# Patient Record
Sex: Male | Born: 1960 | Race: White | Hispanic: No | Smoking: Never smoker
Health system: Southern US, Community
[De-identification: ages and names within clinical notes are randomized; demographics above are authoritative.]

## PROBLEM LIST (undated history)

## (undated) DIAGNOSIS — G629 Polyneuropathy, unspecified: Secondary | ICD-10-CM

## (undated) DIAGNOSIS — E119 Type 2 diabetes mellitus without complications: Secondary | ICD-10-CM

## (undated) DIAGNOSIS — Z794 Long term (current) use of insulin: Secondary | ICD-10-CM

## (undated) DIAGNOSIS — N189 Chronic kidney disease, unspecified: Secondary | ICD-10-CM

## (undated) DIAGNOSIS — I10 Essential (primary) hypertension: Secondary | ICD-10-CM

## (undated) DIAGNOSIS — I639 Cerebral infarction, unspecified: Secondary | ICD-10-CM

## (undated) DIAGNOSIS — K219 Gastro-esophageal reflux disease without esophagitis: Secondary | ICD-10-CM

## (undated) HISTORY — PX: TONSILLECTOMY: SUR1361

## (undated) HISTORY — PX: APPENDECTOMY: SHX54

## (undated) HISTORY — PX: OTHER SURGICAL HISTORY: SHX169

## (undated) HISTORY — PX: BACK SURGERY: SHX140

---

## 2020-11-04 ENCOUNTER — Encounter (HOSPITAL_COMMUNITY): Payer: Self-pay

## 2020-11-04 ENCOUNTER — Emergency Department (HOSPITAL_COMMUNITY)
Admission: EM | Admit: 2020-11-04 | Discharge: 2020-11-05 | Disposition: A | Discharge status: Home / Self Care | Payer: Self-pay | Attending: Emergency Medicine | Admitting: Emergency Medicine

## 2020-11-04 ENCOUNTER — Other Ambulatory Visit: Payer: Self-pay

## 2020-11-04 DIAGNOSIS — L02214 Cutaneous abscess of groin: Secondary | ICD-10-CM

## 2020-11-04 DIAGNOSIS — E119 Type 2 diabetes mellitus without complications: Secondary | ICD-10-CM | POA: Insufficient documentation

## 2020-11-04 HISTORY — DX: Type 2 diabetes mellitus without complications: E11.9

## 2020-11-04 LAB — CBC
HCT: 44.3 % (ref 39.0–52.0)
Hemoglobin: 14.9 g/dL (ref 13.0–17.0)
MCH: 28.9 pg (ref 26.0–34.0)
MCHC: 33.6 g/dL (ref 30.0–36.0)
MCV: 85.9 fL (ref 80.0–100.0)
Platelets: 303 10*3/uL (ref 150–400)
RBC: 5.16 MIL/uL (ref 4.22–5.81)
RDW: 12.7 % (ref 11.5–15.5)
WBC: 8.6 10*3/uL (ref 4.0–10.5)
nRBC: 0 % (ref 0.0–0.2)

## 2020-11-04 LAB — COMPREHENSIVE METABOLIC PANEL
ALT: 12 U/L (ref 0–44)
AST: 15 U/L (ref 15–41)
Albumin: 3.6 g/dL (ref 3.5–5.0)
Alkaline Phosphatase: 88 U/L (ref 38–126)
Anion gap: 12 (ref 5–15)
BUN: 16 mg/dL (ref 6–20)
CO2: 23 mmol/L (ref 22–32)
Calcium: 9.3 mg/dL (ref 8.9–10.3)
Chloride: 101 mmol/L (ref 98–111)
Creatinine, Ser: 1.15 mg/dL (ref 0.61–1.24)
GFR, Estimated: >60 mL/min (ref >60)
Glucose, Bld: 247 mg/dL — ABNORMAL HIGH (ref 70–99)
Potassium: 4 mmol/L (ref 3.5–5.1)
Sodium: 136 mmol/L (ref 135–145)
Total Bilirubin: 1.5 mg/dL — ABNORMAL HIGH (ref 0.3–1.2)
Total Protein: 7.7 g/dL (ref 6.5–8.1)

## 2020-11-04 LAB — LIPASE, BLOOD: Lipase: 22 U/L (ref 11–51)

## 2020-11-04 LAB — CBG MONITORING, ED
Glucose-Capillary: 233 mg/dL — ABNORMAL HIGH (ref 70–99)
Glucose-Capillary: 229 mg/dL — ABNORMAL HIGH (ref 70–99)

## 2020-11-04 MED ORDER — OXYCODONE-ACETAMINOPHEN 5-325 MG PO TABS
1.0000 | ORAL_TABLET | Freq: Once | ORAL | Status: AC
  Administered 2020-11-04: 1 via ORAL
  Filled 2020-11-04: qty 1

## 2020-11-04 NOTE — ED Triage Notes (Signed)
Pt reports right sided groin pain for the past 2 days, states he thinks he has a hernia. Denies n/v/d.

## 2020-11-05 ENCOUNTER — Other Ambulatory Visit: Payer: Self-pay

## 2020-11-05 ENCOUNTER — Emergency Department (HOSPITAL_COMMUNITY): Payer: Self-pay

## 2020-11-05 LAB — URINALYSIS, ROUTINE W REFLEX MICROSCOPIC
Bacteria, UA: NONE SEEN
Bilirubin Urine: NEGATIVE
Glucose, UA: >=500 mg/dL — AB
Hgb urine dipstick: NEGATIVE
Ketones, ur: 20 mg/dL — AB
Leukocytes,Ua: NEGATIVE
Nitrite: NEGATIVE
Protein, ur: 100 mg/dL — AB
Specific Gravity, Urine: 1.023 (ref 1.005–1.030)
pH: 5 (ref 5.0–8.0)

## 2020-11-05 IMAGING — CT CT PELVIS W/ CM
2 of 3 series · 16 of 46 positions shown, 18 images · IV contrast (Omni 300)
Comparison: None.

CLINICAL DATA: Right groin pain and swelling.

EXAM:
CT PELVIS WITH CONTRAST
TECHNIQUE: Multidetector CT imaging of the pelvis was performed using the
standard protocol following the bolus administration of intravenous
contrast.
CONTRAST:  100mL OMNIPAQUE IOHEXOL 300 MG/ML  SOLN

[Series 3: pelvis with 5.0 · axial · 0.79mm/px · z∈[+845,+1115]mm · 13 of 62 slices shown, 15 images]
[im 4/62  soft-tissue]
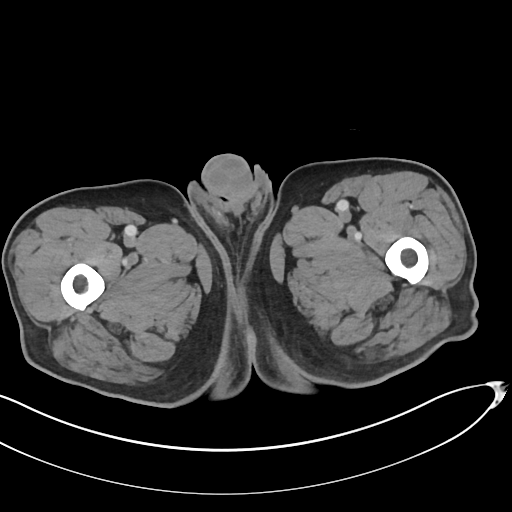
[im 4/62  bone]
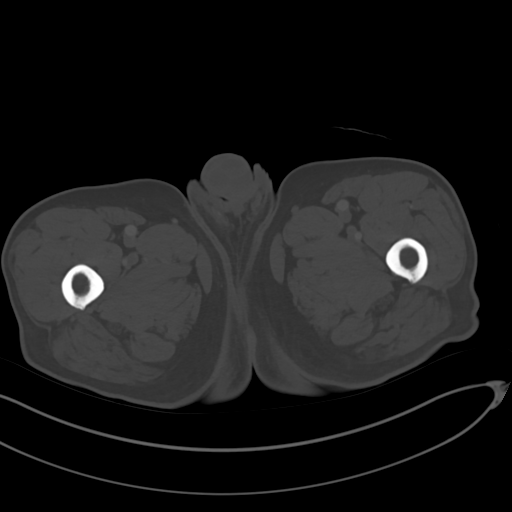
[im 8/62  soft-tissue]
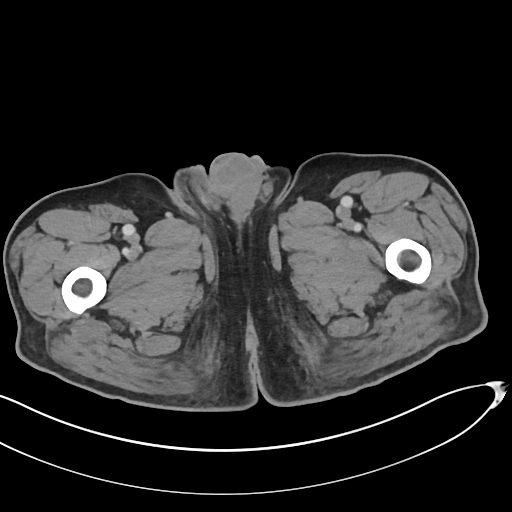
[im 12/62  soft-tissue]
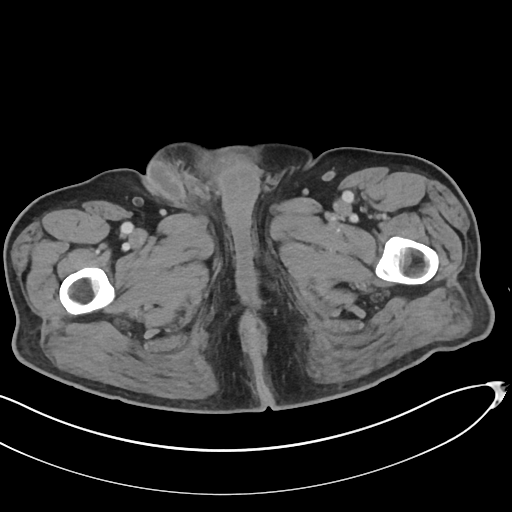
[im 18/62  soft-tissue]
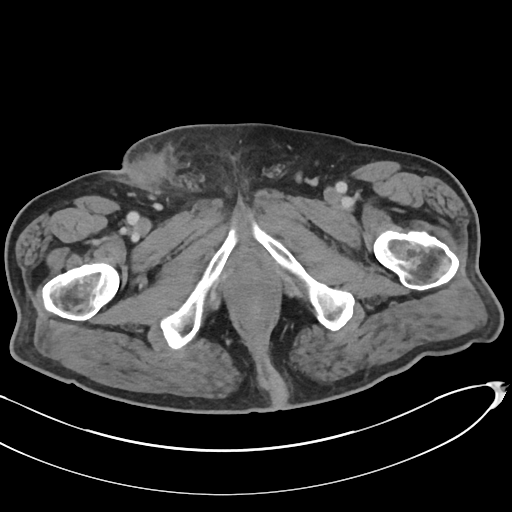
[im 22/62  soft-tissue]
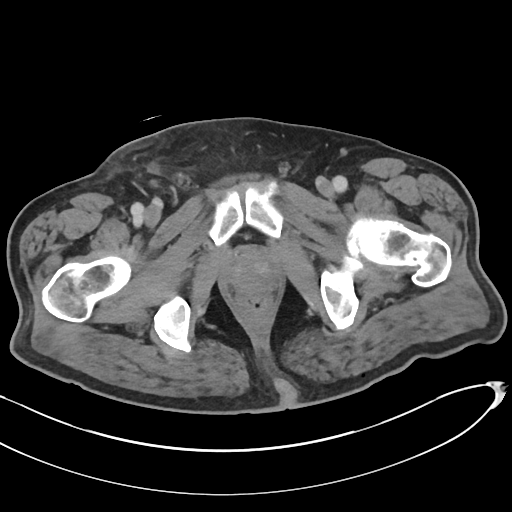
[im 26/62  soft-tissue]
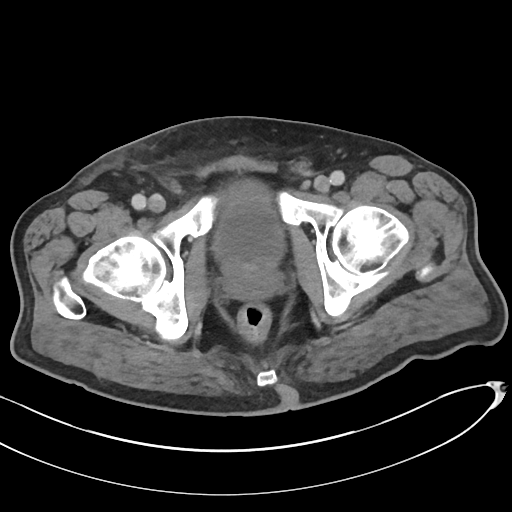
[im 32/62  soft-tissue]
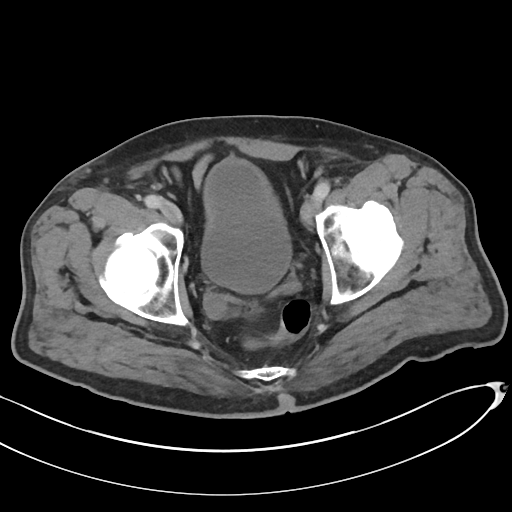
[im 36/62  soft-tissue]
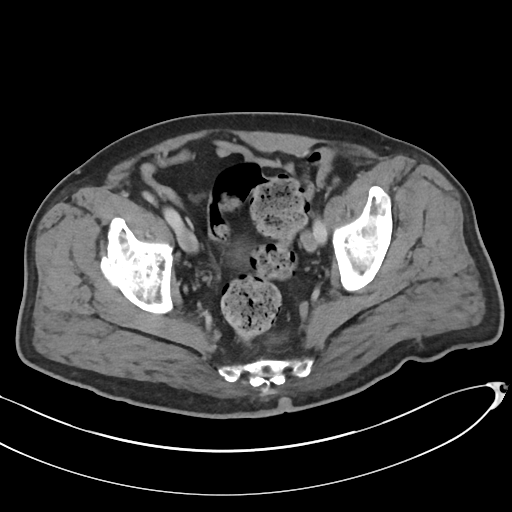
[im 40/62  soft-tissue]
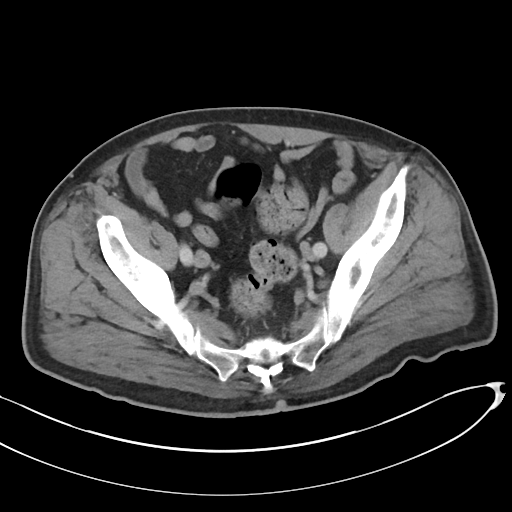
[im 40/62  bone]
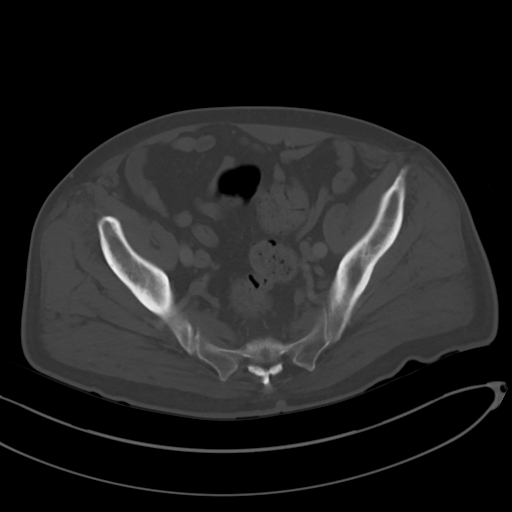
[im 44/62  soft-tissue]
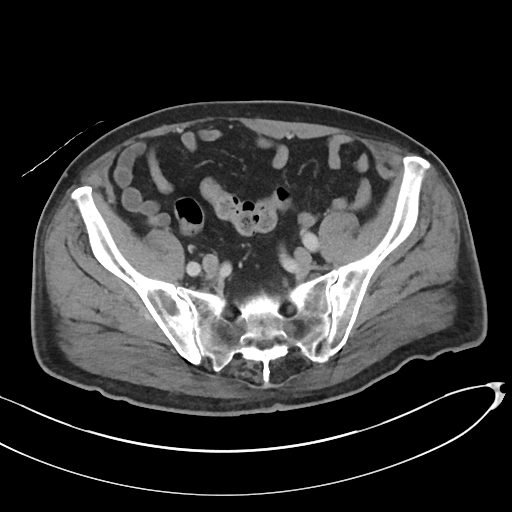
[im 50/62  soft-tissue]
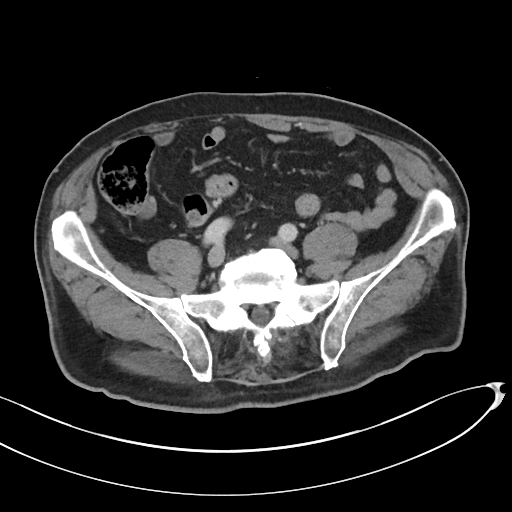
[im 54/62  soft-tissue]
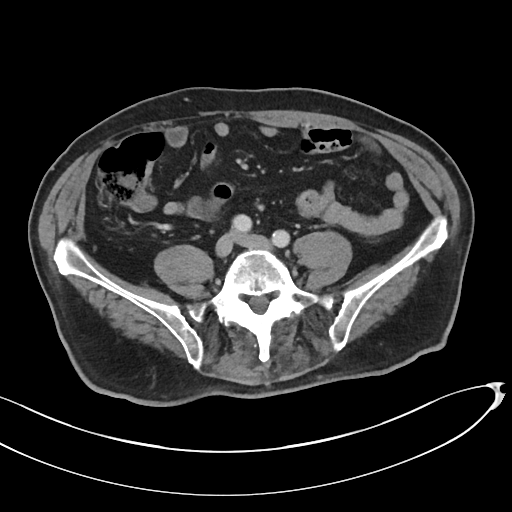
[im 58/62  soft-tissue]
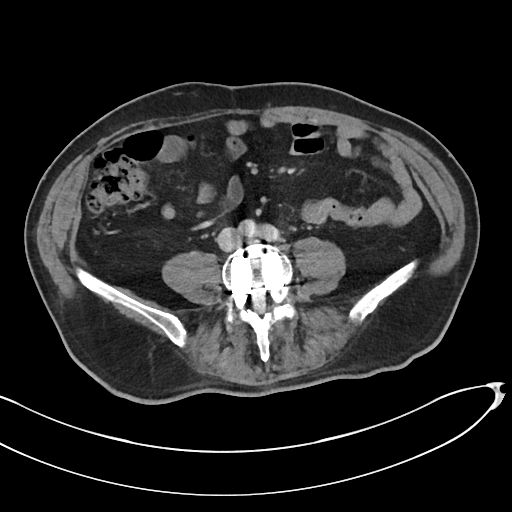

[Series 5: pelvis with 2.0 cor · coronal · 0.84mm/px · 3 of 151 slices shown]
[im 67/151  soft-tissue]
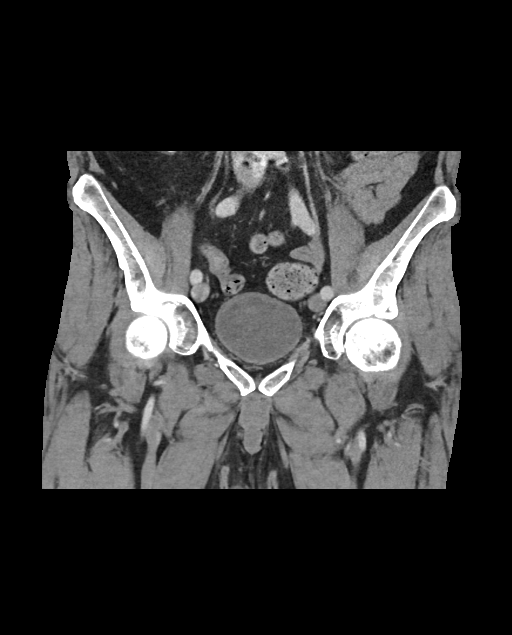
[im 84/151  soft-tissue]
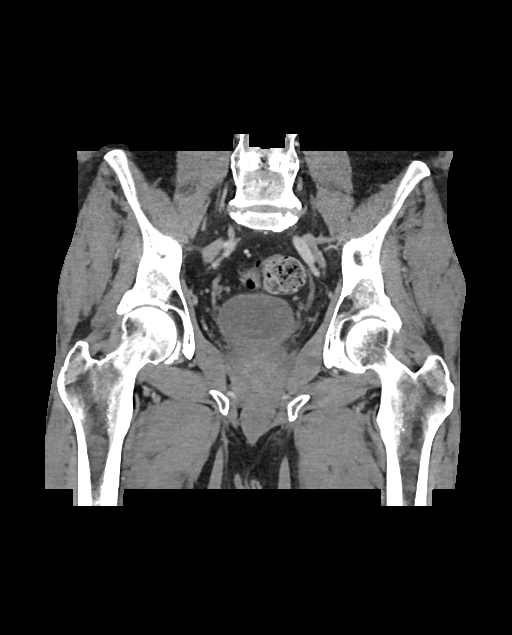
[im 101/151  soft-tissue]
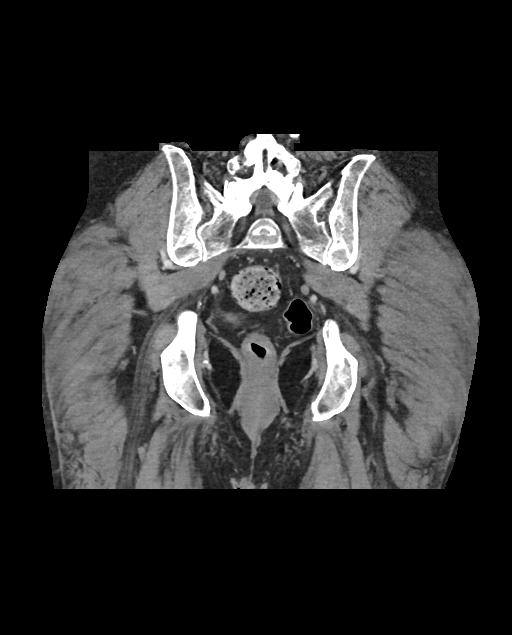

[16 of 46 positions shown; findings below may reference images not displayed]

FINDINGS: Urinary Tract:  No abnormality visualized.

Bowel:  Unremarkable visualized pelvic bowel loops.

Vascular/Lymphatic: No pathologically enlarged lymph nodes. No
significant vascular abnormality seen.

Reproductive:  No mass or other significant abnormality

Other: 3.9 x 3 x 5.4 cm complex peripherally enhancing fluid
collection in the right inguinal subcutaneous fat with severe
surrounding inflammatory changes most consistent with an abscess. No
inguinal hernia. No other fluid collection. No ascites.

Musculoskeletal: No acute osseous abnormality. No aggressive osseous
lesion. Interbody fusion at L4-5 with fusion of the posterior
elements. Degenerative disease with disc height loss at L5-S1 with
bilateral facet arthropathy and foraminal narrowing. Mild
osteoarthritis of bilateral hips.
IMPRESSION: 3.9 x 3 x 5.4 cm complex peripherally enhancing fluid collection in
the right inguinal subcutaneous fat with severe surrounding
inflammatory changes most consistent with an abscess and cellulitis.

## 2020-11-05 MED ORDER — HYDROCODONE-ACETAMINOPHEN 5-325 MG PO TABS
1.0000 | ORAL_TABLET | Freq: Four times a day (QID) | ORAL | 0 refills | Status: DC | PRN
Start: 2020-11-05 — End: 2021-02-03

## 2020-11-05 MED ORDER — LIDOCAINE-EPINEPHRINE 1 %-1:100000 IJ SOLN
10.0000 mL | Freq: Once | INTRAMUSCULAR | Status: AC
  Administered 2020-11-05: 10 mL
  Filled 2020-11-05: qty 1

## 2020-11-05 MED ORDER — DOXYCYCLINE HYCLATE 100 MG PO CAPS: 100.0000 mg | ORAL_CAPSULE | Freq: Two times a day (BID) | ORAL | 0 refills | Status: DC

## 2020-11-05 MED ORDER — IOHEXOL 300 MG/ML  SOLN
100.0000 mL | Freq: Once | INTRAMUSCULAR | Status: AC | PRN
  Administered 2020-11-05: 100 mL via INTRAVENOUS

## 2020-11-05 MED ORDER — HYDROMORPHONE HCL 1 MG/ML IJ SOLN
1.0000 mg | Freq: Once | INTRAMUSCULAR | Status: AC
  Administered 2020-11-05: 1 mg via INTRAMUSCULAR
  Filled 2020-11-05: qty 1

## 2020-11-05 MED ORDER — ONDANSETRON HCL 4 MG/2ML IJ SOLN
4.0000 mg | Freq: Once | INTRAMUSCULAR | Status: AC
  Administered 2020-11-05: 4 mg via INTRAVENOUS
  Filled 2020-11-05: qty 2

## 2020-11-05 MED ORDER — HYDROCODONE-ACETAMINOPHEN 5-325 MG PO TABS
1.0000 | ORAL_TABLET | Freq: Once | ORAL | Status: AC
  Administered 2020-11-05: 1 via ORAL
  Filled 2020-11-05: qty 1

## 2020-11-05 MED ORDER — HYDROMORPHONE HCL 1 MG/ML IJ SOLN
1.0000 mg | Freq: Once | INTRAMUSCULAR | Status: AC
  Administered 2020-11-05: 1 mg via INTRAVENOUS
  Filled 2020-11-05: qty 1

## 2020-11-05 MED ORDER — IBUPROFEN 600 MG PO TABS: 600.0000 mg | ORAL_TABLET | Freq: Four times a day (QID) | ORAL | 0 refills | Status: DC | PRN

## 2020-11-05 NOTE — Discharge Instructions (Addendum)
You were seen in the emergency department for a skin abscess- please see the attached handout for further information regarding this diagnoses. This area was incised and drained to help release the bacteria. We would like you to apply warm compresses and warm flushes to this area 4-5 times per day to help facilitate further draining as needed.   Medications: Doxycycline (antibiotic) - take twice daily for 7 days, complete entire course even if symptoms are resolved  To treat pain you may use ibuprofen 600 mg every 6 hours and Norco for breakthrough pain, this is a narcotic pain medication please use with caution.  We have prescribed you new medication(s) today. Discuss the medications prescribed today with your pharmacist as they can have adverse effects and interactions with your other medicines including over the counter and prescribed medications. Seek medical evaluation if you start to experience new or abnormal symptoms after taking one of these medicines, seek care immediately if you start to experience difficulty breathing, feeling of your throat closing, facial swelling, or rash as these could be indications of a more serious allergic reaction  We would like you to have this area rechecked within 48 hours- please return to the ER , go to an urgent care, or see your primary care provider for this. Return to the ER sooner for new or worsening symptoms including, but not limited to increased pain, spreading redness, fevers, inability to keep fluids down, or any other concerns that you may have.

## 2020-11-05 NOTE — ED Provider Notes (Signed)
[facility] EMERGENCY DEPARTMENT Provider Note   CSN: [id number] Arrival date & time: 11/04/20  1356     History Chief Complaint  Patient presents with  . Groin Pain    Brett White is a 60 y.o. male.  Brett White is a 60 y.o. male with hx of diabetes and stroke, who presents with 3 days of worsening right groin pain and swelling.  Patient reports he noticed a small in the right groin that has become progressively more swollen and painful.  Reports he had 2 episodes of vomiting 3 days ago but none since then.  Last bowel movement was 24 hours ago, and was normal, he has been able to pass some gas.  Denies any dysuria or urinary frequency.  No pain or swelling in the penis or testicles.  No fevers.  No chest pain or shortness of breath.  Patient does report one started had a "cyst" that had to be opened up and drained with packing in similar area and this feels somewhat similar to that.  He has not noted any drainage from this area.  Previous appendectomy, no other abdominal surgeries.  No medications prior to arrival.  No other aggravating or alleviating factors.         Past Medical History:  Diagnosis Date  . Diabetes mellitus without complication (HCC)     There are no problems to display for this patient.   Past Surgical History:  Procedure Laterality Date  . APPENDECTOMY    . BACK SURGERY    . TONSILLECTOMY         No family history on file.     Home Medications Prior to Admission medications   Not on File    Allergies    Patient has no allergy information on record.  Review of Systems   Review of Systems  Constitutional: Negative for chills and fever.  HENT: Negative.   Respiratory: Negative for cough and shortness of breath.   Cardiovascular: Negative for chest pain.  Gastrointestinal: Positive for nausea and vomiting. Negative for abdominal pain, blood in stool, constipation and diarrhea.  Genitourinary: Negative for dysuria,  flank pain, frequency, hematuria, penile discharge, penile pain, penile swelling, scrotal swelling and testicular pain.       R groin pain  Musculoskeletal: Negative for arthralgias and myalgias.  Skin: Negative for color change and rash.  Neurological: Negative for dizziness, syncope and light-headedness.  All other systems reviewed and are negative.   Physical Exam Updated Vital Signs BP (!) 152/97 (BP Location: Right Arm)   Pulse 86   Temp 98.2 F (36.8 C) (Oral)   Resp 16   SpO2 98%   Physical Exam Vitals and nursing note reviewed. Exam conducted with a chaperone present.  Constitutional:      General: He is not in acute distress.    Appearance: Normal appearance. He is well-developed and well-nourished. He is not ill-appearing or diaphoretic.     Comments: Patient is alert, appears uncomfortable but is in no acute distress.  HENT:     Head: Normocephalic and atraumatic.     Mouth/Throat:     Mouth: Oropharynx is clear and moist. Mucous membranes are moist.     Pharynx: Oropharynx is clear.  Eyes:     General:        Right eye: No discharge.        Left eye: No discharge.     Extraocular Movements: EOM normal.  Cardiovascular:     Rate  and Rhythm: Normal rate and regular rhythm.     Pulses: Normal pulses and intact distal pulses.     Heart sounds: Normal heart sounds. No murmur heard. No friction rub. No gallop.   Pulmonary:     Effort: Pulmonary effort is normal. No respiratory distress.     Breath sounds: Normal breath sounds. No wheezing or rales.     Comments: Respirations equal and unlabored, patient able to speak in full sentences, lungs clear to auscultation bilaterally  Abdominal:     General: Bowel sounds are normal. There is no distension.     Palpations: Abdomen is soft. There is no mass.     Tenderness: There is no abdominal tenderness. There is no guarding.     Comments: Abdomen soft, nondistended, nontender to palpation in all quadrants without guarding  or peritoneal signs  Genitourinary:    Penis: Normal.      Testes:        Right: Mass, tenderness or swelling not present.        Left: Mass, tenderness or swelling not present.       Comments: Penis and testes normal with no swelling, tenderness or masses.  Right groin pain and swelling as noted above.  No palpable lymphadenopathy. Musculoskeletal:        General: No deformity or edema.     Cervical back: Neck supple.  Skin:    General: Skin is warm and dry.     Capillary Refill: Capillary refill takes less than 2 seconds.  Neurological:     Mental Status: He is alert.     Coordination: Coordination normal.     Comments: Speech is clear, able to follow commands Moves extremities without ataxia, coordination intact  Psychiatric:        Mood and Affect: Mood normal.        Behavior: Behavior normal.     ED Results / Procedures / Treatments   Labs (all labs ordered are listed, but only abnormal results are displayed) Labs Reviewed  COMPREHENSIVE METABOLIC PANEL - Abnormal; Notable for the following components:      Result Value   Glucose, Bld 247 (*)    Total Bilirubin 1.5 (*)    All other components within normal limits  CBG MONITORING, ED - Abnormal; Notable for the following components:   Glucose-Capillary 233 (*)    All other components within normal limits  CBG MONITORING, ED - Abnormal; Notable for the following components:   Glucose-Capillary 229 (*)    All other components within normal limits  LIPASE, BLOOD  CBC  URINALYSIS, ROUTINE W REFLEX MICROSCOPIC    EKG None  Radiology CT PELVIS W CONTRAST  Result Date: 11/05/2020 CLINICAL DATA:  Right groin pain and swelling. EXAM: CT PELVIS WITH CONTRAST TECHNIQUE: Multidetector CT imaging of the pelvis was performed using the standard protocol following the bolus administration of intravenous contrast. CONTRAST:  OMNIPAQUE IOHEXOL 300 MG/ML  SOLN COMPARISON:  None. FINDINGS: Urinary Tract:  No abnormality  visualized. Bowel:  Unremarkable visualized pelvic bowel loops. Vascular/Lymphatic: No pathologically enlarged lymph nodes. No significant vascular abnormality seen. Reproductive:  No mass or other significant abnormality Other: 3.9 x 3 x 5.4 cm complex peripherally enhancing fluid collection in the right inguinal subcutaneous fat with severe surrounding inflammatory changes most consistent with an abscess. No inguinal hernia. No other fluid collection. No ascites. Musculoskeletal: No acute osseous abnormality. No aggressive osseous lesion. Interbody fusion at L4-5 with fusion of the posterior elements. Degenerative disease  with disc height loss at L5-S1 with bilateral facet arthropathy and foraminal narrowing. Mild osteoarthritis of bilateral hips. IMPRESSION: 3.9 x 3 x 5.4 cm complex peripherally enhancing fluid collection in the right inguinal subcutaneous fat with severe surrounding inflammatory changes most consistent with an abscess and cellulitis. Electronically Signed   By: Elige Ko   On: 11/05/2020 13:00    Procedures .Marland KitchenIncision and Drainage  Date/Time: 11/05/2020 1:48 PM Performed by: Dartha Lodge, PA-C Authorized by: Dartha Lodge, PA-C   Consent:    Consent obtained:  Verbal   Consent given by:  Patient   Risks, benefits, and alternatives were discussed: yes     Risks discussed:  Bleeding, incomplete drainage, pain, infection and damage to other organs   Alternatives discussed:  No treatment Universal protocol:    Procedure explained and questions answered to patient or proxy's satisfaction: yes     Patient identity confirmed:  Verbally with patient Location:    Type:  Abscess   Size:  3.9 x 3 x 5.4 cm   Location:  Anogenital   Anogenital location: R groin. Pre-procedure details:    Skin preparation:  Povidone-iodine Sedation:    Sedation type:  None Anesthesia:    Anesthesia method:  Local infiltration   Local anesthetic:  Lidocaine 2% WITH epi Procedure type:     Complexity:  Complex Procedure details:    Ultrasound guidance: no     Needle aspiration: no     Incision types:  Single straight   Incision depth:  Subcutaneous   Wound management:  Probed and deloculated and irrigated with saline   Drainage:  Bloody and purulent   Drainage amount:  Copious   Wound treatment:  Wound left open   Packing materials:  1/4 in iodoform gauze Post-procedure details:    Procedure completion:  Tolerated well, no immediate complications     Medications Ordered in ED Medications  oxyCODONE-acetaminophen (PERCOCET/ROXICET) 5-325 MG per tablet 1 tablet (1 tablet Oral Given 11/04/20 1420)  HYDROmorphone (DILAUDID) injection 1 mg (1 mg Intravenous Given 11/05/20 1017)  ondansetron (ZOFRAN) injection 4 mg (4 mg Intravenous Given 11/05/20 1017)  HYDROmorphone (DILAUDID) injection 1 mg (1 mg Intramuscular Given 11/05/20 1130)    ED Course  I have reviewed the triage vital signs and the nursing notes.  Pertinent labs & imaging results that were available during my care of the patient were reviewed by me and considered in my medical decision making (see chart for details).    MDM Rules/Calculators/A&P                         60 year old male presents with right groin pain and swelling, present and worsening for 3 days.  No reported fevers at home, low-grade temp on arrival, hypertensive but vitals otherwise stable.  He has swelling with small area of redness present in the right groin, this area is very tender to palpation, concern for potential hernia, abscess or soft tissue mass.  No involvement of the penis or scrotum.  No associated abdominal tenderness.  Bedside ultrasound without clear evidence of localized fluid collection amenable to I&D, will get CT scan of the pelvis for further evaluation.  Pain and nausea medication given for symptomatic management.  Lab work obtained from triage which I have personally reviewed and interpreted: CBC: No leukocytosis, normal  hemoglobin CMP: Glucose 247 but no other electrolyte derangements, normal renal and liver function with the exception of mildly elevatedT bili, no  prior available for comparison Lipase: WNL UA pending  CT shows a 3.9 x 3 x 5.4 cm complex peripherally enhancing fluid collection in the right inguinal subcutaneous fat with severe surrounding inflammatory changes most consistent with an abscess and cellulitis.   Feel that abscess is amenable to incision and drainage here in the ED.  The area was cleaned and anesthetized and a straight incision was made, copious amounts of bloody purulent drainage expressed, the area was probed and loculated and rinsed with saline. 1/4 inch iodoform gauze placed for packing given size of abscess.  Given findings of cellulitis on CT as well as patient's diabetes history, will also treat with antibiotics, given first dose of doxycycline here in the ED and prescribed 1 week course.  We will have patient return in 2 days for wound check and packing removal.  Encourage warm soaks and discussed appropriate wound care and return precautions.  Patient expresses understanding and agreement with plan.  Discharged home in good condition.   Final Clinical Impression(s) / ED Diagnoses Final diagnoses:  Abscess of groin, right    Rx / DC Orders ED Discharge Orders         Ordered    doxycycline (VIBRAMYCIN) 100 MG capsule  2 times daily        11/05/20 1347    ibuprofen (ADVIL) 600 MG tablet  Every 6 hours PRN        11/05/20 1347    HYDROcodone-acetaminophen (NORCO) 5-325 MG tablet  Every 6 hours PRN        11/05/20 1347           Dartha Lodge, PA-C 11/05/20 1352    Rolan Bucco, MD 11/05/20 1436

## 2020-11-05 NOTE — ED Notes (Signed)
Ask pt for urine sample, pt stated he did not need to urinate at this time.  

## 2020-11-05 NOTE — ED Notes (Signed)
Patient transported to CT 

## 2020-11-08 ENCOUNTER — Emergency Department (HOSPITAL_COMMUNITY)
Admission: EM | Admit: 2020-11-08 | Discharge: 2020-11-08 | Disposition: A | Discharge status: Home / Self Care | Payer: Self-pay | Attending: Emergency Medicine | Admitting: Emergency Medicine

## 2020-11-08 ENCOUNTER — Other Ambulatory Visit: Payer: Self-pay

## 2020-11-08 ENCOUNTER — Encounter (HOSPITAL_COMMUNITY): Payer: Self-pay | Admitting: Emergency Medicine

## 2020-11-08 DIAGNOSIS — Z4801 Encounter for change or removal of surgical wound dressing: Secondary | ICD-10-CM | POA: Insufficient documentation

## 2020-11-08 DIAGNOSIS — Z5189 Encounter for other specified aftercare: Secondary | ICD-10-CM

## 2020-11-08 DIAGNOSIS — Z7984 Long term (current) use of oral hypoglycemic drugs: Secondary | ICD-10-CM | POA: Insufficient documentation

## 2020-11-08 DIAGNOSIS — L02214 Cutaneous abscess of groin: Secondary | ICD-10-CM | POA: Insufficient documentation

## 2020-11-08 DIAGNOSIS — E119 Type 2 diabetes mellitus without complications: Secondary | ICD-10-CM | POA: Insufficient documentation

## 2020-11-08 LAB — CBC WITH DIFFERENTIAL/PLATELET
Abs Immature Granulocytes: 0.02 10*3/uL (ref 0.00–0.07)
Basophils Absolute: 0 10*3/uL (ref 0.0–0.1)
Basophils Relative: 0 %
Eosinophils Absolute: 0.1 10*3/uL (ref 0.0–0.5)
Eosinophils Relative: 1 %
HCT: 43.8 % (ref 39.0–52.0)
Hemoglobin: 14.4 g/dL (ref 13.0–17.0)
Immature Granulocytes: 0 %
Lymphocytes Relative: 24 %
Lymphs Abs: 1.4 10*3/uL (ref 0.7–4.0)
MCH: 28.7 pg (ref 26.0–34.0)
MCHC: 32.9 g/dL (ref 30.0–36.0)
MCV: 87.4 fL (ref 80.0–100.0)
Monocytes Absolute: 0.3 10*3/uL (ref 0.1–1.0)
Monocytes Relative: 5 %
Neutro Abs: 4.2 10*3/uL (ref 1.7–7.7)
Neutrophils Relative %: 70 %
Platelets: 281 10*3/uL (ref 150–400)
RBC: 5.01 MIL/uL (ref 4.22–5.81)
RDW: 12.3 % (ref 11.5–15.5)
WBC: 6 10*3/uL (ref 4.0–10.5)
nRBC: 0 % (ref 0.0–0.2)

## 2020-11-08 LAB — BASIC METABOLIC PANEL
Anion gap: 13 (ref 5–15)
BUN: 8 mg/dL (ref 6–20)
CO2: 22 mmol/L (ref 22–32)
Calcium: 8.7 mg/dL — ABNORMAL LOW (ref 8.9–10.3)
Chloride: 100 mmol/L (ref 98–111)
Creatinine, Ser: 0.99 mg/dL (ref 0.61–1.24)
GFR, Estimated: >60 mL/min (ref >60)
Glucose, Bld: 396 mg/dL — ABNORMAL HIGH (ref 70–99)
Potassium: 3.8 mmol/L (ref 3.5–5.1)
Sodium: 135 mmol/L (ref 135–145)

## 2020-11-08 MED ORDER — HYDROMORPHONE HCL 1 MG/ML IJ SOLN
1.0000 mg | Freq: Once | INTRAMUSCULAR | Status: AC
  Administered 2020-11-08: 1 mg via INTRAVENOUS
  Filled 2020-11-08: qty 1

## 2020-11-08 MED ORDER — ONDANSETRON HCL 4 MG/2ML IJ SOLN
4.0000 mg | Freq: Once | INTRAMUSCULAR | Status: AC
  Administered 2020-11-08: 4 mg via INTRAVENOUS
  Filled 2020-11-08: qty 2

## 2020-11-08 MED ORDER — OXYCODONE-ACETAMINOPHEN 5-325 MG PO TABS: 1.0000 | ORAL_TABLET | Freq: Four times a day (QID) | ORAL | 0 refills | Status: DC | PRN

## 2020-11-08 NOTE — ED Notes (Signed)
Pt removed him self from monitor states provider told him he will be dc soon.

## 2020-11-08 NOTE — ED Triage Notes (Signed)
Pt was seen here on 2/4 for a abscess on right side of his groin that he states was lanced and packed. He was started on an antibiotic. The packing as fallen out and he is having increased pain and swelling to the area.

## 2020-11-08 NOTE — ED Provider Notes (Signed)
[facility] EMERGENCY DEPARTMENT Provider Note   CSN: [id number] Arrival date & time: 11/08/20  1433     History Chief Complaint  Patient presents with  . Wound Check    Brett White is a 60 y.o. male past medical history of type 2 diabetes, presenting to the emergency department with worsening pain and swelling to abscess in right groin. Per review of medical record, he was evaluated on 11/05/2020 where he had abscess drained and packed. He was started on doxycycline. He had CT abdomen pelvis done at the time which showed 4 x 3 x 5.5 cm abscess with severe cellulitis in the right groin. He was evaluated by his PCP today for recheck and sent to the ED for further evaluation. He states the pain has significantly worsened, long with the swelling. It is still draining. The packing fell out last night. He is compliant with the antibiotics, has not missed any doses. The Norco prescribed is not providing adequate pain relief. Denies fevers or chills. Reports history of abscess on his right shoulder that required admission for 1 month duration and surgical drainage. He feels similar.  Denies history of IV drug use or HIV.  The history is provided by the patient and medical records.       Past Medical History:  Diagnosis Date  . Diabetes mellitus without complication (HCC)     There are no problems to display for this patient.   Past Surgical History:  Procedure Laterality Date  . APPENDECTOMY    . BACK SURGERY    . TONSILLECTOMY         No family history on file.     Home Medications Prior to Admission medications   Medication Sig Start Date End Date Taking? Authorizing Provider  oxyCODONE-acetaminophen (PERCOCET/ROXICET) 5-325 MG tablet Take 1-2 tablets by mouth every 6 (six) hours as needed for severe pain. 11/08/20  Yes Roxan Hockey, [place] N, PA-C  diphenhydramine-acetaminophen (TYLENOL PM) 25-500 MG TABS tablet Take 2 tablets by mouth at bedtime as needed.     [provider]  doxycycline (VIBRAMYCIN) 100 MG capsule Take 1 capsule (100 mg total) by mouth 2 (two) times daily. One po bid x 7 days 11/05/20   Dartha Lodge, PA-C  HYDROcodone-acetaminophen [facility]) 5-325 MG tablet Take 1-2 tablets by mouth every 6 (six) hours as needed. 11/05/20   Dartha Lodge, PA-C  ibuprofen (ADVIL) 600 MG tablet Take 1 tablet (600 mg total) by mouth every 6 (six) hours as needed. 11/05/20   Dartha Lodge, PA-C  metFORMIN (GLUCOPHAGE) 500 MG tablet Take 1,000 mg by mouth 2 (two) times daily with a meal.    [provider]    Allergies    Patient has no known allergies.  Review of Systems   Review of Systems  Constitutional: Negative for chills and fever.  Genitourinary: Positive for scrotal swelling. Negative for testicular pain.  Skin: Positive for color change and wound.    Physical Exam Updated Vital Signs BP (!) 176/104   Pulse 88   Temp 98 F (36.7 C) (Oral)   Resp 16   Ht 6\' 3"  (1.905 m)   Wt 97.5 kg   SpO2 96%   BMI 26.87 kg/m   Physical Exam Vitals and nursing note reviewed.  Constitutional:      Appearance: He is well-developed and well-nourished.  HENT:     Head: Normocephalic and atraumatic.  Eyes:     Conjunctiva/sclera: Conjunctivae normal.  Cardiovascular:  Rate and Rhythm: Normal rate and regular rhythm.  Pulmonary:     Effort: Pulmonary effort is normal.     Breath sounds: Normal breath sounds.  Abdominal:     General: Bowel sounds are normal.     Palpations: Abdomen is soft.     Tenderness: There is no abdominal tenderness.  Genitourinary:    Comments: Exam performed with male RN chaperone present.  Right groin with draining abscess present.  The incision still appears open.  The abscess is about 3 cm in diameter.  Mild surrounding swelling, no surrounding signs of induration.  Swelling does not extend into the thigh or the scrotum. Skin:    General: Skin is warm.  Neurological:     Mental Status: He is  alert.  Psychiatric:        Mood and Affect: Mood and affect normal.        Behavior: Behavior normal.      ED Results / Procedures / Treatments   Labs (all labs ordered are listed, but only abnormal results are displayed) Labs Reviewed  BASIC METABOLIC PANEL - Abnormal; Notable for the following components:      Result Value   Glucose, Bld 396 (*)    Calcium 8.7 (*)    All other components within normal limits  CBC WITH DIFFERENTIAL/PLATELET    EKG None  Radiology No results found.  Procedures Procedures   Medications Ordered in ED Medications  HYDROmorphone (DILAUDID) injection 1 mg (1 mg Intravenous Given 11/08/20 1649)  ondansetron (ZOFRAN) injection 4 mg (4 mg Intravenous Given 11/08/20 1649)    ED Course  I have reviewed the triage vital signs and the nursing notes.  Pertinent labs & imaging results that were available during my care of the patient were reviewed by me and considered in my medical decision making (see chart for details).    MDM Rules/Calculators/A&P                          Patient is here for wound recheck of right groin abscess that was drained in the ED on 09/04/2021.  Packing was placed at the time, he states it fell out yesterday.  He has been compliant with doxycycline.  No systemic symptoms.  He is afebrile here without leukocytosis.  Initial provider, Gwinda Passe, was able to reevaluate the abscess today in the ED and reports significant improvement since initial evaluation a few days ago.  Patient may require some additional pain medication for symptom management.  Recommend continue warm water soaks and compresses, continue antibiotic, recheck by PCP.  Return for systemic symptoms.  Patient discussed with and evaluated by attending physician Dr. Bernette Mayers, who agrees with work-up and care plan for discharge.  Discussed results, findings, treatment and follow up. Patient advised of return precautions. Patient verbalized understanding and agreed  with plan.  [place] Controlled Substance reporting System queried  Final Clinical Impression(s) / ED Diagnoses Final diagnoses:  Wound check, abscess    Rx / DC Orders ED Discharge Orders         Ordered    oxyCODONE-acetaminophen (PERCOCET/ROXICET) 5-325 MG tablet  Every 6 hours PRN        11/08/20 1838           Vera Furniss, [place] N, PA-C 11/08/20 Randel Pigg, MD 11/08/20 [phone]

## 2020-11-08 NOTE — Discharge Instructions (Addendum)
Continue taking the antibiotic, as directed, until gone. You can take the percocet every 6 hours as needed for severe pain. Continue taking the ibuprofen every 6 hours. Do warm water soaks a couple of times per day to encourage continued drainage of your abscess. Follow up with primary care in 2 days. Return if fevers or worsening swelling/redness.

## 2020-11-25 ENCOUNTER — Encounter (HOSPITAL_COMMUNITY): Payer: Self-pay | Admitting: *Deleted

## 2020-11-25 ENCOUNTER — Other Ambulatory Visit: Payer: Self-pay

## 2020-11-25 ENCOUNTER — Emergency Department (HOSPITAL_COMMUNITY): Payer: Medicare HMO

## 2020-11-25 ENCOUNTER — Emergency Department (HOSPITAL_COMMUNITY)
Admission: EM | Admit: 2020-11-25 | Discharge: 2020-11-26 | Disposition: A | Discharge status: Home / Self Care | Payer: Medicare HMO | Attending: Emergency Medicine | Admitting: Emergency Medicine

## 2020-11-25 DIAGNOSIS — E1165 Type 2 diabetes mellitus with hyperglycemia: Secondary | ICD-10-CM | POA: Insufficient documentation

## 2020-11-25 DIAGNOSIS — R519 Headache, unspecified: Secondary | ICD-10-CM | POA: Diagnosis not present

## 2020-11-25 DIAGNOSIS — R55 Syncope and collapse: Secondary | ICD-10-CM | POA: Diagnosis present

## 2020-11-25 DIAGNOSIS — Y92002 Bathroom of unspecified non-institutional (private) residence single-family (private) house as the place of occurrence of the external cause: Secondary | ICD-10-CM | POA: Insufficient documentation

## 2020-11-25 DIAGNOSIS — M25531 Pain in right wrist: Secondary | ICD-10-CM | POA: Diagnosis not present

## 2020-11-25 DIAGNOSIS — R030 Elevated blood-pressure reading, without diagnosis of hypertension: Secondary | ICD-10-CM | POA: Diagnosis not present

## 2020-11-25 DIAGNOSIS — Z7984 Long term (current) use of oral hypoglycemic drugs: Secondary | ICD-10-CM | POA: Insufficient documentation

## 2020-11-25 DIAGNOSIS — E860 Dehydration: Secondary | ICD-10-CM | POA: Diagnosis not present

## 2020-11-25 DIAGNOSIS — W19XXXA Unspecified fall, initial encounter: Secondary | ICD-10-CM | POA: Insufficient documentation

## 2020-11-25 DIAGNOSIS — R040 Epistaxis: Secondary | ICD-10-CM | POA: Insufficient documentation

## 2020-11-25 LAB — URINALYSIS, ROUTINE W REFLEX MICROSCOPIC
Bacteria, UA: NONE SEEN
Bilirubin Urine: NEGATIVE
Glucose, UA: >=500 mg/dL — AB
Hgb urine dipstick: NEGATIVE
Ketones, ur: NEGATIVE mg/dL
Leukocytes,Ua: NEGATIVE
Nitrite: NEGATIVE
Protein, ur: NEGATIVE mg/dL
Specific Gravity, Urine: 1.029 (ref 1.005–1.030)
pH: 5 (ref 5.0–8.0)

## 2020-11-25 LAB — COMPREHENSIVE METABOLIC PANEL WITH GFR
ALT: 10 U/L (ref 0–44)
AST: 14 U/L — ABNORMAL LOW (ref 15–41)
Albumin: 3.7 g/dL (ref 3.5–5.0)
Alkaline Phosphatase: 95 U/L (ref 38–126)
Anion gap: 11 (ref 5–15)
BUN: 18 mg/dL (ref 6–20)
CO2: 24 mmol/L (ref 22–32)
Calcium: 9.2 mg/dL (ref 8.9–10.3)
Chloride: 97 mmol/L — ABNORMAL LOW (ref 98–111)
Creatinine, Ser: 1.3 mg/dL — ABNORMAL HIGH (ref 0.61–1.24)
GFR, Estimated: >60 mL/min
Glucose, Bld: 573 mg/dL (ref 70–99)
Potassium: 4.7 mmol/L (ref 3.5–5.1)
Sodium: 132 mmol/L — ABNORMAL LOW (ref 135–145)
Total Bilirubin: 0.5 mg/dL (ref 0.3–1.2)
Total Protein: 7.3 g/dL (ref 6.5–8.1)

## 2020-11-25 LAB — CBC
HCT: 46.5 % (ref 39.0–52.0)
Hemoglobin: 15 g/dL (ref 13.0–17.0)
MCH: 28.1 pg (ref 26.0–34.0)
MCHC: 32.3 g/dL (ref 30.0–36.0)
MCV: 87.1 fL (ref 80.0–100.0)
Platelets: 276 K/uL (ref 150–400)
RBC: 5.34 MIL/uL (ref 4.22–5.81)
RDW: 12.1 % (ref 11.5–15.5)
WBC: 6.6 K/uL (ref 4.0–10.5)
nRBC: 0 % (ref 0.0–0.2)

## 2020-11-25 LAB — CBG MONITORING, ED
Glucose-Capillary: 546 mg/dL (ref 70–99)
Glucose-Capillary: 349 mg/dL — ABNORMAL HIGH (ref 70–99)

## 2020-11-25 IMAGING — DX DG WRIST COMPLETE 3+V*R*
4 series · 4 of 4 positions shown · non-contrast
Comparison: None.

CLINICAL DATA: Fall wrist pain

EXAM:
RIGHT WRIST - COMPLETE 3+ VIEW

[wrist pa]
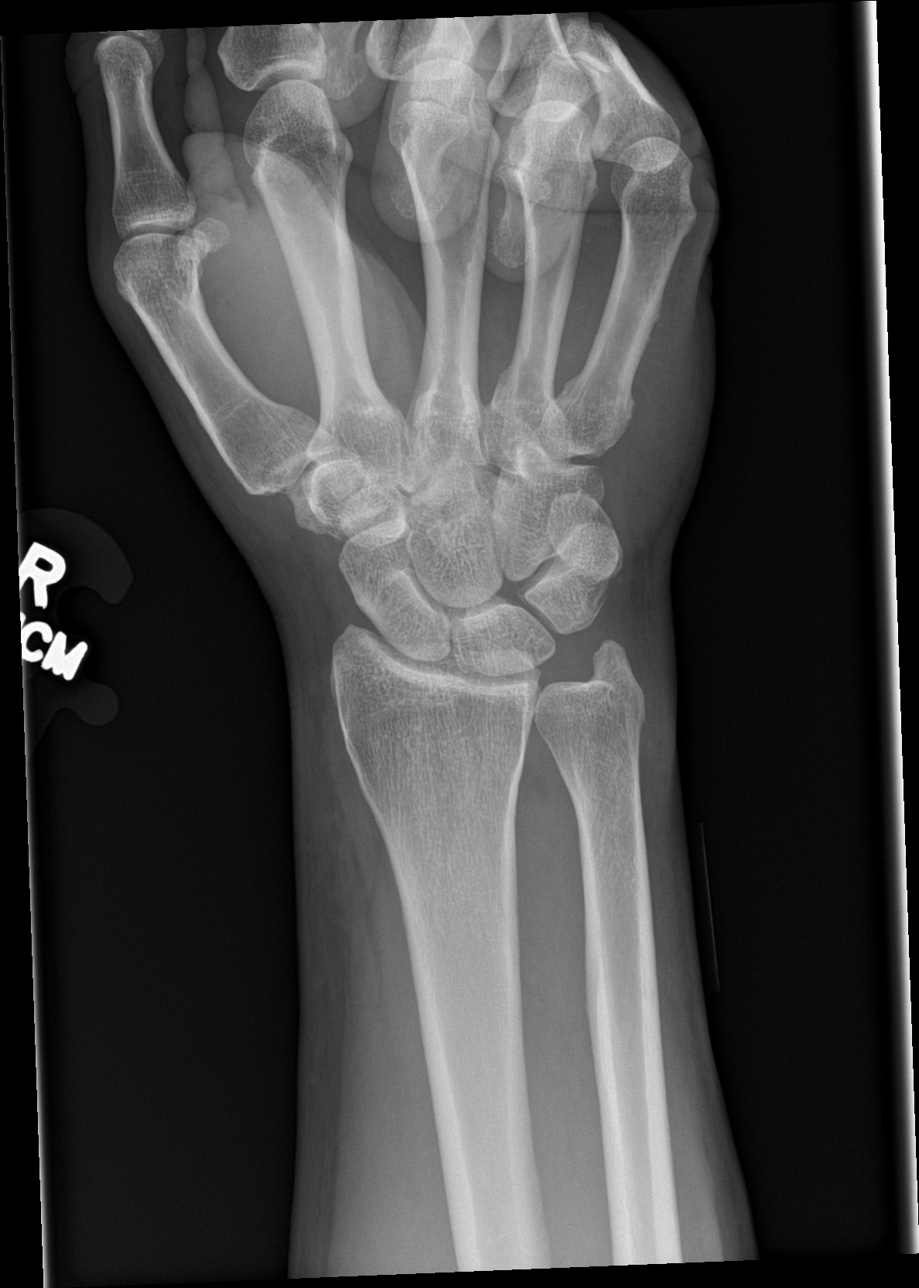

[wrist obl]
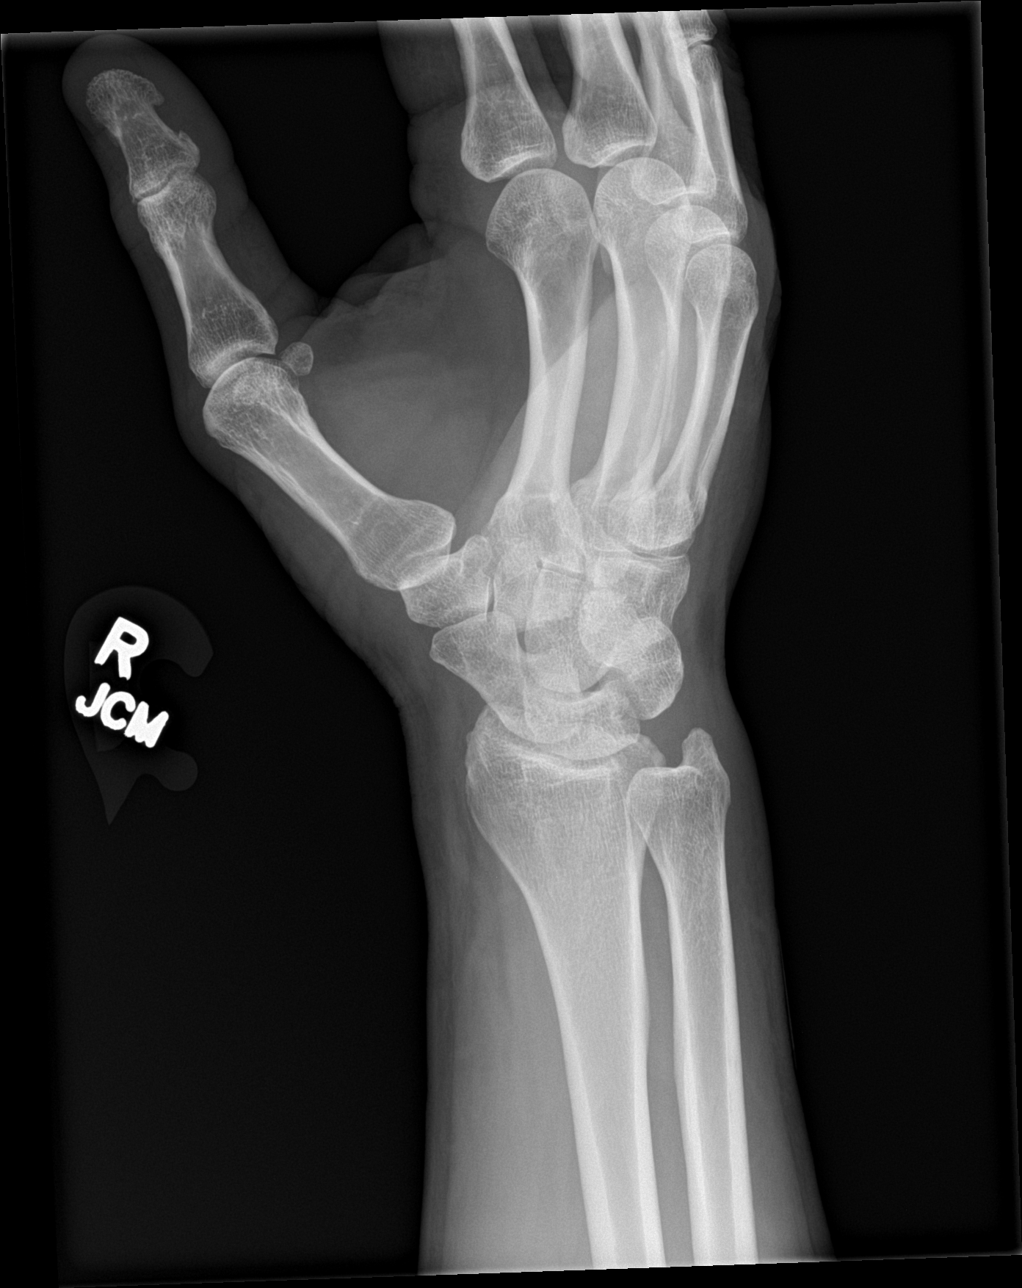

[wrist lat]
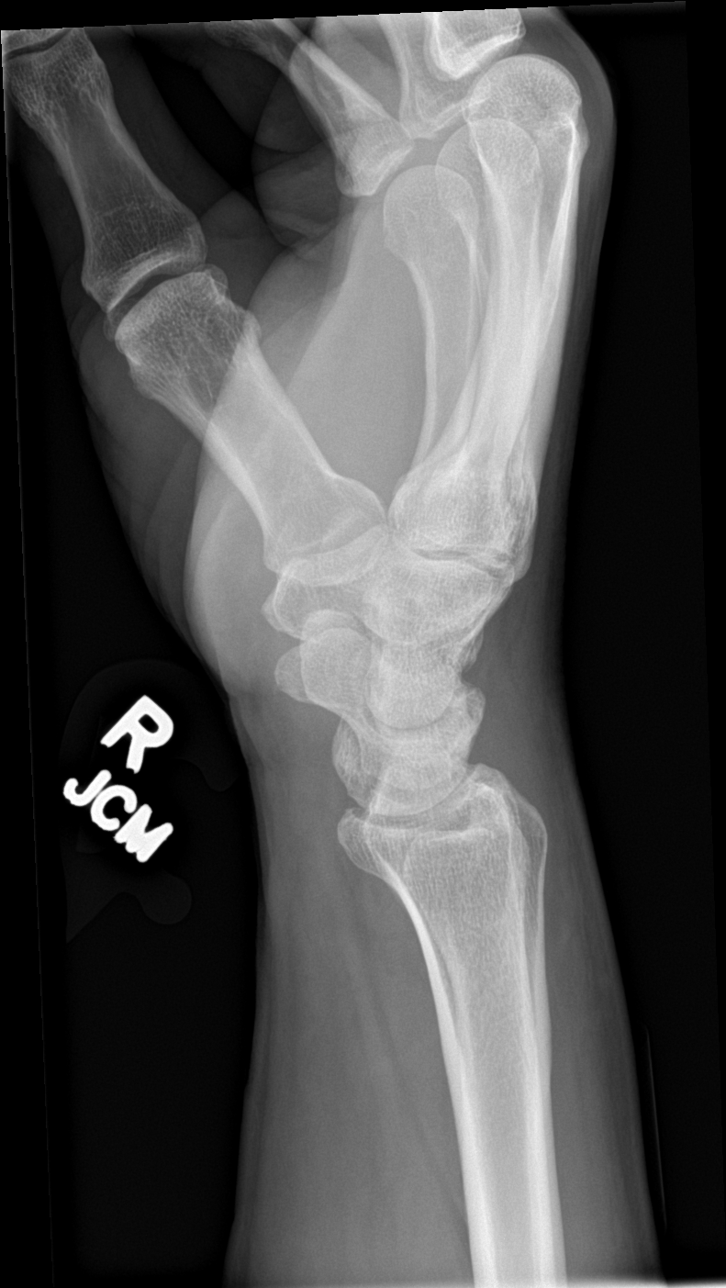

[wrist navicular]
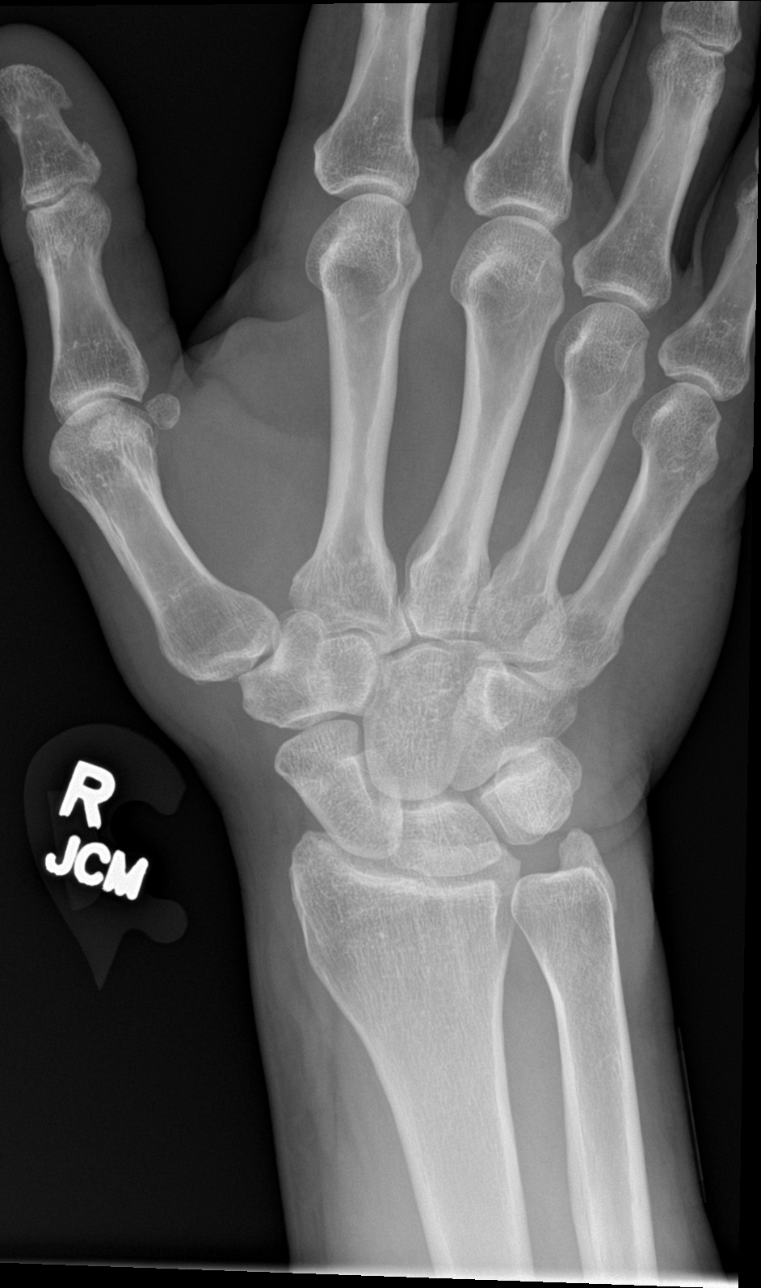

[4 of 4 positions shown; findings below may reference images not displayed]

FINDINGS: There is no evidence of fracture or dislocation. There is no
evidence of arthropathy or other focal bone abnormality. Dorsal soft
tissue swelling.
IMPRESSION: Negative.

## 2020-11-25 IMAGING — CT CT HEAD W/O CM
3 of 4 series · 14 of 47 positions shown, 16 images · non-contrast
Comparison: None.

CLINICAL DATA: house for epistaxis, normal mental status. states
fall with LOC this am after leaving bathroom

EXAM:
CT HEAD WITHOUT CONTRAST
TECHNIQUE: Contiguous axial images were obtained from the base of the skull
through the vertex without intravenous contrast.

[Series 4: head 2.0 h70h · axial · 0.44mm/px · z∈[+91,+235]mm · 8 of 90 slices shown, 10 images]
[im 9/90  brain]
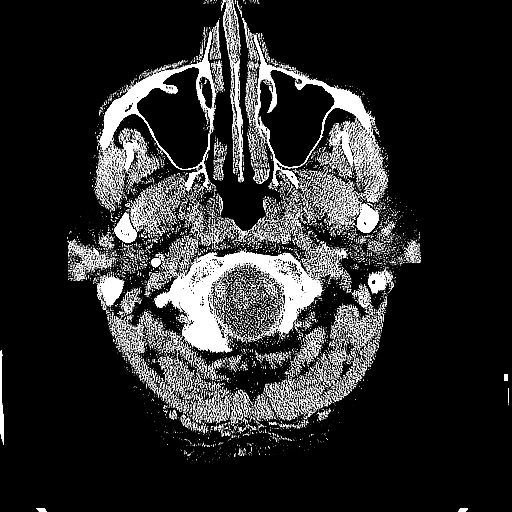
[im 9/90  bone]
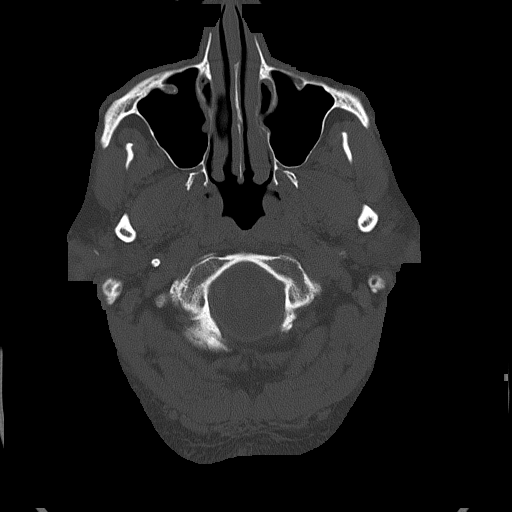
[im 17/90  brain]
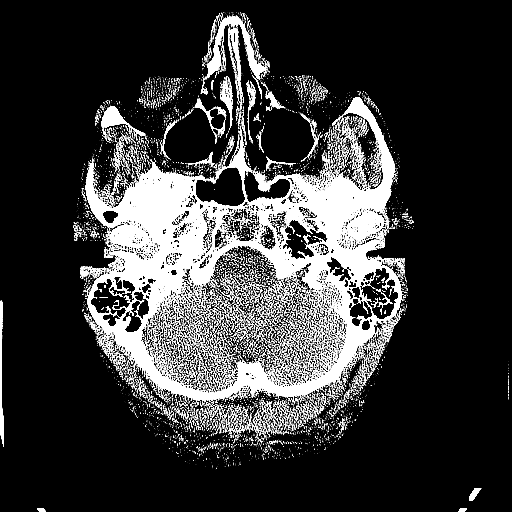
[im 30/90  brain]
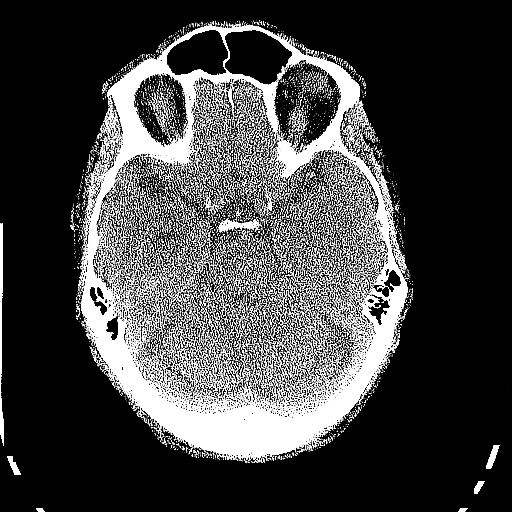
[im 39/90  brain]
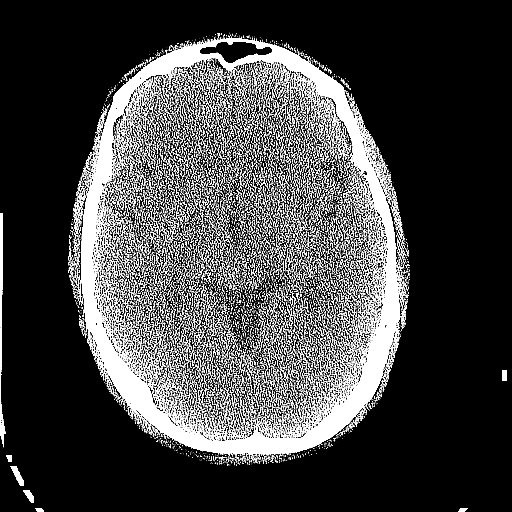
[im 51/90  brain]
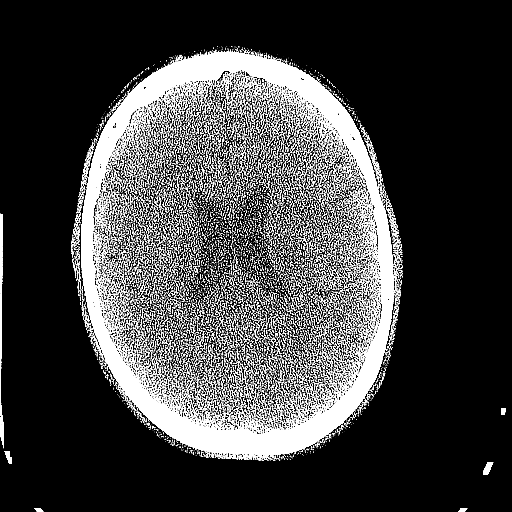
[im 51/90  bone]
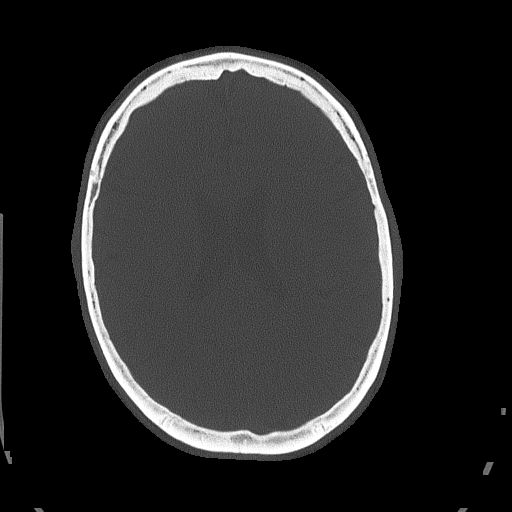
[im 60/90  brain]
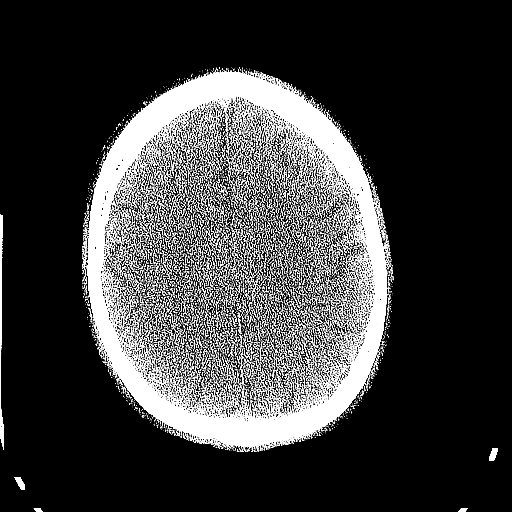
[im 73/90  brain]
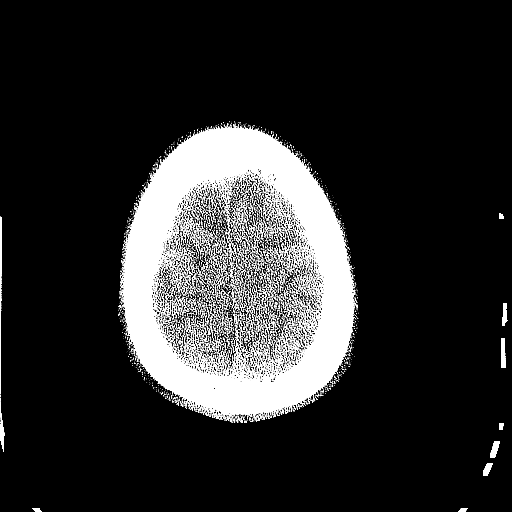
[im 81/90  brain]
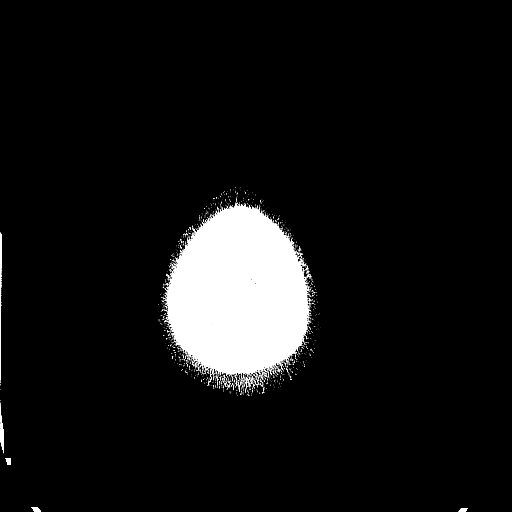

[Series 5: head 3.0 mpr cor · coronal · 0.35mm/px · 3 of 84 slices shown]
[im 28/84  brain]
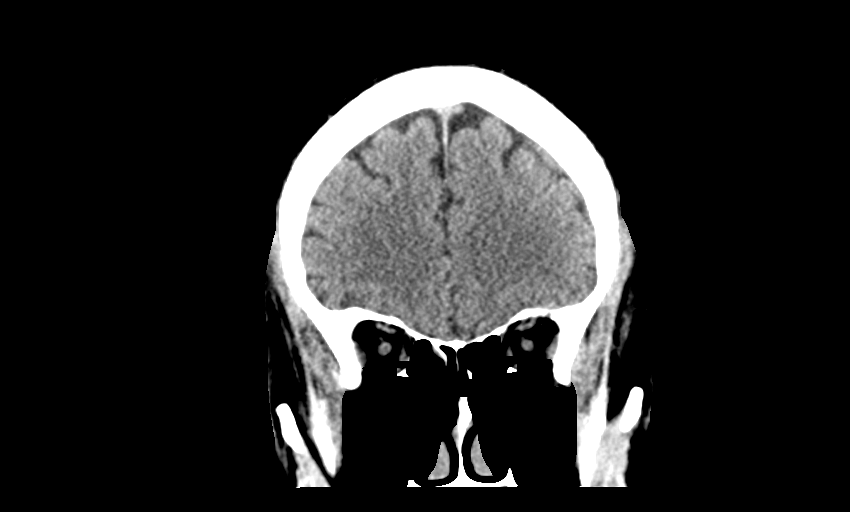
[im 37/84  brain]
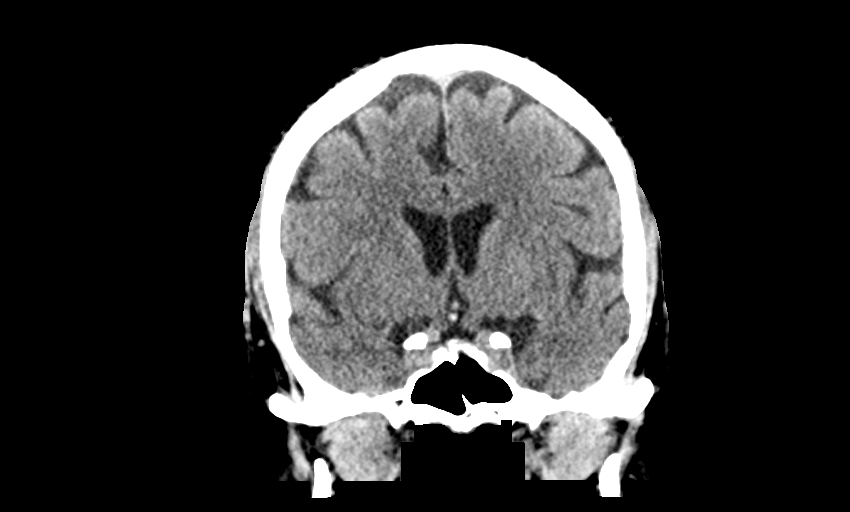
[im 47/84  brain]
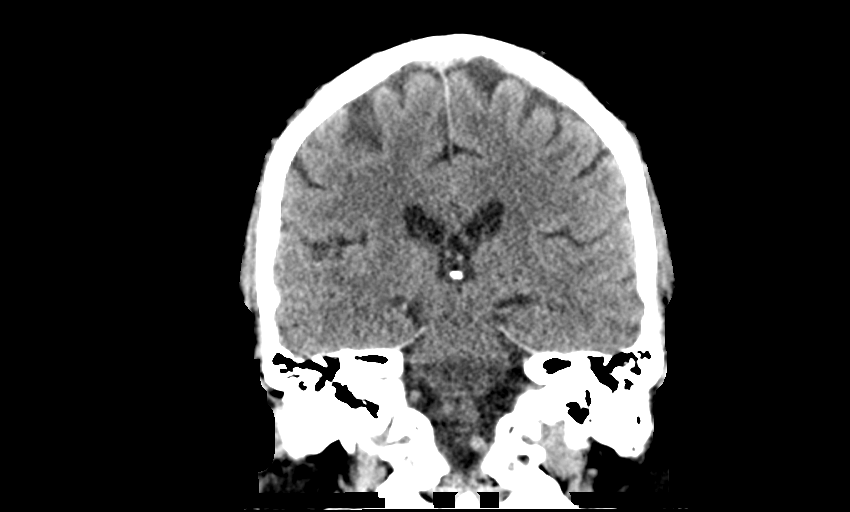

[Series 6: head 3.0 mpr sag · sagittal · 0.35mm/px · 3 of 67 slices shown]
[im 23/67  brain]
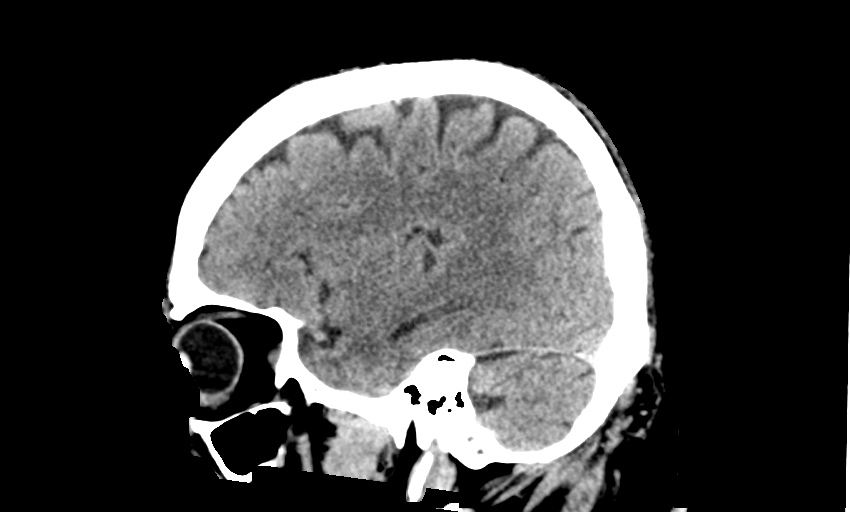
[im 34/67  brain]
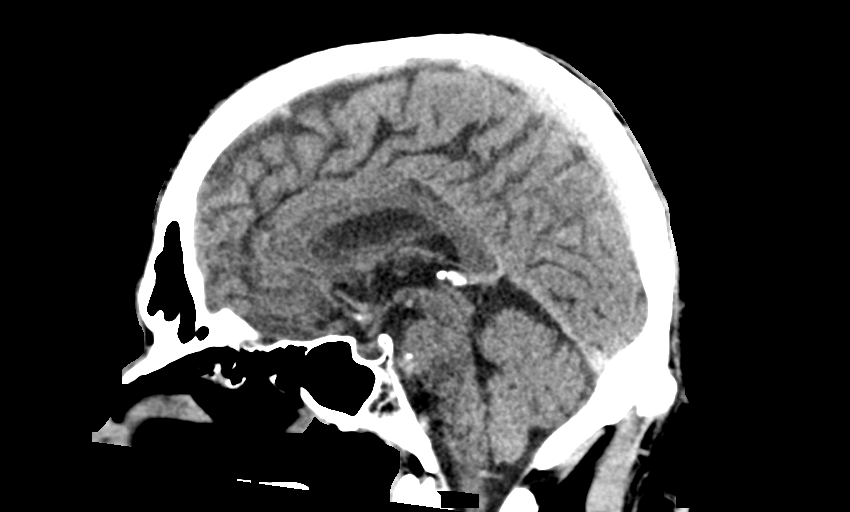
[im 45/67  brain]
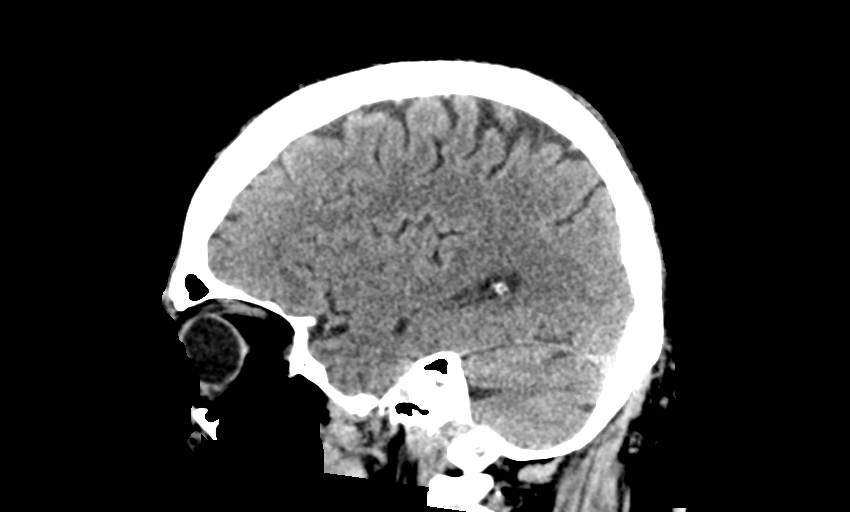

[14 of 47 positions shown; findings below may reference images not displayed]

FINDINGS: Brain:

No evidence of large-territorial acute infarction. No parenchymal
hemorrhage. No mass lesion. No extra-axial collection.

No mass effect or midline shift. No hydrocephalus. Basilar cisterns
are patent.

Vascular: No hyperdense vessel.

Skull: No acute fracture or focal lesion.

Sinuses/Orbits: Paranasal sinuses and mastoid air cells are clear.
The orbits are unremarkable.

Other: Scalp subcutaneus soft tissue densities as well as dermal
thickening scattered throughout. No scalp hematoma formation.
IMPRESSION: 1. No acute intracranial abnormality.
2. Scalp subcutaneus soft tissue densities as well as dermal
thickening scattered throughout. Correlate with physical exam.

## 2020-11-25 MED ORDER — SODIUM CHLORIDE 0.9 % IV BOLUS
1000.0000 mL | Freq: Once | INTRAVENOUS | Status: AC
  Administered 2020-11-26: 1000 mL via INTRAVENOUS

## 2020-11-25 NOTE — ED Notes (Signed)
Pt complains of feeling dizzy and like he's going to pass out. NT retook pts VS and triage RN notified.

## 2020-11-25 NOTE — ED Triage Notes (Signed)
[facility] was called to pt's house for epistaxis.  When [facility] arrived, bleeding was controlled.  Pt states fall with LOC this am after leaving bathroom and now c/o L wrist pain.  Pt also c/o temporal headache.  Recently tx for groin wound.  PT states he has been taking his antibiotics.  No neuro deficits noted in triage.   bp 246/110 bhr 110 sats 97% RA rr 16 cbg 486 Temp 97.3

## 2020-11-25 NOTE — ED Notes (Signed)
Patient fell in waiting room, was found on the floor in front of bathroom.  Patient denies hitting his head.  Patient continues with dizziness.  He stated he saw "white spots" and went down.  VSS done.

## 2020-11-25 NOTE — ED Provider Notes (Signed)
[facility] EMERGENCY DEPARTMENT Provider Note   CSN: [id number] Arrival date & time: 11/25/20  1837     History Chief Complaint  Patient presents with  . Hyperglycemia    Brett White is a 60 y.o. male.  Patient presents to the emergency department with chief complaint of syncope.  Per nursing note, EMS was called out to the patient's house for epistaxis with associated syncope.  Patient is uncertain whether or not he hit his head.  He does complain of right wrist pain as well as headache.  He does not recall what happened.  He does state that he felt lightheaded prior to passing out.  He states that this is happened before when his blood pressure has been running high.  He does not take anything for his blood pressure.  He denies any chest pain or shortness of breath.  Denies any numbness, weakness, or tingling.  Denies any slurred speech or vision changes.  He states that his blood sugar has been high as well.  He takes Metformin for this.  The history is provided by the patient. No language interpreter was used.       Past Medical History:  Diagnosis Date  . Diabetes mellitus without complication (HCC)     There are no problems to display for this patient.   Past Surgical History:  Procedure Laterality Date  . APPENDECTOMY    . BACK SURGERY    . TONSILLECTOMY         No family history on file.  Social History   Tobacco Use  . Smoking status: Never Smoker  . Smokeless tobacco: Never Used  Substance Use Topics  . Alcohol use: Not Currently  . Drug use: Not Currently    Home Medications Prior to Admission medications   Medication Sig Start Date End Date Taking? Authorizing Provider  diphenhydramine-acetaminophen (TYLENOL PM) 25-500 MG TABS tablet Take 2 tablets by mouth at bedtime as needed.    [provider]  doxycycline (VIBRAMYCIN) 100 MG capsule Take 1 capsule (100 mg total) by mouth 2 (two) times daily. One po bid x 7 days  11/05/20   Dartha Lodge, PA-C  HYDROcodone-acetaminophen [facility]) 5-325 MG tablet Take 1-2 tablets by mouth every 6 (six) hours as needed. 11/05/20   Dartha Lodge, PA-C  ibuprofen (ADVIL) 600 MG tablet Take 1 tablet (600 mg total) by mouth every 6 (six) hours as needed. 11/05/20   Dartha Lodge, PA-C  metFORMIN (GLUCOPHAGE) 500 MG tablet Take 1,000 mg by mouth 2 (two) times daily with a meal.    [provider]  oxyCODONE-acetaminophen (PERCOCET/ROXICET) 5-325 MG tablet Take 1-2 tablets by mouth every 6 (six) hours as needed for severe pain. 11/08/20   Robinson, [place] N, PA-C    Allergies    Patient has no known allergies.  Review of Systems   Review of Systems  All other systems reviewed and are negative.   Physical Exam Updated Vital Signs BP (!) 177/118 (BP Location: Right Arm)   Pulse 87   Temp 98.2 F (36.8 C) (Oral)   Resp 19   Ht 6\' 3"  (1.905 m)   Wt 97.5 kg   SpO2 100%   BMI 26.87 kg/m   Physical Exam Vitals and nursing note reviewed.  Constitutional:      Appearance: He is well-developed and well-nourished.  HENT:     Head: Normocephalic and atraumatic.     Comments: No evidence of head trauma Eyes:  Conjunctiva/sclera: Conjunctivae normal.  Cardiovascular:     Rate and Rhythm: Normal rate and regular rhythm.     Heart sounds: No murmur heard.   Pulmonary:     Effort: Pulmonary effort is normal. No respiratory distress.     Breath sounds: Normal breath sounds.  Abdominal:     Palpations: Abdomen is soft.     Tenderness: There is no abdominal tenderness.  Musculoskeletal:        General: No edema. Normal range of motion.     Cervical back: Neck supple.     Comments: Moves all extremities, normal strength in upper and lower extremities  Skin:    General: Skin is warm and dry.  Neurological:     Mental Status: He is alert and oriented to person, place, and time.     Comments: CN III-XII intact Speech is clear Movements are goal oriented No  pronator drift Normal finger-nose  Psychiatric:        Mood and Affect: Mood and affect and mood normal.        Behavior: Behavior normal.     ED Results / Procedures / Treatments   Labs (all labs ordered are listed, but only abnormal results are displayed) Labs Reviewed  URINALYSIS, ROUTINE W REFLEX MICROSCOPIC - Abnormal; Notable for the following components:      Result Value   Color, Urine STRAW (*)    Glucose, UA >=500 (*)    All other components within normal limits  COMPREHENSIVE METABOLIC PANEL - Abnormal; Notable for the following components:   Sodium 132 (*)    Chloride 97 (*)    Glucose, Bld 573 (*)    Creatinine, Ser 1.30 (*)    AST 14 (*)    All other components within normal limits  CBG MONITORING, ED - Abnormal; Notable for the following components:   Glucose-Capillary 546 (*)    All other components within normal limits  CBG MONITORING, ED - Abnormal; Notable for the following components:   Glucose-Capillary 349 (*)    All other components within normal limits  CBG MONITORING, ED - Abnormal; Notable for the following components:   Glucose-Capillary 397 (*)    All other components within normal limits  CBC  CBG MONITORING, ED    EKG EKG Interpretation  Date/Time:  Friday November 25 2020 18:40:47 EST Ventricular Rate:  103 PR Interval:  152 QRS Duration: 88 QT Interval:  342 QTC Calculation: 448 R Axis:   -12 Text Interpretation: Sinus tachycardia Otherwise normal ECG Sinus tachycardia, no STEMI, no previous Confirmed by Coralee Pesa [phone]) on 11/25/2020 8:43:01 PM   Radiology DG Wrist Complete Right  Result Date: 11/25/2020 CLINICAL DATA:  Fall wrist pain EXAM: RIGHT WRIST - COMPLETE 3+ VIEW COMPARISON:  None. FINDINGS: There is no evidence of fracture or dislocation. There is no evidence of arthropathy or other focal bone abnormality. Dorsal soft tissue swelling. IMPRESSION: Negative. Electronically Signed   By: Jonna Clark M.D.   On:  11/25/2020 19:58   CT Head Wo Contrast  Result Date: 11/26/2020 CLINICAL DATA:  house for epistaxis, normal mental status. states fall with LOC this am after leaving bathroom EXAM: CT HEAD WITHOUT CONTRAST TECHNIQUE: Contiguous axial images were obtained from the base of the skull through the vertex without intravenous contrast. COMPARISON:  None. FINDINGS: Brain: No evidence of large-territorial acute infarction. No parenchymal hemorrhage. No mass lesion. No extra-axial collection. No mass effect or midline shift. No hydrocephalus. Basilar cisterns are patent. Vascular: No  hyperdense vessel. Skull: No acute fracture or focal lesion. Sinuses/Orbits: Paranasal sinuses and mastoid air cells are clear. The orbits are unremarkable. Other: Scalp subcutaneus soft tissue densities as well as dermal thickening scattered throughout. No scalp hematoma formation. IMPRESSION: 1. No acute intracranial abnormality. 2. Scalp subcutaneus soft tissue densities as well as dermal thickening scattered throughout. Correlate with physical exam. Electronically Signed   By: Tish Frederickson M.D.   On: 11/26/2020 00:01   MR BRAIN WO CONTRAST  Result Date: 11/26/2020 CLINICAL DATA:  60 year old male with dizziness, epistaxis, fall with loss of consciousness. EXAM: MRI HEAD WITHOUT CONTRAST TECHNIQUE: Multiplanar, multiecho pulse sequences of the brain and surrounding structures were obtained without intravenous contrast. COMPARISON:  Head CT 11/25/2020. FINDINGS: Brain: No restricted diffusion or evidence of acute infarction. Chronic appearing symmetric cystic encephalomalacia in the bilateral globus pallidus (series 6, image 14). Other deep gray matter nuclei remain within normal limits. No midline shift, mass effect, evidence of mass lesion, ventriculomegaly, extra-axial collection or acute intracranial hemorrhage. Cervicomedullary junction and pituitary are within normal limits. Small area of encephalomalacia suspected in the left  lateral cerebellum on series 6, image 6. But aside from that in the above gray and white matter signal is largely normal for age throughout the brain. No chronic cerebral blood products identified. Vascular: Major intracranial vascular flow voids are preserved with generalized intracranial artery tortuosity. Skull and upper cervical spine: Negative. Sinuses/Orbits: Negative orbits. Trace paranasal sinus mucosal thickening. Other: Mastoids are clear. Grossly normal visible internal auditory structures. Normal stylomastoid foramina. Small right postauricular scalp sebaceous cyst (series 6, image 7). IMPRESSION: 1. No acute intracranial abnormality. 2. 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 Possible small area of nonspecific encephalomalacia in the left lateral cerebellum. Electronically Signed   By: Odessa Fleming M.D.   On: 11/26/2020 05:13    Procedures Procedures   Medications Ordered in ED Medications  sodium chloride 0.9 % bolus 1,000 mL (has no administration in time range)    ED Course  I have reviewed the triage vital signs and the nursing notes.  Pertinent labs & imaging results that were available during my care of the patient were reviewed by me and considered in my medical decision making (see chart for details).    MDM Rules/Calculators/A&P                          This patient complains of headache and syncope, this involves an extensive number of treatment options, and is a complaint that carries with it a high risk of complications and morbidity.    Differential Dx Trauma, syncope, arrhythmia, hypoglycemia, hypertensive crisis  Pertinent Labs I ordered, reviewed, and interpreted labs, which included CBC which was normal, CMP notable for mild dehydration, sodium 132, creatinine 1.3, chloride 97, and glucose 573.  Glucose is trended down in the ED.  Imaging Interpretation I ordered imaging studies which included CT  head and MRI brain, both of which were negative for acute findings.  There was some chronic encephalomalacia seen on MRI.  Medications I ordered medication pain medicine and labetalol for headache and blood pressure.  Reassessments After the interventions stated above, I reevaluated the patient and found at 5:57 AM patient is feeling improved.  I discussed all of his results and patient feels comfortable with discharge plan.  Consultants None  Plan Discharge with outpatient follow-up.  This was a shared visit with Dr. Blinda Leatherwood, who agrees  with workup and discharge plan.    Final Clinical Impression(s) / ED Diagnoses Final diagnoses:  Syncope, unspecified syncope type  Nonintractable headache, unspecified chronicity pattern, unspecified headache type  Elevated blood pressure reading    Rx / DC Orders ED Discharge Orders         Ordered    hydrochlorothiazide (HYDRODIURIL) 12.5 MG tablet  Daily        11/26/20 0553           Roxy HorsemanBrowning, Robert, PA-C 11/26/20 0557    Gilda CreasePollina, Christopher J, MD 11/26/20 [phone]

## 2020-11-25 NOTE — ED Notes (Signed)
Patient transported to CT 

## 2020-11-25 NOTE — ED Notes (Signed)
No answer for room x3 

## 2020-11-26 ENCOUNTER — Emergency Department (HOSPITAL_COMMUNITY): Payer: Medicare HMO

## 2020-11-26 LAB — CBG MONITORING, ED: Glucose-Capillary: 397 mg/dL — ABNORMAL HIGH (ref 70–99)

## 2020-11-26 IMAGING — MR MR HEAD W/O CM
7 of 11 series · 25 of 48 positions shown · non-contrast
Comparison: Head CT [DATE].

CLINICAL DATA: 59-year-old male with dizziness, epistaxis, fall
with loss of consciousness.

EXAM:
MRI HEAD WITHOUT CONTRAST
TECHNIQUE: Multiplanar, multiecho pulse sequences of the brain and surrounding
structures were obtained without intravenous contrast.

[Series 3: DWI · axial · 3.0mm · 0.94mm/px · z∈[-86,+72]mm · 8 of 112 slices shown (1 of 2)]
[im 1/112]
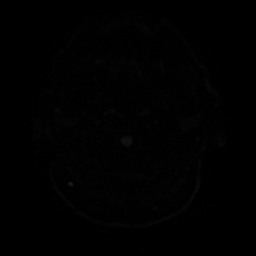
[im 16/112]
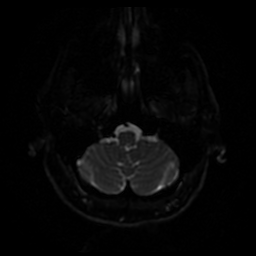
[im 32/112]
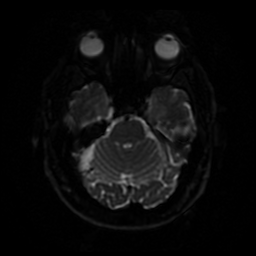
[im 48/112]
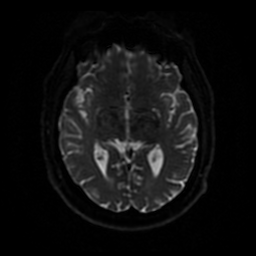
[im 64/112]
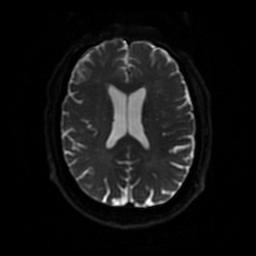
[im 80/112]
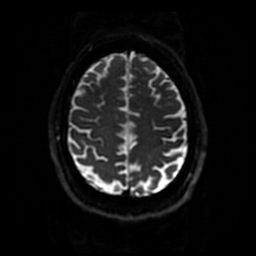
[im 96/112]
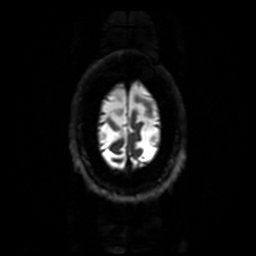
[im 112/112]
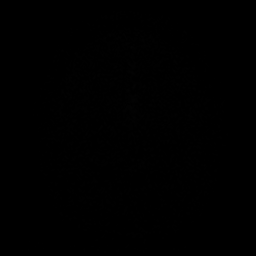

[Series 4: DWI · coronal · 4.0mm · 0.94mm/px · 5 of 76 slices shown (2 of 2)]
[im 1/76]
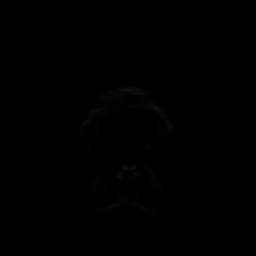
[im 19/76]
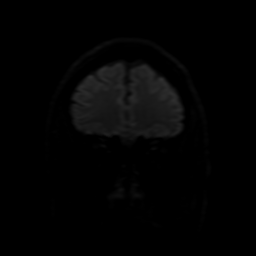
[im 38/76]
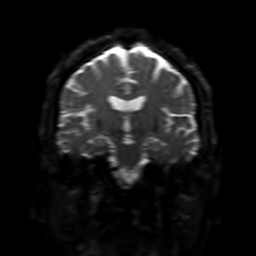
[im 57/76]
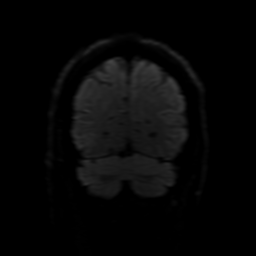
[im 76/76]
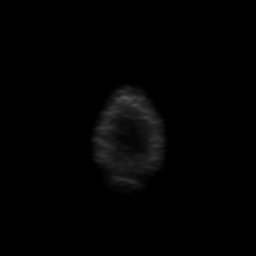

[Series 5: FLAIR · sagittal · 5.0mm · 0.23mm/px · 2 of 27 slices shown (1 of 2)]
[im 1/27]
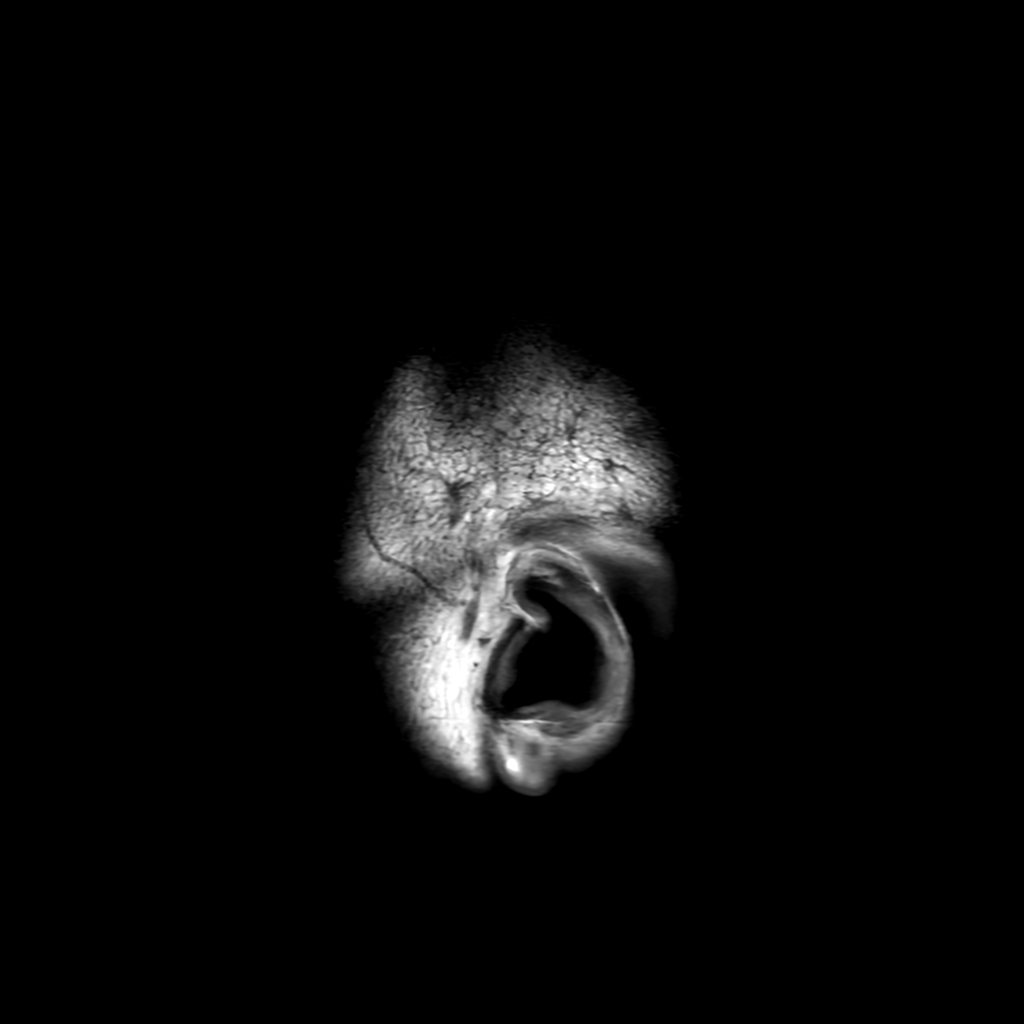
[im 27/27]
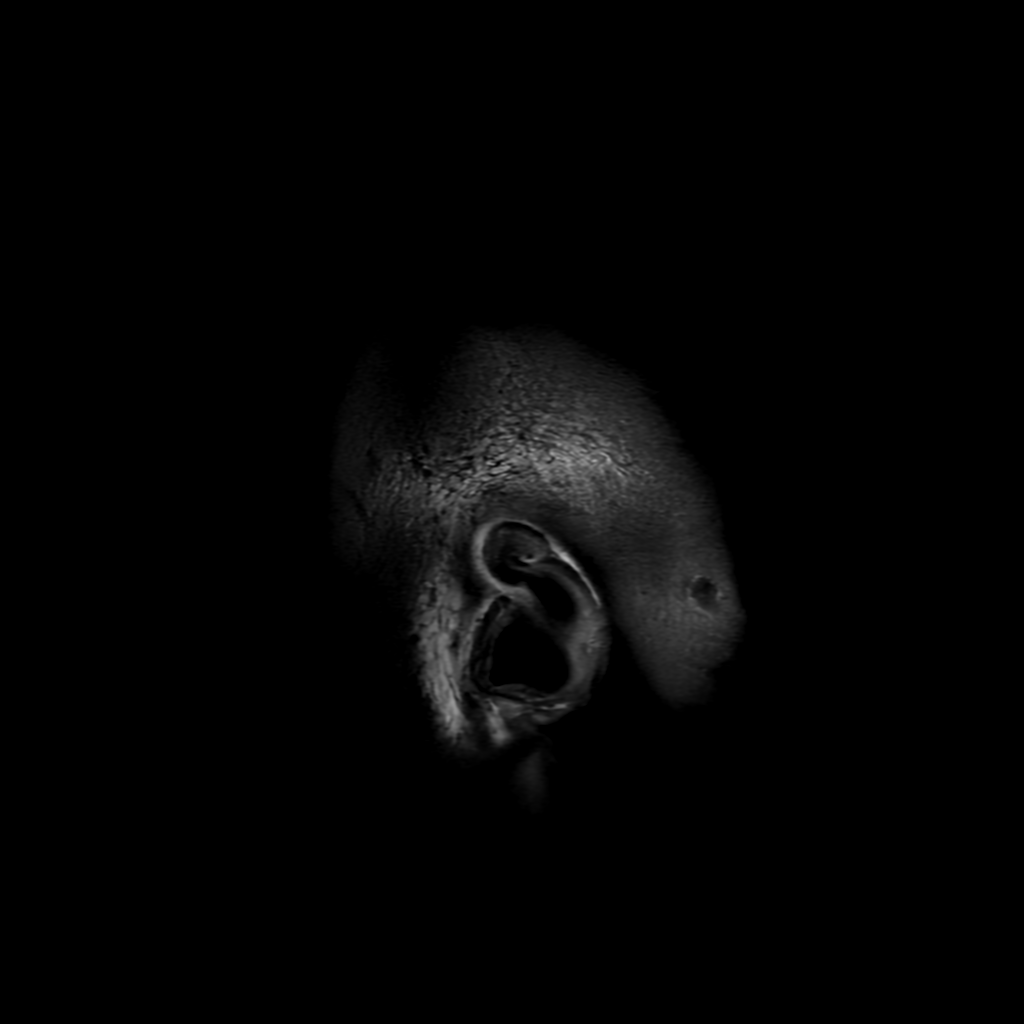

[Series 6: T2 · axial · 5.0mm · 0.23mm/px · 1 of 28 slices shown]
[im 1/28]
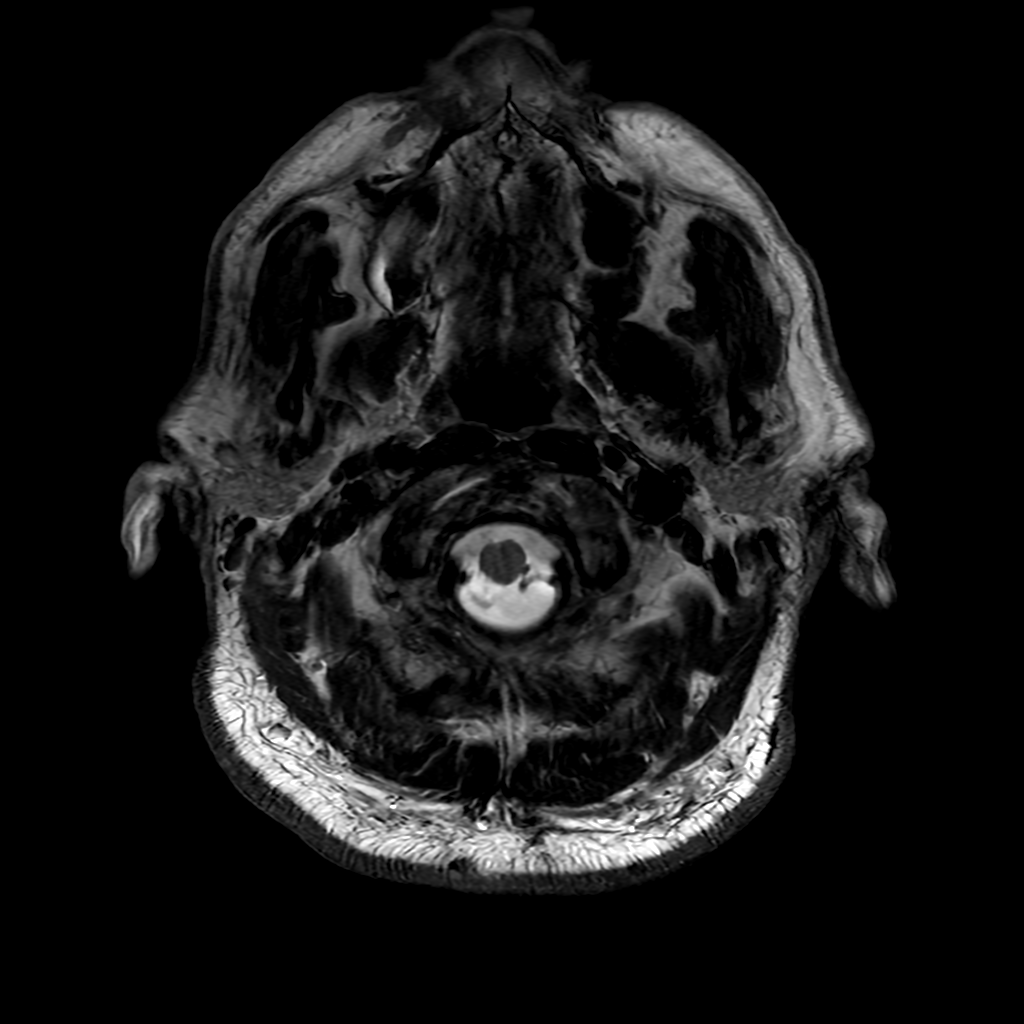

[Series 7: FLAIR · axial · 3.0mm · 0.45mm/px · z∈[-81,+76]mm · 2 of 28 slices shown (2 of 2)]
[im 1/28]
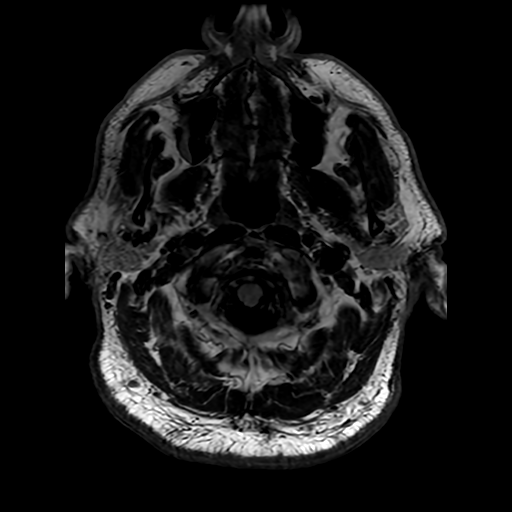
[im 28/28]
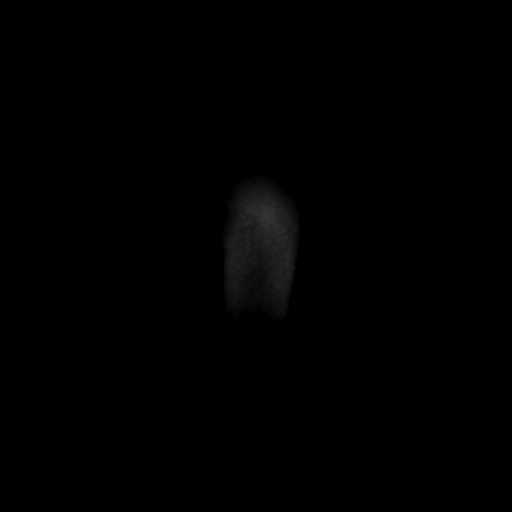

[Series 350: ADC · axial · 3.0mm · 0.94mm/px · z∈[-86,+72]mm · 4 of 56 slices shown (1 of 2)]
[im 1/56]
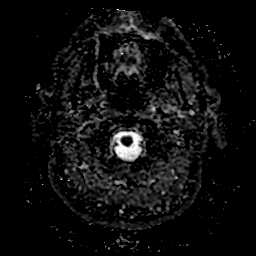
[im 19/56]
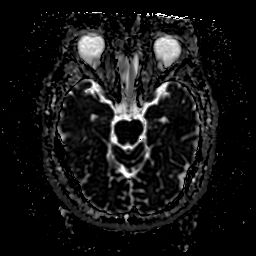
[im 37/56]
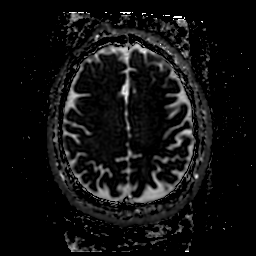
[im 56/56]
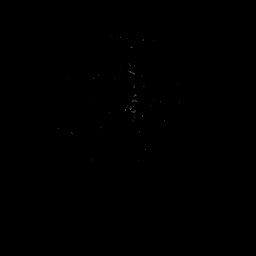

[Series 450: ADC · coronal · 4.0mm · 0.94mm/px · 3 of 37 slices shown (2 of 2)]
[im 1/37]
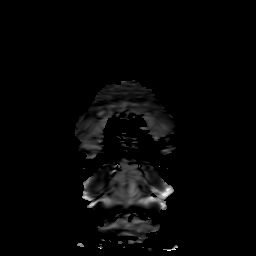
[im 19/37]
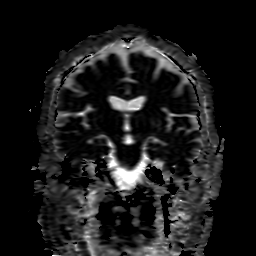
[im 37/37]
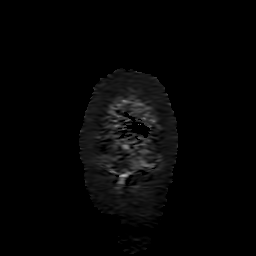

[25 of 48 positions shown; findings below may reference images not displayed]

FINDINGS: Brain: No restricted diffusion or evidence of acute infarction.

Chronic appearing symmetric cystic encephalomalacia in the bilateral
globus pallidus (series 6, image 14). Other deep gray matter nuclei
remain within normal limits.

No midline shift, mass effect, evidence of mass lesion,
ventriculomegaly, extra-axial collection or acute intracranial
hemorrhage. Cervicomedullary junction and pituitary are within
normal limits.

Small area of encephalomalacia suspected in the left lateral
cerebellum on series 6, image 6. But aside from that in the above
gray and white matter signal is largely normal for age throughout
the brain. No chronic cerebral blood products identified.

Vascular: Major intracranial vascular flow voids are preserved with
generalized intracranial artery tortuosity.

Skull and upper cervical spine: Negative.

Sinuses/Orbits: Negative orbits. Trace paranasal sinus mucosal
thickening.

Other: Mastoids are clear. Grossly normal visible internal auditory
structures. Normal stylomastoid foramina. Small right postauricular
scalp sebaceous cyst (series 6, image 7).
IMPRESSION: 1. No acute intracranial abnormality.
2. Symmetric cystic encephalomalacia in both globus pallidus, such
as from prior toxic ingestion/exposure. Possible small area of
nonspecific encephalomalacia in the left lateral cerebellum.

## 2020-11-26 MED ORDER — LABETALOL HCL 5 MG/ML IV SOLN
20.0000 mg | Freq: Once | INTRAVENOUS | Status: AC
  Administered 2020-11-26: 20 mg via INTRAVENOUS
  Filled 2020-11-26: qty 4

## 2020-11-26 MED ORDER — HYDROCODONE-ACETAMINOPHEN 5-325 MG PO TABS
2.0000 | ORAL_TABLET | Freq: Once | ORAL | Status: AC
  Administered 2020-11-26: 2 via ORAL
  Filled 2020-11-26: qty 2

## 2020-11-26 MED ORDER — HYDROCHLOROTHIAZIDE 12.5 MG PO TABS: 12.5000 mg | ORAL_TABLET | Freq: Every day | ORAL | 1 refills | Status: DC

## 2020-11-26 NOTE — Discharge Instructions (Addendum)
You need to follow-up with a primary care doctor.  Please contact the [facility] or see your own doctor.    You blood work and imaging studies did not reveal a cause of your symptoms tonight.  You will require additional outpatient workup.  Please return to the ER for new or worsening symptoms.

## 2020-11-26 NOTE — ED Notes (Signed)
Patient transported to MRI 

## 2020-11-26 NOTE — Progress Notes (Signed)
Orthopedic Tech Progress Note Patient Details:  Brett White 12-08-1960 [id number]  Ortho Devices Type of Ortho Device: Velcro wrist splint Ortho Device/Splint Location: Right Wrist Ortho Device/Splint Interventions: Application   Post Interventions Patient Tolerated: Well Instructions Provided: Adjustment of device   [name] 11/26/2020, 1:23 AM

## 2021-02-02 ENCOUNTER — Other Ambulatory Visit: Payer: Self-pay

## 2021-02-02 ENCOUNTER — Encounter (HOSPITAL_COMMUNITY): Payer: Self-pay

## 2021-02-02 ENCOUNTER — Inpatient Hospital Stay (HOSPITAL_COMMUNITY)
Admission: EM | Admit: 2021-02-02 | Discharge: 2021-02-19 | DRG: 617 | Disposition: A | Discharge status: Skilled Nursing Facility | Payer: Medicare HMO | Attending: Internal Medicine | Admitting: Internal Medicine

## 2021-02-02 ENCOUNTER — Emergency Department (HOSPITAL_COMMUNITY): Payer: Medicare HMO

## 2021-02-02 DIAGNOSIS — M86172 Other acute osteomyelitis, left ankle and foot: Secondary | ICD-10-CM | POA: Diagnosis present

## 2021-02-02 DIAGNOSIS — M869 Osteomyelitis, unspecified: Secondary | ICD-10-CM | POA: Diagnosis present

## 2021-02-02 DIAGNOSIS — M8600 Acute hematogenous osteomyelitis, unspecified site: Secondary | ICD-10-CM

## 2021-02-02 DIAGNOSIS — I152 Hypertension secondary to endocrine disorders: Secondary | ICD-10-CM | POA: Diagnosis present

## 2021-02-02 DIAGNOSIS — Z79899 Other long term (current) drug therapy: Secondary | ICD-10-CM

## 2021-02-02 DIAGNOSIS — E872 Acidosis, unspecified: Secondary | ICD-10-CM | POA: Diagnosis present

## 2021-02-02 DIAGNOSIS — Z8673 Personal history of transient ischemic attack (TIA), and cerebral infarction without residual deficits: Secondary | ICD-10-CM

## 2021-02-02 DIAGNOSIS — E1169 Type 2 diabetes mellitus with other specified complication: Secondary | ICD-10-CM | POA: Diagnosis not present

## 2021-02-02 DIAGNOSIS — E1165 Type 2 diabetes mellitus with hyperglycemia: Secondary | ICD-10-CM | POA: Diagnosis present

## 2021-02-02 DIAGNOSIS — I10 Essential (primary) hypertension: Secondary | ICD-10-CM | POA: Diagnosis present

## 2021-02-02 DIAGNOSIS — E11621 Type 2 diabetes mellitus with foot ulcer: Secondary | ICD-10-CM | POA: Diagnosis present

## 2021-02-02 DIAGNOSIS — L03032 Cellulitis of left toe: Secondary | ICD-10-CM

## 2021-02-02 DIAGNOSIS — Z9119 Patient's noncompliance with other medical treatment and regimen: Secondary | ICD-10-CM

## 2021-02-02 DIAGNOSIS — L97529 Non-pressure chronic ulcer of other part of left foot with unspecified severity: Secondary | ICD-10-CM | POA: Diagnosis present

## 2021-02-02 DIAGNOSIS — Z20822 Contact with and (suspected) exposure to covid-19: Secondary | ICD-10-CM | POA: Diagnosis present

## 2021-02-02 DIAGNOSIS — Z7984 Long term (current) use of oral hypoglycemic drugs: Secondary | ICD-10-CM

## 2021-02-02 DIAGNOSIS — E119 Type 2 diabetes mellitus without complications: Secondary | ICD-10-CM | POA: Diagnosis present

## 2021-02-02 DIAGNOSIS — L03116 Cellulitis of left lower limb: Secondary | ICD-10-CM | POA: Diagnosis present

## 2021-02-02 DIAGNOSIS — Z9114 Patient's other noncompliance with medication regimen: Secondary | ICD-10-CM

## 2021-02-02 DIAGNOSIS — E1142 Type 2 diabetes mellitus with diabetic polyneuropathy: Secondary | ICD-10-CM | POA: Diagnosis present

## 2021-02-02 DIAGNOSIS — E871 Hypo-osmolality and hyponatremia: Secondary | ICD-10-CM | POA: Diagnosis present

## 2021-02-02 DIAGNOSIS — Z794 Long term (current) use of insulin: Secondary | ICD-10-CM | POA: Diagnosis present

## 2021-02-02 DIAGNOSIS — I951 Orthostatic hypotension: Secondary | ICD-10-CM | POA: Diagnosis not present

## 2021-02-02 DIAGNOSIS — E869 Volume depletion, unspecified: Secondary | ICD-10-CM | POA: Diagnosis present

## 2021-02-02 DIAGNOSIS — E875 Hyperkalemia: Secondary | ICD-10-CM | POA: Diagnosis not present

## 2021-02-02 LAB — COMPREHENSIVE METABOLIC PANEL
ALT: 13 U/L (ref 0–44)
AST: 16 U/L (ref 15–41)
Albumin: 3.7 g/dL (ref 3.5–5.0)
Alkaline Phosphatase: 82 U/L (ref 38–126)
Anion gap: 10 (ref 5–15)
BUN: 20 mg/dL (ref 6–20)
CO2: 25 mmol/L (ref 22–32)
Calcium: 8.9 mg/dL (ref 8.9–10.3)
Chloride: 100 mmol/L (ref 98–111)
Creatinine, Ser: 1.38 mg/dL — ABNORMAL HIGH (ref 0.61–1.24)
GFR, Estimated: 60 mL/min — ABNORMAL LOW (ref >60)
Glucose, Bld: 311 mg/dL — ABNORMAL HIGH (ref 70–99)
Potassium: 3.7 mmol/L (ref 3.5–5.1)
Sodium: 135 mmol/L (ref 135–145)
Total Bilirubin: 0.6 mg/dL (ref 0.3–1.2)
Total Protein: 7.7 g/dL (ref 6.5–8.1)

## 2021-02-02 LAB — PROTIME-INR
INR: 1 (ref 0.8–1.2)
Prothrombin Time: 13.6 seconds (ref 11.4–15.2)

## 2021-02-02 LAB — CBC WITH DIFFERENTIAL/PLATELET
Abs Immature Granulocytes: 0.03 10*3/uL (ref 0.00–0.07)
Basophils Absolute: 0 10*3/uL (ref 0.0–0.1)
Basophils Relative: 1 %
Eosinophils Absolute: 0.1 10*3/uL (ref 0.0–0.5)
Eosinophils Relative: 1 %
HCT: 41.3 % (ref 39.0–52.0)
Hemoglobin: 13.4 g/dL (ref 13.0–17.0)
Immature Granulocytes: 0 %
Lymphocytes Relative: 17 %
Lymphs Abs: 1.4 10*3/uL (ref 0.7–4.0)
MCH: 28.8 pg (ref 26.0–34.0)
MCHC: 32.4 g/dL (ref 30.0–36.0)
MCV: 88.8 fL (ref 80.0–100.0)
Monocytes Absolute: 0.5 10*3/uL (ref 0.1–1.0)
Monocytes Relative: 6 %
Neutro Abs: 6.2 10*3/uL (ref 1.7–7.7)
Neutrophils Relative %: 75 %
Platelets: 335 10*3/uL (ref 150–400)
RBC: 4.65 MIL/uL (ref 4.22–5.81)
RDW: 12.2 % (ref 11.5–15.5)
WBC: 8.2 10*3/uL (ref 4.0–10.5)
nRBC: 0 % (ref 0.0–0.2)

## 2021-02-02 LAB — URINALYSIS, ROUTINE W REFLEX MICROSCOPIC
Bacteria, UA: NONE SEEN
Bilirubin Urine: NEGATIVE
Glucose, UA: >=500 mg/dL — AB
Ketones, ur: NEGATIVE mg/dL
Leukocytes,Ua: NEGATIVE
Nitrite: NEGATIVE
Protein, ur: 30 mg/dL — AB
Specific Gravity, Urine: 1.024 (ref 1.005–1.030)
pH: 5 (ref 5.0–8.0)

## 2021-02-02 LAB — LACTIC ACID, PLASMA: Lactic Acid, Venous: 2.5 mmol/L (ref 0.5–1.9)

## 2021-02-02 IMAGING — CR DG FOOT 2V*L*
2 series · 2 of 2 positions shown · non-contrast
Comparison: None.

CLINICAL DATA: Infected wound great toe

EXAM:
LEFT FOOT - 2 VIEW

[foot ap]
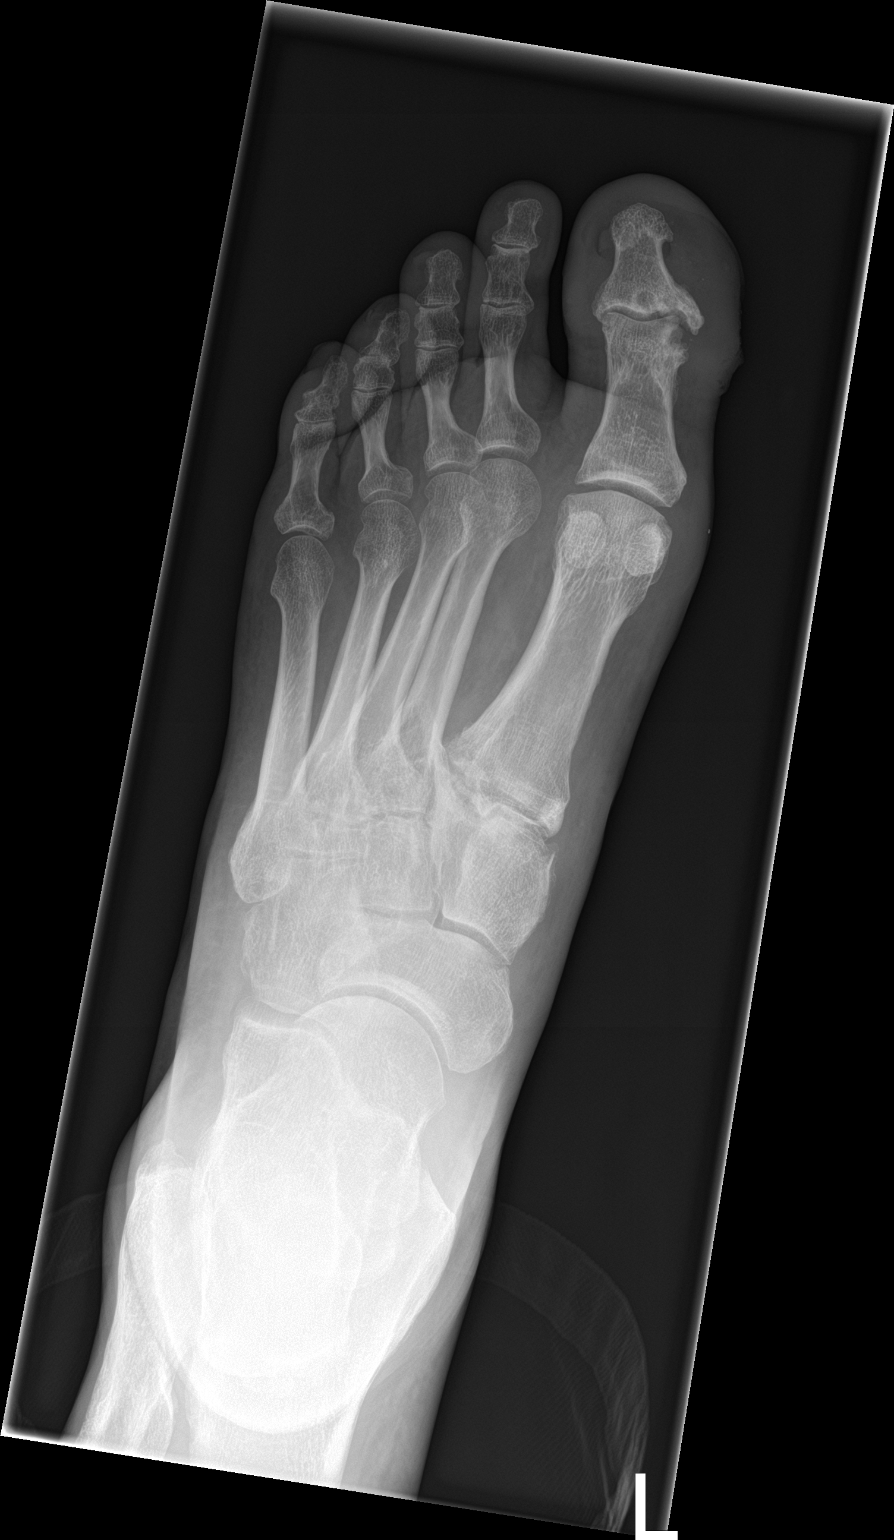

[foot lat]
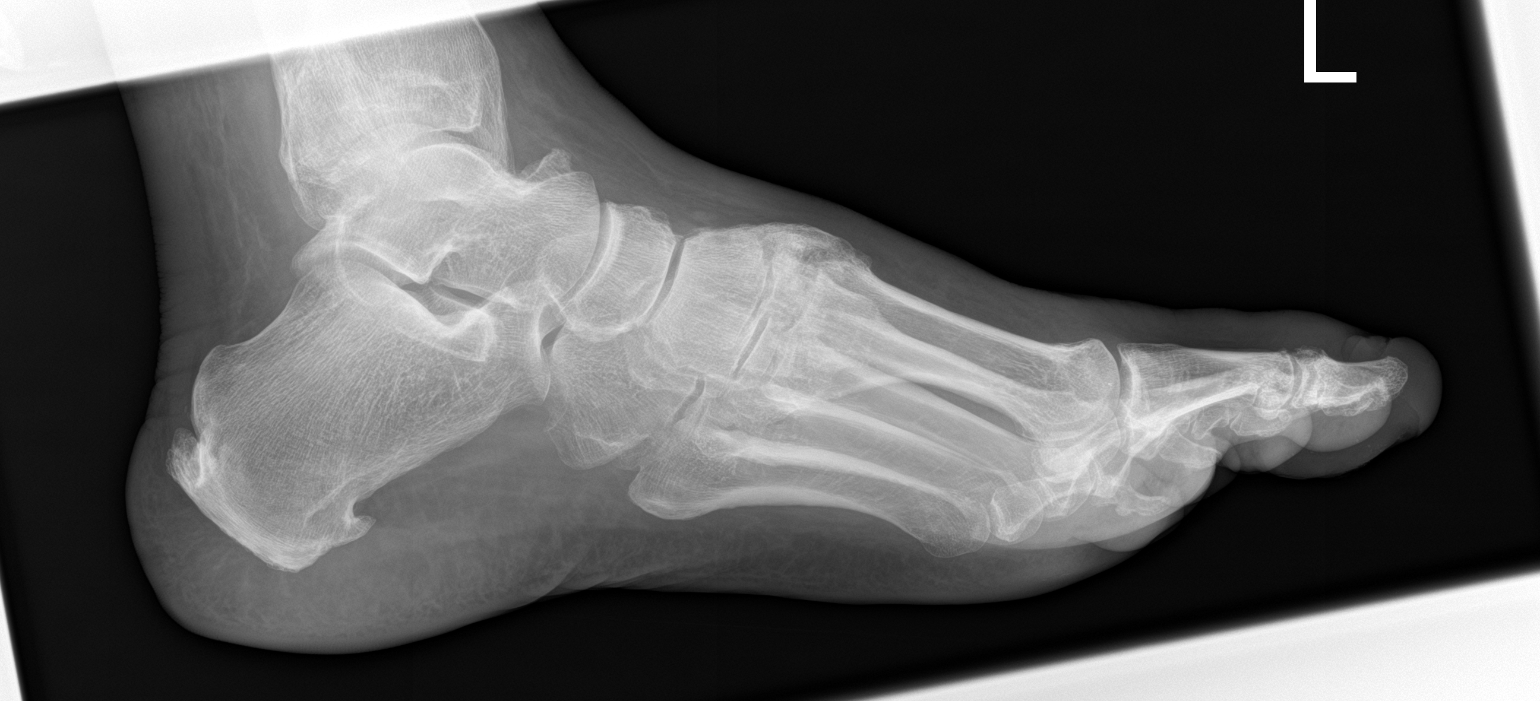

[2 of 2 positions shown; findings below may reference images not displayed]

FINDINGS: Extensive soft tissue swelling and ulceration of the first digit is
seen medial to the interphalangeal joint with subjacent areas of
age-indeterminate ulceration on a background of more diffuse
spurring. There is a questionable lucency through a osteophyte along
the medial base of the first distal phalanx as well which could
reflect an acute or cut subacute fracture. Few punctate
indeterminate radiodensities are present in the soft tissues,
possibly related to erosive changes. Additional degenerative changes
including subcortical cystic features about the first
interphalangeal joint as well. More diffuse degenerative changes
throughout the foot elsewhere are fairly mild-to-moderate. No other
acute or conspicuous osseous abnormalities. Bidirectional calcaneal
spur. Vascular calcium in the soft tissues.
IMPRESSION: Soft tissue swelling and ulceration along the medial first digit at
the level of the interphalangeal joint.

There is erosive change about the level of the first interphalangeal
joint conspicuous for subjacent osteomyelitis.

Questionable lucency through a large spur at the base of the first
distal phalanx, could reflect a small fracture line as well.

More diffuse mild-to-moderate degenerative changes throughout the
foot.

Bidirectional calcaneal spurs.

## 2021-02-02 IMAGING — CR DG CHEST 2V
2 series · 2 of 2 positions shown · non-contrast
Comparison: None.

CLINICAL DATA: Suspected sepsis

EXAM:
CHEST - 2 VIEW

[chest lat]
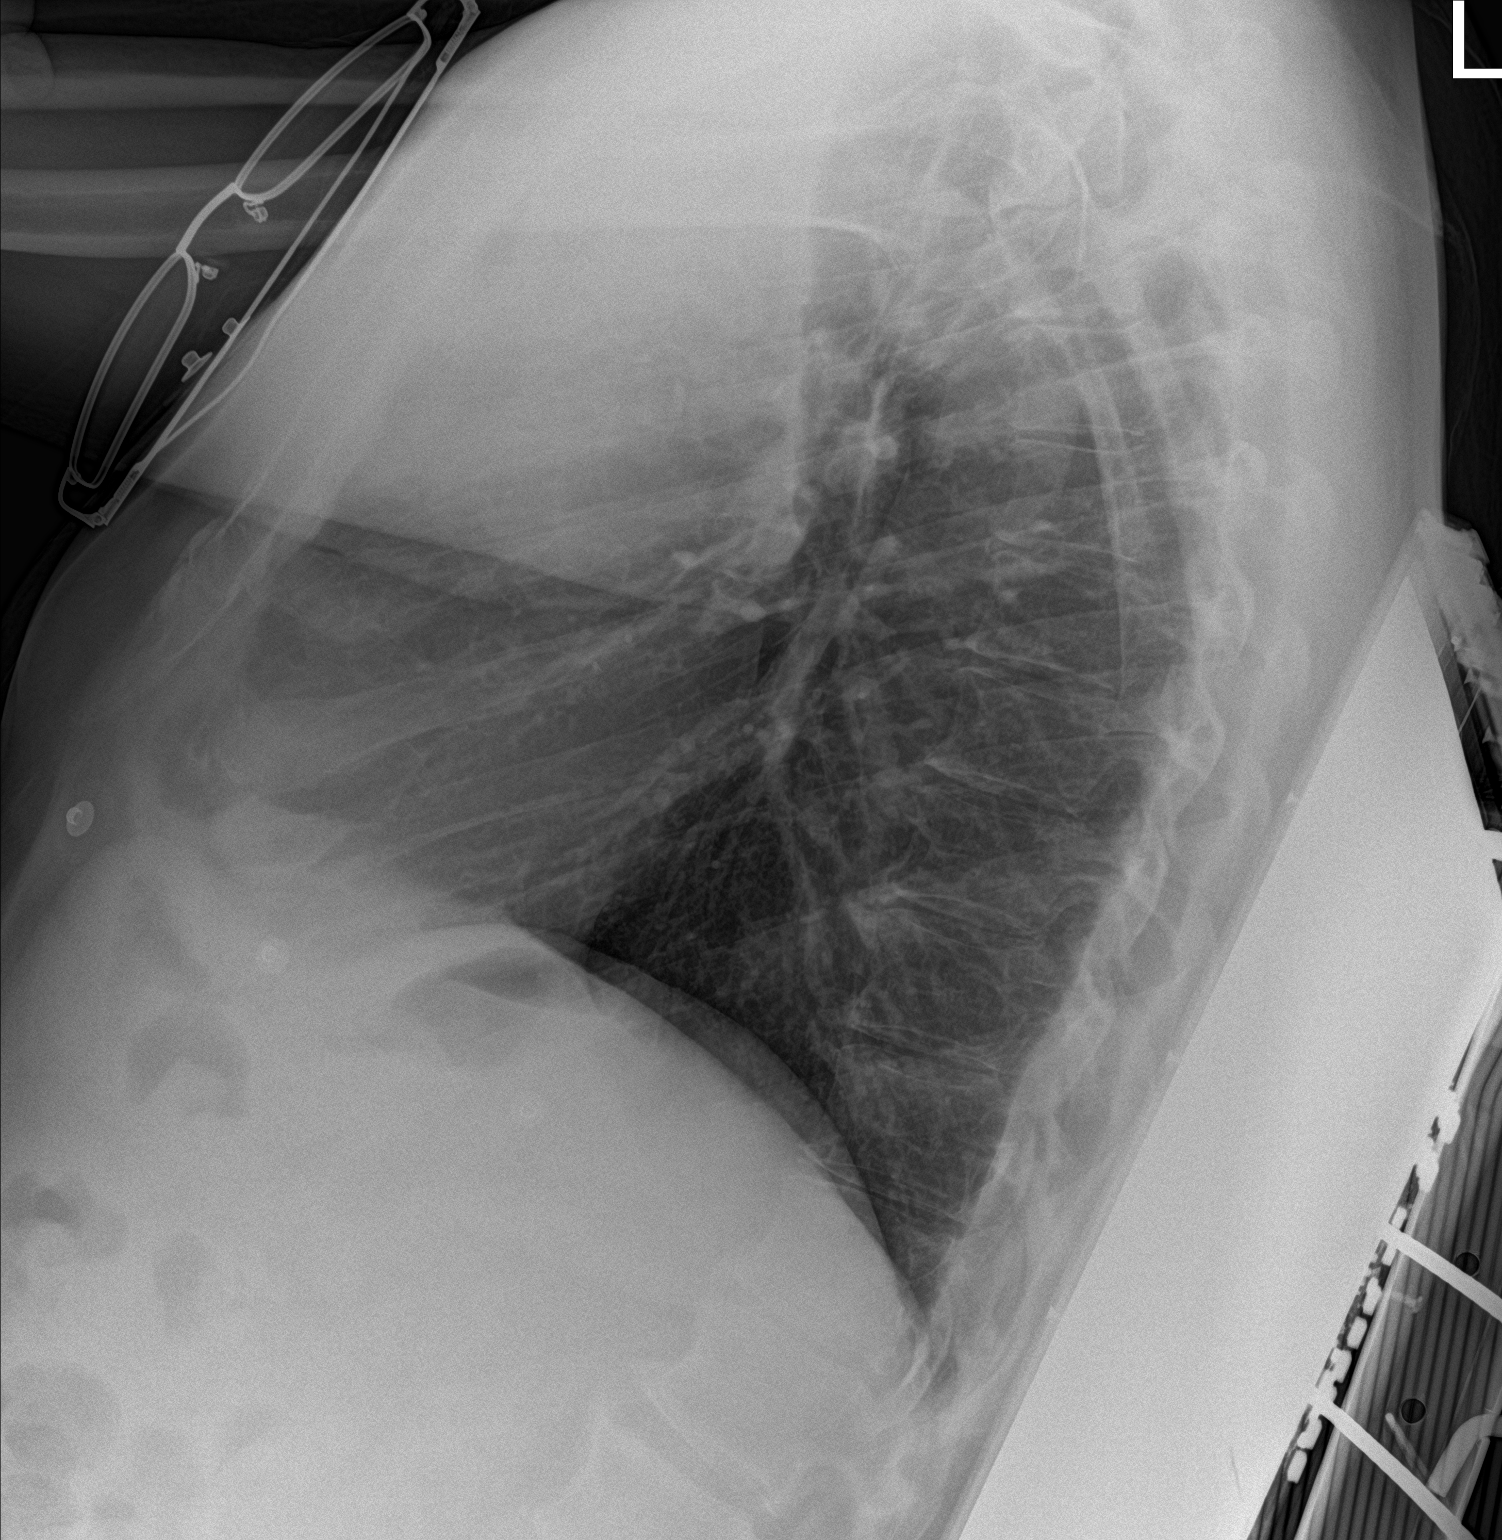

[chest ap]
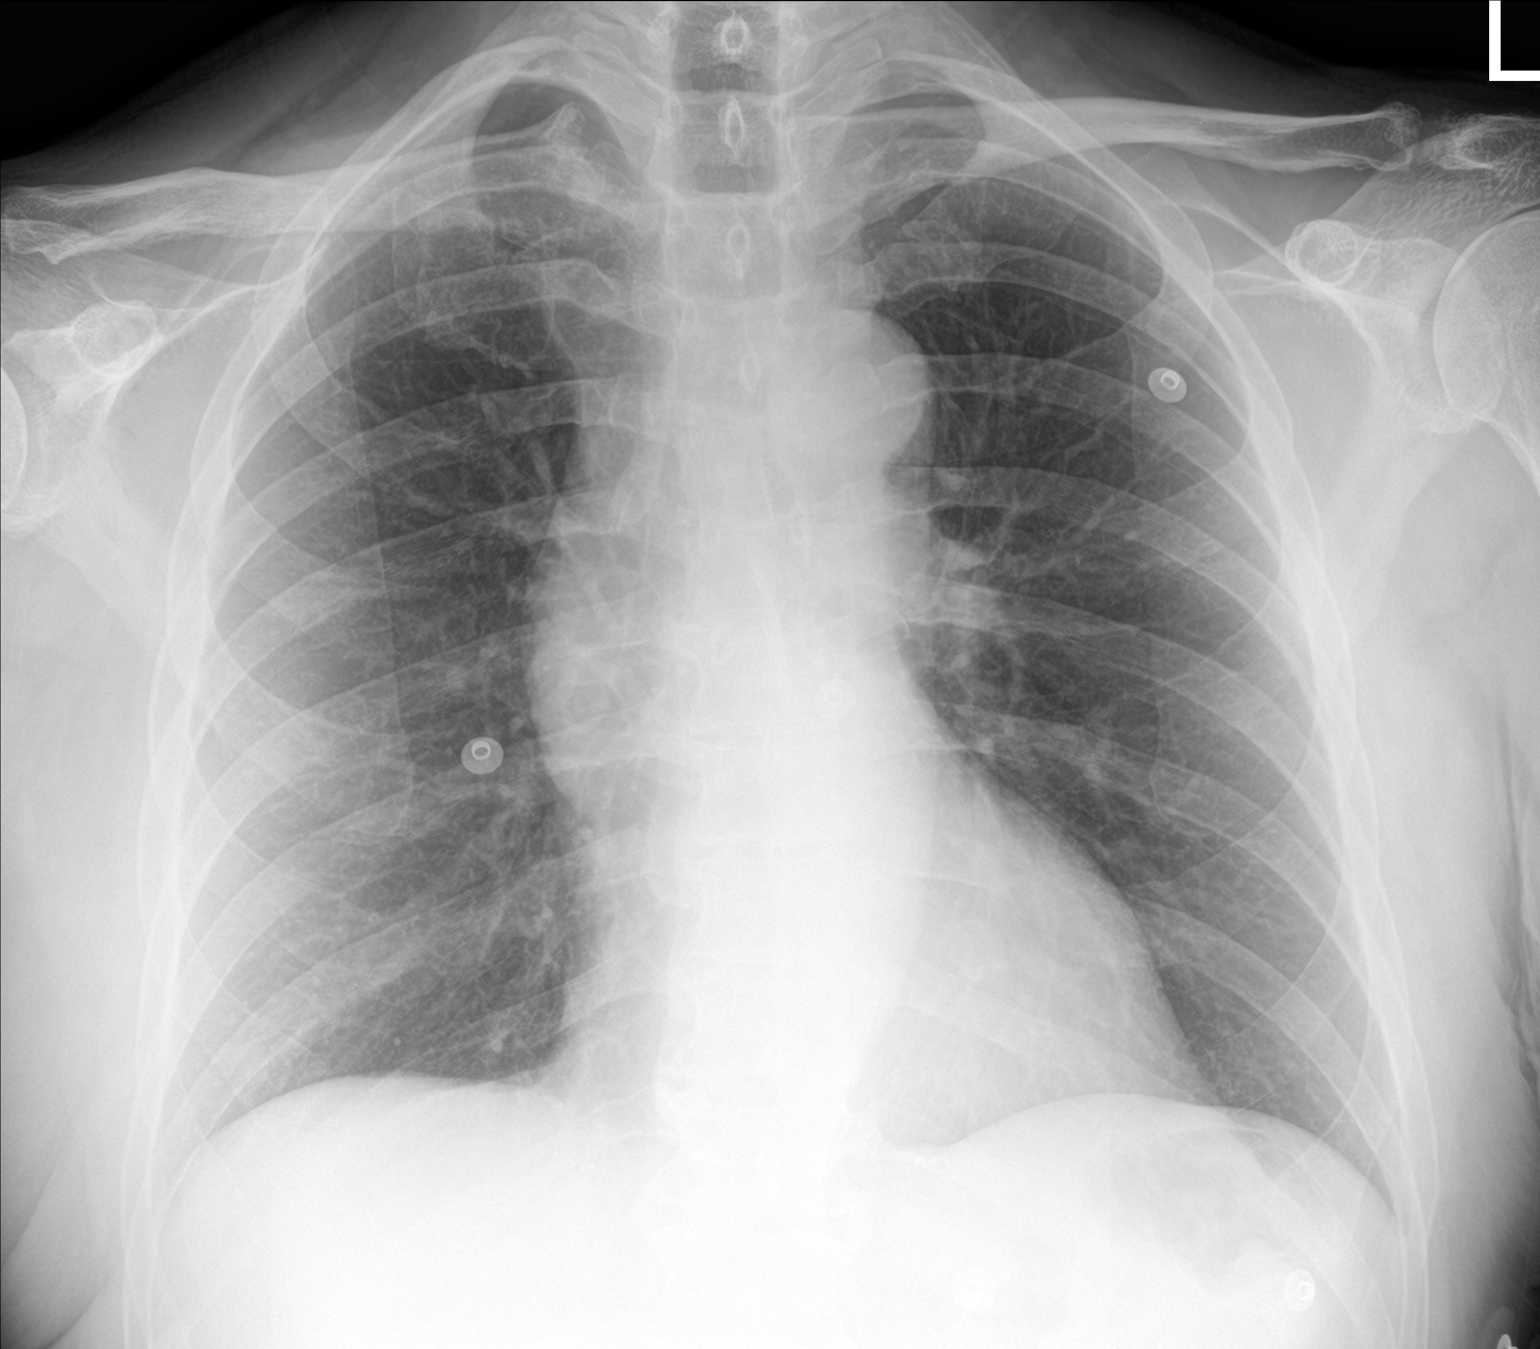

[2 of 2 positions shown; findings below may reference images not displayed]

FINDINGS: No consolidation, features of edema, pneumothorax, or effusion.
Pulmonary vascularity is normally distributed. The cardiomediastinal
contours are unremarkable. No acute osseous or soft tissue
abnormality. Degenerative changes are present in the imaged spine
and shoulders.
IMPRESSION: No acute cardiopulmonary abnormality.

## 2021-02-02 MED ORDER — PIPERACILLIN-TAZOBACTAM 3.375 G IVPB 30 MIN
3.3750 g | Freq: Once | INTRAVENOUS | Status: AC
  Administered 2021-02-03: 3.375 g via INTRAVENOUS
  Filled 2021-02-02: qty 50

## 2021-02-02 MED ORDER — ACETAMINOPHEN 325 MG PO TABS
650.0000 mg | ORAL_TABLET | Freq: Once | ORAL | Status: AC
  Administered 2021-02-02: 650 mg via ORAL
  Filled 2021-02-02: qty 2

## 2021-02-02 MED ORDER — VANCOMYCIN HCL 1750 MG/350ML IV SOLN
1750.0000 mg | Freq: Once | INTRAVENOUS | Status: AC
  Administered 2021-02-03: 1750 mg via INTRAVENOUS
  Filled 2021-02-02: qty 350

## 2021-02-02 MED ORDER — FENTANYL CITRATE (PF) 100 MCG/2ML IJ SOLN
50.0000 ug | Freq: Once | INTRAMUSCULAR | Status: AC
  Administered 2021-02-03: 50 ug via INTRAVENOUS
  Filled 2021-02-02: qty 2

## 2021-02-02 MED ORDER — VANCOMYCIN HCL 1000 MG/200ML IV SOLN
1000.0000 mg | Freq: Two times a day (BID) | INTRAVENOUS | Status: AC
  Administered 2021-02-03 – 2021-02-04 (×2): 1000 mg via INTRAVENOUS
  Filled 2021-02-02 (×4): qty 200

## 2021-02-02 NOTE — ED Provider Notes (Signed)
MSE was initiated and I personally evaluated the patient and placed orders (if any) at  10:25 PM on Feb 02, 2021.  Patient here with left great toe infection.  Has diabetes and peripheral neuropathy.  Stepped on a week ago.  Has developed significant infection to the great toe.  There is cellulitis extending to the midfoot.  Labs and antibiotics ordered.  Vitals:   02/02/21 2158  BP: (!) 190/103  Pulse: 94  Resp: 17  Temp: 98.3 F (36.8 C)  SpO2: 100%     Discussed with patient that their care has been initiated.   They are counseled that they will need to remain in the ED until the completion of their workup, including full H&P and results of any tests.  Risks of leaving the emergency department prior to completion of treatment were discussed. Patient was advised to inform ED staff if they are leaving before their treatment is complete. The patient acknowledged these risks and time was allowed for questions.    The patient appears stable so that the remainder of the MSE may be completed by another provider.    Roxy Horseman, PA-C 02/02/21 2226    Tegeler, Canary Brim, MD 02/02/21 [phone]

## 2021-02-02 NOTE — ED Triage Notes (Signed)
Brought in by [facility] from home, pt stepped on a tack about a week ago. Pt has diabetic neuropathy. Pt noticed throbbing and pain to his left foot with pus like discharge on left big toe and headache.  BGL 358mg /dl with EMS. BP 230/140

## 2021-02-02 NOTE — Progress Notes (Signed)
Pharmacy Antibiotic Note  Brett White is a 60 y.o. male admitted on 02/02/2021 with cellulitis, L-toe infection with cellulitis extending to midfoot, LA 2.5.  Pharmacy has been consulted for vancomycin dosing.  Zosyn per MD  Plan: Vancomycin 1750 mg IV x 1, then 1000 mg IV every 12 hours (eAUC 452, Goal AUC 400-550, SCr 1.38) Monitor renal function, clinical progression/LOT and ability to transition to PO Vancomycin levels as indicated  Height: 6\' 3"  (190.5 cm) Weight: 97.5 kg (214 lb 15.2 oz) IBW/kg (Calculated) : 84.5  Temp (24hrs), Avg:98.3 F (36.8 C), Min:98.3 F (36.8 C), Max:98.3 F (36.8 C)  Recent Labs  Lab 02/02/21 2220  WBC 8.2  CREATININE 1.38*  LATICACIDVEN 2.5*    Estimated Creatinine Clearance: 70.6 mL/min (A) (by C-G formula based on SCr of 1.38 mg/dL (H)).    No Known Allergies  04/04/21, PharmD Clinical Pharmacist ED Pharmacist Phone # [phone] 02/02/2021 11:26 PM

## 2021-02-02 NOTE — ED Notes (Signed)
Patient transported to X-ray 

## 2021-02-03 ENCOUNTER — Encounter (HOSPITAL_COMMUNITY): Payer: Self-pay | Admitting: Internal Medicine

## 2021-02-03 ENCOUNTER — Observation Stay (HOSPITAL_COMMUNITY): Payer: Medicare HMO

## 2021-02-03 ENCOUNTER — Other Ambulatory Visit: Payer: Self-pay | Admitting: Physician Assistant

## 2021-02-03 DIAGNOSIS — E875 Hyperkalemia: Secondary | ICD-10-CM | POA: Diagnosis not present

## 2021-02-03 DIAGNOSIS — E869 Volume depletion, unspecified: Secondary | ICD-10-CM | POA: Diagnosis present

## 2021-02-03 DIAGNOSIS — I152 Hypertension secondary to endocrine disorders: Secondary | ICD-10-CM | POA: Diagnosis present

## 2021-02-03 DIAGNOSIS — Z8673 Personal history of transient ischemic attack (TIA), and cerebral infarction without residual deficits: Secondary | ICD-10-CM | POA: Diagnosis not present

## 2021-02-03 DIAGNOSIS — I10 Essential (primary) hypertension: Secondary | ICD-10-CM | POA: Diagnosis present

## 2021-02-03 DIAGNOSIS — E1165 Type 2 diabetes mellitus with hyperglycemia: Secondary | ICD-10-CM | POA: Diagnosis present

## 2021-02-03 DIAGNOSIS — E872 Acidosis, unspecified: Secondary | ICD-10-CM | POA: Diagnosis present

## 2021-02-03 DIAGNOSIS — M869 Osteomyelitis, unspecified: Secondary | ICD-10-CM | POA: Diagnosis present

## 2021-02-03 DIAGNOSIS — Z20822 Contact with and (suspected) exposure to covid-19: Secondary | ICD-10-CM | POA: Diagnosis not present

## 2021-02-03 DIAGNOSIS — E871 Hypo-osmolality and hyponatremia: Secondary | ICD-10-CM | POA: Diagnosis not present

## 2021-02-03 DIAGNOSIS — Z79899 Other long term (current) drug therapy: Secondary | ICD-10-CM | POA: Diagnosis not present

## 2021-02-03 DIAGNOSIS — M86172 Other acute osteomyelitis, left ankle and foot: Secondary | ICD-10-CM | POA: Diagnosis not present

## 2021-02-03 DIAGNOSIS — E1169 Type 2 diabetes mellitus with other specified complication: Secondary | ICD-10-CM | POA: Diagnosis not present

## 2021-02-03 DIAGNOSIS — E1142 Type 2 diabetes mellitus with diabetic polyneuropathy: Secondary | ICD-10-CM | POA: Diagnosis not present

## 2021-02-03 DIAGNOSIS — Z9114 Patient's other noncompliance with medication regimen: Secondary | ICD-10-CM | POA: Diagnosis not present

## 2021-02-03 DIAGNOSIS — L97529 Non-pressure chronic ulcer of other part of left foot with unspecified severity: Secondary | ICD-10-CM | POA: Diagnosis present

## 2021-02-03 DIAGNOSIS — Z794 Long term (current) use of insulin: Secondary | ICD-10-CM | POA: Diagnosis present

## 2021-02-03 DIAGNOSIS — I951 Orthostatic hypotension: Secondary | ICD-10-CM | POA: Diagnosis not present

## 2021-02-03 DIAGNOSIS — L03032 Cellulitis of left toe: Secondary | ICD-10-CM | POA: Diagnosis present

## 2021-02-03 DIAGNOSIS — E119 Type 2 diabetes mellitus without complications: Secondary | ICD-10-CM | POA: Diagnosis present

## 2021-02-03 DIAGNOSIS — Z9119 Patient's noncompliance with other medical treatment and regimen: Secondary | ICD-10-CM | POA: Diagnosis not present

## 2021-02-03 DIAGNOSIS — Z7984 Long term (current) use of oral hypoglycemic drugs: Secondary | ICD-10-CM | POA: Diagnosis not present

## 2021-02-03 DIAGNOSIS — L03116 Cellulitis of left lower limb: Secondary | ICD-10-CM | POA: Diagnosis not present

## 2021-02-03 DIAGNOSIS — E11621 Type 2 diabetes mellitus with foot ulcer: Secondary | ICD-10-CM | POA: Diagnosis present

## 2021-02-03 HISTORY — DX: Osteomyelitis, unspecified: M86.9

## 2021-02-03 LAB — CBC WITH DIFFERENTIAL/PLATELET
Abs Immature Granulocytes: 0.04 10*3/uL (ref 0.00–0.07)
Basophils Absolute: 0 10*3/uL (ref 0.0–0.1)
Basophils Relative: 1 %
Eosinophils Absolute: 0.1 10*3/uL (ref 0.0–0.5)
Eosinophils Relative: 1 %
HCT: 39.9 % (ref 39.0–52.0)
Hemoglobin: 12.9 g/dL — ABNORMAL LOW (ref 13.0–17.0)
Immature Granulocytes: 1 %
Lymphocytes Relative: 26 %
Lymphs Abs: 2 10*3/uL (ref 0.7–4.0)
MCH: 28.8 pg (ref 26.0–34.0)
MCHC: 32.3 g/dL (ref 30.0–36.0)
MCV: 89.1 fL (ref 80.0–100.0)
Monocytes Absolute: 0.5 10*3/uL (ref 0.1–1.0)
Monocytes Relative: 7 %
Neutro Abs: 4.9 10*3/uL (ref 1.7–7.7)
Neutrophils Relative %: 64 %
Platelets: 312 10*3/uL (ref 150–400)
RBC: 4.48 MIL/uL (ref 4.22–5.81)
RDW: 12.2 % (ref 11.5–15.5)
WBC: 7.7 10*3/uL (ref 4.0–10.5)
nRBC: 0 % (ref 0.0–0.2)

## 2021-02-03 LAB — GLUCOSE, CAPILLARY
Glucose-Capillary: 253 mg/dL — ABNORMAL HIGH (ref 70–99)
Glucose-Capillary: 118 mg/dL — ABNORMAL HIGH (ref 70–99)
Glucose-Capillary: 238 mg/dL — ABNORMAL HIGH (ref 70–99)
Glucose-Capillary: 216 mg/dL — ABNORMAL HIGH (ref 70–99)

## 2021-02-03 LAB — COMPREHENSIVE METABOLIC PANEL
ALT: 9 U/L (ref 0–44)
AST: 12 U/L — ABNORMAL LOW (ref 15–41)
Albumin: 3.5 g/dL (ref 3.5–5.0)
Alkaline Phosphatase: 75 U/L (ref 38–126)
Anion gap: 7 (ref 5–15)
BUN: 21 mg/dL — ABNORMAL HIGH (ref 6–20)
CO2: 24 mmol/L (ref 22–32)
Calcium: 8.8 mg/dL — ABNORMAL LOW (ref 8.9–10.3)
Chloride: 103 mmol/L (ref 98–111)
Creatinine, Ser: 1.32 mg/dL — ABNORMAL HIGH (ref 0.61–1.24)
GFR, Estimated: >60 mL/min (ref >60)
Glucose, Bld: 259 mg/dL — ABNORMAL HIGH (ref 70–99)
Potassium: 3.8 mmol/L (ref 3.5–5.1)
Sodium: 134 mmol/L — ABNORMAL LOW (ref 135–145)
Total Bilirubin: 0.7 mg/dL (ref 0.3–1.2)
Total Protein: 7.2 g/dL (ref 6.5–8.1)

## 2021-02-03 LAB — HEMOGLOBIN A1C
Hgb A1c MFr Bld: 11.5 % — ABNORMAL HIGH (ref 4.8–5.6)
Mean Plasma Glucose: 283.35 mg/dL

## 2021-02-03 LAB — HIV ANTIBODY (ROUTINE TESTING W REFLEX): HIV Screen 4th Generation wRfx: NONREACTIVE

## 2021-02-03 LAB — RESP PANEL BY RT-PCR (FLU A&B, COVID) ARPGX2
Influenza A by PCR: NEGATIVE
Influenza B by PCR: NEGATIVE
SARS Coronavirus 2 by RT PCR: NEGATIVE

## 2021-02-03 LAB — LACTIC ACID, PLASMA: Lactic Acid, Venous: 1 mmol/L (ref 0.5–1.9)

## 2021-02-03 LAB — MAGNESIUM: Magnesium: 2.1 mg/dL (ref 1.7–2.4)

## 2021-02-03 IMAGING — MR MR FOOT*L* W/O CM
6 series · 40 of 40 positions shown · non-contrast
Comparison: Radiographs [DATE]

CLINICAL DATA: Osteomyelitis.  Hypertension and diabetes.

EXAM:
MRI OF THE LEFT FOOT WITHOUT CONTRAST
TECHNIQUE: Multiplanar, multisequence MR imaging of the left forefoot was
performed. No intravenous contrast was administered.

[Series 9: T1 · coronal · left · 3.0mm · 0.47mm/px · 9 of 60 slices shown (1 of 2)]
[im 1/60]
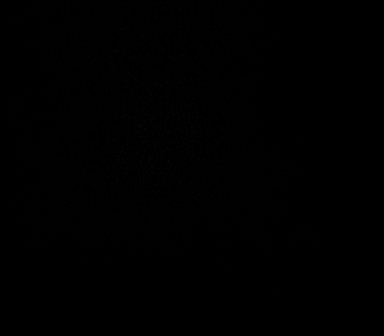
[im 8/60]
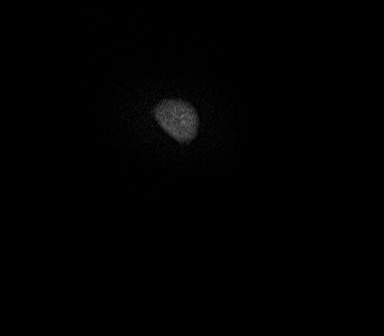
[im 15/60]
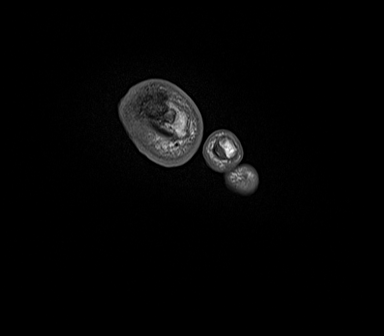
[im 23/60]
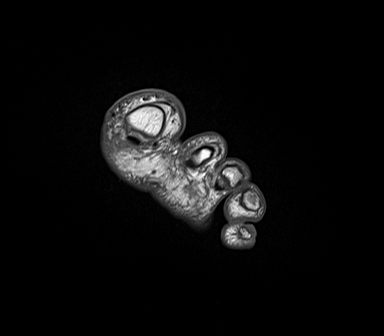
[im 30/60]
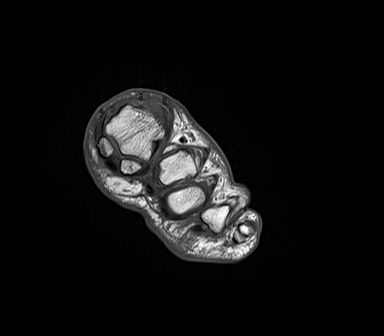
[im 37/60]
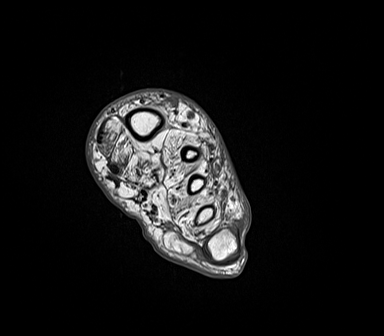
[im 45/60]
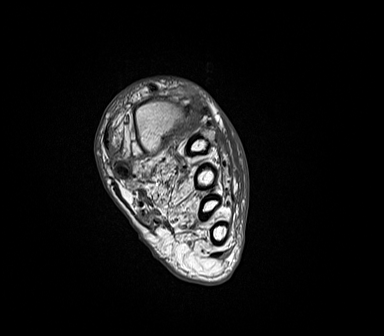
[im 52/60]
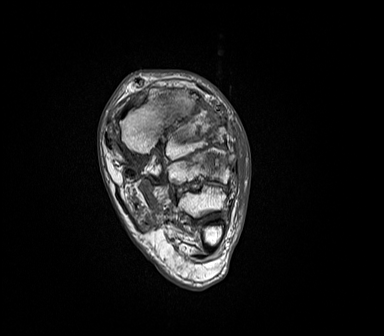
[im 60/60]
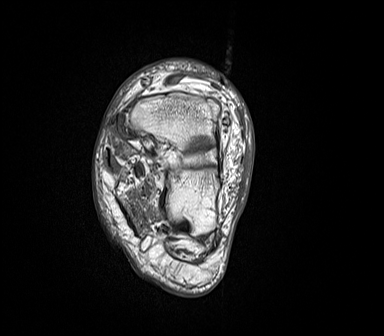

[Series 10: T2 fat-sat · coronal · left · 3.0mm · 0.49mm/px · 10 of 57 slices shown (1 of 3)]
[im 1/57]
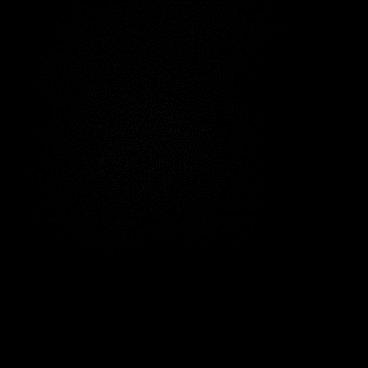
[im 7/57]
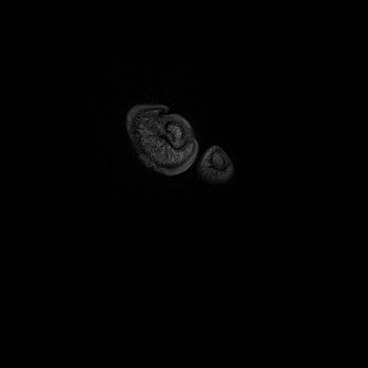
[im 13/57]
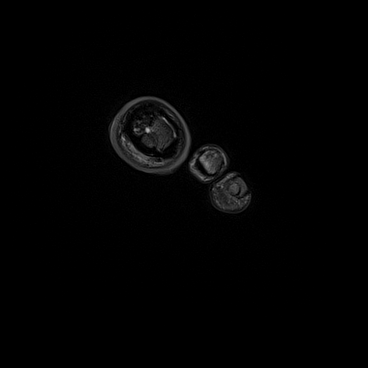
[im 19/57]
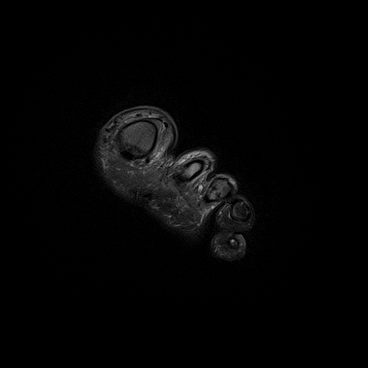
[im 25/57]
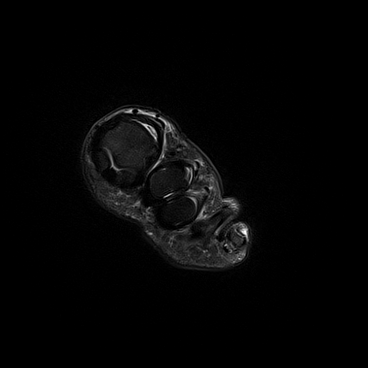
[im 32/57]
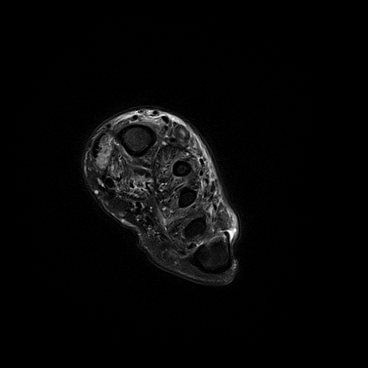
[im 38/57]
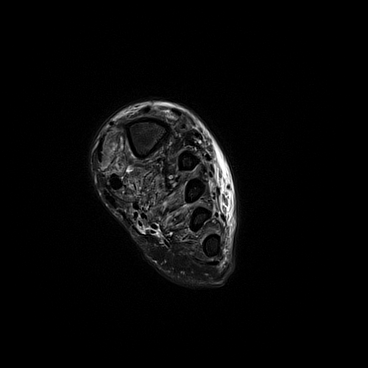
[im 44/57]
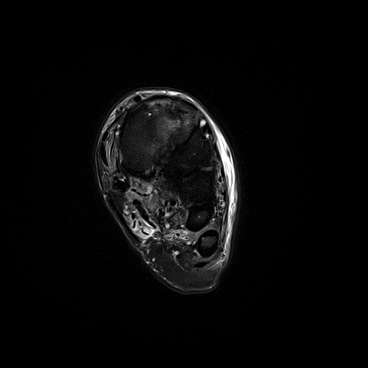
[im 50/57]
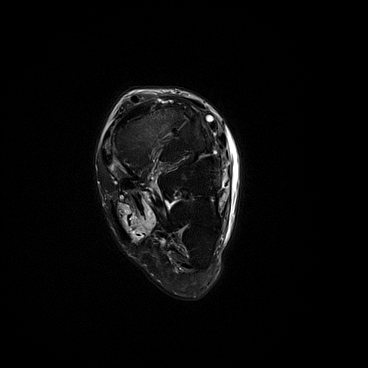
[im 57/57]
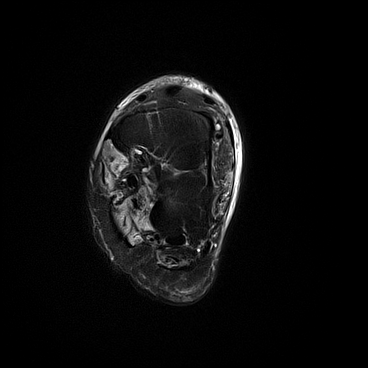

[Series 12: STIR · sagittal · left · 3.0mm · 0.78mm/px · 6 of 33 slices shown]
[im 1/33]
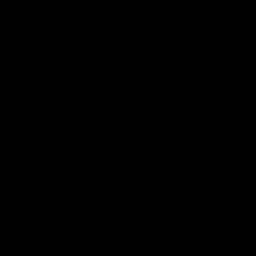
[im 7/33]
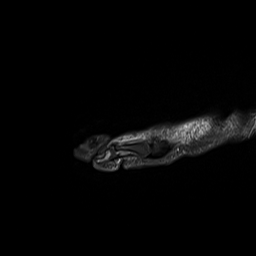
[im 13/33]
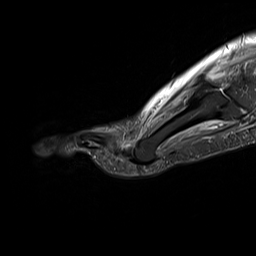
[im 20/33]
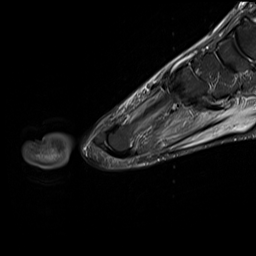
[im 26/33]
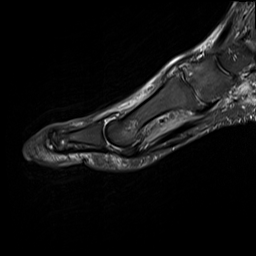
[im 33/33]
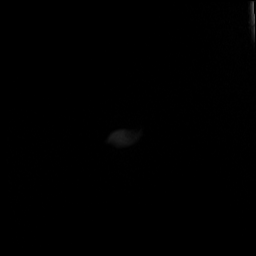

[Series 13: T2 fat-sat · oblique · left · 3.0mm · 0.60mm/px · 5 of 28 slices shown (2 of 3)]
[im 1/28]
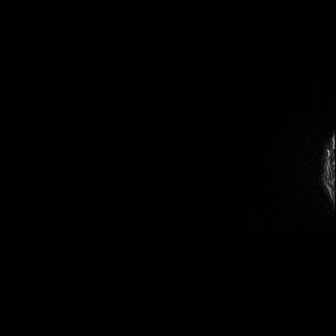
[im 7/28]
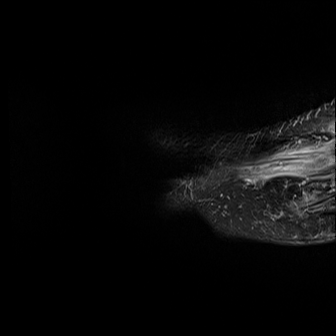
[im 14/28]
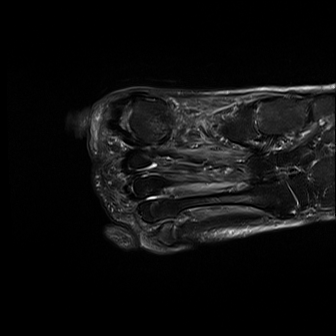
[im 21/28]
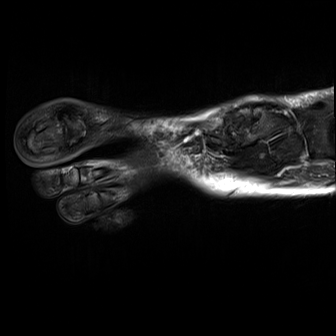
[im 28/28]
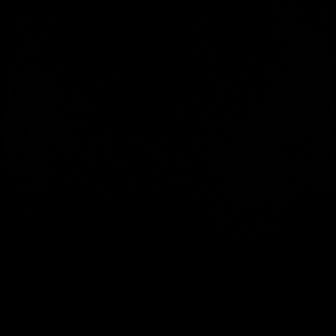

[Series 1014: T1 · oblique · left · 3.0mm · 0.57mm/px · 5 of 28 slices shown (2 of 2)]
[im 1/28]
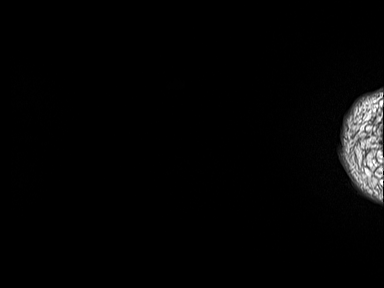
[im 7/28]
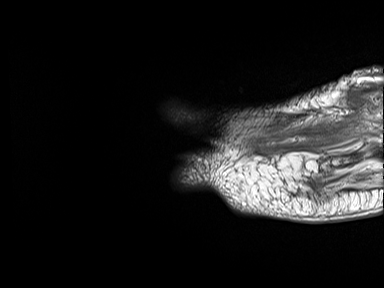
[im 14/28]
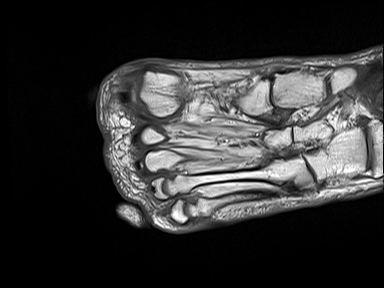
[im 21/28]
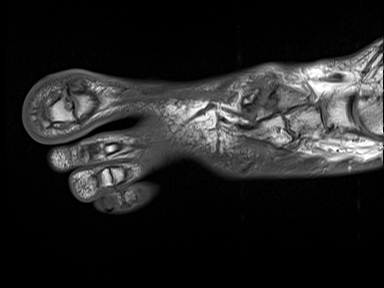
[im 28/28]
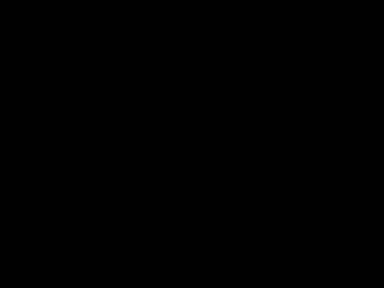

[Series 1030: T2 fat-sat · oblique · left · 3.0mm · 0.60mm/px · 5 of 28 slices shown (3 of 3)]
[im 1/28]
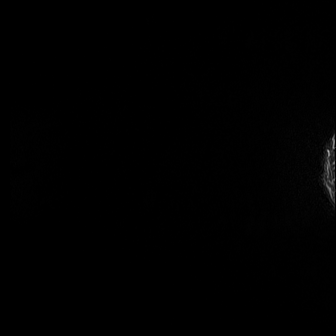
[im 7/28]
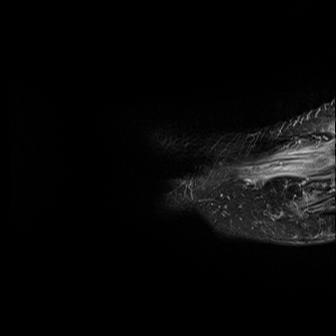
[im 14/28]
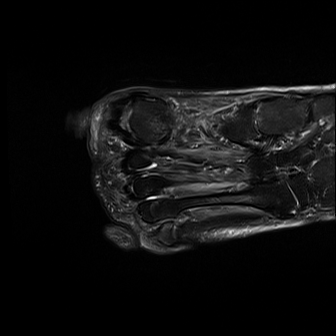
[im 21/28]
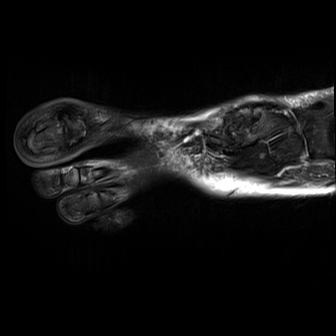
[im 28/28]
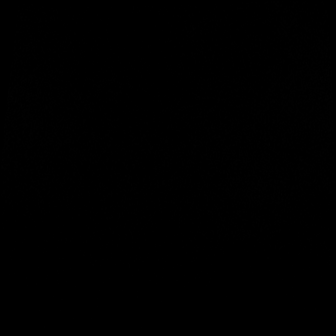

[40 of 40 positions shown; findings below may reference images not displayed]

FINDINGS: Bones/Joint/Cartilage

Erosions of the medial head of the proximal phalanx of the great toe
and of the medial base of the distal phalanx of the great toe are
present along with marrow edema in the distal phalanx and distally
in the proximal phalanx. Although gout arthropathy could cause a
similar imaging appearance, the appearance is concerning for
osteomyelitis given the clinical scenario.

This also on appearance of suspected marrow edema in the distal
phalanges of the third and fifth toes such that osteomyelitis cannot
be excluded in these locations either.

Notable arthropathy at the Lisfranc joint especially along the first
digit dorsally, with subcortical marrow edema, degenerative
subcortical cysts, and notable spurring. No malalignment at the
Lisfranc joint.

Ligaments

The Lisfranc ligament appears intact.

Muscles and Tendons

Diffuse edema within regional musculature, probably neurogenic given
the diffuse appearance.

Soft tissues

Dorsal subcutaneous edema in the foot tracking into the toes,
nonspecific but cellulitis is not excluded. Edema in the toes is
most confluent in the great toe.
IMPRESSION: 1. Substantial erosive arthropathy at the interphalangeal joint of
the great toe medially, with associated marrow edema in the distal
phalanx and distally in the proximal phalanx. Given the swelling in
the toe and clinical context, the appearance is suspicious for
osteomyelitis, with gout arthropathy being a less likely
differential diagnostic consideration.
2. Marrow edema in the distal phalanx small toe could also be from
osteomyelitis. Marrow edema in the distal phalanx of the third toe
is not as convincing but is still abnormal.
3. Dorsal subcutaneous edema in the foot may be from cellulitis.
4. Diffuse muscular edema probably neurogenic.
5. Arthropathy at the Lisfranc joint especially medially. No
Lisfranc joint malalignment, the Lisfranc ligament appears intact.

## 2021-02-03 MED ORDER — LISINOPRIL 20 MG PO TABS
20.0000 mg | ORAL_TABLET | Freq: Every day | ORAL | Status: DC
  Administered 2021-02-03 – 2021-02-13 (×10): 20 mg via ORAL
  Filled 2021-02-03 (×11): qty 1

## 2021-02-03 MED ORDER — GABAPENTIN 300 MG PO CAPS: 300.0000 mg | ORAL_CAPSULE | Freq: Every day | ORAL | Status: DC

## 2021-02-03 MED ORDER — INSULIN STARTER KIT- PEN NEEDLES (ENGLISH)
1.0000 | Freq: Once | Status: DC
  Filled 2021-02-03: qty 1

## 2021-02-03 MED ORDER — INSULIN ASPART 100 UNIT/ML IJ SOLN
0.0000 [IU] | Freq: Three times a day (TID) | INTRAMUSCULAR | Status: DC
  Administered 2021-02-03: 5 [IU] via SUBCUTANEOUS
  Administered 2021-02-03: 8 [IU] via SUBCUTANEOUS
  Administered 2021-02-03: 5 [IU] via SUBCUTANEOUS
  Administered 2021-02-04: 11 [IU] via SUBCUTANEOUS
  Administered 2021-02-04 – 2021-02-05 (×4): 5 [IU] via SUBCUTANEOUS
  Administered 2021-02-05: 2 [IU] via SUBCUTANEOUS
  Administered 2021-02-05 – 2021-02-06 (×2): 3 [IU] via SUBCUTANEOUS
  Administered 2021-02-06: 5 [IU] via SUBCUTANEOUS
  Administered 2021-02-06: 3 [IU] via SUBCUTANEOUS
  Administered 2021-02-06: 2 [IU] via SUBCUTANEOUS
  Administered 2021-02-07 (×3): 3 [IU] via SUBCUTANEOUS
  Administered 2021-02-07: 5 [IU] via SUBCUTANEOUS
  Administered 2021-02-08 (×2): 3 [IU] via SUBCUTANEOUS
  Administered 2021-02-08 – 2021-02-09 (×3): 5 [IU] via SUBCUTANEOUS
  Administered 2021-02-09: 2 [IU] via SUBCUTANEOUS
  Administered 2021-02-09: 3 [IU] via SUBCUTANEOUS
  Administered 2021-02-09: 5 [IU] via SUBCUTANEOUS
  Administered 2021-02-10: 3 [IU] via SUBCUTANEOUS
  Administered 2021-02-10 (×2): 2 [IU] via SUBCUTANEOUS
  Administered 2021-02-11: 8 [IU] via SUBCUTANEOUS
  Administered 2021-02-11: 5 [IU] via SUBCUTANEOUS
  Administered 2021-02-11: 3 [IU] via SUBCUTANEOUS
  Administered 2021-02-11: 8 [IU] via SUBCUTANEOUS
  Administered 2021-02-12: 5 [IU] via SUBCUTANEOUS
  Administered 2021-02-12 (×2): 3 [IU] via SUBCUTANEOUS
  Administered 2021-02-12 – 2021-02-13 (×2): 8 [IU] via SUBCUTANEOUS
  Administered 2021-02-13: 5 [IU] via SUBCUTANEOUS
  Administered 2021-02-13: 3 [IU] via SUBCUTANEOUS
  Administered 2021-02-14: 5 [IU] via SUBCUTANEOUS
  Administered 2021-02-14 (×2): 8 [IU] via SUBCUTANEOUS
  Administered 2021-02-14: 3 [IU] via SUBCUTANEOUS
  Administered 2021-02-15: 8 [IU] via SUBCUTANEOUS
  Administered 2021-02-15: 3 [IU] via SUBCUTANEOUS
  Administered 2021-02-15: 2 [IU] via SUBCUTANEOUS
  Administered 2021-02-15 – 2021-02-16 (×2): 5 [IU] via SUBCUTANEOUS
  Administered 2021-02-16 (×2): 2 [IU] via SUBCUTANEOUS
  Administered 2021-02-16: 5 [IU] via SUBCUTANEOUS
  Administered 2021-02-17: 3 [IU] via SUBCUTANEOUS
  Administered 2021-02-17: 2 [IU] via SUBCUTANEOUS
  Administered 2021-02-17 – 2021-02-18 (×3): 3 [IU] via SUBCUTANEOUS
  Administered 2021-02-18: 5 [IU] via SUBCUTANEOUS
  Administered 2021-02-18: 2 [IU] via SUBCUTANEOUS
  Administered 2021-02-19: 3 [IU] via SUBCUTANEOUS

## 2021-02-03 MED ORDER — HYDRALAZINE HCL 20 MG/ML IJ SOLN: 10.0000 mg | Freq: Four times a day (QID) | INTRAMUSCULAR | Status: DC | PRN

## 2021-02-03 MED ORDER — ONDANSETRON HCL 4 MG/2ML IJ SOLN: 4.0000 mg | Freq: Four times a day (QID) | INTRAMUSCULAR | Status: DC | PRN

## 2021-02-03 MED ORDER — LIVING WELL WITH DIABETES BOOK
Freq: Once | Status: AC
  Filled 2021-02-03: qty 1

## 2021-02-03 MED ORDER — HYDROMORPHONE HCL 1 MG/ML IJ SOLN
0.5000 mg | INTRAMUSCULAR | Status: DC | PRN
  Administered 2021-02-03 – 2021-02-04 (×4): 0.5 mg via INTRAVENOUS
  Filled 2021-02-03 (×4): qty 0.5

## 2021-02-03 MED ORDER — SENNOSIDES-DOCUSATE SODIUM 8.6-50 MG PO TABS
1.0000 | ORAL_TABLET | Freq: Two times a day (BID) | ORAL | Status: DC
  Administered 2021-02-03 – 2021-02-19 (×28): 1 via ORAL
  Filled 2021-02-03 (×30): qty 1

## 2021-02-03 MED ORDER — CEFAZOLIN SODIUM-DEXTROSE 2-4 GM/100ML-% IV SOLN
2.0000 g | INTRAVENOUS | Status: DC
  Filled 2021-02-03: qty 100

## 2021-02-03 MED ORDER — ONDANSETRON HCL 4 MG PO TABS: 4.0000 mg | ORAL_TABLET | Freq: Four times a day (QID) | ORAL | Status: DC | PRN

## 2021-02-03 MED ORDER — GABAPENTIN 300 MG PO CAPS
300.0000 mg | ORAL_CAPSULE | Freq: Every day | ORAL | Status: DC
  Administered 2021-02-03 – 2021-02-11 (×10): 300 mg via ORAL
  Filled 2021-02-03 (×10): qty 1

## 2021-02-03 MED ORDER — KETOROLAC TROMETHAMINE 30 MG/ML IJ SOLN
30.0000 mg | Freq: Once | INTRAMUSCULAR | Status: AC
  Administered 2021-02-03: 30 mg via INTRAVENOUS
  Filled 2021-02-03: qty 1

## 2021-02-03 MED ORDER — SODIUM CHLORIDE 0.9 % IV SOLN
2.0000 g | INTRAVENOUS | Status: AC
  Administered 2021-02-03 – 2021-02-05 (×2): 2 g via INTRAVENOUS
  Filled 2021-02-03 (×2): qty 20

## 2021-02-03 MED ORDER — ACETAMINOPHEN 325 MG PO TABS: 650.0000 mg | ORAL_TABLET | Freq: Four times a day (QID) | ORAL | Status: DC | PRN

## 2021-02-03 MED ORDER — BISACODYL 10 MG RE SUPP: 10.0000 mg | Freq: Every day | RECTAL | Status: DC | PRN

## 2021-02-03 MED ORDER — DIPHENHYDRAMINE HCL 25 MG PO CAPS
25.0000 mg | ORAL_CAPSULE | Freq: Every evening | ORAL | Status: DC | PRN
  Administered 2021-02-03 – 2021-02-18 (×13): 25 mg via ORAL
  Filled 2021-02-03 (×14): qty 1

## 2021-02-03 MED ORDER — OXYCODONE-ACETAMINOPHEN 5-325 MG PO TABS: 1.0000 | ORAL_TABLET | ORAL | Status: DC | PRN

## 2021-02-03 MED ORDER — METRONIDAZOLE 500 MG/100ML IV SOLN
500.0000 mg | Freq: Three times a day (TID) | INTRAVENOUS | Status: AC
  Administered 2021-02-03 – 2021-02-04 (×4): 500 mg via INTRAVENOUS
  Filled 2021-02-03 (×4): qty 100

## 2021-02-03 MED ORDER — ACETAMINOPHEN 650 MG RE SUPP: 650.0000 mg | Freq: Four times a day (QID) | RECTAL | Status: DC | PRN

## 2021-02-03 MED ORDER — MORPHINE SULFATE (PF) 2 MG/ML IV SOLN
2.0000 mg | INTRAVENOUS | Status: DC | PRN
Start: 2021-02-03 — End: 2021-02-03
  Administered 2021-02-03: 2 mg via INTRAVENOUS
  Filled 2021-02-03: qty 1

## 2021-02-03 MED ORDER — SODIUM CHLORIDE 0.9 % IV SOLN: INTRAVENOUS | Status: AC

## 2021-02-03 MED ORDER — MORPHINE SULFATE (PF) 4 MG/ML IV SOLN
4.0000 mg | Freq: Once | INTRAVENOUS | Status: AC
  Administered 2021-02-03: 4 mg via INTRAVENOUS
  Filled 2021-02-03: qty 1

## 2021-02-03 MED ORDER — OXYCODONE-ACETAMINOPHEN 5-325 MG PO TABS
1.0000 | ORAL_TABLET | ORAL | Status: DC | PRN
  Administered 2021-02-03 – 2021-02-19 (×20): 2 via ORAL
  Filled 2021-02-03 (×20): qty 2

## 2021-02-03 MED ORDER — HYDROCHLOROTHIAZIDE 12.5 MG PO CAPS
12.5000 mg | ORAL_CAPSULE | Freq: Every day | ORAL | Status: DC
  Administered 2021-02-03 – 2021-02-13 (×10): 12.5 mg via ORAL
  Filled 2021-02-03 (×11): qty 1

## 2021-02-03 MED ORDER — MORPHINE SULFATE (PF) 2 MG/ML IV SOLN
2.0000 mg | INTRAVENOUS | Status: DC | PRN
  Administered 2021-02-03 (×2): 2 mg via INTRAVENOUS
  Filled 2021-02-03 (×2): qty 1

## 2021-02-03 MED ORDER — POLYETHYLENE GLYCOL 3350 17 G PO PACK: 17.0000 g | PACK | Freq: Every day | ORAL | Status: DC | PRN

## 2021-02-03 MED ORDER — ENOXAPARIN SODIUM 40 MG/0.4ML IJ SOSY: 40.0000 mg | PREFILLED_SYRINGE | Freq: Every day | INTRAMUSCULAR | Status: DC

## 2021-02-03 NOTE — Progress Notes (Signed)
Patient ID: Brett White, male   DOB: [birth year], 60 y.o.   MRN: [id number]  PROGRESS NOTE    Brett White  IRW:[id number] DOB: 12/21/1960 DOA: 02/02/2021 PCP: Patient, [name]   Brief Narrative:  60 year old male with history of hypertension, diabetes mellitus type 2, previous unspecified CVA in 2015, diabetic neuropathy presented with worsening left foot pain.  On presentation, left foot x-ray showed findings concerning for erosions of the first interphalangeal joint concerning for osteomyelitis.  He was started on broad-spectrum antibiotics.  Assessment & Plan:   Acute osteomyelitis of great toe of left foot with surrounding cellulitis -X-ray of the foot reveals bony destruction of the first interphalangeal joint concerning for osteomyelitis. -Currently on broad-spectrum antibiotics.  Will add Flagyl as well.  Follow blood cultures. -I have consulted orthopedic/Michael Tinnie Gens.  Will switch him to n.p.o. for now. -Pain management  Diabetes mellitus type 2 uncontrolled with hyperglycemia -Continue CBGs with SSI  Patient with hypertension -Not taking any antihypertensives for months.  Monitor blood pressure.  Continue hydrochlorothiazide and lisinopril  Diabetic neuropathy -Continue gabapentin  Mild hyponatremia -Monitor.  Continue IV fluids but decrease rate to 75 cc an hour   DVT prophylaxis: DC Lovenox in case patient needs surgical intervention Code Status: Full Family Communication: None at bedside Disposition Plan: Status is: Observation  The patient will require care spanning > 2 midnights and should be moved to inpatient because: Of severity of illness  Dispo: The patient is from: Home              Anticipated d/c is to: Home              Patient currently is not medically stable to d/c.   Difficult to place patient No   Consultants: Orthopedics  Procedures: None  Antimicrobials: Rocephin and vancomycin from 02/02/2021  onwards   Subjective: Patient seen and examined at bedside.  Complains of severe intermittent left foot pain, not getting much relief with pain medications.  No overnight fever or vomiting reported.  Objective: Vitals:   02/02/21 2200 02/03/21 0048 02/03/21 0345 02/03/21 0652  BP:  122/85 (!) 157/85 (!) 178/102  Pulse:  81 82 79  Resp:  18 20 16   Temp:    97.7 F (36.5 C)  TempSrc:      SpO2:  98% 99% 100%  Weight: 97.5 kg     Height: 6\' 3"  (1.905 m)      No intake or output data in the 24 hours ending 02/03/21 0753 Filed Weights   02/02/21 2200  Weight: 97.5 kg    Examination:  General exam: Appears calm and comfortable  Respiratory system: Bilateral decreased breath sounds at bases Cardiovascular system: S1 & S2 heard, Rate controlled Gastrointestinal system: Abdomen is nondistended, soft and nontender. Normal bowel sounds heard. Extremities: No cyanosis, clubbing; trace lower extremity edema. Central nervous system: Alert and oriented. No focal neurological deficits. Moving extremities Skin: Left great toe dressing present.  No other ecchymosis Psychiatry: Flat affect    Data Reviewed: I have personally reviewed following labs and imaging studies  CBC: Recent Labs  Lab 02/02/21 2220 02/03/21 0217  WBC 8.2 7.7  NEUTROABS 6.2 4.9  HGB 13.4 12.9*  HCT 41.3 39.9  MCV 88.8 89.1  PLT 335 312   Basic Metabolic Panel: Recent Labs  Lab 02/02/21 2220 02/03/21 0217  NA 135 134*  K 3.7 3.8  CL 100 103  CO2 25 24  GLUCOSE 311* 259*  BUN 20 21*  CREATININE 1.38* 1.32*  CALCIUM 8.9 8.8*  MG  --  2.1   GFR: Estimated Creatinine Clearance: 73.8 mL/min (A) (by C-G formula based on SCr of 1.32 mg/dL (H)). Liver Function Tests: Recent Labs  Lab 02/02/21 2220 02/03/21 0217  AST 16 12*  ALT 13 9  ALKPHOS 82 75  BILITOT 0.6 0.7  PROT 7.7 7.2  ALBUMIN 3.7 3.5   No results for input(s): LIPASE, AMYLASE in the last 168 hours. No results for input(s): AMMONIA  in the last 168 hours. Coagulation Profile: Recent Labs  Lab 02/02/21 2220  INR 1.0   Cardiac Enzymes: No results for input(s): CKTOTAL, CKMB, CKMBINDEX, TROPONINI in the last 168 hours. BNP (last 3 results) No results for input(s): PROBNP in the last 8760 hours. HbA1C: Recent Labs    02/03/21 0217  HGBA1C 11.5*   CBG: Recent Labs  Lab 02/03/21 0635  GLUCAP 253*   Lipid Profile: No results for input(s): CHOL, HDL, LDLCALC, TRIG, CHOLHDL, LDLDIRECT in the last 72 hours. Thyroid Function Tests: No results for input(s): TSH, T4TOTAL, FREET4, T3FREE, THYROIDAB in the last 72 hours. Anemia Panel: No results for input(s): VITAMINB12, FOLATE, FERRITIN, TIBC, IRON, RETICCTPCT in the last 72 hours. Sepsis Labs: Recent Labs  Lab 02/02/21 2220 02/03/21 0201  LATICACIDVEN 2.5* 1.0    No results found for this or any previous visit (from the past 240 hour(s)).       Radiology Studies: DG Chest 2 View  Result Date: 02/02/2021 CLINICAL DATA:  Suspected sepsis EXAM: CHEST - 2 VIEW COMPARISON:  None. FINDINGS: No consolidation, features of edema, pneumothorax, or effusion. Pulmonary vascularity is normally distributed. The cardiomediastinal contours are unremarkable. No acute osseous or soft tissue abnormality. Degenerative changes are present in the imaged spine and shoulders. IMPRESSION: No acute cardiopulmonary abnormality. Electronically Signed   By: Kreg Shropshire M.D.   On: 02/02/2021 22:38   DG Foot 2 Views Left  Result Date: 02/02/2021 CLINICAL DATA:  Infected wound great toe EXAM: LEFT FOOT - 2 VIEW COMPARISON:  None. FINDINGS: Extensive soft tissue swelling and ulceration of the first digit is seen medial to the interphalangeal joint with subjacent areas of age-indeterminate ulceration on a background of more diffuse spurring. There is a questionable lucency through a osteophyte along the medial base of the first distal phalanx as well which could reflect an acute or cut  subacute fracture. Few punctate indeterminate radiodensities are present in the soft tissues, possibly related to erosive changes. Additional degenerative changes including subcortical cystic features about the first interphalangeal joint as well. More diffuse degenerative changes throughout the foot elsewhere are fairly mild-to-moderate. No other acute or conspicuous osseous abnormalities. Bidirectional calcaneal spur. Vascular calcium in the soft tissues. IMPRESSION: Soft tissue swelling and ulceration along the medial first digit at the level of the interphalangeal joint. There is erosive change about the level of the first interphalangeal joint conspicuous for subjacent osteomyelitis. Questionable lucency through a large spur at the base of the first distal phalanx, could reflect a small fracture line as well. More diffuse mild-to-moderate degenerative changes throughout the foot. Bidirectional calcaneal spurs. Electronically Signed   By: Kreg Shropshire M.D.   On: 02/02/2021 22:41        Scheduled Meds: . enoxaparin (LOVENOX) injection  40 mg Subcutaneous Daily  . gabapentin  300 mg Oral QHS  . hydrochlorothiazide  12.5 mg Oral Daily  . insulin aspart  0-15 Units Subcutaneous TID AC & HS  . lisinopril  20 mg  Oral Daily   Continuous Infusions: . sodium chloride 125 mL/hr at 02/03/21 0628  . cefTRIAXone (ROCEPHIN)  IV    . metronidazole    . vancomycin    . vancomycin 1,750 mg (02/03/21 9449)          Glade Lloyd, MD [facility] 02/03/2021, 7:53 AM

## 2021-02-03 NOTE — Plan of Care (Signed)

## 2021-02-03 NOTE — ED Notes (Signed)
Attempted report x1. 

## 2021-02-03 NOTE — Consult Note (Signed)
Reason for Consult:Left great toe osteo Referring Physician: Geannie RisenK Alekh Time called: [phone] Time at bedside: 0942   Brett White is an 60 y.o. male.  HPI: Brett White was in his usual state of health until about 10d ago. At that time he developed what he thought was a blister on his left great toe. About 4d ago he took his boot off and the toe was painful and having a foul discharge. When it did not improve on its own he came to the ED for evaluation and was admitted. MRI revealed likely osteo and orthopedic surgery was consulted. He works as a Patent attorneyresidential plumber.  Past Medical History:  Diagnosis Date  . Diabetes mellitus without complication [facility](HCC)     Past Surgical History:  Procedure Laterality Date  . APPENDECTOMY    . BACK SURGERY    . TONSILLECTOMY      Family History  Problem Relation Age of Onset  . Heart attack Neg Hx     Social History:  reports that he has never smoked. He has never used smokeless tobacco. He reports previous alcohol use. He reports previous drug use.  Allergies: No Known Allergies  Medications: I have reviewed the patient's current medications.  Results for orders placed or performed during the hospital encounter of 02/02/21 (from the past 48 hour(s))  Culture, blood (Routine x 2)     Status: None (Preliminary result)   Collection Time: 02/02/21 10:17 PM   Specimen: BLOOD RIGHT HAND  Result Value Ref Range   Specimen Description BLOOD RIGHT HAND    Special Requests AEROBIC BOTTLE ONLY Blood Culture adequate volume    Culture      NO GROWTH < 12 HOURS Performed at [facility], [address] [place], [place] [address]    Report Status PENDING   Comprehensive metabolic panel     Status: Abnormal   Collection Time: 02/02/21 10:20 PM  Result Value Ref Range   Sodium 135 135 - 145 mmol/L   Potassium 3.7 3.5 - 5.1 mmol/L   Chloride 100 98 - 111 mmol/L   CO2 25 22 - 32 mmol/L   Glucose, Bld 311 (H) 70 - 99 mg/dL    Comment: Glucose reference  range applies only to samples taken after fasting for at least 8 hours.   BUN 20 6 - 20 mg/dL   Creatinine, Ser 4.781.38 (H) 0.61 - 1.24 mg/dL   Calcium 8.9 8.9 - 29.510.3 mg/dL   Total Protein 7.7 6.5 - 8.1 g/dL   Albumin 3.7 3.5 - 5.0 g/dL   AST 16 15 - 41 U/L   ALT 13 0 - 44 U/L   Alkaline Phosphatase 82 38 - 126 U/L   Total Bilirubin 0.6 0.3 - 1.2 mg/dL   GFR, Estimated 60 (L) >60 mL/min    Comment: (NOTE) Calculated using the CKD-EPI Creatinine Equation (2021)    Anion gap 10 5 - 15    Comment: Performed at [facility], [address] [place], [place] [address]  Lactic acid, plasma     Status: Abnormal   Collection Time: 02/02/21 10:20 PM  Result Value Ref Range   Lactic Acid, Venous 2.5 (HH) 0.5 - 1.9 mmol/L    Comment: CRITICAL RESULT CALLED TO, READ BACK BY AND VERIFIED WITH: Desmond Dike. RUFFY, RN. 2314 02/02/21 A. MCDOWELL Performed at [facility], [address] [place], [place] [address]   CBC with Differential     Status: None   Collection Time:  02/02/21 10:20 PM  Result Value Ref Range   WBC 8.2 4.0 - 10.5 K/uL   RBC 4.65 4.22 - 5.81 MIL/uL   Hemoglobin 13.4 13.0 - 17.0 g/dL   HCT 24.4 01.0 - 27.2 %   MCV 88.8 80.0 - 100.0 fL   MCH 28.8 26.0 - 34.0 pg   MCHC 32.4 30.0 - 36.0 g/dL   RDW 53.6 64.4 - 03.4 %   Platelets 335 150 - 400 K/uL   nRBC 0.0 0.0 - 0.2 %   Neutrophils Relative % 75 %   Neutro Abs 6.2 1.7 - 7.7 K/uL   Lymphocytes Relative 17 %   Lymphs Abs 1.4 0.7 - 4.0 K/uL   Monocytes Relative 6 %   Monocytes Absolute 0.5 0.1 - 1.0 K/uL   Eosinophils Relative 1 %   Eosinophils Absolute 0.1 0.0 - 0.5 K/uL   Basophils Relative 1 %   Basophils Absolute 0.0 0.0 - 0.1 K/uL   Immature Granulocytes 0 %   Abs Immature Granulocytes 0.03 0.00 - 0.07 K/uL    Comment: Performed at [facility], [address] [place], [place] [address]  Protime-INR     Status: None   Collection Time: 02/02/21 10:20 PM  Result Value Ref Range   Prothrombin Time 13.6 11.4 -  15.2 seconds   INR 1.0 0.8 - 1.2    Comment: (NOTE) INR goal varies based on device and disease states. Performed at [facility], [address], [place], [place] [address]   Culture, blood (Routine x 2)     Status: None (Preliminary result)   Collection Time: 02/02/21 10:20 PM   Specimen: BLOOD LEFT HAND  Result Value Ref Range   Specimen Description BLOOD LEFT HAND    Special Requests      BOTTLES DRAWN AEROBIC AND ANAEROBIC Blood Culture adequate volume   Culture      NO GROWTH < 12 HOURS Performed at [facility], [address] [place], [place] [address]    Report Status PENDING   Urinalysis, Routine w reflex microscopic     Status: Abnormal   Collection Time: 02/02/21 10:22 PM  Result Value Ref Range   Color, Urine YELLOW YELLOW   APPearance HAZY (A) CLEAR   Specific Gravity, Urine 1.024 1.005 - 1.030   pH 5.0 5.0 - 8.0   Glucose, UA >=500 (A) NEGATIVE mg/dL   Hgb urine dipstick MODERATE (A) NEGATIVE   Bilirubin Urine NEGATIVE NEGATIVE   Ketones, ur NEGATIVE NEGATIVE mg/dL   Protein, ur 30 (A) NEGATIVE mg/dL   Nitrite NEGATIVE NEGATIVE   Leukocytes,Ua NEGATIVE NEGATIVE   RBC / HPF 11-20 0 - 5 RBC/hpf   WBC, UA 0-5 0 - 5 WBC/hpf   Bacteria, UA NONE SEEN NONE SEEN   Squamous Epithelial / LPF 0-5 0 - 5   Mucus PRESENT    Hyaline Casts, UA PRESENT     Comment: Performed at [facility], [address], [place], [place] [address]  Lactic acid, plasma     Status: None   Collection Time: 02/03/21  2:01 AM  Result Value Ref Range   Lactic Acid, Venous 1.0 0.5 - 1.9 mmol/L    Comment: Performed at [facility], [address] [place], [place] [address]  HIV Antibody (routine testing w rflx)     Status: None   Collection Time: 02/03/21  2:17 AM  Result Value Ref Range   HIV Screen 4th Generation  wRfx Non Reactive Non Reactive    Comment: Performed at [facility], [address] [place], [place] [address]  Hemoglobin A1c     Status:  Abnormal   Collection Time: 02/03/21  2:17 AM  Result Value Ref Range   Hgb A1c MFr Bld 11.5 (H) 4.8 - 5.6 %    Comment: (NOTE) Pre diabetes:          5.7%-6.4%  Diabetes:              >6.4%  Glycemic control for   <7.0% adults with diabetes    Mean Plasma Glucose 283.35 mg/dL    Comment: Performed at [facility], [address] [place], [place] [address]  Comprehensive metabolic panel     Status: Abnormal   Collection Time: 02/03/21  2:17 AM  Result Value Ref Range   Sodium 134 (L) 135 - 145 mmol/L   Potassium 3.8 3.5 - 5.1 mmol/L   Chloride 103 98 - 111 mmol/L   CO2 24 22 - 32 mmol/L   Glucose, Bld 259 (H) 70 - 99 mg/dL    Comment: Glucose reference range applies only to samples taken after fasting for at least 8 hours.   BUN 21 (H) 6 - 20 mg/dL   Creatinine, Ser 5.17 (H) 0.61 - 1.24 mg/dL   Calcium 8.8 (L) 8.9 - 10.3 mg/dL   Total Protein 7.2 6.5 - 8.1 g/dL   Albumin 3.5 3.5 - 5.0 g/dL   AST 12 (L) 15 - 41 U/L   ALT 9 0 - 44 U/L   Alkaline Phosphatase 75 38 - 126 U/L   Total Bilirubin 0.7 0.3 - 1.2 mg/dL   GFR, Estimated >00 >17 mL/min    Comment: (NOTE) Calculated using the CKD-EPI Creatinine Equation (2021)    Anion gap 7 5 - 15    Comment: Performed at [facility], [address], [place], [place] [address]  Magnesium     Status: None   Collection Time: 02/03/21  2:17 AM  Result Value Ref Range   Magnesium 2.1 1.7 - 2.4 mg/dL    Comment: Performed at [facility], [address] [place], [place] [address]  CBC WITH DIFFERENTIAL     Status: Abnormal   Collection Time: 02/03/21  2:17 AM  Result Value Ref Range   WBC 7.7 4.0 - 10.5 K/uL   RBC 4.48 4.22 - 5.81 MIL/uL   Hemoglobin 12.9 (L) 13.0 - 17.0 g/dL   HCT 63.8 46.6 - 59.9 %   MCV 89.1 80.0 - 100.0 fL   MCH 28.8 26.0 - 34.0 pg   MCHC 32.3 30.0 - 36.0 g/dL   RDW 35.7 01.7 - 79.3 %   Platelets 312 150 - 400 K/uL   nRBC 0.0 0.0 - 0.2 %   Neutrophils Relative % 64 %   Neutro Abs 4.9  1.7 - 7.7 K/uL   Lymphocytes Relative 26 %   Lymphs Abs 2.0 0.7 - 4.0 K/uL   Monocytes Relative 7 %   Monocytes Absolute 0.5 0.1 - 1.0 K/uL   Eosinophils Relative 1 %   Eosinophils Absolute 0.1 0.0 - 0.5 K/uL   Basophils Relative 1 %   Basophils Absolute 0.0 0.0 - 0.1 K/uL   Immature Granulocytes 1 %   Abs Immature Granulocytes 0.04 0.00 - 0.07 K/uL    Comment: Performed at [facility], [address], [place], [place] [address]  Glucose, capillary  Status: Abnormal   Collection Time: 02/03/21  6:35 AM  Result Value Ref Range   Glucose-Capillary 253 (H) 70 - 99 mg/dL    Comment: Glucose reference range applies only to samples taken after fasting for at least 8 hours.   Comment 1 Notify RN     DG Chest 2 View  Result Date: 02/02/2021 CLINICAL DATA:  Suspected sepsis EXAM: CHEST - 2 VIEW COMPARISON:  None. FINDINGS: No consolidation, features of edema, pneumothorax, or effusion. Pulmonary vascularity is normally distributed. The cardiomediastinal contours are unremarkable. No acute osseous or soft tissue abnormality. Degenerative changes are present in the imaged spine and shoulders. IMPRESSION: No acute cardiopulmonary abnormality. Electronically Signed   By: Kreg Shropshire M.D.   On: 02/02/2021 22:38   MR FOOT LEFT WO CONTRAST  Result Date: 02/03/2021 CLINICAL DATA:  Osteomyelitis.  Hypertension and diabetes. EXAM: MRI OF THE LEFT FOOT WITHOUT CONTRAST TECHNIQUE: Multiplanar, multisequence MR imaging of the left forefoot was performed. No intravenous contrast was administered. COMPARISON:  Radiographs 02/02/2021 FINDINGS: Bones/Joint/Cartilage Erosions of the medial head of the proximal phalanx of the great toe and of the medial base of the distal phalanx of the great toe are present along with marrow edema in the distal phalanx and distally in the proximal phalanx. Although gout arthropathy could cause a similar imaging appearance, the appearance is concerning for osteomyelitis given  the clinical scenario. This also on appearance of suspected marrow edema in the distal phalanges of the third and fifth toes such that osteomyelitis cannot be excluded in these locations either. Notable arthropathy at the Lisfranc joint especially along the first digit dorsally, with subcortical marrow edema, degenerative subcortical cysts, and notable spurring. No malalignment at the Lisfranc joint. Ligaments The Lisfranc ligament appears intact. Muscles and Tendons Diffuse edema within regional musculature, probably neurogenic given the diffuse appearance. Soft tissues Dorsal subcutaneous edema in the foot tracking into the toes, nonspecific but cellulitis is not excluded. Edema in the toes is most confluent in the great toe. IMPRESSION: 1. Substantial erosive arthropathy at the interphalangeal joint of the great toe medially, with associated marrow edema in the distal phalanx and distally in the proximal phalanx. Given the swelling in the toe and clinical context, the appearance is suspicious for osteomyelitis, with gout arthropathy being a less likely differential diagnostic consideration. 2. Marrow edema in the distal phalanx small toe could also be from osteomyelitis. Marrow edema in the distal phalanx of the third toe is not as convincing but is still abnormal. 3. Dorsal subcutaneous edema in the foot may be from cellulitis. 4. Diffuse muscular edema probably neurogenic. 5. Arthropathy at the Lisfranc joint especially medially. No Lisfranc joint malalignment, the Lisfranc ligament appears intact. Electronically Signed   By: Gaylyn Rong M.D.   On: 02/03/2021 08:19   DG Foot 2 Views Left  Result Date: 02/02/2021 CLINICAL DATA:  Infected wound great toe EXAM: LEFT FOOT - 2 VIEW COMPARISON:  None. FINDINGS: Extensive soft tissue swelling and ulceration of the first digit is seen medial to the interphalangeal joint with subjacent areas of age-indeterminate ulceration on a background of more diffuse  spurring. There is a questionable lucency through a osteophyte along the medial base of the first distal phalanx as well which could reflect an acute or cut subacute fracture. Few punctate indeterminate radiodensities are present in the soft tissues, possibly related to erosive changes. Additional degenerative changes including subcortical cystic features about the first interphalangeal joint as well. More diffuse degenerative changes throughout the  foot elsewhere are fairly mild-to-moderate. No other acute or conspicuous osseous abnormalities. Bidirectional calcaneal spur. Vascular calcium in the soft tissues. IMPRESSION: Soft tissue swelling and ulceration along the medial first digit at the level of the interphalangeal joint. There is erosive change about the level of the first interphalangeal joint conspicuous for subjacent osteomyelitis. Questionable lucency through a large spur at the base of the first distal phalanx, could reflect a small fracture line as well. More diffuse mild-to-moderate degenerative changes throughout the foot. Bidirectional calcaneal spurs. Electronically Signed   By: Kreg Shropshire M.D.   On: 02/02/2021 22:41    Review of Systems  HENT: Negative for ear discharge, ear pain, hearing loss and tinnitus.   Eyes: Negative for photophobia and pain.  Respiratory: Negative for cough and shortness of breath.   Cardiovascular: Negative for chest pain.  Gastrointestinal: Negative for abdominal pain, nausea and vomiting.  Genitourinary: Negative for dysuria, flank pain, frequency and urgency.  Musculoskeletal: Positive for arthralgias (Left great toe). Negative for back pain, myalgias and neck pain.  Neurological: Negative for dizziness and headaches.  Hematological: Does not bruise/bleed easily.  Psychiatric/Behavioral: The patient is not nervous/anxious.    Blood pressure (!) 178/102, pulse 79, temperature 97.7 F (36.5 C), resp. rate 16, height 6\' 3"  (1.905 m), weight 97.5 kg, SpO2  100 %. Physical Exam Constitutional:      General: He is not in acute distress.    Appearance: He is well-developed. He is not diaphoretic.  HENT:     Head: Normocephalic and atraumatic.  Eyes:     General: No scleral icterus.       Right eye: No discharge.        Left eye: No discharge.     Conjunctiva/sclera: Conjunctivae normal.  Cardiovascular:     Rate and Rhythm: Normal rate and regular rhythm.  Pulmonary:     Effort: Pulmonary effort is normal. No respiratory distress.  Musculoskeletal:     Cervical back: Normal range of motion.  Feet:     Comments: Left foot: Ulceration plantar hallux with malodorous discharge, fusiform edema and erythema entire digit. SPN/DPN/TN intact, EHL 5/5, 2+ DP, 1+ PT Skin:    General: Skin is warm and dry.  Neurological:     Mental Status: He is alert.  Psychiatric:        Behavior: Behavior normal.     Assessment/Plan: Left great toe osteo -- Dr. to evaluate, likely needs amputation. Please keep NPO. Multiple medical problems including DM, HTN, and hx/o CVA -- per primary service    Lajoyce Corners, PA-C Orthopedic Surgery [phone] 02/03/2021, 10:03 AM

## 2021-02-03 NOTE — ED Notes (Signed)
Patient transported to MRI 

## 2021-02-03 NOTE — ED Provider Notes (Signed)
[facility] EMERGENCY DEPARTMENT Provider Note   CSN: [id number] Arrival date & time: 02/02/21  2157     History Chief Complaint  Patient presents with  . Foot Pain  . Wound Infection    Brett White is a 60 y.o. male.  Patient with past medical history notable for diabetes and peripheral neuropathy presents to the emergency department with a chief complaint of toe infection.  He states that he stepped on a tack about 1 week ago.  States that he developed a blister and infection to his left great toe.  Reports associated redness extending up the foot.  Denies any fevers.  States that the pain worsened today, so he came to the ER.  The history is provided by the patient. No language interpreter was used.       Past Medical History:  Diagnosis Date  . Diabetes mellitus without complication (HCC)     There are no problems to display for this patient.   Past Surgical History:  Procedure Laterality Date  . APPENDECTOMY    . BACK SURGERY    . TONSILLECTOMY         History reviewed. No pertinent family history.  Social History   Tobacco Use  . Smoking status: Never Smoker  . Smokeless tobacco: Never Used  Substance Use Topics  . Alcohol use: Not Currently  . Drug use: Not Currently    Home Medications Prior to Admission medications   Medication Sig Start Date End Date Taking? Authorizing Provider  diphenhydramine-acetaminophen (TYLENOL PM) 25-500 MG TABS tablet Take 2 tablets by mouth at bedtime as needed.    [provider]  doxycycline (VIBRAMYCIN) 100 MG capsule Take 1 capsule (100 mg total) by mouth 2 (two) times daily. One po bid x 7 days 11/05/20   Dartha Lodge, PA-C  hydrochlorothiazide (HYDRODIURIL) 12.5 MG tablet Take 1 tablet (12.5 mg total) by mouth daily. 11/26/20   Roxy Horseman, PA-C  HYDROcodone-acetaminophen (NORCO) 5-325 MG tablet Take 1-2 tablets by mouth every 6 (six) hours as needed. 11/05/20   Dartha Lodge, PA-C   ibuprofen (ADVIL) 600 MG tablet Take 1 tablet (600 mg total) by mouth every 6 (six) hours as needed. 11/05/20   Dartha Lodge, PA-C  metFORMIN (GLUCOPHAGE) 500 MG tablet Take 1,000 mg by mouth 2 (two) times daily with a meal.    [provider]  oxyCODONE-acetaminophen (PERCOCET/ROXICET) 5-325 MG tablet Take 1-2 tablets by mouth every 6 (six) hours as needed for severe pain. 11/08/20   Robinson, [place] N, PA-C    Allergies    Patient has no known allergies.  Review of Systems   Review of Systems  All other systems reviewed and are negative.   Physical Exam Updated Vital Signs BP 122/85 (BP Location: Right Arm)   Pulse 81   Temp 98.3 F (36.8 C) (Oral)   Resp 18   Ht 6\' 3"  (1.905 m)   Wt 97.5 kg   SpO2 98%   BMI 26.87 kg/m   Physical Exam Vitals and nursing note reviewed.  Constitutional:      Appearance: He is well-developed.  HENT:     Head: Normocephalic and atraumatic.  Eyes:     Conjunctiva/sclera: Conjunctivae normal.  Cardiovascular:     Rate and Rhythm: Normal rate and regular rhythm.     Heart sounds: No murmur heard.   Pulmonary:     Effort: Pulmonary effort is normal. No respiratory distress.     Breath  sounds: Normal breath sounds.  Abdominal:     Palpations: Abdomen is soft.     Tenderness: There is no abdominal tenderness.  Musculoskeletal:     Cervical back: Neck supple.  Skin:    General: Skin is warm and dry.     Comments: Wound to left great toe as pictured Cellulitis to the midfoot as pictured  Neurological:     Mental Status: He is alert and oriented to person, place, and time.  Psychiatric:        Mood and Affect: Mood normal.        Behavior: Behavior normal.         ED Results / Procedures / Treatments   Labs (all labs ordered are listed, but only abnormal results are displayed) Labs Reviewed  COMPREHENSIVE METABOLIC PANEL - Abnormal; Notable for the following components:      Result Value   Glucose, Bld 311 (*)     Creatinine, Ser 1.38 (*)    GFR, Estimated 60 (*)    All other components within normal limits  LACTIC ACID, PLASMA - Abnormal; Notable for the following components:   Lactic Acid, Venous 2.5 (*)    All other components within normal limits  URINALYSIS, ROUTINE W REFLEX MICROSCOPIC - Abnormal; Notable for the following components:   APPearance HAZY (*)    Glucose, UA >=500 (*)    Hgb urine dipstick MODERATE (*)    Protein, ur 30 (*)    All other components within normal limits  CULTURE, BLOOD (ROUTINE X 2)  CULTURE, BLOOD (ROUTINE X 2)  RESP PANEL BY RT-PCR (FLU A&B, COVID) ARPGX2  CBC WITH DIFFERENTIAL/PLATELET  PROTIME-INR  LACTIC ACID, PLASMA    EKG None  Radiology DG Chest 2 View  Result Date: 02/02/2021 CLINICAL DATA:  Suspected sepsis EXAM: CHEST - 2 VIEW COMPARISON:  None. FINDINGS: No consolidation, features of edema, pneumothorax, or effusion. Pulmonary vascularity is normally distributed. The cardiomediastinal contours are unremarkable. No acute osseous or soft tissue abnormality. Degenerative changes are present in the imaged spine and shoulders. IMPRESSION: No acute cardiopulmonary abnormality. Electronically Signed   By: Kreg Shropshire M.D.   On: 02/02/2021 22:38   DG Foot 2 Views Left  Result Date: 02/02/2021 CLINICAL DATA:  Infected wound great toe EXAM: LEFT FOOT - 2 VIEW COMPARISON:  None. FINDINGS: Extensive soft tissue swelling and ulceration of the first digit is seen medial to the interphalangeal joint with subjacent areas of age-indeterminate ulceration on a background of more diffuse spurring. There is a questionable lucency through a osteophyte along the medial base of the first distal phalanx as well which could reflect an acute or cut subacute fracture. Few punctate indeterminate radiodensities are present in the soft tissues, possibly related to erosive changes. Additional degenerative changes including subcortical cystic features about the first interphalangeal  joint as well. More diffuse degenerative changes throughout the foot elsewhere are fairly mild-to-moderate. No other acute or conspicuous osseous abnormalities. Bidirectional calcaneal spur. Vascular calcium in the soft tissues. IMPRESSION: Soft tissue swelling and ulceration along the medial first digit at the level of the interphalangeal joint. There is erosive change about the level of the first interphalangeal joint conspicuous for subjacent osteomyelitis. Questionable lucency through a large spur at the base of the first distal phalanx, could reflect a small fracture line as well. More diffuse mild-to-moderate degenerative changes throughout the foot. Bidirectional calcaneal spurs. Electronically Signed   By: Kreg Shropshire M.D.   On: 02/02/2021 22:41  Procedures Procedures   Medications Ordered in ED Medications  piperacillin-tazobactam (ZOSYN) IVPB 3.375 g (has no administration in time range)  vancomycin (VANCOREADY) IVPB 1750 mg/350 mL (has no administration in time range)  fentaNYL (SUBLIMAZE) injection 50 mcg (has no administration in time range)  vancomycin (VANCOREADY) IVPB 1000 mg/200 mL (has no administration in time range)  acetaminophen (TYLENOL) tablet 650 mg (650 mg Oral Given 02/02/21 2205)    ED Course  I have reviewed the triage vital signs and the nursing notes.  Pertinent labs & imaging results that were available during my care of the patient were reviewed by me and considered in my medical decision making (see chart for details).    MDM Rules/Calculators/A&P                          This patient complains of left great toe pain/wound, this involves an extensive number of treatment options, and is a complaint that carries with it a high risk of complications and morbidity.    Differential Dx Cellulitis, osteo, abscess  Pertinent Labs I ordered, reviewed, and interpreted labs, which included lactate mildly elevated at 2.5.  Glucose 311, Cr 1.38, will give  fluids. No leukocytosis   Imaging Interpretation I ordered imaging studies which included left foot.  I independently visualized and interpreted the left foot, which showed erosive changes to the great toe.   Medications I ordered medication IV abx for osteo of the great toe.  Reassessments After the interventions stated above, I reevaluated the patient and found still in pain.  He just got into a room and will shortly receive his pain meds that I ordered in triage.  Consultants Dr. Leafy Half, who is appreciated for admitting.  Plan Admit    Final Clinical Impression(s) / ED Diagnoses Final diagnoses:  Osteomyelitis of left foot, unspecified type (HCC)  Cellulitis of toe of left foot    Rx / DC Orders ED Discharge Orders    None       Roxy Horseman, PA-C 02/03/21 0141    Mesner, Barbara Cower, MD 02/03/21 [phone]

## 2021-02-03 NOTE — Progress Notes (Addendum)
Inpatient Diabetes Program Recommendations  AACE/ADA: New Consensus Statement on Inpatient Glycemic Control (2015)  Target Ranges:  Prepandial:   less than 140 mg/dL      Peak postprandial:   less than 180 mg/dL (1-2 hours)      Critically ill patients:  140 - 180 mg/dL   Lab Results  Component Value Date   GLUCAP 253 (H) 02/03/2021   HGBA1C 11.5 (H) 02/03/2021    Review of Glycemic Control  Diabetes history: DM 2 Outpatient Diabetes medications: Metformin 1000 mg bid Current orders for Inpatient glycemic control:  Novolog 0-15 units tid & hs  A1c 11.5% on 5/6  Inpatient Diabetes Program Recommendations:    - start Lantus 10 units  Will see pt today.  Addendum 10:30 am:  Spoke with pt at bedside regarding A1c and glucose control at home. Pt reports he has not had his meds in 2-3 weeks due to lack of transportation. Pt reports having a PCP visit 3 weeks ago, had a high A1c, was ordered long acting insulin ($100 copay) and a metformin refill but was unable to pick it up and afford that insulin.  Pt reports being shown how to do the insulin pen. Discussed cheaper insulin options from [employer] discussed a plan with pt to coordinate transportation up to 1 week prior to when his prescriptions run out. Explained how glucose control is important for wound healing.  Thanks,  Christena Deem RN, MSN, BC-ADM Inpatient Diabetes Coordinator Team Pager [phone] (8a-5p)

## 2021-02-03 NOTE — ED Notes (Signed)
Pa  at bedside. 

## 2021-02-03 NOTE — H&P (View-Only) (Signed)
ORTHOPAEDIC CONSULTATION  REQUESTING PHYSICIAN: Glade Lloyd, MD  Chief Complaint: Ulceration and cellulitis swelling and pain left great toe.  HPI: Brett White is a 60 y.o. male who presents with chronic ulcer involving the entire plantar aspect of the left great toe patient presents with acute redness swelling pain and drainage.  Past Medical History:  Diagnosis Date  . Diabetes mellitus without complication [facility])    Past Surgical History:  Procedure Laterality Date  . APPENDECTOMY    . BACK SURGERY    . TONSILLECTOMY     Social History   Socioeconomic History  . Marital status: Single    Spouse name: Not on file  . Number of children: Not on file  . Years of education: Not on file  . Highest education level: Not on file  Occupational History  . Not on file  Tobacco Use  . Smoking status: Never Smoker  . Smokeless tobacco: Never Used  Substance and Sexual Activity  . Alcohol use: Not Currently  . Drug use: Not Currently  . Sexual activity: Not on file  Other Topics Concern  . Not on file  Social History Narrative  . Not on file   Social Determinants of Health   Financial Resource Strain: Not on file  Food Insecurity: Not on file  Transportation Needs: Not on file  Physical Activity: Not on file  Stress: Not on file  Social Connections: Not on file   Family History  Problem Relation Age of Onset  . Heart attack Neg Hx    - negative except otherwise stated in the family history section No Known Allergies Prior to Admission medications   Medication Sig Start Date End Date Taking? Authorizing Provider  diphenhydramine-acetaminophen (TYLENOL PM) 25-500 MG TABS tablet Take 2 tablets by mouth at bedtime as needed.   Yes [provider]  hydrochlorothiazide (HYDRODIURIL) 12.5 MG tablet Take 1 tablet (12.5 mg total) by mouth daily. 11/26/20  Yes Roxy Horseman, PA-C  ibuprofen (ADVIL) 600 MG tablet Take 1 tablet (600 mg total) by mouth every 6  (six) hours as needed. Patient not taking: Reported on 02/03/2021 11/05/20   Legrand Rams   DG Chest 2 View  Result Date: 02/02/2021 CLINICAL DATA:  Suspected sepsis EXAM: CHEST - 2 VIEW COMPARISON:  None. FINDINGS: No consolidation, features of edema, pneumothorax, or effusion. Pulmonary vascularity is normally distributed. The cardiomediastinal contours are unremarkable. No acute osseous or soft tissue abnormality. Degenerative changes are present in the imaged spine and shoulders. IMPRESSION: No acute cardiopulmonary abnormality. Electronically Signed   By: Kreg Shropshire M.D.   On: 02/02/2021 22:38   MR FOOT LEFT WO CONTRAST  Result Date: 02/03/2021 CLINICAL DATA:  Osteomyelitis.  Hypertension and diabetes. EXAM: MRI OF THE LEFT FOOT WITHOUT CONTRAST TECHNIQUE: Multiplanar, multisequence MR imaging of the left forefoot was performed. No intravenous contrast was administered. COMPARISON:  Radiographs 02/02/2021 FINDINGS: Bones/Joint/Cartilage Erosions of the medial head of the proximal phalanx of the great toe and of the medial base of the distal phalanx of the great toe are present along with marrow edema in the distal phalanx and distally in the proximal phalanx. Although gout arthropathy could cause a similar imaging appearance, the appearance is concerning for osteomyelitis given the clinical scenario. This also on appearance of suspected marrow edema in the distal phalanges of the third and fifth toes such that osteomyelitis cannot be excluded in these locations either. Notable arthropathy at the Lisfranc joint especially along the first digit  dorsally, with subcortical marrow edema, degenerative subcortical cysts, and notable spurring. No malalignment at the Lisfranc joint. Ligaments The Lisfranc ligament appears intact. Muscles and Tendons Diffuse edema within regional musculature, probably neurogenic given the diffuse appearance. Soft tissues Dorsal subcutaneous edema in the foot tracking into  the toes, nonspecific but cellulitis is not excluded. Edema in the toes is most confluent in the great toe. IMPRESSION: 1. Substantial erosive arthropathy at the interphalangeal joint of the great toe medially, with associated marrow edema in the distal phalanx and distally in the proximal phalanx. Given the swelling in the toe and clinical context, the appearance is suspicious for osteomyelitis, with gout arthropathy being a less likely differential diagnostic consideration. 2. Marrow edema in the distal phalanx small toe could also be from osteomyelitis. Marrow edema in the distal phalanx of the third toe is not as convincing but is still abnormal. 3. Dorsal subcutaneous edema in the foot may be from cellulitis. 4. Diffuse muscular edema probably neurogenic. 5. Arthropathy at the Lisfranc joint especially medially. No Lisfranc joint malalignment, the Lisfranc ligament appears intact. Electronically Signed   By: Walter  Liebkemann M.D.   On: 02/03/2021 08:19   DG Foot 2 Views Left  Result Date: 02/02/2021 CLINICAL DATA:  Infected wound great toe EXAM: LEFT FOOT - 2 VIEW COMPARISON:  None. FINDINGS: Extensive soft tissue swelling and ulceration of the first digit is seen medial to the interphalangeal joint with subjacent areas of age-indeterminate ulceration on a background of more diffuse spurring. There is a questionable lucency through a osteophyte along the medial base of the first distal phalanx as well which could reflect an acute or cut subacute fracture. Few punctate indeterminate radiodensities are present in the soft tissues, possibly related to erosive changes. Additional degenerative changes including subcortical cystic features about the first interphalangeal joint as well. More diffuse degenerative changes throughout the foot elsewhere are fairly mild-to-moderate. No other acute or conspicuous osseous abnormalities. Bidirectional calcaneal spur. Vascular calcium in the soft tissues. IMPRESSION: Soft  tissue swelling and ulceration along the medial first digit at the level of the interphalangeal joint. There is erosive change about the level of the first interphalangeal joint conspicuous for subjacent osteomyelitis. Questionable lucency through a large spur at the base of the first distal phalanx, could reflect a small fracture line as well. More diffuse mild-to-moderate degenerative changes throughout the foot. Bidirectional calcaneal spurs. Electronically Signed   By: Price  DeHay M.D.   On: 02/02/2021 22:41   - pertinent xrays, CT, MRI studies were reviewed and independently interpreted  Positive ROS: All other systems have been reviewed and were otherwise negative with the exception of those mentioned in the HPI and as above.  Physical Exam: General: Alert, no acute distress Psychiatric: Patient is competent for consent with normal mood and affect Lymphatic: No axillary or cervical lymphadenopathy Cardiovascular: No pedal edema Respiratory: No cyanosis, no use of accessory musculature GI: No organomegaly, abdomen is soft and non-tender    Images:  @ENCIMAGES@  Labs:  Lab Results  Component Value Date   HGBA1C 11.5 (H) 02/03/2021   REPTSTATUS PENDING 02/02/2021   CULT  02/02/2021    NO GROWTH < 12 HOURS Performed at [facility], [address] [place], [place] [address]     Lab Results  Component Value Date   ALBUMIN 3.5 02/03/2021   ALBUMIN 3.7 02/02/2021   ALBUMIN 3.7 11/25/2020     CBC EXTENDED Latest Ref Rng & Units 02/03/2021 02/02/2021 11/25/2020  WBC   4.0 - 10.5 K/uL 7.7 8.2 6.6  RBC 4.22 - 5.81 MIL/uL 4.48 4.65 5.34  HGB 13.0 - 17.0 g/dL 12.9(L) 13.4 15.0  HCT 39.0 - 52.0 % 39.9 41.3 46.5  PLT 150 - 400 K/uL 312 335 276  NEUTROABS 1.7 - 7.7 K/uL 4.9 6.2 -  LYMPHSABS 0.7 - 4.0 K/uL 2.0 1.4 -    Neurologic: Patient does not have protective sensation bilateral lower extremities.   MUSCULOSKELETAL:   Skin: Patient has a strong dorsalis pedis and  posterior tibial pulse.  He has sausage digit swelling of the left great toe with cellulitis.  There is a necrotic ulcer on the plantar aspect left great toe that encompasses the entire plantar aspect of this toe.  This extends down to bone.  Review of the radiographs shows some cystic changes in the bone.  Review of the MRI scan shows increased edema in the tuft of the great toe consistent with osteomyelitis.  Hemoglobin A1c 11.5.  Hemoglobin 12.9 with an albumin of 3.5.  Patient denies a history of gout.  Patient states he is not a smoker.  Assessment: Assessment: Osteomyelitis ulceration cellulitis and purulent drainage left great toe.  Plan: Will plan for amputation left great toe through the MTP joint.  Risk and benefits were discussed including risk of the wound not healing need for nonweightbearing for 2 weeks.  Patient states he understands wished to proceed at this time  Thank you for the consult and the opportunity to see Mr. Mandella  Aldean Baker, MD [facility] [phone] 10:46 AM

## 2021-02-03 NOTE — H&P (Signed)
History and Physical    Brett White WPY:[id number] DOB: Oct 07, 1960 DOA: 02/02/2021  PCP: Patient, [name]  Patient coming from: Home   Chief Complaint:  Chief Complaint  Patient presents with  . Foot Pain  . Wound Infection     HPI:    60 year old male with past medical history of hypertension, diabetes mellitus type 2, previous CVA in 2015, diabetic neuropathy and medical noncompliance who presents to [facility] emergency room with complaints of left foot pain.  Patient explains that approximately 1-1/2 weeks ago he excellently stepped on a thumbtack, causing a small puncture injury to the plantar surface of his left great toe.  Patient states that he did not feel any discomfort until approximately 1 week later he noticed that he was developing a very large blister over the area where the injury was.  Within 24 hours of noticing this blister it had burst expressing a large amount of purulent drainage.  Since that time, patient has experienced severe pain in the left foot and great toe of the left foot.  Patient describes this pain as "fire" in quality and severe in intensity.  Pain is worse with weightbearing or movement of the affected extremity.  Patient denies associated fevers but is complaining of associated generalized malaise weakness and poor appetite.  As patient's pain persisted the following days brought increasing redness and swelling to the left foot.  Due to patient's progressively worsening symptoms the patient did eventually present to [facility] emergency department for evaluation.  Upon evaluation in the emergency department patient was found to have concerning ulceration on the plantar surface of the left great toe with surrounding cellulitis.  X-ray was performed and revealed findings concerning for erosions of the first interphalangeal joint concerning for osteomyelitis.  Emergency department provider initiated the patient on  intravenous vancomycin and contacted the hospitalist group for assessment for admission to the hospital.  Review of Systems:   Review of Systems  Constitutional: Positive for malaise/fatigue.  Musculoskeletal:       Left foot pain  Neurological: Positive for weakness.  All other systems reviewed and are negative.   Past Medical History:  Diagnosis Date  . Diabetes mellitus without complication [facility])     Past Surgical History:  Procedure Laterality Date  . APPENDECTOMY    . BACK SURGERY    . TONSILLECTOMY       reports that he has never smoked. He has never used smokeless tobacco. He reports previous alcohol use. He reports previous drug use.  No Known Allergies  Family History  Problem Relation Age of Onset  . Heart attack Neg Hx      Prior to Admission medications   Medication Sig Start Date End Date Taking? Authorizing Provider  diphenhydramine-acetaminophen (TYLENOL PM) 25-500 MG TABS tablet Take 2 tablets by mouth at bedtime as needed.    [provider]  doxycycline (VIBRAMYCIN) 100 MG capsule Take 1 capsule (100 mg total) by mouth 2 (two) times daily. One po bid x 7 days 11/05/20   Dartha [facility], PA-C  hydrochlorothiazide (HYDRODIURIL) 12.5 MG tablet Take 1 tablet (12.5 mg total) by mouth daily. 11/26/20   Roxy Horseman, PA-C  HYDROcodone-acetaminophen (NORCO) 5-325 MG tablet Take 1-2 tablets by mouth every 6 (six) hours as needed. 11/05/20   Dartha [facility], PA-C  ibuprofen (ADVIL) 600 MG tablet Take 1 tablet (600 mg total) by mouth every 6 (six) hours as needed. 11/05/20   Dartha [facility],  PA-C  metFORMIN (GLUCOPHAGE) 500 MG tablet Take 1,000 mg by mouth 2 (two) times daily with a meal.    [provider]  oxyCODONE-acetaminophen (PERCOCET/ROXICET) 5-325 MG tablet Take 1-2 tablets by mouth every 6 (six) hours as needed for severe pain. 11/08/20   Robinson, [place] N, PA-C    Physical Exam: Vitals:   02/02/21 2158 02/02/21 2200 02/03/21 0048  BP: (!)  190/103  122/85  Pulse: 94  81  Resp: 17  18  Temp: 98.3 F (36.8 C)    TempSrc: Oral    SpO2: 100%  98%  Weight:  97.5 kg   Height:  6\' 3"  (1.905 m)     Constitutional: Awake alert and oriented x3, no associated distress.   Skin: Significant ulceration noted on the plantar surface of the first digit of the left foot measuring approximately 3 cm in diameter.  Notable surrounding redness and induration that tracks up the dorsal surface of the affected foot.  Poor skin turgor otherwise. Eyes: Pupils are equally reactive to light.  No evidence of scleral icterus or conjunctival pallor.  ENMT: Dry mucous membranes noted.  Posterior pharynx clear of any exudate or lesions.   Neck: normal, supple, no masses, no thyromegaly.  No evidence of jugular venous distension.   Respiratory: clear to auscultation bilaterally, no wheezing, no crackles. Normal respiratory effort. No accessory muscle use.  Cardiovascular: Regular rate and rhythm, no murmurs / rubs / gallops.  Notable nonpitting edema of the left foot.  2+ pedal pulses. No carotid bruits.  Chest:   Nontender without crepitus or deformity.   Back:   Nontender without crepitus or deformity. Abdomen: Abdomen is soft and nontender.  No evidence of intra-abdominal masses.  Positive bowel sounds noted in all quadrants.   Musculoskeletal: Significant edema and tenderness of the left foot, further description noted in the skin examination.  No joint deformity upper and lower extremities. Good ROM, no contractures. Normal muscle tone.  Neurologic: CN 2-12 grossly intact. Sensation intact.  Patient moving all 4 extremities spontaneously.  Patient is following all commands.  Patient is responsive to verbal stimuli.   Psychiatric: Patient exhibits depressed mood with appropriate affect.  Patient seems to possess insight as to their current situation.         Labs on Admission: I have personally reviewed following labs and imaging studies -    CBC: Recent Labs  Lab 02/02/21 2220  WBC 8.2  NEUTROABS 6.2  HGB 13.4  HCT 41.3  MCV 88.8  PLT 335   Basic Metabolic Panel: Recent Labs  Lab 02/02/21 2220  NA 135  K 3.7  CL 100  CO2 25  GLUCOSE 311*  BUN 20  CREATININE 1.38*  CALCIUM 8.9   GFR: Estimated Creatinine Clearance: 70.6 mL/min (A) (by C-G formula based on SCr of 1.38 mg/dL (H)). Liver Function Tests: Recent Labs  Lab 02/02/21 2220  AST 16  ALT 13  ALKPHOS 82  BILITOT 0.6  PROT 7.7  ALBUMIN 3.7   No results for input(s): LIPASE, AMYLASE in the last 168 hours. No results for input(s): AMMONIA in the last 168 hours. Coagulation Profile: Recent Labs  Lab 02/02/21 2220  INR 1.0   Cardiac Enzymes: No results for input(s): CKTOTAL, CKMB, CKMBINDEX, TROPONINI in the last 168 hours. BNP (last 3 results) No results for input(s): PROBNP in the last 8760 hours. HbA1C: No results for input(s): HGBA1C in the last 72 hours. CBG: No results for input(s): GLUCAP in the last  168 hours. Lipid Profile: No results for input(s): CHOL, HDL, LDLCALC, TRIG, CHOLHDL, LDLDIRECT in the last 72 hours. Thyroid Function Tests: No results for input(s): TSH, T4TOTAL, FREET4, T3FREE, THYROIDAB in the last 72 hours. Anemia Panel: No results for input(s): VITAMINB12, FOLATE, FERRITIN, TIBC, IRON, RETICCTPCT in the last 72 hours. Urine analysis:    Component Value Date/Time   COLORURINE YELLOW 02/02/2021 2222   APPEARANCEUR HAZY (A) 02/02/2021 2222   LABSPEC 1.024 02/02/2021 2222   PHURINE 5.0 02/02/2021 2222   GLUCOSEU >=500 (A) 02/02/2021 2222   HGBUR MODERATE (A) 02/02/2021 2222   BILIRUBINUR NEGATIVE 02/02/2021 2222   KETONESUR NEGATIVE 02/02/2021 2222   PROTEINUR 30 (A) 02/02/2021 2222   NITRITE NEGATIVE 02/02/2021 2222   LEUKOCYTESUR NEGATIVE 02/02/2021 2222    Radiological Exams on Admission - Personally Reviewed: DG Chest 2 View  Result Date: 02/02/2021 CLINICAL DATA:  Suspected sepsis EXAM: CHEST - 2  VIEW COMPARISON:  None. FINDINGS: No consolidation, features of edema, pneumothorax, or effusion. Pulmonary vascularity is normally distributed. The cardiomediastinal contours are unremarkable. No acute osseous or soft tissue abnormality. Degenerative changes are present in the imaged spine and shoulders. IMPRESSION: No acute cardiopulmonary abnormality. Electronically Signed   By: Kreg ShropshirePrice  DeHay M.D.   On: 02/02/2021 22:38   DG Foot 2 Views Left  Result Date: 02/02/2021 CLINICAL DATA:  Infected wound great toe EXAM: LEFT FOOT - 2 VIEW COMPARISON:  None. FINDINGS: Extensive soft tissue swelling and ulceration of the first digit is seen medial to the interphalangeal joint with subjacent areas of age-indeterminate ulceration on a background of more diffuse spurring. There is a questionable lucency through a osteophyte along the medial base of the first distal phalanx as well which could reflect an acute or cut subacute fracture. Few punctate indeterminate radiodensities are present in the soft tissues, possibly related to erosive changes. Additional degenerative changes including subcortical cystic features about the first interphalangeal joint as well. More diffuse degenerative changes throughout the foot elsewhere are fairly mild-to-moderate. No other acute or conspicuous osseous abnormalities. Bidirectional calcaneal spur. Vascular calcium in the soft tissues. IMPRESSION: Soft tissue swelling and ulceration along the medial first digit at the level of the interphalangeal joint. There is erosive change about the level of the first interphalangeal joint conspicuous for subjacent osteomyelitis. Questionable lucency through a large spur at the base of the first distal phalanx, could reflect a small fracture line as well. More diffuse mild-to-moderate degenerative changes throughout the foot. Bidirectional calcaneal spurs. Electronically Signed   By: Kreg ShropshirePrice  DeHay M.D.   On: 02/02/2021 22:41   Telemetry: Personally  reviewed.  Rhythm is normal sinus rhythm heart rate of 90 bpm.  Assessment/Plan Principal Problem:   Osteomyelitis of great toe of left foot (HCC) with surrounding cellulitis of the entirety of the left foot.   Significant cellulitis of the left foot that has originated from wound that developed on the plantar surface of the great toe of the left foot.  Patient's poorly controlled diabetes unfortunately made patient susceptible for this severe infection.  X-ray of the foot reveals bony destruction of the first interphalangeal joint concerning for osteomyelitis.  Patient has been initiated on intravenous ceftriaxone and vancomycin for treatment of suspected osteomyelitis and surrounding cellulitis.  Hydrating patient with intravenous isotonic fluids  Blood cultures have been obtained  MRI foot has been ordered to better evaluate extent of osteomyelitis and also to confirm whether there is a true fracture of the first distal phalanx.  Orthopedic consultation will  be obtained during dayshift to evaluate for potential surgical intervention including debridement or potential amputation.  Opiate-based analgesics for associated substantial pain as well as scheduled Neurontin due to untreated diabetic neuropathy.  Active Problems:   Lactic acidosis   Patient presenting with lactic acidosis secondary to underlying infection and volume depletion  Hydrating patient with intravenous isotonic fluids and treating patient with intravenous antibiotic therapy  Performing serial lactic acid levels to ensure downtrending and resolution    Uncontrolled type 2 diabetes mellitus with hyperglycemia, without long-term current use of insulin (HCC)   Accu-Cheks before every meal and nightly with signs scale insulin  Holding home regimen of metformin  Hemoglobin A1c pending  If hyperglycemia persists will likely need to place patient on basal bolus insulin therapy.    Essential  hypertension   Notable history of hypertension however patient admits to not taking any antihypertensives for months  Patient presenting with a stop blood pressures in excess of 190 on arrival to the emergency department  Markedly elevated pressures likely secondary to a combination of noncompliance with antihypertensives as well as ongoing pain.  Will initiate patient on a regimen of lisinopril 20 mg daily in the morning assuming renal function remains stable or improved.  This can be titrated throughout the hospitalization to achieve excellent control.  As needed intravenous antibiotics for markedly elevated blood pressures.    Diabetic peripheral neuropathy associated with type 2 diabetes mellitus (HCC)   Initiating Neurontin 300 g nightly.   Code Status:  Full code Family Communication: deferred   Status is: Observation  The patient remains OBS appropriate and will d/c before 2 midnights.  Dispo: The patient is from: Home              Anticipated d/c is to: Home              Patient currently is not medically stable to d/c.   Difficult to place patient No        Marinda Elk MD [facility] Pager [phone]  If 7PM-7AM, please contact night-coverage [email or web address] Use universal [place] password for that web site. If you do not have the password, please call the hospital operator.  02/03/2021, 2:00 AM

## 2021-02-03 NOTE — Consult Note (Signed)
ORTHOPAEDIC CONSULTATION  REQUESTING PHYSICIAN: Glade Lloyd, MD  Chief Complaint: Ulceration and cellulitis swelling and pain left great toe.  HPI: Brett White is a 60 y.o. male who presents with chronic ulcer involving the entire plantar aspect of the left great toe patient presents with acute redness swelling pain and drainage.  Past Medical History:  Diagnosis Date  . Diabetes mellitus without complication [facility])    Past Surgical History:  Procedure Laterality Date  . APPENDECTOMY    . BACK SURGERY    . TONSILLECTOMY     Social History   Socioeconomic History  . Marital status: Single    Spouse name: Not on file  . Number of children: Not on file  . Years of education: Not on file  . Highest education level: Not on file  Occupational History  . Not on file  Tobacco Use  . Smoking status: Never Smoker  . Smokeless tobacco: Never Used  Substance and Sexual Activity  . Alcohol use: Not Currently  . Drug use: Not Currently  . Sexual activity: Not on file  Other Topics Concern  . Not on file  Social History Narrative  . Not on file   Social Determinants of Health   Financial Resource Strain: Not on file  Food Insecurity: Not on file  Transportation Needs: Not on file  Physical Activity: Not on file  Stress: Not on file  Social Connections: Not on file   Family History  Problem Relation Age of Onset  . Heart attack Neg Hx    - negative except otherwise stated in the family history section No Known Allergies Prior to Admission medications   Medication Sig Start Date End Date Taking? Authorizing Provider  diphenhydramine-acetaminophen (TYLENOL PM) 25-500 MG TABS tablet Take 2 tablets by mouth at bedtime as needed.   Yes [provider]  hydrochlorothiazide (HYDRODIURIL) 12.5 MG tablet Take 1 tablet (12.5 mg total) by mouth daily. 11/26/20  Yes Roxy Horseman, PA-C  ibuprofen (ADVIL) 600 MG tablet Take 1 tablet (600 mg total) by mouth every 6  (six) hours as needed. Patient not taking: Reported on 02/03/2021 11/05/20   Legrand Rams   DG Chest 2 View  Result Date: 02/02/2021 CLINICAL DATA:  Suspected sepsis EXAM: CHEST - 2 VIEW COMPARISON:  None. FINDINGS: No consolidation, features of edema, pneumothorax, or effusion. Pulmonary vascularity is normally distributed. The cardiomediastinal contours are unremarkable. No acute osseous or soft tissue abnormality. Degenerative changes are present in the imaged spine and shoulders. IMPRESSION: No acute cardiopulmonary abnormality. Electronically Signed   By: Kreg Shropshire M.D.   On: 02/02/2021 22:38   MR FOOT LEFT WO CONTRAST  Result Date: 02/03/2021 CLINICAL DATA:  Osteomyelitis.  Hypertension and diabetes. EXAM: MRI OF THE LEFT FOOT WITHOUT CONTRAST TECHNIQUE: Multiplanar, multisequence MR imaging of the left forefoot was performed. No intravenous contrast was administered. COMPARISON:  Radiographs 02/02/2021 FINDINGS: Bones/Joint/Cartilage Erosions of the medial head of the proximal phalanx of the great toe and of the medial base of the distal phalanx of the great toe are present along with marrow edema in the distal phalanx and distally in the proximal phalanx. Although gout arthropathy could cause a similar imaging appearance, the appearance is concerning for osteomyelitis given the clinical scenario. This also on appearance of suspected marrow edema in the distal phalanges of the third and fifth toes such that osteomyelitis cannot be excluded in these locations either. Notable arthropathy at the Lisfranc joint especially along the first digit  dorsally, with subcortical marrow edema, degenerative subcortical cysts, and notable spurring. No malalignment at the Lisfranc joint. Ligaments The Lisfranc ligament appears intact. Muscles and Tendons Diffuse edema within regional musculature, probably neurogenic given the diffuse appearance. Soft tissues Dorsal subcutaneous edema in the foot tracking into  the toes, nonspecific but cellulitis is not excluded. Edema in the toes is most confluent in the great toe. IMPRESSION: 1. Substantial erosive arthropathy at the interphalangeal joint of the great toe medially, with associated marrow edema in the distal phalanx and distally in the proximal phalanx. Given the swelling in the toe and clinical context, the appearance is suspicious for osteomyelitis, with gout arthropathy being a less likely differential diagnostic consideration. 2. Marrow edema in the distal phalanx small toe could also be from osteomyelitis. Marrow edema in the distal phalanx of the third toe is not as convincing but is still abnormal. 3. Dorsal subcutaneous edema in the foot may be from cellulitis. 4. Diffuse muscular edema probably neurogenic. 5. Arthropathy at the Lisfranc joint especially medially. No Lisfranc joint malalignment, the Lisfranc ligament appears intact. Electronically Signed   By: Gaylyn Rong M.D.   On: 02/03/2021 08:19   DG Foot 2 Views Left  Result Date: 02/02/2021 CLINICAL DATA:  Infected wound great toe EXAM: LEFT FOOT - 2 VIEW COMPARISON:  None. FINDINGS: Extensive soft tissue swelling and ulceration of the first digit is seen medial to the interphalangeal joint with subjacent areas of age-indeterminate ulceration on a background of more diffuse spurring. There is a questionable lucency through a osteophyte along the medial base of the first distal phalanx as well which could reflect an acute or cut subacute fracture. Few punctate indeterminate radiodensities are present in the soft tissues, possibly related to erosive changes. Additional degenerative changes including subcortical cystic features about the first interphalangeal joint as well. More diffuse degenerative changes throughout the foot elsewhere are fairly mild-to-moderate. No other acute or conspicuous osseous abnormalities. Bidirectional calcaneal spur. Vascular calcium in the soft tissues. IMPRESSION: Soft  tissue swelling and ulceration along the medial first digit at the level of the interphalangeal joint. There is erosive change about the level of the first interphalangeal joint conspicuous for subjacent osteomyelitis. Questionable lucency through a large spur at the base of the first distal phalanx, could reflect a small fracture line as well. More diffuse mild-to-moderate degenerative changes throughout the foot. Bidirectional calcaneal spurs. Electronically Signed   By: Kreg Shropshire M.D.   On: 02/02/2021 22:41   - pertinent xrays, CT, MRI studies were reviewed and independently interpreted  Positive ROS: All other systems have been reviewed and were otherwise negative with the exception of those mentioned in the HPI and as above.  Physical Exam: General: Alert, no acute distress Psychiatric: Patient is competent for consent with normal mood and affect Lymphatic: No axillary or cervical lymphadenopathy Cardiovascular: No pedal edema Respiratory: No cyanosis, no use of accessory musculature GI: No organomegaly, abdomen is soft and non-tender    Images:  @ENCIMAGES @  Labs:  Lab Results  Component Value Date   HGBA1C 11.5 (H) 02/03/2021   REPTSTATUS PENDING 02/02/2021   CULT  02/02/2021    NO GROWTH < 12 HOURS Performed at [facility], [address] [place], [place]     Lab Results  Component Value Date   ALBUMIN 3.5 02/03/2021   ALBUMIN 3.7 02/02/2021   ALBUMIN 3.7 11/25/2020     CBC EXTENDED Latest Ref Rng & Units 02/03/2021 02/02/2021 11/25/2020  WBC  4.0 - 10.5 K/uL 7.7 8.2 6.6  RBC 4.22 - 5.81 MIL/uL 4.48 4.65 5.34  HGB 13.0 - 17.0 g/dL 12.9(L) 13.4 15.0  HCT 39.0 - 52.0 % 39.9 41.3 46.5  PLT 150 - 400 K/uL 312 335 276  NEUTROABS 1.7 - 7.7 K/uL 4.9 6.2 -  LYMPHSABS 0.7 - 4.0 K/uL 2.0 1.4 -    Neurologic: Patient does not have protective sensation bilateral lower extremities.   MUSCULOSKELETAL:   Skin: Patient has a strong dorsalis pedis and  posterior tibial pulse.  He has sausage digit swelling of the left great toe with cellulitis.  There is a necrotic ulcer on the plantar aspect left great toe that encompasses the entire plantar aspect of this toe.  This extends down to bone.  Review of the radiographs shows some cystic changes in the bone.  Review of the MRI scan shows increased edema in the tuft of the great toe consistent with osteomyelitis.  Hemoglobin A1c 11.5.  Hemoglobin 12.9 with an albumin of 3.5.  Patient denies a history of gout.  Patient states he is not a smoker.  Assessment: Assessment: Osteomyelitis ulceration cellulitis and purulent drainage left great toe.  Plan: Will plan for amputation left great toe through the MTP joint.  Risk and benefits were discussed including risk of the wound not healing need for nonweightbearing for 2 weeks.  Patient states he understands wished to proceed at this time  Thank you for the consult and the opportunity to see Brett White  Aldean Baker, MD [facility] [phone] 10:46 AM

## 2021-02-04 ENCOUNTER — Encounter (HOSPITAL_COMMUNITY)
Admission: EM | Disposition: A | Discharge status: Skilled Nursing Facility | Payer: Self-pay | Source: Home / Self Care | Attending: Internal Medicine

## 2021-02-04 ENCOUNTER — Encounter (HOSPITAL_COMMUNITY): Payer: Self-pay | Admitting: Internal Medicine

## 2021-02-04 ENCOUNTER — Inpatient Hospital Stay (HOSPITAL_COMMUNITY): Payer: Medicare HMO | Admitting: Certified Registered Nurse Anesthetist

## 2021-02-04 DIAGNOSIS — E1165 Type 2 diabetes mellitus with hyperglycemia: Secondary | ICD-10-CM | POA: Diagnosis not present

## 2021-02-04 DIAGNOSIS — L03032 Cellulitis of left toe: Secondary | ICD-10-CM | POA: Diagnosis not present

## 2021-02-04 DIAGNOSIS — I10 Essential (primary) hypertension: Secondary | ICD-10-CM | POA: Diagnosis not present

## 2021-02-04 HISTORY — PX: AMPUTATION: SHX166

## 2021-02-04 LAB — BASIC METABOLIC PANEL
Anion gap: 7 (ref 5–15)
BUN: 26 mg/dL — ABNORMAL HIGH (ref 6–20)
CO2: 27 mmol/L (ref 22–32)
Calcium: 8.6 mg/dL — ABNORMAL LOW (ref 8.9–10.3)
Chloride: 102 mmol/L (ref 98–111)
Creatinine, Ser: 1.13 mg/dL (ref 0.61–1.24)
GFR, Estimated: >60 mL/min (ref >60)
Glucose, Bld: 141 mg/dL — ABNORMAL HIGH (ref 70–99)
Potassium: 3.9 mmol/L (ref 3.5–5.1)
Sodium: 136 mmol/L (ref 135–145)

## 2021-02-04 LAB — CBC WITH DIFFERENTIAL/PLATELET
Abs Immature Granulocytes: 0.01 10*3/uL (ref 0.00–0.07)
Basophils Absolute: 0 10*3/uL (ref 0.0–0.1)
Basophils Relative: 1 %
Eosinophils Absolute: 0.2 10*3/uL (ref 0.0–0.5)
Eosinophils Relative: 4 %
HCT: 35.3 % — ABNORMAL LOW (ref 39.0–52.0)
Hemoglobin: 11.7 g/dL — ABNORMAL LOW (ref 13.0–17.0)
Immature Granulocytes: 0 %
Lymphocytes Relative: 29 %
Lymphs Abs: 1.5 10*3/uL (ref 0.7–4.0)
MCH: 29 pg (ref 26.0–34.0)
MCHC: 33.1 g/dL (ref 30.0–36.0)
MCV: 87.6 fL (ref 80.0–100.0)
Monocytes Absolute: 0.5 10*3/uL (ref 0.1–1.0)
Monocytes Relative: 9 %
Neutro Abs: 2.9 10*3/uL (ref 1.7–7.7)
Neutrophils Relative %: 57 %
Platelets: 283 10*3/uL (ref 150–400)
RBC: 4.03 MIL/uL — ABNORMAL LOW (ref 4.22–5.81)
RDW: 12 % (ref 11.5–15.5)
WBC: 5.1 10*3/uL (ref 4.0–10.5)
nRBC: 0 % (ref 0.0–0.2)

## 2021-02-04 LAB — GLUCOSE, CAPILLARY
Glucose-Capillary: 193 mg/dL — ABNORMAL HIGH (ref 70–99)
Glucose-Capillary: 213 mg/dL — ABNORMAL HIGH (ref 70–99)
Glucose-Capillary: 208 mg/dL — ABNORMAL HIGH (ref 70–99)
Glucose-Capillary: 255 mg/dL — ABNORMAL HIGH (ref 70–99)
Glucose-Capillary: 314 mg/dL — ABNORMAL HIGH (ref 70–99)

## 2021-02-04 LAB — C-REACTIVE PROTEIN: CRP: 0.9 mg/dL (ref <1.0)

## 2021-02-04 LAB — MAGNESIUM: Magnesium: 2.1 mg/dL (ref 1.7–2.4)

## 2021-02-04 SURGERY — AMPUTATION DIGIT
Anesthesia: General | Laterality: Left

## 2021-02-04 MED ORDER — DEXAMETHASONE SODIUM PHOSPHATE 10 MG/ML IJ SOLN
INTRAMUSCULAR | Status: DC | PRN
  Administered 2021-02-04: 5 mg via INTRAVENOUS

## 2021-02-04 MED ORDER — ONDANSETRON HCL 4 MG/2ML IJ SOLN
INTRAMUSCULAR | Status: DC | PRN
  Administered 2021-02-04: 4 mg via INTRAVENOUS

## 2021-02-04 MED ORDER — HYDRALAZINE HCL 20 MG/ML IJ SOLN
5.0000 mg | INTRAMUSCULAR | Status: DC | PRN
Start: 2021-02-04 — End: 2021-02-19
  Administered 2021-02-06: 5 mg via INTRAVENOUS
  Filled 2021-02-04: qty 1

## 2021-02-04 MED ORDER — PHENYLEPHRINE 40 MCG/ML (10ML) SYRINGE FOR IV PUSH (FOR BLOOD PRESSURE SUPPORT)
PREFILLED_SYRINGE | INTRAVENOUS | Status: DC | PRN
  Administered 2021-02-04 (×2): 80 ug via INTRAVENOUS
  Administered 2021-02-04: 120 ug via INTRAVENOUS

## 2021-02-04 MED ORDER — FENTANYL CITRATE (PF) 250 MCG/5ML IJ SOLN
INTRAMUSCULAR | Status: DC | PRN
  Administered 2021-02-04 (×2): 50 ug via INTRAVENOUS

## 2021-02-04 MED ORDER — CHLORHEXIDINE GLUCONATE 0.12 % MT SOLN: 15.0000 mL | Freq: Once | OROMUCOSAL | Status: AC

## 2021-02-04 MED ORDER — MEPERIDINE HCL 25 MG/ML IJ SOLN: 6.2500 mg | INTRAMUSCULAR | Status: DC | PRN

## 2021-02-04 MED ORDER — PHENOL 1.4 % MT LIQD: 1.0000 | OROMUCOSAL | Status: DC | PRN

## 2021-02-04 MED ORDER — METOPROLOL TARTRATE 5 MG/5ML IV SOLN: 2.0000 mg | INTRAVENOUS | Status: DC | PRN

## 2021-02-04 MED ORDER — DOCUSATE SODIUM 100 MG PO CAPS
100.0000 mg | ORAL_CAPSULE | Freq: Every day | ORAL | Status: DC
  Administered 2021-02-04 – 2021-02-19 (×14): 100 mg via ORAL
  Filled 2021-02-04 (×15): qty 1

## 2021-02-04 MED ORDER — INSULIN GLARGINE 100 UNIT/ML ~~LOC~~ SOLN
10.0000 [IU] | Freq: Every day | SUBCUTANEOUS | Status: DC
  Administered 2021-02-04: 10 [IU] via SUBCUTANEOUS
  Filled 2021-02-04 (×2): qty 0.1

## 2021-02-04 MED ORDER — JUVEN PO PACK
1.0000 | PACK | Freq: Two times a day (BID) | ORAL | Status: DC
  Administered 2021-02-04 – 2021-02-19 (×25): 1 via ORAL
  Filled 2021-02-04 (×21): qty 1

## 2021-02-04 MED ORDER — GUAIFENESIN-DM 100-10 MG/5ML PO SYRP: 15.0000 mL | ORAL_SOLUTION | ORAL | Status: DC | PRN

## 2021-02-04 MED ORDER — MIDAZOLAM HCL 2 MG/2ML IJ SOLN
INTRAMUSCULAR | Status: AC
  Filled 2021-02-04: qty 2

## 2021-02-04 MED ORDER — OXYCODONE HCL 5 MG PO TABS
10.0000 mg | ORAL_TABLET | ORAL | Status: DC | PRN
  Administered 2021-02-04 – 2021-02-05 (×4): 15 mg via ORAL
  Administered 2021-02-05: 10 mg via ORAL
  Administered 2021-02-06 – 2021-02-19 (×45): 15 mg via ORAL
  Filled 2021-02-04 (×12): qty 3
  Filled 2021-02-04: qty 2
  Filled 2021-02-04 (×37): qty 3

## 2021-02-04 MED ORDER — LACTATED RINGERS IV SOLN: INTRAVENOUS | Status: DC

## 2021-02-04 MED ORDER — AMISULPRIDE (ANTIEMETIC) 5 MG/2ML IV SOLN: 10.0000 mg | Freq: Once | INTRAVENOUS | Status: DC | PRN

## 2021-02-04 MED ORDER — RISAQUAD PO CAPS
1.0000 | ORAL_CAPSULE | Freq: Every day | ORAL | Status: DC
  Administered 2021-02-05 – 2021-02-19 (×14): 1 via ORAL
  Filled 2021-02-04 (×16): qty 1

## 2021-02-04 MED ORDER — LIDOCAINE 2% (20 MG/ML) 5 ML SYRINGE
INTRAMUSCULAR | Status: DC | PRN
  Administered 2021-02-04: 40 mg via INTRAVENOUS

## 2021-02-04 MED ORDER — PROPOFOL 10 MG/ML IV BOLUS
INTRAVENOUS | Status: AC
  Filled 2021-02-04: qty 20

## 2021-02-04 MED ORDER — CEFAZOLIN SODIUM-DEXTROSE 2-3 GM-%(50ML) IV SOLR
INTRAVENOUS | Status: DC | PRN
  Administered 2021-02-04: 2 g via INTRAVENOUS

## 2021-02-04 MED ORDER — INSULIN ASPART 100 UNIT/ML IJ SOLN
INTRAMUSCULAR | Status: AC
  Filled 2021-02-04: qty 1

## 2021-02-04 MED ORDER — ACETAMINOPHEN 325 MG PO TABS: 325.0000 mg | ORAL_TABLET | Freq: Once | ORAL | Status: DC | PRN

## 2021-02-04 MED ORDER — POLYETHYLENE GLYCOL 3350 17 G PO PACK: 17.0000 g | PACK | Freq: Every day | ORAL | Status: DC | PRN

## 2021-02-04 MED ORDER — MAGNESIUM CITRATE PO SOLN: 1.0000 | Freq: Once | ORAL | Status: DC | PRN

## 2021-02-04 MED ORDER — BISACODYL 5 MG PO TBEC: 5.0000 mg | DELAYED_RELEASE_TABLET | Freq: Every day | ORAL | Status: DC | PRN

## 2021-02-04 MED ORDER — ACETAMINOPHEN 160 MG/5ML PO SOLN: 325.0000 mg | Freq: Once | ORAL | Status: DC | PRN

## 2021-02-04 MED ORDER — MAGNESIUM SULFATE 2 GM/50ML IV SOLN
2.0000 g | Freq: Every day | INTRAVENOUS | Status: DC | PRN
Start: 2021-02-04 — End: 2021-02-19
  Filled 2021-02-04: qty 50

## 2021-02-04 MED ORDER — ORAL CARE MOUTH RINSE: 15.0000 mL | Freq: Once | OROMUCOSAL | Status: AC

## 2021-02-04 MED ORDER — ALUM & MAG HYDROXIDE-SIMETH 200-200-20 MG/5ML PO SUSP: 15.0000 mL | ORAL | Status: DC | PRN

## 2021-02-04 MED ORDER — ACETAMINOPHEN 325 MG PO TABS
325.0000 mg | ORAL_TABLET | Freq: Four times a day (QID) | ORAL | Status: DC | PRN
  Administered 2021-02-12 – 2021-02-15 (×4): 650 mg via ORAL
  Filled 2021-02-04 (×5): qty 2

## 2021-02-04 MED ORDER — CEFAZOLIN SODIUM-DEXTROSE 2-4 GM/100ML-% IV SOLN
2.0000 g | Freq: Three times a day (TID) | INTRAVENOUS | Status: AC
  Administered 2021-02-04 (×2): 2 g via INTRAVENOUS
  Filled 2021-02-04 (×2): qty 100

## 2021-02-04 MED ORDER — SODIUM CHLORIDE 0.9 % IV SOLN: INTRAVENOUS | Status: DC

## 2021-02-04 MED ORDER — ACETAMINOPHEN 10 MG/ML IV SOLN: 1000.0000 mg | Freq: Once | INTRAVENOUS | Status: DC | PRN

## 2021-02-04 MED ORDER — ASCORBIC ACID 500 MG PO TABS
1000.0000 mg | ORAL_TABLET | Freq: Every day | ORAL | Status: DC
  Administered 2021-02-04 – 2021-02-19 (×15): 1000 mg via ORAL
  Filled 2021-02-04 (×15): qty 2

## 2021-02-04 MED ORDER — HYDROMORPHONE HCL 1 MG/ML IJ SOLN
0.5000 mg | INTRAMUSCULAR | Status: DC | PRN
  Administered 2021-02-04 (×3): 1 mg via INTRAVENOUS
  Filled 2021-02-04 (×3): qty 1

## 2021-02-04 MED ORDER — LABETALOL HCL 5 MG/ML IV SOLN: 10.0000 mg | INTRAVENOUS | Status: DC | PRN

## 2021-02-04 MED ORDER — POTASSIUM CHLORIDE CRYS ER 20 MEQ PO TBCR: 20.0000 meq | EXTENDED_RELEASE_TABLET | Freq: Every day | ORAL | Status: DC | PRN

## 2021-02-04 MED ORDER — MIDAZOLAM HCL 2 MG/2ML IJ SOLN
INTRAMUSCULAR | Status: DC | PRN
  Administered 2021-02-04: 2 mg via INTRAVENOUS

## 2021-02-04 MED ORDER — ONDANSETRON HCL 4 MG/2ML IJ SOLN
4.0000 mg | Freq: Four times a day (QID) | INTRAMUSCULAR | Status: DC | PRN
  Administered 2021-02-04 – 2021-02-08 (×4): 4 mg via INTRAVENOUS
  Filled 2021-02-04 (×4): qty 2

## 2021-02-04 MED ORDER — VITAMIN D (ERGOCALCIFEROL) 1.25 MG (50000 UNIT) PO CAPS
50000.0000 [IU] | ORAL_CAPSULE | Freq: Every day | ORAL | Status: AC
  Administered 2021-02-04: 50000 [IU] via ORAL
  Filled 2021-02-04: qty 1

## 2021-02-04 MED ORDER — FENTANYL CITRATE (PF) 250 MCG/5ML IJ SOLN
INTRAMUSCULAR | Status: AC
  Filled 2021-02-04: qty 5

## 2021-02-04 MED ORDER — PROMETHAZINE HCL 25 MG/ML IJ SOLN: 6.2500 mg | INTRAMUSCULAR | Status: DC | PRN

## 2021-02-04 MED ORDER — 0.9 % SODIUM CHLORIDE (POUR BTL) OPTIME
TOPICAL | Status: DC | PRN
  Administered 2021-02-04: 1000 mL

## 2021-02-04 MED ORDER — HYDROMORPHONE HCL 1 MG/ML IJ SOLN: 0.2500 mg | INTRAMUSCULAR | Status: DC | PRN

## 2021-02-04 MED ORDER — PROPOFOL 10 MG/ML IV BOLUS
INTRAVENOUS | Status: DC | PRN
  Administered 2021-02-04: 200 mg via INTRAVENOUS

## 2021-02-04 MED ORDER — ENOXAPARIN SODIUM 40 MG/0.4ML IJ SOSY
40.0000 mg | PREFILLED_SYRINGE | INTRAMUSCULAR | Status: DC
  Administered 2021-02-05 – 2021-02-09 (×4): 40 mg via SUBCUTANEOUS
  Filled 2021-02-04 (×5): qty 0.4

## 2021-02-04 MED ORDER — ZINC SULFATE 220 (50 ZN) MG PO CAPS
220.0000 mg | ORAL_CAPSULE | Freq: Every day | ORAL | Status: AC
  Administered 2021-02-04 – 2021-02-17 (×13): 220 mg via ORAL
  Filled 2021-02-04 (×13): qty 1

## 2021-02-04 MED ORDER — CHLORHEXIDINE GLUCONATE 0.12 % MT SOLN
OROMUCOSAL | Status: AC
  Administered 2021-02-04: 15 mL via OROMUCOSAL
  Filled 2021-02-04: qty 15

## 2021-02-04 MED ORDER — PANTOPRAZOLE SODIUM 40 MG PO TBEC
40.0000 mg | DELAYED_RELEASE_TABLET | Freq: Every day | ORAL | Status: DC
  Administered 2021-02-04 – 2021-02-19 (×15): 40 mg via ORAL
  Filled 2021-02-04 (×14): qty 1

## 2021-02-04 MED ORDER — OXYCODONE HCL 5 MG PO TABS: 5.0000 mg | ORAL_TABLET | ORAL | Status: DC | PRN

## 2021-02-04 SURGICAL SUPPLY — 29 items
BLADE SURG 21 STRL SS (BLADE) ×2 IMPLANT
BNDG CMPR 9X4 STRL LF SNTH (GAUZE/BANDAGES/DRESSINGS)
BNDG COHESIVE 4X5 TAN STRL (GAUZE/BANDAGES/DRESSINGS) ×2 IMPLANT
BNDG ESMARK 4X9 LF (GAUZE/BANDAGES/DRESSINGS) IMPLANT
BNDG GAUZE ELAST 4 BULKY (GAUZE/BANDAGES/DRESSINGS) ×2 IMPLANT
COVER SURGICAL LIGHT HANDLE (MISCELLANEOUS) ×4 IMPLANT
COVER WAND RF STERILE (DRAPES) ×2 IMPLANT
DRAPE U-SHAPE 47X51 STRL (DRAPES) ×2 IMPLANT
DRSG ADAPTIC 3X8 NADH LF (GAUZE/BANDAGES/DRESSINGS) IMPLANT
DRSG PAD ABDOMINAL 8X10 ST (GAUZE/BANDAGES/DRESSINGS) ×2 IMPLANT
DURAPREP 26ML APPLICATOR (WOUND CARE) ×2 IMPLANT
ELECT REM PT RETURN 9FT ADLT (ELECTROSURGICAL) ×2
ELECTRODE REM PT RTRN 9FT ADLT (ELECTROSURGICAL) ×1 IMPLANT
GAUZE SPONGE 4X4 12PLY STRL (GAUZE/BANDAGES/DRESSINGS) ×1 IMPLANT
GLOVE BIOGEL PI IND STRL 9 (GLOVE) ×1 IMPLANT
GLOVE BIOGEL PI INDICATOR 9 (GLOVE) ×1
GLOVE SURG ORTHO 9.0 STRL STRW (GLOVE) ×2 IMPLANT
GOWN STRL REUS W/ TWL XL LVL3 (GOWN DISPOSABLE) ×2 IMPLANT
GOWN STRL REUS W/TWL XL LVL3 (GOWN DISPOSABLE) ×4
KIT BASIN OR (CUSTOM PROCEDURE TRAY) ×2 IMPLANT
KIT TURNOVER KIT B (KITS) ×2 IMPLANT
MANIFOLD NEPTUNE II (INSTRUMENTS) ×2 IMPLANT
NEEDLE 22X1 1/2 (OR ONLY) (NEEDLE) IMPLANT
NS IRRIG 1000ML POUR BTL (IV SOLUTION) ×2 IMPLANT
PACK ORTHO EXTREMITY (CUSTOM PROCEDURE TRAY) ×2 IMPLANT
PAD ARMBOARD 7.5X6 YLW CONV (MISCELLANEOUS) ×4 IMPLANT
SUT ETHILON 2 0 PSLX (SUTURE) ×2 IMPLANT
SYR CONTROL 10ML LL (SYRINGE) IMPLANT
TOWEL GREEN STERILE (TOWEL DISPOSABLE) ×2 IMPLANT

## 2021-02-04 NOTE — Progress Notes (Signed)
Patient ID: Brett White, male   DOB: November 13, 1960, 60 y.o.   MRN: [id number]  PROGRESS NOTE    Brett White  YWV:[id number] DOB: 12-15-1960 DOA: 02/02/2021 PCP: Patient, [name]   Brief Narrative:  60 year old male with history of hypertension, diabetes mellitus type 2, previous unspecified CVA in 2015, diabetic neuropathy presented with worsening left foot pain.  On presentation, left foot x-ray showed findings concerning for erosions of the first interphalangeal joint concerning for osteomyelitis.  He was started on broad-spectrum antibiotics.  Assessment & Plan:   Acute osteomyelitis of great toe of left foot with surrounding cellulitis -X-ray of the foot reveals bony destruction of the first interphalangeal joint concerning for osteomyelitis. -Currently on broad-spectrum antibiotics.  Blood cultures negative so far -Orthopedics following: Status post surgical intervention today.  Wound care as per orthopedics recommendations -Pain management  Diabetes mellitus type 2 uncontrolled with hyperglycemia -Continue CBGs with SSI  Hypertension -Not taking any antihypertensives for months.  Monitor blood pressure.  Continue hydrochlorothiazide and lisinopril  Diabetic neuropathy -Continue gabapentin  Mild hyponatremia -Resolved.  Treated with IV fluids   DVT prophylaxis: SCDs Code Status: Full Family Communication: None at bedside Disposition Plan: Status is: Inpatient because of severity of illness requiring IV antibiotics and surgical intervention  Dispo: The patient is from: Home              Anticipated d/c is to: Home in 1 to 2 days if cleared by orthopedics and tolerates PT              Patient currently is not medically stable to d/c.   Difficult to place patient No   Consultants: Orthopedics  Procedures: Left great toe amputation on 02/04/2021  Antimicrobials:  Anti-infectives (From admission, onward)   Start     Dose/Rate Route Frequency Ordered Stop    02/04/21 0800  ceFAZolin (ANCEF) IVPB 2g/100 mL premix        2 g 200 mL/hr over 30 Minutes Intravenous To ShortStay Surgical 02/03/21 1206 02/05/21 0800   02/03/21 2000  [MAR Hold]  vancomycin (VANCOREADY) IVPB 1000 mg/200 mL        (MAR Hold since Sat 02/04/2021 at 0636.Hold Reason: Transfer to a Procedural area.)   1,000 mg 200 mL/hr over 60 Minutes Intravenous Every 12 hours 02/02/21 2327     02/03/21 0800  [MAR Hold]  cefTRIAXone (ROCEPHIN) 2 g in sodium chloride 0.9 % 100 mL IVPB        (MAR Hold since Sat 02/04/2021 at 0636.Hold Reason: Transfer to a Procedural area.)   2 g 200 mL/hr over 30 Minutes Intravenous Every 24 hours 02/03/21 0159     02/03/21 0800  [MAR Hold]  metroNIDAZOLE (FLAGYL) IVPB 500 mg        (MAR Hold since Sat 02/04/2021 at 0636.Hold Reason: Transfer to a Procedural area.)   500 mg 100 mL/hr over 60 Minutes Intravenous Every 8 hours 02/03/21 0752     02/02/21 2230  piperacillin-tazobactam (ZOSYN) IVPB 3.375 g        3.375 g 100 mL/hr over 30 Minutes Intravenous  Once 02/02/21 2224 02/03/21 0205   02/02/21 2230  vancomycin (VANCOREADY) IVPB 1750 mg/350 mL        1,750 mg 175 mL/hr over 120 Minutes Intravenous  Once 02/02/21 2224 02/03/21 0835      Subjective: Patient seen and examined at bedside.  No overnight fever, vomiting, worsening shortness of breath reported.  Still complains of intermittent left foot pain.  Objective: Vitals:   02/03/21 1100 02/03/21 1345 02/03/21 2036 02/04/21 0415  BP: (!) 172/103 126/85 112/78 106/67  Pulse: 87 91 73 67  Resp: 18 18 16 16   Temp: 98 F (36.7 C) 98 F (36.7 C) 98.1 F (36.7 C)   TempSrc:   Oral   SpO2: 100% 97% 94% 99%  Weight:      Height:        Intake/Output Summary (Last 24 hours) at 02/04/2021 0757 Last data filed at 02/04/2021 0400 Gross per 24 hour  Intake 908.91 ml  Output --  Net 908.91 ml   Filed Weights   02/02/21 2200  Weight: 97.5 kg    Examination:  General exam: No acute distress.  Currently  on room air. Respiratory system: Decreased breath sounds at bases bilaterally with some scattered rales Cardiovascular system: Rate controlled, S1-S2 heard Gastrointestinal system: Abdomen is slightly distended, soft and nontender.  Bowel sounds are heard  extremities: Mild lower extremity edema present; left foot dressing present  Data Reviewed: I have personally reviewed following labs and imaging studies  CBC: Recent Labs  Lab 02/02/21 2220 02/03/21 0217 02/04/21 0114  WBC 8.2 7.7 5.1  NEUTROABS 6.2 4.9 2.9  HGB 13.4 12.9* 11.7*  HCT 41.3 39.9 35.3*  MCV 88.8 89.1 87.6  PLT 335 312 081   Basic Metabolic Panel: Recent Labs  Lab 02/02/21 2220 02/03/21 0217 02/04/21 0114  NA 135 134* 136  K 3.7 3.8 3.9  CL 100 103 102  CO2 25 24 27   GLUCOSE 311* 259* 141*  BUN 20 21* 26*  CREATININE 1.38* 1.32* 1.13  CALCIUM 8.9 8.8* 8.6*  MG  --  2.1 2.1   GFR: Estimated Creatinine Clearance: 86.2 mL/min (by C-G formula based on SCr of 1.13 mg/dL). Liver Function Tests: Recent Labs  Lab 02/02/21 2220 02/03/21 0217  AST 16 12*  ALT 13 9  ALKPHOS 82 75  BILITOT 0.6 0.7  PROT 7.7 7.2  ALBUMIN 3.7 3.5   No results for input(s): LIPASE, AMYLASE in the last 168 hours. No results for input(s): AMMONIA in the last 168 hours. Coagulation Profile: Recent Labs  Lab 02/02/21 2220  INR 1.0   Cardiac Enzymes: No results for input(s): CKTOTAL, CKMB, CKMBINDEX, TROPONINI in the last 168 hours. BNP (last 3 results) No results for input(s): PROBNP in the last 8760 hours. HbA1C: Recent Labs    02/03/21 0217  HGBA1C 11.5*   CBG: Recent Labs  Lab 02/03/21 0635 02/03/21 1153 02/03/21 1642 02/03/21 2038 02/04/21 0551  GLUCAP 253* 118* 238* 216* 193*   Lipid Profile: No results for input(s): CHOL, HDL, LDLCALC, TRIG, CHOLHDL, LDLDIRECT in the last 72 hours. Thyroid Function Tests: No results for input(s): TSH, T4TOTAL, FREET4, T3FREE, THYROIDAB in the last 72 hours. Anemia  Panel: No results for input(s): VITAMINB12, FOLATE, FERRITIN, TIBC, IRON, RETICCTPCT in the last 72 hours. Sepsis Labs: Recent Labs  Lab 02/02/21 2220 02/03/21 0201  LATICACIDVEN 2.5* 1.0    Recent Results (from the past 240 hour(s))  Culture, blood (Routine x 2)     Status: None (Preliminary result)   Collection Time: 02/02/21 10:17 PM   Specimen: BLOOD RIGHT HAND  Result Value Ref Range Status   Specimen Description BLOOD RIGHT HAND  Final   Special Requests AEROBIC BOTTLE ONLY Blood Culture adequate volume  Final   Culture   Final    NO GROWTH < 12 HOURS Performed at [facility], [place] [address], [place], [place]  [address]    Report Status PENDING  Incomplete  Culture, blood (Routine x 2)     Status: None (Preliminary result)   Collection Time: 02/02/21 10:20 PM   Specimen: BLOOD LEFT HAND  Result Value Ref Range Status   Specimen Description BLOOD LEFT HAND  Final   Special Requests   Final    BOTTLES DRAWN AEROBIC AND ANAEROBIC Blood Culture adequate volume   Culture   Final    NO GROWTH < 12 HOURS Performed at [facility], [place] [address], [place], [place] [address]    Report Status PENDING  Incomplete  Resp Panel by RT-PCR (Flu A&B, Covid) Nasopharyngeal Swab     Status: None   Collection Time: 02/03/21 12:26 PM   Specimen: Nasopharyngeal Swab; Nasopharyngeal(NP) swabs in vial transport medium  Result Value Ref Range Status   SARS Coronavirus 2 by RT PCR NEGATIVE NEGATIVE Final    Comment: (NOTE) SARS-CoV-2 target nucleic acids are NOT DETECTED.  The SARS-CoV-2 RNA is generally detectable in upper respiratory specimens during the acute phase of infection. The lowest concentration of SARS-CoV-2 viral copies this assay can detect is 138 copies/mL. A negative result does not preclude SARS-Cov-2 infection and should not be used as the sole basis for treatment or other patient management decisions. A negative result may occur with  improper specimen  collection/handling, submission of specimen other than nasopharyngeal swab, presence of viral mutation(s) within the areas targeted by this assay, and inadequate number of viral copies(<138 copies/mL). A negative result must be combined with clinical observations, patient history, and epidemiological information. The expected result is Negative.  Fact Sheet for Patients:  EntrepreneurPulse.com.au  Fact Sheet for Healthcare Providers:  IncredibleEmployment.be  This test is no t yet approved or cleared by the [place] FDA and  has been authorized for detection and/or diagnosis of SARS-CoV-2 by FDA under an Emergency Use Authorization (EUA). This EUA will remain  in effect (meaning this test can be used) for the duration of the COVID-19 declaration under Section 564(b)(1) of the Act, 21 U.S.C.section 360bbb-3(b)(1), unless the authorization is terminated  or revoked sooner.       Influenza A by PCR NEGATIVE NEGATIVE Final   Influenza B by PCR NEGATIVE NEGATIVE Final    Comment: (NOTE) The Xpert Xpress SARS-CoV-2/FLU/RSV plus assay is intended as an aid in the diagnosis of influenza from Nasopharyngeal swab specimens and should not be used as a sole basis for treatment. Nasal washings and aspirates are unacceptable for Xpert Xpress SARS-CoV-2/FLU/RSV testing.  Fact Sheet for Patients: EntrepreneurPulse.com.au  Fact Sheet for Healthcare Providers: IncredibleEmployment.be  This test is not yet approved or cleared by the [place] FDA and has been authorized for detection and/or diagnosis of SARS-CoV-2 by FDA under an Emergency Use Authorization (EUA). This EUA will remain in effect (meaning this test can be used) for the duration of the COVID-19 declaration under Section 564(b)(1) of the Act, 21 U.S.C. section 360bbb-3(b)(1), unless the authorization is terminated or revoked.  Performed at [facility], [place] [address], [place], [place] [address]          Radiology Studies: DG Chest 2 View  Result Date: 02/02/2021 CLINICAL DATA:  Suspected sepsis EXAM: CHEST - 2 VIEW COMPARISON:  None. FINDINGS: No consolidation, features of edema, pneumothorax, or effusion. Pulmonary vascularity is normally distributed. The cardiomediastinal contours are unremarkable. No acute osseous or soft tissue abnormality. Degenerative changes are present in the imaged spine and shoulders. IMPRESSION: No  acute cardiopulmonary abnormality. Electronically Signed   By: Lovena Le M.D.   On: 02/02/2021 22:38   MR FOOT LEFT WO CONTRAST  Result Date: 02/03/2021 CLINICAL DATA:  Osteomyelitis.  Hypertension and diabetes. EXAM: MRI OF THE LEFT FOOT WITHOUT CONTRAST TECHNIQUE: Multiplanar, multisequence MR imaging of the left forefoot was performed. No intravenous contrast was administered. COMPARISON:  Radiographs 02/02/2021 FINDINGS: Bones/Joint/Cartilage Erosions of the medial head of the proximal phalanx of the great toe and of the medial base of the distal phalanx of the great toe are present along with marrow edema in the distal phalanx and distally in the proximal phalanx. Although gout arthropathy could cause a similar imaging appearance, the appearance is concerning for osteomyelitis given the clinical scenario. This also on appearance of suspected marrow edema in the distal phalanges of the third and fifth toes such that osteomyelitis cannot be excluded in these locations either. Notable arthropathy at the Lisfranc joint especially along the first digit dorsally, with subcortical marrow edema, degenerative subcortical cysts, and notable spurring. No malalignment at the Lisfranc joint. Ligaments The Lisfranc ligament appears intact. Muscles and Tendons Diffuse edema within regional musculature, probably neurogenic given the diffuse appearance. Soft tissues Dorsal subcutaneous edema in the foot tracking into the  toes, nonspecific but cellulitis is not excluded. Edema in the toes is most confluent in the great toe. IMPRESSION: 1. Substantial erosive arthropathy at the interphalangeal joint of the great toe medially, with associated marrow edema in the distal phalanx and distally in the proximal phalanx. Given the swelling in the toe and clinical context, the appearance is suspicious for osteomyelitis, with gout arthropathy being a less likely differential diagnostic consideration. 2. Marrow edema in the distal phalanx small toe could also be from osteomyelitis. Marrow edema in the distal phalanx of the third toe is not as convincing but is still abnormal. 3. Dorsal subcutaneous edema in the foot may be from cellulitis. 4. Diffuse muscular edema probably neurogenic. 5. Arthropathy at the Lisfranc joint especially medially. No Lisfranc joint malalignment, the Lisfranc ligament appears intact. Electronically Signed   By: Van Clines M.D.   On: 02/03/2021 08:19   DG Foot 2 Views Left  Result Date: 02/02/2021 CLINICAL DATA:  Infected wound great toe EXAM: LEFT FOOT - 2 VIEW COMPARISON:  None. FINDINGS: Extensive soft tissue swelling and ulceration of the first digit is seen medial to the interphalangeal joint with subjacent areas of age-indeterminate ulceration on a background of more diffuse spurring. There is a questionable lucency through a osteophyte along the medial base of the first distal phalanx as well which could reflect an acute or cut subacute fracture. Few punctate indeterminate radiodensities are present in the soft tissues, possibly related to erosive changes. Additional degenerative changes including subcortical cystic features about the first interphalangeal joint as well. More diffuse degenerative changes throughout the foot elsewhere are fairly mild-to-moderate. No other acute or conspicuous osseous abnormalities. Bidirectional calcaneal spur. Vascular calcium in the soft tissues. IMPRESSION: Soft  tissue swelling and ulceration along the medial first digit at the level of the interphalangeal joint. There is erosive change about the level of the first interphalangeal joint conspicuous for subjacent osteomyelitis. Questionable lucency through a large spur at the base of the first distal phalanx, could reflect a small fracture line as well. More diffuse mild-to-moderate degenerative changes throughout the foot. Bidirectional calcaneal spurs. Electronically Signed   By: Lovena Le M.D.   On: 02/02/2021 22:41        Scheduled Meds: . [  MAR Hold] gabapentin  300 mg Oral QHS  . [MAR Hold] hydrochlorothiazide  12.5 mg Oral Daily  . [MAR Hold] insulin aspart  0-15 Units Subcutaneous TID AC & HS  . [MAR Hold] insulin starter kit- pen needles  1 kit Other Once  . [MAR Hold] lisinopril  20 mg Oral Daily  . [MAR Hold] senna-docusate  1 tablet Oral BID   Continuous Infusions: .  ceFAZolin (ANCEF) IV    . [MAR Hold] cefTRIAXone (ROCEPHIN)  IV 2 g (02/03/21 0914)  . lactated ringers 10 mL/hr at 02/04/21 0745  . [MAR Hold] metronidazole 500 mg (02/03/21 2348)  . [MAR Hold] vancomycin 1,000 mg (02/03/21 1953)          Aline August, MD [facility] 02/04/2021, 7:57 AM

## 2021-02-04 NOTE — Transfer of Care (Signed)
Immediate Anesthesia Transfer of Care Note  Patient: Brett White  Procedure(s) Performed: LEFT GREAT TOE AMPUTATION (Left )  Patient Location: PACU  Anesthesia Type:General  Level of Consciousness: awake, alert  and oriented  Airway & Oxygen Therapy: Patient Spontanous Breathing and Patient connected to nasal cannula oxygen  Post-op Assessment: Report given to RN and Post -op Vital signs reviewed and stable  Post vital signs: Reviewed and stable  Last Vitals:  Vitals Value Taken Time  BP 121/83 02/04/21 0830  Temp 36.2 C 02/04/21 0830  Pulse 80 02/04/21 0831  Resp 10 02/04/21 0831  SpO2 100 % 02/04/21 0831  Vitals shown include unvalidated device data.  Last Pain:  Vitals:   02/04/21 0616  TempSrc:   PainSc: 4       Patients Stated Pain Goal: 5 (02/04/21 0546)  Complications: No complications documented.

## 2021-02-04 NOTE — Anesthesia Procedure Notes (Signed)
Procedure Name: LMA Insertion Date/Time: 02/04/2021 7:57 AM Performed by: Lelon Perla, CRNA Pre-anesthesia Checklist: Patient identified, Emergency Drugs available, Suction available and Patient being monitored Patient Re-evaluated:Patient Re-evaluated prior to induction Oxygen Delivery Method: Circle System Utilized Preoxygenation: Pre-oxygenation with 100% oxygen Induction Type: IV induction Ventilation: Mask ventilation without difficulty LMA: LMA inserted LMA Size: 5.0 Number of attempts: 1 Airway Equipment and Method: Bite block Placement Confirmation: positive ETCO2 Tube secured with: Tape Dental Injury: Teeth and Oropharynx as per pre-operative assessment

## 2021-02-04 NOTE — Anesthesia Postprocedure Evaluation (Signed)
Anesthesia Post Note  Patient: Brett White  Procedure(s) Performed: LEFT GREAT TOE AMPUTATION (Left )     Patient location during evaluation: PACU Anesthesia Type: General Level of consciousness: awake and alert Pain management: pain level controlled Vital Signs Assessment: post-procedure vital signs reviewed and stable Respiratory status: spontaneous breathing, nonlabored ventilation, respiratory function stable and patient connected to nasal cannula oxygen Cardiovascular status: blood pressure returned to baseline and stable Postop Assessment: no apparent nausea or vomiting Anesthetic complications: no   No complications documented.  Last Vitals:  Vitals:   02/04/21 0953 02/04/21 1224  BP: (!) 144/93 138/85  Pulse: 78 80  Resp: 18 18  Temp: 36.7 C 36.7 C  SpO2: 97% 96%    Last Pain:  Vitals:   02/04/21 1238  TempSrc:   PainSc: 10-Worst pain ever                 Shelton Silvas

## 2021-02-04 NOTE — Plan of Care (Signed)

## 2021-02-04 NOTE — Evaluation (Signed)
Physical Therapy Evaluation Patient Details Name: Brett White MRN: [id number] DOB: Oct 08, 1960 Today's Date: 02/04/2021   History of Present Illness  The pt is a 60 yo male presenting 5/5 due to non healing wound on L toe, is now s/p amputation of L great toe due to osteomyelitis on 5/7. PMH includes:  DM II with nuropathy and prior back surgery.    Clinical Impression  Pt in bed upon arrival of PT, agreeable to evaluation at this time. Prior to admission the pt was completely independent with mobility and ADLs, living alone in an apt with level entry. The pt now presents with limitations in functional mobility, endurance, dynamic stability, and safety awareness that impact his ability to safely mobilize while maintaining NWB LLE and will continue to benefit from skilled PT to address these deficits. The pt was up in his room upon arrival of PT, not using AD, and reporting he had been "hopping" to maintain WB precautions. However, upon mobilizing with PT, the pt required frequent reminders for wb status with sit-stand transfers and gait. He was able to move with good stability with RW, but will continue to benefit from skilled PT to progress activity tolerance as well as independence with application of precautions to all mobility.      Follow Up Recommendations No PT follow up    Equipment Recommendations  Rolling walker with 5" wheels;3in1 (PT)    Recommendations for Other Services       Precautions / Restrictions Precautions Precautions: Fall Restrictions Weight Bearing Restrictions: Yes LLE Weight Bearing: Non weight bearing      Mobility  Bed Mobility Overal bed mobility: Independent                  Transfers Overall transfer level: Needs assistance Equipment used: Rolling walker (2 wheeled) Transfers: Sit to/from Stand Sit to Stand: Supervision         General transfer comment: pt performing on his own prior to arrival of PT, poor maintaining of NWB status of  LLE, even with supervision and multiple cues.  Ambulation/Gait Ambulation/Gait assistance: Supervision Gait Distance (Feet): 35 Feet Assistive device: Rolling walker (2 wheeled)       General Gait Details: hop-to pattern with use of RW, cues for NWB. pt stating he had "hopped" from bed to couch in the room prior to PT arrival and "forgot" his RW. discussed need for safety and use of AD tomaintain NWB      Balance Overall balance assessment: Mild deficits observed, not formally tested                                           Pertinent Vitals/Pain Pain Assessment: Faces Faces Pain Scale: Hurts a little bit Pain Location: pt reporting 10/10 at incision site Pain Descriptors / Indicators: Burning Pain Intervention(s): Limited activity within patient's tolerance;Monitored during session;Repositioned    Home Living Family/patient expects to be discharged to:: Private residence Living Arrangements: Alone Available Help at Discharge: Friend(s);Available PRN/intermittently Type of Home: Apartment Home Access: Level entry     Home Layout: One level Home Equipment: Shower seat      Prior Function Level of Independence: Independent         Comments: independent but limited by pain. 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   Dominant Hand: Right  Extremity/Trunk Assessment   Upper Extremity Assessment Upper Extremity Assessment: Overall WFL for tasks assessed    Lower Extremity Assessment Lower Extremity Assessment: Overall WFL for tasks assessed    Cervical / Trunk Assessment Cervical / Trunk Assessment: Normal  Communication   Communication: No difficulties  Cognition Arousal/Alertness: Awake/alert Behavior During Therapy: Impulsive;WFL for tasks assessed/performed Overall Cognitive Status: Impaired/Different from baseline Area of Impairment: Safety/judgement;Problem solving                          Safety/Judgement: Decreased awareness of safety;Decreased awareness of deficits   Problem Solving: Requires verbal cues General Comments: verbal cues for safety, pt with slightly impulsive behaviors through session, required increased cues to maintain WB precautions      General Comments      Exercises     Assessment/Plan    PT Assessment Patient needs continued PT services  PT Problem List Decreased balance;Decreased mobility;Decreased safety awareness;Decreased knowledge of precautions       PT Treatment Interventions DME instruction;Gait training;Functional mobility training;Therapeutic activities;Therapeutic exercise;Balance training;Cognitive remediation;Patient/family education    PT Goals (Current goals can be found in the Care Plan section)  Acute Rehab PT Goals Patient Stated Goal: return home PT Goal Formulation: With patient Time For Goal Achievement: 02/18/21 Potential to Achieve Goals: Good    Frequency Min 3X/week    AM-PAC PT "6 Clicks" Mobility  Outcome Measure Help needed turning from your back to your side while in a flat bed without using bedrails?: None Help needed moving from lying on your back to sitting on the side of a flat bed without using bedrails?: None Help needed moving to and from a bed to a chair (including a wheelchair)?: A Little Help needed standing up from a chair using your arms (e.g., wheelchair or bedside chair)?: A Little Help needed to walk in hospital room?: A Little Help needed climbing 3-5 steps with a railing? : A Little 6 Click Score: 20    End of Session Equipment Utilized During Treatment: Gait belt Activity Tolerance: Patient tolerated treatment well Patient left: in chair Nurse Communication: Mobility status PT Visit Diagnosis: Other abnormalities of gait and mobility (R26.89)    Time: 9024-0973 PT Time Calculation (min) (ACUTE ONLY): 15 min   Charges:   PT Evaluation $PT Eval Low Complexity: 1 Low           Rolm Baptise, PT, DPT   Acute Rehabilitation Department Pager #: [phone]  Gaetana Michaelis 02/04/2021, 6:10 PM

## 2021-02-04 NOTE — Interval H&P Note (Signed)
History and Physical Interval Note:  02/04/2021 7:44 AM  Brett White  has presented today for surgery, with the diagnosis of Osteomyelitis Left Great Toe.  The various methods of treatment have been discussed with the patient and family. After consideration of risks, benefits and other options for treatment, the patient has consented to  Procedure(s): LEFT GREAT TOE AMPUTATION (Left) as a surgical intervention.  The patient's history has been reviewed, patient examined, no change in status, stable for surgery.  I have reviewed the patient's chart and labs.  Questions were answered to the patient's satisfaction.     [name]

## 2021-02-04 NOTE — Anesthesia Preprocedure Evaluation (Addendum)
Anesthesia Evaluation  Patient identified by MRN, date of birth, ID band Patient awake    Reviewed: Allergy & Precautions, NPO status , Patient's Chart, lab work & pertinent test results  Airway Mallampati: II  TM Distance: >3 FB Neck ROM: Full    Dental  (+) Poor Dentition, Missing, Chipped, Loose, Dental Advisory Given   Pulmonary neg pulmonary ROS,    breath sounds clear to auscultation       Cardiovascular hypertension,  Rhythm:Regular Rate:Normal     Neuro/Psych  Neuromuscular disease negative psych ROS   GI/Hepatic negative GI ROS, Neg liver ROS,   Endo/Other  diabetes  Renal/GU negative Renal ROS     Musculoskeletal negative musculoskeletal ROS (+)   Abdominal Normal abdominal exam  (+)   Peds  Hematology negative hematology ROS (+)   Anesthesia Other Findings   Reproductive/Obstetrics                            Anesthesia Physical Anesthesia Plan  ASA: II  Anesthesia Plan: General   Post-op Pain Management:    Induction: Intravenous  PONV Risk Score and Plan: 3 and Ondansetron, Dexamethasone and Midazolam  Airway Management Planned: LMA  Additional Equipment: None  Intra-op Plan:   Post-operative Plan: Extubation in OR  Informed Consent: I have reviewed the patients History and Physical, chart, labs and discussed the procedure including the risks, benefits and alternatives for the proposed anesthesia with the patient or authorized representative who has indicated his/her understanding and acceptance.     Dental advisory given  Plan Discussed with: CRNA  Anesthesia Plan Comments:        Anesthesia Quick Evaluation

## 2021-02-04 NOTE — Progress Notes (Signed)
Orthopedic Tech Progress Note Patient Details:  Brett White 1961-02-14 [id number]  Ortho Devices Type of Ortho Device: Postop shoe/boot Ortho Device/Splint Location: LLE Ortho Device/Splint Interventions: Ordered   Post Interventions Patient Tolerated: Other (comment) Instructions Provided: Other (comment)   [name] 02/04/2021, 10:46 AM

## 2021-02-04 NOTE — Plan of Care (Signed)
Pain controlled with scheduled medications. Patient states pain 10/10 at all times. Appears comfortable.   Problem: Education: Goal: Knowledge of General Education information will improve Description: Including pain rating scale, medication(s)/side effects and non-pharmacologic comfort measures Outcome: Progressing   Problem: Health Behavior/Discharge Planning: Goal: Ability to manage health-related needs will improve Outcome: Progressing   Problem: Clinical Measurements: Goal: Ability to maintain clinical measurements within normal limits will improve Outcome: Progressing   Problem: Activity: Goal: Risk for activity intolerance will decrease Outcome: Progressing   Problem: Coping: Goal: Level of anxiety will decrease Outcome: Progressing   Problem: Elimination: Goal: Will not experience complications related to bowel motility Outcome: Progressing   Problem: Pain Managment: Goal: General experience of comfort will improve Outcome: Progressing   Problem: Skin Integrity: Goal: Risk for impaired skin integrity will decrease Outcome: Progressing

## 2021-02-04 NOTE — Op Note (Signed)
02/04/2021  8:28 AM  PATIENT:  Brett White    PRE-OPERATIVE DIAGNOSIS:  Osteomyelitis Left Great Toe  POST-OPERATIVE DIAGNOSIS:  Same  PROCEDURE:  LEFT GREAT TOE AMPUTATION  SURGEON:  [name], MD  PHYSICIAN ASSISTANT:None ANESTHESIA:   General  PREOPERATIVE INDICATIONS:  Guled Gahan is a  60 y.o. male with a diagnosis of Osteomyelitis Left Great Toe who failed conservative measures and elected for surgical management.    The risks benefits and alternatives were discussed with the patient preoperatively including but not limited to the risks of infection, bleeding, nerve injury, cardiopulmonary complications, the need for revision surgery, among others, and the patient was willing to proceed.  OPERATIVE IMPLANTS: None  @ENCIMAGES @  OPERATIVE FINDINGS: Margins clear vessels calcified  OPERATIVE PROCEDURE: Patient is brought the operating room and underwent LMA anesthetic.  After adequate levels anesthesia were obtained patient's left lower extremity was prepped using DuraPrep draped into a sterile field a timeout was called.  A fishmouth incision was made distal to the MTP joint.  The toe was amputated through the MTP joint the margins were clear there was no signs of infection.  Patient had calcified digital vessels.  The wound was irrigated with 1 L normal saline.  The incision was closed using 2-0 nylon.  Sterile dressing was applied patient was extubated patient taken the PACU in stable condition   DISCHARGE PLANNING:  Antibiotic duration: Continue antibiotics for 24 hours postoperatively  Weightbearing: Touchdown weightbearing on the left  Pain medication: Opioid pathway  Dressing care/ Wound VAC: Dry dressing reinforce as needed  Ambulatory devices: Walker or crutches  Discharge to: Home.  Follow-up: In the office 1 week post operative.

## 2021-02-05 ENCOUNTER — Encounter (HOSPITAL_COMMUNITY): Payer: Self-pay | Admitting: Orthopedic Surgery

## 2021-02-05 DIAGNOSIS — I10 Essential (primary) hypertension: Secondary | ICD-10-CM | POA: Diagnosis not present

## 2021-02-05 DIAGNOSIS — L03032 Cellulitis of left toe: Secondary | ICD-10-CM | POA: Diagnosis not present

## 2021-02-05 DIAGNOSIS — M869 Osteomyelitis, unspecified: Secondary | ICD-10-CM | POA: Diagnosis not present

## 2021-02-05 DIAGNOSIS — E1142 Type 2 diabetes mellitus with diabetic polyneuropathy: Secondary | ICD-10-CM | POA: Diagnosis not present

## 2021-02-05 LAB — CBC
HCT: 38.7 % — ABNORMAL LOW (ref 39.0–52.0)
Hemoglobin: 12.9 g/dL — ABNORMAL LOW (ref 13.0–17.0)
MCH: 29.2 pg (ref 26.0–34.0)
MCHC: 33.3 g/dL (ref 30.0–36.0)
MCV: 87.6 fL (ref 80.0–100.0)
Platelets: 358 10*3/uL (ref 150–400)
RBC: 4.42 MIL/uL (ref 4.22–5.81)
RDW: 12 % (ref 11.5–15.5)
WBC: 10.1 10*3/uL (ref 4.0–10.5)
nRBC: 0 % (ref 0.0–0.2)

## 2021-02-05 LAB — GLUCOSE, CAPILLARY
Glucose-Capillary: 227 mg/dL — ABNORMAL HIGH (ref 70–99)
Glucose-Capillary: 155 mg/dL — ABNORMAL HIGH (ref 70–99)
Glucose-Capillary: 143 mg/dL — ABNORMAL HIGH (ref 70–99)
Glucose-Capillary: 210 mg/dL — ABNORMAL HIGH (ref 70–99)

## 2021-02-05 LAB — BASIC METABOLIC PANEL
Anion gap: 8 (ref 5–15)
BUN: 26 mg/dL — ABNORMAL HIGH (ref 6–20)
CO2: 25 mmol/L (ref 22–32)
Calcium: 8.8 mg/dL — ABNORMAL LOW (ref 8.9–10.3)
Chloride: 100 mmol/L (ref 98–111)
Creatinine, Ser: 1.11 mg/dL (ref 0.61–1.24)
GFR, Estimated: >60 mL/min (ref >60)
Glucose, Bld: 219 mg/dL — ABNORMAL HIGH (ref 70–99)
Potassium: 4.1 mmol/L (ref 3.5–5.1)
Sodium: 133 mmol/L — ABNORMAL LOW (ref 135–145)

## 2021-02-05 LAB — MAGNESIUM: Magnesium: 1.9 mg/dL (ref 1.7–2.4)

## 2021-02-05 LAB — C-REACTIVE PROTEIN: CRP: 0.8 mg/dL (ref <1.0)

## 2021-02-05 MED ORDER — HYDROMORPHONE HCL 1 MG/ML IJ SOLN
1.0000 mg | INTRAMUSCULAR | Status: DC | PRN
  Administered 2021-02-05 – 2021-02-13 (×43): 1 mg via INTRAVENOUS
  Filled 2021-02-05 (×44): qty 1

## 2021-02-05 MED ORDER — INSULIN GLARGINE 100 UNIT/ML ~~LOC~~ SOLN
15.0000 [IU] | Freq: Every day | SUBCUTANEOUS | Status: DC
  Administered 2021-02-05 – 2021-02-07 (×3): 15 [IU] via SUBCUTANEOUS
  Filled 2021-02-05 (×4): qty 0.15

## 2021-02-05 NOTE — Progress Notes (Signed)
Patient ID: Brett White, male   DOB: 02-09-1961, 60 y.o.   MRN: [id number] Patient is postoperative day 1 left great toe amputation.  The dressing is clean and dry.  Patient complains of throbbing burning pain.  Discussed the importance of elevation continuing with pain medication possible discharge tomorrow once pain is under better control.

## 2021-02-05 NOTE — Progress Notes (Signed)
Patient ID: Brett White, male   DOB: Jul 21, 1961, 60 y.o.   MRN: [id number]  PROGRESS NOTE    Brett White  KCM:[id number] DOB: 1961-08-24 DOA: 02/02/2021 PCP: Patient, [name]   Brief Narrative:  60 year old male with history of hypertension, diabetes mellitus type 2, previous unspecified CVA in 2015, diabetic neuropathy presented with worsening left foot pain.  On presentation, left foot x-ray showed findings concerning for erosions of the first interphalangeal joint concerning for osteomyelitis.  He was started on broad-spectrum antibiotics.  He underwent left great toe amputation on 02/04/2021 by orthopedics.  Assessment & Plan:   Acute osteomyelitis of great toe of left foot with surrounding cellulitis -X-ray of the foot reveals bony destruction of the first interphalangeal joint concerning for osteomyelitis. -Blood cultures negative so far -Orthopedics following: Status post left great toe amputation on 02/04/2021 by orthopedics.  Wound care as per orthopedics recommendations.  Antibiotics will be discontinued today as per orthopedics recommendations. -Still complains of severe lower extremity pain requiring IV pain medications.  Will monitor in the hospital for 1 more day.  Diabetes mellitus type 2 uncontrolled with hyperglycemia -Continue CBGs with SSI.  Increase long-acting insulin to 15 units nightly.  Hypertension -Not taking any antihypertensives for months.  Monitor blood pressure.  Continue hydrochlorothiazide and lisinopril  Diabetic neuropathy -Continue gabapentin  Mild hyponatremia -Monitor.  Treated with IV fluids   DVT prophylaxis: SCDs Code Status: Full Family Communication: None at bedside Disposition Plan: Status is: Inpatient because of severity of illness requiring IV antibiotics and surgical intervention  Dispo: The patient is from: Home              Anticipated d/c is to: Home in 1 to 2 days if cleared by orthopedics and pain improves.            Patient currently is not medically stable to d/c.   Difficult to place patient No   Consultants: Orthopedics  Procedures: Left great toe amputation on 02/04/2021  Antimicrobials:  Anti-infectives (From admission, onward)   Start     Dose/Rate Route Frequency Ordered Stop   02/04/21 1600  ceFAZolin (ANCEF) IVPB 2g/100 mL premix        2 g 200 mL/hr over 30 Minutes Intravenous Every 8 hours 02/04/21 0952 02/05/21 0020   02/04/21 0800  ceFAZolin (ANCEF) IVPB 2g/100 mL premix  Status:  Discontinued        2 g 200 mL/hr over 30 Minutes Intravenous To ShortStay Surgical 02/03/21 1206 02/04/21 0949   02/03/21 2000  vancomycin (VANCOREADY) IVPB 1000 mg/200 mL        1,000 mg 200 mL/hr over 60 Minutes Intravenous Every 12 hours 02/02/21 2327 02/05/21 1036   02/03/21 0800  cefTRIAXone (ROCEPHIN) 2 g in sodium chloride 0.9 % 100 mL IVPB        2 g 200 mL/hr over 30 Minutes Intravenous Every 24 hours 02/03/21 0159 02/06/21 0759   02/03/21 0800  metroNIDAZOLE (FLAGYL) IVPB 500 mg        500 mg 100 mL/hr over 60 Minutes Intravenous Every 8 hours 02/03/21 0752 02/04/21 2359   02/02/21 2230  piperacillin-tazobactam (ZOSYN) IVPB 3.375 g        3.375 g 100 mL/hr over 30 Minutes Intravenous  Once 02/02/21 2224 02/03/21 0205   02/02/21 2230  vancomycin (VANCOREADY) IVPB 1750 mg/350 mL        1,750 mg 175 mL/hr over 120 Minutes Intravenous  Once 02/02/21 2224 02/03/21 0835  Subjective: Patient seen and examined at bedside.  Still complains of severe intermittent left foot pain.  No fever, vomiting, chest pain or worsening shortness breath reported. Objective: Vitals:   02/04/21 1224 02/04/21 2011 02/04/21 2347 02/05/21 0451  BP: 138/85 140/84 133/84 114/77  Pulse: 80 77 77 67  Resp: 18 16  17   Temp: 98 F (36.7 C) 98 F (36.7 C) 98 F (36.7 C) (!) 97.5 F (36.4 C)  TempSrc:  Oral Oral Oral  SpO2: 96% 98% 97% 98%  Weight:      Height:        Intake/Output Summary (Last 24 hours)  at 02/05/2021 1042 Last data filed at 02/05/2021 0500 Gross per 24 hour  Intake 616.81 ml  Output 1200 ml  Net -583.19 ml   Filed Weights   02/02/21 2200  Weight: 97.5 kg    Examination:  General exam: On room air currently.  No distress. Respiratory system: Bilateral decreased breath sounds at bases  cardiovascular system: S1-S2 heard, rate controlled  gastrointestinal system: Abdomen is mildly distended, soft and nontender.  Normal bowel sounds heard  extremities: Left foot dressing present.  Mild lower extremity edema  Data Reviewed: I have personally reviewed following labs and imaging studies  CBC: Recent Labs  Lab 02/02/21 2220 02/03/21 0217 02/04/21 0114 02/05/21 0210  WBC 8.2 7.7 5.1 10.1  NEUTROABS 6.2 4.9 2.9  --   HGB 13.4 12.9* 11.7* 12.9*  HCT 41.3 39.9 35.3* 38.7*  MCV 88.8 89.1 87.6 87.6  PLT 335 312 283 291   Basic Metabolic Panel: Recent Labs  Lab 02/02/21 2220 02/03/21 0217 02/04/21 0114 02/05/21 0210  NA 135 134* 136 133*  K 3.7 3.8 3.9 4.1  CL 100 103 102 100  CO2 25 24 27 25   GLUCOSE 311* 259* 141* 219*  BUN 20 21* 26* 26*  CREATININE 1.38* 1.32* 1.13 1.11  CALCIUM 8.9 8.8* 8.6* 8.8*  MG  --  2.1 2.1 1.9   GFR: Estimated Creatinine Clearance: 87.8 mL/min (by C-G formula based on SCr of 1.11 mg/dL). Liver Function Tests: Recent Labs  Lab 02/02/21 2220 02/03/21 0217  AST 16 12*  ALT 13 9  ALKPHOS 82 75  BILITOT 0.6 0.7  PROT 7.7 7.2  ALBUMIN 3.7 3.5   No results for input(s): LIPASE, AMYLASE in the last 168 hours. No results for input(s): AMMONIA in the last 168 hours. Coagulation Profile: Recent Labs  Lab 02/02/21 2220  INR 1.0   Cardiac Enzymes: No results for input(s): CKTOTAL, CKMB, CKMBINDEX, TROPONINI in the last 168 hours. BNP (last 3 results) No results for input(s): PROBNP in the last 8760 hours. HbA1C: Recent Labs    02/03/21 0217  HGBA1C 11.5*   CBG: Recent Labs  Lab 02/04/21 0831 02/04/21 1220  02/04/21 1716 02/04/21 2013 02/05/21 0651  GLUCAP 213* 208* 255* 314* 227*   Lipid Profile: No results for input(s): CHOL, HDL, LDLCALC, TRIG, CHOLHDL, LDLDIRECT in the last 72 hours. Thyroid Function Tests: No results for input(s): TSH, T4TOTAL, FREET4, T3FREE, THYROIDAB in the last 72 hours. Anemia Panel: No results for input(s): VITAMINB12, FOLATE, FERRITIN, TIBC, IRON, RETICCTPCT in the last 72 hours. Sepsis Labs: Recent Labs  Lab 02/02/21 2220 02/03/21 0201  LATICACIDVEN 2.5* 1.0    Recent Results (from the past 240 hour(s))  Culture, blood (Routine x 2)     Status: None (Preliminary result)   Collection Time: 02/02/21 10:17 PM   Specimen: BLOOD RIGHT HAND  Result Value Ref Range  Status   Specimen Description BLOOD RIGHT HAND  Final   Special Requests AEROBIC BOTTLE ONLY Blood Culture adequate volume  Final   Culture   Final    NO GROWTH 3 DAYS Performed at [facility], [address], [place], [place] [address]    Report Status PENDING  Incomplete  Culture, blood (Routine x 2)     Status: None (Preliminary result)   Collection Time: 02/02/21 10:20 PM   Specimen: BLOOD LEFT HAND  Result Value Ref Range Status   Specimen Description BLOOD LEFT HAND  Final   Special Requests   Final    BOTTLES DRAWN AEROBIC AND ANAEROBIC Blood Culture adequate volume   Culture   Final    NO GROWTH 3 DAYS Performed at [facility], [place] [address], [place], [place] [address]    Report Status PENDING  Incomplete  Resp Panel by RT-PCR (Flu A&B, Covid) Nasopharyngeal Swab     Status: None   Collection Time: 02/03/21 12:26 PM   Specimen: Nasopharyngeal Swab; Nasopharyngeal(NP) swabs in vial transport medium  Result Value Ref Range Status   SARS Coronavirus 2 by RT PCR NEGATIVE NEGATIVE Final    Comment: (NOTE) SARS-CoV-2 target nucleic acids are NOT DETECTED.  The SARS-CoV-2 RNA is generally detectable in upper respiratory specimens during the acute phase of infection.  The lowest concentration of SARS-CoV-2 viral copies this assay can detect is 138 copies/mL. A negative result does not preclude SARS-Cov-2 infection and should not be used as the sole basis for treatment or other patient management decisions. A negative result may occur with  improper specimen collection/handling, submission of specimen other than nasopharyngeal swab, presence of viral mutation(s) within the areas targeted by this assay, and inadequate number of viral copies(<138 copies/mL). A negative result must be combined with clinical observations, patient history, and epidemiological information. The expected result is Negative.  Fact Sheet for Patients:  EntrepreneurPulse.com.au  Fact Sheet for Healthcare Providers:  IncredibleEmployment.be  This test is no t yet approved or cleared by the [place] FDA and  has been authorized for detection and/or diagnosis of SARS-CoV-2 by FDA under an Emergency Use Authorization (EUA). This EUA will remain  in effect (meaning this test can be used) for the duration of the COVID-19 declaration under Section 564(b)(1) of the Act, 21 U.S.C.section 360bbb-3(b)(1), unless the authorization is terminated  or revoked sooner.       Influenza A by PCR NEGATIVE NEGATIVE Final   Influenza B by PCR NEGATIVE NEGATIVE Final    Comment: (NOTE) The Xpert Xpress SARS-CoV-2/FLU/RSV plus assay is intended as an aid in the diagnosis of influenza from Nasopharyngeal swab specimens and should not be used as a sole basis for treatment. Nasal washings and aspirates are unacceptable for Xpert Xpress SARS-CoV-2/FLU/RSV testing.  Fact Sheet for Patients: EntrepreneurPulse.com.au  Fact Sheet for Healthcare Providers: IncredibleEmployment.be  This test is not yet approved or cleared by the [place] FDA and has been authorized for detection and/or diagnosis of SARS-CoV-2 by FDA  under an Emergency Use Authorization (EUA). This EUA will remain in effect (meaning this test can be used) for the duration of the COVID-19 declaration under Section 564(b)(1) of the Act, 21 U.S.C. section 360bbb-3(b)(1), unless the authorization is terminated or revoked.  Performed at [facility], [place] [address] [place],  [address]          Radiology Studies: No results found.      Scheduled Meds: . acidophilus  1 capsule Oral Daily  . vitamin C  1,000 mg Oral Daily  . docusate sodium  100 mg Oral Daily  . enoxaparin (LOVENOX) injection  40 mg Subcutaneous Q24H  . gabapentin  300 mg Oral QHS  . hydrochlorothiazide  12.5 mg Oral Daily  . insulin aspart  0-15 Units Subcutaneous TID AC & HS  . insulin glargine  15 Units Subcutaneous QHS  . insulin starter kit- pen needles  1 kit Other Once  . lisinopril  20 mg Oral Daily  . nutrition supplement (JUVEN)  1 packet Oral BID BM  . pantoprazole  40 mg Oral Daily  . senna-docusate  1 tablet Oral BID  . zinc sulfate  220 mg Oral Daily   Continuous Infusions: . sodium chloride 75 mL/hr at 02/04/21 1528  . cefTRIAXone (ROCEPHIN)  IV 2 g (02/05/21 0844)  . magnesium sulfate bolus IVPB            Aline August, MD [facility] 02/05/2021, 10:42 AM

## 2021-02-05 NOTE — Progress Notes (Signed)
Pain continues to be an issue remains a 10 out of 10- described as burning-throbbing pain

## 2021-02-06 DIAGNOSIS — M869 Osteomyelitis, unspecified: Secondary | ICD-10-CM | POA: Diagnosis not present

## 2021-02-06 DIAGNOSIS — E1142 Type 2 diabetes mellitus with diabetic polyneuropathy: Secondary | ICD-10-CM | POA: Diagnosis not present

## 2021-02-06 DIAGNOSIS — L03032 Cellulitis of left toe: Secondary | ICD-10-CM | POA: Diagnosis not present

## 2021-02-06 DIAGNOSIS — I10 Essential (primary) hypertension: Secondary | ICD-10-CM | POA: Diagnosis not present

## 2021-02-06 LAB — CBC
HCT: 34.5 % — ABNORMAL LOW (ref 39.0–52.0)
Hemoglobin: 11.4 g/dL — ABNORMAL LOW (ref 13.0–17.0)
MCH: 29 pg (ref 26.0–34.0)
MCHC: 33 g/dL (ref 30.0–36.0)
MCV: 87.8 fL (ref 80.0–100.0)
Platelets: 274 10*3/uL (ref 150–400)
RBC: 3.93 MIL/uL — ABNORMAL LOW (ref 4.22–5.81)
RDW: 12 % (ref 11.5–15.5)
WBC: 6.4 10*3/uL (ref 4.0–10.5)
nRBC: 0 % (ref 0.0–0.2)

## 2021-02-06 LAB — BASIC METABOLIC PANEL
Anion gap: 8 (ref 5–15)
BUN: 19 mg/dL (ref 6–20)
CO2: 25 mmol/L (ref 22–32)
Calcium: 8.5 mg/dL — ABNORMAL LOW (ref 8.9–10.3)
Chloride: 101 mmol/L (ref 98–111)
Creatinine, Ser: 0.92 mg/dL (ref 0.61–1.24)
GFR, Estimated: >60 mL/min (ref >60)
Glucose, Bld: 142 mg/dL — ABNORMAL HIGH (ref 70–99)
Potassium: 4.1 mmol/L (ref 3.5–5.1)
Sodium: 134 mmol/L — ABNORMAL LOW (ref 135–145)

## 2021-02-06 LAB — GLUCOSE, CAPILLARY
Glucose-Capillary: 195 mg/dL — ABNORMAL HIGH (ref 70–99)
Glucose-Capillary: 211 mg/dL — ABNORMAL HIGH (ref 70–99)
Glucose-Capillary: 150 mg/dL — ABNORMAL HIGH (ref 70–99)
Glucose-Capillary: 191 mg/dL — ABNORMAL HIGH (ref 70–99)

## 2021-02-06 NOTE — Progress Notes (Signed)
Patient ID: Brett White, male   DOB: Dec 15, 1960, 60 y.o.   MRN: [id number] Patient is status post left great toe amputation.  Patient states he still has throbbing pain.  The outside of the [place] wrap was removed to see if this will help relieve the throbbing pain.  Plan for therapy today with a walker.  Plan for discharge to home once he is safe with therapy.

## 2021-02-06 NOTE — Progress Notes (Signed)
Patient ID: Brett White, male   DOB: 1961-08-06, 60 y.o.   MRN: [id number]  PROGRESS NOTE    Brett White  DTO:[id number] DOB: [birth year] DOA: 02/02/2021 PCP: Patient, [name]   Brief Narrative:  60 year old male with history of hypertension, diabetes mellitus type 2, previous unspecified CVA in 2015, diabetic neuropathy presented with worsening left foot pain.  On presentation, left foot x-ray showed findings concerning for erosions of the first interphalangeal joint concerning for osteomyelitis.  He was started on broad-spectrum antibiotics.  He underwent left great toe amputation on 02/04/2021 by orthopedics.  Assessment & Plan:   Acute osteomyelitis of great toe of left foot with surrounding cellulitis -X-ray of the foot reveals bony destruction of the first interphalangeal joint concerning for osteomyelitis. -Blood cultures negative so far -Orthopedics following: Status post left great toe amputation on 02/04/2021 by orthopedics.  Wound care as per orthopedics recommendations.  Antibiotics discontinued on 02/05/21 as per orthopedics recommendations. -Still complains of severe lower extremity pain requiring IV pain medications.  Will monitor in the hospital for 1 more day.  Diabetes mellitus type 2 uncontrolled with hyperglycemia -Continue CBGs with SSI.  Continue lantus 15 units nightly.  Hypertension -Not taking any antihypertensives for months.  Monitor blood pressure.  Continue hydrochlorothiazide and lisinopril  Diabetic neuropathy -Continue gabapentin  Mild hyponatremia -Monitor.  Treated with IV fluids   DVT prophylaxis: SCDs Code Status: Full Family Communication: None at bedside Disposition Plan: Status is: Inpatient because of severity of illness requiring IV antibiotics and surgical intervention  Dispo: The patient is from: Home              Anticipated d/c is to: Home in 1 to 2 days if cleared by orthopedics and pain improves.           Patient  currently is not medically stable to d/c.   Difficult to place patient No   Consultants: Orthopedics  Procedures: Left great toe amputation on 02/04/2021  Antimicrobials:  Anti-infectives (From admission, onward)   Start     Dose/Rate Route Frequency Ordered Stop   02/04/21 1600  ceFAZolin (ANCEF) IVPB 2g/100 mL premix        2 g 200 mL/hr over 30 Minutes Intravenous Every 8 hours 02/04/21 0952 02/05/21 0020   02/04/21 0800  ceFAZolin (ANCEF) IVPB 2g/100 mL premix  Status:  Discontinued        2 g 200 mL/hr over 30 Minutes Intravenous To ShortStay Surgical 02/03/21 1206 02/04/21 0949   02/03/21 2000  vancomycin (VANCOREADY) IVPB 1000 mg/200 mL        1,000 mg 200 mL/hr over 60 Minutes Intravenous Every 12 hours 02/02/21 2327 02/05/21 1036   02/03/21 0800  cefTRIAXone (ROCEPHIN) 2 g in sodium chloride 0.9 % 100 mL IVPB        2 g 200 mL/hr over 30 Minutes Intravenous Every 24 hours 02/03/21 0159 02/06/21 0759   02/03/21 0800  metroNIDAZOLE (FLAGYL) IVPB 500 mg        500 mg 100 mL/hr over 60 Minutes Intravenous Every 8 hours 02/03/21 0752 02/04/21 2359   02/02/21 2230  piperacillin-tazobactam (ZOSYN) IVPB 3.375 g        3.375 g 100 mL/hr over 30 Minutes Intravenous  Once 02/02/21 2224 02/03/21 0205   02/02/21 2230  vancomycin (VANCOREADY) IVPB 1750 mg/350 mL        1,750 mg 175 mL/hr over 120 Minutes Intravenous  Once 02/02/21 2224 02/03/21 0835  Subjective: Patient seen and examined at bedside. Feels miserable. Still complains of severe throbbing intermittent left foot pain.  No vomiting or fever reported.  Objective: Vitals:   02/06/21 0008 02/06/21 0511 02/06/21 0823 02/06/21 0930  BP: 133/89 125/73 (!) 146/89 (!) 149/90  Pulse: 73 67 71 77  Resp: _0 Temp: 98.3 F (36.8 C) 98.4 F (36.9 C)    TempSrc: Oral Oral    SpO2: 95% 98% 98% 98%  Weight:      Height:        Intake/Output Summary (Last 24 hours) at 02/06/2021 1054 Last data filed at 02/06/2021  0900 Gross per 24 hour  Intake 240 ml  Output 500 ml  Net -260 ml   Filed Weights   02/02/21 2200  Weight: 97.5 kg    Examination:  General exam: No acute distress.  Currently on room air.   Respiratory system: Decreased breath sounds at bases bilaterally cardiovascular system: Rate controlled, S1-S2 heard gastrointestinal system: Abdomen is distended mildly, soft and nontender.  Bowel sounds are heard  extremities: Mild lower extremity edema present; left foot has a dressing  Data Reviewed: I have personally reviewed following labs and imaging studies  CBC: Recent Labs  Lab 02/02/21 2220 02/03/21 0217 02/04/21 0114 02/05/21 0210 02/06/21 0230  WBC 8.2 7.7 5.1 10.1 6.4  NEUTROABS 6.2 4.9 2.9  --   --   HGB 13.4 12.9* 11.7* 12.9* 11.4*  HCT 41.3 39.9 35.3* 38.7* 34.5*  MCV 88.8 89.1 87.6 87.6 87.8  PLT 335 312 283 358 814   Basic Metabolic Panel: Recent Labs  Lab 02/02/21 2220 02/03/21 0217 02/04/21 0114 02/05/21 0210 02/06/21 0230  NA 135 134* 136 133* 134*  K 3.7 3.8 3.9 4.1 4.1  CL 100 103 102 100 101  CO2 _1 GLUCOSE 311* 259* 141* 219* 142*  BUN 20 21* 26* 26* 19  CREATININE 1.38* 1.32* 1.13 1.11 0.92  CALCIUM 8.9 8.8* 8.6* 8.8* 8.5*  MG  --  2.1 2.1 1.9  --    GFR: Estimated Creatinine Clearance: 105.9 mL/min (by C-G formula based on SCr of 0.92 mg/dL). Liver Function Tests: Recent Labs  Lab 02/02/21 2220 02/03/21 0217  AST 16 12*  ALT 13 9  ALKPHOS 82 75  BILITOT 0.6 0.7  PROT 7.7 7.2  ALBUMIN 3.7 3.5   No results for input(s): LIPASE, AMYLASE in the last 168 hours. No results for input(s): AMMONIA in the last 168 hours. Coagulation Profile: Recent Labs  Lab 02/02/21 2220  INR 1.0   Cardiac Enzymes: No results for input(s): CKTOTAL, CKMB, CKMBINDEX, TROPONINI in the last 168 hours. BNP (last 3 results) No results for input(s): PROBNP in the last 8760 hours. HbA1C: No results for input(s): HGBA1C in the last 72  hours. CBG: Recent Labs  Lab 02/05/21 0651 02/05/21 1144 02/05/21 1651 02/05/21 2110 02/06/21 0931  GLUCAP 227* 155* 143* 210* 195*   Lipid Profile: No results for input(s): CHOL, HDL, LDLCALC, TRIG, CHOLHDL, LDLDIRECT in the last 72 hours. Thyroid Function Tests: No results for input(s): TSH, T4TOTAL, FREET4, T3FREE, THYROIDAB in the last 72 hours. Anemia Panel: No results for input(s): VITAMINB12, FOLATE, FERRITIN, TIBC, IRON, RETICCTPCT in the last 72 hours. Sepsis Labs: Recent Labs  Lab 02/02/21 2220 02/03/21 0201  LATICACIDVEN 2.5* 1.0    Recent Results (from the past 240 hour(s))  Culture, blood (Routine x 2)     Status: None (Preliminary result)  Collection Time: 02/02/21 10:17 PM   Specimen: BLOOD RIGHT HAND  Result Value Ref Range Status   Specimen Description BLOOD RIGHT HAND  Final   Special Requests AEROBIC BOTTLE ONLY Blood Culture adequate volume  Final   Culture   Final    NO GROWTH 4 DAYS Performed at [facility], [address], [place], [place] [address]    Report Status PENDING  Incomplete  Culture, blood (Routine x 2)     Status: None (Preliminary result)   Collection Time: 02/02/21 10:20 PM   Specimen: BLOOD LEFT HAND  Result Value Ref Range Status   Specimen Description BLOOD LEFT HAND  Final   Special Requests   Final    BOTTLES DRAWN AEROBIC AND ANAEROBIC Blood Culture adequate volume   Culture   Final    NO GROWTH 4 DAYS Performed at [facility], [place] [address], [place], [place] [address]    Report Status PENDING  Incomplete  Resp Panel by RT-PCR (Flu A&B, Covid) Nasopharyngeal Swab     Status: None   Collection Time: 02/03/21 12:26 PM   Specimen: Nasopharyngeal Swab; Nasopharyngeal(NP) swabs in vial transport medium  Result Value Ref Range Status   SARS Coronavirus 2 by RT PCR NEGATIVE NEGATIVE Final    Comment: (NOTE) SARS-CoV-2 target nucleic acids are NOT DETECTED.  The SARS-CoV-2 RNA is generally detectable in upper  respiratory specimens during the acute phase of infection. The lowest concentration of SARS-CoV-2 viral copies this assay can detect is 138 copies/mL. A negative result does not preclude SARS-Cov-2 infection and should not be used as the sole basis for treatment or other patient management decisions. A negative result may occur with  improper specimen collection/handling, submission of specimen other than nasopharyngeal swab, presence of viral mutation(s) within the areas targeted by this assay, and inadequate number of viral copies(<138 copies/mL). A negative result must be combined with clinical observations, patient history, and epidemiological information. The expected result is Negative.  Fact Sheet for Patients:  EntrepreneurPulse.com.au  Fact Sheet for Healthcare Providers:  IncredibleEmployment.be  This test is no t yet approved or cleared by the [place] FDA and  has been authorized for detection and/or diagnosis of SARS-CoV-2 by FDA under an Emergency Use Authorization (EUA). This EUA will remain  in effect (meaning this test can be used) for the duration of the COVID-19 declaration under Section 564(b)(1) of the Act, 21 U.S.C.section 360bbb-3(b)(1), unless the authorization is terminated  or revoked sooner.       Influenza A by PCR NEGATIVE NEGATIVE Final   Influenza B by PCR NEGATIVE NEGATIVE Final    Comment: (NOTE) The Xpert Xpress SARS-CoV-2/FLU/RSV plus assay is intended as an aid in the diagnosis of influenza from Nasopharyngeal swab specimens and should not be used as a sole basis for treatment. Nasal washings and aspirates are unacceptable for Xpert Xpress SARS-CoV-2/FLU/RSV testing.  Fact Sheet for Patients: EntrepreneurPulse.com.au  Fact Sheet for Healthcare Providers: IncredibleEmployment.be  This test is not yet approved or cleared by the [place] FDA and has been  authorized for detection and/or diagnosis of SARS-CoV-2 by FDA under an Emergency Use Authorization (EUA). This EUA will remain in effect (meaning this test can be used) for the duration of the COVID-19 declaration under Section 564(b)(1) of the Act, 21 U.S.C. section 360bbb-3(b)(1), unless the authorization is terminated or revoked.  Performed at [facility], [place] [address] [place], [place] [address]  Radiology Studies: No results found.      Scheduled Meds: . acidophilus  1 capsule Oral Daily  . vitamin C  1,000 mg Oral Daily  . docusate sodium  100 mg Oral Daily  . enoxaparin (LOVENOX) injection  40 mg Subcutaneous Q24H  . gabapentin  300 mg Oral QHS  . hydrochlorothiazide  12.5 mg Oral Daily  . insulin aspart  0-15 Units Subcutaneous TID AC & HS  . insulin glargine  15 Units Subcutaneous QHS  . insulin starter kit- pen needles  1 kit Other Once  . lisinopril  20 mg Oral Daily  . nutrition supplement (JUVEN)  1 packet Oral BID BM  . pantoprazole  40 mg Oral Daily  . senna-docusate  1 tablet Oral BID  . zinc sulfate  220 mg Oral Daily   Continuous Infusions: . magnesium sulfate bolus IVPB            Aline August, MD [facility] 02/06/2021, 10:54 AM

## 2021-02-06 NOTE — Progress Notes (Signed)
Physical Therapy Treatment Patient Details Name: Brett White MRN: [id number] DOB: 06-18-1961 Today's Date: 02/06/2021    History of Present Illness The pt is a 59 yo male presenting 5/5 due to non healing wound on L toe, is now s/p amputation of L great toe due to osteomyelitis on 5/7. PMH includes:  DM II with nuropathy and prior back surgery.    PT Comments    Patient received in bed, states "I've been better". Reports 10/10 pain in left great toe surgical site. He is independent with bed mobility and transfers. Poor adhereance to NWB on left with gait training. Despite cues patient ambulates with weight bearing through heel. Patient moves well but is pain limited. He will continue to benefit from skilled PT while here to improve mobility and safety. Educated patient on keeping as much weight off left foot as he is able to promote healing and prevent further infection.  Patient verbalized understanding.         Follow Up Recommendations  No PT follow up     Equipment Recommendations  Rolling walker with 5" wheels;3in1 (PT)    Recommendations for Other Services       Precautions / Restrictions Restrictions Weight Bearing Restrictions: Yes LLE Weight Bearing: Non weight bearing    Mobility  Bed Mobility Overal bed mobility: Independent                  Transfers Overall transfer level: Independent   Transfers: Sit to/from Stand Sit to Stand: Independent         General transfer comment: poor adherance to NWB on left foot. Despite cues to hop  Ambulation/Gait Ambulation/Gait assistance: Supervision Gait Distance (Feet): 25 Feet Assistive device: Rolling walker (2 wheeled) Gait Pattern/deviations: Step-to pattern Gait velocity: decr   General Gait Details: heel weight bearing on left with RW and supervision. When asked about mobility patient states he is "hopping", but then does not hop once up.   Stairs             Wheelchair Mobility     Modified Rankin (Stroke Patients Only)       Balance Overall balance assessment: Modified Independent                                          Cognition Arousal/Alertness: Awake/alert Behavior During Therapy: WFL for tasks assessed/performed Overall Cognitive Status: Impaired/Different from baseline Area of Impairment: Safety/judgement;Problem solving                         Safety/Judgement: Decreased awareness of safety;Decreased awareness of deficits   Problem Solving: Requires verbal cues General Comments: Patient requires cues for NWB status and is non-compliant. Mainly ambulates with left heel weight bearing      Exercises      General Comments        Pertinent Vitals/Pain Pain Assessment: 0-10 Pain Score: 10-Worst pain ever Pain Descriptors / Indicators: Burning Pain Intervention(s): Monitored during session;Patient requesting pain meds-RN notified;Repositioned    Home Living                      Prior Function            PT Goals (current goals can now be found in the care plan section) Acute Rehab PT Goals Patient Stated Goal: return home PT Goal Formulation:  With patient Time For Goal Achievement: 02/18/21 Potential to Achieve Goals: Good Progress towards PT goals: Not progressing toward goals - comment (continues to be limited by pain)    Frequency    Min 3X/week      PT Plan Current plan remains appropriate    Co-evaluation              AM-PAC PT "6 Clicks" Mobility   Outcome Measure  Help needed turning from your back to your side while in a flat bed without using bedrails?: None Help needed moving from lying on your back to sitting on the side of a flat bed without using bedrails?: None Help needed moving to and from a bed to a chair (including a wheelchair)?: A Little Help needed standing up from a chair using your arms (e.g., wheelchair or bedside chair)?: None Help needed to walk in  hospital room?: A Little Help needed climbing 3-5 steps with a railing? : A Little 6 Click Score: 21    End of Session   Activity Tolerance: Patient limited by pain Patient left: in bed;with call bell/phone within reach Nurse Communication: Mobility status PT Visit Diagnosis: Other abnormalities of gait and mobility (R26.89);Pain Pain - Right/Left: Left Pain - part of body: Ankle and joints of foot     Time: 1036-1050 PT Time Calculation (min) (ACUTE ONLY): 14 min  Charges:  $Gait Training: 8-22 mins                     Jazsmin Couse, PT, GCS 02/06/21,10:59 AM

## 2021-02-07 DIAGNOSIS — I10 Essential (primary) hypertension: Secondary | ICD-10-CM | POA: Diagnosis not present

## 2021-02-07 DIAGNOSIS — L03032 Cellulitis of left toe: Secondary | ICD-10-CM | POA: Diagnosis not present

## 2021-02-07 DIAGNOSIS — E1142 Type 2 diabetes mellitus with diabetic polyneuropathy: Secondary | ICD-10-CM | POA: Diagnosis not present

## 2021-02-07 DIAGNOSIS — M869 Osteomyelitis, unspecified: Secondary | ICD-10-CM | POA: Diagnosis not present

## 2021-02-07 LAB — GLUCOSE, CAPILLARY
Glucose-Capillary: 190 mg/dL — ABNORMAL HIGH (ref 70–99)
Glucose-Capillary: 212 mg/dL — ABNORMAL HIGH (ref 70–99)
Glucose-Capillary: 175 mg/dL — ABNORMAL HIGH (ref 70–99)
Glucose-Capillary: 187 mg/dL — ABNORMAL HIGH (ref 70–99)

## 2021-02-07 LAB — CULTURE, BLOOD (ROUTINE X 2)
Culture: NO GROWTH
Culture: NO GROWTH
Special Requests: ADEQUATE
Special Requests: ADEQUATE

## 2021-02-07 MED ORDER — SODIUM CHLORIDE 0.9 % IV SOLN
2.0000 g | Freq: Three times a day (TID) | INTRAVENOUS | Status: AC
  Administered 2021-02-07 – 2021-02-11 (×14): 2 g via INTRAVENOUS
  Filled 2021-02-07 (×15): qty 2

## 2021-02-07 NOTE — Progress Notes (Signed)
Pharmacy Antibiotic Note  Brett White is a 60 y.o. male admitted on 02/02/2021 with foot pain and infection.  Patient reported stepping on a thumbtack.  Found to have left toe osteomyelitis with cellulitis extending to the midfoot.  Underwent toe amputation on 5/7 and antibiotics were discontinued post-op.  Now with 1cm cellulitis around the surgical site, so Pharmacy has been consulted for [facility] dosing.  Renal function has been improving - SCr 0.92, CrCL > 100 ml/min, afebrile WBC WNL.  Plan: Elita Quick 2gm IV Q8H Monitor renal fxn, clinical improvement F/U Ortho plans  Height: 6\' 3"  (190.5 cm) Weight: 97.5 kg (214 lb 15.2 oz) IBW/kg (Calculated) : 84.5  Temp (24hrs), Avg:97.9 F (36.6 C), Min:97.8 F (36.6 C), Max:98 F (36.7 C)  Recent Labs  Lab 02/02/21 2220 02/03/21 0201 02/03/21 0217 02/04/21 0114 02/05/21 0210 02/06/21 0230  WBC 8.2  --  7.7 5.1 10.1 6.4  CREATININE 1.38*  --  1.32* 1.13 1.11 0.92  LATICACIDVEN 2.5* 1.0  --   --   --   --     Estimated Creatinine Clearance: 105.9 mL/min (by C-G formula based on SCr of 0.92 mg/dL).    No Known Allergies  Vanc 5/6 >> 5/8 CTX 5/6 >> 5/9 Flagyl 5/6 >> 5/7 Zosyn x1 5/6 Fortaz 5/10 >>   5/5 BCx - negative  Mykeisha Dysert D. 11-07-1982, PharmD, BCPS, BCCCP 02/07/2021, 8:29 AM

## 2021-02-07 NOTE — Progress Notes (Signed)
Patient ID: Levante Simones, male   DOB: November 13, 1960, 60 y.o.   MRN: [id number]  PROGRESS NOTE    Fortunato Nordin  YQM:[id number] DOB: [birth year] DOA: 02/02/2021 PCP: Patient, [name]   Brief Narrative:  60 year old male with history of hypertension, diabetes mellitus type 2, previous unspecified CVA in 2015, diabetic neuropathy presented with worsening left foot pain.  On presentation, left foot x-ray showed findings concerning for erosions of the first interphalangeal joint concerning for osteomyelitis.  He was started on broad-spectrum antibiotics.  He underwent left great toe amputation on 02/04/2021 by orthopedics.  Assessment & Plan:   Acute osteomyelitis of great toe of left foot with surrounding cellulitis -X-ray of the foot reveals bony destruction of the first interphalangeal joint concerning for osteomyelitis. -Blood cultures negative so far -Orthopedics following: Status post left great toe amputation on 02/04/2021 by orthopedics.  Wound care as per orthopedics recommendations.  Antibiotics discontinued on 02/05/21 as per orthopedics recommendations. -Still in severe pain requiring IV pain medications.  Orthopedics evaluated the wound today and has placed him on antibiotics for cellulitis around surgical incision  Diabetes mellitus type 2 uncontrolled with hyperglycemia -Continue CBGs with SSI.  Continue lantus 15 units nightly.  Hypertension -Not taking any antihypertensives for months.  Monitor blood pressure.  Continue hydrochlorothiazide and lisinopril  Diabetic neuropathy -Continue gabapentin  Mild hyponatremia -Monitor.  Treated with IV fluids   DVT prophylaxis: SCDs Code Status: Full Family Communication: None at bedside Disposition Plan: Status is: Inpatient because of severity of illness requiring IV antibiotics and surgical intervention  Dispo: The patient is from: Home              Anticipated d/c is to: Home in 1 to 2 days if cleared by orthopedics and  pain improves.           Patient currently is not medically stable to d/c.   Difficult to place patient No   Consultants: Orthopedics  Procedures: Left great toe amputation on 02/04/2021  Antimicrobials:  Anti-infectives (From admission, onward)   Start     Dose/Rate Route Frequency Ordered Stop   02/04/21 1600  ceFAZolin (ANCEF) IVPB 2g/100 mL premix        2 g 200 mL/hr over 30 Minutes Intravenous Every 8 hours 02/04/21 0952 02/05/21 0020   02/04/21 0800  ceFAZolin (ANCEF) IVPB 2g/100 mL premix  Status:  Discontinued        2 g 200 mL/hr over 30 Minutes Intravenous To ShortStay Surgical 02/03/21 1206 02/04/21 0949   02/03/21 2000  vancomycin (VANCOREADY) IVPB 1000 mg/200 mL        1,000 mg 200 mL/hr over 60 Minutes Intravenous Every 12 hours 02/02/21 2327 02/05/21 1036   02/03/21 0800  cefTRIAXone (ROCEPHIN) 2 g in sodium chloride 0.9 % 100 mL IVPB        2 g 200 mL/hr over 30 Minutes Intravenous Every 24 hours 02/03/21 0159 02/06/21 0759   02/03/21 0800  metroNIDAZOLE (FLAGYL) IVPB 500 mg        500 mg 100 mL/hr over 60 Minutes Intravenous Every 8 hours 02/03/21 0752 02/04/21 2359   02/02/21 2230  piperacillin-tazobactam (ZOSYN) IVPB 3.375 g        3.375 g 100 mL/hr over 30 Minutes Intravenous  Once 02/02/21 2224 02/03/21 0205   02/02/21 2230  vancomycin (VANCOREADY) IVPB 1750 mg/350 mL        1,750 mg 175 mL/hr over 120 Minutes Intravenous  Once 02/02/21 2224 02/03/21 0835  Subjective: Patient seen and examined at bedside.  No overnight fever, vomiting or worsening shortness breath reported.  Still complains of intermittent severe left foot pain and does not feel well.  Objective: Vitals:   02/06/21 2045 02/06/21 2134 02/07/21 0512 02/07/21 0734  BP: (!) 160/81 (!) 162/86 (!) 138/93 (!) 169/99  Pulse: 81 83 73 79  Resp:   17 17  Temp:   97.9 F (36.6 C) 98 F (36.7 C)  TempSrc:   Oral Oral  SpO2:   99% 97%  Weight:      Height:        Intake/Output Summary  (Last 24 hours) at 02/07/2021 0803 Last data filed at 02/06/2021 1500 Gross per 24 hour  Intake 480 ml  Output 1000 ml  Net -520 ml   Filed Weights   02/02/21 2200  Weight: 97.5 kg    Examination:  General exam: On room air currently.  No distress. Respiratory system: Bilateral decreased breath sounds at bases, no wheezing cardiovascular system: S1-S2 heard, rate controlled gastrointestinal system: Abdomen is mildly distended, soft and nontender.  Normal bowel sounds heard  extremities: Left foot dressing.  Trace lower extremity edema present.    Data Reviewed: I have personally reviewed following labs and imaging studies  CBC: Recent Labs  Lab 02/02/21 2220 02/03/21 0217 02/04/21 0114 02/05/21 0210 02/06/21 0230  WBC 8.2 7.7 5.1 10.1 6.4  NEUTROABS 6.2 4.9 2.9  --   --   HGB 13.4 12.9* 11.7* 12.9* 11.4*  HCT 41.3 39.9 35.3* 38.7* 34.5*  MCV 88.8 89.1 87.6 87.6 87.8  PLT 335 312 283 358 818   Basic Metabolic Panel: Recent Labs  Lab 02/02/21 2220 02/03/21 0217 02/04/21 0114 02/05/21 0210 02/06/21 0230  NA 135 134* 136 133* 134*  K 3.7 3.8 3.9 4.1 4.1  CL 100 103 102 100 101  CO2 25 24 27 25 25   GLUCOSE 311* 259* 141* 219* 142*  BUN 20 21* 26* 26* 19  CREATININE 1.38* 1.32* 1.13 1.11 0.92  CALCIUM 8.9 8.8* 8.6* 8.8* 8.5*  MG  --  2.1 2.1 1.9  --    GFR: Estimated Creatinine Clearance: 105.9 mL/min (by C-G formula based on SCr of 0.92 mg/dL). Liver Function Tests: Recent Labs  Lab 02/02/21 2220 02/03/21 0217  AST 16 12*  ALT 13 9  ALKPHOS 82 75  BILITOT 0.6 0.7  PROT 7.7 7.2  ALBUMIN 3.7 3.5   No results for input(s): LIPASE, AMYLASE in the last 168 hours. No results for input(s): AMMONIA in the last 168 hours. Coagulation Profile: Recent Labs  Lab 02/02/21 2220  INR 1.0   Cardiac Enzymes: No results for input(s): CKTOTAL, CKMB, CKMBINDEX, TROPONINI in the last 168 hours. BNP (last 3 results) No results for input(s): PROBNP in the last 8760  hours. HbA1C: No results for input(s): HGBA1C in the last 72 hours. CBG: Recent Labs  Lab 02/06/21 0931 02/06/21 1205 02/06/21 1616 02/06/21 2125 02/07/21 0735  GLUCAP 195* 211* 150* 191* 190*   Lipid Profile: No results for input(s): CHOL, HDL, LDLCALC, TRIG, CHOLHDL, LDLDIRECT in the last 72 hours. Thyroid Function Tests: No results for input(s): TSH, T4TOTAL, FREET4, T3FREE, THYROIDAB in the last 72 hours. Anemia Panel: No results for input(s): VITAMINB12, FOLATE, FERRITIN, TIBC, IRON, RETICCTPCT in the last 72 hours. Sepsis Labs: Recent Labs  Lab 02/02/21 2220 02/03/21 0201  LATICACIDVEN 2.5* 1.0    Recent Results (from the past 240 hour(s))  Culture, blood (Routine x 2)  Status: None (Preliminary result)   Collection Time: 02/02/21 10:17 PM   Specimen: BLOOD RIGHT HAND  Result Value Ref Range Status   Specimen Description BLOOD RIGHT HAND  Final   Special Requests AEROBIC BOTTLE ONLY Blood Culture adequate volume  Final   Culture   Final    NO GROWTH 4 DAYS Performed at [facility], [address], [place], [place] [address]    Report Status PENDING  Incomplete  Culture, blood (Routine x 2)     Status: None (Preliminary result)   Collection Time: 02/02/21 10:20 PM   Specimen: BLOOD LEFT HAND  Result Value Ref Range Status   Specimen Description BLOOD LEFT HAND  Final   Special Requests   Final    BOTTLES DRAWN AEROBIC AND ANAEROBIC Blood Culture adequate volume   Culture   Final    NO GROWTH 4 DAYS Performed at [facility], [place] [address] [place], [place] [address]    Report Status PENDING  Incomplete  Resp Panel by RT-PCR (Flu A&B, Covid) Nasopharyngeal Swab     Status: None   Collection Time: 02/03/21 12:26 PM   Specimen: Nasopharyngeal Swab; Nasopharyngeal(NP) swabs in vial transport medium  Result Value Ref Range Status   SARS Coronavirus 2 by RT PCR NEGATIVE NEGATIVE Final    Comment: (NOTE) SARS-CoV-2 target nucleic acids are NOT  DETECTED.  The SARS-CoV-2 RNA is generally detectable in upper respiratory specimens during the acute phase of infection. The lowest concentration of SARS-CoV-2 viral copies this assay can detect is 138 copies/mL. A negative result does not preclude SARS-Cov-2 infection and should not be used as the sole basis for treatment or other patient management decisions. A negative result may occur with  improper specimen collection/handling, submission of specimen other than nasopharyngeal swab, presence of viral mutation(s) within the areas targeted by this assay, and inadequate number of viral copies(<138 copies/mL). A negative result must be combined with clinical observations, patient history, and epidemiological information. The expected result is Negative.  Fact Sheet for Patients:  EntrepreneurPulse.com.au  Fact Sheet for Healthcare Providers:  IncredibleEmployment.be  This test is no t yet approved or cleared by the [place] FDA and  has been authorized for detection and/or diagnosis of SARS-CoV-2 by FDA under an Emergency Use Authorization (EUA). This EUA will remain  in effect (meaning this test can be used) for the duration of the COVID-19 declaration under Section 564(b)(1) of the Act, 21 U.S.C.section 360bbb-3(b)(1), unless the authorization is terminated  or revoked sooner.       Influenza A by PCR NEGATIVE NEGATIVE Final   Influenza B by PCR NEGATIVE NEGATIVE Final    Comment: (NOTE) The Xpert Xpress SARS-CoV-2/FLU/RSV plus assay is intended as an aid in the diagnosis of influenza from Nasopharyngeal swab specimens and should not be used as a sole basis for treatment. Nasal washings and aspirates are unacceptable for Xpert Xpress SARS-CoV-2/FLU/RSV testing.  Fact Sheet for Patients: EntrepreneurPulse.com.au  Fact Sheet for Healthcare Providers: IncredibleEmployment.be  This test is not yet  approved or cleared by the [place] FDA and has been authorized for detection and/or diagnosis of SARS-CoV-2 by FDA under an Emergency Use Authorization (EUA). This EUA will remain in effect (meaning this test can be used) for the duration of the COVID-19 declaration under Section 564(b)(1) of the Act, 21 U.S.C. section 360bbb-3(b)(1), unless the authorization is terminated or revoked.  Performed at [facility], [place] [address], [place], [place] [address]  Radiology Studies: No results found.      Scheduled Meds: . acidophilus  1 capsule Oral Daily  . vitamin C  1,000 mg Oral Daily  . docusate sodium  100 mg Oral Daily  . enoxaparin (LOVENOX) injection  40 mg Subcutaneous Q24H  . gabapentin  300 mg Oral QHS  . hydrochlorothiazide  12.5 mg Oral Daily  . insulin aspart  0-15 Units Subcutaneous TID AC & HS  . insulin glargine  15 Units Subcutaneous QHS  . insulin starter kit- pen needles  1 kit Other Once  . lisinopril  20 mg Oral Daily  . nutrition supplement (JUVEN)  1 packet Oral BID BM  . pantoprazole  40 mg Oral Daily  . senna-docusate  1 tablet Oral BID  . zinc sulfate  220 mg Oral Daily   Continuous Infusions: . magnesium sulfate bolus IVPB            Aline August, MD [facility] 02/07/2021, 8:03 AM

## 2021-02-07 NOTE — Progress Notes (Signed)
Patient ID: Brett White, male   DOB: 04-Oct-1960, 60 y.o.   MRN: [id number] Patient is status post left great toe amputation.  Patient complains of persistent sharp burning pain.  The dressing was removed and patient does have an area of cellulitis 1 cm around the surgical incision.  This is tender to palpation there is no drainage the wound edges are well approximated no ischemic changes.  I will place him back on IV antibiotics and see how this does over the next couple days.  May need to proceed with amputation of the metatarsal head.

## 2021-02-08 DIAGNOSIS — E1142 Type 2 diabetes mellitus with diabetic polyneuropathy: Secondary | ICD-10-CM | POA: Diagnosis not present

## 2021-02-08 DIAGNOSIS — L03032 Cellulitis of left toe: Secondary | ICD-10-CM | POA: Diagnosis not present

## 2021-02-08 DIAGNOSIS — M86172 Other acute osteomyelitis, left ankle and foot: Secondary | ICD-10-CM

## 2021-02-08 DIAGNOSIS — M869 Osteomyelitis, unspecified: Secondary | ICD-10-CM | POA: Diagnosis not present

## 2021-02-08 DIAGNOSIS — I10 Essential (primary) hypertension: Secondary | ICD-10-CM | POA: Diagnosis not present

## 2021-02-08 LAB — CBC WITH DIFFERENTIAL/PLATELET
Abs Immature Granulocytes: 0.02 10*3/uL (ref 0.00–0.07)
Basophils Absolute: 0 10*3/uL (ref 0.0–0.1)
Basophils Relative: 1 %
Eosinophils Absolute: 0.2 10*3/uL (ref 0.0–0.5)
Eosinophils Relative: 2 %
HCT: 38.9 % — ABNORMAL LOW (ref 39.0–52.0)
Hemoglobin: 12.9 g/dL — ABNORMAL LOW (ref 13.0–17.0)
Immature Granulocytes: 0 %
Lymphocytes Relative: 27 %
Lymphs Abs: 1.7 10*3/uL (ref 0.7–4.0)
MCH: 28.7 pg (ref 26.0–34.0)
MCHC: 33.2 g/dL (ref 30.0–36.0)
MCV: 86.6 fL (ref 80.0–100.0)
Monocytes Absolute: 0.5 10*3/uL (ref 0.1–1.0)
Monocytes Relative: 8 %
Neutro Abs: 4 10*3/uL (ref 1.7–7.7)
Neutrophils Relative %: 62 %
Platelets: 310 10*3/uL (ref 150–400)
RBC: 4.49 MIL/uL (ref 4.22–5.81)
RDW: 11.9 % (ref 11.5–15.5)
WBC: 6.4 10*3/uL (ref 4.0–10.5)
nRBC: 0.3 % — ABNORMAL HIGH (ref 0.0–0.2)

## 2021-02-08 LAB — GLUCOSE, CAPILLARY
Glucose-Capillary: 234 mg/dL — ABNORMAL HIGH (ref 70–99)
Glucose-Capillary: 163 mg/dL — ABNORMAL HIGH (ref 70–99)
Glucose-Capillary: 202 mg/dL — ABNORMAL HIGH (ref 70–99)
Glucose-Capillary: 199 mg/dL — ABNORMAL HIGH (ref 70–99)

## 2021-02-08 LAB — BASIC METABOLIC PANEL
Anion gap: 9 (ref 5–15)
BUN: 22 mg/dL — ABNORMAL HIGH (ref 6–20)
CO2: 29 mmol/L (ref 22–32)
Calcium: 8.8 mg/dL — ABNORMAL LOW (ref 8.9–10.3)
Chloride: 95 mmol/L — ABNORMAL LOW (ref 98–111)
Creatinine, Ser: 0.95 mg/dL (ref 0.61–1.24)
GFR, Estimated: >60 mL/min (ref >60)
Glucose, Bld: 211 mg/dL — ABNORMAL HIGH (ref 70–99)
Potassium: 4.3 mmol/L (ref 3.5–5.1)
Sodium: 133 mmol/L — ABNORMAL LOW (ref 135–145)

## 2021-02-08 LAB — C-REACTIVE PROTEIN: CRP: 1 mg/dL — ABNORMAL HIGH (ref <1.0)

## 2021-02-08 LAB — MAGNESIUM: Magnesium: 1.9 mg/dL (ref 1.7–2.4)

## 2021-02-08 MED ORDER — INSULIN GLARGINE 100 UNIT/ML ~~LOC~~ SOLN
20.0000 [IU] | Freq: Every day | SUBCUTANEOUS | Status: DC
  Administered 2021-02-08 – 2021-02-14 (×7): 20 [IU] via SUBCUTANEOUS
  Filled 2021-02-08 (×9): qty 0.2

## 2021-02-08 NOTE — Progress Notes (Signed)
Inpatient Diabetes Program Recommendations  AACE/ADA: New Consensus Statement on Inpatient Glycemic Control (2015)  Target Ranges:  Prepandial:   less than 140 mg/dL      Peak postprandial:   less than 180 mg/dL (1-2 hours)      Critically ill patients:  140 - 180 mg/dL   Lab Results  Component Value Date   GLUCAP 234 (H) 02/08/2021   HGBA1C 11.5 (H) 02/03/2021    Review of Glycemic Control Results for Brett White, HANDS (MRN [id number]) as of 02/08/2021 09:01  Ref. Range 02/07/2021 07:35 02/07/2021 11:35 02/07/2021 16:22 02/07/2021 19:59 02/08/2021 06:36  Glucose-Capillary Latest Ref Range: 70 - 99 mg/dL 937 (H) 902 (H) 409 (H) 187 (H) 234 (H)   Diabetes history: DM 2 Outpatient Diabetes medications: Metformin 1000 mg bid Current orders for Inpatient glycemic control:  Novolog 0-15 units tid & hs  A1c 11.5% on 5/6  Inpatient Diabetes Program Recommendations:    - increase Lantus to 20 units  Spoke with pt on 5/6. Pt had transportation issues and affordability issues with insulin that was newly prescribed by his PCP. May need to be converted to 70/30 insulin at time of d/c for affordability at [facility].  Thanks,  Christena Deem RN, MSN, BC-ADM Inpatient Diabetes Coordinator Team Pager [phone] (8a-5p)

## 2021-02-08 NOTE — Progress Notes (Addendum)
PROGRESS NOTE    Brett White  MEB:[id number] DOB: Mar 19, 1961 DOA: 02/02/2021 PCP: Patient, [name]   Brief Narrative: Brett White is a 60 y.o. male with history of diabetes mellitus type 2, history of CVA, diabetic neuropathy, hypertension.  Patient presented secondary to worsening left foot pain.  Patient was found to have evidence of osteomyelitis.  Empiric antibiotics.  Started and orthopedic surgery was consulted.  Patient underwent left great toe amputation on 5/7.  With continued pain, antibiotics were continued and patient is set for likely first ray amputation on 5/13.   Assessment & Plan:   Principal Problem:   Osteomyelitis of great toe of left foot (HCC) Active Problems:   Uncontrolled type 2 diabetes mellitus with hyperglycemia, without long-term current use of insulin (HCC)   Essential hypertension   Lactic acidosis   Diabetic peripheral neuropathy associated with type 2 diabetes mellitus (HCC)   Cellulitis of toe of left foot   Osteomyelitis (HCC)   Acute osteomyelitis of left great toe Left foot Cellulitis On admission, x-ray significant for bony destruction consistent with osteomyelitis confirmed on MRI. Empiric Vancomycin, Zosyn, Flagyl initiated. Zosyn replaced with Ceftriaxone. Ancef added as well. Antibiotics discontinued after surgery. Orthopedic surgery consulted and performed left great toe amputation on 5/7. Orthopedic surgery recommended resumption of IV antibiotics for continued foot pain with plan for possible first ray amputation on 5/13. -Orthopedic surgery recommendations -Continue Ceftazidime IV  Diabetes mellitus, type 2 Patient is on metformin as an outpatient. Last hemoglobin A1C of 11.5% on admission. Currently on Lantus and SSI. Would likely benefit from insulin therapy on discharge with significantly elevated hemoglobin A1C. Fasting blood sugar of 234 this morning. -Increase to Lantus 20 units daily -Continue SSI  Primary  hypertension -Continue hydrochlorothiazide and lisinopril  Diabetic neuropathy -Continue gabapentin  Hyponatremia Mild and likely pseudohyponatremia from hyperglycemia. Corrected sodium today of 135-136.   DVT prophylaxis: Lovenox Code Status:   Code Status: Full Code Family Communication: None at bedside Disposition Plan: Discharge home pending orthopedic surgery recommendations/management.   Consultants:   Orthopedic surgery  Procedures:   LEFT GREAT TOE AMPUTATION (02/04/2021)  Antimicrobials:  Vancomycin  Zosyn  Flagyl  Ceftriaxone  Ancef  Ceftaz   Subjective: Still with significant left foot pain. Some nausea when sitting up.  Objective: Vitals:   02/07/21 1446 02/07/21 2042 02/08/21 0421 02/08/21 0754  BP: 120/72 (!) 166/93 (!) 136/94 (!) 142/90  Pulse: 69 100 71 67  Resp: 17 18 18 18   Temp: 98.5 F (36.9 C) (!) 97.5 F (36.4 C) 97.8 F (36.6 C) 97.8 F (36.6 C)  TempSrc: Oral Oral Oral Oral  SpO2: 97% 92% 97% 97%  Weight:      Height:        Intake/Output Summary (Last 24 hours) at 02/08/2021 1544 Last data filed at 02/07/2021 1800 Gross per 24 hour  Intake 200 ml  Output --  Net 200 ml   Filed Weights   02/02/21 2200  Weight: 97.5 kg    Examination:  General exam: Appears calm and comfortable and in no acute distress. Conversant Respiratory: Clear to auscultation. Respiratory effort normal with no intercostal retractions or use of accessory muscles Cardiovascular: S1 & S2 heard, RRR. No murmurs, rubs, gallops or clicks. No edema Gastrointestinal: Abdomen is nondistended, soft and nontender. No masses felt. Normal bowel sounds heard Neurologic: No focal neurological deficits Musculoskeletal: No calf tenderness. Left foot in dressing/ACE bandage Skin: No cyanosis. No new rashes Psychiatry: Alert and oriented.  Memory intact. Mood & affect appropriate    Data Reviewed: I have personally reviewed following labs and imaging  studies  CBC Lab Results  Component Value Date   WBC 6.4 02/08/2021   RBC 4.49 02/08/2021   HGB 12.9 (L) 02/08/2021   HCT 38.9 (L) 02/08/2021   MCV 86.6 02/08/2021   MCH 28.7 02/08/2021   PLT 310 02/08/2021   MCHC 33.2 02/08/2021   RDW 11.9 02/08/2021   LYMPHSABS 1.7 02/08/2021   MONOABS 0.5 02/08/2021   EOSABS 0.2 02/08/2021   BASOSABS 0.0 03/54/6568     Last metabolic panel Lab Results  Component Value Date   NA 133 (L) 02/08/2021   K 4.3 02/08/2021   CL 95 (L) 02/08/2021   CO2 29 02/08/2021   BUN 22 (H) 02/08/2021   CREATININE 0.95 02/08/2021   GLUCOSE 211 (H) 02/08/2021   GFRNONAA >60 02/08/2021   CALCIUM 8.8 (L) 02/08/2021   PROT 7.2 02/03/2021   ALBUMIN 3.5 02/03/2021   BILITOT 0.7 02/03/2021   ALKPHOS 75 02/03/2021   AST 12 (L) 02/03/2021   ALT 9 02/03/2021   ANIONGAP 9 02/08/2021    CBG (last 3)  Recent Labs    02/07/21 1959 02/08/21 0636 02/08/21 1141  GLUCAP 187* 234* 163*     GFR: Estimated Creatinine Clearance: 102.5 mL/min (by C-G formula based on SCr of 0.95 mg/dL).  Coagulation Profile: Recent Labs  Lab 02/02/21 2220  INR 1.0    Recent Results (from the past 240 hour(s))  Culture, blood (Routine x 2)     Status: None   Collection Time: 02/02/21 10:17 PM   Specimen: BLOOD RIGHT HAND  Result Value Ref Range Status   Specimen Description BLOOD RIGHT HAND  Final   Special Requests AEROBIC BOTTLE ONLY Blood Culture adequate volume  Final   Culture   Final    NO GROWTH 5 DAYS Performed at [facility], [place] [address] [place], [place] [address]    Report Status 02/07/2021 FINAL  Final  Culture, blood (Routine x 2)     Status: None   Collection Time: 02/02/21 10:20 PM   Specimen: BLOOD LEFT HAND  Result Value Ref Range Status   Specimen Description BLOOD LEFT HAND  Final   Special Requests   Final    BOTTLES DRAWN AEROBIC AND ANAEROBIC Blood Culture adequate volume   Culture   Final    NO GROWTH 5 DAYS Performed at [facility], [place] [address] [place], [place] [address]    Report Status 02/07/2021 FINAL  Final  Resp Panel by RT-PCR (Flu A&B, Covid) Nasopharyngeal Swab     Status: None   Collection Time: 02/03/21 12:26 PM   Specimen: Nasopharyngeal Swab; Nasopharyngeal(NP) swabs in vial transport medium  Result Value Ref Range Status   SARS Coronavirus 2 by RT PCR NEGATIVE NEGATIVE Final    Comment: (NOTE) SARS-CoV-2 target nucleic acids are NOT DETECTED.  The SARS-CoV-2 RNA is generally detectable in upper respiratory specimens during the acute phase of infection. The lowest concentration of SARS-CoV-2 viral copies this assay can detect is 138 copies/mL. A negative result does not preclude SARS-Cov-2 infection and should not be used as the sole basis for treatment or other patient management decisions. A negative result may occur with  improper specimen collection/handling, submission of specimen other than nasopharyngeal swab, presence of viral mutation(s) within the areas targeted by this assay, and inadequate number of viral copies(<138 copies/mL). A negative result must be  combined with clinical observations, patient history, and epidemiological information. The expected result is Negative.  Fact Sheet for Patients:  EntrepreneurPulse.com.au  Fact Sheet for Healthcare Providers:  IncredibleEmployment.be  This test is no t yet approved or cleared by the [place] FDA and  has been authorized for detection and/or diagnosis of SARS-CoV-2 by FDA under an Emergency Use Authorization (EUA). This EUA will remain  in effect (meaning this test can be used) for the duration of the COVID-19 declaration under Section 564(b)(1) of the Act, 21 U.S.C.section 360bbb-3(b)(1), unless the authorization is terminated  or revoked sooner.       Influenza A by PCR NEGATIVE NEGATIVE Final   Influenza B by PCR NEGATIVE NEGATIVE Final    Comment: (NOTE) The Xpert  Xpress SARS-CoV-2/FLU/RSV plus assay is intended as an aid in the diagnosis of influenza from Nasopharyngeal swab specimens and should not be used as a sole basis for treatment. Nasal washings and aspirates are unacceptable for Xpert Xpress SARS-CoV-2/FLU/RSV testing.  Fact Sheet for Patients: EntrepreneurPulse.com.au  Fact Sheet for Healthcare Providers: IncredibleEmployment.be  This test is not yet approved or cleared by the [place] FDA and has been authorized for detection and/or diagnosis of SARS-CoV-2 by FDA under an Emergency Use Authorization (EUA). This EUA will remain in effect (meaning this test can be used) for the duration of the COVID-19 declaration under Section 564(b)(1) of the Act, 21 U.S.C. section 360bbb-3(b)(1), unless the authorization is terminated or revoked.  Performed at [facility], [place] [address] [place], [place] [address]         Radiology Studies: No results found.      Scheduled Meds: . acidophilus  1 capsule Oral Daily  . vitamin C  1,000 mg Oral Daily  . docusate sodium  100 mg Oral Daily  . enoxaparin (LOVENOX) injection  40 mg Subcutaneous Q24H  . gabapentin  300 mg Oral QHS  . hydrochlorothiazide  12.5 mg Oral Daily  . insulin aspart  0-15 Units Subcutaneous TID AC & HS  . insulin glargine  15 Units Subcutaneous QHS  . insulin starter kit- pen needles  1 kit Other Once  . lisinopril  20 mg Oral Daily  . nutrition supplement (JUVEN)  1 packet Oral BID BM  . pantoprazole  40 mg Oral Daily  . senna-docusate  1 tablet Oral BID  . zinc sulfate  220 mg Oral Daily   Continuous Infusions: . cefTAZidime (FORTAZ)  IV 2 g (02/08/21 3151)  . magnesium sulfate bolus IVPB       LOS: 5 days     Cordelia Poche, MD [facility] 02/08/2021, 3:44 PM  If 7PM-7AM, please contact night-coverage [email or web address]

## 2021-02-08 NOTE — Progress Notes (Signed)
PT Cancellation Note  Patient Details Name: Brett White MRN: [id number] DOB: [birth year]   Cancelled Treatment:    Reason Eval/Treat Not Completed: Medical issues which prohibited therapy;Pain limiting ability to participate. Pt declining PT due to pain and N&V. Pt reports vomiting overnight and this morning. PT to re-attempt as time allows.   Ilda Foil 02/08/2021, 12:10 PM   Aida Raider, PT  Office # [phone] Pager [phone]

## 2021-02-08 NOTE — Progress Notes (Signed)
Patient is a pleasant 60 year old gentleman who is postop day 4 status post left great toe amputation.  He has been complaining of throbbing pain over the incision area he says it "feels like a heartbeat ".  He says it is caused him difficulty sleeping.   Vital signs stable afebrile patient has had some nausea and vomiting overnight but otherwise no other difficulties.  Wound edges are well opposed he does have some surrounding erythema but no foul odor no purulent drainage.  New pictures taken today.  White blood cell count was stable  Patient was also seen by Dr. Lajoyce Corners who felt that pain was out of proportion to the appearance of the wound.  However he will reevaluate tomorrow.  If continued difficulties spoke with the patient about a first ray amputation on Friday

## 2021-02-09 ENCOUNTER — Other Ambulatory Visit: Payer: Self-pay | Admitting: Physician Assistant

## 2021-02-09 DIAGNOSIS — L03032 Cellulitis of left toe: Secondary | ICD-10-CM | POA: Diagnosis not present

## 2021-02-09 DIAGNOSIS — E1142 Type 2 diabetes mellitus with diabetic polyneuropathy: Secondary | ICD-10-CM | POA: Diagnosis not present

## 2021-02-09 DIAGNOSIS — M869 Osteomyelitis, unspecified: Secondary | ICD-10-CM | POA: Diagnosis not present

## 2021-02-09 DIAGNOSIS — I10 Essential (primary) hypertension: Secondary | ICD-10-CM | POA: Diagnosis not present

## 2021-02-09 LAB — GLUCOSE, CAPILLARY
Glucose-Capillary: 204 mg/dL — ABNORMAL HIGH (ref 70–99)
Glucose-Capillary: 159 mg/dL — ABNORMAL HIGH (ref 70–99)
Glucose-Capillary: 176 mg/dL — ABNORMAL HIGH (ref 70–99)
Glucose-Capillary: 205 mg/dL — ABNORMAL HIGH (ref 70–99)

## 2021-02-09 NOTE — Plan of Care (Signed)
Patient reported pain 10/10 throughout the shift, PRN pain medications given. Appears comfortable. Will monitor closely. Problem: Education: Goal: Knowledge of General Education information will improve Description: Including pain rating scale, medication(s)/side effects and non-pharmacologic comfort measures Outcome: Progressing   Problem: Health Behavior/Discharge Planning: Goal: Ability to manage health-related needs will improve Outcome: Progressing   Problem: Clinical Measurements: Goal: Will remain free from infection Outcome: Progressing Goal: Diagnostic test results will improve Outcome: Progressing Goal: Respiratory complications will improve Outcome: Progressing Goal: Cardiovascular complication will be avoided Outcome: Progressing   Problem: Nutrition: Goal: Adequate nutrition will be maintained Outcome: Progressing   Problem: Elimination: Goal: Will not experience complications related to bowel motility Outcome: Progressing

## 2021-02-09 NOTE — Progress Notes (Signed)
PROGRESS NOTE    Alias Brett White  [name]:[id number] DOB: 11-21-1960 DOA: 02/02/2021 PCP: Patient, [name]   Brief Narrative: Brett White is a 60 y.o. male with history of diabetes mellitus type 2, history of CVA, diabetic neuropathy, hypertension.  Patient presented secondary to worsening left foot pain.  Patient was found to have evidence of osteomyelitis.  Empiric antibiotics.  Started and orthopedic surgery was consulted.  Patient underwent left great toe amputation on 5/7.  With continued pain, antibiotics were continued and patient is set for likely first ray amputation on 5/13.   Assessment & Plan:   Principal Problem:   Osteomyelitis of great toe of left foot (HCC) Active Problems:   Uncontrolled type 2 diabetes mellitus with hyperglycemia, without long-term current use of insulin (HCC)   Essential hypertension   Lactic acidosis   Diabetic peripheral neuropathy associated with type 2 diabetes mellitus (HCC)   Cellulitis of toe of left foot   Osteomyelitis (HCC)   Acute osteomyelitis of left great toe Left foot Cellulitis On admission, x-ray significant for bony destruction consistent with osteomyelitis confirmed on MRI. Empiric Vancomycin, Zosyn, Flagyl initiated. Zosyn replaced with Ceftriaxone. Ancef added as well. Antibiotics discontinued after surgery. Orthopedic surgery consulted and performed left great toe amputation on 5/7. Orthopedic surgery recommended resumption of IV antibiotics for continued foot pain with plan for possible first ray amputation on 5/13. -Orthopedic surgery recommendations: plan for surgery (left first ray amputation) on 5/13 -Continue Ceftazidime IV  Diabetes mellitus, type 2 Patient is on metformin as an outpatient. Last hemoglobin A1C of 11.5% on admission. Currently on Lantus and SSI. Would likely benefit from insulin therapy on discharge with significantly elevated hemoglobin A1C. Fasting blood sugar of 204 this morning. It is  possible his infection may be contributing to poorly controlled blood sugar at this time. -Continue Lantus 20 units daily; if fasting blood sugar continues to be elevated, may do better with a regimen of Lantus 30 units, close to ~0.3 units/kg -Continue SSI  Primary hypertension -Continue hydrochlorothiazide and lisinopril  Diabetic neuropathy -Continue gabapentin  Hyponatremia Mild and likely pseudohyponatremia from hyperglycemia. Corrected sodium of 135-136 on 5/11.   DVT prophylaxis: Lovenox Code Status:   Code Status: Full Code Family Communication: None at bedside Disposition Plan: Discharge home pending orthopedic surgery recommendations/management.   Consultants:   Orthopedic surgery  Procedures:   LEFT GREAT TOE AMPUTATION (02/04/2021)  Antimicrobials:  Vancomycin  Zosyn  Flagyl  Ceftriaxone  Ancef  Ceftaz   Subjective: Nausea has improved from yesterday. Still in a lot of pain from his foot infection.  Objective: Vitals:   02/08/21 1603 02/08/21 2044 02/09/21 0417 02/09/21 0744  BP: 123/83 133/88 123/83 (!) 149/98  Pulse: 64 66 70 72  Resp: 18 16 14 18   Temp: 98.2 F (36.8 C) 98.1 F (36.7 C) 98.2 F (36.8 C) 98 F (36.7 C)  TempSrc: Oral Oral Oral Oral  SpO2: 97% 97% 97% 98%  Weight:      Height:        Intake/Output Summary (Last 24 hours) at 02/09/2021 1054 Last data filed at 02/09/2021 0900 Gross per 24 hour  Intake 480 ml  Output --  Net 480 ml   Filed Weights   02/02/21 2200  Weight: 97.5 kg    Examination:  General exam: Appears calm and comfortable Respiratory system: Clear to auscultation. Respiratory effort normal. Cardiovascular system: S1 & S2 heard, RRR. No murmurs, rubs, gallops or clicks. Gastrointestinal system: Abdomen is nondistended,  soft and nontender. No organomegaly or masses felt. Normal bowel sounds heard. Central nervous system: Alert and oriented. No focal neurological deficits. Musculoskeletal: No edema.  No calf tenderness. Left foot in bandages Skin: No cyanosis. No rashes Psychiatry: Judgement and insight appear normal. Mood & affect appropriate.     Data Reviewed: I have personally reviewed following labs and imaging studies  CBC Lab Results  Component Value Date   WBC 6.4 02/08/2021   RBC 4.49 02/08/2021   HGB 12.9 (L) 02/08/2021   HCT 38.9 (L) 02/08/2021   MCV 86.6 02/08/2021   MCH 28.7 02/08/2021   PLT 310 02/08/2021   MCHC 33.2 02/08/2021   RDW 11.9 02/08/2021   LYMPHSABS 1.7 02/08/2021   MONOABS 0.5 02/08/2021   EOSABS 0.2 02/08/2021   BASOSABS 0.0 32/54/9826     Last metabolic panel Lab Results  Component Value Date   NA 133 (L) 02/08/2021   K 4.3 02/08/2021   CL 95 (L) 02/08/2021   CO2 29 02/08/2021   BUN 22 (H) 02/08/2021   CREATININE 0.95 02/08/2021   GLUCOSE 211 (H) 02/08/2021   GFRNONAA >60 02/08/2021   CALCIUM 8.8 (L) 02/08/2021   PROT 7.2 02/03/2021   ALBUMIN 3.5 02/03/2021   BILITOT 0.7 02/03/2021   ALKPHOS 75 02/03/2021   AST 12 (L) 02/03/2021   ALT 9 02/03/2021   ANIONGAP 9 02/08/2021    CBG (last 3)  Recent Labs    02/08/21 1735 02/08/21 2046 02/09/21 0642  GLUCAP 202* 199* 204*     GFR: Estimated Creatinine Clearance: 102.5 mL/min (by C-G formula based on SCr of 0.95 mg/dL).  Coagulation Profile: Recent Labs  Lab 02/02/21 2220  INR 1.0    Recent Results (from the past 240 hour(s))  Culture, blood (Routine x 2)     Status: None   Collection Time: 02/02/21 10:17 PM   Specimen: BLOOD RIGHT HAND  Result Value Ref Range Status   Specimen Description BLOOD RIGHT HAND  Final   Special Requests AEROBIC BOTTLE ONLY Blood Culture adequate volume  Final   Culture   Final    NO GROWTH 5 DAYS Performed at [facility], [place] [address], [place], [place] [address]    Report Status 02/07/2021 FINAL  Final  Culture, blood (Routine x 2)     Status: None   Collection Time: 02/02/21 10:20 PM   Specimen: BLOOD LEFT HAND  Result  Value Ref Range Status   Specimen Description BLOOD LEFT HAND  Final   Special Requests   Final    BOTTLES DRAWN AEROBIC AND ANAEROBIC Blood Culture adequate volume   Culture   Final    NO GROWTH 5 DAYS Performed at [facility], [place] [address], [place], [place] [address]    Report Status 02/07/2021 FINAL  Final  Resp Panel by RT-PCR (Flu A&B, Covid) Nasopharyngeal Swab     Status: None   Collection Time: 02/03/21 12:26 PM   Specimen: Nasopharyngeal Swab; Nasopharyngeal(NP) swabs in vial transport medium  Result Value Ref Range Status   SARS Coronavirus 2 by RT PCR NEGATIVE NEGATIVE Final    Comment: (NOTE) SARS-CoV-2 target nucleic acids are NOT DETECTED.  The SARS-CoV-2 RNA is generally detectable in upper respiratory specimens during the acute phase of infection. The lowest concentration of SARS-CoV-2 viral copies this assay can detect is 138 copies/mL. A negative result does not preclude SARS-Cov-2 infection and should not be used as the sole basis for treatment or other patient  management decisions. A negative result may occur with  improper specimen collection/handling, submission of specimen other than nasopharyngeal swab, presence of viral mutation(s) within the areas targeted by this assay, and inadequate number of viral copies(<138 copies/mL). A negative result must be combined with clinical observations, patient history, and epidemiological information. The expected result is Negative.  Fact Sheet for Patients:  EntrepreneurPulse.com.au  Fact Sheet for Healthcare Providers:  IncredibleEmployment.be  This test is no t yet approved or cleared by the [place] FDA and  has been authorized for detection and/or diagnosis of SARS-CoV-2 by FDA under an Emergency Use Authorization (EUA). This EUA will remain  in effect (meaning this test can be used) for the duration of the COVID-19 declaration under Section 564(b)(1) of the Act,  21 U.S.C.section 360bbb-3(b)(1), unless the authorization is terminated  or revoked sooner.       Influenza A by PCR NEGATIVE NEGATIVE Final   Influenza B by PCR NEGATIVE NEGATIVE Final    Comment: (NOTE) The Xpert Xpress SARS-CoV-2/FLU/RSV plus assay is intended as an aid in the diagnosis of influenza from Nasopharyngeal swab specimens and should not be used as a sole basis for treatment. Nasal washings and aspirates are unacceptable for Xpert Xpress SARS-CoV-2/FLU/RSV testing.  Fact Sheet for Patients: EntrepreneurPulse.com.au  Fact Sheet for Healthcare Providers: IncredibleEmployment.be  This test is not yet approved or cleared by the [place] FDA and has been authorized for detection and/or diagnosis of SARS-CoV-2 by FDA under an Emergency Use Authorization (EUA). This EUA will remain in effect (meaning this test can be used) for the duration of the COVID-19 declaration under Section 564(b)(1) of the Act, 21 U.S.C. section 360bbb-3(b)(1), unless the authorization is terminated or revoked.  Performed at [facility], [place] [address] [place], [place] [address]         Radiology Studies: No results found.      Scheduled Meds: . acidophilus  1 capsule Oral Daily  . vitamin C  1,000 mg Oral Daily  . docusate sodium  100 mg Oral Daily  . enoxaparin (LOVENOX) injection  40 mg Subcutaneous Q24H  . gabapentin  300 mg Oral QHS  . hydrochlorothiazide  12.5 mg Oral Daily  . insulin aspart  0-15 Units Subcutaneous TID AC & HS  . insulin glargine  20 Units Subcutaneous QHS  . insulin starter kit- pen needles  1 kit Other Once  . lisinopril  20 mg Oral Daily  . nutrition supplement (JUVEN)  1 packet Oral BID BM  . pantoprazole  40 mg Oral Daily  . senna-docusate  1 tablet Oral BID  . zinc sulfate  220 mg Oral Daily   Continuous Infusions: . cefTAZidime (FORTAZ)  IV 2 g (02/09/21 0859)  . magnesium sulfate bolus IVPB        LOS: 6 days     Cordelia Poche, MD [facility] 02/09/2021, 10:54 AM  If 7PM-7AM, please contact night-coverage [email or web address]

## 2021-02-09 NOTE — Progress Notes (Signed)
Physical Therapy Treatment Patient Details Name: Brett White MRN: [id number] DOB: 05-Jul-1961 Today's Date: 02/09/2021    History of Present Illness The pt is a 60 yo male presenting 5/5 due to non healing wound on L toe, is now s/p amputation of L great toe due to osteomyelitis on 5/7. Pt awaiting 5th ray amputation 5/13. PMH includes:  DM II with nuropathy and prior back surgery.    PT Comments    Pt with depressed affect upon PT arrival to room, pt stating he feels down due to severe L foot pain and pending second surgery tomorrow, PT encouraged pt throughout session. Pt ambulatory with RW and hop-to gait pattern, PT giving max cues for NWB LLE but pt frequently placing L heel down on ground. PT explained NWB status is to promote proper healing, pt states he understands but continues to intermittently WB. PT feels pt will struggle at d/c with WB precautions, continue to emphasize NWB.    Follow Up Recommendations  No PT follow up     Equipment Recommendations  Rolling walker with 5" wheels;3in1 (PT)    Recommendations for Other Services       Precautions / Restrictions Precautions Precautions: Fall Required Braces or Orthoses: Other Brace Other Brace: post-op shoe - pt initially declines but dons with PT prompting Restrictions Weight Bearing Restrictions: Yes LLE Weight Bearing: Non weight bearing    Mobility  Bed Mobility Overal bed mobility: Modified Independent                  Transfers Overall transfer level: Needs assistance Equipment used: Rolling walker (2 wheeled) Transfers: Sit to/from Stand Sit to Stand: Supervision         General transfer comment: for safety, slow to rise and steady. PT cuing to NWB LLE, but puts weight through heel anyway.  Ambulation/Gait Ambulation/Gait assistance: Min guard Gait Distance (Feet): 50 Feet (x2) Assistive device: Rolling walker (2 wheeled) Gait Pattern/deviations: Step-to pattern;Trunk flexed Gait  velocity: decr   General Gait Details: close guard for safety, VC for hop-to gait but pt with intermittent heel WB during gait. PT encouraging frequent standing rest breaks to recover fatigue and improve pt adherence to precautions   Stairs             Wheelchair Mobility    Modified Rankin (Stroke Patients Only)       Balance Overall balance assessment: Mild deficits observed, not formally tested                                          Cognition Arousal/Alertness: Awake/alert Behavior During Therapy: WFL for tasks assessed/performed Overall Cognitive Status: Impaired/Different from baseline Area of Impairment: Safety/judgement;Problem solving                         Safety/Judgement: Decreased awareness of safety;Decreased awareness of deficits   Problem Solving: Requires verbal cues General Comments: poor adherance to NWB LLE precaution, requires repeated cuing for hop-to gait.      Exercises      General Comments        Pertinent Vitals/Pain Pain Assessment: 0-10 Pain Score: 10-Worst pain ever Pain Location: L foot Pain Descriptors / Indicators: Throbbing Pain Intervention(s): Limited activity within patient's tolerance;Monitored during session;Repositioned;Patient requesting pain meds-RN notified    Home Living  Prior Function            PT Goals (current goals can now be found in the care plan section) Acute Rehab PT Goals Patient Stated Goal: return home PT Goal Formulation: With patient Time For Goal Achievement: 02/18/21 Potential to Achieve Goals: Good Progress towards PT goals: Progressing toward goals    Frequency    Min 3X/week      PT Plan Current plan remains appropriate    Co-evaluation              AM-PAC PT "6 Clicks" Mobility   Outcome Measure  Help needed turning from your back to your side while in a flat bed without using bedrails?: None Help needed  moving from lying on your back to sitting on the side of a flat bed without using bedrails?: None Help needed moving to and from a bed to a chair (including a wheelchair)?: A Little Help needed standing up from a chair using your arms (e.g., wheelchair or bedside chair)?: A Little Help needed to walk in hospital room?: A Little Help needed climbing 3-5 steps with a railing? : A Little 6 Click Score: 20    End of Session Equipment Utilized During Treatment: Other (comment) (post op shoe) Activity Tolerance: Patient limited by pain Patient left: with call bell/phone within reach;in chair (pt states he will press call button and wait for assist prior to mobilizing back to bed, RN aware) Nurse Communication: Mobility status PT Visit Diagnosis: Other abnormalities of gait and mobility (R26.89);Pain Pain - Right/Left: Left Pain - part of body: Ankle and joints of foot     Time: 1453-1511 PT Time Calculation (min) (ACUTE ONLY): 18 min  Charges:  $Gait Training: 8-22 mins                     Marye Round, PT DPT Acute Rehabilitation Services Pager [phone]  Office [phone]   Truddie Coco 02/09/2021, 4:43 PM

## 2021-02-09 NOTE — H&P (View-Only) (Signed)
Patient ID: Brett White, male   DOB: 1961-07-06, 60 y.o.   MRN: [id number] Patient is seen in follow-up status post great toe amputation MTP joint left foot.  Patient states he still has sharp stabbing burning pain has nausea he states that his symptoms are getting worse despite IV antibiotics.  Examination there is cellulitis around the surgical incision that is tender to palpation there is no drainage.  Discussed that due to the increased pain systemic symptoms and cellulitis unresolved with antibiotics we should proceed with a left first ray amputation.  Patient states he understands wished to proceed at this time.  Plan for surgery tomorrow Friday.

## 2021-02-09 NOTE — Progress Notes (Signed)
Patient ID: Brett White, male   DOB: 03/01/1961, 59 y.o.   MRN: [id number] Patient is seen in follow-up status post great toe amputation MTP joint left foot.  Patient states he still has sharp stabbing burning pain has nausea he states that his symptoms are getting worse despite IV antibiotics.  Examination there is cellulitis around the surgical incision that is tender to palpation there is no drainage.  Discussed that due to the increased pain systemic symptoms and cellulitis unresolved with antibiotics we should proceed with a left first ray amputation.  Patient states he understands wished to proceed at this time.  Plan for surgery tomorrow Friday. 

## 2021-02-10 ENCOUNTER — Encounter (HOSPITAL_COMMUNITY)
Admission: EM | Disposition: A | Discharge status: Skilled Nursing Facility | Payer: Self-pay | Source: Home / Self Care | Attending: Internal Medicine

## 2021-02-10 ENCOUNTER — Inpatient Hospital Stay (HOSPITAL_COMMUNITY): Payer: Medicare HMO | Admitting: Certified Registered"

## 2021-02-10 ENCOUNTER — Encounter (HOSPITAL_COMMUNITY): Payer: Self-pay | Admitting: Internal Medicine

## 2021-02-10 DIAGNOSIS — M869 Osteomyelitis, unspecified: Secondary | ICD-10-CM | POA: Diagnosis not present

## 2021-02-10 HISTORY — PX: AMPUTATION: SHX166

## 2021-02-10 LAB — GLUCOSE, CAPILLARY
Glucose-Capillary: 158 mg/dL — ABNORMAL HIGH (ref 70–99)
Glucose-Capillary: 170 mg/dL — ABNORMAL HIGH (ref 70–99)
Glucose-Capillary: 160 mg/dL — ABNORMAL HIGH (ref 70–99)
Glucose-Capillary: 148 mg/dL — ABNORMAL HIGH (ref 70–99)
Glucose-Capillary: 219 mg/dL — ABNORMAL HIGH (ref 70–99)

## 2021-02-10 SURGERY — AMPUTATION, FOOT, RAY
Anesthesia: Monitor Anesthesia Care | Site: Toe | Laterality: Left

## 2021-02-10 MED ORDER — LIDOCAINE HCL (CARDIAC) PF 100 MG/5ML IV SOSY
PREFILLED_SYRINGE | INTRAVENOUS | Status: DC | PRN
  Administered 2021-02-10: 20 mg via INTRAVENOUS

## 2021-02-10 MED ORDER — LIDOCAINE 2% (20 MG/ML) 5 ML SYRINGE
INTRAMUSCULAR | Status: AC
  Filled 2021-02-10: qty 10

## 2021-02-10 MED ORDER — ROPIVACAINE HCL 5 MG/ML IJ SOLN
INTRAMUSCULAR | Status: DC | PRN
  Administered 2021-02-10: 25 mL via PERINEURAL

## 2021-02-10 MED ORDER — CHLORHEXIDINE GLUCONATE 0.12 % MT SOLN
OROMUCOSAL | Status: AC
  Administered 2021-02-10: 15 mL via OROMUCOSAL
  Filled 2021-02-10: qty 15

## 2021-02-10 MED ORDER — ENOXAPARIN SODIUM 40 MG/0.4ML IJ SOSY
40.0000 mg | PREFILLED_SYRINGE | INTRAMUSCULAR | Status: DC
  Administered 2021-02-11 – 2021-02-19 (×9): 40 mg via SUBCUTANEOUS
  Filled 2021-02-10 (×9): qty 0.4

## 2021-02-10 MED ORDER — MIDAZOLAM HCL 5 MG/5ML IJ SOLN
INTRAMUSCULAR | Status: DC | PRN
  Administered 2021-02-10: 2 mg via INTRAVENOUS

## 2021-02-10 MED ORDER — CHLORHEXIDINE GLUCONATE 0.12 % MT SOLN: 15.0000 mL | Freq: Once | OROMUCOSAL | Status: AC

## 2021-02-10 MED ORDER — PROPOFOL 10 MG/ML IV BOLUS
INTRAVENOUS | Status: AC
  Filled 2021-02-10: qty 20

## 2021-02-10 MED ORDER — SODIUM CHLORIDE 0.9 % IV SOLN: INTRAVENOUS | Status: DC

## 2021-02-10 MED ORDER — 0.9 % SODIUM CHLORIDE (POUR BTL) OPTIME
TOPICAL | Status: DC | PRN
  Administered 2021-02-10: 1000 mL

## 2021-02-10 MED ORDER — DEXMEDETOMIDINE (PRECEDEX) IN NS 20 MCG/5ML (4 MCG/ML) IV SYRINGE
PREFILLED_SYRINGE | INTRAVENOUS | Status: AC
  Filled 2021-02-10: qty 5

## 2021-02-10 MED ORDER — LACTATED RINGERS IV SOLN: INTRAVENOUS | Status: DC

## 2021-02-10 MED ORDER — FENTANYL CITRATE (PF) 100 MCG/2ML IJ SOLN
INTRAMUSCULAR | Status: DC | PRN
  Administered 2021-02-10: 50 ug via INTRAVENOUS

## 2021-02-10 MED ORDER — ORAL CARE MOUTH RINSE: 15.0000 mL | Freq: Once | OROMUCOSAL | Status: AC

## 2021-02-10 MED ORDER — PROPOFOL 10 MG/ML IV BOLUS
INTRAVENOUS | Status: DC | PRN
  Administered 2021-02-10 (×4): 10 mg via INTRAVENOUS
  Administered 2021-02-10: 20 mg via INTRAVENOUS
  Administered 2021-02-10: 10 mg via INTRAVENOUS
  Administered 2021-02-10: 20 mg via INTRAVENOUS

## 2021-02-10 MED ORDER — MIDAZOLAM HCL 2 MG/2ML IJ SOLN
INTRAMUSCULAR | Status: AC
  Filled 2021-02-10: qty 2

## 2021-02-10 MED ORDER — FENTANYL CITRATE (PF) 100 MCG/2ML IJ SOLN
INTRAMUSCULAR | Status: AC
  Filled 2021-02-10: qty 2

## 2021-02-10 MED ORDER — DEXMEDETOMIDINE (PRECEDEX) IN NS 20 MCG/5ML (4 MCG/ML) IV SYRINGE
PREFILLED_SYRINGE | INTRAVENOUS | Status: DC | PRN
  Administered 2021-02-10 (×4): 4 ug via INTRAVENOUS

## 2021-02-10 MED ORDER — CEFAZOLIN SODIUM-DEXTROSE 2-4 GM/100ML-% IV SOLN
INTRAVENOUS | Status: AC
  Filled 2021-02-10: qty 100

## 2021-02-10 MED ORDER — CEFAZOLIN SODIUM-DEXTROSE 2-4 GM/100ML-% IV SOLN
2.0000 g | INTRAVENOUS | Status: AC
  Administered 2021-02-10: 2 g via INTRAVENOUS

## 2021-02-10 MED ORDER — BUPIVACAINE HCL (PF) 0.5 % IJ SOLN
INTRAMUSCULAR | Status: AC
  Filled 2021-02-10: qty 30

## 2021-02-10 SURGICAL SUPPLY — 30 items
BLADE SAW SGTL MED 73X18.5 STR (BLADE) IMPLANT
BLADE SURG 21 STRL SS (BLADE) ×2 IMPLANT
BNDG COHESIVE 4X5 TAN STRL (GAUZE/BANDAGES/DRESSINGS) ×2 IMPLANT
BNDG GAUZE ELAST 4 BULKY (GAUZE/BANDAGES/DRESSINGS) ×2 IMPLANT
COVER SURGICAL LIGHT HANDLE (MISCELLANEOUS) ×4 IMPLANT
COVER WAND RF STERILE (DRAPES) ×2 IMPLANT
DRAPE INCISE IOBAN 66X45 STRL (DRAPES) ×1 IMPLANT
DRAPE U-SHAPE 47X51 STRL (DRAPES) ×4 IMPLANT
DRSG ADAPTIC 3X8 NADH LF (GAUZE/BANDAGES/DRESSINGS) ×2 IMPLANT
DRSG PAD ABDOMINAL 8X10 ST (GAUZE/BANDAGES/DRESSINGS) ×4 IMPLANT
DURAPREP 26ML APPLICATOR (WOUND CARE) ×2 IMPLANT
ELECT REM PT RETURN 9FT ADLT (ELECTROSURGICAL) ×2
ELECTRODE REM PT RTRN 9FT ADLT (ELECTROSURGICAL) ×1 IMPLANT
GAUZE SPONGE 4X4 12PLY STRL (GAUZE/BANDAGES/DRESSINGS) ×2 IMPLANT
GLOVE BIOGEL PI IND STRL 9 (GLOVE) ×1 IMPLANT
GLOVE BIOGEL PI INDICATOR 9 (GLOVE) ×1
GLOVE SURG ORTHO 9.0 STRL STRW (GLOVE) ×2 IMPLANT
GOWN STRL REUS W/ TWL XL LVL3 (GOWN DISPOSABLE) ×2 IMPLANT
GOWN STRL REUS W/TWL XL LVL3 (GOWN DISPOSABLE) ×4
KIT BASIN OR (CUSTOM PROCEDURE TRAY) ×2 IMPLANT
KIT DRSG PREVENA PLUS 7DAY 125 (MISCELLANEOUS) ×1 IMPLANT
KIT TURNOVER KIT B (KITS) ×2 IMPLANT
NS IRRIG 1000ML POUR BTL (IV SOLUTION) ×2 IMPLANT
PACK ORTHO EXTREMITY (CUSTOM PROCEDURE TRAY) ×2 IMPLANT
PAD ARMBOARD 7.5X6 YLW CONV (MISCELLANEOUS) ×4 IMPLANT
STOCKINETTE IMPERVIOUS LG (DRAPES) IMPLANT
SUT ETHILON 2 0 PSLX (SUTURE) ×2 IMPLANT
TOWEL GREEN STERILE (TOWEL DISPOSABLE) ×2 IMPLANT
TUBE CONNECTING 12X1/4 (SUCTIONS) ×2 IMPLANT
YANKAUER SUCT BULB TIP NO VENT (SUCTIONS) ×2 IMPLANT

## 2021-02-10 NOTE — Interval H&P Note (Signed)
History and Physical Interval Note:  02/10/2021 6:50 AM  Brett White  has presented today for surgery, with the diagnosis of Abscess status post left Great Toe Amputation.  The various methods of treatment have been discussed with the patient and family. After consideration of risks, benefits and other options for treatment, the patient has consented to  Procedure(s): LEFT FOOT 1ST RAY AMPUTATION (Left) as a surgical intervention.  The patient's history has been reviewed, patient examined, no change in status, stable for surgery.  I have reviewed the patient's chart and labs.  Questions were answered to the patient's satisfaction.     [name]

## 2021-02-10 NOTE — Op Note (Signed)
02/10/2021  2:57 PM  PATIENT:  Brett White    PRE-OPERATIVE DIAGNOSIS:  Abscess status post left Great Toe Amputation  POST-OPERATIVE DIAGNOSIS:  Same  PROCEDURE:  LEFT FOOT 1ST RAY AMPUTATION  SURGEON:  [name], MD  PHYSICIAN ASSISTANT:None ANESTHESIA:   General  PREOPERATIVE INDICATIONS:  Brett White is a  60 y.o. male with a diagnosis of Abscess status post left Great Toe Amputation who failed conservative measures and elected for surgical management.    The risks benefits and alternatives were discussed with the patient preoperatively including but not limited to the risks of infection, bleeding, nerve injury, cardiopulmonary complications, the need for revision surgery, among others, and the patient was willing to proceed.  OPERATIVE IMPLANTS: Praveena wound VAC 13 cm.  @ENCIMAGES @  OPERATIVE FINDINGS: Patient had destructive bony changes with purulence around the first metatarsal head.  The tissue margins were clear after resection of the first ray.  OPERATIVE PROCEDURE: Patient was brought the operating room after undergoing a regional anesthetic.  After adequate levels anesthesia were obtained patient's left lower extremity was prepped using DuraPrep draped into a sterile field a timeout was called.  A racquet incision was made around the cellulitic area over the great toe amputation the first ray was resected through the base of the first metatarsal electrocardio was used for hemostasis the wound was irrigated with normal saline.  Patient had destructive bony changes and purulence around the first metatarsal head.  There were no clinical signs of infection at the time of surgery of the amputation of the great toe MTP joint.  The tissue and the margins were clear after the first ray amputation good healthy bleeding petechial wound edges.  The incision was closed using 2-0 nylon.  A 13 cm Prevena wound VAC was applied this had a good suction fit overwrapped with Covan  from the metatarsal heads to the tibial tubercle.  Patient was taken to the PACU in stable condition.   DISCHARGE PLANNING:  Antibiotic duration: Continue antibiotics for 24 hours  Weightbearing: Touchdown weightbearing on the left  Pain medication: Opioid pathway  Dressing care/ Wound VAC: Continue wound VAC for 1 week  Ambulatory devices: Walker or crutches  Discharge to: Follow-up in the office in 1 week.  Follow-up: In the office 1 week post operative.

## 2021-02-10 NOTE — Anesthesia Preprocedure Evaluation (Signed)
Anesthesia Evaluation  Patient identified by MRN, date of birth, ID band Patient awake    Reviewed: Allergy & Precautions, H&P , NPO status , Patient's Chart, lab work & pertinent test results  Airway Mallampati: II   Neck ROM: full    Dental   Pulmonary neg pulmonary ROS,    breath sounds clear to auscultation       Cardiovascular hypertension,  Rhythm:regular Rate:Normal     Neuro/Psych    GI/Hepatic   Endo/Other  diabetes, Type 2  Renal/GU      Musculoskeletal   Abdominal   Peds  Hematology   Anesthesia Other Findings   Reproductive/Obstetrics                             Anesthesia Physical Anesthesia Plan  ASA: II  Anesthesia Plan: MAC and Regional   Post-op Pain Management:    Induction: Intravenous  PONV Risk Score and Plan: 1 and Propofol infusion, Midazolam and Treatment may vary due to age or medical condition  Airway Management Planned: Simple Face Mask  Additional Equipment:   Intra-op Plan:   Post-operative Plan:   Informed Consent: I have reviewed the patients History and Physical, chart, labs and discussed the procedure including the risks, benefits and alternatives for the proposed anesthesia with the patient or authorized representative who has indicated his/her understanding and acceptance.     Dental advisory given  Plan Discussed with: CRNA, Anesthesiologist and Surgeon  Anesthesia Plan Comments:         Anesthesia Quick Evaluation

## 2021-02-10 NOTE — Transfer of Care (Signed)
Immediate Anesthesia Transfer of Care Note  Patient: Brett White  Procedure(s) Performed: LEFT FOOT 1ST RAY AMPUTATION (Left Toe)  Patient Location: PACU  Anesthesia Type:MAC and Regional  Level of Consciousness: awake, alert  and oriented  Airway & Oxygen Therapy: Patient Spontanous Breathing  Post-op Assessment: Report given to RN and Post -op Vital signs reviewed and stable  Post vital signs: Reviewed and stable  Last Vitals:  Vitals Value Taken Time  BP 119/76 02/10/21 1442  Temp    Pulse 64 02/10/21 1447  Resp 7 02/10/21 1447  SpO2 95 % 02/10/21 1447  Vitals shown include unvalidated device data.  Last Pain:  Vitals:   02/10/21 1230  TempSrc:   PainSc: 8       Patients Stated Pain Goal: 4 (17/40/81 4481)  Complications: No complications documented.

## 2021-02-10 NOTE — Anesthesia Procedure Notes (Signed)
Anesthesia Regional Block: Popliteal block   Pre-Anesthetic Checklist: ,, timeout performed, Correct Patient, Correct Site, Correct Laterality, Correct Procedure, Correct Position, site marked, Risks and benefits discussed,  Surgical consent,  Pre-op evaluation,  At surgeon's request and post-op pain management  Laterality: Left  Prep: chloraprep       Needles:  Injection technique: Single-shot  Needle Type: Echogenic Stimulator Needle          Additional Needles:   Procedures:, nerve stimulator,,,,,,,   Nerve Stimulator or Paresthesia:  Response: plantar flexion of foot, 0.45 mA,   Additional Responses:   Narrative:  Start time: 02/10/2021 1:15 PM End time: 02/10/2021 1:25 PM Injection made incrementally with aspirations every 5 mL.  Performed by: Personally  Anesthesiologist: Achille Rich, MD  Additional Notes: Functioning IV was confirmed and monitors were applied.  A 56mm 21ga Arrow echogenic stimulator needle was used. Sterile prep and drape,hand hygiene and sterile gloves were used.  Negative aspiration and negative test dose prior to incremental administration of local anesthetic. The patient tolerated the procedure well.  Ultrasound guidance: relevent anatomy identified, needle position confirmed, local anesthetic spread visualized around nerve(s), vascular puncture avoided.  Image printed for medical record.

## 2021-02-10 NOTE — Progress Notes (Signed)
PROGRESS NOTE    Brett White  PGF:[id number] DOB: 05-11-1961 DOA: 02/02/2021 PCP: Patient, [name]   Brief Narrative: Brett White is a 60 y.o. male with history of diabetes mellitus type 2, history of CVA, diabetic neuropathy, hypertension.  Patient presented secondary to worsening left foot pain.  Patient was found to have evidence of osteomyelitis.  Empiric antibiotics.  Started and orthopedic surgery was consulted.  Patient underwent left great toe amputation on 5/7.  With continued pain, antibiotics were continued and patient is set for likely first ray amputation on 5/13.  02/10/2021: Patient underwent left foot first ray amputation today.  No new complaints.   Assessment & Plan:   Principal Problem:   Osteomyelitis of great toe of left foot (HCC) Active Problems:   Uncontrolled type 2 diabetes mellitus with hyperglycemia, without long-term current use of insulin (HCC)   Essential hypertension   Lactic acidosis   Diabetic peripheral neuropathy associated with type 2 diabetes mellitus (HCC)   Cellulitis of toe of left foot   Osteomyelitis (HCC)   Acute osteomyelitis of left great toe Left foot Cellulitis On admission, x-ray significant for bony destruction consistent with osteomyelitis confirmed on MRI. Empiric Vancomycin, Zosyn, Flagyl initiated. Zosyn replaced with Ceftriaxone. Ancef added as well. Antibiotics discontinued after surgery. Orthopedic surgery consulted and performed left great toe amputation on 5/7. Orthopedic surgery recommended resumption of IV antibiotics for continued foot pain with plan for possible first ray amputation on 5/13. -Orthopedic surgery recommendations: plan for surgery (left first ray amputation) on 5/13 -Continue Ceftazidime IV 02/10/2021: Status post left foot first ray amputation today.  Continue antibiotics for the next 24 hours as per the orthopedic surgery team.  Diabetes mellitus, type 2 Patient is on metformin as an  outpatient. Last hemoglobin A1C of 11.5% on admission. Currently on Lantus and SSI. Would likely benefit from insulin therapy on discharge with significantly elevated hemoglobin A1C. Fasting blood sugar of 204 this morning. It is possible his infection may be contributing to poorly controlled blood sugar at this time. -Continue Lantus 20 units daily; if fasting blood sugar continues to be elevated, may do better with a regimen of Lantus 30 units, close to ~0.3 units/kg -Continue SSI  Primary hypertension -Continue hydrochlorothiazide and lisinopril  Diabetic neuropathy -Continue gabapentin  Hyponatremia Sodium, urine and serum osmolality. Suspect secondary to volume depletion.    DVT prophylaxis: Lovenox Code Status:   Code Status: Full Code Family Communication: None at bedside Disposition Plan: Discharge home pending orthopedic surgery recommendations/management.   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 No nausea or vomiting. No fever or chills.    Objective: Vitals:   02/10/21 1320 02/10/21 1442 02/10/21 1457 02/10/21 1506  BP:  119/76 113/72 106/70  Pulse: 71 70 64 64  Resp:   10 12  Temp:  98 F (36.7 C)  98.3 F (36.8 C)  TempSrc:      SpO2: 100% 96% 97% 96%  Weight:      Height:        Intake/Output Summary (Last 24 hours) at 02/10/2021 1701 Last data filed at 02/10/2021 1511 Gross per 24 hour  Intake 450 ml  Output 125 ml  Net 325 ml   Filed Weights   02/02/21 2200  Weight: 97.5 kg  Examination:  General exam: Appears calm and comfortable Respiratory system: Clear to auscultation.  Cardiovascular system: S1 & S2 heard. Gastrointestinal system: Abdomen is nondistended, soft and nontender. No organomegaly or masses felt. Normal bowel  sounds heard. Central nervous system: Alert and oriented. No focal neurological deficits. Musculoskeletal: Left lower leg is wrapped.  Status post left foot first ray amputation today.  Wound VAC in place.  Data Reviewed: I have personally reviewed following labs and imaging studies  CBC Lab Results  Component Value Date   WBC 6.4 02/08/2021   RBC 4.49 02/08/2021   HGB 12.9 (L) 02/08/2021   HCT 38.9 (L) 02/08/2021   MCV 86.6 02/08/2021   MCH 28.7 02/08/2021   PLT 310 02/08/2021   MCHC 33.2 02/08/2021   RDW 11.9 02/08/2021   LYMPHSABS 1.7 02/08/2021   MONOABS 0.5 02/08/2021   EOSABS 0.2 02/08/2021   BASOSABS 0.0 66/29/4765     Last metabolic panel Lab Results  Component Value Date   NA 133 (L) 02/08/2021   K 4.3 02/08/2021   CL 95 (L) 02/08/2021   CO2 29 02/08/2021   BUN 22 (H) 02/08/2021   CREATININE 0.95 02/08/2021   GLUCOSE 211 (H) 02/08/2021   GFRNONAA >60 02/08/2021   CALCIUM 8.8 (L) 02/08/2021   PROT 7.2 02/03/2021   ALBUMIN 3.5 02/03/2021   BILITOT 0.7 02/03/2021   ALKPHOS 75 02/03/2021   AST 12 (L) 02/03/2021   ALT 9 02/03/2021   ANIONGAP 9 02/08/2021    CBG (last 3)  Recent Labs    02/10/21 0704 02/10/21 1240 02/10/21 1444  GLUCAP 158* 170* 160*     GFR: Estimated Creatinine Clearance: 102.5 mL/min (by C-G formula based on SCr of 0.95 mg/dL).  Coagulation Profile: No results for input(s): INR, PROTIME in the last 168 hours.  Recent Results (from the past 240 hour(s))  Culture, blood (Routine x 2)     Status: None   Collection Time: 02/02/21 10:17 PM   Specimen: BLOOD RIGHT HAND  Result Value Ref Range Status   Specimen Description BLOOD RIGHT HAND  Final   Special Requests AEROBIC BOTTLE ONLY Blood Culture adequate volume  Final   Culture   Final    NO GROWTH 5 DAYS Performed at [facility], [address], [place], [place] [address]    Report Status 02/07/2021 FINAL  Final  Culture, blood (Routine x 2)     Status: None    Collection Time: 02/02/21 10:20 PM   Specimen: BLOOD LEFT HAND  Result Value Ref Range Status   Specimen Description BLOOD LEFT HAND  Final   Special Requests   Final    BOTTLES DRAWN AEROBIC AND ANAEROBIC Blood Culture adequate volume   Culture   Final    NO GROWTH 5 DAYS Performed at [facility], [place] [address] [place], [place] [address]    Report Status 02/07/2021 FINAL  Final  Resp Panel by RT-PCR (Flu A&B, Covid) Nasopharyngeal Swab     Status: None   Collection Time: 02/03/21 12:26 PM   Specimen: Nasopharyngeal Swab; Nasopharyngeal(NP) swabs in vial transport medium  Result Value Ref Range Status   SARS Coronavirus 2 by RT PCR NEGATIVE NEGATIVE Final    Comment: (NOTE) SARS-CoV-2 target nucleic acids are NOT DETECTED.  The SARS-CoV-2 RNA is generally detectable in upper respiratory specimens during the acute phase of infection. The lowest concentration of SARS-CoV-2 viral copies this assay can detect is 138 copies/mL. A negative result does not  preclude SARS-Cov-2 infection and should not be used as the sole basis for treatment or other patient management decisions. A negative result may occur with  improper specimen collection/handling, submission of specimen other than nasopharyngeal swab, presence of viral mutation(s) within the areas targeted by this assay, and inadequate number of viral copies(<138 copies/mL). A negative result must be combined with clinical observations, patient history, and epidemiological information. The expected result is Negative.  Fact Sheet for Patients:  EntrepreneurPulse.com.au  Fact Sheet for Healthcare Providers:  IncredibleEmployment.be  This test is no t yet approved or cleared by the [place] FDA and  has been authorized for detection and/or diagnosis of SARS-CoV-2 by FDA under an Emergency Use Authorization (EUA). This EUA will remain  in effect (meaning this test can be used) for the  duration of the COVID-19 declaration under Section 564(b)(1) of the Act, 21 U.S.C.section 360bbb-3(b)(1), unless the authorization is terminated  or revoked sooner.       Influenza A by PCR NEGATIVE NEGATIVE Final   Influenza B by PCR NEGATIVE NEGATIVE Final    Comment: (NOTE) The Xpert Xpress SARS-CoV-2/FLU/RSV plus assay is intended as an aid in the diagnosis of influenza from Nasopharyngeal swab specimens and should not be used as a sole basis for treatment. Nasal washings and aspirates are unacceptable for Xpert Xpress SARS-CoV-2/FLU/RSV testing.  Fact Sheet for Patients: EntrepreneurPulse.com.au  Fact Sheet for Healthcare Providers: IncredibleEmployment.be  This test is not yet approved or cleared by the [place] FDA and has been authorized for detection and/or diagnosis of SARS-CoV-2 by FDA under an Emergency Use Authorization (EUA). This EUA will remain in effect (meaning this test can be used) for the duration of the COVID-19 declaration under Section 564(b)(1) of the Act, 21 U.S.C. section 360bbb-3(b)(1), unless the authorization is terminated or revoked.  Performed at [facility], [place] [address], [place], [place] [address]         Radiology Studies: No results found.      Scheduled Meds: . acidophilus  1 capsule Oral Daily  . vitamin C  1,000 mg Oral Daily  . docusate sodium  100 mg Oral Daily  . [START ON 02/11/2021] enoxaparin (LOVENOX) injection  40 mg Subcutaneous Q24H  . gabapentin  300 mg Oral QHS  . hydrochlorothiazide  12.5 mg Oral Daily  . insulin aspart  0-15 Units Subcutaneous TID AC & HS  . insulin glargine  20 Units Subcutaneous QHS  . insulin starter kit- pen needles  1 kit Other Once  . lisinopril  20 mg Oral Daily  . nutrition supplement (JUVEN)  1 packet Oral BID BM  . pantoprazole  40 mg Oral Daily  . senna-docusate  1 tablet Oral BID  . zinc sulfate  220 mg Oral Daily   Continuous  Infusions: . sodium chloride 75 mL/hr at 02/10/21 1635  . cefTAZidime (FORTAZ)  IV 2 g (02/10/21 1637)  . magnesium sulfate bolus IVPB       LOS: 7 days     Dana Allan, MD [facility] 02/10/2021, 5:01 PM  If 7PM-7AM, please contact night-coverage [email or web address]

## 2021-02-11 DIAGNOSIS — M869 Osteomyelitis, unspecified: Secondary | ICD-10-CM | POA: Diagnosis not present

## 2021-02-11 LAB — GLUCOSE, CAPILLARY
Glucose-Capillary: 279 mg/dL — ABNORMAL HIGH (ref 70–99)
Glucose-Capillary: 235 mg/dL — ABNORMAL HIGH (ref 70–99)
Glucose-Capillary: 190 mg/dL — ABNORMAL HIGH (ref 70–99)
Glucose-Capillary: 211 mg/dL — ABNORMAL HIGH (ref 70–99)

## 2021-02-11 LAB — OSMOLALITY: Osmolality: 292 mOsm/kg (ref 275–295)

## 2021-02-11 LAB — SODIUM, URINE, RANDOM: Sodium, Ur: 129 mmol/L

## 2021-02-11 LAB — OSMOLALITY, URINE: Osmolality, Ur: 709 mOsm/kg (ref 300–900)

## 2021-02-11 NOTE — Progress Notes (Signed)
PROGRESS NOTE    Macklin Jacquin  OXB:[id number] DOB: 24-Feb-1961 DOA: 02/02/2021 PCP: Patient, [name]   Brief Narrative: Dong Nimmons is a 60 y.o. male with history of diabetes mellitus type 2, history of CVA, diabetic neuropathy, hypertension.  Patient presented secondary to worsening left foot pain.  Patient was found to have evidence of osteomyelitis.  Empiric antibiotics.  Started and orthopedic surgery was consulted.  Patient underwent left great toe amputation on 5/7.  With continued pain, antibiotics were continued and patient is set for likely first ray amputation on 5/13.  02/10/2021: Patient underwent left foot first ray amputation today.  No new complaints. 02/11/2021: Postop day 1.  Orthopedic team is managing patient postoperatively.  Physical therapy team is recommending skilled nursing facility placement.  We will consult the transition of care team.  Otherwise, patient has no new complaints today.   Assessment & Plan:   Principal Problem:   Osteomyelitis of great toe of left foot (HCC) Active Problems:   Uncontrolled type 2 diabetes mellitus with hyperglycemia, without long-term current use of insulin (HCC)   Essential hypertension   Lactic acidosis   Diabetic peripheral neuropathy associated with type 2 diabetes mellitus (HCC)   Cellulitis of toe of left foot   Osteomyelitis (HCC)   Acute osteomyelitis of left great toe Left foot Cellulitis On admission, x-ray significant for bony destruction consistent with osteomyelitis confirmed on MRI. Empiric Vancomycin, Zosyn, Flagyl initiated. Zosyn replaced with Ceftriaxone. Ancef added as well. Antibiotics discontinued after surgery. Orthopedic surgery consulted and performed left great toe amputation on 5/7. Orthopedic surgery recommended resumption of IV antibiotics for continued foot pain with plan for possible first ray amputation on 5/13. -Orthopedic surgery recommendations: plan for surgery (left first ray  amputation) on 5/13 -Continue Ceftazidime IV 02/10/2021: Status post left foot first ray amputation today.  Continue antibiotics for the next 24 hours as per the orthopedic surgery team. 02/11/2021: Postop day 1.  Orthopedic team is managing care.  Patient be discharged to skilled nursing facility.  Diabetes mellitus, type 2 Patient is on metformin as an outpatient. Last hemoglobin A1C of 11.5% on admission. Currently on Lantus and SSI. Would likely benefit from insulin therapy on discharge with significantly elevated hemoglobin A1C. Fasting blood sugar of 204 this morning. It is possible his infection may be contributing to poorly controlled blood sugar at this time. 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    Restrict fluids to 1500 cc per 24 hours.    DVT prophylaxis: Lovenox Code Status:   Code Status: Full Code Family Communication: None at bedside Disposition Plan: Discharge home pending orthopedic surgery recommendations/management.   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 No nausea or vomiting. No fever or chills.    Objective: Vitals:   02/10/21 2100 02/11/21 0800 02/11/21 0900 02/11/21 1459  BP: 132/78  (!) 155/94 140/75  Pulse: 84  87 87  Resp: $Remo'15 18 18 18  'DnxtH$ Temp: 98.7 F (37.1 C)  98 F (36.7 C)  TempSrc: Oral     SpO2: 98%  97% 95%  Weight:      Height:        Intake/Output Summary (Last 24 hours)  at 02/11/2021 1644 Last data filed at 02/11/2021 1453 Gross per 24 hour  Intake 1234.5 ml  Output 1900 ml  Net -665.5 ml   Filed Weights   02/02/21 2200  Weight: 97.5 kg    Examination:  General exam: Appears calm and comfortable Respiratory system: Clear to auscultation.  Cardiovascular system: S1 & S2 heard. Gastrointestinal system: Abdomen is nondistended, soft and nontender. No organomegaly or masses felt. Normal bowel sounds heard. Central nervous system: Alert and oriented. No focal neurological deficits. Musculoskeletal: Left lower leg is wrapped.  Status post left foot first ray amputation today.  Wound VAC in place.  Data Reviewed: I have personally reviewed following labs and imaging studies  CBC Lab Results  Component Value Date   WBC 6.4 02/08/2021   RBC 4.49 02/08/2021   HGB 12.9 (L) 02/08/2021   HCT 38.9 (L) 02/08/2021   MCV 86.6 02/08/2021   MCH 28.7 02/08/2021   PLT 310 02/08/2021   MCHC 33.2 02/08/2021   RDW 11.9 02/08/2021   LYMPHSABS 1.7 02/08/2021   MONOABS 0.5 02/08/2021   EOSABS 0.2 02/08/2021   BASOSABS 0.0 97/98/9211     Last metabolic panel Lab Results  Component Value Date   NA 133 (L) 02/08/2021   K 4.3 02/08/2021   CL 95 (L) 02/08/2021   CO2 29 02/08/2021   BUN 22 (H) 02/08/2021   CREATININE 0.95 02/08/2021   GLUCOSE 211 (H) 02/08/2021   GFRNONAA >60 02/08/2021   CALCIUM 8.8 (L) 02/08/2021   PROT 7.2 02/03/2021   ALBUMIN 3.5 02/03/2021   BILITOT 0.7 02/03/2021   ALKPHOS 75 02/03/2021   AST 12 (L) 02/03/2021   ALT 9 02/03/2021   ANIONGAP 9 02/08/2021    CBG (last 3)  Recent Labs    02/11/21 0708 02/11/21 1148 02/11/21 1535  GLUCAP 279* 235* 190*     GFR: Estimated Creatinine Clearance: 102.5 mL/min (by C-G formula based on SCr of 0.95 mg/dL).  Coagulation Profile: No results for input(s): INR, PROTIME in the last 168 hours.  Recent Results (from the past 240 hour(s))  Culture, blood (Routine x 2)     Status: None    Collection Time: 02/02/21 10:17 PM   Specimen: BLOOD RIGHT HAND  Result Value Ref Range Status   Specimen Description BLOOD RIGHT HAND  Final   Special Requests AEROBIC BOTTLE ONLY Blood Culture adequate volume  Final   Culture   Final    NO GROWTH 5 DAYS Performed at [facility], [address] [place], [place] [address]    Report Status 02/07/2021 FINAL  Final  Culture, blood (Routine x 2)     Status: None   Collection Time: 02/02/21 10:20 PM   Specimen: BLOOD LEFT HAND  Result Value Ref Range Status   Specimen Description BLOOD LEFT HAND  Final   Special Requests   Final    BOTTLES DRAWN AEROBIC AND ANAEROBIC Blood Culture adequate volume   Culture   Final    NO GROWTH 5 DAYS Performed at [facility], [place] [address], [place], [place] [address]    Report Status 02/07/2021 FINAL  Final  Resp Panel by RT-PCR (Flu A&B, Covid) Nasopharyngeal Swab     Status: None   Collection Time: 02/03/21 12:26 PM  Specimen: Nasopharyngeal Swab; Nasopharyngeal(NP) swabs in vial transport medium  Result Value Ref Range Status   SARS Coronavirus 2 by RT PCR NEGATIVE NEGATIVE Final    Comment: (NOTE) SARS-CoV-2 target nucleic acids are NOT DETECTED.  The SARS-CoV-2 RNA is generally detectable in upper respiratory specimens during the acute phase of infection. The lowest concentration of SARS-CoV-2 viral copies this assay can detect is 138 copies/mL. A negative result does not preclude SARS-Cov-2 infection and should not be used as the sole basis for treatment or other patient management decisions. A negative result may occur with  improper specimen collection/handling, submission of specimen other than nasopharyngeal swab, presence of viral mutation(s) within the areas targeted by this assay, and inadequate number of viral copies(<138 copies/mL). A negative result must be combined with clinical observations, patient history, and epidemiological information. The expected result  is Negative.  Fact Sheet for Patients:  EntrepreneurPulse.com.au  Fact Sheet for Healthcare Providers:  IncredibleEmployment.be  This test is no t yet approved or cleared by the [place] FDA and  has been authorized for detection and/or diagnosis of SARS-CoV-2 by FDA under an Emergency Use Authorization (EUA). This EUA will remain  in effect (meaning this test can be used) for the duration of the COVID-19 declaration under Section 564(b)(1) of the Act, 21 U.S.C.section 360bbb-3(b)(1), unless the authorization is terminated  or revoked sooner.       Influenza A by PCR NEGATIVE NEGATIVE Final   Influenza B by PCR NEGATIVE NEGATIVE Final    Comment: (NOTE) The Xpert Xpress SARS-CoV-2/FLU/RSV plus assay is intended as an aid in the diagnosis of influenza from Nasopharyngeal swab specimens and should not be used as a sole basis for treatment. Nasal washings and aspirates are unacceptable for Xpert Xpress SARS-CoV-2/FLU/RSV testing.  Fact Sheet for Patients: EntrepreneurPulse.com.au  Fact Sheet for Healthcare Providers: IncredibleEmployment.be  This test is not yet approved or cleared by the [place] FDA and has been authorized for detection and/or diagnosis of SARS-CoV-2 by FDA under an Emergency Use Authorization (EUA). This EUA will remain in effect (meaning this test can be used) for the duration of the COVID-19 declaration under Section 564(b)(1) of the Act, 21 U.S.C. section 360bbb-3(b)(1), unless the authorization is terminated or revoked.  Performed at [facility], [place] [address] [place], [place] [address]         Radiology Studies: No results found.      Scheduled Meds: . acidophilus  1 capsule Oral Daily  . vitamin C  1,000 mg Oral Daily  . docusate sodium  100 mg Oral Daily  . enoxaparin (LOVENOX) injection  40 mg Subcutaneous Q24H  . gabapentin  300 mg Oral QHS   . hydrochlorothiazide  12.5 mg Oral Daily  . insulin aspart  0-15 Units Subcutaneous TID AC & HS  . insulin glargine  20 Units Subcutaneous QHS  . insulin starter kit- pen needles  1 kit Other Once  . lisinopril  20 mg Oral Daily  . nutrition supplement (JUVEN)  1 packet Oral BID BM  . pantoprazole  40 mg Oral Daily  . senna-docusate  1 tablet Oral BID  . zinc sulfate  220 mg Oral Daily   Continuous Infusions: . sodium chloride 75 mL/hr at 02/11/21 1453  . cefTAZidime (FORTAZ)  IV 2 g (02/11/21 0852)  . magnesium sulfate bolus IVPB       LOS: 8 days     Dana Allan, MD [facility] 02/11/2021, 4:44 PM  If 7PM-7AM,  please contact night-coverage [email or web address]

## 2021-02-11 NOTE — Plan of Care (Signed)
  Problem: Education: Goal: Knowledge of General Education information will improve Description: Including pain rating scale, medication(s)/side effects and non-pharmacologic comfort measures Outcome: Progressing   Problem: Activity: Goal: Risk for activity intolerance will decrease Outcome: Progressing   Problem: Nutrition: Goal: Adequate nutrition will be maintained Outcome: Progressing   

## 2021-02-11 NOTE — Progress Notes (Signed)
Patient ID: Bobak Oguinn, male   DOB: 01/26/1961, 60 y.o.   MRN: [id number] Patient is postoperative day 1 left foot first ray amputation.  Patient did have an abscess and destructive bony changes of the first metatarsal head.  The first ray was resected margins were clear.  The wound VAC was applied there is no drainage in the wound VAC canister.  The wound VAC was not plugged in.  This wound VAC needs to be plugged in when patient is not up ambulating.  Minimize weightbearing on the left foot.  Patient may discharge once he is safe with therapy.  I will follow-up in the office in 1 week continue wound VAC until that time.

## 2021-02-11 NOTE — Progress Notes (Signed)
Physical Therapy Treatment Patient Details Name: Brett White MRN: [id number] DOB: [birth year] Today's Date: 02/11/2021    History of Present Illness The pt is a 60 yo male presenting 5/5 due to non healing wound on L toe, is now s/p amputation of L great toe due to osteomyelitis on 5/7. Pt awaiting 5th ray amputation 5/13. PMH includes:  DM II with nuropathy and prior back surgery.    PT Comments    Pt was seen for mobility on RW with dense cues to maintain NWB on LLE.  Pt has numbness on  L foot and cannot distinguish when he is stepping on it.  Pt asked about a rehab placement as he is feeling more restricted to move and walk, and has apparently a cluttered home with roommates that hinder him to move safely.  Follow for acute PT goals, focusing on his ability to maintain NWB in standing on LLE and to decrease balance losses with movement.     Follow Up Recommendations  SNF     Equipment Recommendations  Rolling walker with 5" wheels    Recommendations for Other Services       Precautions / Restrictions Precautions Precautions: Fall Required Braces or Orthoses: Other Brace Other Brace: post-op shoe - pt initially declines but dons with PT prompting Restrictions Weight Bearing Restrictions: Yes LLE Weight Bearing: Non weight bearing    Mobility  Bed Mobility Overal bed mobility: Modified Independent                  Transfers Overall transfer level: Needs assistance Equipment used: Rolling walker (2 wheeled) Transfers: Sit to/from Stand Sit to Stand: Min guard         General transfer comment: cued placement of L foot to maintain NWB  Ambulation/Gait Ambulation/Gait assistance: Min guard Gait Distance (Feet): 50 Feet Assistive device: Rolling walker (2 wheeled)   Gait velocity: reduced   General Gait Details: walking on RLE wth touch down on LLE and reminders to be NWB if possible   Stairs             Wheelchair Mobility    Modified Rankin  (Stroke Patients Only)       Balance Overall balance assessment: Mild deficits observed, not formally tested                                          Cognition Arousal/Alertness: Awake/alert Behavior During Therapy: WFL for tasks assessed/performed Overall Cognitive Status: Within Functional Limits for tasks assessed                                 General Comments: Pt required cues on positioning of LE to be able to follow WB status      Exercises      General Comments General comments (skin integrity, edema, etc.): pt is up walking and noted his control of L foot WB requires cued intervention.  Pt is interested in getting a rehab placement as he is home wiht no practical help, and requires dense cues due to trying to overdo WB      Pertinent Vitals/Pain Pain Assessment: Faces Faces Pain Scale: Hurts even more Pain Location: L foot Pain Descriptors / Indicators: Operative site guarding Pain Intervention(s): Limited activity within patient's tolerance;Monitored during session;Premedicated before session;Repositioned    Home Living  Prior Function            PT Goals (current goals can now be found in the care plan section) Acute Rehab PT Goals Patient Stated Goal: return home    Frequency    Min 3X/week      PT Plan Discharge plan needs to be updated    Co-evaluation              AM-PAC PT "6 Clicks" Mobility   Outcome Measure  Help needed turning from your back to your side while in a flat bed without using bedrails?: A Little Help needed moving from lying on your back to sitting on the side of a flat bed without using bedrails?: A Little Help needed moving to and from a bed to a chair (including a wheelchair)?: A Little Help needed standing up from a chair using your arms (e.g., wheelchair or bedside chair)?: A Little Help needed to walk in hospital room?: A Lot Help needed climbing 3-5  steps with a railing? : Total 6 Click Score: 15    End of Session Equipment Utilized During Treatment: Gait belt;Other (comment) (shoe) Activity Tolerance: Patient limited by pain Patient left: with call bell/phone within reach;in chair Nurse Communication: Mobility status PT Visit Diagnosis: Other abnormalities of gait and mobility (R26.89);Pain Pain - Right/Left: Left Pain - part of body: Ankle and joints of foot     Time: 2951-8841 PT Time Calculation (min) (ACUTE ONLY): 26 min  Charges:  $Gait Training: 8-22 mins $Therapeutic Activity: 8-22 mins                    Ivar Drape 02/11/2021, 1:42 PM Samul Dada, PT MS Acute Rehab Dept. Number: [facility] [phone] and [facility] [phone]

## 2021-02-11 NOTE — Evaluation (Signed)
Occupational Therapy Evaluation Patient Details Name: Brett White MRN: [id number] DOB: 06-19-1961 Today's Date: 02/11/2021    History of Present Illness The pt is a 60 yo male presenting 5/5 due to non healing wound on L toe, is now s/p amputation of L great toe due to osteomyelitis on 5/7. Pt awaiting 5th ray amputation 5/13. PMH includes:  DM II with nuropathy and prior back surgery.   Clinical Impression   Pt s/p L great toe amputation . Pt reported they live alone but then they live on the a mattress in the home on the floor but they can sleep on the couch with returning to go home. Pt at this time post set up was able to complete all UE dressing/bathing. Pt requires cues on positioning with sit to stand transfers and WB status at this time whenever completing any standing ADLS. Pt would report they are not putting weight onto LE but was on the ground and was reminded in session about WB status. Pt required min guard to min assist due to following cues.  Pt currently with functional limitations due to the deficits listed below (see OT Problem List).  Pt will benefit from skilled OT to increase their safety and independence with ADL and functional mobility for ADL to facilitate discharge to venue listed below.       Follow Up Recommendations  Home health OT;Supervision - Intermittent    Equipment Recommendations  Tub/shower bench;3 in 1 bedside commode    Recommendations for Other Services       Precautions / Restrictions Precautions Precautions: Fall Required Braces or Orthoses: Other Brace Other Brace: post-op shoe - pt initially declines but dons with PT prompting Restrictions Weight Bearing Restrictions: Yes LLE Weight Bearing: Non weight bearing      Mobility Bed Mobility Overal bed mobility: Modified Independent                  Transfers Overall transfer level: Needs assistance Equipment used: Rolling walker (2 wheeled) Transfers: Sit to/from Stand Sit to  Stand: Supervision         General transfer comment: pt cues on positioning    Balance Overall balance assessment: Mild deficits observed, not formally tested                                         ADL either performed or assessed with clinical judgement   ADL Overall ADL's : Needs assistance/impaired Eating/Feeding: Set up;Sitting   Grooming: Wash/dry hands;Wash/dry face;Set up;Sitting   Upper Body Bathing: Set up;Sitting   Lower Body Bathing: Minimal assistance;Sit to/from stand;Cueing for safety;Cueing for sequencing   Upper Body Dressing : Set up;Sitting   Lower Body Dressing: Set up;Minimal assistance;Sit to/from stand   Toilet Transfer: Minimal assistance;Cueing for safety;Cueing for sequencing;Regular Toilet   Toileting- Clothing Manipulation and Hygiene: Minimal assistance;Cueing for safety;Cueing for sequencing;Sit to/from stand   Tub/ Shower Transfer: Minimal assistance;Cueing for safety;Cueing for sequencing;Tub bench   Functional mobility during ADLs: Min guard;Cueing for safety;Cueing for sequencing;Rolling walker General ADL Comments: Pt at this time requires cues on WB status of LE     Vision         Perception Perception Perception Tested?: No   Praxis      Pertinent Vitals/Pain Pain Assessment: Faces Faces Pain Scale: Hurts whole lot Pain Descriptors / Indicators: Throbbing Pain Intervention(s): Limited activity within patient's tolerance;Monitored during session;Repositioned  Hand Dominance Right   Extremity/Trunk Assessment Upper Extremity Assessment Upper Extremity Assessment: Overall WFL for tasks assessed   Lower Extremity Assessment Lower Extremity Assessment: Defer to PT evaluation   Cervical / Trunk Assessment Cervical / Trunk Assessment: Normal   Communication Communication Communication: No difficulties   Cognition Arousal/Alertness: Awake/alert Behavior During Therapy: WFL for tasks  assessed/performed Overall Cognitive Status: Within Functional Limits for tasks assessed                                 General Comments: Pt required cues on positioning of LE to be able to follow WB status   General Comments       Exercises     Shoulder Instructions      Home Living Family/patient expects to be discharged to:: Private residence Living Arrangements: Alone Available Help at Discharge: Friend(s);Available PRN/intermittently Type of Home: Apartment Home Access: Level entry     Home Layout: One level     Bathroom Shower/Tub: Tub/shower unit;Curtain   Firefighter: Standard Bathroom Accessibility: Yes How Accessible: Accessible via walker            Prior Functioning/Environment Level of Independence: Independent        Comments: independent but limited by pain. 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        OT Problem List: Decreased strength;Decreased activity tolerance;Decreased safety awareness;Decreased knowledge of use of DME or AE;Pain      OT Treatment/Interventions:      OT Goals(Current goals can be found in the care plan section) Acute Rehab OT Goals Patient Stated Goal: return home OT Goal Formulation: With patient Time For Goal Achievement: 02/25/21 Potential to Achieve Goals: Good ADL Goals Pt Will Perform Lower Body Bathing: with modified independence;sit to/from stand Pt Will Perform Lower Body Dressing: with modified independence;sit to/from stand Pt Will Transfer to Toilet: with modified independence;ambulating Pt Will Perform Tub/Shower Transfer: with modified independence;tub bench  OT Frequency: Min 2X/week   Barriers to D/C:            Co-evaluation              AM-PAC OT "6 Clicks" Daily Activity     Outcome Measure Help from another person eating meals?: None Help from another person taking care of personal grooming?: None Help from another person toileting, which  includes using toliet, bedpan, or urinal?: A Little Help from another person bathing (including washing, rinsing, drying)?: A Little Help from another person to put on and taking off regular upper body clothing?: None Help from another person to put on and taking off regular lower body clothing?: A Little 6 Click Score: 21   End of Session Equipment Utilized During Treatment: Gait belt;Rolling walker  Activity Tolerance: Patient tolerated treatment well Patient left: in bed;with call bell/phone within reach;with bed alarm set  OT Visit Diagnosis: Unsteadiness on feet (R26.81);Other abnormalities of gait and mobility (R26.89);Pain Pain - Right/Left: Left Pain - part of body: Ankle and joints of foot                Time: 8828-0034 OT Time Calculation (min): 48 min Charges:  OT General Charges $OT Visit: 1 Visit OT Evaluation $OT Eval Low Complexity: 1 Low OT Treatments $Self Care/Home Management : 23-37 mins  Alphia Moh OTR/L  Acute Rehab Services  [phone] office number [phone] pager number   Alphia Moh 02/11/2021, 11:28  AM

## 2021-02-11 NOTE — NC FL2 (Signed)
[place] LEVEL OF CARE SCREENING TOOL     IDENTIFICATION  Patient Name: Brett White Birthdate: 1960/12/04 Sex: male Admission Date (Current Location): 02/02/2021  [facility] and [place] Number:  Herbalist and Address:  [facility], [place] [address], [place], [place] [address]      Provider Number: [id number]  Attending Physician Name and Address:  Bonnell Public, MD  Relative Name and Phone Number:       Current Level of Care: Hospital Recommended Level of Care: [place] Prior Approval Number:    Date Approved/Denied:   PASRR Number: [id number]  Discharge Plan: SNF    Current Diagnoses: Patient Active Problem List   Diagnosis Date Noted  . Osteomyelitis of great toe of left foot ([place]) 02/03/2021  . Uncontrolled type 2 diabetes mellitus with hyperglycemia, without long-term current use of insulin ([place]) 02/03/2021  . Essential hypertension 02/03/2021  . Lactic acidosis 02/03/2021  . Diabetic peripheral neuropathy associated with type 2 diabetes mellitus ([place]) 02/03/2021  . Osteomyelitis ([place]) 02/03/2021  . Cellulitis of toe of left foot     Orientation RESPIRATION BLADDER Height & Weight     Self,Time,Situation,Place  Normal Continent Weight: 214 lb 15.2 oz (97.5 kg) Height:  6' 3" (190.5 cm)  BEHAVIORAL SYMPTOMS/MOOD NEUROLOGICAL BOWEL NUTRITION STATUS      Continent Diet (See DC summary)  AMBULATORY STATUS COMMUNICATION OF NEEDS Skin   Limited Assist Verbally Surgical wounds (Surgical wounds on L foot and Great toe amputation)                       Personal Care Assistance Level of Assistance  Bathing,Feeding,Dressing Bathing Assistance: Limited assistance Feeding assistance: Independent Dressing Assistance: Limited assistance     Functional Limitations Info  Sight,Hearing,Speech Sight Info: Adequate Hearing Info: Adequate Speech Info: Adequate    SPECIAL CARE FACTORS FREQUENCY   PT (By licensed PT),OT (By licensed OT)     PT Frequency: 3x week OT Frequency: 3x week            Contractures Contractures Info: Not present    Additional Factors Info  Code Status,Allergies,Insulin Sliding Scale Code Status Info: Full Allergies Info: NKA   Insulin Sliding Scale Info: Insulin Aspart (Novolog) 0-15 U 3x a day w/ meals and at bedtime. Insulin Glargine (Lantus) 20 U @ bedtime       Current Medications (02/11/2021):  This is the current hospital active medication list Current Facility-Administered Medications  Medication Dose Route Frequency Provider Last Rate Last Admin  . 0.9 %  sodium chloride infusion   Intravenous Continuous Persons, Bevely Palmer, [place] 75 mL/hr at 02/11/21 1453 Infusion Verify at 02/11/21 1453  . acetaminophen (TYLENOL) tablet 325-650 mg  325-650 mg Oral Q6H PRN Persons, Bevely Palmer, PA      . acidophilus (RISAQUAD) capsule 1 capsule  1 capsule Oral Daily Persons, Bevely Palmer, PA   1 capsule at 02/11/21 1012  . alum & mag hydroxide-simeth (MAALOX/MYLANTA) 200-200-20 MG/5ML suspension 15-30 mL  15-30 mL Oral Q2H PRN Persons, Bevely Palmer, PA      . ascorbic acid (VITAMIN C) tablet 1,000 mg  1,000 mg Oral Daily Persons, Bevely Palmer, PA   1,000 mg at 02/11/21 1012  . bisacodyl (DULCOLAX) EC tablet 5 mg  5 mg Oral Daily PRN Persons, Bevely Palmer, PA      . cefTAZidime (FORTAZ) 2 g in sodium chloride 0.9 % 100 mL IVPB  2  g Intravenous Q8H Newt Minion, MD 200 mL/hr at 02/11/21 0852 2 g at 02/11/21 0852  . diphenhydrAMINE (BENADRYL) capsule 25 mg  25 mg Oral QHS PRN Persons, Bevely Palmer, PA   25 mg at 02/10/21 2039  . docusate sodium (COLACE) capsule 100 mg  100 mg Oral Daily Persons, Bevely Palmer, PA   100 mg at 02/11/21 1012  . enoxaparin (LOVENOX) injection 40 mg  40 mg Subcutaneous Q24H Persons, Bevely Palmer, PA   40 mg at 02/11/21 1011  . gabapentin (NEURONTIN) capsule 300 mg  300 mg Oral QHS Persons, Bevely Palmer, PA   300 mg at 02/10/21 2030  .  guaiFENesin-dextromethorphan (ROBITUSSIN DM) 100-10 MG/5ML syrup 15 mL  15 mL Oral Q4H PRN Persons, Bevely Palmer, PA      . hydrALAZINE (APRESOLINE) injection 5 mg  5 mg Intravenous Q20 Min PRN Persons, Bevely Palmer, [place]   5 mg at 02/06/21 2011  . hydrochlorothiazide (MICROZIDE) capsule 12.5 mg  12.5 mg Oral Daily Persons, Bevely Palmer, PA   12.5 mg at 02/11/21 1012  . HYDROmorphone (DILAUDID) injection 1 mg  1 mg Intravenous Q2H PRN Persons, Bevely Palmer, PA   1 mg at 02/11/21 1343  . insulin aspart (novoLOG) injection 0-15 Units  0-15 Units Subcutaneous TID AC & HS Persons, Bevely Palmer, PA   8 Units at 02/11/21 1158  . insulin glargine (LANTUS) injection 20 Units  20 Units Subcutaneous QHS Persons, Bevely Palmer, [place]   20 Units at 02/10/21 2130  . insulin starter kit- pen needles (English) 1 kit  1 kit Other Once Persons, Bevely Palmer, [place]      . labetalol (NORMODYNE) injection 10 mg  10 mg Intravenous Q10 min PRN Persons, Bevely Palmer, PA      . lisinopril (ZESTRIL) tablet 20 mg  20 mg Oral Daily Persons, Bevely Palmer, PA   20 mg at 02/11/21 1012  . magnesium citrate solution 1 Bottle  1 Bottle Oral Once PRN Persons, Bevely Palmer, PA      . magnesium sulfate IVPB 2 g 50 mL  2 g Intravenous Daily PRN Persons, Bevely Palmer, PA      . metoprolol tartrate (LOPRESSOR) injection 2-5 mg  2-5 mg Intravenous Q2H PRN Persons, Bevely Palmer, PA      . nutrition supplement (JUVEN) (JUVEN) powder packet 1 packet  1 packet Oral BID BM Persons, Bevely Palmer, [place]   1 packet at 02/11/21 1337  . ondansetron (ZOFRAN) injection 4 mg  4 mg Intravenous Q6H PRN Persons, Bevely Palmer, PA   4 mg at 02/08/21 2149  . oxyCODONE (Oxy IR/ROXICODONE) immediate release tablet 10-15 mg  10-15 mg Oral Q4H PRN Persons, Bevely Palmer, PA   15 mg at 02/11/21 1532  . oxyCODONE (Oxy IR/ROXICODONE) immediate release tablet 5-10 mg  5-10 mg Oral Q4H PRN Persons, Bevely Palmer, PA      . oxyCODONE-acetaminophen (PERCOCET/ROXICET) 5-325 MG per tablet 1-2 tablet  1-2 tablet Oral Q4H PRN  Persons, Bevely Palmer, [place]   2 tablet at 02/06/21 1217  . pantoprazole (PROTONIX) EC tablet 40 mg  40 mg Oral Daily Persons, Bevely Palmer, PA   40 mg at 02/11/21 1012  . phenol (CHLORASEPTIC) mouth spray 1 spray  1 spray Mouth/Throat PRN Persons, Bevely Palmer, PA      . polyethylene glycol (MIRALAX / GLYCOLAX) packet 17 g  17 g Oral Daily PRN Persons, Bevely Palmer, PA      . potassium chloride SA (KLOR-CON) CR tablet  20-40 mEq  20-40 mEq Oral Daily PRN Persons, Bevely Palmer, PA      . senna-docusate (Senokot-S) tablet 1 tablet  1 tablet Oral BID Persons, Bevely Palmer, [place]   1 tablet at 02/11/21 1012  . zinc sulfate capsule 220 mg  220 mg Oral Daily Persons, Bevely Palmer, [place]   220 mg at 02/11/21 1012     Discharge Medications: Please see discharge summary for a list of discharge medications.  Relevant Imaging Results:  Relevant Lab Results:   Additional Information SS# [place], [place]

## 2021-02-11 NOTE — TOC Initial Note (Addendum)
Transition of Care [facility]) - Initial/Assessment Note    Patient Details  Name: Brett White MRN: [id number] Date of Birth: 10-08-1960  Transition [facility]) CM/SW Contact:    Bess Kinds, RN Phone Number: [phone] 02/11/2021, 2:41 PM  Clinical Narrative:                  Request from nursing to speak to patient regarding transition planning. Patient stated that he cannot return to previous apartment - he could not recall the address[address] He stated that it is a crack house. He does not use. He stated that he left [place] about 6 months with a friend who stated [place] would be better, but then she left him at the home of her ex and returned to [place]. Patient stated that he gets a disability check on the 4th Wednesday of the month, and his intention is to go to a hotel when he gets his next check, but he has no funds currently.   Verified address listed in Epic. He stated that this address is for a friend that he uses for his SSI check. His friend already has too many people living in the home and there is no room for him even short term.   He stated that one doctor said he can discharge, but another said he needs a few more days of antibiotics. Discussed the difference between being cleared from one perspective, but not ready from another.   Patient stated that he can barely walk.   He stated he doesn't feel safe returning to his apartment where he sleeps on a mattress on the floor.   Discussed recommendations from PT for rehab. Patient is agreeable. Reviewed process, medicare guidelines, and insurance authorization. Patient agreeable to initiating SNF process. CSW to assist.   TOC following for transition needs.  Expected Discharge Plan: Skilled Nursing Facility Barriers to Discharge: Continued Medical Work up   Patient Goals and CMS Choice Patient states their goals for this hospitalization and ongoing recovery are:: to get better CMS Medicare.gov Compare Post Acute  Care list provided to:: Patient Choice offered to / list presented to : Patient  Expected Discharge Plan and Services Expected Discharge Plan: Skilled Nursing Facility In-house Referral: Clinical Social Work Discharge Planning Services: CM Consult Post Acute Care Choice: Skilled Nursing Facility Living arrangements for the past 2 months: Apartment                 DME Arranged: N/A DME Agency: NA       HH Arranged: NA HH Agency: NA        Prior Living Arrangements/Services Living arrangements for the past 2 months: Apartment Lives with:: Self Patient language and need for interpreter reviewed:: Yes Do you feel safe going back to the place where you live?: No   stated it is a crack house  Need for Family Participation in Patient Care: No (Comment)     Criminal Activity/Legal Involvement Pertinent to Current Situation/Hospitalization: No - Comment as needed  Activities of Daily Living Home Assistive Devices/Equipment: None ADL Screening (condition at time of admission) Patient's cognitive ability adequate to safely complete daily activities?: Yes Is the patient deaf or have difficulty hearing?: No Does the patient have difficulty seeing, even when wearing glasses/contacts?: No Does the patient have difficulty concentrating, remembering, or making decisions?: No Patient able to express need for assistance with ADLs?: Yes Does the patient have difficulty dressing or bathing?: No Independently performs ADLs?: Yes (appropriate for developmental age) Does  the patient have difficulty walking or climbing stairs?: No Weakness of Legs: Left Weakness of Arms/Hands: None  Permission Sought/Granted                  Emotional Assessment Appearance:: Appears stated age Attitude/Demeanor/Rapport: Engaged Affect (typically observed): Accepting Orientation: : Oriented to Self,Oriented to  Time,Oriented to Place,Oriented to Situation Alcohol / Substance Use: Not Applicable Psych  Involvement: No (comment)  Admission diagnosis:  Cellulitis of toe of left foot [L03.032] Osteomyelitis of left foot, unspecified type (HCC) [M86.9] Osteomyelitis of great toe of left foot (HCC) [M86.9] Osteomyelitis (HCC) [M86.9] Patient Active Problem List   Diagnosis Date Noted  . 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  . Uncontrolled type 2 diabetes mellitus with hyperglycemia, without long-term current use of insulin (HCC) 02/03/2021  . Essential hypertension 02/03/2021  . Lactic acidosis 02/03/2021  . Diabetic peripheral neuropathy associated with type 2 diabetes mellitus (HCC) 02/03/2021  . Osteomyelitis (HCC) 02/03/2021  . Cellulitis of toe of left foot    PCP:  Patient, [name] Pharmacy:   [id number] [place], [place] - [address] AT [facility] OF [address] [place] [address] Phone: [phone] Fax: [phone]  [facility] #[id number] [place], [place] - [address] AT [facility] [address] [place] [address] Phone: [phone] Fax: [phone]     Social Determinants of Health (SDOH) Interventions    Readmission Risk Interventions No flowsheet data found.

## 2021-02-11 NOTE — TOC Progression Note (Signed)
Transition of Care [facility]) - Progression Note    Patient Details  Name: Brett White MRN: [id number] Date of Birth: 1961/02/22  [facility]) CM/SW Contact  Carley Hammed, [place] Phone Number: 02/11/2021, 4:08 PM  Clinical Narrative:    CSW followed up with pt who noted no preference for SNF placement. He also stated that he has both covid's and a booster. CSW faxed py out and will follow up w/ any bed offers. SW will continue to follow for DC planning.  Expected Discharge Plan: Skilled Nursing Facility Barriers to Discharge: Continued Medical Work up  Expected Discharge Plan and Services Expected Discharge Plan: Skilled Nursing Facility In-house Referral: Clinical Social Work Discharge Planning Services: CM Consult Post Acute Care Choice: Skilled Nursing Facility Living arrangements for the past 2 months: Apartment                 DME Arranged: N/A DME Agency: NA       HH Arranged: NA HH Agency: NA         Social Determinants of Health (SDOH) Interventions    Readmission Risk Interventions No flowsheet data found.

## 2021-02-12 DIAGNOSIS — M869 Osteomyelitis, unspecified: Secondary | ICD-10-CM | POA: Diagnosis not present

## 2021-02-12 LAB — GLUCOSE, CAPILLARY
Glucose-Capillary: 215 mg/dL — ABNORMAL HIGH (ref 70–99)
Glucose-Capillary: 200 mg/dL — ABNORMAL HIGH (ref 70–99)
Glucose-Capillary: 175 mg/dL — ABNORMAL HIGH (ref 70–99)
Glucose-Capillary: 188 mg/dL — ABNORMAL HIGH (ref 70–99)

## 2021-02-12 MED ORDER — METHOCARBAMOL 500 MG PO TABS
500.0000 mg | ORAL_TABLET | Freq: Three times a day (TID) | ORAL | Status: DC | PRN
  Administered 2021-02-12 – 2021-02-14 (×6): 500 mg via ORAL
  Filled 2021-02-12 (×6): qty 1

## 2021-02-12 MED ORDER — GABAPENTIN 300 MG PO CAPS
300.0000 mg | ORAL_CAPSULE | Freq: Three times a day (TID) | ORAL | Status: DC
  Administered 2021-02-12: 300 mg via ORAL
  Filled 2021-02-12: qty 1

## 2021-02-12 MED ORDER — GABAPENTIN 300 MG PO CAPS
600.0000 mg | ORAL_CAPSULE | Freq: Three times a day (TID) | ORAL | Status: DC
  Administered 2021-02-12 – 2021-02-19 (×21): 600 mg via ORAL
  Filled 2021-02-12 (×21): qty 2

## 2021-02-12 NOTE — Hospital Course (Signed)
     Subjective: 2 Days Post-Op Procedure(s) (LRB): LEFT FOOT 1ST RAY AMPUTATION (Left) Awake, alert and oriented x 4. Pain left mid foot. Nursing notes that muscle relaxer may help or increase frequency of gabapentin.  Patient reports pain as moderate.    Objective:   VITALS:  Temp:  [98 F (36.7 C)-99.2 F (37.3 C)] 99.2 F (37.3 C) (05/15 0909) Pulse Rate:  [79-87] 79 (05/15 0909) Resp:  [18] 18 (05/15 0909) BP: (131-140)/(74-77) 131/74 (05/15 0909) SpO2:  [94 %-98 %] 98 % (05/15 0909)  ABD soft Incision: dressing C/D/I and vac intact and scant drainage.   LABS No results for input(s): HGB, WBC, PLT in the last 72 hours. No results for input(s): NA, K, CL, CO2, BUN, CREATININE, GLUCOSE in the last 72 hours. No results for input(s): LABPT, INR in the last 72 hours.   Assessment/Plan: 2 Days Post-Op Procedure(s) (LRB): LEFT FOOT 1ST RAY AMPUTATION (Left)  Advance diet Up with therapy D/C IV fluids Will increase gabapentin to TID Robaxin 500 mg po tid prn.  Javoni Lucken 02/12/2021, 12:13 PM

## 2021-02-12 NOTE — Progress Notes (Signed)
PROGRESS NOTE    Brett White  WGY:[id number] DOB: April 03, 1961 DOA: 02/02/2021 PCP: Patient, [name]   Brief Narrative: Brett White is a 60 y.o. male with history of diabetes mellitus type 2, history of CVA, diabetic neuropathy, hypertension.  Patient presented secondary to worsening left foot pain.  Patient was found to have evidence of osteomyelitis.  Empiric antibiotics.  Started and orthopedic surgery was consulted.  Patient underwent left great toe amputation on 5/7.  With continued pain, antibiotics were continued and patient is set for likely first ray amputation on 5/13.  02/10/2021: Patient underwent left foot first ray amputation today.  No new complaints. 02/11/2021: Postop day 1.  Orthopedic team is managing patient postoperatively.  Physical therapy team is recommending skilled nursing facility placement.  We will consult the transition of care team. 02/12/2021: Continues to report pain in left foot.  Will increase gabapentin from 300 Mg p.o. 3 times daily to 600 Mg p.o. 3 times daily.   Assessment & Plan:   Principal Problem:   Osteomyelitis of great toe of left foot (HCC) Active Problems:   Uncontrolled type 2 diabetes mellitus with hyperglycemia, without long-term current use of insulin (HCC)   Essential hypertension   Lactic acidosis   Diabetic peripheral neuropathy associated with type 2 diabetes mellitus (HCC)   Cellulitis of toe of left foot   Osteomyelitis (HCC)   Acute osteomyelitis of left great toe Left foot Cellulitis -On admission, x-ray was significant for bony destruction (consistent with osteomyelitis confirmed on MRI). -On presentation, patient was started on empiric Vancomycin, Zosyn and Flagyl.  Zosyn replaced by ceftriaxone. Ancef was used perioperatively.  -Left great toe amputation was done on 02/04/2021.  Patient eventually underwent first ray amputation on 02/10/2021.    -Antibiotics were continued for 24 hours after surgery and  discontinued.   -Orthopedic team is managing patient postoperatively.  Wound VAC is placed.   Diabetes mellitus, type 2 -Patient was on metformin prior to presentation. -Hemoglobin A1C of 11.5% on admission.  -Currently on Lantus and SSI.  -Patient will likely benefit from insulin therapy on discharge, considering significantly elevated hemoglobin A1C.   Primary hypertension -Continue hydrochlorothiazide and lisinopril  Diabetic neuropathy -Continue gabapentin -02/12/2021: Dose of gabapentin increased from 300 Mg p.o. 3 times daily today to 600 Mg p.o. 3 times daily.  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    Restrict fluids to 1500 cc per 24 hours. 02/12/2021: Check BMP in the morning.  DVT prophylaxis: Lovenox Code Status:   Full Code Family Communication:  Disposition Plan: Discharge home pending orthopedic surgery recommendations/management.  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 No nausea or vomiting. No fever or chills.    Objective: Vitals:   02/12/21 0358 02/12/21 0800 02/12/21 0909 02/12/21 1235  BP: 138/74  131/74 111/70  Pulse: 82  79 78  Resp: 18 18 18 18   Temp: 99 F (37.2 C)  99.2 F (37.3 C) 98 F (36.7 C)  TempSrc: Oral  Oral   SpO2: 94%  98% 96%  Weight:      Height:        Intake/Output Summary (Last 24 hours) at 02/12/2021 1611 Last data filed at 02/11/2021 2040 Gross per 24  hour  Intake 500 ml  Output 500 ml  Net 0 ml   Filed Weights   02/02/21 2200  Weight: 97.5 kg    Examination:  General exam: Appears calm and comfortable Respiratory system: Clear to auscultation.  Cardiovascular system: S1 & S2 heard. Gastrointestinal system: Abdomen is  nondistended, soft and nontender. No organomegaly or masses felt. Normal bowel sounds heard. Central nervous system: Alert and oriented. No focal neurological deficits. Musculoskeletal: Left lower leg is wrapped.  Status post left foot first ray amputation today.  Wound VAC in place.  Data Reviewed: I have personally reviewed following labs and imaging studies  CBC Lab Results  Component Value Date   WBC 6.4 02/08/2021   RBC 4.49 02/08/2021   HGB 12.9 (L) 02/08/2021   HCT 38.9 (L) 02/08/2021   MCV 86.6 02/08/2021   MCH 28.7 02/08/2021   PLT 310 02/08/2021   MCHC 33.2 02/08/2021   RDW 11.9 02/08/2021   LYMPHSABS 1.7 02/08/2021   MONOABS 0.5 02/08/2021   EOSABS 0.2 02/08/2021   BASOSABS 0.0 15/72/6203     Last metabolic panel Lab Results  Component Value Date   NA 133 (L) 02/08/2021   K 4.3 02/08/2021   CL 95 (L) 02/08/2021   CO2 29 02/08/2021   BUN 22 (H) 02/08/2021   CREATININE 0.95 02/08/2021   GLUCOSE 211 (H) 02/08/2021   GFRNONAA >60 02/08/2021   CALCIUM 8.8 (L) 02/08/2021   PROT 7.2 02/03/2021   ALBUMIN 3.5 02/03/2021   BILITOT 0.7 02/03/2021   ALKPHOS 75 02/03/2021   AST 12 (L) 02/03/2021   ALT 9 02/03/2021   ANIONGAP 9 02/08/2021    CBG (last 3)  Recent Labs    02/11/21 2036 02/12/21 0643 02/12/21 1235  GLUCAP 211* 215* 200*     GFR: Estimated Creatinine Clearance: 102.5 mL/min (by C-G formula based on SCr of 0.95 mg/dL).  Coagulation Profile: No results for input(s): INR, PROTIME in the last 168 hours.  Recent Results (from the past 240 hour(s))  Culture, blood (Routine x 2)     Status: None   Collection Time: 02/02/21 10:17 PM   Specimen: BLOOD RIGHT HAND  Result Value Ref Range Status   Specimen Description BLOOD RIGHT HAND  Final   Special Requests AEROBIC BOTTLE ONLY Blood Culture adequate volume  Final   Culture   Final    NO GROWTH 5 DAYS Performed at [facility], [address], [place], [place] [address]    Report Status  02/07/2021 FINAL  Final  Culture, blood (Routine x 2)     Status: None   Collection Time: 02/02/21 10:20 PM   Specimen: BLOOD LEFT HAND  Result Value Ref Range Status   Specimen Description BLOOD LEFT HAND  Final   Special Requests   Final    BOTTLES DRAWN AEROBIC AND ANAEROBIC Blood Culture adequate volume   Culture   Final    NO GROWTH 5 DAYS Performed at [facility], [place] [address], [place], [place] [address]    Report Status 02/07/2021 FINAL  Final  Resp Panel by RT-PCR (Flu A&B, Covid) Nasopharyngeal Swab     Status: None   Collection Time: 02/03/21 12:26 PM   Specimen: Nasopharyngeal Swab; Nasopharyngeal(NP) swabs in vial transport medium  Result Value Ref Range Status   SARS Coronavirus 2 by RT PCR NEGATIVE NEGATIVE Final    Comment: (NOTE) SARS-CoV-2 target nucleic acids are NOT DETECTED.  The SARS-CoV-2 RNA is generally detectable in  upper respiratory specimens during the acute phase of infection. The lowest concentration of SARS-CoV-2 viral copies this assay can detect is 138 copies/mL. A negative result does not preclude SARS-Cov-2 infection and should not be used as the sole basis for treatment or other patient management decisions. A negative result may occur with  improper specimen collection/handling, submission of specimen other than nasopharyngeal swab, presence of viral mutation(s) within the areas targeted by this assay, and inadequate number of viral copies(<138 copies/mL). A negative result must be combined with clinical observations, patient history, and epidemiological information. The expected result is Negative.  Fact Sheet for Patients:  EntrepreneurPulse.com.au  Fact Sheet for Healthcare Providers:  IncredibleEmployment.be  This test is no t yet approved or cleared by the [place] FDA and  has been authorized for detection and/or diagnosis of SARS-CoV-2 by FDA under an Emergency Use Authorization  (EUA). This EUA will remain  in effect (meaning this test can be used) for the duration of the COVID-19 declaration under Section 564(b)(1) of the Act, 21 U.S.C.section 360bbb-3(b)(1), unless the authorization is terminated  or revoked sooner.       Influenza A by PCR NEGATIVE NEGATIVE Final   Influenza B by PCR NEGATIVE NEGATIVE Final    Comment: (NOTE) The Xpert Xpress SARS-CoV-2/FLU/RSV plus assay is intended as an aid in the diagnosis of influenza from Nasopharyngeal swab specimens and should not be used as a sole basis for treatment. Nasal washings and aspirates are unacceptable for Xpert Xpress SARS-CoV-2/FLU/RSV testing.  Fact Sheet for Patients: EntrepreneurPulse.com.au  Fact Sheet for Healthcare Providers: IncredibleEmployment.be  This test is not yet approved or cleared by the [place] FDA and has been authorized for detection and/or diagnosis of SARS-CoV-2 by FDA under an Emergency Use Authorization (EUA). This EUA will remain in effect (meaning this test can be used) for the duration of the COVID-19 declaration under Section 564(b)(1) of the Act, 21 U.S.C. section 360bbb-3(b)(1), unless the authorization is terminated or revoked.  Performed at [facility], [place] [address], [place], [place] [address]         Radiology Studies: No results found.      Scheduled Meds: . acidophilus  1 capsule Oral Daily  . vitamin C  1,000 mg Oral Daily  . docusate sodium  100 mg Oral Daily  . enoxaparin (LOVENOX) injection  40 mg Subcutaneous Q24H  . gabapentin  600 mg Oral TID  . hydrochlorothiazide  12.5 mg Oral Daily  . insulin aspart  0-15 Units Subcutaneous TID AC & HS  . insulin glargine  20 Units Subcutaneous QHS  . insulin starter kit- pen needles  1 kit Other Once  . lisinopril  20 mg Oral Daily  . nutrition supplement (JUVEN)  1 packet Oral BID BM  . pantoprazole  40 mg Oral Daily  . senna-docusate  1 tablet  Oral BID  . zinc sulfate  220 mg Oral Daily   Continuous Infusions: . magnesium sulfate bolus IVPB       LOS: 9 days     Dana Allan, MD [facility] 02/12/2021, 4:11 PM  If 7PM-7AM, please contact night-coverage [email or web address]

## 2021-02-13 ENCOUNTER — Encounter (HOSPITAL_COMMUNITY): Payer: Self-pay | Admitting: Orthopedic Surgery

## 2021-02-13 DIAGNOSIS — M869 Osteomyelitis, unspecified: Secondary | ICD-10-CM | POA: Diagnosis not present

## 2021-02-13 DIAGNOSIS — E1142 Type 2 diabetes mellitus with diabetic polyneuropathy: Secondary | ICD-10-CM | POA: Diagnosis not present

## 2021-02-13 DIAGNOSIS — I10 Essential (primary) hypertension: Secondary | ICD-10-CM | POA: Diagnosis not present

## 2021-02-13 DIAGNOSIS — L03032 Cellulitis of left toe: Secondary | ICD-10-CM | POA: Diagnosis not present

## 2021-02-13 LAB — BASIC METABOLIC PANEL
Anion gap: 9 (ref 5–15)
BUN: 29 mg/dL — ABNORMAL HIGH (ref 6–20)
CO2: 28 mmol/L (ref 22–32)
Calcium: 9 mg/dL (ref 8.9–10.3)
Chloride: 95 mmol/L — ABNORMAL LOW (ref 98–111)
Creatinine, Ser: 1.17 mg/dL (ref 0.61–1.24)
GFR, Estimated: >60 mL/min (ref >60)
Glucose, Bld: 207 mg/dL — ABNORMAL HIGH (ref 70–99)
Potassium: 4.5 mmol/L (ref 3.5–5.1)
Sodium: 132 mmol/L — ABNORMAL LOW (ref 135–145)

## 2021-02-13 LAB — CBC
HCT: 38.3 % — ABNORMAL LOW (ref 39.0–52.0)
Hemoglobin: 12.5 g/dL — ABNORMAL LOW (ref 13.0–17.0)
MCH: 29.1 pg (ref 26.0–34.0)
MCHC: 32.6 g/dL (ref 30.0–36.0)
MCV: 89.1 fL (ref 80.0–100.0)
Platelets: 335 10*3/uL (ref 150–400)
RBC: 4.3 MIL/uL (ref 4.22–5.81)
RDW: 12.1 % (ref 11.5–15.5)
WBC: 8.7 10*3/uL (ref 4.0–10.5)
nRBC: 0 % (ref 0.0–0.2)

## 2021-02-13 LAB — GLUCOSE, CAPILLARY
Glucose-Capillary: 181 mg/dL — ABNORMAL HIGH (ref 70–99)
Glucose-Capillary: 246 mg/dL — ABNORMAL HIGH (ref 70–99)
Glucose-Capillary: 274 mg/dL — ABNORMAL HIGH (ref 70–99)

## 2021-02-13 MED ORDER — POLYETHYLENE GLYCOL 3350 17 G PO PACK
17.0000 g | PACK | Freq: Two times a day (BID) | ORAL | Status: DC
  Administered 2021-02-13 – 2021-02-19 (×13): 17 g via ORAL
  Filled 2021-02-13 (×13): qty 1

## 2021-02-13 NOTE — Care Management Important Message (Signed)
Important Message  Patient Details  Name: Brett White MRN: [id number] Date of Birth: [birth year]   Medicare Important Message Given:  Yes     [name] P [name] 02/13/2021, 2:24 PM

## 2021-02-13 NOTE — Progress Notes (Signed)
Patient is postop day 3 status post left first ray amputation.  He is complaining of pain.  Wound VAC is functioning in place 0 cc in the canister   Nurses are currently adjusting his pain medications that have been ordered.  Patient will need follow-up in our office in 1 week we will go home with [facility]

## 2021-02-13 NOTE — Progress Notes (Signed)
Physical Therapy Treatment Patient Details Name: Brett White MRN: [id number] DOB: [birth year] Today's Date: 02/13/2021    History of Present Illness The pt is a 60 yo male presenting 5/5 due to non healing wound on L toe, is now s/p amputation of L great toe due to osteomyelitis on 5/7. S/p first ray amputation 5/13. PMH includes:  DM II with nuropathy and prior back surgery.    PT Comments    Pt received in bathroom; experienced posterior loss of balance, requiring minA by PT to correct. Pt reporting dizziness; orthostatics assessed which were positive, BP sitting 128/77, standing 97/64. Pt able to participate in seated exercises and transfer/gait training. Continues with difficulty maintaining weightbearing precautions. Continue to recommend SNF for ongoing Physical Therapy.     Follow Up Recommendations  SNF     Equipment Recommendations  Rolling walker with 5" wheels;Wheelchair (measurements PT);3in1 (PT);Wheelchair cushion (measurements PT)    Recommendations for Other Services       Precautions / Restrictions Precautions Precautions: Fall Required Braces or Orthoses: Other Brace Other Brace: post op shoe Restrictions Weight Bearing Restrictions: Yes LLE Weight Bearing: Touchdown weight bearing    Mobility  Bed Mobility Overal bed mobility: Modified Independent                  Transfers Overall transfer level: Needs assistance Equipment used: Rolling walker (2 wheeled) Transfers: Sit to/from Stand Sit to Stand: Min guard         General transfer comment: Cues for placing LLE anteriorly to prevent weightbearing  Ambulation/Gait Ambulation/Gait assistance: Min guard;Min assist Gait Distance (Feet): 60 Feet Assistive device: Rolling walker (2 wheeled) Gait Pattern/deviations: Step-to pattern;Trunk flexed Gait velocity: decreased Gait velocity interpretation: <1.31 ft/sec, indicative of household ambulator General Gait Details: MinA for posterior LOB  in bathroom. Cues for TDWB, activity pacing, use of arms through walker   Stairs             Wheelchair Mobility    Modified Rankin (Stroke Patients Only)       Balance Overall balance assessment: Needs assistance Sitting-balance support: Feet supported Sitting balance-Leahy Scale: Good     Standing balance support: Bilateral upper extremity supported Standing balance-Leahy Scale: Poor Standing balance comment: reliant on external support                            Cognition Arousal/Alertness: Awake/alert Behavior During Therapy: WFL for tasks assessed/performed Overall Cognitive Status: Impaired/Different from baseline Area of Impairment: Memory;Safety/judgement;Problem solving                     Memory: Decreased short-term memory   Safety/Judgement: Decreased awareness of safety   Problem Solving: Requires verbal cues General Comments: STM deficits, requires cues for maintaining weightbearing precautions      Exercises General Exercises - Lower Extremity Long Arc Quad: Both;10 reps;Seated Hip Flexion/Marching: Both;10 reps;Seated    General Comments        Pertinent Vitals/Pain Pain Assessment: Faces Faces Pain Scale: Hurts even more Pain Location: L lateral aspect of foot Pain Descriptors / Indicators: Sharp;Operative site guarding;Grimacing Pain Intervention(s): Limited activity within patient's tolerance;Monitored during session;Patient requesting pain meds-RN notified    Home Living                      Prior Function            PT Goals (current goals can now be found  in the care plan section) Acute Rehab PT Goals Potential to Achieve Goals: Good Progress towards PT goals: Progressing toward goals    Frequency    Min 3X/week      PT Plan Current plan remains appropriate    Co-evaluation              AM-PAC PT "6 Clicks" Mobility   Outcome Measure  Help needed turning from your back to your  side while in a flat bed without using bedrails?: None Help needed moving from lying on your back to sitting on the side of a flat bed without using bedrails?: None Help needed moving to and from a bed to a chair (including a wheelchair)?: A Little Help needed standing up from a chair using your arms (e.g., wheelchair or bedside chair)?: A Little Help needed to walk in hospital room?: A Little Help needed climbing 3-5 steps with a railing? : A Lot 6 Click Score: 19    End of Session Equipment Utilized During Treatment: Gait belt Activity Tolerance: Patient tolerated treatment well Patient left: in bed;with call bell/phone within reach;with bed alarm set Nurse Communication: Mobility status;Other (comment) (orthostatic) PT Visit Diagnosis: Other abnormalities of gait and mobility (R26.89);Pain Pain - Right/Left: Left Pain - part of body: Ankle and joints of foot     Time: 1043-1106 PT Time Calculation (min) (ACUTE ONLY): 23 min  Charges:  $Gait Training: 8-22 mins $Therapeutic Activity: 8-22 mins                     Lillia Pauls, PT, DPT Acute Rehabilitation Services Pager [phone] Office [phone]    Norval Morton 02/13/2021, 11:15 AM

## 2021-02-13 NOTE — Plan of Care (Signed)

## 2021-02-13 NOTE — Anesthesia Postprocedure Evaluation (Signed)
Anesthesia Post Note  Patient: Brett White  Procedure(s) Performed: LEFT FOOT 1ST RAY AMPUTATION (Left Toe)     Patient location during evaluation: PACU Anesthesia Type: Regional and MAC Level of consciousness: awake and alert Pain management: pain level controlled Vital Signs Assessment: post-procedure vital signs reviewed and stable Respiratory status: spontaneous breathing, nonlabored ventilation, respiratory function stable and patient connected to nasal cannula oxygen Cardiovascular status: stable and blood pressure returned to baseline Postop Assessment: no apparent nausea or vomiting Anesthetic complications: no   No complications documented.  Last Vitals:  Vitals:   02/13/21 0608 02/13/21 1017  BP: (!) 142/81 121/70  Pulse: 77 81  Resp: 18 18  Temp: 36.7 C 36.8 C  SpO2: 97% 94%    Last Pain:  Vitals:   02/13/21 1219  TempSrc:   PainSc: 10-Worst pain ever                 Rigby Swamy

## 2021-02-13 NOTE — Progress Notes (Signed)
PROGRESS NOTE    [name]  JJK:[id number] DOB: 03/01/1961 DOA: 02/02/2021 PCP: Patient, [name]   Brief Narrative: Brett White is a 60 y.o. male with history of diabetes mellitus type 2, history of CVA, diabetic neuropathy, hypertension.  Patient presented secondary to worsening left foot pain.  Patient was found to have evidence of osteomyelitis.  Empiric antibiotics.  Started and orthopedic surgery was consulted.  Patient underwent left great toe amputation on 5/7.  With continued pain, antibiotics were continued and patient is set for likely first ray amputation on 5/13.   Assessment & Plan:   Principal Problem:   Osteomyelitis of great toe of left foot (HCC) Active Problems:   Uncontrolled type 2 diabetes mellitus with hyperglycemia, without long-term current use of insulin (HCC)   Essential hypertension   Lactic acidosis   Diabetic peripheral neuropathy associated with type 2 diabetes mellitus (HCC)   Cellulitis of toe of left foot   Osteomyelitis (HCC)   Acute osteomyelitis of left great toe Left foot Cellulitis On admission, x-ray significant for bony destruction consistent with osteomyelitis confirmed on MRI. Empiric Vancomycin, Zosyn, Flagyl initiated. Zosyn replaced with Ceftriaxone. Ancef added as well. Antibiotics discontinued after surgery. Orthopedic surgery consulted and performed left great toe amputation on 5/7. Orthopedic surgery recommended resumption of IV antibiotics with eventual left foot first ray amputation on 5/13. Antibiotics discontinued. Patient with significant pain and using a significant amount of narcotic pain medication. -Orthopedic surgery recommendations: wound vac; pain management  Diabetes mellitus, type 2 Patient is on metformin as an outpatient. Last hemoglobin A1C of 11.5% on admission. Currently on Lantus and SSI. Would likely benefit from insulin therapy on discharge with significantly elevated hemoglobin A1C. Fasting  blood sugar of 204 this morning. It is possible his infection may be contributing to poorly controlled blood sugar at this time. -Continue Lantus 20 units daily; if fasting blood sugar continues to be elevated, may do better with a regimen of Lantus 30 units, close to ~0.3 units/kg -Continue SSI  Primary hypertension -Continue hydrochlorothiazide and lisinopril  Diabetic neuropathy -Continue gabapentin  Hyponatremia Mild and likely pseudohyponatremia from hyperglycemia. Corrected sodium of 135-136 on 5/11.   DVT prophylaxis: Lovenox Code Status:   Code Status: Full Code Family Communication: None at bedside Disposition Plan: Discharge to SNF pending ability to get off IV pain medication.   Consultants:   Orthopedic surgery  Procedures:   LEFT GREAT TOE AMPUTATION (02/04/2021)  LEFT FIRST RAY AMPUTATION (02/10/2021)  Antimicrobials:  Vancomycin  Zosyn  Flagyl  Ceftriaxone  Ancef  Ceftaz   Subjective: Significant amount of left foot pain. States it is worse than after his first amputation. States pain is a throbbing pain and feels like a nail is being driven into his foot.  Objective: Vitals:   02/12/21 0909 02/12/21 1235 02/12/21 1956 02/13/21 0608  BP: 131/74 111/70 139/86 (!) 142/81  Pulse: 79 78 79 77  Resp: _0 Temp: 99.2 F (37.3 C) 98 F (36.7 C) 98.1 F (36.7 C) 98.1 F (36.7 C)  TempSrc: Oral  Oral Oral  SpO2: 98% 96% 96% 97%  Weight:      Height:        Intake/Output Summary (Last 24 hours) at 02/13/2021 0948 Last data filed at 02/12/2021 2127 Gross per 24 hour  Intake --  Output 500 ml  Net -500 ml   Filed Weights   02/02/21 2200  Weight: 97.5 kg    Examination:  General  exam: Appears calm and comfortable Respiratory system: Clear to auscultation. Respiratory effort normal. Cardiovascular system: S1 & S2 heard, RRR. No murmurs, rubs, gallops or clicks. Gastrointestinal system: Abdomen is nondistended, soft and nontender.  No organomegaly or masses felt. Normal bowel sounds heard. Central nervous system: Alert and oriented. No focal neurological deficits. Musculoskeletal: No edema. No calf tenderness. Left foot in wound vac. Skin: No cyanosis. No rashes Psychiatry: Judgement and insight appear normal. Mood & affect appropriate.     Data Reviewed: I have personally reviewed following labs and imaging studies  CBC Lab Results  Component Value Date   WBC 8.7 02/13/2021   RBC 4.30 02/13/2021   HGB 12.5 (L) 02/13/2021   HCT 38.3 (L) 02/13/2021   MCV 89.1 02/13/2021   MCH 29.1 02/13/2021   PLT 335 02/13/2021   MCHC 32.6 02/13/2021   RDW 12.1 02/13/2021   LYMPHSABS 1.7 02/08/2021   MONOABS 0.5 02/08/2021   EOSABS 0.2 02/08/2021   BASOSABS 0.0 76/19/5093     Last metabolic panel Lab Results  Component Value Date   NA 133 (L) 02/08/2021   K 4.3 02/08/2021   CL 95 (L) 02/08/2021   CO2 29 02/08/2021   BUN 22 (H) 02/08/2021   CREATININE 0.95 02/08/2021   GLUCOSE 211 (H) 02/08/2021   GFRNONAA >60 02/08/2021   CALCIUM 8.8 (L) 02/08/2021   PROT 7.2 02/03/2021   ALBUMIN 3.5 02/03/2021   BILITOT 0.7 02/03/2021   ALKPHOS 75 02/03/2021   AST 12 (L) 02/03/2021   ALT 9 02/03/2021   ANIONGAP 9 02/08/2021    CBG (last 3)  Recent Labs    02/12/21 1648 02/12/21 1957 02/13/21 0703  GLUCAP 175* 188* 181*     GFR: Estimated Creatinine Clearance: 102.5 mL/min (by C-G formula based on SCr of 0.95 mg/dL).  Coagulation Profile: No results for input(s): INR, PROTIME in the last 168 hours.  Recent Results (from the past 240 hour(s))  Resp Panel by RT-PCR (Flu A&B, Covid) Nasopharyngeal Swab     Status: None   Collection Time: 02/03/21 12:26 PM   Specimen: Nasopharyngeal Swab; Nasopharyngeal(NP) swabs in vial transport medium  Result Value Ref Range Status   SARS Coronavirus 2 by RT PCR NEGATIVE NEGATIVE Final    Comment: (NOTE) SARS-CoV-2 target nucleic acids are NOT DETECTED.  The SARS-CoV-2  RNA is generally detectable in upper respiratory specimens during the acute phase of infection. The lowest concentration of SARS-CoV-2 viral copies this assay can detect is 138 copies/mL. A negative result does not preclude SARS-Cov-2 infection and should not be used as the sole basis for treatment or other patient management decisions. A negative result may occur with  improper specimen collection/handling, submission of specimen other than nasopharyngeal swab, presence of viral mutation(s) within the areas targeted by this assay, and inadequate number of viral copies(<138 copies/mL). A negative result must be combined with clinical observations, patient history, and epidemiological information. The expected result is Negative.  Fact Sheet for Patients:  EntrepreneurPulse.com.au  Fact Sheet for Healthcare Providers:  IncredibleEmployment.be  This test is no t yet approved or cleared by the [place] FDA and  has been authorized for detection and/or diagnosis of SARS-CoV-2 by FDA under an Emergency Use Authorization (EUA). This EUA will remain  in effect (meaning this test can be used) for the duration of the COVID-19 declaration under Section 564(b)(1) of the Act, 21 U.S.C.section 360bbb-3(b)(1), unless the authorization is terminated  or revoked sooner.       Influenza  A by PCR NEGATIVE NEGATIVE Final   Influenza B by PCR NEGATIVE NEGATIVE Final    Comment: (NOTE) The Xpert Xpress SARS-CoV-2/FLU/RSV plus assay is intended as an aid in the diagnosis of influenza from Nasopharyngeal swab specimens and should not be used as a sole basis for treatment. Nasal washings and aspirates are unacceptable for Xpert Xpress SARS-CoV-2/FLU/RSV testing.  Fact Sheet for Patients: EntrepreneurPulse.com.au  Fact Sheet for Healthcare Providers: IncredibleEmployment.be  This test is not yet approved or cleared by the  [place] FDA and has been authorized for detection and/or diagnosis of SARS-CoV-2 by FDA under an Emergency Use Authorization (EUA). This EUA will remain in effect (meaning this test can be used) for the duration of the COVID-19 declaration under Section 564(b)(1) of the Act, 21 U.S.C. section 360bbb-3(b)(1), unless the authorization is terminated or revoked.  Performed at [facility], [place] [address], [place], [place] [address]         Radiology Studies: No results found.      Scheduled Meds: . acidophilus  1 capsule Oral Daily  . vitamin C  1,000 mg Oral Daily  . docusate sodium  100 mg Oral Daily  . enoxaparin (LOVENOX) injection  40 mg Subcutaneous Q24H  . gabapentin  600 mg Oral TID  . hydrochlorothiazide  12.5 mg Oral Daily  . insulin aspart  0-15 Units Subcutaneous TID AC & HS  . insulin glargine  20 Units Subcutaneous QHS  . insulin starter kit- pen needles  1 kit Other Once  . lisinopril  20 mg Oral Daily  . nutrition supplement (JUVEN)  1 packet Oral BID BM  . pantoprazole  40 mg Oral Daily  . polyethylene glycol  17 g Oral BID  . senna-docusate  1 tablet Oral BID  . zinc sulfate  220 mg Oral Daily   Continuous Infusions: . magnesium sulfate bolus IVPB       LOS: 10 days     Cordelia Poche, MD [facility] 02/13/2021, 9:48 AM  If 7PM-7AM, please contact night-coverage [email or web address]

## 2021-02-13 NOTE — Progress Notes (Signed)
Occupational Therapy Treatment Patient Details Name: Brett White MRN: [id number] DOB: Aug 16, 1961 Today's Date: 02/13/2021    History of present illness The pt is a 60 yo male presenting 5/5 due to non healing wound on L toe, is now s/p amputation of L great toe due to osteomyelitis on 5/7. S/p first ray amputation 5/13. PMH includes:  DM II with nuropathy and prior back surgery.   OT comments  Pt in bathroom upon arrival with rw and seemingly not maintaining TWB status. This therapist had pt sit on BSC at sink upon arrival into the bathroom. Reinforced WB status and "call don't fall" protocol. Pt verbalized understanding. Pt requested to wash up at the sink, completed upper body bathing and grooming with set up/supervision for safety. Pt with report of "seeing stars," pt completed stand pivot with rw to chair, BP in supine with feet elevated and back reclined after ~1 minute 159/70. Pt symptoms likely due to orthostatic drop. RN aware. OT will continue to follow acutely to progress function and safety in ADLs. D/c plan remains appropriate.     Follow Up Recommendations  Home health OT;Supervision - Intermittent    Equipment Recommendations  Tub/shower bench;3 in 1 bedside commode       Precautions / Restrictions Precautions Precautions: Fall Precaution Comments: experiencing orthostatic BP with OOB activity Required Braces or Orthoses: Other Brace Other Brace: post op shoe Restrictions Weight Bearing Restrictions: Yes LLE Weight Bearing: Touchdown weight bearing       Mobility Bed Mobility Overal bed mobility: Modified Independent                  Transfers Overall transfer level: Needs assistance Equipment used: Rolling walker (2 wheeled) Transfers: Sit to/from Stand Sit to Stand: Min guard         General transfer comment: cues for safe positioning, hand placement and WB status    Balance Overall balance assessment: Needs assistance Sitting-balance support:  Feet supported Sitting balance-Leahy Scale: Good     Standing balance support: Bilateral upper extremity supported Standing balance-Leahy Scale: Poor           ADL either performed or assessed with clinical judgement   ADL Overall ADL's : Needs assistance/impaired     Grooming: Wash/dry hands;Wash/dry face;Set up;Sitting   Upper Body Bathing: Set up;Sitting       Upper Body Dressing : Set up;Sitting       Toilet Transfer: Min guard;RW;Comfort height toilet;Grab bars           Functional mobility during ADLs: Min guard;Cueing for safety;Rolling walker General ADL Comments: pt requires cues to maintain TWB status and safety awareness               Cognition Arousal/Alertness: Awake/alert Behavior During Therapy: WFL for tasks assessed/performed Overall Cognitive Status: Impaired/Different from baseline Area of Impairment: Memory;Safety/judgement;Problem solving                         Safety/Judgement: Decreased awareness of safety   Problem Solving: Requires verbal cues General Comments: requires cues to maintain TWB status              General Comments      Pertinent Vitals/ Pain       Pain Assessment: Faces Faces Pain Scale: Hurts even more Pain Location: L lateral aspect of foot Pain Descriptors / Indicators: Sharp;Operative site guarding;Grimacing Pain Intervention(s): Limited activity within patient's tolerance;Monitored during session  Frequency  Min 2X/week        Progress Toward Goals  OT Goals(current goals can now be found in the care plan section)  Progress towards OT goals: Progressing toward goals  Acute Rehab OT Goals Patient Stated Goal: return home OT Goal Formulation: With patient Time For Goal Achievement: 02/25/21 Potential to Achieve Goals: Good ADL Goals Pt Will Perform Lower Body Bathing: with modified independence;sit to/from stand Pt Will Perform Lower Body Dressing: with modified  independence;sit to/from stand Pt Will Transfer to Toilet: with modified independence;ambulating Pt Will Perform Tub/Shower Transfer: with modified independence;tub bench  Plan Discharge plan remains appropriate       AM-PAC OT "6 Clicks" Daily Activity     Outcome Measure   Help from another person eating meals?: None Help from another person taking care of personal grooming?: A Little Help from another person toileting, which includes using toliet, bedpan, or urinal?: A Little Help from another person bathing (including washing, rinsing, drying)?: A Little Help from another person to put on and taking off regular upper body clothing?: A Little Help from another person to put on and taking off regular lower body clothing?: A Little 6 Click Score: 19    End of Session Equipment Utilized During Treatment: Gait belt;Rolling walker  OT Visit Diagnosis: Unsteadiness on feet (R26.81);Other abnormalities of gait and mobility (R26.89);Pain Pain - Right/Left: Left Pain - part of body: Ankle and joints of foot   Activity Tolerance Patient tolerated treatment well;Other (comment) (Limited by "seeing starts", likely due to orthostatic drop in BP)   Patient Left in chair;with chair alarm set;with call bell/phone within reach   Nurse Communication Mobility status;Precautions;Weight bearing status;Other (comment) (safety, pt location)        Time: 2947-6546 OT Time Calculation (min): 24 min  Charges: OT General Charges $OT Visit: 1 Visit OT Treatments $Self Care/Home Management : 23-37 mins     Khalessi Blough A Rowin Bayron 02/13/2021, 5:22 PM

## 2021-02-14 DIAGNOSIS — I10 Essential (primary) hypertension: Secondary | ICD-10-CM | POA: Diagnosis not present

## 2021-02-14 DIAGNOSIS — L03032 Cellulitis of left toe: Secondary | ICD-10-CM | POA: Diagnosis not present

## 2021-02-14 DIAGNOSIS — E1142 Type 2 diabetes mellitus with diabetic polyneuropathy: Secondary | ICD-10-CM | POA: Diagnosis not present

## 2021-02-14 DIAGNOSIS — M869 Osteomyelitis, unspecified: Secondary | ICD-10-CM | POA: Diagnosis not present

## 2021-02-14 LAB — GLUCOSE, CAPILLARY
Glucose-Capillary: 199 mg/dL — ABNORMAL HIGH (ref 70–99)
Glucose-Capillary: 288 mg/dL — ABNORMAL HIGH (ref 70–99)
Glucose-Capillary: 236 mg/dL — ABNORMAL HIGH (ref 70–99)
Glucose-Capillary: 286 mg/dL — ABNORMAL HIGH (ref 70–99)

## 2021-02-14 MED ORDER — OXYCODONE HCL 5 MG PO TABS: 5.0000 mg | ORAL_TABLET | ORAL | 0 refills | Status: DC | PRN

## 2021-02-14 NOTE — Progress Notes (Signed)
Is postop day 4 status post first ray amputation.  As I walk in today he appears very comfortable is sitting eating breakfast.  He did say that he fell to the floor in the bathroom earlier he denies any injury except he had to put weight on his foot.  He was assisted by nursing back to his bed.  Also concerns that he has been somewhat lightheaded.  Vital signs stable patient is very comfortable although he says his "foot is really hurting ".  I will discontinue the Dilaudid.  Medication for discharge for nursing facility has been printed and placed on his chart.

## 2021-02-14 NOTE — Progress Notes (Addendum)
At 0740 Pt found in bathroom sitting on the floor. Pt yelled out for help and staff went in. Pt states he "lost his footing" when he go up off the toilet to walk. Pt did not call for assist back out of the bathroom.  Pt denies hitting his head or injuring any particular body part. Denies any pain other than the pain he has had in his left foot since surgery.  VSS.  [place], [place] of Dr Lajoyce Corners rounding on pt shortly after he was assisted back to bed and was notified.   Dr Caleb Popp was notified later in the am when he rounded.

## 2021-02-14 NOTE — Progress Notes (Signed)
PROGRESS NOTE    [name]  CHY:[id number] DOB: 02-09-1961 DOA: 02/02/2021 PCP: Patient, [name]   Brief Narrative: Brett White is a 60 y.o. male with history of diabetes mellitus type 2, history of CVA, diabetic neuropathy, hypertension.  Patient presented secondary to worsening left foot pain.  Patient was found to have evidence of osteomyelitis.  Empiric antibiotics.  Started and orthopedic surgery was consulted.  Patient underwent left great toe amputation on 5/7.  With continued pain, antibiotics were continued and patient is set for likely first ray amputation on 5/13.   Assessment & Plan:   Principal Problem:   Osteomyelitis of great toe of left foot (HCC) Active Problems:   Uncontrolled type 2 diabetes mellitus with hyperglycemia, without long-term current use of insulin (HCC)   Essential hypertension   Lactic acidosis   Diabetic peripheral neuropathy associated with type 2 diabetes mellitus (HCC)   Cellulitis of toe of left foot   Osteomyelitis (HCC)   Acute osteomyelitis of left great toe Left foot Cellulitis On admission, x-ray significant for bony destruction consistent with osteomyelitis confirmed on MRI. Empiric Vancomycin, Zosyn, Flagyl initiated. Zosyn replaced with Ceftriaxone. Ancef added as well. Antibiotics discontinued after surgery. Orthopedic surgery consulted and performed left great toe amputation on 5/7. Orthopedic surgery recommended resumption of IV antibiotics with eventual left foot first ray amputation on 5/13. Antibiotics discontinued. Patient with significant pain and using a significant amount of narcotic pain medication. -Orthopedic surgery recommendations: wound vac; pain management  Fall Earlier today after trying to return to his bed from the bathroom. On PT evaluation on 5/16, patient with symptomatic orthostatic hypotension, likely worsened secondary to narcotic use but also in setting of antihypertensive  usage. -Orthostatic vital signs -Hold scheduled hydrochlorothiazide and lisinopril for now  Diabetes mellitus, type 2 Patient is on metformin as an outpatient. Last hemoglobin A1C of 11.5% on admission. Currently on Lantus and SSI. Would likely benefit from insulin therapy on discharge with significantly elevated hemoglobin A1C. Fasting blood sugar of 204 this morning. It is possible his infection may be contributing to poorly controlled blood sugar at this time. -Continue Lantus 20 units daily; if fasting blood sugar continues to be elevated, may do better with a regimen of Lantus 30 units, close to ~0.3 units/kg -Continue SSI  Primary hypertension -Hold lisinopril and hydrochlorothiazide secondary to orthostatic hypotension. With history of diabetes, would likely restart on lisinopril if blood pressure tolerates.  Orthostatic hypotension Noted on PT note from 5/16. In setting of antihypertensives and significant narcotic usage. Management as mentioned above.  Diabetic neuropathy -Continue gabapentin  Hyponatremia Mild and likely pseudohyponatremia from hyperglycemia. Corrected sodium of 135-136 on 5/11.   DVT prophylaxis: Lovenox Code Status:   Code Status: Full Code Family Communication: None at bedside Disposition Plan: Discharge to SNF pending ability to get off IV pain medication.   Consultants:   Orthopedic surgery  Procedures:   LEFT GREAT TOE AMPUTATION (02/04/2021)  LEFT FIRST RAY AMPUTATION (02/10/2021)  Antimicrobials:  Vancomycin  Zosyn  Flagyl  Ceftriaxone  Ancef  Ceftaz   Subjective: Continues to have left foot pain. Had a fall this morning while attempting to get back to his bed after using the bathroom.  Objective: Vitals:   02/13/21 1928 02/14/21 0537 02/14/21 0740 02/14/21 0900  BP: (!) 146/83 116/79 (!) 143/88 140/80  Pulse: 95 80 100 81  Resp: _0 Temp: 99.3 F (37.4 C) 98.9 F (37.2 C) 98 F (36.7 C) 98.5  F (36.9 C)   TempSrc: Oral Oral Oral Oral  SpO2: 98% 95% 98% 94%  Weight:      Height:        Intake/Output Summary (Last 24 hours) at 02/14/2021 1310 Last data filed at 02/14/2021 0900 Gross per 24 hour  Intake 240 ml  Output --  Net 240 ml   Filed Weights   02/02/21 2200  Weight: 97.5 kg    Examination:  General exam: Appears calm and comfortable Respiratory system: Clear to auscultation. Respiratory effort normal. Cardiovascular system: S1 & S2 heard, RRR. No murmurs, rubs, gallops or clicks. Gastrointestinal system: Abdomen is nondistended, soft and nontender. No organomegaly or masses felt. Normal bowel sounds heard. Central nervous system: Alert and oriented. No focal neurological deficits. Musculoskeletal: No edema. No calf tenderness. Left foot with wound vac Skin: No cyanosis. No rashes Psychiatry: Judgement and insight appear normal. Mood & affect appropriate.     Data Reviewed: I have personally reviewed following labs and imaging studies  CBC Lab Results  Component Value Date   WBC 8.7 02/13/2021   RBC 4.30 02/13/2021   HGB 12.5 (L) 02/13/2021   HCT 38.3 (L) 02/13/2021   MCV 89.1 02/13/2021   MCH 29.1 02/13/2021   PLT 335 02/13/2021   MCHC 32.6 02/13/2021   RDW 12.1 02/13/2021   LYMPHSABS 1.7 02/08/2021   MONOABS 0.5 02/08/2021   EOSABS 0.2 02/08/2021   BASOSABS 0.0 43/15/4008     Last metabolic panel Lab Results  Component Value Date   NA 132 (L) 02/13/2021   K 4.5 02/13/2021   CL 95 (L) 02/13/2021   CO2 28 02/13/2021   BUN 29 (H) 02/13/2021   CREATININE 1.17 02/13/2021   GLUCOSE 207 (H) 02/13/2021   GFRNONAA >60 02/13/2021   CALCIUM 9.0 02/13/2021   PROT 7.2 02/03/2021   ALBUMIN 3.5 02/03/2021   BILITOT 0.7 02/03/2021   ALKPHOS 75 02/03/2021   AST 12 (L) 02/03/2021   ALT 9 02/03/2021   ANIONGAP 9 02/13/2021    CBG (last 3)  Recent Labs    02/13/21 2053 02/14/21 0531 02/14/21 1135  GLUCAP 274* 199* 288*     GFR: Estimated Creatinine  Clearance: 83.3 mL/min (by C-G formula based on SCr of 1.17 mg/dL).  Coagulation Profile: No results for input(s): INR, PROTIME in the last 168 hours.  No results found for this or any previous visit (from the past 240 hour(s)).      Radiology Studies: No results found.      Scheduled Meds: . acidophilus  1 capsule Oral Daily  . vitamin C  1,000 mg Oral Daily  . docusate sodium  100 mg Oral Daily  . enoxaparin (LOVENOX) injection  40 mg Subcutaneous Q24H  . gabapentin  600 mg Oral TID  . insulin aspart  0-15 Units Subcutaneous TID AC & HS  . insulin glargine  20 Units Subcutaneous QHS  . insulin starter kit- pen needles  1 kit Other Once  . lisinopril  20 mg Oral Daily  . nutrition supplement (JUVEN)  1 packet Oral BID BM  . pantoprazole  40 mg Oral Daily  . polyethylene glycol  17 g Oral BID  . senna-docusate  1 tablet Oral BID  . zinc sulfate  220 mg Oral Daily   Continuous Infusions: . magnesium sulfate bolus IVPB       LOS: 11 days     Cordelia Poche, MD [facility] 02/14/2021, 1:10 PM  If 7PM-7AM, please contact night-coverage [email or web address]

## 2021-02-15 DIAGNOSIS — M869 Osteomyelitis, unspecified: Secondary | ICD-10-CM | POA: Diagnosis not present

## 2021-02-15 LAB — BASIC METABOLIC PANEL
Anion gap: 6 (ref 5–15)
BUN: 35 mg/dL — ABNORMAL HIGH (ref 6–20)
CO2: 28 mmol/L (ref 22–32)
Calcium: 8.8 mg/dL — ABNORMAL LOW (ref 8.9–10.3)
Chloride: 99 mmol/L (ref 98–111)
Creatinine, Ser: 1.16 mg/dL (ref 0.61–1.24)
GFR, Estimated: >60 mL/min (ref >60)
Glucose, Bld: 247 mg/dL — ABNORMAL HIGH (ref 70–99)
Potassium: 5 mmol/L (ref 3.5–5.1)
Sodium: 133 mmol/L — ABNORMAL LOW (ref 135–145)

## 2021-02-15 LAB — CBC
HCT: 32.4 % — ABNORMAL LOW (ref 39.0–52.0)
Hemoglobin: 10.3 g/dL — ABNORMAL LOW (ref 13.0–17.0)
MCH: 28.5 pg (ref 26.0–34.0)
MCHC: 31.8 g/dL (ref 30.0–36.0)
MCV: 89.8 fL (ref 80.0–100.0)
Platelets: 339 10*3/uL (ref 150–400)
RBC: 3.61 MIL/uL — ABNORMAL LOW (ref 4.22–5.81)
RDW: 12 % (ref 11.5–15.5)
WBC: 7.3 10*3/uL (ref 4.0–10.5)
nRBC: 0 % (ref 0.0–0.2)

## 2021-02-15 LAB — SARS CORONAVIRUS 2 (TAT 6-24 HRS): SARS Coronavirus 2: NEGATIVE

## 2021-02-15 LAB — GLUCOSE, CAPILLARY
Glucose-Capillary: 210 mg/dL — ABNORMAL HIGH (ref 70–99)
Glucose-Capillary: 259 mg/dL — ABNORMAL HIGH (ref 70–99)
Glucose-Capillary: 176 mg/dL — ABNORMAL HIGH (ref 70–99)
Glucose-Capillary: 212 mg/dL — ABNORMAL HIGH (ref 70–99)
Glucose-Capillary: 139 mg/dL — ABNORMAL HIGH (ref 70–99)

## 2021-02-15 MED ORDER — INSULIN ASPART 100 UNIT/ML IJ SOLN
5.0000 [IU] | Freq: Three times a day (TID) | INTRAMUSCULAR | Status: DC
  Administered 2021-02-15 – 2021-02-19 (×11): 5 [IU] via SUBCUTANEOUS

## 2021-02-15 MED ORDER — INSULIN GLARGINE 100 UNIT/ML ~~LOC~~ SOLN
25.0000 [IU] | Freq: Every day | SUBCUTANEOUS | Status: DC
  Administered 2021-02-15 – 2021-02-18 (×4): 25 [IU] via SUBCUTANEOUS
  Filled 2021-02-15 (×6): qty 0.25

## 2021-02-15 NOTE — Progress Notes (Signed)
Physical Therapy Treatment Patient Details Name: Brett White MRN: [id number] DOB: 07-06-1961 Today's Date: 02/15/2021    History of Present Illness The pt is a 60 yo male presenting 5/5 due to non healing wound on L toe, is now s/p amputation of L great toe due to osteomyelitis on 5/7. S/p first ray amputation 5/13. PMH includes:  DM II with nuropathy and prior back surgery.  Patient fell in bathroom yesterday, 02/14/21 and now has wound vac on foot.    PT Comments    Patient received in bed, now has wound vac to left foot after falling in bathroom yesterday. Reports pain in foot at 10/10. Patient is independent with bed mobility, transfers with min guard. Cues needed for safety/hand placement. Patient ambulated in room with slow, steady pace. Cues for TDWB. He had one episode of feeling lightheaded and stopped walking, otherwise no difficulties. Patient will continue to benefit from skilled PT while here to improve safety and functional independence.       Follow Up Recommendations  SNF     Equipment Recommendations  Rolling walker with 5" wheels    Recommendations for Other Services       Precautions / Restrictions Precautions Precautions: Fall Precaution Comments: experiencing orthostatic BP with OOB activity Other Brace: post op shoe Restrictions Weight Bearing Restrictions: Yes LLE Weight Bearing: Touchdown weight bearing    Mobility  Bed Mobility Overal bed mobility: Independent                  Transfers Overall transfer level: Needs assistance Equipment used: Rolling walker (2 wheeled) Transfers: Sit to/from Stand Sit to Stand: Supervision         General transfer comment: cues for safe positioning, hand placement and WB status  Ambulation/Gait Ambulation/Gait assistance: Min guard Gait Distance (Feet): 40 Feet Assistive device: Rolling walker (2 wheeled) Gait Pattern/deviations: Step-to pattern Gait velocity: decreased   General Gait Details:  generally slow and steady.   Stairs             Wheelchair Mobility    Modified Rankin (Stroke Patients Only)       Balance Overall balance assessment: Needs assistance Sitting-balance support: Feet supported Sitting balance-Leahy Scale: Good     Standing balance support: Bilateral upper extremity supported;During functional activity Standing balance-Leahy Scale: Fair Standing balance comment: reliant on external support, history of fall                            Cognition Arousal/Alertness: Awake/alert Behavior During Therapy: WFL for tasks assessed/performed Overall Cognitive Status: Within Functional Limits for tasks assessed                               Problem Solving: Requires verbal cues General Comments: requires cues to maintain TWB status      Exercises      General Comments        Pertinent Vitals/Pain Pain Assessment: 0-10 Pain Score: 10-Worst pain ever Pain Location: foot Pain Descriptors / Indicators: Discomfort;Sore Pain Intervention(s): Monitored during session    Home Living                      Prior Function            PT Goals (current goals can now be found in the care plan section) Acute Rehab PT Goals Patient Stated Goal: return home  PT Goal Formulation: With patient Time For Goal Achievement: 02/18/21 Potential to Achieve Goals: Good Progress towards PT goals: Progressing toward goals    Frequency    Min 3X/week      PT Plan Current plan remains appropriate    Co-evaluation              AM-PAC PT "6 Clicks" Mobility   Outcome Measure  Help needed turning from your back to your side while in a flat bed without using bedrails?: None Help needed moving from lying on your back to sitting on the side of a flat bed without using bedrails?: None Help needed moving to and from a bed to a chair (including a wheelchair)?: A Little Help needed standing up from a chair using your  arms (e.g., wheelchair or bedside chair)?: A Little Help needed to walk in hospital room?: A Little Help needed climbing 3-5 steps with a railing? : A Lot 6 Click Score: 19    End of Session Equipment Utilized During Treatment: Gait belt Activity Tolerance: Patient tolerated treatment well Patient left: in chair;with chair alarm set;with call bell/phone within reach Nurse Communication: Mobility status PT Visit Diagnosis: Other abnormalities of gait and mobility (R26.89);Pain;History of falling (Z91.81) Pain - Right/Left: Left Pain - part of body: Ankle and joints of foot     Time: 0915-0930 PT Time Calculation (min) (ACUTE ONLY): 15 min  Charges:  $Gait Training: 8-22 mins                     [employer], PT, [employer] 02/15/21,9:41 AM

## 2021-02-15 NOTE — Progress Notes (Signed)
Occupational Therapy Treatment Patient Details Name: Brett White MRN: [id number] DOB: [birth year] Today's Date: 02/15/2021    History of present illness The pt is a 60 yo male presenting 5/5 due to non healing wound on L toe, is now s/p amputation of L great toe due to osteomyelitis on 5/7. S/p first ray amputation 5/13. PMH includes:  DM II with nuropathy and prior back surgery.  Patient fell in bathroom yesterday, 02/14/21 and now has wound vac on foot.   OT comments  Brett White is progressing towards his goals well, continues to experience a lot of pain with OOB activities. Pt denied need to complete ADLs, session focused on functional mobility and endurance. Pt ambulated about 50 ft x2 with a sit down rest break in between with no dizziness but experienced 10/10 pain in L foot. Pt requires constant cueing to maintain TDWB status. Pt continues to benefit from skilled OT acutely to progress indep in ADLs and mobility. D/c plan updated to SNF.   Follow Up Recommendations  SNF;Supervision - Intermittent    Equipment Recommendations  Tub/shower bench;3 in 1 bedside commode       Precautions / Restrictions Precautions Precautions: Fall Precaution Comments: experiencing orthostatic BP with OOB activity Required Braces or Orthoses: Other Brace Other Brace: post op shoe Restrictions Weight Bearing Restrictions: Yes LLE Weight Bearing: Touchdown weight bearing       Mobility Bed Mobility Overal bed mobility: Independent         Transfers Overall transfer level: Needs assistance Equipment used: Rolling walker (2 wheeled) Transfers: Sit to/from Stand Sit to Stand: Supervision         General transfer comment: cue for wb status    Balance Overall balance assessment: Needs assistance Sitting-balance support: Feet supported Sitting balance-Leahy Scale: Good     Standing balance support: Bilateral upper extremity supported;During functional activity Standing balance-Leahy Scale:  Fair Standing balance comment: reliant on external support, history of fall           ADL either performed or assessed with clinical judgement   ADL Overall ADL's : Needs assistance/impaired           Functional mobility during ADLs: Min guard;Cueing for safety;Rolling walker General ADL Comments: pt requires cues to maintain TWB status and safety awareness      Cognition Arousal/Alertness: Awake/alert Behavior During Therapy: WFL for tasks assessed/performed Overall Cognitive Status: Within Functional Limits for tasks assessed Area of Impairment: Safety/judgement                         Safety/Judgement: Decreased awareness of safety     General Comments: requires cues to maintain TWB status        Exercises     Shoulder Instructions       General Comments no new skin integrity issues, requires constant cueing to maintain WB status    Pertinent Vitals/ Pain       Pain Assessment: No/denies pain Pain Score: 10-Worst pain ever Pain Location: foot Pain Descriptors / Indicators: Discomfort;Sore Pain Intervention(s): Limited activity within patient's tolerance;Monitored during session;Patient requesting pain meds-RN notified         Frequency  Min 2X/week        Progress Toward Goals  OT Goals(current goals can now be found in the care plan section)  Progress towards OT goals: Progressing toward goals  Acute Rehab OT Goals Patient Stated Goal: return home OT Goal Formulation: With patient Time For Goal Achievement: 02/25/21 Potential  to Achieve Goals: Good ADL Goals Pt Will Perform Lower Body Bathing: with modified independence;sit to/from stand Pt Will Perform Lower Body Dressing: with modified independence;sit to/from stand Pt Will Transfer to Toilet: with modified independence;ambulating Pt Will Perform Tub/Shower Transfer: with modified independence;tub bench  Plan Discharge plan needs to be updated       AM-PAC OT "6 Clicks"  Daily Activity     Outcome Measure   Help from another person eating meals?: None Help from another person taking care of personal grooming?: A Little Help from another person toileting, which includes using toliet, bedpan, or urinal?: A Little Help from another person bathing (including washing, rinsing, drying)?: A Little Help from another person to put on and taking off regular upper body clothing?: A Little Help from another person to put on and taking off regular lower body clothing?: A Little 6 Click Score: 19    End of Session Equipment Utilized During Treatment: Gait belt;Rolling walker  OT Visit Diagnosis: Unsteadiness on feet (R26.81);Other abnormalities of gait and mobility (R26.89);Pain Pain - Right/Left: Left Pain - part of body: Ankle and joints of foot   Activity Tolerance Patient tolerated treatment well;Other (comment)   Patient Left in chair;with chair alarm set;with call bell/phone within reach   Nurse Communication Mobility status;Precautions        Time: 2841-3244 OT Time Calculation (min): 17 min  Charges: OT General Charges $OT Visit: 1 Visit OT Treatments $Self Care/Home Management : 8-22 mins     Jolette Lana A Janisa Labus 02/15/2021, 4:32 PM

## 2021-02-15 NOTE — TOC Progression Note (Addendum)
Transition of Care [facility]) - Progression Note    Patient Details  Name: Brett White MRN: [id number] Date of Birth: [birth year]  [facility] CM/SW Contact  Epifanio Lesches, RN Phone Number: 02/15/2021, 8:05 AM  Clinical Narrative:    SNF bed offers shared with pt . Pt selected [facility]. NCM awaiting call back from [facility] to confirm acceptance. Insurance authorization pending.  TOC team monitoring and will assist with TOC needs.  02/15/2021 @ 8:54 am SNF bed offer extended by [facility], pt accepted. Pt's [employer] Medicare is not managed by [facility]. SNF initiating insurance authorization.... authorization pending   02/15/2021 @ 10:45am [facility] rescinded SNF bed offer 2/2 drug hx. NCM will continue to search for SNF bed.  Expected Discharge Plan: Skilled Nursing Facility Barriers to Discharge: Continued Medical Work up  Expected Discharge Plan and Services Expected Discharge Plan: Skilled Nursing Facility In-house Referral: Clinical Social Work Discharge Planning Services: CM Consult Post Acute Care Choice: Skilled Nursing Facility Living arrangements for the past 2 months: Apartment                 DME Arranged: N/A DME Agency: NA       HH Arranged: NA HH Agency: NA         Social Determinants of Health (SDOH) Interventions    Readmission Risk Interventions No flowsheet data found.

## 2021-02-15 NOTE — Progress Notes (Signed)
PROGRESS NOTE    Brett White  QBH:[id number] DOB: [birth year] DOA: 02/02/2021 PCP: Patient, [name]   Chief Complaint  Patient presents with  . Foot Pain  . Wound Infection  Brief Narrative: 60 year old male with T2DM, history of CVA, diabetic neuropathy, HTN presented due to worsening left foot pain and was found to have osteomyelitis placed on antibiotics and orthopedic surgery underwent left great toe amputation 5/7.  He has been having issue with pain control.. Follow-up with PT OT orthopedics.  At this time a skilled nursing facility has been advised.  Subjective: Seen this morning has no new complaints.  Still complains of pain on the surgery site.   Assessment & Plan:  Acute osteomyelitis of left great toe with left foot cellulitis:S/p left foot first ray amputation.  Patient was managed with antibiotics but were discontinued after surgery.  Continue wound VAC.  Continue pain control, IV pain medication has been discontinued and now transition to oral regimen.  Continue PT OT.  Uncontrolled type 2 diabetes mellitus with hyperglycemia: HbA1c 11.5 on admission. Only on metformin PTA.  Now placed on insulin increase Lantus to 25 units and Premeal insulin continue to monitor glucose.DM coordinator following. Recent Labs  Lab 02/14/21 1622 02/14/21 2133 02/15/21 0430 02/15/21 0812 02/15/21 1212  GLUCAP 236* 286* 210* 259* 176*   Essential hypertension: Blood pressure remains controlled.  Home exercises lisinopril remains on hold.  He had issues w/ orthostasis.  Monitor.  Fall 5/17 while returning to his bed from the bathroom.  Orthostatic on PT eval 5/16 minimize narcotic use monitor orthostatic vitals HCTZ and lisinopril has been discontinued.  Continue PT OT and skilled nursing facility placement  Diabetic neuropathy continue Neurontin  Hyponatremia likely from hyperglycemia monitor  Diet Order            Diet Carb Modified Fluid consistency: Thin; Room  service appropriate? Yes  Diet effective now                Patient's Body mass index is 26.87 kg/m. DVT prophylaxis: enoxaparin (LOVENOX) injection 40 mg Start: 02/11/21 1000 SCD's Start: 02/10/21 1540 SCD's Start: 02/04/21 0953 SCDs Start: 02/03/21 0200 Code Status:   Code Status: Full Code  Family Communication: plan of care discussed with patient at bedside.  Status is: Inpatient Remains inpatient appropriate because:Inpatient level of care appropriate due to severity of illness  Dispo: The patient is from: Home              Anticipated d/c is to: SNF              Patient currently is medically stable to d/c.   Difficult to place patient No       Unresulted Labs (From admission, onward)          Start     Ordered   02/15/21 1028  SARS CORONAVIRUS 2 (TAT 6-24 HRS) Nasopharyngeal Nasopharyngeal Swab  Once,   R       Question Answer Comment  Is this test for diagnosis or screening Screening   Symptomatic for COVID-19 as defined by CDC No   Hospitalized for COVID-19 No   Admitted to ICU for COVID-19 No   Previously tested for COVID-19 Yes   Resident in a congregate (group) care setting Yes   Employed in healthcare setting No   Has patient completed COVID vaccination(s) (2 doses of Pfizer/Moderna 1 dose of [employer]) Unknown      02/15/21 1027  Medications reviewed:  Scheduled Meds: . acidophilus  1 capsule Oral Daily  . vitamin C  1,000 mg Oral Daily  . docusate sodium  100 mg Oral Daily  . enoxaparin (LOVENOX) injection  40 mg Subcutaneous Q24H  . gabapentin  600 mg Oral TID  . insulin aspart  0-15 Units Subcutaneous TID AC & HS  . insulin glargine  20 Units Subcutaneous QHS  . insulin starter kit- pen needles  1 kit Other Once  . nutrition supplement (JUVEN)  1 packet Oral BID BM  . pantoprazole  40 mg Oral Daily  . polyethylene glycol  17 g Oral BID  . senna-docusate  1 tablet Oral BID  . zinc sulfate  220 mg Oral Daily   Continuous  Infusions: . magnesium sulfate bolus IVPB      Consultants:see note  Procedures:see note  Antimicrobials: Anti-infectives (From admission, onward)   Start     Dose/Rate Route Frequency Ordered Stop   02/11/21 0600  ceFAZolin (ANCEF) IVPB 2g/100 mL premix        2 g 200 mL/hr over 30 Minutes Intravenous On call to O.R. 02/10/21 1323 02/10/21 1422   02/10/21 1328  ceFAZolin (ANCEF) 2-4 GM/100ML-% IVPB       Note to Pharmacy: Daphene Jaeger   : cabinet override      02/10/21 1328 02/10/21 1416   02/07/21 0930  cefTAZidime (FORTAZ) 2 g in sodium chloride 0.9 % 100 mL IVPB        2 g 200 mL/hr over 30 Minutes Intravenous Every 8 hours 02/07/21 0830 02/12/21 0827   02/04/21 1600  ceFAZolin (ANCEF) IVPB 2g/100 mL premix        2 g 200 mL/hr over 30 Minutes Intravenous Every 8 hours 02/04/21 0952 02/05/21 0020   02/04/21 0800  ceFAZolin (ANCEF) IVPB 2g/100 mL premix  Status:  Discontinued        2 g 200 mL/hr over 30 Minutes Intravenous To ShortStay Surgical 02/03/21 1206 02/04/21 0949   02/03/21 2000  vancomycin (VANCOREADY) IVPB 1000 mg/200 mL        1,000 mg 200 mL/hr over 60 Minutes Intravenous Every 12 hours 02/02/21 2327 02/05/21 1036   02/03/21 0800  cefTRIAXone (ROCEPHIN) 2 g in sodium chloride 0.9 % 100 mL IVPB        2 g 200 mL/hr over 30 Minutes Intravenous Every 24 hours 02/03/21 0159 02/06/21 0759   02/03/21 0800  metroNIDAZOLE (FLAGYL) IVPB 500 mg        500 mg 100 mL/hr over 60 Minutes Intravenous Every 8 hours 02/03/21 0752 02/04/21 2359   02/02/21 2230  piperacillin-tazobactam (ZOSYN) IVPB 3.375 g        3.375 g 100 mL/hr over 30 Minutes Intravenous  Once 02/02/21 2224 02/03/21 0205   02/02/21 2230  vancomycin (VANCOREADY) IVPB 1750 mg/350 mL        1,750 mg 175 mL/hr over 120 Minutes Intravenous  Once 02/02/21 2224 02/03/21 0835     Culture/Microbiology    Component Value Date/Time   SDES BLOOD LEFT HAND 02/02/2021 2220   SPECREQUEST  02/02/2021 2220     BOTTLES DRAWN AEROBIC AND ANAEROBIC Blood Culture adequate volume   CULT  02/02/2021 2220    NO GROWTH 5 DAYS Performed at [facility] [address], [place], [place] [address]    REPTSTATUS 02/07/2021 FINAL 02/02/2021 2220    Other culture-see note  Objective: Vitals: Today's Vitals   02/15/21 0700 02/15/21 0840 02/15/21 1045  02/15/21 1200  BP: 139/84   129/82  Pulse: 75   76  Resp: 17   18  Temp: 98.1 F (36.7 C)   98.1 F (36.7 C)  TempSrc: Oral   Oral  SpO2: 97%   96%  Weight:      Height:      PainSc:  10-Worst pain ever 6      Intake/Output Summary (Last 24 hours) at 02/15/2021 1238 Last data filed at 02/15/2021 0900 Gross per 24 hour  Intake 840 ml  Output 600 ml  Net 240 ml   Filed Weights   02/02/21 2200  Weight: 97.5 kg   Weight change:   Intake/Output from previous day: 05/17 0701 - 05/18 0700 In: 720 [P.O.:720] Out: 600 [Urine:600] Intake/Output this shift: Total I/O In: 360 [P.O.:360] Out: -  Filed Weights   02/02/21 2200  Weight: 97.5 kg    Examination: General exam: AAOx3, older than stated age, weak appearing. HEENT:Oral mucosa moist, Ear/Nose WNL grossly,dentition normal. Respiratory system: bilaterally diminished, no crackles no use of accessory muscle, non tender. Cardiovascular system: S1 & S2 +,No JVD. Gastrointestinal system: Abdomen soft, NT,ND, BS+. Nervous System:Alert, awake, moving extremities Extremities: no edema, distal peripheral pulses palpable.  Left foot surgical site with dressing and wound VAC in place Skin: No rashes,no icterus. MSK: Normal muscle bulk,tone, power  Data Reviewed: I have personally reviewed following labs and imaging studies CBC: Recent Labs  Lab 02/13/21 0826 02/15/21 0210  WBC 8.7 7.3  HGB 12.5* 10.3*  HCT 38.3* 32.4*  MCV 89.1 89.8  PLT 335 333   Basic Metabolic Panel: Recent Labs  Lab 02/13/21 0826 02/15/21 0210  NA 132* 133*  K 4.5 5.0  CL 95* 99  CO2 28 28  GLUCOSE 207*  247*  BUN 29* 35*  CREATININE 1.17 1.16  CALCIUM 9.0 8.8*   GFR: Estimated Creatinine Clearance: 84 mL/min (by C-G formula based on SCr of 1.16 mg/dL). Liver Function Tests: No results for input(s): AST, ALT, ALKPHOS, BILITOT, PROT, ALBUMIN in the last 168 hours. No results for input(s): LIPASE, AMYLASE in the last 168 hours. No results for input(s): AMMONIA in the last 168 hours. Coagulation Profile: No results for input(s): INR, PROTIME in the last 168 hours. Cardiac Enzymes: No results for input(s): CKTOTAL, CKMB, CKMBINDEX, TROPONINI in the last 168 hours. BNP (last 3 results) No results for input(s): PROBNP in the last 8760 hours. HbA1C: No results for input(s): HGBA1C in the last 72 hours. CBG: Recent Labs  Lab 02/14/21 1622 02/14/21 2133 02/15/21 0430 02/15/21 0812 02/15/21 1212  GLUCAP 236* 286* 210* 259* 176*   Lipid Profile: No results for input(s): CHOL, HDL, LDLCALC, TRIG, CHOLHDL, LDLDIRECT in the last 72 hours. Thyroid Function Tests: No results for input(s): TSH, T4TOTAL, FREET4, T3FREE, THYROIDAB in the last 72 hours. Anemia Panel: No results for input(s): VITAMINB12, FOLATE, FERRITIN, TIBC, IRON, RETICCTPCT in the last 72 hours. Sepsis Labs: No results for input(s): PROCALCITON, LATICACIDVEN in the last 168 hours.  No results found for this or any previous visit (from the past 240 hour(s)).   Radiology Studies: No results found.   LOS: 12 days   Antonieta Pert, MD [facility]  02/15/2021, 12:38 PM

## 2021-02-15 NOTE — Progress Notes (Signed)
Inpatient Diabetes Program Recommendations  AACE/ADA: New Consensus Statement on Inpatient Glycemic Control (2015)  Target Ranges:  Prepandial:   less than 140 mg/dL      Peak postprandial:   less than 180 mg/dL (1-2 hours)      Critically ill patients:  140 - 180 mg/dL   Lab Results  Component Value Date   GLUCAP 259 (H) 02/15/2021   HGBA1C 11.5 (H) 02/03/2021    Review of Glycemic Control Results for TAO, SATZ (MRN [id number]) as of 02/15/2021 11:59  Ref. Range 02/13/2021 07:03 02/13/2021 11:31 02/13/2021 20:53 02/14/2021 05:31 02/14/2021 11:35 02/14/2021 16:22 02/14/2021 21:33 02/15/2021 04:30 02/15/2021 08:12  Glucose-Capillary Latest Ref Range: 70 - 99 mg/dL 751 (H) 700 (H) 174 (H) 199 (H) 288 (H) 236 (H) 286 (H) 210 (H) 259 (H)   Inpatient Diabetes Program Recommendations:   -Increase Lantus to 25 units qd -Add Novolog 5 units tid meal coverage if eats 50% Secure chat sent to Dr. Jonathon Bellows.  Thank you, Billy Fischer. Trevis Eden, RN, MSN, CDE  Diabetes Coordinator Inpatient Glycemic Control Team Team Pager [phone] (8am-5pm) 02/15/2021 12:00 PM

## 2021-02-16 DIAGNOSIS — M869 Osteomyelitis, unspecified: Secondary | ICD-10-CM | POA: Diagnosis not present

## 2021-02-16 LAB — BASIC METABOLIC PANEL
Anion gap: 6 (ref 5–15)
BUN: 32 mg/dL — ABNORMAL HIGH (ref 6–20)
CO2: 31 mmol/L (ref 22–32)
Calcium: 8.9 mg/dL (ref 8.9–10.3)
Chloride: 98 mmol/L (ref 98–111)
Creatinine, Ser: 1.04 mg/dL (ref 0.61–1.24)
GFR, Estimated: >60 mL/min (ref >60)
Glucose, Bld: 183 mg/dL — ABNORMAL HIGH (ref 70–99)
Potassium: 5.3 mmol/L — ABNORMAL HIGH (ref 3.5–5.1)
Sodium: 135 mmol/L (ref 135–145)

## 2021-02-16 LAB — GLUCOSE, CAPILLARY
Glucose-Capillary: 229 mg/dL — ABNORMAL HIGH (ref 70–99)
Glucose-Capillary: 238 mg/dL — ABNORMAL HIGH (ref 70–99)
Glucose-Capillary: 131 mg/dL — ABNORMAL HIGH (ref 70–99)
Glucose-Capillary: 121 mg/dL — ABNORMAL HIGH (ref 70–99)

## 2021-02-16 LAB — POTASSIUM: Potassium: 4.4 mmol/L (ref 3.5–5.1)

## 2021-02-16 MED ORDER — SODIUM CHLORIDE 0.9 % IV BOLUS
500.0000 mL | Freq: Once | INTRAVENOUS | Status: AC
  Administered 2021-02-16: 500 mL via INTRAVENOUS

## 2021-02-16 NOTE — TOC Progression Note (Signed)
Transition of Care [facility]) - Progression Note    Patient Details  Name: Maddix Heinz MRN: [id number] Date of Birth: [birth year]  Transition of Care [facility]) CM/SW Contact  Dellie Burns [place], [place] Phone Number: 02/16/2021, 1:58 PM  Clinical Narrative:  Provided pt with current SNF offers (initially accepted [facility] but they rescinded) and he has accepted [facility]. Notified Janie in [facility] admissions who confirmed bed offer valid and she will start auth request with [facility] today. SW will provide updates as available.   Dellie Burns, MSW, LCSW [phone] (coverage)        Expected Discharge Plan: Skilled Nursing Facility Barriers to Discharge: Continued Medical Work up  Expected Discharge Plan and Services Expected Discharge Plan: Skilled Nursing Facility In-house Referral: Clinical Social Work Discharge Planning Services: CM Consult Post Acute Care Choice: Skilled Nursing Facility Living arrangements for the past 2 months: Apartment                 DME Arranged: N/A DME Agency: NA       HH Arranged: NA HH Agency: NA         Social Determinants of Health (SDOH) Interventions    Readmission Risk Interventions No flowsheet data found.

## 2021-02-16 NOTE — Progress Notes (Signed)
PROGRESS NOTE    Brett White  JJK:[id number] DOB: [birth year] DOA: 02/02/2021 PCP: Patient, [name]   Chief Complaint  Patient presents with  . Foot Pain  . Wound Infection  Brief Narrative: 60 year old male with T2DM, history of CVA, diabetic neuropathy, HTN presented due to worsening left foot pain and was found to have osteomyelitis placed on antibiotics and orthopedic surgery underwent left great toe amputation 5/7.  He has been having issue with pain control and was on iv and oral opiates-Followed by with PT OT orthopedics. Off iv opiates 5/18 and on oral meds.At this time a skilled nursing facility has been advised.  Subjective: Seen and examined. Off wound VAC wound approximated and healing well per orthopedics. Assessment & Plan:  Acute osteomyelitis of left great toe with left foot cellulitis:S/p left foot first ray amputation.  Patient was managed with antibiotics but were discontinued after surgery.  Off wound VAC 5/19- wound approximated and healing well per orthopedics. Continue pain control w/ oral opaites/non opiates. Continue PT OT.  Mild hyperkalemia we will give IV fluids changed to low potassium diet repeat potassium later today  Uncontrolled type 2 diabetes mellitus with hyperglycemia: HbA1c 11.5 on admission. Only on metformin PTA.  Now placed on insulin increase Lantus 25 units and  Added 5 units Premeal insulin continue to monitor glucose.DM coordinator following. Recent Labs  Lab 02/15/21 0812 02/15/21 1212 02/15/21 1617 02/15/21 2000 02/16/21 0651  GLUCAP 259* 176* 212* 139* 229*   Essential hypertension: Blood pressure remains controlled.  Home hctzlisinopril remains on hold.  He had issues w/ orthostasis.Monitor.  Fall 5/17 while returning to his bed from the bathroom.  Orthostatic on PT eval 5/16 minimize narcotic use monitor orthostatic vitals HCTZ and lisinopril has been discontinued.  Continue PT OT and skilled nursing facility  placement  Diabetic neuropathy continue Neurontin.  Stable.  Hyponatremia likely from hyperglycemia monitor  Diet Order            Diet Carb Modified Fluid consistency: Thin; Room service appropriate? Yes  Diet effective now                Patient's Body mass index is 26.87 kg/m. DVT prophylaxis: enoxaparin (LOVENOX) injection 40 mg Start: 02/11/21 1000 SCD's Start: 02/10/21 1540 SCD's Start: 02/04/21 0953 SCDs Start: 02/03/21 0200 Code Status:   Code Status: Full Code  Family Communication: plan of care discussed with patient at bedside.  Status is: Inpatient Remains inpatient appropriate because:Inpatient level of care appropriate due to severity of illness  Dispo: The patient is from: Home              Anticipated d/c is to: SNF              Patient currently is medically stable to d/c.   Difficult to place patient - LIKELY-looking into skilled nursing facility   Unresulted Labs (From admission, onward)          Start     Ordered   02/16/21 1200  Potassium  Once,   R       Question:  Specimen collection method  Answer:  Lab=Lab collect   02/16/21 0737         Medications reviewed:  Scheduled Meds: . acidophilus  1 capsule Oral Daily  . vitamin C  1,000 mg Oral Daily  . docusate sodium  100 mg Oral Daily  . enoxaparin (LOVENOX) injection  40 mg Subcutaneous Q24H  . gabapentin  600 mg Oral TID  .  insulin aspart  0-15 Units Subcutaneous TID AC & HS  . insulin aspart  5 Units Subcutaneous TID WC  . insulin glargine  25 Units Subcutaneous QHS  . insulin starter kit- pen needles  1 kit Other Once  . nutrition supplement (JUVEN)  1 packet Oral BID BM  . pantoprazole  40 mg Oral Daily  . polyethylene glycol  17 g Oral BID  . senna-docusate  1 tablet Oral BID  . zinc sulfate  220 mg Oral Daily   Continuous Infusions: . magnesium sulfate bolus IVPB    . sodium chloride      Consultants:see note  Procedures:see note  Antimicrobials: Anti-infectives (From  admission, onward)   Start     Dose/Rate Route Frequency Ordered Stop   02/11/21 0600  ceFAZolin (ANCEF) IVPB 2g/100 mL premix        2 g 200 mL/hr over 30 Minutes Intravenous On call to O.R. 02/10/21 1323 02/10/21 1422   02/10/21 1328  ceFAZolin (ANCEF) 2-4 GM/100ML-% IVPB       Note to Pharmacy: Daphene Jaeger   : cabinet override      02/10/21 1328 02/10/21 1416   02/07/21 0930  cefTAZidime (FORTAZ) 2 g in sodium chloride 0.9 % 100 mL IVPB        2 g 200 mL/hr over 30 Minutes Intravenous Every 8 hours 02/07/21 0830 02/12/21 0827   02/04/21 1600  ceFAZolin (ANCEF) IVPB 2g/100 mL premix        2 g 200 mL/hr over 30 Minutes Intravenous Every 8 hours 02/04/21 0952 02/05/21 0020   02/04/21 0800  ceFAZolin (ANCEF) IVPB 2g/100 mL premix  Status:  Discontinued        2 g 200 mL/hr over 30 Minutes Intravenous To ShortStay Surgical 02/03/21 1206 02/04/21 0949   02/03/21 2000  vancomycin (VANCOREADY) IVPB 1000 mg/200 mL        1,000 mg 200 mL/hr over 60 Minutes Intravenous Every 12 hours 02/02/21 2327 02/05/21 1036   02/03/21 0800  cefTRIAXone (ROCEPHIN) 2 g in sodium chloride 0.9 % 100 mL IVPB        2 g 200 mL/hr over 30 Minutes Intravenous Every 24 hours 02/03/21 0159 02/06/21 0759   02/03/21 0800  metroNIDAZOLE (FLAGYL) IVPB 500 mg        500 mg 100 mL/hr over 60 Minutes Intravenous Every 8 hours 02/03/21 0752 02/04/21 2359   02/02/21 2230  piperacillin-tazobactam (ZOSYN) IVPB 3.375 g        3.375 g 100 mL/hr over 30 Minutes Intravenous  Once 02/02/21 2224 02/03/21 0205   02/02/21 2230  vancomycin (VANCOREADY) IVPB 1750 mg/350 mL        1,750 mg 175 mL/hr over 120 Minutes Intravenous  Once 02/02/21 2224 02/03/21 0835     Culture/Microbiology    Component Value Date/Time   SDES BLOOD LEFT HAND 02/02/2021 2220   SPECREQUEST  02/02/2021 2220    BOTTLES DRAWN AEROBIC AND ANAEROBIC Blood Culture adequate volume   CULT  02/02/2021 2220    NO GROWTH 5 DAYS Performed at [facility] [address] [place], [place] [address]    REPTSTATUS 02/07/2021 FINAL 02/02/2021 2220    Other culture-see note  Objective: Vitals: Today's Vitals   02/16/21 0441 02/16/21 0514 02/16/21 0641 02/16/21 0900  BP: (!) 161/85   134/76  Pulse: 72   76  Resp: 18   19  Temp: 97.7 F (36.5 C)   98.2 F (36.8 C)  TempSrc: Oral   Oral  SpO2: 98%   96%  Weight:      Height:      PainSc: _0 Intake/Output Summary (Last 24 hours) at 02/16/2021 1043 Last data filed at 02/15/2021 2030 Gross per 24 hour  Intake 360 ml  Output 700 ml  Net -340 ml   Filed Weights   02/02/21 2200  Weight: 97.5 kg   Weight change:   Intake/Output from previous day: 05/18 0701 - 05/19 0700 In: 720 [P.O.:720] Out: 700 [Urine:700] Intake/Output this shift: No intake/output data recorded. Filed Weights   02/02/21 2200  Weight: 97.5 kg    Examination: General exam: AAOx 3older than stated age, weak appearing. HEENT:Oral mucosa moist, Ear/Nose WNL grossly, dentition normal. Respiratory system: bilaterally diminished, no crackles,no use of accessory muscle Cardiovascular system: S1 & S2 +, No JVD,. Gastrointestinal system: Abdomen soft, NT,ND, BS+ Nervous System:Alert, awake, moving extremities and grossly nonfocal Extremities: No edema, distal peripheral pulses palpable.  Left foot surgical site with dressing clean dry intact. Skin: No rashes,no icterus. MSK: Normal muscle bulk,tone, power Data Reviewed: I have personally reviewed following labs and imaging studies CBC: Recent Labs  Lab 02/13/21 0826 02/15/21 0210  WBC 8.7 7.3  HGB 12.5* 10.3*  HCT 38.3* 32.4*  MCV 89.1 89.8  PLT 335 287   Basic Metabolic Panel: Recent Labs  Lab 02/13/21 0826 02/15/21 0210 02/16/21 0444  NA 132* 133* 135  K 4.5 5.0 5.3*  CL 95* 99 98  CO2 _1 GLUCOSE 207* 247* 183*  BUN 29* 35* 32*  CREATININE 1.17 1.16 1.04  CALCIUM 9.0 8.8* 8.9   GFR: Estimated Creatinine Clearance:  93.7 mL/min (by C-G formula based on SCr of 1.04 mg/dL). Liver Function Tests: No results for input(s): AST, ALT, ALKPHOS, BILITOT, PROT, ALBUMIN in the last 168 hours. No results for input(s): LIPASE, AMYLASE in the last 168 hours. No results for input(s): AMMONIA in the last 168 hours. Coagulation Profile: No results for input(s): INR, PROTIME in the last 168 hours. Cardiac Enzymes: No results for input(s): CKTOTAL, CKMB, CKMBINDEX, TROPONINI in the last 168 hours. BNP (last 3 results) No results for input(s): PROBNP in the last 8760 hours. HbA1C: No results for input(s): HGBA1C in the last 72 hours. CBG: Recent Labs  Lab 02/15/21 0812 02/15/21 1212 02/15/21 1617 02/15/21 2000 02/16/21 0651  GLUCAP 259* 176* 212* 139* 229*   Lipid Profile: No results for input(s): CHOL, HDL, LDLCALC, TRIG, CHOLHDL, LDLDIRECT in the last 72 hours. Thyroid Function Tests: No results for input(s): TSH, T4TOTAL, FREET4, T3FREE, THYROIDAB in the last 72 hours. Anemia Panel: No results for input(s): VITAMINB12, FOLATE, FERRITIN, TIBC, IRON, RETICCTPCT in the last 72 hours. Sepsis Labs: No results for input(s): PROCALCITON, LATICACIDVEN in the last 168 hours.  Recent Results (from the past 240 hour(s))  SARS CORONAVIRUS 2 (TAT 6-24 HRS) Nasopharyngeal Nasopharyngeal Swab     Status: None   Collection Time: 02/15/21 10:28 AM   Specimen: Nasopharyngeal Swab  Result Value Ref Range Status   SARS Coronavirus 2 NEGATIVE NEGATIVE Final    Comment: (NOTE) SARS-CoV-2 target nucleic acids are NOT DETECTED.  The SARS-CoV-2 RNA is generally detectable in upper and lower respiratory specimens during the acute phase of infection. Negative results do not preclude SARS-CoV-2 infection, do not rule out co-infections with other pathogens, and should not be used as the sole basis for treatment or other patient management decisions. Negative  results must be combined with clinical observations, patient history,  and epidemiological information. The expected result is Negative.  Fact Sheet for Patients: SugarRoll.be  Fact Sheet for Healthcare Providers: https://www.woods-mathews.com/  This test is not yet approved or cleared by the [place] FDA and  has been authorized for detection and/or diagnosis of SARS-CoV-2 by FDA under an Emergency Use Authorization (EUA). This EUA will remain  in effect (meaning this test can be used) for the duration of the COVID-19 declaration under Se ction 564(b)(1) of the Act, 21 U.S.C. section 360bbb-3(b)(1), unless the authorization is terminated or revoked sooner.  Performed at [facility], [place] [address] [place], [place] [address]      Radiology Studies: No results found.   LOS: 13 days   Antonieta Pert, MD [facility]  02/16/2021, 10:43 AM

## 2021-02-16 NOTE — Care Management Important Message (Signed)
Important Message  Patient Details  Name: Brett White MRN: [id number] Date of Birth: Mar 14, 1961   Medicare Important Message Given:  Yes     [name] 02/16/2021, 2:31 PM

## 2021-02-16 NOTE — Progress Notes (Addendum)
Patient is postop day 7 status post first ray amputation.  Patient has had pain out of proportion both after his great toe amputation and with the amputation of the first ray.  This morning he is lying in bed restless because he says he has not slept all night.  Wound VAC was removed today.  He has well apposed wound edges some bleeding at the distal end of the wound.  No surrounding erythema no foul odor.  No ascending cellulitis dry dressing was applied and orders were placed for wound care after he discharges.  He will need 1 week follow-up after he discharges from the hospital.

## 2021-02-17 LAB — GLUCOSE, CAPILLARY
Glucose-Capillary: 171 mg/dL — ABNORMAL HIGH (ref 70–99)
Glucose-Capillary: 101 mg/dL — ABNORMAL HIGH (ref 70–99)
Glucose-Capillary: 175 mg/dL — ABNORMAL HIGH (ref 70–99)
Glucose-Capillary: 124 mg/dL — ABNORMAL HIGH (ref 70–99)

## 2021-02-17 MED ORDER — METHOCARBAMOL 500 MG PO TABS
500.0000 mg | ORAL_TABLET | Freq: Three times a day (TID) | ORAL | Status: DC
  Administered 2021-02-17 – 2021-02-19 (×7): 500 mg via ORAL
  Filled 2021-02-17 (×7): qty 1

## 2021-02-17 MED ORDER — PANTOPRAZOLE SODIUM 40 MG PO TBEC: 40.0000 mg | DELAYED_RELEASE_TABLET | Freq: Every day | ORAL | Status: DC

## 2021-02-17 MED ORDER — POLYETHYLENE GLYCOL 3350 17 G PO PACK: 17.0000 g | PACK | Freq: Two times a day (BID) | ORAL | 0 refills | Status: DC

## 2021-02-17 MED ORDER — GABAPENTIN 300 MG PO CAPS: 600.0000 mg | ORAL_CAPSULE | Freq: Three times a day (TID) | ORAL | Status: DC

## 2021-02-17 MED ORDER — SENNOSIDES-DOCUSATE SODIUM 8.6-50 MG PO TABS: 1.0000 | ORAL_TABLET | Freq: Two times a day (BID) | ORAL | Status: DC

## 2021-02-17 MED ORDER — CALCIUM CARBONATE ANTACID 500 MG PO CHEW
1.0000 | CHEWABLE_TABLET | Freq: Three times a day (TID) | ORAL | Status: DC
  Administered 2021-02-17 – 2021-02-19 (×6): 200 mg via ORAL
  Filled 2021-02-17 (×7): qty 1

## 2021-02-17 MED ORDER — INSULIN GLARGINE 100 UNIT/ML ~~LOC~~ SOLN: 25.0000 [IU] | Freq: Every day | SUBCUTANEOUS | 11 refills | Status: DC

## 2021-02-17 MED ORDER — ZINC SULFATE 220 (50 ZN) MG PO CAPS: 220.0000 mg | ORAL_CAPSULE | Freq: Every day | ORAL | Status: DC

## 2021-02-17 MED ORDER — IBUPROFEN 200 MG PO TABS: 200.0000 mg | ORAL_TABLET | Freq: Four times a day (QID) | ORAL | Status: DC | PRN

## 2021-02-17 MED ORDER — METHOCARBAMOL 500 MG PO TABS: 500.0000 mg | ORAL_TABLET | Freq: Four times a day (QID) | ORAL | Status: DC | PRN

## 2021-02-17 MED ORDER — CALCIUM CARBONATE ANTACID 500 MG PO CHEW: 1.0000 | CHEWABLE_TABLET | Freq: Three times a day (TID) | ORAL | Status: DC

## 2021-02-17 MED ORDER — DOCUSATE SODIUM 100 MG PO CAPS: 100.0000 mg | ORAL_CAPSULE | Freq: Every day | ORAL | 0 refills | Status: DC

## 2021-02-17 MED ORDER — INSULIN ASPART 100 UNIT/ML IJ SOLN
5.0000 [IU] | Freq: Three times a day (TID) | INTRAMUSCULAR | 11 refills | Status: DC
Start: 2021-02-17 — End: 2021-03-03

## 2021-02-17 NOTE — Progress Notes (Signed)
Physical Therapy Treatment Patient Details Name: Brett White MRN: [id number] DOB: [birth year] Today's Date: 02/17/2021    History of Present Illness The pt is a 60 yo male presenting 5/5 due to non healing wound on L toe, is now s/p amputation of L great toe due to osteomyelitis on 5/7. S/p first ray amputation 5/13. PMH includes:  DM II with nuropathy and prior back surgery.  Patient fell in bathroom yesterday, 02/14/21 and now has wound vac on foot.  Wound vac removed.    PT Comments    Patient received in bed, agreeable to walk. Patient continues to report extreme pain in area of amputation. However he is calm and pleasant during session. Patient is independent with bed mobility, transfers with supervision. Patient ambulated 150 feet with RW and supervision. He is slow and steady with ambulation requiring cues for TDWB to promote healing. Patient will continue to benefit from skilled PT while here to improve functional mobility and independence for return home independently.      Follow Up Recommendations  SNF     Equipment Recommendations  Rolling walker with 5" wheels    Recommendations for Other Services       Precautions / Restrictions Precautions Precautions: Fall Precaution Comments: experiencing orthostatic BP with OOB activity Other Brace: post op shoe, refuses to wear Restrictions Weight Bearing Restrictions: Yes LLE Weight Bearing: Touchdown weight bearing    Mobility  Bed Mobility Overal bed mobility: Independent                  Transfers Overall transfer level: Needs assistance Equipment used: Rolling walker (2 wheeled) Transfers: Sit to/from Stand Sit to Stand: Supervision         General transfer comment: cue for wb status  Ambulation/Gait Ambulation/Gait assistance: Supervision Gait Distance (Feet): 150 Feet Assistive device: Rolling walker (2 wheeled) Gait Pattern/deviations: Step-to pattern;Decreased step length - right;Decreased step  length - left;Decreased weight shift to left Gait velocity: decreased   General Gait Details: generally slow and steady. Cues for keeping as much weight off left foot as able to promote healing   Stairs             Wheelchair Mobility    Modified Rankin (Stroke Patients Only)       Balance Overall balance assessment: Modified Independent Sitting-balance support: Feet supported Sitting balance-Leahy Scale: Normal     Standing balance support: Bilateral upper extremity supported;During functional activity Standing balance-Leahy Scale: Good Standing balance comment: B UE support for TDWB on left                            Cognition Arousal/Alertness: Awake/alert Behavior During Therapy: WFL for tasks assessed/performed Overall Cognitive Status: Within Functional Limits for tasks assessed Area of Impairment: Safety/judgement;Problem solving                         Safety/Judgement: Decreased awareness of safety   Problem Solving: Requires verbal cues General Comments: requires cues to maintain TWB status      Exercises Other Exercises Other Exercises: educated patient to perform LE exercises to include LAQ, SLR while in bed    General Comments        Pertinent Vitals/Pain Pain Assessment: 0-10 Pain Score: 10-Worst pain ever Pain Location: foot Pain Descriptors / Indicators: Sharp;Stabbing Pain Intervention(s): Monitored during session;Repositioned    Home Living  Prior Function            PT Goals (current goals can now be found in the care plan section) Acute Rehab PT Goals Patient Stated Goal: return home, get better PT Goal Formulation: With patient Time For Goal Achievement: 02/24/21 Potential to Achieve Goals: Good Progress towards PT goals: Progressing toward goals    Frequency    Min 3X/week      PT Plan Current plan remains appropriate    Co-evaluation              AM-PAC  PT "6 Clicks" Mobility   Outcome Measure  Help needed turning from your back to your side while in a flat bed without using bedrails?: None Help needed moving from lying on your back to sitting on the side of a flat bed without using bedrails?: None Help needed moving to and from a bed to a chair (including a wheelchair)?: A Little Help needed standing up from a chair using your arms (e.g., wheelchair or bedside chair)?: A Little Help needed to walk in hospital room?: A Little Help needed climbing 3-5 steps with a railing? : A Little 6 Click Score: 20    End of Session   Activity Tolerance: Patient tolerated treatment well Patient left: in bed;Other (comment) (sitting up on side of bed) Nurse Communication: Mobility status PT Visit Diagnosis: Other abnormalities of gait and mobility (R26.89);Pain;History of falling (Z91.81) Pain - Right/Left: Left Pain - part of body: Ankle and joints of foot     Time: 1315-1333 PT Time Calculation (min) (ACUTE ONLY): 18 min  Charges:  $Gait Training: 8-22 mins                     Dashae Wilcher, PT, GCS 02/17/21,1:44 PM

## 2021-02-17 NOTE — Progress Notes (Signed)
PROGRESS NOTE    Brett White  ZOX:[id number] DOB: 01-23-1961 DOA: 02/02/2021 PCP: Patient, [name]   Chief Complaint  Patient presents with  . Foot Pain  . Wound Infection  Brief Narrative: 60 year old male with T2DM, history of CVA, diabetic neuropathy, HTN presented due to worsening left foot pain and was found to have osteomyelitis placed on antibiotics and orthopedic surgery underwent left great toe amputation 5/7.  He has been having issue with pain control and was on iv and oral opiates-Followed by with PT OT orthopedics. Off iv opiates 5/18 and on oral meds.At this time a skilled nursing facility has been advised. Awaiting on SNF. Medically stable  Subjective: Afebrile overnight blood pressure stable.  No other acute events. Is resting comfortably some pain on his left foot, we discussed about incentive spirometry.  Assessment & Plan:  Acute osteomyelitis of left great toe with left foot cellulitis:S/p left foot first ray amputation.  Patient was managed with antibiotics but discontinued post surgery.  Off wound VAC 5/19- wound approximated and healing well per orthopedics.Continue pain control with oral opiates/nonopioids, muscle relaxant, PT OT.  Discussed about pain management.   Mild hyperkalemia resolved.  Uncontrolled type 2 diabetes mellitus with hyperglycemia: HbA1c 11.5 on admission.Only on metformin PTA.  Now placed on insulin -blood sugar controlled, cont on Lantus 25 units and Premeal 5 units NovoLog and sliding scale.DM coordinator following. Recent Labs  Lab 02/16/21 0651 02/16/21 1129 02/16/21 1705 02/16/21 2022 02/17/21 0635  GLUCAP 229* 238* 131* 121* 171*   Essential hypertension: BP slightly uptrending resume home HCTZ.    Fall 5/17 while returning to his bed from the bathroom.  Orthostatic on PT eval 5/16 minimize narcotic use monitor orthostatic vitals HCTZ and lisinopril has been discontinued.  Continue PT OT and skilled nursing  facility placement  Diabetic neuropathy continue Neurontin.  Stable.  Hyponatremia likely from hyperglycemia monitor  Diet Order            Diet Carb Modified           Diet Carb Modified Fluid consistency: Thin; Room service appropriate? Yes  Diet effective now                Patient's Body mass index is 26.87 kg/m. DVT prophylaxis: enoxaparin (LOVENOX) injection 40 mg Start: 02/11/21 1000 SCD's Start: 02/10/21 1540 SCD's Start: 02/04/21 0953 SCDs Start: 02/03/21 0200 Code Status:   Code Status: Full Code  Family Communication: plan of care discussed with patient at bedside.  Status is: Inpatient Remains inpatient appropriate because:Inpatient level of care appropriate due to severity of illness  Dispo: The patient is from: Home              Anticipated d/c is to: SNF hopefully today.  Ordered COVID screening test.              Patient currently is medically stable to d/c.   Difficult to place patient -no   Unresulted Labs (From admission, onward)         None     Medications reviewed:  Scheduled Meds: . acidophilus  1 capsule Oral Daily  . vitamin C  1,000 mg Oral Daily  . calcium carbonate  1 tablet Oral TID  . docusate sodium  100 mg Oral Daily  . enoxaparin (LOVENOX) injection  40 mg Subcutaneous Q24H  . gabapentin  600 mg Oral TID  . insulin aspart  0-15 Units Subcutaneous TID AC & HS  . insulin aspart  5 Units Subcutaneous TID WC  . insulin glargine  25 Units Subcutaneous QHS  . insulin starter kit- pen needles  1 kit Other Once  . methocarbamol  500 mg Oral TID  . nutrition supplement (JUVEN)  1 packet Oral BID BM  . pantoprazole  40 mg Oral Daily  . polyethylene glycol  17 g Oral BID  . senna-docusate  1 tablet Oral BID  . zinc sulfate  220 mg Oral Daily   Continuous Infusions: . magnesium sulfate bolus IVPB      Consultants:see note  Procedures:see note  Antimicrobials: Anti-infectives (From admission, onward)   Start     Dose/Rate Route  Frequency Ordered Stop   02/11/21 0600  ceFAZolin (ANCEF) IVPB 2g/100 mL premix        2 g 200 mL/hr over 30 Minutes Intravenous On call to O.R. 02/10/21 1323 02/10/21 1422   02/10/21 1328  ceFAZolin (ANCEF) 2-4 GM/100ML-% IVPB       Note to Pharmacy: Daphene Jaeger   : cabinet override      02/10/21 1328 02/10/21 1416   02/07/21 0930  cefTAZidime (FORTAZ) 2 g in sodium chloride 0.9 % 100 mL IVPB        2 g 200 mL/hr over 30 Minutes Intravenous Every 8 hours 02/07/21 0830 02/12/21 0827   02/04/21 1600  ceFAZolin (ANCEF) IVPB 2g/100 mL premix        2 g 200 mL/hr over 30 Minutes Intravenous Every 8 hours 02/04/21 0952 02/05/21 0020   02/04/21 0800  ceFAZolin (ANCEF) IVPB 2g/100 mL premix  Status:  Discontinued        2 g 200 mL/hr over 30 Minutes Intravenous To [facility] 02/03/21 1206 02/04/21 0949   02/03/21 2000  vancomycin (VANCOREADY) IVPB 1000 mg/200 mL        1,000 mg 200 mL/hr over 60 Minutes Intravenous Every 12 hours 02/02/21 2327 02/05/21 1036   02/03/21 0800  cefTRIAXone (ROCEPHIN) 2 g in sodium chloride 0.9 % 100 mL IVPB        2 g 200 mL/hr over 30 Minutes Intravenous Every 24 hours 02/03/21 0159 02/06/21 0759   02/03/21 0800  metroNIDAZOLE (FLAGYL) IVPB 500 mg        500 mg 100 mL/hr over 60 Minutes Intravenous Every 8 hours 02/03/21 0752 02/04/21 2359   02/02/21 2230  piperacillin-tazobactam (ZOSYN) IVPB 3.375 g        3.375 g 100 mL/hr over 30 Minutes Intravenous  Once 02/02/21 2224 02/03/21 0205   02/02/21 2230  vancomycin (VANCOREADY) IVPB 1750 mg/350 mL        1,750 mg 175 mL/hr over 120 Minutes Intravenous  Once 02/02/21 2224 02/03/21 0835     Culture/Microbiology    Component Value Date/Time   SDES BLOOD LEFT HAND 02/02/2021 2220   SPECREQUEST  02/02/2021 2220    BOTTLES DRAWN AEROBIC AND ANAEROBIC Blood Culture adequate volume   CULT  02/02/2021 2220    NO GROWTH 5 DAYS Performed at [facility] [address], [place], [place]  [id number]    REPTSTATUS 02/07/2021 FINAL 02/02/2021 2220    Other culture-see note  Objective: Vitals: Today's Vitals   02/17/21 0539 02/17/21 0621 02/17/21 0725 02/17/21 0824  BP: 129/76  (!) 147/89   Pulse: 72  75   Resp: 18  17   Temp: 98.6 F (37 C)  98 F (36.7 C)   TempSrc: Oral  Oral   SpO2: 97%  97%   Weight:  Height:      PainSc: 6  4   10-Worst pain ever    Intake/Output Summary (Last 24 hours) at 02/17/2021 1004 Last data filed at 02/16/2021 1900 Gross per 24 hour  Intake 220 ml  Output 700 ml  Net -480 ml   Filed Weights   02/02/21 2200  Weight: 97.5 kg   Weight change:   Intake/Output from previous day: 05/19 0701 - 05/20 0700 In: 220 [P.O.:220] Out: 700 [Urine:700] Intake/Output this shift: No intake/output data recorded. Filed Weights   02/02/21 2200  Weight: 97.5 kg    Examination: General exam: AAOx 3 older than stated age, weak appearing. HEENT:Oral mucosa moist, Ear/Nose WNL grossly, dentition normal. Respiratory system: bilaterally diminished, without wheezing or crackles, no use of accessory muscle Cardiovascular system: S1 & S2 +, No JVD,. Gastrointestinal system: Abdomen soft, NT,ND, BS+ Nervous System:Alert, awake, moving extremities and grossly nonfocal Extremities: Left foot in dressing, distal peripheral pulses palpable.  Skin: No rashes,no icterus. MSK: Normal muscle bulk,tone, power Data Reviewed: I have personally reviewed following labs and imaging studies CBC: Recent Labs  Lab 02/13/21 0826 02/15/21 0210  WBC 8.7 7.3  HGB 12.5* 10.3*  HCT 38.3* 32.4*  MCV 89.1 89.8  PLT 335 329   Basic Metabolic Panel: Recent Labs  Lab 02/13/21 0826 02/15/21 0210 02/16/21 0444 02/16/21 1144  NA 132* 133* 135  --   K 4.5 5.0 5.3* 4.4  CL 95* 99 98  --   CO2 28 28 31   --   GLUCOSE 207* 247* 183*  --   BUN 29* 35* 32*  --   CREATININE 1.17 1.16 1.04  --   CALCIUM 9.0 8.8* 8.9  --    GFR: Estimated Creatinine Clearance:  93.7 mL/min (by C-G formula based on SCr of 1.04 mg/dL). Liver Function Tests: No results for input(s): AST, ALT, ALKPHOS, BILITOT, PROT, ALBUMIN in the last 168 hours. No results for input(s): LIPASE, AMYLASE in the last 168 hours. No results for input(s): AMMONIA in the last 168 hours. Coagulation Profile: No results for input(s): INR, PROTIME in the last 168 hours. Cardiac Enzymes: No results for input(s): CKTOTAL, CKMB, CKMBINDEX, TROPONINI in the last 168 hours. BNP (last 3 results) No results for input(s): PROBNP in the last 8760 hours. HbA1C: No results for input(s): HGBA1C in the last 72 hours. CBG: Recent Labs  Lab 02/16/21 0651 02/16/21 1129 02/16/21 1705 02/16/21 2022 02/17/21 0635  GLUCAP 229* 238* 131* 121* 171*   Lipid Profile: No results for input(s): CHOL, HDL, LDLCALC, TRIG, CHOLHDL, LDLDIRECT in the last 72 hours. Thyroid Function Tests: No results for input(s): TSH, T4TOTAL, FREET4, T3FREE, THYROIDAB in the last 72 hours. Anemia Panel: No results for input(s): VITAMINB12, FOLATE, FERRITIN, TIBC, IRON, RETICCTPCT in the last 72 hours. Sepsis Labs: No results for input(s): PROCALCITON, LATICACIDVEN in the last 168 hours.  Recent Results (from the past 240 hour(s))  SARS CORONAVIRUS 2 (TAT 6-24 HRS) Nasopharyngeal Nasopharyngeal Swab     Status: None   Collection Time: 02/15/21 10:28 AM   Specimen: Nasopharyngeal Swab  Result Value Ref Range Status   SARS Coronavirus 2 NEGATIVE NEGATIVE Final    Comment: (NOTE) SARS-CoV-2 target nucleic acids are NOT DETECTED.  The SARS-CoV-2 RNA is generally detectable in upper and lower respiratory specimens during the acute phase of infection. Negative results do not preclude SARS-CoV-2 infection, do not rule out co-infections with other pathogens, and should not be used as the sole basis for treatment or other  patient management decisions. Negative results must be combined with clinical observations, patient history,  and epidemiological information. The expected result is Negative.  Fact Sheet for Patients: SugarRoll.be  Fact Sheet for Healthcare Providers: https://www.woods-mathews.com/  This test is not yet approved or cleared by the [place] FDA and  has been authorized for detection and/or diagnosis of SARS-CoV-2 by FDA under an Emergency Use Authorization (EUA). This EUA will remain  in effect (meaning this test can be used) for the duration of the COVID-19 declaration under Se ction 564(b)(1) of the Act, 21 U.S.C. section 360bbb-3(b)(1), unless the authorization is terminated or revoked sooner.  Performed at [facility], [place] [address], [place], [place] [address]      Radiology Studies: No results found.   LOS: 14 days   Antonieta Pert, MD [facility]  02/17/2021, 10:04 AM

## 2021-02-17 NOTE — Discharge Summary (Addendum)
Physician Discharge Summary  Brett White ZOX:[id number] DOB: May 10, 1961 DOA: 02/02/2021  PCP: Patient, [name]  Admit date: 02/02/2021 Discharge date: 02/19/2021  Admitted From: home Disposition:  SNF  Recommendations for Outpatient Follow-up:  1. Follow up with PCP in 1-2 weeks 2. Follow-up with orthopedics Dr. Lajoyce Corners in a week 3. Please follow up on the following pending results:  Home Health:NO  Equipment/Devices: NONE  Discharge Condition: Stable Code Status:   Code Status: Full Code Diet recommendation:  Diet Order            Diet Carb Modified           Diet Carb Modified Fluid consistency: Thin; Room service appropriate? Yes  Diet effective now                  Brief/Interim Summary: 60 year old male with T2DM, history of CVA, diabetic neuropathy, HTN presented due to worsening left foot pain and was found to have osteomyelitis placed on antibiotics and orthopedic surgery underwent left great toe amputation 5/7.  He has been having issue with pain control and was on iv and oral opiates-Followed by with PT OT orthopedics. Off iv opiates 5/18 and on oral meds.At this time a skilled nursing facility has been advised. Awaiting on SNF. Medically stable  Discharge Diagnoses:  Acute osteomyelitis of left great toe with left foot cellulitis:S/p left foot first ray amputation.  Patient was managed with antibiotics but discontinued post surgery.  Off wound VAC 5/19- wound approximated and healing well per orthopedics.  Continue dressing continue pain control with oral opiates and none opiates along with IBUprofen post meal with ppi/tums, along with muscle relaxants  Mild hyperkalemia resolved after IV fluids.  Limit potassium intake.    Uncontrolled type 2 diabetes mellitus with hyperglycemia: HbA1c 11.5 on admission. Only on metformin PTA.  Now placed on insulin -blood sugar control on Lantus 25 units and Premeal 5 units NovoLog and sliding scale.DM coordinator  following. Recent Labs  Lab 02/18/21 0650 02/18/21 1128 02/18/21 1559 02/18/21 2016 02/19/21 0623  GLUCAP 146* 185* 163* 230* 161*   Diabetic neuropathy suspected with lower extremity/foot numbness tingling going on for several weeks-patient is started on Neurontin.  Patient advised to follow-up with podiatry and educated on maintaining diabetic foot care  Essential hypertension: BP controlled.Home hctzlisinopril remains on hold.  He had issues w/ orthostasis.Monitor.  Fall 5/17 while returning to his bed from the bathroom.  Orthostatic on PT eval 5/16 minimize narcotic use monitor orthostatic vitals HCTZ and lisinopril has been discontinued.  Continue PT OT and skilled nursing facility placement  Diabetic neuropathy continue Neurontin.  Stable.  Hyponatremia likely from hyperglycemia monitor Consults:  othpedics  Subjective: Alert awake oriented not in acute distress complains of pain in the left foot. Discharge Exam: Vitals:   02/18/21 2217 02/19/21 0345  BP: 129/75 126/72  Pulse: 77 69  Resp: 15 16  Temp: 98 F (36.7 C) 98.4 F (36.9 C)  SpO2: 98% 98%   General: Pt is alert, awake, not in acute distress Cardiovascular: RRR, S1/S2 +, no rubs, no gallops Respiratory: CTA bilaterally, no wheezing, no rhonchi Abdominal: Soft, NT, ND, bowel sounds + Extremities: no edema, no cyanosis left. Left foot dressing intact.  Discharge Instructions  Discharge Instructions    Apply dressing   Complete by: As directed    May cleanse and shower with antibacterial soap and water apply clean dry dressing daily   Diet Carb Modified   Complete by: As directed  Discharge instructions   Complete by: As directed    CBC 1 wk Please call call MD or return to ER for similar or worsening recurring problem that brought you to hospital or if any fever,nausea/vomiting,abdominal pain, uncontrolled pain, chest pain,  shortness of breath or any other alarming symptoms.  Please follow-up  your doctor as instructed in a week time and call the office for appointment.  Please avoid alcohol, smoking, or any other illicit substance and maintain healthy habits including taking your regular medications as prescribed.  You were cared for by a hospitalist during your hospital stay. If you have any questions about your discharge medications or the care you received while you were in the hospital after you are discharged, you can call the unit and ask to speak with the hospitalist on call if the hospitalist that took care of you is not available.  Once you are discharged, your primary care physician will handle any further medical issues. Please note that NO REFILLS for any discharge medications will be authorized once you are discharged, as it is imperative that you return to your primary care physician (or establish a relationship with a primary care physician if you do not have one) for your aftercare needs so that they can reassess your need for medications and monitor your lab values   Discharge wound care:   Complete by: As directed    May cleanse and shower with antibacterial soap and water apply clean dry dressing daily   Increase activity slowly   Complete by: As directed    Touch down weight bearing   Complete by: As directed    Laterality: left   Extremity: Lower     Allergies as of 02/19/2021   No Known Allergies     Medication List    TAKE these medications   calcium carbonate 500 MG chewable tablet Commonly known as: TUMS - dosed in mg elemental calcium Chew 1 tablet (200 mg of elemental calcium total) by mouth 3 (three) times daily.   diphenhydramine-acetaminophen 25-500 MG Tabs tablet Commonly known as: TYLENOL PM Take 2 tablets by mouth at bedtime as needed.   docusate sodium 100 MG capsule Commonly known as: COLACE Take 1 capsule (100 mg total) by mouth daily.   gabapentin 300 MG capsule Commonly known as: NEURONTIN Take 2 capsules (600 mg total) by mouth  3 (three) times daily.   hydrochlorothiazide 12.5 MG tablet Commonly known as: HYDRODIURIL Take 1 tablet (12.5 mg total) by mouth daily.   ibuprofen 600 MG tablet Commonly known as: ADVIL Take 1 tablet (600 mg total) by mouth every 6 (six) hours as needed.   insulin aspart 100 UNIT/ML injection Commonly known as: novoLOG Inject 5 Units into the skin 3 (three) times daily with meals.   insulin glargine 100 UNIT/ML injection Commonly known as: LANTUS Inject 0.25 mLs (25 Units total) into the skin at bedtime.   methocarbamol 500 MG tablet Commonly known as: ROBAXIN Take 1 tablet (500 mg total) by mouth every 6 (six) hours as needed for muscle spasms.   oxyCODONE 5 MG immediate release tablet Commonly known as: Oxy IR/ROXICODONE Take 1-2 tablets (5-10 mg total) by mouth every 4 (four) hours as needed for moderate pain (pain score 4-6).   pantoprazole 40 MG tablet Commonly known as: PROTONIX Take 1 tablet (40 mg total) by mouth daily.   polyethylene glycol 17 g packet Commonly known as: MIRALAX / GLYCOLAX Take 17 g by mouth 2 (two) times daily.  senna-docusate 8.6-50 MG tablet Commonly known as: Senokot-S Take 1 tablet by mouth 2 (two) times daily.   zinc sulfate 220 (50 Zn) MG capsule Take 1 capsule (220 mg total) by mouth daily.            Discharge Care Instructions  (From admission, onward)         Start     Ordered   02/17/21 0000  Discharge wound care:       Comments: May cleanse and shower with antibacterial soap and water apply clean dry dressing daily   02/17/21 1004   02/04/21 0000  Touch down weight bearing       Question Answer Comment  Laterality left   Extremity Lower      02/04/21 0824          Contact information for follow-up providers    Adonis Huguenin, NP In 1 week.   Specialty: Orthopedic Surgery Contact information: [address] [place] [address] [phone]            Contact information for after-discharge care     Destination    [facility] Preferred SNF .   Service: Skilled Nursing Contact information: [address] [place] [address] [phone]                 No Known Allergies  The results of significant diagnostics from this hospitalization (including imaging, microbiology, ancillary and laboratory) are listed below for reference.    Microbiology: Recent Results (from the past 240 hour(s))  SARS CORONAVIRUS 2 (TAT 6-24 HRS) Nasopharyngeal Nasopharyngeal Swab     Status: None   Collection Time: 02/15/21 10:28 AM   Specimen: Nasopharyngeal Swab  Result Value Ref Range Status   SARS Coronavirus 2 NEGATIVE NEGATIVE Final    Comment: (NOTE) SARS-CoV-2 target nucleic acids are NOT DETECTED.  The SARS-CoV-2 RNA is generally detectable in upper and lower respiratory specimens during the acute phase of infection. Negative results do not preclude SARS-CoV-2 infection, do not rule out co-infections with other pathogens, and should not be used as the sole basis for treatment or other patient management decisions. Negative results must be combined with clinical observations, patient history, and epidemiological information. The expected result is Negative.  Fact Sheet for Patients: HairSlick.no  Fact Sheet for Healthcare Providers: quierodirigir.com  This test is not yet approved or cleared by the [place] FDA and  has been authorized for detection and/or diagnosis of SARS-CoV-2 by FDA under an Emergency Use Authorization (EUA). This EUA will remain  in effect (meaning this test can be used) for the duration of the COVID-19 declaration under Se ction 564(b)(1) of the Act, 21 U.S.C. section 360bbb-3(b)(1), unless the authorization is terminated or revoked sooner.  Performed at [facility], [address], [place], [place] [address]   Resp Panel by RT-PCR (Flu A&B, Covid)  Nasopharyngeal Swab     Status: None   Collection Time: 02/18/21  9:42 AM   Specimen: Nasopharyngeal Swab; Nasopharyngeal(NP) swabs in vial transport medium  Result Value Ref Range Status   SARS Coronavirus 2 by RT PCR NEGATIVE NEGATIVE Final    Comment: (NOTE) SARS-CoV-2 target nucleic acids are NOT DETECTED.  The SARS-CoV-2 RNA is generally detectable in upper respiratory specimens during the acute phase of infection. The lowest concentration of SARS-CoV-2 viral copies this assay can detect is 138 copies/mL. A negative result does not preclude SARS-Cov-2 infection and should not be used as the sole basis  for treatment or other patient management decisions. A negative result may occur with  improper specimen collection/handling, submission of specimen other than nasopharyngeal swab, presence of viral mutation(s) within the areas targeted by this assay, and inadequate number of viral copies(<138 copies/mL). A negative result must be combined with clinical observations, patient history, and epidemiological information. The expected result is Negative.  Fact Sheet for Patients:  BloggerCourse.com  Fact Sheet for Healthcare Providers:  SeriousBroker.it  This test is no t yet approved or cleared by the [place] FDA and  has been authorized for detection and/or diagnosis of SARS-CoV-2 by FDA under an Emergency Use Authorization (EUA). This EUA will remain  in effect (meaning this test can be used) for the duration of the COVID-19 declaration under Section 564(b)(1) of the Act, 21 U.S.C.section 360bbb-3(b)(1), unless the authorization is terminated  or revoked sooner.       Influenza A by PCR NEGATIVE NEGATIVE Final   Influenza B by PCR NEGATIVE NEGATIVE Final    Comment: (NOTE) The Xpert Xpress SARS-CoV-2/FLU/RSV plus assay is intended as an aid in the diagnosis of influenza from Nasopharyngeal swab specimens and should not be  used as a sole basis for treatment. Nasal washings and aspirates are unacceptable for Xpert Xpress SARS-CoV-2/FLU/RSV testing.  Fact Sheet for Patients: BloggerCourse.com  Fact Sheet for Healthcare Providers: SeriousBroker.it  This test is not yet approved or cleared by the [place] FDA and has been authorized for detection and/or diagnosis of SARS-CoV-2 by FDA under an Emergency Use Authorization (EUA). This EUA will remain in effect (meaning this test can be used) for the duration of the COVID-19 declaration under Section 564(b)(1) of the Act, 21 U.S.C. section 360bbb-3(b)(1), unless the authorization is terminated or revoked.  Performed at [facility], [address], [place], [place] [address]     Procedures/Studies: DG Chest 2 View  Result Date: 02/02/2021 CLINICAL DATA:  Suspected sepsis EXAM: CHEST - 2 VIEW COMPARISON:  None. FINDINGS: No consolidation, features of edema, pneumothorax, or effusion. Pulmonary vascularity is normally distributed. The cardiomediastinal contours are unremarkable. No acute osseous or soft tissue abnormality. Degenerative changes are present in the imaged spine and shoulders. IMPRESSION: No acute cardiopulmonary abnormality. Electronically Signed   By: Kreg Shropshire M.D.   On: 02/02/2021 22:38   MR FOOT LEFT WO CONTRAST  Result Date: 02/03/2021 CLINICAL DATA:  Osteomyelitis.  Hypertension and diabetes. EXAM: MRI OF THE LEFT FOOT WITHOUT CONTRAST TECHNIQUE: Multiplanar, multisequence MR imaging of the left forefoot was performed. No intravenous contrast was administered. COMPARISON:  Radiographs 02/02/2021 FINDINGS: Bones/Joint/Cartilage Erosions of the medial head of the proximal phalanx of the great toe and of the medial base of the distal phalanx of the great toe are present along with marrow edema in the distal phalanx and distally in the proximal phalanx. Although gout arthropathy could cause  a similar imaging appearance, the appearance is concerning for osteomyelitis given the clinical scenario. This also on appearance of suspected marrow edema in the distal phalanges of the third and fifth toes such that osteomyelitis cannot be excluded in these locations either. Notable arthropathy at the Lisfranc joint especially along the first digit dorsally, with subcortical marrow edema, degenerative subcortical cysts, and notable spurring. No malalignment at the Lisfranc joint. Ligaments The Lisfranc ligament appears intact. Muscles and Tendons Diffuse edema within regional musculature, probably neurogenic given the diffuse appearance. Soft tissues Dorsal subcutaneous edema in the foot tracking into the toes, nonspecific but cellulitis is not excluded. Edema  in the toes is most confluent in the great toe. IMPRESSION: 1. Substantial erosive arthropathy at the interphalangeal joint of the great toe medially, with associated marrow edema in the distal phalanx and distally in the proximal phalanx. Given the swelling in the toe and clinical context, the appearance is suspicious for osteomyelitis, with gout arthropathy being a less likely differential diagnostic consideration. 2. Marrow edema in the distal phalanx small toe could also be from osteomyelitis. Marrow edema in the distal phalanx of the third toe is not as convincing but is still abnormal. 3. Dorsal subcutaneous edema in the foot may be from cellulitis. 4. Diffuse muscular edema probably neurogenic. 5. Arthropathy at the Lisfranc joint especially medially. No Lisfranc joint malalignment, the Lisfranc ligament appears intact. Electronically Signed   By: Gaylyn Rong M.D.   On: 02/03/2021 08:19   DG Foot 2 Views Left  Result Date: 02/02/2021 CLINICAL DATA:  Infected wound great toe EXAM: LEFT FOOT - 2 VIEW COMPARISON:  None. FINDINGS: Extensive soft tissue swelling and ulceration of the first digit is seen medial to the interphalangeal joint with  subjacent areas of age-indeterminate ulceration on a background of more diffuse spurring. There is a questionable lucency through a osteophyte along the medial base of the first distal phalanx as well which could reflect an acute or cut subacute fracture. Few punctate indeterminate radiodensities are present in the soft tissues, possibly related to erosive changes. Additional degenerative changes including subcortical cystic features about the first interphalangeal joint as well. More diffuse degenerative changes throughout the foot elsewhere are fairly mild-to-moderate. No other acute or conspicuous osseous abnormalities. Bidirectional calcaneal spur. Vascular calcium in the soft tissues. IMPRESSION: Soft tissue swelling and ulceration along the medial first digit at the level of the interphalangeal joint. There is erosive change about the level of the first interphalangeal joint conspicuous for subjacent osteomyelitis. Questionable lucency through a large spur at the base of the first distal phalanx, could reflect a small fracture line as well. More diffuse mild-to-moderate degenerative changes throughout the foot. Bidirectional calcaneal spurs. Electronically Signed   By: Kreg Shropshire M.D.   On: 02/02/2021 22:41    Labs: BNP (last 3 results) No results for input(s): BNP in the last 8760 hours. Basic Metabolic Panel: Recent Labs  Lab 02/13/21 0826 02/15/21 0210 02/16/21 0444 02/16/21 1144  NA 132* 133* 135  --   K 4.5 5.0 5.3* 4.4  CL 95* 99 98  --   CO2 28 28 31   --   GLUCOSE 207* 247* 183*  --   BUN 29* 35* 32*  --   CREATININE 1.17 1.16 1.04  --   CALCIUM 9.0 8.8* 8.9  --    Liver Function Tests: No results for input(s): AST, ALT, ALKPHOS, BILITOT, PROT, ALBUMIN in the last 168 hours. No results for input(s): LIPASE, AMYLASE in the last 168 hours. No results for input(s): AMMONIA in the last 168 hours. CBC: Recent Labs  Lab 02/13/21 0826 02/15/21 0210  WBC 8.7 7.3  HGB 12.5*  10.3*  HCT 38.3* 32.4*  MCV 89.1 89.8  PLT 335 339   Cardiac Enzymes: No results for input(s): CKTOTAL, CKMB, CKMBINDEX, TROPONINI in the last 168 hours. BNP: Invalid input(s): POCBNP CBG: Recent Labs  Lab 02/18/21 0650 02/18/21 1128 02/18/21 1559 02/18/21 2016 02/19/21 0623  GLUCAP 146* 185* 163* 230* 161*   D-Dimer No results for input(s): DDIMER in the last 72 hours. Hgb A1c No results for input(s): HGBA1C in the last 72  hours. Lipid Profile No results for input(s): CHOL, HDL, LDLCALC, TRIG, CHOLHDL, LDLDIRECT in the last 72 hours. Thyroid function studies No results for input(s): TSH, T4TOTAL, T3FREE, THYROIDAB in the last 72 hours.  Invalid input(s): FREET3 Anemia work up No results for input(s): VITAMINB12, FOLATE, FERRITIN, TIBC, IRON, RETICCTPCT in the last 72 hours. Urinalysis    Component Value Date/Time   COLORURINE YELLOW 02/02/2021 2222   APPEARANCEUR HAZY (A) 02/02/2021 2222   LABSPEC 1.024 02/02/2021 2222   PHURINE 5.0 02/02/2021 2222   GLUCOSEU >=500 (A) 02/02/2021 2222   HGBUR MODERATE (A) 02/02/2021 2222   BILIRUBINUR NEGATIVE 02/02/2021 2222   KETONESUR NEGATIVE 02/02/2021 2222   PROTEINUR 30 (A) 02/02/2021 2222   NITRITE NEGATIVE 02/02/2021 2222   LEUKOCYTESUR NEGATIVE 02/02/2021 2222   Sepsis Labs Invalid input(s): PROCALCITONIN,  WBC,  LACTICIDVEN Microbiology Recent Results (from the past 240 hour(s))  SARS CORONAVIRUS 2 (TAT 6-24 HRS) Nasopharyngeal Nasopharyngeal Swab     Status: None   Collection Time: 02/15/21 10:28 AM   Specimen: Nasopharyngeal Swab  Result Value Ref Range Status   SARS Coronavirus 2 NEGATIVE NEGATIVE Final    Comment: (NOTE) SARS-CoV-2 target nucleic acids are NOT DETECTED.  The SARS-CoV-2 RNA is generally detectable in upper and lower respiratory specimens during the acute phase of infection. Negative results do not preclude SARS-CoV-2 infection, do not rule out co-infections with other pathogens, and should  not be used as the sole basis for treatment or other patient management decisions. Negative results must be combined with clinical observations, patient history, and epidemiological information. The expected result is Negative.  Fact Sheet for Patients: HairSlick.no  Fact Sheet for Healthcare Providers: quierodirigir.com  This test is not yet approved or cleared by the [place] FDA and  has been authorized for detection and/or diagnosis of SARS-CoV-2 by FDA under an Emergency Use Authorization (EUA). This EUA will remain  in effect (meaning this test can be used) for the duration of the COVID-19 declaration under Se ction 564(b)(1) of the Act, 21 U.S.C. section 360bbb-3(b)(1), unless the authorization is terminated or revoked sooner.  Performed at [facility], [address], [place], [place] [address]   Resp Panel by RT-PCR (Flu A&B, Covid) Nasopharyngeal Swab     Status: None   Collection Time: 02/18/21  9:42 AM   Specimen: Nasopharyngeal Swab; Nasopharyngeal(NP) swabs in vial transport medium  Result Value Ref Range Status   SARS Coronavirus 2 by RT PCR NEGATIVE NEGATIVE Final    Comment: (NOTE) SARS-CoV-2 target nucleic acids are NOT DETECTED.  The SARS-CoV-2 RNA is generally detectable in upper respiratory specimens during the acute phase of infection. The lowest concentration of SARS-CoV-2 viral copies this assay can detect is 138 copies/mL. A negative result does not preclude SARS-Cov-2 infection and should not be used as the sole basis for treatment or other patient management decisions. A negative result may occur with  improper specimen collection/handling, submission of specimen other than nasopharyngeal swab, presence of viral mutation(s) within the areas targeted by this assay, and inadequate number of viral copies(<138 copies/mL). A negative result must be combined with clinical observations,  patient history, and epidemiological information. The expected result is Negative.  Fact Sheet for Patients:  BloggerCourse.com  Fact Sheet for Healthcare Providers:  SeriousBroker.it  This test is no t yet approved or cleared by the [place] FDA and  has been authorized for detection and/or diagnosis of SARS-CoV-2 by FDA under an Emergency Use Authorization (EUA). This EUA  will remain  in effect (meaning this test can be used) for the duration of the COVID-19 declaration under Section 564(b)(1) of the Act, 21 U.S.C.section 360bbb-3(b)(1), unless the authorization is terminated  or revoked sooner.       Influenza A by PCR NEGATIVE NEGATIVE Final   Influenza B by PCR NEGATIVE NEGATIVE Final    Comment: (NOTE) The Xpert Xpress SARS-CoV-2/FLU/RSV plus assay is intended as an aid in the diagnosis of influenza from Nasopharyngeal swab specimens and should not be used as a sole basis for treatment. Nasal washings and aspirates are unacceptable for Xpert Xpress SARS-CoV-2/FLU/RSV testing.  Fact Sheet for Patients: BloggerCourse.com  Fact Sheet for Healthcare Providers: SeriousBroker.it  This test is not yet approved or cleared by the [place] FDA and has been authorized for detection and/or diagnosis of SARS-CoV-2 by FDA under an Emergency Use Authorization (EUA). This EUA will remain in effect (meaning this test can be used) for the duration of the COVID-19 declaration under Section 564(b)(1) of the Act, 21 U.S.C. section 360bbb-3(b)(1), unless the authorization is terminated or revoked.  Performed at [facility], [address], [place], [place] [address]      Time coordinating discharge: >25 minutes  SIGNED: Lanae Boast, MD  [facility] 02/19/2021, 10:21 AM  If 7PM-7AM, please contact night-coverage [email or web address]

## 2021-02-17 NOTE — TOC Progression Note (Signed)
Transition of Care [facility]) - Progression Note    Patient Details  Name: Brett White MRN: [id number] Date of Birth: 1961-02-18  Transition of [facility]) CM/SW Contact  Epifanio Lesches, RN Phone Number: 02/17/2021, 10:52 AM  Clinical Narrative:    Insurance authorization pending for SNF placement  .Marland KitchenMarland KitchenMarland KitchenOnce approval received pt will transition to [facility].  TOC team will continue to monitor and assist with TOC needs...   Expected Discharge Plan: Skilled Nursing Facility Barriers to Discharge: Insurance Authorization  Expected Discharge Plan and Services Expected Discharge Plan: Skilled Nursing Facility In-house Referral: Clinical Social Work Discharge Planning Services: CM Consult Post Acute Care Choice: Skilled Nursing Facility Living arrangements for the past 2 months: Apartment Expected Discharge Date: 02/17/21               DME Arranged: N/A DME Agency: NA       HH Arranged: NA HH Agency: NA         Social Determinants of Health (SDOH) Interventions    Readmission Risk Interventions No flowsheet data found.

## 2021-02-18 LAB — GLUCOSE, CAPILLARY
Glucose-Capillary: 146 mg/dL — ABNORMAL HIGH (ref 70–99)
Glucose-Capillary: 185 mg/dL — ABNORMAL HIGH (ref 70–99)
Glucose-Capillary: 163 mg/dL — ABNORMAL HIGH (ref 70–99)
Glucose-Capillary: 230 mg/dL — ABNORMAL HIGH (ref 70–99)

## 2021-02-18 LAB — RESP PANEL BY RT-PCR (FLU A&B, COVID) ARPGX2
Influenza A by PCR: NEGATIVE
Influenza B by PCR: NEGATIVE
SARS Coronavirus 2 by RT PCR: NEGATIVE

## 2021-02-18 NOTE — Plan of Care (Signed)
°  Problem: Clinical Measurements: °Goal: Will remain free from infection °Outcome: Progressing °  °Problem: Activity: °Goal: Risk for activity intolerance will decrease °Outcome: Progressing °  °Problem: Nutrition: °Goal: Adequate nutrition will be maintained °Outcome: Progressing °  °

## 2021-02-18 NOTE — TOC Progression Note (Addendum)
Transition of Care [facility]) - Progression Note    Patient Details  Name: Brett White MRN: [id number] Date of Birth: 15-Jun-1961  Transition of Care [facility]) CM/SW Contact  Levada Schilling Phone Number: 02/18/2021, 12:56 PM  Clinical Narrative:    CSW spoke with the Admin at [facility].  SNF will not be able to receive pt today due to not having a room ready for pt.  Pt can discharge to SNF on Sunday.  TOC will continue to assist with disposition planning.   Expected Discharge Plan: Skilled Nursing Facility Barriers to Discharge: Insurance Authorization  Expected Discharge Plan and Services Expected Discharge Plan: Skilled Nursing Facility In-house Referral: Clinical Social Work Discharge Planning Services: CM Consult Post Acute Care Choice: Skilled Nursing Facility Living arrangements for the past 2 months: Apartment Expected Discharge Date: 02/17/21               DME Arranged: N/A DME Agency: NA       HH Arranged: NA HH Agency: NA         Social Determinants of Health (SDOH) Interventions    Readmission Risk Interventions No flowsheet data found.

## 2021-02-19 LAB — GLUCOSE, CAPILLARY: Glucose-Capillary: 161 mg/dL — ABNORMAL HIGH (ref 70–99)

## 2021-02-19 NOTE — Progress Notes (Signed)
Seen and examined this morning. Patient did not leave yesterday and has a bed today.  Okay for discharge. Discharge summary updated this morning

## 2021-02-19 NOTE — TOC Transition Note (Signed)
Transition of Care [facility]) - CM/SW Discharge Note   Patient Details  Name: Armarion Greek MRN: [id number] Date of Birth: [birth year]  Transition of Care [facility]) CM/SW Contact:  Levada Schilling Phone Number: 02/19/2021, 10:33 AM   Clinical Narrative:     Patient will Discharge To: [facility] Anticipated DC Date:02/19/21 Family Notified:yes, ex wife Marisa Sprinkles [phone] Transport YS:AYTK   Per MD patient ready for DC to [facility] . RN, patient, patient's family, and facility notified of DC. Assessment, Fl2/Pasrr, and Discharge Summary sent to facility. RN given number for report [phone], Room # [phone]). DC packet on chart. Ambulance transport requested for patient.   CSW signing off.  Budd Palmer LCSWA [phone]    Final next level of care: Skilled Nursing Facility Barriers to Discharge: No Barriers Identified   Patient Goals and CMS Choice Patient states their goals for this hospitalization and ongoing recovery are:: to get better CMS Medicare.gov Compare Post Acute Care list provided to:: Patient Choice offered to / list presented to : Patient  Discharge Placement              Patient chooses bed at: [facility] Patient to be transferred to facility by: [facility] Name of family member notified: Marisa Sprinkles, ex wife Patient and family notified of of transfer: 02/19/21  Discharge Plan and Services In-house Referral: Clinical Social Work Discharge Planning Services: CM Consult Post Acute Care Choice: Skilled Nursing Facility          DME Arranged: N/A DME Agency: NA       HH Arranged: NA HH Agency: NA        Social Determinants of Health (SDOH) Interventions     Readmission Risk Interventions No flowsheet data found.

## 2021-02-19 NOTE — TOC Progression Note (Addendum)
Transition of Care [facility]) - Progression Note    Patient Details  Name: Khaleb Broz MRN: [id number] Date of Birth: [birth year]  Transition of Care [facility]) CM/SW Contact  Levada Schilling Phone Number: 02/19/2021, 9:30 AM  Clinical Narrative:    CSW spoke with Administrator Alinda Money at [facility].  Facility would like for pt to discharge to SNF on Monday, May 23 due to bed availability. Administrator Alinda Money assured CSW pt will be able to be received at facility after 10:00am.  CSW sent secure message to physician & RN to update.  TOC will continue assist with disposition planning. 9:53am: Heide Scales contacted CSW.  Pt will be able to be received at [facility] today.  CSW updated physician & RN.  Expected Discharge Plan: Skilled Nursing Facility Barriers to Discharge: Insurance Authorization  Expected Discharge Plan and Services Expected Discharge Plan: Skilled Nursing Facility In-house Referral: Clinical Social Work Discharge Planning Services: CM Consult Post Acute Care Choice: Skilled Nursing Facility Living arrangements for the past 2 months: Apartment Expected Discharge Date: 02/17/21               DME Arranged: N/A DME Agency: NA       HH Arranged: NA HH Agency: NA         Social Determinants of Health (SDOH) Interventions    Readmission Risk Interventions No flowsheet data found.

## 2021-02-21 ENCOUNTER — Emergency Department (HOSPITAL_COMMUNITY)
Admission: EM | Admit: 2021-02-21 | Discharge: 2021-02-22 | Disposition: A | Discharge status: Home / Self Care | Payer: Medicare HMO | Attending: Emergency Medicine | Admitting: Emergency Medicine

## 2021-02-21 ENCOUNTER — Emergency Department (HOSPITAL_COMMUNITY): Payer: Medicare HMO

## 2021-02-21 ENCOUNTER — Other Ambulatory Visit: Payer: Self-pay

## 2021-02-21 DIAGNOSIS — Z794 Long term (current) use of insulin: Secondary | ICD-10-CM | POA: Diagnosis not present

## 2021-02-21 DIAGNOSIS — T50905A Adverse effect of unspecified drugs, medicaments and biological substances, initial encounter: Secondary | ICD-10-CM

## 2021-02-21 DIAGNOSIS — G8918 Other acute postprocedural pain: Secondary | ICD-10-CM | POA: Diagnosis not present

## 2021-02-21 DIAGNOSIS — M79672 Pain in left foot: Secondary | ICD-10-CM | POA: Insufficient documentation

## 2021-02-21 DIAGNOSIS — L03119 Cellulitis of unspecified part of limb: Secondary | ICD-10-CM

## 2021-02-21 DIAGNOSIS — I10 Essential (primary) hypertension: Secondary | ICD-10-CM | POA: Insufficient documentation

## 2021-02-21 DIAGNOSIS — R079 Chest pain, unspecified: Secondary | ICD-10-CM | POA: Diagnosis present

## 2021-02-21 DIAGNOSIS — E1165 Type 2 diabetes mellitus with hyperglycemia: Secondary | ICD-10-CM | POA: Insufficient documentation

## 2021-02-21 DIAGNOSIS — Z79899 Other long term (current) drug therapy: Secondary | ICD-10-CM | POA: Insufficient documentation

## 2021-02-21 DIAGNOSIS — R1110 Vomiting, unspecified: Secondary | ICD-10-CM | POA: Diagnosis not present

## 2021-02-21 DIAGNOSIS — E1140 Type 2 diabetes mellitus with diabetic neuropathy, unspecified: Secondary | ICD-10-CM | POA: Insufficient documentation

## 2021-02-21 LAB — LACTIC ACID, PLASMA: Lactic Acid, Venous: 1 mmol/L (ref 0.5–1.9)

## 2021-02-21 LAB — CBC WITH DIFFERENTIAL/PLATELET
Abs Immature Granulocytes: 0.02 10*3/uL (ref 0.00–0.07)
Basophils Absolute: 0 10*3/uL (ref 0.0–0.1)
Basophils Relative: 1 %
Eosinophils Absolute: 0.2 10*3/uL (ref 0.0–0.5)
Eosinophils Relative: 3 %
HCT: 36.8 % — ABNORMAL LOW (ref 39.0–52.0)
Hemoglobin: 11.9 g/dL — ABNORMAL LOW (ref 13.0–17.0)
Immature Granulocytes: 0 %
Lymphocytes Relative: 26 %
Lymphs Abs: 1.7 10*3/uL (ref 0.7–4.0)
MCH: 28.4 pg (ref 26.0–34.0)
MCHC: 32.3 g/dL (ref 30.0–36.0)
MCV: 87.8 fL (ref 80.0–100.0)
Monocytes Absolute: 0.5 10*3/uL (ref 0.1–1.0)
Monocytes Relative: 8 %
Neutro Abs: 4.1 10*3/uL (ref 1.7–7.7)
Neutrophils Relative %: 62 %
Platelets: 429 10*3/uL — ABNORMAL HIGH (ref 150–400)
RBC: 4.19 MIL/uL — ABNORMAL LOW (ref 4.22–5.81)
RDW: 11.9 % (ref 11.5–15.5)
WBC: 6.5 10*3/uL (ref 4.0–10.5)
nRBC: 0 % (ref 0.0–0.2)

## 2021-02-21 LAB — COMPREHENSIVE METABOLIC PANEL
ALT: 11 U/L (ref 0–44)
AST: 14 U/L — ABNORMAL LOW (ref 15–41)
Albumin: 2.8 g/dL — ABNORMAL LOW (ref 3.5–5.0)
Alkaline Phosphatase: 60 U/L (ref 38–126)
Anion gap: 6 (ref 5–15)
BUN: 20 mg/dL (ref 6–20)
CO2: 28 mmol/L (ref 22–32)
Calcium: 8.6 mg/dL — ABNORMAL LOW (ref 8.9–10.3)
Chloride: 101 mmol/L (ref 98–111)
Creatinine, Ser: 1.09 mg/dL (ref 0.61–1.24)
GFR, Estimated: >60 mL/min (ref >60)
Glucose, Bld: 157 mg/dL — ABNORMAL HIGH (ref 70–99)
Potassium: 4 mmol/L (ref 3.5–5.1)
Sodium: 135 mmol/L (ref 135–145)
Total Bilirubin: 0.6 mg/dL (ref 0.3–1.2)
Total Protein: 7.2 g/dL (ref 6.5–8.1)

## 2021-02-21 LAB — TROPONIN I (HIGH SENSITIVITY): Troponin I (High Sensitivity): 4 ng/L (ref <18)

## 2021-02-21 LAB — LIPASE, BLOOD: Lipase: 22 U/L (ref 11–51)

## 2021-02-21 IMAGING — DX DG FOOT COMPLETE 3+V*L*
3 series · 3 of 3 positions shown · non-contrast
Comparison: Preoperative imaging [DATE]

CLINICAL DATA: Left foot swelling. Recent surgery. Rule out
osteomyelitis.

EXAM:
LEFT FOOT - COMPLETE 3+ VIEW

[foot ap]
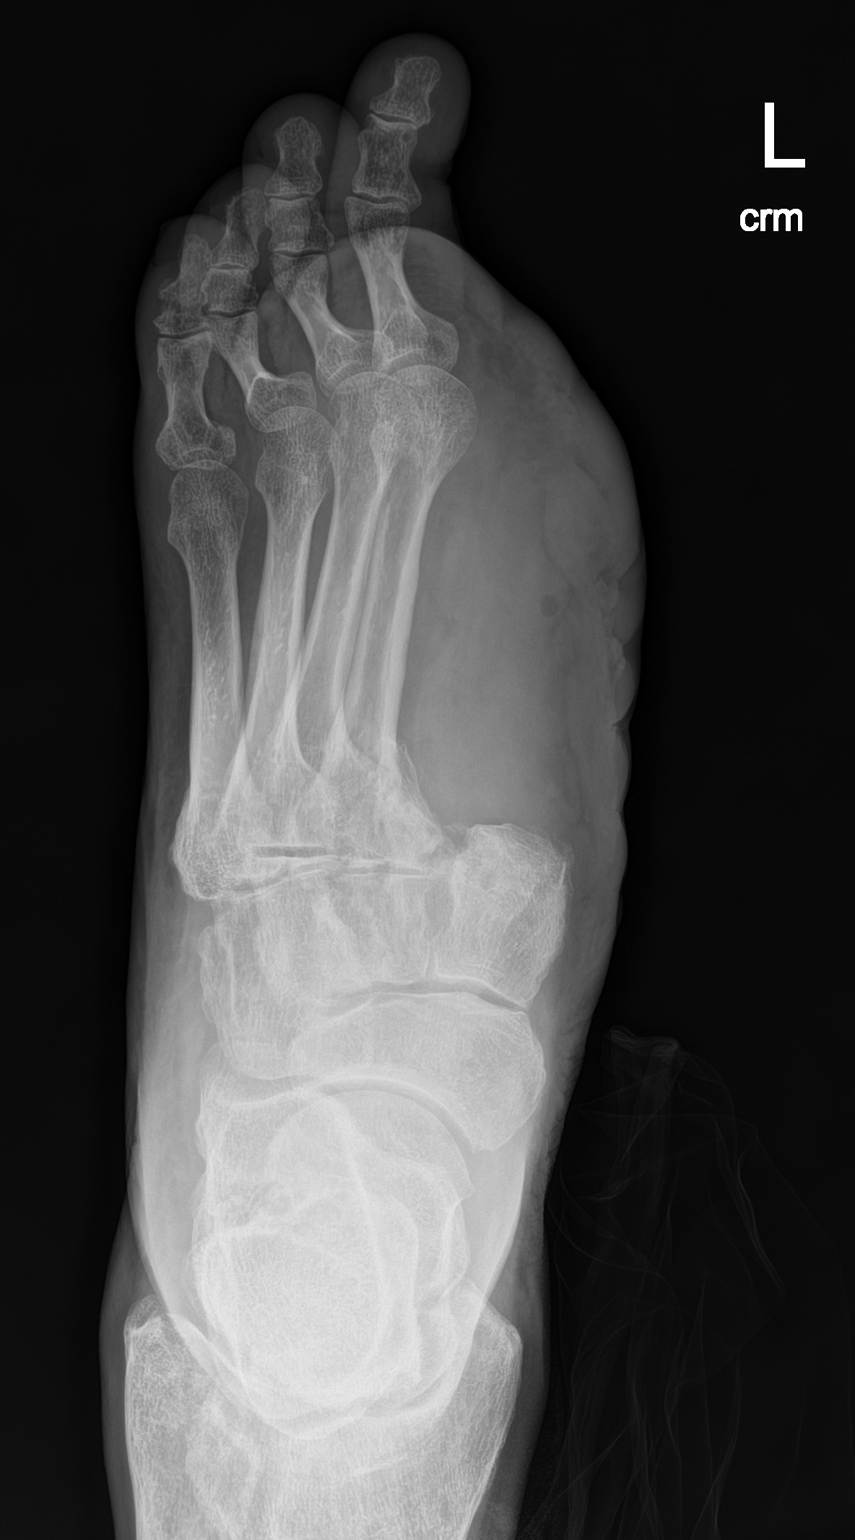

[foot obl]
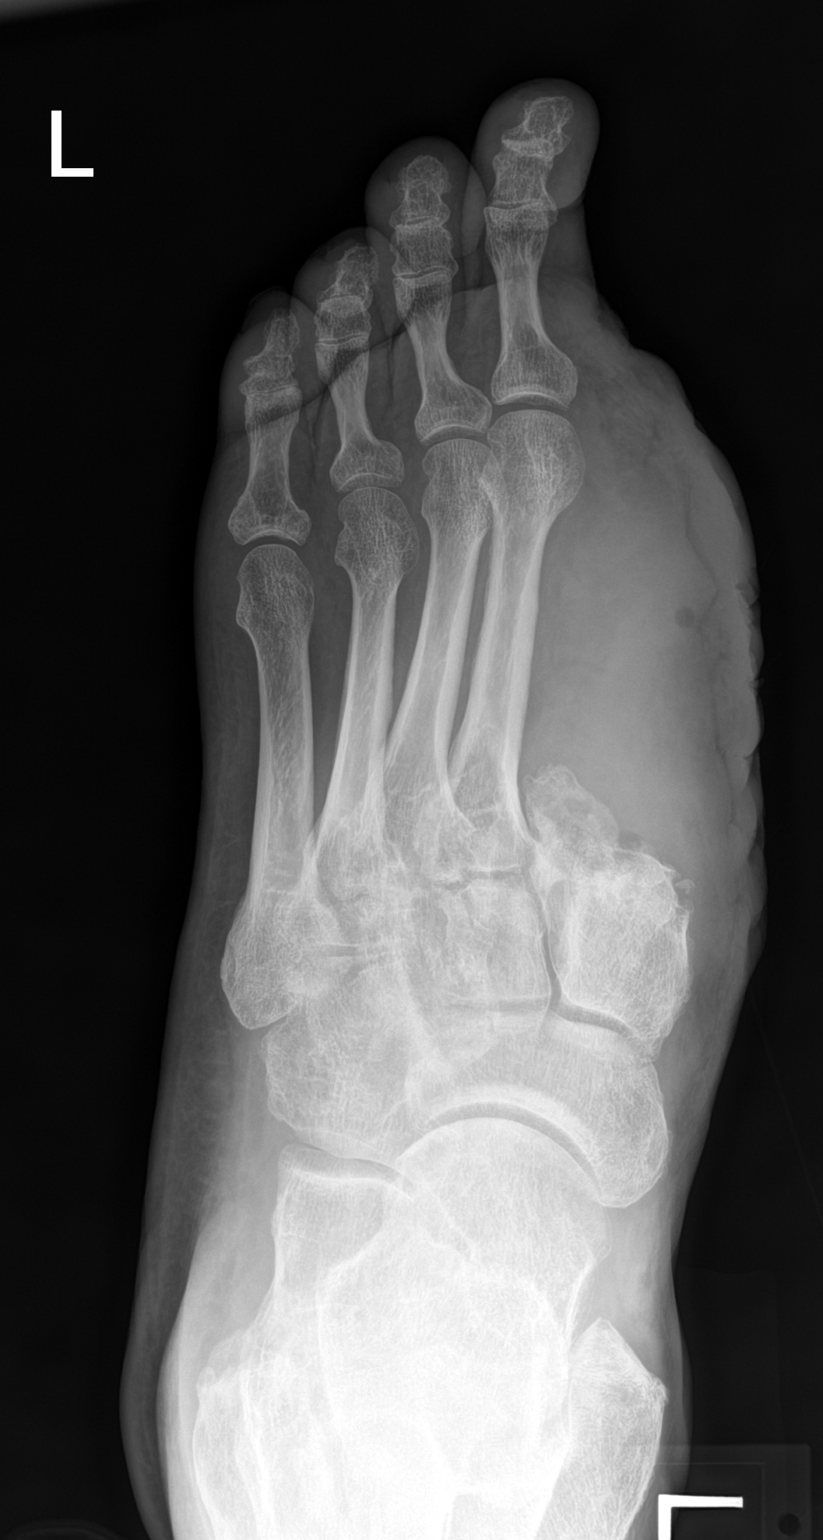

[foot lat]
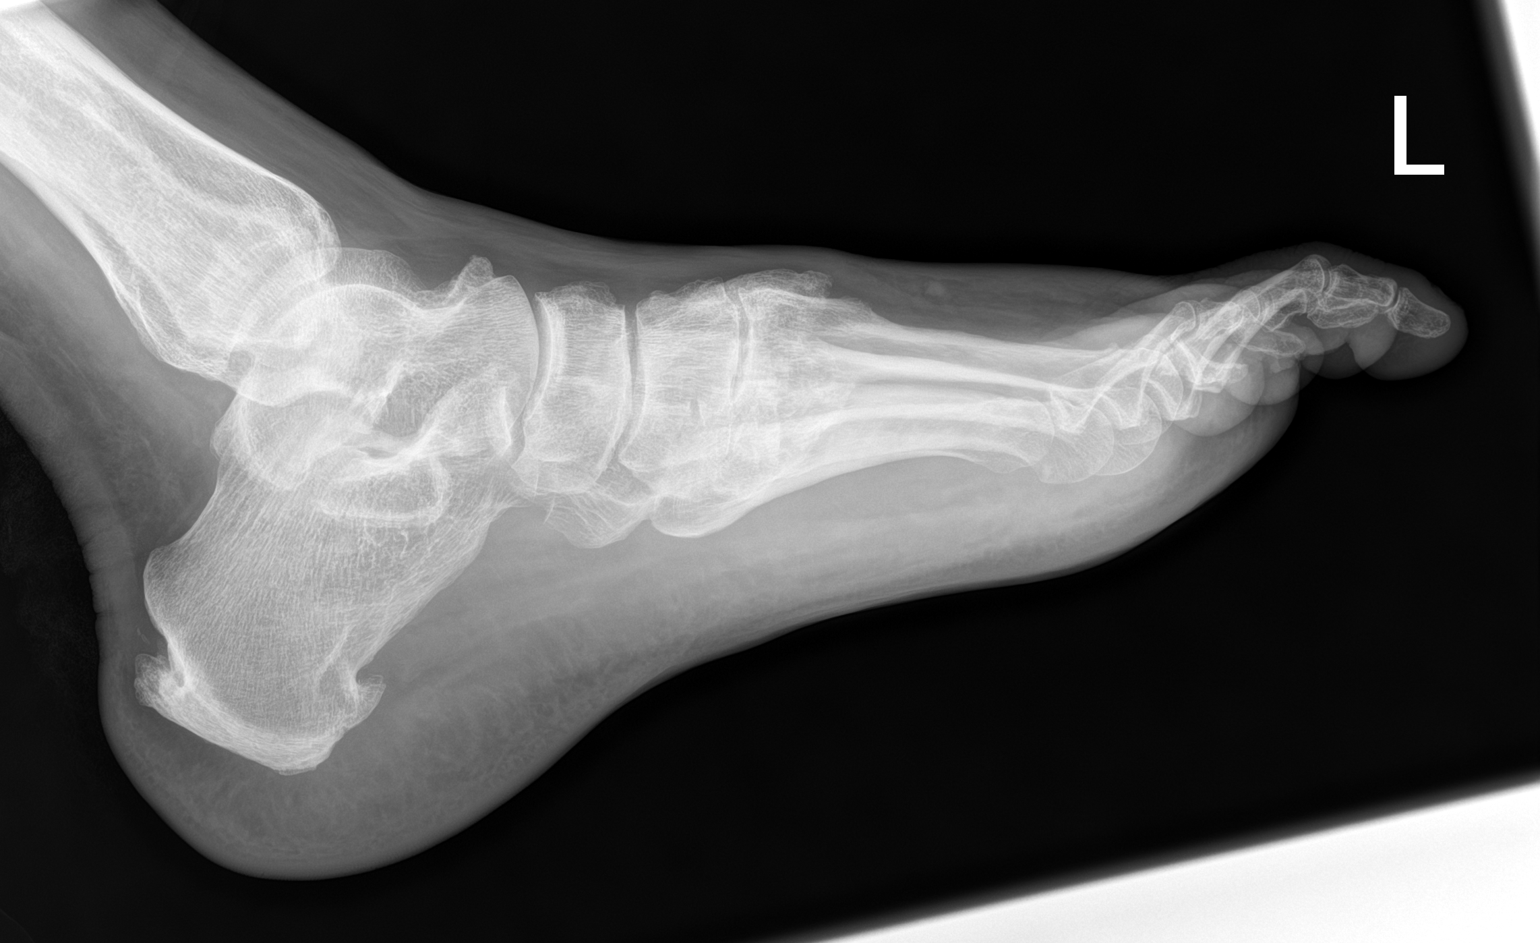

[3 of 3 positions shown; findings below may reference images not displayed]

FINDINGS: Resection of the first ray at the proximal metatarsal, resection
margin is smooth. Soft tissue edema throughout the operative bed
with occasional foci of air. There is a small osseous density about
the medial aspect of the medial cuneiform that was not definitively
seen on prior. No other erosive or destructive change. Mild
hammertoe deformity of the digits. No radiopaque foreign body.
Moderate plantar calcaneal spur and Achilles tendon enthesophyte.
IMPRESSION: 1. Post resection of the first ray at the proximal metatarsal. Soft
tissue edema throughout the operative bed with occasional foci of
air. This may be due to postsurgical change or soft tissue
infection.
2. Small osseous density about the medial aspect of the medial
cuneiform, not definitively seen on prior, unclear if this is
postsurgical or may represent early osteomyelitis.

## 2021-02-21 IMAGING — DX DG CHEST 1V PORT
1 series · 1 of 1 positions shown · non-contrast
Comparison: [DATE]

CLINICAL DATA: Chest pain.

EXAM:
PORTABLE CHEST 1 VIEW

[chest ap]
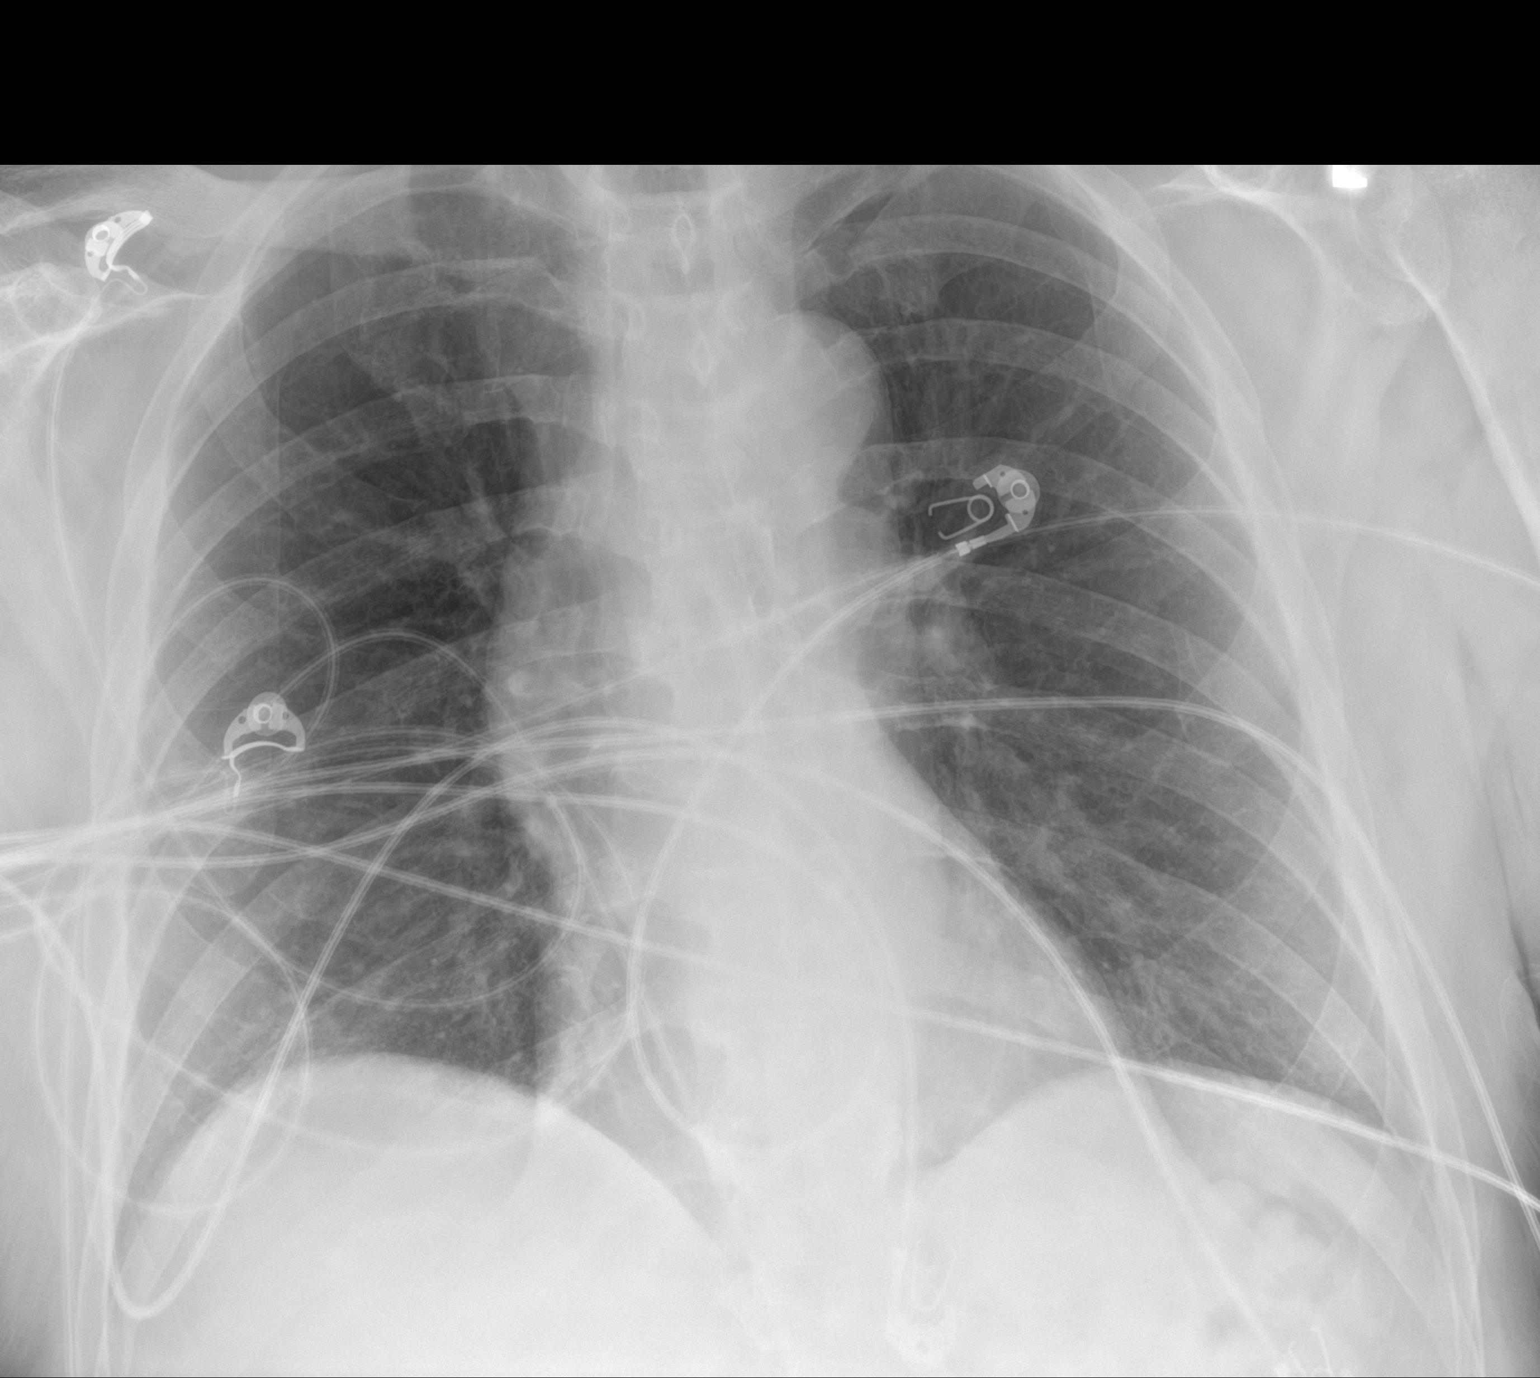

[1 of 1 positions shown; findings below may reference images not displayed]

FINDINGS: Stable heart size and mediastinal contours. Mild aortic tortuosity.
Minor bibasilar atelectasis. No confluent consolidation. No pleural
effusion or pneumothorax. No acute osseous abnormalities are seen.
IMPRESSION: Minor bibasilar atelectasis.

## 2021-02-21 MED ORDER — ONDANSETRON 4 MG PO TBDP: ORAL_TABLET | ORAL | 0 refills | Status: DC

## 2021-02-21 MED ORDER — HYDROMORPHONE HCL 1 MG/ML IJ SOLN
1.0000 mg | Freq: Once | INTRAMUSCULAR | Status: AC
  Administered 2021-02-21: 1 mg via INTRAVENOUS
  Filled 2021-02-21: qty 1

## 2021-02-21 MED ORDER — SODIUM CHLORIDE 0.9 % IV BOLUS
1000.0000 mL | Freq: Once | INTRAVENOUS | Status: AC
  Administered 2021-02-21: 1000 mL via INTRAVENOUS

## 2021-02-21 MED ORDER — ONDANSETRON HCL 4 MG/2ML IJ SOLN
4.0000 mg | Freq: Once | INTRAMUSCULAR | Status: AC
  Administered 2021-02-21: 4 mg via INTRAVENOUS
  Filled 2021-02-21: qty 2

## 2021-02-21 MED ORDER — MORPHINE SULFATE (PF) 4 MG/ML IV SOLN
4.0000 mg | Freq: Once | INTRAVENOUS | Status: AC
  Administered 2021-02-21: 4 mg via INTRAVENOUS
  Filled 2021-02-21: qty 1

## 2021-02-21 NOTE — ED Triage Notes (Signed)
EMS arrival from [facility] co chest pain radiating to L arm, headache, n/v, dizziness x2 hrs. Per EMS pt started on new antibiotics causing n/v. 1 Nitro, 324mg  Asp, 4mg  zofran administered in route, L big toe amputation , pain to surgical site.

## 2021-02-21 NOTE — Discharge Instructions (Signed)
You likely experiencing side effects from your antibiotics  You may take Zofran when you take your doxycycline  Please continue taking doxycycline  Please keep your follow-up with orthopedic doctor this week  Return to ER if you have worse purulent drainage, fever, worsening chest pain or foot pain.

## 2021-02-21 NOTE — ED Provider Notes (Signed)
[facility]   CSN: [id number] Arrival date & time: 02/21/21  1920     History Chief Complaint  Patient presents with  . Chest Pain    Brett ChurchJames White is a 60 y.o. male hx of DM, big toe osteomyelitis here presenting with chest pain and foot pain.  Patient had a left big toe amputation done by Dr. Lajoyce Cornersuda on 5/13.  He was admitted to the hospital at that time and discharged 4 days ago to a nursing facility.  He states that since yesterday he has worsening pain.  He had some purulent discharge from the wound as well.  Patient was started on doxycycline yesterday.  He had 2 doses started vomiting today.  He has worsening pain in his foot as well as chest pain after he vomits.  Denies any abdominal pain.  The history is provided by the patient.       Past Medical History:  Diagnosis Date  . Diabetes mellitus without complication [facility](HCC)     Patient Active Problem List   Diagnosis Date Noted  . 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  . Uncontrolled type 2 diabetes mellitus with hyperglycemia, without long-term current use of insulin (HCC) 02/03/2021  . Essential hypertension 02/03/2021  . Lactic acidosis 02/03/2021  . Diabetic peripheral neuropathy associated with type 2 diabetes mellitus (HCC) 02/03/2021  . Osteomyelitis (HCC) 02/03/2021  . Cellulitis of toe of left foot     Past Surgical History:  Procedure Laterality Date  . AMPUTATION Left 02/04/2021   Procedure: LEFT GREAT TOE AMPUTATION;  Surgeon: Nadara Mustarduda, Marcus V, MD;  [facility];  Service: Orthopedics;  Laterality: Left;  . AMPUTATION Left 02/10/2021   Procedure: LEFT FOOT 1ST RAY AMPUTATION;  Surgeon: Nadara Mustarduda, Marcus V, MD;  [facility];  Service: Orthopedics;  Laterality: Left;  . APPENDECTOMY    . BACK SURGERY    . TONSILLECTOMY         Family History  Problem Relation Age of Onset  . Heart attack Neg Hx     Social History   Tobacco Use   . Smoking status: Never Smoker  . Smokeless tobacco: Never Used  Substance Use Topics  . Alcohol use: Not Currently  . Drug use: Not Currently    Home Medications Prior to Admission medications   Medication Sig Start Date End Date Taking? Authorizing Provider  calcium carbonate (TUMS - DOSED IN MG ELEMENTAL CALCIUM) 500 MG chewable tablet Chew 1 tablet (200 mg of elemental calcium total) by mouth 3 (three) times daily. 02/17/21   Lanae BoastKc, Ramesh, MD  diphenhydramine-acetaminophen (TYLENOL PM) 25-500 MG TABS tablet Take 2 tablets by mouth at bedtime as needed.    [provider]  docusate sodium (COLACE) 100 MG capsule Take 1 capsule (100 mg total) by mouth daily. 02/18/21   Lanae BoastKc, Ramesh, MD  gabapentin (NEURONTIN) 300 MG capsule Take 2 capsules (600 mg total) by mouth 3 (three) times daily. 02/17/21   Lanae BoastKc, Ramesh, MD  hydrochlorothiazide (HYDRODIURIL) 12.5 MG tablet Take 1 tablet (12.5 mg total) by mouth daily. 11/26/20   Roxy HorsemanBrowning, Robert, PA-C  ibuprofen (ADVIL) 600 MG tablet Take 1 tablet (600 mg total) by mouth every 6 (six) hours as needed. Patient not taking: Reported on 02/03/2021 11/05/20   Dartha LodgeFord, Kelsey N, PA-C  insulin aspart (NOVOLOG) 100 UNIT/ML injection Inject 5 Units into the skin 3 (three) times daily with meals. 02/17/21   Lanae BoastKc, Ramesh, MD  insulin  glargine (LANTUS) 100 UNIT/ML injection Inject 0.25 mLs (25 Units total) into the skin at bedtime. 02/17/21   Lanae Boast, MD  methocarbamol (ROBAXIN) 500 MG tablet Take 1 tablet (500 mg total) by mouth every 6 (six) hours as needed for muscle spasms. 02/17/21   Lanae Boast, MD  oxyCODONE (OXY IR/ROXICODONE) 5 MG immediate release tablet Take 1-2 tablets (5-10 mg total) by mouth every 4 (four) hours as needed for moderate pain (pain score 4-6). 02/14/21   Persons, [place], PA  pantoprazole (PROTONIX) 40 MG tablet Take 1 tablet (40 mg total) by mouth daily. 02/18/21   Lanae Boast, MD  polyethylene glycol (MIRALAX / GLYCOLAX) 17 g packet Take 17 g by  mouth 2 (two) times daily. 02/17/21   Lanae Boast, MD  senna-docusate (SENOKOT-S) 8.6-50 MG tablet Take 1 tablet by mouth 2 (two) times daily. 02/17/21   Lanae Boast, MD  zinc sulfate 220 (50 Zn) MG capsule Take 1 capsule (220 mg total) by mouth daily. 02/18/21   Lanae Boast, MD    Allergies    Patient has no known allergies.  Review of Systems   Review of Systems  Cardiovascular: Positive for chest pain.  Musculoskeletal:       Left foot pain  All other systems reviewed and are negative.   Physical Exam Updated Vital Signs BP (!) 153/99   Pulse 78   Temp 97.9 F (36.6 C) (Oral)   Resp 10   SpO2 100%   Physical Exam Vitals and nursing note reviewed.  Constitutional:      Comments: Chronically ill, uncomfortable  HENT:     Head: Normocephalic.  Eyes:     Extraocular Movements: Extraocular movements intact.     Pupils: Pupils are equal, round, and reactive to light.  Cardiovascular:     Rate and Rhythm: Normal rate and regular rhythm.     Heart sounds: Normal heart sounds.  Pulmonary:     Effort: Pulmonary effort is normal.  Abdominal:     General: Bowel sounds are normal.     Palpations: Abdomen is soft.  Musculoskeletal:     Cervical back: Normal range of motion and neck supple.     Comments: Left foot wound with surrounding redness.  Patient has diminished pulses.  No obvious purulent discharge.  The sutures appears to be in place  Skin:    Capillary Refill: Capillary refill takes less than 2 seconds.     Comments: Erythema around the amputation site  Neurological:     General: No focal deficit present.     Mental Status: He is oriented to person, place, and time.  Psychiatric:        Mood and Affect: Mood normal.        Behavior: Behavior normal.       ED Results / Procedures / Treatments   Labs (all labs ordered are listed, but only abnormal results are displayed) Labs Reviewed  CBC WITH DIFFERENTIAL/PLATELET  COMPREHENSIVE METABOLIC PANEL  TROPONIN I  (HIGH SENSITIVITY)  TROPONIN I (HIGH SENSITIVITY)    EKG EKG Interpretation  Date/Time:  Tuesday Feb 21 2021 19:28:54 EDT Ventricular Rate:  88 PR Interval:  173 QRS Duration: 102 QT Interval:  372 QTC Calculation: 451 R Axis:   -29 Text Interpretation: Sinus rhythm Multiple ventricular premature complexes Borderline left axis deviation Probable anteroseptal infarct, old No significant change since last tracing Confirmed by Richardean Canal [phone]) on 02/21/2021 7:34:38 PM   Radiology DG Chest [facility] 1 [address]  Result Date: 02/21/2021 CLINICAL DATA:  Chest pain. EXAM: PORTABLE CHEST 1 VIEW COMPARISON:  02/02/2021 FINDINGS: Stable heart size and mediastinal contours. Mild aortic tortuosity. Minor bibasilar atelectasis. No confluent consolidation. No pleural effusion or pneumothorax. No acute osseous abnormalities are seen. IMPRESSION: Minor bibasilar atelectasis. Electronically Signed   By: Narda Rutherford M.D.   On: 02/21/2021 20:12   DG Foot Complete Left  Result Date: 02/21/2021 CLINICAL DATA:  Left foot swelling. Recent surgery. Rule out osteomyelitis. EXAM: LEFT FOOT - COMPLETE 3+ VIEW COMPARISON:  Preoperative imaging 02/02/2021 FINDINGS: Resection of the first ray at the proximal metatarsal, resection margin is smooth. Soft tissue edema throughout the operative bed with occasional foci of air. There is a small osseous density about the medial aspect of the medial cuneiform that was not definitively seen on prior. No other erosive or destructive change. Mild hammertoe deformity of the digits. No radiopaque foreign body. Moderate plantar calcaneal spur and Achilles tendon enthesophyte. IMPRESSION: 1. Post resection of the first ray at the proximal metatarsal. Soft tissue edema throughout the operative bed with occasional foci of air. This may be due to postsurgical change or soft tissue infection. 2. Small osseous density about the medial aspect of the medial cuneiform, not definitively seen on  prior, unclear if this is postsurgical or may represent early osteomyelitis. Electronically Signed   By: Narda Rutherford M.D.   On: 02/21/2021 20:15    Procedures Procedures   Medications Ordered in ED Medications  morphine 4 MG/ML injection 4 mg (has no administration in time range)  HYDROmorphone (DILAUDID) injection 1 mg (has no administration in time range)  sodium chloride 0.9 % bolus 1,000 mL (1,000 mLs Intravenous New Bag/Given 02/21/21 2000)  morphine 4 MG/ML injection 4 mg (4 mg Intravenous Given 02/21/21 2000)  ondansetron (ZOFRAN) injection 4 mg (4 mg Intravenous Given 02/21/21 2000)    ED Course  I have reviewed the triage vital signs and the nursing notes.  Pertinent labs & imaging results that were available during my care of the patient were reviewed by me and considered in my medical decision making (see chart for details).    MDM Rules/Calculators/A&P                         Jionni Helming is a 60 y.o. male here presenting with left foot pain and chest pain.  Patient recently had left big toe amputation is now postop day #11.  Plan to get x-rays of the foot to rule out osteo-.  We will also get CBC CMP and troponin x 1 (symptoms for at least a day).   11:43 PM Patient's x-ray showed some gas in the soft tissue.  Patient still has stitches in place and there is no purulent discharge.. think these are consistent with postop changes.  His lactate is normal and white blood cell count is normal.  Patient states that his foot is hurting more in his chest at this point.  I think the nausea vomiting likely side effect of doxycycline.  He was given Zofran and he felt better already.  I told him to take Zofran with his doxycycline.  He has Ortho follow-up in 3 days.  Stable for discharge back to facility.  He can continue his current pain medicine regimen.  Final Clinical Impression(s) / ED Diagnoses Final diagnoses:  None    Rx / DC Orders ED Discharge Orders    None  Charlynne Pander, MD 02/21/21 [phone]

## 2021-02-22 NOTE — ED Notes (Signed)
[facility] at bedside 

## 2021-02-24 ENCOUNTER — Ambulatory Visit (INDEPENDENT_AMBULATORY_CARE_PROVIDER_SITE_OTHER): Payer: Medicare HMO | Admitting: Physician Assistant

## 2021-02-24 ENCOUNTER — Encounter: Payer: Self-pay | Admitting: Physician Assistant

## 2021-02-24 DIAGNOSIS — M869 Osteomyelitis, unspecified: Secondary | ICD-10-CM

## 2021-02-24 NOTE — Progress Notes (Signed)
Office Visit Note   Patient: Brett White           Date of Birth: May 20, 1961           MRN: [id number] Visit Date: 02/24/2021              Requested by: No referring provider defined for this encounter. PCP: Patient, [name]  Chief Complaint  Patient presents with  . Left Foot - Routine Post Op    // left foot st ray amputation       HPI: Patient is 13 days status post left foot first ray amputation.  He has been at a nursing facility.  His biggest complaint is of pain.  He is asking for stronger pain medicine.  He is also asking about his right foot.  He says his toes do not have any feeling.  He is also lost his toenails.  He said this is been going on for about 3 weeks.  Assessment & Plan: Visit Diagnoses: No diagnosis found.  Plan: Patient has had significant pain issues since his first surgery which was amputating his great toe.  He should continue on antibiotics.  Follow-up in 1 week may begin cleansing with regards to the right foot I talked to him about Achilles stretching which he can do with a towel at the nursing facility  Follow-Up Instructions: No follow-ups on file.   Ortho Exam  Patient is alert, oriented, no adenopathy, well-dressed, normal affect, normal respiratory effort. Right foot no swelling no cellulitis he does have loss of the lesser toe toenails.  Doppler exam he has a strong biphasic pulse by Doppler.  No signs of infection.  No sausage digits he does have an Achilles contracture.  Left foot he does have moderate soft tissue swelling just around the wound but no fluctuance.  There are some erythema but no ascending cellulitis.  No foul odor.  X-rays from the emergency room where he went a couple days ago demonstrate the first ray amputation questionable lytic change in the cuneiform.  He should also get x-rays of his left foot at next visit  Imaging: No results found. No images are attached to the encounter.  Labs: Lab Results   Component Value Date   HGBA1C 11.5 (H) 02/03/2021   CRP 1.0 (H) 02/08/2021   CRP 0.8 02/05/2021   CRP 0.9 02/04/2021   REPTSTATUS PENDING 02/21/2021   CULT  02/21/2021    NO GROWTH 3 DAYS Performed at [facility], [address], [place], [place] [address]      Lab Results  Component Value Date   ALBUMIN 2.8 (L) 02/21/2021   ALBUMIN 3.5 02/03/2021   ALBUMIN 3.7 02/02/2021    Lab Results  Component Value Date   MG 1.9 02/08/2021   MG 1.9 02/05/2021   MG 2.1 02/04/2021   No results found for: VD25OH  No results found for: PREALBUMIN CBC EXTENDED Latest Ref Rng & Units 02/21/2021 02/15/2021 02/13/2021  WBC 4.0 - 10.5 K/uL 6.5 7.3 8.7  RBC 4.22 - 5.81 MIL/uL 4.19(L) 3.61(L) 4.30  HGB 13.0 - 17.0 g/dL 11.9(L) 10.3(L) 12.5(L)  HCT 39.0 - 52.0 % 36.8(L) 32.4(L) 38.3(L)  PLT 150 - 400 K/uL 429(H) 339 335  NEUTROABS 1.7 - 7.7 K/uL 4.1 - -  LYMPHSABS 0.7 - 4.0 K/uL 1.7 - -     There is no height or weight on file to calculate BMI.  Orders:  No orders of the defined types  were placed in this encounter.  No orders of the defined types were placed in this encounter.    Procedures: No procedures performed  Clinical Data: No additional findings.  ROS:  All other systems negative, except as noted in the HPI. Review of Systems  Objective: Vital Signs: There were no vitals taken for this visit.  Specialty Comments:  No specialty comments available.  PMFS History: Patient Active Problem List   Diagnosis Date Noted  . 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  . Uncontrolled type 2 diabetes mellitus with hyperglycemia, without long-term current use of insulin (HCC) 02/03/2021  . Essential hypertension 02/03/2021  . Lactic acidosis 02/03/2021  . Diabetic peripheral neuropathy associated with type 2 diabetes mellitus (HCC) 02/03/2021  . Osteomyelitis (HCC) 02/03/2021  . Cellulitis of toe of left foot    Past Medical History:  Diagnosis Date   . Diabetes mellitus without complication (HCC)     Family History  Problem Relation Age of Onset  . Heart attack Neg Hx     Past Surgical History:  Procedure Laterality Date  . AMPUTATION Left 02/04/2021   Procedure: LEFT GREAT TOE AMPUTATION;  Surgeon: [name], MD;  Location: [facility] OR;  Service: Orthopedics;  Laterality: Left;  . AMPUTATION Left 02/10/2021   Procedure: LEFT FOOT 1ST RAY AMPUTATION;  Surgeon: [name], MD;  Location: [facility] OR;  Service: Orthopedics;  Laterality: Left;  . APPENDECTOMY    . BACK SURGERY    . TONSILLECTOMY     Social History   Occupational History  . Not on file  Tobacco Use  . Smoking status: Never Smoker  . Smokeless tobacco: Never Used  Substance and Sexual Activity  . Alcohol use: Not Currently  . Drug use: Not Currently  . Sexual activity: Not on file

## 2021-02-26 LAB — CULTURE, BLOOD (ROUTINE X 2)
Culture: NO GROWTH
Special Requests: ADEQUATE

## 2021-03-02 ENCOUNTER — Other Ambulatory Visit: Payer: Self-pay | Admitting: Physician Assistant

## 2021-03-02 ENCOUNTER — Ambulatory Visit (INDEPENDENT_AMBULATORY_CARE_PROVIDER_SITE_OTHER): Payer: Medicare HMO | Admitting: Orthopedic Surgery

## 2021-03-02 ENCOUNTER — Encounter: Payer: Self-pay | Admitting: Orthopedic Surgery

## 2021-03-02 ENCOUNTER — Ambulatory Visit (INDEPENDENT_AMBULATORY_CARE_PROVIDER_SITE_OTHER): Payer: Medicare HMO

## 2021-03-02 ENCOUNTER — Other Ambulatory Visit: Payer: Self-pay

## 2021-03-02 VITALS — Ht 75.0 in | Wt 214.0 lb

## 2021-03-02 DIAGNOSIS — L03116 Cellulitis of left lower limb: Secondary | ICD-10-CM

## 2021-03-02 DIAGNOSIS — M869 Osteomyelitis, unspecified: Secondary | ICD-10-CM | POA: Diagnosis not present

## 2021-03-02 NOTE — Progress Notes (Signed)
Office Visit Note   Patient: Brett White           Date of Birth: 06/30/1961           MRN: [id number] Visit Date: 03/02/2021              Requested by: No referring provider defined for this encounter. PCP: Patient, [name]  Chief Complaint  Patient presents with  . Left Foot - Follow-up    Left 1st ray amputation 02/10/2021      HPI: Patient is a 60 year old gentleman who is 3 weeks status post left foot first ray amputation.  Patient went to the emergency room on 524 with concerns for possible infection he complains of increased pain and swelling.  He is currently on doxycycline he is on oxycodone on Cymbalta and Neurontin.  He complains of increased swelling and pain.  Assessment & Plan: Visit Diagnoses:  1. Osteomyelitis of great toe of left foot (HCC)   2. Cellulitis of left foot     Plan: Patient's pain is out of proportion to his clinical findings and concern for infection.  We will plan for surgical debridement tomorrow of the left foot.  Plan for cultures placement of a wound VAC and IV antibiotics for 3 days anticipate return back to skilled nursing next Monday.  Follow-Up Instructions: Return in about 1 week (around 03/09/2021).   Ortho Exam  Patient is alert, oriented, no adenopathy, well-dressed, normal affect, normal respiratory effort. Examination patient has a good dorsalis pedis pulse he has significant swelling in the foot and leg he can move the ankle he has no evidence of DVT clinically.  There is redness around the incision with good granulation tissue no wound ischemia no drainage no purulence.  With elevation and skin massage the redness resolves.  Clinically he does not have symptoms of infection but with the pain out of proportion to the clinical findings I am concerned for a deep abscess.  Imaging: XR Foot 2 Views Left  Result Date: 03/02/2021 2 view radiographs of the left foot shows the first ray amputation with some irregularity at  the distal aspect of the medial cuneiform.  No images are attached to the encounter.  Labs: Lab Results  Component Value Date   HGBA1C 11.5 (H) 02/03/2021   CRP 1.0 (H) 02/08/2021   CRP 0.8 02/05/2021   CRP 0.9 02/04/2021   REPTSTATUS 02/26/2021 FINAL 02/21/2021   CULT  02/21/2021    NO GROWTH 5 DAYS Performed at [facility], [address] [place], [place] [address]      Lab Results  Component Value Date   ALBUMIN 2.8 (L) 02/21/2021   ALBUMIN 3.5 02/03/2021   ALBUMIN 3.7 02/02/2021    Lab Results  Component Value Date   MG 1.9 02/08/2021   MG 1.9 02/05/2021   MG 2.1 02/04/2021   No results found for: VD25OH  No results found for: PREALBUMIN CBC EXTENDED Latest Ref Rng & Units 02/21/2021 02/15/2021 02/13/2021  WBC 4.0 - 10.5 K/uL 6.5 7.3 8.7  RBC 4.22 - 5.81 MIL/uL 4.19(L) 3.61(L) 4.30  HGB 13.0 - 17.0 g/dL 11.9(L) 10.3(L) 12.5(L)  HCT 39.0 - 52.0 % 36.8(L) 32.4(L) 38.3(L)  PLT 150 - 400 K/uL 429(H) 339 335  NEUTROABS 1.7 - 7.7 K/uL 4.1 - -  LYMPHSABS 0.7 - 4.0 K/uL 1.7 - -     Body mass index is 26.75 kg/m.  Orders:  Orders Placed This Encounter  Procedures  .  XR Foot 2 Views Left   No orders of the defined types were placed in this encounter.    Procedures: No procedures performed  Clinical Data: No additional findings.  ROS:  All other systems negative, except as noted in the HPI. Review of Systems  Objective: Vital Signs: Ht 6\' 3"  (1.905 m)   Wt 214 lb (97.1 kg)   BMI 26.75 kg/m   Specialty Comments:  No specialty comments available.  PMFS History: Patient Active Problem List   Diagnosis Date Noted  . 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  . Uncontrolled type 2 diabetes mellitus with hyperglycemia, without long-term current use of insulin (HCC) 02/03/2021  . Essential hypertension 02/03/2021  . Lactic acidosis 02/03/2021  . Diabetic peripheral neuropathy associated with type 2 diabetes mellitus (HCC)  02/03/2021  . Osteomyelitis (HCC) 02/03/2021  . Cellulitis of toe of left foot    Past Medical History:  Diagnosis Date  . Diabetes mellitus without complication (HCC)     Family History  Problem Relation Age of Onset  . Heart attack Neg Hx     Past Surgical History:  Procedure Laterality Date  . AMPUTATION Left 02/04/2021   Procedure: LEFT GREAT TOE AMPUTATION;  Surgeon: 04/06/2021, MD;  Location: [facility] OR;  Service: Orthopedics;  Laterality: Left;  . AMPUTATION Left 02/10/2021   Procedure: LEFT FOOT 1ST RAY AMPUTATION;  Surgeon: 02/12/2021, MD;  Location: [facility] OR;  Service: Orthopedics;  Laterality: Left;  . APPENDECTOMY    . BACK SURGERY    . TONSILLECTOMY     Social History   Occupational History  . Not on file  Tobacco Use  . Smoking status: Never Smoker  . Smokeless tobacco: Never Used  Substance and Sexual Activity  . Alcohol use: Not Currently  . Drug use: Not Currently  . Sexual activity: Not on file

## 2021-03-02 NOTE — Progress Notes (Signed)
Spoke with Selena Batten, LPN at [facility] where pt is residing at present time. She states pt is alert, oriented and can speak for himself. Pt is a type 2 diabetic. She was unable to pull up his CBG's but states she does not think he has had any high numbers recently. Last A1C was 11.5 on 02/03/21.   Selena Batten is going to send me the [facility] so I can get it to our pharmacy. I have sent her the pre-op instructions for pt's surgery tomorrow.   Pt will need a Covid test on arrival, he will be staying overnight after surgery.

## 2021-03-02 NOTE — Pre-Procedure Instructions (Signed)
Brett White  03/02/2021    [name]'s procedure is scheduled on Friday, 03/03/21 at 2:37 PM.   Report to [facility] Entrance "A" Admitting Office at 12:10 PM.   Call this number if you have problems the morning of surgery: [phone]   Remember:  Patient is not to eat food after midnight tonight.  Patient may drink clear liquids until 11:40 AM.  Clear liquids allowed are: Water, Juice (non-citric and without pulp - diabetics please choose diet or no sugar options), Carbonated beverages - (diabetics please choose diet or no sugar options), Clear Tea, Black Coffee only (no creamer, milk or cream including half and half) and Gatorade (diabetics please choose diet or no sugar options)    Take these medicines the morning of surgery with A SIP OF WATER: Gabapentin (Neurontin), Pantoprazole (Protonix), Oxycodone - if needed, Ondansetron (Zofran) - if needed, Methocarbamol (Robaxin) - prn  Hold NSAIDS (Ibuprofen, Aleve, etc.) prior to surgery.  Tonight, give patient 1/2 of his regular dose of Levemir Insulin tonight. He will get 12 units. In the AM, check patient's blood sugar when he gets up and every 2 hours until he leaves for the hospital. If blood sugar is 70 or below, treat with 1/2 cup of clear juice (apple or cranberry) and recheck blood sugar 15 minutes after drinking juice. If blood sugar continues to be 70 or below, call the Short Stay department and ask to speak to a nurse.    Do not wear jewelry.  Do not wear lotions, powders, cologne or deodorant.  Men may shave face and neck.  Do not bring valuables to the hospital.  [facility] is not responsible for any belongings or valuables.  Contacts, dentures or bridgework may not be worn into surgery.  Leave your suitcase in the car.  After surgery it may be brought to your room.  For patients admitted to the hospital, discharge time will be determined by your treatment team.  Patients discharged the day of surgery will not  be allowed to drive home.

## 2021-03-03 ENCOUNTER — Ambulatory Visit (HOSPITAL_COMMUNITY): Payer: Medicare HMO | Admitting: Certified Registered"

## 2021-03-03 ENCOUNTER — Other Ambulatory Visit: Payer: Self-pay

## 2021-03-03 ENCOUNTER — Inpatient Hospital Stay (HOSPITAL_COMMUNITY)
Admission: AD | Admit: 2021-03-03 | Discharge: 2021-03-15 | DRG: 857 | Disposition: A | Discharge status: Home Health | Payer: Medicare HMO | Attending: Orthopedic Surgery | Admitting: Orthopedic Surgery

## 2021-03-03 ENCOUNTER — Encounter (HOSPITAL_COMMUNITY): Payer: Self-pay | Admitting: Orthopedic Surgery

## 2021-03-03 ENCOUNTER — Encounter (HOSPITAL_COMMUNITY)
Admission: AD | Disposition: A | Discharge status: Home Health | Payer: Self-pay | Source: Home / Self Care | Attending: Orthopedic Surgery

## 2021-03-03 DIAGNOSIS — Y839 Surgical procedure, unspecified as the cause of abnormal reaction of the patient, or of later complication, without mention of misadventure at the time of the procedure: Secondary | ICD-10-CM | POA: Diagnosis present

## 2021-03-03 DIAGNOSIS — L02612 Cutaneous abscess of left foot: Secondary | ICD-10-CM | POA: Diagnosis not present

## 2021-03-03 DIAGNOSIS — Z89422 Acquired absence of other left toe(s): Secondary | ICD-10-CM | POA: Diagnosis not present

## 2021-03-03 DIAGNOSIS — Z20822 Contact with and (suspected) exposure to covid-19: Secondary | ICD-10-CM | POA: Diagnosis present

## 2021-03-03 DIAGNOSIS — L02212 Cutaneous abscess of back [any part, except buttock]: Secondary | ICD-10-CM | POA: Diagnosis present

## 2021-03-03 DIAGNOSIS — T8141XA Infection following a procedure, superficial incisional surgical site, initial encounter: Secondary | ICD-10-CM | POA: Diagnosis not present

## 2021-03-03 HISTORY — PX: I & D EXTREMITY: SHX5045

## 2021-03-03 LAB — GLUCOSE, CAPILLARY
Glucose-Capillary: 185 mg/dL — ABNORMAL HIGH (ref 70–99)
Glucose-Capillary: 181 mg/dL — ABNORMAL HIGH (ref 70–99)
Glucose-Capillary: 130 mg/dL — ABNORMAL HIGH (ref 70–99)
Glucose-Capillary: 190 mg/dL — ABNORMAL HIGH (ref 70–99)

## 2021-03-03 LAB — SARS CORONAVIRUS 2 BY RT PCR (HOSPITAL ORDER, PERFORMED IN ~~LOC~~ HOSPITAL LAB): SARS Coronavirus 2: NEGATIVE

## 2021-03-03 SURGERY — IRRIGATION AND DEBRIDEMENT EXTREMITY
Anesthesia: General | Laterality: Left

## 2021-03-03 MED ORDER — FENTANYL CITRATE (PF) 100 MCG/2ML IJ SOLN
25.0000 ug | INTRAMUSCULAR | Status: DC | PRN
  Administered 2021-03-03 (×3): 50 ug via INTRAVENOUS

## 2021-03-03 MED ORDER — LABETALOL HCL 5 MG/ML IV SOLN
INTRAVENOUS | Status: AC
  Filled 2021-03-03: qty 4

## 2021-03-03 MED ORDER — DOCUSATE SODIUM 100 MG PO CAPS
100.0000 mg | ORAL_CAPSULE | Freq: Every day | ORAL | Status: DC
  Administered 2021-03-04 – 2021-03-13 (×5): 100 mg via ORAL
  Filled 2021-03-03 (×8): qty 1

## 2021-03-03 MED ORDER — INSULIN ASPART 100 UNIT/ML IJ SOLN
0.0000 [IU] | Freq: Three times a day (TID) | INTRAMUSCULAR | Status: DC
  Administered 2021-03-04: 2 [IU] via SUBCUTANEOUS
  Administered 2021-03-04 (×2): 3 [IU] via SUBCUTANEOUS
  Administered 2021-03-05: 5 [IU] via SUBCUTANEOUS
  Administered 2021-03-05: 2 [IU] via SUBCUTANEOUS
  Administered 2021-03-05 – 2021-03-06 (×3): 3 [IU] via SUBCUTANEOUS
  Administered 2021-03-06: 2 [IU] via SUBCUTANEOUS
  Administered 2021-03-07: 3 [IU] via SUBCUTANEOUS
  Administered 2021-03-07 – 2021-03-08 (×3): 2 [IU] via SUBCUTANEOUS
  Administered 2021-03-08 – 2021-03-10 (×5): 3 [IU] via SUBCUTANEOUS
  Administered 2021-03-11: 2 [IU] via SUBCUTANEOUS
  Administered 2021-03-11: 3 [IU] via SUBCUTANEOUS
  Administered 2021-03-12: 2 [IU] via SUBCUTANEOUS
  Administered 2021-03-12: 3 [IU] via SUBCUTANEOUS
  Administered 2021-03-12 – 2021-03-13 (×3): 2 [IU] via SUBCUTANEOUS
  Administered 2021-03-13: 3 [IU] via SUBCUTANEOUS
  Administered 2021-03-14 – 2021-03-15 (×3): 2 [IU] via SUBCUTANEOUS
  Administered 2021-03-15: 3 [IU] via SUBCUTANEOUS

## 2021-03-03 MED ORDER — GUAIFENESIN-DM 100-10 MG/5ML PO SYRP
15.0000 mL | ORAL_SOLUTION | ORAL | Status: DC | PRN
  Filled 2021-03-03: qty 15

## 2021-03-03 MED ORDER — JUVEN PO PACK
1.0000 | PACK | Freq: Two times a day (BID) | ORAL | Status: DC
  Administered 2021-03-04 – 2021-03-08 (×5): 1 via ORAL
  Filled 2021-03-03 (×10): qty 1

## 2021-03-03 MED ORDER — INSULIN ASPART 100 UNIT/ML FLEXPEN: 2.0000 [IU] | PEN_INJECTOR | Freq: Three times a day (TID) | SUBCUTANEOUS | Status: DC

## 2021-03-03 MED ORDER — FENTANYL CITRATE (PF) 100 MCG/2ML IJ SOLN
INTRAMUSCULAR | Status: DC | PRN
  Administered 2021-03-03: 50 ug via INTRAVENOUS

## 2021-03-03 MED ORDER — PHENYLEPHRINE 40 MCG/ML (10ML) SYRINGE FOR IV PUSH (FOR BLOOD PRESSURE SUPPORT)
PREFILLED_SYRINGE | INTRAVENOUS | Status: AC
  Filled 2021-03-03: qty 10

## 2021-03-03 MED ORDER — OXYCODONE HCL 5 MG PO TABS
5.0000 mg | ORAL_TABLET | Freq: Once | ORAL | Status: AC | PRN
  Administered 2021-03-03: 5 mg via ORAL

## 2021-03-03 MED ORDER — MIDAZOLAM HCL 5 MG/5ML IJ SOLN
INTRAMUSCULAR | Status: DC | PRN
  Administered 2021-03-03: 2 mg via INTRAVENOUS

## 2021-03-03 MED ORDER — MIDAZOLAM HCL 2 MG/2ML IJ SOLN
INTRAMUSCULAR | Status: AC
  Filled 2021-03-03: qty 2

## 2021-03-03 MED ORDER — OXYCODONE HCL 5 MG/5ML PO SOLN: 5.0000 mg | Freq: Once | ORAL | Status: AC | PRN

## 2021-03-03 MED ORDER — METHOCARBAMOL 500 MG PO TABS
500.0000 mg | ORAL_TABLET | Freq: Four times a day (QID) | ORAL | Status: DC | PRN
  Administered 2021-03-03 – 2021-03-15 (×26): 500 mg via ORAL
  Filled 2021-03-03 (×26): qty 1

## 2021-03-03 MED ORDER — PROPOFOL 10 MG/ML IV BOLUS
INTRAVENOUS | Status: DC | PRN
  Administered 2021-03-03: 200 mg via INTRAVENOUS

## 2021-03-03 MED ORDER — MAGNESIUM CITRATE PO SOLN: 1.0000 | Freq: Once | ORAL | Status: DC | PRN

## 2021-03-03 MED ORDER — DULOXETINE HCL 30 MG PO CPEP
30.0000 mg | ORAL_CAPSULE | Freq: Every day | ORAL | Status: DC
  Administered 2021-03-04 – 2021-03-15 (×12): 30 mg via ORAL
  Filled 2021-03-03 (×12): qty 1

## 2021-03-03 MED ORDER — HYDROCHLOROTHIAZIDE 25 MG PO TABS
12.5000 mg | ORAL_TABLET | Freq: Every day | ORAL | Status: DC
  Administered 2021-03-03 – 2021-03-15 (×13): 12.5 mg via ORAL
  Filled 2021-03-03 (×13): qty 1

## 2021-03-03 MED ORDER — HYDROMORPHONE HCL 1 MG/ML IJ SOLN
0.5000 mg | INTRAMUSCULAR | Status: DC | PRN
  Administered 2021-03-03 – 2021-03-15 (×59): 1 mg via INTRAVENOUS
  Filled 2021-03-03 (×60): qty 1

## 2021-03-03 MED ORDER — CHLORHEXIDINE GLUCONATE 0.12 % MT SOLN: 15.0000 mL | Freq: Once | OROMUCOSAL | Status: AC

## 2021-03-03 MED ORDER — HYDROMORPHONE HCL 1 MG/ML IJ SOLN: 1.0000 mg | Freq: Once | INTRAMUSCULAR | Status: AC

## 2021-03-03 MED ORDER — PHENOL 1.4 % MT LIQD
1.0000 | OROMUCOSAL | Status: DC | PRN
Start: 2021-03-03 — End: 2021-03-15

## 2021-03-03 MED ORDER — INSULIN GLARGINE 100 UNIT/ML ~~LOC~~ SOLN
20.0000 [IU] | Freq: Every day | SUBCUTANEOUS | Status: DC
  Administered 2021-03-03 – 2021-03-14 (×12): 20 [IU] via SUBCUTANEOUS
  Filled 2021-03-03 (×13): qty 0.2

## 2021-03-03 MED ORDER — GENTAMICIN SULFATE 40 MG/ML IJ SOLN
INTRAMUSCULAR | Status: AC
  Filled 2021-03-03: qty 4

## 2021-03-03 MED ORDER — ONDANSETRON HCL 4 MG/2ML IJ SOLN
INTRAMUSCULAR | Status: DC | PRN
  Administered 2021-03-03: 4 mg via INTRAVENOUS

## 2021-03-03 MED ORDER — PANTOPRAZOLE SODIUM 40 MG PO TBEC
40.0000 mg | DELAYED_RELEASE_TABLET | Freq: Every day | ORAL | Status: DC
  Administered 2021-03-03 – 2021-03-15 (×12): 40 mg via ORAL
  Filled 2021-03-03 (×13): qty 1

## 2021-03-03 MED ORDER — CHLORHEXIDINE GLUCONATE 0.12 % MT SOLN
OROMUCOSAL | Status: AC
  Administered 2021-03-03: 15 mL via OROMUCOSAL
  Filled 2021-03-03: qty 15

## 2021-03-03 MED ORDER — ASCORBIC ACID 500 MG PO TABS
1000.0000 mg | ORAL_TABLET | Freq: Every day | ORAL | Status: DC
  Administered 2021-03-04 – 2021-03-15 (×12): 1000 mg via ORAL
  Filled 2021-03-03 (×13): qty 2

## 2021-03-03 MED ORDER — HYDROMORPHONE HCL 1 MG/ML IJ SOLN
INTRAMUSCULAR | Status: AC
  Administered 2021-03-03: 1 mg via INTRAVENOUS
  Filled 2021-03-03: qty 1

## 2021-03-03 MED ORDER — INSULIN GLARGINE 100 UNIT/ML SOLOSTAR PEN: 20.0000 [IU] | PEN_INJECTOR | Freq: Every day | SUBCUTANEOUS | Status: DC

## 2021-03-03 MED ORDER — GENTAMICIN SULFATE 40 MG/ML IJ SOLN
INTRAMUSCULAR | Status: DC | PRN
  Administered 2021-03-03 (×2): 80 mg

## 2021-03-03 MED ORDER — 0.9 % SODIUM CHLORIDE (POUR BTL) OPTIME
TOPICAL | Status: DC | PRN
  Administered 2021-03-03: 1000 mL

## 2021-03-03 MED ORDER — CEFAZOLIN SODIUM-DEXTROSE 2-4 GM/100ML-% IV SOLN
2.0000 g | INTRAVENOUS | Status: AC
  Administered 2021-03-03: 2 g via INTRAVENOUS
  Filled 2021-03-03: qty 100

## 2021-03-03 MED ORDER — SODIUM CHLORIDE 0.9 % IV SOLN: INTRAVENOUS | Status: DC

## 2021-03-03 MED ORDER — ONDANSETRON HCL 4 MG/2ML IJ SOLN
INTRAMUSCULAR | Status: AC
  Filled 2021-03-03: qty 2

## 2021-03-03 MED ORDER — FENTANYL CITRATE (PF) 250 MCG/5ML IJ SOLN
INTRAMUSCULAR | Status: AC
  Filled 2021-03-03: qty 5

## 2021-03-03 MED ORDER — PHENYLEPHRINE 40 MCG/ML (10ML) SYRINGE FOR IV PUSH (FOR BLOOD PRESSURE SUPPORT)
PREFILLED_SYRINGE | INTRAVENOUS | Status: DC | PRN
  Administered 2021-03-03 (×3): 120 ug via INTRAVENOUS
  Administered 2021-03-03: 80 ug via INTRAVENOUS
  Administered 2021-03-03: 120 ug via INTRAVENOUS
  Administered 2021-03-03 (×3): 80 ug via INTRAVENOUS

## 2021-03-03 MED ORDER — CALCIUM CARBONATE ANTACID 500 MG PO CHEW
1.0000 | CHEWABLE_TABLET | Freq: Three times a day (TID) | ORAL | Status: DC
  Administered 2021-03-03 – 2021-03-15 (×37): 200 mg via ORAL
  Filled 2021-03-03 (×35): qty 1

## 2021-03-03 MED ORDER — FENTANYL CITRATE (PF) 100 MCG/2ML IJ SOLN
INTRAMUSCULAR | Status: AC
  Filled 2021-03-03: qty 2

## 2021-03-03 MED ORDER — ALBUMIN HUMAN 5 % IV SOLN: INTRAVENOUS | Status: DC | PRN

## 2021-03-03 MED ORDER — ONDANSETRON HCL 4 MG/2ML IJ SOLN: 4.0000 mg | Freq: Once | INTRAMUSCULAR | Status: DC | PRN

## 2021-03-03 MED ORDER — HYDROCHLOROTHIAZIDE 25 MG PO TABS: 12.5000 mg | ORAL_TABLET | Freq: Every day | ORAL | Status: DC

## 2021-03-03 MED ORDER — LACTATED RINGERS IV SOLN: INTRAVENOUS | Status: DC

## 2021-03-03 MED ORDER — ALUM & MAG HYDROXIDE-SIMETH 200-200-20 MG/5ML PO SUSP
15.0000 mL | ORAL | Status: DC | PRN
  Filled 2021-03-03: qty 30

## 2021-03-03 MED ORDER — VANCOMYCIN HCL 1250 MG/250ML IV SOLN
1250.0000 mg | Freq: Two times a day (BID) | INTRAVENOUS | Status: DC
  Administered 2021-03-03 – 2021-03-08 (×10): 1250 mg via INTRAVENOUS
  Filled 2021-03-03 (×11): qty 250

## 2021-03-03 MED ORDER — BISACODYL 5 MG PO TBEC: 5.0000 mg | DELAYED_RELEASE_TABLET | Freq: Every day | ORAL | Status: DC | PRN

## 2021-03-03 MED ORDER — ONDANSETRON HCL 4 MG/2ML IJ SOLN: 4.0000 mg | Freq: Four times a day (QID) | INTRAMUSCULAR | Status: DC | PRN

## 2021-03-03 MED ORDER — OXYCODONE HCL 5 MG PO TABS
ORAL_TABLET | ORAL | Status: AC
  Filled 2021-03-03: qty 1

## 2021-03-03 MED ORDER — PROPOFOL 10 MG/ML IV BOLUS
INTRAVENOUS | Status: AC
  Filled 2021-03-03: qty 20

## 2021-03-03 MED ORDER — BUPIVACAINE HCL (PF) 0.25 % IJ SOLN
INTRAMUSCULAR | Status: AC
  Filled 2021-03-03: qty 30

## 2021-03-03 MED ORDER — EPHEDRINE SULFATE-NACL 50-0.9 MG/10ML-% IV SOSY
PREFILLED_SYRINGE | INTRAVENOUS | Status: DC | PRN
  Administered 2021-03-03 (×2): 5 mg via INTRAVENOUS

## 2021-03-03 MED ORDER — BUPIVACAINE HCL 0.25 % IJ SOLN
INTRAMUSCULAR | Status: DC | PRN
  Administered 2021-03-03: 20 mL

## 2021-03-03 MED ORDER — PIPERACILLIN-TAZOBACTAM 3.375 G IVPB
3.3750 g | Freq: Three times a day (TID) | INTRAVENOUS | Status: DC
  Administered 2021-03-03 – 2021-03-05 (×5): 3.375 g via INTRAVENOUS
  Filled 2021-03-03 (×5): qty 50

## 2021-03-03 MED ORDER — LIDOCAINE 2% (20 MG/ML) 5 ML SYRINGE
INTRAMUSCULAR | Status: AC
  Filled 2021-03-03: qty 5

## 2021-03-03 MED ORDER — HYDROMORPHONE HCL 1 MG/ML IJ SOLN
0.2500 mg | INTRAMUSCULAR | Status: DC | PRN
Start: 2021-03-03 — End: 2021-03-03
  Administered 2021-03-03 (×2): 0.5 mg via INTRAVENOUS

## 2021-03-03 MED ORDER — ORAL CARE MOUTH RINSE: 15.0000 mL | Freq: Once | OROMUCOSAL | Status: AC

## 2021-03-03 MED ORDER — LABETALOL HCL 5 MG/ML IV SOLN
5.0000 mg | INTRAVENOUS | Status: DC | PRN
  Administered 2021-03-03: 5 mg via INTRAVENOUS

## 2021-03-03 MED ORDER — ACETAMINOPHEN 325 MG PO TABS
325.0000 mg | ORAL_TABLET | Freq: Four times a day (QID) | ORAL | Status: DC | PRN
  Administered 2021-03-10 – 2021-03-13 (×2): 650 mg via ORAL
  Filled 2021-03-03 (×2): qty 2

## 2021-03-03 MED ORDER — EPHEDRINE 5 MG/ML INJ
INTRAVENOUS | Status: AC
  Filled 2021-03-03: qty 30

## 2021-03-03 MED ORDER — PHENYLEPHRINE 40 MCG/ML (10ML) SYRINGE FOR IV PUSH (FOR BLOOD PRESSURE SUPPORT)
PREFILLED_SYRINGE | INTRAVENOUS | Status: AC
  Filled 2021-03-03: qty 30

## 2021-03-03 MED ORDER — GABAPENTIN 300 MG PO CAPS
600.0000 mg | ORAL_CAPSULE | Freq: Three times a day (TID) | ORAL | Status: DC
  Administered 2021-03-03 – 2021-03-15 (×37): 600 mg via ORAL
  Filled 2021-03-03 (×38): qty 2

## 2021-03-03 MED ORDER — POLYETHYLENE GLYCOL 3350 17 G PO PACK: 17.0000 g | PACK | Freq: Every day | ORAL | Status: DC | PRN

## 2021-03-03 MED ORDER — OXYCODONE HCL 5 MG PO TABS
10.0000 mg | ORAL_TABLET | ORAL | Status: DC | PRN
Start: 2021-03-03 — End: 2021-03-15
  Administered 2021-03-04 – 2021-03-11 (×21): 15 mg via ORAL
  Administered 2021-03-12 – 2021-03-13 (×5): 10 mg via ORAL
  Administered 2021-03-14 – 2021-03-15 (×3): 15 mg via ORAL
  Filled 2021-03-03 (×2): qty 3
  Filled 2021-03-03: qty 2
  Filled 2021-03-03 (×6): qty 3
  Filled 2021-03-03 (×2): qty 2
  Filled 2021-03-03 (×9): qty 3
  Filled 2021-03-03: qty 2
  Filled 2021-03-03 (×5): qty 3
  Filled 2021-03-03: qty 2
  Filled 2021-03-03 (×3): qty 3

## 2021-03-03 MED ORDER — INSULIN ASPART 100 UNIT/ML IJ SOLN
2.0000 [IU] | Freq: Three times a day (TID) | INTRAMUSCULAR | Status: DC
  Administered 2021-03-04 – 2021-03-15 (×33): 2 [IU] via SUBCUTANEOUS

## 2021-03-03 MED ORDER — LIDOCAINE 2% (20 MG/ML) 5 ML SYRINGE
INTRAMUSCULAR | Status: DC | PRN
  Administered 2021-03-03: 60 mg via INTRAVENOUS

## 2021-03-03 MED ORDER — METHOCARBAMOL 500 MG PO TABS
ORAL_TABLET | ORAL | Status: AC
  Filled 2021-03-03: qty 1

## 2021-03-03 MED ORDER — VANCOMYCIN HCL 500 MG IV SOLR
INTRAVENOUS | Status: AC
  Filled 2021-03-03: qty 500

## 2021-03-03 MED ORDER — OXYCODONE HCL 5 MG PO TABS
5.0000 mg | ORAL_TABLET | ORAL | Status: DC | PRN
  Administered 2021-03-03 – 2021-03-14 (×11): 10 mg via ORAL
  Administered 2021-03-15: 5 mg via ORAL
  Filled 2021-03-03 (×12): qty 2

## 2021-03-03 MED ORDER — INSULIN ASPART 100 UNIT/ML IJ SOLN
INTRAMUSCULAR | Status: AC
  Administered 2021-03-03: 2 [IU]
  Filled 2021-03-03: qty 1

## 2021-03-03 MED ORDER — VANCOMYCIN HCL 500 MG IV SOLR
INTRAVENOUS | Status: DC | PRN
  Administered 2021-03-03: 500 mg via TOPICAL

## 2021-03-03 MED ORDER — ZINC SULFATE 220 (50 ZN) MG PO CAPS
220.0000 mg | ORAL_CAPSULE | Freq: Every day | ORAL | Status: DC
  Administered 2021-03-03 – 2021-03-15 (×13): 220 mg via ORAL
  Filled 2021-03-03 (×13): qty 1

## 2021-03-03 MED ORDER — HYDROMORPHONE HCL 1 MG/ML IJ SOLN
INTRAMUSCULAR | Status: AC
  Filled 2021-03-03: qty 1

## 2021-03-03 SURGICAL SUPPLY — 33 items
BLADE SURG 21 STRL SS (BLADE) ×2 IMPLANT
BNDG COHESIVE 6X5 TAN NS LF (GAUZE/BANDAGES/DRESSINGS) ×2 IMPLANT
BNDG COHESIVE 6X5 TAN STRL LF (GAUZE/BANDAGES/DRESSINGS) IMPLANT
BNDG GAUZE ELAST 4 BULKY (GAUZE/BANDAGES/DRESSINGS) ×4 IMPLANT
COVER SURGICAL LIGHT HANDLE (MISCELLANEOUS) ×4 IMPLANT
COVER WAND RF STERILE (DRAPES) IMPLANT
DRAPE U-SHAPE 47X51 STRL (DRAPES) ×2 IMPLANT
DRSG ADAPTIC 3X8 NADH LF (GAUZE/BANDAGES/DRESSINGS) ×2 IMPLANT
DURAPREP 26ML APPLICATOR (WOUND CARE) ×2 IMPLANT
ELECT REM PT RETURN 9FT ADLT (ELECTROSURGICAL)
ELECTRODE REM PT RTRN 9FT ADLT (ELECTROSURGICAL) IMPLANT
GAUZE SPONGE 4X4 12PLY STRL (GAUZE/BANDAGES/DRESSINGS) ×2 IMPLANT
GLOVE BIOGEL PI IND STRL 9 (GLOVE) ×1 IMPLANT
GLOVE BIOGEL PI INDICATOR 9 (GLOVE) ×1
GLOVE SURG ORTHO 9.0 STRL STRW (GLOVE) ×2 IMPLANT
GOWN STRL REUS W/ TWL XL LVL3 (GOWN DISPOSABLE) ×2 IMPLANT
GOWN STRL REUS W/TWL XL LVL3 (GOWN DISPOSABLE) ×4
HANDPIECE INTERPULSE COAX TIP (DISPOSABLE)
KIT BASIN OR (CUSTOM PROCEDURE TRAY) ×2 IMPLANT
KIT STIMULAN RAPID CURE 5CC (Orthopedic Implant) ×2 IMPLANT
KIT TURNOVER KIT B (KITS) ×2 IMPLANT
MANIFOLD NEPTUNE II (INSTRUMENTS) ×2 IMPLANT
NS IRRIG 1000ML POUR BTL (IV SOLUTION) ×2 IMPLANT
PACK ORTHO EXTREMITY (CUSTOM PROCEDURE TRAY) ×2 IMPLANT
PAD ARMBOARD 7.5X6 YLW CONV (MISCELLANEOUS) ×4 IMPLANT
SET HNDPC FAN SPRY TIP SCT (DISPOSABLE) IMPLANT
STOCKINETTE IMPERVIOUS 9X36 MD (GAUZE/BANDAGES/DRESSINGS) IMPLANT
SUT ETHILON 2 0 PSLX (SUTURE) ×2 IMPLANT
SWAB COLLECTION DEVICE MRSA (MISCELLANEOUS) ×2 IMPLANT
SWAB CULTURE ESWAB REG 1ML (MISCELLANEOUS) IMPLANT
TOWEL GREEN STERILE (TOWEL DISPOSABLE) ×2 IMPLANT
TUBE CONNECTING 12X1/4 (SUCTIONS) ×2 IMPLANT
YANKAUER SUCT BULB TIP NO VENT (SUCTIONS) ×2 IMPLANT

## 2021-03-03 NOTE — Anesthesia Preprocedure Evaluation (Signed)
Anesthesia Evaluation  Patient identified by MRN, date of birth, ID band Patient awake    Reviewed: Allergy & Precautions, NPO status , Patient's Chart, lab work & pertinent test results, reviewed documented beta blocker date and time   Airway Mallampati: II  TM Distance: >3 FB Neck ROM: Full    Dental  (+) Dental Advisory Given, Poor Dentition, Loose, Missing, Chipped   Pulmonary neg pulmonary ROS,    Pulmonary exam normal breath sounds clear to auscultation       Cardiovascular hypertension, Normal cardiovascular exam Rhythm:Regular Rate:Normal     Neuro/Psych Peripheral neuropathy  Neuromuscular disease negative psych ROS   GI/Hepatic negative GI ROS, Neg liver ROS,   Endo/Other  diabetes, Well Controlled, Type 2, Insulin Dependent  Renal/GU   negative genitourinary   Musculoskeletal Abscess left foot S/P Ray amputation   Abdominal   Peds  Hematology  (+) anemia ,   Anesthesia Other Findings   Reproductive/Obstetrics                             Anesthesia Physical Anesthesia Plan  ASA: III  Anesthesia Plan: General   Post-op Pain Management:    Induction: Intravenous  PONV Risk Score and Plan: 3 and Treatment may vary due to age or medical condition and Ondansetron  Airway Management Planned: LMA  Additional Equipment:   Intra-op Plan:   Post-operative Plan: Extubation in OR  Informed Consent: I have reviewed the patients History and Physical, chart, labs and discussed the procedure including the risks, benefits and alternatives for the proposed anesthesia with the patient or authorized representative who has indicated his/her understanding and acceptance.     Dental advisory given  Plan Discussed with: CRNA and Anesthesiologist  Anesthesia Plan Comments:         Anesthesia Quick Evaluation

## 2021-03-03 NOTE — Interval H&P Note (Signed)
History and Physical Interval Note:  03/03/2021 12:14 PM  Brett White  has presented today for surgery, with the diagnosis of abscess left foot.  The various methods of treatment have been discussed with the patient and family. After consideration of risks, benefits and other options for treatment, the patient has consented to  Procedure(s): LEFT FOOT DEBRIDEMENT (Left) as a surgical intervention.  The patient's history has been reviewed, patient examined, no change in status, stable for surgery.  I have reviewed the patient's chart and labs.  Questions were answered to the patient's satisfaction.     [name]

## 2021-03-03 NOTE — Transfer of Care (Signed)
Immediate Anesthesia Transfer of Care Note  Patient: Brett White  Procedure(s) Performed: LEFT FOOT DEBRIDEMENT (Left )  Patient Location: PACU  Anesthesia Type:General  Level of Consciousness: awake and patient cooperative  Airway & Oxygen Therapy: Patient Spontanous Breathing and Patient connected to face mask oxygen  Post-op Assessment: Report given to RN and Post -op Vital signs reviewed and stable  Post vital signs: Reviewed and stable  Last Vitals:  Vitals Value Taken Time  BP 131/81 03/03/21 1400  Temp    Pulse 88 03/03/21 1402  Resp 11 03/03/21 1402  SpO2 99 % 03/03/21 1402  Vitals shown include unvalidated device data.  Last Pain:  Vitals:   03/03/21 1300  TempSrc:   PainSc: 8       Patients Stated Pain Goal: 8 (03/03/21 1240)  Complications: No complications documented.

## 2021-03-03 NOTE — Anesthesia Procedure Notes (Addendum)
Procedure Name: LMA Insertion Date/Time: 03/03/2021 1:20 PM Performed by: Marny Lowenstein, CRNA Pre-anesthesia Checklist: Patient identified, Emergency Drugs available, Suction available and Patient being monitored Patient Re-evaluated:Patient Re-evaluated prior to induction Oxygen Delivery Method: Circle system utilized Preoxygenation: Pre-oxygenation with 100% oxygen Induction Type: IV induction Ventilation: Mask ventilation without difficulty LMA: LMA inserted LMA Size: 5.0 Number of attempts: 1 Placement Confirmation: positive ETCO2 and breath sounds checked- equal and bilateral Tube secured with: Tape Dental Injury: Teeth and Oropharynx as per pre-operative assessment

## 2021-03-03 NOTE — Op Note (Addendum)
03/03/2021  2:21 PM  PATIENT:  Brett White    PRE-OPERATIVE DIAGNOSIS:  abscess left foot  POST-OPERATIVE DIAGNOSIS:  Same  PROCEDURE:  LEFT FOOT DEBRIDEMENT with excision of skin soft tissue muscle fascia and bone. Local tissue rearrangement for wound closure 10 x 4 cm. Application of 13 cm Prevena wound VAC.  SURGEON:  [name], MD  PHYSICIAN ASSISTANT:None ANESTHESIA:   General  PREOPERATIVE INDICATIONS:  Brett White is a  59 y.o. male with a diagnosis of abscess left foot who failed conservative measures and elected for surgical management.    The risks benefits and alternatives were discussed with the patient preoperatively including but not limited to the risks of infection, bleeding, nerve injury, cardiopulmonary complications, the need for revision surgery, among others, and the patient was willing to proceed.  OPERATIVE IMPLANTS: 5 cc of stimulant beads with 500 mg vancomycin and 160 mg gentamicin  @ENCIMAGES @  OPERATIVE FINDINGS: Nonviable tissue but no purulent abscess.  Tissue was sent for cultures.  OPERATIVE PROCEDURE: Patient was brought the operating room and underwent a general anesthetic.  After adequate levels anesthesia were obtained patient's left lower extremity was prepped using DuraPrep draped into a sterile field a timeout was called.  Patient underwent local infiltration with 10 cc of quarter percent Marcaine plain.  Elliptical incision was made around the previous surgical incision this left a wound that was 10 x 4 cm.  A 21 blade knife was used to excise skin and soft tissue muscle fascia and a rondure was used to excise bone.  Electrocautery was used for hemostasis the wound was irrigated normal saline.  The stimulant antibiotic beads were placed total of 5 cc with 500 mg of vancomycin 160 mg of gentamicin.  Local tissue rearrangement was used to close the wound 10 x 4 cm.  A Prevena wound VAC 13 cm was applied this had a good suction fit overwrapped  with Coban patient was extubated taken to PACU stable condition   Debridement type: Excisional Debridement  Side: left  Body Location: foot   Tools used for debridement: scalpel and rongeur  Pre-debridement Wound size (cm):   Length: 9        Width: 1     Depth: 1   Post-debridement Wound size (cm):   Length: 10        Width: 4     Depth: 3   Debridement depth beyond dead/damaged tissue down to healthy viable tissue: yes  Tissue layer involved: skin, subcutaneous tissue, muscle / fascia, bone  Nature of tissue removed: Slough, Necrotic, Devitalized Tissue and Non-viable tissue  Irrigation volume: 1 liter      Irrigation fluid type: Normal Saline        DISCHARGE PLANNING:  Antibiotic duration: Start antibiotics with vancomycin and Zosyn  Weightbearing: Touchdown weightbearing on the left  Pain medication: Opioid pathway  Dressing care/ Wound VAC: Continue wound VAC  Ambulatory devices: Walker  Discharge to: Will admit for weight tissue culture sensitivities and anticipate discharge back to skilled nursing on Monday  Follow-up: In the office 1 week post operative.

## 2021-03-03 NOTE — Anesthesia Postprocedure Evaluation (Signed)
Anesthesia Post Note  Patient: Brett White  Procedure(s) Performed: LEFT FOOT DEBRIDEMENT (Left )     Patient location during evaluation: PACU Anesthesia Type: General Level of consciousness: awake and alert and oriented Pain management: pain level controlled Vital Signs Assessment: post-procedure vital signs reviewed and stable Respiratory status: spontaneous breathing, nonlabored ventilation and respiratory function stable Cardiovascular status: blood pressure returned to baseline and stable Postop Assessment: no apparent nausea or vomiting Anesthetic complications: no   No complications documented.  Last Vitals:  Vitals:   03/03/21 1430 03/03/21 1445  BP: (!) 144/93 (!) 147/90  Pulse: 88 85  Resp: 15 15  Temp:    SpO2: 96% 95%    Last Pain:  Vitals:   03/03/21 1445  TempSrc:   PainSc: Asleep                 Marialuisa Basara A.

## 2021-03-03 NOTE — Progress Notes (Addendum)
Pharmacy Antibiotic Note  Brett White is a 60 y.o. male admitted on 03/03/2021 with L foot abscess s/p debridement 6/3. Pharmacy has been consulted for vancomycin and Zosyn dosing. BMET ordered for tomorrow, last Cr was PTA 5/24 but has historically been stable ~1 so will not order labs for tonight.  Plan: Zosyn 3.375g IV EI q8h Vancomycin 1250mg  IV q12h - est AUC 459 Follow Cr tomorrow   Height: 6\' 3"  (190.5 cm) Weight: 97.1 kg (214 lb 1.1 oz) IBW/kg (Calculated) : 84.5  Temp (24hrs), Avg:97.7 F (36.5 C), Min:97.1 F (36.2 C), Max:98.1 F (36.7 C)  No results for input(s): WBC, CREATININE, LATICACIDVEN, VANCOTROUGH, VANCOPEAK, VANCORANDOM, GENTTROUGH, GENTPEAK, GENTRANDOM, TOBRATROUGH, TOBRAPEAK, TOBRARND, AMIKACINPEAK, AMIKACINTROU, AMIKACIN in the last 168 hours.  Estimated Creatinine Clearance: 89.4 mL/min (by C-G formula based on SCr of 1.09 mg/dL).    No Known Allergies  Antimicrobials this admission: Zosyn 6/3 >>  Vancomycin 6/3 >>   Thank you for allowing pharmacy to be a part of this patient's care.  8/3, PharmD, BCPS, [facility] Clinical Pharmacist [phone] Please check AMION for all [facility] numbers 03/03/2021

## 2021-03-03 NOTE — H&P (Signed)
Brett White is an 60 y.o. male.   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  Patient went to the emergency room on 524 with concerns for possible infection he complains of increased pain and swelling.  He is currently on doxycycline he is on oxycodone on Cymbalta and Neurontin.  He complains of increased swelling and pain.  Past Medical History:  Diagnosis Date  . Diabetes mellitus without complication [facility])     Past Surgical History:  Procedure Laterality Date  . AMPUTATION Left 02/04/2021   Procedure: LEFT GREAT TOE AMPUTATION;  Surgeon: Brett Mustard, MD;  Location: [facility] OR;  Service: Orthopedics;  Laterality: Left;  . AMPUTATION Left 02/10/2021   Procedure: LEFT FOOT 1ST RAY AMPUTATION;  Surgeon: Brett Mustard, MD;  Location: [facility] OR;  Service: Orthopedics;  Laterality: Left;  . APPENDECTOMY    . BACK SURGERY    . TONSILLECTOMY      Family History  Problem Relation Age of Onset  . Heart attack Neg Hx    Social History:  reports that he has never smoked. He has never used smokeless tobacco. He reports previous alcohol use. He reports previous drug use.  Allergies: No Known Allergies  No medications prior to admission.    No results found for this or any previous visit (from the past 48 hour(s)). XR Foot 2 Views Left  Result Date: 03/02/2021 2 view radiographs of the left foot shows the first ray amputation with some irregularity at the distal aspect of the medial cuneiform.   Review of Systems  All other systems reviewed and are negative.   There were no vitals taken for this visit. Physical Exam  Patient is alert, oriented, no adenopathy, well-dressed, normal affect, normal respiratory effort. Examination patient has a good dorsalis pedis pulse he has significant swelling in the foot and leg he can move the ankle he has no evidence of DVT clinically.  There is redness around the  incision with good granulation tissue no wound ischemia no drainage no purulence.  With elevation and skin massage the redness resolves.  Clinically he does not have symptoms of infection but with the pain out of proportion to the clinical findings I am concerned for a deep abscess.Heart RRR Lungs clear Assessment/Plan  1. Osteomyelitis of great toe of left foot (HCC)   2. Cellulitis of left foot     Plan: Patient's pain is out of proportion to his clinical findings and concern for infection.  We will plan for surgical debridement tomorrow of the left foot.  Plan for cultures placement of a wound VAC and IV antibiotics for 3 days anticipate return back to skilled nursing next Monday.  Brett Bali Brett Cortese, PA 03/03/2021, 6:36 AM

## 2021-03-04 LAB — BASIC METABOLIC PANEL
Anion gap: 8 (ref 5–15)
BUN: 21 mg/dL — ABNORMAL HIGH (ref 6–20)
CO2: 29 mmol/L (ref 22–32)
Calcium: 8.7 mg/dL — ABNORMAL LOW (ref 8.9–10.3)
Chloride: 97 mmol/L — ABNORMAL LOW (ref 98–111)
Creatinine, Ser: 1.07 mg/dL (ref 0.61–1.24)
GFR, Estimated: >60 mL/min (ref >60)
Glucose, Bld: 222 mg/dL — ABNORMAL HIGH (ref 70–99)
Potassium: 4.5 mmol/L (ref 3.5–5.1)
Sodium: 134 mmol/L — ABNORMAL LOW (ref 135–145)

## 2021-03-04 LAB — CBC
HCT: 35.1 % — ABNORMAL LOW (ref 39.0–52.0)
Hemoglobin: 11.2 g/dL — ABNORMAL LOW (ref 13.0–17.0)
MCH: 28.2 pg (ref 26.0–34.0)
MCHC: 31.9 g/dL (ref 30.0–36.0)
MCV: 88.4 fL (ref 80.0–100.0)
Platelets: 368 10*3/uL (ref 150–400)
RBC: 3.97 MIL/uL — ABNORMAL LOW (ref 4.22–5.81)
RDW: 12.7 % (ref 11.5–15.5)
WBC: 8.4 10*3/uL (ref 4.0–10.5)
nRBC: 0 % (ref 0.0–0.2)

## 2021-03-04 LAB — GLUCOSE, CAPILLARY
Glucose-Capillary: 173 mg/dL — ABNORMAL HIGH (ref 70–99)
Glucose-Capillary: 155 mg/dL — ABNORMAL HIGH (ref 70–99)
Glucose-Capillary: 141 mg/dL — ABNORMAL HIGH (ref 70–99)
Glucose-Capillary: 184 mg/dL — ABNORMAL HIGH (ref 70–99)

## 2021-03-04 MED ORDER — KETOROLAC TROMETHAMINE 15 MG/ML IJ SOLN
15.0000 mg | Freq: Four times a day (QID) | INTRAMUSCULAR | Status: AC
  Administered 2021-03-04 – 2021-03-05 (×5): 15 mg via INTRAVENOUS
  Filled 2021-03-04 (×5): qty 1

## 2021-03-04 NOTE — Evaluation (Signed)
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 He was admitted for pain and swelling in operative foot. He is now s/p L foot debridement with wound vac. PMH includes:  DM II with neuropathy and prior back surgery.   Clinical Impression   Pt from SNF after amputation about 3 weeks ago. Prior to initial amputation was independent in ADL and mobility. At SNF he was working with therapy and he reports supervision with RW and able to complete ADL with set up. Today he required multimodal cues for new NWB status with L foot. Min guard assist for transfers and balance and assist with safety with RW. Pt is able to reach BLE from seated position and perform most ADL from seated position - currently he is unable to maintain standing balance and maintain NWB. Pt will benefit from skilled OT in the acute setting and return to SNF. Next session focus on safe transfers and energy conservation.     Follow Up Recommendations  SNF    Equipment Recommendations  Tub/shower bench;3 in 1 bedside commode    Recommendations for Other Services       Precautions / Restrictions Precautions Precautions: Fall Precaution Comments: pt thinks he can use heel to WB on LLE Required Braces or Orthoses: Other Brace (Post Op Shoe) Other Brace: post op shoe, refuses to wear Restrictions Weight Bearing Restrictions: Yes LLE Weight Bearing: Non weight bearing      Mobility Bed Mobility Overal bed mobility: Needs Assistance Bed Mobility: Sit to Supine       Sit to supine: Supervision   General bed mobility comments: In recliner at beginning and end of session    Transfers Overall transfer level: Needs assistance Equipment used: Rolling walker (2 wheeled) Transfers: Sit to/from Stand Sit to Stand:  Min assist         General transfer comment: multimodal cues for NWB, vc for safe hand placement, fatigues quickly    Balance Overall balance assessment: Needs assistance Sitting-balance support: Feet supported Sitting balance-Leahy Scale: Normal     Standing balance support: Bilateral upper extremity supported;During functional activity Standing balance-Leahy Scale: Poor Standing balance comment: BUE essential for NWB and balance                           ADL either performed or assessed with clinical judgement   ADL Overall ADL's : Needs assistance/impaired Eating/Feeding: Set up;Sitting   Grooming: Wash/dry hands;Wash/dry face;Oral care;Set up;Sitting Grooming Details (indicate cue type and reason): in recliner Upper Body Bathing: Set up;Sitting   Lower Body Bathing: Minimal assistance;Cueing for safety;Cueing for sequencing;Sitting/lateral leans Lower Body Bathing Details (indicate cue type and reason): able to access feet for cleaning Upper Body Dressing : Set up;Sitting   Lower Body Dressing: Sit to/from stand;Moderate assistance   Toilet Transfer: Cueing for safety;Cueing for sequencing;RW;Minimal assistance Toilet Transfer Details (indicate cue type and reason): multimodal cues for WB status, assist for boost, fatigues quickly Toileting- Clothing Manipulation and Hygiene: Cueing for safety;Cueing for sequencing;Sit to/from stand;Minimal assistance       Functional mobility during ADLs: Moderate assistance;Cueing for safety;Cueing for sequencing;Rolling walker General ADL Comments: decreased safety awareness, poor recall of precautions (was TDWB last time, now NWB) line management with wound vac. much safer in sitting position  Vision Patient Visual Report: No change from baseline       Perception     Praxis      Pertinent Vitals/Pain Pain Assessment: Faces Faces Pain Scale: Hurts little more Pain Location: foot Pain Descriptors /  Indicators: Constant;Discomfort;Grimacing Pain Intervention(s): Monitored during session;Repositioned     Hand Dominance Right   Extremity/Trunk Assessment Upper Extremity Assessment Upper Extremity Assessment: Overall WFL for tasks assessed   Lower Extremity Assessment Lower Extremity Assessment: Defer to PT evaluation RLE Deficits / Details: general weakness RLE Sensation: history of peripheral neuropathy RLE Coordination: decreased gross motor LLE Deficits / Details: recent surgery on R foot at great toe site LLE: Unable to fully assess due to immobilization LLE Sensation: history of peripheral neuropathy LLE Coordination: decreased gross motor   Cervical / Trunk Assessment Cervical / Trunk Assessment: Normal   Communication Communication Communication: No difficulties   Cognition Arousal/Alertness: Awake/alert Behavior During Therapy: WFL for tasks assessed/performed Overall Cognitive Status: Within Functional Limits for tasks assessed Area of Impairment: Problem solving;Awareness;Safety/judgement;Following commands;Memory                 Orientation Level: Situation Current Attention Level: Selective Memory: Decreased short-term memory;Decreased recall of precautions Following Commands: Follows one step commands inconsistently;Follows one step commands with increased time Safety/Judgement: Decreased awareness of deficits;Decreased awareness of safety Awareness: Intellectual Problem Solving: Requires verbal cues;Difficulty sequencing General Comments: multimodal cues for NWB at this time   General Comments  pt is up on walker in his room and not safely able to maneuver with WB limits from MD consistently.  Talked with pt at length about the risks and he is not able to maintain WB limits reliably.    Exercises     Shoulder Instructions      Home Living Family/patient expects to be discharged to:: Skilled nursing facility Living Arrangements: Alone Available  Help at Discharge: Friend(s);Available PRN/intermittently Type of Home: Apartment Home Access: Level entry     Home Layout: One level     Bathroom Shower/Tub: Tub/shower unit;Curtain   Firefighter: Standard Bathroom Accessibility: Yes   Home Equipment: Shower seat   Additional Comments: pt is unaware of what things from home are with him      Prior Functioning/Environment Level of Independence: Independent (from prior to initial amputation)        Comments: falls at home prior to initial amputation        OT Problem List: Decreased strength;Decreased activity tolerance;Decreased safety awareness;Decreased knowledge of use of DME or AE;Pain      OT Treatment/Interventions: Self-care/ADL training;Therapeutic exercise;Energy conservation;DME and/or AE instruction;Therapeutic activities;Patient/family education;Balance training    OT Goals(Current goals can be found in the care plan section) Acute Rehab OT Goals Patient Stated Goal: go to rehab and then home OT Goal Formulation: With patient Time For Goal Achievement: 03/18/21 Potential to Achieve Goals: Good ADL Goals Pt Will Perform Grooming: with set-up;sitting Pt Will Perform Lower Body Bathing: with set-up;sitting/lateral leans Pt Will Perform Lower Body Dressing: with supervision;sit to/from stand Pt Will Transfer to Toilet: with supervision;ambulating Pt Will Perform Toileting - Clothing Manipulation and hygiene: with modified independence;sitting/lateral leans  OT Frequency: Min 2X/week   Barriers to D/C:            Co-evaluation              AM-PAC OT "6 Clicks" Daily Activity     Outcome Measure Help from another person eating meals?: None Help from another person taking care of  personal grooming?: A Little Help from another person toileting, which includes using toliet, bedpan, or urinal?: A Little Help from another person bathing (including washing, rinsing, drying)?: A Little Help from  another person to put on and taking off regular upper body clothing?: A Little Help from another person to put on and taking off regular lower body clothing?: A Lot 6 Click Score: 18   End of Session Equipment Utilized During Treatment: Gait belt;Rolling walker Nurse Communication: Mobility status;Weight bearing status  Activity Tolerance: Patient tolerated treatment well Patient left: in chair;with call bell/phone within reach;with chair alarm set  OT Visit Diagnosis: Unsteadiness on feet (R26.81);Other abnormalities of gait and mobility (R26.89);Pain Pain - Right/Left: Left Pain - part of body: Ankle and joints of foot                Time: 3810-1751 OT Time Calculation (min): 28 min Charges:  OT General Charges $OT Visit: 1 Visit OT Evaluation $OT Eval Moderate Complexity: 1 Mod OT Treatments $Self Care/Home Management : 8-22 mins  Nyoka Cowden OTR/L Acute Rehabilitation Services Pager: [phone] Office: [phone]  Brett White Brett White 03/04/2021, 5:42 PM

## 2021-03-04 NOTE — Progress Notes (Addendum)
Patient ID: Brett White, male   DOB: 10-05-1960, 60 y.o.   MRN: [id number] Patient is postoperative day 1 debridement abscess left foot status post first ray amputation.  Cultures are pending.  Patient states he still has a throbbing pain in his foot and will order Toradol and Neurontin to help.  Again discussed the importance of elevation.  Anticipate discharge on Monday.  Patient does have antibiotic beads in place with 500 mg vancomycin and 160 mg gentamicin.  50 cc in the wound VAC canister.

## 2021-03-04 NOTE — Plan of Care (Signed)
  Problem: Education: Goal: Knowledge of General Education information will improve Description: Including pain rating scale, medication(s)/side effects and non-pharmacologic comfort measures Outcome: Progressing   Problem: Clinical Measurements: Goal: Ability to maintain clinical measurements within normal limits will improve Outcome: Progressing Goal: Will remain free from infection Outcome: Progressing   Problem: Activity: Goal: Risk for activity intolerance will decrease Outcome: Progressing   Problem: Nutrition: Goal: Adequate nutrition will be maintained Outcome: Progressing   Problem: Elimination: Goal: Will not experience complications related to urinary retention Outcome: Progressing   Problem: Pain Managment: Goal: General experience of comfort will improve Outcome: Progressing   Problem: Safety: Goal: Ability to remain free from injury will improve Outcome: Progressing   Problem: Skin Integrity: Goal: Risk for impaired skin integrity will decrease Outcome: Progressing   

## 2021-03-04 NOTE — Evaluation (Signed)
Physical Therapy Evaluation Patient Details Name: Brett White MRN: [id number] DOB: 08-Feb-1961 Today's Date: 03/04/2021   History of Present Illness  60 yo male with onset of L foot injury from great toe being pierced with thumbtack was given amp great toe, then revised to first ray and now I & D for abscess on 6/3.  Has residual burning pain, lactic acidosis, concerns for osteomyelitis  before sx.  Has been taken off wound vac prior to abscess.  PMHx:  CVA 2015, PN, DM, HTN, non compliance medically  Clinical Impression  Pt was seen for mobility on RW with help to manage his struggle with WB on LLE.  Pt is tending to pressure L heel and cannot reliably walk to go home alone.  Has a plan to go to rehab for completion of gait and transfer training and prevent further decline on L foot with nursing services for wounds.  Follow along with him and encourage pt to bring his shoe for L foot protection as well as to consider knee walker or other devices.  Has shoulder issues that make crutches a difficult choice for WB management.  Follow along for goals of acute PT.    Follow Up Recommendations SNF    Equipment Recommendations  None recommended by PT    Recommendations for Other Services       Precautions / Restrictions Precautions Precautions: Fall Precaution Comments: pt thinks he can use heel to WB on LLE Required Braces or Orthoses: Other Brace Other Brace: post op shoe, refuses to wear Restrictions Weight Bearing Restrictions: Yes LLE Weight Bearing: Non weight bearing      Mobility  Bed Mobility Overal bed mobility: Needs Assistance Bed Mobility: Sit to Supine       Sit to supine: Supervision        Transfers Overall transfer level: Needs assistance Equipment used: Rolling walker (2 wheeled) Transfers: Sit to/from Stand Sit to Stand: Min guard         General transfer comment: cue for wb status  Ambulation/Gait Ambulation/Gait assistance: Supervision;Min  guard Gait Distance (Feet): 18 Feet Assistive device: Rolling walker (2 wheeled)   Gait velocity: decreased Gait velocity interpretation: <1.31 ft/sec, indicative of household ambulator General Gait Details: continual cues to reduce WB on L LE, tends to want to put heel down and does not have his protective shoe for LLE with him  Stairs            Wheelchair Mobility    Modified Rankin (Stroke Patients Only)       Balance Overall balance assessment: Needs assistance Sitting-balance support: Feet supported Sitting balance-Leahy Scale: Normal     Standing balance support: Bilateral upper extremity supported;During functional activity Standing balance-Leahy Scale: Poor                               Pertinent Vitals/Pain Pain Assessment: No/denies pain    Home Living Family/patient expects to be discharged to:: Skilled nursing facility Living Arrangements: Alone Available Help at Discharge: Friend(s);Available PRN/intermittently Type of Home: Apartment Home Access: Level entry     Home Layout: One level Home Equipment: Shower seat Additional Comments: pt is unaware of what things from home are with him    Prior Function Level of Independence: Independent         Comments: I but had some falls     Hand Dominance   Dominant Hand: Right    Extremity/Trunk Assessment  Upper Extremity Assessment Upper Extremity Assessment: Overall WFL for tasks assessed    Lower Extremity Assessment Lower Extremity Assessment: RLE deficits/detail;LLE deficits/detail RLE Deficits / Details: general weakness RLE Sensation: history of peripheral neuropathy RLE Coordination: decreased gross motor LLE Deficits / Details: recent surgery on R foot at great toe site LLE: Unable to fully assess due to immobilization LLE Sensation: history of peripheral neuropathy LLE Coordination: decreased gross motor    Cervical / Trunk Assessment Cervical / Trunk  Assessment: Normal  Communication   Communication: No difficulties  Cognition Arousal/Alertness: Awake/alert Behavior During Therapy: WFL for tasks assessed/performed Overall Cognitive Status: Within Functional Limits for tasks assessed Area of Impairment: Problem solving;Awareness;Safety/judgement;Following commands;Memory;Attention;Orientation                 Orientation Level: Situation Current Attention Level: Selective Memory: Decreased short-term memory;Decreased recall of precautions Following Commands: Follows one step commands inconsistently;Follows one step commands with increased time Safety/Judgement: Decreased awareness of deficits;Decreased awareness of safety Awareness: Intellectual Problem Solving: Requires verbal cues General Comments: cues for NWB and is still unreliable      General Comments General comments (skin integrity, edema, etc.): pt is up on walker in his room and not safely able to maneuver with WB limits from MD consistently.  Talked with pt at length about the risks and he is not able to maintain WB limits reliably.    Exercises     Assessment/Plan    PT Assessment Patient needs continued PT services  PT Problem List Decreased strength;Decreased range of motion;Decreased activity tolerance;Decreased balance;Decreased mobility;Decreased coordination;Decreased knowledge of use of DME;Decreased safety awareness;Decreased skin integrity       PT Treatment Interventions DME instruction;Gait training;Functional mobility training;Therapeutic activities;Therapeutic exercise;Balance training;Neuromuscular re-education;Patient/family education    PT Goals (Current goals can be found in the Care Plan section)  Acute Rehab PT Goals Patient Stated Goal: go to rehab and then home PT Goal Formulation: With patient Time For Goal Achievement: 03/18/21 Potential to Achieve Goals: Good    Frequency Min 3X/week   Barriers to discharge Decreased caregiver  support home with limited help    Co-evaluation               AM-PAC PT "6 Clicks" Mobility  Outcome Measure Help needed turning from your back to your side while in a flat bed without using bedrails?: None Help needed moving from lying on your back to sitting on the side of a flat bed without using bedrails?: A Little Help needed moving to and from a bed to a chair (including a wheelchair)?: A Little Help needed standing up from a chair using your arms (e.g., wheelchair or bedside chair)?: A Little Help needed to walk in hospital room?: A Little Help needed climbing 3-5 steps with a railing? : A Lot 6 Click Score: 18    End of Session Equipment Utilized During Treatment: Gait belt Activity Tolerance: Patient limited by fatigue Patient left: in bed;with bed alarm set Nurse Communication: Mobility status PT Visit Diagnosis: Unsteadiness on feet (R26.81);Muscle weakness (generalized) (M62.81);Difficulty in walking, not elsewhere classified (R26.2)    Time: 8588-5027 PT Time Calculation (min) (ACUTE ONLY): 29 min   Charges:   PT Evaluation $PT Eval Moderate Complexity: 1 Mod PT Treatments $Gait Training: 8-22 mins       Ivar Drape 03/04/2021, 5:18 PM Samul Dada, PT MS Acute Rehab Dept. Number: [facility] [id number] and [facility] [phone]

## 2021-03-05 LAB — GLUCOSE, CAPILLARY
Glucose-Capillary: 136 mg/dL — ABNORMAL HIGH (ref 70–99)
Glucose-Capillary: 162 mg/dL — ABNORMAL HIGH (ref 70–99)
Glucose-Capillary: 203 mg/dL — ABNORMAL HIGH (ref 70–99)
Glucose-Capillary: 158 mg/dL — ABNORMAL HIGH (ref 70–99)

## 2021-03-05 LAB — CBC
HCT: 33.5 % — ABNORMAL LOW (ref 39.0–52.0)
Hemoglobin: 10.7 g/dL — ABNORMAL LOW (ref 13.0–17.0)
MCH: 28.3 pg (ref 26.0–34.0)
MCHC: 31.9 g/dL (ref 30.0–36.0)
MCV: 88.6 fL (ref 80.0–100.0)
Platelets: 304 10*3/uL (ref 150–400)
RBC: 3.78 MIL/uL — ABNORMAL LOW (ref 4.22–5.81)
RDW: 12.5 % (ref 11.5–15.5)
WBC: 8 10*3/uL (ref 4.0–10.5)
nRBC: 0 % (ref 0.0–0.2)

## 2021-03-05 LAB — BASIC METABOLIC PANEL
Anion gap: 6 (ref 5–15)
BUN: 33 mg/dL — ABNORMAL HIGH (ref 6–20)
CO2: 26 mmol/L (ref 22–32)
Calcium: 8.9 mg/dL (ref 8.9–10.3)
Chloride: 103 mmol/L (ref 98–111)
Creatinine, Ser: 1.06 mg/dL (ref 0.61–1.24)
GFR, Estimated: >60 mL/min (ref >60)
Glucose, Bld: 135 mg/dL — ABNORMAL HIGH (ref 70–99)
Potassium: 4.9 mmol/L (ref 3.5–5.1)
Sodium: 135 mmol/L (ref 135–145)

## 2021-03-05 NOTE — Progress Notes (Signed)
Patient stable. Left foot has pain but wound VAC is functional Toes perfused Dressing dry Anticipate disposition next week

## 2021-03-06 ENCOUNTER — Encounter (HOSPITAL_COMMUNITY): Payer: Self-pay | Admitting: Orthopedic Surgery

## 2021-03-06 LAB — GLUCOSE, CAPILLARY
Glucose-Capillary: 151 mg/dL — ABNORMAL HIGH (ref 70–99)
Glucose-Capillary: 168 mg/dL — ABNORMAL HIGH (ref 70–99)
Glucose-Capillary: 139 mg/dL — ABNORMAL HIGH (ref 70–99)
Glucose-Capillary: 181 mg/dL — ABNORMAL HIGH (ref 70–99)

## 2021-03-06 LAB — SARS CORONAVIRUS 2 (TAT 6-24 HRS): SARS Coronavirus 2: NEGATIVE

## 2021-03-06 MED ORDER — SULFAMETHOXAZOLE-TRIMETHOPRIM 800-160 MG PO TABS: 1.0000 | ORAL_TABLET | Freq: Two times a day (BID) | ORAL | 0 refills | Status: DC

## 2021-03-06 MED ORDER — OXYCODONE HCL 10 MG PO TABS: 10.0000 mg | ORAL_TABLET | ORAL | 0 refills | Status: DC | PRN

## 2021-03-06 NOTE — TOC Initial Note (Addendum)
Transition of Care [facility]) - Initial/Assessment Note    Patient Details  Name: Brett White MRN: [id number] Date of Birth: Apr 17, 1961  Transition of Care [facility]) CM/SW Contact:    Jimmy Picket, LCSWA Phone Number: 03/06/2021, 11:12 AM  Clinical Narrative:                 CSW spoke to pt at bedside. Pt stated he has been at [facility] for about the last 10 day and he plans on returning to complete rehab. Pt reports he is covid vaccinated with booster.   CSW reached out to admissions coordinator at [facility]. They stated pt can come back. CSW will need to restart insurance auth. Pt has a pending covid test. Pt should DC to [facility] after insurance auth and covid test are received.   Pt is not managed by [facility] with [employer]. CSW reached out to admission coordinator at [facility] to inform them they will have to start insurance auth.   Expected Discharge Plan: Skilled Nursing Facility Barriers to Discharge: Continued Medical Work up   Patient Goals and CMS Choice Patient states their goals for this hospitalization and ongoing recovery are:: TO get better CMS Medicare.gov Compare Post Acute Care list provided to:: Patient Choice offered to / list presented to : Patient  Expected Discharge Plan and Services Expected Discharge Plan: Skilled Nursing Facility       Living arrangements for the past 2 months: Single Family Home Expected Discharge Date: 03/06/21                                    Prior Living Arrangements/Services Living arrangements for the past 2 months: Single Family Home Lives with:: Self Patient language and need for interpreter reviewed:: Yes Do you feel safe going back to the place where you live?: Yes      Need for Family Participation in Patient Care: Yes (Comment) Care giver support system in place?: No (comment)   Criminal Activity/Legal Involvement Pertinent to Current Situation/Hospitalization: No - Comment as needed  Activities of  Daily Living Home Assistive Devices/Equipment: CBG Meter ADL Screening (condition at time of admission) Patient's cognitive ability adequate to safely complete daily activities?: Yes Is the patient deaf or have difficulty hearing?: No Does the patient have difficulty seeing, even when wearing glasses/contacts?: Yes Does the patient have difficulty concentrating, remembering, or making decisions?: No Patient able to express need for assistance with ADLs?: Yes Does the patient have difficulty dressing or bathing?: No Independently performs ADLs?: Yes (appropriate for developmental age) Does the patient have difficulty walking or climbing stairs?: Yes Weakness of Legs: Left Weakness of Arms/Hands: None  Permission Sought/Granted Permission sought to share information with : Family Supports Permission granted to share information with : Yes, Verbal Permission Granted     Permission granted to share info w AGENCY: SNF        Emotional Assessment Appearance:: Appears stated age Attitude/Demeanor/Rapport: Engaged Affect (typically observed): Appropriate Orientation: : Oriented to Self,Oriented to Place,Oriented to  Time,Oriented to Situation Alcohol / Substance Use: Not Applicable Psych Involvement: No (comment)  Admission diagnosis:  Abscess of left foot [L02.612] Patient Active Problem List   Diagnosis Date Noted  . Abscess of left foot 03/03/2021  . Cutaneous abscess of left foot   . 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  . Uncontrolled type 2 diabetes mellitus with hyperglycemia, without long-term current use of insulin (  HCC) 02/03/2021  . Essential hypertension 02/03/2021  . Lactic acidosis 02/03/2021  . Diabetic peripheral neuropathy associated with type 2 diabetes mellitus (HCC) 02/03/2021  . Osteomyelitis (HCC) 02/03/2021  . Cellulitis of toe of left foot    PCP:  Patient, [name] Pharmacy:  No Pharmacies Listed    Social Determinants  of Health (SDOH) Interventions    Readmission Risk Interventions No flowsheet data found.  Jimmy Picket, Theresia Majors, [place] Clinical Social Worker [phone]

## 2021-03-06 NOTE — Care Management Important Message (Signed)
Important Message  Patient Details  Name: Brett White MRN: [id number] Date of Birth: 24-Sep-1961   Medicare Important Message Given:  Yes  Patient has a precaution in place will mail IM document to home address.    Necole Minassian 03/06/2021, 3:41 PM

## 2021-03-06 NOTE — NC FL2 (Signed)
[place] MEDICAID FL2 LEVEL OF CARE SCREENING TOOL     IDENTIFICATION  Patient Name: Brett White Birthdate: 07-May-1961 Sex: male Admission Date (Current Location): 03/03/2021  [facility] and [place] Number:  Producer, television/film/video and Address:  [facility], [address] [place], [place] [address]      Provider Number: [id number]  Attending Physician Name and Address:  [name], MD  Relative Name and Phone Number:       Current Level of Care: Hospital Recommended Level of Care: Skilled Nursing Facility Prior Approval Number:    Date Approved/Denied:   PASRR Number: [id number]  Discharge Plan: SNF    Current Diagnoses: Patient Active Problem List   Diagnosis Date Noted  . Abscess of left foot 03/03/2021  . Cutaneous abscess of left foot   . 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  . Uncontrolled type 2 diabetes mellitus with hyperglycemia, without long-term current use of insulin (HCC) 02/03/2021  . Essential hypertension 02/03/2021  . Lactic acidosis 02/03/2021  . Diabetic peripheral neuropathy associated with type 2 diabetes mellitus (HCC) 02/03/2021  . Osteomyelitis (HCC) 02/03/2021  . Cellulitis of toe of left foot     Orientation RESPIRATION BLADDER Height & Weight     Self,Time,Situation,Place  Normal Continent Weight: 214 lb 1.1 oz (97.1 kg) Height:  6\' 3"  (190.5 cm)  BEHAVIORAL SYMPTOMS/MOOD NEUROLOGICAL BOWEL NUTRITION STATUS      Continent Diet (See DC summary)  AMBULATORY STATUS COMMUNICATION OF NEEDS Skin   Limited Assist Verbally Surgical wounds (Surgical wounds on L foot and Great toe amputation)                       Personal Care Assistance Level of Assistance  Bathing,Feeding,Dressing Bathing Assistance: Limited assistance Feeding assistance: Independent Dressing Assistance: Limited assistance     Functional Limitations Info  Sight,Hearing,Speech Sight Info: Adequate Hearing Info:  Adequate Speech Info: Adequate    SPECIAL CARE FACTORS FREQUENCY  PT (By licensed PT),OT (By licensed OT)     PT Frequency: 3x a week OT Frequency: 3 x a week            Contractures Contractures Info: Not present    Additional Factors Info  Code Status,Allergies,Insulin Sliding Scale Code Status Info: Full Allergies Info: NKA           Current Medications (03/06/2021):  This is the current hospital active medication list Current Facility-Administered Medications  Medication Dose Route Frequency Provider Last Rate Last Admin  . 0.9 %  sodium chloride infusion   Intravenous Continuous Persons, 05/06/2021, [place]   Stopped at 03/04/21 1412  . acetaminophen (TYLENOL) tablet 325-650 mg  325-650 mg Oral Q6H PRN Persons, 05/04/21, [place]      . alum & mag hydroxide-simeth (MAALOX/MYLANTA) 200-200-20 MG/5ML suspension 15-30 mL  15-30 mL Oral Q2H PRN Persons, 01-22-1973, [place]      . ascorbic acid (VITAMIN C) tablet 1,000 mg  1,000 mg Oral Daily Persons, [place], PA   1,000 mg at 03/06/21 0929  . bisacodyl (DULCOLAX) EC tablet 5 mg  5 mg Oral Daily PRN Persons, 05/06/21, PA      . calcium carbonate (TUMS - dosed in mg elemental calcium) chewable tablet 200 mg of elemental calcium  1 tablet Oral TID Persons, [place], PA   200 mg of elemental calcium at 03/06/21 0928  . docusate sodium (COLACE) capsule 100 mg  100  mg Oral Daily Persons, [place], [place]   100 mg at 03/06/21 4010  . DULoxetine (CYMBALTA) DR capsule 30 mg  30 mg Oral Daily Persons, [place], PA   30 mg at 03/06/21 0929  . gabapentin (NEURONTIN) capsule 600 mg  600 mg Oral TID Persons, [place], PA   600 mg at 03/06/21 0929  . guaiFENesin-dextromethorphan (ROBITUSSIN DM) 100-10 MG/5ML syrup 15 mL  15 mL Oral Q4H PRN Persons, [place], PA      . hydrochlorothiazide (HYDRODIURIL) tablet 12.5 mg  12.5 mg Oral Daily [name], MD   12.5 mg at 03/06/21 0929  . HYDROmorphone (DILAUDID) injection 0.5-1 mg  0.5-1 mg Intravenous Q4H  PRN Persons, [place], PA   1 mg at 03/06/21 0931  . insulin aspart (novoLOG) injection 0-15 Units  0-15 Units Subcutaneous TID WC Persons, [place], [place]   3 Units at 03/06/21 0930  . insulin aspart (novoLOG) injection 2 Units  2 Units Subcutaneous TID WC [name], MD   2 Units at 03/06/21 [phone]  . insulin glargine (LANTUS) injection 20 Units  20 Units Subcutaneous QHS [name], MD   20 Units at 03/05/21 2144  . magnesium citrate solution 1 Bottle  1 Bottle Oral Once PRN Persons, [place], PA      . methocarbamol (ROBAXIN) tablet 500 mg  500 mg Oral Q6H PRN Persons, [place], PA   500 mg at 03/05/21 1542  . nutrition supplement (JUVEN) (JUVEN) powder packet 1 packet  1 packet Oral BID BM Persons, [place], [place]   1 packet at 03/05/21 1307  . ondansetron (ZOFRAN) injection 4 mg  4 mg Intravenous Q6H PRN Persons, [place], PA      . oxyCODONE (Oxy IR/ROXICODONE) immediate release tablet 10-15 mg  10-15 mg Oral Q4H PRN Persons, [place], PA   15 mg at 03/06/21 0655  . oxyCODONE (Oxy IR/ROXICODONE) immediate release tablet 5-10 mg  5-10 mg Oral Q4H PRN Persons, [place], PA   10 mg at 03/03/21 1952  . pantoprazole (PROTONIX) EC tablet 40 mg  40 mg Oral Daily Persons, [place], [place]   40 mg at 03/06/21 3664  . phenol (CHLORASEPTIC) mouth spray 1 spray  1 spray Mouth/Throat PRN Persons, [place], PA      . polyethylene glycol (MIRALAX / GLYCOLAX) packet 17 g  17 g Oral Daily PRN Persons, [place], PA      . vancomycin (VANCOREADY) IVPB 1250 mg/250 mL  1,250 mg Intravenous Q12H Mosetta Anis, RPH 166.7 mL/hr at 03/06/21 0931 1,250 mg at 03/06/21 0931  . zinc sulfate capsule 220 mg  220 mg Oral Daily Persons, [place], [place]   220 mg at 03/06/21 4034     Discharge Medications: Please see discharge summary for a list of discharge medications.  Relevant Imaging Results:  Relevant Lab Results:   Additional Information SS# [id number] [place]

## 2021-03-06 NOTE — Progress Notes (Signed)
Patient is postop day 3 status post irrigation and debridement of his foot.  Cultures have been grown out rare MRSA that is sensitive to Bactrim.  Patient says that he was contacted by the nursing facility that and once he discharges today he will forefit his room at the nursing facility Wound VAC is functioning with 1 green check.  There is 30 cc of strawberry colored fluid in the back.  Have ordered COVID test patient may be discharged back to nursing facility today with follow-up in our office in 1 week.  Pain medication prescriptions has been placed on the chart

## 2021-03-06 NOTE — Discharge Summary (Signed)
Discharge Diagnoses:  Active Problems:   Cutaneous abscess of left foot   Abscess of left foot   Surgeries: Procedure(s): LEFT FOOT DEBRIDEMENT on 03/03/2021    Consultants:   Discharged Condition: Improved  Hospital Course: Brett White is an 60 y.o. male who was admitted 03/03/2021 with a chief complaint of abscess left foot, with a final diagnosis of abscess left foot.  Patient was brought to the operating room on 03/03/2021 and underwent Procedure(s): LEFT FOOT DEBRIDEMENT.    Patient was given perioperative antibiotics:  Anti-infectives (From admission, onward)   Start     Dose/Rate Route Frequency Ordered Stop   03/06/21 0000  sulfamethoxazole-trimethoprim (BACTRIM DS) 800-160 MG tablet  Status:  Discontinued        1 tablet Oral 2 times daily 03/06/21 0650 03/06/21    03/03/21 2000  piperacillin-tazobactam (ZOSYN) IVPB 3.375 g  Status:  Discontinued        3.375 g 12.5 mL/hr over 240 Minutes Intravenous Every 8 hours 03/03/21 1902 03/05/21 1241   03/03/21 2000  vancomycin (VANCOREADY) IVPB 1250 mg/250 mL        1,250 mg 166.7 mL/hr over 90 Minutes Intravenous Every 12 hours 03/03/21 1902     03/03/21 1333  vancomycin (VANCOCIN) powder  Status:  Discontinued          As needed 03/03/21 1333 03/03/21 1356   03/03/21 1332  gentamicin (GARAMYCIN) injection  Status:  Discontinued          As needed 03/03/21 1333 03/03/21 1356   03/03/21 1200  ceFAZolin (ANCEF) IVPB 2g/100 mL premix        2 g 200 mL/hr over 30 Minutes Intravenous On call to O.R. 03/03/21 1149 03/03/21 1350    .  Patient was given sequential compression devices, early ambulation, and aspirin for DVT prophylaxis.  Recent vital signs:  Patient Vitals for the past 24 hrs:  BP Temp Temp src Pulse Resp SpO2  03/06/21 0439 (!) 141/82 (!) 97.5 F (36.4 C) Oral 76 18 95 %  03/05/21 2024 (!) 148/88 98 F (36.7 C) Oral 85 18 97 %  03/05/21 1542 (!) 154/93 (!) 97.5 F (36.4 C) Oral 79 18 97 %  .  Recent laboratory  studies: No results found.  Discharge Medications:   Allergies as of 03/06/2021   No Known Allergies     Medication List    STOP taking these medications   traMADol 50 MG tablet Commonly known as: ULTRAM     TAKE these medications   TUMS PO Take 1 tablet by mouth 3 (three) times daily with meals.   calcium carbonate 500 MG chewable tablet Commonly known as: TUMS - dosed in mg elemental calcium Chew 1 tablet (200 mg of elemental calcium total) by mouth 3 (three) times daily.   diphenhydramine-acetaminophen 25-500 MG Tabs tablet Commonly known as: TYLENOL PM Take 1 tablet by mouth at bedtime as needed (sleep/pain).   docusate sodium 100 MG capsule Commonly known as: COLACE Take 1 capsule (100 mg total) by mouth daily.   doxycycline 100 MG tablet Commonly known as: VIBRA-TABS Take 100 mg by mouth 2 (two) times daily.   DULoxetine 30 MG capsule Commonly known as: CYMBALTA Take 30 mg by mouth daily.   gabapentin 300 MG capsule Commonly known as: NEURONTIN Take 2 capsules (600 mg total) by mouth 3 (three) times daily. What changed:   how much to take  Another medication with the same name was removed. Continue taking this medication,  and follow the directions you see here.   hydrochlorothiazide 12.5 MG tablet Commonly known as: HYDRODIURIL Take 1 tablet (12.5 mg total) by mouth daily.   ibuprofen 600 MG tablet Commonly known as: ADVIL Take 1 tablet (600 mg total) by mouth every 6 (six) hours as needed. What changed: reasons to take this   Lantus SoloStar 100 UNIT/ML Solostar Pen Generic drug: insulin glargine Inject 25 Units into the skin at bedtime.   methocarbamol 500 MG tablet Commonly known as: ROBAXIN Take 1 tablet (500 mg total) by mouth every 6 (six) hours as needed for muscle spasms.   multivitamin with minerals Tabs tablet Take 1 tablet by mouth daily.   NovoLOG FlexPen 100 UNIT/ML FlexPen Generic drug: insulin aspart Inject 5 Units into the skin  with breakfast, with lunch, and with evening meal.   ondansetron 4 MG disintegrating tablet Commonly known as: Zofran ODT 4mg  ODT q4 hours prn nausea/vomit What changed:   how much to take  how to take this  when to take this  reasons to take this  additional instructions   Oxycodone HCl 10 MG Tabs Take 1-1.5 tablets (10-15 mg total) by mouth every 4 (four) hours as needed for severe pain (pain score 7-10). What changed:   medication strength  how much to take  reasons to take this   pantoprazole 40 MG tablet Commonly known as: PROTONIX Take 1 tablet (40 mg total) by mouth daily.   polyethylene glycol 17 g packet Commonly known as: MIRALAX / GLYCOLAX Take 17 g by mouth 2 (two) times daily. What changed: when to take this   senna 8.6 MG Tabs tablet Commonly known as: SENOKOT Take 1 tablet by mouth 2 (two) times daily.   senna-docusate 8.6-50 MG tablet Commonly known as: Senokot-S Take 1 tablet by mouth 2 (two) times daily.   zinc sulfate 220 (50 Zn) MG capsule Take 1 capsule (220 mg total) by mouth daily.       Diagnostic Studies: DG Chest Port 1 View  Result Date: 02/21/2021 CLINICAL DATA:  Chest pain. EXAM: PORTABLE CHEST 1 VIEW COMPARISON:  02/02/2021 FINDINGS: Stable heart size and mediastinal contours. Mild aortic tortuosity. Minor bibasilar atelectasis. No confluent consolidation. No pleural effusion or pneumothorax. No acute osseous abnormalities are seen. IMPRESSION: Minor bibasilar atelectasis. Electronically Signed   By: 04/04/2021 M.D.   On: 02/21/2021 20:12   DG Foot Complete Left  Result Date: 02/21/2021 CLINICAL DATA:  Left foot swelling. Recent surgery. Rule out osteomyelitis. EXAM: LEFT FOOT - COMPLETE 3+ VIEW COMPARISON:  Preoperative imaging 02/02/2021 FINDINGS: Resection of the first ray at the proximal metatarsal, resection margin is smooth. Soft tissue edema throughout the operative bed with occasional foci of air. There is a small  osseous density about the medial aspect of the medial cuneiform that was not definitively seen on prior. No other erosive or destructive change. Mild hammertoe deformity of the digits. No radiopaque foreign body. Moderate plantar calcaneal spur and Achilles tendon enthesophyte. IMPRESSION: 1. Post resection of the first ray at the proximal metatarsal. Soft tissue edema throughout the operative bed with occasional foci of air. This may be due to postsurgical change or soft tissue infection. 2. Small osseous density about the medial aspect of the medial cuneiform, not definitively seen on prior, unclear if this is postsurgical or may represent early osteomyelitis. Electronically Signed   By: 04/04/2021 M.D.   On: 02/21/2021 20:15   XR Foot 2 Views Left  Result Date: 03/02/2021  2 view radiographs of the left foot shows the first ray amputation with some irregularity at the distal aspect of the medial cuneiform.   Patient benefited maximally from their hospital stay and there were no complications.     Disposition: Discharge disposition: 03-Skilled Nursing Facility      Discharge Instructions    Call MD / Call 911   Complete by: As directed    If you experience chest pain or shortness of breath, CALL 911 and be transported to the hospital emergency room.  If you develope a fever above 101 F, pus (white drainage) or increased drainage or redness at the wound, or calf pain, call your surgeon's office.   Constipation Prevention   Complete by: As directed    Drink plenty of fluids.  Prune juice may be helpful.  You may use a stool softener, such as Colace (over the counter) 100 mg twice a day.  Use MiraLax (over the counter) for constipation as needed.   Diet - low sodium heart healthy   Complete by: As directed    Discharge instructions   Complete by: As directed    Nonweight bearing. Please call office if vac alarms   Increase activity slowly as tolerated   Complete by: As directed     Negative Pressure Wound Therapy - Incisional   Complete by: As directed    Show patient how to attach preveena vac. Should call office if alarms   Post-operative opioid taper instructions:   Complete by: As directed    POST-OPERATIVE OPIOID TAPER INSTRUCTIONS: It is important to wean off of your opioid medication as soon as possible. If you do not need pain medication after your surgery it is ok to stop day one. Opioids include: Codeine, Hydrocodone(Norco, Vicodin), Oxycodone(Percocet, oxycontin) and hydromorphone amongst others.  Long term and even short term use of opiods can cause: Increased pain response Dependence Constipation Depression Respiratory depression And more.  Withdrawal symptoms can include Flu like symptoms Nausea, vomiting And more Techniques to manage these symptoms Hydrate well Eat regular healthy meals Stay active Use relaxation techniques(deep breathing, meditating, yoga) Do Not substitute Alcohol to help with tapering If you have been on opioids for less than two weeks and do not have pain than it is ok to stop all together.  Plan to wean off of opioids This plan should start within one week post op of your joint replacement. Maintain the same interval or time between taking each dose and first decrease the dose.  Cut the total daily intake of opioids by one tablet each day Next start to increase the time between doses. The last dose that should be eliminated is the evening dose.         Follow-up Information    Adonis Huguenin, NP In 1 week.   Specialty: Orthopedic Surgery Contact information: [address] [place] [address] [phone]                Signed: [place] Capitola Ladson 03/06/2021, 10:25 AM

## 2021-03-06 NOTE — Care Management CC44 (Signed)
Condition Code 44 Documentation Completed  Patient Details  Name: Rhea Thrun MRN: [id number] Date of Birth: 03-Mar-1961   Condition Code 44 given:  Yes Patient signature on Condition Code 44 notice:  Yes Documentation of 2 MD's agreement:  Yes Code 44 added to claim:  Yes    Kingsley Plan, RN 03/06/2021, 11:54 AM

## 2021-03-06 NOTE — Care Management Important Message (Signed)
Important Message  Patient Details  Name: Brett White MRN: [id number] Date of Birth: 1961-03-27   Medicare Important Message Given:  Yes     [place] Stefan Church 03/06/2021, 4:01 PM

## 2021-03-06 NOTE — Progress Notes (Signed)
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 He was admitted for pain and swelling in operative foot. He is now s/p L foot debridement with wound vac. PMH includes:  DM II with neuropathy and prior back surgery.    PT Comments    Pt was seen for mobility today with attempt to walk, but ceased due to pt being unable to maintain NWB.  He is able to stand without LLE but once up immediately puts L heel down.  Following along with pt to progress with gait and transfers, and expecting him to make the transition to standing NWB and maintaining for length of gait.  Will progress with pt to do more independent transfers first, and does STS with bedrail and min guard on LLE.  Follow along for goals of acute PT>     Follow Up Recommendations  SNF     Equipment Recommendations  None recommended by PT    Recommendations for Other Services       Precautions / Restrictions Precautions Precautions: Fall Precaution Comments: prefers to WB on L heel despite reminders Restrictions Weight Bearing Restrictions: Yes LLE Weight Bearing: Non weight bearing    Mobility  Bed Mobility Overal bed mobility: Needs Assistance Bed Mobility: Supine to Sit;Sit to Supine     Supine to sit: Supervision Sit to supine: Supervision   General bed mobility comments: safety observation    Transfers Overall transfer level: Needs assistance Equipment used: Rolling walker (2 wheeled) Transfers: Sit to/from Stand Sit to Stand: Min guard         General transfer comment: NWB cues and reminders for maintaining hand placement safely  Ambulation/Gait Ambulation/Gait assistance: Min guard Gait Distance (Feet): 3 Feet Assistive device: Rolling walker (2 wheeled)   Gait velocity: decreased Gait velocity  interpretation: <1.31 ft/sec, indicative of household ambulator General Gait Details: pt is able to stand with LLE off floor but then despite cues immediately puts that foot down on the ground.  had to seat him quickly for safety and avoiding precautions   Stairs             Wheelchair Mobility    Modified Rankin (Stroke Patients Only)       Balance Overall balance assessment: Needs assistance Sitting-balance support: Feet supported Sitting balance-Leahy Scale: Good     Standing balance support: Bilateral upper extremity supported;During functional activity Standing balance-Leahy Scale: Poor                              Cognition Arousal/Alertness: Awake/alert Behavior During Therapy: WFL for tasks assessed/performed Overall Cognitive Status: Within Functional Limits for tasks assessed                           Safety/Judgement: Decreased awareness of safety;Decreased awareness of deficits            Exercises General Exercises - Lower Extremity Ankle Circles/Pumps: AAROM;AROM;5 reps Quad Sets: AROM;10 reps Heel Slides: AROM;10 reps Hip ABduction/ADduction: AROM;10 reps Straight Leg Raises: AROM;10 reps    General Comments General comments (skin integrity, edema, etc.): Pt is unsafe to walk due to WB issue and his inabiltiy to balance with one leg      Pertinent Vitals/Pain Pain Assessment: Faces Faces  Pain Scale: Hurts even more Pain Location: L foot Pain Descriptors / Indicators: Burning;Grimacing Pain Intervention(s): Monitored during session;Repositioned;Premedicated before session;Patient requesting pain meds-RN notified    Home Living                      Prior Function            PT Goals (current goals can now be found in the care plan section) Acute Rehab PT Goals Patient Stated Goal: go to rehab and then home    Frequency    Min 3X/week      PT Plan Current plan remains appropriate     Co-evaluation              AM-PAC PT "6 Clicks" Mobility   Outcome Measure  Help needed turning from your back to your side while in a flat bed without using bedrails?: None Help needed moving from lying on your back to sitting on the side of a flat bed without using bedrails?: A Little Help needed moving to and from a bed to a chair (including a wheelchair)?: A Little Help needed standing up from a chair using your arms (e.g., wheelchair or bedside chair)?: A Little Help needed to walk in hospital room?: A Little Help needed climbing 3-5 steps with a railing? : A Lot 6 Click Score: 18    End of Session Equipment Utilized During Treatment: Gait belt Activity Tolerance: Patient limited by fatigue Patient left: in bed;with call bell/phone within reach;with bed alarm set Nurse Communication: Mobility status PT Visit Diagnosis: Unsteadiness on feet (R26.81);Difficulty in walking, not elsewhere classified (R26.2);Pain Pain - Right/Left: Left Pain - part of body: Ankle and joints of foot     Time: 1445-1513 PT Time Calculation (min) (ACUTE ONLY): 28 min  Charges:  $Therapeutic Exercise: 8-22 mins $Therapeutic Activity: 8-22 mins                  Ivar Drape 03/06/2021, 4:47 PM  Samul Dada, PT MS Acute Rehab Dept. Number: [facility] [id number] and [facility] [phone]

## 2021-03-06 NOTE — Plan of Care (Signed)
  Problem: Education: Goal: Required Educational Video(s) Outcome: Progressing   Problem: Clinical Measurements: Goal: Postoperative complications will be avoided or minimized Outcome: Progressing   Problem: Skin Integrity: Goal: Demonstration of wound healing without infection will improve Outcome: Progressing   Problem: Education: Goal: Knowledge of General Education information will improve Description: Including pain rating scale, medication(s)/side effects and non-pharmacologic comfort measures Outcome: Progressing   Problem: Health Behavior/Discharge Planning: Goal: Ability to manage health-related needs will improve Outcome: Progressing   

## 2021-03-06 NOTE — Care Management Obs Status (Signed)
MEDICARE OBSERVATION STATUS NOTIFICATION   Patient Details  Name: Brett White MRN: [id number] Date of Birth: [birth year]   Medicare Observation Status Notification Given:  Yes    Kingsley Plan, RN 03/06/2021, 11:54 AM

## 2021-03-06 NOTE — Discharge Summary (Signed)
Discharge Diagnoses:  Active Problems:   Cutaneous abscess of left foot   Abscess of left foot   Surgeries: Procedure(s): LEFT FOOT DEBRIDEMENT on 03/03/2021    Consultants:   Discharged Condition: Improved  Hospital Course: Brett White is an 60 y.o. male who was admitted 03/03/2021 with a chief complaint of left foot wound dehiscence, with a final diagnosis of abscess left foot.  Patient was brought to the operating room on 03/03/2021 and underwent Procedure(s): LEFT FOOT DEBRIDEMENT.    Patient was given perioperative antibiotics:  Anti-infectives (From admission, onward)   Start     Dose/Rate Route Frequency Ordered Stop   03/06/21 0000  sulfamethoxazole-trimethoprim (BACTRIM DS) 800-160 MG tablet        1 tablet Oral 2 times daily 03/06/21 0650     03/03/21 2000  piperacillin-tazobactam (ZOSYN) IVPB 3.375 g  Status:  Discontinued        3.375 g 12.5 mL/hr over 240 Minutes Intravenous Every 8 hours 03/03/21 1902 03/05/21 1241   03/03/21 2000  vancomycin (VANCOREADY) IVPB 1250 mg/250 mL        1,250 mg 166.7 mL/hr over 90 Minutes Intravenous Every 12 hours 03/03/21 1902     03/03/21 1333  vancomycin (VANCOCIN) powder  Status:  Discontinued          As needed 03/03/21 1333 03/03/21 1356   03/03/21 1332  gentamicin (GARAMYCIN) injection  Status:  Discontinued          As needed 03/03/21 1333 03/03/21 1356   03/03/21 1200  ceFAZolin (ANCEF) IVPB 2g/100 mL premix        2 g 200 mL/hr over 30 Minutes Intravenous On call to O.R. 03/03/21 1149 03/03/21 1350    .  Patient was given sequential compression devices, early ambulation, and aspirin for DVT prophylaxis.  Recent vital signs:  Patient Vitals for the past 24 hrs:  BP Temp Temp src Pulse Resp SpO2  03/06/21 0439 (!) 141/82 (!) 97.5 F (36.4 C) Oral 76 18 95 %  03/05/21 2024 (!) 148/88 98 F (36.7 C) Oral 85 18 97 %  03/05/21 1542 (!) 154/93 (!) 97.5 F (36.4 C) Oral 79 18 97 %  .  Recent laboratory studies: No results  found.  Discharge Medications:   Allergies as of 03/06/2021   No Known Allergies     Medication List    STOP taking these medications   doxycycline 100 MG tablet Commonly known as: VIBRA-TABS   traMADol 50 MG tablet Commonly known as: ULTRAM     TAKE these medications   TUMS PO Take 1 tablet by mouth 3 (three) times daily with meals.   calcium carbonate 500 MG chewable tablet Commonly known as: TUMS - dosed in mg elemental calcium Chew 1 tablet (200 mg of elemental calcium total) by mouth 3 (three) times daily.   diphenhydramine-acetaminophen 25-500 MG Tabs tablet Commonly known as: TYLENOL PM Take 1 tablet by mouth at bedtime as needed (sleep/pain).   docusate sodium 100 MG capsule Commonly known as: COLACE Take 1 capsule (100 mg total) by mouth daily.   DULoxetine 30 MG capsule Commonly known as: CYMBALTA Take 30 mg by mouth daily.   gabapentin 300 MG capsule Commonly known as: NEURONTIN Take 2 capsules (600 mg total) by mouth 3 (three) times daily. What changed:   how much to take  Another medication with the same name was removed. Continue taking this medication, and follow the directions you see here.   hydrochlorothiazide 12.5 MG  tablet Commonly known as: HYDRODIURIL Take 1 tablet (12.5 mg total) by mouth daily.   ibuprofen 600 MG tablet Commonly known as: ADVIL Take 1 tablet (600 mg total) by mouth every 6 (six) hours as needed. What changed: reasons to take this   Lantus SoloStar 100 UNIT/ML Solostar Pen Generic drug: insulin glargine Inject 25 Units into the skin at bedtime.   methocarbamol 500 MG tablet Commonly known as: ROBAXIN Take 1 tablet (500 mg total) by mouth every 6 (six) hours as needed for muscle spasms.   multivitamin with minerals Tabs tablet Take 1 tablet by mouth daily.   NovoLOG FlexPen 100 UNIT/ML FlexPen Generic drug: insulin aspart Inject 5 Units into the skin with breakfast, with lunch, and with evening meal.    ondansetron 4 MG disintegrating tablet Commonly known as: Zofran ODT 4mg  ODT q4 hours prn nausea/vomit What changed:   how much to take  how to take this  when to take this  reasons to take this  additional instructions   Oxycodone HCl 10 MG Tabs Take 1-1.5 tablets (10-15 mg total) by mouth every 4 (four) hours as needed for severe pain (pain score 7-10). What changed:   medication strength  how much to take  reasons to take this   pantoprazole 40 MG tablet Commonly known as: PROTONIX Take 1 tablet (40 mg total) by mouth daily.   polyethylene glycol 17 g packet Commonly known as: MIRALAX / GLYCOLAX Take 17 g by mouth 2 (two) times daily. What changed: when to take this   senna 8.6 MG Tabs tablet Commonly known as: SENOKOT Take 1 tablet by mouth 2 (two) times daily.   senna-docusate 8.6-50 MG tablet Commonly known as: Senokot-S Take 1 tablet by mouth 2 (two) times daily.   sulfamethoxazole-trimethoprim 800-160 MG tablet Commonly known as: Bactrim DS Take 1 tablet by mouth 2 (two) times daily.   zinc sulfate 220 (50 Zn) MG capsule Take 1 capsule (220 mg total) by mouth daily.       Diagnostic Studies: DG Chest Port 1 View  Result Date: 02/21/2021 CLINICAL DATA:  Chest pain. EXAM: PORTABLE CHEST 1 VIEW COMPARISON:  02/02/2021 FINDINGS: Stable heart size and mediastinal contours. Mild aortic tortuosity. Minor bibasilar atelectasis. No confluent consolidation. No pleural effusion or pneumothorax. No acute osseous abnormalities are seen. IMPRESSION: Minor bibasilar atelectasis. Electronically Signed   By: 04/04/2021 M.D.   On: 02/21/2021 20:12   DG Foot Complete Left  Result Date: 02/21/2021 CLINICAL DATA:  Left foot swelling. Recent surgery. Rule out osteomyelitis. EXAM: LEFT FOOT - COMPLETE 3+ VIEW COMPARISON:  Preoperative imaging 02/02/2021 FINDINGS: Resection of the first ray at the proximal metatarsal, resection margin is smooth. Soft tissue edema  throughout the operative bed with occasional foci of air. There is a small osseous density about the medial aspect of the medial cuneiform that was not definitively seen on prior. No other erosive or destructive change. Mild hammertoe deformity of the digits. No radiopaque foreign body. Moderate plantar calcaneal spur and Achilles tendon enthesophyte. IMPRESSION: 1. Post resection of the first ray at the proximal metatarsal. Soft tissue edema throughout the operative bed with occasional foci of air. This may be due to postsurgical change or soft tissue infection. 2. Small osseous density about the medial aspect of the medial cuneiform, not definitively seen on prior, unclear if this is postsurgical or may represent early osteomyelitis. Electronically Signed   By: 04/04/2021 M.D.   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   Patient benefited maximally from their hospital stay and there were no complications.     Disposition: Discharge disposition: 03-Skilled Nursing Facility      Discharge Instructions    Call MD / Call 911   Complete by: As directed    If you experience chest pain or shortness of breath, CALL 911 and be transported to the hospital emergency room.  If you develope a fever above 101 F, pus (white drainage) or increased drainage or redness at the wound, or calf pain, call your surgeon's office.   Constipation Prevention   Complete by: As directed    Drink plenty of fluids.  Prune juice may be helpful.  You may use a stool softener, such as Colace (over the counter) 100 mg twice a day.  Use MiraLax (over the counter) for constipation as needed.   Diet - low sodium heart healthy   Complete by: As directed    Discharge instructions   Complete by: As directed    Nonweight bearing. Please call office if vac alarms    Increase activity slowly as tolerated   Complete by: As directed    Negative Pressure Wound Therapy - Incisional   Complete by: As directed    Show patient how to attach preveena vac. Should call office if alarms   Post-operative opioid taper instructions:   Complete by: As directed    POST-OPERATIVE OPIOID TAPER INSTRUCTIONS: It is important to wean off of your opioid medication as soon as possible. If you do not need pain medication after your surgery it is ok to stop day one. Opioids include: Codeine, Hydrocodone(Norco, Vicodin), Oxycodone(Percocet, oxycontin) and hydromorphone amongst others.  Long term and even short term use of opiods can cause: Increased pain response Dependence Constipation Depression Respiratory depression And more.  Withdrawal symptoms can include Flu like symptoms Nausea, vomiting And more Techniques to manage these symptoms Hydrate well Eat regular healthy meals Stay active Use relaxation techniques(deep breathing, meditating, yoga) Do Not substitute Alcohol to help with tapering If you have been on opioids for less than two weeks and do not have pain than it is ok to stop all together.  Plan to wean off of opioids This plan should start within one week post op of your joint replacement. Maintain the same interval or time between taking each dose and first decrease the dose.  Cut the total daily intake of opioids by one tablet each day Next start to increase the time between doses. The last dose that should be eliminated is the evening dose.         Follow-up Information    Adonis Huguenin, NP In 1 week.   Specialty: Orthopedic Surgery Contact information: [address] [place] [address] [phone]                Signed: [place] Tina Gruner 03/06/2021, 10:16 AM

## 2021-03-07 LAB — GLUCOSE, CAPILLARY
Glucose-Capillary: 114 mg/dL — ABNORMAL HIGH (ref 70–99)
Glucose-Capillary: 131 mg/dL — ABNORMAL HIGH (ref 70–99)
Glucose-Capillary: 183 mg/dL — ABNORMAL HIGH (ref 70–99)
Glucose-Capillary: 247 mg/dL — ABNORMAL HIGH (ref 70–99)

## 2021-03-07 NOTE — Progress Notes (Signed)
Patient ID: Brett White, male   DOB: [birth year], 60 y.o.   MRN: [id number] Patient's foot shows no swelling the wound VAC canister has 100 cc.  I changed him over to the portable Praveena wound VAC pump this had a good suction fit.  Plan for discharge to skilled nursing today.  Prescriptions on the chart for pain medicine and antibiotics.

## 2021-03-07 NOTE — Discharge Summary (Signed)
Discharge Diagnoses:  Active Problems:   Cutaneous abscess of left foot   Abscess of left foot   Surgeries: Procedure(s): LEFT FOOT DEBRIDEMENT on 03/03/2021    Consultants:   Discharged Condition: Improved  Hospital Course: Brett White is an 60 y.o. male who was admitted 03/03/2021 with a chief complaint of abscess left foot, with a final diagnosis of abscess left foot.  Patient was brought to the operating room on 03/03/2021 and underwent Procedure(s): LEFT FOOT DEBRIDEMENT.    Patient was given perioperative antibiotics:  Anti-infectives (From admission, onward)   Start     Dose/Rate Route Frequency Ordered Stop   03/06/21 0000  sulfamethoxazole-trimethoprim (BACTRIM DS) 800-160 MG tablet  Status:  Discontinued        1 tablet Oral 2 times daily 03/06/21 0650 03/06/21    03/03/21 2000  piperacillin-tazobactam (ZOSYN) IVPB 3.375 g  Status:  Discontinued        3.375 g 12.5 mL/hr over 240 Minutes Intravenous Every 8 hours 03/03/21 1902 03/05/21 1241   03/03/21 2000  vancomycin (VANCOREADY) IVPB 1250 mg/250 mL        1,250 mg 166.7 mL/hr over 90 Minutes Intravenous Every 12 hours 03/03/21 1902     03/03/21 1333  vancomycin (VANCOCIN) powder  Status:  Discontinued          As needed 03/03/21 1333 03/03/21 1356   03/03/21 1332  gentamicin (GARAMYCIN) injection  Status:  Discontinued          As needed 03/03/21 1333 03/03/21 1356   03/03/21 1200  ceFAZolin (ANCEF) IVPB 2g/100 mL premix        2 g 200 mL/hr over 30 Minutes Intravenous On call to O.R. 03/03/21 1149 03/03/21 1350    .  Patient was given sequential compression devices, early ambulation, and aspirin for DVT prophylaxis.  Recent vital signs:  Patient Vitals for the past 24 hrs:  BP Temp Temp src Pulse Resp SpO2  03/07/21 0424 133/89 98.3 F (36.8 C) Oral 79 18 96 %  03/06/21 2133 (!) 153/90 97.9 F (36.6 C) Oral 74 18 95 %  03/06/21 1350 (!) 158/87 98.6 F (37 C) Oral 80 14 96 %  .  Recent laboratory studies: No  results found.  Discharge Medications:   Allergies as of 03/07/2021   No Known Allergies     Medication List    STOP taking these medications   traMADol 50 MG tablet Commonly known as: ULTRAM     TAKE these medications   TUMS PO Take 1 tablet by mouth 3 (three) times daily with meals.   calcium carbonate 500 MG chewable tablet Commonly known as: TUMS - dosed in mg elemental calcium Chew 1 tablet (200 mg of elemental calcium total) by mouth 3 (three) times daily.   diphenhydramine-acetaminophen 25-500 MG Tabs tablet Commonly known as: TYLENOL PM Take 1 tablet by mouth at bedtime as needed (sleep/pain).   docusate sodium 100 MG capsule Commonly known as: COLACE Take 1 capsule (100 mg total) by mouth daily.   doxycycline 100 MG tablet Commonly known as: VIBRA-TABS Take 100 mg by mouth 2 (two) times daily.   DULoxetine 30 MG capsule Commonly known as: CYMBALTA Take 30 mg by mouth daily.   gabapentin 300 MG capsule Commonly known as: NEURONTIN Take 2 capsules (600 mg total) by mouth 3 (three) times daily. What changed:   how much to take  Another medication with the same name was removed. Continue taking this medication, and follow the  directions you see here.   hydrochlorothiazide 12.5 MG tablet Commonly known as: HYDRODIURIL Take 1 tablet (12.5 mg total) by mouth daily.   ibuprofen 600 MG tablet Commonly known as: ADVIL Take 1 tablet (600 mg total) by mouth every 6 (six) hours as needed. What changed: reasons to take this   Lantus SoloStar 100 UNIT/ML Solostar Pen Generic drug: insulin glargine Inject 25 Units into the skin at bedtime.   methocarbamol 500 MG tablet Commonly known as: ROBAXIN Take 1 tablet (500 mg total) by mouth every 6 (six) hours as needed for muscle spasms.   multivitamin with minerals Tabs tablet Take 1 tablet by mouth daily.   NovoLOG FlexPen 100 UNIT/ML FlexPen Generic drug: insulin aspart Inject 5 Units into the skin with  breakfast, with lunch, and with evening meal.   ondansetron 4 MG disintegrating tablet Commonly known as: Zofran ODT 4mg  ODT q4 hours prn nausea/vomit What changed:   how much to take  how to take this  when to take this  reasons to take this  additional instructions   Oxycodone HCl 10 MG Tabs Take 1-1.5 tablets (10-15 mg total) by mouth every 4 (four) hours as needed for severe pain (pain score 7-10). What changed:   medication strength  how much to take  reasons to take this   pantoprazole 40 MG tablet Commonly known as: PROTONIX Take 1 tablet (40 mg total) by mouth daily.   polyethylene glycol 17 g packet Commonly known as: MIRALAX / GLYCOLAX Take 17 g by mouth 2 (two) times daily. What changed: when to take this   senna 8.6 MG Tabs tablet Commonly known as: SENOKOT Take 1 tablet by mouth 2 (two) times daily.   senna-docusate 8.6-50 MG tablet Commonly known as: Senokot-S Take 1 tablet by mouth 2 (two) times daily.   zinc sulfate 220 (50 Zn) MG capsule Take 1 capsule (220 mg total) by mouth daily.       Diagnostic Studies: DG Chest Port 1 View  Result Date: 02/21/2021 CLINICAL DATA:  Chest pain. EXAM: PORTABLE CHEST 1 VIEW COMPARISON:  02/02/2021 FINDINGS: Stable heart size and mediastinal contours. Mild aortic tortuosity. Minor bibasilar atelectasis. No confluent consolidation. No pleural effusion or pneumothorax. No acute osseous abnormalities are seen. IMPRESSION: Minor bibasilar atelectasis. Electronically Signed   By: 04/04/2021 M.D.   On: 02/21/2021 20:12   DG Foot Complete Left  Result Date: 02/21/2021 CLINICAL DATA:  Left foot swelling. Recent surgery. Rule out osteomyelitis. EXAM: LEFT FOOT - COMPLETE 3+ VIEW COMPARISON:  Preoperative imaging 02/02/2021 FINDINGS: Resection of the first ray at the proximal metatarsal, resection margin is smooth. Soft tissue edema throughout the operative bed with occasional foci of air. There is a small osseous  density about the medial aspect of the medial cuneiform that was not definitively seen on prior. No other erosive or destructive change. Mild hammertoe deformity of the digits. No radiopaque foreign body. Moderate plantar calcaneal spur and Achilles tendon enthesophyte. IMPRESSION: 1. Post resection of the first ray at the proximal metatarsal. Soft tissue edema throughout the operative bed with occasional foci of air. This may be due to postsurgical change or soft tissue infection. 2. Small osseous density about the medial aspect of the medial cuneiform, not definitively seen on prior, unclear if this is postsurgical or may represent early osteomyelitis. Electronically Signed   By: 04/04/2021 M.D.   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   Patient benefited maximally from their hospital stay and there were no complications.     Disposition: Discharge disposition: 03-Skilled Nursing Facility      Discharge Instructions    Call MD / Call 911   Complete by: As directed    If you experience chest pain or shortness of breath, CALL 911 and be transported to the hospital emergency room.  If you develope a fever above 101 F, pus (white drainage) or increased drainage or redness at the wound, or calf pain, call your surgeon's office.   Call MD / Call 911   Complete by: As directed    If you experience chest pain or shortness of breath, CALL 911 and be transported to the hospital emergency room.  If you develope a fever above 101 F, pus (white drainage) or increased drainage or redness at the wound, or calf pain, call your surgeon's office.   Constipation Prevention   Complete by: As directed    Drink plenty of fluids.  Prune juice may be helpful.  You may use a stool softener, such as Colace (over the counter) 100 mg twice a day.   Use MiraLax (over the counter) for constipation as needed.   Constipation Prevention   Complete by: As directed    Drink plenty of fluids.  Prune juice may be helpful.  You may use a stool softener, such as Colace (over the counter) 100 mg twice a day.  Use MiraLax (over the counter) for constipation as needed.   Diet - low sodium heart healthy   Complete by: As directed    Diet - low sodium heart healthy   Complete by: As directed    Discharge instructions   Complete by: As directed    Nonweight bearing. Please call office if vac alarms   Increase activity slowly as tolerated   Complete by: As directed    Increase activity slowly as tolerated   Complete by: As directed    Negative Pressure Wound Therapy - Incisional   Complete by: As directed    Show patient how to attach preveena vac. Should call office if alarms   Post-operative opioid taper instructions:   Complete by: As directed    POST-OPERATIVE OPIOID TAPER INSTRUCTIONS: It is important to wean off of your opioid medication as soon as possible. If you do not need pain medication after your surgery it is ok to stop day one. Opioids include: Codeine, Hydrocodone(Norco, Vicodin), Oxycodone(Percocet, oxycontin) and hydromorphone amongst others.  Long term and even short term use of opiods can cause: Increased pain response Dependence Constipation Depression Respiratory depression And more.  Withdrawal symptoms can include Flu like symptoms Nausea, vomiting And more Techniques to manage these symptoms Hydrate well Eat regular healthy meals Stay active Use relaxation techniques(deep breathing, meditating, yoga) Do Not substitute Alcohol to help with tapering If you have been on opioids for less than two weeks and do not have pain than it is ok to stop all together.  Plan to wean off of opioids This plan should start within one week post op of your joint replacement. Maintain the same interval or time between taking each  dose and first decrease the dose.  Cut the total daily intake of opioids by one tablet each day Next start to increase the time between doses. The last dose that should be eliminated is the evening  dose.      Post-operative opioid taper instructions:   Complete by: As directed    POST-OPERATIVE OPIOID TAPER INSTRUCTIONS: It is important to wean off of your opioid medication as soon as possible. If you do not need pain medication after your surgery it is ok to stop day one. Opioids include: Codeine, Hydrocodone(Norco, Vicodin), Oxycodone(Percocet, oxycontin) and hydromorphone amongst others.  Long term and even short term use of opiods can cause: Increased pain response Dependence Constipation Depression Respiratory depression And more.  Withdrawal symptoms can include Flu like symptoms Nausea, vomiting And more Techniques to manage these symptoms Hydrate well Eat regular healthy meals Stay active Use relaxation techniques(deep breathing, meditating, yoga) Do Not substitute Alcohol to help with tapering If you have been on opioids for less than two weeks and do not have pain than it is ok to stop all together.  Plan to wean off of opioids This plan should start within one week post op of your joint replacement. Maintain the same interval or time between taking each dose and first decrease the dose.  Cut the total daily intake of opioids by one tablet each day Next start to increase the time between doses. The last dose that should be eliminated is the evening dose.         Follow-up Information    Adonis Huguenin, NP In 1 week.   Specialty: Orthopedic Surgery Contact information: [address] [place] [address] [phone]                Signed: [name] 03/07/2021, 8:35 AM

## 2021-03-07 NOTE — Progress Notes (Signed)
PT Cancellation Note  Patient Details Name: Brett White MRN: [id number] DOB: 1960-11-18   Cancelled Treatment:    Reason Eval/Treat Not Completed: Other (comment). Pt reports he didn't sleep last night and he is returning to SNF today and deferred PT prior to DC.    Angelina Ok [facility] 03/07/2021, 11:13 AM Skip Mayer PT Acute Rehabilitation Services Pager [phone] Office [phone]

## 2021-03-07 NOTE — TOC Progression Note (Addendum)
Transition of Care [facility]) - Progression Note    Patient Details  Name: Brett White MRN: [id number] Date of Birth: Feb 01, 1961  [facility] CM/SW Contact  Jimmy Picket, [place] Phone Number: 03/07/2021, 12:24 PM  Clinical Narrative:     CSW received call from [facility] stating that pts insurance has denied SNF at this time but is offering a peer to peer with the MD. The number for the MD to call is [phone]. Pts reference number is [id number]. Pts Plan ID number is [id number].   Peer to peer needs to be completed by 11am on 03/08/21.   [facility] will hold pts bed until P2P is completed and decision is made by [facility].   3:30pm- [employer] denied pt for SNF. Pt states he has medicaid but does not have a card. CSW gave pt number to [facility] to call and ask for his mediciad case workers name and number to verify that pt has active medicaid.  4:30pm- Pt does not have medicaid  TOC will follow.   Expected Discharge Plan: Skilled Nursing Facility Barriers to Discharge: Continued Medical Work up  Expected Discharge Plan and Services Expected Discharge Plan: Skilled Nursing Facility       Living arrangements for the past 2 months: Single Family Home Expected Discharge Date: 03/07/21                                     Social Determinants of Health (SDOH) Interventions    Readmission Risk Interventions No flowsheet data found.  Jimmy Picket, Theresia Majors, [place] Clinical Social Worker [phone]

## 2021-03-07 NOTE — Progress Notes (Signed)
Pharmacy Antibiotic Note  Brett White is a 60 y.o. male admitted on 03/03/2021 with L foot abscess s/p debridement 6/3. Pharmacy has been consulted for vancomycin dosing.   Scr stable. Pending discharge to SNF. Discussed with team, will change to doxycycline per susceptibilities.   Plan: Vancomycin 1250mg  IV q12h - est AUC 459  Change to doxycycline at discharge   Height: 6\' 3"  (190.5 cm) Weight: 97.1 kg (214 lb 1.1 oz) IBW/kg (Calculated) : 84.5  Temp (24hrs), Avg:98.3 F (36.8 C), Min:97.9 F (36.6 C), Max:98.6 F (37 C)  Recent Labs  Lab 03/04/21 0114 03/05/21 0117  WBC 8.4 8.0  CREATININE 1.07 1.06    Estimated Creatinine Clearance: 91.9 mL/min (by C-G formula based on SCr of 1.06 mg/dL).    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  8/3, PharmD, BCPS, BCCP Clinical Pharmacist  Please check AMION for all [facility] Pharmacy phone numbers After 10:00 PM, call Main Pharmacy [phone]

## 2021-03-08 LAB — GLUCOSE, CAPILLARY
Glucose-Capillary: 137 mg/dL — ABNORMAL HIGH (ref 70–99)
Glucose-Capillary: 187 mg/dL — ABNORMAL HIGH (ref 70–99)
Glucose-Capillary: 135 mg/dL — ABNORMAL HIGH (ref 70–99)
Glucose-Capillary: 189 mg/dL — ABNORMAL HIGH (ref 70–99)

## 2021-03-08 LAB — AEROBIC/ANAEROBIC CULTURE W GRAM STAIN (SURGICAL/DEEP WOUND)

## 2021-03-08 MED ORDER — MUPIROCIN 2 % EX OINT
1.0000 "application " | TOPICAL_OINTMENT | Freq: Two times a day (BID) | CUTANEOUS | Status: AC
  Administered 2021-03-08 – 2021-03-12 (×10): 1 via NASAL
  Filled 2021-03-08: qty 22

## 2021-03-08 MED ORDER — DOXYCYCLINE HYCLATE 100 MG PO TABS
100.0000 mg | ORAL_TABLET | ORAL | Status: DC
  Administered 2021-03-08 – 2021-03-15 (×14): 100 mg via ORAL
  Filled 2021-03-08 (×14): qty 1

## 2021-03-08 MED ORDER — CHLORHEXIDINE GLUCONATE CLOTH 2 % EX PADS
6.0000 | MEDICATED_PAD | Freq: Every day | CUTANEOUS | Status: AC
  Administered 2021-03-08 – 2021-03-12 (×4): 6 via TOPICAL

## 2021-03-08 NOTE — Progress Notes (Addendum)
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 He was admitted for pain and swelling in operative foot. He is now s/p L foot debridement with wound vac. PMH includes:  DM II with neuropathy and prior back surgery.    PT Comments    Pt was seen for mobility training with RW, and still requires dense cues for mobility and use of walker to avoid WB on LLE.  Pt is not focused on the task always, tends to forget instructions quickly.  He is still requiring the same explanation of why he needs to maintain NWB.  Follow acutely for instructed walking, with expectation that he may not be able to go to SNF.   Follow Up Recommendations  SNF     Equipment Recommendations  None recommended by PT    Recommendations for Other Services       Precautions / Restrictions Precautions Precautions: Fall;Other (comment) Precaution Comments: NWB LLE, continues to WB despite reminders and education. Required Braces or Orthoses: Other Brace Other Brace: post op shoe, refuses to wear Restrictions Weight Bearing Restrictions: Yes LLE Weight Bearing: Non weight bearing    Mobility  Bed Mobility Overal bed mobility: Modified Independent             General bed mobility comments: requires supervised help due to cognition    Transfers Overall transfer level: Needs assistance Equipment used: Rolling walker (2 wheeled) Transfers: Sit to/from Stand Sit to Stand: Min guard         General transfer comment: NWB is a struggle with dense cues neeed  Ambulation/Gait Ambulation/Gait assistance: Min Chemical engineer (Feet): 20 Feet Assistive device: Rolling walker (2 wheeled)   Gait velocity: decreased   General Gait Details: Pt is in need of continual cues for management of NWB is difficult,  Pt tends to rest heel on the floor unless continually monitored and cued   Stairs             Wheelchair Mobility    Modified Rankin (Stroke Patients Only)       Balance     Sitting balance-Leahy Scale: Good       Standing balance-Leahy Scale: Poor                              Cognition Arousal/Alertness: Awake/alert Behavior During Therapy: Impulsive Overall Cognitive Status: History of cognitive impairments - at baseline Area of Impairment: Following commands;Safety/judgement;Awareness;Problem solving                   Current Attention Level: Selective Memory: Decreased recall of precautions;Decreased short-term memory Following Commands: Follows one step commands inconsistently;Follows one step commands with increased time Safety/Judgement: Decreased awareness of safety Awareness: Intellectual Problem Solving: Requires verbal cues;Requires tactile cues;Slow processing General Comments: multimodal cues for NWB at this time      Exercises General Exercises - Lower Extremity Ankle Circles/Pumps: AAROM;AROM;5 reps Quad Sets: AROM;10 reps    General Comments General comments (skin integrity, edema, etc.): max cues for all NWB      Pertinent Vitals/Pain Pain Assessment: 0-10 Pain Score: 10-Worst pain ever Pain Location: L foot Pain Descriptors / Indicators: Guarding;Grimacing Pain Intervention(s): Limited activity within patient's tolerance;Monitored during session;Premedicated before session;Repositioned    Home Living  Prior Function            PT Goals (current goals can now be found in the care plan section) Acute Rehab PT Goals Patient Stated Goal: go to rehab and then home Progress towards PT goals: Not progressing toward goals - comment    Frequency    Min 3X/week      PT Plan Current plan remains appropriate    Co-evaluation              AM-PAC PT "6 Clicks" Mobility    Outcome Measure  Help needed turning from your back to your side while in a flat bed without using bedrails?: None Help needed moving from lying on your back to sitting on the side of a flat bed without using bedrails?: A Little Help needed moving to and from a bed to a chair (including a wheelchair)?: A Little Help needed standing up from a chair using your arms (e.g., wheelchair or bedside chair)?: A Little Help needed to walk in hospital room?: A Little Help needed climbing 3-5 steps with a railing? : A Lot 6 Click Score: 18    End of Session Equipment Utilized During Treatment: Gait belt Activity Tolerance: Patient limited by fatigue;Patient limited by pain Patient left: in bed;with call bell/phone within reach;with bed alarm set Nurse Communication: Mobility status PT Visit Diagnosis: Unsteadiness on feet (R26.81);Difficulty in walking, not elsewhere classified (R26.2);Pain Pain - Right/Left: Left Pain - part of body: Ankle and joints of foot     Time: 1450-1515 PT Time Calculation (min) (ACUTE ONLY): 25 min  Charges:  $Gait Training: 8-22 mins $Therapeutic Exercise: 8-22 mins                     Ivar Drape 03/08/2021, 8:55 PM  Samul Dada, PT MS Acute Rehab Dept. Number: [facility] [id number] and [facility] [phone]

## 2021-03-08 NOTE — TOC Progression Note (Signed)
Transition of Care [facility]) - Progression Note    Patient Details  Name: Brett White MRN: [id number] Date of Birth: 04-27-1961  [facility] CM/SW Contact  Jimmy Picket, [place] Phone Number: 03/08/2021, 1:57 PM  Clinical Narrative:     CSW spoke to pt on phone. Pt inquired about private pay to [facility]. CSW informed pt it will be $9,150 for 30 days. Pt reports he cannot afford that. Pt stated he wanted to appeal [employer] decision. CSW gave pt number [phone] to start an expedited appeal.   CSW and pt discussed plan B. Pt reports he would be willing to pay for a hotel but he does not get his next SSI check until the 4th Thursday of the month.   TOC will continue to work with pt about DC plan.   Expected Discharge Plan: Skilled Nursing Facility Barriers to Discharge: Continued Medical Work up  Expected Discharge Plan and Services Expected Discharge Plan: Skilled Nursing Facility       Living arrangements for the past 2 months: Single Family Home Expected Discharge Date: 03/07/21                                     Social Determinants of Health (SDOH) Interventions    Readmission Risk Interventions No flowsheet data found.  Jimmy Picket, Theresia Majors, [place] Clinical Social Worker [phone]

## 2021-03-08 NOTE — Progress Notes (Signed)
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 He was admitted for pain and swelling in operative foot. He is now s/p L foot debridement with wound vac. PMH includes:  DM II with neuropathy and prior back surgery.   OT comments  Pt progressing with OT goals this session. Due to reported short term memory deficits and decreased safety awareness, pt needs max verbal cues/reminders to maintain NWB status. Pt min guard for seated activities, requiring min A for transfers in bathroom. At this time pt is not safe to DC home due to decreased safety awareness, difficulty maintaining NWB on LLE, and pain levels with functional mobility. Acute OT will continue following up to assist pt in progressing his goals.    Follow Up Recommendations  SNF    Equipment Recommendations  Tub/shower bench;3 in 1 bedside commode    Recommendations for Other Services      Precautions / Restrictions Precautions Precautions: Fall;Other (comment) Precaution Comments: NWB LLE, continues to WB despite reminders and education. Required Braces or Orthoses: Other Brace (Post op shoe) Other Brace: post op shoe, refuses to wear Restrictions Weight Bearing Restrictions: Yes LLE Weight Bearing: Non weight bearing       Mobility Bed Mobility Overal bed mobility: Modified Independent             General bed mobility comments: safety observation    Transfers Overall transfer level: Needs assistance Equipment used: Rolling walker (2 wheeled) Transfers: Sit to/from Stand Sit to Stand: Min guard         General transfer comment: NWB cues and reminders for maintaining hand placement safely    Balance Overall balance assessment: Needs assistance Sitting-balance support: Feet supported Sitting  balance-Leahy Scale: Good     Standing balance support: Bilateral upper extremity supported;During functional activity Standing balance-Leahy Scale: Poor Standing balance comment: BUE essential for NWB and balance                           ADL either performed or assessed with clinical judgement   ADL Overall ADL's : Needs assistance/impaired     Grooming: Wash/dry hands;Wash/dry face;Min guard;Standing Grooming Details (indicate cue type and reason): completed standing at sink, reminders and tactile cueing for NWB                 Toilet Transfer: Minimal assistance;Ambulation;Cueing for safety;Cueing for sequencing Toilet Transfer Details (indicate cue type and reason): multimodal cues for WB status, assist for boost Toileting- Clothing Manipulation and Hygiene: Min guard;Cueing for safety;Cueing for sequencing       Functional mobility during ADLs: Min guard;Cueing for safety;Cueing for sequencing General ADL Comments: decreased safety awareness, poor recall of precautions (was TDWB last time, now NWB) line management with wound vac. much safer in sitting position     Vision       Perception     Praxis      Cognition Arousal/Alertness: Awake/alert Behavior During Therapy: WFL for tasks assessed/performed Overall Cognitive Status: History of cognitive impairments - at baseline                           Safety/Judgement: Decreased awareness of safety;Decreased awareness of deficits     General Comments: multimodal cues for NWB at this  time        Exercises     Shoulder Instructions       General Comments Pt requiring max verbal and tactile cues for NWB in LLE. At this time pt is not safe to d/c home due to balance concerns and decreased safety awareness.    Pertinent Vitals/ Pain       Pain Assessment: 0-10 Pain Score: 10-Worst pain ever Pain Location: L foot Pain Descriptors / Indicators: Aching;Grimacing Pain Intervention(s):  Monitored during session;Repositioned  Home Living                                          Prior Functioning/Environment              Frequency  Min 2X/week        Progress Toward Goals  OT Goals(current goals can now be found in the care plan section)  Progress towards OT goals: Progressing toward goals  Acute Rehab OT Goals Patient Stated Goal: go to rehab and then home OT Goal Formulation: With patient Time For Goal Achievement: 03/18/21 Potential to Achieve Goals: Good ADL Goals Pt Will Perform Grooming: with set-up;sitting Pt Will Perform Lower Body Bathing: with set-up;sitting/lateral leans Pt Will Perform Lower Body Dressing: with supervision;sit to/from stand Pt Will Transfer to Toilet: with supervision;ambulating Pt Will Perform Toileting - Clothing Manipulation and hygiene: with modified independence;sitting/lateral leans Pt Will Perform Tub/Shower Transfer: with modified independence;tub bench  Plan Discharge plan remains appropriate;Frequency remains appropriate    Co-evaluation                 AM-PAC OT "6 Clicks" Daily Activity     Outcome Measure   Help from another person eating meals?: None Help from another person taking care of personal grooming?: A Little Help from another person toileting, which includes using toliet, bedpan, or urinal?: A Little Help from another person bathing (including washing, rinsing, drying)?: A Little Help from another person to put on and taking off regular upper body clothing?: A Little Help from another person to put on and taking off regular lower body clothing?: A Little 6 Click Score: 19    End of Session Equipment Utilized During Treatment: Gait belt;Rolling walker  OT Visit Diagnosis: Unsteadiness on feet (R26.81);Other abnormalities of gait and mobility (R26.89);Pain Pain - Right/Left: Left Pain - part of body: Ankle and joints of foot   Activity Tolerance Patient tolerated  treatment well   Patient Left in bed;with call bell/phone within reach;with bed alarm set   Nurse Communication Mobility status;Weight bearing status        Time: 8786-7672 OT Time Calculation (min): 23 min  Charges: OT General Charges $OT Visit: 1 Visit OT Treatments $Self Care/Home Management : 23-37 mins  Miciah Covelli H., OTR/L Acute Rehabilitation   Danetta Prom Elane Nyjae Hodge 03/08/2021, 10:40 AM

## 2021-03-08 NOTE — Plan of Care (Signed)
  Problem: Education: Goal: Required Educational Video(s) Outcome: Progressing   Problem: Clinical Measurements: Goal: Postoperative complications will be avoided or minimized Outcome: Progressing   Problem: Skin Integrity: Goal: Demonstration of wound healing without infection will improve Outcome: Progressing   Problem: Education: Goal: Knowledge of General Education information will improve Description: Including pain rating scale, medication(s)/side effects and non-pharmacologic comfort measures Outcome: Progressing   Problem: Health Behavior/Discharge Planning: Goal: Ability to manage health-related needs will improve Outcome: Progressing   Problem: Clinical Measurements: Goal: Ability to maintain clinical measurements within normal limits will improve Outcome: Progressing Goal: Will remain free from infection Outcome: Progressing Goal: Diagnostic test results will improve Outcome: Progressing Goal: Respiratory complications will improve Outcome: Progressing Goal: Cardiovascular complication will be avoided Outcome: Progressing   Problem: Activity: Goal: Risk for activity intolerance will decrease Outcome: Progressing   Problem: Nutrition: Goal: Adequate nutrition will be maintained Outcome: Progressing   Problem: Coping: Goal: Level of anxiety will decrease Outcome: Progressing   Problem: Elimination: Goal: Will not experience complications related to bowel motility Outcome: Progressing Goal: Will not experience complications related to urinary retention Outcome: Progressing   Problem: Pain Managment: Goal: General experience of comfort will improve Outcome: Progressing   Problem: Safety: Goal: Ability to remain free from injury will improve Outcome: Progressing   Problem: Skin Integrity: Goal: Risk for impaired skin integrity will decrease Outcome: Progressing   

## 2021-03-08 NOTE — Progress Notes (Signed)
Is postop day 5 status post irrigation and debridement left foot.  Wound VAC is in place and functioning.  Patient appears more comfortable.  Dr. Lajoyce Corners did a peer to peer with patient's insurance company yesterday afternoon and unfortunately skilled nursing was still denied.  Patient was informed of this by me today.  He wants to try and pay for a nursing facility for 1 month.  He asked what that would cost and I told him I would defer to transition of care to see if they can help him with that.  If unable will discharge to home.  Will await patient's decision after speaking with transition of care

## 2021-03-09 LAB — GLUCOSE, CAPILLARY
Glucose-Capillary: 120 mg/dL — ABNORMAL HIGH (ref 70–99)
Glucose-Capillary: 153 mg/dL — ABNORMAL HIGH (ref 70–99)
Glucose-Capillary: 186 mg/dL — ABNORMAL HIGH (ref 70–99)
Glucose-Capillary: 188 mg/dL — ABNORMAL HIGH (ref 70–99)

## 2021-03-09 NOTE — Progress Notes (Signed)
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 He was admitted for pain and swelling in operative foot. He is now s/p L foot debridement with wound vac. PMH includes:  DM II with neuropathy and prior back surgery.    PT Comments    Pt is making good progress with mobility, ambulating x2 bouts of ~20-30 ft each bout with a RW and min guard assist. Pt also with improved recall and carryover of his NWB precautions, but continues to need intermittent cues to maintain. Pt continues to have poor safety awareness also. Will continue to follow acutely. Current recommendations remain appropriate.    Follow Up Recommendations  SNF     Equipment Recommendations  None recommended by PT    Recommendations for Other Services       Precautions / Restrictions Precautions Precautions: Fall;Other (comment) Precaution Comments: NWB LLE, continues to WB despite reminders and education. Required Braces or Orthoses: Other Brace Other Brace: post op shoe, refuses to wear Restrictions Weight Bearing Restrictions: Yes LLE Weight Bearing: Non weight bearing    Mobility  Bed Mobility Overal bed mobility: Modified Independent Bed Mobility: Supine to Sit;Sit to Supine     Supine to sit: Modified independent (Device/Increase time) Sit to supine: Modified independent (Device/Increase time)   General bed mobility comments: Mod I for bed mobility.    Transfers Overall transfer level: Needs assistance Equipment used: Rolling walker (2 wheeled) Transfers: Sit to/from Stand Sit to Stand: Min guard         General transfer comment: Cues to extend L knee prior to transfer to stand or to sit to maintain NWB, min guard for safety.  Ambulation/Gait Ambulation/Gait assistance: Min guard Gait Distance  (Feet): 30 Feet (x2 bouts of ~30 ft > ~20 ft) Assistive device: Rolling walker (2 wheeled) Gait Pattern/deviations:  (hop-to) Gait velocity: decreased Gait velocity interpretation: <1.31 ft/sec, indicative of household ambulator General Gait Details: Pt tends to rest heel on the floor intermittently, needing cues to avoid and maintain NWB, but overall better carryover of NWB this date. Pt with short hop-to with R leg, cues to increase foot clearance and look superiorly rather than at feet. Min guard for safety, no LOB.   Stairs             Wheelchair Mobility    Modified Rankin (Stroke Patients Only)       Balance Overall balance assessment: Needs assistance Sitting-balance support: Feet supported Sitting balance-Leahy Scale: Good     Standing balance support: Bilateral upper extremity supported;During functional activity Standing balance-Leahy Scale: Poor Standing balance comment: BUE essential for NWB and balance                            Cognition Arousal/Alertness: Awake/alert Behavior During Therapy: Impulsive Overall Cognitive Status: History of cognitive impairments - at baseline Area of Impairment: Following commands;Safety/judgement;Awareness;Problem solving;Attention;Memory                   Current Attention Level: Sustained Memory: Decreased recall of precautions;Decreased short-term memory Following Commands: Follows one step commands consistently;Follows multi-step commands inconsistently Safety/Judgement: Decreased awareness of safety Awareness: Emergent Problem Solving: Requires verbal cues;Requires tactile cues;Slow processing General Comments: Pt with improved carryover of NWB on L leg, but still needs cues at times as  he still places heel on ground as he fatigues. Pt following single step commands consistently, needing increased time to follow multi-step commands. Pt with poor safety awareness, reporting he walked around his room  earlier alone but admitted to knowing he should have not done it without supervision from nursing.      Exercises General Exercises - Lower Extremity Ankle Circles/Pumps: Both;Other reps (comment);Supine;AROM (x3 reps) Quad Sets: AROM;Other reps (comment);Supine;Left (x2 reps) Heel Slides: Left;Other reps (comment);Supine;AROM (x2 reps) Straight Leg Raises: Left;Other reps (comment);Supine;AROM (x1 rep) Other Exercises Other Exercises: educated patient to perform LE exercises to include heel slides, ankle pumps, quad sets, and SLR while in bed    General Comments        Pertinent Vitals/Pain Pain Assessment: Faces Faces Pain Scale: Hurts whole lot Pain Location: L foot Pain Descriptors / Indicators: Guarding;Grimacing;Crying Pain Intervention(s): Limited activity within patient's tolerance;Monitored during session;Repositioned;RN gave pain meds during session    Home Living                      Prior Function            PT Goals (current goals can now be found in the care plan section) Acute Rehab PT Goals Patient Stated Goal: to go to rehab PT Goal Formulation: With patient Time For Goal Achievement: 03/18/21 Potential to Achieve Goals: Good Progress towards PT goals: Progressing toward goals    Frequency    Min 3X/week      PT Plan Current plan remains appropriate    Co-evaluation              AM-PAC PT "6 Clicks" Mobility   Outcome Measure  Help needed turning from your back to your side while in a flat bed without using bedrails?: None Help needed moving from lying on your back to sitting on the side of a flat bed without using bedrails?: None Help needed moving to and from a bed to a chair (including a wheelchair)?: A Little Help needed standing up from a chair using your arms (e.g., wheelchair or bedside chair)?: A Little Help needed to walk in hospital room?: A Little Help needed climbing 3-5 steps with a railing? : A Lot 6 Click Score:  19    End of Session Equipment Utilized During Treatment: Gait belt Activity Tolerance: Patient limited by fatigue;Patient limited by pain Patient left: in bed;with call bell/phone within reach;with bed alarm set Nurse Communication: Mobility status PT Visit Diagnosis: Unsteadiness on feet (R26.81);Difficulty in walking, not elsewhere classified (R26.2);Pain;Other abnormalities of gait and mobility (R26.89) Pain - Right/Left: Left Pain - part of body: Ankle and joints of foot     Time: 7858-8502 PT Time Calculation (min) (ACUTE ONLY): 19 min  Charges:  $Gait Training: 8-22 mins                     Raymond Gurney, PT, DPT Acute Rehabilitation Services  Pager: [phone] Office: [phone]    Jewel Baize 03/09/2021, 5:04 PM

## 2021-03-09 NOTE — Plan of Care (Signed)
  Problem: Education: Goal: Required Educational Video(s) Outcome: Progressing   Problem: Clinical Measurements: Goal: Postoperative complications will be avoided or minimized Outcome: Progressing   Problem: Skin Integrity: Goal: Demonstration of wound healing without infection will improve Outcome: Progressing   Problem: Education: Goal: Knowledge of General Education information will improve Description: Including pain rating scale, medication(s)/side effects and non-pharmacologic comfort measures Outcome: Progressing   Problem: Health Behavior/Discharge Planning: Goal: Ability to manage health-related needs will improve Outcome: Progressing   Problem: Clinical Measurements: Goal: Ability to maintain clinical measurements within normal limits will improve Outcome: Progressing Goal: Will remain free from infection Outcome: Progressing Goal: Diagnostic test results will improve Outcome: Progressing Goal: Respiratory complications will improve Outcome: Progressing Goal: Cardiovascular complication will be avoided Outcome: Progressing   Problem: Activity: Goal: Risk for activity intolerance will decrease Outcome: Progressing   Problem: Nutrition: Goal: Adequate nutrition will be maintained Outcome: Progressing   Problem: Coping: Goal: Level of anxiety will decrease Outcome: Progressing   Problem: Elimination: Goal: Will not experience complications related to bowel motility Outcome: Progressing Goal: Will not experience complications related to urinary retention Outcome: Progressing   Problem: Pain Managment: Goal: General experience of comfort will improve Outcome: Progressing   Problem: Safety: Goal: Ability to remain free from injury will improve Outcome: Progressing   Problem: Skin Integrity: Goal: Risk for impaired skin integrity will decrease Outcome: Progressing   

## 2021-03-09 NOTE — Progress Notes (Signed)
Patient is postop day 6.  He is complaining of increased pain for the last couple hours.  Vital signs stable afebrile wound VAC is functioning with Prevena wound VAC in place and plugged in.  Examination of the foot there is no ascending cellulitis no swelling compartments are soft and compressible Disposition still a problem.  Patient also has had difficulty complying with weightbearing restrictions continue doxycycline

## 2021-03-10 DIAGNOSIS — L02612 Cutaneous abscess of left foot: Secondary | ICD-10-CM | POA: Diagnosis present

## 2021-03-10 DIAGNOSIS — Z20822 Contact with and (suspected) exposure to covid-19: Secondary | ICD-10-CM | POA: Diagnosis present

## 2021-03-10 DIAGNOSIS — T8141XA Infection following a procedure, superficial incisional surgical site, initial encounter: Secondary | ICD-10-CM | POA: Diagnosis present

## 2021-03-10 DIAGNOSIS — Y839 Surgical procedure, unspecified as the cause of abnormal reaction of the patient, or of later complication, without mention of misadventure at the time of the procedure: Secondary | ICD-10-CM | POA: Diagnosis present

## 2021-03-10 DIAGNOSIS — Z89422 Acquired absence of other left toe(s): Secondary | ICD-10-CM | POA: Diagnosis not present

## 2021-03-10 LAB — GLUCOSE, CAPILLARY
Glucose-Capillary: 112 mg/dL — ABNORMAL HIGH (ref 70–99)
Glucose-Capillary: 177 mg/dL — ABNORMAL HIGH (ref 70–99)
Glucose-Capillary: 162 mg/dL — ABNORMAL HIGH (ref 70–99)
Glucose-Capillary: 149 mg/dL — ABNORMAL HIGH (ref 70–99)

## 2021-03-10 NOTE — TOC Progression Note (Signed)
Transition of Care [facility]) - Progression Note    Patient Details  Name: Brett White MRN: [id number] Date of Birth: 1961-10-01  Transition of Care [facility]) CM/SW Contact  Jimmy Picket, [place] Phone Number: 03/10/2021, 10:47 AM  Clinical Narrative:     CW spoke to pt about DC planning. Pt reports he started the appeal but has not heard anything back.   Pt reports he has been talking to his friend, and he has agreed for him to stary in his home. Pts friend is trying to get a bed for the room. Pt does not have address but says he will get it by the end of the day. Pt is interested in HHPT, no preference on facility.   Expected Discharge Plan: Skilled Nursing Facility Barriers to Discharge: Continued Medical Work up  Expected Discharge Plan and Services Expected Discharge Plan: Skilled Nursing Facility       Living arrangements for the past 2 months: Single Family Home Expected Discharge Date: 03/07/21                                     Social Determinants of Health (SDOH) Interventions    Readmission Risk Interventions No flowsheet data found.  Jimmy Picket, Theresia Majors, [place] Clinical Social Worker [phone]

## 2021-03-10 NOTE — Care Management (Addendum)
Patient can stay with best friend's sister  at : [address] , [place] [address] on Tuesday.  Cell phone [phone]   PCP Dr Loura Back  , at [facility] , called they will call patient March 15, 2021 and schedule follow up appointment   Kandee Keen with [facility] can accept for PT/OT , will need orders and face to face.   Need need recommendations on DME   Received orders for HHPT/OT walker and 3 in 1 ordered with [employer]   Ronny Flurry RN

## 2021-03-10 NOTE — Congregational Nurse Program (Addendum)
Is postop day 8 status post left first ray amputation.  His only complaint is of continued pain.  Vital signs stable afebrile wound VAC is alarming with leakage indicator.  Blood is all clotted in the canister.  Well apposed wound edges on the wound.  Sutures in place small amount of bloody drainage.  No erythema no cellulitis swelling is well controlled  Wound VAC was removed.  Patient difficult placement.  He is trying to contact a friend to see if he can stay with them.  Continue to reinforce weightbearing restrictions currently on doxycycline per pharmacy recommendation based on sensitivities and patient's medications.

## 2021-03-10 NOTE — Plan of Care (Signed)
  Problem: Education: Goal: Required Educational Video(s) Outcome: Progressing   Problem: Clinical Measurements: Goal: Postoperative complications will be avoided or minimized Outcome: Progressing   Problem: Skin Integrity: Goal: Demonstration of wound healing without infection will improve Outcome: Progressing   Problem: Education: Goal: Knowledge of General Education information will improve Description: Including pain rating scale, medication(s)/side effects and non-pharmacologic comfort measures Outcome: Progressing   Problem: Health Behavior/Discharge Planning: Goal: Ability to manage health-related needs will improve Outcome: Progressing   Problem: Clinical Measurements: Goal: Ability to maintain clinical measurements within normal limits will improve Outcome: Progressing Goal: Will remain free from infection Outcome: Progressing Goal: Diagnostic test results will improve Outcome: Progressing Goal: Respiratory complications will improve Outcome: Progressing Goal: Cardiovascular complication will be avoided Outcome: Progressing   Problem: Activity: Goal: Risk for activity intolerance will decrease Outcome: Progressing   Problem: Nutrition: Goal: Adequate nutrition will be maintained Outcome: Progressing   Problem: Coping: Goal: Level of anxiety will decrease Outcome: Progressing   Problem: Elimination: Goal: Will not experience complications related to bowel motility Outcome: Progressing Goal: Will not experience complications related to urinary retention Outcome: Progressing   Problem: Pain Managment: Goal: General experience of comfort will improve Outcome: Progressing   Problem: Safety: Goal: Ability to remain free from injury will improve Outcome: Progressing   Problem: Skin Integrity: Goal: Risk for impaired skin integrity will decrease Outcome: Progressing   

## 2021-03-11 LAB — GLUCOSE, CAPILLARY
Glucose-Capillary: 121 mg/dL — ABNORMAL HIGH (ref 70–99)
Glucose-Capillary: 91 mg/dL (ref 70–99)
Glucose-Capillary: 190 mg/dL — ABNORMAL HIGH (ref 70–99)
Glucose-Capillary: 144 mg/dL — ABNORMAL HIGH (ref 70–99)

## 2021-03-11 NOTE — TOC Transition Note (Addendum)
Transition of Care [facility]) - CM/SW Discharge Note   Patient Details  Name: Herby Amick MRN: [id number] Date of Birth: 21-Feb-1961  Transition of Care [facility]) CM/SW Contact:  Bess Kinds, RN Phone Number: [phone] 03/11/2021, 1:57 PM   Clinical Narrative:     Update: Correction - patient not discharging today d/t still making arrangements for safe discharge home with friend.    Final next level of care: Home w Home Health Services Barriers to Discharge: No Barriers Identified   Patient Goals and CMS Choice Patient states their goals for this hospitalization and ongoing recovery are:: TO get better CMS Medicare.gov Compare Post Acute Care list provided to:: Patient Choice offered to / list presented to : Patient  Discharge Placement                       Discharge Plan and Services                          HH Arranged: PT, OT [facility] Agency: [facility] Date [facility] Agency Contacted: 03/11/21 Time HH Agency Contacted: [phone] Representative spoke with at [facility] Agency: Kandee Keen  Social Determinants of Health (SDOH) Interventions     Readmission Risk Interventions No flowsheet data found.

## 2021-03-11 NOTE — Progress Notes (Signed)
Occupational Therapy Treatment Patient Details Name: [employer] MRN: [id number] DOB: August 22, 1961 Today's Date: 03/11/2021    History of present illness 60 year old M admitted 03/03/21 who is 3 weeks status post left foot first ray amputation (02/10/21). He was admitted for pain and swelling in operative foot. He is now s/p L foot debridement with wound vac. PMH includes:  DM II with neuropathy and prior back surgery.   OT comments  Kruze is progressing well, he continues to require verbal and tactile cues to maintain NWB status when transitioning from sit>stand, however maintained NWB when ambulating short distance from bed>chair with rw given close min guard for safety. Jame's post-op shoe was not located in the room at this time. Pt was able to indep recall WB precautions, however still demonstrated lack of insight into his deficits, impulsivity with mobility and decreased safety awareness. Pt would benefit from continued OT acutely to maximize safety and indep in all ADLs and mobility. D/c rec remains appropriate, however per chart notes, pt is with plans to d/c to a friends house Tuesday/Wednesday.    Follow Up Recommendations  SNF    Equipment Recommendations  Tub/shower bench;3 in 1 bedside commode       Precautions / Restrictions Precautions Precautions: Fall;Other (comment) Precaution Comments: NWB LLE, continues to WB despite reminders and education. Required Braces or Orthoses: Other Brace Other Brace: post op shoe, could not locate in room Restrictions Weight Bearing Restrictions: Yes LLE Weight Bearing: Non weight bearing       Mobility Bed Mobility Overal bed mobility: Modified Independent Bed Mobility: Supine to Sit       Sit to supine: Modified independent (Device/Increase time)        Transfers Overall transfer level: Needs assistance Equipment used: Rolling walker (2 wheeled) Transfers: Sit to/from Stand Sit to Stand: Min guard         General  transfer comment: cues and physical assistance to extend LLE prior to standing to maintain WB status, however still placed slight weight through heel when standing; maintains NWB well when ambulating    Balance Overall balance assessment: Needs assistance Sitting-balance support: Feet supported Sitting balance-Leahy Scale: Good     Standing balance support: Bilateral upper extremity supported;During functional activity Standing balance-Leahy Scale: Poor Standing balance comment: BUE essential for NWB and balance                           ADL either performed or assessed with clinical judgement   ADL Overall ADL's : Needs assistance/impaired Eating/Feeding: Set up;Sitting   Grooming: Oral care;Set up Grooming Details (indicate cue type and reason): pt set up for oral care             Lower Body Dressing: Set up;Sitting/lateral leans Lower Body Dressing Details (indicate cue type and reason): pt donned R sock; unable to locate post-op shoe in room at this time             Functional mobility during ADLs: Min guard;Cueing for safety;Cueing for sequencing General ADL Comments: Impulsive, decrased safety awareness adn insight into deficits - requires tactile and vc to maintain NWB when transitioning sit>stand      Cognition Arousal/Alertness: Awake/alert Behavior During Therapy: Impulsive Overall Cognitive Status: History of cognitive impairments - at baseline Area of Impairment: Following commands;Safety/judgement;Awareness;Problem solving;Attention;Memory                   Current Attention Level: Sustained Memory: Decreased recall of  precautions;Decreased short-term memory Following Commands: Follows one step commands consistently;Follows multi-step commands inconsistently Safety/Judgement: Decreased awareness of safety Awareness: Emergent Problem Solving: Requires verbal cues;Requires tactile cues;Slow processing General Comments: Pt recalled all  precautions; continues to demonstrated impulsivity with movement        Exercises     Shoulder Instructions       General Comments wound vac discontinued, pt with flat affect this session, impulsive with movement    Pertinent Vitals/ Pain       Pain Assessment: Faces Faces Pain Scale: Hurts whole lot Pain Location: L foot Pain Descriptors / Indicators: Discomfort Pain Intervention(s): Limited activity within patient's tolerance;Monitored during session         Frequency  Min 2X/week        Progress Toward Goals  OT Goals(current goals can now be found in the care plan section)  Progress towards OT goals: Progressing toward goals  Acute Rehab OT Goals Patient Stated Goal: to go to rehab OT Goal Formulation: With patient Time For Goal Achievement: 03/18/21 Potential to Achieve Goals: Good ADL Goals Pt Will Perform Grooming: with set-up;sitting Pt Will Perform Lower Body Bathing: with set-up;sitting/lateral leans Pt Will Perform Lower Body Dressing: with supervision;sit to/from stand Pt Will Transfer to Toilet: with supervision;ambulating Pt Will Perform Toileting - Clothing Manipulation and hygiene: with modified independence;sitting/lateral leans Pt Will Perform Tub/Shower Transfer: with modified independence;tub bench  Plan Discharge plan remains appropriate;Frequency remains appropriate    Co-evaluation                 AM-PAC OT "6 Clicks" Daily Activity     Outcome Measure   Help from another person eating meals?: None Help from another person taking care of personal grooming?: A Little Help from another person toileting, which includes using toliet, bedpan, or urinal?: A Little Help from another person bathing (including washing, rinsing, drying)?: A Little Help from another person to put on and taking off regular upper body clothing?: A Little Help from another person to put on and taking off regular lower body clothing?: A Little 6 Click Score:  19    End of Session    OT Visit Diagnosis: Unsteadiness on feet (R26.81);Other abnormalities of gait and mobility (R26.89);Pain Pain - Right/Left: Left Pain - part of body: Ankle and joints of foot                 Time: 3382-5053 OT Time Calculation (min): 13 min  Charges: OT General Charges $OT Visit: 1 Visit OT Treatments $Therapeutic Activity: 8-22 mins     Melburn Treiber A Chloie Loney 03/11/2021, 3:52 PM

## 2021-03-11 NOTE — Plan of Care (Signed)
  Problem: Education: Goal: Required Educational Video(s) Outcome: Progressing   Problem: Clinical Measurements: Goal: Postoperative complications will be avoided or minimized Outcome: Progressing   Problem: Skin Integrity: Goal: Demonstration of wound healing without infection will improve Outcome: Progressing   Problem: Education: Goal: Knowledge of General Education information will improve Description: Including pain rating scale, medication(s)/side effects and non-pharmacologic comfort measures Outcome: Progressing   Problem: Health Behavior/Discharge Planning: Goal: Ability to manage health-related needs will improve Outcome: Progressing   Problem: Clinical Measurements: Goal: Ability to maintain clinical measurements within normal limits will improve Outcome: Progressing Goal: Will remain free from infection Outcome: Progressing Goal: Diagnostic test results will improve Outcome: Progressing Goal: Respiratory complications will improve Outcome: Progressing Goal: Cardiovascular complication will be avoided Outcome: Progressing   Problem: Activity: Goal: Risk for activity intolerance will decrease Outcome: Progressing   Problem: Nutrition: Goal: Adequate nutrition will be maintained Outcome: Progressing   Problem: Coping: Goal: Level of anxiety will decrease Outcome: Progressing   Problem: Elimination: Goal: Will not experience complications related to bowel motility Outcome: Progressing Goal: Will not experience complications related to urinary retention Outcome: Progressing   Problem: Pain Managment: Goal: General experience of comfort will improve Outcome: Progressing   Problem: Safety: Goal: Ability to remain free from injury will improve Outcome: Progressing   Problem: Skin Integrity: Goal: Risk for impaired skin integrity will decrease Outcome: Progressing   

## 2021-03-11 NOTE — Progress Notes (Signed)
Patient ID: Brett White, male   DOB: 1960/10/11, 60 y.o.   MRN: [id number] Patient is a 60 year old gentleman status post left foot first ray amputation.  The dressing is removed the incision is healing nicely there is no redness no cellulitis no drainage there is no swelling no signs of infection.  Patient states he does have a place for discharge on Wednesday plan for discharge to independent living on Wednesday.

## 2021-03-12 ENCOUNTER — Encounter: Payer: Self-pay | Admitting: Orthopedic Surgery

## 2021-03-12 LAB — GLUCOSE, CAPILLARY
Glucose-Capillary: 149 mg/dL — ABNORMAL HIGH (ref 70–99)
Glucose-Capillary: 183 mg/dL — ABNORMAL HIGH (ref 70–99)
Glucose-Capillary: 137 mg/dL — ABNORMAL HIGH (ref 70–99)
Glucose-Capillary: 201 mg/dL — ABNORMAL HIGH (ref 70–99)

## 2021-03-12 NOTE — Progress Notes (Signed)
Subjective: 9 Days Post-Op Procedure(s) (LRB): LEFT FOOT DEBRIDEMENT (Left) Patient reports pain as moderate.    Objective: Vital signs in last 24 hours: Temp:  [98 F (36.7 C)-98.3 F (36.8 C)] 98 F (36.7 C) (06/12 0400) Pulse Rate:  [76-81] 79 (06/12 0400) Resp:  [17] 17 (06/12 0400) BP: (127-144)/(78-85) 127/78 (06/12 0400) SpO2:  [95 %-96 %] 96 % (06/12 0400)  Intake/Output from previous day: 06/11 0701 - 06/12 0700 In: 1090 [P.O.:1090] Out: 1550 [Urine:1550] Intake/Output this shift: No intake/output data recorded.  No results for input(s): HGB in the last 72 hours. No results for input(s): WBC, RBC, HCT, PLT in the last 72 hours. No results for input(s): NA, K, CL, CO2, BUN, CREATININE, GLUCOSE, CALCIUM in the last 72 hours. No results for input(s): LABPT, INR in the last 72 hours.  Compartment soft   Assessment/Plan: 9 Days Post-Op Procedure(s) (LRB): LEFT FOOT DEBRIDEMENT (Left) Up with therapy Discharge home with home health Wednesday  NWB LLE Continue with daily dressing changes       Cristie Hem 03/12/2021, 7:12 AM

## 2021-03-13 LAB — GLUCOSE, CAPILLARY
Glucose-Capillary: 147 mg/dL — ABNORMAL HIGH (ref 70–99)
Glucose-Capillary: 139 mg/dL — ABNORMAL HIGH (ref 70–99)
Glucose-Capillary: 196 mg/dL — ABNORMAL HIGH (ref 70–99)
Glucose-Capillary: 177 mg/dL — ABNORMAL HIGH (ref 70–99)

## 2021-03-13 NOTE — Progress Notes (Signed)
Patient ID: Brett White, male   DOB: Jan 10, 1961, 60 y.o.   MRN: [id number] Patient is a 60 year old gentleman who is status post debridement abscess left foot.  Patient complains of pain and swelling.  The dressing was removed the skin is wrinkling well there is no redness no cellulitis no drainage there is good epithelization of the surgical incision.  Plan for discharge Wednesday.

## 2021-03-13 NOTE — Progress Notes (Signed)
Physical Therapy Treatment Patient Details Name: Brett White MRN: [id number] DOB: 1961/02/04 Today's Date: 03/13/2021    History of Present Illness 60 year old M admitted 03/03/21 who is 3 weeks status post left foot first ray amputation (02/10/21). He was admitted for pain and swelling in operative foot. He is now s/p L foot debridement with wound vac. PMH includes:  DM II with neuropathy and prior back surgery.    PT Comments    The pt is continuing to make good progress with mobility and adherence to WB precautions this session. After single reminder of NWB LLE, the pt was able to maintain with all sit-stand transfers (x5 through session) as well as with gait (x3 bouts, 25 ft, 30 ft, 15 ft). The pt remains limited in endurance and by pain at his surgical site, but was pleasant and motivated with therapies, hopeful to arrange place to stay with a friend as it has been difficult to secure SNF placement.   The pt will have 4 steps to enter with bilateral rails at friend's house, will need to practice stairs prior to anticipated d/c Wednesday.     Follow Up Recommendations  SNF - HHPT if d/c home to friend's house confirmed, will benefit from initial supervision for OOB/mobility if friends can provide     Equipment Recommendations  None recommended by PT    Recommendations for Other Services       Precautions / Restrictions Precautions Precautions: Fall;Other (comment) Precaution Comments: NWB LLE with improved maintenance Required Braces or Orthoses: Other Brace Other Brace: post op shoe, could not locate in room Restrictions Weight Bearing Restrictions: Yes LLE Weight Bearing: Non weight bearing    Mobility  Bed Mobility Overal bed mobility: Modified Independent Bed Mobility: Supine to Sit     Supine to sit: Modified independent (Device/Increase time) Sit to supine: Modified independent (Device/Increase time)   General bed mobility comments: Mod I for bed mobility.     Transfers Overall transfer level: Needs assistance Equipment used: Rolling walker (2 wheeled) Transfers: Sit to/from Stand Sit to Stand: Min guard         General transfer comment: initially max verbal cues to maintain NWB, but then able to complete with good stability and no verbal cues for WB through rest of session  Ambulation/Gait Ambulation/Gait assistance: Min guard Gait Distance (Feet): 25 Feet (+ 18ft) Assistive device: Rolling walker (2 wheeled) Gait Pattern/deviations: Step-to pattern;Decreased step length - right;Decreased step length - left;Decreased weight shift to left Gait velocity: decreased   General Gait Details: pt with good maintenance of LLE NWB with gait, rests heel on floor intermittently with static stance but maintains in front of him while hopping.   Stairs Stairs:  (discussed verbally, pt states 4 steps to enter with bilateral rails at friend's house he may be d/c to)           [employer]chant [employer] officer mobility: Yes Wheelchair propulsion: Both upper extremities Wheelchair parts: Supervision/cueing Distance: 200 Wheelchair Assistance Details (indicate cue type and reason): cues for use of breaks/leg rests, pt able to propell and steer without issue  Modified Rankin (Stroke Patients Only)       Balance Overall balance assessment: Needs assistance Sitting-balance support: Feet supported Sitting balance-Leahy Scale: Good     Standing balance support: Bilateral upper extremity supported;During functional activity Standing balance-Leahy Scale: Poor Standing balance comment: BUE essential for NWB and balance  Cognition Arousal/Alertness: Awake/alert Behavior During Therapy: Impulsive Overall Cognitive Status: History of cognitive impairments - at baseline Area of Impairment: Following commands;Safety/judgement;Awareness;Problem solving;Attention;Memory                    Current Attention Level: Sustained Memory: Decreased recall of precautions;Decreased short-term memory Following Commands: Follows one step commands consistently Safety/Judgement: Decreased awareness of safety Awareness: Emergent Problem Solving: Requires verbal cues;Requires tactile cues;Slow processing General Comments: pt stating he was never told of precautions for foot, but once reminded, was able to maintain through remainder of session without cues.      Exercises      General Comments General comments (skin integrity, edema, etc.): VSS, pt agreeable to all education. verbally discussed stair navigation, and need for additional assistance to manage RW      Pertinent Vitals/Pain Pain Assessment: Faces Faces Pain Scale: Hurts a little bit Pain Location: L foot Pain Descriptors / Indicators: Discomfort Pain Intervention(s): Limited activity within patient's tolerance;Monitored during session;Premedicated before session     PT Goals (current goals can now be found in the care plan section) Acute Rehab PT Goals Patient Stated Goal: to go to rehab PT Goal Formulation: With patient Time For Goal Achievement: 03/18/21 Potential to Achieve Goals: Good Progress towards PT goals: Progressing toward goals    Frequency    Min 3X/week      PT Plan Current plan remains appropriate       AM-PAC PT "6 Clicks" Mobility   Outcome Measure  Help needed turning from your back to your side while in a flat bed without using bedrails?: None Help needed moving from lying on your back to sitting on the side of a flat bed without using bedrails?: None Help needed moving to and from a bed to a chair (including a wheelchair)?: A Little Help needed standing up from a chair using your arms (e.g., wheelchair or bedside chair)?: A Little Help needed to walk in hospital room?: A Little Help needed climbing 3-5 steps with a railing? : A Lot 6 Click Score: 19    End of Session  Equipment Utilized During Treatment: Gait belt Activity Tolerance: Patient limited by pain;Patient tolerated treatment well Patient left: in bed;with call bell/phone within reach Nurse Communication: Mobility status (loss of post-op shoe) PT Visit Diagnosis: Unsteadiness on feet (R26.81);Difficulty in walking, not elsewhere classified (R26.2);Pain;Other abnormalities of gait and mobility (R26.89) Pain - Right/Left: Left Pain - part of body: Ankle and joints of foot     Time: 3295-1884 PT Time Calculation (min) (ACUTE ONLY): 35 min  Charges:  $Gait Training: 8-22 mins $Therapeutic Activity: 8-22 mins                     Rolm Baptise, PT, DPT   Acute Rehabilitation Department Pager #: [phone]   Gaetana Michaelis 03/13/2021, 9:30 AM

## 2021-03-13 NOTE — Progress Notes (Signed)
Patient alert and oriented x4, PT worked with him this morning. Breakfast tray delivered and eaten. Pain meds per patient request given, IV dilaudid 1mg .

## 2021-03-14 LAB — GLUCOSE, CAPILLARY
Glucose-Capillary: 149 mg/dL — ABNORMAL HIGH (ref 70–99)
Glucose-Capillary: 118 mg/dL — ABNORMAL HIGH (ref 70–99)
Glucose-Capillary: 133 mg/dL — ABNORMAL HIGH (ref 70–99)
Glucose-Capillary: 208 mg/dL — ABNORMAL HIGH (ref 70–99)

## 2021-03-14 NOTE — Progress Notes (Signed)
Patient ID: Brett White, male   DOB: [birth year], 60 y.o.   MRN: [id number] Patient is status post revision first ray amputation.  No clinical signs of infection the swelling is decreasing discussed the importance of elevation and active motor function of the toes and ankle to help decrease the swelling.  Plan for discharge to home tomorrow.

## 2021-03-14 NOTE — Progress Notes (Signed)
Physical Therapy Treatment Patient Details Name: Brett White MRN: [id number] DOB: 04-13-1961 Today's Date: 03/14/2021    History of Present Illness 60 year old M admitted 03/03/21 who is 3 weeks status post left foot first ray amputation (02/10/21). He was admitted for pain and swelling in operative foot. He is now s/p L foot debridement with wound vac. PMH includes:  DM II with neuropathy and prior back surgery.    PT Comments    Pt able to negotiate stairs and should be able to get into friend's house. Pt with pre-existing memory issues and likely will not remember to always follow NWB. Post op shoe still missing so I called [employer] and left message to have another one brought to room. Pt eager for dc.   Follow Up Recommendations  Home health PT;Supervision - Intermittent     Equipment Recommendations  None recommended by PT    Recommendations for Other Services       Precautions / Restrictions Precautions Precautions: Fall;Other (comment) Required Braces or Orthoses: Other Brace Other Brace: post op shoe, could not locate in room, new shoe ordered. Restrictions Weight Bearing Restrictions: Yes LLE Weight Bearing: Non weight bearing    Mobility  Bed Mobility Overal bed mobility: Modified Independent Bed Mobility: Supine to Sit     Supine to sit: Modified independent (Device/Increase time) Sit to supine: Modified independent (Device/Increase time)        Transfers Overall transfer level: Needs assistance Equipment used: Rolling walker (2 wheeled) Transfers: Sit to/from Stand Sit to Stand: Supervision         General transfer comment: verbal cues for weight bearing  Ambulation/Gait Ambulation/Gait assistance: Supervision Gait Distance (Feet): 20 Feet Assistive device: Rolling walker (2 wheeled) Gait Pattern/deviations: Step-to pattern;Decreased step length - right;Decreased step length - left;Decreased weight shift to left Gait velocity: decreased Gait  velocity interpretation: <1.31 ft/sec, indicative of household ambulator General Gait Details: Verbal cues to decr weight bearing. Pt placing heal on floor   Stairs Stairs: Yes Stairs assistance: Min guard Stair Management: Two rails;Step to pattern;Forwards Number of Stairs: 2 General stair comments: Pt using lt heal for balance   Wheelchair Mobility Wheelchair Mobility Wheelchair mobility: Yes Wheelchair propulsion: Both upper extremities Distance: 400  Modified Rankin (Stroke Patients Only)       Balance Overall balance assessment: Needs assistance Sitting-balance support: Feet supported Sitting balance-Leahy Scale: Good     Standing balance support: Bilateral upper extremity supported;During functional activity Standing balance-Leahy Scale: Poor Standing balance comment: BUE essential for NWB and balance                            Cognition Arousal/Alertness: Awake/alert Behavior During Therapy: Impulsive Overall Cognitive Status: History of cognitive impairments - at baseline Area of Impairment: Following commands;Safety/judgement;Awareness;Problem solving;Attention;Memory                   Current Attention Level: Sustained Memory: Decreased recall of precautions;Decreased short-term memory Following Commands: Follows one step commands consistently Safety/Judgement: Decreased awareness of safety Awareness: Emergent Problem Solving: Requires verbal cues;Slow processing General Comments: pt stating he was never told of precautions for foot, but once reminded, was able to maintain through remainder of session without cues.      Exercises      General Comments        Pertinent Vitals/Pain Pain Assessment: Faces Faces Pain Scale: Hurts a little bit Pain Location: L foot Pain Descriptors / Indicators: Discomfort;Guarding Pain Intervention(s):  Limited activity within patient's tolerance    Home Living                       Prior Function            PT Goals (current goals can now be found in the care plan section) Acute Rehab PT Goals Patient Stated Goal: go to friend's home Progress towards PT goals: Progressing toward goals    Frequency    Min 3X/week      PT Plan Discharge plan needs to be updated    Co-evaluation              AM-PAC PT "6 Clicks" Mobility   Outcome Measure  Help needed turning from your back to your side while in a flat bed without using bedrails?: None Help needed moving from lying on your back to sitting on the side of a flat bed without using bedrails?: None Help needed moving to and from a bed to a chair (including a wheelchair)?: A Little Help needed standing up from a chair using your arms (e.g., wheelchair or bedside chair)?: A Little Help needed to walk in hospital room?: A Little Help needed climbing 3-5 steps with a railing? : A Little 6 Click Score: 20    End of Session Equipment Utilized During Treatment: Gait belt Activity Tolerance: Patient tolerated treatment well Patient left: in bed;with call bell/phone within reach Nurse Communication: Mobility status (informed nurse I would call [employer] for new post op shoe) PT Visit Diagnosis: Unsteadiness on feet (R26.81);Difficulty in walking, not elsewhere classified (R26.2);Pain;Other abnormalities of gait and mobility (R26.89) Pain - Right/Left: Left Pain - part of body: Ankle and joints of foot     Time: 9604-5409 PT Time Calculation (min) (ACUTE ONLY): 22 min  Charges:  $Gait Training: 8-22 mins                     [facility] PT Acute Rehabilitation Services Pager [phone] Office [phone]    [name] 03/14/2021, 4:30 PM

## 2021-03-14 NOTE — Progress Notes (Signed)
Orthopedic Tech Progress Note Patient Details:  Brett White 1961-06-19 [id number]  Ortho Devices Type of Ortho Device: Postop shoe/boot Ortho Device/Splint Interventions: Ordered, Application, Adjustment   Post Interventions Patient Tolerated: Well Instructions Provided: Adjustment of device, Care of device, Poper ambulation with device Delivered and fit post op shoe per Dr. Audrie Lia order.  RN reports unable to find his original post op shoe, so requested a replacement.   Thanks,  Corinna Capra, PT, DPT  Acute Rehabilitation Ortho Tech Supervisor [phone] pager #[phone] office     Brett White Brett White 03/14/2021, 4:52 PM

## 2021-03-14 NOTE — Care Management Important Message (Signed)
Important Message  Patient Details  Name: Brett White MRN: [id number] Date of Birth: Sep 02, 1961   Medicare Important Message Given:  Yes  Patient has a contact precaution in place    [place] Perry Stefan Church 03/14/2021, 2:07 PM

## 2021-03-15 LAB — GLUCOSE, CAPILLARY
Glucose-Capillary: 173 mg/dL — ABNORMAL HIGH (ref 70–99)
Glucose-Capillary: 126 mg/dL — ABNORMAL HIGH (ref 70–99)

## 2021-03-15 MED ORDER — OXYCODONE HCL 10 MG PO TABS: 10.0000 mg | ORAL_TABLET | ORAL | 0 refills | Status: DC | PRN

## 2021-03-15 MED ORDER — DOXYCYCLINE HYCLATE 100 MG PO TABS: 100.0000 mg | ORAL_TABLET | Freq: Two times a day (BID) | ORAL | 0 refills | Status: DC

## 2021-03-15 NOTE — Progress Notes (Signed)
Patient is 2 weeks status post ray amputation.  Has had difficulty with placement as he does not have insurance and was denied return to nursing facility.  He does have a friend that has secured him some housing.  Patient says he does not think she is ready because he still has pain.  Vital signs stable except for some mild increase in blood pressure.  He is alert pleasant to exam.  Dressing was changed today by myself.  Incision is almost completely healed there is no surrounding erythema no drainage no cellulitis swelling is well controlled.  Patient currently not receiving any IV pain medication  Discussed with the patient follow-up we will discharge him today.  Reinforced the importance to pain control is elevating his foot and not putting weight on it.  We will follow-up in 1 week for suture removal in our office

## 2021-03-15 NOTE — Discharge Summary (Signed)
Discharge Diagnoses:  Active Problems:   Cutaneous abscess of left foot   Abscess of left foot   Surgeries: Procedure(s): LEFT FOOT DEBRIDEMENT on 03/03/2021    Consultants:   Discharged Condition: Improved  Hospital Course: Brett White is an 60 y.o. male who was admitted 03/03/2021 with a chief complaint of left foot abscess, with a final diagnosis of abscess left foot.  Patient was brought to the operating room on 03/03/2021 and underwent Procedure(s): LEFT FOOT DEBRIDEMENT.    Patient was given perioperative antibiotics:  Anti-infectives (From admission, onward)    Start     Dose/Rate Route Frequency Ordered Stop   03/15/21 0000  doxycycline (VIBRA-TABS) 100 MG tablet        100 mg Oral 2 times daily 03/15/21 0736     03/08/21 2200  doxycycline (VIBRA-TABS) tablet 100 mg        100 mg Oral 2 times per day 03/08/21 1415     03/06/21 0000  sulfamethoxazole-trimethoprim (BACTRIM DS) 800-160 MG tablet  Status:  Discontinued        1 tablet Oral 2 times daily 03/06/21 0650 03/06/21    03/03/21 2000  piperacillin-tazobactam (ZOSYN) IVPB 3.375 g  Status:  Discontinued        3.375 g 12.5 mL/hr over 240 Minutes Intravenous Every 8 hours 03/03/21 1902 03/05/21 1241   03/03/21 2000  vancomycin (VANCOREADY) IVPB 1250 mg/250 mL  Status:  Discontinued        1,250 mg 166.7 mL/hr over 90 Minutes Intravenous Every 12 hours 03/03/21 1902 03/08/21 1415   03/03/21 1333  vancomycin (VANCOCIN) powder  Status:  Discontinued          As needed 03/03/21 1333 03/03/21 1356   03/03/21 1332  gentamicin (GARAMYCIN) injection  Status:  Discontinued          As needed 03/03/21 1333 03/03/21 1356   03/03/21 1200  ceFAZolin (ANCEF) IVPB 2g/100 mL premix        2 g 200 mL/hr over 30 Minutes Intravenous On call to O.R. 03/03/21 1149 03/03/21 1350     .  Patient was given sequential compression devices, early ambulation, and aspirin for DVT prophylaxis.  Recent vital signs: Patient Vitals for the past  24 hrs:  BP Temp Temp src Pulse Resp SpO2  03/15/21 0629 (!) 134/95 97.8 F (36.6 C) Oral 77 17 98 %  03/14/21 2229 -- 98.5 F (36.9 C) Oral -- -- --  03/14/21 2039 137/86 -- Oral 76 18 96 %  03/14/21 1343 (!) 146/100 -- Oral 74 18 95 %  .  Recent laboratory studies: No results found.  Discharge Medications:   Allergies as of 03/15/2021   No Known Allergies      Medication List     STOP taking these medications    senna-docusate 8.6-50 MG tablet Commonly known as: Senokot-S   traMADol 50 MG tablet Commonly known as: ULTRAM       TAKE these medications    TUMS PO Take 1 tablet by mouth 3 (three) times daily with meals.   calcium carbonate 500 MG chewable tablet Commonly known as: TUMS - dosed in mg elemental calcium Chew 1 tablet (200 mg of elemental calcium total) by mouth 3 (three) times daily.   diphenhydramine-acetaminophen 25-500 MG Tabs tablet Commonly known as: TYLENOL PM Take 1 tablet by mouth at bedtime as needed (sleep/pain).   docusate sodium 100 MG capsule Commonly known as: COLACE Take 1 capsule (100 mg total) by mouth  daily.   doxycycline 100 MG tablet Commonly known as: VIBRA-TABS Take 100 mg by mouth 2 (two) times daily. What changed: Another medication with the same name was added. Make sure you understand how and when to take each.   doxycycline 100 MG tablet Commonly known as: VIBRA-TABS Take 1 tablet (100 mg total) by mouth 2 (two) times daily. What changed: You were already taking a medication with the same name, and this prescription was added. Make sure you understand how and when to take each.   DULoxetine 30 MG capsule Commonly known as: CYMBALTA Take 30 mg by mouth daily.   gabapentin 300 MG capsule Commonly known as: NEURONTIN Take 2 capsules (600 mg total) by mouth 3 (three) times daily. What changed:  how much to take Another medication with the same name was removed. Continue taking this medication, and follow the  directions you see here.   hydrochlorothiazide 12.5 MG tablet Commonly known as: HYDRODIURIL Take 1 tablet (12.5 mg total) by mouth daily.   ibuprofen 600 MG tablet Commonly known as: ADVIL Take 1 tablet (600 mg total) by mouth every 6 (six) hours as needed. What changed: reasons to take this   Lantus SoloStar 100 UNIT/ML Solostar Pen Generic drug: insulin glargine Inject 25 Units into the skin at bedtime.   methocarbamol 500 MG tablet Commonly known as: ROBAXIN Take 1 tablet (500 mg total) by mouth every 6 (six) hours as needed for muscle spasms.   multivitamin with minerals Tabs tablet Take 1 tablet by mouth daily.   NovoLOG FlexPen 100 UNIT/ML FlexPen Generic drug: insulin aspart Inject 5 Units into the skin with breakfast, with lunch, and with evening meal.   ondansetron 4 MG disintegrating tablet Commonly known as: Zofran ODT 4mg  ODT q4 hours prn nausea/vomit What changed:  how much to take how to take this when to take this reasons to take this additional instructions   Oxycodone HCl 10 MG Tabs Take 1-1.5 tablets (10-15 mg total) by mouth every 4 (four) hours as needed (pain score 7-10). What changed:  medication strength how much to take reasons to take this   pantoprazole 40 MG tablet Commonly known as: PROTONIX Take 1 tablet (40 mg total) by mouth daily.   polyethylene glycol 17 g packet Commonly known as: MIRALAX / GLYCOLAX Take 17 g by mouth 2 (two) times daily. What changed: when to take this   senna 8.6 MG Tabs tablet Commonly known as: SENOKOT Take 1 tablet by mouth 2 (two) times daily.   zinc sulfate 220 (50 Zn) MG capsule Take 1 capsule (220 mg total) by mouth daily.               Durable Medical Equipment  (From admission, onward)           Start     Ordered   03/10/21 1611  For home use only DME Walker rolling  Once       Question Answer Comment  Walker: With 5 Inch Wheels   Patient needs a walker to treat with the  following condition Weakness      03/10/21 1611   03/10/21 1611  For home use only DME 3 n 1  Once        03/10/21 1611              Discharge Care Instructions  (From admission, onward)           Start     Ordered   03/07/21 0000  Touch down weight bearing       Question Answer Comment  Laterality left   Extremity Lower      03/07/21 0837            Diagnostic Studies: [facility] Chest Port 1 View  Result Date: 02/21/2021 CLINICAL DATA:  Chest pain. EXAM: PORTABLE CHEST 1 VIEW COMPARISON:  02/02/2021 FINDINGS: Stable heart size and mediastinal contours. Mild aortic tortuosity. Minor bibasilar atelectasis. No confluent consolidation. No pleural effusion or pneumothorax. No acute osseous abnormalities are seen. IMPRESSION: Minor bibasilar atelectasis. Electronically Signed   By: Narda Rutherford M.D.   On: 02/21/2021 20:12   DG Foot Complete Left  Result Date: 02/21/2021 CLINICAL DATA:  Left foot swelling. Recent surgery. Rule out osteomyelitis. EXAM: LEFT FOOT - COMPLETE 3+ VIEW COMPARISON:  Preoperative imaging 02/02/2021 FINDINGS: Resection of the first ray at the proximal metatarsal, resection margin is smooth. Soft tissue edema throughout the operative bed with occasional foci of air. There is a small osseous density about the medial aspect of the medial cuneiform that was not definitively seen on prior. No other erosive or destructive change. Mild hammertoe deformity of the digits. No radiopaque foreign body. Moderate plantar calcaneal spur and Achilles tendon enthesophyte. IMPRESSION: 1. Post resection of the first ray at the proximal metatarsal. Soft tissue edema throughout the operative bed with occasional foci of air. This may be due to postsurgical change or soft tissue infection. 2. Small osseous density about the medial aspect of the medial cuneiform, not definitively seen on prior, unclear if this is postsurgical or may represent early osteomyelitis. Electronically Signed    By: Narda Rutherford M.D.   On: 02/21/2021 20:15   XR Foot 2 Views Left  Result Date: 03/02/2021 2 view radiographs of the left foot shows the first ray amputation with some irregularity at the distal aspect of the medial cuneiform.   Patient benefited maximally from their hospital stay and there were no complications.     Disposition: Discharge disposition: 01-Home or Self Care      Discharge Instructions     Call MD / Call 911   Complete by: As directed    If you experience chest pain or shortness of breath, CALL 911 and be transported to the hospital emergency room.  If you develope a fever above 101 F, pus (white drainage) or increased drainage or redness at the wound, or calf pain, call your surgeon's office.   Call MD / Call 911   Complete by: As directed    If you experience chest pain or shortness of breath, CALL 911 and be transported to the hospital emergency room.  If you develope a fever above 101 F, pus (white drainage) or increased drainage or redness at the wound, or calf pain, call your surgeon's office.   Call MD / Call 911   Complete by: As directed    If you experience chest pain or shortness of breath, CALL 911 and be transported to the hospital emergency room.  If you develope a fever above 101 F, pus (white drainage) or increased drainage or redness at the wound, or calf pain, call your surgeon's office.   Constipation Prevention   Complete by: As directed    Drink plenty of fluids.  Prune juice may be helpful.  You may use a stool softener, such as Colace (over the counter) 100 mg twice a day.  Use MiraLax (over the counter) for constipation as needed.   Constipation Prevention   Complete  by: As directed    Drink plenty of fluids.  Prune juice may be helpful.  You may use a stool softener, such as Colace (over the counter) 100 mg twice a day.  Use MiraLax (over the counter) for constipation as needed.   Constipation Prevention   Complete by: As directed     Drink plenty of fluids.  Prune juice may be helpful.  You may use a stool softener, such as Colace (over the counter) 100 mg twice a day.  Use MiraLax (over the counter) for constipation as needed.   Diet - low sodium heart healthy   Complete by: As directed    Diet - low sodium heart healthy   Complete by: As directed    Diet - low sodium heart healthy   Complete by: As directed    Discharge instructions   Complete by: As directed    Nonweight bearing. Please call office if vac alarms   Discharge instructions   Complete by: As directed    Call office if vac stops working or alarms   Increase activity slowly as tolerated   Complete by: As directed    Increase activity slowly as tolerated   Complete by: As directed    Increase activity slowly as tolerated   Complete by: As directed    Negative Pressure Wound Therapy - Incisional   Complete by: As directed    Show patient how to attach preveena vac. Should call office if alarms   Post-op shoe   Complete by: As directed    Post-operative opioid taper instructions:   Complete by: As directed    POST-OPERATIVE OPIOID TAPER INSTRUCTIONS: It is important to wean off of your opioid medication as soon as possible. If you do not need pain medication after your surgery it is ok to stop day one. Opioids include: Codeine, Hydrocodone(Norco, Vicodin), Oxycodone(Percocet, oxycontin) and hydromorphone amongst others.  Long term and even short term use of opiods can cause: Increased pain response Dependence Constipation Depression Respiratory depression And more.  Withdrawal symptoms can include Flu like symptoms Nausea, vomiting And more Techniques to manage these symptoms Hydrate well Eat regular healthy meals Stay active Use relaxation techniques(deep breathing, meditating, yoga) Do Not substitute Alcohol to help with tapering If you have been on opioids for less than two weeks and do not have pain than it is ok to stop all together.   Plan to wean off of opioids This plan should start within one week post op of your joint replacement. Maintain the same interval or time between taking each dose and first decrease the dose.  Cut the total daily intake of opioids by one tablet each day Next start to increase the time between doses. The last dose that should be eliminated is the evening dose.      Post-operative opioid taper instructions:   Complete by: As directed    POST-OPERATIVE OPIOID TAPER INSTRUCTIONS: It is important to wean off of your opioid medication as soon as possible. If you do not need pain medication after your surgery it is ok to stop day one. Opioids include: Codeine, Hydrocodone(Norco, Vicodin), Oxycodone(Percocet, oxycontin) and hydromorphone amongst others.  Long term and even short term use of opiods can cause: Increased pain response Dependence Constipation Depression Respiratory depression And more.  Withdrawal symptoms can include Flu like symptoms Nausea, vomiting And more Techniques to manage these symptoms Hydrate well Eat regular healthy meals Stay active Use relaxation techniques(deep breathing, meditating, yoga) Do Not substitute Alcohol  to help with tapering If you have been on opioids for less than two weeks and do not have pain than it is ok to stop all together.  Plan to wean off of opioids This plan should start within one week post op of your joint replacement. Maintain the same interval or time between taking each dose and first decrease the dose.  Cut the total daily intake of opioids by one tablet each day Next start to increase the time between doses. The last dose that should be eliminated is the evening dose.      Post-operative opioid taper instructions:   Complete by: As directed    POST-OPERATIVE OPIOID TAPER INSTRUCTIONS: It is important to wean off of your opioid medication as soon as possible. If you do not need pain medication after your surgery it is ok  to stop day one. Opioids include: Codeine, Hydrocodone(Norco, Vicodin), Oxycodone(Percocet, oxycontin) and hydromorphone amongst others.  Long term and even short term use of opiods can cause: Increased pain response Dependence Constipation Depression Respiratory depression And more.  Withdrawal symptoms can include Flu like symptoms Nausea, vomiting And more Techniques to manage these symptoms Hydrate well Eat regular healthy meals Stay active Use relaxation techniques(deep breathing, meditating, yoga) Do Not substitute Alcohol to help with tapering If you have been on opioids for less than two weeks and do not have pain than it is ok to stop all together.  Plan to wean off of opioids This plan should start within one week post op of your joint replacement. Maintain the same interval or time between taking each dose and first decrease the dose.  Cut the total daily intake of opioids by one tablet each day Next start to increase the time between doses. The last dose that should be eliminated is the evening dose.      Touch down weight bearing   Complete by: As directed    Laterality: left   Extremity: Lower       Follow-up Information     Adonis Huguenin, NP In 1 week.   Specialty: Orthopedic Surgery Contact information: [address] [place] [address] [phone]         [facility], [facility] Follow up.   Specialty: Home Health Services Contact information: [address] [place] [address] [phone]                  Signed: [place] Allyson Tineo 03/15/2021, 7:36 AM

## 2021-03-16 ENCOUNTER — Other Ambulatory Visit: Payer: Self-pay

## 2021-03-16 ENCOUNTER — Inpatient Hospital Stay (HOSPITAL_COMMUNITY)
Admission: EM | Admit: 2021-03-16 | Discharge: 2021-03-29 | DRG: 475 | Disposition: A | Discharge status: Skilled Nursing Facility | Payer: Medicare HMO | Attending: Internal Medicine | Admitting: Internal Medicine

## 2021-03-16 DIAGNOSIS — K219 Gastro-esophageal reflux disease without esophagitis: Secondary | ICD-10-CM | POA: Diagnosis present

## 2021-03-16 DIAGNOSIS — M25572 Pain in left ankle and joints of left foot: Secondary | ICD-10-CM

## 2021-03-16 DIAGNOSIS — Z79899 Other long term (current) drug therapy: Secondary | ICD-10-CM

## 2021-03-16 DIAGNOSIS — T8744 Infection of amputation stump, left lower extremity: Principal | ICD-10-CM | POA: Diagnosis present

## 2021-03-16 DIAGNOSIS — K5903 Drug induced constipation: Secondary | ICD-10-CM | POA: Diagnosis present

## 2021-03-16 DIAGNOSIS — Y838 Other surgical procedures as the cause of abnormal reaction of the patient, or of later complication, without mention of misadventure at the time of the procedure: Secondary | ICD-10-CM | POA: Diagnosis present

## 2021-03-16 DIAGNOSIS — E1169 Type 2 diabetes mellitus with other specified complication: Secondary | ICD-10-CM | POA: Diagnosis present

## 2021-03-16 DIAGNOSIS — D72829 Elevated white blood cell count, unspecified: Secondary | ICD-10-CM | POA: Diagnosis present

## 2021-03-16 DIAGNOSIS — L089 Local infection of the skin and subcutaneous tissue, unspecified: Secondary | ICD-10-CM | POA: Diagnosis present

## 2021-03-16 DIAGNOSIS — M86172 Other acute osteomyelitis, left ankle and foot: Secondary | ICD-10-CM | POA: Diagnosis present

## 2021-03-16 DIAGNOSIS — F419 Anxiety disorder, unspecified: Secondary | ICD-10-CM | POA: Diagnosis present

## 2021-03-16 DIAGNOSIS — N179 Acute kidney failure, unspecified: Secondary | ICD-10-CM | POA: Diagnosis present

## 2021-03-16 DIAGNOSIS — E1165 Type 2 diabetes mellitus with hyperglycemia: Secondary | ICD-10-CM

## 2021-03-16 DIAGNOSIS — E1142 Type 2 diabetes mellitus with diabetic polyneuropathy: Secondary | ICD-10-CM | POA: Diagnosis present

## 2021-03-16 DIAGNOSIS — F32A Depression, unspecified: Secondary | ICD-10-CM | POA: Diagnosis present

## 2021-03-16 DIAGNOSIS — T402X5A Adverse effect of other opioids, initial encounter: Secondary | ICD-10-CM | POA: Diagnosis present

## 2021-03-16 DIAGNOSIS — B9562 Methicillin resistant Staphylococcus aureus infection as the cause of diseases classified elsewhere: Secondary | ICD-10-CM | POA: Diagnosis present

## 2021-03-16 DIAGNOSIS — S92502A Displaced unspecified fracture of left lesser toe(s), initial encounter for closed fracture: Secondary | ICD-10-CM | POA: Diagnosis present

## 2021-03-16 DIAGNOSIS — I119 Hypertensive heart disease without heart failure: Secondary | ICD-10-CM | POA: Diagnosis present

## 2021-03-16 DIAGNOSIS — Z20822 Contact with and (suspected) exposure to covid-19: Secondary | ICD-10-CM | POA: Diagnosis present

## 2021-03-16 DIAGNOSIS — R269 Unspecified abnormalities of gait and mobility: Secondary | ICD-10-CM | POA: Diagnosis present

## 2021-03-16 DIAGNOSIS — Z794 Long term (current) use of insulin: Secondary | ICD-10-CM

## 2021-03-16 DIAGNOSIS — G8918 Other acute postprocedural pain: Secondary | ICD-10-CM | POA: Diagnosis not present

## 2021-03-16 NOTE — ED Notes (Signed)
Pt endorses feeling feverish, taking several cold showers since surgery. States he's thrown up several times today

## 2021-03-16 NOTE — ED Provider Notes (Addendum)
.   Emergency Medicine Provider Triage Evaluation Note  Brett White , a 60 y.o. male  was evaluated in triage.  Pt complains of wound infection and bleeding.  Recent amputation of left great toe by Dr. 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  States bleeding started today with some fevers/chills.  Review of Systems  Positive: Bleeding, fever Negative: Numbness, chest pain  Physical Exam  BP 140/86 (BP Location: Left Arm)   Pulse 93   Temp 97.7 F (36.5 C) (Oral)   Resp 15   SpO2 100%  Gen:   Awake, no distress   Resp:  Normal effort  MSK:   Moves extremities without difficulty  Other:  Wound undressed-- sutures appears intact without wound dehiscence.  No purulent drainage.  There is some erythema.  Tissue is somewhat macerated beneath dressing.      Medical Decision Making  Medically screening exam initiated at 11:55 PM.  Appropriate orders placed.  Brett White was informed that the remainder of the evaluation will be completed by another provider, this initial triage assessment does not replace that evaluation, and the importance of remaining in the ED until their evaluation is complete.  1:38 AM Lactic has come back at 2.4.  Will upgrade to level 2 and prioritize room assignment.    Garlon Hatchet, PA-C 03/17/21 0006    Garlon Hatchet, PA-C 03/17/21 0139    Glynn Octave, MD 03/17/21 [phone]

## 2021-03-16 NOTE — ED Triage Notes (Signed)
Pt c/o pain to L foot after great toe removed approx 2wks ago. Denies injury, but states wound began bleeding today & started throbbing. Bleeding controlled in triage, swelling noted to foot/toes.

## 2021-03-17 ENCOUNTER — Emergency Department (HOSPITAL_COMMUNITY): Payer: Medicare HMO

## 2021-03-17 ENCOUNTER — Encounter (HOSPITAL_COMMUNITY): Payer: Self-pay | Admitting: Internal Medicine

## 2021-03-17 ENCOUNTER — Other Ambulatory Visit: Payer: Self-pay

## 2021-03-17 DIAGNOSIS — F419 Anxiety disorder, unspecified: Secondary | ICD-10-CM | POA: Diagnosis present

## 2021-03-17 DIAGNOSIS — E1142 Type 2 diabetes mellitus with diabetic polyneuropathy: Secondary | ICD-10-CM | POA: Diagnosis not present

## 2021-03-17 DIAGNOSIS — Y838 Other surgical procedures as the cause of abnormal reaction of the patient, or of later complication, without mention of misadventure at the time of the procedure: Secondary | ICD-10-CM | POA: Diagnosis present

## 2021-03-17 DIAGNOSIS — Z79899 Other long term (current) drug therapy: Secondary | ICD-10-CM | POA: Diagnosis not present

## 2021-03-17 DIAGNOSIS — E1169 Type 2 diabetes mellitus with other specified complication: Secondary | ICD-10-CM | POA: Diagnosis not present

## 2021-03-17 DIAGNOSIS — F32A Depression, unspecified: Secondary | ICD-10-CM | POA: Diagnosis not present

## 2021-03-17 DIAGNOSIS — M86172 Other acute osteomyelitis, left ankle and foot: Secondary | ICD-10-CM | POA: Diagnosis not present

## 2021-03-17 DIAGNOSIS — Z794 Long term (current) use of insulin: Secondary | ICD-10-CM | POA: Diagnosis not present

## 2021-03-17 DIAGNOSIS — R269 Unspecified abnormalities of gait and mobility: Secondary | ICD-10-CM | POA: Diagnosis present

## 2021-03-17 DIAGNOSIS — E1165 Type 2 diabetes mellitus with hyperglycemia: Secondary | ICD-10-CM | POA: Diagnosis not present

## 2021-03-17 DIAGNOSIS — L089 Local infection of the skin and subcutaneous tissue, unspecified: Secondary | ICD-10-CM | POA: Diagnosis not present

## 2021-03-17 DIAGNOSIS — B9562 Methicillin resistant Staphylococcus aureus infection as the cause of diseases classified elsewhere: Secondary | ICD-10-CM | POA: Diagnosis not present

## 2021-03-17 DIAGNOSIS — Z20822 Contact with and (suspected) exposure to covid-19: Secondary | ICD-10-CM | POA: Diagnosis not present

## 2021-03-17 DIAGNOSIS — E119 Type 2 diabetes mellitus without complications: Secondary | ICD-10-CM | POA: Diagnosis not present

## 2021-03-17 DIAGNOSIS — M25572 Pain in left ankle and joints of left foot: Secondary | ICD-10-CM

## 2021-03-17 DIAGNOSIS — S92502A Displaced unspecified fracture of left lesser toe(s), initial encounter for closed fracture: Secondary | ICD-10-CM | POA: Diagnosis present

## 2021-03-17 DIAGNOSIS — T402X5A Adverse effect of other opioids, initial encounter: Secondary | ICD-10-CM | POA: Diagnosis present

## 2021-03-17 DIAGNOSIS — I119 Hypertensive heart disease without heart failure: Secondary | ICD-10-CM | POA: Diagnosis not present

## 2021-03-17 DIAGNOSIS — G8918 Other acute postprocedural pain: Secondary | ICD-10-CM | POA: Diagnosis not present

## 2021-03-17 DIAGNOSIS — N179 Acute kidney failure, unspecified: Secondary | ICD-10-CM | POA: Diagnosis not present

## 2021-03-17 DIAGNOSIS — K5903 Drug induced constipation: Secondary | ICD-10-CM | POA: Diagnosis present

## 2021-03-17 DIAGNOSIS — T8744 Infection of amputation stump, left lower extremity: Secondary | ICD-10-CM | POA: Diagnosis not present

## 2021-03-17 DIAGNOSIS — D72829 Elevated white blood cell count, unspecified: Secondary | ICD-10-CM | POA: Diagnosis not present

## 2021-03-17 DIAGNOSIS — K219 Gastro-esophageal reflux disease without esophagitis: Secondary | ICD-10-CM | POA: Diagnosis present

## 2021-03-17 LAB — CBC
HCT: 38.3 % — ABNORMAL LOW (ref 39.0–52.0)
Hemoglobin: 12.4 g/dL — ABNORMAL LOW (ref 13.0–17.0)
MCH: 28.1 pg (ref 26.0–34.0)
MCHC: 32.4 g/dL (ref 30.0–36.0)
MCV: 86.7 fL (ref 80.0–100.0)
Platelets: 295 10*3/uL (ref 150–400)
RBC: 4.42 MIL/uL (ref 4.22–5.81)
RDW: 13.2 % (ref 11.5–15.5)
WBC: 7 10*3/uL (ref 4.0–10.5)
nRBC: 0 % (ref 0.0–0.2)

## 2021-03-17 LAB — COMPREHENSIVE METABOLIC PANEL
ALT: 29 U/L (ref 0–44)
AST: 23 U/L (ref 15–41)
Albumin: 3.8 g/dL (ref 3.5–5.0)
Alkaline Phosphatase: 75 U/L (ref 38–126)
Anion gap: 12 (ref 5–15)
BUN: 39 mg/dL — ABNORMAL HIGH (ref 6–20)
CO2: 27 mmol/L (ref 22–32)
Calcium: 9.6 mg/dL (ref 8.9–10.3)
Chloride: 98 mmol/L (ref 98–111)
Creatinine, Ser: 1.41 mg/dL — ABNORMAL HIGH (ref 0.61–1.24)
GFR, Estimated: 58 mL/min — ABNORMAL LOW (ref >60)
Glucose, Bld: 240 mg/dL — ABNORMAL HIGH (ref 70–99)
Potassium: 4.2 mmol/L (ref 3.5–5.1)
Sodium: 137 mmol/L (ref 135–145)
Total Bilirubin: 0.4 mg/dL (ref 0.3–1.2)
Total Protein: 8.3 g/dL — ABNORMAL HIGH (ref 6.5–8.1)

## 2021-03-17 LAB — SEDIMENTATION RATE: Sed Rate: 34 mm/hr — ABNORMAL HIGH (ref 0–16)

## 2021-03-17 LAB — CBC WITH DIFFERENTIAL/PLATELET
Abs Immature Granulocytes: 0.03 10*3/uL (ref 0.00–0.07)
Basophils Absolute: 0 10*3/uL (ref 0.0–0.1)
Basophils Relative: 0 %
Eosinophils Absolute: 0 10*3/uL (ref 0.0–0.5)
Eosinophils Relative: 1 %
HCT: 41.5 % (ref 39.0–52.0)
Hemoglobin: 13.4 g/dL (ref 13.0–17.0)
Immature Granulocytes: 0 %
Lymphocytes Relative: 17 %
Lymphs Abs: 1.3 10*3/uL (ref 0.7–4.0)
MCH: 28.5 pg (ref 26.0–34.0)
MCHC: 32.3 g/dL (ref 30.0–36.0)
MCV: 88.3 fL (ref 80.0–100.0)
Monocytes Absolute: 0.6 10*3/uL (ref 0.1–1.0)
Monocytes Relative: 7 %
Neutro Abs: 5.8 10*3/uL (ref 1.7–7.7)
Neutrophils Relative %: 75 %
Platelets: 349 10*3/uL (ref 150–400)
RBC: 4.7 MIL/uL (ref 4.22–5.81)
RDW: 12.9 % (ref 11.5–15.5)
WBC: 7.8 10*3/uL (ref 4.0–10.5)
nRBC: 0 % (ref 0.0–0.2)

## 2021-03-17 LAB — GLUCOSE, CAPILLARY
Glucose-Capillary: 191 mg/dL — ABNORMAL HIGH (ref 70–99)
Glucose-Capillary: 236 mg/dL — ABNORMAL HIGH (ref 70–99)
Glucose-Capillary: 155 mg/dL — ABNORMAL HIGH (ref 70–99)
Glucose-Capillary: 235 mg/dL — ABNORMAL HIGH (ref 70–99)

## 2021-03-17 LAB — LACTIC ACID, PLASMA: Lactic Acid, Venous: 2.4 mmol/L (ref 0.5–1.9)

## 2021-03-17 LAB — PHOSPHORUS: Phosphorus: 3.5 mg/dL (ref 2.5–4.6)

## 2021-03-17 LAB — BASIC METABOLIC PANEL
Anion gap: 12 (ref 5–15)
BUN: 40 mg/dL — ABNORMAL HIGH (ref 6–20)
CO2: 27 mmol/L (ref 22–32)
Calcium: 9.3 mg/dL (ref 8.9–10.3)
Chloride: 100 mmol/L (ref 98–111)
Creatinine, Ser: 1.21 mg/dL (ref 0.61–1.24)
GFR, Estimated: >60 mL/min (ref >60)
Glucose, Bld: 189 mg/dL — ABNORMAL HIGH (ref 70–99)
Potassium: 3.8 mmol/L (ref 3.5–5.1)
Sodium: 139 mmol/L (ref 135–145)

## 2021-03-17 LAB — MAGNESIUM: Magnesium: 1.8 mg/dL (ref 1.7–2.4)

## 2021-03-17 LAB — C-REACTIVE PROTEIN: CRP: <0.5 mg/dL (ref <1.0)

## 2021-03-17 LAB — SARS CORONAVIRUS 2 (TAT 6-24 HRS): SARS Coronavirus 2: NEGATIVE

## 2021-03-17 IMAGING — DX DG FOOT COMPLETE 3+V*L*
2 series · 3 of 3 positions shown · non-contrast
Comparison: [DATE]

CLINICAL DATA: Recent amputation with worsening infection and
bleeding.

EXAM:
LEFT FOOT - COMPLETE 3+ VIEW

[Series 1: foot · 0.14mm/px · 2 of 2 slices shown]
[im 1/2]
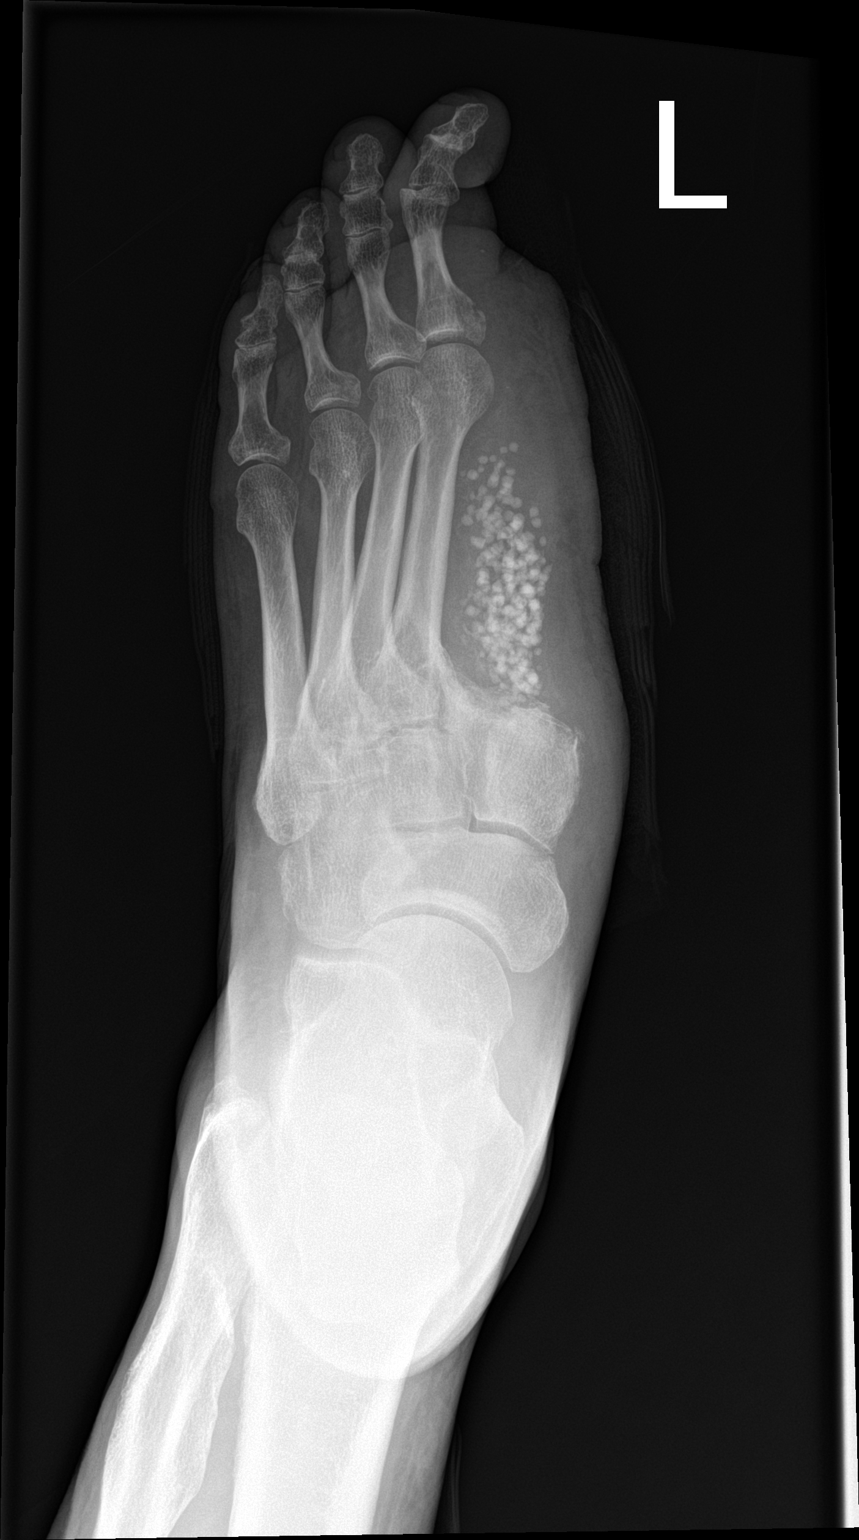
[im 2/2]
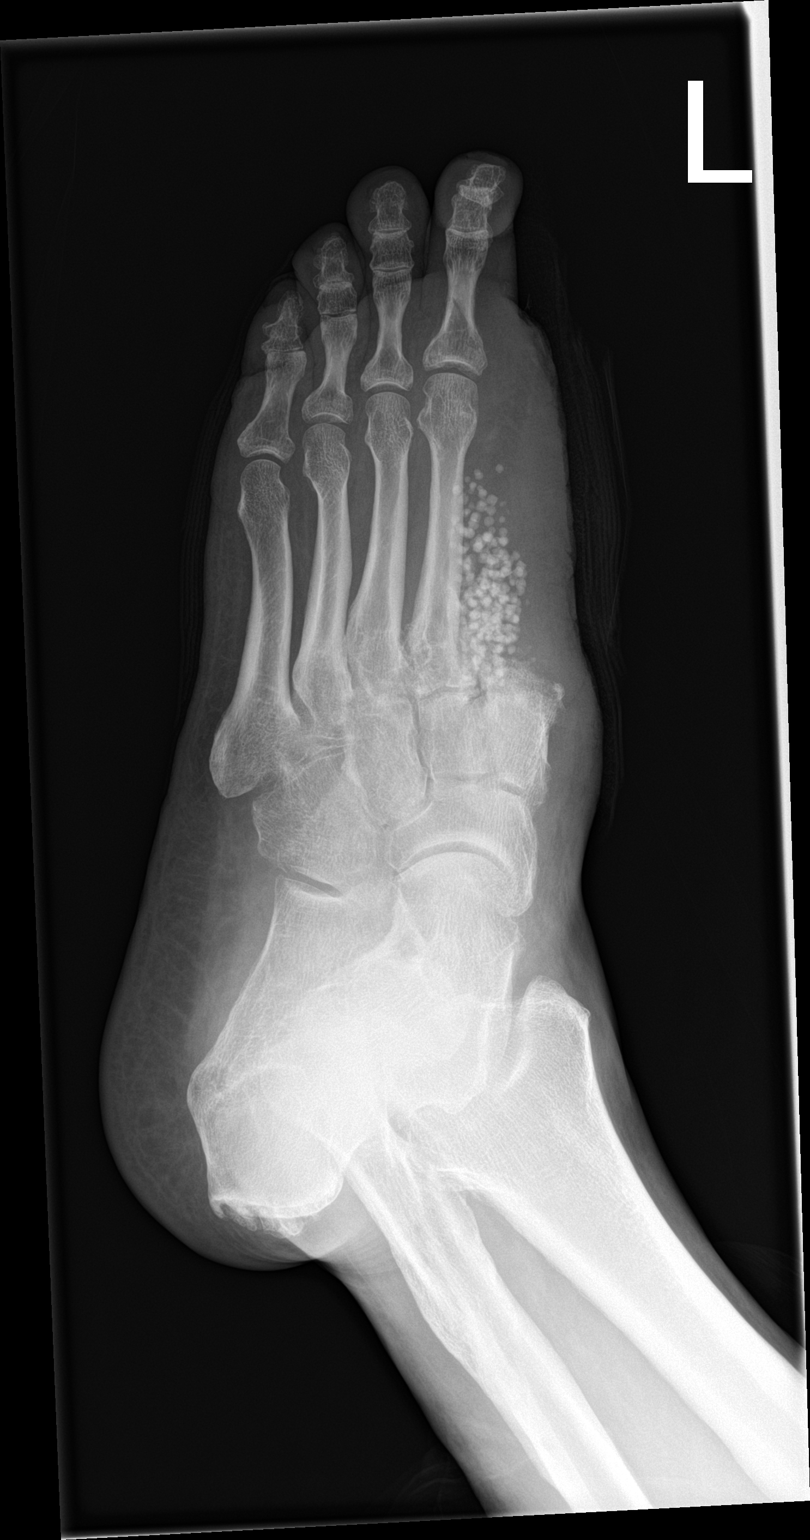

[leg]
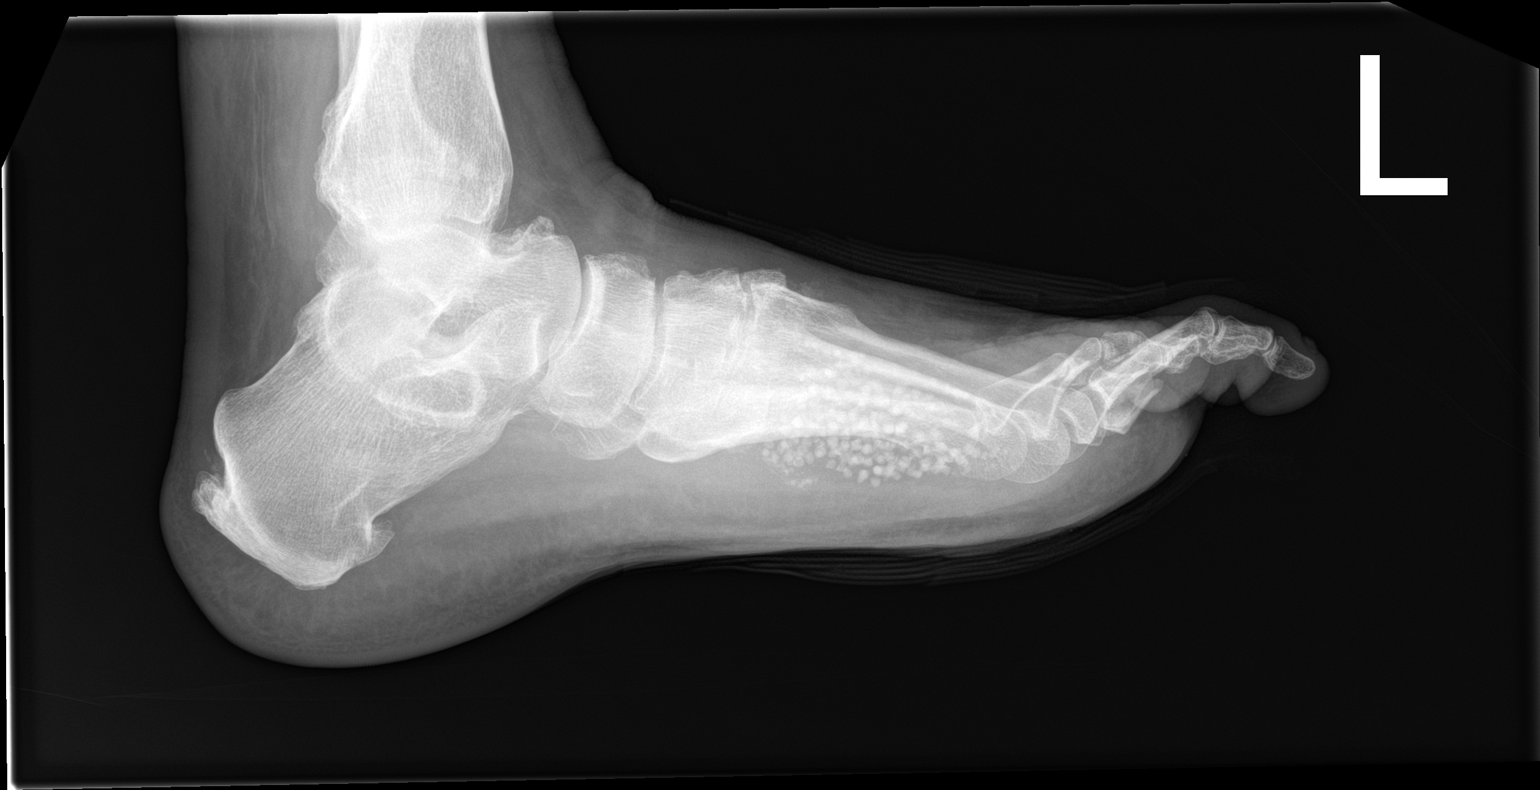

[3 of 3 positions shown; findings below may reference images not displayed]

FINDINGS: Postoperative changes with amputation of the left first ray at the
tarsometatarsal level. Foreign material in the postoperative soft
tissues consistent with packing material. No radiopaque soft tissue
gas collections. Bones appear unchanged since prior study.
Degenerative changes in the intertarsal joints.
IMPRESSION: Postoperative amputation of the left first ray at the
tarsometatarsal level. No significant changes in appearance since
previous study.

## 2021-03-17 MED ORDER — SENNOSIDES-DOCUSATE SODIUM 8.6-50 MG PO TABS
2.0000 | ORAL_TABLET | Freq: Every day | ORAL | Status: DC
  Administered 2021-03-17 – 2021-03-28 (×11): 2 via ORAL
  Filled 2021-03-17 (×12): qty 2

## 2021-03-17 MED ORDER — PROCHLORPERAZINE EDISYLATE 10 MG/2ML IJ SOLN
5.0000 mg | Freq: Four times a day (QID) | INTRAMUSCULAR | Status: DC | PRN
  Administered 2021-03-19 – 2021-03-21 (×2): 5 mg via INTRAVENOUS
  Filled 2021-03-17 (×2): qty 2

## 2021-03-17 MED ORDER — MORPHINE SULFATE (PF) 4 MG/ML IV SOLN
4.0000 mg | Freq: Once | INTRAVENOUS | Status: AC
  Administered 2021-03-17: 4 mg via INTRAVENOUS
  Filled 2021-03-17: qty 1

## 2021-03-17 MED ORDER — INSULIN GLARGINE 100 UNIT/ML ~~LOC~~ SOLN
5.0000 [IU] | Freq: Two times a day (BID) | SUBCUTANEOUS | Status: DC
  Administered 2021-03-17 – 2021-03-29 (×24): 5 [IU] via SUBCUTANEOUS
  Filled 2021-03-17 (×26): qty 0.05

## 2021-03-17 MED ORDER — OXYCODONE HCL 5 MG PO TABS
5.0000 mg | ORAL_TABLET | ORAL | Status: DC | PRN
  Administered 2021-03-17 (×2): 10 mg via ORAL
  Filled 2021-03-17 (×2): qty 2

## 2021-03-17 MED ORDER — METRONIDAZOLE 500 MG/100ML IV SOLN
500.0000 mg | Freq: Three times a day (TID) | INTRAVENOUS | Status: AC
  Administered 2021-03-17 – 2021-03-23 (×21): 500 mg via INTRAVENOUS
  Filled 2021-03-17 (×22): qty 100

## 2021-03-17 MED ORDER — GABAPENTIN 300 MG PO CAPS
300.0000 mg | ORAL_CAPSULE | Freq: Two times a day (BID) | ORAL | Status: DC
  Administered 2021-03-17 – 2021-03-22 (×12): 300 mg via ORAL
  Filled 2021-03-17 (×12): qty 1

## 2021-03-17 MED ORDER — HYDROMORPHONE HCL 1 MG/ML IJ SOLN
0.5000 mg | INTRAMUSCULAR | Status: DC | PRN
  Administered 2021-03-17 – 2021-03-18 (×5): 0.5 mg via INTRAVENOUS
  Filled 2021-03-17 (×5): qty 0.5

## 2021-03-17 MED ORDER — ZINC SULFATE 220 (50 ZN) MG PO CAPS
220.0000 mg | ORAL_CAPSULE | Freq: Every day | ORAL | Status: DC
  Administered 2021-03-17 – 2021-03-29 (×12): 220 mg via ORAL
  Filled 2021-03-17 (×12): qty 1

## 2021-03-17 MED ORDER — PANTOPRAZOLE SODIUM 40 MG PO TBEC
40.0000 mg | DELAYED_RELEASE_TABLET | Freq: Every day | ORAL | Status: DC
  Administered 2021-03-17 – 2021-03-29 (×13): 40 mg via ORAL
  Filled 2021-03-17 (×3): qty 1
  Filled 2021-03-17: qty 2
  Filled 2021-03-17: qty 1
  Filled 2021-03-17: qty 2
  Filled 2021-03-17 (×7): qty 1

## 2021-03-17 MED ORDER — LACTATED RINGERS IV SOLN: INTRAVENOUS | Status: DC

## 2021-03-17 MED ORDER — VANCOMYCIN HCL 2000 MG/400ML IV SOLN
2000.0000 mg | Freq: Once | INTRAVENOUS | Status: AC
  Administered 2021-03-17: 2000 mg via INTRAVENOUS
  Filled 2021-03-17: qty 400

## 2021-03-17 MED ORDER — VANCOMYCIN HCL 1250 MG/250ML IV SOLN
1250.0000 mg | Freq: Two times a day (BID) | INTRAVENOUS | Status: DC
  Administered 2021-03-17 – 2021-03-18 (×2): 1250 mg via INTRAVENOUS
  Filled 2021-03-17 (×2): qty 250

## 2021-03-17 MED ORDER — HYDROMORPHONE HCL 1 MG/ML IJ SOLN
1.0000 mg | INTRAMUSCULAR | Status: DC | PRN
  Administered 2021-03-17: 1 mg via INTRAVENOUS
  Filled 2021-03-17: qty 1

## 2021-03-17 MED ORDER — INSULIN ASPART 100 UNIT/ML IJ SOLN
0.0000 [IU] | INTRAMUSCULAR | Status: DC
  Administered 2021-03-17 (×2): 4 [IU] via SUBCUTANEOUS
  Administered 2021-03-17 (×2): 7 [IU] via SUBCUTANEOUS
  Administered 2021-03-18: 3 [IU] via SUBCUTANEOUS
  Administered 2021-03-18 (×2): 4 [IU] via SUBCUTANEOUS
  Administered 2021-03-18 (×2): 7 [IU] via SUBCUTANEOUS
  Administered 2021-03-18: 3 [IU] via SUBCUTANEOUS
  Administered 2021-03-19: 11 [IU] via SUBCUTANEOUS
  Administered 2021-03-19: 4 [IU] via SUBCUTANEOUS
  Administered 2021-03-19: 3 [IU] via SUBCUTANEOUS
  Administered 2021-03-19: 4 [IU] via SUBCUTANEOUS
  Administered 2021-03-19: 3 [IU] via SUBCUTANEOUS
  Administered 2021-03-20: 7 [IU] via SUBCUTANEOUS
  Administered 2021-03-20: 11 [IU] via SUBCUTANEOUS
  Administered 2021-03-20 – 2021-03-21 (×4): 4 [IU] via SUBCUTANEOUS
  Administered 2021-03-21: 3 [IU] via SUBCUTANEOUS
  Administered 2021-03-21: 7 [IU] via SUBCUTANEOUS
  Administered 2021-03-22 (×3): 4 [IU] via SUBCUTANEOUS
  Administered 2021-03-23 (×2): 3 [IU] via SUBCUTANEOUS
  Administered 2021-03-23 (×2): 4 [IU] via SUBCUTANEOUS
  Administered 2021-03-24: 3 [IU] via SUBCUTANEOUS
  Administered 2021-03-24: 4 [IU] via SUBCUTANEOUS
  Administered 2021-03-24: 7 [IU] via SUBCUTANEOUS
  Administered 2021-03-24 – 2021-03-25 (×4): 4 [IU] via SUBCUTANEOUS
  Administered 2021-03-25: 3 [IU] via SUBCUTANEOUS
  Administered 2021-03-25: 4 [IU] via SUBCUTANEOUS
  Administered 2021-03-26: 3 [IU] via SUBCUTANEOUS
  Administered 2021-03-26 (×3): 4 [IU] via SUBCUTANEOUS
  Administered 2021-03-27: 7 [IU] via SUBCUTANEOUS
  Administered 2021-03-27 – 2021-03-28 (×5): 4 [IU] via SUBCUTANEOUS
  Administered 2021-03-28: 7 [IU] via SUBCUTANEOUS
  Administered 2021-03-28: 3 [IU] via SUBCUTANEOUS
  Administered 2021-03-28: 4 [IU] via SUBCUTANEOUS
  Administered 2021-03-29 (×3): 7 [IU] via SUBCUTANEOUS
  Administered 2021-03-29: 3 [IU] via SUBCUTANEOUS

## 2021-03-17 MED ORDER — POLYETHYLENE GLYCOL 3350 17 G PO PACK: 17.0000 g | PACK | Freq: Every day | ORAL | Status: DC | PRN

## 2021-03-17 MED ORDER — SODIUM CHLORIDE 0.9 % IV SOLN
2.0000 g | INTRAVENOUS | Status: AC
  Administered 2021-03-17 – 2021-03-23 (×7): 2 g via INTRAVENOUS
  Filled 2021-03-17 (×8): qty 20

## 2021-03-17 MED ORDER — ACETAMINOPHEN 325 MG PO TABS
650.0000 mg | ORAL_TABLET | Freq: Four times a day (QID) | ORAL | Status: DC | PRN
  Administered 2021-03-17: 650 mg via ORAL
  Filled 2021-03-17: qty 2

## 2021-03-17 MED ORDER — HYDROMORPHONE HCL 1 MG/ML IJ SOLN: 0.5000 mg | INTRAMUSCULAR | Status: DC | PRN

## 2021-03-17 MED ORDER — OXYCODONE-ACETAMINOPHEN 5-325 MG PO TABS
1.0000 | ORAL_TABLET | Freq: Once | ORAL | Status: AC
  Administered 2021-03-17: 1 via ORAL
  Filled 2021-03-17: qty 1

## 2021-03-17 MED ORDER — DULOXETINE HCL 30 MG PO CPEP
30.0000 mg | ORAL_CAPSULE | Freq: Every day | ORAL | Status: DC
  Administered 2021-03-17 – 2021-03-29 (×13): 30 mg via ORAL
  Filled 2021-03-17 (×14): qty 1

## 2021-03-17 NOTE — Progress Notes (Signed)
  PROGRESS NOTE    Brett White  KJZ:[id number] DOB: Mar 30, 1961 DOA: 03/16/2021  PCP: Patient, [name]    LOS - 0    Patient admitted after midnight with left foot infection s/p recent first ray amputation, revision, debridement on 03/03/21. Presented with worsening pain, erythema and swelling of the foot with associated nausea/vomiting.  Interval subjective: pt reports pain in the Left foot but otherwise denies acute complaints.  No fever or chills.  Exam - awake, alert, NAD Heart RRR Left foot dressing intact, 2+ TP pulse Right foot 2+ DP pulse     Active Problems:   Left foot infection    I have reviewed the full H&P by Dr. Margo Aye in detail, and I agree with the assessment and plan as outlined therein. In addition:  --Dr. Lajoyce Corners consulted and case discussed --MRI left foot without contrast - ordered, follow --Defer ABI's given palpable distal pulses   No Charge    Pennie Banter, DO [facility]   If 7PM-7AM, please contact night-coverage [email or web address] 03/17/2021, 8:13 AM

## 2021-03-17 NOTE — Progress Notes (Signed)
Bedside shift report complete. Received patient awake,alert/orientedx4 and able to verbalize needs. NAD noted; respirations easy/even on room air. Movement/sensation noted to all extremities. Dressing to L foot c/d/I. IVF/ abx infusing at this time. Whiteboard updated. All safety measures in place and personal belongings within reach.

## 2021-03-17 NOTE — ED Provider Notes (Signed)
[facility] EMERGENCY DEPARTMENT Provider Note   CSN: [id number] Arrival date & time: 03/16/21  2222     History Chief Complaint  Patient presents with   Post-op Problem    Buck Mcaffee is a 60 y.o. male.  The history is provided by the patient and medical records. No language interpreter was used.  Foot Pain This is a recurrent problem. The current episode started yesterday. The problem occurs constantly. The problem has been rapidly worsening. Pertinent negatives include no chest pain, no abdominal pain, no headaches and no shortness of breath. Nothing aggravates the symptoms. Nothing relieves the symptoms. He has tried nothing for the symptoms. The treatment provided no relief.      Past Medical History:  Diagnosis Date   Diabetes mellitus without complication [facility])     Patient Active Problem List   Diagnosis Date Noted   Abscess of left foot 03/03/2021   Cutaneous abscess of left foot    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   Uncontrolled type 2 diabetes mellitus with hyperglycemia, without long-term current use of insulin (HCC) 02/03/2021   Essential hypertension 02/03/2021   Lactic acidosis 02/03/2021   Diabetic peripheral neuropathy associated with type 2 diabetes mellitus (HCC) 02/03/2021   Osteomyelitis (HCC) 02/03/2021   Cellulitis of toe of left foot     Past Surgical History:  Procedure Laterality Date   AMPUTATION Left 02/04/2021   Procedure: LEFT GREAT TOE AMPUTATION;  Surgeon: [name], MD;  Location: [facility] OR;  Service: Orthopedics;  Laterality: Left;   AMPUTATION Left 02/10/2021   Procedure: LEFT FOOT 1ST RAY AMPUTATION;  Surgeon: [name], MD;  Location: [facility] OR;  Service: Orthopedics;  Laterality: Left;   APPENDECTOMY     BACK SURGERY     I & D EXTREMITY Left 03/03/2021   Procedure: LEFT FOOT DEBRIDEMENT;  Surgeon: [name], MD;  Location: [facility] OR;  Service: Orthopedics;  Laterality: Left;    TONSILLECTOMY         Family History  Problem Relation Age of Onset   Heart attack Neg Hx     Social History   Tobacco Use   Smoking status: Never   Smokeless tobacco: Never  Substance Use Topics   Alcohol use: Not Currently   Drug use: Not Currently    Home Medications Prior to Admission medications   Medication Sig Start Date End Date Taking? Authorizing Provider  calcium carbonate (TUMS - DOSED IN MG ELEMENTAL CALCIUM) 500 MG chewable tablet Chew 1 tablet (200 mg of elemental calcium total) by mouth 3 (three) times daily. Patient not taking: No sig reported 02/17/21   Lanae Boast, MD  Calcium Carbonate Antacid (TUMS PO) Take 1 tablet by mouth 3 (three) times daily with meals.    [provider]  diphenhydramine-acetaminophen (TYLENOL PM) 25-500 MG TABS tablet Take 1 tablet by mouth at bedtime as needed (sleep/pain).    [provider]  docusate sodium (COLACE) 100 MG capsule Take 1 capsule (100 mg total) by mouth daily. 02/18/21   Lanae Boast, MD  doxycycline (VIBRA-TABS) 100 MG tablet Take 100 mg by mouth 2 (two) times daily. 02/28/21   [provider]  doxycycline (VIBRA-TABS) 100 MG tablet Take 1 tablet (100 mg total) by mouth 2 (two) times daily. 03/15/21   Persons, [place], PA  DULoxetine (CYMBALTA) 30 MG capsule Take 30 mg by mouth daily.    [provider]  gabapentin (NEURONTIN) 300 MG capsule  Take 2 capsules (600 mg total) by mouth 3 (three) times daily. Patient taking differently: Take 900 mg by mouth 3 (three) times daily. 02/17/21   Lanae Boast, MD  hydrochlorothiazide (HYDRODIURIL) 12.5 MG tablet Take 1 tablet (12.5 mg total) by mouth daily. 11/26/20   Roxy Horseman, PA-C  ibuprofen (ADVIL) 600 MG tablet Take 1 tablet (600 mg total) by mouth every 6 (six) hours as needed. Patient taking differently: Take 600 mg by mouth every 6 (six) hours as needed (pain). 11/05/20   Dartha Lodge, PA-C  LANTUS SOLOSTAR 100 UNIT/ML Solostar Pen  Inject 25 Units into the skin at bedtime. 02/19/21   [provider]  methocarbamol (ROBAXIN) 500 MG tablet Take 1 tablet (500 mg total) by mouth every 6 (six) hours as needed for muscle spasms. 02/17/21   Lanae Boast, MD  Multiple Vitamin (MULTIVITAMIN WITH MINERALS) TABS tablet Take 1 tablet by mouth daily.    [provider]  NOVOLOG FLEXPEN 100 UNIT/ML FlexPen Inject 5 Units into the skin with breakfast, with lunch, and with evening meal. 02/19/21   [provider]  ondansetron (ZOFRAN ODT) 4 MG disintegrating tablet 4mg  ODT q4 hours prn nausea/vomit Patient taking differently: Take 4 mg by mouth every 4 (four) hours as needed for vomiting or nausea. 02/21/21   02/23/21, MD  Oxycodone HCl 10 MG TABS Take 1-1.5 tablets (10-15 mg total) by mouth every 4 (four) hours as needed (pain score 7-10). 03/15/21   Persons, 03/17/21, PA  pantoprazole (PROTONIX) 40 MG tablet Take 1 tablet (40 mg total) by mouth daily. 02/18/21   02/20/21, MD  polyethylene glycol (MIRALAX / GLYCOLAX) 17 g packet Take 17 g by mouth 2 (two) times daily. Patient taking differently: Take 17 g by mouth daily. 02/17/21   02/19/21, MD  senna (SENOKOT) 8.6 MG TABS tablet Take 1 tablet by mouth 2 (two) times daily.    [provider]  zinc sulfate 220 (50 Zn) MG capsule Take 1 capsule (220 mg total) by mouth daily. 02/18/21   02/20/21, MD    Allergies    Patient has no known allergies.  Review of Systems   Review of Systems  Constitutional:  Positive for chills, fatigue and fever.  HENT:  Negative for congestion.   Eyes:  Negative for visual disturbance.  Respiratory:  Negative for chest tightness and shortness of breath.   Cardiovascular:  Negative for chest pain.  Gastrointestinal:  Positive for nausea. Negative for abdominal pain, constipation, diarrhea and vomiting.  Genitourinary:  Negative for frequency.  Musculoskeletal:  Negative for back pain.  Skin:  Positive for color  change, rash and wound.  Neurological:  Negative for light-headedness, numbness and headaches.  Psychiatric/Behavioral:  Negative for agitation.   All other systems reviewed and are negative.  Physical Exam Updated Vital Signs BP 111/60 (BP Location: Left Arm)   Pulse 88   Temp 98 F (36.7 C) (Oral)   Resp 19   SpO2 99%   Physical Exam Vitals and nursing note reviewed.  Constitutional:      General: He is not in acute distress.    Appearance: He is well-developed. He is not ill-appearing, toxic-appearing or diaphoretic.  HENT:     Head: Normocephalic and atraumatic.     Mouth/Throat:     Mouth: Mucous membranes are moist.  Eyes:     Conjunctiva/sclera: Conjunctivae normal.  Cardiovascular:     Rate and Rhythm: Normal rate and regular rhythm.  Heart sounds: No murmur heard. Pulmonary:     Effort: Pulmonary effort is normal. No respiratory distress.     Breath sounds: Normal breath sounds. No wheezing, rhonchi or rales.  Chest:     Chest wall: No tenderness.  Abdominal:     Palpations: Abdomen is soft.     Tenderness: There is no abdominal tenderness. There is no guarding or rebound.  Musculoskeletal:        General: Swelling and tenderness present.     Cervical back: Neck supple.     Left foot: Tenderness present.       Legs:  Skin:    General: Skin is warm and dry.     Capillary Refill: Capillary refill takes less than 2 seconds.     Findings: Erythema present.  Neurological:     Mental Status: He is alert. Mental status is at baseline.  Psychiatric:        Mood and Affect: Mood normal.      ED Results / Procedures / Treatments   Labs (all labs ordered are listed, but only abnormal results are displayed) Labs Reviewed  LACTIC ACID, PLASMA - Abnormal; Notable for the following components:      Result Value   Lactic Acid, Venous 2.4 (*)    All other components within normal limits  COMPREHENSIVE METABOLIC PANEL - Abnormal; Notable for the following  components:   Glucose, Bld 240 (*)    BUN 39 (*)    Creatinine, Ser 1.41 (*)    Total Protein 8.3 (*)    GFR, Estimated 58 (*)    All other components within normal limits  CULTURE, BLOOD (ROUTINE X 2)  CULTURE, BLOOD (ROUTINE X 2)  CBC WITH DIFFERENTIAL/PLATELET    EKG None  Radiology DG Foot Complete Left  Result Date: 03/17/2021 CLINICAL DATA:  Recent amputation with worsening infection and bleeding. EXAM: LEFT FOOT - COMPLETE 3+ VIEW COMPARISON:  03/02/2021 FINDINGS: Postoperative changes with amputation of the left first ray at the tarsometatarsal level. Foreign material in the postoperative soft tissues consistent with packing material. No radiopaque soft tissue gas collections. Bones appear unchanged since prior study. Degenerative changes in the intertarsal joints. IMPRESSION: Postoperative amputation of the left first ray at the tarsometatarsal level. No significant changes in appearance since previous study. Electronically Signed   By: Burman NievesWilliam  Stevens M.D.   On: 03/17/2021 00:45    Procedures Procedures   CRITICAL CARE Performed by: Canary Brimhristopher J Saleh Ulbrich Total critical care time: 35 minutes Critical care time was exclusive of separately billable procedures and treating other patients. Critical care was necessary to treat or prevent imminent or life-threatening deterioration. Critical care was time spent personally by me on the following activities: development of treatment plan with patient and/or surrogate as well as nursing, discussions with consultants, evaluation of patient's response to treatment, examination of patient, obtaining history from patient or surrogate, ordering and performing treatments and interventions, ordering and review of laboratory studies, ordering and review of radiographic studies, pulse oximetry and re-evaluation of patient's condition.  Medications Ordered in ED Medications  cefTRIAXone (ROCEPHIN) 2 g in sodium chloride 0.9 % 100 mL IVPB (0 g  Intravenous Stopped 03/17/21 0529)  metroNIDAZOLE (FLAGYL) IVPB 500 mg (0 mg Intravenous Stopped 03/17/21 0727)  HYDROmorphone (DILAUDID) injection 1 mg (1 mg Intravenous Given 03/17/21 0713)  senna-docusate (Senokot-S) tablet 2 tablet (has no administration in time range)  polyethylene glycol (MIRALAX / GLYCOLAX) packet 17 g (has no administration in time range)  acetaminophen (  TYLENOL) tablet 650 mg (has no administration in time range)  prochlorperazine (COMPAZINE) injection 5 mg (has no administration in time range)  lactated ringers infusion ( Intravenous New Bag/Given 03/17/21 0608)  insulin aspart (novoLOG) injection 0-20 Units (4 Units Subcutaneous Given 03/17/21 0822)  insulin glargine (LANTUS) injection 5 Units (has no administration in time range)  DULoxetine (CYMBALTA) DR capsule 30 mg (30 mg Oral Given 03/17/21 0819)  gabapentin (NEURONTIN) capsule 300 mg (300 mg Oral Given 03/17/21 0818)  zinc sulfate capsule 220 mg (220 mg Oral Given 03/17/21 0818)  pantoprazole (PROTONIX) EC tablet 40 mg (40 mg Oral Given 03/17/21 0818)  vancomycin (VANCOREADY) IVPB 1250 mg/250 mL (has no administration in time range)  oxyCODONE-acetaminophen (PERCOCET/ROXICET) 5-325 MG per tablet 1 tablet (1 tablet Oral Given 03/17/21 0146)  morphine 4 MG/ML injection 4 mg (4 mg Intravenous Given 03/17/21 0501)  vancomycin (VANCOREADY) IVPB 2000 mg/400 mL (0 mg Intravenous Stopped 03/17/21 1017)    ED Course  I have reviewed the triage vital signs and the nursing notes.  Pertinent labs & imaging results that were available during my care of the patient were reviewed by me and considered in my medical decision making (see chart for details).    MDM Rules/Calculators/A&P                          Danner Paulding is a 60 y.o. male with a past medical history significant for hypertension, diabetes, and recent left great toe osteomyelitis status post amputation who presents with worsening foot pain and concern for  worsening infection.  According to patient, he had a left foot surgery with orthopedics  around 2 weeks ago and then several days later had worsening osteomyelitis and had to have a revision.  He reports that for the last 2 days it has been  worsening with redness, warmth, tenderness, swelling, pain, and some drainage and bleeding and he is concerned about recurrent bone infection.  He reports some subjective fevers and chills and had some nausea and vomiting.  He is feeling worse.  On exam, lungs clear and chest nontender.  Abdomen nontender.  Patient has a erythematous wound on his left foot that is tender.  There is no crepitance.  Intact strength in the other toes.  Clinically it appears infected with cellulitis.  Patient had imaging and labs in triage which do show Lactic acid elevated.  X-ray did not show osteomyelitis but clinically we are concerned about this.  AKI present now.  Patient given antibiotics and will be admitted for further management and likely reassessment by his orthopedics team.  Patient admitted for further management.      Final Clinical Impression(s) / ED Diagnoses Final diagnoses:  Left foot infection    Clinical Impression: 1. Left foot infection     Disposition: Admit  This note was prepared with assistance of Dragon voice recognition software. Occasional wrong-word or sound-a-like substitutions may have occurred due to the inherent limitations of voice recognition software.     Addilynne Olheiser, Canary Brim, MD 03/17/21 [phone]

## 2021-03-17 NOTE — Plan of Care (Signed)
  Problem: Clinical Measurements: Goal: Diagnostic test results will improve Outcome: Progressing   Problem: Elimination: Goal: Will not experience complications related to bowel motility Outcome: Progressing   

## 2021-03-17 NOTE — Progress Notes (Addendum)
Pharmacy Antibiotic Note  Brett White is a 60 y.o. male admitted on 03/16/2021 with L foot pain/possible osteomyelitis.  Pharmacy has been consulted for Vancomycin  dosing.     Renal function improving from SCr 1.41>0.9 back to baseline, currently on vancomycin 1250 mg IV q 12h with eAUC <400  Plan: Adjust vancomycin to 1500 mg IV q 12h (eAUC 465, Goal AUC 400-550 Monitor renal function, ortho plans for possible amputation next week and LOT Vancomycin levels as indicated     Temp (24hrs), Avg:98.1 F (36.7 C), Min:97.7 F (36.5 C), Max:98.4 F (36.9 C)  Recent Labs  Lab 03/16/21 2358 03/17/21 0000  WBC 7.8  --   CREATININE 1.41*  --   LATICACIDVEN  --  2.4*    Estimated Creatinine Clearance: 69.1 mL/min (A) (by C-G formula based on SCr of 1.41 mg/dL (H)).    No Known Allergies    Daylene Posey, PharmD Clinical Pharmacist ED Pharmacist Phone # [phone] 03/18/2021 11:23 AM

## 2021-03-17 NOTE — Consult Note (Signed)
ORTHOPAEDIC CONSULTATION  REQUESTING PHYSICIAN: Pennie Banter, DO  Chief Complaint: Pain left foot with episodes of nausea and vomiting at home.  HPI: Brett White is a 60 y.o. male who presents with persistent left foot pain status post first ray amputation and irrigation and debridement.  Patient states that at home his pain was worse and that he had 2 episodes of nausea and vomiting.  Past Medical History:  Diagnosis Date   Diabetes mellitus without complication [facility])    Past Surgical History:  Procedure Laterality Date   AMPUTATION Left 02/04/2021   Procedure: LEFT GREAT TOE AMPUTATION;  Surgeon: [name], MD;  Location: [facility] OR;  Service: Orthopedics;  Laterality: Left;   AMPUTATION Left 02/10/2021   Procedure: LEFT FOOT 1ST RAY AMPUTATION;  Surgeon: [name], MD;  Location: [facility] OR;  Service: Orthopedics;  Laterality: Left;   APPENDECTOMY     BACK SURGERY     I & D EXTREMITY Left 03/03/2021   Procedure: LEFT FOOT DEBRIDEMENT;  Surgeon: [name], MD;  Location: [facility] OR;  Service: Orthopedics;  Laterality: Left;   TONSILLECTOMY     Social History   Socioeconomic History   Marital status: Single    Spouse name: Not on file   Number of children: Not on file   Years of education: Not on file   Highest education level: Not on file  Occupational History   Not on file  Tobacco Use   Smoking status: Never   Smokeless tobacco: Never  Substance and Sexual Activity   Alcohol use: Not Currently   Drug use: Not Currently   Sexual activity: Not on file  Other Topics Concern   Not on file  Social History Narrative   Not on file   Social Determinants of Health   Financial Resource Strain: Not on file  Food Insecurity: Not on file  Transportation Needs: Not on file  Physical Activity: Not on file  Stress: Not on file  Social Connections: Not on file   Family History  Problem Relation Age of Onset   Heart attack Neg Hx    - negative except otherwise  stated in the family history section No Known Allergies Prior to Admission medications   Medication Sig Start Date End Date Taking? Authorizing Provider  calcium carbonate (TUMS - DOSED IN MG ELEMENTAL CALCIUM) 500 MG chewable tablet Chew 1 tablet (200 mg of elemental calcium total) by mouth 3 (three) times daily. Patient not taking: No sig reported 02/17/21   Lanae Boast, MD  Calcium Carbonate Antacid (TUMS PO) Take 1 tablet by mouth 3 (three) times daily with meals.    [provider]  diphenhydramine-acetaminophen (TYLENOL PM) 25-500 MG TABS tablet Take 1 tablet by mouth at bedtime as needed (sleep/pain).    [provider]  docusate sodium (COLACE) 100 MG capsule Take 1 capsule (100 mg total) by mouth daily. 02/18/21   Lanae Boast, MD  doxycycline (VIBRA-TABS) 100 MG tablet Take 100 mg by mouth 2 (two) times daily. 02/28/21   [provider]  doxycycline (VIBRA-TABS) 100 MG tablet Take 1 tablet (100 mg total) by mouth 2 (two) times daily. 03/15/21   Persons, [place], PA  DULoxetine (CYMBALTA) 30 MG capsule Take 30 mg by mouth daily.    [provider]  gabapentin (NEURONTIN) 300 MG capsule Take 2 capsules (600 mg total) by mouth 3 (three) times daily. Patient taking differently: Take 900 mg by mouth 3 (three) times daily.  02/17/21   Lanae Boast, MD  hydrochlorothiazide (HYDRODIURIL) 12.5 MG tablet Take 1 tablet (12.5 mg total) by mouth daily. 11/26/20   Roxy Horseman, PA-C  ibuprofen (ADVIL) 600 MG tablet Take 1 tablet (600 mg total) by mouth every 6 (six) hours as needed. Patient taking differently: Take 600 mg by mouth every 6 (six) hours as needed (pain). 11/05/20   Dartha Lodge, PA-C  LANTUS SOLOSTAR 100 UNIT/ML Solostar Pen Inject 25 Units into the skin at bedtime. 02/19/21   [provider]  methocarbamol (ROBAXIN) 500 MG tablet Take 1 tablet (500 mg total) by mouth every 6 (six) hours as needed for muscle spasms. 02/17/21   Lanae Boast, MD  Multiple  Vitamin (MULTIVITAMIN WITH MINERALS) TABS tablet Take 1 tablet by mouth daily.    [provider]  NOVOLOG FLEXPEN 100 UNIT/ML FlexPen Inject 5 Units into the skin with breakfast, with lunch, and with evening meal. 02/19/21   [provider]  ondansetron (ZOFRAN ODT) 4 MG disintegrating tablet 4mg  ODT q4 hours prn nausea/vomit Patient taking differently: Take 4 mg by mouth every 4 (four) hours as needed for vomiting or nausea. 02/21/21   02/23/21, MD  Oxycodone HCl 10 MG TABS Take 1-1.5 tablets (10-15 mg total) by mouth every 4 (four) hours as needed (pain score 7-10). 03/15/21   Persons, 03/17/21, PA  pantoprazole (PROTONIX) 40 MG tablet Take 1 tablet (40 mg total) by mouth daily. 02/18/21   02/20/21, MD  polyethylene glycol (MIRALAX / GLYCOLAX) 17 g packet Take 17 g by mouth 2 (two) times daily. Patient taking differently: Take 17 g by mouth daily. 02/17/21   02/19/21, MD  senna (SENOKOT) 8.6 MG TABS tablet Take 1 tablet by mouth 2 (two) times daily.    [provider]  zinc sulfate 220 (50 Zn) MG capsule Take 1 capsule (220 mg total) by mouth daily. 02/18/21   02/20/21, MD   DG Foot Complete Left  Result Date: 03/17/2021 CLINICAL DATA:  Recent amputation with worsening infection and bleeding. EXAM: LEFT FOOT - COMPLETE 3+ VIEW COMPARISON:  03/02/2021 FINDINGS: Postoperative changes with amputation of the left first ray at the tarsometatarsal level. Foreign material in the postoperative soft tissues consistent with packing material. No radiopaque soft tissue gas collections. Bones appear unchanged since prior study. Degenerative changes in the intertarsal joints. IMPRESSION: Postoperative amputation of the left first ray at the tarsometatarsal level. No significant changes in appearance since previous study. Electronically Signed   By: 05/02/2021 M.D.   On: 03/17/2021 00:45   - pertinent xrays, CT, MRI studies were reviewed and independently  interpreted  Positive ROS: All other systems have been reviewed and were otherwise negative with the exception of those mentioned in the HPI and as above.  Physical Exam: General: Alert, no acute distress Psychiatric: Patient is competent for consent with normal mood and affect Lymphatic: No axillary or cervical lymphadenopathy Cardiovascular: No pedal edema Respiratory: No cyanosis, no use of accessory musculature GI: No organomegaly, abdomen is soft and non-tender    Images:  @ENCIMAGES @  Labs:  Lab Results  Component Value Date   HGBA1C 11.5 (H) 02/03/2021   ESRSEDRATE 34 (H) 03/17/2021   CRP <0.5 03/17/2021   CRP 1.0 (H) 02/08/2021   CRP 0.8 02/05/2021   REPTSTATUS 03/08/2021 FINAL 03/03/2021   GRAMSTAIN  03/03/2021    RARE WBC PRESENT, PREDOMINANTLY MONONUCLEAR NO ORGANISMS SEEN    CULT  03/03/2021    RARE  METHICILLIN RESISTANT STAPHYLOCOCCUS AUREUS NO ANAEROBES ISOLATED Performed at [facility], [address] [place], [place] [address]    LABORGA METHICILLIN RESISTANT STAPHYLOCOCCUS AUREUS 03/03/2021    Lab Results  Component Value Date   ALBUMIN 3.8 03/16/2021   ALBUMIN 2.8 (L) 02/21/2021   ALBUMIN 3.5 02/03/2021     CBC EXTENDED Latest Ref Rng & Units 03/17/2021 03/16/2021 03/05/2021  WBC 4.0 - 10.5 K/uL 7.0 7.8 8.0  RBC 4.22 - 5.81 MIL/uL 4.42 4.70 3.78(L)  HGB 13.0 - 17.0 g/dL 12.4(L) 13.4 10.7(L)  HCT 39.0 - 52.0 % 38.3(L) 41.5 33.5(L)  PLT 150 - 400 K/uL 295 349 304  NEUTROABS 1.7 - 7.7 K/uL - 5.8 -  LYMPHSABS 0.7 - 4.0 K/uL - 1.3 -    Neurologic: Patient does not have protective sensation bilateral lower extremities.   MUSCULOSKELETAL:   Skin: Examination there is no redness or cellulitis the surgical incision is well-healed there is a small amount of serous sanguinous drainage.  There is no fluctuance patient is globally tender to palpation around the foot.  There is good epithelization of the incision.  Patient's white blood cell  count has improved from 2 weeks ago and is currently 7 previously was 8.  His hemoglobin is also improved its currently 12.4 previously 10.7.  His albumin has improved is currently 3.8 previously 2.8.  Patient's hemoglobin A1c is 11.5 with uncontrolled type 2 diabetes.  Most recent sed rate is 34 with a normal C-reactive protein of less than 0.5.  Patient has a palpable pulse no ascending cellulitis.  Assessment: Left foot pain healing well status post first ray amputation with no clinical or laboratory diagnostic studies consistent with infection.  Plan: Discussed with the patient that if his pain does not resolve and he cannot perform activities of daily living due to pain with weightbearing I discussed that his next surgical option would be to proceed with a transtibial amputation.  Patient states that if that is what needs to happen he would like to proceed.  I will reevaluate on Monday.  Thank you for the consult and the opportunity to see Brett White  Aldean Baker, MD [facility] [phone] 6:26 PM

## 2021-03-17 NOTE — H&P (Addendum)
History and Physical  Brett White HYI:[id number] DOB: May 06, 1961 DOA: 03/16/2021  Referring physician: Dr. Rush Landmark, EDP. PCP: Patient, [name]  Outpatient Specialists: Orthopedic surgery, Dr. Lajoyce Corners.  Patient coming from: Home.    Chief Complaint: Severe left foot pain.  HPI: Brett White is a 60 y.o. male with medical history significant for Left great toe osteomyelitis, left foot abscess status post revision first ray amputation and debridement on 03/03/2021, type 2 diabetes, diabetic polyneuropathy, essential hypertension, chronic anxiety/depression, GERD, who presented to [facility] ED due to worsening severe left foot pain of 5 days duration.  Associated with erythema, edema for the past 2 days.  On the day of presentation he reports intractable nausea and vomiting x4 times nonbloody emesis, could not keep anything down.  He presented to the ED for further evaluation.  Work-up in the ED revealed postoperative amputation of the left first ray at the tarsometatarsal level, no significant change in the appearance since previous study.  Due to concern for recurrent osteomyelitis versus cellulitis and AKI.  He was started on IV antibiotics empirically, IV fluid hydration and pain control.  TRH, hospitalist team, was asked to admit.  ED Course:  Temperature 98.4.  BP 140/84, pulse 83, respiratory 18, O2 saturation 98% on room air.  Lab studies remarkable for serum sodium 137, potassium 4.2, serum glucose 240, BUN 39, creatinine 1.41, anion gap 12, serum bicarb 27, GFR 58.  Lactic acid 2.4.  Review of Systems: Review of systems as noted in the HPI. All other systems reviewed and are negative.   Past Medical History:  Diagnosis Date   Diabetes mellitus without complication [facility])    Past Surgical History:  Procedure Laterality Date   AMPUTATION Left 02/04/2021   Procedure: LEFT GREAT TOE AMPUTATION;  Surgeon: [name], MD;  Location: [facility] OR;  Service: Orthopedics;  Laterality: Left;    AMPUTATION Left 02/10/2021   Procedure: LEFT FOOT 1ST RAY AMPUTATION;  Surgeon: [name], MD;  Location: [facility] OR;  Service: Orthopedics;  Laterality: Left;   APPENDECTOMY     BACK SURGERY     I & D EXTREMITY Left 03/03/2021   Procedure: LEFT FOOT DEBRIDEMENT;  Surgeon: [name], MD;  Location[facility] OR;  Service: Orthopedics;  Laterality: Left;   TONSILLECTOMY      Social History:  reports that he has never smoked. He has never used smokeless tobacco. He reports previous alcohol use. He reports previous drug use.   No Known Allergies  Family History  Problem Relation Age of Onset   Heart attack Neg Hx       Prior to Admission medications   Medication Sig Start Date End Date Taking? Authorizing Provider  calcium carbonate (TUMS - DOSED IN MG ELEMENTAL CALCIUM) 500 MG chewable tablet Chew 1 tablet (200 mg of elemental calcium total) by mouth 3 (three) times daily. Patient not taking: No sig reported 02/17/21   Lanae Boast, MD  Calcium Carbonate Antacid (TUMS PO) Take 1 tablet by mouth 3 (three) times daily with meals.    [provider]  diphenhydramine-acetaminophen (TYLENOL PM) 25-500 MG TABS tablet Take 1 tablet by mouth at bedtime as needed (sleep/pain).    [provider]  docusate sodium (COLACE) 100 MG capsule Take 1 capsule (100 mg total) by mouth daily. 02/18/21   Lanae Boast, MD  doxycycline (VIBRA-TABS) 100 MG tablet Take 100 mg by mouth 2 (two) times daily. 02/28/21   [provider]  doxycycline (VIBRA-TABS) 100  MG tablet Take 1 tablet (100 mg total) by mouth 2 (two) times daily. 03/15/21   Persons, [place], PA  DULoxetine (CYMBALTA) 30 MG capsule Take 30 mg by mouth daily.    [provider]  gabapentin (NEURONTIN) 300 MG capsule Take 2 capsules (600 mg total) by mouth 3 (three) times daily. Patient taking differently: Take 900 mg by mouth 3 (three) times daily. 02/17/21   Lanae Boast, MD  hydrochlorothiazide (HYDRODIURIL) 12.5 MG  tablet Take 1 tablet (12.5 mg total) by mouth daily. 11/26/20   Roxy Horseman, PA-C  ibuprofen (ADVIL) 600 MG tablet Take 1 tablet (600 mg total) by mouth every 6 (six) hours as needed. Patient taking differently: Take 600 mg by mouth every 6 (six) hours as needed (pain). 11/05/20   Dartha Lodge, PA-C  LANTUS SOLOSTAR 100 UNIT/ML Solostar Pen Inject 25 Units into the skin at bedtime. 02/19/21   [provider]  methocarbamol (ROBAXIN) 500 MG tablet Take 1 tablet (500 mg total) by mouth every 6 (six) hours as needed for muscle spasms. 02/17/21   Lanae Boast, MD  Multiple Vitamin (MULTIVITAMIN WITH MINERALS) TABS tablet Take 1 tablet by mouth daily.    [provider]  NOVOLOG FLEXPEN 100 UNIT/ML FlexPen Inject 5 Units into the skin with breakfast, with lunch, and with evening meal. 02/19/21   [provider]  ondansetron (ZOFRAN ODT) 4 MG disintegrating tablet 4mg  ODT q4 hours prn nausea/vomit Patient taking differently: Take 4 mg by mouth every 4 (four) hours as needed for vomiting or nausea. 02/21/21   02/23/21, MD  Oxycodone HCl 10 MG TABS Take 1-1.5 tablets (10-15 mg total) by mouth every 4 (four) hours as needed (pain score 7-10). 03/15/21   Persons, 03/17/21, PA  pantoprazole (PROTONIX) 40 MG tablet Take 1 tablet (40 mg total) by mouth daily. 02/18/21   02/20/21, MD  polyethylene glycol (MIRALAX / GLYCOLAX) 17 g packet Take 17 g by mouth 2 (two) times daily. Patient taking differently: Take 17 g by mouth daily. 02/17/21   02/19/21, MD  senna (SENOKOT) 8.6 MG TABS tablet Take 1 tablet by mouth 2 (two) times daily.    [provider]  zinc sulfate 220 (50 Zn) MG capsule Take 1 capsule (220 mg total) by mouth daily. 02/18/21   02/20/21, MD    Physical Exam: BP 140/84 (BP Location: Left Arm)   Pulse 83   Temp 98.4 F (36.9 C) (Oral)   Resp 18   SpO2 98%   General: 60 y.o. year-old male well developed well nourished in no acute distress.  Alert  and oriented x3. Cardiovascular: Regular rate and rhythm with no rubs or gallops.  No thyromegaly or JVD noted.  Trace lower extremity edema. 2/4 pulses in all 4 extremities. Respiratory: Clear to auscultation with no wheezes or rales. Good inspiratory effort. Abdomen: Soft nontender nondistended with normal bowel sounds x4 quadrants. Muskuloskeletal: No cyanosis or clubbing.  Trace edema in lower extremities bilaterally. Neuro: CN II-XII intact, strength, sensation, reflexes Skin: Post first ray amputation, site appears edematous, erythematous, tender with palpation. Psychiatry: Judgement and insight appear normal. Mood is appropriate for condition and setting          Labs on Admission:  Basic Metabolic Panel: Recent Labs  Lab 03/16/21 2358  NA 137  K 4.2  CL 98  CO2 27  GLUCOSE 240*  BUN 39*  CREATININE 1.41*  CALCIUM 9.6   Liver Function Tests: Recent  Labs  Lab 03/16/21 2358  AST 23  ALT 29  ALKPHOS 75  BILITOT 0.4  PROT 8.3*  ALBUMIN 3.8   No results for input(s): LIPASE, AMYLASE in the last 168 hours. No results for input(s): AMMONIA in the last 168 hours. CBC: Recent Labs  Lab 03/16/21 2358  WBC 7.8  NEUTROABS 5.8  HGB 13.4  HCT 41.5  MCV 88.3  PLT 349   Cardiac Enzymes: No results for input(s): CKTOTAL, CKMB, CKMBINDEX, TROPONINI in the last 168 hours.  BNP (last 3 results) No results for input(s): BNP in the last 8760 hours.  ProBNP (last 3 results) No results for input(s): PROBNP in the last 8760 hours.  CBG: Recent Labs  Lab 03/14/21 1148 03/14/21 1629 03/14/21 2104 03/15/21 0815 03/15/21 1159  GLUCAP 118* 133* 208* 173* 126*    Radiological Exams on Admission: DG Foot Complete Left  Result Date: 03/17/2021 CLINICAL DATA:  Recent amputation with worsening infection and bleeding. EXAM: LEFT FOOT - COMPLETE 3+ VIEW COMPARISON:  03/02/2021 FINDINGS: Postoperative changes with amputation of the left first ray at the tarsometatarsal level.  Foreign material in the postoperative soft tissues consistent with packing material. No radiopaque soft tissue gas collections. Bones appear unchanged since prior study. Degenerative changes in the intertarsal joints. IMPRESSION: Postoperative amputation of the left first ray at the tarsometatarsal level. No significant changes in appearance since previous study. Electronically Signed   By: Burman NievesWilliam  Stevens M.D.   On: 03/17/2021 00:45    EKG: I independently viewed the EKG done and my findings are as followed: None available at the time of this visit.  Assessment/Plan Present on Admission:  Left foot infection  Active Problems:   Left foot infection  Left foot infection status post first ray amputation, revision, debridement on 03/03/2021.  Concern for recurrent osteomyelitis Obtain sed rate and CRP, follow results. Follow blood cultures x2. Continue IV antibiotics empirically, Rocephin 2 g daily, IV Flagyl, IV vancomycin. Obtain MRSA screening Please consult orthopedic surgery, Dr. Lajoyce Cornersuda in the morning. Monitor fever curve and WBC. Pain control.  AKI, likely prerenal in the setting of dehydration from nausea vomiting and poor oral intake. Baseline creatinine 1.0 GFR greater than 60.  Presented with creatinine of 1.41 with GFR 58. Avoid nephrotoxic agents, dehydration, hypotension. Gentle IV fluid hydration LR at 75 cc/h x 2 days. Monitor urine output Repeat renal function in the morning.  Intractable nausea and vomiting Onset on the day of presentation. Gentle IV fluid hydration IV antiemetics as needed. N.p.o. until seen by orthopedic surgery.  Type 2 diabetes with hyperglycemia Last hemoglobin A1c 11.5 on 02/03/2021 Patient is currently n.p.o. until seen by orthopedic surgery. Lantus 5 unit twice daily Insulin sliding scale, resistant scale  Diabetes polyneuropathy Resume home gabapentin at lower dose  Chronic anxiety/depression Resume home duloxetine.  GERD Resume home  PPI  Opioid-induced constipation Senokot scheduled MiraLAX as needed for mild to moderate constipation  Ambulatory dysfunction PT OT to assess with orthopedic surgery guidance. Fall precautions   DVT prophylaxis: SCDs.  Defer to orthopedic surgery to start pharmacological DVT prophylaxis.  Code Status: Full code.  Family Communication: None at bedside.  Disposition Plan: Admit to telemetry surgical.  Consults called: Please consult orthopedic surgery Dr. Lajoyce Cornersuda in the morning.  Admission status: Inpatient status.  Patient will require at least 2 midnights for further evaluation and treatment of present conditions.   Status is: Inpatient    Dispo:  Patient From: Home  Planned Disposition: Home, possibly on  03/19/2021: Orthopedic surgery signs off.  Medically stable for discharge: No         Darlin Drop MD [facility] Pager [phone]  If 7PM-7AM, please contact night-coverage [email or web address] Password [facility]  03/17/2021, 4:47 AM

## 2021-03-18 ENCOUNTER — Inpatient Hospital Stay (HOSPITAL_COMMUNITY): Payer: Medicare HMO

## 2021-03-18 LAB — BASIC METABOLIC PANEL
Anion gap: 7 (ref 5–15)
BUN: 31 mg/dL — ABNORMAL HIGH (ref 6–20)
CO2: 26 mmol/L (ref 22–32)
Calcium: 8.9 mg/dL (ref 8.9–10.3)
Chloride: 103 mmol/L (ref 98–111)
Creatinine, Ser: 0.9 mg/dL (ref 0.61–1.24)
GFR, Estimated: >60 mL/min (ref >60)
Glucose, Bld: 137 mg/dL — ABNORMAL HIGH (ref 70–99)
Potassium: 3.9 mmol/L (ref 3.5–5.1)
Sodium: 136 mmol/L (ref 135–145)

## 2021-03-18 LAB — GLUCOSE, CAPILLARY
Glucose-Capillary: 141 mg/dL — ABNORMAL HIGH (ref 70–99)
Glucose-Capillary: 142 mg/dL — ABNORMAL HIGH (ref 70–99)
Glucose-Capillary: 174 mg/dL — ABNORMAL HIGH (ref 70–99)
Glucose-Capillary: 177 mg/dL — ABNORMAL HIGH (ref 70–99)
Glucose-Capillary: 228 mg/dL — ABNORMAL HIGH (ref 70–99)
Glucose-Capillary: 237 mg/dL — ABNORMAL HIGH (ref 70–99)

## 2021-03-18 LAB — CBC
HCT: 36.1 % — ABNORMAL LOW (ref 39.0–52.0)
Hemoglobin: 11.7 g/dL — ABNORMAL LOW (ref 13.0–17.0)
MCH: 28.6 pg (ref 26.0–34.0)
MCHC: 32.4 g/dL (ref 30.0–36.0)
MCV: 88.3 fL (ref 80.0–100.0)
Platelets: 265 10*3/uL (ref 150–400)
RBC: 4.09 MIL/uL — ABNORMAL LOW (ref 4.22–5.81)
RDW: 13.1 % (ref 11.5–15.5)
WBC: 4.8 10*3/uL (ref 4.0–10.5)
nRBC: 0 % (ref 0.0–0.2)

## 2021-03-18 IMAGING — MR MR FOOT*L* W/O CM
6 series · 40 of 40 positions shown · non-contrast
Comparison: X-ray [DATE]

CLINICAL DATA: Diabetic foot infection. History of first ray
amputation and debridement on [DATE]

EXAM:
MRI OF THE LEFT FOOT WITHOUT CONTRAST
TECHNIQUE: Multiplanar, multisequence MR imaging of the left forefoot was
performed. No intravenous contrast was administered.

[Series 4: T1 · coronal · left · 3.0mm · 0.47mm/px · 6 of 48 slices shown (1 of 3)]
[im 1/48]
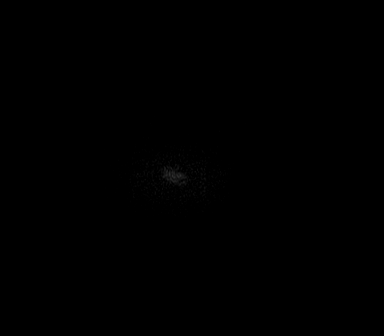
[im 10/48]
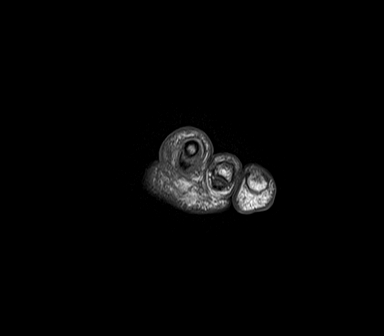
[im 19/48]
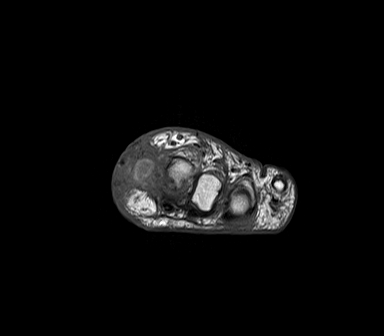
[im 29/48]
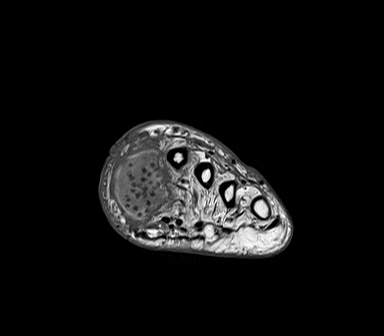
[im 38/48]
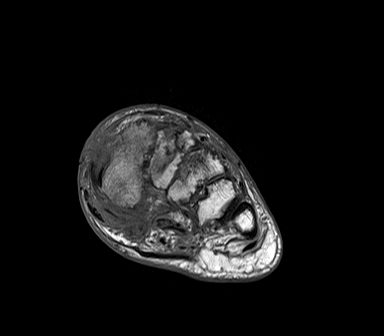
[im 48/48]
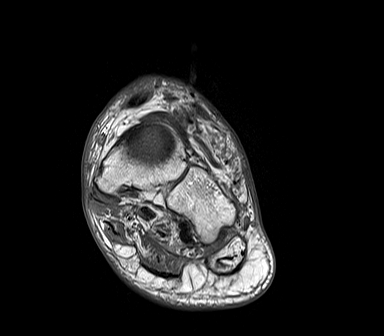

[Series 5: T2 fat-sat · coronal · left · 3.0mm · 0.49mm/px · 7 of 48 slices shown (1 of 2)]
[im 1/48]
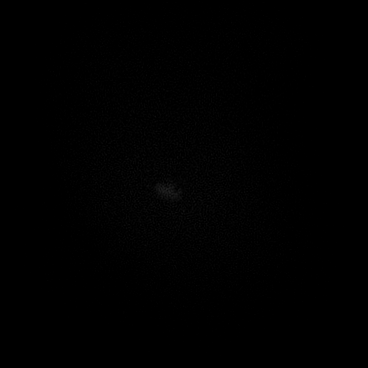
[im 8/48]
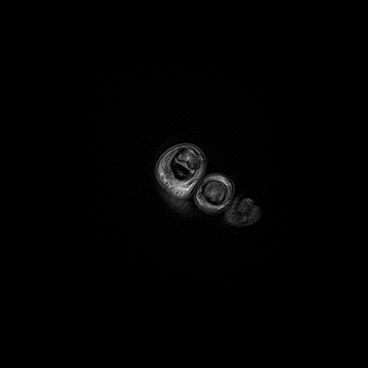
[im 16/48]
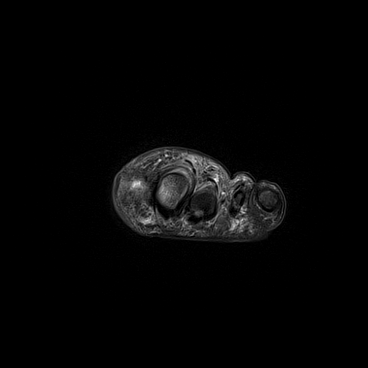
[im 24/48]
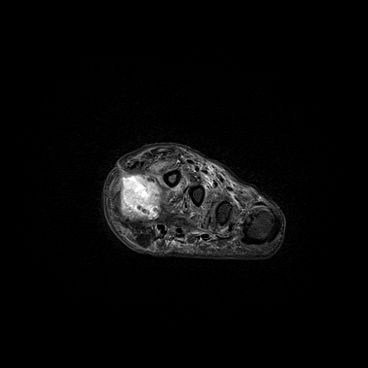
[im 32/48]
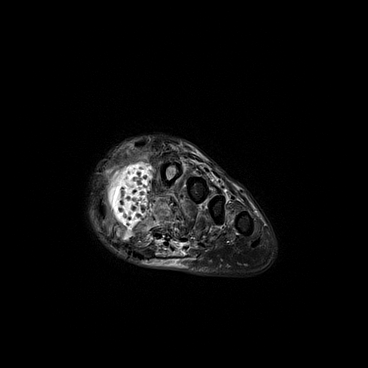
[im 40/48]
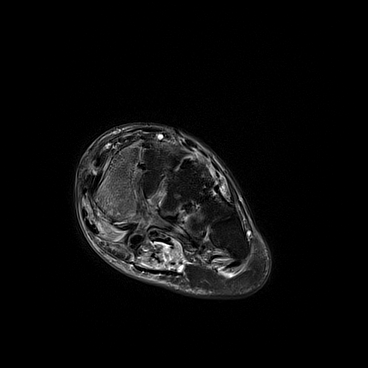
[im 48/48]
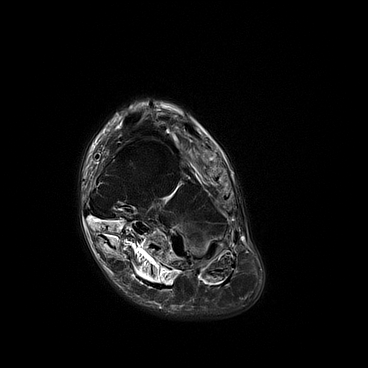

[Series 6: T1 · axial · left · 3.0mm · 0.52mm/px · z∈[-156,+12]mm · 7 of 48 slices shown (2 of 3)]
[im 1/48]
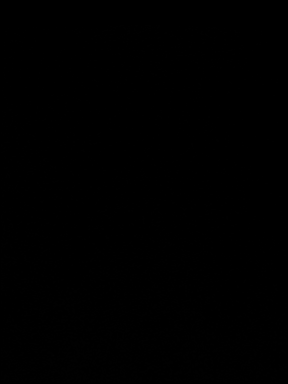
[im 8/48]
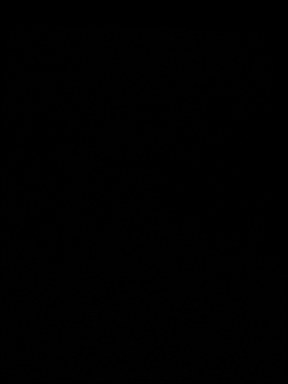
[im 16/48]
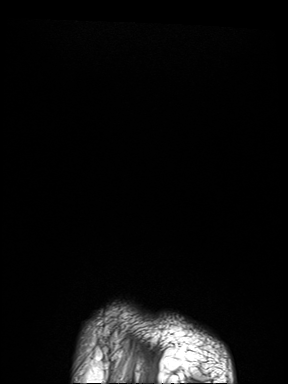
[im 24/48]
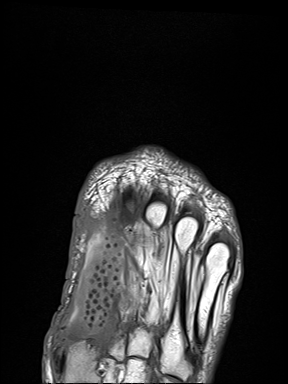
[im 32/48]
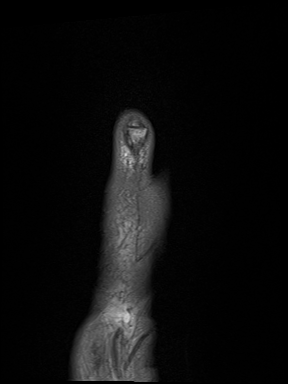
[im 40/48]
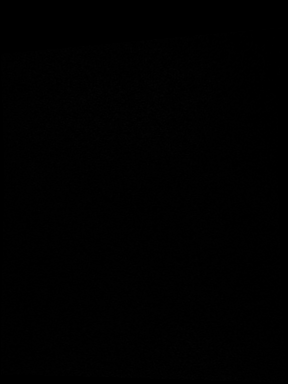
[im 48/48]
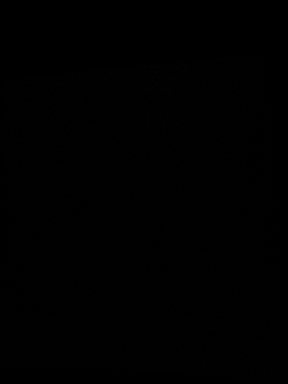

[Series 7: T1 · axial · left · 3.0mm · 0.52mm/px · z∈[-138,+22]mm · 7 of 48 slices shown (3 of 3)]
[im 1/48]
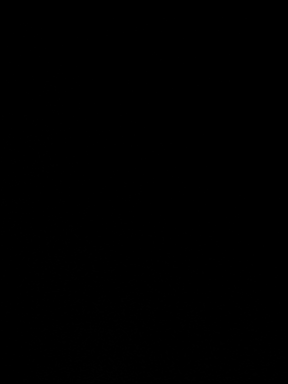
[im 8/48]
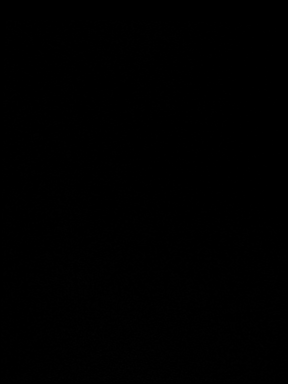
[im 16/48]
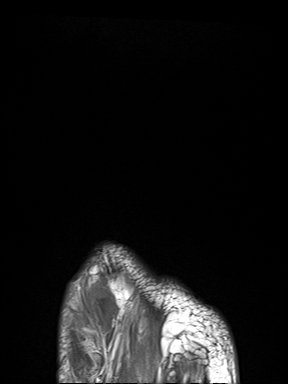
[im 24/48]
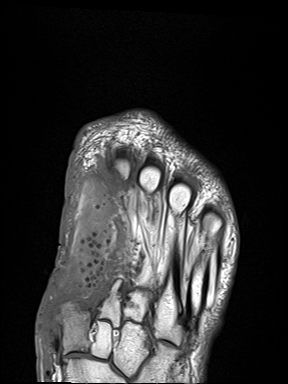
[im 32/48]
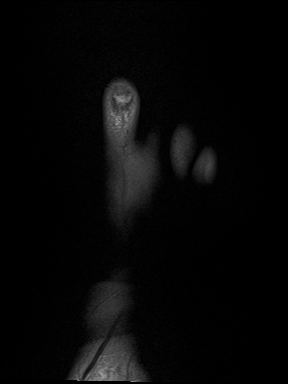
[im 40/48]
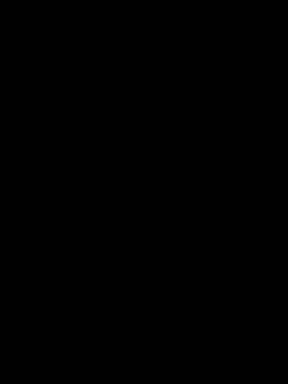
[im 48/48]
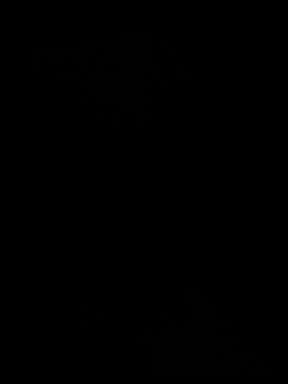

[Series 8: T2 fat-sat · axial · left · 3.0mm · 0.54mm/px · z∈[-146,+22]mm · 7 of 48 slices shown (2 of 2)]
[im 1/48]
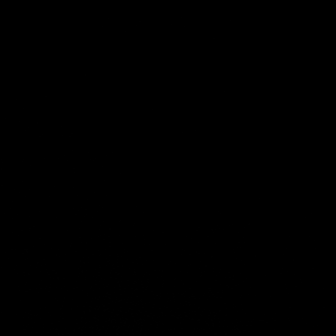
[im 8/48]
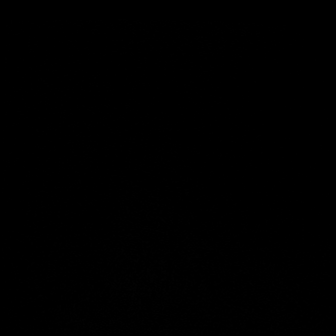
[im 16/48]
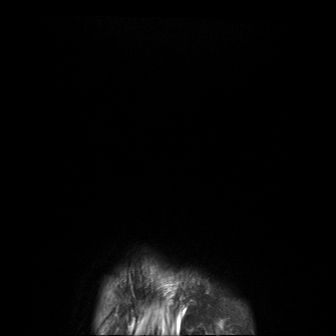
[im 24/48]
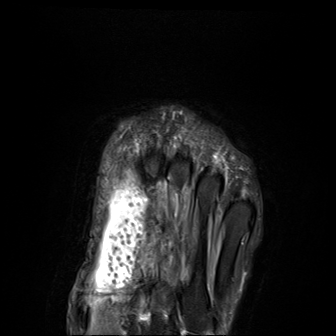
[im 32/48]
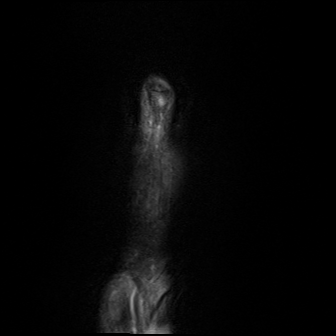
[im 40/48]
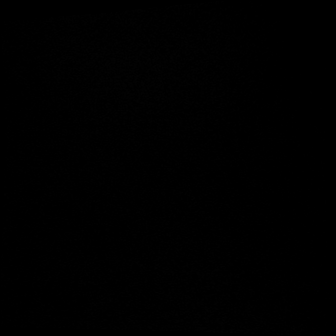
[im 48/48]
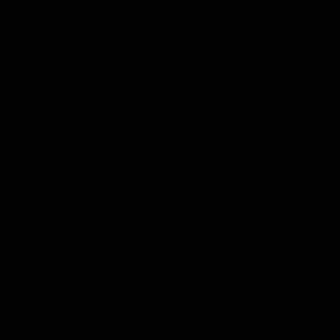

[Series 9: STIR · sagittal · left · 3.0mm · 0.70mm/px · 6 of 42 slices shown]
[im 1/42]
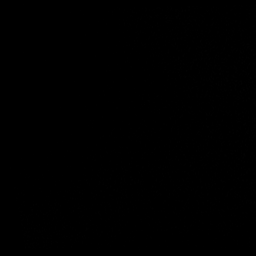
[im 9/42]
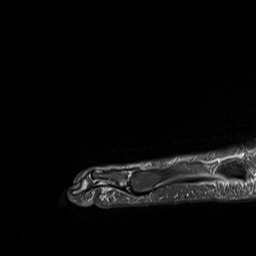
[im 17/42]
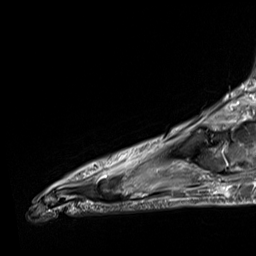
[im 25/42]
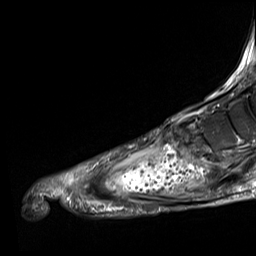
[im 33/42]
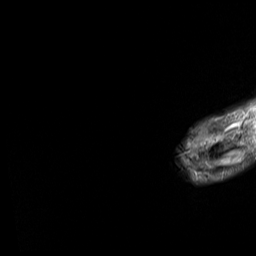
[im 42/42]
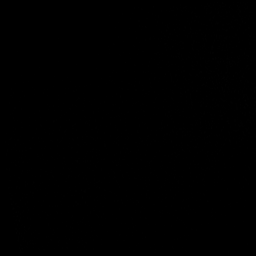

[40 of 40 positions shown; findings below may reference images not displayed]

FINDINGS: Bones/Joint/Cartilage

Status post first ray resection at the level of first
tarsometatarsal joint. Bone marrow edema with confluent low T1
signal changes throughout the proximal phalanx of the left second
toe compatible with acute osteomyelitis (series 6, images 18-20).
There is bone marrow edema throughout the second metatarsal
extending from the metatarsal head to the metatarsal base. Subtle
intermediate T1 signal within the second metatarsal head is
suggestive of early acute osteomyelitis (series 6, image 23). Marrow
edema within the medial cuneiform with erosive changes along the
distal margin and decreased T1 marrow signal are compatible with
acute osteomyelitis (series 6, image 21).

Suspect nondisplaced fracture involving the distal phalanx of the
fifth toe with associated bone marrow edema. Remaining osseous
structures are intact. No dislocation. Similar degree of arthropathy
at the TMT joints compared to prior.

Ligaments

Lisfranc ligament is not well defined secondary to erosive changes
within the medial cuneiform and adjacent soft tissue inflammation.

Muscles and Tendons

Chronic denervation changes of the intrinsic foot musculature.
Amputation changes to the flexor and extensor tendons of the great
toe.

Soft tissues

Fluid collection with small rounded low-density material within the
resection bed of the first metatarsal compatible with antibiotic
beads. The collection measures 6.9 x 2.3 x 2.5 cm and has a small
sinus tract extending to the skin surface at the dorsomedial aspect
of the forefoot (series 5, image 24). No additional fluid
collections.
IMPRESSION: 1. Status post first ray resection at the level of the first
tarsometatarsal joint. Fluid collection with antibiotic beads within
the resection bed measuring 6.9 x 2.3 x 2.5 cm and has a small sinus
tract extending to the skin surface at the dorsomedial aspect of the
forefoot. An infected postoperative collection is not excluded.
2. Acute osteomyelitis of the proximal phalanx of the left second
toe.
3. Early acute osteomyelitis of the second metatarsal, most
convincing at the second metatarsal head.
4. Acute osteomyelitis of the medial cuneiform.
5. Suspect nondisplaced fracture involving the distal phalanx of the
fifth toe with associated bone marrow edema.

These results will be called to the ordering clinician or
representative by the Radiologist Assistant, and communication
documented in the PACS or [REDACTED].

## 2021-03-18 MED ORDER — HYDROMORPHONE HCL 1 MG/ML IJ SOLN
1.0000 mg | INTRAMUSCULAR | Status: DC | PRN
  Administered 2021-03-18 – 2021-03-19 (×5): 1 mg via INTRAVENOUS
  Filled 2021-03-18 (×5): qty 1

## 2021-03-18 MED ORDER — METHOCARBAMOL 1000 MG/10ML IJ SOLN
500.0000 mg | Freq: Four times a day (QID) | INTRAVENOUS | Status: DC | PRN
  Administered 2021-03-19 – 2021-03-24 (×7): 500 mg via INTRAVENOUS
  Filled 2021-03-18: qty 5
  Filled 2021-03-18: qty 500
  Filled 2021-03-18 (×3): qty 5
  Filled 2021-03-18: qty 500
  Filled 2021-03-18: qty 5
  Filled 2021-03-18: qty 500
  Filled 2021-03-18 (×3): qty 5

## 2021-03-18 MED ORDER — OXYCODONE HCL 5 MG PO TABS
10.0000 mg | ORAL_TABLET | ORAL | Status: DC | PRN
  Administered 2021-03-18 – 2021-03-19 (×4): 15 mg via ORAL
  Filled 2021-03-18 (×4): qty 3

## 2021-03-18 MED ORDER — VANCOMYCIN HCL 1500 MG/300ML IV SOLN
1500.0000 mg | Freq: Two times a day (BID) | INTRAVENOUS | Status: AC
  Administered 2021-03-18 – 2021-03-23 (×11): 1500 mg via INTRAVENOUS
  Filled 2021-03-18 (×11): qty 300

## 2021-03-18 NOTE — Progress Notes (Signed)
OT Cancellation Note  Patient Details Name: Brett White MRN: [id number] DOB: [birth year]   Cancelled Treatment:    Reason Eval/Treat Not Completed: Pain limiting ability to participate;Other (comment) (reported not feeling good and decline today but reported they agreeded for following date)  Alphia Moh OTR/L  Acute Rehab Services  [phone] office number [phone] pager number  Alphia Moh 03/18/2021, 12:36 PM

## 2021-03-18 NOTE — Progress Notes (Signed)
PROGRESS NOTE    Brett White   QPR:[id number]  DOB: [birth year]  PCP: Patient, [name]    DOA: 03/16/2021 LOS: 1   Assessment & Plan   Active Problems:   Left foot infection   Pain in left ankle and joints of left foot   Left foot infection s/p 1st ray amputation, revision, debridement 03/03/21 Possible recurrent osteomyelitis -  --continue Vanc, Rocephin, Flagyl --follow blood cultures --MRI confirmed osteo, see report for details findings --Pain controlled --Dr. Lajoyce Corners following   AKI, likely prerenal in the setting of dehydration from nausea vomiting and poor oral intake Baseline Cr 1.0, presented w/Cr 1.41. --Avoid nephrotoxic agents, dehydration, hypotension. --Gentle IV fluid hydration LR at 75 cc/h x 2 days. --Follow BMP   Intractable nausea and vomiting Onset on the day of presentation. Gentle IV fluid hydration IV antiemetics as needed. N.p.o. until seen by orthopedic surgery.   Type 2 diabetes with hyperglycemia - uncontrolled, A1c 11.5 on 02/03/2021 --Lantus 5 unit BID --Insulin sliding scale, resistant scale   Diabetes polyneuropathy - gabapentin continued at lower dose given need to higher opioid doses currently   Chronic anxiety/depression - duloxetine   GERD - on PPI   Opioid-induced constipation --Senokot scheduled, MiraLAX PRN   Ambulatory dysfunction PT and OT  Fall precautions     Patient BMI: Body mass index is 27.6 kg/m.   DVT prophylaxis: SCDs Start: 03/17/21 0423   Diet:  Diet Orders (From admission, onward)     Start     Ordered   03/17/21 0957  Diet Carb Modified Fluid consistency: Thin; Room service appropriate? Yes  Diet effective now       Question Answer Comment  Diet-HS Snack? Nothing   Calorie Level Medium 1600-2000   Fluid consistency: Thin   Room service appropriate? Yes      03/17/21 0956              Code Status: Full Code   Brief Narrative / Hospital Course to Date:   Per H&P by Dr.  Margo Aye: "Brett White is a 60 y.o. male with medical history significant for Left great toe osteomyelitis, left foot abscess status post revision first ray amputation and debridement on 03/03/2021, type 2 diabetes, diabetic polyneuropathy, essential hypertension, chronic anxiety/depression, GERD, who presented to [facility] ED due to worsening severe left foot pain of 5 days duration.  Associated with erythema, edema for the past 2 days.  On the day of presentation he reports intractable nausea and vomiting x4 times nonbloody emesis, could not keep anything down.  He presented to the ED for further evaluation.  Work-up in the ED revealed postoperative amputation of the left first ray at the tarsometatarsal level, no significant change in the appearance since previous study.  Due to concern for recurrent osteomyelitis versus cellulitis and AKI.  He was started on IV antibiotics empirically, IV fluid hydration and pain control.  TRH, hospitalist team, was asked to admit.   ED Course:  Temperature 98.4.  BP 140/84, pulse 83, respiratory 18, O2 saturation 98% on room air.  Lab studies remarkable for serum sodium 137, potassium 4.2, serum glucose 240, BUN 39, creatinine 1.41, anion gap 12, serum bicarb 27, GFR 58.  Lactic acid 2.4."    Subjective 03/18/21    Pt having a great deal of pain. Reports depression due to the pain and near wanting it "cut off".  Denies any other complaints.  Pain causes nausea when up ambulating.   Disposition Plan &  Communication   Status is: Inpatient  Remains inpatient appropriate because:Ongoing active pain requiring inpatient pain management, Ongoing diagnostic testing needed not appropriate for outpatient work up, and possible surgery  Dispo:  Patient From: Home  Planned Disposition: Skilled Nursing Facility  Medically stable for discharge: No       Consults, Procedures, Significant Events   Consultants:  Orthopedic surgery, Dr. Lajoyce Corners  Procedures:   none  Antimicrobials:  Anti-infectives (From admission, onward)    Start     Dose/Rate Route Frequency Ordered Stop   03/18/21 2000  vancomycin (VANCOREADY) IVPB 1500 mg/300 mL        1,500 mg 150 mL/hr over 120 Minutes Intravenous Every 12 hours 03/18/21 1122     03/17/21 2000  vancomycin (VANCOREADY) IVPB 1250 mg/250 mL  Status:  Discontinued        1,250 mg 166.7 mL/hr over 90 Minutes Intravenous Every 12 hours 03/17/21 0528 03/18/21 1122   03/17/21 0430  vancomycin (VANCOREADY) IVPB 2000 mg/400 mL        2,000 mg 200 mL/hr over 120 Minutes Intravenous  Once 03/17/21 0429 03/17/21 0727   03/17/21 0415  cefTRIAXone (ROCEPHIN) 2 g in sodium chloride 0.9 % 100 mL IVPB        2 g 200 mL/hr over 30 Minutes Intravenous Every 24 hours 03/17/21 0407     03/17/21 0415  metroNIDAZOLE (FLAGYL) IVPB 500 mg        500 mg 100 mL/hr over 60 Minutes Intravenous Every 8 hours 03/17/21 0407           Micro    Objective   Vitals:   03/17/21 1201 03/17/21 1932 03/18/21 0454 03/18/21 0819  BP: 115/73 (!) 149/89 (!) 158/99 (!) 162/89  Pulse: 82 80 75 79  Resp: 17 16 16 16   Temp: 99.6 F (37.6 C) 97.7 F (36.5 C) 98.4 F (36.9 C) 97.8 F (36.6 C)  TempSrc: Oral Oral Oral Oral  SpO2: 96% 99% 99% 99%  Weight:      Height:        Intake/Output Summary (Last 24 hours) at 03/18/2021 1850 Last data filed at 03/18/2021 1600 Gross per 24 hour  Intake 400 ml  Output --  Net 400 ml   Filed Weights   03/17/21 0641  Weight: 97.5 kg    Physical Exam:  General exam: awake, alert, no acute distress Respiratory system: normal respiratory effort, on room air. Cardiovascular system: normal S1/S2, RRR, no pedal edema.   Central nervous system: A&O x4. no gross focal neurologic deficits, normal speech Extremities: Right foot dressing in place, no visible drainage or bleeding, no edema, normal tone Psychiatry: depressed mood, congruent affect, judgement and insight appear normal  Labs    Data Reviewed: I have personally reviewed following labs and imaging studies  CBC: Recent Labs  Lab 03/16/21 2358 03/17/21 0644 03/18/21 0659  WBC 7.8 7.0 4.8  NEUTROABS 5.8  --   --   HGB 13.4 12.4* 11.7*  HCT 41.5 38.3* 36.1*  MCV 88.3 86.7 88.3  PLT 349 295 265   Basic Metabolic Panel: Recent Labs  Lab 03/16/21 2358 03/17/21 0644 03/18/21 0659  NA 137 139 136  K 4.2 3.8 3.9  CL 98 100 103  CO2 27 27 26   GLUCOSE 240* 189* 137*  BUN 39* 40* 31*  CREATININE 1.41* 1.21 0.90  CALCIUM 9.6 9.3 8.9  MG  --  1.8  --   PHOS  --  3.5  --  GFR: Estimated Creatinine Clearance: 105.3 mL/min (by C-G formula based on SCr of 0.9 mg/dL). Liver Function Tests: Recent Labs  Lab 03/16/21 2358  AST 23  ALT 29  ALKPHOS 75  BILITOT 0.4  PROT 8.3*  ALBUMIN 3.8   No results for input(s): LIPASE, AMYLASE in the last 168 hours. No results for input(s): AMMONIA in the last 168 hours. Coagulation Profile: No results for input(s): INR, PROTIME in the last 168 hours. Cardiac Enzymes: No results for input(s): CKTOTAL, CKMB, CKMBINDEX, TROPONINI in the last 168 hours. BNP (last 3 results) No results for input(s): PROBNP in the last 8760 hours. HbA1C: No results for input(s): HGBA1C in the last 72 hours. CBG: Recent Labs  Lab 03/18/21 0001 03/18/21 0453 03/18/21 0907 03/18/21 1145 03/18/21 1604  GLUCAP 174* 142* 228* 237* 141*   Lipid Profile: No results for input(s): CHOL, HDL, LDLCALC, TRIG, CHOLHDL, LDLDIRECT in the last 72 hours. Thyroid Function Tests: No results for input(s): TSH, T4TOTAL, FREET4, T3FREE, THYROIDAB in the last 72 hours. Anemia Panel: No results for input(s): VITAMINB12, FOLATE, FERRITIN, TIBC, IRON, RETICCTPCT in the last 72 hours. Sepsis Labs: Recent Labs  Lab 03/17/21 0000  LATICACIDVEN 2.4*    Recent Results (from the past 240 hour(s))  SARS CORONAVIRUS 2 (TAT 6-24 HRS) Nasopharyngeal Nasopharyngeal Swab     Status: None   Collection Time:  03/17/21  4:55 AM   Specimen: Nasopharyngeal Swab  Result Value Ref Range Status   SARS Coronavirus 2 NEGATIVE NEGATIVE Final    Comment: (NOTE) SARS-CoV-2 target nucleic acids are NOT DETECTED.  The SARS-CoV-2 RNA is generally detectable in upper and lower respiratory specimens during the acute phase of infection. Negative results do not preclude SARS-CoV-2 infection, do not rule out co-infections with other pathogens, and should not be used as the sole basis for treatment or other patient management decisions. Negative results must be combined with clinical observations, patient history, and epidemiological information. The expected result is Negative.  Fact Sheet for Patients: HairSlick.nohttps://www.fda.gov/media/138098/download  Fact Sheet for Healthcare Providers: quierodirigir.comhttps://www.fda.gov/media/138095/download  This test is not yet approved or cleared by the [place] FDA and  has been authorized for detection and/or diagnosis of SARS-CoV-2 by FDA under an Emergency Use Authorization (EUA). This EUA will remain  in effect (meaning this test can be used) for the duration of the COVID-19 declaration under Se ction 564(b)(1) of the Act, 21 U.S.C. section 360bbb-3(b)(1), unless the authorization is terminated or revoked sooner.  Performed at [facility], [address] [place], [place] [address]       Imaging Studies   MR FOOT LEFT WO CONTRAST  Result Date: 03/18/2021 CLINICAL DATA:  Diabetic foot infection. History of first ray amputation and debridement on 03/03/2021 EXAM: MRI OF THE LEFT FOOT WITHOUT CONTRAST TECHNIQUE: Multiplanar, multisequence MR imaging of the left forefoot was performed. No intravenous contrast was administered. COMPARISON:  X-ray 03/17/2021 FINDINGS: Bones/Joint/Cartilage Status post first ray resection at the level of first tarsometatarsal joint. Bone marrow edema with confluent low T1 signal changes throughout the proximal phalanx of the left second  toe compatible with acute osteomyelitis (series 6, images 18-20). There is bone marrow edema throughout the second metatarsal extending from the metatarsal head to the metatarsal base. Subtle intermediate T1 signal within the second metatarsal head is suggestive of early acute osteomyelitis (series 6, image 23). Marrow edema within the medial cuneiform with erosive changes along the distal margin and decreased T1 marrow signal are compatible with acute osteomyelitis (  series 6, image 21). Suspect nondisplaced fracture involving the distal phalanx of the fifth toe with associated bone marrow edema. Remaining osseous structures are intact. No dislocation. Similar degree of arthropathy at the TMT joints compared to prior. Ligaments Lisfranc ligament is not well defined secondary to erosive changes within the medial cuneiform and adjacent soft tissue inflammation. Muscles and Tendons Chronic denervation changes of the intrinsic foot musculature. Amputation changes to the flexor and extensor tendons of the great toe. Soft tissues Fluid collection with small rounded low-density material within the resection bed of the first metatarsal compatible with antibiotic beads. The collection measures 6.9 x 2.3 x 2.5 cm and has a small sinus tract extending to the skin surface at the dorsomedial aspect of the forefoot (series 5, image 24). No additional fluid collections. IMPRESSION: 1. Status post first ray resection at the level of the first tarsometatarsal joint. Fluid collection with antibiotic beads within the resection bed measuring 6.9 x 2.3 x 2.5 cm and has a small sinus tract extending to the skin surface at the dorsomedial aspect of the forefoot. An infected postoperative collection is not excluded. 2. Acute osteomyelitis of the proximal phalanx of the left second toe. 3. Early acute osteomyelitis of the second metatarsal, most convincing at the second metatarsal head. 4. Acute osteomyelitis of the medial cuneiform. 5.  Suspect nondisplaced fracture involving the distal phalanx of the fifth toe with associated bone marrow edema. These results will be called to the ordering clinician or representative by the Radiologist Assistant, and communication documented in the PACS or [employer]. Electronically Signed   By: Duanne Guess D.O.   On: 03/18/2021 11:16   DG Foot Complete Left  Result Date: 03/17/2021 CLINICAL DATA:  Recent amputation with worsening infection and bleeding. EXAM: LEFT FOOT - COMPLETE 3+ VIEW COMPARISON:  03/02/2021 FINDINGS: Postoperative changes with amputation of the left first ray at the tarsometatarsal level. Foreign material in the postoperative soft tissues consistent with packing material. No radiopaque soft tissue gas collections. Bones appear unchanged since prior study. Degenerative changes in the intertarsal joints. IMPRESSION: Postoperative amputation of the left first ray at the tarsometatarsal level. No significant changes in appearance since previous study. Electronically Signed   By: Burman Nieves M.D.   On: 03/17/2021 00:45     Medications   Scheduled Meds:  DULoxetine  30 mg Oral Daily   gabapentin  300 mg Oral BID   insulin aspart  0-20 Units Subcutaneous Q4H   insulin glargine  5 Units Subcutaneous BID   pantoprazole  40 mg Oral Daily   senna-docusate  2 tablet Oral QHS   zinc sulfate  220 mg Oral Daily   Continuous Infusions:  cefTRIAXone (ROCEPHIN)  IV 2 g (03/18/21 0800)   methocarbamol (ROBAXIN) IV     metronidazole 500 mg (03/18/21 1428)   vancomycin         LOS: 1 day    Time spent: 25 minutes with > 50% spent at bedside and coordination of care    Pennie Banter, DO [facility]  03/18/2021, 6:50 PM      If 7PM-7AM, please contact night-coverage. How to contact the [facility] Attending or Consulting provider 7A - 7P or covering provider during after hours 7P -7A, for this patient?    Check the care team in [facility] and look for a)  attending/consulting [facility] provider listed and b) the [facility] team listed Log into [email or web address] and use [place]'s universal password to access. If you do not  have the password, please contact the hospital operator. Locate the [facility] provider you are looking for under [facility] and page to a number that you can be directly reached. If you still have difficulty reaching the provider, please page the [facility] (Director on Call) for the Hospitalists listed on amion for assistance.

## 2021-03-18 NOTE — Evaluation (Signed)
Physical Therapy Evaluation Patient Details Name: Brett White MRN: [id number] DOB: August 18, 1961 Today's Date: 03/18/2021   History of Present Illness  Pt is 60 yo male who had L 1st ray amputation 02/04/21 and subsequent I&D 03/03/21 and presents with persistent pain in that foot with 2 episodes of N&V. PMH: DM2, back sx, GERD, neuropathy, anxiety/ depression.  Clinical Impression  Pt admitted with above diagnosis. Pt received in bed rating L foot pain as 10/10 and stating he would rather have a below knee amputation than have this pain any longer. Did some post op BKA education with him in case he does end up going this route and since he was receptive at the time. Pt independent with bed mobility. Vc's needed to safely stand to RW. Pt ambulated 20' with RW and min-guard A keeping LLE NWB with cues to remain close to RW so be able to fully take wt through UE's. Pt went to recliner after ambulation with BLE's elevated.  Pt currently with functional limitations due to the deficits listed below (see PT Problem List). Pt will benefit from skilled PT to increase their independence and safety with mobility to allow discharge to the venue listed below.       Follow Up Recommendations No PT follow up (will change if pt has another sx)    Equipment Recommendations  None recommended by PT (w/c if pt has amp)    Recommendations for Other Services       Precautions / Restrictions Precautions Precautions: Fall Required Braces or Orthoses: Other Brace Other Brace: has post op shoe but could not find in room Restrictions Weight Bearing Restrictions: No LLE Weight Bearing: Weight bearing as tolerated      Mobility  Bed Mobility Overal bed mobility: Modified Independent                  Transfers Overall transfer level: Needs assistance Equipment used: Rolling walker (2 wheeled) Transfers: Sit to/from Stand Sit to Stand: Supervision         General transfer comment: vc's for hand  placement  Ambulation/Gait Ambulation/Gait assistance: Min guard Gait Distance (Feet): 20 Feet Assistive device: Rolling walker (2 wheeled)   Gait velocity: decreased Gait velocity interpretation: <1.8 ft/sec, indicate of risk for recurrent falls General Gait Details: pt assuming that he will have a BKA so kept pt NWB LLE to practice what it will be like. Practiced fwd and bkwd hopping. Pt with sufficient strength to mobilize with RW on RLE only.  Stairs         General stair comments: discussed stairs bkwd with RW, did not practice today. Ramp may be built before pt goes Occupational psychologist [place] (Stroke Patients Only)       Balance Overall balance assessment: Needs assistance Sitting-balance support: Feet supported Sitting balance-Leahy Scale: Good     Standing balance support: Bilateral upper extremity supported;During functional activity Standing balance-Leahy Scale: Fair                               Pertinent Vitals/Pain Pain Assessment: No/denies pain Pain Score: 10-Worst pain ever Pain Location: L foot Pain Descriptors / Indicators: Discomfort Pain Intervention(s): Limited activity within patient's tolerance;Monitored during session    Home Living Family/patient expects to be discharged to:: Private residence Living Arrangements: Other relatives Available Help at Discharge: Friend(s);Available 24 hours/day Type of Home: House Home Access: Stairs to enter (ramp being  built) Entrance Stairs-Rails: Right Secretary/administrator of Steps: 4 Home Layout: One level Home Equipment: Walker - 2 wheels;Bedside commode Additional Comments: recently moved in with best friend's sister    Prior Function Level of Independence: Independent with assistive device(s)         Comments: walk with RW since amp     Hand Dominance   Dominant Hand: Right    Extremity/Trunk Assessment   Upper Extremity Assessment Upper Extremity  Assessment: Overall WFL for tasks assessed    Lower Extremity Assessment Lower Extremity Assessment: LLE deficits/detail RLE Deficits / Details: grossly 4/5 RLE Sensation: history of peripheral neuropathy RLE Coordination: decreased gross motor LLE Deficits / Details: L foot very painful, tolerates little to no wt on it, strength at L hip and knee grossly 4/5 LLE: Unable to fully assess due to pain LLE Sensation: history of peripheral neuropathy LLE Coordination: decreased gross motor    Cervical / Trunk Assessment Cervical / Trunk Assessment: Normal  Communication   Communication: No difficulties  Cognition Arousal/Alertness: Awake/alert Behavior During Therapy: WFL for tasks assessed/performed Overall Cognitive Status: Within Functional Limits for tasks assessed                                        General Comments General comments (skin integrity, edema, etc.): VSS. Did some post amp education with pt in case he does go that route since today he was doing well cognitively and could attend to conversation. Pt reports he would rather have BKA than continue with pain he has in LLE    Exercises General Exercises - Lower Extremity Ankle Circles/Pumps: AROM;Both;10 reps;Seated Straight Leg Raises: AROM;Left;10 reps;Seated   Assessment/Plan    PT Assessment Patient needs continued PT services  PT Problem List Decreased strength;Decreased range of motion;Decreased activity tolerance;Decreased balance;Decreased mobility;Decreased coordination;Decreased knowledge of use of DME;Decreased safety awareness;Decreased skin integrity       PT Treatment Interventions DME instruction;Gait training;Functional mobility training;Therapeutic activities;Therapeutic exercise;Balance training;Neuromuscular re-education;Patient/family education    PT Goals (Current goals can be found in the Care Plan section)  Acute Rehab PT Goals Patient Stated Goal: go to friend's home PT  Goal Formulation: With patient Time For Goal Achievement: 04/01/21 Potential to Achieve Goals: Good    Frequency Min 3X/week   Barriers to discharge        Co-evaluation               AM-PAC PT "6 Clicks" Mobility  Outcome Measure Help needed turning from your back to your side while in a flat bed without using bedrails?: None Help needed moving from lying on your back to sitting on the side of a flat bed without using bedrails?: None Help needed moving to and from a bed to a chair (including a wheelchair)?: None Help needed standing up from a chair using your arms (e.g., wheelchair or bedside chair)?: A Little Help needed to walk in hospital room?: A Little Help needed climbing 3-5 steps with a railing? : A Little 6 Click Score: 21    End of Session Equipment Utilized During Treatment: Gait belt Activity Tolerance: Patient tolerated treatment well Patient left: in chair;with call bell/phone within reach Nurse Communication: Mobility status PT Visit Diagnosis: Unsteadiness on feet (R26.81);Difficulty in walking, not elsewhere classified (R26.2);Pain;Other abnormalities of gait and mobility (R26.89) Pain - Right/Left: Left Pain - part of body: Ankle and joints of foot  Time: 3646-8032 PT Time Calculation (min) (ACUTE ONLY): 29 min   Charges:   PT Evaluation $PT Eval Moderate Complexity: 1 Mod PT Treatments $Gait Training: 8-22 mins        Lyanne Co, PT  Acute Rehab Services  Pager [phone] Office [phone]   Lawana Chambers Zamir Staples 03/18/2021, 9:51 AM

## 2021-03-19 DIAGNOSIS — M86172 Other acute osteomyelitis, left ankle and foot: Secondary | ICD-10-CM | POA: Diagnosis present

## 2021-03-19 LAB — CBC
HCT: 36.4 % — ABNORMAL LOW (ref 39.0–52.0)
Hemoglobin: 12 g/dL — ABNORMAL LOW (ref 13.0–17.0)
MCH: 28.9 pg (ref 26.0–34.0)
MCHC: 33 g/dL (ref 30.0–36.0)
MCV: 87.7 fL (ref 80.0–100.0)
Platelets: 277 10*3/uL (ref 150–400)
RBC: 4.15 MIL/uL — ABNORMAL LOW (ref 4.22–5.81)
RDW: 13 % (ref 11.5–15.5)
WBC: 5.7 10*3/uL (ref 4.0–10.5)
nRBC: 0 % (ref 0.0–0.2)

## 2021-03-19 LAB — BASIC METABOLIC PANEL
Anion gap: 6 (ref 5–15)
BUN: 30 mg/dL — ABNORMAL HIGH (ref 6–20)
CO2: 27 mmol/L (ref 22–32)
Calcium: 8.8 mg/dL — ABNORMAL LOW (ref 8.9–10.3)
Chloride: 105 mmol/L (ref 98–111)
Creatinine, Ser: 0.89 mg/dL (ref 0.61–1.24)
GFR, Estimated: >60 mL/min (ref >60)
Glucose, Bld: 173 mg/dL — ABNORMAL HIGH (ref 70–99)
Potassium: 5.2 mmol/L — ABNORMAL HIGH (ref 3.5–5.1)
Sodium: 138 mmol/L (ref 135–145)

## 2021-03-19 LAB — GLUCOSE, CAPILLARY
Glucose-Capillary: 118 mg/dL — ABNORMAL HIGH (ref 70–99)
Glucose-Capillary: 124 mg/dL — ABNORMAL HIGH (ref 70–99)
Glucose-Capillary: 130 mg/dL — ABNORMAL HIGH (ref 70–99)
Glucose-Capillary: 155 mg/dL — ABNORMAL HIGH (ref 70–99)
Glucose-Capillary: 196 mg/dL — ABNORMAL HIGH (ref 70–99)
Glucose-Capillary: 274 mg/dL — ABNORMAL HIGH (ref 70–99)

## 2021-03-19 MED ORDER — HYDROMORPHONE HCL 1 MG/ML IJ SOLN
1.0000 mg | INTRAMUSCULAR | Status: DC | PRN
  Administered 2021-03-19 – 2021-03-29 (×52): 1 mg via INTRAVENOUS
  Filled 2021-03-19 (×52): qty 1

## 2021-03-19 MED ORDER — OXYCODONE HCL 5 MG PO TABS
10.0000 mg | ORAL_TABLET | Freq: Four times a day (QID) | ORAL | Status: DC | PRN
  Administered 2021-03-21 (×2): 15 mg via ORAL
  Filled 2021-03-19 (×2): qty 3

## 2021-03-19 MED ORDER — OXYCODONE HCL 5 MG PO TABS
15.0000 mg | ORAL_TABLET | Freq: Four times a day (QID) | ORAL | Status: DC
  Administered 2021-03-19 – 2021-03-21 (×7): 15 mg via ORAL
  Filled 2021-03-19 (×7): qty 3

## 2021-03-19 NOTE — Evaluation (Signed)
Occupational Therapy Evaluation Patient Details Name: Brett White MRN: [id number] DOB: 06/20/1961 Today's Date: 03/19/2021    History of Present Illness Pt is 60 yo male who had L 1st ray amputation 02/04/21 and subsequent I&D 03/03/21 and presents with persistent pain in that foot with 2 episodes of N&V. PMH: DM2, back sx, GERD, neuropathy, anxiety/ depression.   Clinical Impression   Pt admitted to ED for concerns listed above, with potential plans to complete a L BKA. PTA pt reported that he has returned home after his initial amputation at an overall supervision level with DME. At the time of the evaluation, pt presented with increased debilitating pain. Pt required min guard to min A for all ADL's and functional mobility. Additionally, pt received education on BKA's and what to expect mobility wise. Pt accepting of education and Acute OT will follow to address concerns listed below.    Follow Up Recommendations  SNF    Equipment Recommendations  None recommended by OT    Recommendations for Other Services       Precautions / Restrictions Precautions Precautions: Fall Other Brace: has post op shoe but could not find in room Restrictions Weight Bearing Restrictions: No LLE Weight Bearing: Weight bearing as tolerated      Mobility Bed Mobility Overal bed mobility: Modified Independent                  Transfers Overall transfer level: Needs assistance Equipment used: Rolling walker (2 wheeled) Transfers: Sit to/from Stand Sit to Stand: Min assist              Balance Overall balance assessment: Needs assistance Sitting-balance support: Feet supported Sitting balance-Leahy Scale: Good     Standing balance support: Bilateral upper extremity supported;During functional activity Standing balance-Leahy Scale: Fair                             ADL either performed or assessed with clinical judgement   ADL Overall ADL's : Needs  assistance/impaired Eating/Feeding: Set up;Sitting   Grooming: Wash/dry hands;Wash/dry face;Set up   Upper Body Bathing: Set up;Sitting   Lower Body Bathing: Min guard;Sitting/lateral leans   Upper Body Dressing : Set up;Sitting   Lower Body Dressing: Set up;Sitting/lateral leans Lower Body Dressing Details (indicate cue type and reason): pt donned R sock; unable to locate post-op shoe in room at this time Toilet Transfer: Minimal assistance;Ambulation Toilet Transfer Details (indicate cue type and reason): Short distances Toileting- Architect and Hygiene: Min guard;Sitting/lateral lean;Sit to/from stand   Tub/ Shower Transfer: Minimal assistance;Ambulation   Functional mobility during ADLs: Min guard;Rolling walker General ADL Comments: Overall at min guard level for safety, min a for transfers due to assist with standing     Vision Baseline Vision/History: No visual deficits Patient Visual Report: No change from baseline Vision Assessment?: No apparent visual deficits     Perception Perception Perception Tested?: No   Praxis Praxis Praxis tested?: Not tested    Pertinent Vitals/Pain Pain Assessment: 0-10 Pain Score: 10-Worst pain ever Pain Location: L foot Pain Descriptors / Indicators: Discomfort Pain Intervention(s): Limited activity within patient's tolerance;Monitored during session;Repositioned     Hand Dominance Right   Extremity/Trunk Assessment Upper Extremity Assessment Upper Extremity Assessment: Overall WFL for tasks assessed   Lower Extremity Assessment Lower Extremity Assessment: Defer to PT evaluation   Cervical / Trunk Assessment Cervical / Trunk Assessment: Normal   Communication Communication Communication: No difficulties  Cognition Arousal/Alertness: Awake/alert Behavior During Therapy: WFL for tasks assessed/performed Overall Cognitive Status: Within Functional Limits for tasks assessed                                      General Comments  VSS on RA    Exercises     Shoulder Instructions      Home Living Family/patient expects to be discharged to:: Private residence Living Arrangements: Other relatives Available Help at Discharge: Friend(s);Available 24 hours/day Type of Home: House Home Access: Stairs to enter [employer] of Steps: 4 Entrance Stairs-Rails: Right Home Layout: One level     Bathroom Shower/Tub: Chief Strategy Officer: Standard Bathroom Accessibility: Yes   Home Equipment: Environmental consultant - 2 wheels;Bedside commode   Additional Comments: recently moved in with best friend's sister      Prior Functioning/Environment Level of Independence: Independent with assistive device(s)        Comments: walk with RW since amp        OT Problem List: Decreased strength;Decreased activity tolerance;Decreased safety awareness;Decreased knowledge of use of DME or AE;Pain      OT Treatment/Interventions: Self-care/ADL training;Therapeutic exercise;Energy conservation;DME and/or AE instruction;Therapeutic activities;Patient/family education;Balance training    OT Goals(Current goals can be found in the care plan section) Acute Rehab OT Goals Patient Stated Goal: To have less pain OT Goal Formulation: With patient Time For Goal Achievement: 04/02/21 Potential to Achieve Goals: Good ADL Goals Pt Will Perform Grooming: with supervision;standing Pt Will Perform Lower Body Bathing: with supervision;sitting/lateral leans;sit to/from stand Pt Will Transfer to Toilet: with supervision;stand pivot transfer Pt Will Perform Toileting - Clothing Manipulation and hygiene: with supervision;sitting/lateral leans;sit to/from stand  OT Frequency: Min 2X/week   Barriers to D/C:            Co-evaluation              AM-PAC OT "6 Clicks" Daily Activity     Outcome Measure Help from another person eating meals?: A Little Help from another person taking care of  personal grooming?: A Little Help from another person toileting, which includes using toliet, bedpan, or urinal?: A Little Help from another person bathing (including washing, rinsing, drying)?: A Little Help from another person to put on and taking off regular upper body clothing?: A Little Help from another person to put on and taking off regular lower body clothing?: A Little 6 Click Score: 18   End of Session Equipment Utilized During Treatment: Gait belt;Rolling walker Nurse Communication: Mobility status  Activity Tolerance: Patient tolerated treatment well Patient left: in bed;with call bell/phone within reach  OT Visit Diagnosis: Repeated falls (R29.6);Muscle weakness (generalized) (M62.81);Other abnormalities of gait and mobility (R26.89) Pain - Right/Left: Left                Time: 8101-7510 OT Time Calculation (min): 15 min Charges:  OT General Charges $OT Visit: 1 Visit OT Evaluation $OT Eval Moderate Complexity: 1 Mod  Jena Tegeler H., OTR/L Acute Rehabilitation  Braeson Rupe Elane Bing Plume 03/19/2021, 5:04 PM

## 2021-03-19 NOTE — Progress Notes (Signed)
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  Monitor   --Dr. Lajoyce Corners following   AKI, likely prerenal in the setting of dehydration from nausea vomiting and poor oral intake Baseline Cr 1.0, presented w/Cr 1.41. --Avoid nephrotoxic agents, dehydration, hypotension. --Gentle IV fluid hydration LR at 75 cc/h x 2 days. --Follow BMP   Intractable nausea and vomiting Onset on the day of presentation. Gentle IV fluid hydration IV antiemetics as needed. N.p.o. until seen by orthopedic surgery.   Type 2 diabetes with hyperglycemia - uncontrolled, A1c 11.5 on 02/03/2021 --Lantus 5 unit BID --Insulin sliding scale, resistant scale   Diabetes polyneuropathy - gabapentin continued at lower dose given need to higher opioid doses currently   Chronic anxiety/depression - duloxetine   GERD - on PPI   Opioid-induced constipation --Senokot scheduled, MiraLAX PRN   Ambulatory dysfunction PT and OT  Fall precautions     Patient BMI: Body mass index is 27.6 kg/m.   DVT prophylaxis: SCDs Start: 03/17/21 0423   Diet:  Diet Orders (From admission, onward)     Start     Ordered   03/17/21 0957  Diet Carb Modified Fluid consistency: Thin; Room service appropriate? Yes  Diet effective now       Question Answer Comment  Diet-HS Snack? Nothing   Calorie Level Medium 1600-2000   Fluid consistency: Thin   Room service  appropriate? Yes      03/17/21 0956              Code Status: Full Code   Brief Narrative / Hospital Course to Date:   Per H&P by Dr. Margo Aye: "Brett White is a 60 y.o. male with medical history significant for Left great toe osteomyelitis, left foot abscess status post revision first ray amputation and debridement on 03/03/2021, type 2 diabetes, diabetic polyneuropathy, essential hypertension, chronic anxiety/depression, GERD, who presented to [facility] ED due to worsening severe left foot pain of 5 days duration.  Associated with erythema, edema for the past 2 days.  On the day of presentation he reports intractable nausea and vomiting x4 times nonbloody emesis, could not keep anything down.  He presented to the ED for further evaluation.  Work-up in the ED revealed postoperative amputation of the left first ray at the tarsometatarsal level, no significant change in the appearance since previous study.  Due to concern for recurrent osteomyelitis versus cellulitis and AKI.  He was started on IV antibiotics empirically, IV fluid hydration and pain control.  TRH, hospitalist team, was asked to admit.   ED Course:  Temperature 98.4.  BP 140/84, pulse 83, respiratory 18, O2 saturation 98% on room air.  Lab studies remarkable for serum sodium 137, potassium 4.2, serum glucose 240, BUN 39, creatinine 1.41, anion gap 12, serum bicarb 27, GFR 58.  Lactic acid 2.4."    Subjective 03/19/21    Pt continues with constant severe pain  in his foot.  We discussed MRI results and areas of osteomyelitis and 5th toe fracture.  No sleep due to pain.  No other acute issues.  Can't wait to have surgery and hopefully not be in pain any more.   Disposition Plan & Communication   Status is: Inpatient  Remains inpatient appropriate because:Ongoing active pain requiring inpatient pain management, Ongoing diagnostic testing needed not appropriate for outpatient work up, and possible surgery  Dispo:  Patient From:  Home  Planned Disposition: Skilled Nursing Facility  Medically stable for discharge: No       Consults, Procedures, Significant Events   Consultants:  Orthopedic surgery, Dr. Lajoyce Corners  Procedures:  none  Antimicrobials:  Anti-infectives (From admission, onward)    Start     Dose/Rate Route Frequency Ordered Stop   03/18/21 2000  vancomycin (VANCOREADY) IVPB 1500 mg/300 mL        1,500 mg 150 mL/hr over 120 Minutes Intravenous Every 12 hours 03/18/21 1122     03/17/21 2000  vancomycin (VANCOREADY) IVPB 1250 mg/250 mL  Status:  Discontinued        1,250 mg 166.7 mL/hr over 90 Minutes Intravenous Every 12 hours 03/17/21 0528 03/18/21 1122   03/17/21 0430  vancomycin (VANCOREADY) IVPB 2000 mg/400 mL        2,000 mg 200 mL/hr over 120 Minutes Intravenous  Once 03/17/21 0429 03/17/21 0727   03/17/21 0415  cefTRIAXone (ROCEPHIN) 2 g in sodium chloride 0.9 % 100 mL IVPB        2 g 200 mL/hr over 30 Minutes Intravenous Every 24 hours 03/17/21 0407     03/17/21 0415  metroNIDAZOLE (FLAGYL) IVPB 500 mg        500 mg 100 mL/hr over 60 Minutes Intravenous Every 8 hours 03/17/21 0407           Micro    Objective   Vitals:   03/18/21 0819 03/18/21 2039 03/19/21 0700 03/19/21 1124  BP: (!) 162/89 (!) 144/96 (!) 152/92 (!) 144/82  Pulse: 79 78 78 78  Resp: 16 17 18 19   Temp: 97.8 F (36.6 C) 97.9 F (36.6 C) 98.7 F (37.1 C) 99 F (37.2 C)  TempSrc: Oral Oral Oral Oral  SpO2: 99% 98%    Weight:      Height:        Intake/Output Summary (Last 24 hours) at 03/19/2021 1430 Last data filed at 03/19/2021 1100 Gross per 24 hour  Intake 400 ml  Output 620 ml  Net -220 ml   Filed Weights   03/17/21 0641  Weight: 97.5 kg    Physical Exam:  General exam: awake, alert, no acute distress Respiratory system: normal respiratory effort, on room air. Cardiovascular system: RRR, no pedal edema.   Central nervous system: A&O x4. no gross focal neurologic deficits, normal  speech Extremities: Right foot with clean/dry/intact dressing in place, no edema, normal tone Psychiatry: depressed mood, congruent affect, judgement and insight appear normal  Labs   Data Reviewed: I have personally reviewed following labs and imaging studies  CBC: Recent Labs  Lab 03/16/21 2358 03/17/21 0644 03/18/21 0659 03/19/21 0115  WBC 7.8 7.0 4.8 5.7  NEUTROABS 5.8  --   --   --   HGB 13.4 12.4* 11.7* 12.0*  HCT 41.5 38.3* 36.1* 36.4*  MCV 88.3 86.7 88.3 87.7  PLT 349 295 265 277   Basic Metabolic Panel: Recent Labs  Lab 03/16/21 2358 03/17/21 0644 03/18/21 0659 03/19/21 0115  NA 137  139 136 138  K 4.2 3.8 3.9 5.2*  CL 98 100 103 105  CO2 27 27 26 27   GLUCOSE 240* 189* 137* 173*  BUN 39* 40* 31* 30*  CREATININE 1.41* 1.21 0.90 0.89  CALCIUM 9.6 9.3 8.9 8.8*  MG  --  1.8  --   --   PHOS  --  3.5  --   --    GFR: Estimated Creatinine Clearance: 106.5 mL/min (by C-G formula based on SCr of 0.89 mg/dL). Liver Function Tests: Recent Labs  Lab 03/16/21 2358  AST 23  ALT 29  ALKPHOS 75  BILITOT 0.4  PROT 8.3*  ALBUMIN 3.8   No results for input(s): LIPASE, AMYLASE in the last 168 hours. No results for input(s): AMMONIA in the last 168 hours. Coagulation Profile: No results for input(s): INR, PROTIME in the last 168 hours. Cardiac Enzymes: No results for input(s): CKTOTAL, CKMB, CKMBINDEX, TROPONINI in the last 168 hours. BNP (last 3 results) No results for input(s): PROBNP in the last 8760 hours. HbA1C: No results for input(s): HGBA1C in the last 72 hours. CBG: Recent Labs  Lab 03/18/21 2041 03/19/21 0003 03/19/21 0424 03/19/21 0812 03/19/21 1126  GLUCAP 177* 124* 155* 130* 196*   Lipid Profile: No results for input(s): CHOL, HDL, LDLCALC, TRIG, CHOLHDL, LDLDIRECT in the last 72 hours. Thyroid Function Tests: No results for input(s): TSH, T4TOTAL, FREET4, T3FREE, THYROIDAB in the last 72 hours. Anemia Panel: No results for input(s):  VITAMINB12, FOLATE, FERRITIN, TIBC, IRON, RETICCTPCT in the last 72 hours. Sepsis Labs: Recent Labs  Lab 03/17/21 0000  LATICACIDVEN 2.4*    Recent Results (from the past 240 hour(s))  SARS CORONAVIRUS 2 (TAT 6-24 HRS) Nasopharyngeal Nasopharyngeal Swab     Status: None   Collection Time: 03/17/21  4:55 AM   Specimen: Nasopharyngeal Swab  Result Value Ref Range Status   SARS Coronavirus 2 NEGATIVE NEGATIVE Final    Comment: (NOTE) SARS-CoV-2 target nucleic acids are NOT DETECTED.  The SARS-CoV-2 RNA is generally detectable in upper and lower respiratory specimens during the acute phase of infection. Negative results do not preclude SARS-CoV-2 infection, do not rule out co-infections with other pathogens, and should not be used as the sole basis for treatment or other patient management decisions. Negative results must be combined with clinical observations, patient history, and epidemiological information. The expected result is Negative.  Fact Sheet for Patients: HairSlick.nohttps://www.fda.gov/media/138098/download  Fact Sheet for Healthcare Providers: quierodirigir.comhttps://www.fda.gov/media/138095/download  This test is not yet approved or cleared by the [place] FDA and  has been authorized for detection and/or diagnosis of SARS-CoV-2 by FDA under an Emergency Use Authorization (EUA). This EUA will remain  in effect (meaning this test can be used) for the duration of the COVID-19 declaration under Se ction 564(b)(1) of the Act, 21 U.S.C. section 360bbb-3(b)(1), unless the authorization is terminated or revoked sooner.  Performed at [facility], [address] [place], [place] [address]       Imaging Studies   MR FOOT LEFT WO CONTRAST  Result Date: 03/18/2021 CLINICAL DATA:  Diabetic foot infection. History of first ray amputation and debridement on 03/03/2021 EXAM: MRI OF THE LEFT FOOT WITHOUT CONTRAST TECHNIQUE: Multiplanar, multisequence MR imaging of the left forefoot was  performed. No intravenous contrast was administered. COMPARISON:  X-ray 03/17/2021 FINDINGS: Bones/Joint/Cartilage Status post first ray resection at the level of first tarsometatarsal joint. Bone marrow edema with confluent low T1 signal changes throughout the proximal phalanx of the  left second toe compatible with acute osteomyelitis (series 6, images 18-20). There is bone marrow edema throughout the second metatarsal extending from the metatarsal head to the metatarsal base. Subtle intermediate T1 signal within the second metatarsal head is suggestive of early acute osteomyelitis (series 6, image 23). Marrow edema within the medial cuneiform with erosive changes along the distal margin and decreased T1 marrow signal are compatible with acute osteomyelitis (series 6, image 21). Suspect nondisplaced fracture involving the distal phalanx of the fifth toe with associated bone marrow edema. Remaining osseous structures are intact. No dislocation. Similar degree of arthropathy at the TMT joints compared to prior. Ligaments Lisfranc ligament is not well defined secondary to erosive changes within the medial cuneiform and adjacent soft tissue inflammation. Muscles and Tendons Chronic denervation changes of the intrinsic foot musculature. Amputation changes to the flexor and extensor tendons of the great toe. Soft tissues Fluid collection with small rounded low-density material within the resection bed of the first metatarsal compatible with antibiotic beads. The collection measures 6.9 x 2.3 x 2.5 cm and has a small sinus tract extending to the skin surface at the dorsomedial aspect of the forefoot (series 5, image 24). No additional fluid collections. IMPRESSION: 1. Status post first ray resection at the level of the first tarsometatarsal joint. Fluid collection with antibiotic beads within the resection bed measuring 6.9 x 2.3 x 2.5 cm and has a small sinus tract extending to the skin surface at the dorsomedial aspect  of the forefoot. An infected postoperative collection is not excluded. 2. Acute osteomyelitis of the proximal phalanx of the left second toe. 3. Early acute osteomyelitis of the second metatarsal, most convincing at the second metatarsal head. 4. Acute osteomyelitis of the medial cuneiform. 5. Suspect nondisplaced fracture involving the distal phalanx of the fifth toe with associated bone marrow edema. These results will be called to the ordering clinician or representative by the Radiologist Assistant, and communication documented in the PACS or [employer]. Electronically Signed   By: Duanne Guess D.O.   On: 03/18/2021 11:16     Medications   Scheduled Meds:  DULoxetine  30 mg Oral Daily   gabapentin  300 mg Oral BID   insulin aspart  0-20 Units Subcutaneous Q4H   insulin glargine  5 Units Subcutaneous BID   oxyCODONE  15 mg Oral Q6H   pantoprazole  40 mg Oral Daily   senna-docusate  2 tablet Oral QHS   zinc sulfate  220 mg Oral Daily   Continuous Infusions:  cefTRIAXone (ROCEPHIN)  IV 2 g (03/19/21 0920)   methocarbamol (ROBAXIN) IV 500 mg (03/19/21 0622)   metronidazole 500 mg (03/19/21 1259)   vancomycin 1,500 mg (03/19/21 0932)       LOS: 2 days    Time spent: 25 minutes with > 50% spent at bedside and coordination of care    Pennie Banter, DO [facility]  03/19/2021, 2:30 PM      If 7PM-7AM, please contact night-coverage. How to contact the [facility] Attending or Consulting provider 7A - 7P or covering provider during after hours 7P -7A, for this patient?    Check the care team in [facility] and look for a) attending/consulting TR[facility] provider listed and b) the [facility] team listed Log into [email or web address] and use [facility] universal password to access. If you do not have the password, please contact the hospital operator. Locate the [facility] provider you are looking for under [facility] and page to a number that  you can be directly reached. If you still have  difficulty reaching the provider, please page the [facility] (Director on Call) for the Hospitalists listed on amion for assistance.

## 2021-03-20 DIAGNOSIS — M25572 Pain in left ankle and joints of left foot: Secondary | ICD-10-CM | POA: Diagnosis not present

## 2021-03-20 DIAGNOSIS — L089 Local infection of the skin and subcutaneous tissue, unspecified: Secondary | ICD-10-CM | POA: Diagnosis not present

## 2021-03-20 LAB — BASIC METABOLIC PANEL
Anion gap: 7 (ref 5–15)
BUN: 21 mg/dL — ABNORMAL HIGH (ref 6–20)
CO2: 24 mmol/L (ref 22–32)
Calcium: 9 mg/dL (ref 8.9–10.3)
Chloride: 105 mmol/L (ref 98–111)
Creatinine, Ser: 0.82 mg/dL (ref 0.61–1.24)
GFR, Estimated: >60 mL/min (ref >60)
Glucose, Bld: 104 mg/dL — ABNORMAL HIGH (ref 70–99)
Potassium: 4.8 mmol/L (ref 3.5–5.1)
Sodium: 136 mmol/L (ref 135–145)

## 2021-03-20 LAB — GLUCOSE, CAPILLARY
Glucose-Capillary: 106 mg/dL — ABNORMAL HIGH (ref 70–99)
Glucose-Capillary: 157 mg/dL — ABNORMAL HIGH (ref 70–99)
Glucose-Capillary: 185 mg/dL — ABNORMAL HIGH (ref 70–99)
Glucose-Capillary: 191 mg/dL — ABNORMAL HIGH (ref 70–99)
Glucose-Capillary: 216 mg/dL — ABNORMAL HIGH (ref 70–99)
Glucose-Capillary: 260 mg/dL — ABNORMAL HIGH (ref 70–99)
Glucose-Capillary: 94 mg/dL (ref 70–99)
Glucose-Capillary: 95 mg/dL (ref 70–99)

## 2021-03-20 LAB — MRSA NEXT GEN BY PCR, NASAL: MRSA by PCR Next Gen: NOT DETECTED

## 2021-03-20 MED ORDER — HYDROCHLOROTHIAZIDE 25 MG PO TABS
12.5000 mg | ORAL_TABLET | Freq: Every day | ORAL | Status: DC
  Administered 2021-03-20: 12.5 mg via ORAL
  Filled 2021-03-20: qty 1

## 2021-03-20 NOTE — Progress Notes (Signed)
Patient ID: Brett White, male   DOB: 1961-05-15, 60 y.o.   MRN: [id number] Patient is complaining of persistent significant pain.  Review of the skin there is no cellulitis no clinical signs.  The MRI scan was increased edema in the second metatarsal head of the second metatarsal.  This is due to stress from walking on his foot versus the possibility of infection..  I have discussed with patient treatment options which would include continued IV antibiotics versus a below the knee amputation.  Patient had expressed interest in the below the knee amputation secondary to his elevated pain.  I will rediscuss this with patient this evening for possible surgical intervention.

## 2021-03-20 NOTE — Progress Notes (Signed)
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 Lajoyce Cornersuda following  AKI, likely prerenal in the setting of dehydration from nausea vomiting and poor oral intake Baseline Cr 1.0, presented w/Cr 1.41. --Avoid nephrotoxic agents, dehydration, hypotension. --Given IV hydration x 2 days. --Follow BMP   Intractable nausea and vomiting Onset on the day of presentation. --Treated with IV fluids --IV antiemetics as needed.   Type 2 diabetes with hyperglycemia - uncontrolled, A1c 11.5 on 02/03/2021 --Lantus 5 unit BID --Insulin sliding scale, resistant scale   Diabetes polyneuropathy - gabapentin continued at lower dose given need to higher opioid doses currently  Hypertension - home HCTZ was on hold, resume   Chronic anxiety/depression - duloxetine   GERD - on PPI   Opioid-induced constipation --Senokot scheduled, MiraLAX PRN   Ambulatory dysfunction PT and OT  Fall precautions     Patient BMI: Body mass index is 27.6 kg/m.   DVT prophylaxis: SCDs Start: 03/17/21 0423   Diet:  Diet Orders (From admission, onward)     Start     Ordered   03/17/21 0957  Diet Carb Modified Fluid consistency: Thin; Room service appropriate? Yes  Diet effective now       Question Answer Comment  Diet-HS Snack? Nothing   Calorie Level Medium 1600-2000   Fluid consistency: Thin   Room service appropriate? Yes      03/17/21  0956              Code Status: Full Code   Brief Narrative / Hospital Course to Date:   Per H&P by Dr. Margo AyeHall: "Brett ChurchJames Ziff is a 60 y.o. male with medical history significant for Left great toe osteomyelitis, left foot abscess status post revision first ray amputation and debridement on 03/03/2021, type 2 diabetes, diabetic polyneuropathy, essential hypertension, chronic anxiety/depression, GERD, who presented [facility] due to worsening severe left foot pain of 5 days duration.  Associated with erythema, edema for the past 2 days.  On the day of presentation he reports intractable nausea and vomiting x4 times nonbloody emesis, could not keep anything down.  He presented to the ED for further evaluation.  Work-up in the ED revealed postoperative amputation of the left first ray at the tarsometatarsal level, no significant change in the appearance since previous study.  Due to concern for recurrent osteomyelitis versus cellulitis and AKI.  He was started on IV antibiotics empirically, IV fluid hydration and pain control.  TRH, hospitalist team, was asked to admit.   ED Course:  Temperature 98.4.  BP 140/84, pulse 83, respiratory 18, O2 saturation 98% on room air.  Lab studies remarkable for serum sodium 137, potassium 4.2, serum glucose 240, BUN 39, creatinine 1.41, anion gap 12, serum bicarb 27, GFR 58.  Lactic acid 2.4."    Subjective 03/20/21    Pt still having a great deal of pain, has been up since 3 AM.  States  he woke up in a sweat early AM and had an episode of nausea and vomited.  Feeling okay now.     Disposition Plan & Communication   Status is: Inpatient  Remains inpatient appropriate because:Ongoing active pain requiring inpatient pain management, Ongoing diagnostic testing needed not appropriate for outpatient work up, and possible surgery  Dispo:  Patient From: Home  Planned Disposition: Skilled Nursing Facility  Medically stable for discharge: No       Consults,  Procedures, Significant Events   Consultants:  Orthopedic surgery, Dr. Lajoyce Corners  Procedures:  none  Antimicrobials:  Anti-infectives (From admission, onward)    Start     Dose/Rate Route Frequency Ordered Stop   03/18/21 2000  vancomycin (VANCOREADY) IVPB 1500 mg/300 mL        1,500 mg 150 mL/hr over 120 Minutes Intravenous Every 12 hours 03/18/21 1122     03/17/21 2000  vancomycin (VANCOREADY) IVPB 1250 mg/250 mL  Status:  Discontinued        1,250 mg 166.7 mL/hr over 90 Minutes Intravenous Every 12 hours 03/17/21 0528 03/18/21 1122   03/17/21 0430  vancomycin (VANCOREADY) IVPB 2000 mg/400 mL        2,000 mg 200 mL/hr over 120 Minutes Intravenous  Once 03/17/21 0429 03/17/21 0727   03/17/21 0415  cefTRIAXone (ROCEPHIN) 2 g in sodium chloride 0.9 % 100 mL IVPB        2 g 200 mL/hr over 30 Minutes Intravenous Every 24 hours 03/17/21 0407     03/17/21 0415  metroNIDAZOLE (FLAGYL) IVPB 500 mg        500 mg 100 mL/hr over 60 Minutes Intravenous Every 8 hours 03/17/21 0407           Micro    Objective   Vitals:   03/19/21 0700 03/19/21 1124 03/19/21 2041 03/20/21 0819  BP: (!) 152/92 (!) 144/82 (!) 139/91 (!) 152/100  Pulse: 78 78  79  Resp: 18 19 18 18   Temp: 98.7 F (37.1 C) 99 F (37.2 C) 97.8 F (36.6 C) 97.7 F (36.5 C)  TempSrc: Oral Oral Oral   SpO2:   97% 100%  Weight:      Height:        Intake/Output Summary (Last 24 hours) at 03/20/2021 1505 Last data filed at 03/20/2021 1129 Gross per 24 hour  Intake 1690 ml  Output 1150 ml  Net 540 ml   Filed Weights   03/17/21 0641  Weight: 97.5 kg    Physical Exam:  General exam: awake, alert, no acute distress Respiratory system: normal respiratory effort, on room air. Cardiovascular system: RRR, no pedal edema.   Central nervous system: normal speech, A&Ox4 Extremities: Right foot with clean/dry/intact dressing in place, moves all, normal tone Psychiatry: depressed mood, congruent affect, judgement and  insight appear normal  Labs   Data Reviewed: I have personally reviewed following labs and imaging studies  CBC: Recent Labs  Lab 03/16/21 2358 03/17/21 0644 03/18/21 0659 03/19/21 0115  WBC 7.8 7.0 4.8 5.7  NEUTROABS 5.8  --   --   --   HGB 13.4 12.4* 11.7* 12.0*  HCT 41.5 38.3* 36.1* 36.4*  MCV 88.3 86.7 88.3 87.7  PLT 349 295 265 277   Basic Metabolic Panel: Recent Labs  Lab 03/16/21 2358 03/17/21 0644 03/18/21 0659 03/19/21 0115 03/20/21 0908  NA 137 139 136 138 136  K 4.2 3.8 3.9 5.2* 4.8  CL 98 100 103 105 105  CO2 27 27 26  27 24  GLUCOSE 240* 189* 137* 173* 104*  BUN 39* 40* 31* 30* 21*  CREATININE 1.41* 1.21 0.90 0.89 0.82  CALCIUM 9.6 9.3 8.9 8.8* 9.0  MG  --  1.8  --   --   --   PHOS  --  3.5  --   --   --    GFR: Estimated Creatinine Clearance: 115.6 mL/min (by C-G formula based on SCr of 0.82 mg/dL). Liver Function Tests: Recent Labs  Lab 03/16/21 2358  AST 23  ALT 29  ALKPHOS 75  BILITOT 0.4  PROT 8.3*  ALBUMIN 3.8   No results for input(s): LIPASE, AMYLASE in the last 168 hours. No results for input(s): AMMONIA in the last 168 hours. Coagulation Profile: No results for input(s): INR, PROTIME in the last 168 hours. Cardiac Enzymes: No results for input(s): CKTOTAL, CKMB, CKMBINDEX, TROPONINI in the last 168 hours. BNP (last 3 results) No results for input(s): PROBNP in the last 8760 hours. HbA1C: No results for input(s): HGBA1C in the last 72 hours. CBG: Recent Labs  Lab 03/19/21 2010 03/19/21 2358 03/20/21 0419 03/20/21 0815 03/20/21 1128  GLUCAP 274* 106* 260* 95 216*   Lipid Profile: No results for input(s): CHOL, HDL, LDLCALC, TRIG, CHOLHDL, LDLDIRECT in the last 72 hours. Thyroid Function Tests: No results for input(s): TSH, T4TOTAL, FREET4, T3FREE, THYROIDAB in the last 72 hours. Anemia Panel: No results for input(s): VITAMINB12, FOLATE, FERRITIN, TIBC, IRON, RETICCTPCT in the last 72 hours. Sepsis Labs: Recent Labs   Lab 03/17/21 0000  LATICACIDVEN 2.4*    Recent Results (from the past 240 hour(s))  SARS CORONAVIRUS 2 (TAT 6-24 HRS) Nasopharyngeal Nasopharyngeal Swab     Status: None   Collection Time: 03/17/21  4:55 AM   Specimen: Nasopharyngeal Swab  Result Value Ref Range Status   SARS Coronavirus 2 NEGATIVE NEGATIVE Final    Comment: (NOTE) SARS-CoV-2 target nucleic acids are NOT DETECTED.  The SARS-CoV-2 RNA is generally detectable in upper and lower respiratory specimens during the acute phase of infection. Negative results do not preclude SARS-CoV-2 infection, do not rule out co-infections with other pathogens, and should not be used as the sole basis for treatment or other patient management decisions. Negative results must be combined with clinical observations, patient history, and epidemiological information. The expected result is Negative.  Fact Sheet for Patients: HairSlick.no  Fact Sheet for Healthcare Providers: quierodirigir.com  This test is not yet approved or cleared by the [place] FDA and  has been authorized for detection and/or diagnosis of SARS-CoV-2 by FDA under an Emergency Use Authorization (EUA). This EUA will remain  in effect (meaning this test can be used) for the duration of the COVID-19 declaration under Se ction 564(b)(1) of the Act, 21 U.S.C. section 360bbb-3(b)(1), unless the authorization is terminated or revoked sooner.  Performed at [facility], [address], [place], [place] [address]       Imaging Studies   No results found.   Medications   Scheduled Meds:  DULoxetine  30 mg Oral Daily   gabapentin  300 mg Oral BID   hydrochlorothiazide  12.5 mg Oral Daily   insulin aspart  0-20 Units Subcutaneous Q4H   insulin glargine  5 Units Subcutaneous BID   oxyCODONE  15 mg Oral Q6H   pantoprazole  40 mg Oral Daily   senna-docusate  2 tablet Oral QHS   zinc sulfate  220  mg Oral Daily   Continuous Infusions:  cefTRIAXone (  ROCEPHIN)  IV 2 g (03/20/21 0819)   methocarbamol (ROBAXIN) IV 500 mg (03/20/21 1026)   metronidazole 500 mg (03/20/21 1251)   vancomycin 1,500 mg (03/20/21 1028)       LOS: 3 days    Time spent: 25 minutes with > 50% spent at bedside and coordination of care    Pennie Banter, DO [facility]  03/20/2021, 3:05 PM      If 7PM-7AM, please contact night-coverage. How to contact the [facility] Attending or Consulting provider 7A - 7P or covering provider during after hours 7P -7A, for this patient?    Check the care team in [facility] and look for a) attending/consulting [facility] provider listed and b) the [facility] team listed Log into [email or web address] and use [facility]'s universal password to access. If you do not have the password, please contact the hospital operator. Locate the [facility] provider you are looking for under [facility] and page to a number that you can be directly reached. If you still have difficulty reaching the provider, please page the [facility] (Director on Call) for the Hospitalists listed on amion for assistance.

## 2021-03-20 NOTE — H&P (View-Only) (Signed)
Patient ID: Brett White, male   DOB: 1961/04/13, 60 y.o.   MRN: [id number] Patient states he still has significant pain in his left foot.  Patient states that he had night sweats last night and nausea and vomiting.  We reviewed his MRI scan findings which shows stress reaction in the second metatarsal.  Patient states that with his significant pain he would like to proceed with a transtibial amputation.  He states he has talked to friends and nurses who agree with the amputation.  I will plan for the amputation on Wednesday.  Anticipate patient would need skilled nursing for discharge.

## 2021-03-20 NOTE — Progress Notes (Signed)
Patient ID: Brett White, male   DOB: 08/03/1961, 59 y.o.   MRN: [id number] Patient states he still has significant pain in his left foot.  Patient states that he had night sweats last night and nausea and vomiting.  We reviewed his MRI scan findings which shows stress reaction in the second metatarsal.  Patient states that with his significant pain he would like to proceed with a transtibial amputation.  He states he has talked to friends and nurses who agree with the amputation.  I will plan for the amputation on Wednesday.  Anticipate patient would need skilled nursing for discharge. 

## 2021-03-20 NOTE — Plan of Care (Signed)

## 2021-03-20 NOTE — Plan of Care (Signed)
?  Problem: Health Behavior/Discharge Planning: ?Goal: Ability to manage health-related needs will improve ?Outcome: Not Progressing ?  ?Problem: Pain Managment: ?Goal: General experience of comfort will improve ?Outcome: Not Progressing ?  ?

## 2021-03-21 ENCOUNTER — Other Ambulatory Visit: Payer: Self-pay | Admitting: Physician Assistant

## 2021-03-21 LAB — GLUCOSE, CAPILLARY
Glucose-Capillary: 129 mg/dL — ABNORMAL HIGH (ref 70–99)
Glucose-Capillary: 115 mg/dL — ABNORMAL HIGH (ref 70–99)
Glucose-Capillary: 99 mg/dL (ref 70–99)
Glucose-Capillary: 181 mg/dL — ABNORMAL HIGH (ref 70–99)
Glucose-Capillary: 199 mg/dL — ABNORMAL HIGH (ref 70–99)
Glucose-Capillary: 232 mg/dL — ABNORMAL HIGH (ref 70–99)

## 2021-03-21 LAB — TYPE AND SCREEN
ABO/RH(D): A POS
Antibody Screen: NEGATIVE

## 2021-03-21 LAB — ABO/RH: ABO/RH(D): A POS

## 2021-03-21 MED ORDER — OXYCODONE HCL 5 MG PO TABS
20.0000 mg | ORAL_TABLET | Freq: Four times a day (QID) | ORAL | Status: DC
  Administered 2021-03-21 – 2021-03-29 (×32): 20 mg via ORAL
  Filled 2021-03-21 (×32): qty 4

## 2021-03-21 MED ORDER — HYDRALAZINE HCL 25 MG PO TABS: 25.0000 mg | ORAL_TABLET | Freq: Three times a day (TID) | ORAL | Status: DC | PRN

## 2021-03-21 MED ORDER — LOSARTAN POTASSIUM 50 MG PO TABS
25.0000 mg | ORAL_TABLET | Freq: Every day | ORAL | Status: DC
  Administered 2021-03-21 – 2021-03-29 (×8): 25 mg via ORAL
  Filled 2021-03-21 (×8): qty 1

## 2021-03-21 MED ORDER — CEFAZOLIN SODIUM-DEXTROSE 2-4 GM/100ML-% IV SOLN
2.0000 g | INTRAVENOUS | Status: AC
  Administered 2021-03-22: 2 g via INTRAVENOUS
  Filled 2021-03-21 (×2): qty 100

## 2021-03-21 MED ORDER — HYDROCHLOROTHIAZIDE 25 MG PO TABS: 25.0000 mg | ORAL_TABLET | Freq: Every day | ORAL | Status: DC

## 2021-03-21 NOTE — Progress Notes (Signed)
PT Cancellation Note  Patient Details Name: Brett White MRN: [id number] DOB: March 13, 1961   Cancelled Treatment:    Reason Eval/Treat Not Completed: (P) Fatigue/lethargy limiting ability to participate (per RN, pt did not sleep last night and finally asleep, requests PTA not wake him up. Per chart review, pt did work with OT in AM.) Will continue efforts per PT POC as schedule permits.   Dorathy Kinsman Travon Crochet 03/21/2021, 5:39 PM

## 2021-03-21 NOTE — Progress Notes (Signed)
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 Lajoyce Corners consulted, plan for transtibial amputation Wed 6/22  AKI, likely prerenal in the setting of dehydration from nausea vomiting and poor oral intake Baseline Cr 1.0, presented w/Cr 1.41. --Avoid nephrotoxic agents, dehydration, hypotension. --Given IV hydration x 2 days. --Follow BMP   Intractable nausea and vomiting Onset on the day of presentation. --Treated with IV fluids --IV antiemetics as needed.   Type 2 diabetes with hyperglycemia - uncontrolled, A1c 11.5 on 02/03/2021 --Lantus 5 unit BID --Insulin sliding scale, resistant scale   Diabetes polyneuropathy - gabapentin continued at lower dose given need to higher opioid doses currently  Hypertension -was on HCTZ 12.5 mg at home, initially held.  BPs now elevated but started on losartan in place of HCTZ given proteinuria.   Chronic anxiety/depression - duloxetine   GERD - on PPI   Opioid-induced constipation --Senokot scheduled, MiraLAX PRN   Ambulatory dysfunction PT and OT  Fall precautions     Patient BMI: Body mass index is 27.6 kg/m.   DVT prophylaxis: SCDs Start: 03/17/21 0423   Diet:  Diet Orders (From admission, onward)     Start     Ordered   03/17/21 0957  Diet Carb Modified Fluid consistency: Thin; Room service appropriate? Yes  Diet effective now        Question Answer Comment  Diet-HS Snack? Nothing   Calorie Level Medium 1600-2000   Fluid consistency: Thin   Room service appropriate? Yes      03/17/21 0956              Code Status: Full Code   Brief Narrative / Hospital Course to Date:   Per H&P by Dr. Margo Aye: "Brett White is a 60 y.o. male with medical history significant for Left great toe osteomyelitis, left foot abscess status post revision first ray amputation and debridement on 03/03/2021, type 2 diabetes, diabetic polyneuropathy, essential hypertension, chronic anxiety/depression, GERD, who presented to [facility] ED due to worsening severe left foot pain of 5 days duration.  Associated with erythema, edema for the past 2 days.  On the day of presentation he reports intractable nausea and vomiting x4 times nonbloody emesis, could not keep anything down.  He presented to the ED for further evaluation.  Work-up in the ED revealed postoperative amputation of the left first ray at the tarsometatarsal level, no significant change in the appearance since previous study.  Due to concern for recurrent osteomyelitis versus cellulitis and AKI.  He was started on IV antibiotics empirically, IV fluid hydration and pain control.  TRH, hospitalist team, was asked to admit.   ED Course:  Temperature 98.4.  BP 140/84, pulse 83, respiratory 18, O2 saturation 98% on room air.  Lab studies remarkable for serum sodium 137, potassium 4.2, serum glucose 240, BUN 39, creatinine 1.41, anion gap 12, serum bicarb 27, GFR 58.  Lactic acid  2.4."    Subjective 03/21/21    Pt reports he has been awake since 2 AM.  Continues to have uncontrolled pain, the best pain score he has had is 8 out of 10, even after Dilaudid.  He is eager to get through surgery and be out of pain.  He denies any other acute complaints.   Disposition Plan & Communication   Status is: Inpatient  Remains inpatient appropriate because:Ongoing active pain requiring inpatient pain  management, Ongoing diagnostic testing needed not appropriate for outpatient work up, and possible surgery  Dispo:  Patient From: Home  Planned Disposition: Skilled Nursing Facility  Medically stable for discharge: No       Consults, Procedures, Significant Events   Consultants:  Orthopedic surgery, Dr. Lajoyce Corners  Procedures:  none  Antimicrobials:  Anti-infectives (From admission, onward)    Start     Dose/Rate Route Frequency Ordered Stop   03/18/21 2000  vancomycin (VANCOREADY) IVPB 1500 mg/300 mL        1,500 mg 150 mL/hr over 120 Minutes Intravenous Every 12 hours 03/18/21 1122     03/17/21 2000  vancomycin (VANCOREADY) IVPB 1250 mg/250 mL  Status:  Discontinued        1,250 mg 166.7 mL/hr over 90 Minutes Intravenous Every 12 hours 03/17/21 0528 03/18/21 1122   03/17/21 0430  vancomycin (VANCOREADY) IVPB 2000 mg/400 mL        2,000 mg 200 mL/hr over 120 Minutes Intravenous  Once 03/17/21 0429 03/17/21 0727   03/17/21 0415  cefTRIAXone (ROCEPHIN) 2 g in sodium chloride 0.9 % 100 mL IVPB        2 g 200 mL/hr over 30 Minutes Intravenous Every 24 hours 03/17/21 0407     03/17/21 0415  metroNIDAZOLE (FLAGYL) IVPB 500 mg        500 mg 100 mL/hr over 60 Minutes Intravenous Every 8 hours 03/17/21 0407           Micro    Objective   Vitals:   03/20/21 2004 03/21/21 0353 03/21/21 0728 03/21/21 1140  BP: (!) 163/95 (!) 152/94 (!) 156/96 119/77  Pulse: 81 81 79 81  Resp: 18 17 18 19   Temp: 98.3 F (36.8 C) (!) 97.5 F (36.4 C) 98.6 F (37 C) 97.8 F (36.6 C)  TempSrc:  Oral Oral Oral  SpO2: 98% 98% 100% 98%  Weight:      Height:        Intake/Output Summary (Last 24 hours) at 03/21/2021 1454 Last data filed at 03/21/2021 1141 Gross per 24 hour  Intake 600 ml  Output 1850 ml  Net -1250 ml   Filed Weights   03/17/21 0641  Weight: 97.5 kg    Physical Exam:  General exam: awake, alert, no acute distress Respiratory system: CTA B, normal respiratory effort,  monitor. Cardiovascular system: Normal S1/S2, RRR, no pedal edema.   Central nervous system: A&O x4, no gross focal deficits, normal speech Extremities: Right foot with clean/dry/intact dressing in place, distal sensation intact  Psychiatry: depressed mood, congruent affect, judgement and insight appear normal  Labs   Data Reviewed: I have personally reviewed following labs and imaging studies  CBC: Recent Labs  Lab 03/16/21 2358 03/17/21 0644 03/18/21 0659 03/19/21 0115  WBC 7.8 7.0 4.8 5.7  NEUTROABS 5.8  --   --   --   HGB 13.4 12.4* 11.7* 12.0*  HCT 41.5 38.3* 36.1* 36.4*  MCV 88.3 86.7 88.3 87.7  PLT 349 295 265 277  Basic Metabolic Panel: Recent Labs  Lab 03/16/21 2358 03/17/21 0644 03/18/21 0659 03/19/21 0115 03/20/21 0908  NA 137 139 136 138 136  K 4.2 3.8 3.9 5.2* 4.8  CL 98 100 103 105 105  CO2 27 27 26 27 24   GLUCOSE 240* 189* 137* 173* 104*  BUN 39* 40* 31* 30* 21*  CREATININE 1.41* 1.21 0.90 0.89 0.82  CALCIUM 9.6 9.3 8.9 8.8* 9.0  MG  --  1.8  --   --   --   PHOS  --  3.5  --   --   --    GFR: Estimated Creatinine Clearance: 115.6 mL/min (by C-G formula based on SCr of 0.82 mg/dL). Liver Function Tests: Recent Labs  Lab 03/16/21 2358  AST 23  ALT 29  ALKPHOS 75  BILITOT 0.4  PROT 8.3*  ALBUMIN 3.8   No results for input(s): LIPASE, AMYLASE in the last 168 hours. No results for input(s): AMMONIA in the last 168 hours. Coagulation Profile: No results for input(s): INR, PROTIME in the last 168 hours. Cardiac Enzymes: No results for input(s): CKTOTAL, CKMB, CKMBINDEX, TROPONINI in the last 168 hours. BNP (last 3 results) No results for input(s): PROBNP in the last 8760 hours. HbA1C: No results for input(s): HGBA1C in the last 72 hours. CBG: Recent Labs  Lab 03/20/21 2331 03/21/21 0352 03/21/21 0732 03/21/21 0749 03/21/21 1138  GLUCAP 157* 129* 99 115* 181*   Lipid Profile: No results for input(s): CHOL, HDL, LDLCALC, TRIG,  CHOLHDL, LDLDIRECT in the last 72 hours. Thyroid Function Tests: No results for input(s): TSH, T4TOTAL, FREET4, T3FREE, THYROIDAB in the last 72 hours. Anemia Panel: No results for input(s): VITAMINB12, FOLATE, FERRITIN, TIBC, IRON, RETICCTPCT in the last 72 hours. Sepsis Labs: Recent Labs  Lab 03/17/21 0000  LATICACIDVEN 2.4*    Recent Results (from the past 240 hour(s))  SARS CORONAVIRUS 2 (TAT 6-24 HRS) Nasopharyngeal Nasopharyngeal Swab     Status: None   Collection Time: 03/17/21  4:55 AM   Specimen: Nasopharyngeal Swab  Result Value Ref Range Status   SARS Coronavirus 2 NEGATIVE NEGATIVE Final    Comment: (NOTE) SARS-CoV-2 target nucleic acids are NOT DETECTED.  The SARS-CoV-2 RNA is generally detectable in upper and lower respiratory specimens during the acute phase of infection. Negative results do not preclude SARS-CoV-2 infection, do not rule out co-infections with other pathogens, and should not be used as the sole basis for treatment or other patient management decisions. Negative results must be combined with clinical observations, patient history, and epidemiological information. The expected result is Negative.  Fact Sheet for Patients: 03/19/21  Fact Sheet for Healthcare Providers: HairSlick.no  This test is not yet approved or cleared by the quierodirigir.com FDA and  has been authorized for detection and/or diagnosis of SARS-CoV-2 by FDA under an Emergency Use Authorization (EUA). This EUA will remain  in effect (meaning this test can be used) for the duration of the COVID-19 declaration under Se ction 564(b)(1) of the Act, 21 U.S.C. section 360bbb-3(b)(1), unless the authorization is terminated or revoked sooner.  Performed at [facility], [address], [place], [place]   MRSA Next Gen by PCR, Nasal     Status: None   Collection Time: 03/20/21  3:33 PM   Specimen: Nasal  Mucosa; Nasal Swab  Result Value Ref Range Status   MRSA by PCR Next Gen NOT DETECTED NOT DETECTED Final    Comment: (NOTE) The GeneXpert MRSA Assay (FDA  approved for NASAL specimens only), is one component of a comprehensive MRSA colonization surveillance program. It is not intended to diagnose MRSA infection nor to guide or monitor treatment for MRSA infections. Test performance is not FDA approved in patients less than 58 years old. Performed at [facility], [address] [place], [place] [address]       Imaging Studies   No results found.   Medications   Scheduled Meds:  DULoxetine  30 mg Oral Daily   gabapentin  300 mg Oral BID   insulin aspart  0-20 Units Subcutaneous Q4H   insulin glargine  5 Units Subcutaneous BID   losartan  25 mg Oral Daily   oxyCODONE  20 mg Oral Q6H   pantoprazole  40 mg Oral Daily   senna-docusate  2 tablet Oral QHS   zinc sulfate  220 mg Oral Daily   Continuous Infusions:  cefTRIAXone (ROCEPHIN)  IV 2 g (03/21/21 0753)   methocarbamol (ROBAXIN) IV 500 mg (03/21/21 56210702)   metronidazole 500 mg (03/21/21 0538)   vancomycin 1,500 mg (03/21/21 1015)       LOS: 4 days    Time spent: 25 minutes with > 50% spent at bedside and coordination of care    Pennie BanterKelly A Jayliana Valencia, DO [facility]  03/21/2021, 2:54 PM      If 7PM-7AM, please contact night-coverage. How to contact the [facility] Attending or Consulting provider 7A - 7P or covering provider during after hours 7P -7A, for this patient?    Check the care team in [facility] and look for a) attending/consulting [facility] provider listed and b) the [facility] team listed Log into [email or web address] and use [place]'s universal password to access. If you do not have the password, please contact the hospital operator. Locate the [facility] provider you are looking for under [facility] and page to a number that you can be directly reached. If you still have difficulty reaching the provider, please  page the [facility] (Director on Call) for the Hospitalists listed on amion for assistance.

## 2021-03-21 NOTE — Progress Notes (Signed)
Pharmacy Antibiotic Note  Brett White is a 60 y.o. male admitted on 03/16/2021 with recurrent LLE osteomyelitis. Patient had first ray resection 02/04/21 and I&D 6/3. Pharmacy has been consulted for Vancomycin  dosing.  Patient is also on ceftriaxone and metronidazole per MD.  Renal function stable on 1.5g Q 12 hr. Planned BKA 6/22. Will get vanc level if continuing.   Plan: Continue  vancomycin to 1500 mg IV q 12h (eAUC 416, Goal AUC 400-550 Monitor renal function, vanc levels, LOT    Height: 6\' 2"  (188 cm) Weight: 97.5 kg (215 lb) IBW/kg (Calculated) : 82.2  Temp (24hrs), Avg:98.1 F (36.7 C), Min:97.5 F (36.4 C), Max:98.6 F (37 C)  Recent Labs  Lab 03/16/21 2358 03/17/21 0000 03/17/21 0644 03/18/21 0659 03/19/21 0115 03/20/21 0908  WBC 7.8  --  7.0 4.8 5.7  --   CREATININE 1.41*  --  1.21 0.90 0.89 0.82  LATICACIDVEN  --  2.4*  --   --   --   --      Estimated Creatinine Clearance: 115.6 mL/min (by C-G formula based on SCr of 0.82 mg/dL).    No Known Allergies    03/22/21, PharmD, BCPS, BCCP Clinical Pharmacist  Please check AMION for all [facility] phone numbers After 10:00 PM, call Main Pharmacy [phone]

## 2021-03-21 NOTE — Evaluation (Signed)
Occupational Therapy Evaluation Patient Details Name: Brett White MRN: [id number] DOB: 12/16/1960 Today's Date: 03/21/2021    History of Present Illness Pt is 60 yo male who had L 1st ray amputation 02/04/21 and subsequent I&D 03/03/21 and presents with persistent pain in that foot with 2 episodes of N&V. PMH: DM2, back sx, GERD, neuropathy, anxiety/ depression.   Clinical Impression   Pt admitted with above but is now pending further sx tomorrow. Pt at this time was set up for shower with 3 in1 commode in sitting position.  Pt requires supervision to min guard for safety cues with task. Pt's nurse set up and made aware about when shower was completed. Pt currently with functional limitations due to the deficits listed below (see OT Problem List).  Pt will benefit from skilled OT to increase their safety and independence with ADL and functional mobility for ADL to facilitate discharge to venue listed below.      Follow Up Recommendations  SNF;Other (comment) (Pt at this time could go [facility] but having sx tomorrow)    Equipment Recommendations  None recommended by OT    Recommendations for Other Services       Precautions / Restrictions Precautions Precautions: Fall Precaution Comments: NWB LLE with improved maintenance Required Braces or Orthoses: Other Brace      Mobility Bed Mobility Overal bed mobility: Modified Independent Bed Mobility: Supine to Sit     Supine to sit: Modified independent (Device/Increase time) Sit to supine: Modified independent (Device/Increase time)   General bed mobility comments: Mod I for bed mobility.    Transfers Overall transfer level: Needs assistance Equipment used: Rolling walker (2 wheeled) Transfers: Sit to/from Stand Sit to Stand: Min guard              Balance Overall balance assessment: Needs assistance Sitting-balance support: Feet supported Sitting balance-Leahy Scale: Good     Standing balance support: Bilateral upper  extremity supported;During functional activity Standing balance-Leahy Scale: Fair                             ADL either performed or assessed with clinical judgement   ADL Overall ADL's : Needs assistance/impaired Eating/Feeding: Set up;Sitting   Grooming: Wash/dry hands;Wash/dry face;Set up Grooming Details (indicate cue type and reason): pt set up for oral care Upper Body Bathing: Set up;Sitting   Lower Body Bathing: Min guard;Sitting/lateral leans   Upper Body Dressing : Set up;Sitting   Lower Body Dressing: Set up;Sitting/lateral leans   Toilet Transfer: Min guard;Ambulation Toilet Transfer Details (indicate cue type and reason): Short distances     Tub/ Shower Transfer: Min guard;3 in 1   Functional mobility during ADLs: Min guard;Rolling walker       Vision   Vision Assessment?: No apparent visual deficits     Perception     Praxis      Pertinent Vitals/Pain Pain Assessment: 0-10 Pain Score: 10-Worst pain ever Pain Location: L foot Pain Descriptors / Indicators: Discomfort Pain Intervention(s): Limited activity within patient's tolerance     Hand Dominance     Extremity/Trunk Assessment Upper Extremity Assessment Upper Extremity Assessment: Generalized weakness   Lower Extremity Assessment Lower Extremity Assessment: Defer to PT evaluation       Communication     Cognition Arousal/Alertness: Awake/alert Behavior During Therapy: WFL for tasks assessed/performed Overall Cognitive Status: Within Functional Limits for tasks assessed  General Comments       Exercises     Shoulder Instructions      Home Living                                          Prior Functioning/Environment                   OT Problem List:        OT Treatment/Interventions:      OT Goals(Current goals can be found in the care plan section) Acute Rehab OT Goals Patient  Stated Goal: To have less pain OT Goal Formulation: With patient Time For Goal Achievement: 04/02/21 Potential to Achieve Goals: Good ADL Goals Pt Will Perform Grooming: with supervision;standing Pt Will Perform Lower Body Bathing: with supervision;sitting/lateral leans;sit to/from stand Pt Will Perform Lower Body Dressing: with supervision;sit to/from stand Pt Will Transfer to Toilet: with supervision;stand pivot transfer Pt Will Perform Toileting - Clothing Manipulation and hygiene: with supervision;sitting/lateral leans;sit to/from stand Pt Will Perform Tub/Shower Transfer: with modified independence;tub bench  OT Frequency: Min 2X/week   Barriers to D/C:            Co-evaluation              AM-PAC OT "6 Clicks" Daily Activity     Outcome Measure Help from another person eating meals?: A Little Help from another person taking care of personal grooming?: A Little Help from another person toileting, which includes using toliet, bedpan, or urinal?: A Little Help from another person bathing (including washing, rinsing, drying)?: A Little Help from another person to put on and taking off regular upper body clothing?: A Little Help from another person to put on and taking off regular lower body clothing?: A Little 6 Click Score: 18   End of Session Equipment Utilized During Treatment: Gait belt;Rolling walker Nurse Communication: Other (comment) (post shower)  Activity Tolerance: Patient tolerated treatment well Patient left: in chair;with call bell/phone within reach  OT Visit Diagnosis: Repeated falls (R29.6);Muscle weakness (generalized) (M62.81);Other abnormalities of gait and mobility (R26.89) Pain - Right/Left: Left Pain - part of body: Ankle and joints of foot                Time: 8099-8338 OT Time Calculation (min): 33 min Charges:  OT General Charges $OT Visit: 1 Visit OT Treatments $Self Care/Home Management : 23-37 mins  Alphia Moh OTR/L  Acute Rehab  Services  [phone] office number [phone] pager number   Alphia Moh 03/21/2021, 1:03 PM

## 2021-03-22 ENCOUNTER — Encounter (HOSPITAL_COMMUNITY): Payer: Self-pay | Admitting: Internal Medicine

## 2021-03-22 ENCOUNTER — Inpatient Hospital Stay (HOSPITAL_COMMUNITY): Payer: Medicare HMO | Admitting: Anesthesiology

## 2021-03-22 ENCOUNTER — Encounter (HOSPITAL_COMMUNITY)
Admission: EM | Disposition: A | Discharge status: Skilled Nursing Facility | Payer: Self-pay | Source: Home / Self Care | Attending: Family Medicine

## 2021-03-22 DIAGNOSIS — M86172 Other acute osteomyelitis, left ankle and foot: Secondary | ICD-10-CM

## 2021-03-22 DIAGNOSIS — M25572 Pain in left ankle and joints of left foot: Secondary | ICD-10-CM | POA: Diagnosis not present

## 2021-03-22 DIAGNOSIS — L089 Local infection of the skin and subcutaneous tissue, unspecified: Secondary | ICD-10-CM | POA: Diagnosis not present

## 2021-03-22 DIAGNOSIS — E119 Type 2 diabetes mellitus without complications: Secondary | ICD-10-CM

## 2021-03-22 HISTORY — PX: AMPUTATION: SHX166

## 2021-03-22 LAB — GLUCOSE, CAPILLARY
Glucose-Capillary: 102 mg/dL — ABNORMAL HIGH (ref 70–99)
Glucose-Capillary: 115 mg/dL — ABNORMAL HIGH (ref 70–99)
Glucose-Capillary: 131 mg/dL — ABNORMAL HIGH (ref 70–99)
Glucose-Capillary: 154 mg/dL — ABNORMAL HIGH (ref 70–99)
Glucose-Capillary: 161 mg/dL — ABNORMAL HIGH (ref 70–99)
Glucose-Capillary: 171 mg/dL — ABNORMAL HIGH (ref 70–99)
Glucose-Capillary: 187 mg/dL — ABNORMAL HIGH (ref 70–99)

## 2021-03-22 LAB — BASIC METABOLIC PANEL
Anion gap: 6 (ref 5–15)
BUN: 28 mg/dL — ABNORMAL HIGH (ref 6–20)
CO2: 28 mmol/L (ref 22–32)
Calcium: 9 mg/dL (ref 8.9–10.3)
Chloride: 103 mmol/L (ref 98–111)
Creatinine, Ser: 1.09 mg/dL (ref 0.61–1.24)
GFR, Estimated: >60 mL/min (ref >60)
Glucose, Bld: 111 mg/dL — ABNORMAL HIGH (ref 70–99)
Potassium: 4.8 mmol/L (ref 3.5–5.1)
Sodium: 137 mmol/L (ref 135–145)

## 2021-03-22 SURGERY — AMPUTATION BELOW KNEE
Anesthesia: Regional | Site: Knee | Laterality: Left

## 2021-03-22 MED ORDER — ZINC SULFATE 220 (50 ZN) MG PO CAPS: 220.0000 mg | ORAL_CAPSULE | Freq: Every day | ORAL | Status: DC

## 2021-03-22 MED ORDER — OXYCODONE HCL 5 MG PO TABS
5.0000 mg | ORAL_TABLET | ORAL | Status: DC | PRN
  Administered 2021-03-25: 10 mg via ORAL
  Administered 2021-03-29: 5 mg via ORAL
  Filled 2021-03-22 (×3): qty 2

## 2021-03-22 MED ORDER — MIDAZOLAM HCL 2 MG/2ML IJ SOLN
INTRAMUSCULAR | Status: AC
  Administered 2021-03-22: 2 mg via INTRAVENOUS
  Filled 2021-03-22: qty 2

## 2021-03-22 MED ORDER — OXYCODONE HCL 5 MG/5ML PO SOLN: 5.0000 mg | Freq: Once | ORAL | Status: DC | PRN

## 2021-03-22 MED ORDER — ACETAMINOPHEN 325 MG PO TABS
325.0000 mg | ORAL_TABLET | Freq: Four times a day (QID) | ORAL | Status: DC | PRN
  Administered 2021-03-22: 650 mg via ORAL
  Filled 2021-03-22: qty 2

## 2021-03-22 MED ORDER — KETOROLAC TROMETHAMINE 30 MG/ML IJ SOLN
INTRAMUSCULAR | Status: AC
  Administered 2021-03-22: 30 mg via INTRAVENOUS
  Filled 2021-03-22: qty 1

## 2021-03-22 MED ORDER — FENTANYL CITRATE (PF) 100 MCG/2ML IJ SOLN: 100.0000 ug | Freq: Once | INTRAMUSCULAR | Status: AC

## 2021-03-22 MED ORDER — ONDANSETRON HCL 4 MG/2ML IJ SOLN
INTRAMUSCULAR | Status: AC
  Filled 2021-03-22: qty 2

## 2021-03-22 MED ORDER — BISACODYL 5 MG PO TBEC: 5.0000 mg | DELAYED_RELEASE_TABLET | Freq: Every day | ORAL | Status: DC | PRN

## 2021-03-22 MED ORDER — DOCUSATE SODIUM 100 MG PO CAPS
100.0000 mg | ORAL_CAPSULE | Freq: Every day | ORAL | Status: DC
  Administered 2021-03-23 – 2021-03-29 (×5): 100 mg via ORAL
  Filled 2021-03-22 (×7): qty 1

## 2021-03-22 MED ORDER — OXYCODONE HCL 5 MG PO TABS
10.0000 mg | ORAL_TABLET | ORAL | Status: DC | PRN
  Administered 2021-03-22 – 2021-03-28 (×7): 15 mg via ORAL
  Administered 2021-03-28: 10 mg via ORAL
  Filled 2021-03-22: qty 2
  Filled 2021-03-22 (×8): qty 3

## 2021-03-22 MED ORDER — CHLORHEXIDINE GLUCONATE 0.12 % MT SOLN
OROMUCOSAL | Status: AC
  Administered 2021-03-22: 15 mL via OROMUCOSAL
  Filled 2021-03-22: qty 15

## 2021-03-22 MED ORDER — OXYCODONE HCL 5 MG PO TABS: 5.0000 mg | ORAL_TABLET | Freq: Once | ORAL | Status: DC | PRN

## 2021-03-22 MED ORDER — ORAL CARE MOUTH RINSE: 15.0000 mL | Freq: Once | OROMUCOSAL | Status: AC

## 2021-03-22 MED ORDER — JUVEN PO PACK
1.0000 | PACK | Freq: Two times a day (BID) | ORAL | Status: DC
  Administered 2021-03-23 – 2021-03-29 (×12): 1 via ORAL
  Filled 2021-03-22 (×12): qty 1

## 2021-03-22 MED ORDER — PANTOPRAZOLE SODIUM 40 MG PO TBEC: 40.0000 mg | DELAYED_RELEASE_TABLET | Freq: Every day | ORAL | Status: DC

## 2021-03-22 MED ORDER — MAGNESIUM CITRATE PO SOLN: 1.0000 | Freq: Once | ORAL | Status: DC | PRN

## 2021-03-22 MED ORDER — KETOROLAC TROMETHAMINE 30 MG/ML IJ SOLN: 30.0000 mg | Freq: Once | INTRAMUSCULAR | Status: AC

## 2021-03-22 MED ORDER — PROMETHAZINE HCL 25 MG/ML IJ SOLN: 6.2500 mg | INTRAMUSCULAR | Status: DC | PRN

## 2021-03-22 MED ORDER — MIDAZOLAM HCL 2 MG/2ML IJ SOLN
INTRAMUSCULAR | Status: AC
  Filled 2021-03-22: qty 2

## 2021-03-22 MED ORDER — ONDANSETRON HCL 4 MG/2ML IJ SOLN: 4.0000 mg | Freq: Four times a day (QID) | INTRAMUSCULAR | Status: DC | PRN

## 2021-03-22 MED ORDER — GUAIFENESIN-DM 100-10 MG/5ML PO SYRP: 15.0000 mL | ORAL_SOLUTION | ORAL | Status: DC | PRN

## 2021-03-22 MED ORDER — SODIUM CHLORIDE 0.9 % IV SOLN: INTRAVENOUS | Status: DC

## 2021-03-22 MED ORDER — HYDRALAZINE HCL 20 MG/ML IJ SOLN: 5.0000 mg | INTRAMUSCULAR | Status: DC | PRN

## 2021-03-22 MED ORDER — HYDROMORPHONE HCL 1 MG/ML IJ SOLN: 0.2500 mg | INTRAMUSCULAR | Status: DC | PRN

## 2021-03-22 MED ORDER — PROPOFOL 10 MG/ML IV BOLUS
INTRAVENOUS | Status: DC | PRN
  Administered 2021-03-22: 200 mg via INTRAVENOUS

## 2021-03-22 MED ORDER — PHENYLEPHRINE 40 MCG/ML (10ML) SYRINGE FOR IV PUSH (FOR BLOOD PRESSURE SUPPORT)
PREFILLED_SYRINGE | INTRAVENOUS | Status: DC | PRN
  Administered 2021-03-22: 80 ug via INTRAVENOUS

## 2021-03-22 MED ORDER — BUPIVACAINE-EPINEPHRINE (PF) 0.5% -1:200000 IJ SOLN
INTRAMUSCULAR | Status: DC | PRN
  Administered 2021-03-22: 30 mL via PERINEURAL

## 2021-03-22 MED ORDER — PHENOL 1.4 % MT LIQD: 1.0000 | OROMUCOSAL | Status: DC | PRN

## 2021-03-22 MED ORDER — FENTANYL CITRATE (PF) 100 MCG/2ML IJ SOLN
INTRAMUSCULAR | Status: AC
  Administered 2021-03-22: 50 ug via INTRAVENOUS
  Filled 2021-03-22: qty 2

## 2021-03-22 MED ORDER — HYDROMORPHONE HCL 1 MG/ML IJ SOLN
0.5000 mg | INTRAMUSCULAR | Status: DC | PRN
  Filled 2021-03-22 (×3): qty 1

## 2021-03-22 MED ORDER — MIDAZOLAM HCL 2 MG/2ML IJ SOLN: 2.0000 mg | Freq: Once | INTRAMUSCULAR | Status: AC

## 2021-03-22 MED ORDER — POTASSIUM CHLORIDE CRYS ER 20 MEQ PO TBCR: 20.0000 meq | EXTENDED_RELEASE_TABLET | Freq: Every day | ORAL | Status: DC | PRN

## 2021-03-22 MED ORDER — MAGNESIUM SULFATE 2 GM/50ML IV SOLN
2.0000 g | Freq: Every day | INTRAVENOUS | Status: DC | PRN
  Filled 2021-03-22: qty 50

## 2021-03-22 MED ORDER — FENTANYL CITRATE (PF) 250 MCG/5ML IJ SOLN
INTRAMUSCULAR | Status: AC
  Filled 2021-03-22: qty 5

## 2021-03-22 MED ORDER — POLYETHYLENE GLYCOL 3350 17 G PO PACK: 17.0000 g | PACK | Freq: Every day | ORAL | Status: DC | PRN

## 2021-03-22 MED ORDER — LACTATED RINGERS IV SOLN: INTRAVENOUS | Status: DC

## 2021-03-22 MED ORDER — AMISULPRIDE (ANTIEMETIC) 5 MG/2ML IV SOLN: 10.0000 mg | Freq: Once | INTRAVENOUS | Status: DC | PRN

## 2021-03-22 MED ORDER — ALUM & MAG HYDROXIDE-SIMETH 200-200-20 MG/5ML PO SUSP: 15.0000 mL | ORAL | Status: DC | PRN

## 2021-03-22 MED ORDER — FENTANYL CITRATE (PF) 100 MCG/2ML IJ SOLN
25.0000 ug | INTRAMUSCULAR | Status: DC | PRN
  Administered 2021-03-22: 50 ug via INTRAVENOUS

## 2021-03-22 MED ORDER — ASCORBIC ACID 500 MG PO TABS
1000.0000 mg | ORAL_TABLET | Freq: Every day | ORAL | Status: DC
  Administered 2021-03-23 – 2021-03-29 (×7): 1000 mg via ORAL
  Filled 2021-03-22 (×7): qty 2

## 2021-03-22 MED ORDER — ACETAMINOPHEN 10 MG/ML IV SOLN: 1000.0000 mg | Freq: Once | INTRAVENOUS | Status: DC | PRN

## 2021-03-22 MED ORDER — LABETALOL HCL 5 MG/ML IV SOLN: 10.0000 mg | INTRAVENOUS | Status: DC | PRN

## 2021-03-22 MED ORDER — 0.9 % SODIUM CHLORIDE (POUR BTL) OPTIME
TOPICAL | Status: DC | PRN
  Administered 2021-03-22: 1000 mL

## 2021-03-22 MED ORDER — CEFAZOLIN SODIUM-DEXTROSE 2-4 GM/100ML-% IV SOLN
2.0000 g | Freq: Three times a day (TID) | INTRAVENOUS | Status: AC
  Administered 2021-03-23 (×2): 2 g via INTRAVENOUS
  Filled 2021-03-22 (×2): qty 100

## 2021-03-22 MED ORDER — LIDOCAINE HCL (CARDIAC) PF 100 MG/5ML IV SOSY
PREFILLED_SYRINGE | INTRAVENOUS | Status: DC | PRN
  Administered 2021-03-22: 40 mg via INTRAVENOUS

## 2021-03-22 MED ORDER — HYDROMORPHONE HCL 1 MG/ML IJ SOLN
INTRAMUSCULAR | Status: AC
  Administered 2021-03-22: 0.5 mg via INTRAVENOUS
  Filled 2021-03-22: qty 1

## 2021-03-22 MED ORDER — PROPOFOL 10 MG/ML IV BOLUS
INTRAVENOUS | Status: AC
  Filled 2021-03-22: qty 20

## 2021-03-22 MED ORDER — FENTANYL CITRATE (PF) 100 MCG/2ML IJ SOLN
INTRAMUSCULAR | Status: AC
  Administered 2021-03-22: 100 ug via INTRAVENOUS
  Filled 2021-03-22: qty 2

## 2021-03-22 MED ORDER — CHLORHEXIDINE GLUCONATE 0.12 % MT SOLN: 15.0000 mL | Freq: Once | OROMUCOSAL | Status: AC

## 2021-03-22 MED ORDER — METOPROLOL TARTRATE 5 MG/5ML IV SOLN: 2.0000 mg | INTRAVENOUS | Status: DC | PRN

## 2021-03-22 MED ORDER — ONDANSETRON HCL 4 MG/2ML IJ SOLN
INTRAMUSCULAR | Status: DC | PRN
  Administered 2021-03-22: 4 mg via INTRAVENOUS

## 2021-03-22 MED ORDER — MIDAZOLAM HCL 5 MG/5ML IJ SOLN
INTRAMUSCULAR | Status: DC | PRN
  Administered 2021-03-22: 2 mg via INTRAVENOUS

## 2021-03-22 SURGICAL SUPPLY — 37 items
BLADE SAW RECIP 87.9 MT (BLADE) ×3 IMPLANT
BLADE SURG 21 STRL SS (BLADE) ×3 IMPLANT
BNDG COHESIVE 6X5 TAN STRL LF (GAUZE/BANDAGES/DRESSINGS) IMPLANT
CANISTER WOUND CARE 500ML ATS (WOUND CARE) ×3 IMPLANT
COVER SURGICAL LIGHT HANDLE (MISCELLANEOUS) ×3 IMPLANT
COVER WAND RF STERILE (DRAPES) IMPLANT
CUFF TOURN SGL QUICK 34 (TOURNIQUET CUFF) ×2
CUFF TRNQT CYL 34X4.125X (TOURNIQUET CUFF) ×1 IMPLANT
DRAPE INCISE IOBAN 66X45 STRL (DRAPES) ×3 IMPLANT
DRAPE U-SHAPE 47X51 STRL (DRAPES) ×3 IMPLANT
DRESSING PREVENA PLUS CUSTOM (GAUZE/BANDAGES/DRESSINGS) ×1 IMPLANT
DRSG PREVENA PLUS CUSTOM (GAUZE/BANDAGES/DRESSINGS) ×3
DURAPREP 26ML APPLICATOR (WOUND CARE) ×3 IMPLANT
ELECT REM PT RETURN 9FT ADLT (ELECTROSURGICAL) ×3
ELECTRODE REM PT RTRN 9FT ADLT (ELECTROSURGICAL) ×1 IMPLANT
GLOVE SURG ORTHO LTX SZ9 (GLOVE) ×3 IMPLANT
GLOVE SURG UNDER POLY LF SZ9 (GLOVE) ×3 IMPLANT
GOWN STRL REUS W/ TWL XL LVL3 (GOWN DISPOSABLE) ×2 IMPLANT
GOWN STRL REUS W/TWL XL LVL3 (GOWN DISPOSABLE) ×4
KIT BASIN OR (CUSTOM PROCEDURE TRAY) ×3 IMPLANT
KIT TURNOVER KIT B (KITS) ×3 IMPLANT
MANIFOLD NEPTUNE II (INSTRUMENTS) ×3 IMPLANT
NS IRRIG 1000ML POUR BTL (IV SOLUTION) ×3 IMPLANT
PACK ORTHO EXTREMITY (CUSTOM PROCEDURE TRAY) ×3 IMPLANT
PAD ARMBOARD 7.5X6 YLW CONV (MISCELLANEOUS) ×3 IMPLANT
PREVENA RESTOR ARTHOFORM 46X30 (CANNISTER) ×3 IMPLANT
SPONGE LAP 18X18 RF (DISPOSABLE) IMPLANT
STAPLER VISISTAT 35W (STAPLE) IMPLANT
STOCKINETTE IMPERVIOUS LG (DRAPES) ×3 IMPLANT
SUT ETHILON 2 0 PSLX (SUTURE) IMPLANT
SUT SILK 2 0 (SUTURE) ×2
SUT SILK 2-0 18XBRD TIE 12 (SUTURE) ×1 IMPLANT
SUT VIC AB 1 CTX 27 (SUTURE) ×6 IMPLANT
TOWEL GREEN STERILE (TOWEL DISPOSABLE) ×3 IMPLANT
TUBE CONNECTING 12'X1/4 (SUCTIONS) ×1
TUBE CONNECTING 12X1/4 (SUCTIONS) ×2 IMPLANT
YANKAUER SUCT BULB TIP NO VENT (SUCTIONS) ×3 IMPLANT

## 2021-03-22 NOTE — Plan of Care (Signed)
  Problem: Health Behavior/Discharge Planning: Goal: Ability to manage health-related needs will improve Outcome: Progressing   Problem: Activity: Goal: Risk for activity intolerance will decrease Outcome: Progressing   Problem: Pain Managment: Goal: General experience of comfort will improve Outcome: Not Progressing   

## 2021-03-22 NOTE — Anesthesia Postprocedure Evaluation (Signed)
Anesthesia Post Note  Patient: Brett White  Procedure(s) Performed: LEFT BELOW KNEE AMPUTATION (Left: Knee)     Patient location during evaluation: PACU Anesthesia Type: Regional and General Level of consciousness: awake Pain management: pain level controlled Vital Signs Assessment: post-procedure vital signs reviewed and stable Respiratory status: spontaneous breathing, nonlabored ventilation, respiratory function stable and patient connected to nasal cannula oxygen Cardiovascular status: blood pressure returned to baseline and stable Postop Assessment: no apparent nausea or vomiting Anesthetic complications: no   No notable events documented.  Last Vitals:  Vitals:   03/22/21 1755 03/22/21 1800  BP:  (!) 150/93  Pulse: 82 81  Resp: 12 10  Temp:  36.6 C  SpO2: 100% 100%    Last Pain:  Vitals:   03/22/21 1945  TempSrc:   PainSc: 6                  Brae Schaafsma P Branko Steeves

## 2021-03-22 NOTE — Progress Notes (Signed)
PROGRESS NOTE    Brett White  EYC:[id number] DOB: 03-09-1961 DOA: 03/16/2021 PCP: Patient, [name]   Brief Narrative: Brett White is a 60 y.o. 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  Patient resented secondary to worsening severe left foot pain with associated erythema and edema concerning for possible recurrent osteomyelitis.  Orthopedic surgery was consulted.  Patient was empirically started on vancomycin, ceftriaxone, Flagyl.  Orthopedic surgery with plan for left transmetatarsal amputation.   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 Concern for recurrent osteomyelitis. Orthopedic surgery consulted and are planning left  transmetatarsal amputation. Started empirically on Vancomycin/Ceftriaxone and Flagyl -Continue vancomycin, Ceftriaxone, Flagyl -Orthopedic surgery recommendations: OR today  AKI Creatinine of 1.41 on admission. Resolved.  Intractable nausea/vomiting Resolved.  Diabetes mellitus, type 2 Uncontrolled. Hemoglobin A1C of 11.5% from 5/22.   Diabetic polyneuropathy -Continue gabapentin  Primary hypertension Patient is on hydrochlorothiazide as ano outpatient. Started on losartan inpatient. -Continue losartan  Anxiety Depression Continue Cymbalta  GERD -Continue Protonix  Opioid induced constipation -Continue Senkokot   DVT prophylaxis: SCDs Code Status:   Code Status: Full Code Family Communication: None at bedside Disposition Plan: Discharge home vs SNF pending orthopedic surgery and PT/OT recommendations   Consultants:  Orthopedic  surgery  Procedures:  None  Antimicrobials: Ceftriaxone IV Vancomycin IV Flagyl    Subjective: Foot pain. No other concerns.  Objective: Vitals:   03/21/21 0728 03/21/21 1140 03/21/21 2036 03/22/21 0727  BP: (!) 156/96 119/77 127/81 (!) 141/95  Pulse: 79 81 87 74  Resp: 18 19 19 20   Temp: 98.6 F (37 C) 97.8 F (36.6 C) (!) 97.4 F (36.3 C) 98.4 F (36.9 C)  TempSrc: Oral Oral Oral Oral  SpO2: 100% 98% 98% 97%  Weight:      Height:        Intake/Output Summary (Last 24 hours) at 03/22/2021 03/24/2021 Last data filed at 03/22/2021 0500 Gross per 24 hour  Intake --  Output 800 ml  Net -800 ml   Filed Weights   03/17/21 0641  Weight: 97.5 kg    Examination:  General exam: Appears calm and comfortable Respiratory system: Clear to auscultation. Respiratory effort normal. Cardiovascular system: S1 & S2 heard, RRR. No murmurs, rubs, gallops or clicks. Gastrointestinal system: Abdomen is nondistended, soft and nontender. No organomegaly or masses felt. Normal bowel sounds heard. Central nervous system: Alert and oriented. No focal neurological deficits. Musculoskeletal: No edema. No calf tenderness. Left foot wrapped. Skin: No cyanosis. No rashes Psychiatry: Judgement and insight appear normal. Mood & affect appropriate.     Data Reviewed: I have personally reviewed following labs and imaging studies  CBC Lab Results  Component Value Date   WBC 5.7 03/19/2021   RBC 4.15 (L) 03/19/2021   HGB 12.0 (L) 03/19/2021   HCT 36.4 (L) 03/19/2021   MCV 87.7 03/19/2021   MCH 28.9 03/19/2021   PLT 277 03/19/2021   MCHC 33.0 03/19/2021   RDW 13.0 03/19/2021   LYMPHSABS 1.3 03/16/2021   MONOABS 0.6 03/16/2021   EOSABS 0.0 03/16/2021   BASOSABS 0.0 03/16/2021     Last metabolic panel Lab  Results  Component Value Date   NA 137 03/22/2021   K 4.8 03/22/2021   CL 103 03/22/2021   CO2 28 03/22/2021   BUN 28 (H) 03/22/2021   CREATININE 1.09 03/22/2021   GLUCOSE 111  (H) 03/22/2021   GFRNONAA >60 03/22/2021   CALCIUM 9.0 03/22/2021   PHOS 3.5 03/17/2021   PROT 8.3 (H) 03/16/2021   ALBUMIN 3.8 03/16/2021   BILITOT 0.4 03/16/2021   ALKPHOS 75 03/16/2021   AST 23 03/16/2021   ALT 29 03/16/2021   ANIONGAP 6 03/22/2021    CBG (last 3)  Recent Labs    03/22/21 0010 03/22/21 0419 03/22/21 0724  GLUCAP 171* 102* 115*     GFR: Estimated Creatinine Clearance: 86.9 mL/min (by C-G formula based on SCr of 1.09 mg/dL).  Coagulation Profile: No results for input(s): INR, PROTIME in the last 168 hours.  Recent Results (from the past 240 hour(s))  SARS CORONAVIRUS 2 (TAT 6-24 HRS) Nasopharyngeal Nasopharyngeal Swab     Status: None   Collection Time: 03/17/21  4:55 AM   Specimen: Nasopharyngeal Swab  Result Value Ref Range Status   SARS Coronavirus 2 NEGATIVE NEGATIVE Final    Comment: (NOTE) SARS-CoV-2 target nucleic acids are NOT DETECTED.  The SARS-CoV-2 RNA is generally detectable in upper and lower respiratory specimens during the acute phase of infection. Negative results do not preclude SARS-CoV-2 infection, do not rule out co-infections with other pathogens, and should not be used as the sole basis for treatment or other patient management decisions. Negative results must be combined with clinical observations, patient history, and epidemiological information. The expected result is Negative.  Fact Sheet for Patients: HairSlick.no  Fact Sheet for Healthcare Providers: quierodirigir.com  This test is not yet approved or cleared by the [place] FDA and  has been authorized for detection and/or diagnosis of SARS-CoV-2 by FDA under an Emergency Use Authorization (EUA). This EUA will remain  in effect (meaning this test can be used) for the duration of the COVID-19 declaration under Se ction 564(b)(1) of the Act, 21 U.S.C. section 360bbb-3(b)(1), unless the authorization is  terminated or revoked sooner.  Performed at [facility], [address] [place], [place] [address]   MRSA Next Gen by PCR, Nasal     Status: None   Collection Time: 03/20/21  3:33 PM   Specimen: Nasal Mucosa; Nasal Swab  Result Value Ref Range Status   MRSA by PCR Next Gen NOT DETECTED NOT DETECTED Final    Comment: (NOTE) The GeneXpert MRSA Assay (FDA approved for NASAL specimens only), is one component of a comprehensive MRSA colonization surveillance program. It is not intended to diagnose MRSA infection nor to guide or monitor treatment for MRSA infections. Test performance is not FDA approved in patients less than 66 years old. Performed at [facility], [address], [place], [place] [address]         Radiology Studies: No results found.      Scheduled Meds:  DULoxetine  30 mg Oral Daily   gabapentin  300 mg Oral BID   insulin aspart  0-20 Units Subcutaneous Q4H   insulin glargine  5 Units Subcutaneous BID   losartan  25 mg Oral Daily   oxyCODONE  20 mg Oral Q6H   pantoprazole  40 mg Oral Daily   senna-docusate  2 tablet Oral QHS   zinc sulfate  220 mg Oral Daily   Continuous Infusions:   ceFAZolin (ANCEF) IV  cefTRIAXone (ROCEPHIN)  IV 2 g (03/22/21 0826)   methocarbamol (ROBAXIN) IV 500 mg (03/21/21 0076)   metronidazole 500 mg (03/22/21 0523)   vancomycin 1,500 mg (03/21/21 2105)     LOS: 5 days     Jacquelin Hawking, MD [facility] 03/22/2021, 9:27 AM  If 7PM-7AM, please contact night-coverage [email or web address]

## 2021-03-22 NOTE — Interval H&P Note (Signed)
History and Physical Interval Note:  03/22/2021 11:31 AM  Brett White  has presented today for surgery, with the diagnosis of Osteomyelitis Left Foot.  The various methods of treatment have been discussed with the patient and family. After consideration of risks, benefits and other options for treatment, the patient has consented to  Procedure(s): LEFT BELOW KNEE AMPUTATION (Left) as a surgical intervention.  The patient's history has been reviewed, patient examined, no change in status, stable for surgery.  I have reviewed the patient's chart and labs.  Questions were answered to the patient's satisfaction.     [name]

## 2021-03-22 NOTE — Anesthesia Procedure Notes (Signed)
Anesthesia Regional Block: Popliteal block   Pre-Anesthetic Checklist: , timeout performed,  Correct Patient, Correct Site, Correct Laterality,  Correct Procedure,, site marked,  Risks and benefits discussed,  Surgical consent,  Pre-op evaluation,  At surgeon's request and post-op pain management  Laterality: Left  Prep: chloraprep       Needles:  Injection technique: Single-shot  Needle Type: Echogenic Stimulator Needle     Needle Length: 10cm  Needle Gauge: 21     Additional Needles:   Procedures:,,,, ultrasound used (permanent image in chart),,    Narrative:  Start time: 03/22/2021 3:30 PM End time: 03/22/2021 3:40 PM Injection made incrementally with aspirations every 5 mL.  Performed by: Personally  Anesthesiologist: Leonides Grills, MD  Additional Notes: Functioning IV was confirmed and monitors were applied.  A 20ga BBraun echogenic stimulator needle was used. Sterile prep, hand hygiene and sterile gloves were used.  Negative aspiration and negative test dose prior to incremental administration of local anesthetic. The patient tolerated the procedure well.

## 2021-03-22 NOTE — Transfer of Care (Signed)
Immediate Anesthesia Transfer of Care Note  Patient: Brett White  Procedure(s) Performed: LEFT BELOW KNEE AMPUTATION (Left: Knee)  Patient Location: PACU  Anesthesia Type:General  Level of Consciousness: awake, alert  and oriented  Airway & Oxygen Therapy: Patient Spontanous Breathing and Patient connected to face mask oxygen  Post-op Assessment: Report given to RN and Post -op Vital signs reviewed and stable  Post vital signs: Reviewed and stable  Last Vitals:  Vitals Value Taken Time  BP 168/101 03/22/21 1715  Temp    Pulse 91 03/22/21 1715  Resp 13 03/22/21 1715  SpO2 98 % 03/22/21 1715    Last Pain:  Vitals:   03/22/21 1428  TempSrc: Oral  PainSc: 10-Worst pain ever      Patients Stated Pain Goal: 2 (03/22/21 1334)  Complications: No notable events documented.

## 2021-03-22 NOTE — Anesthesia Procedure Notes (Signed)
Procedure Name: LMA Insertion Date/Time: 03/22/2021 4:32 PM Performed by: Mayer Camel, CRNA Pre-anesthesia Checklist: Patient identified, Emergency Drugs available, Suction available and Patient being monitored Patient Re-evaluated:Patient Re-evaluated prior to induction Oxygen Delivery Method: Circle System Utilized Preoxygenation: Pre-oxygenation with 100% oxygen Induction Type: IV induction LMA: LMA inserted LMA Size: 5.0 Number of attempts: 1 Airway Equipment and Method: Bite block Placement Confirmation: positive ETCO2 Tube secured with: Tape Dental Injury: Teeth and Oropharynx as per pre-operative assessment

## 2021-03-22 NOTE — Anesthesia Preprocedure Evaluation (Signed)
Anesthesia Evaluation  Patient identified by MRN, date of birth, ID band Patient awake    Reviewed: Allergy & Precautions, NPO status , Patient's Chart, lab work & pertinent test results  Airway Mallampati: III  TM Distance: >3 FB Neck ROM: Full    Dental  (+) Poor Dentition, Chipped, Missing   Pulmonary neg pulmonary ROS,    Pulmonary exam normal breath sounds clear to auscultation       Cardiovascular hypertension, Normal cardiovascular exam Rhythm:Regular Rate:Normal  ECG: SR, rate 88   Neuro/Psych  Neuromuscular disease negative psych ROS   GI/Hepatic negative GI ROS, Neg liver ROS,   Endo/Other  diabetes, Oral Hypoglycemic Agents  Renal/GU negative Renal ROS     Musculoskeletal negative musculoskeletal ROS (+)   Abdominal   Peds  Hematology  (+) anemia ,   Anesthesia Other Findings Osteomyelitis Left Foot  Reproductive/Obstetrics                             Anesthesia Physical Anesthesia Plan  ASA: 2  Anesthesia Plan: General and Regional   Post-op Pain Management: GA combined w/ Regional for post-op pain   Induction: Intravenous  PONV Risk Score and Plan: 2 and Ondansetron, Dexamethasone, Midazolam and Treatment may vary due to age or medical condition  Airway Management Planned: LMA  Additional Equipment:   Intra-op Plan:   Post-operative Plan: Extubation in OR  Informed Consent: I have reviewed the patients History and Physical, chart, labs and discussed the procedure including the risks, benefits and alternatives for the proposed anesthesia with the patient or authorized representative who has indicated his/her understanding and acceptance.     Dental advisory given  Plan Discussed with: CRNA  Anesthesia Plan Comments:         Anesthesia Quick Evaluation

## 2021-03-22 NOTE — Op Note (Signed)
   Date of Surgery: 03/22/2021  INDICATIONS: Brett White is a 60 y.o.-year-old male who osteomyelitis left foot status post foot salvage intervention surgery with first ray amputation.  PREOPERATIVE DIAGNOSIS: Osteomyelitis left foot second metatarsal.  POSTOPERATIVE DIAGNOSIS: Same.  PROCEDURE: Transtibial amputation Application of Prevena wound VAC  SURGEON: Lajoyce Corners, M.D.  ANESTHESIA:  general  IV FLUIDS AND URINE: See anesthesia records.  ESTIMATED BLOOD LOSS: See anesthesia records.  COMPLICATIONS: None.  DESCRIPTION OF PROCEDURE: The patient was brought to the operating room after undergoing regional anesthetic. After adequate levels of anesthesia were obtained patient's lower extremity was prepped using DuraPrep draped into a sterile field. A timeout was called. The foot was draped out of the sterile field with impervious stockinette. A transverse incision was made 11 cm distal to the tibial tubercle. This curved proximally and a large posterior flap was created. The tibia was transected 1 cm proximal to the skin incision. The fibula was transected just proximal to the tibial incision. The tibia was beveled anteriorly. A large posterior flap was created. The sciatic nerve was pulled cut and allowed to retract. The vascular bundles were suture ligated with 2-0 silk. The deep and superficial fascial layers were closed using #1 Vicryl. The skin was closed using staples and 2-0 nylon. The wound was covered with a Prevena customizable and arthroform wound VAC.  The dressing was sealed with dermatac there was a good suction fit. A prosthetic shrinker and limb protector were applied. Patient was taken to the PACU in stable condition.   DISCHARGE PLANNING:  Antibiotic duration: 24 hours  Weightbearing: Nonweightbearing on the operative extremity  Pain medication: Opioid pathway  Dressing care/ Wound VAC: Continue wound VAC for 1 week after discharge  Discharge to: Discharge planning  based on therapy's recommendations for possible inpatient rehabilitation, outpatient rehabilitation, or discharge to home with therapy  Follow-up: In the office 1 week post operative.  Brett Baker, MD [facility] 5:22 PM

## 2021-03-23 ENCOUNTER — Encounter (HOSPITAL_COMMUNITY): Payer: Self-pay | Admitting: Orthopedic Surgery

## 2021-03-23 LAB — GLUCOSE, CAPILLARY
Glucose-Capillary: 110 mg/dL — ABNORMAL HIGH (ref 70–99)
Glucose-Capillary: 123 mg/dL — ABNORMAL HIGH (ref 70–99)
Glucose-Capillary: 128 mg/dL — ABNORMAL HIGH (ref 70–99)
Glucose-Capillary: 138 mg/dL — ABNORMAL HIGH (ref 70–99)
Glucose-Capillary: 138 mg/dL — ABNORMAL HIGH (ref 70–99)
Glucose-Capillary: 151 mg/dL — ABNORMAL HIGH (ref 70–99)
Glucose-Capillary: 160 mg/dL — ABNORMAL HIGH (ref 70–99)
Glucose-Capillary: 183 mg/dL — ABNORMAL HIGH (ref 70–99)

## 2021-03-23 LAB — CBC
HCT: 36.7 % — ABNORMAL LOW (ref 39.0–52.0)
Hemoglobin: 11.6 g/dL — ABNORMAL LOW (ref 13.0–17.0)
MCH: 28.4 pg (ref 26.0–34.0)
MCHC: 31.6 g/dL (ref 30.0–36.0)
MCV: 89.7 fL (ref 80.0–100.0)
Platelets: 317 10*3/uL (ref 150–400)
RBC: 4.09 MIL/uL — ABNORMAL LOW (ref 4.22–5.81)
RDW: 13.4 % (ref 11.5–15.5)
WBC: 12 10*3/uL — ABNORMAL HIGH (ref 4.0–10.5)
nRBC: 0 % (ref 0.0–0.2)

## 2021-03-23 LAB — BASIC METABOLIC PANEL
Anion gap: 7 (ref 5–15)
BUN: 26 mg/dL — ABNORMAL HIGH (ref 6–20)
CO2: 28 mmol/L (ref 22–32)
Calcium: 8.7 mg/dL — ABNORMAL LOW (ref 8.9–10.3)
Chloride: 101 mmol/L (ref 98–111)
Creatinine, Ser: 1.19 mg/dL (ref 0.61–1.24)
GFR, Estimated: >60 mL/min (ref >60)
Glucose, Bld: 128 mg/dL — ABNORMAL HIGH (ref 70–99)
Potassium: 4.7 mmol/L (ref 3.5–5.1)
Sodium: 136 mmol/L (ref 135–145)

## 2021-03-23 LAB — CULTURE, BLOOD (ROUTINE X 2)
Culture: NO GROWTH
Culture: NO GROWTH
Special Requests: ADEQUATE
Special Requests: ADEQUATE

## 2021-03-23 MED ORDER — KETOROLAC TROMETHAMINE 15 MG/ML IJ SOLN
15.0000 mg | Freq: Four times a day (QID) | INTRAMUSCULAR | Status: AC
  Administered 2021-03-23 – 2021-03-24 (×5): 15 mg via INTRAVENOUS
  Filled 2021-03-23 (×5): qty 1

## 2021-03-23 MED ORDER — GABAPENTIN 300 MG PO CAPS
300.0000 mg | ORAL_CAPSULE | Freq: Three times a day (TID) | ORAL | Status: DC
  Administered 2021-03-23 – 2021-03-29 (×20): 300 mg via ORAL
  Filled 2021-03-23 (×20): qty 1

## 2021-03-23 NOTE — Progress Notes (Signed)
Occupational Therapy Treatment/Re-eval Patient Details Name: Brett White MRN: [id number] DOB: April 27, 1961 Today's Date: 03/23/2021    History of present illness Pt is 60 yo male who had L 1st ray amputation 02/04/21 and subsequent I&D 03/03/21 and presented with persistent pain in that foot with 2 episodes of N&V on 03/16/21. Pt admitted with concern for further osteomyelitis.  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  PMH: DM2, back sx, GERD, neuropathy, anxiety/ depression.   OT comments  Pt s/p L BKA since last treatment. This session pt was very limited by pain, requiring min-mod A to stand and transfers with support. Pt agitated due to having to wait for more pain meds, however still agreeable to session. Pt demonstrated low activity tolerance due to pain, only able to stand and transfer at this time, compared to earlier PT session. Acute OT will follow up to continue addressing concerns listed below.    Follow Up Recommendations  SNF    Equipment Recommendations  None recommended by OT    Recommendations for Other Services      Precautions / Restrictions Precautions Precautions: Fall Precaution Comments: NWB LLE with improved maintenance Required Braces or Orthoses: Other Brace Other Brace: Residual limb protector Restrictions Weight Bearing Restrictions: Yes LLE Weight Bearing: Non weight bearing       Mobility Bed Mobility Overal bed mobility: Needs Assistance Bed Mobility: Sit to Supine     Supine to sit: Supervision Sit to supine: Supervision   General bed mobility comments: Sup for safety with repositioning    Transfers Overall transfer level: Needs assistance Equipment used: Rolling walker (2 wheeled) Transfers: Sit to/from [employer] Sit to Stand: Min assist Stand pivot transfers: Mod assist       General transfer comment: Pt required extra support and assist due to increased pain level    Balance Overall balance assessment: Needs  assistance Sitting-balance support: Feet supported Sitting balance-Leahy Scale: Good     Standing balance support: Bilateral upper extremity supported;During functional activity Standing balance-Leahy Scale: Poor Standing balance comment: Pt bracing on RW, unable to maintain upright posture                           ADL either performed or assessed with clinical judgement   ADL Overall ADL's : Needs assistance/impaired Eating/Feeding: Independent;Sitting   Grooming: Set up;Sitting   Upper Body Bathing: Set up;Sitting   Lower Body Bathing: Moderate assistance;Sit to/from stand;Sitting/lateral leans   Upper Body Dressing : Set up;Sitting   Lower Body Dressing: Moderate assistance;Sitting/lateral leans;Sit to/from stand   Toilet Transfer: Moderate assistance;Stand-pivot   Toileting- Clothing Manipulation and Hygiene: Moderate assistance;Sitting/lateral lean;Sit to/from stand   Tub/ Engineer, structural: Moderate assistance;Stand-pivot   Functional mobility during ADLs: Moderate assistance;Rolling walker General ADL Comments: Pt mobility and LB ADL's at a mod A level at this time due to stability and pain.     Vision   Vision Assessment?: No apparent visual deficits   Perception     Praxis      Cognition Arousal/Alertness: Awake/alert Behavior During Therapy: WFL for tasks assessed/performed Overall Cognitive Status: Within Functional Limits for tasks assessed                                 General Comments: Pt focusing on pain level this session        Exercises Amputee Exercises Quad Sets: AROM;Both;5 reps;Supine  Knee Flexion: AROM;Left;5 reps;Supine (limited motion due to pain)   Shoulder Instructions       General Comments VSS on RA    Pertinent Vitals/ Pain       Pain Assessment: 0-10 Pain Score: 10-Worst pain ever Pain Location: L residual limb Pain Descriptors / Indicators:  Aching;Constant;Discomfort;Grimacing;Guarding;Jabbing Pain Intervention(s): Limited activity within patient's tolerance;Monitored during session;Repositioned;Patient requesting pain meds-RN notified  Home Living                                          Prior Functioning/Environment              Frequency  Min 2X/week        Progress Toward Goals  OT Goals(current goals can now be found in the care plan section)  Progress towards OT goals: Goals drowngraded-see care plan  Acute Rehab OT Goals Patient Stated Goal: To have less pain; get prosthesis OT Goal Formulation: With patient Time For Goal Achievement: 04/06/21 Potential to Achieve Goals: Good ADL Goals Pt Will Perform Grooming: with supervision;standing Pt Will Perform Lower Body Bathing: with supervision;sitting/lateral leans;sit to/from stand Pt Will Perform Lower Body Dressing: with supervision;sit to/from stand Pt Will Transfer to Toilet: with supervision;stand pivot transfer Pt Will Perform Toileting - Clothing Manipulation and hygiene: with supervision;sitting/lateral leans;sit to/from stand Pt Will Perform Tub/Shower Transfer: with modified independence;tub bench  Plan Discharge plan remains appropriate;Frequency remains appropriate    Co-evaluation                 AM-PAC OT "6 Clicks" Daily Activity     Outcome Measure   Help from another person eating meals?: A Little Help from another person taking care of personal grooming?: A Little Help from another person toileting, which includes using toliet, bedpan, or urinal?: A Little Help from another person bathing (including washing, rinsing, drying)?: A Little Help from another person to put on and taking off regular upper body clothing?: A Little Help from another person to put on and taking off regular lower body clothing?: A Little 6 Click Score: 18    End of Session Equipment Utilized During Treatment: Gait belt;Rolling  walker  OT Visit Diagnosis: Repeated falls (R29.6);Muscle weakness (generalized) (M62.81);Other abnormalities of gait and mobility (R26.89) Pain - Right/Left: Left Pain - part of body: Leg   Activity Tolerance Patient tolerated treatment well   Patient Left in chair;with call bell/phone within reach   Nurse Communication Mobility status        Time: 3614-4315 OT Time Calculation (min): 16 min  Charges: OT General Charges $OT Visit: 1 Visit OT Evaluation $OT Re-eval: 1 Re-eval  Brett Gauthreaux H., OTR/L Acute Rehabilitation   Brett White 03/23/2021, 5:47 PM

## 2021-03-23 NOTE — Progress Notes (Signed)
Orthopedic Tech Progress Note Patient Details:  Brett White Feb 07, 1961 [id number] Called order into [facility] Patient ID: Brett White, male   DOB: 1960/11/19, 60 y.o.   MRN: [id number]  Brett White 03/23/2021, 9:03 AM

## 2021-03-23 NOTE — Progress Notes (Signed)
Physical Therapy Treatment/Reeval Patient Details Name: Brett White MRN: [id number] DOB: [birth year] Today's Date: 03/23/2021    History of Present Illness Pt is 60 yo male who had L 1st ray amputation 02/04/21 and subsequent I&D 03/03/21 and presented with persistent pain in that foot with 2 episodes of N&V on 03/16/21. Pt admitted with concern for further osteomyelitis.  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  PMH: DM2, back sx, GERD, neuropathy, anxiety/ depression.    PT Comments    Pt s/p L BKA since last treatment.  He was able to transfers and ambulate short distance with min guard and mild unsteadiness.  Pt with good knee extension but limited flexion due to pain.  Pt had steps to enter home but a ramp is being built and has intermittent supervision at home.  Pt currently with functional limitations due to decreased mobility, strength, endurance, balance, safety, and R LE ROM. Pt will benefit from skilled PT to increase their independence and safety with mobility to allow discharge to the venue listed below.  Pt with good rehab potential.     Follow Up Recommendations  SNF     Equipment Recommendations  Wheelchair cushion (measurements PT);Wheelchair (measurements PT)    Recommendations for Other Services       Precautions / Restrictions Precautions Precautions: Fall Required Braces or Orthoses: Other Brace Other Brace: Residual limb protector Restrictions Weight Bearing Restrictions: Yes LLE Weight Bearing: Non weight bearing    Mobility  Bed Mobility Overal bed mobility: Needs Assistance Bed Mobility: Supine to Sit     Supine to sit: Supervision          Transfers Overall transfer level: Needs assistance Equipment used: Rolling walker (2 wheeled) Transfers: Sit to/from Stand Sit to Stand: Min guard         General transfer comment: vc's for hand placement  Ambulation/Gait Ambulation/Gait assistance: Min guard Gait Distance (Feet): 12 Feet Assistive device:  Rolling walker (2 wheeled)   Gait velocity: decreased   General Gait Details: hop pattern on  R LE with min guard for safety   Stairs             Wheelchair Mobility    Modified Rankin (Stroke Patients Only)       Balance Overall balance assessment: Needs assistance Sitting-balance support: Feet supported Sitting balance-Leahy Scale: Good     Standing balance support: Bilateral upper extremity supported;During functional activity Standing balance-Leahy Scale: Fair Standing balance comment: Pt balancing with RW and on R foot - steady with RW (rated as fair as pt only balancing on 1 leg)                            Cognition Arousal/Alertness: Awake/alert Behavior During Therapy: WFL for tasks assessed/performed Overall Cognitive Status: Within Functional Limits for tasks assessed                                 General Comments: Pt following commands and precautions.  DId express sadness over loss of foot.  Offered understading, encouragement, and education on prosthesis advancements and ability to regain mobility      Exercises Amputee Exercises Quad Sets: AROM;Both;5 reps;Supine Knee Flexion: AROM;Left;5 reps;Supine (limited motion due to pain) R knee flexion ~30 degrees    General Comments General comments (skin integrity, edema, etc.): VSS on RA - continuous pulse oximeter was turned off so  pt requested disconnected.  Educated pt on limb protector, resting with leg straight, and beginning quad sets and gentle ROM exercise.s      Pertinent Vitals/Pain Pain Assessment: 0-10 Pain Score: 10-Worst pain ever Pain Location: L residual limb Pain Descriptors / Indicators: Discomfort;Sharp Pain Intervention(s): Limited activity within patient's tolerance;Monitored during session;Premedicated before session;RN gave pain meds during session    Home Living                      Prior Function            PT Goals (current goals can  now be found in the care plan section) Acute Rehab PT Goals Patient Stated Goal: To have less pain; get prosthesis PT Goal Formulation: With patient Time For Goal Achievement: 04/06/21 Potential to Achieve Goals: Good Progress towards PT goals: Progressing toward goals    Frequency    Min 3X/week      PT Plan Discharge plan needs to be updated    Co-evaluation              AM-PAC PT "6 Clicks" Mobility   Outcome Measure  Help needed turning from your back to your side while in a flat bed without using bedrails?: None Help needed moving from lying on your back to sitting on the side of a flat bed without using bedrails?: A Little Help needed moving to and from a bed to a chair (including a wheelchair)?: A Little Help needed standing up from a chair using your arms (e.g., wheelchair or bedside chair)?: A Little Help needed to walk in hospital room?: A Little Help needed climbing 3-5 steps with a railing? : A Lot 6 Click Score: 18    End of Session Equipment Utilized During Treatment: Gait belt;Other (comment) (limb protector) Activity Tolerance: Patient tolerated treatment well Patient left: in chair;with call bell/phone within reach;with chair alarm set Nurse Communication: Mobility status PT Visit Diagnosis: Unsteadiness on feet (R26.81);Difficulty in walking, not elsewhere classified (R26.2);Pain;Other abnormalities of gait and mobility (R26.89) Pain - Right/Left: Left Pain - part of body: Knee     Time: 1025-8527 PT Time Calculation (min) (ACUTE ONLY): 27 min  Charges:  $Therapeutic Exercise: 8-22 mins                     Anise Salvo, PT Acute Rehab Services Pager [phone] [facility] [phone]    Rayetta Humphrey 03/23/2021, 2:22 PM

## 2021-03-23 NOTE — Progress Notes (Signed)
Patient ID: Brett White, male   DOB: 03-04-1961, 60 y.o.   MRN: [id number] Patient states he has been having increased pain since the nerve block wore off last night.  I have increased his Neurontin to 300 mg 3 times a day I have added Toradol 15 mg every 6 hours for 5 doses.  There is no drainage in the wound VAC canister with a good suction fit.

## 2021-03-23 NOTE — Progress Notes (Signed)
PROGRESS NOTE    Brett White  SPQ:[id number] DOB: [birth year] DOA: 03/16/2021 PCP: Patient, [name]   Brief Narrative: Brett White is a 60 y.o. 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  Patient resented secondary to worsening severe left foot pain with associated erythema and edema concerning for possible recurrent osteomyelitis.  Orthopedic surgery was consulted.  Patient was empirically started on vancomycin, ceftriaxone, Flagyl.  Orthopedic surgery with plan for left transmetatarsal amputation.   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 Concern for recurrent osteomyelitis. Started empirically on Vancomycin/Ceftriaxone and Flagyl. Orthopedic surgery consulted and performed left transtibial amputation on 6/22. -Continue vancomycin, Ceftriaxone, Flagyl; discontinue pending blood culture results  Post-op pain Likely will be a barrier. Orthopedic surgery managing pain regimen.  Leukocytosis Acutely elevated WBC s/p surgery. No new fevers. Blood cultures pending.  AKI Creatinine of 1.41 on admission. Resolved.  Intractable nausea/vomiting Resolved.  Diabetes mellitus, type 2 Uncontrolled. Hemoglobin A1C of 11.5% from 5/22.   Diabetic polyneuropathy -Continue gabapentin  Primary hypertension Patient is on hydrochlorothiazide as ano outpatient. Started on losartan inpatient. -Continue losartan  Anxiety Depression Continue Cymbalta  GERD -Continue Protonix  Opioid induced constipation -Continue Senkokot   DVT prophylaxis: SCDs Code Status:   Code Status: Full  Code Family Communication: None at bedside Disposition Plan: Discharge home vs SNF pending orthopedic surgery and PT/OT recommendations   Consultants:  Orthopedic surgery  Procedures:  None  Antimicrobials: Ceftriaxone IV Vancomycin IV Flagyl    Subjective: Foot pain today s/p surgery.  Objective: Vitals:   03/22/21 2122 03/23/21 0029 03/23/21 0430 03/23/21 0753  BP: 111/76 130/74 120/77 (!) 146/89  Pulse: 77 80 87 95  Resp: 17 16 16 17   Temp: 98.3 F (36.8 C) 98.2 F (36.8 C) 97.8 F (36.6 C) 98.6 F (37 C)  TempSrc: Oral Oral Oral Oral  SpO2: 97% 97% 94% 94%  Weight:      Height:        Intake/Output Summary (Last 24 hours) at 03/23/2021 1036 Last data filed at 03/23/2021 0915 Gross per 24 hour  Intake 840 ml  Output 1210 ml  Net -370 ml    Filed Weights   03/17/21 0641  Weight: 97.5 kg    Examination:  General exam: Appears calm and comfortable Respiratory system: Clear to auscultation. Respiratory effort normal. Cardiovascular system: S1 & S2 heard, RRR. No murmurs, rubs, gallops or clicks. Gastrointestinal system: Abdomen is nondistended, soft and nontender. No organomegaly or masses felt. Normal bowel sounds heard. Central nervous system: Alert and oriented. No focal neurological deficits. Musculoskeletal: No edema. No calf tenderness. Left transtibial amputation noted. Skin: No cyanosis. No rashes Psychiatry: Judgement and insight appear normal. Mood & affect appropriate.    Data Reviewed: I have personally reviewed following labs and imaging studies  CBC Lab Results  Component Value Date   WBC 12.0 (H) 03/23/2021   RBC 4.09 (L) 03/23/2021   HGB 11.6 (L) 03/23/2021   HCT 36.7 (L) 03/23/2021   MCV 89.7 03/23/2021   MCH 28.4 03/23/2021   PLT 317 03/23/2021   MCHC 31.6 03/23/2021   RDW 13.4 03/23/2021  LYMPHSABS 1.3 03/16/2021   MONOABS 0.6 03/16/2021   EOSABS 0.0 03/16/2021   BASOSABS 0.0 03/16/2021     Last metabolic panel Lab  Results  Component Value Date   NA 136 03/23/2021   K 4.7 03/23/2021   CL 101 03/23/2021   CO2 28 03/23/2021   BUN 26 (H) 03/23/2021   CREATININE 1.19 03/23/2021   GLUCOSE 128 (H) 03/23/2021   GFRNONAA >60 03/23/2021   CALCIUM 8.7 (L) 03/23/2021   PHOS 3.5 03/17/2021   PROT 8.3 (H) 03/16/2021   ALBUMIN 3.8 03/16/2021   BILITOT 0.4 03/16/2021   ALKPHOS 75 03/16/2021   AST 23 03/16/2021   ALT 29 03/16/2021   ANIONGAP 7 03/23/2021    CBG (last 3)  Recent Labs    03/23/21 0427 03/23/21 0502 03/23/21 0751  GLUCAP 128* 123* 138*      GFR: Estimated Creatinine Clearance: 79.6 mL/min (by C-G formula based on SCr of 1.19 mg/dL).  Coagulation Profile: No results for input(s): INR, PROTIME in the last 168 hours.  Recent Results (from the past 240 hour(s))  SARS CORONAVIRUS 2 (TAT 6-24 HRS) Nasopharyngeal Nasopharyngeal Swab     Status: None   Collection Time: 03/17/21  4:55 AM   Specimen: Nasopharyngeal Swab  Result Value Ref Range Status   SARS Coronavirus 2 NEGATIVE NEGATIVE Final    Comment: (NOTE) SARS-CoV-2 target nucleic acids are NOT DETECTED.  The SARS-CoV-2 RNA is generally detectable in upper and lower respiratory specimens during the acute phase of infection. Negative results do not preclude SARS-CoV-2 infection, do not rule out co-infections with other pathogens, and should not be used as the sole basis for treatment or other patient management decisions. Negative results must be combined with clinical observations, patient history, and epidemiological information. The expected result is Negative.  Fact Sheet for Patients: HairSlick.no  Fact Sheet for Healthcare Providers: quierodirigir.com  This test is not yet approved or cleared by the [place] FDA and  has been authorized for detection and/or diagnosis of SARS-CoV-2 by FDA under an Emergency Use Authorization (EUA). This EUA will remain  in  effect (meaning this test can be used) for the duration of the COVID-19 declaration under Se ction 564(b)(1) of the Act, 21 U.S.C. section 360bbb-3(b)(1), unless the authorization is terminated or revoked sooner.  Performed at [facility], [address], [place], [place] [address]   MRSA Next Gen by PCR, Nasal     Status: None   Collection Time: 03/20/21  3:33 PM   Specimen: Nasal Mucosa; Nasal Swab  Result Value Ref Range Status   MRSA by PCR Next Gen NOT DETECTED NOT DETECTED Final    Comment: (NOTE) The GeneXpert MRSA Assay (FDA approved for NASAL specimens only), is one component of a comprehensive MRSA colonization surveillance program. It is not intended to diagnose MRSA infection nor to guide or monitor treatment for MRSA infections. Test performance is not FDA approved in patients less than 74 years old. Performed at [facility], [address], [place], [place] [address]          Radiology Studies: No results found.      Scheduled Meds:  vitamin C  1,000 mg Oral Daily   docusate sodium  100 mg Oral Daily   DULoxetine  30 mg Oral Daily   gabapentin  300 mg Oral TID   insulin aspart  0-20 Units Subcutaneous Q4H   insulin glargine  5 Units Subcutaneous BID   ketorolac  15 mg Intravenous Q6H   losartan  25 mg Oral Daily   nutrition supplement (JUVEN)  1 packet Oral BID BM   oxyCODONE  20 mg Oral Q6H   pantoprazole  40 mg Oral Daily   senna-docusate  2 tablet Oral QHS   zinc sulfate  220 mg Oral Daily   Continuous Infusions:   ceFAZolin (ANCEF) IV 2 g (03/23/21 0059)   magnesium sulfate bolus IVPB     methocarbamol (ROBAXIN) IV 500 mg (03/23/21 0834)   metronidazole 500 mg (03/23/21 9518)   vancomycin 1,500 mg (03/22/21 2233)     LOS: 6 days     Jacquelin Hawking, MD [facility] 03/23/2021, 10:36 AM  If 7PM-7AM, please contact night-coverage [email or web address]

## 2021-03-24 LAB — GLUCOSE, CAPILLARY
Glucose-Capillary: 104 mg/dL — ABNORMAL HIGH (ref 70–99)
Glucose-Capillary: 117 mg/dL — ABNORMAL HIGH (ref 70–99)
Glucose-Capillary: 157 mg/dL — ABNORMAL HIGH (ref 70–99)
Glucose-Capillary: 157 mg/dL — ABNORMAL HIGH (ref 70–99)
Glucose-Capillary: 170 mg/dL — ABNORMAL HIGH (ref 70–99)
Glucose-Capillary: 189 mg/dL — ABNORMAL HIGH (ref 70–99)
Glucose-Capillary: 231 mg/dL — ABNORMAL HIGH (ref 70–99)

## 2021-03-24 LAB — BASIC METABOLIC PANEL
Anion gap: 8 (ref 5–15)
BUN: 32 mg/dL — ABNORMAL HIGH (ref 6–20)
CO2: 25 mmol/L (ref 22–32)
Calcium: 8.5 mg/dL — ABNORMAL LOW (ref 8.9–10.3)
Chloride: 101 mmol/L (ref 98–111)
Creatinine, Ser: 1.09 mg/dL (ref 0.61–1.24)
GFR, Estimated: >60 mL/min (ref >60)
Glucose, Bld: 156 mg/dL — ABNORMAL HIGH (ref 70–99)
Potassium: 4.2 mmol/L (ref 3.5–5.1)
Sodium: 134 mmol/L — ABNORMAL LOW (ref 135–145)

## 2021-03-24 LAB — CBC
HCT: 32.5 % — ABNORMAL LOW (ref 39.0–52.0)
Hemoglobin: 10.6 g/dL — ABNORMAL LOW (ref 13.0–17.0)
MCH: 28.7 pg (ref 26.0–34.0)
MCHC: 32.6 g/dL (ref 30.0–36.0)
MCV: 88.1 fL (ref 80.0–100.0)
Platelets: 252 10*3/uL (ref 150–400)
RBC: 3.69 MIL/uL — ABNORMAL LOW (ref 4.22–5.81)
RDW: 13.3 % (ref 11.5–15.5)
WBC: 9.8 10*3/uL (ref 4.0–10.5)
nRBC: 0 % (ref 0.0–0.2)

## 2021-03-24 LAB — SURGICAL PATHOLOGY

## 2021-03-24 NOTE — Progress Notes (Signed)
PROGRESS NOTE    Brett White  FIE:[id number] DOB: 02-26-1961 DOA: 03/16/2021 PCP: Patient, [name]   Brief Narrative: Brett White is a 60 y.o. 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  Patient resented secondary to worsening severe left foot pain with associated erythema and edema concerning for possible recurrent osteomyelitis.  Orthopedic surgery was consulted.  Patient was empirically started on vancomycin, ceftriaxone, Flagyl.  Orthopedic surgery with plan for left transmetatarsal amputation.   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 Concern for recurrent osteomyelitis. Started empirically on Vancomycin/Ceftriaxone and Flagyl. Orthopedic surgery consulted and performed left transtibial amputation on 6/22. Blood cultures with no growth. -Discontinue vancomycin, Ceftriaxone, Flagyl  Post-op pain Likely will be a barrier to discharge. Orthopedic surgery managing pain regimen.  Leukocytosis Acutely elevated WBC s/p surgery. No new fevers. Blood cultures with no growth. Resolved.  AKI Creatinine of 1.41 on admission. Resolved.  Intractable nausea/vomiting Resolved.  Diabetes mellitus, type 2 Uncontrolled. Hemoglobin A1C of 11.5% from 5/22.   Diabetic polyneuropathy -Continue gabapentin  Primary hypertension Patient is on hydrochlorothiazide as ano outpatient. Started on losartan inpatient. -Continue losartan  Anxiety Depression Continue Cymbalta  GERD -Continue Protonix  Opioid induced constipation -Continue Senkokot   DVT prophylaxis: SCDs Code Status:   Code  Status: Full Code Family Communication: None at bedside Disposition Plan: Discharge to SNF when bed is available. Medically stable for discharge pending final pain management recommendations by ortho   Consultants:  Orthopedic surgery  Procedures:  None  Antimicrobials: Ceftriaxone IV Vancomycin IV Flagyl    Subjective: Continued pain. No other concerns.  Objective: Vitals:   03/23/21 0430 03/23/21 0753 03/23/21 1142 03/24/21 0808  BP: 120/77 (!) 146/89 (!) 142/88 (!) 148/86  Pulse: 87 95 100 85  Resp: 16 17 18 17   Temp: 97.8 F (36.6 C) 98.6 F (37 C) 99.8 F (37.7 C) 97.9 F (36.6 C)  TempSrc: Oral Oral Oral   SpO2: 94% 94% 94% 95%  Weight:      Height:        Intake/Output Summary (Last 24 hours) at 03/24/2021 1037 Last data filed at 03/23/2021 1945 Gross per 24 hour  Intake 1395.07 ml  Output 0 ml  Net 1395.07 ml    Filed Weights   03/17/21 0641  Weight: 97.5 kg    Examination:  General exam: Appears calm and comfortable Respiratory system: Clear to auscultation. Respiratory effort normal. Cardiovascular system: S1 & S2 heard, RRR. No murmurs, rubs, gallops or clicks. Gastrointestinal system: Abdomen is nondistended, soft and nontender. No organomegaly or masses felt. Normal bowel sounds heard. Central nervous system: Alert and oriented. No focal neurological deficits. Musculoskeletal: No edema. No calf tenderness. Left BKA Skin: No cyanosis. No rashes Psychiatry: Judgement and insight appear normal. Mood & affect appropriate.    Data Reviewed: I have personally reviewed following labs and imaging studies  CBC Lab Results  Component Value Date   WBC 9.8 03/24/2021   RBC 3.69 (L) 03/24/2021   HGB 10.6 (L) 03/24/2021   HCT 32.5 (L) 03/24/2021   MCV 88.1 03/24/2021   MCH 28.7 03/24/2021   PLT 252  03/24/2021   MCHC 32.6 03/24/2021   RDW 13.3 03/24/2021   LYMPHSABS 1.3 03/16/2021   MONOABS 0.6 03/16/2021   EOSABS 0.0 03/16/2021   BASOSABS  0.0 03/16/2021     Last metabolic panel Lab Results  Component Value Date   NA 134 (L) 03/24/2021   K 4.2 03/24/2021   CL 101 03/24/2021   CO2 25 03/24/2021   BUN 32 (H) 03/24/2021   CREATININE 1.09 03/24/2021   GLUCOSE 156 (H) 03/24/2021   GFRNONAA >60 03/24/2021   CALCIUM 8.5 (L) 03/24/2021   PHOS 3.5 03/17/2021   PROT 8.3 (H) 03/16/2021   ALBUMIN 3.8 03/16/2021   BILITOT 0.4 03/16/2021   ALKPHOS 75 03/16/2021   AST 23 03/16/2021   ALT 29 03/16/2021   ANIONGAP 8 03/24/2021    CBG (last 3)  Recent Labs    03/24/21 0121 03/24/21 0448 03/24/21 0807  GLUCAP 157* 104* 117*      GFR: Estimated Creatinine Clearance: 86.9 mL/min (by C-G formula based on SCr of 1.09 mg/dL).  Coagulation Profile: No results for input(s): INR, PROTIME in the last 168 hours.  Recent Results (from the past 240 hour(s))  Blood culture (routine x 2)     Status: None   Collection Time: 03/17/21 12:03 AM   Specimen: BLOOD  Result Value Ref Range Status   Specimen Description BLOOD LEFT ARM  Final   Special Requests   Final    BOTTLES DRAWN AEROBIC AND ANAEROBIC Blood Culture adequate volume   Culture   Final    NO GROWTH 5 DAYS Performed at [facility], [address] [place], [place] [address]    Report Status 03/23/2021 FINAL  Final  Blood culture (routine x 2)     Status: None   Collection Time: 03/17/21 12:10 AM   Specimen: BLOOD  Result Value Ref Range Status   Specimen Description BLOOD RIGHT ARM  Final   Special Requests   Final    BOTTLES DRAWN AEROBIC ONLY Blood Culture adequate volume   Culture   Final    NO GROWTH 5 DAYS Performed at [facility], [address] [place], [place] [address]    Report Status 03/23/2021 FINAL  Final  SARS CORONAVIRUS 2 (TAT 6-24 HRS) Nasopharyngeal Nasopharyngeal Swab     Status: None   Collection Time: 03/17/21  4:55 AM   Specimen: Nasopharyngeal Swab  Result Value Ref Range Status   SARS Coronavirus 2 NEGATIVE NEGATIVE  Final    Comment: (NOTE) SARS-CoV-2 target nucleic acids are NOT DETECTED.  The SARS-CoV-2 RNA is generally detectable in upper and lower respiratory specimens during the acute phase of infection. Negative results do not preclude SARS-CoV-2 infection, do not rule out co-infections with other pathogens, and should not be used as the sole basis for treatment or other patient management decisions. Negative results must be combined with clinical observations, patient history, and epidemiological information. The expected result is Negative.  Fact Sheet for Patients: HairSlick.no  Fact Sheet for Healthcare Providers: quierodirigir.com  This test is not yet approved or cleared by the [place] FDA and  has been authorized for detection and/or diagnosis of SARS-CoV-2 by FDA under an Emergency Use Authorization (EUA). This EUA will remain  in effect (meaning this test can be used) for the duration of the COVID-19 declaration under Se ction 564(b)(1) of the Act, 21 U.S.C. section 360bbb-3(b)(1), unless the authorization is terminated or revoked sooner.  Performed at [facility], [address] [place], [place] [address]   MRSA Next Gen by PCR, Nasal     Status: None   Collection Time: 03/20/21  3:33 PM   Specimen: Nasal Mucosa; Nasal Swab  Result Value Ref Range Status   MRSA by PCR Next Gen NOT DETECTED NOT DETECTED Final    Comment: (NOTE) The GeneXpert MRSA Assay (FDA approved for NASAL specimens only), is one component of a comprehensive MRSA colonization surveillance program. It is not intended to diagnose MRSA infection nor to guide or monitor treatment for MRSA infections. Test performance is not FDA approved in patients less than 61 years old. Performed at [facility], [address] [place], [place] [address]          Radiology Studies: No results found.      Scheduled Meds:  vitamin C   1,000 mg Oral Daily   docusate sodium  100 mg Oral Daily   DULoxetine  30 mg Oral Daily   gabapentin  300 mg Oral TID   insulin aspart  0-20 Units Subcutaneous Q4H   insulin glargine  5 Units Subcutaneous BID   ketorolac  15 mg Intravenous Q6H   losartan  25 mg Oral Daily   nutrition supplement (JUVEN)  1 packet Oral BID BM   oxyCODONE  20 mg Oral Q6H   pantoprazole  40 mg Oral Daily   senna-docusate  2 tablet Oral QHS   zinc sulfate  220 mg Oral Daily   Continuous Infusions:  magnesium sulfate bolus IVPB     methocarbamol (ROBAXIN) IV 500 mg (03/24/21 0051)     LOS: 7 days     Jacquelin Hawking, MD [facility] 03/24/2021, 10:37 AM  If 7PM-7AM, please contact night-coverage [email or web address]

## 2021-03-24 NOTE — TOC Initial Note (Addendum)
Transition of Care [facility]) - Initial/Assessment Note    Patient Details  Name: Brett White MRN: [id number] Date of Birth: 22-Jul-1961  Transition of Care [facility]) CM/SW Contact:    Epifanio Lesches, RN Phone Number: 03/24/2021, 4:17 PM  Clinical Narrative:      Readmitted with concern for L foot osteomyelitis. 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 Pt from home with friend. States recently d/c from SNF/ [facility] ( setup with [facility], PT/OT), was home x 2 days and represented back to the hospital.            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 RNCM spoke with patient regarding PT recommendation of SNF placement at time of discharge. Patient reported that he  is currently unable to care for self  at home independently given current physical needs and fall risk. Patient expressed understanding of PT recommendation and is agreeable to SNF placement at time of discharge. Patient reports preference for [facility] . RNCM discussed insurance authorization process and provided Medicare SNF ratings list. Patient expressed being hopeful for rehab and to feel better soon. No further questions reported at this time. RNCM to continue to follow and assist with discharge planning needs.   Expected Discharge Plan: Skilled Nursing Facility Barriers to Discharge: Insurance Authorization, No SNF bed   Patient Goals and CMS Choice   CMS Medicare.gov Compare Post Acute Care list provided to:: Patient    Expected Discharge Plan and Services Expected Discharge Plan: Skilled Nursing Facility   Discharge Planning Services: CM Consult                                          Prior Living Arrangements/Services   Lives with:: Other (Comment) (Friend's sister) Patient language and need for interpreter reviewed:: Yes Do you feel safe going back to the place where you live?: Yes      Need for Family Participation in  Patient Care: Yes (Comment) Care giver support system in place?: No (comment)   Criminal Activity/Legal Involvement Pertinent to Current Situation/Hospitalization: No - Comment as needed  Activities of Daily Living Home Assistive Devices/Equipment: CBG Meter ADL Screening (condition at time of admission) Patient's cognitive ability adequate to safely complete daily activities?: Yes Is the patient deaf or have difficulty hearing?: No Does the patient have difficulty seeing, even when wearing glasses/contacts?: No Does the patient have difficulty concentrating, remembering, or making decisions?: No Patient able to express need for assistance with ADLs?: No Does the patient have difficulty dressing or bathing?: No Independently performs ADLs?: Yes (appropriate for developmental age) Does the patient have difficulty walking or climbing stairs?: No Weakness of Legs: Right Weakness of Arms/Hands: None  Permission Sought/Granted                  Emotional Assessment Appearance:: Appears stated age Attitude/Demeanor/Rapport: Engaged Affect (typically observed): Accepting Orientation: : Oriented to Self, Oriented to Place, Oriented to  Time, Oriented to Situation Alcohol / Substance Use: Not Applicable Psych Involvement: No (comment)  Admission diagnosis:  Left foot infection [L08.9] Patient Active Problem List   Diagnosis Date Noted   Acute osteomyelitis of left foot (HCC) 03/19/2021   Left foot infection 03/17/2021   Pain in left ankle and joints of left foot    Abscess of left foot  03/03/2021   Cutaneous abscess of left foot    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   Uncontrolled type 2 diabetes mellitus with hyperglycemia, without long-term current use of insulin (HCC) 02/03/2021   Essential hypertension 02/03/2021   Lactic acidosis 02/03/2021   Diabetic peripheral neuropathy associated with type 2 diabetes mellitus (HCC) 02/03/2021   Osteomyelitis (HCC)  02/03/2021   Cellulitis of toe of left foot    PCP:  Patient, [name] Pharmacy:   [id number] [place], [place] - [address] AT [facility] [address] [place] [address] Phone: [phone] Fax: [phone]     Social Determinants of Health (SDOH) Interventions    Readmission Risk Interventions No flowsheet data found.

## 2021-03-24 NOTE — Progress Notes (Signed)
Physical Therapy Treatment Patient Details Name: Brett White MRN: [id number] DOB: 1960/12/05 Today's Date: 03/24/2021    History of Present Illness Pt is 60 yo male who had L 1st ray amputation 02/04/21 and subsequent I&D 03/03/21 and presented with persistent pain in that foot with 2 episodes of N&V on 03/16/21. Pt admitted with concern for further osteomyelitis.  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  PMH: DM2, back sx, GERD, neuropathy, anxiety/ depression.    PT Comments    Pt supine in bed on arrival this session.  He was pleasant and agreeable to PT session.  NOted fatigue with increase in gt distance with LOB posterior.  Continue to recommend snf.  He reports today," I don't know how I am going to manage this on the streets."  Not sure how long he will be able to stay with a friend after snf placement.     Follow Up Recommendations  SNF     Equipment Recommendations  Wheelchair cushion (measurements PT);Wheelchair (measurements PT)    Recommendations for Other Services       Precautions / Restrictions Precautions Precautions: Fall Precaution Comments: NWB LLE with improved maintenance Required Braces or Orthoses: Other Brace Other Brace: Residual limb protector Restrictions Weight Bearing Restrictions: Yes LLE Weight Bearing: Non weight bearing    Mobility  Bed Mobility Overal bed mobility: Needs Assistance Bed Mobility: Supine to Sit;Sit to Supine     Supine to sit: Supervision Sit to supine: Supervision        Transfers Overall transfer level: Needs assistance Equipment used: Rolling walker (2 wheeled) Transfers: Sit to/from Stand Sit to Stand: Min assist         General transfer comment: Pt required extra support and assist to stand.  Cues for hand placement and bed height elevated.  Ambulation/Gait Ambulation/Gait assistance: Min guard;Mod assist Gait Distance (Feet): 60 Feet Assistive device: Rolling walker (2 wheeled) Gait Pattern/deviations: Step-to  pattern;Decreased step length - right;Decreased step length - left;Decreased weight shift to left Gait velocity: decreased   General Gait Details: hop pattern on  R LE with min guard for safety.  Noted buckling x 1 this session and posterior LOB with mod assistance to correct.   Stairs             Wheelchair Mobility    Modified Rankin (Stroke Patients Only)       Balance Overall balance assessment: Needs assistance Sitting-balance support: Feet supported Sitting balance-Leahy Scale: Good       Standing balance-Leahy Scale: Poor                              Cognition Arousal/Alertness: Awake/alert Behavior During Therapy: WFL for tasks assessed/performed Overall Cognitive Status: Within Functional Limits for tasks assessed                                        Exercises      General Comments        Pertinent Vitals/Pain Pain Assessment: Faces Faces Pain Scale: Hurts little more Pain Location: L residual limb Pain Descriptors / Indicators: Aching;Constant;Discomfort;Grimacing;Guarding;Jabbing Pain Intervention(s): Monitored during session    Home Living                      Prior Function  PT Goals (current goals can now be found in the care plan section) Acute Rehab PT Goals Patient Stated Goal: To have less pain; get prosthesis Potential to Achieve Goals: Good Progress towards PT goals: Progressing toward goals    Frequency    Min 3X/week      PT Plan Current plan remains appropriate    Co-evaluation              AM-PAC PT "6 Clicks" Mobility   Outcome Measure  Help needed turning from your back to your side while in a flat bed without using bedrails?: None Help needed moving from lying on your back to sitting on the side of a flat bed without using bedrails?: A Little Help needed moving to and from a bed to a chair (including a wheelchair)?: A Little Help needed standing up from a  chair using your arms (e.g., wheelchair or bedside chair)?: A Little Help needed to walk in hospital room?: A Little Help needed climbing 3-5 steps with a railing? : A Lot 6 Click Score: 18    End of Session Equipment Utilized During Treatment: Gait belt (limb guard) Activity Tolerance: Patient tolerated treatment well Patient left: in chair;with call bell/phone within reach;with chair alarm set Nurse Communication: Mobility status PT Visit Diagnosis: Unsteadiness on feet (R26.81);Difficulty in walking, not elsewhere classified (R26.2);Pain;Other abnormalities of gait and mobility (R26.89) Pain - Right/Left: Left Pain - part of body: Knee     Time: 6283-6629 PT Time Calculation (min) (ACUTE ONLY): 20 min  Charges:  $Gait Training: 8-22 mins                     Bonney Leitz , PTA Acute Rehabilitation Services Pager [phone] Office [phone]    Maimuna Leaman Artis Delay 03/24/2021, 6:06 PM

## 2021-03-24 NOTE — Progress Notes (Signed)
Patient is postop day 2 status post below-knee amputation complains of pain but appears comfortable. Vital signs stable wound VAC is functioning with 0 cc in the canister   Plan for discharge to skilled nursing continue PT for now

## 2021-03-24 NOTE — NC FL2 (Signed)
[place] MEDICAID FL2 LEVEL OF CARE SCREENING TOOL     IDENTIFICATION  Patient Name: Brett White Birthdate: July 23, 1961 Sex: male Admission Date (Current Location): 03/16/2021  [facility] and [place] Number:  Producer, television/film/video and Address:  [facility] [place], [address] [place], [place] [address]      Provider Number: [id number]  Attending Physician Name and Address:  Narda Bonds, MD  Relative Name and Phone Number:       Current Level of Care: Hospital Recommended Level of Care: Skilled Nursing Facility Prior Approval Number:    Date Approved/Denied:   PASRR Number:    Discharge Plan:      Current Diagnoses: Patient Active Problem List   Diagnosis Date Noted   Acute osteomyelitis of left foot (HCC) 03/19/2021   Left foot infection 03/17/2021   Pain in left ankle and joints of left foot    Abscess of left foot 03/03/2021   Cutaneous abscess of left foot    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   Uncontrolled type 2 diabetes mellitus with hyperglycemia, without long-term current use of insulin (HCC) 02/03/2021   Essential hypertension 02/03/2021   Lactic acidosis 02/03/2021   Diabetic peripheral neuropathy associated with type 2 diabetes mellitus (HCC) 02/03/2021   Osteomyelitis (HCC) 02/03/2021   Cellulitis of toe of left foot     Orientation RESPIRATION BLADDER Height & Weight     Self, Time, Situation  Normal Continent Weight: 97.5 kg Height:  6\' 2"  (188 cm)  BEHAVIORAL SYMPTOMS/MOOD NEUROLOGICAL BOWEL NUTRITION STATUS      Continent Diet (refer to d/c summary)  AMBULATORY STATUS COMMUNICATION OF NEEDS Skin   Extensive Assist Verbally Surgical wounds (s/pTranstibial amputation  Application of Prevena wound VAC, 6/22)                       Personal Care Assistance Level of Assistance  Bathing, Feeding, Dressing Bathing Assistance: Maximum assistance Feeding assistance: Independent Dressing Assistance:  Maximum assistance     Functional Limitations Info  Sight, Hearing, Speech Sight Info: Adequate Hearing Info: Adequate Speech Info: Adequate    SPECIAL CARE FACTORS FREQUENCY  PT (By licensed PT), OT (By licensed OT)     PT Frequency: 5x/ week, evaluate and treat OT Frequency: 5x/ week, evaluate and treat            Contractures Contractures Info: Not present    Additional Factors Info  Code Status, Allergies Code Status Info: full code Allergies Info: No known allergies           Current Medications (03/24/2021):  This is the current hospital active medication list Current Facility-Administered Medications  Medication Dose Route Frequency Provider Last Rate Last Admin   acetaminophen (TYLENOL) tablet 325-650 mg  325-650 mg Oral Q6H PRN 03/26/2021, MD   650 mg at 03/22/21 2231   alum & mag hydroxide-simeth (MAALOX/MYLANTA) 200-200-20 MG/5ML suspension 15-30 mL  15-30 mL Oral Q2H PRN 01-22-1975, MD       ascorbic acid (VITAMIN C) tablet 1,000 mg  1,000 mg Oral Daily [name], MD   1,000 mg at 03/24/21 0945   bisacodyl (DULCOLAX) EC tablet 5 mg  5 mg Oral Daily PRN 03/26/21, MD       docusate sodium (COLACE) capsule 100 mg  100 mg Oral Daily [name], MD   100 mg at 03/24/21 0945   DULoxetine (CYMBALTA)  DR capsule 30 mg  30 mg Oral Daily [name], MD   30 mg at 03/24/21 0945   gabapentin (NEURONTIN) capsule 300 mg  300 mg Oral TID [name], MD   300 mg at 03/24/21 1630   guaiFENesin-dextromethorphan (ROBITUSSIN DM) 100-10 MG/5ML syrup 15 mL  15 mL Oral Q4H PRN [name], MD       hydrALAZINE (APRESOLINE) injection 5 mg  5 mg Intravenous Q20 Min PRN [name], MD       hydrALAZINE (APRESOLINE) tablet 25 mg  25 mg Oral Q8H PRN [name], MD       HYDROmorphone (DILAUDID) injection 0.5-1 mg  0.5-1 mg Intravenous Q4H PRN [name], MD       HYDROmorphone (DILAUDID) injection 1 mg  1 mg Intravenous Q3H PRN [name],  MD   1 mg at 03/24/21 1630   insulin aspart (novoLOG) injection 0-20 Units  0-20 Units Subcutaneous Q4H [name], MD   4 Units at 03/24/21 1634   insulin glargine (LANTUS) injection 5 Units  5 Units Subcutaneous BID [name], MD   5 Units at 03/24/21 0947   labetalol (NORMODYNE) injection 10 mg  10 mg Intravenous Q10 min PRN [name], MD       losartan (COZAAR) tablet 25 mg  25 mg Oral Daily [name], MD   25 mg at 03/24/21 0947   magnesium citrate solution 1 Bottle  1 Bottle Oral Once PRN [name], MD       magnesium sulfate IVPB 2 g 50 mL  2 g Intravenous Daily PRN [name], MD       methocarbamol (ROBAXIN) 500 mg in dextrose 5 % 50 mL IVPB  500 mg Intravenous Q6H PRN [name], MD 100 mL/hr at 03/24/21 0051 500 mg at 03/24/21 0051   metoprolol tartrate (LOPRESSOR) injection 2-5 mg  2-5 mg Intravenous Q2H PRN [name], MD       nutrition supplement (JUVEN) (JUVEN) powder packet 1 packet  1 packet Oral BID BM [name], MD   1 packet at 03/24/21 0944   ondansetron (ZOFRAN) injection 4 mg  4 mg Intravenous Q6H PRN [name], MD       oxyCODONE (Oxy IR/ROXICODONE) immediate release tablet 10-15 mg  10-15 mg Oral Q4H PRN [name], MD   15 mg at 03/23/21 1752   oxyCODONE (Oxy IR/ROXICODONE) immediate release tablet 20 mg  20 mg Oral Q6H [name], MD   20 mg at 03/24/21 1235   oxyCODONE (Oxy IR/ROXICODONE) immediate release tablet 5-10 mg  5-10 mg Oral Q4H PRN [name], MD       pantoprazole (PROTONIX) EC tablet 40 mg  40 mg Oral Daily [name], MD   40 mg at 03/24/21 0945   phenol (CHLORASEPTIC) mouth spray 1 spray  1 spray Mouth/Throat PRN [name], MD       polyethylene glycol (MIRALAX / GLYCOLAX) packet 17 g  17 g Oral Daily PRN [name], MD       potassium chloride SA (KLOR-CON) CR tablet 20-40 mEq  20-40 mEq Oral Daily PRN [name], MD       prochlorperazine (COMPAZINE) injection 5 mg  5 mg  Intravenous Q6H PRN [name], MD   5 mg at 03/21/21 1232   senna-docusate (Senokot-S) tablet 2 tablet  2  tablet Oral QHS [name], MD   2 tablet at 03/23/21 2253   zinc sulfate capsule 220 mg  220 mg Oral Daily [name], MD   220 mg at 03/24/21 9518     Discharge Medications: Please see discharge summary for a list of discharge medications.  Relevant Imaging Results:  Relevant Lab Results:   Additional Information SS# [id number] [place], [place]

## 2021-03-24 NOTE — Plan of Care (Signed)
  Problem: Clinical Measurements: Goal: Ability to maintain clinical measurements within normal limits will improve Outcome: Progressing   Problem: Activity: Goal: Risk for activity intolerance will decrease Outcome: Progressing   Problem: Nutrition: Goal: Adequate nutrition will be maintained Outcome: Progressing   Problem: Elimination: Goal: Will not experience complications related to bowel motility Outcome: Progressing   Problem: Pain Managment: Goal: General experience of comfort will improve Outcome: Progressing   Problem: Safety: Goal: Ability to remain free from injury will improve Outcome: Progressing   

## 2021-03-25 LAB — GLUCOSE, CAPILLARY
Glucose-Capillary: 133 mg/dL — ABNORMAL HIGH (ref 70–99)
Glucose-Capillary: 157 mg/dL — ABNORMAL HIGH (ref 70–99)
Glucose-Capillary: 159 mg/dL — ABNORMAL HIGH (ref 70–99)
Glucose-Capillary: 167 mg/dL — ABNORMAL HIGH (ref 70–99)
Glucose-Capillary: 193 mg/dL — ABNORMAL HIGH (ref 70–99)
Glucose-Capillary: 90 mg/dL (ref 70–99)

## 2021-03-25 MED ORDER — DIPHENHYDRAMINE HCL 25 MG PO CAPS
25.0000 mg | ORAL_CAPSULE | Freq: Once | ORAL | Status: AC
  Administered 2021-03-25: 25 mg via ORAL
  Filled 2021-03-25: qty 1

## 2021-03-25 NOTE — Progress Notes (Signed)
Patient ID: Brett White, male   DOB: [birth year], 60 y.o.   MRN: [id number] Patient is postoperative day 3 transtibial amputation on the left.  Patient states he was in the shower standing next to the sink when his leg gave out and he fell on the residual limb.  Patient was not wearing his limb protector.  I applied this while patient was in bed.  Patient needs to be wearing the limb protector when he is up at all times.

## 2021-03-25 NOTE — Progress Notes (Addendum)
At bed time CNA was helping patient for bathroom and bath. According to patient after bath he stand up with wet foot then slipped in sitting position but did not hit his head then CNA helped him to get up. Full body assessment done, no injury noted at this time.VS and neuro WNL,will continue to monitor. This nurse suggested CNA to call for help and not to leave patient by himself during bath and ADLS, she verbalized understanding. Safety education provided to patient, he verbalized understanding.

## 2021-03-25 NOTE — Progress Notes (Signed)
PROGRESS NOTE    Brett White  ZOX:[id number] DOB: 1961-02-23 DOA: 03/16/2021 PCP: Patient, [name]   Brief Narrative: Brett White is a 60 y.o. 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  Patient resented secondary to worsening severe left foot pain with associated erythema and edema concerning for possible recurrent osteomyelitis.  Orthopedic surgery was consulted.  Patient was empirically started on vancomycin, ceftriaxone, Flagyl.  Orthopedic surgery with plan for left transmetatarsal amputation.   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 Concern for recurrent osteomyelitis. Started empirically on Vancomycin/Ceftriaxone and Flagyl. Orthopedic surgery consulted and performed left transtibial amputation on 6/22. Blood cultures with no growth. Discontinued vancomycin, Ceftriaxone, Flagyl. PT/OT recommending SNF.  Post-op pain Likely will be a barrier to discharge. Orthopedic surgery managing pain regimen. Needs to transition of IV narcotics.  Leukocytosis Acutely elevated WBC s/p surgery. No new fevers. Blood cultures with no growth. Resolved.  AKI Creatinine of 1.41 on admission. Resolved.  Intractable nausea/vomiting Resolved.  Diabetes mellitus, type 2 Uncontrolled. Hemoglobin A1C of 11.5% from 5/22.   Diabetic polyneuropathy -Continue gabapentin  Primary hypertension Patient is on hydrochlorothiazide as ano outpatient. Started on losartan inpatient. -Continue losartan  Anxiety Depression Continue Cymbalta  GERD -Continue Protonix  Opioid induced constipation -Continue  Senokot   DVT prophylaxis: SCDs Code Status:   Code Status: Full Code Family Communication: None at bedside Disposition Plan: Discharge to SNF when bed is available. Medically stable for discharge pending final pain management recommendations by ortho   Consultants:  Orthopedic surgery  Procedures:  None  Antimicrobials: Ceftriaxone IV Vancomycin IV Flagyl    Subjective: Patient was in the bathroom where he slid and landed on his stump while trying to bathe.  Objective: Vitals:   03/24/21 1629 03/24/21 1900 03/24/21 2030 03/25/21 0758  BP: (!) 156/91 (!) 148/82 (!) 150/86 (!) 154/93  Pulse: 79 84 80 90  Resp: 17 17 18 16   Temp: 98.2 F (36.8 C) 98.6 F (37 C) 98 F (36.7 C) 98.8 F (37.1 C)  TempSrc: Oral Oral Oral Oral  SpO2: 98% 98% 98% 98%  Weight:      Height:        Intake/Output Summary (Last 24 hours) at 03/25/2021 1025 Last data filed at 03/25/2021 0900 Gross per 24 hour  Intake 660 ml  Output 1450 ml  Net -790 ml    Filed Weights   03/17/21 0641  Weight: 97.5 kg    Examination:  General exam: Appears calm and comfortable Respiratory system: Clear to auscultation. Respiratory effort normal. Cardiovascular system: S1 & S2 heard, RRR. No murmurs, rubs, gallops or clicks. Gastrointestinal system: Abdomen is nondistended, soft and nontender. No organomegaly or masses felt. Normal bowel sounds heard. Central nervous system: Alert and oriented. No focal neurological deficits. Musculoskeletal: No edema. Left BKA Skin: No cyanosis. No rashes Psychiatry: Judgement and insight appear normal. Mood & affect appropriate.    Data Reviewed: I have personally reviewed following labs and imaging studies  CBC Lab Results  Component Value Date   WBC 9.8 03/24/2021   RBC 3.69 (L) 03/24/2021   HGB 10.6 (L) 03/24/2021  HCT 32.5 (L) 03/24/2021   MCV 88.1 03/24/2021   MCH 28.7 03/24/2021   PLT 252 03/24/2021   MCHC 32.6 03/24/2021   RDW 13.3 03/24/2021    LYMPHSABS 1.3 03/16/2021   MONOABS 0.6 03/16/2021   EOSABS 0.0 03/16/2021   BASOSABS 0.0 03/16/2021     Last metabolic panel Lab Results  Component Value Date   NA 134 (L) 03/24/2021   K 4.2 03/24/2021   CL 101 03/24/2021   CO2 25 03/24/2021   BUN 32 (H) 03/24/2021   CREATININE 1.09 03/24/2021   GLUCOSE 156 (H) 03/24/2021   GFRNONAA >60 03/24/2021   CALCIUM 8.5 (L) 03/24/2021   PHOS 3.5 03/17/2021   PROT 8.3 (H) 03/16/2021   ALBUMIN 3.8 03/16/2021   BILITOT 0.4 03/16/2021   ALKPHOS 75 03/16/2021   AST 23 03/16/2021   ALT 29 03/16/2021   ANIONGAP 8 03/24/2021    CBG (last 3)  Recent Labs    03/25/21 0021 03/25/21 0402 03/25/21 0800  GLUCAP 159* 90 167*      GFR: Estimated Creatinine Clearance: 86.9 mL/min (by C-G formula based on SCr of 1.09 mg/dL).  Coagulation Profile: No results for input(s): INR, PROTIME in the last 168 hours.  Recent Results (from the past 240 hour(s))  Blood culture (routine x 2)     Status: None   Collection Time: 03/17/21 12:03 AM   Specimen: BLOOD  Result Value Ref Range Status   Specimen Description BLOOD LEFT ARM  Final   Special Requests   Final    BOTTLES DRAWN AEROBIC AND ANAEROBIC Blood Culture adequate volume   Culture   Final    NO GROWTH 5 DAYS Performed at [facility], [address] [place], [place] [address]    Report Status 03/23/2021 FINAL  Final  Blood culture (routine x 2)     Status: None   Collection Time: 03/17/21 12:10 AM   Specimen: BLOOD  Result Value Ref Range Status   Specimen Description BLOOD RIGHT ARM  Final   Special Requests   Final    BOTTLES DRAWN AEROBIC ONLY Blood Culture adequate volume   Culture   Final    NO GROWTH 5 DAYS Performed at [facility], [address], [place], [place] [address]    Report Status 03/23/2021 FINAL  Final  SARS CORONAVIRUS 2 (TAT 6-24 HRS) Nasopharyngeal Nasopharyngeal Swab     Status: None   Collection Time: 03/17/21  4:55 AM   Specimen:  Nasopharyngeal Swab  Result Value Ref Range Status   SARS Coronavirus 2 NEGATIVE NEGATIVE Final    Comment: (NOTE) SARS-CoV-2 target nucleic acids are NOT DETECTED.  The SARS-CoV-2 RNA is generally detectable in upper and lower respiratory specimens during the acute phase of infection. Negative results do not preclude SARS-CoV-2 infection, do not rule out co-infections with other pathogens, and should not be used as the sole basis for treatment or other patient management decisions. Negative results must be combined with clinical observations, patient history, and epidemiological information. The expected result is Negative.  Fact Sheet for Patients: HairSlick.no  Fact Sheet for Healthcare Providers: quierodirigir.com  This test is not yet approved or cleared by the [place] FDA and  has been authorized for detection and/or diagnosis of SARS-CoV-2 by FDA under an Emergency Use Authorization (EUA). This EUA will remain  in effect (meaning this test can be used) for the duration of the COVID-19 declaration under Se ction 564(b)(1) of the Act, 21 U.S.C.  section 360bbb-3(b)(1), unless the authorization is terminated or revoked sooner.  Performed at [facility], [address], [place], [place] [address]   MRSA Next Gen by PCR, Nasal     Status: None   Collection Time: 03/20/21  3:33 PM   Specimen: Nasal Mucosa; Nasal Swab  Result Value Ref Range Status   MRSA by PCR Next Gen NOT DETECTED NOT DETECTED Final    Comment: (NOTE) The GeneXpert MRSA Assay (FDA approved for NASAL specimens only), is one component of a comprehensive MRSA colonization surveillance program. It is not intended to diagnose MRSA infection nor to guide or monitor treatment for MRSA infections. Test performance is not FDA approved in patients less than 18 years old. Performed at [facility], [address] [place], [place] [address]           Radiology Studies: No results found.      Scheduled Meds:  vitamin C  1,000 mg Oral Daily   docusate sodium  100 mg Oral Daily   DULoxetine  30 mg Oral Daily   gabapentin  300 mg Oral TID   insulin aspart  0-20 Units Subcutaneous Q4H   insulin glargine  5 Units Subcutaneous BID   losartan  25 mg Oral Daily   nutrition supplement (JUVEN)  1 packet Oral BID BM   oxyCODONE  20 mg Oral Q6H   pantoprazole  40 mg Oral Daily   senna-docusate  2 tablet Oral QHS   zinc sulfate  220 mg Oral Daily   Continuous Infusions:  magnesium sulfate bolus IVPB     methocarbamol (ROBAXIN) IV 500 mg (03/24/21 0051)     LOS: 8 days     Jacquelin Hawking, MD [facility] 03/25/2021, 10:25 AM  If 7PM-7AM, please contact night-coverage [email or web address]

## 2021-03-26 LAB — GLUCOSE, CAPILLARY
Glucose-Capillary: 105 mg/dL — ABNORMAL HIGH (ref 70–99)
Glucose-Capillary: 117 mg/dL — ABNORMAL HIGH (ref 70–99)
Glucose-Capillary: 136 mg/dL — ABNORMAL HIGH (ref 70–99)
Glucose-Capillary: 165 mg/dL — ABNORMAL HIGH (ref 70–99)
Glucose-Capillary: 175 mg/dL — ABNORMAL HIGH (ref 70–99)
Glucose-Capillary: 189 mg/dL — ABNORMAL HIGH (ref 70–99)

## 2021-03-26 NOTE — Plan of Care (Signed)
  Problem: Pain Managment: Goal: General experience of comfort will improve Outcome: Progressing   Problem: Safety: Goal: Ability to remain free from injury will improve Outcome: Progressing   

## 2021-03-26 NOTE — Progress Notes (Signed)
PROGRESS NOTE    Brett White  ZCH:[id number] DOB: 1961-01-13 DOA: 03/16/2021 PCP: Patient, [name]   Brief Narrative: Brett White is a 60 y.o. 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  Patient resented secondary to worsening severe left foot pain with associated erythema and edema concerning for possible recurrent osteomyelitis.  Orthopedic surgery was consulted.  Patient was empirically started on vancomycin, ceftriaxone, Flagyl.  Orthopedic surgery with plan for left transmetatarsal amputation.   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 Concern for recurrent osteomyelitis. Started empirically on Vancomycin/Ceftriaxone and Flagyl. Orthopedic surgery consulted and performed left transtibial amputation on 6/22. Blood cultures with no growth. Discontinued vancomycin, Ceftriaxone, Flagyl. PT/OT recommending SNF.  Post-op pain Likely will be a barrier to discharge. Orthopedic surgery managing pain regimen. Needs to transition of IV narcotics.  Leukocytosis Acutely elevated WBC s/p surgery. No new fevers. Blood cultures with no growth. Resolved.  AKI Creatinine of 1.41 on admission. Resolved.  Intractable nausea/vomiting Resolved.  Diabetes mellitus, type 2 Uncontrolled. Hemoglobin A1C of 11.5% from 5/22.   Diabetic polyneuropathy -Continue gabapentin  Primary hypertension Patient is on hydrochlorothiazide as ano outpatient. Started on losartan inpatient. -Continue losartan  Anxiety Depression Continue Cymbalta  GERD -Continue Protonix  Opioid induced constipation -Continue  Senokot   DVT prophylaxis: SCDs Code Status:   Code Status: Full Code Family Communication: None at bedside Disposition Plan: Discharge to SNF when bed is available. Medically stable for discharge pending final pain management recommendations by ortho   Consultants:  Orthopedic surgery  Procedures:  None  Antimicrobials: Ceftriaxone IV Vancomycin IV Flagyl    Subjective: Leg pain. No other concerns  Objective: Vitals:   03/25/21 0758 03/25/21 1556 03/25/21 2022 03/26/21 0756  BP: (!) 154/93 (!) 142/79 (!) 144/82 137/88  Pulse: 90 85 81 91  Resp: 16 17 16 18   Temp: 98.8 F (37.1 C) 98.7 F (37.1 C) 98 F (36.7 C) 98.8 F (37.1 C)  TempSrc: Oral Oral Oral Oral  SpO2: 98% 97% 94% 95%  Weight:      Height:        Intake/Output Summary (Last 24 hours) at 03/26/2021 1050 Last data filed at 03/26/2021 0700 Gross per 24 hour  Intake 600 ml  Output 725 ml  Net -125 ml    Filed Weights   03/17/21 0641  Weight: 97.5 kg    Examination:  General exam: Appears calm and comfortable Respiratory system: Clear to auscultation. Respiratory effort normal. Cardiovascular system: S1 & S2 heard, RRR. No murmurs, rubs, gallops or clicks. Gastrointestinal system: Abdomen is nondistended, soft and nontender. No organomegaly or masses felt. Normal bowel sounds heard. Central nervous system: Alert and oriented. No focal neurological deficits. Musculoskeletal: No edema. No calf tenderness. Left BKA Skin: No cyanosis. No rashes Psychiatry: Judgement and insight appear normal. Mood & affect appropriate.    Data Reviewed: I have personally reviewed following labs and imaging studies  CBC Lab Results  Component Value Date   WBC 9.8 03/24/2021   RBC 3.69 (L) 03/24/2021   HGB 10.6 (L) 03/24/2021   HCT 32.5 (L) 03/24/2021   MCV 88.1 03/24/2021  MCH 28.7 03/24/2021   PLT 252 03/24/2021   MCHC 32.6 03/24/2021   RDW 13.3 03/24/2021   LYMPHSABS 1.3 03/16/2021   MONOABS 0.6  03/16/2021   EOSABS 0.0 03/16/2021   BASOSABS 0.0 03/16/2021     Last metabolic panel Lab Results  Component Value Date   NA 134 (L) 03/24/2021   K 4.2 03/24/2021   CL 101 03/24/2021   CO2 25 03/24/2021   BUN 32 (H) 03/24/2021   CREATININE 1.09 03/24/2021   GLUCOSE 156 (H) 03/24/2021   GFRNONAA >60 03/24/2021   CALCIUM 8.5 (L) 03/24/2021   PHOS 3.5 03/17/2021   PROT 8.3 (H) 03/16/2021   ALBUMIN 3.8 03/16/2021   BILITOT 0.4 03/16/2021   ALKPHOS 75 03/16/2021   AST 23 03/16/2021   ALT 29 03/16/2021   ANIONGAP 8 03/24/2021    CBG (last 3)  Recent Labs    03/26/21 0018 03/26/21 0436 03/26/21 0727  GLUCAP 136* 105* 117*      GFR: Estimated Creatinine Clearance: 86.9 mL/min (by C-G formula based on SCr of 1.09 mg/dL).  Coagulation Profile: No results for input(s): INR, PROTIME in the last 168 hours.  Recent Results (from the past 240 hour(s))  Blood culture (routine x 2)     Status: None   Collection Time: 03/17/21 12:03 AM   Specimen: BLOOD  Result Value Ref Range Status   Specimen Description BLOOD LEFT ARM  Final   Special Requests   Final    BOTTLES DRAWN AEROBIC AND ANAEROBIC Blood Culture adequate volume   Culture   Final    NO GROWTH 5 DAYS Performed at [facility], [address], [place], [place] [address]    Report Status 03/23/2021 FINAL  Final  Blood culture (routine x 2)     Status: None   Collection Time: 03/17/21 12:10 AM   Specimen: BLOOD  Result Value Ref Range Status   Specimen Description BLOOD RIGHT ARM  Final   Special Requests   Final    BOTTLES DRAWN AEROBIC ONLY Blood Culture adequate volume   Culture   Final    NO GROWTH 5 DAYS Performed at [facility], [address], [place], [place] [address]    Report Status 03/23/2021 FINAL  Final  SARS CORONAVIRUS 2 (TAT 6-24 HRS) Nasopharyngeal Nasopharyngeal Swab     Status: None   Collection Time: 03/17/21  4:55 AM   Specimen: Nasopharyngeal Swab  Result Value Ref Range  Status   SARS Coronavirus 2 NEGATIVE NEGATIVE Final    Comment: (NOTE) SARS-CoV-2 target nucleic acids are NOT DETECTED.  The SARS-CoV-2 RNA is generally detectable in upper and lower respiratory specimens during the acute phase of infection. Negative results do not preclude SARS-CoV-2 infection, do not rule out co-infections with other pathogens, and should not be used as the sole basis for treatment or other patient management decisions. Negative results must be combined with clinical observations, patient history, and epidemiological information. The expected result is Negative.  Fact Sheet for Patients: HairSlick.no  Fact Sheet for Healthcare Providers: quierodirigir.com  This test is not yet approved or cleared by the [place] FDA and  has been authorized for detection and/or diagnosis of SARS-CoV-2 by FDA under an Emergency Use Authorization (EUA). This EUA will remain  in effect (meaning this test can be used) for the duration of the COVID-19 declaration under Se ction 564(b)(1) of the Act, 21 U.S.C. section 360bbb-3(b)(1), unless the authorization is terminated or revoked sooner.  Performed at [facility], [address] [place], [place] [address]   MRSA Next Gen by PCR, Nasal     Status: None   Collection Time: 03/20/21  3:33 PM   Specimen: Nasal Mucosa; Nasal Swab  Result Value Ref Range Status   MRSA by PCR Next Gen NOT DETECTED NOT DETECTED Final    Comment: (NOTE) The GeneXpert MRSA Assay (FDA approved for NASAL specimens only), is one component of a comprehensive MRSA colonization surveillance program. It is not intended to diagnose MRSA infection nor to guide or monitor treatment for MRSA infections. Test performance is not FDA approved in patients less than 28 years old. Performed at [facility], [address], [place], [place] [address]          Radiology Studies: No results  found.      Scheduled Meds:  vitamin C  1,000 mg Oral Daily   docusate sodium  100 mg Oral Daily   DULoxetine  30 mg Oral Daily   gabapentin  300 mg Oral TID   insulin aspart  0-20 Units Subcutaneous Q4H   insulin glargine  5 Units Subcutaneous BID   losartan  25 mg Oral Daily   nutrition supplement (JUVEN)  1 packet Oral BID BM   oxyCODONE  20 mg Oral Q6H   pantoprazole  40 mg Oral Daily   senna-docusate  2 tablet Oral QHS   zinc sulfate  220 mg Oral Daily   Continuous Infusions:  magnesium sulfate bolus IVPB     methocarbamol (ROBAXIN) IV 500 mg (03/24/21 0051)     LOS: 9 days     Jacquelin Hawking, MD [facility] 03/26/2021, 10:50 AM  If 7PM-7AM, please contact night-coverage [email or web address]

## 2021-03-27 LAB — GLUCOSE, CAPILLARY
Glucose-Capillary: 102 mg/dL — ABNORMAL HIGH (ref 70–99)
Glucose-Capillary: 160 mg/dL — ABNORMAL HIGH (ref 70–99)
Glucose-Capillary: 180 mg/dL — ABNORMAL HIGH (ref 70–99)
Glucose-Capillary: 192 mg/dL — ABNORMAL HIGH (ref 70–99)
Glucose-Capillary: 195 mg/dL — ABNORMAL HIGH (ref 70–99)
Glucose-Capillary: 241 mg/dL — ABNORMAL HIGH (ref 70–99)
Glucose-Capillary: 90 mg/dL (ref 70–99)

## 2021-03-27 MED ORDER — OXYCODONE HCL 10 MG PO TABS: 10.0000 mg | ORAL_TABLET | ORAL | 0 refills | Status: DC | PRN

## 2021-03-27 NOTE — Progress Notes (Signed)
PROGRESS NOTE    Brett White  QBH:[id number] DOB: July 11, 1961 DOA: 03/16/2021 PCP: Patient, [name]   Brief Narrative: Brett White is a 60 y.o. 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  Patient resented secondary to worsening severe left foot pain with associated erythema and edema concerning for possible recurrent osteomyelitis.  Orthopedic surgery was consulted.  Patient was empirically started on vancomycin, ceftriaxone, Flagyl.  Orthopedic surgery with plan for left transmetatarsal amputation.   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 Concern for recurrent osteomyelitis. Started empirically on Vancomycin/Ceftriaxone and Flagyl. Orthopedic surgery consulted and performed left transtibial amputation on 6/22. Blood cultures with no growth. Discontinued vancomycin, Ceftriaxone, Flagyl. PT/OT recommending SNF.  Post-op pain Likely will be a barrier to discharge. Orthopedic surgery managing pain regimen. Orthopedic surgery has adjusted his regimen to include oxycodone 20 mg QID in addition to oxycodone 10-15 mg q4 hours prn.  Leukocytosis Acutely elevated WBC s/p surgery. No new fevers. Blood cultures with no growth. Resolved.  AKI Creatinine of 1.41 on admission. Resolved.  Intractable nausea/vomiting Resolved.  Diabetes mellitus, type 2 Uncontrolled. Hemoglobin A1C of 11.5% from 5/22.   Diabetic polyneuropathy -Continue gabapentin  Primary hypertension Patient is on hydrochlorothiazide as ano outpatient. Started on losartan inpatient. -Continue  losartan  Anxiety Depression Continue Cymbalta  GERD -Continue Protonix  Opioid induced constipation -Continue Senokot   DVT prophylaxis: SCDs Code Status:   Code Status: Full Code Family Communication: None at bedside Disposition Plan: Discharge to SNF when bed is available. Medically stable for discharge pending final pain management recommendations by ortho   Consultants:  Orthopedic surgery  Procedures:  None  Antimicrobials: Ceftriaxone IV Vancomycin IV Flagyl    Subjective: Leg pain. No other concerns today.  Objective: Vitals:   03/26/21 0756 03/26/21 1300 03/26/21 2054 03/27/21 0736  BP: 137/88 (!) 140/94 131/82 (!) 144/91  Pulse: 91 83 82 83  Resp: 18 17 16 16   Temp: 98.8 F (37.1 C) 98 F (36.7 C) 98.4 F (36.9 C) 98.1 F (36.7 C)  TempSrc: Oral Oral Oral Oral  SpO2: 95% 98% 96% 93%  Weight:      Height:        Intake/Output Summary (Last 24 hours) at 03/27/2021 1023 Last data filed at 03/27/2021 03/29/2021 Gross per 24 hour  Intake 360 ml  Output 1700 ml  Net -1340 ml    Filed Weights   03/17/21 0641  Weight: 97.5 kg    Examination:  General exam: Appears calm and comfortable Respiratory system: Clear to auscultation. Respiratory effort normal. Cardiovascular system: S1 & S2 heard, RRR. No murmurs, rubs, gallops or clicks. Gastrointestinal system: Abdomen is nondistended, soft and nontender. No organomegaly or masses felt. Normal bowel sounds heard. Central nervous system: Alert and oriented. No focal neurological deficits. Musculoskeletal: Left BKA Skin: No cyanosis. No rashes Psychiatry: Judgement and insight appear normal. Mood & affect appropriate.    Data Reviewed: I have personally reviewed following labs and imaging studies  CBC Lab Results  Component Value Date   WBC 9.8 03/24/2021   RBC 3.69 (L) 03/24/2021   HGB 10.6 (L) 03/24/2021  HCT 32.5 (L) 03/24/2021   MCV 88.1 03/24/2021   MCH 28.7 03/24/2021   PLT 252  03/24/2021   MCHC 32.6 03/24/2021   RDW 13.3 03/24/2021   LYMPHSABS 1.3 03/16/2021   MONOABS 0.6 03/16/2021   EOSABS 0.0 03/16/2021   BASOSABS 0.0 03/16/2021     Last metabolic panel Lab Results  Component Value Date   NA 134 (L) 03/24/2021   K 4.2 03/24/2021   CL 101 03/24/2021   CO2 25 03/24/2021   BUN 32 (H) 03/24/2021   CREATININE 1.09 03/24/2021   GLUCOSE 156 (H) 03/24/2021   GFRNONAA >60 03/24/2021   CALCIUM 8.5 (L) 03/24/2021   PHOS 3.5 03/17/2021   PROT 8.3 (H) 03/16/2021   ALBUMIN 3.8 03/16/2021   BILITOT 0.4 03/16/2021   ALKPHOS 75 03/16/2021   AST 23 03/16/2021   ALT 29 03/16/2021   ANIONGAP 8 03/24/2021    CBG (last 3)  Recent Labs    03/27/21 0044 03/27/21 0427 03/27/21 0744  GLUCAP 241* 102* 90      GFR: Estimated Creatinine Clearance: 86.9 mL/min (by C-G formula based on SCr of 1.09 mg/dL).  Coagulation Profile: No results for input(s): INR, PROTIME in the last 168 hours.  Recent Results (from the past 240 hour(s))  MRSA Next Gen by PCR, Nasal     Status: None   Collection Time: 03/20/21  3:33 PM   Specimen: Nasal Mucosa; Nasal Swab  Result Value Ref Range Status   MRSA by PCR Next Gen NOT DETECTED NOT DETECTED Final    Comment: (NOTE) The GeneXpert MRSA Assay (FDA approved for NASAL specimens only), is one component of a comprehensive MRSA colonization surveillance program. It is not intended to diagnose MRSA infection nor to guide or monitor treatment for MRSA infections. Test performance is not FDA approved in patients less than 38 years old. Performed at [facility], [address] [place], [place] [address]          Radiology Studies: No results found.      Scheduled Meds:  vitamin C  1,000 mg Oral Daily   docusate sodium  100 mg Oral Daily   DULoxetine  30 mg Oral Daily   gabapentin  300 mg Oral TID   insulin aspart  0-20 Units Subcutaneous Q4H   insulin glargine  5 Units Subcutaneous BID   losartan  25 mg  Oral Daily   nutrition supplement (JUVEN)  1 packet Oral BID BM   oxyCODONE  20 mg Oral Q6H   pantoprazole  40 mg Oral Daily   senna-docusate  2 tablet Oral QHS   zinc sulfate  220 mg Oral Daily   Continuous Infusions:  magnesium sulfate bolus IVPB     methocarbamol (ROBAXIN) IV 500 mg (03/24/21 0051)     LOS: 10 days     Jacquelin Hawking, MD [facility] 03/27/2021, 10:23 AM  If 7PM-7AM, please contact night-coverage [email or web address]

## 2021-03-27 NOTE — Progress Notes (Signed)
Patient is status post left transtibial amputation day 4.  He has had difficulty with pain control.  Has a history of this.  Tells me about the fall he had over the weekend.  Vital signs stable afebrile patient was asleep when I walked in the room.  When he did awaken he immediately said he was having pain.  Wound VAC has good suction approximately 200 cc of bloody drainage  Patient will need discharge to skilled nursing.  We will place a prescription for pain medication on his chart.  Have discontinued his IV pain medication I explained this to him and he understands

## 2021-03-27 NOTE — TOC Progression Note (Addendum)
Transition of Care [facility]) - Progression Note    Patient Details  Name: Zaden Sako MRN: [id number] Date of Birth: July 02, 1961  Transition of Care [facility]) CM/SW Contact  Ralene Bathe, LCSWA Phone Number: 03/27/2021, 12:05 PM  Clinical Narrative:    CSW spoke with Marijean Niemann with admissions at [facility].  They are able to extend a bed over but availability will depend on when insurance authorization is approved as beds are first come first served.   CSW contacted [employer] to inquire about whether insurance is managed through them.  CSW was informed that the representative will have to speak with supervisor to inquire about who the insurance authorization is managed through.  CSW awaiting a returned call with information about who to contact to start insurance auth.    14:18-  CSW received a call from [facility] stating that they do not manage the insurance for the patient.  CSW contacted Marijean Niemann with [facility] and left a message informing her that the facility needs to start insurance auth.    16:50-  CSW received a returned call from [place] with [facility].  The facility is beginning insurance auth for the patient today.   Expected Discharge Plan: Skilled Nursing Facility Barriers to Discharge: English as a second language teacher, No SNF bed  Expected Discharge Plan and Services Expected Discharge Plan: Skilled Nursing Facility   Discharge Planning Services: CM Consult                                           Social Determinants of Health (SDOH) Interventions    Readmission Risk Interventions No flowsheet data found.

## 2021-03-27 NOTE — Progress Notes (Signed)
PTA entered room with alarm sounding as patient had got back to bed by himself.  Pt educated that he is a fall risk and to call for help if he needs to mobilize to reduce risk of repeated fall.  Pt reports," Waiting on them is like waiting for christmas."  Applied bed alarm and needs in reach.    Quentin Angst Acute Rehabilitation Services Pager [phone] Office [phone]

## 2021-03-27 NOTE — Progress Notes (Signed)
Physical Therapy Treatment Patient Details Name: Brett White MRN: [id number] DOB: [birth year] Today's Date: 03/27/2021    History of Present Illness Pt is 60 yo male who had L 1st ray amputation 02/04/21 and subsequent I&D 03/03/21 and presented with persistent pain in that foot with 2 episodes of N&V on 03/16/21. Pt admitted with concern for further osteomyelitis.  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  Fall in room with CNA on 03/24/21.  He fell on his residual limb.   PMH: DM2, back sx, GERD, neuropathy, anxiety/ depression.    PT Comments    Pt supine in bed on arrival this session.  He required max encouragement to participate in PT session.  He had a fall over weekend and was very anxious to progress mobility.  Performed OOB to recliner chair only.  Focused on B LE weakness.  Continue to recommend snf placement.    Follow Up Recommendations  SNF     Equipment Recommendations  Wheelchair cushion (measurements PT);Wheelchair (measurements PT)    Recommendations for Other Services       Precautions / Restrictions Precautions Precautions: Fall Precaution Comments: Fall on 03/24/21 with nursing- he is now very fearful of falling and needs encouragement. Required Braces or Orthoses: Other Brace Other Brace: Residual limb protector-to be on when up Restrictions Weight Bearing Restrictions: Yes LLE Weight Bearing: Non weight bearing    Mobility  Bed Mobility Overal bed mobility: Needs Assistance Bed Mobility: Supine to Sit     Supine to sit: Supervision     General bed mobility comments: Use of rails but no assistance to achieve sitting side of bed.    Transfers Overall transfer level: Needs assistance Equipment used: Rolling walker (2 wheeled) Transfers: Sit to/from Stand Sit to Stand: Min assist         General transfer comment: Pt required extra support and assist to stand.  Cues for hand placement and bed height elevated.  Ambulation/Gait Ambulation/Gait assistance: Min  assist Gait Distance (Feet): 4 Feet (steps from bed to chair.  Very fearful of falling so limited bed to chair.) Assistive device: Rolling walker (2 wheeled) Gait Pattern/deviations: Step-to pattern;Decreased step length - right;Decreased step length - left;Decreased weight shift to left Gait velocity: decreased   General Gait Details: hop pattern on  R LE with min assistance for safety.  No buckling noted this session but he is very fearful of falling. H refused further gait training this session.   Stairs             Wheelchair Mobility    Modified Rankin (Stroke Patients Only)       Balance Overall balance assessment: Needs assistance Sitting-balance support: Feet supported Sitting balance-Leahy Scale: Good     Standing balance support: Bilateral upper extremity supported;During functional activity Standing balance-Leahy Scale: Poor Standing balance comment: Pt bracing on RW, unable to maintain upright posture                            Cognition Arousal/Alertness: Awake/alert Behavior During Therapy: Anxious;Flat affect (mostly flat but does show some anxiety about mobilizing after his recent fall over the weekend) Overall Cognitive Status: Within Functional Limits for tasks assessed                                        Exercises General Exercises - Lower Extremity  Ankle Circles/Pumps: AROM;Right;10 reps;Supine Quad Sets: AROM;Both;10 reps;Supine Heel Slides: AROM;Both;10 reps;Supine Hip ABduction/ADduction: AROM;Both;10 reps;Supine Straight Leg Raises: AROM;AAROM;Both;10 reps;Supine    General Comments        Pertinent Vitals/Pain Pain Assessment: 0-10 Pain Score: 10-Worst pain ever Pain Location: L residual limb Pain Descriptors / Indicators: Aching;Constant;Discomfort;Grimacing;Guarding;Jabbing Pain Intervention(s): Monitored during session;Repositioned    Home Living                      Prior Function             PT Goals (current goals can now be found in the care plan section) Acute Rehab PT Goals Patient Stated Goal: To have less pain; get prosthesis Potential to Achieve Goals: Good Progress towards PT goals: Progressing toward goals    Frequency    Min 3X/week      PT Plan Current plan remains appropriate    Co-evaluation              AM-PAC PT "6 Clicks" Mobility   Outcome Measure  Help needed turning from your back to your side while in a flat bed without using bedrails?: None Help needed moving from lying on your back to sitting on the side of a flat bed without using bedrails?: A Little Help needed moving to and from a bed to a chair (including a wheelchair)?: A Little Help needed standing up from a chair using your arms (e.g., wheelchair or bedside chair)?: A Little Help needed to walk in hospital room?: A Little Help needed climbing 3-5 steps with a railing? : A Lot 6 Click Score: 18    End of Session Equipment Utilized During Treatment: Gait belt (limb guard) Activity Tolerance: Patient tolerated treatment well Patient left: in chair;with call bell/phone within reach;with chair alarm set Nurse Communication: Mobility status PT Visit Diagnosis: Unsteadiness on feet (R26.81);Difficulty in walking, not elsewhere classified (R26.2);Pain;Other abnormalities of gait and mobility (R26.89) Pain - Right/Left: Left Pain - part of body: Knee     Time: 8343-7357 PT Time Calculation (min) (ACUTE ONLY): 22 min  Charges:  $Therapeutic Exercise: 8-22 mins                     Bonney Leitz , PTA Acute Rehabilitation Services Pager [phone] Office [phone]    Brett White 03/27/2021, 11:37 AM

## 2021-03-28 LAB — GLUCOSE, CAPILLARY
Glucose-Capillary: 139 mg/dL — ABNORMAL HIGH (ref 70–99)
Glucose-Capillary: 108 mg/dL — ABNORMAL HIGH (ref 70–99)
Glucose-Capillary: 186 mg/dL — ABNORMAL HIGH (ref 70–99)
Glucose-Capillary: 196 mg/dL — ABNORMAL HIGH (ref 70–99)
Glucose-Capillary: 205 mg/dL — ABNORMAL HIGH (ref 70–99)

## 2021-03-28 LAB — SARS CORONAVIRUS 2 (TAT 6-24 HRS): SARS Coronavirus 2: NEGATIVE

## 2021-03-28 MED ORDER — POLYETHYLENE GLYCOL 3350 17 G PO PACK
17.0000 g | PACK | Freq: Two times a day (BID) | ORAL | Status: DC
  Administered 2021-03-28 – 2021-03-29 (×3): 17 g via ORAL
  Filled 2021-03-28 (×2): qty 1

## 2021-03-28 NOTE — Progress Notes (Signed)
Pt reports 10/10 pain throughout the entire night despite all PRN & scheduled pain medication administered as often as it was available (please see MAR for exact administration frequency). Pt endorses throbbing, pulsating pain in the LLE surgical incision site (stump), but also severe pulsing pain in his left "foot and ankle, like a heartbeat" without relief. Pt states that pain medication administered helps "a little, but wears off after about 30 minutes". By the time RN reassesses his pain level, pt reports pain is back to 10/10. Pt advised to inform MD when he comes to see pt today, pt verbalized understanding.

## 2021-03-28 NOTE — Progress Notes (Signed)
PROGRESS NOTE    Brett White  PZW:[id number] DOB: 13-Aug-1961 DOA: 03/16/2021 PCP: Patient, [name]   Brief Narrative: Brett White is a 60 y.o. 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  Patient resented secondary to worsening severe left foot pain with associated erythema and edema concerning for possible recurrent osteomyelitis.  Orthopedic surgery was consulted.  Patient was empirically started on vancomycin, ceftriaxone, Flagyl.  Orthopedic surgery with plan for left transmetatarsal amputation.   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 Concern for recurrent osteomyelitis. Started empirically on Vancomycin/Ceftriaxone and Flagyl. Orthopedic surgery consulted and performed left transtibial amputation on 6/22. Blood cultures with no growth. Discontinued vancomycin, Ceftriaxone, Flagyl. PT/OT recommending SNF.  Post-op pain Likely will be a barrier to discharge. Orthopedic surgery managing pain regimen. Orthopedic surgery has adjusted his regimen to include oxycodone 20 mg QID in addition to oxycodone 10-15 mg q4 hours prn.  Leukocytosis Acutely elevated WBC s/p surgery. No new fevers. Blood cultures with no growth. Resolved.  AKI Creatinine of 1.41 on admission. Resolved.  Intractable nausea/vomiting Resolved.  Diabetes mellitus, type 2 Uncontrolled. Hemoglobin A1C of 11.5% from 5/22.   Diabetic polyneuropathy -Continue gabapentin  Primary hypertension Patient is on hydrochlorothiazide as an outpatient. Started on losartan inpatient. -Continue  losartan  Anxiety Depression Continue Cymbalta  GERD -Continue Protonix  Opioid induced constipation -Continue Senokot -Miralax BID   DVT prophylaxis: SCDs Code Status:   Code Status: Full Code Family Communication: None at bedside Disposition Plan: Discharge to SNF when bed is available. Medically stable for discharge pending final pain management recommendations by ortho   Consultants:  Orthopedic surgery  Procedures:  None  Antimicrobials: Ceftriaxone IV Vancomycin IV Flagyl    Subjective: Continue leg pain. Patient states his pain won't go down to a zero. We discussed the goal is not zero pain, but pain control allowing him to be functional.  Objective: Vitals:   03/27/21 0736 03/27/21 1417 03/27/21 2210 03/28/21 0824  BP: (!) 144/91 (!) 153/90 (!) 158/91 (!) 152/101  Pulse: 83 84 81 92  Resp: 16 17 16 18   Temp: 98.1 F (36.7 C) 98.3 F (36.8 C) 98.3 F (36.8 C) 98.3 F (36.8 C)  TempSrc: Oral Oral Oral   SpO2: 93% 96% 99% 96%  Weight:      Height:        Intake/Output Summary (Last 24 hours) at 03/28/2021 1244 Last data filed at 03/28/2021 1022 Gross per 24 hour  Intake 100 ml  Output 1275 ml  Net -1175 ml    Filed Weights   03/17/21 0641  Weight: 97.5 kg    Examination:  General exam: Appears calm and comfortable Respiratory system: Clear to auscultation. Respiratory effort normal. Cardiovascular system: S1 & S2 heard, RRR. No murmurs, rubs, gallops or clicks. Gastrointestinal system: Abdomen is mildly distended, soft and nontender. No organomegaly or masses felt. Decreased bowel sounds heard. Central nervous system: Alert and oriented. No focal neurological deficits. Musculoskeletal: No edema. No calf tenderness. Left BKA Skin: No cyanosis. No rashes Psychiatry: Judgement and insight appear normal. Mood & affect appropriate.    Data Reviewed: I have  personally reviewed following labs and imaging studies  CBC Lab Results  Component  Value Date   WBC 9.8 03/24/2021   RBC 3.69 (L) 03/24/2021   HGB 10.6 (L) 03/24/2021   HCT 32.5 (L) 03/24/2021   MCV 88.1 03/24/2021   MCH 28.7 03/24/2021   PLT 252 03/24/2021   MCHC 32.6 03/24/2021   RDW 13.3 03/24/2021   LYMPHSABS 1.3 03/16/2021   MONOABS 0.6 03/16/2021   EOSABS 0.0 03/16/2021   BASOSABS 0.0 03/16/2021     Last metabolic panel Lab Results  Component Value Date   NA 134 (L) 03/24/2021   K 4.2 03/24/2021   CL 101 03/24/2021   CO2 25 03/24/2021   BUN 32 (H) 03/24/2021   CREATININE 1.09 03/24/2021   GLUCOSE 156 (H) 03/24/2021   GFRNONAA >60 03/24/2021   CALCIUM 8.5 (L) 03/24/2021   PHOS 3.5 03/17/2021   PROT 8.3 (H) 03/16/2021   ALBUMIN 3.8 03/16/2021   BILITOT 0.4 03/16/2021   ALKPHOS 75 03/16/2021   AST 23 03/16/2021   ALT 29 03/16/2021   ANIONGAP 8 03/24/2021    CBG (last 3)  Recent Labs    03/28/21 0353 03/28/21 0822 03/28/21 1131  GLUCAP 139* 108* 186*      GFR: Estimated Creatinine Clearance: 86.9 mL/min (by C-G formula based on SCr of 1.09 mg/dL).  Coagulation Profile: No results for input(s): INR, PROTIME in the last 168 hours.  Recent Results (from the past 240 hour(s))  MRSA Next Gen by PCR, Nasal     Status: None   Collection Time: 03/20/21  3:33 PM   Specimen: Nasal Mucosa; Nasal Swab  Result Value Ref Range Status   MRSA by PCR Next Gen NOT DETECTED NOT DETECTED Final    Comment: (NOTE) The GeneXpert MRSA Assay (FDA approved for NASAL specimens only), is one component of a comprehensive MRSA colonization surveillance program. It is not intended to diagnose MRSA infection nor to guide or monitor treatment for MRSA infections. Test performance is not FDA approved in patients less than 48 years old. Performed at [facility], [address], [place], [place] [address]          Radiology Studies: No results found.      Scheduled Meds:  vitamin C  1,000 mg Oral Daily   docusate sodium  100 mg Oral Daily    DULoxetine  30 mg Oral Daily   gabapentin  300 mg Oral TID   insulin aspart  0-20 Units Subcutaneous Q4H   insulin glargine  5 Units Subcutaneous BID   losartan  25 mg Oral Daily   nutrition supplement (JUVEN)  1 packet Oral BID BM   oxyCODONE  20 mg Oral Q6H   pantoprazole  40 mg Oral Daily   senna-docusate  2 tablet Oral QHS   zinc sulfate  220 mg Oral Daily   Continuous Infusions:  magnesium sulfate bolus IVPB     methocarbamol (ROBAXIN) IV 500 mg (03/24/21 0051)     LOS: 11 days     Jacquelin Hawking, MD [facility] 03/28/2021, 12:44 PM  If 7PM-7AM, please contact night-coverage [email or web address]

## 2021-03-28 NOTE — Progress Notes (Signed)
Occupational Therapy Treatment Patient Details Name: Brett White MRN: [id number] DOB: [birth year] Today's Date: 03/28/2021    History of present illness Pt is 60 yo male who had L 1st ray amputation 02/04/21 and subsequent I&D 03/03/21 and presented with persistent pain in that foot with 2 episodes of N&V on 03/16/21. Pt admitted with concern for further osteomyelitis.  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  Fall in room with CNA on 03/24/21.  He fell on his residual limb.   PMH: DM2, back sx, GERD, neuropathy, anxiety/ depression.   OT comments  [name] is progressing well. He completed bathing and grooming tasks while sitting EOB, set up for all sitting tasks and min A for standing tasks with rw for balance in standing. Pt experienced 1 LOB during rear pericare in standing and required min A to steady. Stand-pivot to chair with min A, pt expressed fear of falling throughout session. Pt continues to benefit from OT acutely to progress function in all ADLs and mobility. D/c plan remains appropriate.    Follow Up Recommendations  SNF    Equipment Recommendations  None recommended by OT       Precautions / Restrictions Precautions Precautions: Fall Precaution Comments: Fall on 03/24/21 with nursing- he is now very fearful of falling and needs encouragement. Required Braces or Orthoses: Other Brace Other Brace: Residual limb protector-to be on when up Restrictions Weight Bearing Restrictions: Yes LLE Weight Bearing: Non weight bearing       Mobility Bed Mobility Overal bed mobility: Needs Assistance Bed Mobility: Supine to Sit     Supine to sit: Supervision     General bed mobility comments: Use of rails but no assistance to achieve sitting side of bed.    Transfers Overall transfer level: Needs assistance Equipment used: Rolling walker (2 wheeled) Transfers: Sit to/from [employer] Sit to Stand: Min guard Stand pivot transfers: Min assist       General transfer  comment: min A for steadying    Balance Overall balance assessment: Needs assistance Sitting-balance support: Feet supported Sitting balance-Leahy Scale: Good     Standing balance support: Single extremity supported;During functional activity Standing balance-Leahy Scale: Fair Standing balance comment: pt with 1 LOB during fucntional task required minA to steady             ADL either performed or assessed with clinical judgement   ADL Overall ADL's : Needs assistance/impaired         Upper Body Bathing: Set up;Sitting Upper Body Bathing Details (indicate cue type and reason): pt bathed EOB - educated throguhout on importance of drying floor prior to ambulating Lower Body Bathing: Minimal assistance;Sit to/from stand Lower Body Bathing Details (indicate cue type and reason): sit<>stand wtih RW - pt wtih 1 LOB during rear bathing requiring min A to steady Upper Body Dressing : Set up;Sitting                   Functional mobility during ADLs: Minimal assistance;Rolling walker;Cueing for safety General ADL Comments: Completed bathing and grooming tasks while sitting EOB - educated pt on importance of safety during ADLs, sitting for as much as possible, and requesting assistance for standing tasks; pt verbalized understanding      Cognition Arousal/Alertness: Awake/alert Behavior During Therapy: Anxious;Flat affect Overall Cognitive Status: Within Functional Limits for tasks assessed Area of Impairment: Safety/judgement;Awareness;Problem solving           Safety/Judgement: Decreased awareness of safety;Decreased awareness of deficits Awareness: Emergent  Problem Solving: Requires verbal cues;Slow processing General Comments: pt poor problem solving and safety awareness              General Comments VSS on RA - no new concerns, pt expressed fear of falling    Pertinent Vitals/ Pain       Pain Assessment: Faces Faces Pain Scale: Hurts little more Pain  Location: L residual limb Pain Descriptors / Indicators: Discomfort;Guarding;Grimacing   Frequency  Min 2X/week        Progress Toward Goals  OT Goals(current goals can now be found in the care plan section)  Progress towards OT goals: Progressing toward goals  Acute Rehab OT Goals Patient Stated Goal: To have less pain; get prosthesis OT Goal Formulation: With patient Time For Goal Achievement: 04/06/21 Potential to Achieve Goals: Good ADL Goals Pt Will Perform Grooming: with supervision;standing Pt Will Perform Lower Body Bathing: with supervision;sitting/lateral leans;sit to/from stand Pt Will Perform Lower Body Dressing: with supervision;sit to/from stand Pt Will Transfer to Toilet: with supervision;stand pivot transfer Pt Will Perform Toileting - Clothing Manipulation and hygiene: with supervision;sitting/lateral leans;sit to/from stand Pt Will Perform Tub/Shower Transfer: with modified independence;tub bench  Plan Discharge plan remains appropriate;Frequency remains appropriate       AM-PAC OT "6 Clicks" Daily Activity     Outcome Measure   Help from another person eating meals?: A Little Help from another person taking care of personal grooming?: A Little Help from another person toileting, which includes using toliet, bedpan, or urinal?: A Little Help from another person bathing (including washing, rinsing, drying)?: A Little Help from another person to put on and taking off regular upper body clothing?: A Little Help from another person to put on and taking off regular lower body clothing?: A Little 6 Click Score: 18    End of Session Equipment Utilized During Treatment: Gait belt;Rolling walker  OT Visit Diagnosis: Repeated falls (R29.6);Muscle weakness (generalized) (M62.81);Other abnormalities of gait and mobility (R26.89) Pain - Right/Left: Left Pain - part of body: Leg   Activity Tolerance Patient tolerated treatment well   Patient Left in chair;with  call bell/phone within reach   Nurse Communication Mobility status        Time: 1431-1457 OT Time Calculation (min): 26 min  Charges: OT General Charges $OT Visit: 1 Visit OT Treatments $Self Care/Home Management : 23-37 mins     Aviella Disbrow A Vonette Grosso 03/28/2021, 3:32 PM

## 2021-03-28 NOTE — TOC Progression Note (Addendum)
Transition of Care [facility]) - Progression Note    Patient Details  Name: Brett White MRN: [id number] Date of Birth: August 03, 1961  [facility] CM/SW Contact  Ralene Bathe, LCSWA Phone Number: 03/28/2021, 3:47 PM  Clinical Narrative:    CSW received a call from [place] with [facility] the insurance authorization for the patient has been approved and the facility can accept the patient tomorrow.    15:52-  Care team notified of the information above and team informed of the need for a COVID test prior to d/c.   Expected Discharge Plan: Skilled Nursing Facility Barriers to Discharge: English as a second language teacher, No SNF bed  Expected Discharge Plan and Services Expected Discharge Plan: Skilled Nursing Facility   Discharge Planning Services: CM Consult                                           Social Determinants of Health (SDOH) Interventions    Readmission Risk Interventions No flowsheet data found.

## 2021-03-29 LAB — CBC
HCT: 38.1 % — ABNORMAL LOW (ref 39.0–52.0)
Hemoglobin: 12.4 g/dL — ABNORMAL LOW (ref 13.0–17.0)
MCH: 28.3 pg (ref 26.0–34.0)
MCHC: 32.5 g/dL (ref 30.0–36.0)
MCV: 87 fL (ref 80.0–100.0)
Platelets: 251 10*3/uL (ref 150–400)
RBC: 4.38 MIL/uL (ref 4.22–5.81)
RDW: 12.9 % (ref 11.5–15.5)
WBC: 6.4 10*3/uL (ref 4.0–10.5)
nRBC: 0 % (ref 0.0–0.2)

## 2021-03-29 LAB — BASIC METABOLIC PANEL
Anion gap: 10 (ref 5–15)
BUN: 23 mg/dL — ABNORMAL HIGH (ref 6–20)
CO2: 28 mmol/L (ref 22–32)
Calcium: 9 mg/dL (ref 8.9–10.3)
Chloride: 96 mmol/L — ABNORMAL LOW (ref 98–111)
Creatinine, Ser: 0.84 mg/dL (ref 0.61–1.24)
GFR, Estimated: >60 mL/min (ref >60)
Glucose, Bld: 150 mg/dL — ABNORMAL HIGH (ref 70–99)
Potassium: 4.7 mmol/L (ref 3.5–5.1)
Sodium: 134 mmol/L — ABNORMAL LOW (ref 135–145)

## 2021-03-29 LAB — GLUCOSE, CAPILLARY
Glucose-Capillary: 132 mg/dL — ABNORMAL HIGH (ref 70–99)
Glucose-Capillary: 216 mg/dL — ABNORMAL HIGH (ref 70–99)
Glucose-Capillary: 98 mg/dL (ref 70–99)
Glucose-Capillary: 212 mg/dL — ABNORMAL HIGH (ref 70–99)

## 2021-03-29 MED ORDER — LOSARTAN POTASSIUM 25 MG PO TABS: 25.0000 mg | ORAL_TABLET | Freq: Every day | ORAL | 0 refills | Status: DC

## 2021-03-29 MED ORDER — ASPIRIN 81 MG PO TBEC: 81.0000 mg | DELAYED_RELEASE_TABLET | Freq: Every day | ORAL | 0 refills | Status: AC

## 2021-03-29 MED ORDER — ACETAMINOPHEN 325 MG PO TABS: 325.0000 mg | ORAL_TABLET | Freq: Four times a day (QID) | ORAL | 0 refills | Status: DC | PRN

## 2021-03-29 MED ORDER — ASCORBIC ACID 1000 MG PO TABS: 1000.0000 mg | ORAL_TABLET | Freq: Every day | ORAL | Status: DC

## 2021-03-29 MED ORDER — INSULIN GLARGINE 100 UNIT/ML ~~LOC~~ SOLN: 5.0000 [IU] | Freq: Two times a day (BID) | SUBCUTANEOUS | 11 refills | Status: DC

## 2021-03-29 MED ORDER — SILVER SULFADIAZINE 1 % EX CREA
TOPICAL_CREAM | Freq: Every day | CUTANEOUS | Status: DC
  Filled 2021-03-29: qty 85

## 2021-03-29 MED ORDER — JUVEN PO PACK: 1.0000 | PACK | Freq: Two times a day (BID) | ORAL | 0 refills | Status: DC

## 2021-03-29 MED ORDER — ASPIRIN 81 MG PO TBEC: 81.0000 mg | DELAYED_RELEASE_TABLET | Freq: Every day | ORAL | 11 refills | Status: DC

## 2021-03-29 MED ORDER — SENNOSIDES-DOCUSATE SODIUM 8.6-50 MG PO TABS: 2.0000 | ORAL_TABLET | Freq: Every day | ORAL | Status: DC

## 2021-03-29 MED ORDER — ASPIRIN EC 81 MG PO TBEC
81.0000 mg | DELAYED_RELEASE_TABLET | Freq: Every day | ORAL | Status: DC
  Administered 2021-03-29: 81 mg via ORAL
  Filled 2021-03-29: qty 1

## 2021-03-29 MED ORDER — PHENOL 1.4 % MT LIQD
1.0000 | OROMUCOSAL | 0 refills | Status: DC | PRN
Start: 2021-03-29 — End: 2021-05-10

## 2021-03-29 MED ORDER — POLYETHYLENE GLYCOL 3350 17 G PO PACK: 17.0000 g | PACK | Freq: Two times a day (BID) | ORAL | 0 refills | Status: DC

## 2021-03-29 MED ORDER — GUAIFENESIN-DM 100-10 MG/5ML PO SYRP: 15.0000 mL | ORAL_SOLUTION | ORAL | 0 refills | Status: DC | PRN

## 2021-03-29 MED ORDER — SILVER SULFADIAZINE 1 % EX CREA
TOPICAL_CREAM | Freq: Every day | CUTANEOUS | 0 refills | Status: DC
Start: 2021-03-30 — End: 2021-05-10

## 2021-03-29 NOTE — Progress Notes (Signed)
Patient is 1 week status post below-knee amputation.  He did have a fall onto the amputation stump over the weekend.  This increased his pain and drainage.  He has had about 150 cc of bloody drainage no further drainage today.  Patient was informed that wound VAC will be removed today.  I did tell him that this would cause some discomfort.  Wound VAC was removed patient was yelling superlatives while this was occurring.  He also said he was going to "call the news when he got out of here ".  Incision he has well apposed wound edges but bruising and a area of skin degloving over the medial side with blood clot over the top most likely from trauma over the weekend.  Blood clot was removed.  No wound dehiscence was noted.  Patient was very difficult when applying a new dressing.  Placed limb protector.  Ordered Silvadene to be applied to open area of skin and dry dressing to the rest of the amputation stump.  There is no signs of infection.

## 2021-03-29 NOTE — Discharge Summary (Signed)
Physician Discharge Summary  Craven Crean ZJI:[id number] DOB: 1961/07/28 DOA: 03/16/2021  PCP: Patient, [name]  Admit date: 03/16/2021 Discharge date: 03/29/2021  Admitted From: Home Disposition: SNF  Recommendations for Outpatient Follow-up:  Follow up with PCP in 1-2 weeks Follow up with Orthopedic Surgery within 1-2 weeks Please obtain CMP/CBC, Mag, Phos in one week Please follow up on the following pending results:  Home Health: No Equipment/Devices: Wound VAC  Discharge Condition: Stable CODE STATUS: FULL CODE  Diet recommendation: Carb Modified Diet   Brief/Interim Summary: The patient is a 60 year old Caucasian male with a past medical history significant for but not limited to left great toe osteomyelitis, 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  Patient stepped on a thumbtack that was rested and subsequently worsened.  Orthopedic surgery was consulted.  Patient was empirically started on IV vancomycin, ceftriaxone and Flagyl for his infection.  Subsequently his infection did not improve and he underwent a left transmetatarsal amputation on 03/22/2021.  Blood cultures remain no growth and then his antibiotics.  Is continued.  PT OT evaluated and recommending SNF.  He had postoperative pain which was a barrier to discharge however orthopedic surgery adjusted his regimen.  He is improved and stable to be discharged to skilled nursing facility currently and will need to follow-up with PCP and orthopedic surgery within 1 to 2 weeks.  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 Surgical Pathology revealed: LEG, LEFT BELOW KNEE, AMPUTATION:  - Acute inflammation with granulation tissue and necrosis.  - Osteomyelitis.  - Calcified atherosclerosis.  -His IV antibiotics including vancomycin, ceftriaxone Flagyl have not been discontinued -Pain control per orthopedic surgery -PT OT recommending SNF -Patient had a fall onto the amputation stump over the weekend and this increases pain and drainage.  He had 150 cc of bloody drainage but no further drainage today and orthopedic surgery PA changed out his stump dressing and will place a limb protector and will continue wound VAC -We will need follow-up with orthopedic surgery in outpatient setting -VTE Prophylaxis with ASA 81 mg Daily x 2 weeks per Ortho  Postoperative pain -Orthopedic surgery managing pain regimen -Pain medication has been adjusted and will defer to them for discharge regimen  Leukocytosis -With acutely elevated status post surgery -He has no new fevers and blood cultures show no growth to date -WBC went from 12.0 and then trended down to 6.4 now -Continue to monitor for signs and symptoms of infection -Currently no overt infection noted  AKI -He had a creatinine of 1.4 on admission -Patient's BUNs/creatinine has now improved and is now 23/0.84 -Avoid further nephrotoxic medications, contrast  dyes, hypotension and renally adjust medications -We will  continue losartan and will discontinue his hydrochlorothiazide -Continue monitor and trend and repeat CMP within 1 week  Intractable nausea and vomiting -Improved and resolved -Continue with antiemetics as needed  Uncontrolled diabetes mellitus type 2 with hyperglycemia -Uncontrolled with a hemoglobin A1c of 11.5 from 02/19/2021 -Resumed Lantus 5 mg subcu twice daily -Continue with sliding scale insulin -CBGs ranging from 108-216  Diabetic Polyneuropathy -Continue with gabapentin  Essential Hypertension -He has been on hydrochlorothiazide as an outpatient we will hold given his hyponatremia and will start him on losartan and will continue losartan 25 mg p.o. daily  Anxiety and Depression -Continue Cymbalta  GERD -Continue with pantoprazole  Opioid-induced constipation -Continue senna docusate and MiraLAX twice daily and if necessary will need a suppository  Hyponatremia -Mild. Na+ is 134 -Continue to Monitor and Trend and Repeat CMP within 1 week  Normocytic Anemia -Patient's Hgb/Hct is now gone from 10.6/32.5 -> 12.4/38.1 -Check Anemia Panel in the outpatient setting -Continue to Monitor for S/Sx of Bleeding; No overt bleeding noted -Repeat CBC within 1 week  Discharge Instructions  Discharge Instructions     Apply dressing   Complete by: As directed    May cleanse amputation stump daily with antibacterial soap and water.  Apply thin layer of Silvadene over abraded skin area.  Apply clean dry dressing to rest of wound.      Allergies as of 03/29/2021   No Known Allergies      Medication List     STOP taking these medications    diphenhydramine-acetaminophen 25-500 MG Tabs tablet Commonly known as: TYLENOL PM   docusate sodium 100 MG capsule Commonly known as: COLACE   doxycycline 100 MG tablet Commonly known as: VIBRA-TABS   hydrochlorothiazide 12.5 MG tablet Commonly known as: HYDRODIURIL   ibuprofen 600 MG tablet Commonly known as: ADVIL    Lantus SoloStar 100 UNIT/ML Solostar Pen Generic drug: insulin glargine Replaced by: insulin glargine 100 UNIT/ML injection   senna 8.6 MG Tabs tablet Commonly known as: SENOKOT       TAKE these medications    acetaminophen 325 MG tablet Commonly known as: TYLENOL Take 1-2 tablets (325-650 mg total) by mouth every 6 (six) hours as needed for mild pain (pain score 1-3 or temp > 100.5).   ascorbic acid 1000 MG tablet Commonly known as: VITAMIN C Take 1 tablet (1,000 mg total) by mouth daily. Start taking on: March 30, 2021   DULoxetine 30 MG capsule Commonly known as: CYMBALTA Take 30 mg by mouth daily.   guaiFENesin-dextromethorphan 100-10 MG/5ML syrup Commonly known as: ROBITUSSIN DM Take 15 mLs by mouth every 4 (four) hours as needed for cough.   insulin glargine 100 UNIT/ML injection Commonly known as: LANTUS Inject 0.05 mLs (5 Units total) into the skin 2 (two) times daily. Replaces: Lantus SoloStar 100 UNIT/ML Solostar Pen   losartan 25 MG tablet Commonly known as: COZAAR Take 1 tablet (25 mg total) by mouth daily. Start taking on: March 30, 2021   methocarbamol 500 MG tablet Commonly known as: ROBAXIN Take 1 tablet (500 mg total) by mouth every 6 (six) hours as needed for muscle spasms.   multivitamin with minerals Tabs tablet Take 1 tablet by mouth daily.   NovoLOG FlexPen 100 UNIT/ML FlexPen Generic drug: insulin aspart Inject 5 Units into the skin with breakfast, with lunch, and with evening meal.   nutrition supplement (JUVEN) Pack Take 1 packet by mouth 2 (two) times daily  between meals.   Oxycodone HCl 10 MG Tabs Take 1-1.5 tablets (10-15 mg total) by mouth every 4 (four) hours as needed for severe pain (pain score 7-10). What changed: reasons to take this   pantoprazole 40 MG tablet Commonly known as: PROTONIX Take 1 tablet (40 mg total) by mouth daily.   phenol 1.4 % Liqd Commonly known as: CHLORASEPTIC Use as directed 1 spray in the mouth  or throat as needed for throat irritation / pain.   polyethylene glycol 17 g packet Commonly known as: MIRALAX / GLYCOLAX Take 17 g by mouth 2 (two) times daily. What changed: when to take this   senna-docusate 8.6-50 MG tablet Commonly known as: Senokot-S Take 2 tablets by mouth at bedtime.   silver sulfADIAZINE 1 % cream Commonly known as: SILVADENE Apply topically daily. Start taking on: March 30, 2021   TUMS PO Take 1 tablet by mouth 3 (three) times daily with meals. What changed: Another medication with the same name was removed. Continue taking this medication, and follow the directions you see here.   zinc sulfate 220 (50 Zn) MG capsule Take 1 capsule (220 mg total) by mouth daily.       ASK your doctor about these medications    gabapentin 300 MG capsule Commonly known as: NEURONTIN Take 2 capsules (600 mg total) by mouth 3 (three) times daily.   ondansetron 4 MG disintegrating tablet Commonly known as: Zofran ODT  ODT q4 hours prn nausea/vomit        Follow-up Information     Adonis Huguenin, NP Follow up in 1 week(s).   Specialty: Orthopedic Surgery Contact information: [address] [place] [address] [phone]                No Known Allergies  Consultations: Orthopedic Surgery  Procedures/Studies: MR FOOT LEFT WO CONTRAST  Result Date: 03/18/2021 CLINICAL DATA:  Diabetic foot infection. History of first ray amputation and debridement on 03/03/2021 EXAM: MRI OF THE LEFT FOOT WITHOUT CONTRAST TECHNIQUE: Multiplanar, multisequence MR imaging of the left forefoot was performed. No intravenous contrast was administered. COMPARISON:  X-ray 03/17/2021 FINDINGS: Bones/Joint/Cartilage Status post first ray resection at the level of first tarsometatarsal joint. Bone marrow edema with confluent low T1 signal changes throughout the proximal phalanx of the left second toe compatible with acute osteomyelitis (series 6, images 18-20). There is  bone marrow edema throughout the second metatarsal extending from the metatarsal head to the metatarsal base. Subtle intermediate T1 signal within the second metatarsal head is suggestive of early acute osteomyelitis (series 6, image 23). Marrow edema within the medial cuneiform with erosive changes along the distal margin and decreased T1 marrow signal are compatible with acute osteomyelitis (series 6, image 21). Suspect nondisplaced fracture involving the distal phalanx of the fifth toe with associated bone marrow edema. Remaining osseous structures are intact. No dislocation. Similar degree of arthropathy at the TMT joints compared to prior. Ligaments Lisfranc ligament is not well defined secondary to erosive changes within the medial cuneiform and adjacent soft tissue inflammation. Muscles and Tendons Chronic denervation changes of the intrinsic foot musculature. Amputation changes to the flexor and extensor tendons of the great toe. Soft tissues Fluid collection with small rounded low-density material within the resection bed of the first metatarsal compatible with antibiotic beads. The collection measures 6.9 x 2.3 x 2.5 cm and has a small sinus tract extending to the skin surface at the dorsomedial aspect of the forefoot (series 5, image 24).  No additional fluid collections. IMPRESSION: 1. Status post first ray resection at the level of the first tarsometatarsal joint. Fluid collection with antibiotic beads within the resection bed measuring 6.9 x 2.3 x 2.5 cm and has a small sinus tract extending to the skin surface at the dorsomedial aspect of the forefoot. An infected postoperative collection is not excluded. 2. Acute osteomyelitis of the proximal phalanx of the left second toe. 3. Early acute osteomyelitis of the second metatarsal, most convincing at the second metatarsal head. 4. Acute osteomyelitis of the medial cuneiform. 5. Suspect nondisplaced fracture involving the distal phalanx of the fifth toe  with associated bone marrow edema. These results will be called to the ordering clinician or representative by the Radiologist Assistant, and communication documented in the PACS or [employer]. Electronically Signed   By: Duanne Guess D.O.   On: 03/18/2021 11:16   DG Foot Complete Left  Result Date: 03/17/2021 CLINICAL DATA:  Recent amputation with worsening infection and bleeding. EXAM: LEFT FOOT - COMPLETE 3+ VIEW COMPARISON:  03/02/2021 FINDINGS: Postoperative changes with amputation of the left first ray at the tarsometatarsal level. Foreign material in the postoperative soft tissues consistent with packing material. No radiopaque soft tissue gas collections. Bones appear unchanged since prior study. Degenerative changes in the intertarsal joints. IMPRESSION: Postoperative amputation of the left first ray at the tarsometatarsal level. No significant changes in appearance since previous study. Electronically Signed   By: Burman Nieves M.D.   On: 03/17/2021 00:45   XR Foot 2 Views Left  Result Date: 03/02/2021 2 view radiographs of the left foot shows the first ray amputation with some irregularity at the distal aspect of the medial cuneiform.   Subjective: Seen and examined at bedside and had some pain this morning with his dressing change.  No nausea or vomiting.  Feels okay.  Denies any lightheadedness or dizziness.  No other concerns or complaints at this time.  Discharge Exam: Vitals:   03/28/21 1957 03/29/21 0803  BP: 136/81 (!) 155/95  Pulse: 84 90  Resp: 17 17  Temp: 98.4 F (36.9 C) 98.2 F (36.8 C)  SpO2: 96% 95%   Vitals:   03/28/21 0824 03/28/21 1500 03/28/21 1957 03/29/21 0803  BP: (!) 152/101 (!) 140/96 136/81 (!) 155/95  Pulse: 92 95 84 90  Resp: 18 18 17 17   Temp: 98.3 F (36.8 C) 98.6 F (37 C) 98.4 F (36.9 C) 98.2 F (36.8 C)  TempSrc:  Oral Oral Oral  SpO2: 96% 95% 96% 95%  Weight:      Height:       General: Pt is alert, awake, not in acute  distress Cardiovascular: RRR, S1/S2 +, no rubs, no gallops Respiratory: Diminished bilaterally, no wheezing, no rhonchi Abdominal: Soft, NT, Mildly Distended, bowel sounds + Extremities: Left Leg Transtibial Amputation in [place]  The results of significant diagnostics from this hospitalization (including imaging, microbiology, ancillary and laboratory) are listed below for reference.    Microbiology: Recent Results (from the past 240 hour(s))  MRSA Next Gen by PCR, Nasal     Status: None   Collection Time: 03/20/21  3:33 PM   Specimen: Nasal Mucosa; Nasal Swab  Result Value Ref Range Status   MRSA by PCR Next Gen NOT DETECTED NOT DETECTED Final    Comment: (NOTE) The GeneXpert MRSA Assay (FDA approved for NASAL specimens only), is one component of a comprehensive MRSA colonization surveillance program. It is not intended to diagnose MRSA infection nor to guide  or monitor treatment for MRSA infections. Test performance is not FDA approved in patients less than 48 years old. Performed at [facility], [address], [place], [place] [address]   SARS CORONAVIRUS 2 (TAT 6-24 HRS) Nasopharyngeal Nasopharyngeal Swab     Status: None   Collection Time: 03/28/21  4:13 PM   Specimen: Nasopharyngeal Swab  Result Value Ref Range Status   SARS Coronavirus 2 NEGATIVE NEGATIVE Final    Comment: (NOTE) SARS-CoV-2 target nucleic acids are NOT DETECTED.  The SARS-CoV-2 RNA is generally detectable in upper and lower respiratory specimens during the acute phase of infection. Negative results do not preclude SARS-CoV-2 infection, do not rule out co-infections with other pathogens, and should not be used as the sole basis for treatment or other patient management decisions. Negative results must be combined with clinical observations, patient history, and epidemiological information. The expected result is Negative.  Fact Sheet for  Patients: HairSlick.no  Fact Sheet for Healthcare Providers: quierodirigir.com  This test is not yet approved or cleared by the [place] FDA and  has been authorized for detection and/or diagnosis of SARS-CoV-2 by FDA under an Emergency Use Authorization (EUA). This EUA will remain  in effect (meaning this test can be used) for the duration of the COVID-19 declaration under Se ction 564(b)(1) of the Act, 21 U.S.C. section 360bbb-3(b)(1), unless the authorization is terminated or revoked sooner.  Performed at [facility], [address], [place], [place] [address]     Labs: BNP (last 3 results) No results for input(s): BNP in the last 8760 hours. Basic Metabolic Panel: Recent Labs  Lab 03/23/21 0209 03/24/21 0201 03/29/21 0144  NA 136 134* 134*  K 4.7 4.2 4.7  CL 101 101 96*  CO2 28 25 28   GLUCOSE 128* 156* 150*  BUN 26* 32* 23*  CREATININE 1.19 1.09 0.84  CALCIUM 8.7* 8.5* 9.0   Liver Function Tests: No results for input(s): AST, ALT, ALKPHOS, BILITOT, PROT, ALBUMIN in the last 168 hours. No results for input(s): LIPASE, AMYLASE in the last 168 hours. No results for input(s): AMMONIA in the last 168 hours. CBC: Recent Labs  Lab 03/23/21 0209 03/24/21 0201 03/29/21 0144  WBC 12.0* 9.8 6.4  HGB 11.6* 10.6* 12.4*  HCT 36.7* 32.5* 38.1*  MCV 89.7 88.1 87.0  PLT 317 252 251   Cardiac Enzymes: No results for input(s): CKTOTAL, CKMB, CKMBINDEX, TROPONINI in the last 168 hours. BNP: Invalid input(s): POCBNP CBG: Recent Labs  Lab 03/28/21 1131 03/28/21 1620 03/28/21 1954 03/29/21 0103 03/29/21 0602  GLUCAP 186* 196* 205* 132* 216*   D-Dimer No results for input(s): DDIMER in the last 72 hours. Hgb A1c No results for input(s): HGBA1C in the last 72 hours. Lipid Profile No results for input(s): CHOL, HDL, LDLCALC, TRIG, CHOLHDL, LDLDIRECT in the last 72 hours. Thyroid function studies No  results for input(s): TSH, T4TOTAL, T3FREE, THYROIDAB in the last 72 hours.  Invalid input(s): FREET3 Anemia work up No results for input(s): VITAMINB12, FOLATE, FERRITIN, TIBC, IRON, RETICCTPCT in the last 72 hours. Urinalysis    Component Value Date/Time   COLORURINE YELLOW 02/02/2021 2222   APPEARANCEUR HAZY (A) 02/02/2021 2222   LABSPEC 1.024 02/02/2021 2222   PHURINE 5.0 02/02/2021 2222   GLUCOSEU >=500 (A) 02/02/2021 2222   HGBUR MODERATE (A) 02/02/2021 2222   BILIRUBINUR NEGATIVE 02/02/2021 2222   KETONESUR NEGATIVE 02/02/2021 2222   PROTEINUR 30 (A) 02/02/2021 2222   NITRITE NEGATIVE 02/02/2021 2222  LEUKOCYTESUR NEGATIVE 02/02/2021 2222   Sepsis Labs Invalid input(s): PROCALCITONIN,  WBC,  LACTICIDVEN Microbiology Recent Results (from the past 240 hour(s))  MRSA Next Gen by PCR, Nasal     Status: None   Collection Time: 03/20/21  3:33 PM   Specimen: Nasal Mucosa; Nasal Swab  Result Value Ref Range Status   MRSA by PCR Next Gen NOT DETECTED NOT DETECTED Final    Comment: (NOTE) The GeneXpert MRSA Assay (FDA approved for NASAL specimens only), is one component of a comprehensive MRSA colonization surveillance program. It is not intended to diagnose MRSA infection nor to guide or monitor treatment for MRSA infections. Test performance is not FDA approved in patients less than 39 years old. Performed at [facility], [address], [place], [place] [address]   SARS CORONAVIRUS 2 (TAT 6-24 HRS) Nasopharyngeal Nasopharyngeal Swab     Status: None   Collection Time: 03/28/21  4:13 PM   Specimen: Nasopharyngeal Swab  Result Value Ref Range Status   SARS Coronavirus 2 NEGATIVE NEGATIVE Final    Comment: (NOTE) SARS-CoV-2 target nucleic acids are NOT DETECTED.  The SARS-CoV-2 RNA is generally detectable in upper and lower respiratory specimens during the acute phase of infection. Negative results do not preclude SARS-CoV-2 infection, do not rule  out co-infections with other pathogens, and should not be used as the sole basis for treatment or other patient management decisions. Negative results must be combined with clinical observations, patient history, and epidemiological information. The expected result is Negative.  Fact Sheet for Patients: HairSlick.no  Fact Sheet for Healthcare Providers: quierodirigir.com  This test is not yet approved or cleared by the [place] FDA and  has been authorized for detection and/or diagnosis of SARS-CoV-2 by FDA under an Emergency Use Authorization (EUA). This EUA will remain  in effect (meaning this test can be used) for the duration of the COVID-19 declaration under Se ction 564(b)(1) of the Act, 21 U.S.C. section 360bbb-3(b)(1), unless the authorization is terminated or revoked sooner.  Performed at [facility], [address], [place], [place] [address]    Time coordinating discharge: 35 minutes  SIGNED:  Merlene Laughter, DO [facility] 03/29/2021, 10:51 AM Pager is on AMION  If 7PM-7AM, please contact night-coverage [email or web address]

## 2021-03-29 NOTE — Plan of Care (Signed)

## 2021-03-29 NOTE — TOC Transition Note (Signed)
Transition of Care [facility]) - CM/SW Discharge Note   Patient Details  Name: Brett White MRN: [id number] Date of Birth: June 25, 1961  [facility] CM/SW Contact:  Ralene Bathe, LCSWA Phone Number: 03/29/2021, 1:10 PM   Clinical Narrative:    Patient will DC to: [facility] Anticipated DC date: 03/29/2021 Transport by: Sharin Mons   Per MD patient ready for DC to SNF. RN to call report prior to discharge [phone]. RN, patient, and facility notified of DC. Discharge Summary and FL2 sent to facility. DC packet on chart. Ambulance transport will be requested for patient when RN reports that patient is ready..   CSW will sign off for now as social work intervention is no longer needed. Please consult [place] again if new needs arise.     Final next level of care: Skilled Nursing Facility Barriers to Discharge: Barriers Resolved   Patient Goals and CMS Choice   CMS Medicare.gov Compare Post Acute Care list provided to:: Patient    Discharge Placement              Patient chooses bed at: [facility] Patient to be transferred to facility by: [facility] Name of family member notified: Patient notified Patient and family notified of of transfer: 03/29/21  Discharge Plan and Services   Discharge Planning Services: CM Consult                                 Social Determinants of Health (SDOH) Interventions     Readmission Risk Interventions No flowsheet data found.

## 2021-04-05 ENCOUNTER — Encounter: Payer: Self-pay | Admitting: Family

## 2021-04-05 ENCOUNTER — Ambulatory Visit (INDEPENDENT_AMBULATORY_CARE_PROVIDER_SITE_OTHER): Payer: Medicare HMO | Admitting: Family

## 2021-04-05 DIAGNOSIS — Z89512 Acquired absence of left leg below knee: Secondary | ICD-10-CM | POA: Insufficient documentation

## 2021-04-05 NOTE — Progress Notes (Signed)
Post-Op Visit Note   Patient: Brett White           Date of Birth: 1960-10-21           MRN: [id number] Visit Date: 04/05/2021 PCP: Patient, [name]  Chief Complaint:  Chief Complaint  Patient presents with   Left Leg - Routine Post Op    03/22/21 left BKA     HPI:  HPI The patient is a 60 year old gentleman seen status post left below-knee amputation about 2 weeks ago unfortunately he has had 4 falls with direct impact on his left residual limb he states he has not been wearing his limb protector.  He is residing at skilled nursing but has not been taking a walker or wheelchair to the bathroom.  He states he now is using both a walker and a wheelchair.  Ortho Exam On examination of the left residual limb this is well approximated with staples there is some maceration to the lateral third of his incision they have been using Xeroform dressings.  There is no active drainage no surrounding erythema warmth or sign of infection  Visit Diagnoses: No diagnosis found.  Plan: Begin daily Dial soap cleansing.  Dry dressing changes.  He may shower and get his residual limb wet.  Do not submerge.  Follow-up in 2 weeks for staple removal.  Did provide an order for [employer] for prosthesis set up  Follow-Up Instructions: No follow-ups on file.   Imaging: No results found.  Orders:  No orders of the defined types were placed in this encounter.  No orders of the defined types were placed in this encounter.    PMFS History: Patient Active Problem List   Diagnosis Date Noted   Acute osteomyelitis of left foot (HCC) 03/19/2021   Left foot infection 03/17/2021   Pain in left ankle and joints of left foot    Abscess of left foot 03/03/2021   Cutaneous abscess of left foot    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   Uncontrolled type 2 diabetes mellitus with hyperglycemia, without long-term current use of insulin (HCC) 02/03/2021   Essential hypertension  02/03/2021   Lactic acidosis 02/03/2021   Diabetic peripheral neuropathy associated with type 2 diabetes mellitus (HCC) 02/03/2021   Osteomyelitis (HCC) 02/03/2021   Cellulitis of toe of left foot    Past Medical History:  Diagnosis Date   Diabetes mellitus without complication (HCC)     Family History  Problem Relation Age of Onset   Heart attack Neg Hx     Past Surgical History:  Procedure Laterality Date   AMPUTATION Left 02/04/2021   Procedure: LEFT GREAT TOE AMPUTATION;  Surgeon: [name], MD;  Location: [facility] OR;  Service: Orthopedics;  Laterality: Left;   AMPUTATION Left 02/10/2021   Procedure: LEFT FOOT 1ST RAY AMPUTATION;  Surgeon: [name], MD;  Location: [facility] OR;  Service: Orthopedics;  Laterality: Left;   AMPUTATION Left 03/22/2021   Procedure: LEFT BELOW KNEE AMPUTATION;  Surgeon: [name], MD;  Location: [facility];  Service: Orthopedics;  Laterality: Left;   APPENDECTOMY     BACK SURGERY     I & D EXTREMITY Left 03/03/2021   Procedure: LEFT FOOT DEBRIDEMENT;  Surgeon: [name], MD;  Location: [facility] OR;  Service: Orthopedics;  Laterality: Left;   TONSILLECTOMY     Social History   Occupational History   Not on file  Tobacco Use   Smoking status:  Never   Smokeless tobacco: Never  Substance and Sexual Activity   Alcohol use: Not Currently   Drug use: Not Currently   Sexual activity: Not on file

## 2021-04-19 ENCOUNTER — Encounter: Payer: Self-pay | Admitting: Family

## 2021-04-19 ENCOUNTER — Other Ambulatory Visit: Payer: Self-pay

## 2021-04-19 ENCOUNTER — Ambulatory Visit (INDEPENDENT_AMBULATORY_CARE_PROVIDER_SITE_OTHER): Payer: Medicare HMO | Admitting: Family

## 2021-04-19 DIAGNOSIS — Z89512 Acquired absence of left leg below knee: Secondary | ICD-10-CM

## 2021-04-19 NOTE — Progress Notes (Signed)
   Post-Op Visit Note   Patient: Brett White           Date of Birth: 07/26/1961           MRN: [id number] Visit Date: 04/19/2021 PCP: Patient, [name]  Chief Complaint: No chief complaint on file.   HPI:  HPI The patient is a 60 year old gentleman seen 3 weeks status post left below-knee amputation he is residing at skilled nursing Ortho Exam On examination of the left below the knee amputation the incision is well-healed medially along the lateral aspect this has not yet fully healed however there is no gaping drainage no erythema no sign of infection we will hold off on staple removal for 1 more week  Visit Diagnoses: No diagnosis found.  Plan: Continue daily Dial soap cleansing.  Dry dressings.  Discussed calling [employer] to get set up with an appointment as well as a shrinker.  He is to wear this once he is obtained at around-the-clock  Follow-Up Instructions: No follow-ups on file.   Imaging: No results found.  Orders:  No orders of the defined types were placed in this encounter.  No orders of the defined types were placed in this encounter.    PMFS History: Patient Active Problem List   Diagnosis Date Noted   Acquired absence of left leg below knee (HCC) 04/05/2021   Acute osteomyelitis of left foot (HCC) 03/19/2021   Left foot infection 03/17/2021   Pain in left ankle and joints of left foot    Abscess of left foot 03/03/2021   Cutaneous abscess of left foot    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   Uncontrolled type 2 diabetes mellitus with hyperglycemia, without long-term current use of insulin (HCC) 02/03/2021   Essential hypertension 02/03/2021   Lactic acidosis 02/03/2021   Diabetic peripheral neuropathy associated with type 2 diabetes mellitus (HCC) 02/03/2021   Osteomyelitis (HCC) 02/03/2021   Cellulitis of toe of left foot    Past Medical History:  Diagnosis Date   Diabetes mellitus without complication (HCC)      Family History  Problem Relation Age of Onset   Heart attack Neg Hx     Past Surgical History:  Procedure Laterality Date   AMPUTATION Left 02/04/2021   Procedure: LEFT GREAT TOE AMPUTATION;  Surgeon: [name], MD;  Location: [facility] OR;  Service: Orthopedics;  Laterality: Left;   AMPUTATION Left 02/10/2021   Procedure: LEFT FOOT 1ST RAY AMPUTATION;  Surgeon: [name], MD;  Location: [facility] OR;  Service: Orthopedics;  Laterality: Left;   AMPUTATION Left 03/22/2021   Procedure: LEFT BELOW KNEE AMPUTATION;  Surgeon: [name], MD;  Location: [facility] OR;  Service: Orthopedics;  Laterality: Left;   APPENDECTOMY     BACK SURGERY     I & D EXTREMITY Left 03/03/2021   Procedure: LEFT FOOT DEBRIDEMENT;  Surgeon: [name], MD;  Location: [facility] OR;  Service: Orthopedics;  Laterality: Left;   TONSILLECTOMY     Social History   Occupational History   Not on file  Tobacco Use   Smoking status: Never   Smokeless tobacco: Never  Substance and Sexual Activity   Alcohol use: Not Currently   Drug use: Not Currently   Sexual activity: Not on file

## 2021-05-01 ENCOUNTER — Ambulatory Visit (INDEPENDENT_AMBULATORY_CARE_PROVIDER_SITE_OTHER): Payer: Medicare HMO | Admitting: Physician Assistant

## 2021-05-01 ENCOUNTER — Encounter: Payer: Self-pay | Admitting: Orthopedic Surgery

## 2021-05-01 ENCOUNTER — Other Ambulatory Visit: Payer: Self-pay | Admitting: Physician Assistant

## 2021-05-01 ENCOUNTER — Other Ambulatory Visit: Payer: Self-pay

## 2021-05-01 VITALS — Ht 74.0 in | Wt 215.0 lb

## 2021-05-01 DIAGNOSIS — Z89512 Acquired absence of left leg below knee: Secondary | ICD-10-CM

## 2021-05-01 NOTE — Progress Notes (Signed)
Office Visit Note   Patient: Brett White           Date of Birth: 01-10-1961           MRN: [id number] Visit Date: 05/01/2021              Requested by: No referring provider defined for this encounter. PCP: Patient, [name]  Chief Complaint  Patient presents with   Left Leg - Follow-up    03/22/2021 Left BKA      HPI: Patient is 6 weeks status post left below-knee amputation.  He is currently residing at [facility].  Has no complaints except for some numbness in his right foot.  Assessment & Plan: Visit Diagnoses: No diagnosis found.  Plan: Prescription was provided for shrinkers as well as a prosthetic with [employer].  We will follow-up with [place] in 1 month.  Follow-Up Instructions: No follow-ups on file.   Ortho Exam  Patient is alert, oriented, no adenopathy, well-dressed, normal affect, normal respiratory effort. Left lower extremity below-knee amputation incision is healed swelling is overall well controlled he does not have any shrinkers.  No cellulitis or signs of infection Right foot he has a palpable dorsalis pedis and posterior tibial tendon pulse nose necrosis brisk capillary refill foot is warm findings most consistent with neuropathy Patient is a new left transtibial  amputee.  Patient's current comorbidities are not expected to impact the ability to function with the prescribed prosthesis. Patient verbally communicates a strong desire to use a prosthesis. Patient currently requires mobility aids to ambulate without a prosthesis.  Expects not to use mobility aids with a new prosthesis.  Patient is a K3 level ambulator that spends a lot of time walking around on uneven terrain over obstacles, up and down stairs, and ambulates with a variable cadence.    Imaging: No results found.   Labs: Lab Results  Component Value Date   HGBA1C 11.5 (H) 02/03/2021   ESRSEDRATE 34 (H) 03/17/2021   CRP <0.5 03/17/2021   CRP 1.0 (H) 02/08/2021   CRP 0.8  02/05/2021   REPTSTATUS 03/23/2021 FINAL 03/17/2021   GRAMSTAIN  03/03/2021    RARE WBC PRESENT, PREDOMINANTLY MONONUCLEAR NO ORGANISMS SEEN    CULT  03/17/2021    NO GROWTH 5 DAYS Performed at [facility], [address], [place], [place] [address]    LABORGA METHICILLIN RESISTANT STAPHYLOCOCCUS AUREUS 03/03/2021     Lab Results  Component Value Date   ALBUMIN 3.8 03/16/2021   ALBUMIN 2.8 (L) 02/21/2021   ALBUMIN 3.5 02/03/2021    Lab Results  Component Value Date   MG 1.8 03/17/2021   MG 1.9 02/08/2021   MG 1.9 02/05/2021   No results found for: VD25OH  No results found for: PREALBUMIN CBC EXTENDED Latest Ref Rng & Units 03/29/2021 03/24/2021 03/23/2021  WBC 4.0 - 10.5 K/uL 6.4 9.8 12.0(H)  RBC 4.22 - 5.81 MIL/uL 4.38 3.69(L) 4.09(L)  HGB 13.0 - 17.0 g/dL 12.4(L) 10.6(L) 11.6(L)  HCT 39.0 - 52.0 % 38.1(L) 32.5(L) 36.7(L)  PLT 150 - 400 K/uL 251 252 317  NEUTROABS 1.7 - 7.7 K/uL - - -  LYMPHSABS 0.7 - 4.0 K/uL - - -     Body mass index is 27.6 kg/m.  Orders:  No orders of the defined types were placed in this encounter.  No orders of the defined types were placed in this encounter.    Procedures: No procedures performed  Clinical Data: No additional findings.  ROS:  All other systems negative, except as noted in the HPI. Review of Systems  Objective: Vital Signs: Ht 6\' 2"  (1.88 m)   Wt 215 lb (97.5 kg)   BMI 27.60 kg/m   Specialty Comments:  No specialty comments available.  PMFS History: Patient Active Problem List   Diagnosis Date Noted   Acquired absence of left leg below knee (HCC) 04/05/2021   Acute osteomyelitis of left foot (HCC) 03/19/2021   Left foot infection 03/17/2021   Pain in left ankle and joints of left foot    Abscess of left foot 03/03/2021   Cutaneous abscess of left foot    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   Uncontrolled type 2 diabetes mellitus with hyperglycemia, without long-term  current use of insulin (HCC) 02/03/2021   Essential hypertension 02/03/2021   Lactic acidosis 02/03/2021   Diabetic peripheral neuropathy associated with type 2 diabetes mellitus (HCC) 02/03/2021   Osteomyelitis (HCC) 02/03/2021   Cellulitis of toe of left foot    Past Medical History:  Diagnosis Date   Diabetes mellitus without complication (HCC)     Family History  Problem Relation Age of Onset   Heart attack Neg Hx     Past Surgical History:  Procedure Laterality Date   AMPUTATION Left 02/04/2021   Procedure: LEFT GREAT TOE AMPUTATION;  Surgeon: 04/06/2021, MD;  Location: [facility] OR;  Service: Orthopedics;  Laterality: Left;   AMPUTATION Left 02/10/2021   Procedure: LEFT FOOT 1ST RAY AMPUTATION;  Surgeon: 02/12/2021, MD;  Location: [facility] OR;  Service: Orthopedics;  Laterality: Left;   AMPUTATION Left 03/22/2021   Procedure: LEFT BELOW KNEE AMPUTATION;  Surgeon: 03/24/2021, MD;  Location: [facility] OR;  Service: Orthopedics;  Laterality: Left;   APPENDECTOMY     BACK SURGERY     I & D EXTREMITY Left 03/03/2021   Procedure: LEFT FOOT DEBRIDEMENT;  Surgeon: 05/03/2021, MD;  Location: [facility] OR;  Service: Orthopedics;  Laterality: Left;   TONSILLECTOMY     Social History   Occupational History   Not on file  Tobacco Use   Smoking status: Never   Smokeless tobacco: Never  Substance and Sexual Activity   Alcohol use: Not Currently   Drug use: Not Currently   Sexual activity: Not on file

## 2021-05-09 ENCOUNTER — Emergency Department (HOSPITAL_COMMUNITY): Payer: Medicare HMO

## 2021-05-09 ENCOUNTER — Other Ambulatory Visit: Payer: Self-pay

## 2021-05-09 ENCOUNTER — Encounter (HOSPITAL_COMMUNITY): Payer: Self-pay

## 2021-05-09 ENCOUNTER — Emergency Department (HOSPITAL_COMMUNITY)
Admission: EM | Admit: 2021-05-09 | Discharge: 2021-05-10 | Discharge status: Against Medical Advice | Payer: Medicare HMO | Attending: Emergency Medicine | Admitting: Emergency Medicine

## 2021-05-09 DIAGNOSIS — Z794 Long term (current) use of insulin: Secondary | ICD-10-CM | POA: Diagnosis not present

## 2021-05-09 DIAGNOSIS — T8744 Infection of amputation stump, left lower extremity: Secondary | ICD-10-CM | POA: Diagnosis not present

## 2021-05-09 DIAGNOSIS — E119 Type 2 diabetes mellitus without complications: Secondary | ICD-10-CM | POA: Insufficient documentation

## 2021-05-09 DIAGNOSIS — Y793 Surgical instruments, materials and orthopedic devices (including sutures) associated with adverse incidents: Secondary | ICD-10-CM | POA: Insufficient documentation

## 2021-05-09 DIAGNOSIS — Z20822 Contact with and (suspected) exposure to covid-19: Secondary | ICD-10-CM | POA: Insufficient documentation

## 2021-05-09 DIAGNOSIS — M79605 Pain in left leg: Secondary | ICD-10-CM | POA: Diagnosis present

## 2021-05-09 DIAGNOSIS — I10 Essential (primary) hypertension: Secondary | ICD-10-CM | POA: Diagnosis not present

## 2021-05-09 IMAGING — CR DG KNEE 1-2V*L*
2 series · 2 of 2 positions shown · non-contrast
Comparison: None.

CLINICAL DATA: Fall 3 days ago with stump pain.

EXAM:
LEFT KNEE - 1-2 VIEW

[knee ap]
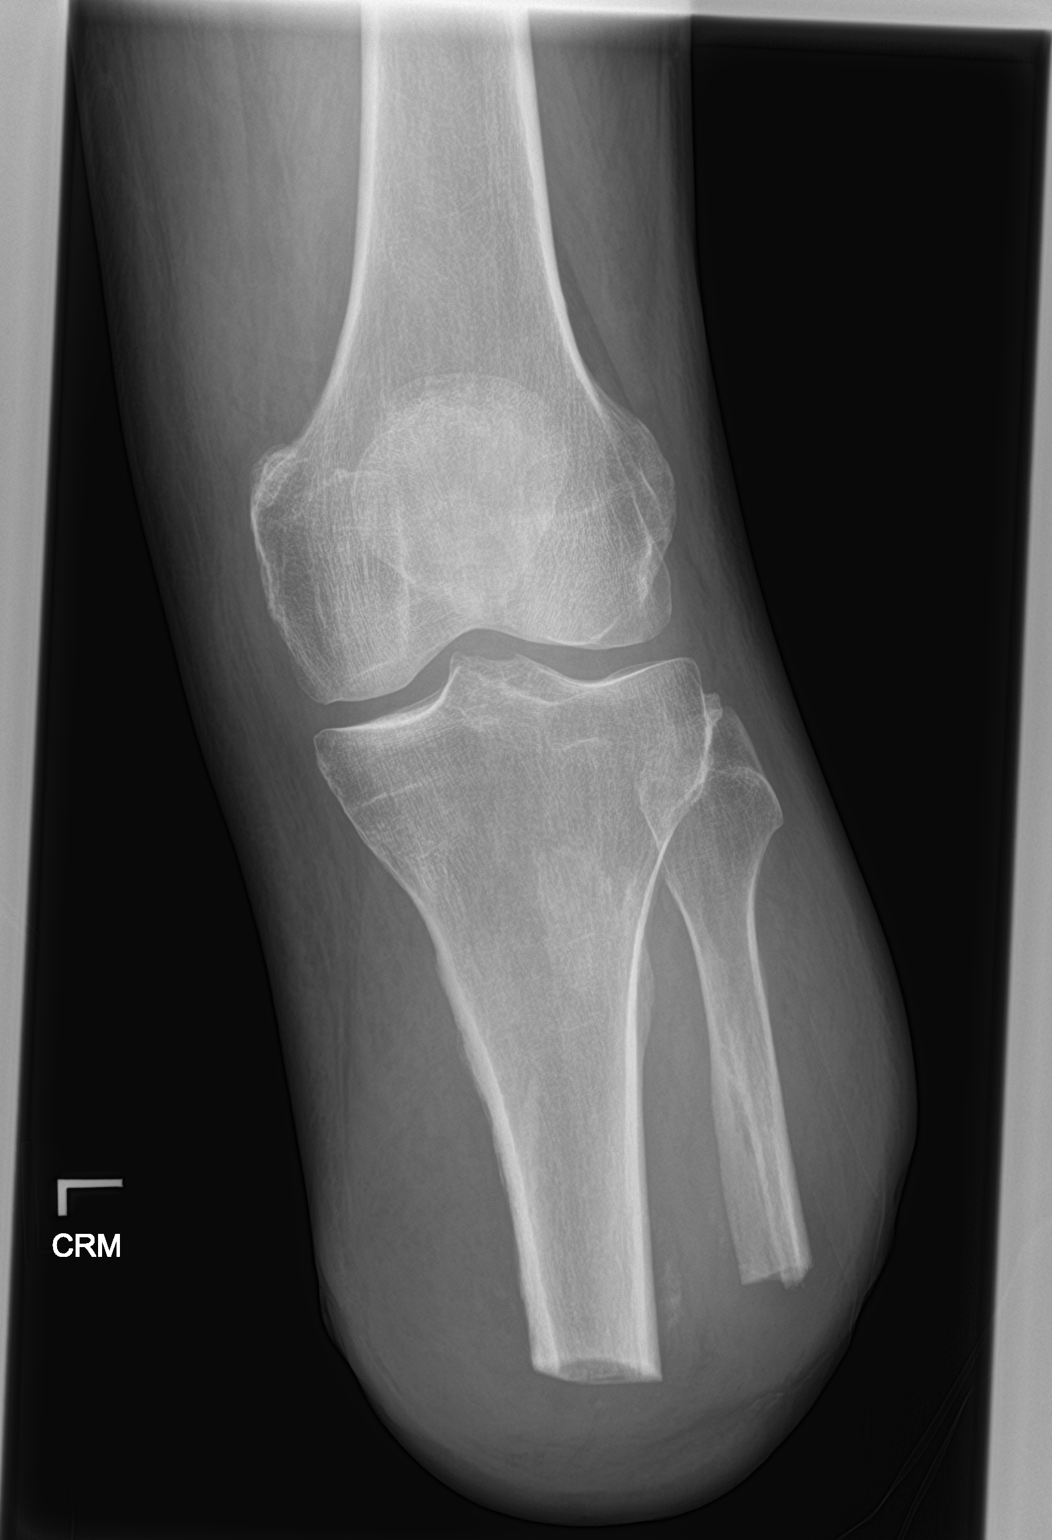

[knee lat]
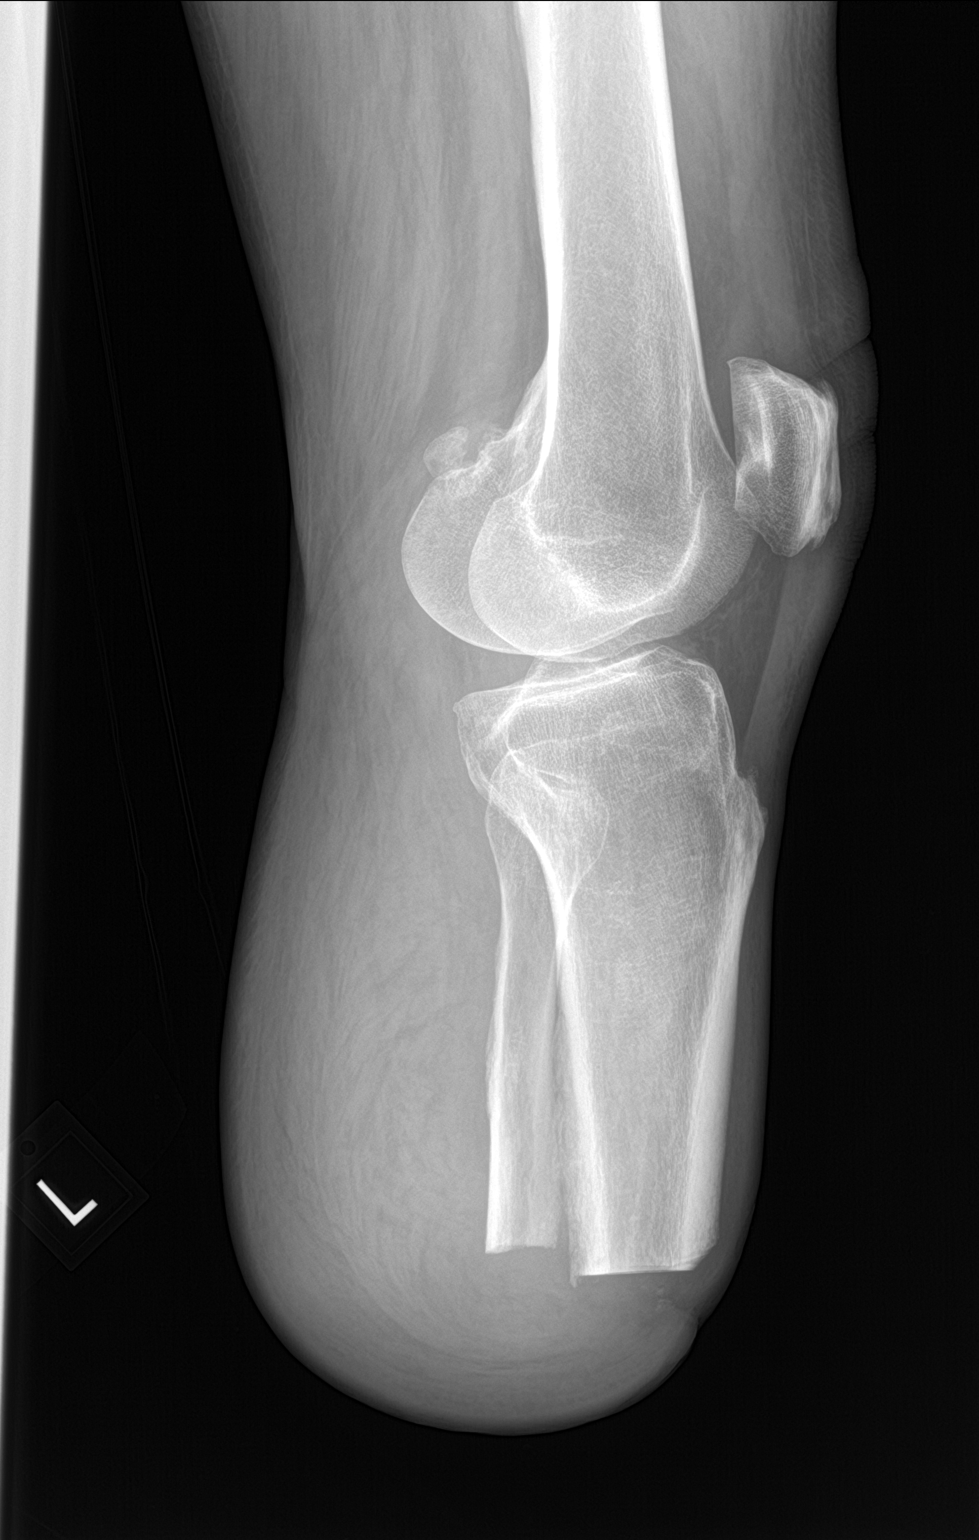

[2 of 2 positions shown; findings below may reference images not displayed]

FINDINGS: No acute fracture. Below the knee amputation with smooth resection
margins of the tibia and fibula. Normal knee alignment. Minimal
patellofemoral spurring. Well corticated density posterior to the
medial femoral condyle is chronic, may represent sequela of remote
injury. No significant joint effusion. Small quadriceps and patellar
tendon enthesophytes. No focal soft tissue abnormality.
IMPRESSION: 1. No acute fracture or dislocation of the left knee.
2. Below the knee amputation with smooth resection margins of the
tibia and fibula.

## 2021-05-09 NOTE — ED Triage Notes (Signed)
Pt reports recent L leg BKA and fell three days ago onto concrete. Pt reports L leg swelling and pain since. Denies head injury/LOC.

## 2021-05-09 NOTE — ED Provider Notes (Signed)
Emergency Medicine Provider Triage Evaluation Note  Rhylen Shaheen , a 60 y.o. male  was evaluated in triage.  Pt complains of left leg BKA around 6 weeks ago, Had a fall on concrete 3 days ago worsening pain, worsening redness after the fall.  Currently taking hydrocodone fives without much improvement.  Review of Systems  Positive: Leg pain Negative: fever  Physical Exam  Ht 6\' 2"  (1.88 m)   Wt 95.3 kg   BMI 26.96 kg/m  Gen:   Awake, no distress   Resp:  Normal effort  MSK:   Moves extremities without difficulty  Other:  LEFT BKA  Medical Decision Making  Medically screening exam initiated at 5:33 PM.  Appropriate orders placed.  Amer Alcindor was informed that the remainder of the evaluation will be completed by another provider, this initial triage assessment does not replace that evaluation, and the importance of remaining in the ED until their evaluation is complete.     Jake Church, PA-C 05/09/21 1735    07/09/21, MD 05/16/21 [id number]

## 2021-05-10 LAB — CBC WITH DIFFERENTIAL/PLATELET
Abs Immature Granulocytes: 0.02 10*3/uL (ref 0.00–0.07)
Basophils Absolute: 0 10*3/uL (ref 0.0–0.1)
Basophils Relative: 1 %
Eosinophils Absolute: 0.2 10*3/uL (ref 0.0–0.5)
Eosinophils Relative: 4 %
HCT: 43.5 % (ref 39.0–52.0)
Hemoglobin: 14.3 g/dL (ref 13.0–17.0)
Immature Granulocytes: 0 %
Lymphocytes Relative: 32 %
Lymphs Abs: 2 10*3/uL (ref 0.7–4.0)
MCH: 27.7 pg (ref 26.0–34.0)
MCHC: 32.9 g/dL (ref 30.0–36.0)
MCV: 84.3 fL (ref 80.0–100.0)
Monocytes Absolute: 0.4 10*3/uL (ref 0.1–1.0)
Monocytes Relative: 6 %
Neutro Abs: 3.5 10*3/uL (ref 1.7–7.7)
Neutrophils Relative %: 57 %
Platelets: 356 10*3/uL (ref 150–400)
RBC: 5.16 MIL/uL (ref 4.22–5.81)
RDW: 13.2 % (ref 11.5–15.5)
WBC: 6.1 10*3/uL (ref 4.0–10.5)
nRBC: 0 % (ref 0.0–0.2)

## 2021-05-10 LAB — RESP PANEL BY RT-PCR (FLU A&B, COVID) ARPGX2
Influenza A by PCR: NEGATIVE
Influenza B by PCR: NEGATIVE
SARS Coronavirus 2 by RT PCR: NEGATIVE

## 2021-05-10 LAB — BASIC METABOLIC PANEL
Anion gap: 13 (ref 5–15)
BUN: 24 mg/dL — ABNORMAL HIGH (ref 6–20)
CO2: 26 mmol/L (ref 22–32)
Calcium: 9.6 mg/dL (ref 8.9–10.3)
Chloride: 96 mmol/L — ABNORMAL LOW (ref 98–111)
Creatinine, Ser: 1.12 mg/dL (ref 0.61–1.24)
GFR, Estimated: >60 mL/min (ref >60)
Glucose, Bld: 342 mg/dL — ABNORMAL HIGH (ref 70–99)
Potassium: 4.2 mmol/L (ref 3.5–5.1)
Sodium: 135 mmol/L (ref 135–145)

## 2021-05-10 LAB — LACTIC ACID, PLASMA: Lactic Acid, Venous: 2.6 mmol/L (ref 0.5–1.9)

## 2021-05-10 MED ORDER — OXYCODONE-ACETAMINOPHEN 5-325 MG PO TABS
2.0000 | ORAL_TABLET | Freq: Once | ORAL | Status: AC
Start: 2021-05-10 — End: 2021-05-10
  Administered 2021-05-10: 2 via ORAL
  Filled 2021-05-10: qty 2

## 2021-05-10 MED ORDER — DEXTROSE 5 % IV SOLN
1500.0000 mg | Freq: Once | INTRAVENOUS | Status: AC
  Administered 2021-05-10: 1500 mg via INTRAVENOUS
  Filled 2021-05-10: qty 75

## 2021-05-10 MED ORDER — MORPHINE SULFATE (PF) 4 MG/ML IV SOLN
4.0000 mg | Freq: Once | INTRAVENOUS | Status: AC
  Administered 2021-05-10: 4 mg via INTRAVENOUS
  Filled 2021-05-10: qty 1

## 2021-05-10 MED ORDER — SODIUM CHLORIDE 0.9 % IV BOLUS
1000.0000 mL | Freq: Once | INTRAVENOUS | Status: AC
  Administered 2021-05-10: 1000 mL via INTRAVENOUS

## 2021-05-10 MED ORDER — KETOROLAC TROMETHAMINE 30 MG/ML IJ SOLN
30.0000 mg | Freq: Once | INTRAMUSCULAR | Status: AC
  Administered 2021-05-10: 30 mg via INTRAVENOUS
  Filled 2021-05-10: qty 1

## 2021-05-10 MED ORDER — GABAPENTIN 300 MG PO CAPS: 300.0000 mg | ORAL_CAPSULE | Freq: Two times a day (BID) | ORAL | 0 refills | Status: DC

## 2021-05-10 MED ORDER — ORPHENADRINE CITRATE 30 MG/ML IJ SOLN: 60.0000 mg | Freq: Two times a day (BID) | INTRAMUSCULAR | Status: DC

## 2021-05-10 MED ORDER — OXYCODONE HCL 5 MG PO TABS
10.0000 mg | ORAL_TABLET | ORAL | Status: DC | PRN
  Administered 2021-05-10: 10 mg via ORAL
  Filled 2021-05-10: qty 2

## 2021-05-10 MED ORDER — GABAPENTIN 300 MG PO CAPS
300.0000 mg | ORAL_CAPSULE | Freq: Once | ORAL | Status: AC
  Administered 2021-05-10: 300 mg via ORAL
  Filled 2021-05-10: qty 1

## 2021-05-10 MED ORDER — METHOCARBAMOL 500 MG PO TABS
500.0000 mg | ORAL_TABLET | Freq: Once | ORAL | Status: AC
  Administered 2021-05-10: 500 mg via ORAL
  Filled 2021-05-10: qty 1

## 2021-05-10 MED ORDER — MORPHINE SULFATE (PF) 4 MG/ML IV SOLN: 4.0000 mg | Freq: Once | INTRAVENOUS | Status: DC

## 2021-05-10 MED ORDER — OXYCODONE-ACETAMINOPHEN 5-325 MG PO TABS
1.0000 | ORAL_TABLET | Freq: Once | ORAL | Status: AC
  Administered 2021-05-10: 1 via ORAL
  Filled 2021-05-10: qty 1

## 2021-05-10 NOTE — ED Notes (Signed)
Patient denies pain and is resting comfortably.  

## 2021-05-10 NOTE — ED Notes (Signed)
Report given to Crystal B, RN   

## 2021-05-10 NOTE — ED Notes (Signed)
Patient Alert and oriented to baseline. Stable and ambulatory to baseline. Patient verbalized understanding of the discharge instructions.  Patient belongings were taken by the patient.   

## 2021-05-10 NOTE — ED Notes (Signed)
[facility] called to transport patient  

## 2021-05-10 NOTE — ED Provider Notes (Signed)
[facility] EMERGENCY DEPARTMENT Provider Note   CSN: [id number] Arrival date & time: 05/09/21  1713     History Chief Complaint  Patient presents with   Leg Pain    Brett White is a 60 y.o. male with a history of osteomyelitis s/p left BKA, diabetes mellitus type 2, diabetic neuropathy, HTN, anxiety, GERD who presents to the emergency department with a chief complaint of left stump pain.  The patient reports that he just got out of [facility] for rehab 5 days ago.  Approximately 3 days ago, he fell from standing and fell onto his left stump.  He denies hitting his head.  No syncope, nausea, vomiting, or headache.  He was able to stand and has been ambulatory since his fall.  However, since the fall, he has had constant, worsening pain in the left stump with worsening redness noted around the surgical incision as well as increased bloody drainage.  Yesterday, he was febrile to 101 and endorses chills.  He denies red streaking, chest pain, shortness of breath, abdominal pain, rash, back pain, neck pain, numbness or weakness.  He has been taking his home pain medication with no improvement in his symptoms.  The history is provided by the patient and medical records. No language interpreter was used.      Past Medical History:  Diagnosis Date   Diabetes mellitus without complication [facility])     Patient Active Problem List   Diagnosis Date Noted   Acquired absence of left leg below knee (HCC) 04/05/2021   Acute osteomyelitis of left foot (HCC) 03/19/2021   Left foot infection 03/17/2021   Pain in left ankle and joints of left foot    Abscess of left foot 03/03/2021   Cutaneous abscess of left foot    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   Uncontrolled type 2 diabetes mellitus with hyperglycemia, without long-term current use of insulin (HCC) 02/03/2021   Essential hypertension 02/03/2021   Lactic acidosis 02/03/2021   Diabetic peripheral  neuropathy associated with type 2 diabetes mellitus (HCC) 02/03/2021   Osteomyelitis (HCC) 02/03/2021   Cellulitis of toe of left foot     Past Surgical History:  Procedure Laterality Date   AMPUTATION Left 02/04/2021   Procedure: LEFT GREAT TOE AMPUTATION;  Surgeon: [name], MD;  Location: [facility] OR;  Service: Orthopedics;  Laterality: Left;   AMPUTATION Left 02/10/2021   Procedure: LEFT FOOT 1ST RAY AMPUTATION;  Surgeon: [name], MD;  Location: [facility] OR;  Service: Orthopedics;  Laterality: Left;   AMPUTATION Left 03/22/2021   Procedure: LEFT BELOW KNEE AMPUTATION;  Surgeon: [name], MD;  Location: [facility] OR;  Service: Orthopedics;  Laterality: Left;   APPENDECTOMY     BACK SURGERY     I & D EXTREMITY Left 03/03/2021   Procedure: LEFT FOOT DEBRIDEMENT;  Surgeon: [name], MD;  Location: [facility] OR;  Service: Orthopedics;  Laterality: Left;   TONSILLECTOMY         Family History  Problem Relation Age of Onset   Heart attack Neg Hx     Social History   Tobacco Use   Smoking status: Never   Smokeless tobacco: Never  Substance Use Topics   Alcohol use: Not Currently   Drug use: Not Currently    Home Medications Prior to Admission medications   Medication Sig Start Date End Date Taking? Authorizing Provider  acetaminophen (TYLENOL) 325 MG tablet Take 1-2 tablets (325-650 mg  total) by mouth every 6 (six) hours as needed for mild pain (pain score 1-3 or temp > 100.5). 03/29/21   Marguerita MerlesSheikh, Omair Latif, DO  ascorbic acid (VITAMIN C) 1000 MG tablet Take 1 tablet (1,000 mg total) by mouth daily. 03/30/21   Marguerita MerlesSheikh, Omair Latif, DO  Calcium Carbonate Antacid (TUMS PO) Take 1 tablet by mouth 3 (three) times daily with meals.    [provider]  DULoxetine (CYMBALTA) 30 MG capsule Take 30 mg by mouth daily.    [provider]  gabapentin (NEURONTIN) 300 MG capsule Take 2 capsules (600 mg total) by mouth 3 (three) times daily. Patient taking differently: Take 900 mg  by mouth 3 (three) times daily. 02/17/21   Lanae BoastKc, Ramesh, MD  guaiFENesin-dextromethorphan (ROBITUSSIN DM) 100-10 MG/5ML syrup Take 15 mLs by mouth every 4 (four) hours as needed for cough. 03/29/21   Marguerita MerlesSheikh, Omair Latif, DO  insulin glargine (LANTUS) 100 UNIT/ML injection Inject 0.05 mLs (5 Units total) into the skin 2 (two) times daily. 03/29/21   Marguerita MerlesSheikh, Omair Latif, DO  losartan (COZAAR) 25 MG tablet Take 1 tablet (25 mg total) by mouth daily. 03/30/21   Marguerita MerlesSheikh, Omair Latif, DO  methocarbamol (ROBAXIN) 500 MG tablet Take 1 tablet (500 mg total) by mouth every 6 (six) hours as needed for muscle spasms. 02/17/21   Lanae BoastKc, Ramesh, MD  Multiple Vitamin (MULTIVITAMIN WITH MINERALS) TABS tablet Take 1 tablet by mouth daily.    [provider]  NOVOLOG FLEXPEN 100 UNIT/ML FlexPen Inject 5 Units into the skin with breakfast, with lunch, and with evening meal. 02/19/21   [provider]  nutrition supplement, JUVEN, (JUVEN) PACK Take 1 packet by mouth 2 (two) times daily between meals. 03/29/21   Marguerita MerlesSheikh, Omair Latif, DO  ondansetron (ZOFRAN ODT) 4 MG disintegrating tablet 4mg  ODT q4 hours prn nausea/vomit Patient taking differently: Take 4 mg by mouth every 4 (four) hours as needed for vomiting or nausea. 02/21/21   Charlynne PanderYao, David Hsienta, MD  oxyCODONE 10 MG TABS Take 1-1.5 tablets (10-15 mg total) by mouth every 4 (four) hours as needed for severe pain (pain score 7-10). 03/27/21   Persons, [place] BaliMary Anne, PA  pantoprazole (PROTONIX) 40 MG tablet Take 1 tablet (40 mg total) by mouth daily. 02/18/21   Lanae BoastKc, Ramesh, MD  phenol (CHLORASEPTIC) 1.4 % LIQD Use as directed 1 spray in the mouth or throat as needed for throat irritation / pain. 03/29/21   Marguerita MerlesSheikh, Omair Latif, DO  polyethylene glycol (MIRALAX / GLYCOLAX) 17 g packet Take 17 g by mouth 2 (two) times daily. 03/29/21   Sheikh, Omair Latif, DO  senna-docusate (SENOKOT-S) 8.6-50 MG tablet Take 2 tablets by mouth at bedtime. 03/29/21   Marguerita MerlesSheikh, Omair Latif, DO   silver sulfADIAZINE (SILVADENE) 1 % cream Apply topically daily. 03/30/21   Marguerita MerlesSheikh, Omair Latif, DO  zinc sulfate 220 (50 Zn) MG capsule Take 1 capsule (220 mg total) by mouth daily. 02/18/21   Lanae BoastKc, Ramesh, MD    Allergies    Patient has no known allergies.  Review of Systems   Review of Systems  Constitutional:  Positive for chills and fever. Negative for appetite change and diaphoresis.  HENT:  Negative for congestion and sore throat.   Respiratory:  Negative for shortness of breath.   Cardiovascular:  Negative for chest pain.  Gastrointestinal:  Negative for abdominal pain, constipation, diarrhea, nausea and vomiting.  Genitourinary:  Negative for dysuria, frequency, penile discharge, penile pain and urgency.  Musculoskeletal:  Positive for arthralgias and myalgias. Negative for back pain and neck stiffness.  Skin:  Positive for color change and wound. Negative for rash.  Allergic/Immunologic: Negative for immunocompromised state.  Neurological:  Negative for dizziness, seizures, syncope, weakness, numbness and headaches.  Psychiatric/Behavioral:  Negative for confusion.    Physical Exam Updated Vital Signs BP (!) 191/98   Pulse 91   Temp 98.7 F (37.1 C) (Oral)   Resp 20   Ht 6\' 2"  (1.88 m)   Wt 95.3 kg   SpO2 100%   BMI 26.96 kg/m   Physical Exam Vitals and nursing note reviewed.  Constitutional:      Appearance: He is well-developed.     Comments: Uncomfortable appearing  HENT:     Head: Normocephalic.  Eyes:     Conjunctiva/sclera: Conjunctivae normal.  Cardiovascular:     Rate and Rhythm: Normal rate and regular rhythm.     Heart sounds: No murmur heard. Pulmonary:     Effort: Pulmonary effort is normal. No respiratory distress.     Breath sounds: No stridor. No wheezing, rhonchi or rales.  Chest:     Chest wall: No tenderness.  Abdominal:     General: There is no distension.     Palpations: Abdomen is soft. There is no mass.     Tenderness: There is no  abdominal tenderness. There is no guarding or rebound.     Hernia: No hernia is present.  Musculoskeletal:     Cervical back: Neck supple.     Comments: Erythema, mild swelling, and warmth noted around the surgical incision of the left stump.  Mild bogginess.  Diffusely tender to palpation to the left stump.  No obvious drainage or drainage able to be expressed.  No wound dehiscence.  Skin:    General: Skin is warm and dry.  Neurological:     Mental Status: He is alert.  Psychiatric:        Behavior: Behavior normal.        ED Results / Procedures / Treatments   Labs (all labs ordered are listed, but only abnormal results are displayed) Labs Reviewed  CULTURE, BLOOD (ROUTINE X 2)  CULTURE, BLOOD (ROUTINE X 2)  RESP PANEL BY RT-PCR (FLU A&B, COVID) ARPGX2  CBC WITH DIFFERENTIAL/PLATELET  BASIC METABOLIC PANEL  LACTIC ACID, PLASMA  LACTIC ACID, PLASMA    EKG None  Radiology DG Knee 2 Views Left  Result Date: 05/09/2021 CLINICAL DATA:  Fall 3 days ago with stump pain. EXAM: LEFT KNEE - 1-2 VIEW COMPARISON:  None. FINDINGS: No acute fracture. Below the knee amputation with smooth resection margins of the tibia and fibula. Normal knee alignment. Minimal patellofemoral spurring. Well corticated density posterior to the medial femoral condyle is chronic, may represent sequela of remote injury. No significant joint effusion. Small quadriceps and patellar tendon enthesophytes. No focal soft tissue abnormality. IMPRESSION: 1. No acute fracture or dislocation of the left knee. 2. Below the knee amputation with smooth resection margins of the tibia and fibula. Electronically Signed   By: 07/09/2021 M.D.   On: 05/09/2021 18:23    Procedures Procedures   Medications Ordered in ED Medications  morphine 4 MG/ML injection 4 mg (has no administration in time range)  oxyCODONE-acetaminophen (PERCOCET/ROXICET) 5-325 MG per tablet 1 tablet (1 tablet Oral Given 05/10/21 0145)    ED  Course  I have reviewed the triage vital signs and the nursing notes.  Pertinent labs & imaging results that were available during  my care of the patient were reviewed by me and considered in my medical decision making (see chart for details).    MDM Rules/Calculators/A&P                           60 year old male with a history of osteomyelitis s/p left BKA, diabetes mellitus type 2, diabetic neuropathy, HTN, anxiety, GERD who presents with a 3-day history of worsening pain, swelling, and redness in his left stump that began after a fall.  He also reports increased sanguinous drainage.  He had a fever to 101 yesterday and endorses chills.  Afebrile in the ED.  However, oral temp 99.1 on arrival with mild tachycardia.  He is hypertensive, but vital signs are otherwise unremarkable.  Labs and imaging of been reviewed and independently interpreted by me.  X-ray with full visualization of the left stump with no bony destruction, fracture, or other acute findings.  No leukocytosis.  Initial lactate is elevated to 2.6, concerning for infection given reported history of recent fever.  Treated with IV fluids in the ED.  Blood cultures have been sent given history of fever.  Patient's pain has been well controlled with morphine, Toradol, Robaxin, and gabapentin.  He is followed by pain management and has home pain medication.  We will also restart the patient on gabapentin.  Given concern for infection, he was given a dose of [place] in the ED after discussing the patient with pharmacy.  The patient has also been discussed and independently evaluated by Dr. Blinda Leatherwood, attending physician, who is in agreement with the work-up and plan.  The patient is following with Dr. Lajoyce Corners for recent BKA.  Epic message has been sent to Dr. Lajoyce Corners to help coordinate follow-up.  Doubt worsening osteomyelitis, sepsis, occult fracture, traumatic hematoma.  At this time, the patient is appropriate for discharge to home with  outpatient follow-up.  ER return precautions given.  He is hemodynamically stable and in no acute distress at time of discharge.  Final Clinical Impression(s) / ED Diagnoses Final diagnoses:  None    Rx / DC Orders ED Discharge Orders     None        Barkley Boards, PA-C 05/10/21 0734    Gilda Crease, MD 05/15/21 [phone]

## 2021-05-10 NOTE — Discharge Instructions (Addendum)
Thank you for allowing me to care for you today in the Emergency Department.   Call Dr. Audrie Lia office to schedule a follow-up appointment to be seen within the next 3 to 4 days for reevaluation.  Continue your home pain medication as prescribed.  Take gabapentin 2 times daily to help with your pain.  If your pain is not well managed with this regimen, please follow-up with pain management.  Prescribing new medication from the ER could void your contract with pain management.  Return to the emergency department if you have redness that is spreading up the stump, persistent fevers despite having received antibiotics, worsening thick, mucus-like drainage from the stump, or other new, concerning symptoms.

## 2021-05-11 ENCOUNTER — Telehealth: Payer: Self-pay | Admitting: Orthopedic Surgery

## 2021-05-11 ENCOUNTER — Telehealth: Payer: Self-pay

## 2021-05-11 NOTE — Telephone Encounter (Signed)
Patient called advised he has an appointment tomorrow and do not have a way to get to the appointment. Patient asked if Cyndia Skeeters can help him get transportation so that he can keep his appointment? The number to contact patient is [phone]

## 2021-05-11 NOTE — Telephone Encounter (Signed)
Called [facility] transportation and they will reach out to pt to confirm transportation details for appt tomorrow. Pt is aware and will be awaiting call.

## 2021-05-11 NOTE — Telephone Encounter (Signed)
   Brett White DOB: 1960/12/26 MRN: [id number]   RIDER WAIVER AND RELEASE OF LIABILITY  For purposes of improving physical access to our facilities, [facility] is pleased to partner with third parties to provide [facility] patients or other authorized individuals the option of convenient, on-demand ground transportation services (the [employer]") through use of the technology service that enables users to request on-demand ground transportation from independent third-party providers.  By opting to use and accept these [employer], I, the undersigned, hereby agree on behalf of myself, and on behalf of any minor child using the Science writer for whom I am the parent or legal guardian, as follows:  Science writer provided to me are provided by independent third-party transportation providers who are not [employer] or employees and who are unaffiliated with [employer]. [place] is neither a transportation carrier nor a common or public carrier. [facility] has no control over the quality or safety of the transportation that occurs as a result of the [employer]. [facility] cannot guarantee that any third-party transportation provider will complete any arranged transportation service. [facility] makes no representation, warranty, or guarantee regarding the reliability, timeliness, quality, safety, suitability, or availability of any of the Transport Services or that they will be error free. I fully understand that traveling by vehicle involves risks and dangers of serious bodily injury, including permanent disability, paralysis, and death. I agree, on behalf of myself and on behalf of any minor child using the Transport Services for whom I am the parent or legal guardian, that the entire risk arising out of my use of the [employer] remains solely with me, to the maximum extent permitted under applicable law. The [employer] are provided "as  is" and "as available." [facility] disclaims all representations and warranties, express, implied or statutory, not expressly set out in these terms, including the implied warranties of merchantability and fitness for a particular purpose. I hereby waive and release [facility], its agents, employees, officers, directors, representatives, insurers, attorneys, assigns, successors, subsidiaries, and affiliates from any and all past, present, or future claims, demands, liabilities, actions, causes of action, or suits of any kind directly or indirectly arising from acceptance and use of the [employer]. I further waive and release [facility] and its affiliates from all present and future liability and responsibility for any injury or death to persons or damages to property caused by or related to the use of the [employer]. I have read this Waiver and Release of Liability, and I understand the terms used in it and their legal significance. This Waiver is freely and voluntarily given with the understanding that my right (as well as the right of any minor child for whom I am the parent or legal guardian using the [employer]) to legal recourse against [place] in connection with the [employer] is knowingly surrendered in return for use of these services.   I attest that I read the consent document to Jake Church, gave Mr. Moskowitz the opportunity to ask questions and answered the questions asked (if any). I affirm that Raman Featherston then provided consent for he's participation in this program.     Ephriam Knuckles Vilsaint

## 2021-05-12 ENCOUNTER — Other Ambulatory Visit: Payer: Self-pay

## 2021-05-12 ENCOUNTER — Encounter: Payer: Self-pay | Admitting: Physician Assistant

## 2021-05-12 ENCOUNTER — Ambulatory Visit (INDEPENDENT_AMBULATORY_CARE_PROVIDER_SITE_OTHER): Payer: Medicare HMO

## 2021-05-12 ENCOUNTER — Ambulatory Visit (INDEPENDENT_AMBULATORY_CARE_PROVIDER_SITE_OTHER): Payer: Medicare HMO | Admitting: Physician Assistant

## 2021-05-12 DIAGNOSIS — Z89512 Acquired absence of left leg below knee: Secondary | ICD-10-CM

## 2021-05-12 MED ORDER — OXYCODONE HCL 10 MG PO TABS: 10.0000 mg | ORAL_TABLET | Freq: Four times a day (QID) | ORAL | 0 refills | Status: DC | PRN

## 2021-05-12 NOTE — Progress Notes (Signed)
Office Visit Note   Patient: Brett White           Date of Birth: 06-28-1961           MRN: [id number] Visit Date: 05/12/2021              Requested by: No referring provider defined for this encounter. PCP: Patient, [name]  Chief Complaint  Patient presents with   Left Leg - Routine Post Op    03/11/21 left BKA eval in ER 05/09/21 s/p fall direct impact on limb 2 days after d/c from SNF  states that he fell going down his porch steps yesterday. Not wearing shrinker : I lost it" but is wearing limb protector.       HPI: Patient is a 60 year old gentleman who is 6 weeks status post left below-knee amputation.  He is currently living by himself at home.  2 days ago he did fall onto his amputation stump.  He was seen and evaluated in the emergency room.  He is having a lot more pain since this injury.  He denies any fever or chills.  He is no longer has his shrinkers and thinks they were left at the hospital  Assessment & Plan: Visit Diagnoses:  1. Acquired absence of left leg below knee [facility])     Plan: Patient was provided a prescription for shrinkers.  He thinks he has a friend who can pick these up for him.  Emphasized the importance of compression and keeping in getting off of the swelling.  Follow-up in 2 weeks and I am hopeful at that time we could go forward with giving him a prescription for a prosthetic  Follow-Up Instructions: No follow-ups on file.   Ortho Exam  Patient is alert, oriented, no adenopathy, well-dressed, normal affect, normal respiratory effort. Examination of his stump demonstrates moderate soft tissue swelling mild erythema but no cellulitis.  He does have an area of abrasion but this is dried up on the terminal end of the stump.  No signs of infection  Imaging: XR Knee 1-2 Views Left  Result Date: 05/12/2021 X-rays of his left amputation stump demonstrate no osseous injuries no dislocation  No images are attached to the  encounter.  Labs: Lab Results  Component Value Date   HGBA1C 11.5 (H) 02/03/2021   ESRSEDRATE 34 (H) 03/17/2021   CRP <0.5 03/17/2021   CRP 1.0 (H) 02/08/2021   CRP 0.8 02/05/2021   REPTSTATUS PENDING 05/10/2021   REPTSTATUS PENDING 05/10/2021   GRAMSTAIN  03/03/2021    RARE WBC PRESENT, PREDOMINANTLY MONONUCLEAR NO ORGANISMS SEEN    CULT  05/10/2021    NO GROWTH 1 DAY Performed at [facility] Lab, [address] [place], [place] [address]    CULT  05/10/2021    NO GROWTH 1 DAY Performed at [facility], [address], [place], [place] [address]    LABORGA METHICILLIN RESISTANT STAPHYLOCOCCUS AUREUS 03/03/2021     Lab Results  Component Value Date   ALBUMIN 3.8 03/16/2021   ALBUMIN 2.8 (L) 02/21/2021   ALBUMIN 3.5 02/03/2021    Lab Results  Component Value Date   MG 1.8 03/17/2021   MG 1.9 02/08/2021   MG 1.9 02/05/2021   No results found for: VD25OH  No results found for: PREALBUMIN CBC EXTENDED Latest Ref Rng & Units 05/10/2021 03/29/2021 03/24/2021  WBC 4.0 - 10.5 K/uL 6.1 6.4 9.8  RBC 4.22 - 5.81 MIL/uL 5.16 4.38  3.69(L)  HGB 13.0 - 17.0 g/dL 65.7 12.4(L) 10.6(L)  HCT 39.0 - 52.0 % 43.5 38.1(L) 32.5(L)  PLT 150 - 400 K/uL 356 251 252  NEUTROABS 1.7 - 7.7 K/uL 3.5 - -  LYMPHSABS 0.7 - 4.0 K/uL 2.0 - -     There is no height or weight on file to calculate BMI.  Orders:  Orders Placed This Encounter  Procedures   XR Knee 1-2 Views Left   No orders of the defined types were placed in this encounter.    Procedures: No procedures performed  Clinical Data: No additional findings.  ROS:  All other systems negative, except as noted in the HPI. Review of Systems  Objective: Vital Signs: There were no vitals taken for this visit.  Specialty Comments:  No specialty comments available.  PMFS History: Patient Active Problem List   Diagnosis Date Noted   Acquired absence of left leg below knee (HCC) 04/05/2021   Acute osteomyelitis of  left foot (HCC) 03/19/2021   Left foot infection 03/17/2021   Pain in left ankle and joints of left foot    Abscess of left foot 03/03/2021   Cutaneous abscess of left foot    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   Uncontrolled type 2 diabetes mellitus with hyperglycemia, without long-term current use of insulin (HCC) 02/03/2021   Essential hypertension 02/03/2021   Lactic acidosis 02/03/2021   Diabetic peripheral neuropathy associated with type 2 diabetes mellitus (HCC) 02/03/2021   Osteomyelitis (HCC) 02/03/2021   Cellulitis of toe of left foot    Past Medical History:  Diagnosis Date   Diabetes mellitus without complication (HCC)     Family History  Problem Relation Age of Onset   Heart attack Neg Hx     Past Surgical History:  Procedure Laterality Date   AMPUTATION Left 02/04/2021   Procedure: LEFT GREAT TOE AMPUTATION;  Surgeon: [name], MD;  Location: [facility];  Service: Orthopedics;  Laterality: Left;   AMPUTATION Left 02/10/2021   Procedure: LEFT FOOT 1ST RAY AMPUTATION;  Surgeon: [name], MD;  Location: [facility] OR;  Service: Orthopedics;  Laterality: Left;   AMPUTATION Left 03/22/2021   Procedure: LEFT BELOW KNEE AMPUTATION;  Surgeon: [name], MD;  Location: [facility] OR;  Service: Orthopedics;  Laterality: Left;   APPENDECTOMY     BACK SURGERY     I & D EXTREMITY Left 03/03/2021   Procedure: LEFT FOOT DEBRIDEMENT;  Surgeon: [name], MD;  Location: [facility] OR;  Service: Orthopedics;  Laterality: Left;   TONSILLECTOMY     Social History   Occupational History   Not on file  Tobacco Use   Smoking status: Never   Smokeless tobacco: Never  Substance and Sexual Activity   Alcohol use: Not Currently   Drug use: Not Currently   Sexual activity: Not on file

## 2021-05-15 LAB — CULTURE, BLOOD (ROUTINE X 2)
Culture: NO GROWTH
Culture: NO GROWTH
Special Requests: ADEQUATE
Special Requests: ADEQUATE

## 2021-05-19 ENCOUNTER — Telehealth: Payer: Self-pay | Admitting: Orthopedic Surgery

## 2021-05-19 ENCOUNTER — Other Ambulatory Visit: Payer: Self-pay | Admitting: Physician Assistant

## 2021-05-19 MED ORDER — OXYCODONE HCL 5 MG PO TABS: 5.0000 mg | ORAL_TABLET | Freq: Four times a day (QID) | ORAL | 0 refills | Status: DC | PRN

## 2021-05-19 NOTE — Telephone Encounter (Signed)
Patient called. He would like a refill on oxycodone. His call back number is [phone]

## 2021-05-19 NOTE — Telephone Encounter (Signed)
Done. Brett White he is 2 months post op so dose was lowered from 10 mg to 5 mg

## 2021-05-25 ENCOUNTER — Other Ambulatory Visit: Payer: Self-pay | Admitting: Orthopedic Surgery

## 2021-05-25 ENCOUNTER — Telehealth: Payer: Self-pay | Admitting: Orthopedic Surgery

## 2021-05-25 MED ORDER — OXYCODONE-ACETAMINOPHEN 5-325 MG PO TABS: 1.0000 | ORAL_TABLET | Freq: Four times a day (QID) | ORAL | 0 refills | Status: DC | PRN

## 2021-05-25 NOTE — Telephone Encounter (Signed)
Pt calling asking if he can get a refill prescription of percocet instead of oxycodone. The best pharmacy is the [facility]. Pt asking if he can get a call back once something has been sent in so he will know to go get it. The best call back number is [phone].

## 2021-05-26 ENCOUNTER — Ambulatory Visit: Payer: Medicare HMO | Admitting: Family

## 2021-05-26 ENCOUNTER — Ambulatory Visit (INDEPENDENT_AMBULATORY_CARE_PROVIDER_SITE_OTHER): Payer: Medicare HMO | Admitting: Family

## 2021-05-26 ENCOUNTER — Ambulatory Visit: Payer: Self-pay

## 2021-05-26 ENCOUNTER — Encounter: Payer: Self-pay | Admitting: Family

## 2021-05-26 DIAGNOSIS — M79605 Pain in left leg: Secondary | ICD-10-CM

## 2021-05-26 NOTE — Progress Notes (Signed)
Post-Op Visit Note   Patient: Brett White           Date of Birth: Apr 24, 1961           MRN: [id number] Visit Date: 05/26/2021 PCP: Patient, [name]  Chief Complaint: No chief complaint on file.   HPI:  HPI The patient is a 60 year old gentleman seen status post left below-knee amputation June 22 this is well-healed unfortunately he has had multiple falls and did have a hard fall yesterday getting out of the shower he is complaining of very significant tenderness and pain along the tibia.  He states he will be moving into the skilled nursing facility within the next week  Ortho Exam On examination of the left below-knee amputation this is well-healed there is no gaping no drainage no area of the incision that is unhealed.  There is no erythema no sign of infection does have significant tenderness and moderate swelling anteriorly Visit Diagnoses: No diagnosis found.  Plan: Reassurance provided.  No radiographic evidence of fracture.  He will proceed with prosthesis set up we plan to see him in the office in about 3 months continue with shrinker around-the-clock.  Follow-Up Instructions: No follow-ups on file.   Imaging: No results found.  Orders:  No orders of the defined types were placed in this encounter.  No orders of the defined types were placed in this encounter.    PMFS History: Patient Active Problem List   Diagnosis Date Noted   Acquired absence of left leg below knee (HCC) 04/05/2021   Acute osteomyelitis of left foot (HCC) 03/19/2021   Left foot infection 03/17/2021   Pain in left ankle and joints of left foot    Abscess of left foot 03/03/2021   Cutaneous abscess of left foot    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   Uncontrolled type 2 diabetes mellitus with hyperglycemia, without long-term current use of insulin (HCC) 02/03/2021   Essential hypertension 02/03/2021   Lactic acidosis 02/03/2021   Diabetic peripheral  neuropathy associated with type 2 diabetes mellitus (HCC) 02/03/2021   Osteomyelitis (HCC) 02/03/2021   Cellulitis of toe of left foot    Past Medical History:  Diagnosis Date   Diabetes mellitus without complication (HCC)     Family History  Problem Relation Age of Onset   Heart attack Neg Hx     Past Surgical History:  Procedure Laterality Date   AMPUTATION Left 02/04/2021   Procedure: LEFT GREAT TOE AMPUTATION;  Surgeon: [name], MD;  Location: [facility] OR;  Service: Orthopedics;  Laterality: Left;   AMPUTATION Left 02/10/2021   Procedure: LEFT FOOT 1ST RAY AMPUTATION;  Surgeon: [name], MD;  Location: [facility] OR;  Service: Orthopedics;  Laterality: Left;   AMPUTATION Left 03/22/2021   Procedure: LEFT BELOW KNEE AMPUTATION;  Surgeon: [name], MD;  Location: [facility] OR;  Service: Orthopedics;  Laterality: Left;   APPENDECTOMY     BACK SURGERY     I & D EXTREMITY Left 03/03/2021   Procedure: LEFT FOOT DEBRIDEMENT;  Surgeon: [name], MD;  Location: [facility] OR;  Service: Orthopedics;  Laterality: Left;   TONSILLECTOMY     Social History   Occupational History   Not on file  Tobacco Use   Smoking status: Never   Smokeless tobacco: Never  Substance and Sexual Activity   Alcohol use: Not Currently   Drug use: Not Currently   Sexual activity: Not on file

## 2021-05-29 ENCOUNTER — Encounter: Payer: Medicare HMO | Admitting: Orthopedic Surgery

## 2021-06-01 ENCOUNTER — Telehealth: Payer: Self-pay | Admitting: Orthopedic Surgery

## 2021-06-01 ENCOUNTER — Other Ambulatory Visit: Payer: Self-pay | Admitting: Physician Assistant

## 2021-06-01 MED ORDER — OXYCODONE-ACETAMINOPHEN 5-325 MG PO TABS: 1.0000 | ORAL_TABLET | Freq: Four times a day (QID) | ORAL | 0 refills | Status: DC | PRN

## 2021-06-01 NOTE — Telephone Encounter (Signed)
Pt is s/p a left BKA 03/22/21 requesting refill on oxycodone last refill 05/25/21 #30

## 2021-06-01 NOTE — Telephone Encounter (Signed)
Pt called requesting stronger oxycodone. Please send to pharmacy on file. Please call pt at [phone].

## 2021-06-01 NOTE — Telephone Encounter (Signed)
Pt called about update about pain meds. Please call pt when called in. Pt phone number is [phone].

## 2021-06-01 NOTE — Telephone Encounter (Signed)
Refilled at same strength. He is almost 3 months out from surgery at which point he will have to be referred to pain management if he feels he still needs narcotics

## 2021-06-02 NOTE — Telephone Encounter (Signed)
This has been done see last  message

## 2021-06-02 NOTE — Telephone Encounter (Signed)
I called pt and advised of message below.  

## 2021-06-29 ENCOUNTER — Other Ambulatory Visit: Payer: Self-pay

## 2021-06-29 ENCOUNTER — Emergency Department (HOSPITAL_COMMUNITY): Payer: Medicare HMO

## 2021-06-29 ENCOUNTER — Encounter (HOSPITAL_COMMUNITY): Payer: Self-pay | Admitting: Emergency Medicine

## 2021-06-29 ENCOUNTER — Emergency Department (HOSPITAL_COMMUNITY)
Admission: EM | Admit: 2021-06-29 | Discharge: 2021-06-30 | Disposition: A | Discharge status: Home / Self Care | Payer: Medicare HMO | Attending: Emergency Medicine | Admitting: Emergency Medicine

## 2021-06-29 DIAGNOSIS — I10 Essential (primary) hypertension: Secondary | ICD-10-CM

## 2021-06-29 DIAGNOSIS — I739 Peripheral vascular disease, unspecified: Secondary | ICD-10-CM

## 2021-06-29 DIAGNOSIS — Z20822 Contact with and (suspected) exposure to covid-19: Secondary | ICD-10-CM | POA: Diagnosis not present

## 2021-06-29 DIAGNOSIS — R519 Headache, unspecified: Secondary | ICD-10-CM | POA: Insufficient documentation

## 2021-06-29 DIAGNOSIS — Z794 Long term (current) use of insulin: Secondary | ICD-10-CM | POA: Diagnosis not present

## 2021-06-29 DIAGNOSIS — E1165 Type 2 diabetes mellitus with hyperglycemia: Secondary | ICD-10-CM | POA: Diagnosis not present

## 2021-06-29 DIAGNOSIS — M79604 Pain in right leg: Secondary | ICD-10-CM | POA: Insufficient documentation

## 2021-06-29 DIAGNOSIS — M79671 Pain in right foot: Secondary | ICD-10-CM | POA: Diagnosis not present

## 2021-06-29 DIAGNOSIS — Z7984 Long term (current) use of oral hypoglycemic drugs: Secondary | ICD-10-CM | POA: Insufficient documentation

## 2021-06-29 DIAGNOSIS — Z79899 Other long term (current) drug therapy: Secondary | ICD-10-CM | POA: Insufficient documentation

## 2021-06-29 DIAGNOSIS — R739 Hyperglycemia, unspecified: Secondary | ICD-10-CM

## 2021-06-29 DIAGNOSIS — L03317 Cellulitis of buttock: Secondary | ICD-10-CM

## 2021-06-29 DIAGNOSIS — F0150 Vascular dementia without behavioral disturbance: Secondary | ICD-10-CM | POA: Diagnosis not present

## 2021-06-29 DIAGNOSIS — E1140 Type 2 diabetes mellitus with diabetic neuropathy, unspecified: Secondary | ICD-10-CM | POA: Diagnosis not present

## 2021-06-29 LAB — I-STAT CHEM 8, ED
BUN: 26 mg/dL — ABNORMAL HIGH (ref 6–20)
Calcium, Ion: 1.2 mmol/L (ref 1.15–1.40)
Chloride: 99 mmol/L (ref 98–111)
Creatinine, Ser: 1 mg/dL (ref 0.61–1.24)
Glucose, Bld: 531 mg/dL (ref 70–99)
HCT: 40 % (ref 39.0–52.0)
Hemoglobin: 13.6 g/dL (ref 13.0–17.0)
Potassium: 4.4 mmol/L (ref 3.5–5.1)
Sodium: 135 mmol/L (ref 135–145)
TCO2: 29 mmol/L (ref 22–32)

## 2021-06-29 LAB — CBG MONITORING, ED
Glucose-Capillary: 489 mg/dL — ABNORMAL HIGH (ref 70–99)
Glucose-Capillary: 366 mg/dL — ABNORMAL HIGH (ref 70–99)

## 2021-06-29 LAB — BLOOD GAS, VENOUS
Acid-Base Excess: 1.9 mmol/L (ref 0.0–2.0)
Bicarbonate: 27.7 mmol/L (ref 20.0–28.0)
FIO2: 21
O2 Saturation: 43.6 %
Patient temperature: 98.6
pCO2, Ven: 50.8 mmHg (ref 44.0–60.0)
pH, Ven: 7.356 (ref 7.250–7.430)
pO2, Ven: <31 mmHg — CL (ref 32.0–45.0)

## 2021-06-29 LAB — COMPREHENSIVE METABOLIC PANEL
ALT: 10 U/L (ref 0–44)
AST: 17 U/L (ref 15–41)
Albumin: 3.8 g/dL (ref 3.5–5.0)
Alkaline Phosphatase: 116 U/L (ref 38–126)
Anion gap: 10 (ref 5–15)
BUN: 23 mg/dL — ABNORMAL HIGH (ref 6–20)
CO2: 27 mmol/L (ref 22–32)
Calcium: 9.3 mg/dL (ref 8.9–10.3)
Chloride: 100 mmol/L (ref 98–111)
Creatinine, Ser: 1.05 mg/dL (ref 0.61–1.24)
GFR, Estimated: >60 mL/min (ref >60)
Glucose, Bld: 520 mg/dL (ref 70–99)
Potassium: 4.4 mmol/L (ref 3.5–5.1)
Sodium: 137 mmol/L (ref 135–145)
Total Bilirubin: 0.3 mg/dL (ref 0.3–1.2)
Total Protein: 7.9 g/dL (ref 6.5–8.1)

## 2021-06-29 LAB — CBC WITH DIFFERENTIAL/PLATELET
Abs Immature Granulocytes: 0.03 10*3/uL (ref 0.00–0.07)
Basophils Absolute: 0.1 10*3/uL (ref 0.0–0.1)
Basophils Relative: 1 %
Eosinophils Absolute: 0.2 10*3/uL (ref 0.0–0.5)
Eosinophils Relative: 2 %
HCT: 40.5 % (ref 39.0–52.0)
Hemoglobin: 13.4 g/dL (ref 13.0–17.0)
Immature Granulocytes: 1 %
Lymphocytes Relative: 22 %
Lymphs Abs: 1.5 10*3/uL (ref 0.7–4.0)
MCH: 27.5 pg (ref 26.0–34.0)
MCHC: 33.1 g/dL (ref 30.0–36.0)
MCV: 83.2 fL (ref 80.0–100.0)
Monocytes Absolute: 0.4 10*3/uL (ref 0.1–1.0)
Monocytes Relative: 7 %
Neutro Abs: 4.4 10*3/uL (ref 1.7–7.7)
Neutrophils Relative %: 67 %
Platelets: 329 10*3/uL (ref 150–400)
RBC: 4.87 MIL/uL (ref 4.22–5.81)
RDW: 13 % (ref 11.5–15.5)
WBC: 6.6 10*3/uL (ref 4.0–10.5)
nRBC: 0 % (ref 0.0–0.2)

## 2021-06-29 LAB — RESP PANEL BY RT-PCR (FLU A&B, COVID) ARPGX2
Influenza A by PCR: NEGATIVE
Influenza B by PCR: NEGATIVE
SARS Coronavirus 2 by RT PCR: NEGATIVE

## 2021-06-29 LAB — TROPONIN I (HIGH SENSITIVITY)
Troponin I (High Sensitivity): 6 ng/L (ref <18)
Troponin I (High Sensitivity): 7 ng/L (ref <18)

## 2021-06-29 IMAGING — DX DG CHEST 1V
1 series · 1 of 1 positions shown · non-contrast
Comparison: Chest x-ray [DATE].

CLINICAL DATA: Right foot pain.

EXAM:
CHEST  1 VIEW

[chest ap]
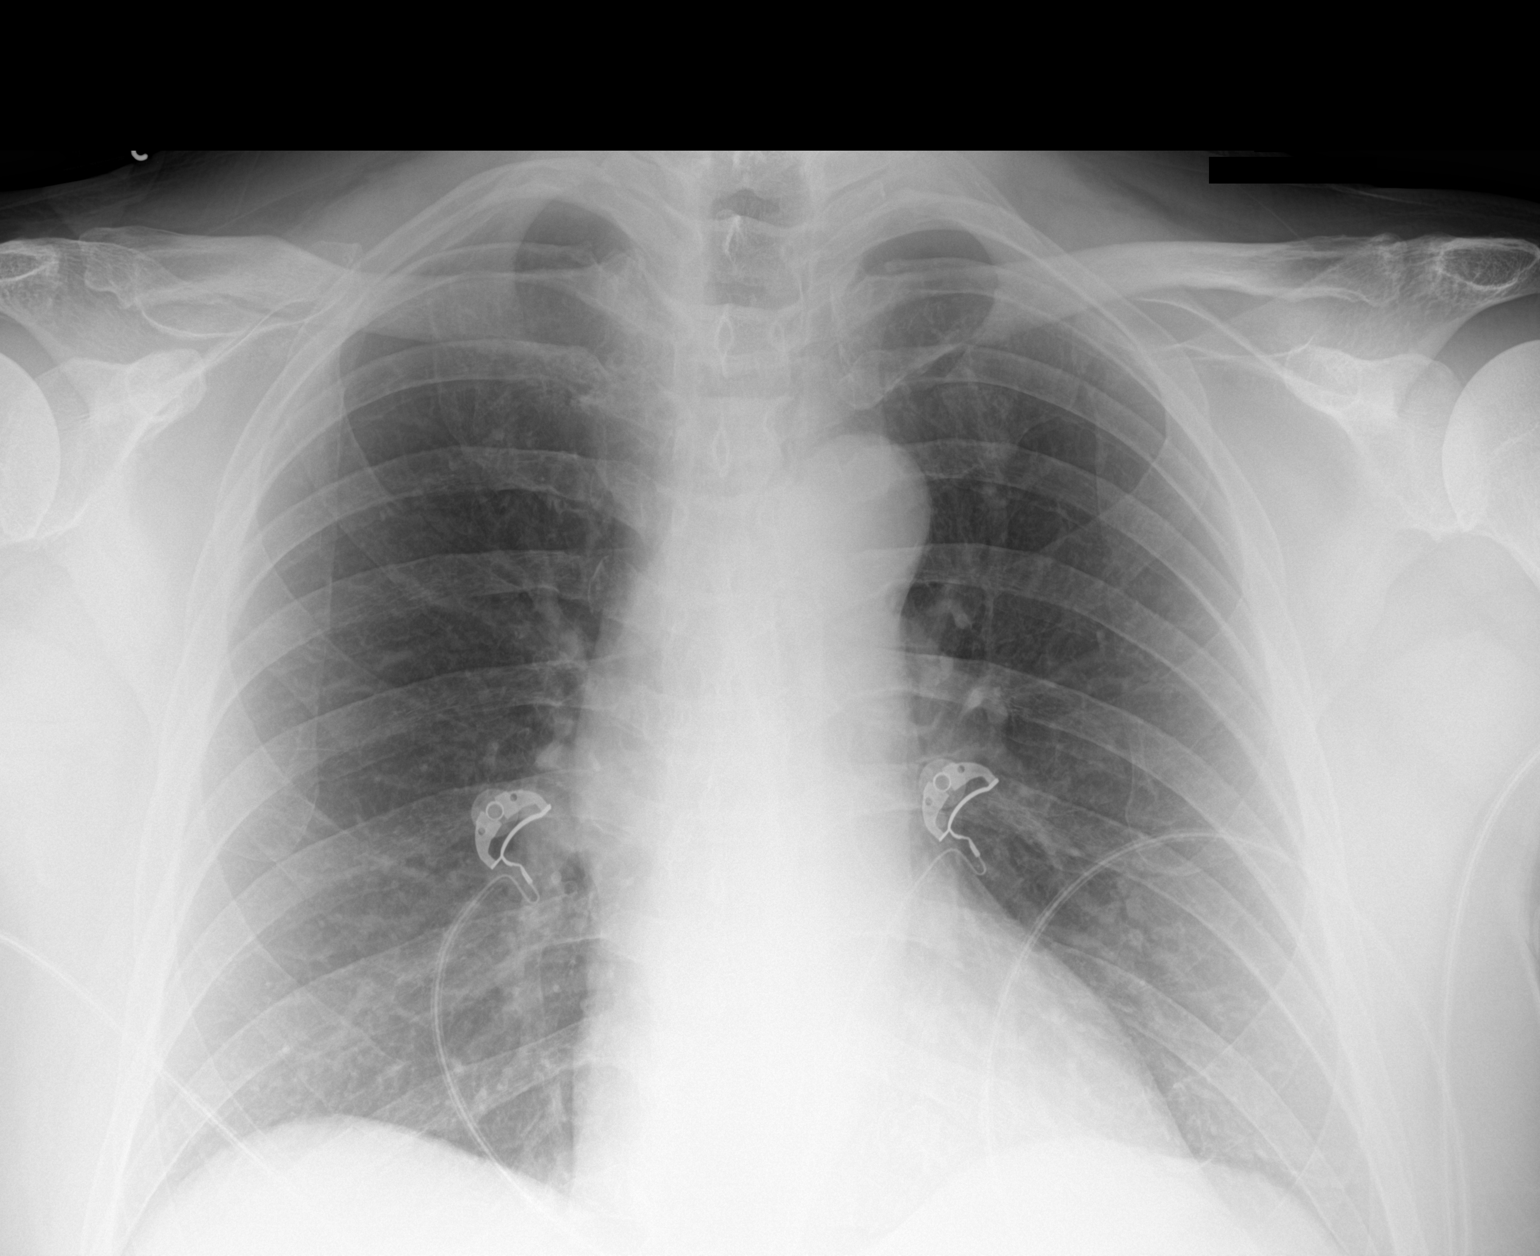

[1 of 1 positions shown; findings below may reference images not displayed]

FINDINGS: The heart size and mediastinal contours are within normal limits.
Both lungs are clear. The visualized skeletal structures are
unremarkable.
IMPRESSION: No active disease.

## 2021-06-29 IMAGING — DX DG FOOT COMPLETE 3+V*R*
3 series · 3 of 3 positions shown · non-contrast
Comparison: None.

CLINICAL DATA: Right great toe pain, swelling

EXAM:
RIGHT FOOT COMPLETE - 3+ VIEW

[foot obl]
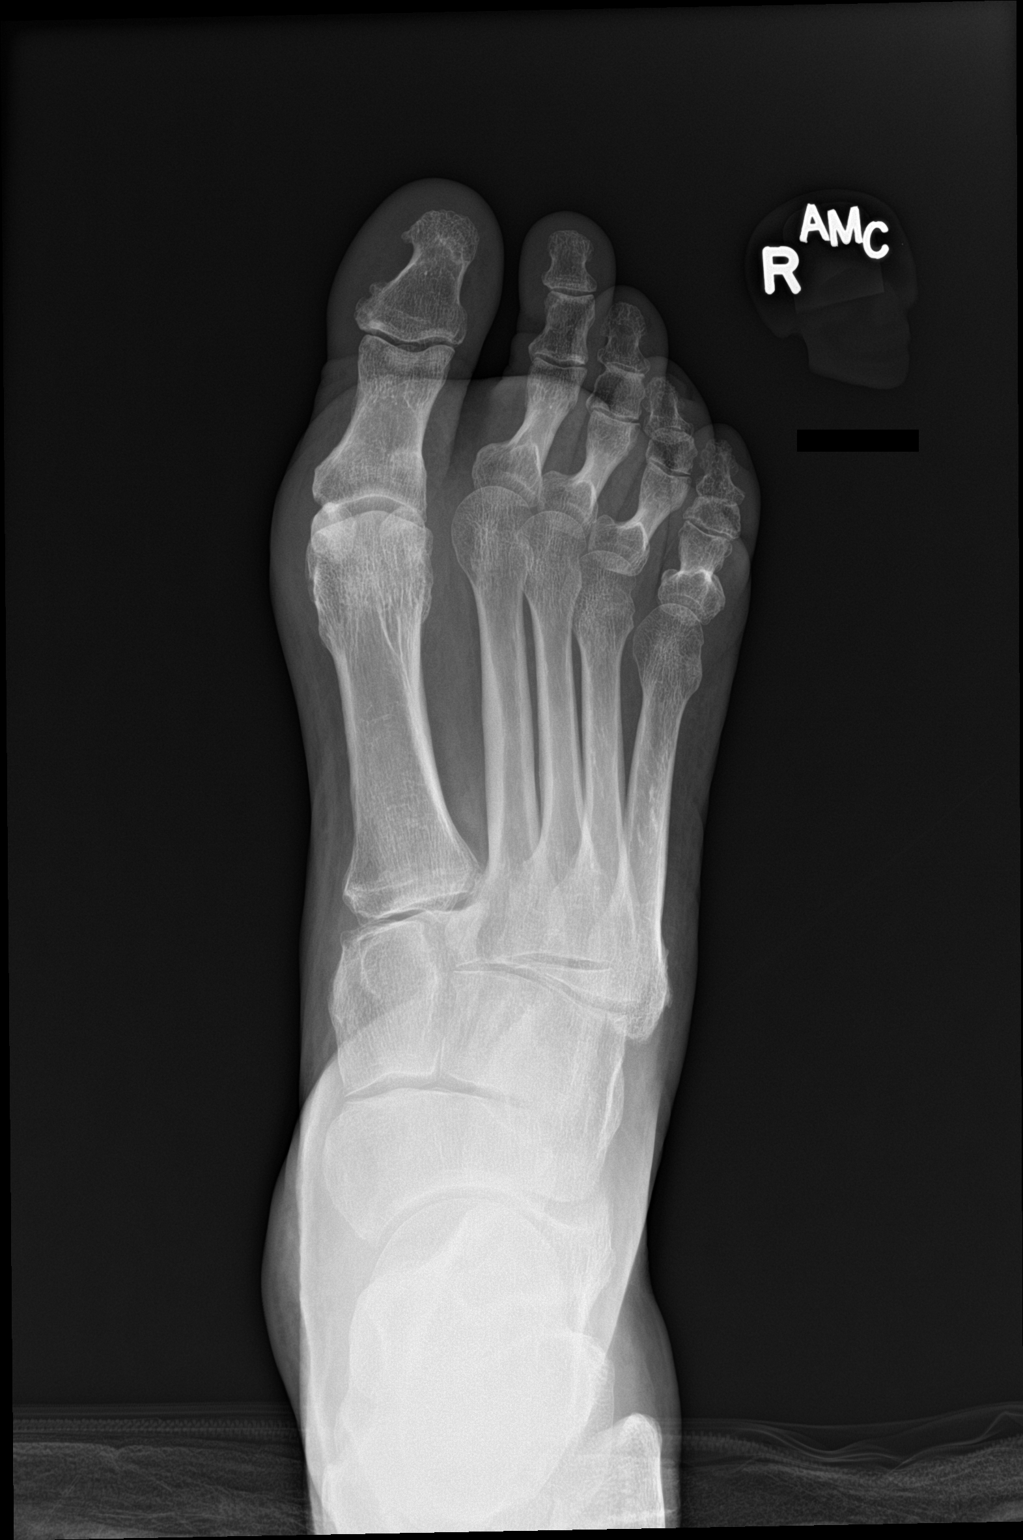

[foot ap]
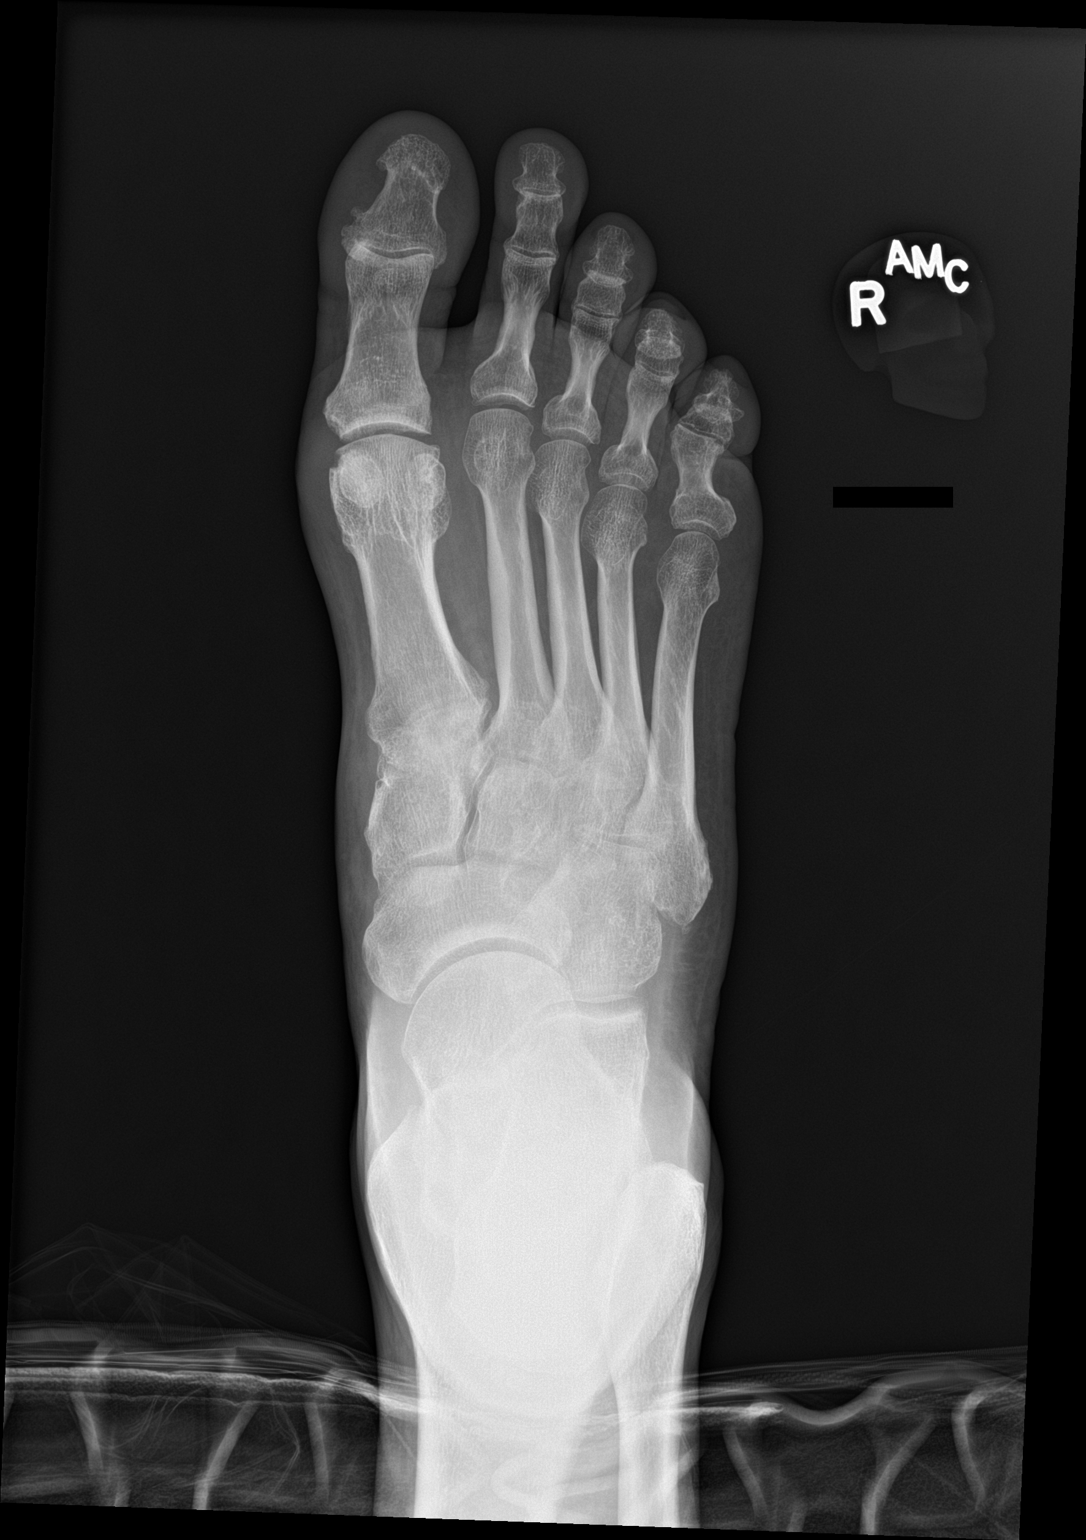

[foot lat]
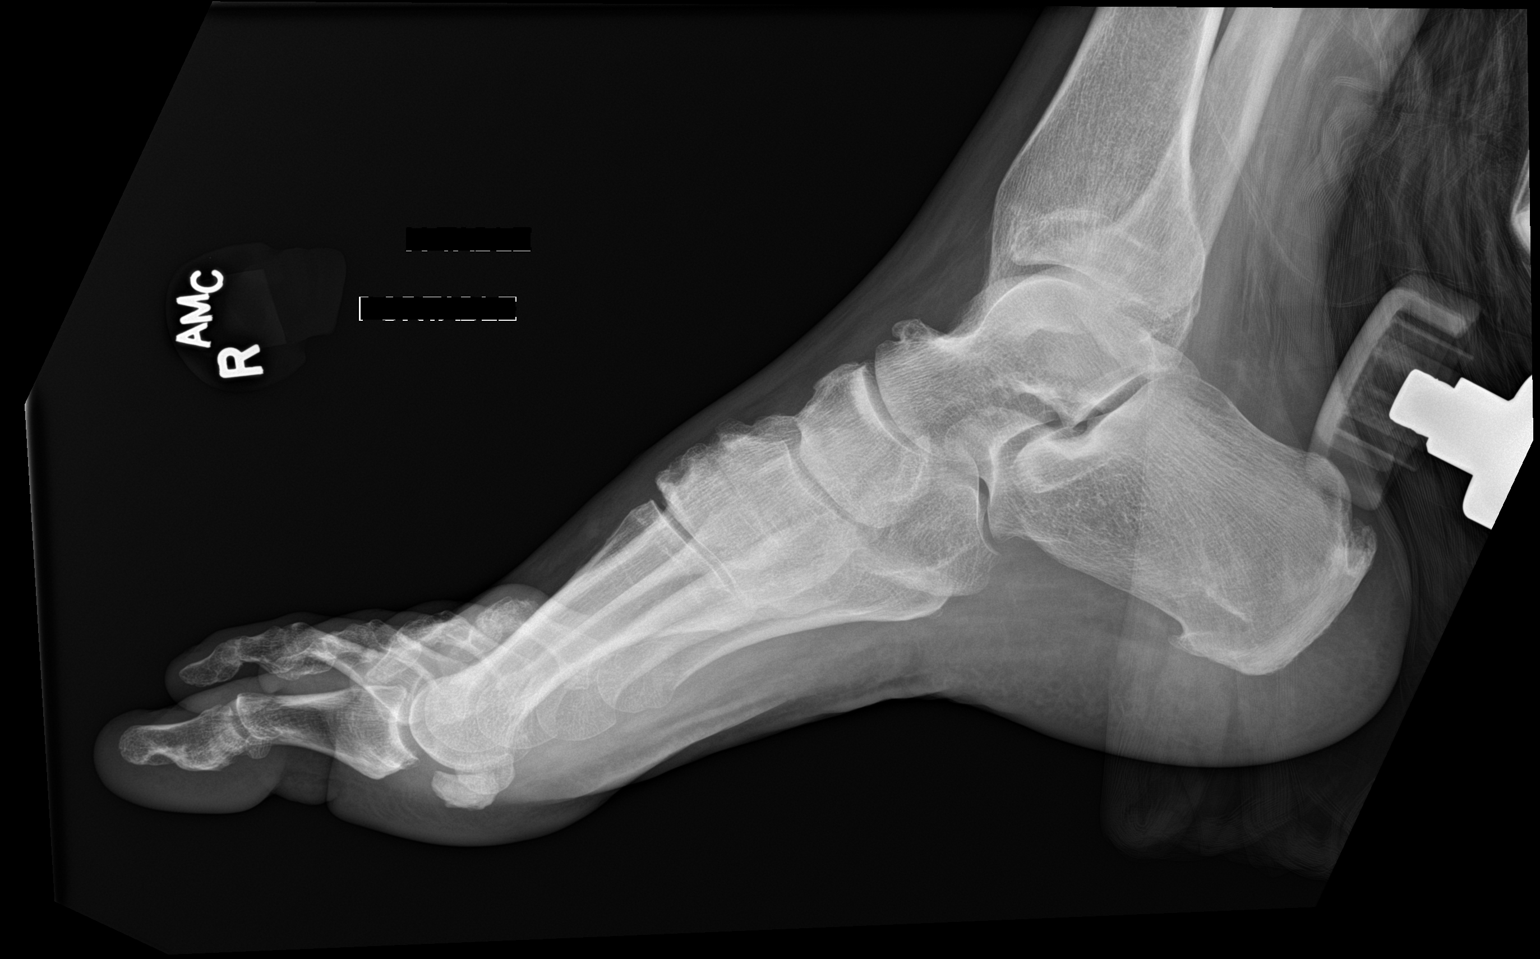

[3 of 3 positions shown; findings below may reference images not displayed]

FINDINGS: Frontal, oblique, and lateral views of the right foot are obtained.
No acute fracture, subluxation, or dislocation. There is prominent
joint space narrowing and osteophyte formation throughout the
midfoot. Mild joint space narrowing and osteophyte formation
throughout the metatarsophalangeal and interphalangeal joints. At
the first MTP, there is overlying soft tissue swelling and a small
marginal erosion of the first metatarsal head. Small superior and
inferior calcaneal spurs. Soft tissues are otherwise unremarkable.
IMPRESSION: 1. No acute fracture.
2. Diffuse osteoarthritis.
3. Nonspecific marginal erosion of the first metatarsal head, with
overlying soft tissue swelling. Please correlate with any symptoms
or laboratory evidence of gout.

## 2021-06-29 MED ORDER — SODIUM CHLORIDE 0.9 % IV BOLUS
1000.0000 mL | Freq: Once | INTRAVENOUS | Status: AC
  Administered 2021-06-29: 1000 mL via INTRAVENOUS

## 2021-06-29 MED ORDER — DOXYCYCLINE HYCLATE 100 MG PO TABS
100.0000 mg | ORAL_TABLET | Freq: Once | ORAL | Status: AC
  Administered 2021-06-29: 100 mg via ORAL
  Filled 2021-06-29: qty 1

## 2021-06-29 MED ORDER — INSULIN ASPART 100 UNIT/ML IJ SOLN
10.0000 [IU] | Freq: Once | INTRAMUSCULAR | Status: AC
  Administered 2021-06-29: 10 [IU] via SUBCUTANEOUS
  Filled 2021-06-29: qty 0.1

## 2021-06-29 MED ORDER — IOHEXOL 350 MG/ML SOLN
100.0000 mL | Freq: Once | INTRAVENOUS | Status: AC | PRN
  Administered 2021-06-29: 100 mL via INTRAVENOUS

## 2021-06-29 MED ORDER — MORPHINE SULFATE (PF) 4 MG/ML IV SOLN
4.0000 mg | Freq: Once | INTRAVENOUS | Status: AC
  Administered 2021-06-29: 4 mg via INTRAVENOUS
  Filled 2021-06-29: qty 1

## 2021-06-29 MED ORDER — HYDRALAZINE HCL 20 MG/ML IJ SOLN
5.0000 mg | Freq: Once | INTRAMUSCULAR | Status: AC
  Administered 2021-06-29: 5 mg via INTRAVENOUS
  Filled 2021-06-29: qty 1

## 2021-06-29 MED ORDER — ASPIRIN 81 MG PO CHEW: 81.0000 mg | CHEWABLE_TABLET | Freq: Every day | ORAL | 0 refills | Status: DC

## 2021-06-29 MED ORDER — INSULIN ASPART 100 UNIT/ML IJ SOLN
5.0000 [IU] | Freq: Once | INTRAMUSCULAR | Status: AC
  Administered 2021-06-29: 5 [IU] via SUBCUTANEOUS
  Filled 2021-06-29: qty 0.05

## 2021-06-29 MED ORDER — HYDRALAZINE HCL 20 MG/ML IJ SOLN
10.0000 mg | Freq: Once | INTRAMUSCULAR | Status: AC
  Administered 2021-06-29: 10 mg via INTRAVENOUS
  Filled 2021-06-29: qty 1

## 2021-06-29 MED ORDER — HYDROMORPHONE HCL 1 MG/ML IJ SOLN
1.0000 mg | Freq: Once | INTRAMUSCULAR | Status: AC
  Administered 2021-06-29: 1 mg via INTRAVENOUS
  Filled 2021-06-29: qty 1

## 2021-06-29 MED ORDER — ATORVASTATIN CALCIUM 10 MG PO TABS: 10.0000 mg | ORAL_TABLET | Freq: Every day | ORAL | 0 refills | Status: DC

## 2021-06-29 MED ORDER — HYDRALAZINE HCL 20 MG/ML IJ SOLN: 10.0000 mg | Freq: Once | INTRAMUSCULAR | Status: DC

## 2021-06-29 MED ORDER — DOXYCYCLINE HYCLATE 100 MG PO CAPS: 100.0000 mg | ORAL_CAPSULE | Freq: Two times a day (BID) | ORAL | 0 refills | Status: DC

## 2021-06-29 NOTE — Progress Notes (Signed)
Transition of Care [facility]) - Emergency Department Mini Assessment   Patient Details  Name: Brett White MRN: [id number] Date of Birth: 05-Dec-1960  Transition of Care [facility]) CM/SW Contact:    Deundra Furber C Tarpley-Carter, LCSWA Phone Number: 06/29/2021, 8:52 PM   Clinical Narrative: TOC CSW attempted to consult with pt, but due to Dementia pt is a poor historian.  CSW contacted Marisa Sprinkles [phone] with pts permission.    Conleigh Heinlein Tarpley-Carter, MSW, LCSW-A Pronouns:  She/Her/Hers [facility] of [facility] Clinical Social Worker Direct Number:  [phone] [email or web address]  ED Mini Assessment: What brought you to the Emergency Department? : Pain in left big toe  Barriers to Discharge: No Barriers Identified     Means of departure: Ambulance  Interventions which prevented an admission or readmission: Home Health Consult or Services, Transportation Screening    Patient Contact and Communications Key Contact 1: Marisa Sprinkles   Spoke with: Kandis Fantasia Date: 06/29/21,     Contact Phone Number: [phone] Call outcome: Due to pts Dementia he is a poor historian.  Marcelino Duster assisted with a lot of information to help with pts care.  Patient states their goals for this hospitalization and ongoing recovery are:: N/A due to Dementia   Choice offered to / list presented to : NA  Admission diagnosis:  HTN, Hyperglycemia Patient Active Problem List   Diagnosis Date Noted   Acquired absence of left leg below knee (HCC) 04/05/2021   Acute osteomyelitis of left foot (HCC) 03/19/2021   Left foot infection 03/17/2021   Pain in left ankle and joints of left foot    Abscess of left foot 03/03/2021   Cutaneous abscess of left foot    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   Uncontrolled type 2 diabetes mellitus with hyperglycemia, without long-term current use of insulin (HCC) 02/03/2021   Essential  hypertension 02/03/2021   Lactic acidosis 02/03/2021   Diabetic peripheral neuropathy associated with type 2 diabetes mellitus (HCC) 02/03/2021   Osteomyelitis (HCC) 02/03/2021   Cellulitis of toe of left foot    PCP:  System, Provider Not In Pharmacy:   [id number] - [place], [place] - [address] AT [facility] [address] [place] [address] Phone: [phone] Fax: [phone]

## 2021-06-29 NOTE — Progress Notes (Signed)
TOC CSW attempted to contact [facility] [phone].  CSW left HIPPA compliant message with my contact information.   Pacey Altizer Tarpley-Carter, MSW, LCSW-A Pronouns:  She/Her/Hers [facility] of [facility] Clinical Social Worker Direct Number:  [phone] [email or web address]

## 2021-06-29 NOTE — ED Triage Notes (Signed)
PT BIBA  Per EMS, pt coming from home with c/o HTN, hyperglycemia and pain on left big toe. Pt has a hx of htn and has been out of meds since March. Pt also endorses Headache, dizziness, SOB. Fell on 9/22 and refused transport to hospital. Uses walker to ambulate. hx of stroke with vascular dementia.    BP 242/128 manual CBG 518 100% room air

## 2021-06-29 NOTE — Discharge Instructions (Addendum)
You need to fill your prescriptions for hypertension and diabetes   Take doxycycline twice daily for a week.  Case management will reach out to your home health agency tomorrow.  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  Please see vascular surgery for follow-up.  Vascular surgeon recommend taking aspirin daily and also Lipitor 10 mg daily.  Return to ER if you have worse headache, chest pain, leg pain, foot pain, dizziness, toes turning blue

## 2021-06-29 NOTE — Progress Notes (Signed)
TOC CSW received a return call from [facility] [phone].  Marcelino Duster is pts ex-wife, but pt relies on her as his emergency contact.  She was a huge help with information.  Pts Dr. Cyndie Chime was assisting pt with obtaining Medicaid and getting into a program that would bubble wrap pts meds, due to pts Dementia.  Pt lives independently.  He does have the support of his neighbor Gray Bernhardt "Pops" Milton [phone] or [phone].  CSW will refer pt to PACE program for assistance as well.  Cristobal Advani Tarpley-Carter, MSW, LCSW-A Pronouns:  She/Her/Hers [facility] of Care Clinical Social Worker Direct Number:  [phone] [email or web address]

## 2021-06-29 NOTE — H&P (Signed)
Hospital Consult    Reason for Consult: Concern for right leg ischemia Requesting Physician: Silverio Lay MRN #:  [id number]  History of Present Illness: This is a 60 y.o. male with known peripheral vascular disease, previous left below-knee amputation Lajoyce Corners), diabetes, peripheral neuropathy, hypertension, who presented to the ED at the request of his wound care nurse after she appreciated coolness in the patient's right foot.  Vascular surgery was called after the dorsalis pedis pulse was not palpable.  The patient is a poor historian, but noted increased pain in the right forefoot that has been present for the last 5 days after the toenail on his big toe was accidentally pulled off.  Since that time he has had waxing waning pain.  He has been nonambulatory, not because of the pain, but because of poor upper body strength when using a walker.  Pain is relieved by pain medication.  He denies symptoms of claudication, ischemic rest pain, nonhealing wounds.  Past Medical History:  Diagnosis Date   Diabetes mellitus without complication [facility])     Past Surgical History:  Procedure Laterality Date   AMPUTATION Left 02/04/2021   Procedure: LEFT GREAT TOE AMPUTATION;  Surgeon: [name], MD;  Location: [facility] OR;  Service: Orthopedics;  Laterality: Left;   AMPUTATION Left 02/10/2021   Procedure: LEFT FOOT 1ST RAY AMPUTATION;  Surgeon: [name], MD;  Location: [facility] OR;  Service: Orthopedics;  Laterality: Left;   AMPUTATION Left 03/22/2021   Procedure: LEFT BELOW KNEE AMPUTATION;  Surgeon: [name], MD;  Location: [facility] OR;  Service: Orthopedics;  Laterality: Left;   APPENDECTOMY     BACK SURGERY     I & D EXTREMITY Left 03/03/2021   Procedure: LEFT FOOT DEBRIDEMENT;  Surgeon: [name], MD;  Location: [facility] OR;  Service: Orthopedics;  Laterality: Left;   TONSILLECTOMY      No Known Allergies  Prior to Admission medications   Medication Sig Start Date End Date Taking? Authorizing Provider   DULoxetine (CYMBALTA) 30 MG capsule Take 30 mg by mouth daily.    [provider]  gabapentin (NEURONTIN) 300 MG capsule Take 1 capsule (300 mg total) by mouth 2 (two) times daily. 05/10/21 06/09/21  McDonald, Mia A, PA-C  insulin glargine (LANTUS) 100 UNIT/ML injection Inject 0.05 mLs (5 Units total) into the skin 2 (two) times daily. 03/29/21   Marguerita Merles Latif, DO  losartan (COZAAR) 25 MG tablet Take 1 tablet (25 mg total) by mouth daily. 03/30/21   Marguerita Merles Latif, DO  metFORMIN (GLUCOPHAGE) 500 MG tablet Take 500 mg by mouth daily with breakfast.    [provider]  methocarbamol (ROBAXIN) 500 MG tablet Take 1 tablet (500 mg total) by mouth every 6 (six) hours as needed for muscle spasms. 02/17/21   Lanae Boast, MD  NOVOLOG FLEXPEN 100 UNIT/ML FlexPen Inject 5 Units into the skin with breakfast, with lunch, and with evening meal. 02/19/21   [provider]  oxyCODONE-acetaminophen (PERCOCET/ROXICET) 5-325 MG tablet Take 1 tablet by mouth every 6 (six) hours as needed for severe pain. 06/01/21   Persons, [place], PA  pantoprazole (PROTONIX) 40 MG tablet Take 1 tablet (40 mg total) by mouth daily. 02/18/21   Lanae Boast, MD    Social History   Socioeconomic History   Marital status: Divorced    Spouse name: Not on file   Number of children: Not on file   Years of education: Not on file   Highest education  level: Not on file  Occupational History   Not on file  Tobacco Use   Smoking status: Never   Smokeless tobacco: Never  Substance and Sexual Activity   Alcohol use: Not Currently   Drug use: Not Currently   Sexual activity: Not on file  Other Topics Concern   Not on file  Social History Narrative   Not on file   Social Determinants of Health   Financial Resource Strain: Not on file  Food Insecurity: Not on file  Transportation Needs: Not on file  Physical Activity: Not on file  Stress: Not on file  Social Connections: Not on file  Intimate  Partner Violence: Not on file     Family History  Problem Relation Age of Onset   Heart attack Neg Hx     ROS: Otherwise negative unless mentioned in HPI  Physical Examination  Vitals:   06/29/21 1930 06/29/21 2015  BP: (!) 145/106 (!) 181/113  Pulse: 78 81  Resp: 14 20  SpO2: 98% 98%   Body mass index is 26.98 kg/m.  General:  WDWN in NAD Gait: Not observed HENT: WNL, normocephalic Pulmonary: normal non-labored breathing, without Rales, rhonchi,  wheezing Cardiac: regula Abdomen:  soft, NT/ND, no masses Skin: without rashes Vascular Exam/Pulses: 2+ PT pulse in the right foot. Palpable femoral pulses bilaterally Extremities: without ischemic changes, without Gangrene , without cellulitis; with open wounds- right first toenail Musculoskeletal: no muscle wasting or atrophy  Neurologic: A&O X 3;  No focal weakness or paresthesias are detected; speech is fluent/normal Psychiatric:  The pt has Normal affect. Lymph:  Unremarkable  CBC    Component Value Date/Time   WBC 6.6 06/29/2021 1729   RBC 4.87 06/29/2021 1729   HGB 13.6 06/29/2021 1806   HCT 40.0 06/29/2021 1806   PLT 329 06/29/2021 1729   MCV 83.2 06/29/2021 1729   MCH 27.5 06/29/2021 1729   MCHC 33.1 06/29/2021 1729   RDW 13.0 06/29/2021 1729   LYMPHSABS 1.5 06/29/2021 1729   MONOABS 0.4 06/29/2021 1729   EOSABS 0.2 06/29/2021 1729   BASOSABS 0.1 06/29/2021 1729    BMET    Component Value Date/Time   NA 135 06/29/2021 1806   K 4.4 06/29/2021 1806   CL 99 06/29/2021 1806   CO2 27 06/29/2021 1729   GLUCOSE 531 (HH) 06/29/2021 1806   BUN 26 (H) 06/29/2021 1806   CREATININE 1.00 06/29/2021 1806   CALCIUM 9.3 06/29/2021 1729   GFRNONAA >60 06/29/2021 1729    COAGS: Lab Results  Component Value Date   INR 1.0 02/02/2021     Non-Invasive Vascular Imaging:   Patient underwent CT angio abdomen pelvis with runoff which was independently reviewed.  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  Statin:  No. Beta Blocker:  No. Aspirin:  No. ACEI:  No. ARB:  Yes.   CCB use:  Yes Other antiplatelets/anticoagulants:  No.    ASSESSMENT/PLAN: This is a 60 y.o. male with right forefoot pain after traumatic removal of his toenail.  The patient has a palpable pulse in the posterior tibial artery.  There are no concerns for acute limb threat ischemia.  The wound happened 5 days ago, therefore, there is no concern for nonhealing at this time.  Patient would benefit from ankle-brachial index to define the severity of his peripheral vascular disease.  I will see the patient in 1 year for follow-up,  and he was asked to call our office if his wound does not heal in the coming weeks.  Please consider adding aspirin and statin to the patient's medication regimen to decrease his risk of cardiovascular morbidity mortality.   Fara Olden MD MS [facility] [phone] 06/29/2021  8:39 PM

## 2021-06-29 NOTE — ED Provider Notes (Signed)
Brett White Provider Note   CSN: [id number] Arrival date & time: 06/29/21  1646     History Chief Complaint  Patient presents with   Hypertension    Brett White is a 60 y.o. male hx of DM, left foot osteomyelitis status post amputation here presenting with right leg pain.  Patient is very poor historian because he has a history of vascular dementia.  He apparently has right leg pain for the last several days.  Patient states that he has been having right leg pain and right big toe pain for the last several days.  Patient is very vague with details.  He also has some headache and dizziness.  He apparently fell on 9/22 and did not wanted to be transported to the hospital.  Visiting nurse went to visit him several times.  He was noted to be hypertensive and glucose was elevated.  He apparently ran out of his meds for about a month or so.  The history is provided by the patient.      Past Medical History:  Diagnosis Date   Diabetes mellitus without complication [facility])     Patient Active Problem List   Diagnosis Date Noted   Acquired absence of left leg below knee (HCC) 04/05/2021   Acute osteomyelitis of left foot (HCC) 03/19/2021   Left foot infection 03/17/2021   Pain in left ankle and joints of left foot    Abscess of left foot 03/03/2021   Cutaneous abscess of left foot    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   Uncontrolled type 2 diabetes mellitus with hyperglycemia, without long-term current use of insulin (HCC) 02/03/2021   Essential hypertension 02/03/2021   Lactic acidosis 02/03/2021   Diabetic peripheral neuropathy associated with type 2 diabetes mellitus (HCC) 02/03/2021   Osteomyelitis (HCC) 02/03/2021   Cellulitis of toe of left foot     Past Surgical History:  Procedure Laterality Date   AMPUTATION Left 02/04/2021   Procedure: LEFT GREAT TOE AMPUTATION;  Surgeon: [name], MD;  Location: [facility] OR;   Service: Orthopedics;  Laterality: Left;   AMPUTATION Left 02/10/2021   Procedure: LEFT FOOT 1ST RAY AMPUTATION;  Surgeon: [name], MD;  Location: [facility] OR;  Service: Orthopedics;  Laterality: Left;   AMPUTATION Left 03/22/2021   Procedure: LEFT BELOW KNEE AMPUTATION;  Surgeon: [name], MD;  Location: [facility] OR;  Service: Orthopedics;  Laterality: Left;   APPENDECTOMY     BACK SURGERY     I & D EXTREMITY Left 03/03/2021   Procedure: LEFT FOOT DEBRIDEMENT;  Surgeon: [name], MD;  Location: [facility] OR;  Service: Orthopedics;  Laterality: Left;   TONSILLECTOMY         Family History  Problem Relation Age of Onset   Heart attack Neg Hx     Social History   Tobacco Use   Smoking status: Never   Smokeless tobacco: Never  Substance Use Topics   Alcohol use: Not Currently   Drug use: Not Currently    Home Medications Prior to Admission medications   Medication Sig Start Date End Date Taking? Authorizing Provider  DULoxetine (CYMBALTA) 30 MG capsule Take 30 mg by mouth daily.    [provider]  gabapentin (NEURONTIN) 300 MG capsule Take 1 capsule (300 mg total) by mouth 2 (two) times daily. 05/10/21 06/09/21  McDonald, Mia A, PA-C  insulin glargine (LANTUS) 100 UNIT/ML injection Inject 0.05 mLs (5 Units total)  into the skin 2 (two) times daily. 03/29/21   Marguerita Merles Latif, DO  losartan (COZAAR) 25 MG tablet Take 1 tablet (25 mg total) by mouth daily. 03/30/21   Marguerita Merles Latif, DO  metFORMIN (GLUCOPHAGE) 500 MG tablet Take 500 mg by mouth daily with breakfast.    [provider]  methocarbamol (ROBAXIN) 500 MG tablet Take 1 tablet (500 mg total) by mouth every 6 (six) hours as needed for muscle spasms. 02/17/21   Lanae Boast, MD  NOVOLOG FLEXPEN 100 UNIT/ML FlexPen Inject 5 Units into the skin with breakfast, with lunch, and with evening meal. 02/19/21   [provider]  oxyCODONE-acetaminophen (PERCOCET/ROXICET) 5-325 MG tablet Take 1 tablet by mouth  every 6 (six) hours as needed for severe pain. 06/01/21   Persons, [place], PA  pantoprazole (PROTONIX) 40 MG tablet Take 1 tablet (40 mg total) by mouth daily. 02/18/21   Lanae Boast, MD    Allergies    Patient has no known allergies.  Review of Systems   Review of Systems  Musculoskeletal:        R leg pain   Neurological:  Positive for headaches.  All other systems reviewed and are negative.  Physical Exam Updated Vital Signs BP (!) 175/112   Pulse 79   Resp 15   Ht 6\' 2"  (1.88 m)   Wt 95.3 kg   SpO2 100%   BMI 26.98 kg/m   Physical Exam Vitals and nursing note reviewed.  Constitutional:      Comments: Chronically ill  HENT:     Head: Normocephalic.     Nose: Nose normal.     Mouth/Throat:     Mouth: Mucous membranes are dry.  Eyes:     Extraocular Movements: Extraocular movements intact.     Pupils: Pupils are equal, round, and reactive to light.  Cardiovascular:     Rate and Rhythm: Normal rate and regular rhythm.     Pulses: Normal pulses.     Heart sounds: Normal heart sounds.  Pulmonary:     Effort: Pulmonary effort is normal.     Breath sounds: Normal breath sounds.  Abdominal:     General: Abdomen is flat.     Palpations: Abdomen is soft.  Musculoskeletal:     Cervical back: Normal range of motion and neck supple.     Comments: L BKA, I was unable to get any pulses in the right DP or PT.  Patient has faint dopplerable pulse on the right DP.  Patient is missing right big toe toenail.  Right foot is very cold  Skin:    General: Skin is warm.     Capillary Refill: Capillary refill takes less than 2 seconds.  Neurological:     General: No focal deficit present.     Mental Status: He is alert and oriented to person, place, and time.  Psychiatric:        Mood and Affect: Mood normal.        Behavior: Behavior normal.    ED Results / Procedures / Treatments   Labs (all labs ordered are listed, but only abnormal results are displayed) Labs Reviewed   COMPREHENSIVE METABOLIC PANEL - Abnormal; Notable for the following components:      Result Value   Glucose, Bld 520 (*)    BUN 23 (*)    All other components within normal limits  BLOOD GAS, VENOUS - Abnormal; Notable for the following components:   pO2, Ven <31.0 (*)  All other components within normal limits  I-STAT CHEM 8, ED - Abnormal; Notable for the following components:   BUN 26 (*)    Glucose, Bld 531 (*)    All other components within normal limits  CBG MONITORING, ED - Abnormal; Notable for the following components:   Glucose-Capillary 489 (*)    All other components within normal limits  CBG MONITORING, ED - Abnormal; Notable for the following components:   Glucose-Capillary 366 (*)    All other components within normal limits  RESP PANEL BY RT-PCR (FLU A&B, COVID) ARPGX2  CBC WITH DIFFERENTIAL/PLATELET  I-STAT BETA HCG BLOOD, ED ([facility], [facility], [facility] ONLY)  CBG MONITORING, ED  TROPONIN I (HIGH SENSITIVITY)  TROPONIN I (HIGH SENSITIVITY)    EKG EKG Interpretation  Date/Time:  Thursday June 29 2021 17:26:43 EDT Ventricular Rate:  93 PR Interval:  169 QRS Duration: 100 QT Interval:  363 QTC Calculation: 452 R Axis:   -27 Text Interpretation: Sinus rhythm Ventricular premature complex Borderline left axis deviation Anteroseptal infarct, old No significant change since last tracing Confirmed by Richardean Canal [phone]) on 06/29/2021 5:28:19 PM  Radiology DG Chest 1 View  Result Date: 06/29/2021 CLINICAL DATA:  Right foot pain. EXAM: CHEST  1 VIEW COMPARISON:  Chest x-ray 02/02/2021. FINDINGS: The heart size and mediastinal contours are within normal limits. Both lungs are clear. The visualized skeletal structures are unremarkable. IMPRESSION: No active disease. Electronically Signed   By: Darliss Cheney M.D.   On: 06/29/2021 18:57   CT HEAD WO CONTRAST ( )  Result Date: 06/29/2021 CLINICAL DATA:  Headache with intracranial hemorrhage suspected EXAM: CT HEAD WITHOUT  CONTRAST TECHNIQUE: Contiguous axial images were obtained from the base of the skull through the vertex without intravenous contrast. COMPARISON:  11/25/2020 FINDINGS: Brain: No evidence of acute infarction, hemorrhage, hydrocephalus, extra-axial collection or mass lesion/mass effect. Symmetric remote insult at the globus pallidus, usually toxic/metabolic with this pattern. There may have been remote, symmetric bilateral cerebellar insult with folia widening. Brain volume is normal. Vascular: No hyperdense vessel or unexpected calcification. Skull: Normal. Negative for fracture or focal lesion. Sinuses/Orbits: No acute finding. Other: Chronic patchy skin thickening and subcutaneous reticulation attributed to scarring. IMPRESSION: No acute finding or change from February 2022. Electronically Signed   By: Tiburcio Pea M.D.   On: 06/29/2021 19:16   CT Angio Aortobifemoral W and/or Wo Contrast  Result Date: 06/29/2021 CLINICAL DATA:  Right great toe pain, diabetes EXAM: CT ANGIOGRAPHY OF ABDOMINAL AORTA WITH ILIOFEMORAL RUNOFF TECHNIQUE: Multidetector CT imaging of the abdomen, pelvis and lower extremities was performed using the standard protocol during bolus administration of intravenous contrast. Multiplanar CT image reconstructions and MIPs were obtained to evaluate the vascular anatomy. CONTRAST:  OMNIPAQUE IOHEXOL 350 MG/ML SOLN COMPARISON:  06/29/2021 FINDINGS: VASCULAR Aorta: Normal caliber aorta without aneurysm, dissection, vasculitis or significant stenosis. Minimal atherosclerosis. Celiac: Patent without evidence of aneurysm, dissection, vasculitis or significant stenosis. SMA: Patent without evidence of aneurysm, dissection, vasculitis or significant stenosis. Renals: Both renal arteries are patent without evidence of aneurysm, dissection, vasculitis, fibromuscular dysplasia or significant stenosis. IMA: Patent without evidence of aneurysm, dissection, vasculitis or significant stenosis. RIGHT  Lower Extremity Inflow: Common, internal and external iliac arteries are patent without evidence of aneurysm, dissection, vasculitis or significant stenosis. Minimal atherosclerosis. Outflow: Common, superficial and profunda femoral arteries and the popliteal artery are patent without evidence of aneurysm, dissection, vasculitis or significant stenosis. Runoff: Trifurcation vessels are widely patent at their origins. The anterior  tibial and peroneal arteries are visualized to the level of the mid and distal calf, respectively, with likely chronic occlusion due to underlying atherosclerosis and small-vessel disease. The posterior tibial artery is widely patent throughout its course. LEFT Lower Extremity Inflow: Common, internal and external iliac arteries are patent without evidence of aneurysm, dissection, vasculitis or significant stenosis. Minimal atherosclerosis Outflow: Common, superficial and profunda femoral arteries and the popliteal artery are patent without evidence of aneurysm, dissection, vasculitis or significant stenosis. Runoff: There is a the left below-knee amputation. Veins: No obvious venous abnormality within the limitations of this arterial phase study. Review of the MIP images confirms the above findings. NON-VASCULAR Lower chest: No acute pleural or parenchymal lung disease. Hepatobiliary: No focal liver abnormality is seen. No gallstones, gallbladder wall thickening, or biliary dilatation. Pancreas: Unremarkable. No pancreatic ductal dilatation or surrounding inflammatory changes. Spleen: Normal in size without focal abnormality. Adrenals/Urinary Tract: Nonobstructing 9 mm calculus lower pole right kidney. Simple peripelvic left renal cyst. No obstructive uropathy within either kidney. The adrenals and bladder are unremarkable. Stomach/Bowel: No bowel obstruction or ileus. No bowel wall thickening or inflammatory change. Lymphatic: No pathologic adenopathy. Reproductive: Prostate is  unremarkable. Other: No free intraperitoneal fluid or free gas. Small fat containing umbilical hernia. No bowel herniation. Musculoskeletal: There are no acute or destructive bony lesions. Postsurgical changes are seen within the lower lumbar spine. Mild bilateral symmetrical osteoarthritis of the hips and knees. Left below-knee amputation. Diffuse osteoarthritis of the right foot. The small marginal erosion of the right first metatarsal head better visualized on corresponding x-ray. Reconstructed images demonstrate no additional findings. IMPRESSION: VASCULAR 1. 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 No evidence of underlying arterial embolus. 2. Prior left below-knee amputation. 3.  Aortic Atherosclerosis (ICD10-I70.0). NON-VASCULAR 1. Marked osteoarthritis of the right foot. Please refer to corresponding x-ray. 2. 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 3. Nonobstructing 9 mm right renal calculus. 4. Otherwise no acute intra-abdominal or intrapelvic process. Electronically Signed   By: Sharlet Salina M.D.   On: 06/29/2021 19:27   DG Foot Complete Right  Result Date: 06/29/2021 CLINICAL DATA:  Right great toe pain, swelling EXAM: RIGHT FOOT COMPLETE - 3+ VIEW COMPARISON:  None. FINDINGS: Frontal, oblique, and lateral views of the right foot are obtained. No acute fracture, subluxation, or dislocation. There is prominent joint space narrowing and osteophyte formation throughout the midfoot. Mild joint space narrowing and osteophyte formation throughout the metatarsophalangeal and interphalangeal joints. At the first MTP, there is overlying soft tissue swelling and a small marginal erosion of the first metatarsal head. Small superior and inferior calcaneal spurs. Soft tissues are otherwise unremarkable. IMPRESSION: 1. No acute fracture. 2. Diffuse  osteoarthritis. 3. Nonspecific marginal erosion of the first metatarsal head, with overlying soft tissue swelling. Please correlate with any symptoms or laboratory evidence of gout. Electronically Signed   By: Sharlet Salina M.D.   On: 06/29/2021 18:54    Procedures Procedures    Angiocath insertion Performed by: Richardean Canal  Consent: Verbal consent obtained. Risks and benefits: risks, benefits and alternatives were discussed Time out: Immediately prior to procedure a "time out" was called to verify the correct patient, procedure, equipment, support staff and site/side marked as required.  Preparation: Patient was prepped and draped in the usual sterile fashion.  Vein Location: L antecube   Ultrasound Guided  Gauge:  20 long   Normal blood return and flush without difficulty Patient tolerance: Patient tolerated the procedure well with no immediate complications.   Medications Ordered in ED Medications  HYDROmorphone (DILAUDID) injection 1 mg (has no administration in time range)  sodium chloride 0.9 % bolus 1,000 mL (1,000 mLs Intravenous Bolus 06/29/21 1751)  insulin aspart (novoLOG) injection 10 Units (10 Units Subcutaneous Given 06/29/21 1752)  morphine 4 MG/ML injection 4 mg (4 mg Intravenous Given 06/29/21 1857)  hydrALAZINE (APRESOLINE) injection 5 mg (5 mg Intravenous Given 06/29/21 1857)  iohexol (OMNIPAQUE) 350 MG/ML injection 100 mL (100 mLs Intravenous Contrast Given 06/29/21 1836)  doxycycline (VIBRA-TABS) tablet 100 mg (100 mg Oral Given 06/29/21 2026)  hydrALAZINE (APRESOLINE) injection 10 mg (10 mg Intravenous Given 06/29/21 2114)    ED Course  I have reviewed the triage vital signs and the nursing notes.  Pertinent labs & imaging results that were available during my care of the patient were reviewed by me and considered in my medical decision making (see chart for details).    MDM Rules/Calculators/A&P                           Rowan Blaker is a 60 y.o. male  here presenting with right foot pain and hypertension and hyperglycemia.  I do not see a good pulse and had only faint dopplerable pulse on the right DP.  Consulted Dr. Sherral Hammers from vascular.  He saw patient and states that patient does have a good right PT pulse.  Recommend ABI.  We will get labs and VBG to rule out DKA.  We will also get CT head as well.  9:20 PM CT showed chronic occlusion with collaterals. Dr. Sherral Hammers came to see patient and patient does have a PT pulse.  He recommend 1 year follow-up with vascular.  Patient states that he has some swelling in the buttock area.  I examined the area there is some erythema in the sacral area consistent with mild cellulitis.  Patient will be started on a course of doxycycline.  Patient is also hyperglycemic but is not in DKA.  After some fluids and insulin, his blood sugar went down to the 300s.  I also contacted case management and social work regarding medication management. They states that they will contact his home health agency tomorrow.  He states that he has meds in the pharmacy but he has nowhere to go and pick it up.  Case management will reach out to his home health agency tomorrow. Doesn't meet criteria for admission currently. Gave strict return precautions   Final Clinical Impression(s) / ED Diagnoses Final diagnoses:  None    Rx / DC Orders ED Discharge Orders     None        Charlynne Pander, MD 06/29/21 2131

## 2021-06-30 LAB — I-STAT BETA HCG BLOOD, ED (MC, WL, AP ONLY): I-stat hCG, quantitative: <5 m[IU]/mL (ref <5)

## 2021-08-14 ENCOUNTER — Telehealth: Payer: Self-pay | Admitting: Orthopedic Surgery

## 2021-08-14 NOTE — Telephone Encounter (Signed)
Pt also stated he would like it faxed to the [facility] on [address].

## 2021-08-14 NOTE — Telephone Encounter (Signed)
Pt called stating [facility] needs a referral number in order for him to be seen; he would like our office to send that and then give him a CB to update him.   [phone]

## 2021-08-15 NOTE — Telephone Encounter (Signed)
Order faxed to [facility]

## 2021-08-28 ENCOUNTER — Encounter: Payer: Medicare HMO | Admitting: Orthopedic Surgery

## 2021-10-03 ENCOUNTER — Inpatient Hospital Stay (HOSPITAL_COMMUNITY)
Admission: EM | Admit: 2021-10-03 | Discharge: 2021-10-20 | DRG: 854 | Disposition: A | Discharge status: Home Health | Payer: Medicare HMO | Source: Ambulatory Visit | Attending: Internal Medicine | Admitting: Internal Medicine

## 2021-10-03 ENCOUNTER — Emergency Department (HOSPITAL_COMMUNITY): Payer: Medicare HMO

## 2021-10-03 ENCOUNTER — Other Ambulatory Visit: Payer: Self-pay

## 2021-10-03 ENCOUNTER — Encounter (HOSPITAL_COMMUNITY): Payer: Self-pay

## 2021-10-03 DIAGNOSIS — E1165 Type 2 diabetes mellitus with hyperglycemia: Secondary | ICD-10-CM

## 2021-10-03 DIAGNOSIS — I10 Essential (primary) hypertension: Secondary | ICD-10-CM | POA: Diagnosis not present

## 2021-10-03 DIAGNOSIS — E871 Hypo-osmolality and hyponatremia: Secondary | ICD-10-CM | POA: Diagnosis present

## 2021-10-03 DIAGNOSIS — I493 Ventricular premature depolarization: Secondary | ICD-10-CM | POA: Diagnosis present

## 2021-10-03 DIAGNOSIS — Z608 Other problems related to social environment: Secondary | ICD-10-CM | POA: Diagnosis present

## 2021-10-03 DIAGNOSIS — Z8614 Personal history of Methicillin resistant Staphylococcus aureus infection: Secondary | ICD-10-CM

## 2021-10-03 DIAGNOSIS — L02212 Cutaneous abscess of back [any part, except buttock]: Secondary | ICD-10-CM | POA: Diagnosis not present

## 2021-10-03 DIAGNOSIS — E1142 Type 2 diabetes mellitus with diabetic polyneuropathy: Secondary | ICD-10-CM | POA: Diagnosis not present

## 2021-10-03 DIAGNOSIS — L0390 Cellulitis, unspecified: Secondary | ICD-10-CM | POA: Diagnosis not present

## 2021-10-03 DIAGNOSIS — E860 Dehydration: Secondary | ICD-10-CM | POA: Diagnosis present

## 2021-10-03 DIAGNOSIS — H538 Other visual disturbances: Secondary | ICD-10-CM | POA: Diagnosis present

## 2021-10-03 DIAGNOSIS — A4102 Sepsis due to Methicillin resistant Staphylococcus aureus: Principal | ICD-10-CM | POA: Diagnosis present

## 2021-10-03 DIAGNOSIS — Z794 Long term (current) use of insulin: Secondary | ICD-10-CM | POA: Diagnosis not present

## 2021-10-03 DIAGNOSIS — Z6826 Body mass index (BMI) 26.0-26.9, adult: Secondary | ICD-10-CM | POA: Diagnosis not present

## 2021-10-03 DIAGNOSIS — E119 Type 2 diabetes mellitus without complications: Secondary | ICD-10-CM | POA: Diagnosis not present

## 2021-10-03 DIAGNOSIS — R627 Adult failure to thrive: Secondary | ICD-10-CM | POA: Diagnosis present

## 2021-10-03 DIAGNOSIS — L03312 Cellulitis of back [any part except buttock]: Secondary | ICD-10-CM | POA: Diagnosis not present

## 2021-10-03 DIAGNOSIS — R112 Nausea with vomiting, unspecified: Secondary | ICD-10-CM

## 2021-10-03 DIAGNOSIS — F32A Depression, unspecified: Secondary | ICD-10-CM | POA: Diagnosis not present

## 2021-10-03 DIAGNOSIS — I69311 Memory deficit following cerebral infarction: Secondary | ICD-10-CM | POA: Diagnosis not present

## 2021-10-03 DIAGNOSIS — N189 Chronic kidney disease, unspecified: Secondary | ICD-10-CM | POA: Diagnosis present

## 2021-10-03 DIAGNOSIS — K5900 Constipation, unspecified: Secondary | ICD-10-CM | POA: Diagnosis not present

## 2021-10-03 DIAGNOSIS — Z89512 Acquired absence of left leg below knee: Secondary | ICD-10-CM | POA: Diagnosis not present

## 2021-10-03 DIAGNOSIS — I739 Peripheral vascular disease, unspecified: Secondary | ICD-10-CM | POA: Diagnosis not present

## 2021-10-03 DIAGNOSIS — Z20822 Contact with and (suspected) exposure to covid-19: Secondary | ICD-10-CM | POA: Diagnosis not present

## 2021-10-03 DIAGNOSIS — Z7984 Long term (current) use of oral hypoglycemic drugs: Secondary | ICD-10-CM | POA: Diagnosis not present

## 2021-10-03 DIAGNOSIS — N179 Acute kidney failure, unspecified: Secondary | ICD-10-CM | POA: Diagnosis present

## 2021-10-03 DIAGNOSIS — M25512 Pain in left shoulder: Secondary | ICD-10-CM

## 2021-10-03 DIAGNOSIS — Z79899 Other long term (current) drug therapy: Secondary | ICD-10-CM | POA: Diagnosis not present

## 2021-10-03 HISTORY — DX: Cerebral infarction, unspecified: I63.9

## 2021-10-03 LAB — COMPREHENSIVE METABOLIC PANEL
ALT: 11 U/L (ref 0–44)
AST: 14 U/L — ABNORMAL LOW (ref 15–41)
Albumin: 3.1 g/dL — ABNORMAL LOW (ref 3.5–5.0)
Alkaline Phosphatase: 108 U/L (ref 38–126)
Anion gap: 11 (ref 5–15)
BUN: 17 mg/dL (ref 6–20)
CO2: 22 mmol/L (ref 22–32)
Calcium: 8.8 mg/dL — ABNORMAL LOW (ref 8.9–10.3)
Chloride: 95 mmol/L — ABNORMAL LOW (ref 98–111)
Creatinine, Ser: 1.39 mg/dL — ABNORMAL HIGH (ref 0.61–1.24)
GFR, Estimated: 59 mL/min — ABNORMAL LOW (ref >60)
Glucose, Bld: 517 mg/dL (ref 70–99)
Potassium: 4.1 mmol/L (ref 3.5–5.1)
Sodium: 128 mmol/L — ABNORMAL LOW (ref 135–145)
Total Bilirubin: 1.1 mg/dL (ref 0.3–1.2)
Total Protein: 7.6 g/dL (ref 6.5–8.1)

## 2021-10-03 LAB — CBC WITH DIFFERENTIAL/PLATELET
Abs Immature Granulocytes: 0.07 10*3/uL (ref 0.00–0.07)
Basophils Absolute: 0.1 10*3/uL (ref 0.0–0.1)
Basophils Relative: 0 %
Eosinophils Absolute: 0 10*3/uL (ref 0.0–0.5)
Eosinophils Relative: 0 %
HCT: 42 % (ref 39.0–52.0)
Hemoglobin: 14 g/dL (ref 13.0–17.0)
Immature Granulocytes: 0 %
Lymphocytes Relative: 8 %
Lymphs Abs: 1.3 10*3/uL (ref 0.7–4.0)
MCH: 27.8 pg (ref 26.0–34.0)
MCHC: 33.3 g/dL (ref 30.0–36.0)
MCV: 83.3 fL (ref 80.0–100.0)
Monocytes Absolute: 1 10*3/uL (ref 0.1–1.0)
Monocytes Relative: 6 %
Neutro Abs: 14.3 10*3/uL — ABNORMAL HIGH (ref 1.7–7.7)
Neutrophils Relative %: 86 %
Platelets: 320 10*3/uL (ref 150–400)
RBC: 5.04 MIL/uL (ref 4.22–5.81)
RDW: 12.4 % (ref 11.5–15.5)
WBC: 16.6 10*3/uL — ABNORMAL HIGH (ref 4.0–10.5)
nRBC: 0 % (ref 0.0–0.2)

## 2021-10-03 LAB — CBG MONITORING, ED
Glucose-Capillary: 449 mg/dL — ABNORMAL HIGH (ref 70–99)
Glucose-Capillary: 316 mg/dL — ABNORMAL HIGH (ref 70–99)

## 2021-10-03 LAB — LACTIC ACID, PLASMA: Lactic Acid, Venous: 1.6 mmol/L (ref 0.5–1.9)

## 2021-10-03 LAB — APTT: aPTT: 24 seconds (ref 24–36)

## 2021-10-03 LAB — PROTIME-INR
INR: 1 (ref 0.8–1.2)
Prothrombin Time: 13.3 seconds (ref 11.4–15.2)

## 2021-10-03 MED ORDER — SODIUM CHLORIDE 0.9% FLUSH
10.0000 mL | Freq: Two times a day (BID) | INTRAVENOUS | Status: DC
  Administered 2021-10-03 – 2021-10-16 (×13): 10 mL

## 2021-10-03 MED ORDER — HYDROCODONE-ACETAMINOPHEN 5-325 MG PO TABS
1.0000 | ORAL_TABLET | Freq: Once | ORAL | Status: AC
Start: 2021-10-03 — End: 2021-10-03
  Administered 2021-10-03: 1 via ORAL
  Filled 2021-10-03: qty 1

## 2021-10-03 MED ORDER — LACTATED RINGERS IV BOLUS
1000.0000 mL | Freq: Once | INTRAVENOUS | Status: AC
  Administered 2021-10-03: 1000 mL via INTRAVENOUS

## 2021-10-03 MED ORDER — SODIUM CHLORIDE 0.9% FLUSH: 10.0000 mL | INTRAVENOUS | Status: DC | PRN

## 2021-10-03 MED ORDER — HYDROMORPHONE HCL 1 MG/ML IJ SOLN
1.0000 mg | Freq: Once | INTRAMUSCULAR | Status: AC
  Administered 2021-10-03: 1 mg via INTRAVENOUS
  Filled 2021-10-03: qty 1

## 2021-10-03 MED ORDER — ACETAMINOPHEN 325 MG PO TABS
650.0000 mg | ORAL_TABLET | Freq: Once | ORAL | Status: AC
  Administered 2021-10-03: 650 mg via ORAL
  Filled 2021-10-03: qty 2

## 2021-10-03 MED ORDER — LIDOCAINE-EPINEPHRINE (PF) 2 %-1:200000 IJ SOLN
20.0000 mL | Freq: Once | INTRAMUSCULAR | Status: AC
  Administered 2021-10-03: 20 mL
  Filled 2021-10-03: qty 20

## 2021-10-03 MED ORDER — OXYCODONE-ACETAMINOPHEN 5-325 MG PO TABS
2.0000 | ORAL_TABLET | Freq: Once | ORAL | Status: AC
  Administered 2021-10-03: 2 via ORAL
  Filled 2021-10-03: qty 2

## 2021-10-03 MED ORDER — INSULIN ASPART 100 UNIT/ML IJ SOLN
0.0000 [IU] | INTRAMUSCULAR | Status: DC
  Administered 2021-10-03: 15 [IU] via SUBCUTANEOUS

## 2021-10-03 MED ORDER — MORPHINE SULFATE (PF) 4 MG/ML IV SOLN
4.0000 mg | Freq: Once | INTRAVENOUS | Status: AC
  Administered 2021-10-03: 4 mg via INTRAVENOUS
  Filled 2021-10-03: qty 1

## 2021-10-03 MED ORDER — SODIUM CHLORIDE 0.9 % IV BOLUS: 1000.0000 mL | Freq: Once | INTRAVENOUS | Status: DC

## 2021-10-03 MED ORDER — ONDANSETRON 4 MG PO TBDP
4.0000 mg | ORAL_TABLET | Freq: Once | ORAL | Status: AC
  Administered 2021-10-03: 4 mg via ORAL
  Filled 2021-10-03: qty 1

## 2021-10-03 MED ORDER — DEXTROSE 5 % IV SOLN
1500.0000 mg | Freq: Once | INTRAVENOUS | Status: AC
  Administered 2021-10-03: 1500 mg via INTRAVENOUS
  Filled 2021-10-03: qty 75

## 2021-10-03 NOTE — ED Triage Notes (Addendum)
Pt BIB [facility] from drs office c/o an infected spider bite on left shoulder blade. Pt states he can't sleep or eat. When he tries to eat it causes him N/V/D.

## 2021-10-03 NOTE — Care Management (Signed)
ED CM contacted concerning patient needing a ride home patient discharged.  Ride set up with medical Benedetto Goad, [facility] received a call patient will not be discharged patient is now unstable, and will be admitted.  [employer] Cancelled via [employer].

## 2021-10-03 NOTE — ED Provider Notes (Signed)
Emergency Medicine Provider Triage Evaluation Note  Brett White , a 61 y.o. male  was evaluated in triage.  Pt complains of infection on his back.  History of type 2 diabetes, multiple amputations, MRSA.  Thinks he was bitten by a spider couple days ago, worsening pain and swelling on the back, sent in by PCP  Review of Systems  Positive: Abscess Negative: Fever  Physical Exam  BP (!) 154/95 (BP Location: Left Arm)    Pulse (!) 106    Temp 99.5 F (37.5 C) (Oral)    Resp 17    Ht 6\' 2"  (1.88 m)    Wt 95.3 kg    SpO2 96%    BMI 26.96 kg/m  Gen:   Awake, no distress   Resp:  Normal effort  MSK:   Moves extremities without difficulty  Other:  Large infected cellulitic abscess left upper back  Medical Decision Making  Medically screening exam initiated at 1:22 PM.  Appropriate orders placed.  Deuce Paternoster was informed that the remainder of the evaluation will be completed by another provider, this initial triage assessment does not replace that evaluation, and the importance of remaining in the ED until their evaluation is complete.  Diabetic male with infection, work-up initiated, pain medication given.   Jake Church, PA-C 10/03/21 1325    12/01/21, DO 10/04/21 [phone]

## 2021-10-03 NOTE — Discharge Instructions (Addendum)
Wet to Dry WOUND CARE for back wound: - Change dressing twice daily - Supplies: sterile saline, gauze, scissors, ABD pads, tape  Remove dressing and all packing carefully, moistening with sterile saline as needed to avoid packing/internal dressing sticking to the wound. 2.   Clean edges of skin around the wound with water/gauze, making sure there is no tape debris or leakage left on skin that could cause skin irritation or breakdown. 3.   Dampen and clean gauze with sterile saline and pack wound from wound base to skin level, making sure to take note of any possible areas of wound tracking, tunneling and packing appropriately. Wound can be packed loosely. 4.   Cover wound with a dry gauze and secure with tape.  5.   Write the date/time on the dry dressing/tape to better track when the last dressing change occurred. - change dressing as needed if leakage occurs, wound gets contaminated, or patient requests to shower. - You may shower daily with wound open and following the shower the wound should be dried and a clean dressing placed.  - Medical grade tape as well as packing supplies can be found at [employer] on [address] or [employer] on [address]. The remaining supplies can be found at your local drug store, [place] etc.

## 2021-10-03 NOTE — ED Notes (Signed)
Glucose 517- Abigail, PA notified

## 2021-10-03 NOTE — Progress Notes (Signed)

## 2021-10-03 NOTE — ED Notes (Signed)
Pt has an abscess on upper left back approx the size of a softball. The abscess is erythematous and hot to touch. The abscess has a green scabbed ctr. Pt states abscess is very painful

## 2021-10-03 NOTE — ED Provider Notes (Signed)
[facility] EMERGENCY DEPARTMENT Provider Note   CSN: [id number] Arrival date & time: 10/03/21  1229     History  Chief Complaint  Patient presents with   Wound Infection   Abscess    Brett White is a 61 y.o. male.  Patient with history of diabetes presents today with complaint of left shoulder wound.  He states that his wound first presented on Friday, he suspects he was bit by a spider, however he never saw a spider or felt anything bite him.  He endorses that the wound has become progressively more painful. He also endorses decreased oral intake and nausea/vomiting since Friday.  He endorses subjective fevers, has not checked his temperature.  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  The history is provided by the patient. No language interpreter was used.  Abscess Associated symptoms: no fever, no nausea and no vomiting       Home Medications Prior to Admission medications   Medication Sig Start Date End Date Taking? Authorizing Provider  aspirin 81 MG chewable tablet Chew 1 tablet (81 mg total) by mouth daily. 06/29/21   Drenda Freeze, MD  atorvastatin (LIPITOR) 10 MG tablet Take 1 tablet (10 mg total) by mouth daily. 06/29/21   Drenda Freeze, MD  doxycycline (VIBRAMYCIN) 100 MG capsule Take 1 capsule (100 mg total) by mouth 2 (two) times daily. One po bid x 7 days 06/29/21   Drenda Freeze, MD  DULoxetine (CYMBALTA) 30 MG capsule Take 30 mg by mouth daily.    [provider]  gabapentin (NEURONTIN) 300 MG capsule Take 1 capsule (300 mg total) by mouth 2 (two) times daily. 05/10/21 06/09/21  McDonald, Mia A, PA-C  insulin glargine (LANTUS) 100 UNIT/ML injection Inject 0.05 mLs (5 Units total) into the skin 2 (two) times daily. 03/29/21   Raiford Noble Latif, DO  losartan (COZAAR) 25 MG tablet Take 1 tablet (25 mg total) by mouth daily. 03/30/21   Raiford Noble Latif, DO  metFORMIN (GLUCOPHAGE)  500 MG tablet Take 500 mg by mouth daily with breakfast.    [provider]  methocarbamol (ROBAXIN) 500 MG tablet Take 1 tablet (500 mg total) by mouth every 6 (six) hours as needed for muscle spasms. 02/17/21   Antonieta Pert, MD  NOVOLOG FLEXPEN 100 UNIT/ML FlexPen Inject 5 Units into the skin with breakfast, with lunch, and with evening meal. 02/19/21   [provider]  oxyCODONE-acetaminophen (PERCOCET/ROXICET) 5-325 MG tablet Take 1 tablet by mouth every 6 (six) hours as needed for severe pain. 06/01/21   Persons, Bevely Palmer, PA  pantoprazole (PROTONIX) 40 MG tablet Take 1 tablet (40 mg total) by mouth daily. 02/18/21   Antonieta Pert, MD      Allergies    Patient has no known allergies.    Review of Systems   Review of Systems  Constitutional:  Negative for chills and fever.  Gastrointestinal:  Negative for diarrhea, nausea and vomiting.  Musculoskeletal:  Positive for back pain.  Skin:  Positive for wound.  All other systems reviewed and are negative.  Physical Exam Updated Vital Signs BP (!) 154/95 (BP Location: Left Arm)    Pulse (!) 106    Temp 99.5 F (37.5 C) (Oral)    Resp 17    Ht 6\' 2"  (1.88 m)    Wt 95.3 kg    SpO2 96%    BMI 26.96 kg/m  Physical Exam Vitals and nursing note reviewed.  Constitutional:      Appearance: Normal appearance. He is normal weight.     Comments: Patient resting comfortably in bed in no acute distress  HENT:     Head: Normocephalic and atraumatic.  Eyes:     Extraocular Movements: Extraocular movements intact.  Cardiovascular:     Rate and Rhythm: Normal rate and regular rhythm.     Heart sounds: Normal heart sounds.  Pulmonary:     Effort: Pulmonary effort is normal. No respiratory distress.     Breath sounds: Normal breath sounds.  Abdominal:     General: Abdomen is flat.     Palpations: Abdomen is soft.  Musculoskeletal:        General: Normal range of motion.     Cervical back: Normal range of motion.  Skin:    Comments: 4  cm x 4 cm fluctuant lesion noted to the left shoulder area with surrounding induration and erythema. See images below for further  Neurological:     General: No focal deficit present.     Mental Status: He is alert.  Psychiatric:        Mood and Affect: Mood normal.        Behavior: Behavior normal.     ED Results / Procedures / Treatments   Labs (all labs ordered are listed, but only abnormal results are displayed) Labs Reviewed  COMPREHENSIVE METABOLIC PANEL - Abnormal; Notable for the following components:      Result Value   Sodium 128 (*)    Chloride 95 (*)    Glucose, Bld 517 (*)    Creatinine, Ser 1.39 (*)    Calcium 8.8 (*)    Albumin 3.1 (*)    AST 14 (*)    GFR, Estimated 59 (*)    All other components within normal limits  CBC WITH DIFFERENTIAL/PLATELET - Abnormal; Notable for the following components:   WBC 16.6 (*)    Neutro Abs 14.3 (*)    All other components within normal limits  CULTURE, BLOOD (SINGLE)  LACTIC ACID, PLASMA  PROTIME-INR  APTT  LACTIC ACID, PLASMA  URINALYSIS, ROUTINE W REFLEX MICROSCOPIC    EKG None  Radiology DG Chest 2 View  Result Date: 10/03/2021 CLINICAL DATA:  Back infection. EXAM: CHEST - 2 VIEW COMPARISON:  June 29, 2021. FINDINGS: The heart size and mediastinal contours are within normal limits. Both lungs are clear. The visualized skeletal structures are unremarkable. IMPRESSION: No active cardiopulmonary disease. Electronically Signed   By: Marijo Conception M.D.   On: 10/03/2021 14:16    Procedures .Marland KitchenIncision and Drainage  Date/Time: 10/03/2021 9:19 PM Performed by: Bud Face, PA-C Authorized by: Bud Face, PA-C   Consent:    Consent obtained:  Verbal   Consent given by:  Patient   Risks, benefits, and alternatives were discussed: yes     Risks discussed:  Bleeding, incomplete drainage, pain, damage to other organs and infection   Alternatives discussed:  No treatment, delayed treatment, alternative  treatment and observation Universal protocol:    Procedure explained and questions answered to patient or proxy's satisfaction: yes     Imaging studies available: yes     Required blood products, implants, devices, and special equipment available: yes     Patient identity confirmed:  Verbally with patient Location:    Type:  Abscess   Size:  4 cm x 4 cm   Location:  Trunk   Trunk  location:  Back Pre-procedure details:    Skin preparation:  Povidone-iodine Anesthesia:    Anesthesia method:  Local infiltration   Local anesthetic:  Lidocaine 2% WITH epi Procedure type:    Complexity:  Simple Procedure details:    Wound management:  Probed and deloculated and extensive cleaning   Drainage:  Bloody and purulent   Drainage amount:  Moderate   Wound treatment:  Wound left open   Packing materials:  None Post-procedure details:    Procedure completion:  Tolerated well, no immediate complications    Medications Ordered in ED Medications  oxyCODONE-acetaminophen (PERCOCET/ROXICET) 5-325 MG per tablet 2 tablet (2 tablets Oral Given 10/03/21 1327)  ondansetron (ZOFRAN-ODT) disintegrating tablet 4 mg (4 mg Oral Given 10/03/21 1328)    ED Course/ Medical Decision Making/ A&P                           Medical Decision Making  This patient presents to the ED for concern of back abscess.   Co morbidities that complicate the patient evaluation  Poorly controlled diabetes     Lab Tests:  I Ordered, and personally interpreted labs.  The pertinent results include:  Lactic normal, glucose 517 with normal anion gap. Leukocytosis at 16.6. Cultures pending.   Imaging Studies ordered:  I ordered imaging studies including CXR  I independently visualized and interpreted imaging which showed no acute findings I agree with the radiologist interpretation     Consultations Obtained:  I requested consultation with the pharmacy,  and discussed lab and imaging findings as well as pertinent  plan - they recommend: IV dalvance for infection and sliding scale insulin     Dispostion:  Patients abscess thoroughly probed and deloculated, moderate amount of purulent drainage appreciated from wound. Patient given IV Dalvance and told to follow-up with his PCP in the next 48 hours for wound check. He is afebrile, non-toxic appearing, and in no acute distress with reassuring vital signs. Feel that his abscess has been adequately managed in the ER, strict return precautions given, patient expresses understanding.  Patient also with significant hyperglycemia, he states he is only on metformin for same. Given insulin in the ER this evening, no signs of DKA or HHS. Will recommend patient to see his PCP to further discuss his blood sugar control. He expresses understanding, states he plans to call his PCP tomorrow. Patient discharged in stable condition.   This is a shared visit with supervising physician Dr. Matilde Sprang who has independently evaluated patient & provided guidance in evaluation/management/disposition, in agreement with care    Final Clinical Impression(s) / ED Diagnoses Final diagnoses:  Abscess of upper back excluding scapular region    Rx / DC Orders ED Discharge Orders     None      An After Visit Summary was printed and given to the patient.    Nestor Lewandowsky 10/03/21 2127    Teressa Lower, MD 10/04/21 [phone]

## 2021-10-04 ENCOUNTER — Encounter (HOSPITAL_COMMUNITY): Payer: Self-pay | Admitting: Family Medicine

## 2021-10-04 ENCOUNTER — Inpatient Hospital Stay (HOSPITAL_COMMUNITY): Payer: Medicare HMO

## 2021-10-04 DIAGNOSIS — E119 Type 2 diabetes mellitus without complications: Secondary | ICD-10-CM | POA: Diagnosis not present

## 2021-10-04 DIAGNOSIS — E871 Hypo-osmolality and hyponatremia: Secondary | ICD-10-CM | POA: Diagnosis not present

## 2021-10-04 DIAGNOSIS — N179 Acute kidney failure, unspecified: Secondary | ICD-10-CM | POA: Diagnosis not present

## 2021-10-04 DIAGNOSIS — I739 Peripheral vascular disease, unspecified: Secondary | ICD-10-CM

## 2021-10-04 DIAGNOSIS — N189 Chronic kidney disease, unspecified: Secondary | ICD-10-CM | POA: Diagnosis present

## 2021-10-04 DIAGNOSIS — L0390 Cellulitis, unspecified: Secondary | ICD-10-CM

## 2021-10-04 DIAGNOSIS — Z89512 Acquired absence of left leg below knee: Secondary | ICD-10-CM

## 2021-10-04 DIAGNOSIS — Z794 Long term (current) use of insulin: Secondary | ICD-10-CM

## 2021-10-04 LAB — BASIC METABOLIC PANEL
Anion gap: 10 (ref 5–15)
BUN: 21 mg/dL — ABNORMAL HIGH (ref 6–20)
CO2: 25 mmol/L (ref 22–32)
Calcium: 8.5 mg/dL — ABNORMAL LOW (ref 8.9–10.3)
Chloride: 94 mmol/L — ABNORMAL LOW (ref 98–111)
Creatinine, Ser: 1.77 mg/dL — ABNORMAL HIGH (ref 0.61–1.24)
GFR, Estimated: 44 mL/min — ABNORMAL LOW (ref >60)
Glucose, Bld: 321 mg/dL — ABNORMAL HIGH (ref 70–99)
Potassium: 3.7 mmol/L (ref 3.5–5.1)
Sodium: 129 mmol/L — ABNORMAL LOW (ref 135–145)

## 2021-10-04 LAB — RESP PANEL BY RT-PCR (FLU A&B, COVID) ARPGX2
Influenza A by PCR: NEGATIVE
Influenza B by PCR: NEGATIVE
SARS Coronavirus 2 by RT PCR: NEGATIVE

## 2021-10-04 LAB — CBG MONITORING, ED
Glucose-Capillary: 224 mg/dL — ABNORMAL HIGH (ref 70–99)
Glucose-Capillary: 238 mg/dL — ABNORMAL HIGH (ref 70–99)
Glucose-Capillary: 307 mg/dL — ABNORMAL HIGH (ref 70–99)
Glucose-Capillary: 316 mg/dL — ABNORMAL HIGH (ref 70–99)
Glucose-Capillary: 317 mg/dL — ABNORMAL HIGH (ref 70–99)
Glucose-Capillary: 362 mg/dL — ABNORMAL HIGH (ref 70–99)

## 2021-10-04 LAB — CBC
HCT: 38 % — ABNORMAL LOW (ref 39.0–52.0)
Hemoglobin: 12.6 g/dL — ABNORMAL LOW (ref 13.0–17.0)
MCH: 27.9 pg (ref 26.0–34.0)
MCHC: 33.2 g/dL (ref 30.0–36.0)
MCV: 84.3 fL (ref 80.0–100.0)
Platelets: 290 10*3/uL (ref 150–400)
RBC: 4.51 MIL/uL (ref 4.22–5.81)
RDW: 12.5 % (ref 11.5–15.5)
WBC: 21.9 10*3/uL — ABNORMAL HIGH (ref 4.0–10.5)
nRBC: 0 % (ref 0.0–0.2)

## 2021-10-04 LAB — HEMOGLOBIN A1C
Hgb A1c MFr Bld: 12.2 % — ABNORMAL HIGH (ref 4.8–5.6)
Mean Plasma Glucose: 303.44 mg/dL

## 2021-10-04 IMAGING — CR DG ABDOMEN 1V
2 series · 2 of 2 positions shown · non-contrast
Comparison: None.

CLINICAL DATA: Nausea, vomiting, constipation.

EXAM:
ABDOMEN - 1 VIEW

[abdomen kub (1 of 2)]
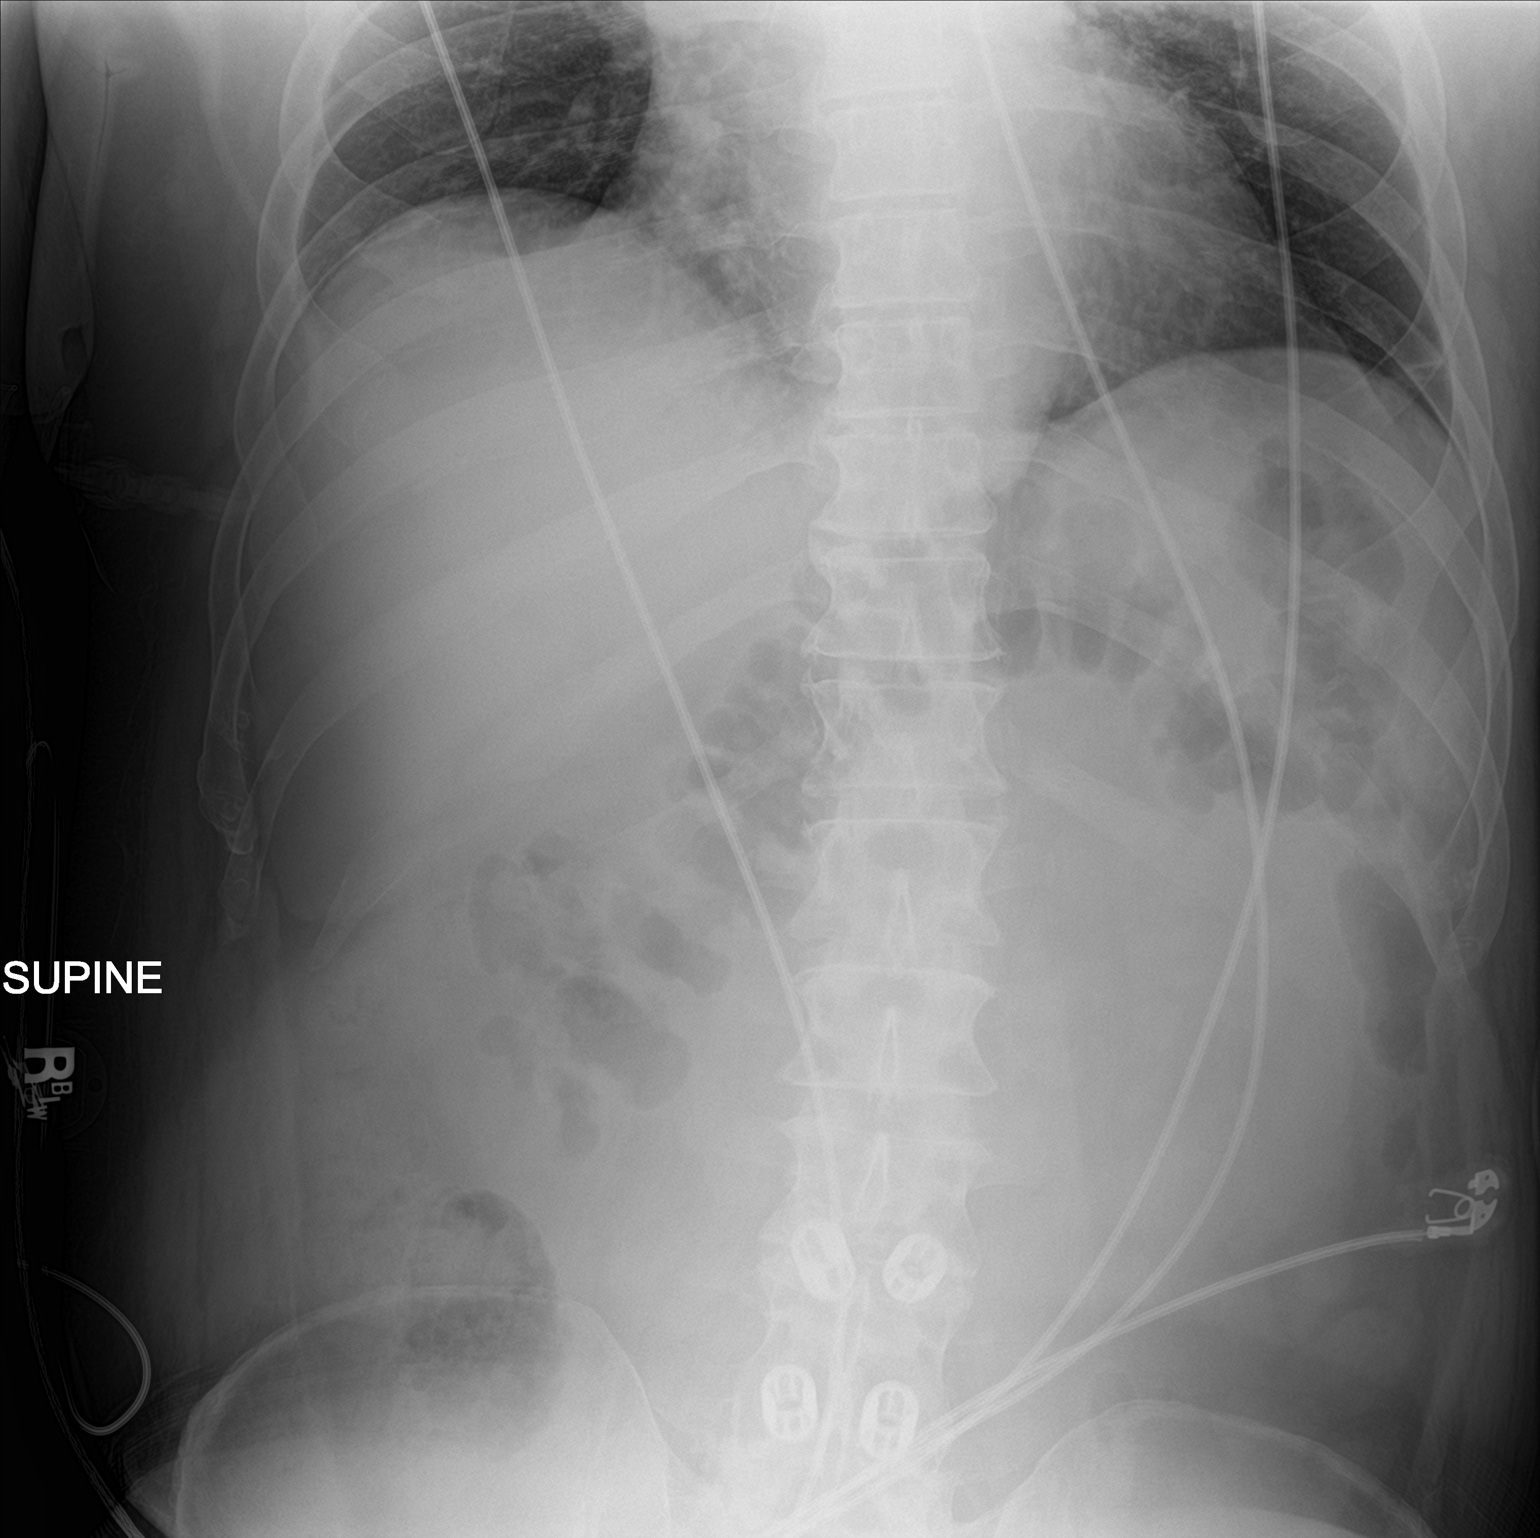

[abdomen kub (2 of 2)]
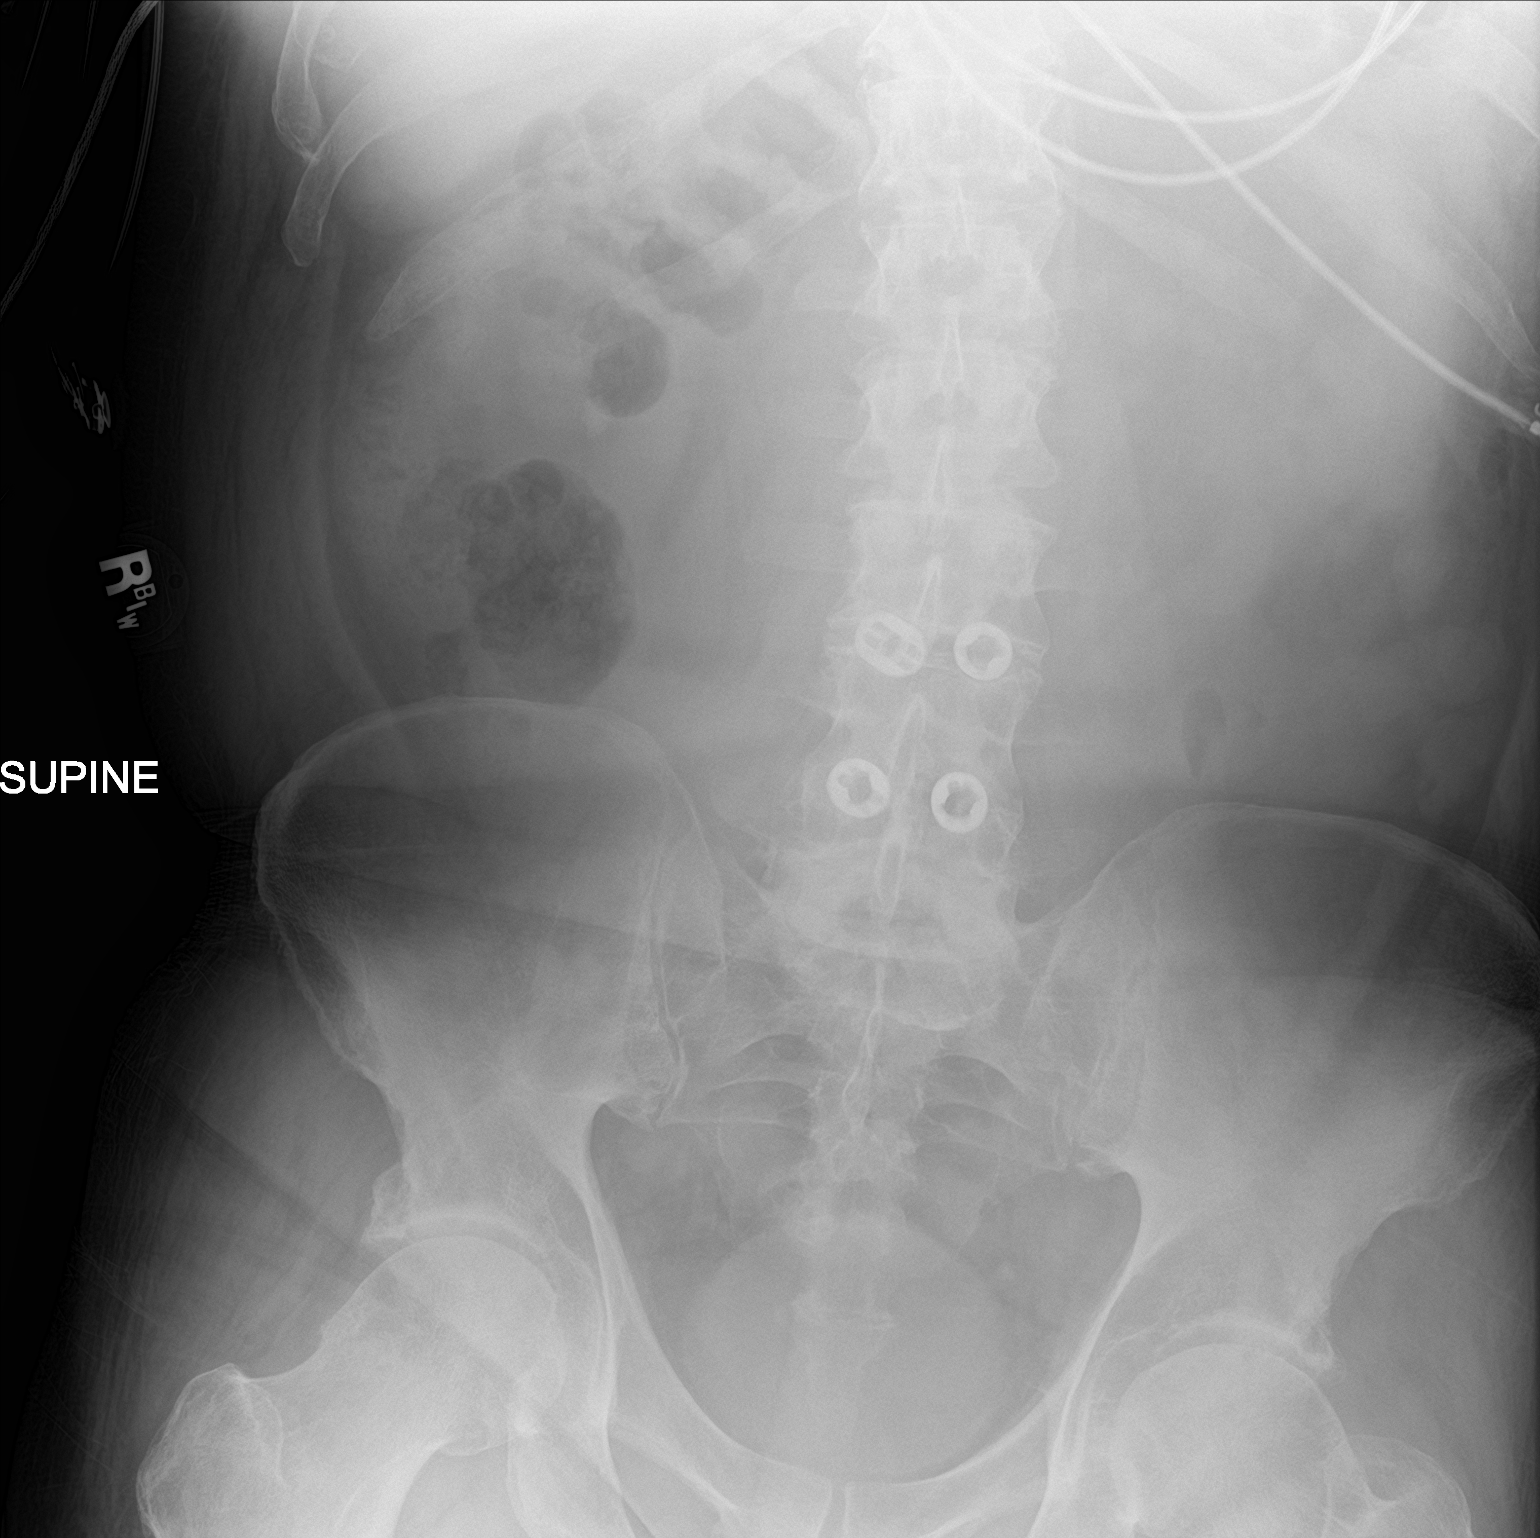

[2 of 2 positions shown; findings below may reference images not displayed]

FINDINGS: The bowel gas pattern is normal. Mild amount of stool seen in the
right colon. No radio-opaque calculi or other significant
radiographic abnormality are seen.
IMPRESSION: Mild stool burden.  No abnormal bowel dilatation.

## 2021-10-04 MED ORDER — ASPIRIN 81 MG PO CHEW
81.0000 mg | CHEWABLE_TABLET | Freq: Every day | ORAL | Status: DC
  Administered 2021-10-04 – 2021-10-20 (×17): 81 mg via ORAL
  Filled 2021-10-04 (×17): qty 1

## 2021-10-04 MED ORDER — ATORVASTATIN CALCIUM 10 MG PO TABS
10.0000 mg | ORAL_TABLET | Freq: Every day | ORAL | Status: DC
  Administered 2021-10-04 – 2021-10-20 (×17): 10 mg via ORAL
  Filled 2021-10-04 (×17): qty 1

## 2021-10-04 MED ORDER — ACETAMINOPHEN 650 MG RE SUPP: 650.0000 mg | Freq: Four times a day (QID) | RECTAL | Status: DC | PRN

## 2021-10-04 MED ORDER — HYDROMORPHONE HCL 1 MG/ML IJ SOLN
INTRAMUSCULAR | Status: AC
  Filled 2021-10-04: qty 1

## 2021-10-04 MED ORDER — SODIUM CHLORIDE 0.9% FLUSH
3.0000 mL | Freq: Two times a day (BID) | INTRAVENOUS | Status: DC
  Administered 2021-10-04 – 2021-10-19 (×22): 3 mL via INTRAVENOUS

## 2021-10-04 MED ORDER — SODIUM CHLORIDE 0.9 % IV SOLN: INTRAVENOUS | Status: DC

## 2021-10-04 MED ORDER — SENNOSIDES-DOCUSATE SODIUM 8.6-50 MG PO TABS
1.0000 | ORAL_TABLET | Freq: Two times a day (BID) | ORAL | Status: DC
  Administered 2021-10-04 – 2021-10-14 (×20): 1 via ORAL
  Filled 2021-10-04 (×22): qty 1

## 2021-10-04 MED ORDER — INSULIN ASPART 100 UNIT/ML IJ SOLN
0.0000 [IU] | INTRAMUSCULAR | Status: DC
  Administered 2021-10-04 (×2): 11 [IU] via SUBCUTANEOUS
  Administered 2021-10-04: 5 [IU] via SUBCUTANEOUS
  Administered 2021-10-04: 15 [IU] via SUBCUTANEOUS
  Administered 2021-10-04 – 2021-10-05 (×2): 5 [IU] via SUBCUTANEOUS
  Administered 2021-10-05: 11 [IU] via SUBCUTANEOUS
  Administered 2021-10-05: 2 [IU] via SUBCUTANEOUS
  Administered 2021-10-05: 17:00:00 11 [IU] via SUBCUTANEOUS
  Administered 2021-10-05: 04:00:00 2 [IU] via SUBCUTANEOUS
  Administered 2021-10-05: 13:00:00 5 [IU] via SUBCUTANEOUS
  Administered 2021-10-06: 2 [IU] via SUBCUTANEOUS
  Administered 2021-10-06: 3 [IU] via SUBCUTANEOUS
  Administered 2021-10-06: 8 [IU] via SUBCUTANEOUS
  Administered 2021-10-06: 3 [IU] via SUBCUTANEOUS
  Administered 2021-10-07: 11 [IU] via SUBCUTANEOUS
  Administered 2021-10-07 (×3): 3 [IU] via SUBCUTANEOUS
  Administered 2021-10-07: 5 [IU] via SUBCUTANEOUS

## 2021-10-04 MED ORDER — GABAPENTIN 300 MG PO CAPS
ORAL_CAPSULE | ORAL | Status: AC
  Filled 2021-10-04: qty 1

## 2021-10-04 MED ORDER — HYDROMORPHONE HCL 1 MG/ML IJ SOLN
1.0000 mg | INTRAMUSCULAR | Status: AC | PRN
  Administered 2021-10-04 (×2): 1 mg via INTRAVENOUS
  Filled 2021-10-04: qty 1

## 2021-10-04 MED ORDER — GABAPENTIN 300 MG PO CAPS
300.0000 mg | ORAL_CAPSULE | Freq: Two times a day (BID) | ORAL | Status: DC
  Administered 2021-10-04 – 2021-10-06 (×6): 300 mg via ORAL
  Filled 2021-10-04 (×5): qty 1

## 2021-10-04 MED ORDER — OXYCODONE HCL 5 MG PO TABS
5.0000 mg | ORAL_TABLET | ORAL | Status: DC | PRN
  Administered 2021-10-04 – 2021-10-06 (×8): 5 mg via ORAL
  Filled 2021-10-04 (×7): qty 1

## 2021-10-04 MED ORDER — OXYCODONE HCL 5 MG PO TABS
ORAL_TABLET | ORAL | Status: AC
  Filled 2021-10-04: qty 1

## 2021-10-04 MED ORDER — BISACODYL 5 MG PO TBEC: 5.0000 mg | DELAYED_RELEASE_TABLET | Freq: Every day | ORAL | Status: DC | PRN

## 2021-10-04 MED ORDER — ACETAMINOPHEN 325 MG PO TABS
650.0000 mg | ORAL_TABLET | ORAL | Status: DC | PRN
  Administered 2021-10-04 (×2): 650 mg via ORAL
  Filled 2021-10-04 (×2): qty 2

## 2021-10-04 MED ORDER — DULOXETINE HCL 30 MG PO CPEP
30.0000 mg | ORAL_CAPSULE | Freq: Every day | ORAL | Status: DC
  Administered 2021-10-04 – 2021-10-20 (×17): 30 mg via ORAL
  Filled 2021-10-04 (×17): qty 1

## 2021-10-04 MED ORDER — PANTOPRAZOLE SODIUM 40 MG PO TBEC
40.0000 mg | DELAYED_RELEASE_TABLET | Freq: Every day | ORAL | Status: DC
  Administered 2021-10-04 – 2021-10-20 (×17): 40 mg via ORAL
  Filled 2021-10-04 (×16): qty 1

## 2021-10-04 MED ORDER — ACETAMINOPHEN 325 MG PO TABS
ORAL_TABLET | ORAL | Status: AC
  Filled 2021-10-04: qty 1

## 2021-10-04 MED ORDER — ACETAMINOPHEN 325 MG PO TABS: 325.0000 mg | ORAL_TABLET | Freq: Once | ORAL | Status: DC

## 2021-10-04 MED ORDER — SENNOSIDES-DOCUSATE SODIUM 8.6-50 MG PO TABS: 1.0000 | ORAL_TABLET | Freq: Every evening | ORAL | Status: DC | PRN

## 2021-10-04 MED ORDER — ONDANSETRON HCL 4 MG/2ML IJ SOLN
4.0000 mg | Freq: Four times a day (QID) | INTRAMUSCULAR | Status: DC | PRN
  Administered 2021-10-06: 4 mg via INTRAVENOUS
  Filled 2021-10-04 (×2): qty 2

## 2021-10-04 MED ORDER — PNEUMOCOCCAL VAC POLYVALENT 25 MCG/0.5ML IJ INJ
0.5000 mL | INJECTION | INTRAMUSCULAR | Status: DC
  Filled 2021-10-04: qty 0.5

## 2021-10-04 MED ORDER — HYDROMORPHONE HCL 1 MG/ML IJ SOLN
0.5000 mg | INTRAMUSCULAR | Status: DC | PRN
  Administered 2021-10-04 – 2021-10-06 (×9): 0.5 mg via INTRAVENOUS
  Filled 2021-10-04 (×9): qty 0.5

## 2021-10-04 MED ORDER — HYDROMORPHONE HCL 1 MG/ML IJ SOLN
1.0000 mg | Freq: Once | INTRAMUSCULAR | Status: AC
  Administered 2021-10-04: 1 mg via INTRAVENOUS

## 2021-10-04 MED ORDER — INSULIN GLARGINE-YFGN 100 UNIT/ML ~~LOC~~ SOLN
20.0000 [IU] | Freq: Every day | SUBCUTANEOUS | Status: DC
  Administered 2021-10-04 – 2021-10-06 (×3): 20 [IU] via SUBCUTANEOUS
  Filled 2021-10-04 (×4): qty 0.2

## 2021-10-04 NOTE — ED Notes (Signed)
ED Provider at bedside. 

## 2021-10-04 NOTE — Progress Notes (Signed)
Inpatient Diabetes Program Recommendations  AACE/ADA: New Consensus Statement on Inpatient Glycemic Control (2015)  Target Ranges:  Prepandial:   less than 140 mg/dL      Peak postprandial:   less than 180 mg/dL (1-2 hours)      Critically ill patients:  140 - 180 mg/dL   Lab Results  Component Value Date   GLUCAP 238 (H) 10/04/2021   HGBA1C 12.2 (H) 10/04/2021    Review of Glycemic Control  Latest Reference Range & Units 10/03/21 18:21 10/03/21 20:13 10/04/21 00:15 10/04/21 02:12 10/04/21 08:45  Glucose-Capillary 70 - 99 mg/dL 226 (H) 333 (H) 545 (H) 317 (H) 238 (H)   Diabetes history: DM 2 Outpatient Diabetes medications: Metformin 500 mg daily, Lantus 5 units bid, Novolog 5 units tid with meals  Current orders for Inpatient glycemic control:  Novolog moderate q 4 hours  Inpatient Diabetes Program Recommendations:    Please add Semglee 20 units daily.   Thanks,  Beryl Meager, RN, BC-ADM Inpatient Diabetes Coordinator Pager [phone]  (8a-5p)

## 2021-10-04 NOTE — ED Notes (Signed)
Attempt to call report to unit, nurse not available at this time, message left to return call.

## 2021-10-04 NOTE — ED Notes (Signed)
Report to lisa, rn

## 2021-10-04 NOTE — ED Provider Notes (Signed)
Ultrasound ED Peripheral IV (Provider)  Date/Time: 10/04/2021 12:23 AM Performed by: Teressa Lower, MD Authorized by: Teressa Lower, MD   Procedure details:    Indications: poor IV access     Skin Prep: chlorhexidine gluconate     Location:  Left anterior forearm   Angiocath:  20 G   Bedside Ultrasound Guided: Yes     Patient tolerated procedure without complications: Yes      Jurline Folger, Debe Coder, MD 10/04/21 [phone]

## 2021-10-04 NOTE — ED Notes (Signed)
Report provided to unit, ready for transfer

## 2021-10-04 NOTE — Progress Notes (Addendum)
PROGRESS NOTE    Brett White  [id number] DOB: [birth year] DOA: 10/03/2021 PCP: System, Provider Not In    Chief Complaint  Patient presents with   Wound Infection   Abscess    Brief Narrative:  Brett White is a pleasant 61 y.o. 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  The patient reports noticing some pain and swelling near the left scapula approximately 2 days ago thought he may have been bit by a spider though he did not see any insect.  He has had some subjective fevers and chills associated with this.   Subjective:  Feeling dizzy when getting up N/v started Friday Denies ab pain Fever at home, fever here 102.8 C/o intermittent significant pain with cellulitis, asking for dilaudid   Reports memory issues , he appears to have memory issues not oriented to year/month, not able to name the hospital   Assessment & Plan:   Principal Problem:   Cellulitis Active Problems:   Uncontrolled type 2 diabetes mellitus with hyperglycemia (HCC)   Back abscess   AKI (acute kidney injury) ([place])  Abscess/cellulitis/sepsis, present on admission With fever, tachycardia, leukocytosis -Status post I&D left shoulder abscess, I do not see wound culture sent, blood culture in process, will add on MRSA screening -He received dalbavancin in the ED -If clinically does not improve will consider imaging    Insulin-dependent type 2 diabetes, uncontrolled, with hyperglycemia A1c 12.2 Reports can not afford insulin, only takes metformin at home Start insulin here, continue to adjust Diabetes RN consulted  Peripheral neuropathy Significantly decreased sensation right lower leg/foot  N/V Denies ab pain, will get kub, prn analgesics     AKI From dehydration? 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 He is able to give history but not able tell me the right month or year  Depression continue Cymbalta  FTT, appear to have poor social supports, reports pcp is in the process getting him to a SNF, will get PT eval and social worker consult     Body mass index is 26.96 kg/m.Marland Kitchen      Unresulted Labs (From admission, onward)     Start     Ordered   10/04/21 1846  Urinalysis, Routine w reflex microscopic  Once,   R        10/04/21 1845   10/04/21 1444  Urine rapid drug screen (hosp performed)  ONCE - STAT,   STAT        10/04/21 1443   10/04/21 0859  MRSA Next Gen by PCR, Nasal  Once,   R        10/04/21 0858   10/04/21 [id number]  Basic metabolic panel  Daily,   R      10/04/21 0107   10/04/21 0500  CBC  Daily,   R      10/04/21 0107              DVT prophylaxis: SCDs Start: 10/04/21 0105   Code Status: full Family  Communication: patient Disposition:   Status is: Inpatient  Dispo: The patient is from: home              Anticipated d/c is to: may need SNF              Anticipated d/c date is: TBD                Consultants:  none  Procedures:  I/D abscess in the ED  Antimicrobials:    Anti-infectives (From admission, onward)    Start     Dose/Rate Route Frequency Ordered Stop   10/03/21 1745  dalbavancin (DALVANCE) 1,500 mg in dextrose 5 % 500 mL IVPB        1,500 mg 967.7 mL/hr over 31 Minutes Intravenous Once 10/03/21 1736 10/03/21 2234          Objective: Vitals:   10/04/21 1115 10/04/21 1130 10/04/21 1400 10/04/21 1706  BP: 119/71 113/68  128/88 124/78  Pulse: 90 89 93 92  Resp: 20 15 11 15   Temp:      TempSrc:      SpO2: 99% 94% 97% 95%  Weight:      Height:       No intake or output data in the 24 hours ending 10/04/21 1850 Filed Weights   10/03/21 1317  Weight: 95.3 kg    Examination:  General exam: does not look comfortable, chronically ill appearing ,alert, awake, communicative Respiratory system: Clear to auscultation. Respiratory effort normal. Cardiovascular system:  RRR.  Gastrointestinal system: Abdomen is nondistended, soft and nontender.  Normal bowel sounds heard. Central nervous system: Alert and orientedx2, not able to tell me the right year /month.  Extremities:  s/p left BKA,  Skin: No rashes, lesions or ulcers Psychiatry: Judgement and insight appear normal. Mood & affect appropriate.     Data Reviewed: I have personally reviewed following labs and imaging studies  CBC: Recent Labs  Lab 10/03/21 1349 10/04/21 0218  WBC 16.6* 21.9*  NEUTROABS 14.3*  --   HGB 14.0 12.6*  HCT 42.0 38.0*  MCV 83.3 84.3  PLT 320 [id number]    Basic Metabolic Panel: Recent Labs  Lab 10/03/21 1349 10/04/21 0218  NA 128* 129*  K 4.1 3.7  CL 95* 94*  CO2 22 25  GLUCOSE 517* 321*  BUN 17 21*  CREATININE 1.39* 1.77*  CALCIUM 8.8* 8.5*    GFR: Estimated Creatinine Clearance: 52.9 mL/min (A) (by C-G formula based on SCr of 1.77 mg/dL (H)).  Liver Function Tests: Recent Labs  Lab 10/03/21 1349  AST 14*  ALT 11  ALKPHOS 108  BILITOT 1.1  PROT 7.6  ALBUMIN 3.1*    CBG: Recent Labs  Lab 10/04/21 0015 10/04/21 0212 10/04/21 0845 10/04/21 1144 10/04/21 1615  GLUCAP 307* 317* 238* 362* 316*     Recent Results (from the past 240 hour(s))  Blood culture (routine single)     Status: None (Preliminary result)   Collection Time: 10/03/21  1:49 PM   Specimen: Site Not Specified; Blood  Result Value Ref Range Status   Specimen Description SITE NOT SPECIFIED  Final   Special Requests   Final     BOTTLES DRAWN AEROBIC AND ANAEROBIC Blood Culture adequate volume   Culture   Final    NO GROWTH < 24 HOURS Performed at [facility], [place] [address] [place], [place] [address]    Report Status PENDING  Incomplete  Resp Panel by RT-PCR (Flu A&B, Covid) Nasopharyngeal  Swab     Status: None   Collection Time: 10/04/21 12:41 AM   Specimen: Nasopharyngeal Swab; Nasopharyngeal(NP) swabs in vial transport medium  Result Value Ref Range Status   SARS Coronavirus 2 by RT PCR NEGATIVE NEGATIVE Final    Comment: (NOTE) SARS-CoV-2 target nucleic acids are NOT DETECTED.  The SARS-CoV-2 RNA is generally detectable in upper respiratory specimens during the acute phase of infection. The lowest concentration of SARS-CoV-2 viral copies this assay can detect is 138 copies/mL. A negative result does not preclude SARS-Cov-2 infection and should not be used as the sole basis for treatment or other patient management decisions. A negative result may occur with  improper specimen collection/handling, submission of specimen other than nasopharyngeal swab, presence of viral mutation(s) within the areas targeted by this assay, and inadequate number of viral copies(<138 copies/mL). A negative result must be combined with clinical observations, patient history, and epidemiological information. The expected result is Negative.  Fact Sheet for Patients:  EntrepreneurPulse.com.au  Fact Sheet for Healthcare Providers:  IncredibleEmployment.be  This test is no t yet approved or cleared by the [place] FDA and  has been authorized for detection and/or diagnosis of SARS-CoV-2 by FDA under an Emergency Use Authorization (EUA). This EUA will remain  in effect (meaning this test can be used) for the duration of the COVID-19 declaration under Section 564(b)(1) of the Act, 21 U.S.C.section 360bbb-3(b)(1), unless the authorization is terminated  or revoked sooner.        Influenza A by PCR NEGATIVE NEGATIVE Final   Influenza B by PCR NEGATIVE NEGATIVE Final    Comment: (NOTE) The Xpert Xpress SARS-CoV-2/FLU/RSV plus assay is intended as an aid in the diagnosis of influenza from Nasopharyngeal swab specimens and should not be used as a sole basis for treatment. Nasal washings and aspirates are unacceptable for Xpert Xpress SARS-CoV-2/FLU/RSV testing.  Fact Sheet for Patients: EntrepreneurPulse.com.au  Fact Sheet for Healthcare Providers: IncredibleEmployment.be  This test is not yet approved or cleared by the [place] FDA and has been authorized for detection and/or diagnosis of SARS-CoV-2 by FDA under an Emergency Use Authorization (EUA). This EUA will remain in effect (meaning this test can be used) for the duration of the COVID-19 declaration under Section 564(b)(1) of the Act, 21 U.S.C. section 360bbb-3(b)(1), unless the authorization is terminated or revoked.  Performed at [facility], [place] [address] [place], [place] [address]          Radiology Studies: DG Chest 2 View  Result Date: 10/03/2021 CLINICAL DATA:  Back infection. EXAM: CHEST - 2 VIEW COMPARISON:  June 29, 2021. FINDINGS: The heart size and mediastinal contours are within normal limits. Both lungs are clear. The visualized skeletal structures are unremarkable. IMPRESSION: No active cardiopulmonary disease. Electronically Signed   By: Marijo Conception M.D.   On: 10/03/2021 14:16   DG Abd 1 View  Result Date: 10/04/2021 CLINICAL DATA:  Nausea, vomiting, constipation. EXAM: ABDOMEN - 1 VIEW COMPARISON:  None. FINDINGS: The bowel gas pattern is normal. Mild amount of stool seen in the right colon. No radio-opaque calculi or other significant radiographic abnormality are seen. IMPRESSION: Mild stool burden.  No abnormal bowel dilatation. Electronically Signed   By: Marijo Conception M.D.   On: 10/04/2021 15:12        Scheduled  Meds:  acetaminophen  325 mg Oral Once   aspirin  81 mg Oral Daily   atorvastatin  10 mg Oral Daily   DULoxetine  30 mg Oral Daily   gabapentin  300 mg Oral BID   insulin aspart  0-15 Units Subcutaneous Q4H   insulin glargine-yfgn  20 Units Subcutaneous QHS   pantoprazole  40 mg Oral Daily   senna-docusate  1 tablet Oral BID   sodium chloride flush  10-40 mL Intracatheter Q12H   sodium chloride flush  3 mL Intravenous Q12H   Continuous Infusions:  sodium chloride 100 mL/hr at 10/04/21 1010   sodium chloride Stopped (10/03/21 2150)     LOS: 1 day   Time spent: 31mins Greater than 50% of this time was spent in counseling, explanation of diagnosis, planning of further management, and coordination of care.   Voice Recognition Viviann Spare dictation system was used to create this note, attempts have been made to correct errors. Please contact the author with questions and/or clarifications.   [name] Reasons, MD PhD FACP [facility]  Available via Epic secure chat 7am-7pm for nonurgent issues Please page for urgent issues To page the attending provider between 7A-7P or the covering provider during after hours 7P-7A, please log into the web site [email or web address] and access using universal  password for that web site. If you do not have the password, please call the hospital operator.    10/04/2021, 6:50 PM

## 2021-10-04 NOTE — ED Notes (Signed)
ED TO INPATIENT HANDOFF REPORT  ED Nurse Name and Phone #: Torianne Laflam   S Name/Age/Gender Erlene Quan 61 y.o. male Room/Bed: 007C/007C  Code Status   Code Status: Full Code  Home/SNF/Other Home Patient oriented to: self, place, time, and situation Is this baseline? Yes   Triage Complete: Triage complete  Chief Complaint Cellulitis [L03.90]  Triage Note Pt BIB [facility] from drs office c/o an infected spider bite on left shoulder blade. Pt states he can't sleep or eat. When he tries to eat it causes him N/V/D.    Allergies No Known Allergies  Level of Care/Admitting Diagnosis ED Disposition     ED Disposition  Admit   Condition  --   Comment  Hospital Area: [place] [[id number]]  Level of Care: Progressive [102]  Admit to Progressive based on following criteria: MULTISYSTEM THREATS such as stable sepsis, metabolic/electrolyte imbalance with or without encephalopathy that is responding to early treatment.  May [facility] to [facility] if equivalent level of care is available:: Yes  Covid Evaluation: Asymptomatic Screening Protocol (No Symptoms)  Diagnosis: Cellulitis [id number]  Admitting Physician: Vianne Bulls [id number]  Attending Physician: Vianne Bulls [id number]  Estimated length of stay: past midnight tomorrow  Certification:: I certify this patient will need inpatient services for at least 2 midnights          B Medical/Surgery History Past Medical History:  Diagnosis Date   Diabetes mellitus without complication [facility])    Past Surgical History:  Procedure Laterality Date   AMPUTATION Left 02/04/2021   Procedure: LEFT GREAT TOE AMPUTATION;  Surgeon: Newt Minion, MD;  Location: [place];  Service: Orthopedics;  Laterality: Left;   AMPUTATION Left 02/10/2021   Procedure: LEFT FOOT 1ST RAY AMPUTATION;  Surgeon: Newt Minion, MD;  Location: [place];  Service: Orthopedics;  Laterality: Left;   AMPUTATION Left 03/22/2021    Procedure: LEFT BELOW KNEE AMPUTATION;  Surgeon: Newt Minion, MD;  Location: [place];  Service: Orthopedics;  Laterality: Left;   APPENDECTOMY     BACK SURGERY     I & D EXTREMITY Left 03/03/2021   Procedure: LEFT FOOT DEBRIDEMENT;  Surgeon: Newt Minion, MD;  Location: [place];  Service: Orthopedics;  Laterality: Left;   TONSILLECTOMY       A IV Location/Drains/Wounds Patient Lines/Drains/Airways Status     Active Line/Drains/Airways     Name Placement date Placement time Site Days   Midline Single Lumen 10/03/21 Right Brachial 8 cm 0 cm 10/03/21  2131  Brachial  1   Negative Pressure Wound Therapy Pretibial Left;Proximal 03/22/21  1700  --  196   Incision (Closed) 03/22/21 Leg Left 03/22/21  1651  -- 196            Intake/Output Last 24 hours No intake or output data in the 24 hours ending 10/04/21 2027  Labs/Imaging Results for orders placed or performed during the hospital encounter of 10/03/21 (from the past 48 hour(s))  Lactic acid, plasma     Status: None   Collection Time: 10/03/21  1:49 PM  Result Value Ref Range   Lactic Acid, Venous 1.6 0.5 - 1.9 mmol/L    Comment: Performed at [facility], [place] [address] [place], [place] [address]  Comprehensive metabolic panel     Status: Abnormal   Collection Time: 10/03/21  1:49 PM  Result Value Ref Range   Sodium 128 (L) 135 - 145 mmol/L  Potassium 4.1 3.5 - 5.1 mmol/L   Chloride 95 (L) 98 - 111 mmol/L   CO2 22 22 - 32 mmol/L   Glucose, Bld 517 (HH) 70 - 99 mg/dL    Comment: Glucose reference range applies only to samples taken after fasting for at least 8 hours. CRITICAL RESULT CALLED TO, READ BACK BY AND VERIFIED WITH: K.NEWNUM RN 1501 10/03/21 MCCORMICK K    BUN 17 6 - 20 mg/dL   Creatinine, Ser 1.39 (H) 0.61 - 1.24 mg/dL   Calcium 8.8 (L) 8.9 - 10.3 mg/dL   Total Protein 7.6 6.5 - 8.1 g/dL   Albumin 3.1 (L) 3.5 - 5.0 g/dL   AST 14 (L) 15 - 41 U/L   ALT 11 0 - 44 U/L   Alkaline Phosphatase 108 38 - 126  U/L   Total Bilirubin 1.1 0.3 - 1.2 mg/dL   GFR, Estimated 59 (L) >60 mL/min    Comment: (NOTE) Calculated using the CKD-EPI Creatinine Equation (2021)    Anion gap 11 5 - 15    Comment: Performed at [facility], [place] [address], [place], [place] [address]  CBC WITH DIFFERENTIAL     Status: Abnormal   Collection Time: 10/03/21  1:49 PM  Result Value Ref Range   WBC 16.6 (H) 4.0 - 10.5 K/uL   RBC 5.04 4.22 - 5.81 MIL/uL   Hemoglobin 14.0 13.0 - 17.0 g/dL   HCT 42.0 39.0 - 52.0 %   MCV 83.3 80.0 - 100.0 fL   MCH 27.8 26.0 - 34.0 pg   MCHC 33.3 30.0 - 36.0 g/dL   RDW 12.4 11.5 - 15.5 %   Platelets 320 150 - 400 K/uL   nRBC 0.0 0.0 - 0.2 %   Neutrophils Relative % 86 %   Neutro Abs 14.3 (H) 1.7 - 7.7 K/uL   Lymphocytes Relative 8 %   Lymphs Abs 1.3 0.7 - 4.0 K/uL   Monocytes Relative 6 %   Monocytes Absolute 1.0 0.1 - 1.0 K/uL   Eosinophils Relative 0 %   Eosinophils Absolute 0.0 0.0 - 0.5 K/uL   Basophils Relative 0 %   Basophils Absolute 0.1 0.0 - 0.1 K/uL   Immature Granulocytes 0 %   Abs Immature Granulocytes 0.07 0.00 - 0.07 K/uL    Comment: Performed at [facility] [address] [place], [place] [address]  Protime-INR     Status: None   Collection Time: 10/03/21  1:49 PM  Result Value Ref Range   Prothrombin Time 13.3 11.4 - 15.2 seconds   INR 1.0 0.8 - 1.2    Comment: (NOTE) INR goal varies based on device and disease states. Performed at [facility], [place] [address], [place], [place] [address]   APTT     Status: None   Collection Time: 10/03/21  1:49 PM  Result Value Ref Range   aPTT 24 24 - 36 seconds    Comment: Performed at [facility] [address] [place], [place] [address]  Blood culture (routine single)     Status: None (Preliminary result)   Collection Time: 10/03/21  1:49 PM   Specimen: Site Not Specified; Blood  Result Value Ref Range   Specimen Description SITE NOT SPECIFIED    Special Requests      BOTTLES DRAWN  AEROBIC AND ANAEROBIC Blood Culture adequate volume   Culture      NO GROWTH < 24 HOURS Performed at [facility],  [address], [place], [place] [address]    Report Status PENDING   CBG monitoring, ED     Status: Abnormal   Collection Time: 10/03/21  6:21 PM  Result Value Ref Range   Glucose-Capillary 449 (H) 70 - 99 mg/dL    Comment: Glucose reference range applies only to samples taken after fasting for at least 8 hours.  CBG monitoring, ED     Status: Abnormal   Collection Time: 10/03/21  8:13 PM  Result Value Ref Range   Glucose-Capillary 316 (H) 70 - 99 mg/dL    Comment: Glucose reference range applies only to samples taken after fasting for at least 8 hours.   Comment 1 Notify RN    Comment 2 Document in Chart   CBG monitoring, ED     Status: Abnormal   Collection Time: 10/04/21 12:15 AM  Result Value Ref Range   Glucose-Capillary 307 (H) 70 - 99 mg/dL    Comment: Glucose reference range applies only to samples taken after fasting for at least 8 hours.  Resp Panel by RT-PCR (Flu A&B, Covid) Nasopharyngeal Swab     Status: None   Collection Time: 10/04/21 12:41 AM   Specimen: Nasopharyngeal Swab; Nasopharyngeal(NP) swabs in vial transport medium  Result Value Ref Range   SARS Coronavirus 2 by RT PCR NEGATIVE NEGATIVE    Comment: (NOTE) SARS-CoV-2 target nucleic acids are NOT DETECTED.  The SARS-CoV-2 RNA is generally detectable in upper respiratory specimens during the acute phase of infection. The lowest concentration of SARS-CoV-2 viral copies this assay can detect is 138 copies/mL. A negative result does not preclude SARS-Cov-2 infection and should not be used as the sole basis for treatment or other patient management decisions. A negative result may occur with  improper specimen collection/handling, submission of specimen other than nasopharyngeal swab, presence of viral mutation(s) within the areas targeted by this assay, and inadequate number of  viral copies(<138 copies/mL). A negative result must be combined with clinical observations, patient history, and epidemiological information. The expected result is Negative.  Fact Sheet for Patients:  EntrepreneurPulse.com.au  Fact Sheet for Healthcare Providers:  IncredibleEmployment.be  This test is no t yet approved or cleared by the [place] FDA and  has been authorized for detection and/or diagnosis of SARS-CoV-2 by FDA under an Emergency Use Authorization (EUA). This EUA will remain  in effect (meaning this test can be used) for the duration of the COVID-19 declaration under Section 564(b)(1) of the Act, 21 U.S.C.section 360bbb-3(b)(1), unless the authorization is terminated  or revoked sooner.       Influenza A by PCR NEGATIVE NEGATIVE   Influenza B by PCR NEGATIVE NEGATIVE    Comment: (NOTE) The Xpert Xpress SARS-CoV-2/FLU/RSV plus assay is intended as an aid in the diagnosis of influenza from Nasopharyngeal swab specimens and should not be used as a sole basis for treatment. Nasal washings and aspirates are unacceptable for Xpert Xpress SARS-CoV-2/FLU/RSV testing.  Fact Sheet for Patients: EntrepreneurPulse.com.au  Fact Sheet for Healthcare Providers: IncredibleEmployment.be  This test is not yet approved or cleared by the [place] FDA and has been authorized for detection and/or diagnosis of SARS-CoV-2 by FDA under an Emergency Use Authorization (EUA). This EUA will remain in effect (meaning this test can be used) for the duration of the COVID-19 declaration under Section 564(b)(1) of the Act, 21 U.S.C. section 360bbb-3(b)(1), unless the authorization is terminated or revoked.  Performed at [facility], [place] [address] [place], [place]  [id number]   CBG monitoring, ED     Status: Abnormal   Collection Time: 10/04/21  2:12 AM  Result Value Ref Range   Glucose-Capillary 317  (H) 70 - 99 mg/dL    Comment: Glucose reference range applies only to samples taken after fasting for at least 8 hours.  Hemoglobin A1c     Status: Abnormal   Collection Time: 10/04/21  2:18 AM  Result Value Ref Range   Hgb A1c MFr Bld 12.2 (H) 4.8 - 5.6 %    Comment: (NOTE) Pre diabetes:          5.7%-6.4%  Diabetes:              >6.4%  Glycemic control for   <7.0% adults with diabetes    Mean Plasma Glucose 303.44 mg/dL    Comment: Performed at [facility] [address], [place], [place] [id number]  Basic metabolic panel     Status: Abnormal   Collection Time: 10/04/21  2:18 AM  Result Value Ref Range   Sodium 129 (L) 135 - 145 mmol/L   Potassium 3.7 3.5 - 5.1 mmol/L   Chloride 94 (L) 98 - 111 mmol/L   CO2 25 22 - 32 mmol/L   Glucose, Bld 321 (H) 70 - 99 mg/dL    Comment: Glucose reference range applies only to samples taken after fasting for at least 8 hours.   BUN 21 (H) 6 - 20 mg/dL   Creatinine, Ser 1.77 (H) 0.61 - 1.24 mg/dL   Calcium 8.5 (L) 8.9 - 10.3 mg/dL   GFR, Estimated 44 (L) >60 mL/min    Comment: (NOTE) Calculated using the CKD-EPI Creatinine Equation (2021)    Anion gap 10 5 - 15    Comment: Performed at [facility] [address] [place], [place] [address]  CBC     Status: Abnormal   Collection Time: 10/04/21  2:18 AM  Result Value Ref Range   WBC 21.9 (H) 4.0 - 10.5 K/uL   RBC 4.51 4.22 - 5.81 MIL/uL   Hemoglobin 12.6 (L) 13.0 - 17.0 g/dL   HCT 38.0 (L) 39.0 - 52.0 %   MCV 84.3 80.0 - 100.0 fL   MCH 27.9 26.0 - 34.0 pg   MCHC 33.2 30.0 - 36.0 g/dL   RDW 12.5 11.5 - 15.5 %   Platelets 290 150 - 400 K/uL   nRBC 0.0 0.0 - 0.2 %    Comment: Performed at [facility], [place] [address], [place], [place] [address]  CBG monitoring, ED     Status: Abnormal   Collection Time: 10/04/21  8:45 AM  Result Value Ref Range   Glucose-Capillary 238 (H) 70 - 99 mg/dL    Comment: Glucose reference range applies only to samples taken after  fasting for at least 8 hours.  CBG monitoring, ED     Status: Abnormal   Collection Time: 10/04/21 11:44 AM  Result Value Ref Range   Glucose-Capillary 362 (H) 70 - 99 mg/dL    Comment: Glucose reference range applies only to samples taken after fasting for at least 8 hours.  CBG monitoring, ED     Status: Abnormal   Collection Time: 10/04/21  4:15 PM  Result Value Ref Range   Glucose-Capillary 316 (H) 70 - 99 mg/dL    Comment: Glucose reference range applies only to samples taken after fasting for at least 8 hours.  CBG monitoring, ED     Status: Abnormal  Collection Time: 10/04/21  8:15 PM  Result Value Ref Range   Glucose-Capillary 224 (H) 70 - 99 mg/dL    Comment: Glucose reference range applies only to samples taken after fasting for at least 8 hours.   DG Chest 2 View  Result Date: 10/03/2021 CLINICAL DATA:  Back infection. EXAM: CHEST - 2 VIEW COMPARISON:  June 29, 2021. FINDINGS: The heart size and mediastinal contours are within normal limits. Both lungs are clear. The visualized skeletal structures are unremarkable. IMPRESSION: No active cardiopulmonary disease. Electronically Signed   By: Marijo Conception M.D.   On: 10/03/2021 14:16   DG Abd 1 View  Result Date: 10/04/2021 CLINICAL DATA:  Nausea, vomiting, constipation. EXAM: ABDOMEN - 1 VIEW COMPARISON:  None. FINDINGS: The bowel gas pattern is normal. Mild amount of stool seen in the right colon. No radio-opaque calculi or other significant radiographic abnormality are seen. IMPRESSION: Mild stool burden.  No abnormal bowel dilatation. Electronically Signed   By: Marijo Conception M.D.   On: 10/04/2021 15:12    Pending Labs Unresulted Labs (From admission, onward)     Start     Ordered   10/05/21 0500  Magnesium  Tomorrow morning,   R        10/04/21 1857   10/05/21 0500  C-reactive protein  Tomorrow morning,   R        10/04/21 1857   10/05/21 0500  Sedimentation rate  Tomorrow morning,   R        10/04/21 1857    10/04/21 1846  Urinalysis, Routine w reflex microscopic  Once,   R        10/04/21 1845   10/04/21 1444  Urine rapid drug screen (hosp performed)  ONCE - STAT,   STAT        10/04/21 1443   10/04/21 0859  MRSA Next Gen by PCR, Nasal  Once,   R        10/04/21 0858   10/04/21 [id number]  Basic metabolic panel  Daily,   R      10/04/21 0107   10/04/21 0500  CBC  Daily,   R      10/04/21 0107            Vitals/Pain Today's Vitals   10/04/21 1400 10/04/21 1706 10/04/21 1707 10/04/21 2015  BP: 128/88 124/78  109/77  Pulse: 93 92  100  Resp: 11 15  (!) 24  Temp:      TempSrc:      SpO2: 97% 95%  94%  Weight:      Height:      PainSc:  4  4      Isolation Precautions No active isolations  Medications Medications  sodium chloride 0.9 % bolus 1,000 mL (0 mLs Intravenous Stopped 10/03/21 2150)  sodium chloride flush (NS) 0.9 % injection 10-40 mL (10 mLs Intracatheter Given 10/04/21 1017)  sodium chloride flush (NS) 0.9 % injection 10-40 mL (has no administration in time range)  acetaminophen (TYLENOL) tablet 325 mg (0 mg Oral Hold 10/04/21 0137)  aspirin chewable tablet 81 mg (81 mg Oral Given 10/04/21 1014)  atorvastatin (LIPITOR) tablet 10 mg (10 mg Oral Given 10/04/21 1013)  DULoxetine (CYMBALTA) DR capsule 30 mg (30 mg Oral Given 10/04/21 1108)  pantoprazole (PROTONIX) EC tablet 40 mg (40 mg Oral Given 10/04/21 1108)  gabapentin (NEURONTIN) capsule 300 mg (300 mg Oral Given 10/04/21 1019)  insulin aspart (novoLOG) injection 0-15 Units (11 Units  Subcutaneous Given 10/04/21 1618)  sodium chloride flush (NS) 0.9 % injection 3 mL (3 mLs Intravenous Not Given 10/04/21 1018)  acetaminophen (TYLENOL) tablet 650 mg (650 mg Oral Given 10/04/21 1618)    Or  acetaminophen (TYLENOL) suppository 650 mg ( Rectal See Alternative 10/04/21 1618)  oxyCODONE (Oxy IR/ROXICODONE) immediate release tablet 5 mg (5 mg Oral Given 10/04/21 1617)  senna-docusate (Senokot-S) tablet 1 tablet (has no administration in time range)   bisacodyl (DULCOLAX) EC tablet 5 mg (has no administration in time range)  acetaminophen (TYLENOL) 325 MG tablet (has no administration in time range)  HYDROmorphone (DILAUDID) 1 MG/ML injection (has no administration in time range)  gabapentin (NEURONTIN) 300 MG capsule (has no administration in time range)  oxyCODONE (Oxy IR/ROXICODONE) 5 MG immediate release tablet (has no administration in time range)  HYDROmorphone (DILAUDID) 1 MG/ML injection (has no administration in time range)  0.9 %  sodium chloride infusion ( Intravenous New Bag/Given 10/04/21 1010)  ondansetron (ZOFRAN) injection 4 mg (has no administration in time range)  HYDROmorphone (DILAUDID) injection 0.5 mg (has no administration in time range)  senna-docusate (Senokot-S) tablet 1 tablet (1 tablet Oral Given 10/04/21 1516)  insulin glargine-yfgn (SEMGLEE) injection 20 Units (has no administration in time range)  oxyCODONE-acetaminophen (PERCOCET/ROXICET) 5-325 MG per tablet 2 tablet (2 tablets Oral Given 10/03/21 1327)  ondansetron (ZOFRAN-ODT) disintegrating tablet 4 mg (4 mg Oral Given 10/03/21 1328)  lidocaine-EPINEPHrine (XYLOCAINE W/EPI) 2 %-1:200000 (PF) injection 20 mL (20 mLs Infiltration Given by Other 10/03/21 1936)  dalbavancin (DALVANCE) 1,500 mg in dextrose 5 % 500 mL IVPB (0 mg Intravenous Stopped 10/03/21 2234)  HYDROmorphone (DILAUDID) injection 1 mg (1 mg Intravenous Given 10/03/21 1935)  HYDROcodone-acetaminophen (NORCO/VICODIN) 5-325 MG per tablet 1 tablet (1 tablet Oral Given 10/03/21 2228)  lactated ringers bolus 1,000 mL (0 mLs Intravenous Stopped 10/04/21 0058)  morphine 4 MG/ML injection 4 mg (4 mg Intravenous Given 10/03/21 2252)  acetaminophen (TYLENOL) tablet 650 mg (650 mg Oral Given 10/03/21 2307)  HYDROmorphone (DILAUDID) injection 1 mg (1 mg Intravenous Given 10/04/21 0135)  HYDROmorphone (DILAUDID) injection 1 mg (1 mg Intravenous Given 10/04/21 1013)    Mobility walks with person assist Low fall risk   Focused  Assessments Cardiac Assessment Handoff:  Cardiac Rhythm: Normal sinus rhythm No results found for: CKTOTAL, CKMB, CKMBINDEX, TROPONINI No results found for: DDIMER Does the Patient currently have chest pain? No    R Recommendations: See Admitting Provider Note  Report given to:   Additional Notes: none

## 2021-10-04 NOTE — ED Notes (Signed)
Glendell Docker friend [phone] requesting an update on the patient

## 2021-10-04 NOTE — Plan of Care (Signed)
°  Problem: Education: Goal: Knowledge of General Education information will improve Description: Including pain rating scale, medication(s)/side effects and non-pharmacologic comfort measures Outcome: Progressing   Problem: Health Behavior/Discharge Planning: Goal: Ability to manage health-related needs will improve Outcome: Progressing   Problem: Clinical Measurements: Goal: Ability to maintain clinical measurements within normal limits will improve Outcome: Progressing   Problem: Activity: Goal: Risk for activity intolerance will decrease Outcome: Progressing   Problem: Coping: Goal: Level of anxiety will decrease Outcome: Progressing   Problem: Elimination: Goal: Will not experience complications related to bowel motility Outcome: Progressing   Problem: Pain Managment: Goal: General experience of comfort will improve Outcome: Not Progressing   Problem: Safety: Goal: Ability to remain free from injury will improve Outcome: Progressing   Problem: Skin Integrity: Goal: Risk for impaired skin integrity will decrease Outcome: Progressing

## 2021-10-04 NOTE — Progress Notes (Signed)
TR[facility] night cross cover note:  I was contacted by RN requesting clarification on current n.p.o. status.  It appears that the patient has already undergone I&D of abscess, and that initial blood sugar of 450 has improved into the low 300s.  Per chart review, no overt plan for impending procedure or additional overt contraindication to initiation of diet at this time. Consequently, I have placed order for carb modified diet to be started at this time.    Newton Pigg, DO Hospitalist

## 2021-10-04 NOTE — ED Notes (Signed)
Attempt to call report to inpatient nurse, no answer at number assigned to [facility].

## 2021-10-04 NOTE — H&P (Addendum)
History and Physical    Brett White [id number] DOB: 22-Jun-1961 DOA: 10/03/2021  PCP: System, Provider Not In   Patient coming from: Home   Chief Complaint: Pain and swelling at upper back on left   HPI: Brett White is a pleasant 61 y.o. 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  The patient reports noticing some pain and swelling near the left scapula approximately 2 days ago thought he may have been bit by a spider though he did not see any insect.  He has had some subjective fevers and chills associated with this.   ED Course: Upon arrival to the ED, patient is found to be febrile 39.3 C, saturating mid 90s on room air, transiently tachypneic and mildly tachycardic, and with stable blood pressure.  EKG features sinus tachycardia with rate 108 and frequent PVCs.  Chest x-ray negative for acute cardiopulmonary disease.  Chemistry panel notable for glucose 517, creatinine 1.39, normal bicarbonate, and normal anion gap.  CBC with leukocytosis to 16,600.  Lactic acid is 1.6.  Blood culture was collected, 2 L of IV fluids ordered, I&D performed, analgesics administered, and dalbavancin given in the ED.  Review of Systems:  All other systems reviewed and apart from HPI, are negative.  Past Medical History:  Diagnosis Date   Diabetes mellitus without complication [facility])     Past Surgical History:  Procedure Laterality Date   AMPUTATION Left 02/04/2021   Procedure: LEFT GREAT TOE AMPUTATION;  Surgeon: Newt Minion, MD;  Location: [place];  Service: Orthopedics;  Laterality: Left;   AMPUTATION Left 02/10/2021   Procedure: LEFT FOOT 1ST RAY AMPUTATION;  Surgeon: Newt Minion, MD;  Location: [place];  Service: Orthopedics;  Laterality: Left;    AMPUTATION Left 03/22/2021   Procedure: LEFT BELOW KNEE AMPUTATION;  Surgeon: Newt Minion, MD;  Location: [place];  Service: Orthopedics;  Laterality: Left;   APPENDECTOMY     BACK SURGERY     I & D EXTREMITY Left 03/03/2021   Procedure: LEFT FOOT DEBRIDEMENT;  Surgeon: Newt Minion, MD;  Location: [place];  Service: Orthopedics;  Laterality: Left;   TONSILLECTOMY      Social History:   reports that he has never smoked. He has never used smokeless tobacco. He reports that he does not currently use alcohol. He reports that he does not currently use drugs.  No Known Allergies  Family History  Problem Relation Age of Onset   Heart attack Neg Hx      Prior to Admission medications   Medication Sig Start Date End Date Taking? Authorizing Provider  aspirin 81 MG chewable tablet Chew 1 tablet (81 mg total) by mouth daily. 06/29/21  Yes Drenda Freeze, MD  atorvastatin (LIPITOR) 10 MG tablet Take 1 tablet (10 mg total) by mouth daily. 06/29/21  Yes Drenda Freeze, MD  DULoxetine (CYMBALTA) 30 MG capsule Take 30 mg by mouth daily.   Yes [provider]  gabapentin (NEURONTIN) 300 MG capsule Take 1 capsule (300 mg total) by mouth 2 (two) times daily. 05/10/21 10/03/21 Yes McDonald, Mia A, PA-C  insulin glargine (LANTUS) 100 UNIT/ML injection Inject 0.05 mLs (5 Units total) into the skin 2 (two) times daily. 03/29/21  Yes Sheikh, Omair Latif, DO  losartan (COZAAR) 25 MG tablet Take 1 tablet (  25 mg total) by mouth daily. 03/30/21  Yes Sheikh, [place], DO  metFORMIN (GLUCOPHAGE) 500 MG tablet Take 500 mg by mouth daily with breakfast.   Yes [provider]  NOVOLOG FLEXPEN 100 UNIT/ML FlexPen Inject 5 Units into the skin with breakfast, with lunch, and with evening meal. 02/19/21  Yes [provider]  pantoprazole (PROTONIX) 40 MG tablet Take 1 tablet (40 mg total) by mouth daily. 02/18/21  Yes Antonieta Pert, MD  doxycycline (VIBRAMYCIN) 100 MG capsule Take 1 capsule (100  mg total) by mouth 2 (two) times daily. One po bid x 7 days Patient not taking: Reported on 10/03/2021 06/29/21   Drenda Freeze, MD  methocarbamol (ROBAXIN) 500 MG tablet Take 1 tablet (500 mg total) by mouth every 6 (six) hours as needed for muscle spasms. Patient not taking: Reported on 10/03/2021 02/17/21   Antonieta Pert, MD  oxyCODONE-acetaminophen (PERCOCET/ROXICET) 5-325 MG tablet Take 1 tablet by mouth every 6 (six) hours as needed for severe pain. Patient not taking: Reported on 10/03/2021 06/01/21   Persons, Bevely [place], [place]    Physical Exam: Vitals:   10/03/21 2200 10/03/21 2230 10/03/21 2330 10/04/21 0019  BP: (!) 158/85 (!) 163/83 126/74   Pulse: (!) 113 (!) 123 (!) 116   Resp: 19 17 (!) 21   Temp:  (!) 102.8 F (39.3 C)  (!) 102.1 F (38.9 C)  TempSrc:  Oral  Oral  SpO2: 96% 95% 97%   Weight:      Height:        Constitutional: NAD, calm  Eyes: PERTLA, lids and conjunctivae normal ENMT: Mucous membranes are moist. Posterior pharynx clear of any exudate or lesions.   Neck: supple, no masses  Respiratory: no wheezing, no crackles. No accessory muscle use.  Cardiovascular: S1 & S2 heard, regular rate and rhythm. No extremity edema.  Abdomen: No distension, no tenderness, soft. Bowel sounds active.  Musculoskeletal: no clubbing / cyanosis. S/p left BKA.   Skin: s/p I&D over left scapula with surrounding erythema, heat, and induration. Warm, dry, well-perfused. Neurologic: CN 2-12 grossly intact. Moving all extremities. Alert and oriented.  Psychiatric: Calm. Cooperative.    Labs and Imaging on Admission: I have personally reviewed following labs and imaging studies  CBC: Recent Labs  Lab 10/03/21 1349  WBC 16.6*  NEUTROABS 14.3*  HGB 14.0  HCT 42.0  MCV 83.3  PLT 99991111   Basic Metabolic Panel: Recent Labs  Lab 10/03/21 1349  NA 128*  K 4.1  CL 95*  CO2 22  GLUCOSE 517*  BUN 17  CREATININE 1.39*  CALCIUM 8.8*   GFR: Estimated Creatinine Clearance: 67.4 mL/min  (A) (by C-G formula based on SCr of 1.39 mg/dL (H)). Liver Function Tests: Recent Labs  Lab 10/03/21 1349  AST 14*  ALT 11  ALKPHOS 108  BILITOT 1.1  PROT 7.6  ALBUMIN 3.1*   No results for input(s): LIPASE, AMYLASE in the last 168 hours. No results for input(s): AMMONIA in the last 168 hours. Coagulation Profile: Recent Labs  Lab 10/03/21 1349  INR 1.0   Cardiac Enzymes: No results for input(s): CKTOTAL, CKMB, CKMBINDEX, TROPONINI in the last 168 hours. BNP (last 3 results) No results for input(s): PROBNP in the last 8760 hours. HbA1C: No results for input(s): HGBA1C in the last 72 hours. CBG: Recent Labs  Lab 10/03/21 1821 10/03/21 2013 10/04/21 0015  GLUCAP 449* 316* 307*   Lipid Profile: No results for input(s): CHOL, HDL, LDLCALC, TRIG, CHOLHDL,  LDLDIRECT in the last 72 hours. Thyroid Function Tests: No results for input(s): TSH, T4TOTAL, FREET4, T3FREE, THYROIDAB in the last 72 hours. Anemia Panel: No results for input(s): VITAMINB12, FOLATE, FERRITIN, TIBC, IRON, RETICCTPCT in the last 72 hours. Urine analysis:    Component Value Date/Time   COLORURINE YELLOW 02/02/2021 2222   APPEARANCEUR HAZY (A) 02/02/2021 2222   LABSPEC 1.024 02/02/2021 2222   PHURINE 5.0 02/02/2021 2222   GLUCOSEU >=500 (A) 02/02/2021 2222   HGBUR MODERATE (A) 02/02/2021 2222   BILIRUBINUR NEGATIVE 02/02/2021 [place] 02/02/2021 2222   PROTEINUR 30 (A) 02/02/2021 2222   NITRITE NEGATIVE 02/02/2021 2222   LEUKOCYTESUR NEGATIVE 02/02/2021 2222   Sepsis Labs: @LABRCNTIP (procalcitonin:4,lacticidven:4) )No results found for this or any previous visit (from the past 240 hour(s)).   Radiological Exams on Admission: DG Chest 2 View  Result Date: 10/03/2021 CLINICAL DATA:  Back infection. EXAM: CHEST - 2 VIEW COMPARISON:  June 29, 2021. FINDINGS: The heart size and mediastinal contours are within normal limits. Both lungs are clear. The visualized skeletal  structures are unremarkable. IMPRESSION: No active cardiopulmonary disease. Electronically Signed   By: Marijo Conception M.D.   On: 10/03/2021 14:16    EKG: Independently reviewed. Sinus tachycardia, rate 108, frequent PVCs.   Assessment/Plan   1. Abscess and cellulitis  - Presents with worsening pain and swelling near left scapula and noted to have abscess with surrounding cellulitis  - He has multiple SIRS criteria in ED but qSOFA is only 1, BP has been normal or high, and lactate normal  - I&D was performed, blood culture collected, and antibiotics started in ED  - Treated with dalbavancin, follow closely, and obtain imaging and/or surgical consultation if worsens or fails to improve   2. Uncontrolled IDDM  - A1c was 11.5% in May 2022; serum glucose 517 in ED without DKA  - Continue CBG checks and insulin    3. AKI  - SCr is 1.39 in ED, up from baseline of 1  - Given 2 liters IVF in ED  - Continue IVF, hold losartan, repeat chem panel in am   4. Depression  - Continue Cymbalta   5. Hypertension   - Hold losartan in light of increased creatinine, treat as-needed only for now   6. PAD - No acute ischemia, continue ASA and statin    DVT prophylaxis: SCDs  Code Status: Full  Level of Care: Level of care: Progressive Family Communication: None present  Disposition Plan:  Patient is from: home  Anticipated d/c is to: Home  Anticipated d/c date is: 10/06/21 Patient currently: pending improvement in skin infection, will need imaging and/or surgical consult if fails to improve with antibiotics  Consults called: none  Admission status: Inpatient     Vianne Bulls, MD [facility]  10/04/2021, 1:07 AM

## 2021-10-05 ENCOUNTER — Encounter (HOSPITAL_COMMUNITY): Payer: Medicare HMO

## 2021-10-05 DIAGNOSIS — E871 Hypo-osmolality and hyponatremia: Secondary | ICD-10-CM | POA: Diagnosis not present

## 2021-10-05 DIAGNOSIS — L0390 Cellulitis, unspecified: Secondary | ICD-10-CM | POA: Diagnosis not present

## 2021-10-05 DIAGNOSIS — E119 Type 2 diabetes mellitus without complications: Secondary | ICD-10-CM | POA: Diagnosis not present

## 2021-10-05 DIAGNOSIS — N179 Acute kidney failure, unspecified: Secondary | ICD-10-CM | POA: Diagnosis not present

## 2021-10-05 LAB — CBC
HCT: 38 % — ABNORMAL LOW (ref 39.0–52.0)
Hemoglobin: 12.2 g/dL — ABNORMAL LOW (ref 13.0–17.0)
MCH: 27.2 pg (ref 26.0–34.0)
MCHC: 32.1 g/dL (ref 30.0–36.0)
MCV: 84.8 fL (ref 80.0–100.0)
Platelets: 292 10*3/uL (ref 150–400)
RBC: 4.48 MIL/uL (ref 4.22–5.81)
RDW: 12.7 % (ref 11.5–15.5)
WBC: 16.5 10*3/uL — ABNORMAL HIGH (ref 4.0–10.5)
nRBC: 0 % (ref 0.0–0.2)

## 2021-10-05 LAB — RAPID URINE DRUG SCREEN, HOSP PERFORMED
Amphetamines: NOT DETECTED
Barbiturates: NOT DETECTED
Benzodiazepines: NOT DETECTED
Cocaine: NOT DETECTED
Opiates: POSITIVE — AB
Tetrahydrocannabinol: NOT DETECTED

## 2021-10-05 LAB — GLUCOSE, CAPILLARY
Glucose-Capillary: 142 mg/dL — ABNORMAL HIGH (ref 70–99)
Glucose-Capillary: 150 mg/dL — ABNORMAL HIGH (ref 70–99)
Glucose-Capillary: 227 mg/dL — ABNORMAL HIGH (ref 70–99)
Glucose-Capillary: 246 mg/dL — ABNORMAL HIGH (ref 70–99)
Glucose-Capillary: 312 mg/dL — ABNORMAL HIGH (ref 70–99)
Glucose-Capillary: 350 mg/dL — ABNORMAL HIGH (ref 70–99)

## 2021-10-05 LAB — URINALYSIS, ROUTINE W REFLEX MICROSCOPIC
Bilirubin Urine: NEGATIVE
Glucose, UA: 150 mg/dL — AB
Hgb urine dipstick: NEGATIVE
Ketones, ur: 5 mg/dL — AB
Leukocytes,Ua: NEGATIVE
Nitrite: NEGATIVE
Protein, ur: 100 mg/dL — AB
Specific Gravity, Urine: 1.023 (ref 1.005–1.030)
pH: 5 (ref 5.0–8.0)

## 2021-10-05 LAB — C-REACTIVE PROTEIN: CRP: 25.7 mg/dL — ABNORMAL HIGH (ref <1.0)

## 2021-10-05 LAB — BASIC METABOLIC PANEL
Anion gap: 11 (ref 5–15)
BUN: 34 mg/dL — ABNORMAL HIGH (ref 6–20)
CO2: 22 mmol/L (ref 22–32)
Calcium: 8.5 mg/dL — ABNORMAL LOW (ref 8.9–10.3)
Chloride: 97 mmol/L — ABNORMAL LOW (ref 98–111)
Creatinine, Ser: 1.65 mg/dL — ABNORMAL HIGH (ref 0.61–1.24)
GFR, Estimated: 48 mL/min — ABNORMAL LOW (ref >60)
Glucose, Bld: 212 mg/dL — ABNORMAL HIGH (ref 70–99)
Potassium: 3.6 mmol/L (ref 3.5–5.1)
Sodium: 130 mmol/L — ABNORMAL LOW (ref 135–145)

## 2021-10-05 LAB — MRSA NEXT GEN BY PCR, NASAL: MRSA by PCR Next Gen: NOT DETECTED

## 2021-10-05 LAB — MAGNESIUM: Magnesium: 2 mg/dL (ref 1.7–2.4)

## 2021-10-05 LAB — SEDIMENTATION RATE: Sed Rate: 66 mm/hr — ABNORMAL HIGH (ref 0–16)

## 2021-10-05 NOTE — Evaluation (Signed)
Physical Therapy Evaluation Patient Details Name: Brett White MRN: [id number] DOB: 03-22-1961 Today's Date: 10/05/2021  History of Present Illness  61 y.o. male presents to [facility] hospital on 10/03/2021 with progressive pain and swelling near left scapula, admitted for management of cellulitis. Pt underwent L shoulder I&D abscess. Pt also with hyponatremia. PMH includes DMII, HTN, L BKA.  Clinical Impression  Pt presents to PT with significant pain in L posterior shoulder, radiating down LUE. Pt's LUE shoulder and elbow ROM are limited due to pain, and pt has increased difficulty utilizing LUE to push up into standing. Pt currently requires physical assistance to transfer, and reports no caregiver support available at the time of discharge. PT recommends SNF placement currently as the pt has no assistance available, and demonstrates the potential to return to a modI level of mobility with continued inpatient PT services.       Recommendations for follow up therapy are one component of a multi-disciplinary discharge planning process, led by the attending physician.  Recommendations may be updated based on patient status, additional functional criteria and insurance authorization.  Follow Up Recommendations Skilled nursing-short term rehab (<3 hours/day)    Assistance Recommended at Discharge Intermittent Supervision/Assistance  Patient can return home with the following  A little help with walking and/or transfers;A little help with bathing/dressing/bathroom;Assistance with cooking/housework;Assist for transportation;Help with stairs or ramp for entrance    Equipment Recommendations None recommended by PT  Recommendations for Other Services       Functional Status Assessment Patient has had a recent decline in their functional status and demonstrates the ability to make significant improvements in function in a reasonable and predictable amount of time.     Precautions / Restrictions  Precautions Precautions: Fall Precaution Comments: old L BKA Restrictions Weight Bearing Restrictions: No      Mobility  Bed Mobility Overal bed mobility: Needs Assistance Bed Mobility: Supine to Sit     Supine to sit: Min assist;HOB elevated          Transfers Overall transfer level: Needs assistance Equipment used: Rolling walker (2 wheels) Transfers: Sit to/from Stand;Bed to chair/wheelchair/BSC Sit to Stand: Min assist   Step pivot transfers: Min assist       General transfer comment: pt performs a hop-pivot transfer, assist provided for balance and descent into chair    Ambulation/Gait Ambulation/Gait assistance:  (pt declines due to discomfort)                Stairs            Wheelchair Mobility    Modified Rankin (Stroke Patients Only)       Balance Overall balance assessment: Needs assistance Sitting-balance support: No upper extremity supported;Feet supported Sitting balance-Leahy Scale: Good     Standing balance support: Bilateral upper extremity supported;Reliant on assistive device for balance Standing balance-Leahy Scale: Poor                               Pertinent Vitals/Pain Pain Assessment: Faces Faces Pain Scale: Hurts whole lot Pain Location: L scapula Pain Descriptors / Indicators: Pounding Pain Intervention(s): RN gave pain meds during session    Home Living Family/patient expects to be discharged to:: Private residence Living Arrangements:  (has 2 roommates but reports they are unwilling to provide any assistance. No other caregiver support identified.) Available Help at Discharge:  (none identified) Type of Home: Apartment Home Access: Level entry  Home Layout: One level Home Equipment: Agricultural consultant (2 wheels);Wheelchair - manual      Prior Function Prior Level of Function : Independent/Modified Independent             Mobility Comments: pt reports ambulating very short distances  from bed to bathroom, otherwise utilizing a manual wheelchair for all out of bed mobility       Hand Dominance   Dominant Hand: Right    Extremity/Trunk Assessment   Upper Extremity Assessment Upper Extremity Assessment: LUE deficits/detail LUE Deficits / Details: pt with L shoulder flexion AROM limited to ~30 degrees, elbow flexion limited to ~90 degrees by reports of pain    Lower Extremity Assessment Lower Extremity Assessment: Generalized weakness    Cervical / Trunk Assessment Cervical / Trunk Assessment: Other exceptions (swelling and erythema noted around dressing site on L posterior shoulder)  Communication   Communication: No difficulties  Cognition Arousal/Alertness: Awake/alert Behavior During Therapy: WFL for tasks assessed/performed Overall Cognitive Status: Within Functional Limits for tasks assessed                                          General Comments General comments (skin integrity, edema, etc.): VSS, pt on 2L [place] upon PT arrival, sats ranging from 88-92% on RA during session. PT returns pt to 2L [place] prior to departure    Exercises     Assessment/Plan    PT Assessment Patient needs continued PT services  PT Problem List Decreased strength;Decreased range of motion;Decreased activity tolerance;Decreased balance;Decreased mobility;Decreased knowledge of precautions;Pain       PT Treatment Interventions DME instruction;Gait training;Functional mobility training;Therapeutic activities;Therapeutic exercise;Balance training;Patient/family education;Wheelchair mobility training    PT Goals (Current goals can be found in the Care Plan section)  Acute Rehab PT Goals Patient Stated Goal: to reduced L shoulder discomfort and return to independence PT Goal Formulation: With patient Time For Goal Achievement: 10/19/21 Potential to Achieve Goals: Good Additional Goals Additional Goal #1: Pt will mobilize in a manual wheelchair for 100+ feet to  demonstrate the ability to mobilize for limited community distances    Frequency Min 3X/week     Co-evaluation               AM-PAC PT "6 Clicks" Mobility  Outcome Measure Help needed turning from your back to your side while in a flat bed without using bedrails?: A Little Help needed moving from lying on your back to sitting on the side of a flat bed without using bedrails?: A Little Help needed moving to and from a bed to a chair (including a wheelchair)?: A Little Help needed standing up from a chair using your arms (e.g., wheelchair or bedside chair)?: A Little Help needed to walk in hospital room?: Total Help needed climbing 3-5 steps with a railing? : Total 6 Click Score: 14    End of Session   Activity Tolerance: Patient limited by pain Patient left: in chair;with call bell/phone within reach;with chair alarm set Nurse Communication: Mobility status PT Visit Diagnosis: Other abnormalities of gait and mobility (R26.89);Muscle weakness (generalized) (M62.81);Pain Pain - Right/Left: Left Pain - part of body: Shoulder    Time: 3790-2409 PT Time Calculation (min) (ACUTE ONLY): 24 min   Charges:   PT Evaluation $PT Eval Low Complexity: 1 Low          Arlyss Gandy, PT, DPT Acute  Rehabilitation Pager: [phone] Office [phone]   Arlyss GandyRyan J Zackory Pudlo 10/05/2021, 10:59 AM

## 2021-10-05 NOTE — NC FL2 (Signed)
[place] MEDICAID FL2 LEVEL OF CARE SCREENING TOOL     IDENTIFICATION  Patient Name: Brett White Birthdate: October 11, 1960 Sex: male Admission Date (Current Location): 10/03/2021  [facility] and [place] Number:  Producer, television/film/video and Address:  [facility], [address], [place], [place] [address]      Provider Number: [id number]  Attending Physician Name and Address:  Albertine Grates, MD  Relative Name and Phone Number:       Current Level of Care: Hospital Recommended Level of Care: Skilled Nursing Facility Prior Approval Number:    Date Approved/Denied:   PASRR Number: [id number]  Discharge Plan: SNF    Current Diagnoses: Patient Active Problem List   Diagnosis Date Noted   AKI (acute kidney injury) (HCC) 10/04/2021   Cellulitis 10/03/2021   Acquired absence of left leg below knee (HCC) 04/05/2021   Left foot infection 03/17/2021   Pain in left ankle and joints of left foot    Back abscess 03/03/2021   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   Uncontrolled type 2 diabetes mellitus with hyperglycemia (HCC) 02/03/2021   Essential hypertension 02/03/2021   Lactic acidosis 02/03/2021   Diabetic peripheral neuropathy associated with type 2 diabetes mellitus (HCC) 02/03/2021   Osteomyelitis (HCC) 02/03/2021    Orientation RESPIRATION BLADDER Height & Weight     Self, Time, Situation, Place  O2 (Nasal cannula 2L) Continent Weight: 210 lb (95.3 kg) Height:  6\' 2"  (188 cm)  BEHAVIORAL SYMPTOMS/MOOD NEUROLOGICAL BOWEL NUTRITION STATUS      Continent Diet (see dc summary)  AMBULATORY STATUS COMMUNICATION OF NEEDS Skin   Limited Assist Verbally Normal                       Personal Care Assistance Level of Assistance  Bathing, Feeding, Dressing Bathing Assistance: Limited assistance Feeding assistance: Independent Dressing Assistance: Limited assistance     Functional Limitations Info             SPECIAL CARE  FACTORS FREQUENCY  PT (By licensed PT), OT (By licensed OT)     PT Frequency: 5x/week OT Frequency: 5x/week            Contractures Contractures Info: Not present    Additional Factors Info  Code Status, Allergies, Psychotropic, Insulin Sliding Scale Code Status Info: Full Allergies Info: NKA Psychotropic Info: Cymbalta Insulin Sliding Scale Info: See DC Summary       Current Medications (10/05/2021):  This is the current hospital active medication list Current Facility-Administered Medications  Medication Dose Route Frequency Provider Last Rate Last Admin   acetaminophen (TYLENOL) tablet 650 mg  650 mg Oral Q4H PRN Opyd, 12/03/2021, MD   650 mg at 10/04/21 1618   Or   acetaminophen (TYLENOL) suppository 650 mg  650 mg Rectal Q6H PRN Opyd, 1619, MD       acetaminophen (TYLENOL) tablet 325 mg  325 mg Oral Once Opyd, Lavone Neri, MD       aspirin chewable tablet 81 mg  81 mg Oral Daily Opyd, Lavone Neri, MD   81 mg at 10/05/21 12/03/21   atorvastatin (LIPITOR) tablet 10 mg  10 mg Oral Daily Opyd, 3790, MD   10 mg at 10/05/21 12/03/21   bisacodyl (DULCOLAX) EC tablet 5 mg  5 mg Oral Daily PRN Opyd, 2409, MD       DULoxetine (CYMBALTA) DR capsule 30 mg  30 mg Oral Daily  Briscoe Deutscher, MD   30 mg at 10/05/21 2841   gabapentin (NEURONTIN) capsule 300 mg  300 mg Oral BID Briscoe Deutscher, MD   300 mg at 10/05/21 0906   HYDROmorphone (DILAUDID) injection 0.5 mg  0.5 mg Intravenous Q4H PRN Albertine Grates, MD   0.5 mg at 10/05/21 0902   insulin aspart (novoLOG) injection 0-15 Units  0-15 Units Subcutaneous Q4H Briscoe Deutscher, MD   2 Units at 10/05/21 3244   insulin glargine-yfgn (SEMGLEE) injection 20 Units  20 Units Subcutaneous QHS Albertine Grates, MD   20 Units at 10/04/21 2219   ondansetron [facility]) injection 4 mg  4 mg Intravenous Q6H PRN Albertine Grates, MD       oxyCODONE (Oxy IR/ROXICODONE) immediate release tablet 5 mg  5 mg Oral Q4H PRN Opyd, Lavone Neri, MD   5 mg at 10/04/21 2206   pantoprazole  (PROTONIX) EC tablet 40 mg  40 mg Oral Daily Opyd, Lavone Neri, MD   40 mg at 10/05/21 0905   pneumococcal 23 valent vaccine (PNEUMOVAX-23) injection 0.5 mL  0.5 mL Intramuscular Tomorrow-1000 Opyd, Lavone Neri, MD       senna-docusate (Senokot-S) tablet 1 tablet  1 tablet Oral QHS PRN Opyd, Lavone Neri, MD       senna-docusate (Senokot-S) tablet 1 tablet  1 tablet Oral BID Albertine Grates, MD   1 tablet at 10/05/21 0102   sodium chloride 0.9 % bolus 1,000 mL  1,000 mL Intravenous Once Briscoe Deutscher, MD   Stopped at 10/03/21 2150   sodium chloride flush (NS) 0.9 % injection 10-40 mL  10-40 mL Intracatheter Q12H Opyd, Lavone Neri, MD   10 mL at 10/04/21 2220   sodium chloride flush (NS) 0.9 % injection 10-40 mL  10-40 mL Intracatheter PRN Opyd, Lavone Neri, MD       sodium chloride flush (NS) 0.9 % injection 3 mL  3 mL Intravenous Q12H Opyd, Lavone Neri, MD   3 mL at 10/04/21 0137     Discharge Medications: Please see discharge summary for a list of discharge medications.  Relevant Imaging Results:  Relevant Lab Results:   Additional Information SS# [id number]. Moderna COVID-19 Vaccine 07/25/2021  Mearl Latin, LCSW

## 2021-10-05 NOTE — Progress Notes (Addendum)
Inpatient Diabetes Program Recommendations  AACE/ADA: New Consensus Statement on Inpatient Glycemic Control   Target Ranges:  Prepandial:   less than 140 mg/dL      Peak postprandial:   less than 180 mg/dL (1-2 hours)      Critically ill patients:  140 - 180 mg/dL    Latest Reference Range & Units 10/04/21 20:15 10/05/21 00:34 10/05/21 03:57 10/05/21 09:02  Glucose-Capillary 70 - 99 mg/dL 224 (H) 227 (H) 142 (H) 150 (H)    Latest Reference Range & Units 02/03/21 02:17 10/04/21 02:18  Hemoglobin A1C 4.8 - 5.6 % 11.5 (H) 12.2 (H)   Review of Glycemic Control  Diabetes history: DM2 Outpatient Diabetes medications: Lantus 5 units BID, Novolog 5 units TID, Metformin 500 mg daily Current orders for Inpatient glycemic control: Semglee 20 units QHS, Novolog 0-15 units Q4H  Inpatient Diabetes Program Recommendations:    HbgA1C:  A1C 12.2% on 10/04/21 indicating an average glucose of 303 mg/dl over the past 2-3 months.  Outpatient DM needs: Since patient is not able to afford Lantus and Novolog insulin, would recommend to change to Novolin 70/30 insulin (pens) since it is much more affordable ($43 per box of 5 insulin pens at [facility]).  At time of discharge, please provide Rx for Novolin 70/30 [id number]) and insulin pen needles [phone]).  NOTE: Noted consult for diabetes coordinator; patient not able to afford insulin. Will plan to speak with patient today.  Addendum 10/05/21@13 :25-Spoke with patient about diabetes and home regimen for diabetes control. Patient reports being followed by PCP for diabetes management and currently taking only Metformin 500 mg daily as an outpatient for diabetes control. Patient reports he was taking Lantus 5 units BID and Novolog 5 units TID with meals until he ran out about 1 month ago.  Patient states he can not afford to use Lantus and Novolog insulin.  Inquired about any prior use of Novolin NPH, Regular, or 70/30 insulin and patient states he has never used those  insulins.  Patient reports that his glucose has been in the 300's recently since he has not had any insulin.  Discussed A1C results (12.2% on 10/04/21) and explained that current A1C indicates an average glucose of 303 mg/dl over the past 2-3 months. Discussed glucose and A1C goals. Discussed importance of checking CBGs and maintaining good CBG control to prevent long-term and short-term complications. Explained how hyperglycemia leads to damage within blood vessels which lead to the common complications seen with uncontrolled diabetes. Stressed to the patient the importance of improving glycemic control to prevent further complications from uncontrolled diabetes. Discussed impact of nutrition, exercise, stress, sickness, and medications on diabetes control. Discussed Novolin 70/30 insulin and informed patient that it was $43 per box of 5 insulin pens at [employer]). Patient states that would be much more affordable for him and prefers to use insulin pens.  Patient verbalized understanding of information discussed and reports no further questions at this time related to diabetes.  Thanks, Barnie Alderman, RN, MSN, CDE Diabetes Coordinator Inpatient Diabetes Program [phone] (Team Pager from 8am to 5pm)

## 2021-10-05 NOTE — Progress Notes (Signed)
PROGRESS NOTE    Brett White  [id number] DOB: 10/09/1960 DOA: 10/03/2021 PCP: System, Provider Not In    Chief Complaint  Patient presents with   Wound Infection   Abscess    Brief Narrative:  Brett White is a pleasant 61 y.o. 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  The patient reports noticing some pain and swelling near the left scapula approximately 2 days ago thought he may have been bit by a spider though he did not see any insect.  He has had some subjective fevers and chills associated with this.   Subjective:  left upper back pain, pain feels like going to the shoulder , dilaudid help No tenderness on palpation of shoulder joint but limited range of motion due to pain Feel nauseou when getting up,  dizziness resolved  Fever resolved  Reports memory issues , he appears to have memory issues not oriented to year/month, not able to name the hospital   Assessment & Plan:   Principal Problem:   Cellulitis Active Problems:   Uncontrolled type 2 diabetes mellitus with hyperglycemia (HCC)   Back abscess   AKI (acute kidney injury) ([place])  Abscess/cellulitis/sepsis, present on admission With fever, tachycardia, leukocytosis -Status post I&D left shoulder abscess, I do not see wound culture sent, blood culture no growth, negative MRSA screening -He received dalbavancin in the ED -If clinically does not improve will consider imaging,  talk to general surgery?    Insulin-dependent type 2 diabetes, uncontrolled, with hyperglycemia A1c 12.2 Reports can not afford insulin, only takes metformin at home Start insulin here, continue to adjust Diabetes RN consulted  Peripheral neuropathy Significantly decreased sensation  right lower leg/foot  N/V Improved Denies ab pain, kub + constipation, start stool softener   AKI From dehydration? 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 He is able to give history but not able tell me the right month or year  Depression continue Cymbalta  FTT, appear to have poor social supports, reports pcp is in the process getting him to a SNF, will get PT eval and social worker consult     Body mass index is 26.96 kg/m.Mar[place] Kitchen      Unresulted Labs (From admission, onward)     Start     Ordered   10/04/21 1846  Urinalysis, Routine w reflex microscopic  Once,   R        10/04/21 1845   10/04/21 1444  Urine rapid drug screen (hosp performed)  ONCE - STAT,   STAT        10/04/21 1443   10/04/21 [id number]  Basic metabolic panel  Daily,   R      10/04/21 0107   10/04/21 0500  CBC  Daily,   R      10/04/21 0107              DVT prophylaxis: SCDs Start: 10/04/21 0105   Code Status: full Family Communication: patient Disposition:   Status is: Inpatient  Dispo: The  patient is from: home, reports Relocated from [place] to [place] [employer], does not have good social support here              Anticipated d/c is to: SNF              Anticipated d/c date is: TBD                Consultants:  none  Procedures:  I/D abscess in the ED  Antimicrobials:    Anti-infectives (From admission, onward)    Start     Dose/Rate Route Frequency Ordered Stop   10/03/21 1745  dalbavancin (DALVANCE) 1,500 mg in dextrose 5 % 500 mL IVPB        1,500 mg 967.7 mL/hr over 31 Minutes Intravenous Once 10/03/21 1736 10/03/21 2234           Objective: Vitals:   10/05/21 0205 10/05/21 0208 10/05/21 0400 10/05/21 0858  BP:   122/80 (!) 145/86  Pulse: 89  91 89  Resp:   19 20  Temp:   99.5 F (37.5 C) 99 F (37.2 C)  TempSrc:   Oral Oral  SpO2: (!) 89% 94% 96% 95%  Weight:      Height:        Intake/Output Summary (Last 24 hours) at 10/05/2021 1241 Last data filed at 10/05/2021 0343 Gross per 24 hour  Intake 557.84 ml  Output --  Net 557.84 ml   Filed Weights   10/03/21 1317  Weight: 95.3 kg    Examination:  General exam: does not look comfortable, chronically ill appearing ,alert, awake, communicative Respiratory system: Clear to auscultation. Respiratory effort normal. Cardiovascular system:  RRR.  Gastrointestinal system: Abdomen is nondistended, soft and nontender.  Normal bowel sounds heard. Central nervous system: Alert and orientedx2, not able to tell me the right year /month.  Extremities:  s/p left BKA,  Skin: No rashes, lesions or ulcers Psychiatry: Judgement and insight appear normal. Mood & affect appropriate.     Data Reviewed: I have personally reviewed following labs and imaging studies  CBC: Recent Labs  Lab 10/03/21 1349 10/04/21 0218 10/05/21 0135  WBC 16.6* 21.9* 16.5*  NEUTROABS 14.3*  --   --   HGB 14.0 12.6* 12.2*  HCT 42.0 38.0* 38.0*  MCV 83.3 84.3 84.8  PLT 320 290 [id number]    Basic Metabolic Panel: Recent Labs  Lab 10/03/21 1349 10/04/21 0218 10/05/21 0135  NA 128* 129* 130*  K 4.1 3.7 3.6  CL 95* 94* 97*  CO2 22 25 22   GLUCOSE 517* 321* 212*  BUN 17 21* 34*  CREATININE 1.39* 1.77* 1.65*  CALCIUM 8.8* 8.5* 8.5*  MG  --   --  2.0    GFR: Estimated Creatinine Clearance: 56.7 mL/min (A) (by C-G formula based on SCr of 1.65 mg/dL (H)).  Liver Function Tests: Recent Labs  Lab 10/03/21 1349  AST 14*  ALT 11  ALKPHOS 108  BILITOT 1.1  PROT 7.6  ALBUMIN 3.1*    CBG: Recent Labs  Lab 10/04/21 1615 10/04/21 2015 10/05/21 0034 10/05/21 0357  10/05/21 0902  GLUCAP 316* 224* 227* 142* 150*     Recent Results (from the past 240 hour(s))  Blood culture (routine single)     Status: None (Preliminary result)   Collection Time: 10/03/21  1:49 PM   Specimen: Site Not Specified; Blood  Result Value Ref Range Status   Specimen Description SITE NOT SPECIFIED  Final   Special Requests  Final    BOTTLES DRAWN AEROBIC AND ANAEROBIC Blood Culture adequate volume   Culture   Final    NO GROWTH 2 DAYS Performed at [facility], [place] [address] [place], [place] [address]    Report Status PENDING  Incomplete  Resp Panel by RT-PCR (Flu A&B, Covid) Nasopharyngeal Swab     Status: None   Collection Time: 10/04/21 12:41 AM   Specimen: Nasopharyngeal Swab; Nasopharyngeal(NP) swabs in vial transport medium  Result Value Ref Range Status   SARS Coronavirus 2 by RT PCR NEGATIVE NEGATIVE Final    Comment: (NOTE) SARS-CoV-2 target nucleic acids are NOT DETECTED.  The SARS-CoV-2 RNA is generally detectable in upper respiratory specimens during the acute phase of infection. The lowest concentration of SARS-CoV-2 viral copies this assay can detect is 138 copies/mL. A negative result does not preclude SARS-Cov-2 infection and should not be used as the sole basis for treatment or other patient management decisions. A negative result may occur with  improper specimen collection/handling, submission of specimen other than nasopharyngeal swab, presence of viral mutation(s) within the areas targeted by this assay, and inadequate number of viral copies(<138 copies/mL). A negative result must be combined with clinical observations, patient history, and epidemiological information. The expected result is Negative.  Fact Sheet for Patients:  EntrepreneurPulse.com.au  Fact Sheet for Healthcare Providers:  IncredibleEmployment.be  This test is no t yet approved or cleared by the [place] FDA and  has  been authorized for detection and/or diagnosis of SARS-CoV-2 by FDA under an Emergency Use Authorization (EUA). This EUA will remain  in effect (meaning this test can be used) for the duration of the COVID-19 declaration under Section 564(b)(1) of the Act, 21 U.S.C.section 360bbb-3(b)(1), unless the authorization is terminated  or revoked sooner.       Influenza A by PCR NEGATIVE NEGATIVE Final   Influenza B by PCR NEGATIVE NEGATIVE Final    Comment: (NOTE) The Xpert Xpress SARS-CoV-2/FLU/RSV plus assay is intended as an aid in the diagnosis of influenza from Nasopharyngeal swab specimens and should not be used as a sole basis for treatment. Nasal washings and aspirates are unacceptable for Xpert Xpress SARS-CoV-2/FLU/RSV testing.  Fact Sheet for Patients: EntrepreneurPulse.com.au  Fact Sheet for Healthcare Providers: IncredibleEmployment.be  This test is not yet approved or cleared by the [place] FDA and has been authorized for detection and/or diagnosis of SARS-CoV-2 by FDA under an Emergency Use Authorization (EUA). This EUA will remain in effect (meaning this test can be used) for the duration of the COVID-19 declaration under Section 564(b)(1) of the Act, 21 U.S.C. section 360bbb-3(b)(1), unless the authorization is terminated or revoked.  Performed at [facility], [place] [address], [place], [place] [address]   MRSA Next Gen by PCR, Nasal     Status: None   Collection Time: 10/04/21 10:41 PM   Specimen: Nasal Mucosa; Nasal Swab  Result Value Ref Range Status   MRSA by PCR Next Gen NOT DETECTED NOT DETECTED Final    Comment: (NOTE) The GeneXpert MRSA Assay (FDA approved for NASAL specimens only), is one component of a comprehensive MRSA colonization surveillance program. It is not intended to diagnose MRSA infection nor to guide or monitor treatment for MRSA infections. Test performance is not FDA approved in patients less  than 86 years old. Performed at [facility], [place] [address] [place], [place] [address]          Radiology Studies: DG Chest 2  View  Result Date: 10/03/2021 CLINICAL DATA:  Back infection. EXAM: CHEST - 2 VIEW COMPARISON:  June 29, 2021. FINDINGS: The heart size and mediastinal contours are within normal limits. Both lungs are clear. The visualized skeletal structures are unremarkable. IMPRESSION: No active cardiopulmonary disease. Electronically Signed   By: Marijo Conception M.D.   On: 10/03/2021 14:16   DG Abd 1 View  Result Date: 10/04/2021 CLINICAL DATA:  Nausea, vomiting, constipation. EXAM: ABDOMEN - 1 VIEW COMPARISON:  None. FINDINGS: The bowel gas pattern is normal. Mild amount of stool seen in the right colon. No radio-opaque calculi or other significant radiographic abnormality are seen. IMPRESSION: Mild stool burden.  No abnormal bowel dilatation. Electronically Signed   By: Marijo Conception M.D.   On: 10/04/2021 15:12        Scheduled Meds:  acetaminophen  325 mg Oral Once   aspirin  81 mg Oral Daily   atorvastatin  10 mg Oral Daily   DULoxetine  30 mg Oral Daily   gabapentin  300 mg Oral BID   insulin aspart  0-15 Units Subcutaneous Q4H   insulin glargine-yfgn  20 Units Subcutaneous QHS   pantoprazole  40 mg Oral Daily   pneumococcal 23 valent vaccine  0.5 mL Intramuscular Tomorrow-1000   senna-docusate  1 tablet Oral BID   sodium chloride flush  10-40 mL Intracatheter Q12H   sodium chloride flush  3 mL Intravenous Q12H   Continuous Infusions:  sodium chloride Stopped (10/03/21 2150)     LOS: 2 days   Time spent: 30mins Greater than 50% of this time was spent in counseling, explanation of diagnosis, planning of further management, and coordination of care.   Voice Recognition Viviann Spare dictation system was used to create this note, attempts have been made to correct errors. Please contact the author with questions and/or clarifications.   [name] Reasons,  MD PhD FACP [facility]  Available via Epic secure chat 7am-7pm for nonurgent issues Please page for urgent issues To page the attending provider between 7A-7P or the covering provider during after hours 7P-7A, please log into the web site [email or web address] and access using universal [place] password for that web site. If you do not have the password, please call the hospital operator.    10/05/2021, 12:41 PM

## 2021-10-06 ENCOUNTER — Inpatient Hospital Stay (HOSPITAL_COMMUNITY): Payer: Medicare HMO

## 2021-10-06 ENCOUNTER — Inpatient Hospital Stay (HOSPITAL_COMMUNITY): Payer: Medicare HMO | Admitting: Certified Registered Nurse Anesthetist

## 2021-10-06 ENCOUNTER — Encounter (HOSPITAL_COMMUNITY): Payer: Self-pay | Admitting: Family Medicine

## 2021-10-06 ENCOUNTER — Encounter (HOSPITAL_COMMUNITY)
Admission: EM | Disposition: A | Discharge status: Home Health | Payer: Self-pay | Source: Ambulatory Visit | Attending: Internal Medicine

## 2021-10-06 DIAGNOSIS — I739 Peripheral vascular disease, unspecified: Secondary | ICD-10-CM

## 2021-10-06 DIAGNOSIS — L0390 Cellulitis, unspecified: Secondary | ICD-10-CM | POA: Diagnosis not present

## 2021-10-06 DIAGNOSIS — N179 Acute kidney failure, unspecified: Secondary | ICD-10-CM | POA: Diagnosis not present

## 2021-10-06 DIAGNOSIS — E119 Type 2 diabetes mellitus without complications: Secondary | ICD-10-CM | POA: Diagnosis not present

## 2021-10-06 DIAGNOSIS — E871 Hypo-osmolality and hyponatremia: Secondary | ICD-10-CM | POA: Diagnosis not present

## 2021-10-06 HISTORY — PX: INCISION AND DRAINAGE ABSCESS: SHX5864

## 2021-10-06 LAB — POCT I-STAT, CHEM 8
BUN: 22 mg/dL — ABNORMAL HIGH (ref 6–20)
Calcium, Ion: 1.07 mmol/L — ABNORMAL LOW (ref 1.15–1.40)
Chloride: 98 mmol/L (ref 98–111)
Creatinine, Ser: 0.8 mg/dL (ref 0.61–1.24)
Glucose, Bld: 117 mg/dL — ABNORMAL HIGH (ref 70–99)
HCT: 37 % — ABNORMAL LOW (ref 39.0–52.0)
Hemoglobin: 12.6 g/dL — ABNORMAL LOW (ref 13.0–17.0)
Potassium: 4.1 mmol/L (ref 3.5–5.1)
Sodium: 134 mmol/L — ABNORMAL LOW (ref 135–145)
TCO2: 27 mmol/L (ref 22–32)

## 2021-10-06 LAB — GLUCOSE, CAPILLARY
Glucose-Capillary: 125 mg/dL — ABNORMAL HIGH (ref 70–99)
Glucose-Capillary: 125 mg/dL — ABNORMAL HIGH (ref 70–99)
Glucose-Capillary: 135 mg/dL — ABNORMAL HIGH (ref 70–99)
Glucose-Capillary: 141 mg/dL — ABNORMAL HIGH (ref 70–99)
Glucose-Capillary: 174 mg/dL — ABNORMAL HIGH (ref 70–99)
Glucose-Capillary: 184 mg/dL — ABNORMAL HIGH (ref 70–99)
Glucose-Capillary: 265 mg/dL — ABNORMAL HIGH (ref 70–99)

## 2021-10-06 LAB — CBC
HCT: 32.9 % — ABNORMAL LOW (ref 39.0–52.0)
Hemoglobin: 10.6 g/dL — ABNORMAL LOW (ref 13.0–17.0)
MCH: 27.2 pg (ref 26.0–34.0)
MCHC: 32.2 g/dL (ref 30.0–36.0)
MCV: 84.6 fL (ref 80.0–100.0)
Platelets: 312 10*3/uL (ref 150–400)
RBC: 3.89 MIL/uL — ABNORMAL LOW (ref 4.22–5.81)
RDW: 12.8 % (ref 11.5–15.5)
WBC: 12.8 10*3/uL — ABNORMAL HIGH (ref 4.0–10.5)
nRBC: 0 % (ref 0.0–0.2)

## 2021-10-06 LAB — BASIC METABOLIC PANEL
Anion gap: 9 (ref 5–15)
BUN: 28 mg/dL — ABNORMAL HIGH (ref 6–20)
CO2: 24 mmol/L (ref 22–32)
Calcium: 8.3 mg/dL — ABNORMAL LOW (ref 8.9–10.3)
Chloride: 96 mmol/L — ABNORMAL LOW (ref 98–111)
Creatinine, Ser: 1.04 mg/dL (ref 0.61–1.24)
GFR, Estimated: >60 mL/min (ref >60)
Glucose, Bld: 168 mg/dL — ABNORMAL HIGH (ref 70–99)
Potassium: 3.8 mmol/L (ref 3.5–5.1)
Sodium: 129 mmol/L — ABNORMAL LOW (ref 135–145)

## 2021-10-06 LAB — TROPONIN I (HIGH SENSITIVITY): Troponin I (High Sensitivity): 17 ng/L (ref <18)

## 2021-10-06 SURGERY — INCISION AND DRAINAGE, ABSCESS
Anesthesia: General

## 2021-10-06 MED ORDER — PROPOFOL 10 MG/ML IV BOLUS
INTRAVENOUS | Status: AC
  Filled 2021-10-06: qty 20

## 2021-10-06 MED ORDER — PHENYLEPHRINE 40 MCG/ML (10ML) SYRINGE FOR IV PUSH (FOR BLOOD PRESSURE SUPPORT)
PREFILLED_SYRINGE | INTRAVENOUS | Status: DC | PRN
Start: 2021-10-06 — End: 2021-10-06
  Administered 2021-10-06: 40 ug via INTRAVENOUS

## 2021-10-06 MED ORDER — KETAMINE HCL 50 MG/5ML IJ SOSY
PREFILLED_SYRINGE | INTRAMUSCULAR | Status: AC
  Filled 2021-10-06: qty 5

## 2021-10-06 MED ORDER — GABAPENTIN 300 MG PO CAPS
300.0000 mg | ORAL_CAPSULE | Freq: Three times a day (TID) | ORAL | Status: DC
  Administered 2021-10-06 – 2021-10-09 (×10): 300 mg via ORAL
  Filled 2021-10-06 (×11): qty 1

## 2021-10-06 MED ORDER — LACTATED RINGERS IV SOLN: INTRAVENOUS | Status: DC

## 2021-10-06 MED ORDER — KETOROLAC TROMETHAMINE 30 MG/ML IJ SOLN
INTRAMUSCULAR | Status: AC
  Filled 2021-10-06: qty 1

## 2021-10-06 MED ORDER — FENTANYL CITRATE (PF) 250 MCG/5ML IJ SOLN
INTRAMUSCULAR | Status: AC
  Filled 2021-10-06: qty 5

## 2021-10-06 MED ORDER — KETOROLAC TROMETHAMINE 30 MG/ML IJ SOLN
30.0000 mg | Freq: Once | INTRAMUSCULAR | Status: AC | PRN
  Administered 2021-10-06: 30 mg via INTRAVENOUS

## 2021-10-06 MED ORDER — OXYCODONE HCL 5 MG/5ML PO SOLN: 5.0000 mg | Freq: Once | ORAL | Status: DC | PRN

## 2021-10-06 MED ORDER — SUGAMMADEX SODIUM 500 MG/5ML IV SOLN
INTRAVENOUS | Status: DC | PRN
  Administered 2021-10-06: 300 mg via INTRAVENOUS

## 2021-10-06 MED ORDER — MIDAZOLAM HCL 5 MG/5ML IJ SOLN
INTRAMUSCULAR | Status: DC | PRN
  Administered 2021-10-06: 2 mg via INTRAVENOUS

## 2021-10-06 MED ORDER — KETAMINE HCL 10 MG/ML IJ SOLN
INTRAMUSCULAR | Status: DC | PRN
  Administered 2021-10-06: 20 mg via INTRAVENOUS

## 2021-10-06 MED ORDER — 0.9 % SODIUM CHLORIDE (POUR BTL) OPTIME
TOPICAL | Status: DC | PRN
  Administered 2021-10-06: 1000 mL

## 2021-10-06 MED ORDER — OXYCODONE HCL 5 MG PO TABS
5.0000 mg | ORAL_TABLET | ORAL | Status: DC | PRN
  Administered 2021-10-06 – 2021-10-18 (×33): 10 mg via ORAL
  Filled 2021-10-06 (×34): qty 2

## 2021-10-06 MED ORDER — ORAL CARE MOUTH RINSE: 15.0000 mL | Freq: Once | OROMUCOSAL | Status: AC

## 2021-10-06 MED ORDER — ONDANSETRON HCL 4 MG/2ML IJ SOLN: 4.0000 mg | Freq: Once | INTRAMUSCULAR | Status: DC | PRN

## 2021-10-06 MED ORDER — MIDAZOLAM HCL 2 MG/2ML IJ SOLN
INTRAMUSCULAR | Status: AC
  Filled 2021-10-06: qty 2

## 2021-10-06 MED ORDER — HYDROMORPHONE HCL 1 MG/ML IJ SOLN
0.5000 mg | INTRAMUSCULAR | Status: DC | PRN
  Administered 2021-10-06: 1 mg via INTRAVENOUS
  Administered 2021-10-06: 0.5 mg via INTRAVENOUS
  Administered 2021-10-07 – 2021-10-12 (×29): 1 mg via INTRAVENOUS
  Filled 2021-10-06 (×20): qty 1
  Filled 2021-10-06: qty 0.5
  Filled 2021-10-06 (×10): qty 1

## 2021-10-06 MED ORDER — FENTANYL CITRATE (PF) 100 MCG/2ML IJ SOLN: 25.0000 ug | INTRAMUSCULAR | Status: DC | PRN

## 2021-10-06 MED ORDER — CHLORHEXIDINE GLUCONATE 0.12 % MT SOLN
15.0000 mL | Freq: Once | OROMUCOSAL | Status: AC
  Administered 2021-10-06: 15 mL via OROMUCOSAL

## 2021-10-06 MED ORDER — ROCURONIUM BROMIDE 10 MG/ML (PF) SYRINGE
PREFILLED_SYRINGE | INTRAVENOUS | Status: DC | PRN
  Administered 2021-10-06: 30 mg via INTRAVENOUS

## 2021-10-06 MED ORDER — FENTANYL CITRATE (PF) 100 MCG/2ML IJ SOLN
INTRAMUSCULAR | Status: DC | PRN
Start: 2021-10-06 — End: 2021-10-06
  Administered 2021-10-06: 100 ug via INTRAVENOUS
  Administered 2021-10-06: 50 ug via INTRAVENOUS

## 2021-10-06 MED ORDER — PIPERACILLIN-TAZOBACTAM 3.375 G IVPB
3.3750 g | INTRAVENOUS | Status: AC
  Administered 2021-10-06: 3.375 g via INTRAVENOUS
  Filled 2021-10-06 (×2): qty 50

## 2021-10-06 MED ORDER — METOPROLOL TARTRATE 12.5 MG HALF TABLET
12.5000 mg | ORAL_TABLET | Freq: Two times a day (BID) | ORAL | Status: DC
  Administered 2021-10-07 – 2021-10-20 (×27): 12.5 mg via ORAL
  Filled 2021-10-06 (×29): qty 1

## 2021-10-06 MED ORDER — OXYCODONE HCL 5 MG PO TABS: 5.0000 mg | ORAL_TABLET | Freq: Once | ORAL | Status: DC | PRN

## 2021-10-06 MED ORDER — PROPOFOL 10 MG/ML IV BOLUS
INTRAVENOUS | Status: DC | PRN
  Administered 2021-10-06: 200 mg via INTRAVENOUS

## 2021-10-06 MED ORDER — SUCCINYLCHOLINE CHLORIDE 200 MG/10ML IV SOSY
PREFILLED_SYRINGE | INTRAVENOUS | Status: DC | PRN
  Administered 2021-10-06: 120 mg via INTRAVENOUS

## 2021-10-06 MED ORDER — ONDANSETRON HCL 4 MG/2ML IJ SOLN
INTRAMUSCULAR | Status: DC | PRN
  Administered 2021-10-06: 4 mg via INTRAVENOUS

## 2021-10-06 MED ORDER — LIDOCAINE 2% (20 MG/ML) 5 ML SYRINGE
INTRAMUSCULAR | Status: DC | PRN
  Administered 2021-10-06: 100 mg via INTRAVENOUS

## 2021-10-06 SURGICAL SUPPLY — 25 items
BAG COUNTER SPONGE SURGICOUNT (BAG) ×2 IMPLANT
BLADE CLIPPER SURG (BLADE) IMPLANT
BNDG GAUZE ELAST 4 BULKY (GAUZE/BANDAGES/DRESSINGS) IMPLANT
CANISTER SUCT 3000ML PPV (MISCELLANEOUS) ×2 IMPLANT
COVER SURGICAL LIGHT HANDLE (MISCELLANEOUS) ×2 IMPLANT
DRAPE LAPAROSCOPIC ABDOMINAL (DRAPES) IMPLANT
DRAPE LAPAROTOMY 100X72 PEDS (DRAPES) IMPLANT
DRSG PAD ABDOMINAL 8X10 ST (GAUZE/BANDAGES/DRESSINGS) IMPLANT
ELECT REM PT RETURN 9FT ADLT (ELECTROSURGICAL) ×2
ELECTRODE REM PT RTRN 9FT ADLT (ELECTROSURGICAL) ×1 IMPLANT
GAUZE SPONGE 4X4 12PLY STRL (GAUZE/BANDAGES/DRESSINGS) ×1 IMPLANT
GLOVE SURG ENC MOIS LTX SZ6 (GLOVE) ×2 IMPLANT
GLOVE SURG UNDER LTX SZ6.5 (GLOVE) ×2 IMPLANT
GOWN STRL REUS W/ TWL LRG LVL3 (GOWN DISPOSABLE) ×2 IMPLANT
GOWN STRL REUS W/TWL LRG LVL3 (GOWN DISPOSABLE) ×2
KIT BASIN OR (CUSTOM PROCEDURE TRAY) ×2 IMPLANT
KIT TURNOVER KIT B (KITS) ×2 IMPLANT
NS IRRIG 1000ML POUR BTL (IV SOLUTION) ×2 IMPLANT
PACK GENERAL/GYN (CUSTOM PROCEDURE TRAY) ×2 IMPLANT
PAD ARMBOARD 7.5X6 YLW CONV (MISCELLANEOUS) ×2 IMPLANT
PENCIL SMOKE EVACUATOR (MISCELLANEOUS) ×2 IMPLANT
SWAB COLLECTION DEVICE MRSA (MISCELLANEOUS) IMPLANT
SWAB CULTURE ESWAB REG 1ML (MISCELLANEOUS) IMPLANT
TOWEL GREEN STERILE (TOWEL DISPOSABLE) ×2 IMPLANT
TOWEL GREEN STERILE FF (TOWEL DISPOSABLE) ×2 IMPLANT

## 2021-10-06 NOTE — Transfer of Care (Signed)
Immediate Anesthesia Transfer of Care Note  Patient: Brett White  Procedure(s) Performed: INCISION AND DRAINAGE BACK ABSCESS  Patient Location: PACU  Anesthesia Type:General  Level of Consciousness: drowsy and patient cooperative  Airway & Oxygen Therapy: Patient Spontanous Breathing  Post-op Assessment: Report given to RN and Post -op Vital signs reviewed and stable  Post vital signs: Reviewed and stable  Last Vitals:  Vitals Value Taken Time  BP 139/79 10/06/21 1721  Temp    Pulse 94 10/06/21 1722  Resp 19 10/06/21 1722  SpO2 88 % 10/06/21 1722  Vitals shown include unvalidated device data.  Last Pain:  Vitals:   10/06/21 1553  TempSrc:   PainSc: 10-Worst pain ever      Patients Stated Pain Goal: 5 (10/05/21 1936)  Complications: No notable events documented.

## 2021-10-06 NOTE — Anesthesia Procedure Notes (Signed)
Procedure Name: Intubation Date/Time: 10/06/2021 4:37 PM Performed by: Georgia Duff, CRNA Pre-anesthesia Checklist: Patient identified, Emergency Drugs available, Suction available and Patient being monitored Patient Re-evaluated:Patient Re-evaluated prior to induction Oxygen Delivery Method: Circle System Utilized Preoxygenation: Pre-oxygenation with 100% oxygen Induction Type: IV induction Ventilation: Mask ventilation without difficulty Laryngoscope Size: Miller and 2 Grade View: Grade II Tube type: Oral Tube size: 7.5 mm Number of attempts: 1 Airway Equipment and Method: Stylet and Oral airway Placement Confirmation: ETT inserted through vocal cords under direct vision, positive ETCO2 and breath sounds checked- equal and bilateral Secured at: 22 cm Tube secured with: Tape Dental Injury: Teeth and Oropharynx as per pre-operative assessment

## 2021-10-06 NOTE — Anesthesia Preprocedure Evaluation (Addendum)
Anesthesia Evaluation  Patient identified by MRN, date of birth, ID band Patient awake    Reviewed: Allergy & Precautions, NPO status , Patient's Chart, lab work & pertinent test results  Airway Mallampati: III  TM Distance: >3 FB Neck ROM: Full    Dental  (+) Missing, Poor Dentition, Chipped   Pulmonary neg pulmonary ROS,    Pulmonary exam normal breath sounds clear to auscultation       Cardiovascular hypertension, Pt. on medications + Peripheral Vascular Disease  Normal cardiovascular exam Rhythm:Regular Rate:Normal     Neuro/Psych negative neurological ROS  negative psych ROS   GI/Hepatic negative GI ROS, Neg liver ROS,   Endo/Other  diabetes, Poorly Controlled, Type 2, Insulin Dependent  Renal/GU negative Renal ROS  negative genitourinary   Musculoskeletal negative musculoskeletal ROS (+)   Abdominal   Peds negative pediatric ROS (+)  Hematology  (+) anemia ,   Anesthesia Other Findings   Reproductive/Obstetrics negative OB ROS                            Anesthesia Physical Anesthesia Plan  ASA: 3  Anesthesia Plan: General   Post-op Pain Management: Minimal or no pain anticipated   Induction: Intravenous  PONV Risk Score and Plan: 2 and Ondansetron, Dexamethasone and Treatment may vary due to age or medical condition  Airway Management Planned: Oral ETT  Additional Equipment:   Intra-op Plan:   Post-operative Plan: Extubation in OR  Informed Consent: I have reviewed the patients History and Physical, chart, labs and discussed the procedure including the risks, benefits and alternatives for the proposed anesthesia with the patient or authorized representative who has indicated his/her understanding and acceptance.     Dental advisory given  Plan Discussed with: CRNA and Surgeon  Anesthesia Plan Comments:         Anesthesia Quick Evaluation

## 2021-10-06 NOTE — Progress Notes (Signed)
Dr. Bridgett Larsson and RR RN Anselm Pancoast notified that the patient has complained of 8/10 sharp shooting chest pain that radiates to left arm throughout the day since noon.  He did have an I&D on his left shoulder/back today. EKG NSR.  Dr. Bridgett Larsson has ordered cyclic troponins.  Will continue to monitor patient and report any changes.

## 2021-10-06 NOTE — Op Note (Signed)
Operative Note  Brett White  [id number]  [id number]  10/06/2021   Surgeon: Phylliss Blakes MD   Procedure performed: Excision and debridement of left upper back subcutaneous abscess and phlegmon   Preop diagnosis: Left upper back abscess resistant to previous drainage and antibiotic therapy Post-op diagnosis/intraop findings: Phlegmonous change in the deep soft tissues with the small pockets of pus that were evacuated   Specimens: Cultures as well as tissue culture Retained items: Kerlix packing EBL: Minimal cc Complications: none   Description of procedure: After obtaining informed consent the patient was taken to the operating room and placed supine on operating room table where general endotracheal anesthesia was initiated, preoperative antibiotics were administered, SCDs applied, and a formal timeout was performed.  He was then repositioned in the right lateral decubitus with all pressure points appropriately padded.  The upper back was prepped and draped in usual sterile fashion.  The abscess site was identified, this consists of an approximately 4 cm diameter area of desquamated skin with punctate seepage of purulent fluid in multiple areas surrounded by an approximately 15 cm area of firm induration of the subcutaneous tissues with erythema.  An incision was made sharply and the desquamated area and the soft tissues probed with culture swabs.  The cautery was then used to excise an approximately 2 cm diameter core of skin and subcutaneous tissue which was also sent for tissue pathology and culture.  The deep soft tissues were bluntly probed and a few small loculations of purulent fluid were evacuated.  There was really no large singular abscess cavity rather phlegmonous change throughout the soft tissue.  The wound was irrigated with warm sterile saline and then packed with a saline dampened Kerlix followed by application of dry gauze and tape.  The patient was then awakened, extubated and  taken to PACU in stable condition.    All counts were correct at the completion of the case.

## 2021-10-06 NOTE — Consult Note (Signed)
Brett White September 07, 1961  [id number].    Requesting MD: Dr. 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  He noted this area on Friday of last week and states that it has continued to worsen.  When he was in the ED, this was I&D and he was admitted for IV abx therapy, a long acting abx called dalbavancin.  He states initially this area began to feel better, but over the last 2 days it has worsened.  His WBC is continuing to improve and he is not running any fevers.  We have been asked to see him today for further evaluation given worsening pain and appearance of this area.  ROS: ROS: Please see HPI, otherwise all other systems have been reviewed and are negative.  Family History  Problem Relation Age of Onset   Heart attack Neg Hx     Past Medical History:  Diagnosis Date   Diabetes mellitus without complication [facility])     Past Surgical History:  Procedure Laterality Date   AMPUTATION Left 02/04/2021   Procedure: LEFT GREAT TOE AMPUTATION;  Surgeon: Newt Minion, MD;  Location: [place];  Service: Orthopedics;  Laterality: Left;   AMPUTATION Left 02/10/2021   Procedure: LEFT FOOT 1ST RAY AMPUTATION;  Surgeon: Newt Minion, MD;  Location: [place];  Service: Orthopedics;  Laterality: Left;   AMPUTATION Left 03/22/2021   Procedure: LEFT BELOW KNEE AMPUTATION;  Surgeon: Newt Minion, MD;  Location: [place];  Service: Orthopedics;  Laterality: Left;   APPENDECTOMY     BACK SURGERY     I & D EXTREMITY Left 03/03/2021   Procedure: LEFT FOOT DEBRIDEMENT;  Surgeon: Newt Minion, MD;  Location: [place];  Service: Orthopedics;  Laterality: Left;   removal of back cyst     TONSILLECTOMY      Social History:  reports that he has never smoked. He has never used smokeless  tobacco. He reports that he does not currently use alcohol. He reports that he does not currently use drugs.  Allergies: No Known Allergies  Medications Prior to Admission  Medication Sig Dispense Refill   aspirin 81 MG chewable tablet Chew 1 tablet (81 mg total) by mouth daily. 30 tablet 0   atorvastatin (LIPITOR) 10 MG tablet Take 1 tablet (10 mg total) by mouth daily. 30 tablet 0   DULoxetine (CYMBALTA) 30 MG capsule Take 30 mg by mouth daily.     gabapentin (NEURONTIN) 300 MG capsule Take 1 capsule (300 mg total) by mouth 2 (two) times daily. 60 capsule 0   insulin glargine (LANTUS) 100 UNIT/ML injection Inject 0.05 mLs (5 Units total) into the skin 2 (two) times daily. 10 mL 11   losartan (COZAAR) 25 MG tablet Take 1 tablet (25 mg total) by mouth daily. 30 tablet 0   metFORMIN (GLUCOPHAGE) 500 MG tablet Take 500 mg by mouth daily with breakfast.     NOVOLOG FLEXPEN 100 UNIT/ML FlexPen Inject 5 Units into the skin with breakfast, with lunch, and with evening meal.     pantoprazole (PROTONIX) 40 MG tablet Take 1 tablet (40 mg total) by mouth daily.     doxycycline (VIBRAMYCIN) 100 MG capsule Take 1 capsule (100 mg total) by mouth 2 (two) times daily.  One po bid x 7 days (Patient not taking: Reported on 10/03/2021) 14 capsule 0   methocarbamol (ROBAXIN) 500 MG tablet Take 1 tablet (500 mg total) by mouth every 6 (six) hours as needed for muscle spasms. (Patient not taking: Reported on 10/03/2021)     oxyCODONE-acetaminophen (PERCOCET/ROXICET) 5-325 MG tablet Take 1 tablet by mouth every 6 (six) hours as needed for severe pain. (Patient not taking: Reported on 10/03/2021) 30 tablet 0     Physical Exam: Blood pressure 129/68, pulse 77, temperature 98.4 F (36.9 C), temperature source Oral, resp. rate 18, height 6\' 2"  (1.88 m), weight 95.3 kg, SpO2 95 %. General: pleasant, WD, WN white male who is laying in bed in NAD HEENT: head is normocephalic, atraumatic.  Sclera are noninjected.  PERRL.  Ears  and nose without any masses or lesions.  Mouth is pink and moist Heart: regular, rate, and rhythm.  Normal s1,s2. No obvious murmurs, gallops, or rubs noted.  Palpable radial and pedal pulses bilaterally Lungs: CTAB, no wheezes, rhonchi, or rales noted.  Respiratory effort nonlabored Abd: soft, NT, ND, +BS, no masses, hernias, or organomegaly Skin: warm and dry with no masses, lesions, or rashes.  Left posterior scapula area with significant abscess.  Induration and erythema present with more intense erythema centrally as well as multiple small openings that are draining purulent drainage.  Psych: A&Ox3 with an appropriate affect.   Results for orders placed or performed during the hospital encounter of 10/03/21 (from the past 48 hour(s))  CBG monitoring, ED     Status: Abnormal   Collection Time: 10/04/21 11:44 AM  Result Value Ref Range   Glucose-Capillary 362 (H) 70 - 99 mg/dL    Comment: Glucose reference range applies only to samples taken after fasting for at least 8 hours.  CBG monitoring, ED     Status: Abnormal   Collection Time: 10/04/21  4:15 PM  Result Value Ref Range   Glucose-Capillary 316 (H) 70 - 99 mg/dL    Comment: Glucose reference range applies only to samples taken after fasting for at least 8 hours.  CBG monitoring, ED     Status: Abnormal   Collection Time: 10/04/21  8:15 PM  Result Value Ref Range   Glucose-Capillary 224 (H) 70 - 99 mg/dL    Comment: Glucose reference range applies only to samples taken after fasting for at least 8 hours.  MRSA Next Gen by PCR, Nasal     Status: None   Collection Time: 10/04/21 10:41 PM   Specimen: Nasal Mucosa; Nasal Swab  Result Value Ref Range   MRSA by PCR Next Gen NOT DETECTED NOT DETECTED    Comment: (NOTE) The GeneXpert MRSA Assay (FDA approved for NASAL specimens only), is one component of a comprehensive MRSA colonization surveillance program. It is not intended to diagnose MRSA infection nor to guide or monitor  treatment for MRSA infections. Test performance is not FDA approved in patients less than 49 years old. Performed at [facility], [place] [address] [place], [place] [address]   Glucose, capillary     Status: Abnormal   Collection Time: 10/05/21 12:34 AM  Result Value Ref Range   Glucose-Capillary 227 (H) 70 - 99 mg/dL    Comment: Glucose reference range applies only to samples taken after fasting for at least 8 hours.  Basic metabolic panel     Status: Abnormal   Collection Time: 10/05/21  1:35 AM  Result Value Ref Range   Sodium 130 (  L) 135 - 145 mmol/L   Potassium 3.6 3.5 - 5.1 mmol/L   Chloride 97 (L) 98 - 111 mmol/L   CO2 22 22 - 32 mmol/L   Glucose, Bld 212 (H) 70 - 99 mg/dL    Comment: Glucose reference range applies only to samples taken after fasting for at least 8 hours.   BUN 34 (H) 6 - 20 mg/dL   Creatinine, Ser 1.65 (H) 0.61 - 1.24 mg/dL   Calcium 8.5 (L) 8.9 - 10.3 mg/dL   GFR, Estimated 48 (L) >60 mL/min    Comment: (NOTE) Calculated using the CKD-EPI Creatinine Equation (2021)    Anion gap 11 5 - 15    Comment: Performed at [facility] [address], [place], [place] [address]  CBC     Status: Abnormal   Collection Time: 10/05/21  1:35 AM  Result Value Ref Range   WBC 16.5 (H) 4.0 - 10.5 K/uL   RBC 4.48 4.22 - 5.81 MIL/uL   Hemoglobin 12.2 (L) 13.0 - 17.0 g/dL   HCT 38.0 (L) 39.0 - 52.0 %   MCV 84.8 80.0 - 100.0 fL   MCH 27.2 26.0 - 34.0 pg   MCHC 32.1 30.0 - 36.0 g/dL   RDW 12.7 11.5 - 15.5 %   Platelets 292 150 - 400 K/uL   nRBC 0.0 0.0 - 0.2 %    Comment: Performed at [facility], [place] [address] [place], [place] [address]  Magnesium     Status: None   Collection Time: 10/05/21  1:35 AM  Result Value Ref Range   Magnesium 2.0 1.7 - 2.4 mg/dL    Comment: Performed at [facility] [address] [place], [place] [address]  C-reactive protein     Status: Abnormal   Collection Time: 10/05/21  1:35 AM  Result Value Ref Range    CRP 25.7 (H) <1.0 mg/dL    Comment: Performed at [facility] [address], [place],  [address]  Sedimentation rate     Status: Abnormal   Collection Time: 10/05/21  2:51 AM  Result Value Ref Range   Sed Rate 66 (H) 0 - 16 mm/hr    Comment: Performed at [facility] [address], [place], [place] [address]  Glucose, capillary     Status: Abnormal   Collection Time: 10/05/21  3:57 AM  Result Value Ref Range   Glucose-Capillary 142 (H) 70 - 99 mg/dL    Comment: Glucose reference range applies only to samples taken after fasting for at least 8 hours.  Glucose, capillary     Status: Abnormal   Collection Time: 10/05/21  9:02 AM  Result Value Ref Range   Glucose-Capillary 150 (H) 70 - 99 mg/dL    Comment: Glucose reference range applies only to samples taken after fasting for at least 8 hours.  Glucose, capillary     Status: Abnormal   Collection Time: 10/05/21 12:53 PM  Result Value Ref Range   Glucose-Capillary 246 (H) 70 - 99 mg/dL    Comment: Glucose reference range applies only to samples taken after fasting for at least 8 hours.  Urinalysis, Routine w reflex microscopic Urine, Clean Catch     Status: Abnormal   Collection Time: 10/05/21  2:38 PM  Result Value Ref Range   Color, Urine YELLOW YELLOW   APPearance HAZY (A) CLEAR   Specific Gravity, Urine 1.023 1.005 - 1.030   pH 5.0 5.0 -  8.0   Glucose, UA 150 (A) NEGATIVE mg/dL   Hgb urine dipstick NEGATIVE NEGATIVE   Bilirubin Urine NEGATIVE NEGATIVE   Ketones, ur 5 (A) NEGATIVE mg/dL   Protein, ur 100 (A) NEGATIVE mg/dL   Nitrite NEGATIVE NEGATIVE   Leukocytes,Ua NEGATIVE NEGATIVE   RBC / HPF 0-5 0 - 5 RBC/hpf   WBC, UA 0-5 0 - 5 WBC/hpf   Bacteria, UA RARE (A) NONE SEEN   Squamous Epithelial / LPF 0-5 0 - 5    Comment: Performed at [facility], [place] [address] [place], [place] [address]  Urine rapid drug screen (hosp performed)     Status: Abnormal   Collection Time: 10/05/21  2:40 PM   Result Value Ref Range   Opiates POSITIVE (A) NONE DETECTED   Cocaine NONE DETECTED NONE DETECTED   Benzodiazepines NONE DETECTED NONE DETECTED   Amphetamines NONE DETECTED NONE DETECTED   Tetrahydrocannabinol NONE DETECTED NONE DETECTED   Barbiturates NONE DETECTED NONE DETECTED    Comment: (NOTE) DRUG SCREEN FOR MEDICAL PURPOSES ONLY.  IF CONFIRMATION IS NEEDED FOR ANY PURPOSE, NOTIFY LAB WITHIN 5 DAYS.  LOWEST DETECTABLE LIMITS FOR URINE DRUG SCREEN Drug Class                     Cutoff (ng/mL) Amphetamine and metabolites    1000 Barbiturate and metabolites    200 Benzodiazepine                 [id number] Tricyclics and metabolites     300 Opiates and metabolites        300 Cocaine and metabolites        300 THC                            50 Performed at [facility], [place] [address] [place], [place] [address]   Glucose, capillary     Status: Abnormal   Collection Time: 10/05/21  4:38 PM  Result Value Ref Range   Glucose-Capillary 350 (H) 70 - 99 mg/dL    Comment: Glucose reference range applies only to samples taken after fasting for at least 8 hours.  Glucose, capillary     Status: Abnormal   Collection Time: 10/05/21  7:32 PM  Result Value Ref Range   Glucose-Capillary 312 (H) 70 - 99 mg/dL    Comment: Glucose reference range applies only to samples taken after fasting for at least 8 hours.  Glucose, capillary     Status: Abnormal   Collection Time: 10/06/21 12:07 AM  Result Value Ref Range   Glucose-Capillary 141 (H) 70 - 99 mg/dL    Comment: Glucose reference range applies only to samples taken after fasting for at least 8 hours.  Basic metabolic panel     Status: Abnormal   Collection Time: 10/06/21  1:58 AM  Result Value Ref Range   Sodium 129 (L) 135 - 145 mmol/L   Potassium 3.8 3.5 - 5.1 mmol/L   Chloride 96 (L) 98 - 111 mmol/L   CO2 24 22 - 32 mmol/L   Glucose, Bld 168 (H) 70 - 99 mg/dL    Comment: Glucose reference range applies only to samples taken  after fasting for at least 8 hours.   BUN 28 (H) 6 - 20 mg/dL   Creatinine, Ser 1.04 0.61 - 1.24 mg/dL   Calcium 8.3 (L) 8.9 - 10.3 mg/dL   GFR, Estimated >60 >  60 mL/min    Comment: (NOTE) Calculated using the CKD-EPI Creatinine Equation (2021)    Anion gap 9 5 - 15    Comment: Performed at [facility], [place] [address] [place], [place] [address]  CBC     Status: Abnormal   Collection Time: 10/06/21  1:58 AM  Result Value Ref Range   WBC 12.8 (H) 4.0 - 10.5 K/uL   RBC 3.89 (L) 4.22 - 5.81 MIL/uL   Hemoglobin 10.6 (L) 13.0 - 17.0 g/dL   HCT 32.9 (L) 39.0 - 52.0 %   MCV 84.6 80.0 - 100.0 fL   MCH 27.2 26.0 - 34.0 pg   MCHC 32.2 30.0 - 36.0 g/dL   RDW 12.8 11.5 - 15.5 %   Platelets 312 150 - 400 K/uL   nRBC 0.0 0.0 - 0.2 %    Comment: Performed at [facility], [place] [address], [place], [place] [address]  Glucose, capillary     Status: Abnormal   Collection Time: 10/06/21  4:42 AM  Result Value Ref Range   Glucose-Capillary 184 (H) 70 - 99 mg/dL    Comment: Glucose reference range applies only to samples taken after fasting for at least 8 hours.  Glucose, capillary     Status: Abnormal   Collection Time: 10/06/21  8:57 AM  Result Value Ref Range   Glucose-Capillary 174 (H) 70 - 99 mg/dL    Comment: Glucose reference range applies only to samples taken after fasting for at least 8 hours.   DG Abd 1 View  Result Date: 10/04/2021 CLINICAL DATA:  Nausea, vomiting, constipation. EXAM: ABDOMEN - 1 VIEW COMPARISON:  None. FINDINGS: The bowel gas pattern is normal. Mild amount of stool seen in the right colon. No radio-opaque calculi or other significant radiographic abnormality are seen. IMPRESSION: Mild stool burden.  No abnormal bowel dilatation. Electronically Signed   By: Marijo Conception M.D.   On: 10/04/2021 15:12      Assessment/Plan Left posterior back abscess The patient has been seen and examined.  This area on his back is certainly worse based on his statements as  well as pictures from when he came in.  He will definitely need operative intervention to drain and debride this area.  We discussed the procedure including risks and complications such as bleeding, need to return to the OR for further debridement, delayed wound healing secondary to his poorly controlled DM.  He understands and agrees to proceed.  He is NPO.  I have discussed him with the primary service.  He is on a long-acting abx regimen, but we will add zosyn as a pre-operative abx regiment.  We will also obtain cultures in the operating room as well.     FEN - NPO/IVFs VTE - SCDs ID - dalbavancin, zosyn on call  Moderate Medical Decision Making  Henreitta Cea, [facility] 10/06/2021, 11:02 AM Please see Amion for pager number during day hours 7:00am-4:30pm or 7:00am -11:30am on weekends

## 2021-10-06 NOTE — Progress Notes (Signed)
Inpatient Diabetes Program Recommendations  AACE/ADA: New Consensus Statement on Inpatient Glycemic Control   Target Ranges:  Prepandial:   less than 140 mg/dL      Peak postprandial:   less than 180 mg/dL (1-2 hours)      Critically ill patients:  140 - 180 mg/dL    Latest Reference Range & Units 10/06/21 00:07 10/06/21 04:42  Glucose-Capillary 70 - 99 mg/dL 638 (H) 466 (H)    Latest Reference Range & Units 10/05/21 09:02 10/05/21 12:53 10/05/21 16:38 10/05/21 19:32  Glucose-Capillary 70 - 99 mg/dL 599 (H) 357 (H) 017 (H) 312 (H)   Review of Glycemic Control  Diabetes history: DM2 Outpatient Diabetes medications: Lantus 5 units BID (not taken in 1 month), Novolog 5 units TID (not taken in 1 month), Metformin 500 mg daily Current orders for Inpatient glycemic control: Semglee 20 units QHS, Novolog 0-15 units Q4H  Inpatient Diabetes Program Recommendations:    Insulin: Please consider increasing Semglee to 30 units QHS and adding Novolog 6 units TID with meals for meal coverage if patient eats at least 50% of meals. If provider prefers to order 70/30 while inpatient, recommend discontinuing Semglee and ordering 70/30 21 units BID (dose would provide 29.4 units for basal and 12.6 units for meal coverage per day).  HbgA1C:  A1C 12.2% on 10/04/21 indicating an average glucose of 303 mg/dl over the past 2-3 months.   Outpatient DM needs: Since patient is not able to afford Lantus and Novolog insulin, would recommend to change to Novolin 70/30 insulin (pens) since it is much more affordable ($43 per box of 5 insulin pens at [facility]).  At time of discharge, please provide Rx for Novolin 70/30 (#[id number]) and insulin pen needles [phone]).   Thanks, Orlando Penner, RN, MSN, CDE Diabetes Coordinator Inpatient Diabetes Program [phone] (Team Pager from 8am to 5pm)

## 2021-10-06 NOTE — Care Management Important Message (Signed)
Important Message  Patient Details  Name: Brett White MRN: [id number] Date of Birth: [birth year]   Medicare Important Message Given:  Yes     Dorena Bodo 10/06/2021, 3:24 PM

## 2021-10-06 NOTE — Progress Notes (Signed)
PROGRESS NOTE    Brett White  [id number] DOB: December 03, 1960 DOA: 10/03/2021 PCP: System, Provider Not In    Chief Complaint  Patient presents with   Wound Infection   Abscess    Brief Narrative:  Brett White is a pleasant 61 y.o. 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  The patient reports noticing some pain and swelling near the left scapula approximately 2 days ago thought he may have been bit by a spider though he did not see any insect.  He has had some subjective fevers and chills associated with this.   Subjective:  Having increased pain and drainage, has been up all night due to increased pain  N/v resolved,  reports dizziness is chronic Labs has improved, fever resolved  Assessment & Plan:   Principal Problem:   Cellulitis Active Problems:   Uncontrolled type 2 diabetes mellitus with hyperglycemia (HCC)   Back abscess   AKI (acute kidney injury) ([place])  Abscess/cellulitis/sepsis, present on admission With fever, tachycardia, leukocytosis -Status post I&D left shoulder abscess, I do not see wound culture sent, blood culture no growth, negative MRSA screening -He received dalbavancin in the ED -although labs has improved, wound  appears getting worse with increase pain and drainage,  general surgery consulted    Insulin-dependent type 2 diabetes, uncontrolled, with hyperglycemia A1c 12.2 Reports can not afford insulin, only takes metformin at home Start insulin here, continue to adjust Diabetes RN consulted  Peripheral neuropathy Significantly decreased sensation right lower leg/foot  N/V ( present on admission), resolved Denies ab pain, kub + constipation, start stool softener   AKI From  dehydration? 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 He is able to give history but not able tell me the right month or year  Depression continue Cymbalta  FTT, appear to have poor social supports, reports pcp is in the process getting him to a SNF, will get PT eval and social worker consult     Body mass index is 26.96 kg/m.Marland Kitchen      Unresulted Labs (From admission, onward)     Start     Ordered   10/07/21 0500  CBC with Differential/Platelet  Tomorrow morning,   R       Question:  Specimen collection method  Answer:  Lab=Lab collect   10/06/21 1621   10/07/21 [id number]  Basic metabolic panel  Daily,   R     Question:  Specimen collection method  Answer:  Lab=Lab collect   10/06/21 1621              DVT prophylaxis: SCDs Start: 10/04/21 0105   Code Status: full Family Communication: patient Disposition:   Status is: Inpatient  Dispo: The patient is from: home, reports Relocated from [place] to [employer], does not have good social support here              Anticipated d/c is to: SNF  Anticipated d/c date is: TBD                Consultants:  General surgery  Procedures:  I/D abscess in the ED Wound debridement in OR on 1/6  Antimicrobials:    Anti-infectives (From admission, onward)    Start     Dose/Rate Route Frequency Ordered Stop   10/06/21 1145  [MAR Hold]  piperacillin-tazobactam (ZOSYN) IVPB 3.375 g        (MAR Hold since Fri 10/06/2021 at 1546.Hold Reason: Transfer to a Procedural area)   3.375 g 12.5 mL/hr over 240 Minutes Intravenous On call to O.R. 10/06/21 1046 10/07/21 0559    10/03/21 1745  dalbavancin (DALVANCE) 1,500 mg in dextrose 5 % 500 mL IVPB        1,500 mg 967.7 mL/hr over 31 Minutes Intravenous Once 10/03/21 1736 10/03/21 2234          Objective: Vitals:   10/06/21 0440 10/06/21 0853 10/06/21 1256 10/06/21 1547  BP: 129/79 129/68 (!) 147/91 (!) 170/89  Pulse: 95 77 78 89  Resp: 19 18 20 18   Temp:  98.4 F (36.9 C) 98.5 F (36.9 C) 98.1 F (36.7 C)  TempSrc:  Oral Oral Oral  SpO2: 93% 95% 94% 95%  Weight:    95.3 kg  Height:    6\' 2"  (1.88 m)    Intake/Output Summary (Last 24 hours) at 10/06/2021 1621 Last data filed at 10/05/2021 2142 Gross per 24 hour  Intake 490 ml  Output --  Net 490 ml   Filed Weights   10/03/21 1317 10/06/21 1547  Weight: 95.3 kg 95.3 kg    Examination:  General exam: does not look comfortable, chronically ill appearing ,alert, awake, communicative Respiratory system: Clear to auscultation. Respiratory effort normal. Cardiovascular system:  RRR.  Gastrointestinal system: Abdomen is nondistended, soft and nontender.  Normal bowel sounds heard. Central nervous system: Alert and orientedx2, not able to tell me the right year /month.  Extremities:  s/p left BKA,  Skin: cellulitis/abscess appear worse with increased erythema and drainage Psychiatry: Judgement and insight appear normal. Mood & affect appropriate.     Data Reviewed: I have personally reviewed following labs and imaging studies  CBC: Recent Labs  Lab 10/03/21 1349 10/04/21 0218 10/05/21 0135 10/06/21 0158  WBC 16.6* 21.9* 16.5* 12.8*  NEUTROABS 14.3*  --   --   --   HGB 14.0 12.6* 12.2* 10.6*  HCT 42.0 38.0* 38.0* 32.9*  MCV 83.3 84.3 84.8 84.6  PLT 320 290 292 [id number]    Basic Metabolic Panel: Recent Labs  Lab 10/03/21 1349 10/04/21 0218 10/05/21 0135 10/06/21 0158  NA 128* 129* 130* 129*  K 4.1 3.7 3.6 3.8  CL 95* 94* 97* 96*  CO2 22 25 22 24   GLUCOSE 517* 321* 212* 168*  BUN 17 21* 34* 28*  CREATININE 1.39* 1.77* 1.65*  1.04  CALCIUM 8.8* 8.5* 8.5* 8.3*  MG  --   --  2.0  --     GFR: Estimated Creatinine Clearance: 90 mL/min (by C-G formula based on SCr of 1.04 mg/dL).  Liver Function Tests: Recent Labs  Lab 10/03/21 1349  AST 14*  ALT 11  ALKPHOS 108  BILITOT 1.1  PROT 7.6  ALBUMIN 3.1*    CBG: Recent Labs  Lab 10/06/21 0007 10/06/21 0442 10/06/21 0857 10/06/21 1216 10/06/21 1549  GLUCAP 141* 184* 174* 125* 125*     Recent Results (from the past 240 hour(s))  Blood culture (routine  single)     Status: None (Preliminary result)   Collection Time: 10/03/21  1:49 PM   Specimen: Site Not Specified; Blood  Result Value Ref Range Status   Specimen Description SITE NOT SPECIFIED  Final   Special Requests   Final    BOTTLES DRAWN AEROBIC AND ANAEROBIC Blood Culture adequate volume   Culture   Final    NO GROWTH 3 DAYS Performed at [facility], [address] [place], [place] [address]    Report Status PENDING  Incomplete  Resp Panel by RT-PCR (Flu A&B, Covid) Nasopharyngeal Swab     Status: None   Collection Time: 10/04/21 12:41 AM   Specimen: Nasopharyngeal Swab; Nasopharyngeal(NP) swabs in vial transport medium  Result Value Ref Range Status   SARS Coronavirus 2 by RT PCR NEGATIVE NEGATIVE Final    Comment: (NOTE) SARS-CoV-2 target nucleic acids are NOT DETECTED.  The SARS-CoV-2 RNA is generally detectable in upper respiratory specimens during the acute phase of infection. The lowest concentration of SARS-CoV-2 viral copies this assay can detect is 138 copies/mL. A negative result does not preclude SARS-Cov-2 infection and should not be used as the sole basis for treatment or other patient management decisions. A negative result may occur with  improper specimen collection/handling, submission of specimen other than nasopharyngeal swab, presence of viral mutation(s) within the areas targeted by this assay, and inadequate number of viral copies(<138 copies/mL). A  negative result must be combined with clinical observations, patient history, and epidemiological information. The expected result is Negative.  Fact Sheet for Patients:  EntrepreneurPulse.com.au  Fact Sheet for Healthcare Providers:  IncredibleEmployment.be  This test is no t yet approved or cleared by the [place] FDA and  has been authorized for detection and/or diagnosis of SARS-CoV-2 by FDA under an Emergency Use Authorization (EUA). This EUA will remain  in effect (meaning this test can be used) for the duration of the COVID-19 declaration under Section 564(b)(1) of the Act, 21 U.S.C.section 360bbb-3(b)(1), unless the authorization is terminated  or revoked sooner.       Influenza A by PCR NEGATIVE NEGATIVE Final   Influenza B by PCR NEGATIVE NEGATIVE Final    Comment: (NOTE) The Xpert Xpress SARS-CoV-2/FLU/RSV plus assay is intended as an aid in the diagnosis of influenza from Nasopharyngeal swab specimens and should not be used as a sole basis for treatment. Nasal washings and aspirates are unacceptable for Xpert Xpress SARS-CoV-2/FLU/RSV testing.  Fact Sheet for Patients: EntrepreneurPulse.com.au  Fact Sheet for Healthcare Providers: IncredibleEmployment.be  This test is not yet approved or cleared by the [place] FDA and has been authorized for detection and/or diagnosis of SARS-CoV-2 by FDA under an Emergency Use Authorization (EUA). This EUA will remain in effect (meaning this test can be used) for the duration of the COVID-19 declaration under Section 564(b)(1) of the Act, 21 U.S.C. section 360bbb-3(b)(1), unless the authorization is terminated or revoked.  Performed at [facility], [place] [address], [place], [place] [address]   MRSA Next Gen by PCR, Nasal     Status: None   Collection Time: 10/04/21 10:41 PM   Specimen: Nasal Mucosa; Nasal Swab  Result Value Ref Range  Status   MRSA by PCR Next Gen NOT DETECTED NOT DETECTED Final    Comment: (NOTE) The GeneXpert MRSA Assay (FDA approved for NASAL specimens only), is one component of a comprehensive MRSA colonization surveillance program. It is not intended to diagnose MRSA infection nor to guide or  monitor treatment for MRSA infections. Test performance is not FDA approved in patients less than 42 years old. Performed at [facility], [place] [address] [place], [place] [address]          Radiology Studies: VAS [place] ABI WITH/WO TBI  Result Date: 10/06/2021  LOWER EXTREMITY DOPPLER STUDY Patient Name:  Brett White  Date of Exam:   10/06/2021 Medical Rec #: [id number]       Accession #:    [id number] Date of Birth: [birth year]       Patient Gender: M Patient Age:   71 years Exam Location:  [facility] Procedure:      VAS [place] ABI WITH/WO TBI Referring Phys: Annamaria Boots Sina Lucchesi --------------------------------------------------------------------------------  Indications: PVD w/ intermittent claudication. High Risk Factors: Diabetes.  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 Comparison Study: prior Performing Technologist: Archie Patten RVS  Examination Guidelines: A complete evaluation includes at minimum, Doppler waveform signals and systolic blood pressure reading at the level of bilateral brachial, anterior tibial, and posterior tibial arteries, when vessel segments are accessible. Bilateral testing is considered an integral part of a complete examination. Photoelectric Plethysmograph (PPG) waveforms and toe systolic pressure readings are included as required and additional duplex testing as needed. Limited examinations for reoccurring indications may be performed as noted.  ABI Findings: +---------+------------------+-----+---------+--------+  Right     Rt Pressure (mmHg) Index Waveform  Comment   +---------+------------------+-----+---------+--------+  Brachial  150                      triphasic            +---------+------------------+-----+---------+--------+  PTA       196                1.31  triphasic           +---------+------------------+-----+---------+--------+  DP        224                1.49  triphasic           +---------+------------------+-----+---------+--------+  Great Toe 203                1.35                      +---------+------------------+-----+---------+--------+ +--------+------------------+-----+---------+-------+  Left     Lt Pressure (mmHg) Index Waveform  Comment  +--------+------------------+-----+---------+-------+  Brachial 147                      triphasic          +--------+------------------+-----+---------+-------+  PTA                                         bka      +--------+------------------+-----+---------+-------+  DP                                          bka      +--------+------------------+-----+---------+-------+ +-------+-----------+-----------+------------+------------+  ABI/TBI Today's ABI Today's TBI Previous ABI Previous TBI  +-------+-----------+-----------+------------+------------+  Right   1.49        1.35                                   +-------+-----------+-----------+------------+------------+  Left    bka                                                +-------+-----------+-----------+------------+------------+  Summary: Right: Resting right ankle-brachial index indicates noncompressible right lower extremity arteries. Left: Bka.  *See table(s) above for measurements and observations.     Preliminary         Scheduled Meds:  [MAR Hold] acetaminophen  325 mg Oral Once   [MAR Hold] aspirin  81 mg Oral Daily   [MAR Hold] atorvastatin  10 mg Oral Daily   [MAR Hold] DULoxetine  30 mg Oral Daily   [MAR Hold] gabapentin  300 mg Oral BID   [MAR Hold] insulin aspart  0-15 Units Subcutaneous Q4H   [MAR Hold] insulin glargine-yfgn  20 Units Subcutaneous QHS   [START ON 10/07/2021] metoprolol tartrate  12.5 mg Oral BID   [MAR Hold] pantoprazole  40  mg Oral Daily   pneumococcal 23 valent vaccine  0.5 mL Intramuscular Tomorrow-1000   [MAR Hold] senna-docusate  1 tablet Oral BID   [MAR Hold] sodium chloride flush  10-40 mL Intracatheter Q12H   [MAR Hold] sodium chloride flush  3 mL Intravenous Q12H   Continuous Infusions:  lactated ringers     [MAR Hold] piperacillin-tazobactam (ZOSYN)  IV     [MAR Hold] sodium chloride Stopped (10/03/21 2150)     LOS: 3 days   Time spent: 74mins Greater than 50% of this time was spent in counseling, explanation of diagnosis, planning of further management, and coordination of care.   Voice Recognition Viviann Spare dictation system was used to create this note, attempts have been made to correct errors. Please contact the author with questions and/or clarifications.   [name] Reasons, MD PhD FACP [facility]  Available via Epic secure chat 7am-7pm for nonurgent issues Please page for urgent issues To page the attending provider between 7A-7P or the covering provider during after hours 7P-7A, please log into the web site [email or web address] and access using universal [place] password for that web site. If you do not have the password, please call the hospital operator.    10/06/2021, 4:21 PM

## 2021-10-06 NOTE — Progress Notes (Signed)
Site of cellulitis on left scapula is worse. Area is hot to touch, more edematous,+1, bright red, and copious purulent drainage noted. Dressing changed twice during shift to keep area clean and wick moisture from site. Area feel taut, more taut than start of shift. Pain is constantly 10/10. Patient hopeful to have I&D performed again today to receive relief from pressure, pain, and discomfort.

## 2021-10-06 NOTE — Progress Notes (Signed)
ABI has been completed.   Preliminary results in CV Proc.   Aundra Millet Nicolasa Milbrath 10/06/2021 1:29 PM

## 2021-10-06 NOTE — Anesthesia Postprocedure Evaluation (Signed)
Anesthesia Post Note  Patient: Brett White  Procedure(s) Performed: INCISION AND DRAINAGE BACK ABSCESS     Patient location during evaluation: PACU Anesthesia Type: General Level of consciousness: awake and alert Pain management: pain level controlled Vital Signs Assessment: post-procedure vital signs reviewed and stable Respiratory status: spontaneous breathing, nonlabored ventilation, respiratory function stable and patient connected to nasal cannula oxygen Cardiovascular status: blood pressure returned to baseline and stable Postop Assessment: no apparent nausea or vomiting Anesthetic complications: no   No notable events documented.  Last Vitals:  Vitals:   10/06/21 1547 10/06/21 1722  BP: (!) 170/89 139/79  Pulse: 89 95  Resp: 18 20  Temp: 36.7 C 37.2 C  SpO2: 95% 92%    Last Pain:  Vitals:   10/06/21 1722  TempSrc:   PainSc: 0-No pain                 Aariv Medlock S

## 2021-10-07 ENCOUNTER — Inpatient Hospital Stay (HOSPITAL_COMMUNITY): Payer: Medicare HMO

## 2021-10-07 ENCOUNTER — Encounter (HOSPITAL_COMMUNITY): Payer: Self-pay | Admitting: Surgery

## 2021-10-07 DIAGNOSIS — N179 Acute kidney failure, unspecified: Secondary | ICD-10-CM | POA: Diagnosis not present

## 2021-10-07 DIAGNOSIS — E871 Hypo-osmolality and hyponatremia: Secondary | ICD-10-CM | POA: Diagnosis not present

## 2021-10-07 DIAGNOSIS — E119 Type 2 diabetes mellitus without complications: Secondary | ICD-10-CM | POA: Diagnosis not present

## 2021-10-07 DIAGNOSIS — L0390 Cellulitis, unspecified: Secondary | ICD-10-CM | POA: Diagnosis not present

## 2021-10-07 LAB — CBC WITH DIFFERENTIAL/PLATELET
Abs Immature Granulocytes: 0.06 10*3/uL (ref 0.00–0.07)
Basophils Absolute: 0 10*3/uL (ref 0.0–0.1)
Basophils Relative: 0 %
Eosinophils Absolute: 0.1 10*3/uL (ref 0.0–0.5)
Eosinophils Relative: 1 %
HCT: 34.1 % — ABNORMAL LOW (ref 39.0–52.0)
Hemoglobin: 11 g/dL — ABNORMAL LOW (ref 13.0–17.0)
Immature Granulocytes: 0 %
Lymphocytes Relative: 9 %
Lymphs Abs: 1.2 10*3/uL (ref 0.7–4.0)
MCH: 27.5 pg (ref 26.0–34.0)
MCHC: 32.3 g/dL (ref 30.0–36.0)
MCV: 85.3 fL (ref 80.0–100.0)
Monocytes Absolute: 1.1 10*3/uL — ABNORMAL HIGH (ref 0.1–1.0)
Monocytes Relative: 8 %
Neutro Abs: 11.1 10*3/uL — ABNORMAL HIGH (ref 1.7–7.7)
Neutrophils Relative %: 82 %
Platelets: 345 10*3/uL (ref 150–400)
RBC: 4 MIL/uL — ABNORMAL LOW (ref 4.22–5.81)
RDW: 12.7 % (ref 11.5–15.5)
WBC: 13.5 10*3/uL — ABNORMAL HIGH (ref 4.0–10.5)
nRBC: 0 % (ref 0.0–0.2)

## 2021-10-07 LAB — BASIC METABOLIC PANEL
Anion gap: 8 (ref 5–15)
BUN: 25 mg/dL — ABNORMAL HIGH (ref 6–20)
CO2: 25 mmol/L (ref 22–32)
Calcium: 8.4 mg/dL — ABNORMAL LOW (ref 8.9–10.3)
Chloride: 100 mmol/L (ref 98–111)
Creatinine, Ser: 1.18 mg/dL (ref 0.61–1.24)
GFR, Estimated: >60 mL/min (ref >60)
Glucose, Bld: 332 mg/dL — ABNORMAL HIGH (ref 70–99)
Potassium: 4.3 mmol/L (ref 3.5–5.1)
Sodium: 133 mmol/L — ABNORMAL LOW (ref 135–145)

## 2021-10-07 LAB — GLUCOSE, CAPILLARY
Glucose-Capillary: 168 mg/dL — ABNORMAL HIGH (ref 70–99)
Glucose-Capillary: 197 mg/dL — ABNORMAL HIGH (ref 70–99)
Glucose-Capillary: 198 mg/dL — ABNORMAL HIGH (ref 70–99)
Glucose-Capillary: 218 mg/dL — ABNORMAL HIGH (ref 70–99)
Glucose-Capillary: 227 mg/dL — ABNORMAL HIGH (ref 70–99)
Glucose-Capillary: 325 mg/dL — ABNORMAL HIGH (ref 70–99)

## 2021-10-07 LAB — TROPONIN I (HIGH SENSITIVITY): Troponin I (High Sensitivity): 13 ng/L (ref <18)

## 2021-10-07 MED ORDER — POLYETHYLENE GLYCOL 3350 17 G PO PACK
17.0000 g | PACK | Freq: Two times a day (BID) | ORAL | Status: DC
  Administered 2021-10-07 – 2021-10-12 (×11): 17 g via ORAL
  Filled 2021-10-07 (×21): qty 1

## 2021-10-07 MED ORDER — INSULIN ASPART PROT & ASPART (70-30 MIX) 100 UNIT/ML ~~LOC~~ SUSP
10.0000 [IU] | Freq: Two times a day (BID) | SUBCUTANEOUS | Status: DC
  Administered 2021-10-08 – 2021-10-09 (×3): 10 [IU] via SUBCUTANEOUS
  Filled 2021-10-07: qty 10

## 2021-10-07 MED ORDER — PIPERACILLIN-TAZOBACTAM 3.375 G IVPB
3.3750 g | INTRAVENOUS | Status: AC
Start: 2021-10-07 — End: 2021-10-07
  Administered 2021-10-07: 3.375 g via INTRAVENOUS
  Filled 2021-10-07: qty 50

## 2021-10-07 MED ORDER — INSULIN ASPART 100 UNIT/ML IJ SOLN: 0.0000 [IU] | Freq: Every day | INTRAMUSCULAR | Status: DC

## 2021-10-07 MED ORDER — INSULIN ASPART 100 UNIT/ML IJ SOLN
0.0000 [IU] | Freq: Three times a day (TID) | INTRAMUSCULAR | Status: DC
  Administered 2021-10-08 (×2): 4 [IU] via SUBCUTANEOUS
  Administered 2021-10-08: 7 [IU] via SUBCUTANEOUS
  Administered 2021-10-09: 3 [IU] via SUBCUTANEOUS
  Administered 2021-10-09: 4 [IU] via SUBCUTANEOUS
  Administered 2021-10-10: 7 [IU] via SUBCUTANEOUS
  Administered 2021-10-10: 4 [IU] via SUBCUTANEOUS
  Administered 2021-10-10: 3 [IU] via SUBCUTANEOUS
  Administered 2021-10-11: 4 [IU] via SUBCUTANEOUS
  Administered 2021-10-11: 7 [IU] via SUBCUTANEOUS
  Administered 2021-10-11 – 2021-10-12 (×2): 4 [IU] via SUBCUTANEOUS
  Administered 2021-10-12 (×2): 7 [IU] via SUBCUTANEOUS
  Administered 2021-10-13: 3 [IU] via SUBCUTANEOUS
  Administered 2021-10-13: 4 [IU] via SUBCUTANEOUS
  Administered 2021-10-13: 7 [IU] via SUBCUTANEOUS
  Administered 2021-10-14 – 2021-10-16 (×6): 4 [IU] via SUBCUTANEOUS
  Administered 2021-10-16 (×2): 3 [IU] via SUBCUTANEOUS
  Administered 2021-10-17 – 2021-10-18 (×4): 4 [IU] via SUBCUTANEOUS
  Administered 2021-10-18: 3 [IU] via SUBCUTANEOUS
  Administered 2021-10-19: 4 [IU] via SUBCUTANEOUS
  Administered 2021-10-19: 7 [IU] via SUBCUTANEOUS
  Administered 2021-10-20: 4 [IU] via SUBCUTANEOUS
  Administered 2021-10-20: 7 [IU] via SUBCUTANEOUS

## 2021-10-07 MED ORDER — INSULIN ASPART 100 UNIT/ML IJ SOLN
3.0000 [IU] | Freq: Three times a day (TID) | INTRAMUSCULAR | Status: DC
  Administered 2021-10-08 – 2021-10-09 (×4): 3 [IU] via SUBCUTANEOUS

## 2021-10-07 MED ORDER — INSULIN GLARGINE-YFGN 100 UNIT/ML ~~LOC~~ SOLN
10.0000 [IU] | Freq: Every day | SUBCUTANEOUS | Status: AC
  Administered 2021-10-07: 10 [IU] via SUBCUTANEOUS
  Filled 2021-10-07: qty 0.1

## 2021-10-07 MED ORDER — INSULIN ASPART 100 UNIT/ML IJ SOLN
0.0000 [IU] | Freq: Every day | INTRAMUSCULAR | Status: DC
  Administered 2021-10-07: 2 [IU] via SUBCUTANEOUS

## 2021-10-07 MED ORDER — INSULIN ASPART 100 UNIT/ML IJ SOLN: 0.0000 [IU] | Freq: Three times a day (TID) | INTRAMUSCULAR | Status: DC

## 2021-10-07 NOTE — Progress Notes (Signed)
PROGRESS NOTE    Brett White  [id number] DOB: 10/03/1960 DOA: 10/03/2021 PCP: System, Provider Not In    Chief Complaint  Patient presents with   Wound Infection   Abscess    Brief Narrative:  Brett White is a pleasant 61 y.o. 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  The patient reports noticing some pain and swelling near the left scapula approximately 2 days ago thought he may have been bit by a spider though he did not see any insect.  He has had some subjective fevers and chills associated with this.   Subjective:  Pain remain the same, also reports left shoulder pain with limited range of motion  N/v resolved, dizziness resolved, reports chronic blurry vision  No bm for 5 days   fever resolved  Assessment & Plan:   Principal Problem:   Cellulitis Active Problems:   Uncontrolled type 2 diabetes mellitus with hyperglycemia (HCC)   Back abscess   AKI (acute kidney injury) ([place])  Abscess/cellulitis/sepsis, present on admission With fever, tachycardia, leukocytosis -Status post I&D left shoulder abscess, I do not see wound culture sent, blood culture no growth, negative MRSA screening -He received dalbavancin in the ED -although labs has improved, wound  appears getting worse with increase pain and drainage,  general surgery consulted, he was brought to the OR on 1/6 s/p Excision and debridement of left upper back subcutaneous abscess and phlegmon -Surgical culture pending, he received Zosyn  Left shoulder pain  Will get shoulder x ray    Insulin-dependent type 2 diabetes, uncontrolled, with hyperglycemia A1c 12.2 Reports can not afford insulin, only takes metformin at home Start insulin here, continue to  adjust Diabetes RN input appreciated  Peripheral neuropathy Significantly decreased sensation right lower leg/foot  N/V ( present on admission), resolved Denies ab pain, kub + constipation, start stool softener   AKI From dehydration? 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 He is able to give history but not able tell me the right month or year  Depression continue Cymbalta  FTT, appear to have poor social supports, reports pcp is in the process getting him to a SNF,  Pt eval recommend snf placement, social worker aware    Body mass index is 26.96 kg/m.Marland Kitchen      Unresulted Labs (From admission, onward)     Start     Ordered   10/07/21 [id number]  Basic metabolic panel  Daily,   R     Question:  Specimen collection method  Answer:  Lab=Lab collect   10/06/21 1621              DVT prophylaxis: SCDs Start: 10/04/21 0105   Code Status: full Family Communication: patient Disposition:   Status is: Inpatient  Dispo: The patient is from: home, reports Relocated from [place] to [place], does not have good social support here  Anticipated d/c is to: SNF              Anticipated d/c date is: TBD                Consultants:  General surgery  Procedures:  I/D abscess in the ED Wound debridement in OR on 1/6  Antimicrobials:    Anti-infectives (From admission, onward)    Start     Dose/Rate Route Frequency Ordered Stop   10/07/21 1015  piperacillin-tazobactam (ZOSYN) IVPB 3.375 g        3.375 g 12.5 mL/hr over 240 Minutes  Intravenous On call to O.R. 10/07/21 0919 10/08/21 0559   10/06/21 1145  piperacillin-tazobactam (ZOSYN) IVPB 3.375 g        3.375 g 12.5 mL/hr over 240 Minutes Intravenous On call to O.R. 10/06/21 1046 10/06/21 1644   10/03/21 1745  dalbavancin (DALVANCE) 1,500 mg in dextrose 5 % 500 mL IVPB        1,500 mg 967.7 mL/hr over 31 Minutes Intravenous Once 10/03/21 1736 10/03/21 2234          Objective: Vitals:   10/07/21 0506 10/07/21 0800 10/07/21 0805 10/07/21 1156  BP: 118/80 131/75 123/74 111/83  Pulse: 65 76  81  Resp: 11  16 10   Temp: 98 F (36.7 C)  97.9 F (36.6 C) 98.1 F (36.7 C)  TempSrc: Oral  Oral Oral  SpO2: 93% 92%  90%  Weight:      Height:        Intake/Output Summary (Last 24 hours) at 10/07/2021 1421 Last data filed at 10/07/2021 1400 Gross per 24 hour  Intake 1025.83 ml  Output 1000 ml  Net 25.83 ml   Filed Weights   10/03/21 1317 10/06/21 1547  Weight: 95.3 kg 95.3 kg    Examination:  General exam: does not look comfortable, chronically ill appearing ,alert, awake, communicative Respiratory system: Clear to auscultation. Respiratory effort normal. Cardiovascular system:  RRR.  Gastrointestinal system: Abdomen is nondistended, soft and nontender.  Normal bowel sounds heard. Central nervous system: Alert and orientedx2, not able to tell me the right year /month.  Extremities:  s/p left BKA, right lower extremity PAD changes , left shoulder limited  range of motion  Skin: left upper back cellulitis/abscess , dressing in place, very tender to light touch Psychiatry: Judgement and insight appear normal. Mood & affect appropriate.     Data Reviewed: I have personally reviewed following labs and imaging studies  CBC: Recent Labs  Lab 10/03/21 1349 10/04/21 0218 10/05/21 0135 10/06/21 0158 10/06/21 1617 10/07/21 0027  WBC 16.6* 21.9* 16.5* 12.8*  --  13.5*  NEUTROABS 14.3*  --   --   --   --  11.1*  HGB 14.0 12.6* 12.2* 10.6* 12.6* 11.0*  HCT  42.0 38.0* 38.0* 32.9* 37.0* 34.1*  MCV 83.3 84.3 84.8 84.6  --  85.3  PLT 320 290 292 312  --  123456    Basic Metabolic Panel: Recent Labs  Lab 10/03/21 1349 10/04/21 0218 10/05/21 0135 10/06/21 0158 10/06/21 1617 10/07/21 0027  NA 128* 129* 130* 129* 134* 133*  K 4.1 3.7 3.6 3.8 4.1 4.3  CL 95* 94* 97* 96* 98 100  CO2 22 25 22 24   --  25  GLUCOSE 517* 321* 212* 168* 117* 332*  BUN 17 21* 34* 28* 22* 25*  CREATININE 1.39* 1.77* 1.65* 1.04 0.80 1.18  CALCIUM 8.8* 8.5* 8.5* 8.3*  --  8.4*  MG  --   --  2.0  --   --   --     GFR: Estimated Creatinine Clearance: 79.3 mL/min (by C-G formula based on SCr of 1.18 mg/dL).  Liver Function Tests: Recent Labs  Lab 10/03/21 1349  AST 14*  ALT 11  ALKPHOS 108  BILITOT 1.1  PROT 7.6  ALBUMIN 3.1*    CBG: Recent Labs  Lab 10/06/21 2104 10/07/21 0039 10/07/21 0509 10/07/21 0809 10/07/21 1157  GLUCAP 265* 325* 198* 197* 218*     Recent Results (from the past 240 hour(s))  Blood culture (routine single)     Status: None (Preliminary result)   Collection Time: 10/03/21  1:49 PM   Specimen: Site Not Specified; Blood  Result Value Ref Range Status   Specimen Description SITE NOT SPECIFIED  Final   Special Requests   Final    BOTTLES DRAWN AEROBIC AND ANAEROBIC Blood Culture adequate volume   Culture   Final    NO GROWTH 4 DAYS Performed at [facility], [place] [address] [place], [place] [address]    Report Status PENDING  Incomplete  Resp Panel by RT-PCR (Flu A&B, Covid) Nasopharyngeal Swab     Status: None   Collection Time: 10/04/21 12:41 AM   Specimen: Nasopharyngeal Swab; Nasopharyngeal(NP) swabs in vial transport medium  Result Value Ref Range Status   SARS Coronavirus 2 by RT PCR NEGATIVE NEGATIVE Final    Comment: (NOTE) SARS-CoV-2 target nucleic acids are NOT DETECTED.  The SARS-CoV-2 RNA is generally detectable in upper respiratory specimens during the acute phase of infection. The  lowest concentration of SARS-CoV-2 viral copies this assay can detect is 138 copies/mL. A negative result does not preclude SARS-Cov-2 infection and should not be used as the sole basis for treatment or other patient management decisions. A negative result may occur with  improper specimen collection/handling, submission of specimen other than nasopharyngeal swab, presence of viral mutation(s) within the areas targeted by this assay, and inadequate number of viral copies(<138 copies/mL). A negative result must be combined with clinical observations, patient history, and epidemiological information. The expected result is Negative.  Fact Sheet for Patients:  EntrepreneurPulse.com.au  Fact Sheet for Healthcare Providers:  IncredibleEmployment.be  This test is no t yet approved or cleared by the [place] FDA and  has been authorized for detection and/or diagnosis of SARS-CoV-2 by FDA under an Emergency Use Authorization (EUA). This EUA will remain  in effect (meaning this test can be used) for the duration of the COVID-19 declaration under Section 564(b)(1) of the Act, 21 U.S.C.section 360bbb-3(b)(1), unless the authorization is terminated  or revoked sooner.       Influenza A by PCR NEGATIVE NEGATIVE Final   Influenza B by PCR NEGATIVE NEGATIVE Final    Comment: (NOTE) The Xpert Xpress SARS-CoV-2/FLU/RSV plus assay is intended as an aid in the diagnosis of influenza from Nasopharyngeal swab specimens and should not be used as a sole basis for treatment. Nasal washings and aspirates are unacceptable for Xpert Xpress SARS-CoV-2/FLU/RSV testing.  Fact Sheet for Patients: EntrepreneurPulse.com.au  Fact Sheet for Healthcare Providers: IncredibleEmployment.be  This test is not yet approved or cleared by the [place] FDA and has been authorized for detection and/or diagnosis of SARS-CoV-2 by FDA under  an Emergency Use Authorization (EUA). This EUA will remain in effect (meaning this test can be used) for the duration of the COVID-19 declaration under Section 564(b)(1) of the Act, 21 U.S.C. section 360bbb-3(b)(1), unless the authorization is terminated or revoked.  Performed at [facility], [place] [address] [place], [place] [address]   MRSA Next Gen by PCR, Nasal     Status: None   Collection Time: 10/04/21 10:41 PM   Specimen: Nasal Mucosa; Nasal Swab  Result Value Ref Range Status   MRSA by PCR Next Gen NOT DETECTED NOT DETECTED Final    Comment: (NOTE) The GeneXpert MRSA Assay (FDA approved for NASAL specimens only), is one component of a comprehensive MRSA colonization surveillance program. It is not intended to diagnose MRSA infection nor to guide or monitor treatment for MRSA infections. Test performance is not FDA approved in patients less than 59 years old. Performed at [facility], [place] [address], [place], [place] [address]   Aerobic/Anaerobic Culture w Gram Stain (surgical/deep wound)     Status: None (Preliminary result)   Collection Time: 10/06/21  4:57 PM   Specimen: PATH Other; Tissue  Result Value Ref Range Status   Specimen Description ABSCESS  Final   Special Requests BACK ABCESS DRAINAGE SPEC A  Final   Gram Stain   Final    RARE WBC PRESENT, PREDOMINANTLY MONONUCLEAR RARE GRAM POSITIVE COCCI IN PAIRS    Culture   Final    TOO YOUNG TO READ Performed at [facility], [place] [address] [place], [place] [address]    Report Status PENDING  Incomplete  Aerobic/Anaerobic Culture w Gram Stain (surgical/deep wound)     Status: None (Preliminary result)   Collection Time: 10/06/21  4:59 PM   Specimen: PATH Other; Tissue  Result Value Ref Range Status   Specimen Description ABSCESS  Final   Special Requests BACK ABCESS TISSUE  Final   Gram Stain   Final    RARE WBC PRESENT,BOTH PMN AND MONONUCLEAR RARE GRAM POSITIVE COCCI    Culture   Final     TOO YOUNG TO READ Performed at [facility], [place] [address], [place], [place] [address]    Report Status PENDING  Incomplete         Radiology Studies: VAS [place] ABI WITH/WO TBI  Result Date: 10/06/2021  LOWER EXTREMITY DOPPLER STUDY Patient Name:  GAZA LOREDO  Date of Exam:   10/06/2021 Medical Rec #: [id number]       Accession #:    [id number] Date of Birth: [birth year]       Patient Gender: M Patient Age:   49 years Exam Location[facility]  [facility] Procedure:      VAS [place] ABI WITH/WO TBI Referring Phys: Annamaria Boots Maurisha Mongeau --------------------------------------------------------------------------------  Indications: PVD w/ intermittent claudication. High Risk Factors: Diabetes.  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 Comparison Study: prior Performing Technologist: Archie Patten RVS  Examination Guidelines: A complete evaluation includes at minimum, Doppler waveform signals and systolic blood pressure reading at the level of bilateral brachial, anterior tibial, and posterior tibial arteries, when vessel segments are accessible. Bilateral testing is considered an integral part of a complete examination. Photoelectric Plethysmograph (PPG) waveforms and toe systolic pressure readings are included as required and additional duplex testing as needed. Limited examinations for reoccurring indications may be performed as noted.  ABI Findings: +---------+------------------+-----+---------+--------+  Right     Rt Pressure (mmHg) Index Waveform  Comment   +---------+------------------+-----+---------+--------+  Brachial  150                      triphasic           +---------+------------------+-----+---------+--------+  PTA  196                1.31  triphasic           +---------+------------------+-----+---------+--------+  DP        224                1.49  triphasic           +---------+------------------+-----+---------+--------+  Great Toe 203                1.35                       +---------+------------------+-----+---------+--------+ +--------+------------------+-----+---------+-------+  Left     Lt Pressure (mmHg) Index Waveform  Comment  +--------+------------------+-----+---------+-------+  Brachial 147                      triphasic          +--------+------------------+-----+---------+-------+  PTA                                         bka      +--------+------------------+-----+---------+-------+  DP                                          bka      +--------+------------------+-----+---------+-------+ +-------+-----------+-----------+------------+------------+  ABI/TBI Today's ABI Today's TBI Previous ABI Previous TBI  +-------+-----------+-----------+------------+------------+  Right   1.49        1.35                                   +-------+-----------+-----------+------------+------------+  Left    bka                                                +-------+-----------+-----------+------------+------------+  Summary: Right: Resting right ankle-brachial index indicates noncompressible right lower extremity arteries. Left: Bka.  *See table(s) above for measurements and observations.  Electronically signed by Orlie Pollen on 10/06/2021 at 6:58:21 PM.    Final         Scheduled Meds:  acetaminophen  325 mg Oral Once   aspirin  81 mg Oral Daily   atorvastatin  10 mg Oral Daily   DULoxetine  30 mg Oral Daily   gabapentin  300 mg Oral TID   insulin aspart  0-15 Units Subcutaneous Q4H   insulin glargine-yfgn  20 Units Subcutaneous QHS   metoprolol tartrate  12.5 mg Oral BID   pantoprazole  40 mg Oral Daily   pneumococcal 23 valent vaccine  0.5 mL Intramuscular Tomorrow-1000   polyethylene glycol  17 g Oral BID   senna-docusate  1 tablet Oral BID   sodium chloride flush  10-40 mL Intracatheter Q12H   sodium chloride flush  3 mL Intravenous Q12H   Continuous Infusions:  piperacillin-tazobactam (ZOSYN)  IV 3.375 g (10/07/21 1155)   sodium chloride Stopped  (10/03/21 2150)     LOS: 4 days   Time spent: 67mins Greater than 50% of this time was spent in counseling, explanation of diagnosis, planning of further management, and coordination of  care.   Voice Recognition Viviann Spare dictation system was used to create this note, attempts have been made to correct errors. Please contact the author with questions and/or clarifications.   [name] Reasons, MD PhD FACP [facility]  Available via Epic secure chat 7am-7pm for nonurgent issues Please page for urgent issues To page the attending provider between 7A-7P or the covering provider during after hours 7P-7A, please log into the web site [email or web address] and access using universal [place] password for that web site. If you do not have the password, please call the hospital operator.    10/07/2021, 2:21 PM

## 2021-10-07 NOTE — Progress Notes (Signed)
Inpatient Diabetes Program Recommendations  AACE/ADA: New Consensus Statement on Inpatient Glycemic Control   Target Ranges:  Prepandial:   less than 140 mg/dL      Peak postprandial:   less than 180 mg/dL (1-2 hours)      Critically ill patients:  140 - 180 mg/dL     Latest Reference Range & Units 10/06/21 08:57 10/06/21 12:16 10/06/21 15:49 10/06/21 17:22 10/06/21 21:04 10/07/21 00:39 10/07/21 05:09 10/07/21 08:09 10/07/21 11:57  Glucose-Capillary 70 - 99 mg/dL 174 (H) 125 (H) 125 (H) 135 (H) 265 (H) 325 (H) 198 (H) 197 (H) 218 (H)   Review of Glycemic Control  Diabetes history: DM2 Outpatient Diabetes medications: Lantus 5 units BID (not taken in 1 month), Novolog 5 units TID (not taken in 1 month), Metformin 500 mg daily Current orders for Inpatient glycemic control: Semglee 20 units QHS, Novolog 0-15 units Q4H  Inpatient Diabetes Program Recommendations:    -   consider adding Novolog 3 units TID with meals for meal coverage if patient eats at least 50% of meals.   If provider prefers to order 70/30 while inpatient, recommend discontinuing Semglee and ordering 70/30 21 units BID (dose would provide 29.4 units for basal and 12.6 units for meal coverage per day).    Outpatient DM needs: Since patient is not able to afford Lantus and Novolog insulin, would recommend to change to Novolin 70/30 insulin (pens) since it is much more affordable ($43 per box of 5 insulin pens at [facility]).  At time of discharge, please provide Rx for Novolin 70/30 [id number]) and insulin pen needles [phone]).   Thanks, Tama Headings RN, MSN, BC-ADM Inpatient Diabetes Coordinator Team Pager [phone] (8a-5p)

## 2021-10-07 NOTE — Progress Notes (Signed)
1 Day Post-Op   Chief Complaint/Subjective: Terrible pains at I+D site overnight, burning and sharp pains, worse with pressure  Review of Systems See above, otherwise other systems negative   PMH -  has a past medical history of Diabetes mellitus without complication ([place]) and Stroke ([place]). PSH -  has a past surgical history that includes Appendectomy; Tonsillectomy; Back surgery; Amputation (Left, 02/04/2021); Amputation (Left, 02/10/2021); I & D extremity (Left, 03/03/2021); Amputation (Left, 03/22/2021); removal of back cyst; and Incision and drainage abscess (N/A, 10/06/2021).  [facility] - family history is not on file.   Objective: Vital signs in last 24 hours: Temp:  [97.8 F (36.6 C)-98.9 F (37.2 C)] 97.9 F (36.6 C) (01/07 0805) Pulse Rate:  [65-95] 76 (01/07 0800) Resp:  [11-20] 16 (01/07 0805) BP: (118-170)/(74-91) 123/74 (01/07 0805) SpO2:  [91 %-96 %] 92 % (01/07 0800) Weight:  [95.3 kg] 95.3 kg (01/06 1547) Last BM Date:  (patient unable to recall last BM due to memory impairment) Intake/Output from previous day: 01/06 0701 - 01/07 0700 In: 1000 [I.V.:1000] Out: 400 [Urine:400] Intake/Output this shift: No intake/output data recorded.  PE: Gen: uncomfortable Resp: nonlabored Card: RRR Back: open would without purulent drainage, some erythema around the area and darkened skin adjacent to wound, no bleeding  Lab Results:  Recent Labs    10/06/21 0158 10/06/21 1617 10/07/21 0027  WBC 12.8*  --  13.5*  HGB 10.6* 12.6* 11.0*  HCT 32.9* 37.0* 34.1*  PLT 312  --  345   BMET Recent Labs    10/06/21 0158 10/06/21 1617 10/07/21 0027  NA 129* 134* 133*  K 3.8 4.1 4.3  CL 96* 98 100  CO2 24  --  25  GLUCOSE 168* 117* 332*  BUN 28* 22* 25*  CREATININE 1.04 0.80 1.18  CALCIUM 8.3*  --  8.4*   PT/INR No results for input(s): LABPROT, INR in the last 72 hours. CMP     Component Value Date/Time   NA 133 (L) 10/07/2021 0027   K 4.3 10/07/2021 0027   CL 100  10/07/2021 0027   CO2 25 10/07/2021 0027   GLUCOSE 332 (H) 10/07/2021 0027   BUN 25 (H) 10/07/2021 0027   CREATININE 1.18 10/07/2021 0027   CALCIUM 8.4 (L) 10/07/2021 0027   PROT 7.6 10/03/2021 1349   ALBUMIN 3.1 (L) 10/03/2021 1349   AST 14 (L) 10/03/2021 1349   ALT 11 10/03/2021 1349   ALKPHOS 108 10/03/2021 1349   BILITOT 1.1 10/03/2021 1349   GFRNONAA >60 10/07/2021 0027   Lipase     Component Value Date/Time   LIPASE 22 02/21/2021 2134    Studies/Results: VAS [place] ABI WITH/WO TBI  Result Date: 10/06/2021  LOWER EXTREMITY DOPPLER STUDY Patient Name:  Brett White  Date of Exam:   10/06/2021 Medical Rec #: [id number]       Accession #:    [id number] Date of Birth: 1961-08-19       Patient Gender: M Patient Age:   61 years Exam Location:  [facility] Procedure:      VAS [place] ABI WITH/WO TBI Referring Phys: Annamaria Boots XU --------------------------------------------------------------------------------  Indications: PVD w/ intermittent claudication. High Risk Factors: Diabetes.  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 Comparison Study: prior Performing Technologist: Archie Patten RVS  Examination Guidelines: A complete evaluation includes at minimum, Doppler waveform signals and systolic blood pressure reading at the level of bilateral brachial, anterior tibial, and posterior tibial arteries, when vessel segments are accessible. Bilateral testing  is considered an integral part of a complete examination. Photoelectric Plethysmograph (PPG) waveforms and toe systolic pressure readings are included as required and additional duplex testing as needed. Limited examinations for reoccurring indications may be performed as noted.  ABI Findings: +---------+------------------+-----+---------+--------+  Right     Rt Pressure (mmHg) Index Waveform  Comment   +---------+------------------+-----+---------+--------+  Brachial  150                      triphasic            +---------+------------------+-----+---------+--------+  PTA       196                1.31  triphasic           +---------+------------------+-----+---------+--------+  DP        224                1.49  triphasic           +---------+------------------+-----+---------+--------+  Great Toe 203                1.35                      +---------+------------------+-----+---------+--------+ +--------+------------------+-----+---------+-------+  Left     Lt Pressure (mmHg) Index Waveform  Comment  +--------+------------------+-----+---------+-------+  Brachial 147                      triphasic          +--------+------------------+-----+---------+-------+  PTA                                         bka      +--------+------------------+-----+---------+-------+  DP                                          bka      +--------+------------------+-----+---------+-------+ +-------+-----------+-----------+------------+------------+  ABI/TBI Today's ABI Today's TBI Previous ABI Previous TBI  +-------+-----------+-----------+------------+------------+  Right   1.49        1.35                                   +-------+-----------+-----------+------------+------------+  Left    bka                                                +-------+-----------+-----------+------------+------------+  Summary: Right: Resting right ankle-brachial index indicates noncompressible right lower extremity arteries. Left: Bka.  *See table(s) above for measurements and observations.  Electronically signed by Orlie Pollen on 10/06/2021 at 6:58:21 PM.    Final     Anti-infectives: Anti-infectives (From admission, onward)    Start     Dose/Rate Route Frequency Ordered Stop   10/07/21 1015  piperacillin-tazobactam (ZOSYN) IVPB 3.375 g        3.375 g 12.5 mL/hr over 240 Minutes Intravenous On call to O.R. 10/07/21 0919 10/08/21 0559   10/06/21 1145  piperacillin-tazobactam (ZOSYN) IVPB 3.375 g        3.375 g 12.5 mL/hr over 240 Minutes  Intravenous On  call to O.R. 10/06/21 1046 10/06/21 1644   10/03/21 1745  dalbavancin (DALVANCE) 1,500 mg in dextrose 5 % 500 mL IVPB        1,500 mg 967.7 mL/hr over 31 Minutes Intravenous Once 10/03/21 1736 10/03/21 2234       Assessment/Plan  s/p Procedure(s): INCISION AND DRAINAGE BACK ABSCESS 10/06/2021  -continue antibiotics  FEN - carb mod diet VTE - aspirin, surgery is ok with additional chemical prophylaxis ID - zosyn 1/6 Disposition - inpatient for pain and diabetes control   LOS: 4 days   I reviewed last 24 h vitals and pain scores, last 48 h intake and output, last 24 h labs and trends, and last 24 h imaging results.  This care required moderate level of medical decision making.   [facility] 10/07/2021, 10:12 AM Please see Amion for pager number during day hours 7:00am-4:30pm or 7:00am -11:30am on weekends

## 2021-10-08 DIAGNOSIS — E871 Hypo-osmolality and hyponatremia: Secondary | ICD-10-CM | POA: Diagnosis not present

## 2021-10-08 DIAGNOSIS — E119 Type 2 diabetes mellitus without complications: Secondary | ICD-10-CM | POA: Diagnosis not present

## 2021-10-08 DIAGNOSIS — L0390 Cellulitis, unspecified: Secondary | ICD-10-CM | POA: Diagnosis not present

## 2021-10-08 DIAGNOSIS — N179 Acute kidney failure, unspecified: Secondary | ICD-10-CM | POA: Diagnosis not present

## 2021-10-08 LAB — BASIC METABOLIC PANEL
Anion gap: 9 (ref 5–15)
BUN: 32 mg/dL — ABNORMAL HIGH (ref 6–20)
CO2: 29 mmol/L (ref 22–32)
Calcium: 8.6 mg/dL — ABNORMAL LOW (ref 8.9–10.3)
Chloride: 97 mmol/L — ABNORMAL LOW (ref 98–111)
Creatinine, Ser: 1.68 mg/dL — ABNORMAL HIGH (ref 0.61–1.24)
GFR, Estimated: 47 mL/min — ABNORMAL LOW (ref >60)
Glucose, Bld: 253 mg/dL — ABNORMAL HIGH (ref 70–99)
Potassium: 4.8 mmol/L (ref 3.5–5.1)
Sodium: 135 mmol/L (ref 135–145)

## 2021-10-08 LAB — CBC
HCT: 34.4 % — ABNORMAL LOW (ref 39.0–52.0)
Hemoglobin: 10.6 g/dL — ABNORMAL LOW (ref 13.0–17.0)
MCH: 27.1 pg (ref 26.0–34.0)
MCHC: 30.8 g/dL (ref 30.0–36.0)
MCV: 88 fL (ref 80.0–100.0)
Platelets: 408 10*3/uL — ABNORMAL HIGH (ref 150–400)
RBC: 3.91 MIL/uL — ABNORMAL LOW (ref 4.22–5.81)
RDW: 13.2 % (ref 11.5–15.5)
WBC: 7.4 10*3/uL (ref 4.0–10.5)
nRBC: 0 % (ref 0.0–0.2)

## 2021-10-08 LAB — CULTURE, BLOOD (SINGLE)
Culture: NO GROWTH
Special Requests: ADEQUATE

## 2021-10-08 LAB — SEDIMENTATION RATE: Sed Rate: 90 mm/hr — ABNORMAL HIGH (ref 0–16)

## 2021-10-08 LAB — GLUCOSE, CAPILLARY
Glucose-Capillary: 216 mg/dL — ABNORMAL HIGH (ref 70–99)
Glucose-Capillary: 164 mg/dL — ABNORMAL HIGH (ref 70–99)
Glucose-Capillary: 151 mg/dL — ABNORMAL HIGH (ref 70–99)
Glucose-Capillary: 171 mg/dL — ABNORMAL HIGH (ref 70–99)

## 2021-10-08 LAB — C-REACTIVE PROTEIN: CRP: 13.5 mg/dL — ABNORMAL HIGH (ref <1.0)

## 2021-10-08 MED ORDER — SODIUM CHLORIDE 0.9 % IV SOLN: INTRAVENOUS | Status: DC

## 2021-10-08 MED ORDER — PIPERACILLIN-TAZOBACTAM 3.375 G IVPB
3.3750 g | Freq: Three times a day (TID) | INTRAVENOUS | Status: AC
  Administered 2021-10-08: 3.375 g via INTRAVENOUS
  Filled 2021-10-08: qty 50

## 2021-10-08 NOTE — Progress Notes (Signed)
2 Days Post-Op   Chief Complaint/Subjective: Persistent burning pain at wound  Review of Systems See above, otherwise other systems negative   PMH -  has a past medical history of Diabetes mellitus without complication ([place]) and Stroke ([place]). PSH -  has a past surgical history that includes Appendectomy; Tonsillectomy; Back surgery; Amputation (Left, 02/04/2021); Amputation (Left, 02/10/2021); I & D extremity (Left, 03/03/2021); Amputation (Left, 03/22/2021); removal of back cyst; and Incision and drainage abscess (N/A, 10/06/2021).  [facility] - family history is not on file.   Objective: Vital signs in last 24 hours: Temp:  [97.6 F (36.4 C)-98.1 F (36.7 C)] 98.1 F (36.7 C) (01/08 0754) Pulse Rate:  [78-89] 80 (01/08 0335) Resp:  [10-22] 16 (01/08 0335) BP: (107-138)/(68-83) 138/79 (01/08 0754) SpO2:  [90 %-95 %] 95 % (01/08 0754) Last BM Date: 10/05/21 Intake/Output from previous day: 01/07 0701 - 01/08 0700 In: 25.8 [IV Piggyback:25.8] Out: 600 [Urine:600] Intake/Output this shift: No intake/output data recorded.  PE: Gen: NAd Resp: nonlabored Card: RRR Back: open wound with some fibrinous debris, no bleeding, surrounding skin less edematous and erythema  Lab Results:  Recent Labs    10/07/21 0027 10/08/21 0108  WBC 13.5* 7.4  HGB 11.0* 10.6*  HCT 34.1* 34.4*  PLT 345 408*   BMET Recent Labs    10/07/21 0027 10/08/21 0108  NA 133* 135  K 4.3 4.8  CL 100 97*  CO2 25 29  GLUCOSE 332* 253*  BUN 25* 32*  CREATININE 1.18 1.68*  CALCIUM 8.4* 8.6*   PT/INR No results for input(s): LABPROT, INR in the last 72 hours. CMP     Component Value Date/Time   NA 135 10/08/2021 0108   K 4.8 10/08/2021 0108   CL 97 (L) 10/08/2021 0108   CO2 29 10/08/2021 0108   GLUCOSE 253 (H) 10/08/2021 0108   BUN 32 (H) 10/08/2021 0108   CREATININE 1.68 (H) 10/08/2021 0108   CALCIUM 8.6 (L) 10/08/2021 0108   PROT 7.6 10/03/2021 1349   ALBUMIN 3.1 (L) 10/03/2021 1349   AST 14  (L) 10/03/2021 1349   ALT 11 10/03/2021 1349   ALKPHOS 108 10/03/2021 1349   BILITOT 1.1 10/03/2021 1349   GFRNONAA 47 (L) 10/08/2021 0108   Lipase     Component Value Date/Time   LIPASE 22 02/21/2021 2134    Studies/Results: DG Shoulder Left  Result Date: 10/07/2021 CLINICAL DATA:  Left shoulder pain. EXAM: LEFT SHOULDER - 2+ VIEW COMPARISON:  None. FINDINGS: Tiny bone fragment along the inferior bony glenoid, likely chronic. An acute fracture is less likely. Clinical correlation is recommended. No other acute fracture. There is no dislocation. No significant arthritic changes. Mild degenerative changes of the left AC joint with spurring. The soft tissues are unremarkable. IMPRESSION: Tiny bone fragment along the inferior bony glenoid, likely chronic. An acute fracture is less likely. Electronically Signed   By: Anner Crete M.D.   On: 10/07/2021 20:13   VAS [place] ABI WITH/WO TBI  Result Date: 10/06/2021  LOWER EXTREMITY DOPPLER STUDY Patient Name:  Brett White  Date of Exam:   10/06/2021 Medical Rec #: [id number]       Accession #:    [id number] Date of Birth: 1960-10-08       Patient Gender: M Patient Age:   3 years Exam Location:  [facility] Procedure:      VAS [place] ABI WITH/WO TBI Referring Phys: Annamaria Boots XU --------------------------------------------------------------------------------  Indications: PVD w/ intermittent claudication. High Risk Factors:  Diabetes.  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 Comparison Study: prior Performing Technologist: Archie Patten RVS  Examination Guidelines: A complete evaluation includes at minimum, Doppler waveform signals and systolic blood pressure reading at the level of bilateral brachial, anterior tibial, and posterior tibial arteries, when vessel segments are accessible. Bilateral testing is considered an integral part of a complete examination. Photoelectric Plethysmograph (PPG) waveforms and toe systolic pressure readings are included as  required and additional duplex testing as needed. Limited examinations for reoccurring indications may be performed as noted.  ABI Findings: +---------+------------------+-----+---------+--------+  Right     Rt Pressure (mmHg) Index Waveform  Comment   +---------+------------------+-----+---------+--------+  Brachial  150                      triphasic           +---------+------------------+-----+---------+--------+  PTA       196                1.31  triphasic           +---------+------------------+-----+---------+--------+  DP        224                1.49  triphasic           +---------+------------------+-----+---------+--------+  Great Toe 203                1.35                      +---------+------------------+-----+---------+--------+ +--------+------------------+-----+---------+-------+  Left     Lt Pressure (mmHg) Index Waveform  Comment  +--------+------------------+-----+---------+-------+  Brachial 147                      triphasic          +--------+------------------+-----+---------+-------+  PTA                                         bka      +--------+------------------+-----+---------+-------+  DP                                          bka      +--------+------------------+-----+---------+-------+ +-------+-----------+-----------+------------+------------+  ABI/TBI Today's ABI Today's TBI Previous ABI Previous TBI  +-------+-----------+-----------+------------+------------+  Right   1.49        1.35                                   +-------+-----------+-----------+------------+------------+  Left    bka                                                +-------+-----------+-----------+------------+------------+  Summary: Right: Resting right ankle-brachial index indicates noncompressible right lower extremity arteries. Left: Bka.  *See table(s) above for measurements and observations.  Electronically signed by Orlie Pollen on 10/06/2021 at 6:58:21 PM.    Final      Anti-infectives: Anti-infectives (From admission, onward)    Start     Dose/Rate Route Frequency Ordered Stop   10/07/21 1015  piperacillin-tazobactam (ZOSYN)  IVPB 3.375 g        3.375 g 12.5 mL/hr over 240 Minutes Intravenous On call to O.R. 10/07/21 0919 10/07/21 1555   10/06/21 1145  piperacillin-tazobactam (ZOSYN) IVPB 3.375 g        3.375 g 12.5 mL/hr over 240 Minutes Intravenous On call to O.R. 10/06/21 1046 10/06/21 1644   10/03/21 1745  dalbavancin (DALVANCE) 1,500 mg in dextrose 5 % 500 mL IVPB        1,500 mg 967.7 mL/hr over 31 Minutes Intravenous Once 10/03/21 1736 10/03/21 2234       Assessment/Plan  s/p Procedure(s): INCISION AND DRAINAGE BACK ABSCESS 10/06/2021   FEN - carb mod diet VTE - asa 81 mg ID - zosyn 1/7 --> Disposition - inpatient for diabetes control and IV antibiotics   LOS: 5 days   I reviewed last 24 h vitals and pain scores, last 48 h intake and output, last 24 h labs and trends, and last 24 h imaging results.  This care required moderate level of medical decision making.   [facility] 10/08/2021, 9:16 AM Please see Amion for pager number during day hours 7:00am-4:30pm or 7:00am -11:30am on weekends

## 2021-10-08 NOTE — Progress Notes (Signed)
PROGRESS NOTE    Brett White  ZOX:[id number] DOB: Jan 01, 1961 DOA: 10/03/2021 PCP: Loura BackNguyen, Kim, NP    Chief Complaint  Patient presents with   Wound Infection   Abscess    Brief Narrative:  Brett White is a pleasant 61 y.o. 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  The patient reports noticing some pain and swelling near the left scapula approximately 2 days ago thought he may have been bit by a spider though he did not see any insect.  He has had some subjective fevers and chills associated with this.   Subjective:  "Pain is not getting any better" also reports left shoulder pain with limited range of motion  fever resolved  Assessment & Plan:   Principal Problem:   Cellulitis Active Problems:   Uncontrolled type 2 diabetes mellitus with hyperglycemia (HCC)   Back abscess   AKI (acute kidney injury) (HCC)  Abscess/cellulitis/sepsis, present on admission With fever, tachycardia, leukocytosis -blood culture no growth, negative MRSA screening -Status post I&D left shoulder abscess in the ED  -He received dalbavancin in the ED -although labs has improved, wound  appears getting worse with increase pain and drainage,  on 1/6 s/p Excision and debridement of left upper back subcutaneous abscess and phlegmon in the OR -Surgical culture + staph, final result pending, he received Zosyn in the OR -case discussed with ID who states dalbavancin last for total of 7 days, do not need any other abx for now, can follow culture result, ID recommend imaging, I discussed with radiology who recommend MRI shoulder including left upper back w/wo contrast -order placed   Insulin-dependent type 2 diabetes, uncontrolled, with hyperglycemia A1c  12.2 Reports can not afford insulin, only takes metformin at home Started insulin 70/30 ( for affordability, he can get this from [employer]) bid and ssi here,  Diabetes RN input appreciated  Peripheral neuropathy Significantly decreased sensation right lower leg/foot   AKI From dehydration? 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      Unresulted Labs (From admission, onward)     Start     Ordered   10/08/21 0500  CBC  Daily,   R     Question:  Specimen collection method  Answer:  Lab=Lab collect   10/07/21 1909   10/07/21 0500  Basic metabolic panel  Daily,   R     Question:  Specimen collection method  Answer:  Lab=Lab collect  10/06/21 1621              DVT prophylaxis: SCDs Start: 10/04/21 0105   Code Status: full Family Communication: patient Disposition:   Status is: Inpatient  Dispo: The patient is from: home, reports Relocated from [place] to [employer], does not have good social support here              Anticipated d/c is to: SNF              Anticipated d/c date is: TBD                 Consultants:  General surgery Case discussed with ID and radiology over the phone on 1/8  Procedures:  I/D abscess in the ED Wound debridement in OR on 1/6  Antimicrobials:    Anti-infectives (From admission, onward)    Start     Dose/Rate Route Frequency Ordered Stop   10/07/21 1015  piperacillin-tazobactam (ZOSYN) IVPB 3.375 g        3.375 g 12.5 mL/hr over 240 Minutes Intravenous On call to O.R. 10/07/21 0919 10/07/21 1555   10/06/21 1145  piperacillin-tazobactam (ZOSYN) IVPB 3.375 g        3.375 g 12.5 mL/hr over 240 Minutes Intravenous On call to O.R. 10/06/21 1046 10/06/21 1644   10/03/21 1745  dalbavancin (DALVANCE) 1,500 mg in dextrose 5 % 500 mL IVPB        1,500 mg 967.7 mL/hr over 31 Minutes Intravenous Once 10/03/21 1736 10/03/21 2234          Objective: Vitals:   10/07/21 2005 10/07/21 2326 10/08/21 0335 10/08/21 0754  BP: 130/81 107/68 115/73 138/79  Pulse: 89 78 80   Resp: 15 14 16    Temp: 97.7 F (36.5 C) 98 F (36.7 C) 97.9 F (36.6 C) 98.1 F (36.7 C)  TempSrc: Oral Oral Oral Oral  SpO2: 91% 90% 95% 95%  Weight:      Height:        Intake/Output Summary (Last 24 hours) at 10/08/2021 0825 Last data filed at 10/07/2021 1400 Gross per 24 hour  Intake 25.83 ml  Output 600 ml  Net -574.17 ml   Filed Weights   10/03/21 1317 10/06/21 1547  Weight: 95.3 kg 95.3 kg    Examination:  General exam: does not look comfortable, chronically ill appearing ,alert, awake, communicative Respiratory system: Clear to auscultation. Respiratory effort normal. Cardiovascular system:  RRR.  Gastrointestinal system: Abdomen is nondistended, soft and nontender.  Normal bowel sounds heard. Central nervous system: Alert and orientedx2, not able to tell me the right year /month.  Extremities:  s/p left BKA, right lower extremity PAD changes , left shoulder limited  range of motion  Skin: left upper back cellulitis/abscess , dressing in place, very tender  to light touch Psychiatry: Judgement and insight appear normal. Mood & affect appropriate.     Data Reviewed: I have personally reviewed following labs and imaging studies  CBC: Recent Labs  Lab 10/03/21 1349 10/04/21 0218 10/05/21 0135 10/06/21 0158 10/06/21 1617 10/07/21 0027 10/08/21 0108  WBC 16.6* 21.9* 16.5* 12.8*  --  13.5* 7.4  NEUTROABS 14.3*  --   --   --   --  11.1*  --   HGB 14.0 12.6* 12.2* 10.6* 12.6* 11.0* 10.6*  HCT 42.0 38.0* 38.0* 32.9* 37.0* 34.1* 34.4*  MCV 83.3 84.3 84.8 84.6  --  85.3 88.0  PLT 320 290 292 312  --  345 408*  Basic Metabolic Panel: Recent Labs  Lab 10/04/21 0218 10/05/21 0135 10/06/21 0158 10/06/21 1617 10/07/21 0027 10/08/21 0108  NA 129* 130* 129* 134* 133* 135  K 3.7 3.6 3.8 4.1 4.3 4.8  CL 94* 97* 96* 98 100 97*  CO2 25 22 24   --  25 29  GLUCOSE 321* 212* 168* 117* 332* 253*  BUN 21* 34* 28* 22* 25* 32*  CREATININE 1.77* 1.65* 1.04 0.80 1.18 1.68*  CALCIUM 8.5* 8.5* 8.3*  --  8.4* 8.6*  MG  --  2.0  --   --   --   --     GFR: Estimated Creatinine Clearance: 55.7 mL/min (A) (by C-G formula based on SCr of 1.68 mg/dL (H)).  Liver Function Tests: Recent Labs  Lab 10/03/21 1349  AST 14*  ALT 11  ALKPHOS 108  BILITOT 1.1  PROT 7.6  ALBUMIN 3.1*    CBG: Recent Labs  Lab 10/07/21 0809 10/07/21 1157 10/07/21 1708 10/07/21 2111 10/08/21 0757  GLUCAP 197* 218* 168* 227* 216*     Recent Results (from the past 240 hour(s))  Blood culture (routine single)     Status: None (Preliminary result)   Collection Time: 10/03/21  1:49 PM   Specimen: Site Not Specified; Blood  Result Value Ref Range Status   Specimen Description SITE NOT SPECIFIED  Final   Special Requests   Final    BOTTLES DRAWN AEROBIC AND ANAEROBIC Blood Culture adequate volume   Culture   Final    NO GROWTH 4 DAYS Performed at [facility], [address], [place], [place]    Report Status PENDING  Incomplete  Resp Panel by  RT-PCR (Flu A&B, Covid) Nasopharyngeal Swab     Status: None   Collection Time: 10/04/21 12:41 AM   Specimen: Nasopharyngeal Swab; Nasopharyngeal(NP) swabs in vial transport medium  Result Value Ref Range Status   SARS Coronavirus 2 by RT PCR NEGATIVE NEGATIVE Final    Comment: (NOTE) SARS-CoV-2 target nucleic acids are NOT DETECTED.  The SARS-CoV-2 RNA is generally detectable in upper respiratory specimens during the acute phase of infection. The lowest concentration of SARS-CoV-2 viral copies this assay can detect is 138 copies/mL. A negative result does not preclude SARS-Cov-2 infection and should not be used as the sole basis for treatment or other patient management decisions. A negative result may occur with  improper specimen collection/handling, submission of specimen other than nasopharyngeal swab, presence of viral mutation(s) within the areas targeted by this assay, and inadequate number of viral copies(<138 copies/mL). A negative result must be combined with clinical observations, patient history, and epidemiological information. The expected result is Negative.  Fact Sheet for Patients:  12/02/21  Fact Sheet for Healthcare Providers:  BloggerCourse.com  This test is no t yet approved or cleared by the [place] FDA and  has been authorized for detection and/or diagnosis of SARS-CoV-2 by FDA under an Emergency Use Authorization (EUA). This EUA will remain  in effect (meaning this test can be used) for the duration of the COVID-19 declaration under Section 564(b)(1) of the Act, 21 U.S.C.section 360bbb-3(b)(1), unless the authorization is terminated  or revoked sooner.       Influenza A by PCR NEGATIVE NEGATIVE Final   Influenza B by PCR NEGATIVE NEGATIVE Final    Comment: (NOTE) The Xpert Xpress SARS-CoV-2/FLU/RSV plus assay is intended as an aid in the diagnosis of influenza from Nasopharyngeal swab  specimens and should not be used as a  sole basis for treatment. Nasal washings and aspirates are unacceptable for Xpert Xpress SARS-CoV-2/FLU/RSV testing.  Fact Sheet for Patients: BloggerCourse.com  Fact Sheet for Healthcare Providers: SeriousBroker.it  This test is not yet approved or cleared by the [place] FDA and has been authorized for detection and/or diagnosis of SARS-CoV-2 by FDA under an Emergency Use Authorization (EUA). This EUA will remain in effect (meaning this test can be used) for the duration of the COVID-19 declaration under Section 564(b)(1) of the Act, 21 U.S.C. section 360bbb-3(b)(1), unless the authorization is terminated or revoked.  Performed at [facility], [address] [place], [place] [address]   MRSA Next Gen by PCR, Nasal     Status: None   Collection Time: 10/04/21 10:41 PM   Specimen: Nasal Mucosa; Nasal Swab  Result Value Ref Range Status   MRSA by PCR Next Gen NOT DETECTED NOT DETECTED Final    Comment: (NOTE) The GeneXpert MRSA Assay (FDA approved for NASAL specimens only), is one component of a comprehensive MRSA colonization surveillance program. It is not intended to diagnose MRSA infection nor to guide or monitor treatment for MRSA infections. Test performance is not FDA approved in patients less than 11 years old. Performed at [facility], [address], [place], [place] [address]   Aerobic/Anaerobic Culture w Gram Stain (surgical/deep wound)     Status: None (Preliminary result)   Collection Time: 10/06/21  4:57 PM   Specimen: PATH Other; Tissue  Result Value Ref Range Status   Specimen Description ABSCESS  Final   Special Requests BACK ABCESS DRAINAGE SPEC A  Final   Gram Stain   Final    RARE WBC PRESENT, PREDOMINANTLY MONONUCLEAR RARE GRAM POSITIVE COCCI IN PAIRS    Culture   Final    TOO YOUNG TO READ Performed at [facility], [address], [place], [place] [address]    Report Status PENDING  Incomplete  Aerobic/Anaerobic Culture w Gram Stain (surgical/deep wound)     Status: None (Preliminary result)   Collection Time: 10/06/21  4:59 PM   Specimen: PATH Other; Tissue  Result Value Ref Range Status   Specimen Description ABSCESS  Final   Special Requests BACK ABCESS TISSUE  Final   Gram Stain   Final    RARE WBC PRESENT,BOTH PMN AND MONONUCLEAR RARE GRAM POSITIVE COCCI    Culture   Final    TOO YOUNG TO READ Performed at [facility], [address] [place], [place] [address]    Report Status PENDING  Incomplete         Radiology Studies: DG Shoulder Left  Result Date: 10/07/2021 CLINICAL DATA:  Left shoulder pain. EXAM: LEFT SHOULDER - 2+ VIEW COMPARISON:  None. FINDINGS: Tiny bone fragment along the inferior bony glenoid, likely chronic. An acute fracture is less likely. Clinical correlation is recommended. No other acute fracture. There is no dislocation. No significant arthritic changes. Mild degenerative changes of the left AC joint with spurring. The soft tissues are unremarkable. IMPRESSION: Tiny bone fragment along the inferior bony glenoid, likely chronic. An acute fracture is less likely. Electronically Signed   By: Elgie Collard M.D.   On: 10/07/2021 20:13   VAS [place] ABI WITH/WO TBI  Result Date: 10/06/2021  LOWER EXTREMITY DOPPLER STUDY Patient Name:  FITZPATRICK ALBERICO  Date of Exam:   10/06/2021 Medical Rec #: [id number]       Accession #:    [id number] Date of  Birth: 21-Aug-1961       Patient Gender: M Patient Age:   8 years Exam Location:  [facility] Procedure:      VAS [place] ABI WITH/WO TBI Referring Phys: Parke Poisson Rodrigus Kilker --------------------------------------------------------------------------------  Indications: PVD w/ intermittent claudication. High Risk Factors: Diabetes.  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 Comparison Study: prior Performing Technologist: [place] Ponder RVS  Examination  Guidelines: A complete evaluation includes at minimum, Doppler waveform signals and systolic blood pressure reading at the level of bilateral brachial, anterior tibial, and posterior tibial arteries, when vessel segments are accessible. Bilateral testing is considered an integral part of a complete examination. Photoelectric Plethysmograph (PPG) waveforms and toe systolic pressure readings are included as required and additional duplex testing as needed. Limited examinations for reoccurring indications may be performed as noted.  ABI Findings: +---------+------------------+-----+---------+--------+  Right     Rt Pressure (mmHg) Index Waveform  Comment   +---------+------------------+-----+---------+--------+  Brachial  150                      triphasic           +---------+------------------+-----+---------+--------+  PTA       196                1.31  triphasic           +---------+------------------+-----+---------+--------+  DP        224                1.49  triphasic           +---------+------------------+-----+---------+--------+  Great Toe 203                1.35                      +---------+------------------+-----+---------+--------+ +--------+------------------+-----+---------+-------+  Left     Lt Pressure (mmHg) Index Waveform  Comment  +--------+------------------+-----+---------+-------+  Brachial 147                      triphasic          +--------+------------------+-----+---------+-------+  PTA                                         bka      +--------+------------------+-----+---------+-------+  DP                                          bka      +--------+------------------+-----+---------+-------+ +-------+-----------+-----------+------------+------------+  ABI/TBI Today's ABI Today's TBI Previous ABI Previous TBI  +-------+-----------+-----------+------------+------------+  Right   1.49        1.35                                    +-------+-----------+-----------+------------+------------+  Left    bka                                                +-------+-----------+-----------+------------+------------+  Summary: Right: Resting right ankle-brachial index indicates noncompressible right lower extremity arteries. Left: Bka.  *See table(s) above  for measurements and observations.  Electronically signed by Gerarda FractionJoshua Robins on 10/06/2021 at 6:58:21 PM.    Final         Scheduled Meds:  acetaminophen  325 mg Oral Once   aspirin  81 mg Oral Daily   atorvastatin  10 mg Oral Daily   DULoxetine  30 mg Oral Daily   gabapentin  300 mg Oral TID   insulin aspart  0-20 Units Subcutaneous TID WC   insulin aspart  0-5 Units Subcutaneous QHS   insulin aspart  3 Units Subcutaneous TID WC   insulin aspart protamine- aspart  10 Units Subcutaneous BID WC   metoprolol tartrate  12.5 mg Oral BID   pantoprazole  40 mg Oral Daily   pneumococcal 23 valent vaccine  0.5 mL Intramuscular Tomorrow-1000   polyethylene glycol  17 g Oral BID   senna-docusate  1 tablet Oral BID   sodium chloride flush  10-40 mL Intracatheter Q12H   sodium chloride flush  3 mL Intravenous Q12H   Continuous Infusions:  sodium chloride Stopped (10/03/21 2150)     LOS: 5 days   Time spent: 35mins Greater than 50% of this time was spent in counseling, explanation of diagnosis, planning of further management, and coordination of care.   Voice Recognition Reubin Milan/Dragon dictation system was used to create this note, attempts have been made to correct errors. Please contact the author with questions and/or clarifications.   Albertine GratesFang Brileigh Sevcik, MD PhD FACP [facility]  Available via Epic secure chat 7am-7pm for nonurgent issues Please page for urgent issues To page the attending provider between 7A-7P or the covering provider during after hours 7P-7A, please log into the web site [email or web address] and access using universal [place] password for that web site. If you do not  have the password, please call the hospital operator.    10/08/2021, 8:25 AM

## 2021-10-08 NOTE — TOC Initial Note (Addendum)
Transition of Care [facility]) - Initial/Assessment Note    Patient Details  Name: Brett White MRN: [id number] Date of Birth: January 28, 1961  [facility]) CM/SW Contact:    Gabrielle Dare Phone Number: 10/08/2021, 4:40 PM  Clinical Narrative:                 CSW spoke with pt by phone.  CSW introduced self and role.  Pt lives in an apartment in a room.  Pt stated that he is not able to move around in apartment.  He goes only to the back door and back in room.  Pt reports that he sleeps 22 hours a day because he is not able to move around in the apartment and it is not handicap accessible.  CSW give pt medicare.gov website to review the SNF that have accepted.  Pt is agreeable to SNF placement.  Pt has been vaccinated for Covid.  TOC will continue to assist with disposition planning.  Expected Discharge Plan: Skilled Nursing Facility Barriers to Discharge: SNF Pending bed offer, Continued Medical Work up, Ship broker   Patient Goals and CMS Choice Patient states their goals for this hospitalization and ongoing recovery are:: Rehab CMS Medicare.gov Compare Post Acute Care list provided to:: Patient Represenative (must comment) (pt given medicare.gov website) Choice offered to / list presented to : Patient  Expected Discharge Plan and Services Expected Discharge Plan: [place] In-house Referral: Clinical Social Work   Post Acute Care Choice: [place] Living arrangements for the past 2 months: Apartment                                      Prior Living Arrangements/Services Living arrangements for the past 2 months: Apartment Lives with:: Self Patient language and need for interpreter reviewed:: Yes Do you feel safe going back to the place where you live?: Yes      Need for Family Participation in Patient Care: Yes (Comment) Care giver support system in place?: Yes (comment)   Criminal Activity/Legal Involvement  Pertinent to Current Situation/Hospitalization: No - Comment as needed  Activities of Daily Living Home Assistive Devices/Equipment: Environmental consultant (specify type), Other (Comment), Wheelchair (rollator) ADL Screening (condition at time of admission) Patient's cognitive ability adequate to safely complete daily activities?: Yes Is the patient deaf or have difficulty hearing?: No Does the patient have difficulty seeing, even when wearing glasses/contacts?: No Does the patient have difficulty concentrating, remembering, or making decisions?: No Patient able to express need for assistance with ADLs?: No Does the patient have difficulty dressing or bathing?: Yes (has difficultywith mobility with bathtub transfers) Independently performs ADLs?: Yes (appropriate for developmental age) Does the patient have difficulty walking or climbing stairs?: Yes Weakness of Legs: Left (L. BKA) Weakness of Arms/Hands: None  Permission Sought/Granted Permission sought to share information with : Case Freight forwarder, Customer service manager, Family Supports Permission granted to share information with : Yes, Verbal Permission Granted  Share Information with NAME: Joesph July  Permission granted to share info w AGENCY: SNF's  Permission granted to share info w Relationship: Ex Wife IT consultant)  Permission granted to share info w Contact Information: Yes  Emotional Assessment Appearance:: Appears stated age Attitude/Demeanor/Rapport: Engaged Affect (typically observed): Appropriate Orientation: : Oriented to Self, Oriented to Place, Oriented to  Time, Oriented to Situation Alcohol / Substance Use: Not Applicable Psych Involvement: No (comment)  Admission diagnosis:  Cellulitis [L03.90] N&V (nausea and vomiting) [R11.2] Abscess of upper back excluding scapular region [L02.212] Patient Active Problem List   Diagnosis Date Noted   AKI (acute kidney injury) ([place]) 10/04/2021   Cellulitis 10/03/2021    Acquired absence of left leg below knee ([place]) 04/05/2021   Left foot infection 03/17/2021   Pain in left ankle and joints of left foot    Back abscess 03/03/2021   Osteomyelitis of great toe of left foot ([place]) 02/03/2021   Uncontrolled type 2 diabetes mellitus with hyperglycemia ([place]) 02/03/2021   Essential hypertension 02/03/2021   Lactic acidosis 02/03/2021   Diabetic peripheral neuropathy associated with type 2 diabetes mellitus ([place]) 02/03/2021   Osteomyelitis ([place]) 02/03/2021   PCP:  Arthur Holms, NP Pharmacy:   [facility] [place], [place] [address] [place] [address] Phone: [phone] Fax: [phone]     Social Determinants of Health (SDOH) Interventions    Readmission Risk Interventions No flowsheet data found.

## 2021-10-08 NOTE — Progress Notes (Signed)
Inpatient Diabetes Program Recommendations  AACE/ADA: New Consensus Statement on Inpatient Glycemic Control   Target Ranges:  Prepandial:   less than 140 mg/dL      Peak postprandial:   less than 180 mg/dL (1-2 hours)      Critically ill patients:  140 - 180 mg/dL    Review of Glycemic Control  Latest Reference Range & Units 10/07/21 08:09 10/07/21 11:57 10/07/21 17:08 10/07/21 21:11 10/08/21 07:57  Glucose-Capillary 70 - 99 mg/dL 262 (H) 035 (H) 597 (H) 227 (H) 216 (H)   Diabetes history: DM2 Outpatient Diabetes medications: Lantus 5 units BID (not taken in 1 month), Novolog 5 units TID (not taken in 1 month), Metformin 500 mg daily Current orders for Inpatient glycemic control:  70/30 10 units bid Novolog 0-20 units tid + hs Novolog 3 units tid meal coverage  Inpatient Diabetes Program Recommendations:    -   Increase 70/30 to 15 units bid  -   Since 70/30 ordered may d/c meal coverage as the 30 part in 70/30 acts like meal coverage.  -   Reduce Novolog correction scale to 0-9 units tid + hs (to dose 70/30)      Supplies needed at d/c:  -   Novolin 70/30 (#[id number]) -   insulin pen needles (#[id number])   Thanks, Christena Deem RN, MSN, BC-ADM Inpatient Diabetes Coordinator Team Pager [phone] (8a-5p)

## 2021-10-08 NOTE — Plan of Care (Signed)
Pt has rested a couple hours during the night. Surgical dsg intact. Reinforced once this shift. No orders to change on to reinforce. Pt receiving pain medication every 1-3 hours. Alternating between dilaudid and oxy. Oxygen on at 3 L/[place] due to during sleep pt sats dropping in mid 80% Problem: Education: Goal: Knowledge of General Education information will improve Description: Including pain rating scale, medication(s)/side effects and non-pharmacologic comfort measures Outcome: Progressing   Problem: Health Behavior/Discharge Planning: Goal: Ability to manage health-related needs will improve Outcome: Progressing   Problem: Clinical Measurements: Goal: Ability to maintain clinical measurements within normal limits will improve Outcome: Progressing Goal: Will remain free from infection Outcome: Progressing Goal: Diagnostic test results will improve Outcome: Progressing Goal: Respiratory complications will improve Outcome: Progressing Goal: Cardiovascular complication will be avoided Outcome: Progressing   Problem: Activity: Goal: Risk for activity intolerance will decrease Outcome: Progressing   Problem: Nutrition: Goal: Adequate nutrition will be maintained Outcome: Progressing   Problem: Coping: Goal: Level of anxiety will decrease Outcome: Progressing   Problem: Elimination: Goal: Will not experience complications related to bowel motility Outcome: Progressing Goal: Will not experience complications related to urinary retention Outcome: Progressing   Problem: Pain Managment: Goal: General experience of comfort will improve Outcome: Progressing   Problem: Safety: Goal: Ability to remain free from injury will improve Outcome: Progressing   Problem: Skin Integrity: Goal: Risk for impaired skin integrity will decrease Outcome: Progressing   Problem: Clinical Measurements: Goal: Ability to avoid or minimize complications of infection will improve Outcome:  Progressing   Problem: Skin Integrity: Goal: Skin integrity will improve Outcome: Progressing

## 2021-10-09 ENCOUNTER — Inpatient Hospital Stay (HOSPITAL_COMMUNITY): Payer: Medicare HMO

## 2021-10-09 DIAGNOSIS — L0390 Cellulitis, unspecified: Secondary | ICD-10-CM | POA: Diagnosis not present

## 2021-10-09 LAB — BASIC METABOLIC PANEL
Anion gap: 8 (ref 5–15)
BUN: 30 mg/dL — ABNORMAL HIGH (ref 6–20)
CO2: 28 mmol/L (ref 22–32)
Calcium: 8.6 mg/dL — ABNORMAL LOW (ref 8.9–10.3)
Chloride: 96 mmol/L — ABNORMAL LOW (ref 98–111)
Creatinine, Ser: 1.42 mg/dL — ABNORMAL HIGH (ref 0.61–1.24)
GFR, Estimated: 57 mL/min — ABNORMAL LOW (ref >60)
Glucose, Bld: 295 mg/dL — ABNORMAL HIGH (ref 70–99)
Potassium: 4.6 mmol/L (ref 3.5–5.1)
Sodium: 132 mmol/L — ABNORMAL LOW (ref 135–145)

## 2021-10-09 LAB — CBC
HCT: 35.9 % — ABNORMAL LOW (ref 39.0–52.0)
Hemoglobin: 10.9 g/dL — ABNORMAL LOW (ref 13.0–17.0)
MCH: 26.8 pg (ref 26.0–34.0)
MCHC: 30.4 g/dL (ref 30.0–36.0)
MCV: 88.4 fL (ref 80.0–100.0)
Platelets: 422 10*3/uL — ABNORMAL HIGH (ref 150–400)
RBC: 4.06 MIL/uL — ABNORMAL LOW (ref 4.22–5.81)
RDW: 12.8 % (ref 11.5–15.5)
WBC: 6.6 10*3/uL (ref 4.0–10.5)
nRBC: 0 % (ref 0.0–0.2)

## 2021-10-09 LAB — GLUCOSE, CAPILLARY
Glucose-Capillary: 214 mg/dL — ABNORMAL HIGH (ref 70–99)
Glucose-Capillary: 196 mg/dL — ABNORMAL HIGH (ref 70–99)
Glucose-Capillary: 123 mg/dL — ABNORMAL HIGH (ref 70–99)
Glucose-Capillary: 85 mg/dL (ref 70–99)
Glucose-Capillary: 147 mg/dL — ABNORMAL HIGH (ref 70–99)

## 2021-10-09 IMAGING — MR MR SHOULDER*L* WO/W CM
4 of 8 series · 17 of 40 positions shown · IV contrast (Yes    MULTIHANCE)
Comparison: None.

CLINICAL DATA: Left upper back subcutaneous abscess status post
incision and drainage on [DATE]. "Septic arthritis suspected,
shoulder, xray done Osteomyelitis suspected, shoulder, xray done
Soft tissue infection suspected, shoulder, xray done please include
left upper back where the abscess is"

EXAM:
MRI OF THE LEFT SHOULDER WITHOUT AND WITH CONTRAST
TECHNIQUE: Multiplanar, multisequence MR imaging of the left shoulder shoulder
was performed before and after the administration of intravenous
contrast.
CONTRAST:  9mL GADAVIST GADOBUTROL 1 MMOL/ML IV SOLN

[Series 10: T2 fat-sat · axial · 4.0mm · 0.23mm/px · z∈[-50,+26]mm · 3 of 25 slices shown]
[im 5/25]
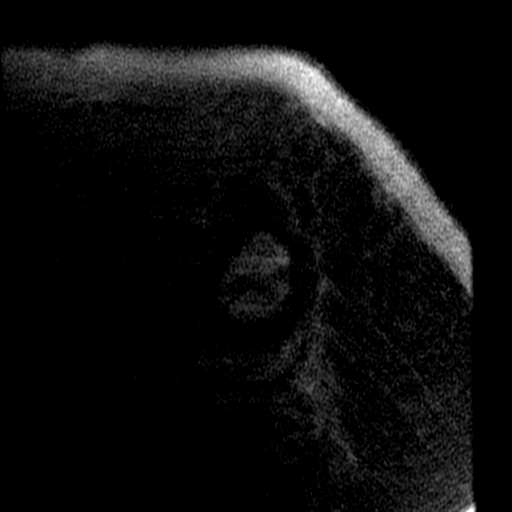
[im 13/25]
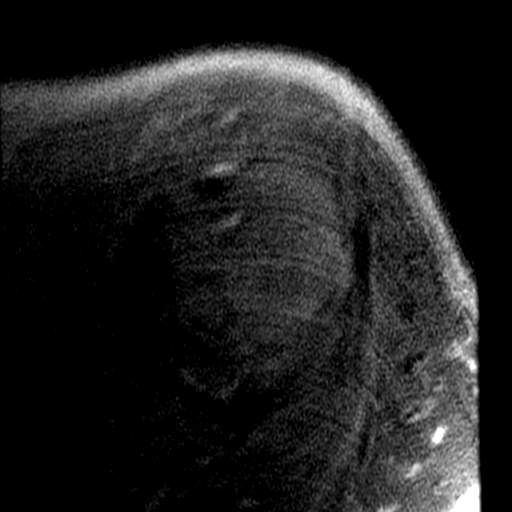
[im 21/25]
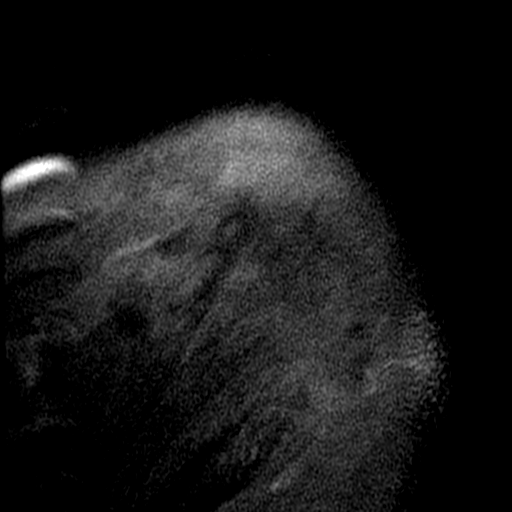

[Series 12: PD fat-sat · axial · 4.0mm · 0.29mm/px · z∈[-85,+39]mm · 6 of 27 slices shown]
[im 1/27]
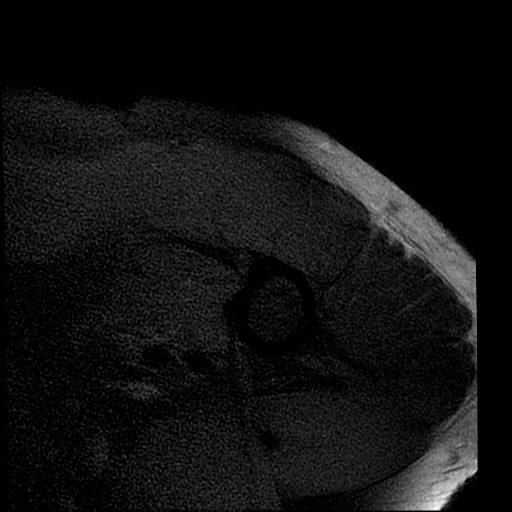
[im 6/27]
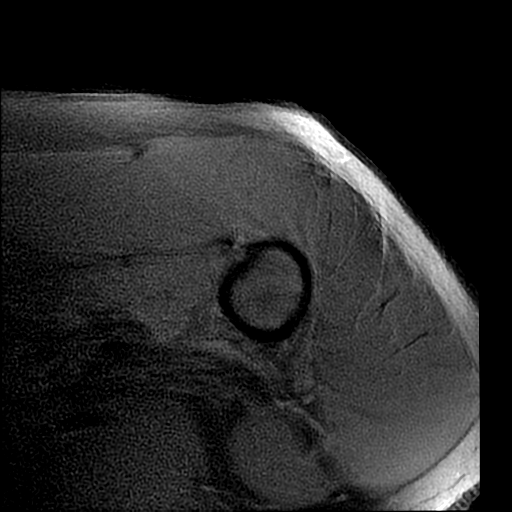
[im 11/27]
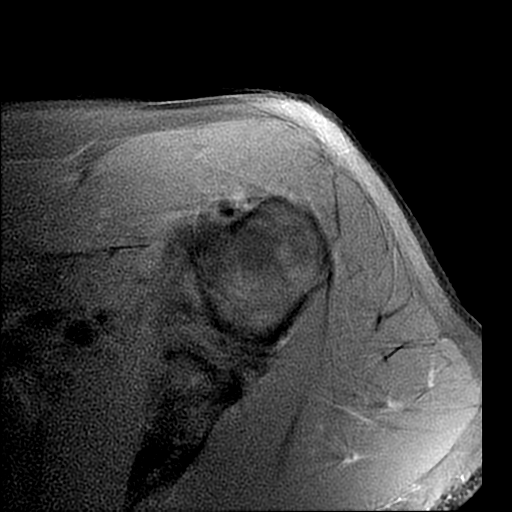
[im 16/27]
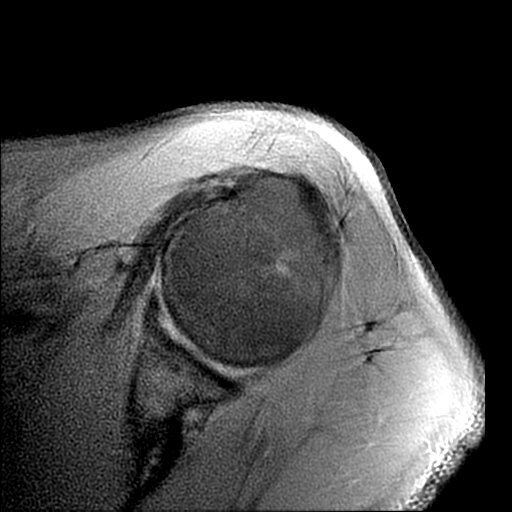
[im 21/27]
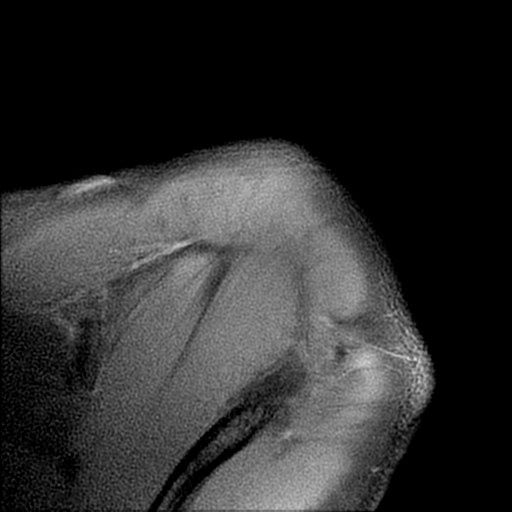
[im 27/27]
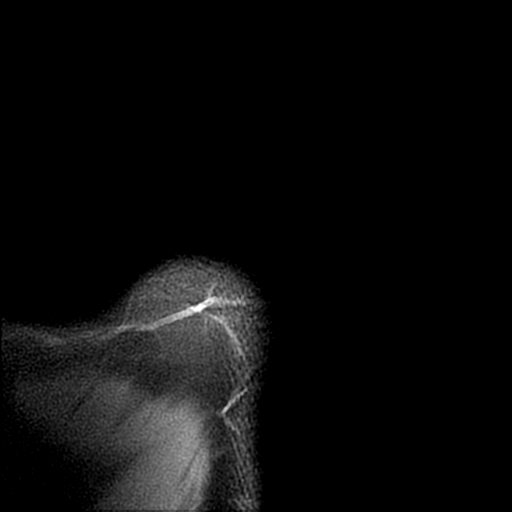

[Series 15: PD · sagittal · 4.0mm · 0.29mm/px · 4 of 19 slices shown]
[im 1/19]
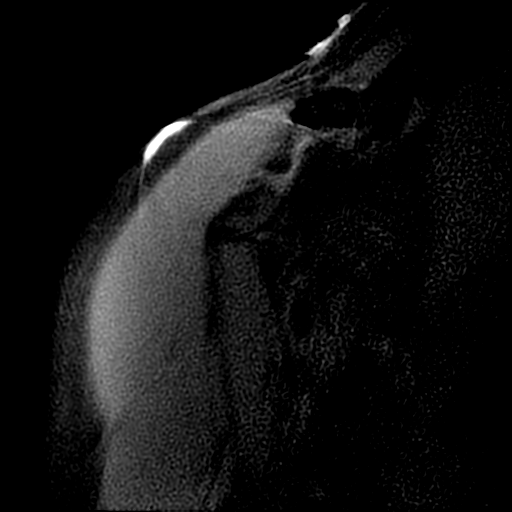
[im 7/19]
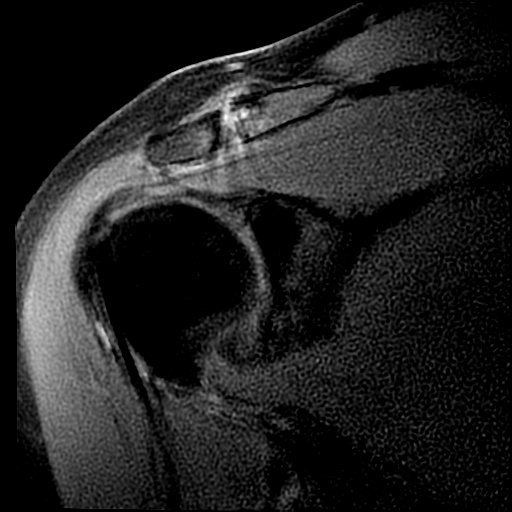
[im 13/19]
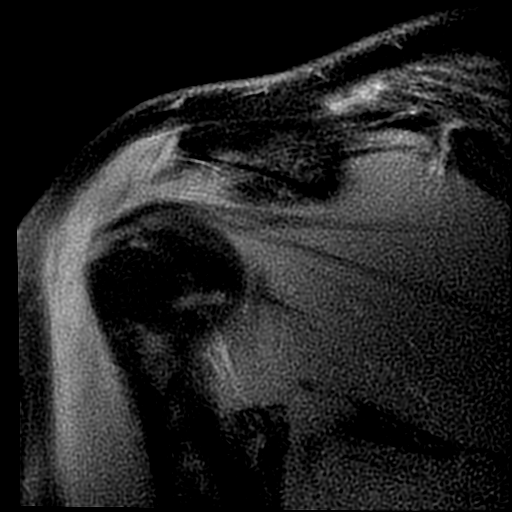
[im 19/19]
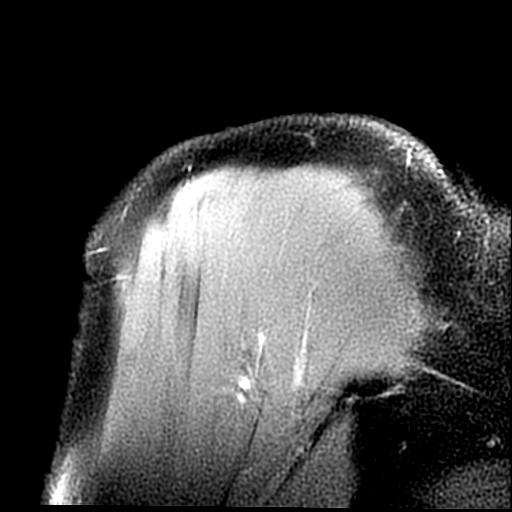

[Series 17: PD post-contrast · sagittal · 4.0mm · 0.29mm/px · 4 of 19 slices shown]
[im 1/19]
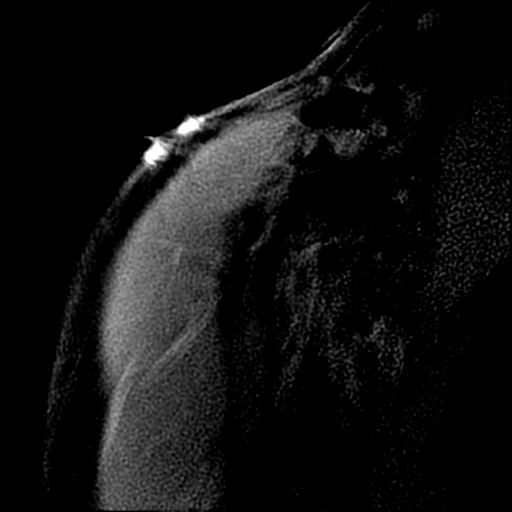
[im 7/19]
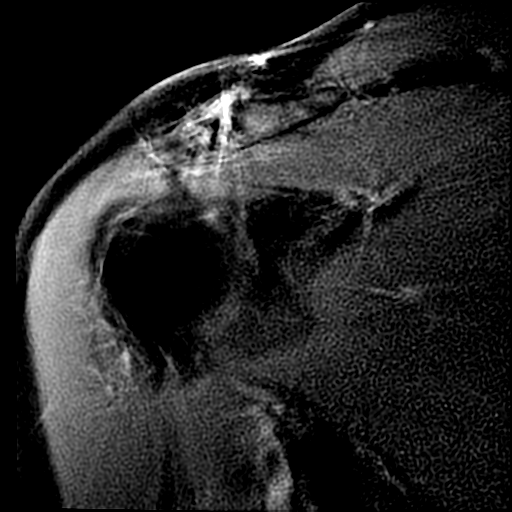
[im 13/19]
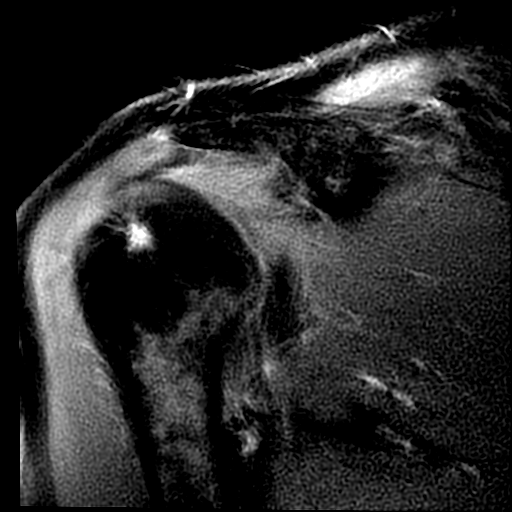
[im 19/19]
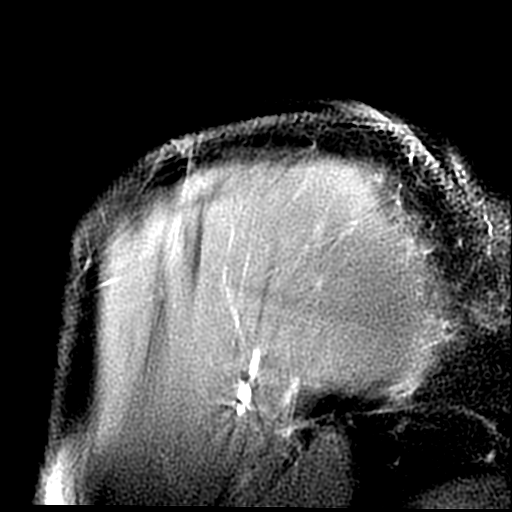

[17 of 40 positions shown; findings below may reference images not displayed]

FINDINGS: Pre and postcontrast sequences were obtained through the posterior
left upper back at site of recent incision and drainage. There is a
surgical defect contiguous with a underlying complex collection
within the subcutaneous tissues of the upper back. Base of the
defect/wound measures approximately 2.8 x 2.7 cm. There is marked
soft tissue edema and enhancement. Irregular area of central non
enhancement within the subcutaneous tissues underlying the surgical
site (series 9, images 13-19) without a well-defined fluid
collection on the STIR or T2 weighted sequences. This may represent
a combination of necrotic tissue and surgical packing material. The
region of inflammatory changes within the soft tissues measures
approximately 22 x 3 x 12 cm (series 7, image 20; series 3, image
16). Reactive intramuscular edema within the underlying trapezius
muscle. No intramuscular fluid collection.

Technical Note: Despite efforts by the technologist and patient,
motion artifact is present on today's exam and could not be
eliminated. This reduces exam sensitivity and specificity.

Rotator cuff: The supraspinatus, infraspinatus, subscapularis, and
teres minor tendons are intact.

Muscles: Preserved bulk and signal intensity of the rotator cuff
musculature without edema, atrophy, or fatty infiltration.

Biceps long head:  Intact and appropriately positioned.

Acromioclavicular Joint: Mild degenerative changes of the AC joint.
No significant subacromial-subdeltoid bursal fluid.

Glenohumeral Joint: No joint effusion. No cartilage defect.

Labrum: Grossly intact although evaluation is limited in the absence
of intra-articular fluid. No paralabral cyst.

Bones: No acute fracture. No dislocation. No bone marrow edema. No
marrow replacing bone lesion.

Other: None.
IMPRESSION: 1. Postsurgical defect of the posterior left upper back at site of
recent incision and drainage. There is marked surrounding soft
tissue edema and enhancement compatible with cellulitis, which
involves an approximately 22 x 12 cm area of tissue. Irregular area
of central non-enhancement within the subcutaneous tissues
underlying the surgical site without a well-defined fluid collection
on the STIR or T2 weighted sequences. This may represent a
combination of necrotic tissue and surgical packing material.
2. No drainable fluid collections are evident at this time.
3. Reactive intramuscular edema within the underlying trapezius
muscle. No intramuscular fluid collection.
4. No evidence of septic arthritis of the left shoulder.
5. No internal derangement of the left shoulder.

## 2021-10-09 MED ORDER — GADOBUTROL 1 MMOL/ML IV SOLN
9.0000 mL | Freq: Once | INTRAVENOUS | Status: AC | PRN
  Administered 2021-10-09: 9 mL via INTRAVENOUS

## 2021-10-09 MED ORDER — GADOBUTROL 1 MMOL/ML IV SOLN: 9.0000 mL | Freq: Once | INTRAVENOUS | Status: DC | PRN

## 2021-10-09 MED ORDER — ENOXAPARIN SODIUM 40 MG/0.4ML IJ SOSY
40.0000 mg | PREFILLED_SYRINGE | INTRAMUSCULAR | Status: DC
  Administered 2021-10-09 – 2021-10-19 (×11): 40 mg via SUBCUTANEOUS
  Filled 2021-10-09 (×11): qty 0.4

## 2021-10-09 MED ORDER — BISACODYL 5 MG PO TBEC
5.0000 mg | DELAYED_RELEASE_TABLET | Freq: Once | ORAL | Status: AC
  Administered 2021-10-09: 5 mg via ORAL
  Filled 2021-10-09: qty 1

## 2021-10-09 MED ORDER — ACETAMINOPHEN 500 MG PO TABS
1000.0000 mg | ORAL_TABLET | Freq: Four times a day (QID) | ORAL | Status: DC
  Administered 2021-10-09 – 2021-10-17 (×30): 1000 mg via ORAL
  Filled 2021-10-09 (×31): qty 2

## 2021-10-09 MED ORDER — INSULIN ASPART PROT & ASPART (70-30 MIX) 100 UNIT/ML ~~LOC~~ SUSP
15.0000 [IU] | Freq: Two times a day (BID) | SUBCUTANEOUS | Status: DC
  Administered 2021-10-09 – 2021-10-11 (×4): 15 [IU] via SUBCUTANEOUS
  Filled 2021-10-09: qty 10

## 2021-10-09 MED ORDER — SODIUM CHLORIDE 0.9 % IV SOLN: INTRAVENOUS | Status: AC

## 2021-10-09 NOTE — TOC Progression Note (Signed)
Transition of Care [facility]) - Progression Note    Patient Details  Name: Deaundre Allston MRN: [id number] Date of Birth: 1961-08-23  [facility]) CM/SW Contact  Mearl Latin, LCSW Phone Number: 10/09/2021, 11:48 AM  Clinical Narrative:    CSW spoke with patient regarding SNF choice ([facility], [facility], or [facility]). He is requesting whichever one that has a private room. CSW will continue to follow for medical readiness to determine which SNFs have beds available.    Expected Discharge Plan: Skilled Nursing Facility Barriers to Discharge: SNF Pending bed offer, Continued Medical Work up, English as a second language teacher  Expected Discharge Plan and Services Expected Discharge Plan: Skilled Nursing Facility In-house Referral: Clinical Social Work   Post Acute Care Choice: Skilled Nursing Facility Living arrangements for the past 2 months: Apartment                                       Social Determinants of Health (SDOH) Interventions    Readmission Risk Interventions No flowsheet data found.

## 2021-10-09 NOTE — Consult Note (Signed)
[facility]    Date of Admission:  10/03/2021   Total days of inpatient antibiotics 7        Reason for Consult: Back wound    Principal Problem:   Cellulitis Active Problems:   Uncontrolled type 2 diabetes mellitus with hyperglycemia (HCC)   Back abscess   AKI (acute kidney injury) (HCC)   Assessment: 61 YM with uncontrolled DM, Hx of left  foot MRSA infection SP BKA admitted for back abscess. He recived once dose of dalbavancin on 10/03/21. Would had increased drainage int the interim , taken to OR on 10/06/21 for debridement and Cx+ MRSA.  #Back abscess SP I&D 10/06/21 with Cx + MRSA #Uncontrolled DM -Overall, wound appears improved. If MRI is concerning for underlying abscess will start IV antibiotics with vancomycin. Recommendations:  -Follow-up MRI -D/C pip-tazo -Start doxycycline tomorrow and treat for an additional 2 weeks for skin soft tissue infection if MRI showes no underlying abscess.   Microbiology:   Antibiotics: Dalbavancin 1/3 Pip-tazo 1/6-1/8  Cultures: Blood 1/3 NGTD OR Cx 10/06/21: MRSA  HPI: Brett White is a 61 y.o. 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 He presented to the ED with progressive pain and swelling near the left scapula. Pain and swelling started about two days afo, pt belives he may have had a spider bite(although no insect was seen). He had associated fevers and chills. On arrival to the ED, He had a temp of 102.8., wbc 16.6 K and underwent I&D in the ED. He received a dose of dalbavancin. Ultimately admitted for further monitoring. Wound appeared tp be getting worse and pt underwent I&D in the OR on 10/06/21, Cx+ MRSA.  Today, he reports pain is about the same. He now denies fever and chills   Review of Systems: Review of Systems  All other systems reviewed and are negative.  Past Medical History:  Diagnosis Date   Diabetes  mellitus without complication (HCC)    Stroke (HCC)     Social History   Tobacco Use   Smoking status: Never   Smokeless tobacco: Never  Substance Use Topics   Alcohol use: Not Currently   Drug use: Not Currently    Family History  Problem Relation Age of Onset   Heart attack Neg Hx    Scheduled Meds:  acetaminophen  1,000 mg Oral Q6H   aspirin  81 mg Oral Daily   atorvastatin  10 mg Oral Daily   DULoxetine  30 mg Oral Daily   enoxaparin (LOVENOX) injection  40 mg Subcutaneous Q24H   gabapentin  300 mg Oral TID   insulin aspart  0-20 Units Subcutaneous TID WC   insulin aspart  0-5 Units Subcutaneous QHS   insulin aspart protamine- aspart  15 Units Subcutaneous BID WC   metoprolol tartrate  12.5 mg Oral BID   pantoprazole  40 mg Oral Daily   pneumococcal 23 valent vaccine  0.5 mL Intramuscular Tomorrow-1000   polyethylene glycol  17 g Oral BID   senna-docusate  1 tablet Oral BID   sodium chloride flush  10-40 mL Intracatheter Q12H   sodium chloride flush  3 mL Intravenous Q12H   Continuous Infusions:  sodium chloride 75 mL/hr at 10/09/21 1253   PRN Meds:.gadobutrol, HYDROmorphone (DILAUDID) injection, ondansetron (ZOFRAN) IV, oxyCODONE, senna-docusate, sodium chloride flush No Known Allergies  OBJECTIVE: Blood pressure Marland Kitchen)  145/80, pulse 80, temperature 98.8 F (37.1 C), temperature source Oral, resp. rate 15, height 6\' 2"  (1.88 m), weight 95.3 kg, SpO2 90 %.  Physical Exam Constitutional:      General: He is not in acute distress.    Appearance: He is normal weight. He is not toxic-appearing.  HENT:     Head: Normocephalic and atraumatic.     Right Ear: External ear normal.     Left Ear: External ear normal.     Nose: No congestion or rhinorrhea.     Mouth/Throat:     Mouth: Mucous membranes are moist.     Pharynx: Oropharynx is clear.  Eyes:     Extraocular Movements: Extraocular movements intact.     Conjunctiva/sclera: Conjunctivae normal.     Pupils:  Pupils are equal, round, and reactive to light.  Cardiovascular:     Rate and Rhythm: Normal rate and regular rhythm.     Heart sounds: No murmur heard.   No friction rub. No gallop.  Pulmonary:     Effort: Pulmonary effort is normal.     Breath sounds: Normal breath sounds.  Abdominal:     General: Abdomen is flat. Bowel sounds are normal.     Palpations: Abdomen is soft.  Musculoskeletal:        General: No swelling. Normal range of motion.     Cervical back: Normal range of motion and neck supple.  Skin:    General: Skin is warm and dry.     Comments: [place] back wound without drainage, surrounding erythema appaers to be improving.   Neurological:     General: No focal deficit present.     Mental Status: He is oriented to person, place, and time.  Psychiatric:        Mood and Affect: Mood normal.    Lab Results Lab Results  Component Value Date   WBC 6.6 10/09/2021   HGB 10.9 (L) 10/09/2021   HCT 35.9 (L) 10/09/2021   MCV 88.4 10/09/2021   PLT 422 (H) 10/09/2021    Lab Results  Component Value Date   CREATININE 1.42 (H) 10/09/2021   BUN 30 (H) 10/09/2021   NA 132 (L) 10/09/2021   K 4.6 10/09/2021   CL 96 (L) 10/09/2021   CO2 28 10/09/2021    Lab Results  Component Value Date   ALT 11 10/03/2021   AST 14 (L) 10/03/2021   ALKPHOS 108 10/03/2021   BILITOT 1.1 10/03/2021       12/01/2021, MD [facility] 10/09/2021, 1:08 PM

## 2021-10-09 NOTE — Plan of Care (Signed)

## 2021-10-09 NOTE — Progress Notes (Signed)
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  The patient reported noticing some pain and swelling near the left scapula approximately 2 days prior to his presentation.  He thought he might have been bit by a spider.  He was going to be discharged home but developed fever and tachycardia.    Assessment & Plan:   Principal Problem:   Cellulitis Active Problems:   Uncontrolled type 2 diabetes mellitus with hyperglycemia (HCC)   Back abscess   AKI (acute kidney injury) (HCC)   1-Abscess and cellulitis of the back near the left scapula, Sepsis; -Patient had I&D performed in the ED.  He received 1 dose of IV dalbavancin. Subsequently was admitted by [facility]. -Patient underwent excision and debridement of left upper back subcutaneous abscess and phlegmon in the OR on 1/6 -Surgical 1 growing staph -We will consult ID formally -Per ID IV Dalbavacin should last 7 days. -MRI shoulder and upper back pending.   2-insulin-dependent type 2 diabetes, uncontrolled with hyperglycemia A1c 12.2 -Patient reported that he is not able to afford insulin only taking metformin -Was a started on 7030--- increase to 15 BID, discontinue meals coverage/  -Continue with sliding scale insulin  Peripheral neuropathy: Need better diabetes control  AKI from dehydration and sepsis Continue to hold Cozaar Continue with hydration Cr peak 1.6 Cr down to 1.4  Hyponatremia: continue with IV fluids.   HTN: Continue to hold Cozaar due to AKI  PAD: Continue with aspirin and statins Right leg ABI noncompressible artery Will need follow-up with vascular surgery  History of  left BKA by Dr. 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  Constipation: KUB showed constipation Continue with laxative  Depression: Continue with Cymbalta  FFT, poor social support.  PT consulted    Estimated body mass index is 26.96 kg/m as calculated from the following:   Height as of this encounter: 6\' 2"  (1.88 m).   Weight as of this encounter: 95.3 kg.   DVT prophylaxis: Lovenox Code Status: Full code Family Communication: care discussed with patient Disposition Plan:  Status is: Inpatient  Remains inpatient appropriate because Back abscess infection         Consultants:  General surgery   Procedures:  I and D 1-6  Antimicrobials:    Subjective: He is still having shoulder pain and back pain.    Objective: Vitals:   10/08/21 1933 10/08/21 2322 10/09/21 0315 10/09/21 0747  BP: 140/79 (!) 144/91 (!) 153/97 (!) 160/89  Pulse: 77 75 75 78  Resp: 15 16 12 16   Temp: 97.9 F (36.6 C) 99.8 F (37.7 C) 98 F (36.7 C) 98.9 F (37.2 C)  TempSrc: Oral Oral Oral Oral  SpO2: 93% 93% 95% 93%  Weight:      Height:        Intake/Output Summary (Last 24 hours) at 10/09/2021 1212 Last data filed at 10/09/2021 0748 Gross per 24 hour  Intake 633.46 ml  Output 1000 ml  Net -366.54 ml   Filed Weights   10/03/21 1317 10/06/21 1547  Weight: 95.3 kg 95.3 kg    Examination:  General exam: Appears calm and comfortable  Respiratory system: Clear to auscultation. Respiratory effort normal.  Cardiovascular system: S1 & S2 heard, RRR. No JVD, murmurs, rubs, gallops or clicks. No pedal edema. Gastrointestinal system: Abdomen is nondistended, soft and nontender. No organomegaly or masses felt. Normal bowel sounds heard. Central nervous system: Alert and oriented.  Extremities: Symmetric 5 x 5 power. Skin: large wound near scapula with redness.    Data Reviewed: I have personally reviewed following labs and imaging studies  CBC: Recent Labs   Lab 10/03/21 1349 10/04/21 0218 10/05/21 0135 10/06/21 0158 10/06/21 1617 10/07/21 0027 10/08/21 0108 10/09/21 0147  WBC 16.6*   < > 16.5* 12.8*  --  13.5* 7.4 6.6  NEUTROABS 14.3*  --   --   --   --  11.1*  --   --   HGB 14.0   < > 12.2* 10.6* 12.6* 11.0* 10.6* 10.9*  HCT 42.0   < > 38.0* 32.9* 37.0* 34.1* 34.4* 35.9*  MCV 83.3   < > 84.8 84.6  --  85.3 88.0 88.4  PLT 320   < > 292 312  --  345 408* 422*   < > = values in this interval not displayed.   Basic Metabolic Panel: Recent Labs  Lab 10/05/21 0135 10/06/21 0158 10/06/21 1617 10/07/21 0027 10/08/21 0108 10/09/21 0147  NA 130* 129* 134* 133* 135 132*  K 3.6 3.8 4.1 4.3 4.8 4.6  CL 97* 96* 98 100 97* 96*  CO2 22 24  --  25 29 28   GLUCOSE 212* 168* 117* 332* 253* 295*  BUN 34* 28* 22* 25* 32* 30*  CREATININE 1.65* 1.04 0.80 1.18 1.68* 1.42*  CALCIUM 8.5* 8.3*  --  8.4* 8.6* 8.6*  MG 2.0  --   --   --   --   --    GFR: Estimated Creatinine Clearance: 65.9 mL/min (A) (by C-G formula based on SCr of 1.42 mg/dL (H)). Liver Function Tests: Recent Labs  Lab 10/03/21 1349  AST 14*  ALT 11  ALKPHOS 108  BILITOT 1.1  PROT 7.6  ALBUMIN 3.1*   No results for input(s): LIPASE, AMYLASE in the last 168 hours. No results for input(s): AMMONIA in the last 168 hours. Coagulation Profile: Recent Labs  Lab 10/03/21 1349  INR 1.0   Cardiac Enzymes: No results for input(s): CKTOTAL, CKMB, CKMBINDEX, TROPONINI in the last 168 hours. BNP (last 3 results) No results for input(s): PROBNP in the last 8760 hours. HbA1C: No results for input(s): HGBA1C in the last 72 hours. CBG: Recent Labs  Lab 10/08/21 1207 10/08/21 1604 10/08/21 2102 10/09/21 0755 10/09/21 1027  GLUCAP 164* 151* 171* 214* 196*   Lipid Profile: No results for input(s): CHOL, HDL, LDLCALC, TRIG, CHOLHDL, LDLDIRECT in the last 72 hours. Thyroid Function Tests: No results for input(s): TSH, T4TOTAL, FREET4, T3FREE, THYROIDAB in the last 72  hours. Anemia Panel: No results for input(s): VITAMINB12, FOLATE, FERRITIN, TIBC, IRON, RETICCTPCT in the last 72 hours. Sepsis Labs: Recent Labs  Lab 10/03/21 1349  LATICACIDVEN 1.6    Recent Results (from the past 240 hour(s))  Blood culture (routine single)     Status: None   Collection Time: 10/03/21  1:49 PM   Specimen: Site Not Specified; Blood  Result Value Ref Range Status   Specimen Description SITE NOT SPECIFIED  Final   Special Requests   Final    BOTTLES DRAWN AEROBIC AND ANAEROBIC Blood Culture adequate volume   Culture   Final    NO GROWTH 5 DAYS Performed at [facility],  [address] [place], [place] [address]    Report Status 10/08/2021 FINAL  Final  Resp Panel by RT-PCR (Flu A&B, Covid) Nasopharyngeal Swab     Status: None   Collection Time: 10/04/21 12:41 AM   Specimen: Nasopharyngeal Swab; Nasopharyngeal(NP) swabs in vial transport medium  Result Value Ref Range Status   SARS Coronavirus 2 by RT PCR NEGATIVE NEGATIVE Final    Comment: (NOTE) SARS-CoV-2 target nucleic acids are NOT DETECTED.  The SARS-CoV-2 RNA is generally detectable in upper respiratory specimens during the acute phase of infection. The lowest concentration of SARS-CoV-2 viral copies this assay can detect is 138 copies/mL. A negative result does not preclude SARS-Cov-2 infection and should not be used as the sole basis for treatment or other patient management decisions. A negative result may occur with  improper specimen collection/handling, submission of specimen other than nasopharyngeal swab, presence of viral mutation(s) within the areas targeted by this assay, and inadequate number of viral copies(<138 copies/mL). A negative result must be combined with clinical observations, patient history, and epidemiological information. The expected result is Negative.  Fact Sheet for Patients:  BloggerCourse.comhttps://www.fda.gov/media/152166/download  Fact Sheet for Healthcare Providers:   SeriousBroker.ithttps://www.fda.gov/media/152162/download  This test is no t yet approved or cleared by the [place] FDA and  has been authorized for detection and/or diagnosis of SARS-CoV-2 by FDA under an Emergency Use Authorization (EUA). This EUA will remain  in effect (meaning this test can be used) for the duration of the COVID-19 declaration under Section 564(b)(1) of the Act, 21 U.S.C.section 360bbb-3(b)(1), unless the authorization is terminated  or revoked sooner.       Influenza A by PCR NEGATIVE NEGATIVE Final   Influenza B by PCR NEGATIVE NEGATIVE Final    Comment: (NOTE) The Xpert Xpress SARS-CoV-2/FLU/RSV plus assay is intended as an aid in the diagnosis of influenza from Nasopharyngeal swab specimens and should not be used as a sole basis for treatment. Nasal washings and aspirates are unacceptable for Xpert Xpress SARS-CoV-2/FLU/RSV testing.  Fact Sheet for Patients: BloggerCourse.comhttps://www.fda.gov/media/152166/download  Fact Sheet for Healthcare Providers: SeriousBroker.ithttps://www.fda.gov/media/152162/download  This test is not yet approved or cleared by the [place] FDA and has been authorized for detection and/or diagnosis of SARS-CoV-2 by FDA under an Emergency Use Authorization (EUA). This EUA will remain in effect (meaning this test can be used) for the duration of the COVID-19 declaration under Section 564(b)(1) of the Act, 21 U.S.C. section 360bbb-3(b)(1), unless the authorization is terminated or revoked.  Performed at [facility], [address] [place], [place] [address]   MRSA Next Gen by PCR, Nasal     Status: None   Collection Time: 10/04/21 10:41 PM   Specimen: Nasal Mucosa; Nasal Swab  Result Value Ref Range Status   MRSA by PCR Next Gen NOT DETECTED NOT DETECTED Final    Comment: (NOTE) The GeneXpert MRSA Assay (FDA approved for NASAL specimens only), is one component of a comprehensive MRSA colonization surveillance program. It is not intended to diagnose  MRSA infection nor to guide or monitor treatment for MRSA infections. Test performance is not FDA approved in patients less than 59 years old. Performed at [facility], [address] [place], [place] [address]   Aerobic/Anaerobic Culture w Gram Stain (surgical/deep wound)     Status: None (Preliminary result)   Collection Time: 10/06/21  4:57 PM   Specimen: PATH Other; Tissue  Result Value Ref Range Status   Specimen Description ABSCESS  Final  Special Requests BACK ABCESS DRAINAGE SPEC A  Final   Gram Stain   Final    RARE WBC PRESENT, PREDOMINANTLY MONONUCLEAR RARE GRAM POSITIVE COCCI IN PAIRS Performed at [facility], [address] [place], [place] [address]    Culture   Final    MODERATE STAPHYLOCOCCUS AUREUS SUSCEPTIBILITIES PERFORMED ON PREVIOUS CULTURE WITHIN THE LAST 5 DAYS. NO ANAEROBES ISOLATED; CULTURE IN PROGRESS FOR 5 DAYS    Report Status PENDING  Incomplete  Aerobic/Anaerobic Culture w Gram Stain (surgical/deep wound)     Status: None (Preliminary result)   Collection Time: 10/06/21  4:59 PM   Specimen: PATH Other; Tissue  Result Value Ref Range Status   Specimen Description ABSCESS  Final   Special Requests BACK ABCESS TISSUE  Final   Gram Stain   Final    RARE WBC PRESENT,BOTH PMN AND MONONUCLEAR RARE GRAM POSITIVE COCCI Performed at [facility], [address], [place], [place] [address]    Culture   Final    MODERATE METHICILLIN RESISTANT STAPHYLOCOCCUS AUREUS NO ANAEROBES ISOLATED; CULTURE IN PROGRESS FOR 5 DAYS    Report Status PENDING  Incomplete   Organism ID, Bacteria METHICILLIN RESISTANT STAPHYLOCOCCUS AUREUS  Final      Susceptibility   Methicillin resistant staphylococcus aureus - MIC*    CIPROFLOXACIN <=0.5 SENSITIVE Sensitive     ERYTHROMYCIN >=8 RESISTANT Resistant     GENTAMICIN <=0.5 SENSITIVE Sensitive     OXACILLIN >=4 RESISTANT Resistant     TETRACYCLINE <=1 SENSITIVE Sensitive     VANCOMYCIN 1 SENSITIVE  Sensitive     TRIMETH/SULFA <=10 SENSITIVE Sensitive     CLINDAMYCIN <=0.25 SENSITIVE Sensitive     RIFAMPIN <=0.5 SENSITIVE Sensitive     Inducible Clindamycin NEGATIVE Sensitive     * MODERATE METHICILLIN RESISTANT STAPHYLOCOCCUS AUREUS         Radiology Studies: DG Shoulder Left  Result Date: 10/07/2021 CLINICAL DATA:  Left shoulder pain. EXAM: LEFT SHOULDER - 2+ VIEW COMPARISON:  None. FINDINGS: Tiny bone fragment along the inferior bony glenoid, likely chronic. An acute fracture is less likely. Clinical correlation is recommended. No other acute fracture. There is no dislocation. No significant arthritic changes. Mild degenerative changes of the left AC joint with spurring. The soft tissues are unremarkable. IMPRESSION: Tiny bone fragment along the inferior bony glenoid, likely chronic. An acute fracture is less likely. Electronically Signed   By: Elgie Collard M.D.   On: 10/07/2021 20:13        Scheduled Meds:  acetaminophen  1,000 mg Oral Q6H   aspirin  81 mg Oral Daily   atorvastatin  10 mg Oral Daily   DULoxetine  30 mg Oral Daily   gabapentin  300 mg Oral TID   insulin aspart  0-20 Units Subcutaneous TID WC   insulin aspart  0-5 Units Subcutaneous QHS   insulin aspart  3 Units Subcutaneous TID WC   insulin aspart protamine- aspart  10 Units Subcutaneous BID WC   metoprolol tartrate  12.5 mg Oral BID   pantoprazole  40 mg Oral Daily   pneumococcal 23 valent vaccine  0.5 mL Intramuscular Tomorrow-1000   polyethylene glycol  17 g Oral BID   senna-docusate  1 tablet Oral BID   sodium chloride flush  10-40 mL Intracatheter Q12H   sodium chloride flush  3 mL Intravenous Q12H   Continuous Infusions:  sodium chloride 75 mL/hr at 10/09/21 0255     LOS: 6 days  Time spent: 35 minutes.    Alba Cory, MD [facility]   If 7PM-7AM, please contact night-coverage [email or web address]  10/09/2021, 12:12 PM

## 2021-10-09 NOTE — Progress Notes (Signed)
Patient ID: Brett White, male   DOB: September 12, 1961, 61 y.o.   MRN: [id number] [facility] Surgery Progress Note  3 Days Post-Op  Subjective: CC-  Still having a lot of pain in his back and in his L shoulder.  Just got back from MRI  Objective: Vital signs in last 24 hours: Temp:  [97.8 F (36.6 C)-99.8 F (37.7 C)] 98.9 F (37.2 C) (01/09 0747) Pulse Rate:  [70-81] 78 (01/09 0747) Resp:  [12-17] 16 (01/09 0747) BP: (115-160)/(76-97) 160/89 (01/09 0747) SpO2:  [92 %-98 %] 93 % (01/09 0747) Last BM Date: 10/05/21  Intake/Output from previous day: 01/08 0701 - 01/09 0700 In: 633.5 [I.V.:583.5; IV Piggyback:50] Out: 700 [Urine:700] Intake/Output this shift: Total I/O In: -  Out: 600 [Urine:600]  PE: Gen: NAD Resp: nonlabored Back: open wound with some fibrinous debris, no bleeding, surrounding skin still with erythema and induration, but less intense than before surgery.  No further purulent drainage noted.  Seems a bit hypersensitive to touch    Lab Results:  Recent Labs    10/08/21 0108 10/09/21 0147  WBC 7.4 6.6  HGB 10.6* 10.9*  HCT 34.4* 35.9*  PLT 408* 422*   BMET Recent Labs    10/08/21 0108 10/09/21 0147  NA 135 132*  K 4.8 4.6  CL 97* 96*  CO2 29 28  GLUCOSE 253* 295*  BUN 32* 30*  CREATININE 1.68* 1.42*  CALCIUM 8.6* 8.6*   PT/INR No results for input(s): LABPROT, INR in the last 72 hours. CMP     Component Value Date/Time   NA 132 (L) 10/09/2021 0147   K 4.6 10/09/2021 0147   CL 96 (L) 10/09/2021 0147   CO2 28 10/09/2021 0147   GLUCOSE 295 (H) 10/09/2021 0147   BUN 30 (H) 10/09/2021 0147   CREATININE 1.42 (H) 10/09/2021 0147   CALCIUM 8.6 (L) 10/09/2021 0147   PROT 7.6 10/03/2021 1349   ALBUMIN 3.1 (L) 10/03/2021 1349   AST 14 (L) 10/03/2021 1349   ALT 11 10/03/2021 1349   ALKPHOS 108 10/03/2021 1349   BILITOT 1.1 10/03/2021 1349   GFRNONAA 57 (L) 10/09/2021 0147   Lipase     Component Value Date/Time   LIPASE 22  02/21/2021 2134       Studies/Results: DG Shoulder Left  Result Date: 10/07/2021 CLINICAL DATA:  Left shoulder pain. EXAM: LEFT SHOULDER - 2+ VIEW COMPARISON:  None. FINDINGS: Tiny bone fragment along the inferior bony glenoid, likely chronic. An acute fracture is less likely. Clinical correlation is recommended. No other acute fracture. There is no dislocation. No significant arthritic changes. Mild degenerative changes of the left AC joint with spurring. The soft tissues are unremarkable. IMPRESSION: Tiny bone fragment along the inferior bony glenoid, likely chronic. An acute fracture is less likely. Electronically Signed   By: Anner Crete M.D.   On: 10/07/2021 20:13    Anti-infectives: Anti-infectives (From admission, onward)    Start     Dose/Rate Route Frequency Ordered Stop   10/08/21 1400  piperacillin-tazobactam (ZOSYN) IVPB 3.375 g        3.375 g 12.5 mL/hr over 240 Minutes Intravenous Every 8 hours 10/08/21 0918 10/08/21 1623   10/07/21 1015  piperacillin-tazobactam (ZOSYN) IVPB 3.375 g        3.375 g 12.5 mL/hr over 240 Minutes Intravenous On call to O.R. 10/07/21 0919 10/07/21 1555   10/06/21 1145  piperacillin-tazobactam (ZOSYN) IVPB 3.375 g        3.375 g 12.5 mL/hr  over 240 Minutes Intravenous On call to O.R. 10/06/21 1046 10/06/21 1644   10/03/21 1745  dalbavancin (DALVANCE) 1,500 mg in dextrose 5 % 500 mL IVPB        1,500 mg 967.7 mL/hr over 31 Minutes Intravenous Once 10/03/21 1736 10/03/21 2234        Assessment/Plan  POD#3, s/p Excision and debridement of left upper back subcutaneous abscess and phlegmon 1/6 Dr. Kae Heller - Cultures MODERATE STAPHYLOCOCCUS AUREUS, CULTURE REINCUBATED FOR BETTER GROWTH, NO ANAEROBES ISOLATED, report pending - MRI of the shoulder is pending - Recommend continuing abx and follow cultures - Wound is clean overall but still with some fibrin present -cont dressing changes to clean this up prior to possibly putting a VAC on. -c/o  nerve type pain like burning etc.  Already on gabapentin 300mg  TID.  Will schedule tylenol and has oxy 5-10mg  ordered -d/w Dr. Tyrell Antonio at bedside regarding abx regimen and wound management  FEN - IVF, carb mod diet VTE - ok for chemical dvt prophylaxis from surgical standpoint, ASA 81mg  ID - dalbavancin 1/3, zosyn 1/6 >>1/8  Uncontrolled IDDM HTN PAD Hx L BKA Depression  Moderate Medical Decision Making   LOS: 6 days    Henreitta Cea, [facility] 10/09/2021, 10:54 AM Please see Amion for pager number during day hours 7:00am-4:30pm

## 2021-10-09 NOTE — Progress Notes (Signed)
Physical Therapy Treatment Patient Details Name: Brett White MRN: [id number] DOB: 06-25-1961 Today's Date: 10/09/2021   History of Present Illness 61 y.o. male presents to [facility] hospital on 10/03/2021 with progressive pain and swelling near left scapula, admitted for management of cellulitis. Pt underwent L shoulder I&D abscess. Repeat I&D 10/06/21; 1/9 MRI shoulder pending  Pt also with hyponatremia. PMH includes DMII, HTN, L BKA.    PT Comments    Despite pre-medication for pain (both oral and IV meds), pt with 9/10 pain in left shoulder.  Agreed to sit EOB and perform seated exercises, but due to pain refused to attempt standing with RW. Encouragement provided, but pt stated he just couldn't take the pain.    Recommendations for follow up therapy are one component of a multi-disciplinary discharge planning process, led by the attending physician.  Recommendations may be updated based on patient status, additional functional criteria and insurance authorization.  Follow Up Recommendations  Skilled nursing-short term rehab (<3 hours/day)     Assistance Recommended at Discharge Intermittent Supervision/Assistance  Patient can return home with the following A little help with walking and/or transfers;A little help with bathing/dressing/bathroom;Assistance with cooking/housework;Assist for transportation;Help with stairs or ramp for entrance   Equipment Recommendations  None recommended by PT    Recommendations for Other Services       Precautions / Restrictions Precautions Precautions: Fall Precaution Comments: old L BKA     Mobility  Bed Mobility Overal bed mobility: Needs Assistance Bed Mobility: Supine to Sit     Supine to sit: HOB elevated;Supervision     General bed mobility comments: allowed HOB elevated due to Left shoulder pain and pt trying to minimize use of LUE    Transfers                   General transfer comment: pt refused due to left shoulder pain;  offered again at end of exercises and still refused    Ambulation/Gait                   Stairs             Wheelchair Mobility    Modified Rankin (Stroke Patients Only)       Balance Overall balance assessment: Needs assistance Sitting-balance support: No upper extremity supported;Feet supported Sitting balance-Leahy Scale: Good                                      Cognition Arousal/Alertness: Awake/alert Behavior During Therapy: WFL for tasks assessed/performed Overall Cognitive Status: Within Functional Limits for tasks assessed                                          Exercises General Exercises - Lower Extremity Long Arc Quad: AROM;Both;Seated (20 reps, 15 reps, 10 reps) Hip Flexion/Marching: AROM;Both;20 reps;Seated Other Exercises Other Exercises: Rt shoulder flexion for challenging balance in sitting. Pt could not raise LUE due to severe pain    General Comments        Pertinent Vitals/Pain Pain Assessment: 0-10 Pain Score: 9  Pain Location: L scapula Pain Descriptors / Indicators: Pounding;Aching Pain Intervention(s): Limited activity within patient's tolerance    Home Living  Prior Function            PT Goals (current goals can now be found in the care plan section) Acute Rehab PT Goals Patient Stated Goal: to reduced L shoulder discomfort and return to independence Time For Goal Achievement: 10/19/21 Potential to Achieve Goals: Good Progress towards PT goals: Not progressing toward goals - comment (due to pain despite pre-medication)    Frequency    Min 3X/week      PT Plan Current plan remains appropriate    Co-evaluation              AM-PAC PT "6 Clicks" Mobility   Outcome Measure  Help needed turning from your back to your side while in a flat bed without using bedrails?: A Little Help needed moving from lying on your back to sitting on the  side of a flat bed without using bedrails?: A Little Help needed moving to and from a bed to a chair (including a wheelchair)?: A Little Help needed standing up from a chair using your arms (e.g., wheelchair or bedside chair)?: A Little Help needed to walk in hospital room?: Total Help needed climbing 3-5 steps with a railing? : Total 6 Click Score: 14    End of Session   Activity Tolerance: Patient limited by pain Patient left: with call bell/phone within reach;in bed;with bed alarm set Nurse Communication: Other (comment) (EOB, exercises only) PT Visit Diagnosis: Other abnormalities of gait and mobility (R26.89);Muscle weakness (generalized) (M62.81);Pain Pain - Right/Left: Left Pain - part of body: Shoulder     Time: [id number] PT Time Calculation (min) (ACUTE ONLY): 15 min  Charges:  $Therapeutic Exercise: 8-22 mins                      Arby Barrette, PT Acute Rehabilitation Services  Pager [phone] Office [phone]    Rexanne Mano 10/09/2021, 1:31 PM

## 2021-10-09 NOTE — Plan of Care (Signed)
Pt vitals stable. Receiving PRN doses of oxy and dilaudid approx every 2-3 hours. Dsg changed approx 2000 area to the left of I &D site swollen. MRI ordered 1/8 not completed at this time.  Problem: Education: Goal: Knowledge of General Education information will improve Description: Including pain rating scale, medication(s)/side effects and non-pharmacologic comfort measures Outcome: Progressing   Problem: Health Behavior/Discharge Planning: Goal: Ability to manage health-related needs will improve Outcome: Progressing   Problem: Clinical Measurements: Goal: Ability to maintain clinical measurements within normal limits will improve Outcome: Progressing Goal: Will remain free from infection Outcome: Progressing Goal: Diagnostic test results will improve Outcome: Progressing Goal: Respiratory complications will improve Outcome: Progressing Goal: Cardiovascular complication will be avoided Outcome: Progressing   Problem: Activity: Goal: Risk for activity intolerance will decrease Outcome: Progressing   Problem: Nutrition: Goal: Adequate nutrition will be maintained Outcome: Progressing   Problem: Coping: Goal: Level of anxiety will decrease Outcome: Progressing   Problem: Elimination: Goal: Will not experience complications related to bowel motility Outcome: Progressing Goal: Will not experience complications related to urinary retention Outcome: Progressing   Problem: Pain Managment: Goal: General experience of comfort will improve Outcome: Progressing   Problem: Safety: Goal: Ability to remain free from injury will improve Outcome: Progressing   Problem: Skin Integrity: Goal: Risk for impaired skin integrity will decrease Outcome: Progressing   Problem: Clinical Measurements: Goal: Ability to avoid or minimize complications of infection will improve Outcome: Progressing   Problem: Skin Integrity: Goal: Skin integrity will improve Outcome: Progressing

## 2021-10-10 DIAGNOSIS — L0390 Cellulitis, unspecified: Secondary | ICD-10-CM | POA: Diagnosis not present

## 2021-10-10 LAB — CBC
HCT: 33.9 % — ABNORMAL LOW (ref 39.0–52.0)
Hemoglobin: 10.6 g/dL — ABNORMAL LOW (ref 13.0–17.0)
MCH: 26.9 pg (ref 26.0–34.0)
MCHC: 31.3 g/dL (ref 30.0–36.0)
MCV: 86 fL (ref 80.0–100.0)
Platelets: 434 10*3/uL — ABNORMAL HIGH (ref 150–400)
RBC: 3.94 MIL/uL — ABNORMAL LOW (ref 4.22–5.81)
RDW: 12.6 % (ref 11.5–15.5)
WBC: 6.7 10*3/uL (ref 4.0–10.5)
nRBC: 0 % (ref 0.0–0.2)

## 2021-10-10 LAB — GLUCOSE, CAPILLARY
Glucose-Capillary: 192 mg/dL — ABNORMAL HIGH (ref 70–99)
Glucose-Capillary: 146 mg/dL — ABNORMAL HIGH (ref 70–99)
Glucose-Capillary: 220 mg/dL — ABNORMAL HIGH (ref 70–99)
Glucose-Capillary: 144 mg/dL — ABNORMAL HIGH (ref 70–99)

## 2021-10-10 LAB — BASIC METABOLIC PANEL
Anion gap: 10 (ref 5–15)
BUN: 20 mg/dL (ref 6–20)
CO2: 28 mmol/L (ref 22–32)
Calcium: 8.9 mg/dL (ref 8.9–10.3)
Chloride: 95 mmol/L — ABNORMAL LOW (ref 98–111)
Creatinine, Ser: 1.05 mg/dL (ref 0.61–1.24)
GFR, Estimated: >60 mL/min (ref >60)
Glucose, Bld: 178 mg/dL — ABNORMAL HIGH (ref 70–99)
Potassium: 4.4 mmol/L (ref 3.5–5.1)
Sodium: 133 mmol/L — ABNORMAL LOW (ref 135–145)

## 2021-10-10 IMAGING — MR MR SHOULDER*L* WO/W CM
4 of 9 series · 12 of 40 positions shown · IV contrast (gadavist)
Comparison: None.

CLINICAL DATA: Left upper back subcutaneous abscess status post
incision and drainage on [DATE]. "Septic arthritis suspected,
shoulder, xray done Osteomyelitis suspected, shoulder, xray done
Soft tissue infection suspected, shoulder, xray done please include
left upper back where the abscess is"

EXAM:
MRI OF THE LEFT SHOULDER WITHOUT AND WITH CONTRAST
TECHNIQUE: Multiplanar, multisequence MR imaging of the left shoulder shoulder
was performed before and after the administration of intravenous
contrast.
CONTRAST:  9mL GADAVIST GADOBUTROL 1 MMOL/ML IV SOLN

[Series 3: T2 fat-sat · axial · 5.0mm · 0.51mm/px · z∈[-117,+139]mm · 4 of 53 slices shown]
[im 1/53]
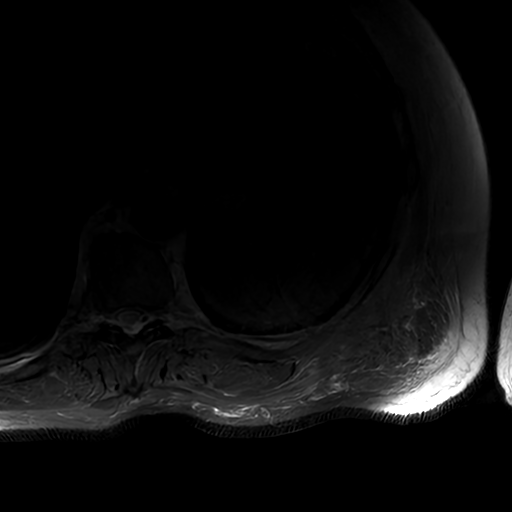
[im 9/53]
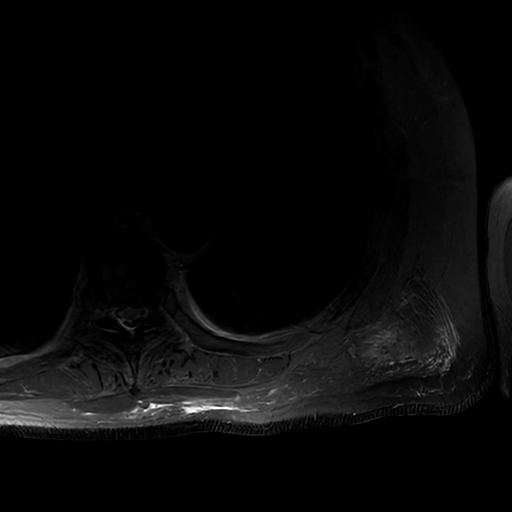
[im 27/53]
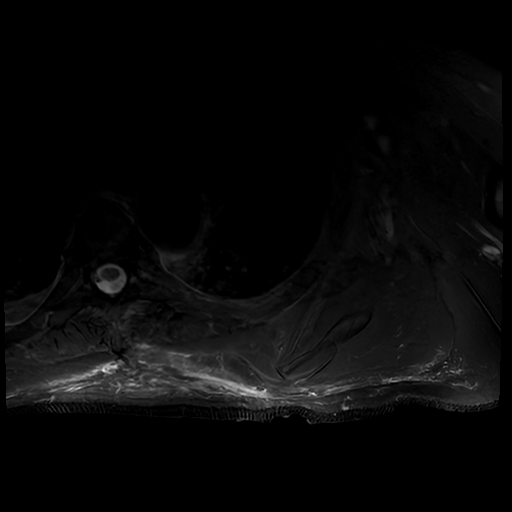
[im 44/53]
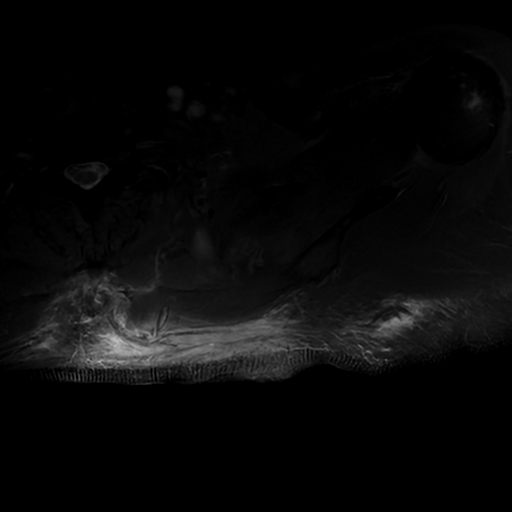

[Series 5: T1 · coronal · 4.0mm · 0.37mm/px · 2 of 22 slices shown (1 of 2)]
[im 1/22]
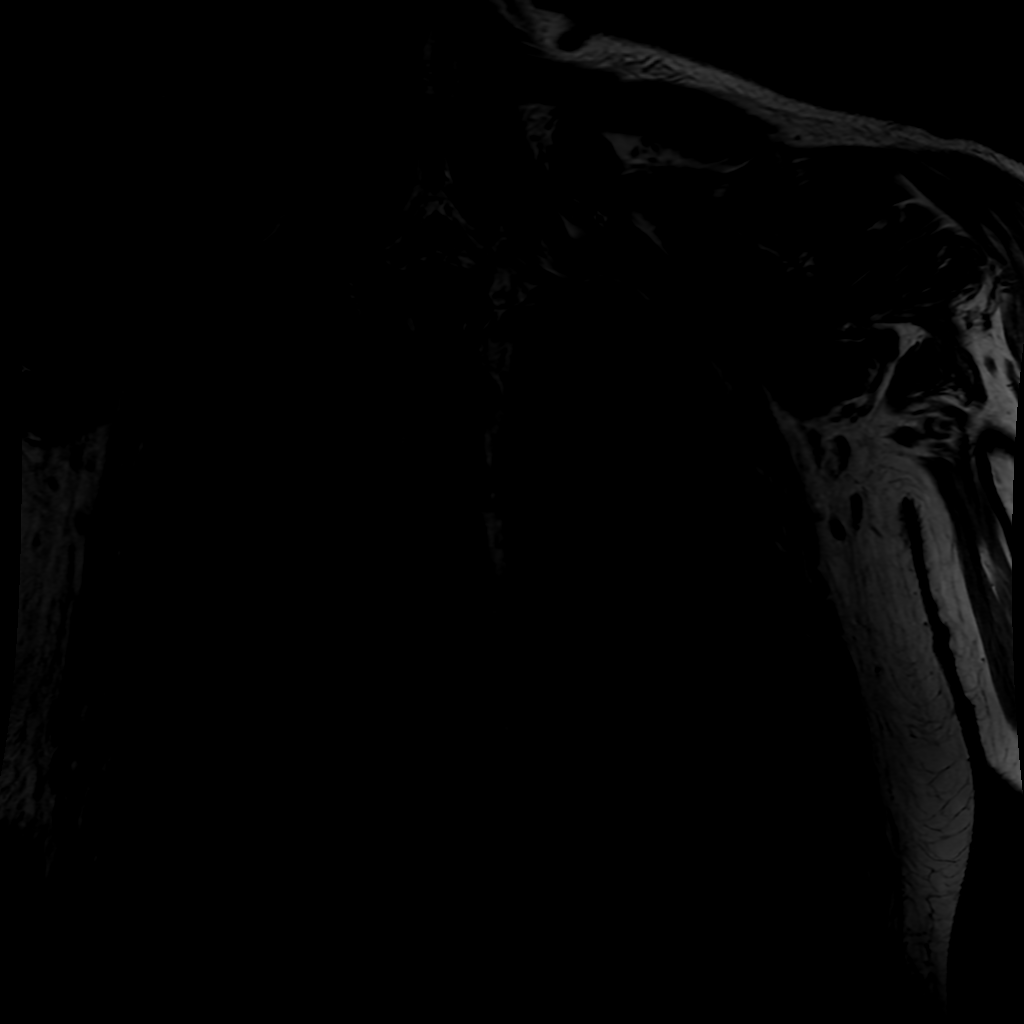
[im 22/22]
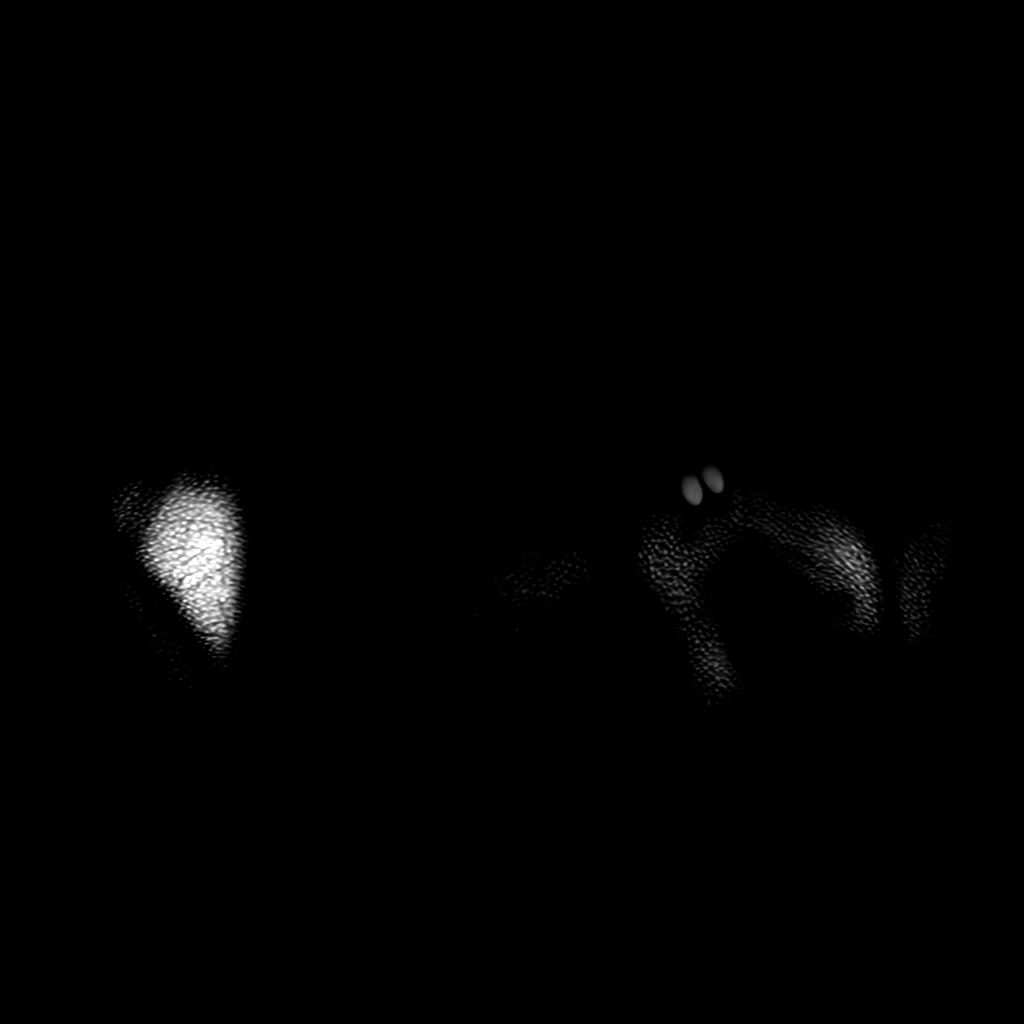

[Series 6: T1 · axial · 5.0mm · 0.51mm/px · z∈[-57,+193]mm · 3 of 53 slices shown (2 of 2)]
[im 11/53]
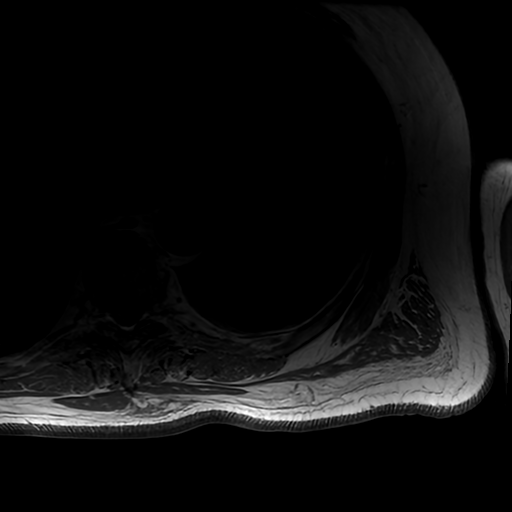
[im 32/53]
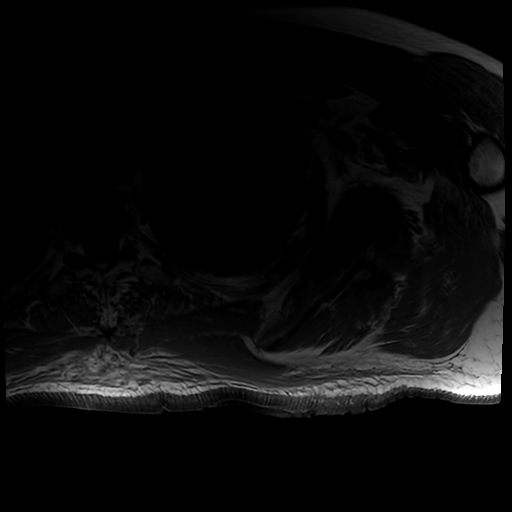
[im 53/53]
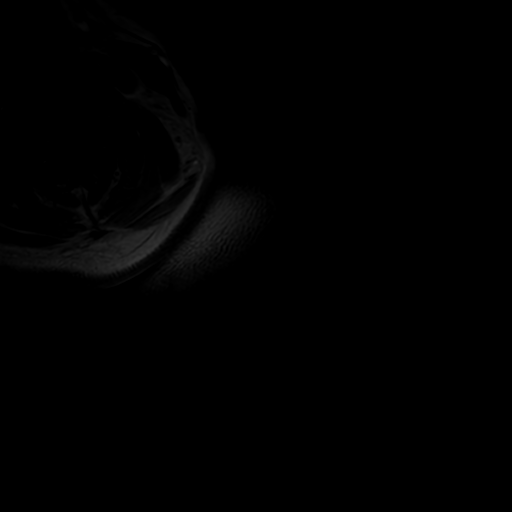

[Series 8: T1 fat-sat · axial · non-contrast · 5.0mm · 0.51mm/px · z∈[-57,+193]mm · 3 of 53 slices shown]
[im 11/53]
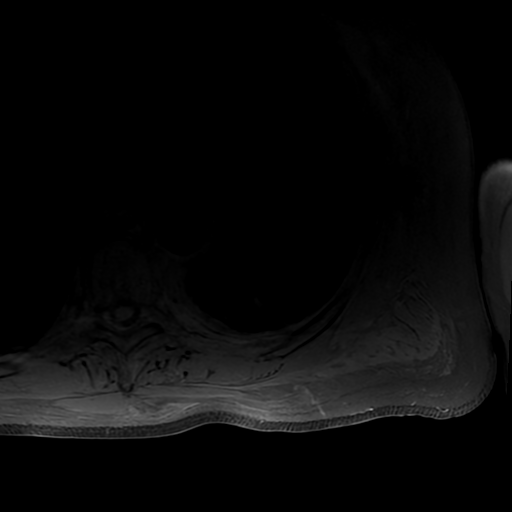
[im 32/53]
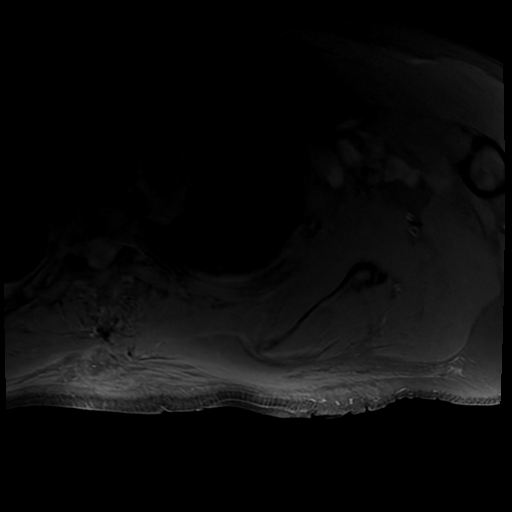
[im 53/53]
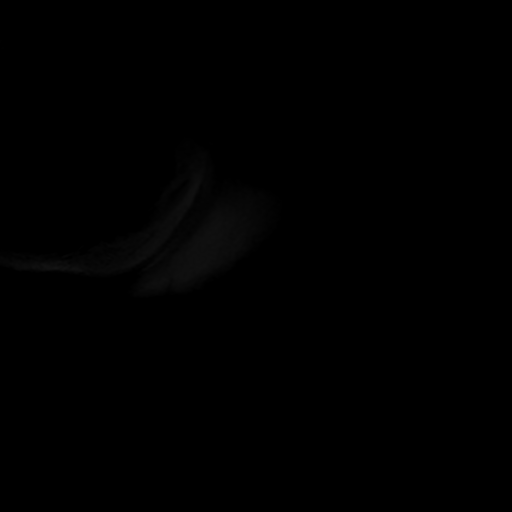

[12 of 40 positions shown; findings below may reference images not displayed]

FINDINGS: Pre and postcontrast sequences were obtained through the posterior
left upper back at site of recent incision and drainage. There is a
surgical defect contiguous with a underlying complex collection
within the subcutaneous tissues of the upper back. Base of the
defect/wound measures approximately 2.8 x 2.7 cm. There is marked
soft tissue edema and enhancement. Irregular area of central non
enhancement within the subcutaneous tissues underlying the surgical
site (series 9, images 13-19) without a well-defined fluid
collection on the STIR or T2 weighted sequences. This may represent
a combination of necrotic tissue and surgical packing material. The
region of inflammatory changes within the soft tissues measures
approximately 22 x 3 x 12 cm (series 7, image 20; series 3, image
16). Reactive intramuscular edema within the underlying trapezius
muscle. No intramuscular fluid collection.

Technical Note: Despite efforts by the technologist and patient,
motion artifact is present on today's exam and could not be
eliminated. This reduces exam sensitivity and specificity.

Rotator cuff: The supraspinatus, infraspinatus, subscapularis, and
teres minor tendons are intact.

Muscles: Preserved bulk and signal intensity of the rotator cuff
musculature without edema, atrophy, or fatty infiltration.

Biceps long head:  Intact and appropriately positioned.

Acromioclavicular Joint: Mild degenerative changes of the AC joint.
No significant subacromial-subdeltoid bursal fluid.

Glenohumeral Joint: No joint effusion. No cartilage defect.

Labrum: Grossly intact although evaluation is limited in the absence
of intra-articular fluid. No paralabral cyst.

Bones: No acute fracture. No dislocation. No bone marrow edema. No
marrow replacing bone lesion.

Other: None.
IMPRESSION: 1. Postsurgical defect of the posterior left upper back at site of
recent incision and drainage. There is marked surrounding soft
tissue edema and enhancement compatible with cellulitis, which
involves an approximately 22 x 12 cm area of tissue. Irregular area
of central non-enhancement within the subcutaneous tissues
underlying the surgical site without a well-defined fluid collection
on the STIR or T2 weighted sequences. This may represent a
combination of necrotic tissue and surgical packing material.
2. No drainable fluid collections are evident at this time.
3. Reactive intramuscular edema within the underlying trapezius
muscle. No intramuscular fluid collection.
4. No evidence of septic arthritis of the left shoulder.
5. No internal derangement of the left shoulder.

## 2021-10-10 MED ORDER — DOXYCYCLINE HYCLATE 100 MG PO TABS
100.0000 mg | ORAL_TABLET | Freq: Two times a day (BID) | ORAL | Status: DC
  Administered 2021-10-10 – 2021-10-20 (×21): 100 mg via ORAL
  Filled 2021-10-10 (×21): qty 1

## 2021-10-10 MED ORDER — GABAPENTIN 400 MG PO CAPS
400.0000 mg | ORAL_CAPSULE | Freq: Three times a day (TID) | ORAL | Status: DC
  Administered 2021-10-10 – 2021-10-20 (×31): 400 mg via ORAL
  Filled 2021-10-10 (×31): qty 1

## 2021-10-10 MED ORDER — AMLODIPINE BESYLATE 5 MG PO TABS
5.0000 mg | ORAL_TABLET | Freq: Every day | ORAL | Status: DC
  Administered 2021-10-10 – 2021-10-20 (×11): 5 mg via ORAL
  Filled 2021-10-10 (×11): qty 1

## 2021-10-10 NOTE — Progress Notes (Signed)
Patient ID: Brett White, male   DOB: 03/30/1961, 61 y.o.   MRN: [id number] [facility] Surgery Progress Note  4 Days Post-Op  Subjective: CC-  Feels about the same as yesterday. Continues to have a lot of pain at the surgical site extending towards his left shoulder.  Objective: Vital signs in last 24 hours: Temp:  [98.7 F (37.1 C)-98.9 F (37.2 C)] 98.7 F (37.1 C) (01/10 0430) Pulse Rate:  [69-83] 69 (01/09 2340) Resp:  [12-20] 13 (01/10 0430) BP: (131-152)/(80-94) 131/82 (01/10 0430) SpO2:  [90 %-98 %] 96 % (01/10 0430) Last BM Date: 10/05/21  Intake/Output from previous day: 01/09 0701 - 01/10 0700 In: 605.3 [P.O.:240; I.V.:365.3] Out: 2600 [Urine:2600] Intake/Output this shift: No intake/output data recorded.  PE: Gen: NAD Resp: nonlabored Back: open wound with some fibrinous debris, no bleeding, surrounding skin still with erythema and induration, no purulent drainage noted.  Seems a bit hypersensitive to touch  Lab Results:  Recent Labs    10/09/21 0147 10/10/21 0103  WBC 6.6 6.7  HGB 10.9* 10.6*  HCT 35.9* 33.9*  PLT 422* 434*   BMET Recent Labs    10/08/21 0108 10/09/21 0147  NA 135 132*  K 4.8 4.6  CL 97* 96*  CO2 29 28  GLUCOSE 253* 295*  BUN 32* 30*  CREATININE 1.68* 1.42*  CALCIUM 8.6* 8.6*   PT/INR No results for input(s): LABPROT, INR in the last 72 hours. CMP     Component Value Date/Time   NA 132 (L) 10/09/2021 0147   K 4.6 10/09/2021 0147   CL 96 (L) 10/09/2021 0147   CO2 28 10/09/2021 0147   GLUCOSE 295 (H) 10/09/2021 0147   BUN 30 (H) 10/09/2021 0147   CREATININE 1.42 (H) 10/09/2021 0147   CALCIUM 8.6 (L) 10/09/2021 0147   PROT 7.6 10/03/2021 1349   ALBUMIN 3.1 (L) 10/03/2021 1349   AST 14 (L) 10/03/2021 1349   ALT 11 10/03/2021 1349   ALKPHOS 108 10/03/2021 1349   BILITOT 1.1 10/03/2021 1349   GFRNONAA 57 (L) 10/09/2021 0147   Lipase     Component Value Date/Time   LIPASE 22 02/21/2021 2134        Studies/Results: No results found.  Anti-infectives: Anti-infectives (From admission, onward)    Start     Dose/Rate Route Frequency Ordered Stop   10/10/21 1000  doxycycline (VIBRA-TABS) tablet 100 mg        100 mg Oral Every 12 hours 10/10/21 0909 10/24/21 0959   10/08/21 1400  piperacillin-tazobactam (ZOSYN) IVPB 3.375 g        3.375 g 12.5 mL/hr over 240 Minutes Intravenous Every 8 hours 10/08/21 0918 10/08/21 1623   10/07/21 1015  piperacillin-tazobactam (ZOSYN) IVPB 3.375 g        3.375 g 12.5 mL/hr over 240 Minutes Intravenous On call to O.R. 10/07/21 0919 10/07/21 1555   10/06/21 1145  piperacillin-tazobactam (ZOSYN) IVPB 3.375 g        3.375 g 12.5 mL/hr over 240 Minutes Intravenous On call to O.R. 10/06/21 1046 10/06/21 1644   10/03/21 1745  dalbavancin (DALVANCE) 1,500 mg in dextrose 5 % 500 mL IVPB        1,500 mg 967.7 mL/hr over 31 Minutes Intravenous Once 10/03/21 1736 10/03/21 2234        Assessment/Plan POD#4, s/p Excision and debridement of left upper back subcutaneous abscess and phlegmon 1/6 Dr. Kae Heller - Culture MODERATE MRSA - MRI of the shoulder is pending - Recommend continuing  abx and follow cultures. ID consulting and recommending doxycycline x2 weeks if MRI negative for abscess - Wound is clean overall but still with some fibrin present. Continue BID wet to dry dressing changes. Will consider vac once fibrinous exudate is less - shower with wound open - increase gabapentin to 400mg  TID   FEN - IVF, carb mod diet VTE - lovenox ID - dalbavancin 1/3, zosyn 1/6 >>1/8, doxy 1/10>>   Uncontrolled IDDM HTN PAD Hx L BKA Depression  Straightforward Medical Decision Making   LOS: 7 days    Wellington Hampshire, [facility] 10/10/2021, 9:26 AM Please see Amion for pager number during day hours 7:00am-4:30pm

## 2021-10-10 NOTE — Progress Notes (Signed)
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  The patient reported noticing some pain and swelling near the left scapula approximately 2 days prior to his presentation.  He thought he might have been bit by a spider.  He was going to be discharged home but developed fever and tachycardia.   Assessment & Plan:   Principal Problem:   Cellulitis Active Problems:   Uncontrolled type 2 diabetes mellitus with hyperglycemia (HCC)   Back abscess   AKI (acute kidney injury) (HCC)   1-Abscess and cellulitis of the back near the left scapula, Sepsis; -Patient had I&D performed in the ED.  He received 1 dose of IV dalbavancin. Subsequently was admitted by [facility]. -Patient underwent excision and debridement of left upper back subcutaneous abscess and phlegmon in the OR on 1/6 -Wound growing staph -MRI shoulder and upper back Negative for abscess or septic arthritis shoulder. Irregular area could be related to necrosis or surgical packing.  -Started on Doxy by ID for 2 weeks.  -follow Sx recommendations. Continue with wound care.   2-insulin-dependent type 2 diabetes, uncontrolled with hyperglycemia A1c 12.2 -Patient reported that he is not able to afford insulin only taking metformin -Was a started on 7030--- increase to 15 BID, discontinue meals coverage/  -Continue with sliding scale insulin  Peripheral neuropathy: Need better diabetes control  AKI from dehydration and sepsis Continue to hold Cozaar Continue with hydration Cr peak 1.6 Cr down to 1.4---1.0  Hyponatremia: continue with IV fluids.   HTN: Continue to hold Cozaar due to AKI Start  Norvasc 1/10.  PAD: Continue with aspirin and statins Right leg ABI noncompressible artery Will need follow-up with vascular surgery  History of left BKA by Dr. 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  Constipation: KUB showed constipation Continue with laxative  Depression: Continue with Cymbalta  FFT, poor social support.  PT consulted Needs SNF placement.     Estimated body mass index is 26.96 kg/m as calculated from the following:   Height as of this encounter: 6\' 2"  (1.88 m).   Weight as of this encounter: 95.3 kg.   DVT prophylaxis: Lovenox Code Status: Full code Family Communication: care discussed with patient Disposition Plan:  Status is: Inpatient  Remains inpatient appropriate because Back abscess infection  Needs SNF, D/C when clear by Surgery.        Consultants:  General surgery   Procedures:  I and D 1-6  Antimicrobials:    Subjective: Report back pain, shoulder pain.  No BM in 3 days.  Denies abdominal pain.    Objective: Vitals:   10/09/21 2340 10/10/21 0430 10/10/21 0948 10/10/21 1141  BP: (!) 148/94 131/82 (!) 161/93 (!) 167/92  Pulse: 69   70  Resp: 15 13 20 12   Temp: 98.7 F (37.1 C) 98.7 F (37.1 C) 98.9 F (37.2 C) 97.9 F (36.6 C)  TempSrc: Oral Oral Oral Oral  SpO2: 97% 96% 97% 96%  Weight:      Height:        Intake/Output Summary (Last 24 hours) at 10/10/2021 1402 Last data filed at 10/10/2021 0946 Gross per 24 hour  Intake 605.34 ml  Output 2800 ml  Net -  2194.66 ml    Filed Weights   10/03/21 1317 10/06/21 1547  Weight: 95.3 kg 95.3 kg    Examination:  General exam: NAD Respiratory system: CTA Cardiovascular system: S 1,. S 2 RRR Gastrointestinal system: BS present, soft, nt Central nervous system: Alert, follows command Extremities: no edema, S/p left BKA Skin: large wound near scapula with redness.    Data Reviewed: I have personally reviewed following labs and imaging  studies  CBC: Recent Labs  Lab 10/06/21 0158 10/06/21 1617 10/07/21 0027 10/08/21 0108 10/09/21 0147 10/10/21 0103  WBC 12.8*  --  13.5* 7.4 6.6 6.7  NEUTROABS  --   --  11.1*  --   --   --   HGB 10.6* 12.6* 11.0* 10.6* 10.9* 10.6*  HCT 32.9* 37.0* 34.1* 34.4* 35.9* 33.9*  MCV 84.6  --  85.3 88.0 88.4 86.0  PLT 312  --  345 408* 422* 434*    Basic Metabolic Panel: Recent Labs  Lab 10/05/21 0135 10/06/21 0158 10/06/21 1617 10/07/21 0027 10/08/21 0108 10/09/21 0147 10/10/21 1042  NA 130* 129* 134* 133* 135 132* 133*  K 3.6 3.8 4.1 4.3 4.8 4.6 4.4  CL 97* 96* 98 100 97* 96* 95*  CO2 22 24  --  25 29 28 28   GLUCOSE 212* 168* 117* 332* 253* 295* 178*  BUN 34* 28* 22* 25* 32* 30* 20  CREATININE 1.65* 1.04 0.80 1.18 1.68* 1.42* 1.05  CALCIUM 8.5* 8.3*  --  8.4* 8.6* 8.6* 8.9  MG 2.0  --   --   --   --   --   --     GFR: Estimated Creatinine Clearance: 89.2 mL/min (by C-G formula based on SCr of 1.05 mg/dL). Liver Function Tests: No results for input(s): AST, ALT, ALKPHOS, BILITOT, PROT, ALBUMIN in the last 168 hours.  No results for input(s): LIPASE, AMYLASE in the last 168 hours. No results for input(s): AMMONIA in the last 168 hours. Coagulation Profile: No results for input(s): INR, PROTIME in the last 168 hours.  Cardiac Enzymes: No results for input(s): CKTOTAL, CKMB, CKMBINDEX, TROPONINI in the last 168 hours. BNP (last 3 results) No results for input(s): PROBNP in the last 8760 hours. HbA1C: No results for input(s): HGBA1C in the last 72 hours. CBG: Recent Labs  Lab 10/09/21 1244 10/09/21 1621 10/09/21 2040 10/10/21 0933 10/10/21 1143  GLUCAP 123* 85 147* 192* 146*    Lipid Profile: No results for input(s): CHOL, HDL, LDLCALC, TRIG, CHOLHDL, LDLDIRECT in the last 72 hours. Thyroid Function Tests: No results for input(s): TSH, T4TOTAL, FREET4, T3FREE, THYROIDAB in the last 72 hours. Anemia Panel: No results for input(s): VITAMINB12, FOLATE,  FERRITIN, TIBC, IRON, RETICCTPCT in the last 72 hours. Sepsis Labs: No results for input(s): PROCALCITON, LATICACIDVEN in the last 168 hours.   Recent Results (from the past 240 hour(s))  Blood culture (routine single)     Status: None   Collection Time: 10/03/21  1:49 PM   Specimen: Site Not Specified; Blood  Result Value Ref Range Status   Specimen Description SITE NOT SPECIFIED  Final   Special Requests   Final    BOTTLES DRAWN AEROBIC AND ANAEROBIC Blood Culture adequate volume   Culture   Final    NO GROWTH 5 DAYS Performed at [facility], [address] [place], [place]    Report Status 10/08/2021 FINAL  Final  Resp Panel by RT-PCR (Flu A&B, Covid) Nasopharyngeal Swab  Status: None   Collection Time: 10/04/21 12:41 AM   Specimen: Nasopharyngeal Swab; Nasopharyngeal(NP) swabs in vial transport medium  Result Value Ref Range Status   SARS Coronavirus 2 by RT PCR NEGATIVE NEGATIVE Final    Comment: (NOTE) SARS-CoV-2 target nucleic acids are NOT DETECTED.  The SARS-CoV-2 RNA is generally detectable in upper respiratory specimens during the acute phase of infection. The lowest concentration of SARS-CoV-2 viral copies this assay can detect is 138 copies/mL. A negative result does not preclude SARS-Cov-2 infection and should not be used as the sole basis for treatment or other patient management decisions. A negative result may occur with  improper specimen collection/handling, submission of specimen other than nasopharyngeal swab, presence of viral mutation(s) within the areas targeted by this assay, and inadequate number of viral copies(<138 copies/mL). A negative result must be combined with clinical observations, patient history, and epidemiological information. The expected result is Negative.  Fact Sheet for Patients:  BloggerCourse.com  Fact Sheet for Healthcare Providers:  SeriousBroker.it  This  test is no t yet approved or cleared by the [place] FDA and  has been authorized for detection and/or diagnosis of SARS-CoV-2 by FDA under an Emergency Use Authorization (EUA). This EUA will remain  in effect (meaning this test can be used) for the duration of the COVID-19 declaration under Section 564(b)(1) of the Act, 21 U.S.C.section 360bbb-3(b)(1), unless the authorization is terminated  or revoked sooner.       Influenza A by PCR NEGATIVE NEGATIVE Final   Influenza B by PCR NEGATIVE NEGATIVE Final    Comment: (NOTE) The Xpert Xpress SARS-CoV-2/FLU/RSV plus assay is intended as an aid in the diagnosis of influenza from Nasopharyngeal swab specimens and should not be used as a sole basis for treatment. Nasal washings and aspirates are unacceptable for Xpert Xpress SARS-CoV-2/FLU/RSV testing.  Fact Sheet for Patients: BloggerCourse.com  Fact Sheet for Healthcare Providers: SeriousBroker.it  This test is not yet approved or cleared by the [place] FDA and has been authorized for detection and/or diagnosis of SARS-CoV-2 by FDA under an Emergency Use Authorization (EUA). This EUA will remain in effect (meaning this test can be used) for the duration of the COVID-19 declaration under Section 564(b)(1) of the Act, 21 U.S.C. section 360bbb-3(b)(1), unless the authorization is terminated or revoked.  Performed at [facility], [address] [place], [place] [address]   MRSA Next Gen by PCR, Nasal     Status: None   Collection Time: 10/04/21 10:41 PM   Specimen: Nasal Mucosa; Nasal Swab  Result Value Ref Range Status   MRSA by PCR Next Gen NOT DETECTED NOT DETECTED Final    Comment: (NOTE) The GeneXpert MRSA Assay (FDA approved for NASAL specimens only), is one component of a comprehensive MRSA colonization surveillance program. It is not intended to diagnose MRSA infection nor to guide or monitor treatment for  MRSA infections. Test performance is not FDA approved in patients less than 6 years old. Performed at [facility], [address], [place], [place] [address]   Aerobic/Anaerobic Culture w Gram Stain (surgical/deep wound)     Status: None (Preliminary result)   Collection Time: 10/06/21  4:57 PM   Specimen: PATH Other; Tissue  Result Value Ref Range Status   Specimen Description ABSCESS  Final   Special Requests BACK ABCESS DRAINAGE SPEC A  Final   Gram Stain   Final    RARE WBC PRESENT, PREDOMINANTLY MONONUCLEAR RARE GRAM POSITIVE COCCI IN  PAIRS Performed at [facility], [address] [place], [place] [address]    Culture   Final    MODERATE STAPHYLOCOCCUS AUREUS SUSCEPTIBILITIES PERFORMED ON PREVIOUS CULTURE WITHIN THE LAST 5 DAYS. NO ANAEROBES ISOLATED; CULTURE IN PROGRESS FOR 5 DAYS    Report Status PENDING  Incomplete  Aerobic/Anaerobic Culture w Gram Stain (surgical/deep wound)     Status: None (Preliminary result)   Collection Time: 10/06/21  4:59 PM   Specimen: PATH Other; Tissue  Result Value Ref Range Status   Specimen Description ABSCESS  Final   Special Requests BACK ABCESS TISSUE  Final   Gram Stain   Final    RARE WBC PRESENT,BOTH PMN AND MONONUCLEAR RARE GRAM POSITIVE COCCI Performed at [facility], [address] [place], [place] [address]    Culture   Final    MODERATE METHICILLIN RESISTANT STAPHYLOCOCCUS AUREUS NO ANAEROBES ISOLATED; CULTURE IN PROGRESS FOR 5 DAYS    Report Status PENDING  Incomplete   Organism ID, Bacteria METHICILLIN RESISTANT STAPHYLOCOCCUS AUREUS  Final      Susceptibility   Methicillin resistant staphylococcus aureus - MIC*    CIPROFLOXACIN <=0.5 SENSITIVE Sensitive     ERYTHROMYCIN >=8 RESISTANT Resistant     GENTAMICIN <=0.5 SENSITIVE Sensitive     OXACILLIN >=4 RESISTANT Resistant     TETRACYCLINE <=1 SENSITIVE Sensitive     VANCOMYCIN 1 SENSITIVE Sensitive     TRIMETH/SULFA <=10 SENSITIVE Sensitive      CLINDAMYCIN <=0.25 SENSITIVE Sensitive     RIFAMPIN <=0.5 SENSITIVE Sensitive     Inducible Clindamycin NEGATIVE Sensitive     * MODERATE METHICILLIN RESISTANT STAPHYLOCOCCUS AUREUS          Radiology Studies: MR SHOULDER LEFT W WO CONTRAST  Result Date: 10/10/2021 CLINICAL DATA:  Left upper back subcutaneous abscess status post incision and drainage on 10/06/2021. "Septic arthritis suspected, shoulder, xray done Osteomyelitis suspected, shoulder, xray done Soft tissue infection suspected, shoulder, xray done please include left upper back where the abscess is" EXAM: MRI OF THE LEFT SHOULDER WITHOUT AND WITH CONTRAST TECHNIQUE: Multiplanar, multisequence MR imaging of the left shoulder shoulder was performed before and after the administration of intravenous contrast. CONTRAST:  9mL GADAVIST GADOBUTROL 1 MMOL/ML IV SOLN COMPARISON:  None. FINDINGS: Pre and postcontrast sequences were obtained through the posterior left upper back at site of recent incision and drainage. There is a surgical defect contiguous with a underlying complex collection within the subcutaneous tissues of the upper back. Base of the defect/wound measures approximately 2.8 x 2.7 cm. There is marked soft tissue edema and enhancement. Irregular area of central non enhancement within the subcutaneous tissues underlying the surgical site (series 9, images 13-19) without a well-defined fluid collection on the STIR or T2 weighted sequences. This may represent a combination of necrotic tissue and surgical packing material. The region of inflammatory changes within the soft tissues measures approximately 22 x 3 x 12 cm (series 7, image 20; series 3, image 16). Reactive intramuscular edema within the underlying trapezius muscle. No intramuscular fluid collection. Technical Note: Despite efforts by the technologist and patient, motion artifact is present on today's exam and could not be eliminated. This reduces exam sensitivity and  specificity. Rotator cuff: The supraspinatus, infraspinatus, subscapularis, and teres minor tendons are intact. Muscles: Preserved bulk and signal intensity of the rotator cuff musculature without edema, atrophy, or fatty infiltration. Biceps long head:  Intact and appropriately positioned. Acromioclavicular Joint: Mild degenerative changes of the [facility]  joint. No significant subacromial-subdeltoid bursal fluid. Glenohumeral Joint: No joint effusion. No cartilage defect. Labrum: Grossly intact although evaluation is limited in the absence of intra-articular fluid. No paralabral cyst. Bones: No acute fracture. No dislocation. No bone marrow edema. No marrow replacing bone lesion. Other: None. IMPRESSION: 1. Postsurgical defect of the posterior left upper back at site of recent incision and drainage. There is marked surrounding soft tissue edema and enhancement compatible with cellulitis, which involves an approximately 22 x 12 cm area of tissue. Irregular area of central non-enhancement within the subcutaneous tissues underlying the surgical site without a well-defined fluid collection on the STIR or T2 weighted sequences. This may represent a combination of necrotic tissue and surgical packing material. 2. No drainable fluid collections are evident at this time. 3. Reactive intramuscular edema within the underlying trapezius muscle. No intramuscular fluid collection. 4. No evidence of septic arthritis of the left shoulder. 5. No internal derangement of the left shoulder. Electronically Signed   By: Duanne GuessNicholas  Plundo D.O.   On: 10/10/2021 11:06        Scheduled Meds:  acetaminophen  1,000 mg Oral Q6H   aspirin  81 mg Oral Daily   atorvastatin  10 mg Oral Daily   doxycycline  100 mg Oral Q12H   DULoxetine  30 mg Oral Daily   enoxaparin (LOVENOX) injection  40 mg Subcutaneous Q24H   gabapentin  400 mg Oral TID   insulin aspart  0-20 Units Subcutaneous TID WC   insulin aspart  0-5 Units Subcutaneous QHS    insulin aspart protamine- aspart  15 Units Subcutaneous BID WC   metoprolol tartrate  12.5 mg Oral BID   pantoprazole  40 mg Oral Daily   pneumococcal 23 valent vaccine  0.5 mL Intramuscular Tomorrow-1000   polyethylene glycol  17 g Oral BID   senna-docusate  1 tablet Oral BID   sodium chloride flush  10-40 mL Intracatheter Q12H   sodium chloride flush  3 mL Intravenous Q12H   Continuous Infusions:     LOS: 7 days    Time spent: 35 minutes.    Alba CoryBelkys A Zairah Arista, MD [facility]   If 7PM-7AM, please contact night-coverage [email or web address]  10/10/2021, 2:02 PM

## 2021-10-11 DIAGNOSIS — L0390 Cellulitis, unspecified: Secondary | ICD-10-CM | POA: Diagnosis not present

## 2021-10-11 DIAGNOSIS — E1165 Type 2 diabetes mellitus with hyperglycemia: Secondary | ICD-10-CM | POA: Diagnosis not present

## 2021-10-11 DIAGNOSIS — L02212 Cutaneous abscess of back [any part, except buttock]: Secondary | ICD-10-CM | POA: Diagnosis not present

## 2021-10-11 LAB — AEROBIC/ANAEROBIC CULTURE W GRAM STAIN (SURGICAL/DEEP WOUND)

## 2021-10-11 LAB — GLUCOSE, CAPILLARY
Glucose-Capillary: 186 mg/dL — ABNORMAL HIGH (ref 70–99)
Glucose-Capillary: 213 mg/dL — ABNORMAL HIGH (ref 70–99)
Glucose-Capillary: 162 mg/dL — ABNORMAL HIGH (ref 70–99)
Glucose-Capillary: 151 mg/dL — ABNORMAL HIGH (ref 70–99)

## 2021-10-11 MED ORDER — CEFADROXIL 500 MG PO CAPS
500.0000 mg | ORAL_CAPSULE | Freq: Two times a day (BID) | ORAL | Status: DC
  Administered 2021-10-12 – 2021-10-20 (×18): 500 mg via ORAL
  Filled 2021-10-11 (×19): qty 1

## 2021-10-11 MED ORDER — BISACODYL 5 MG PO TBEC
10.0000 mg | DELAYED_RELEASE_TABLET | Freq: Once | ORAL | Status: AC
Start: 2021-10-11 — End: 2021-10-11
  Administered 2021-10-11: 10 mg via ORAL
  Filled 2021-10-11: qty 2

## 2021-10-11 MED ORDER — INSULIN ASPART PROT & ASPART (70-30 MIX) 100 UNIT/ML ~~LOC~~ SUSP
18.0000 [IU] | Freq: Two times a day (BID) | SUBCUTANEOUS | Status: DC
  Administered 2021-10-11 – 2021-10-13 (×4): 18 [IU] via SUBCUTANEOUS
  Filled 2021-10-11 (×3): qty 10

## 2021-10-11 NOTE — Progress Notes (Signed)
Inpatient Diabetes Program Recommendations  AACE/ADA: New Consensus Statement on Inpatient Glycemic Control (2015)  Target Ranges:  Prepandial:   less than 140 mg/dL      Peak postprandial:   less than 180 mg/dL (1-2 hours)      Critically ill patients:  140 - 180 mg/dL   Lab Results  Component Value Date   GLUCAP 186 (H) 10/11/2021   HGBA1C 12.2 (H) 10/04/2021    Review of Glycemic Control  Latest Reference Range & Units 10/10/21 11:43 10/10/21 17:12 10/10/21 21:35 10/11/21 08:46  Glucose-Capillary 70 - 99 mg/dL 409 (H) 811 (H) 914 (H) 186 (H)   Diabetes history: DM 2 Outpatient Diabetes medications: Lantus 5 units BID (not taken in 1 month), Novolog 5 units TID (not taken in 1 month), Metformin 500 mg daily Current orders for Inpatient glycemic control:  70/30 15 units bid Novolog 0-20 units tid and HS Inpatient Diabetes Program Recommendations:   Consider increasing 70/30 to 18 units bid.   Thanks,  Beryl Meager, RN, BC-ADM Inpatient Diabetes Coordinator Pager [phone]  (8a-5p)

## 2021-10-11 NOTE — Progress Notes (Signed)
[facility]  Date of Admission:  10/03/2021     Principal Problem:   Cellulitis Active Problems:   Uncontrolled type 2 diabetes mellitus with hyperglycemia (HCC)   Back abscess   AKI (acute kidney injury) (HCC)          Assessment: 60 YM with uncontrolled DM, Hx of left  foot MRSA infection SP BKA admitted for back abscess. He recived once dose of dalbavancin on 10/03/21. Would had increased drainage int the interim , taken to OR on 10/06/21 for debridement and Cx+ MRSA.   #Back abscess SP I&D 10/06/21 with Cx + MRSA #Uncontrolled DM -MRI showed left upper back cellulitis without abscess, cental wound with possible necrotic tissue and  packing material. Plan to consult general surgery per concern for necrotic wound -Overall erythema around wound has decreased. will add cefadroxil for additional coverage as pt reports wound is painful and imaging shows persistent cellulitis.  Recommendations: -Continue doxycyline and ADD cefadroxil tp treat for  2 weeks(additional to dalbavancin administered on 1/3) for skin soft tissue infection (EOT 10/22/21). If wound is debrided would treat with doxy + cefadroxil 2 weeks from debridement.  -Follow-up in ID clinic following discharge  Microbiology:   Antibiotics: Dalbavancin 1/3 Pip-tazo 1/6-1/8  Doxycycline 1//10  Cultures: Blood 1/3 NGTD OR Cx 10/06/21: MRSA   SUBJECTIVE: Resting in bed. No new complaints. No significant overnight events.  Interval: Afebrile overnight.  Review of Systems: ROS   Scheduled Meds:  acetaminophen  1,000 mg Oral Q6H   amLODipine  5 mg Oral Daily   aspirin  81 mg Oral Daily   atorvastatin  10 mg Oral Daily   doxycycline  100 mg Oral Q12H   DULoxetine  30 mg Oral Daily   enoxaparin (LOVENOX) injection  40 mg Subcutaneous Q24H   gabapentin  400 mg Oral TID   insulin aspart  0-20 Units Subcutaneous TID WC   insulin aspart  0-5 Units Subcutaneous QHS   insulin aspart  protamine- aspart  18 Units Subcutaneous BID WC   metoprolol tartrate  12.5 mg Oral BID   pantoprazole  40 mg Oral Daily   pneumococcal 23 valent vaccine  0.5 mL Intramuscular Tomorrow-1000   polyethylene glycol  17 g Oral BID   senna-docusate  1 tablet Oral BID   sodium chloride flush  10-40 mL Intracatheter Q12H   sodium chloride flush  3 mL Intravenous Q12H   Continuous Infusions: PRN Meds:.gadobutrol, HYDROmorphone (DILAUDID) injection, ondansetron (ZOFRAN) IV, oxyCODONE, senna-docusate, sodium chloride flush No Known Allergies  OBJECTIVE: Vitals:   10/11/21 0842 10/11/21 1159 10/11/21 1613 10/11/21 1925  BP: (!) 169/86 127/65 (!) 147/81 (!) 159/76  Pulse: 72 69 69 70  Resp: 17 16 18 13   Temp: 98.2 F (36.8 C) 97.9 F (36.6 C) 98.3 F (36.8 C) 98.1 F (36.7 C)  TempSrc: Axillary Oral Oral Oral  SpO2: (!) 88% 93% 93% 95%  Weight:      Height:       Body mass index is 26.96 kg/m.  Physical Exam Constitutional:      General: He is not in acute distress.    Appearance: He is normal weight. He is not toxic-appearing.  HENT:     Head: Normocephalic and atraumatic.     Right Ear: External ear normal.     Left Ear: External ear normal.     Nose: No congestion or rhinorrhea.     Mouth/Throat:  Mouth: Mucous membranes are moist.     Pharynx: Oropharynx is clear.  Eyes:     Extraocular Movements: Extraocular movements intact.     Conjunctiva/sclera: Conjunctivae normal.     Pupils: Pupils are equal, round, and reactive to light.  Cardiovascular:     Rate and Rhythm: Normal rate and regular rhythm.     Heart sounds: No murmur heard.   No friction rub. No gallop.  Pulmonary:     Effort: Pulmonary effort is normal.     Breath sounds: Normal breath sounds.  Abdominal:     General: Abdomen is flat. Bowel sounds are normal.     Palpations: Abdomen is soft.  Musculoskeletal:        General: No swelling. Normal range of motion.     Cervical back: Normal range of motion  and neck supple.  Skin:    General: Skin is warm and dry.     Comments: Left upper back wound with packing and improved surrounding erythema  Neurological:     General: No focal deficit present.     Mental Status: He is oriented to person, place, and time.  Psychiatric:        Mood and Affect: Mood normal.      Lab Results Lab Results  Component Value Date   WBC 6.7 10/10/2021   HGB 10.6 (L) 10/10/2021   HCT 33.9 (L) 10/10/2021   MCV 86.0 10/10/2021   PLT 434 (H) 10/10/2021    Lab Results  Component Value Date   CREATININE 1.05 10/10/2021   BUN 20 10/10/2021   NA 133 (L) 10/10/2021   K 4.4 10/10/2021   CL 95 (L) 10/10/2021   CO2 28 10/10/2021    Lab Results  Component Value Date   ALT 11 10/03/2021   AST 14 (L) 10/03/2021   ALKPHOS 108 10/03/2021   BILITOT 1.1 10/03/2021        Danelle Earthly, MD [facility] 10/11/2021, 10:09 PM

## 2021-10-11 NOTE — Progress Notes (Signed)
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  The patient reported noticing some pain and swelling near the left scapula approximately 2 days prior to his presentation.  He thought he might have been bit by a spider.  He was going to be discharged home but developed fever and tachycardia.   Assessment & Plan:   Principal Problem:   Cellulitis Active Problems:   Uncontrolled type 2 diabetes mellitus with hyperglycemia (HCC)   Back abscess   AKI (acute kidney injury) ([place])   1-Abscess and cellulitis of the back near the left scapula, Sepsis; -Patient had I&D performed in the ED.  He received 1 dose of IV dalbavancin. Subsequently was admitted by [facility]. -Patient underwent excision and debridement of left upper back subcutaneous abscess and phlegmon in the OR on 1/6 -Wound growing staph -Started on Doxy by ID for 2 weeks.  -follow Sx recommendations. Continue with wound care.  -management per general surgery, MRI done, does show some necrotic tissue, will discuss with general surgery if this need further debridement.  2-insulin-dependent type 2 diabetes, uncontrolled with hyperglycemia A1c 12.2 -Patient reported that he is not able to afford insulin only taking metformin -Was a started on 7030--remains poorly controlled, will increase to 18 units twice daily -Continue with sliding scale insulin  Peripheral neuropathy: Need better diabetes control  AKI from dehydration and sepsis Continue to hold Cozaar Continue with hydration Cr peak 1.6 Cr down to 1.4---1.0  Hyponatremia: continue with IV fluids.   HTN: Continue to hold Cozaar due to  AKI Start Norvasc 1/10.  PAD: Continue with aspirin and statins Right leg ABI noncompressible artery Will need follow-up with vascular surgery  History of left BKA by Dr. 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  Constipation: KUB showed constipation Continue with laxative  Depression: Continue with Cymbalta  FFT, poor social support.  PT consulted Needs SNF placement.     Estimated body mass index is 26.96 kg/m as calculated from the following:   Height as of this encounter: 6\' 2"  (1.88 m).   Weight as of this encounter: 95.3 kg.   DVT prophylaxis: Lovenox Code Status: Full code Family Communication: care discussed with patient Disposition Plan:  Status is: Inpatient  Remains inpatient appropriate because Back abscess infection  Needs SNF, D/C when clear by Surgery.        Consultants:  General surgery   Procedures:  I and D 1-6  Antimicrobials:    Subjective:  Still complains of pain at the surgical site, no abdominal pain.    Objective: Vitals:   10/10/21 1714 10/10/21 2131 10/11/21 0420 10/11/21 0842  BP: (!) 159/88 (!) 178/98 140/85 (!) 169/86  Pulse: 69 73 69 72  Resp: 13 16 16 17   Temp: 97.6 F (36.4 C) 97.6 F (36.4 C) 97.8 F (36.6 C) 98.2 F (36.8 C)  TempSrc: Oral Oral Oral Axillary  SpO2: 95% 93% 98% (!) 88%  Weight:      Height:        Intake/Output Summary (Last 24 hours) at 10/11/2021 1157 Last data filed at 10/11/2021 0422 Gross per 24 hour  Intake 480 ml  Output 1150 ml  Net -670 ml  Filed Weights   10/03/21 1317 10/06/21 1547  Weight: 95.3 kg 95.3 kg    Examination:  Awake Alert, Oriented X 3, No new F.N deficits, Normal affect Symmetrical Chest wall movement, Good air movement bilaterally, CTAB RRR,No Gallops,Rubs or new Murmurs, No Parasternal Heave +ve B.Sounds, Abd Soft, No tenderness, No rebound - guarding or rigidity. No Cyanosis, status post left BKA    Data Reviewed: I have  personally reviewed following labs and imaging studies  CBC: Recent Labs  Lab 10/06/21 0158 10/06/21 1617 10/07/21 0027 10/08/21 0108 10/09/21 0147 10/10/21 0103  WBC 12.8*  --  13.5* 7.4 6.6 6.7  NEUTROABS  --   --  11.1*  --   --   --   HGB 10.6* 12.6* 11.0* 10.6* 10.9* 10.6*  HCT 32.9* 37.0* 34.1* 34.4* 35.9* 33.9*  MCV 84.6  --  85.3 88.0 88.4 86.0  PLT 312  --  345 408* 422* [id number]*   Basic Metabolic Panel: Recent Labs  Lab 10/05/21 0135 10/06/21 0158 10/06/21 1617 10/07/21 0027 10/08/21 0108 10/09/21 0147 10/10/21 1042  NA 130* 129* 134* 133* 135 132* 133*  K 3.6 3.8 4.1 4.3 4.8 4.6 4.4  CL 97* 96* 98 100 97* 96* 95*  CO2 22 24  --  25 29 28 28   GLUCOSE 212* 168* 117* 332* 253* 295* 178*  BUN 34* 28* 22* 25* 32* 30* 20  CREATININE 1.65* 1.04 0.80 1.18 1.68* 1.42* 1.05  CALCIUM 8.5* 8.3*  --  8.4* 8.6* 8.6* 8.9  MG 2.0  --   --   --   --   --   --    GFR: Estimated Creatinine Clearance: 89.2 mL/min (by C-G formula based on SCr of 1.05 mg/dL). Liver Function Tests: No results for input(s): AST, ALT, ALKPHOS, BILITOT, PROT, ALBUMIN in the last 168 hours.  No results for input(s): LIPASE, AMYLASE in the last 168 hours. No results for input(s): AMMONIA in the last 168 hours. Coagulation Profile: No results for input(s): INR, PROTIME in the last 168 hours.  Cardiac Enzymes: No results for input(s): CKTOTAL, CKMB, CKMBINDEX, TROPONINI in the last 168 hours. BNP (last 3 results) No results for input(s): PROBNP in the last 8760 hours. HbA1C: No results for input(s): HGBA1C in the last 72 hours. CBG: Recent Labs  Lab 10/10/21 0933 10/10/21 1143 10/10/21 1712 10/10/21 2135 10/11/21 0846  GLUCAP 192* 146* 220* 144* 186*   Lipid Profile: No results for input(s): CHOL, HDL, LDLCALC, TRIG, CHOLHDL, LDLDIRECT in the last 72 hours. Thyroid Function Tests: No results for input(s): TSH, T4TOTAL, FREET4, T3FREE, THYROIDAB in the last 72 hours. Anemia Panel: No  results for input(s): VITAMINB12, FOLATE, FERRITIN, TIBC, IRON, RETICCTPCT in the last 72 hours. Sepsis Labs: No results for input(s): PROCALCITON, LATICACIDVEN in the last 168 hours.   Recent Results (from the past 240 hour(s))  Blood culture (routine single)     Status: None   Collection Time: 10/03/21  1:49 PM   Specimen: Site Not Specified; Blood  Result Value Ref Range Status   Specimen Description SITE NOT SPECIFIED  Final   Special Requests   Final    BOTTLES DRAWN AEROBIC AND ANAEROBIC Blood Culture adequate volume   Culture   Final    NO GROWTH 5 DAYS Performed at [facility], [address], [place], [place] [address]    Report Status 10/08/2021 FINAL  Final  Resp Panel by RT-PCR (Flu A&B, Covid) Nasopharyngeal Swab  Status: None   Collection Time: 10/04/21 12:41 AM   Specimen: Nasopharyngeal Swab; Nasopharyngeal(NP) swabs in vial transport medium  Result Value Ref Range Status   SARS Coronavirus 2 by RT PCR NEGATIVE NEGATIVE Final    Comment: (NOTE) SARS-CoV-2 target nucleic acids are NOT DETECTED.  The SARS-CoV-2 RNA is generally detectable in upper respiratory specimens during the acute phase of infection. The lowest concentration of SARS-CoV-2 viral copies this assay can detect is 138 copies/mL. A negative result does not preclude SARS-Cov-2 infection and should not be used as the sole basis for treatment or other patient management decisions. A negative result may occur with  improper specimen collection/handling, submission of specimen other than nasopharyngeal swab, presence of viral mutation(s) within the areas targeted by this assay, and inadequate number of viral copies(<138 copies/mL). A negative result must be combined with clinical observations, patient history, and epidemiological information. The expected result is Negative.  Fact Sheet for Patients:  EntrepreneurPulse.com.au  Fact Sheet for Healthcare Providers:   IncredibleEmployment.be  This test is no t yet approved or cleared by the [place] FDA and  has been authorized for detection and/or diagnosis of SARS-CoV-2 by FDA under an Emergency Use Authorization (EUA). This EUA will remain  in effect (meaning this test can be used) for the duration of the COVID-19 declaration under Section 564(b)(1) of the Act, 21 U.S.C.section 360bbb-3(b)(1), unless the authorization is terminated  or revoked sooner.       Influenza A by PCR NEGATIVE NEGATIVE Final   Influenza B by PCR NEGATIVE NEGATIVE Final    Comment: (NOTE) The Xpert Xpress SARS-CoV-2/FLU/RSV plus assay is intended as an aid in the diagnosis of influenza from Nasopharyngeal swab specimens and should not be used as a sole basis for treatment. Nasal washings and aspirates are unacceptable for Xpert Xpress SARS-CoV-2/FLU/RSV testing.  Fact Sheet for Patients: EntrepreneurPulse.com.au  Fact Sheet for Healthcare Providers: IncredibleEmployment.be  This test is not yet approved or cleared by the [place] FDA and has been authorized for detection and/or diagnosis of SARS-CoV-2 by FDA under an Emergency Use Authorization (EUA). This EUA will remain in effect (meaning this test can be used) for the duration of the COVID-19 declaration under Section 564(b)(1) of the Act, 21 U.S.C. section 360bbb-3(b)(1), unless the authorization is terminated or revoked.  Performed at [facility], [place] [address], [place], [place] [address]   MRSA Next Gen by PCR, Nasal     Status: None   Collection Time: 10/04/21 10:41 PM   Specimen: Nasal Mucosa; Nasal Swab  Result Value Ref Range Status   MRSA by PCR Next Gen NOT DETECTED NOT DETECTED Final    Comment: (NOTE) The GeneXpert MRSA Assay (FDA approved for NASAL specimens only), is one component of a comprehensive MRSA colonization surveillance program. It is not intended to diagnose  MRSA infection nor to guide or monitor treatment for MRSA infections. Test performance is not FDA approved in patients less than 16 years old. Performed at [facility], [place] [address] [place], [place] [address]   Aerobic/Anaerobic Culture w Gram Stain (surgical/deep wound)     Status: None (Preliminary result)   Collection Time: 10/06/21  4:57 PM   Specimen: PATH Other; Tissue  Result Value Ref Range Status   Specimen Description ABSCESS  Final   Special Requests BACK ABCESS DRAINAGE SPEC A  Final   Gram Stain   Final    RARE WBC PRESENT, PREDOMINANTLY MONONUCLEAR RARE GRAM POSITIVE COCCI IN  PAIRS Performed at [facility], [place] [address] [place], [place] [address]    Culture   Final    MODERATE STAPHYLOCOCCUS AUREUS SUSCEPTIBILITIES PERFORMED ON PREVIOUS CULTURE WITHIN THE LAST 5 DAYS. NO ANAEROBES ISOLATED; CULTURE IN PROGRESS FOR 5 DAYS    Report Status PENDING  Incomplete  Aerobic/Anaerobic Culture w Gram Stain (surgical/deep wound)     Status: None (Preliminary result)   Collection Time: 10/06/21  4:59 PM   Specimen: PATH Other; Tissue  Result Value Ref Range Status   Specimen Description ABSCESS  Final   Special Requests BACK ABCESS TISSUE  Final   Gram Stain   Final    RARE WBC PRESENT,BOTH PMN AND MONONUCLEAR RARE GRAM POSITIVE COCCI Performed at [facility], [address], [place], [place] [address]    Culture   Final    MODERATE METHICILLIN RESISTANT STAPHYLOCOCCUS AUREUS NO ANAEROBES ISOLATED; CULTURE IN PROGRESS FOR 5 DAYS    Report Status PENDING  Incomplete   Organism ID, Bacteria METHICILLIN RESISTANT STAPHYLOCOCCUS AUREUS  Final      Susceptibility   Methicillin resistant staphylococcus aureus - MIC*    CIPROFLOXACIN <=0.5 SENSITIVE Sensitive     ERYTHROMYCIN >=8 RESISTANT Resistant     GENTAMICIN <=0.5 SENSITIVE Sensitive     OXACILLIN >=4 RESISTANT Resistant     TETRACYCLINE <=1 SENSITIVE Sensitive     VANCOMYCIN 1 SENSITIVE  Sensitive     TRIMETH/SULFA <=10 SENSITIVE Sensitive     CLINDAMYCIN <=0.25 SENSITIVE Sensitive     RIFAMPIN <=0.5 SENSITIVE Sensitive     Inducible Clindamycin NEGATIVE Sensitive     * MODERATE METHICILLIN RESISTANT STAPHYLOCOCCUS AUREUS         Radiology Studies: No results found.      Scheduled Meds:  acetaminophen  1,000 mg Oral Q6H   amLODipine  5 mg Oral Daily   aspirin  81 mg Oral Daily   atorvastatin  10 mg Oral Daily   doxycycline  100 mg Oral Q12H   DULoxetine  30 mg Oral Daily   enoxaparin (LOVENOX) injection  40 mg Subcutaneous Q24H   gabapentin  400 mg Oral TID   insulin aspart  0-20 Units Subcutaneous TID WC   insulin aspart  0-5 Units Subcutaneous QHS   insulin aspart protamine- aspart  18 Units Subcutaneous BID WC   metoprolol tartrate  12.5 mg Oral BID   pantoprazole  40 mg Oral Daily   pneumococcal 23 valent vaccine  0.5 mL Intramuscular Tomorrow-1000   polyethylene glycol  17 g Oral BID   senna-docusate  1 tablet Oral BID   sodium chloride flush  10-40 mL Intracatheter Q12H   sodium chloride flush  3 mL Intravenous Q12H   Continuous Infusions:     LOS: 8 days       Phillips Climes, MD [facility]   If 7PM-7AM, please contact night-coverage [email or web address]  10/11/2021, 11:57 AM  Patient ID: Brett White, male   DOB: 1961-07-31, 61 y.o.   MRN: [id number]

## 2021-10-11 NOTE — Plan of Care (Signed)

## 2021-10-11 NOTE — Progress Notes (Signed)
Physical Therapy Treatment Patient Details Name: Brett White MRN: [id number] DOB: 09-11-1961 Today's Date: 10/11/2021   History of Present Illness 61 y.o. male presents to [facility] hospital on 10/03/2021 with progressive pain and swelling near left scapula, admitted for management of cellulitis. Pt underwent L shoulder I&D abscess. Repeat I&D 10/06/21; 1/9 MRI shoulder pending  Pt also with hyponatremia. PMH includes DMII, HTN, L BKA.    PT Comments    Patient continues to be limited by 10/10 left scapular pain due to abscess. Was able to progress to walking with RW x 20 ft, however afterwards reported being in too much pain to do exercises. Reviewed exercises he can do on his own in supine and sitting EOB. (Pt reports he will not try to get up or stand up on his own, but would like to be able to sit on the side of the bed from time to time. Bed alarm set to allow this.).     Recommendations for follow up therapy are one component of a multi-disciplinary discharge planning process, led by the attending physician.  Recommendations may be updated based on patient status, additional functional criteria and insurance authorization.  Follow Up Recommendations  Skilled nursing-short term rehab (<3 hours/day)     Assistance Recommended at Discharge Intermittent Supervision/Assistance  Patient can return home with the following A little help with walking and/or transfers;A little help with bathing/dressing/bathroom;Assistance with cooking/housework;Assist for transportation;Help with stairs or ramp for entrance;Other (comment) (help with dressing changes to left scapula)   Equipment Recommendations  None recommended by PT    Recommendations for Other Services       Precautions / Restrictions Precautions Precautions: Fall Precaution Comments: old L BKA Restrictions Weight Bearing Restrictions: No     Mobility  Bed Mobility Overal bed mobility: Needs Assistance Bed Mobility: Supine to Sit      Supine to sit: HOB elevated;Supervision     General bed mobility comments: allowed HOB elevated due to Left shoulder pain and pt trying to minimize use of LUE    Transfers Overall transfer level: Needs assistance Equipment used: Rolling walker (2 wheels) Transfers: Sit to/from Stand Sit to Stand: Min guard           General transfer comment: vc for safest hand placement with RW    Ambulation/Gait Ambulation/Gait assistance: Min guard Gait Distance (Feet): 20 Feet Assistive device: Rolling walker (2 wheels) Gait Pattern/deviations: Step-to pattern;Decreased stride length       General Gait Details: pt limited by shoulder pain   Stairs             Wheelchair Mobility    Modified Rankin (Stroke Patients Only)       Balance Overall balance assessment: Needs assistance Sitting-balance support: No upper extremity supported;Feet supported Sitting balance-Leahy Scale: Good     Standing balance support: Bilateral upper extremity supported;Reliant on assistive device for balance Standing balance-Leahy Scale: Poor                              Cognition Arousal/Alertness: Awake/alert Behavior During Therapy: WFL for tasks assessed/performed Overall Cognitive Status: Within Functional Limits for tasks assessed                                          Exercises Other Exercises Other Exercises: Discussed doing LE exercises sitting at EOB (  and placed bed alarm on setting that allows him to sit EOB). Patient familiar with exercises and states he will do them later when shoulder has settled down (incr pain due to walking with RW).    General Comments General comments (skin integrity, edema, etc.): returned to rt sidelyin to alleviate some shoulder pain after incr pain from ambulation and use of RW      Pertinent Vitals/Pain Pain Assessment: 0-10 Pain Score: 10-Worst pain ever Pain Location: L scapula Pain Descriptors / Indicators:  Stabbing;Tender;Burning Pain Intervention(s): Limited activity within patient's tolerance;Monitored during session;Premedicated before session;Repositioned    Home Living                          Prior Function            PT Goals (current goals can now be found in the care plan section) Acute Rehab PT Goals Patient Stated Goal: to reduce L shoulder discomfort and return to independence Time For Goal Achievement: 10/19/21 Potential to Achieve Goals: Good Progress towards PT goals: Progressing toward goals    Frequency    Min 3X/week      PT Plan Current plan remains appropriate    Co-evaluation              AM-PAC PT "6 Clicks" Mobility   Outcome Measure  Help needed turning from your back to your side while in a flat bed without using bedrails?: A Little Help needed moving from lying on your back to sitting on the side of a flat bed without using bedrails?: A Little Help needed moving to and from a bed to a chair (including a wheelchair)?: A Little Help needed standing up from a chair using your arms (e.g., wheelchair or bedside chair)?: A Little Help needed to walk in hospital room?: Total Help needed climbing 3-5 steps with a railing? : Total 6 Click Score: 14    End of Session   Activity Tolerance: Patient limited by pain Patient left: with call bell/phone within reach;in bed;with bed alarm set Nurse Communication: Other (comment);Mobility status (EOB allowed for exercises) PT Visit Diagnosis: Other abnormalities of gait and mobility (R26.89);Muscle weakness (generalized) (M62.81);Pain Pain - Right/Left: Left Pain - part of body: Shoulder     Time: [id number] PT Time Calculation (min) (ACUTE ONLY): 16 min  Charges:  $Gait Training: 8-22 mins                      Arby Barrette, PT Acute Rehabilitation Services  Pager [phone] Office [phone]    Rexanne Mano 10/11/2021, 11:26 AM

## 2021-10-12 DIAGNOSIS — L02212 Cutaneous abscess of back [any part, except buttock]: Secondary | ICD-10-CM | POA: Diagnosis not present

## 2021-10-12 DIAGNOSIS — E1165 Type 2 diabetes mellitus with hyperglycemia: Secondary | ICD-10-CM | POA: Diagnosis not present

## 2021-10-12 DIAGNOSIS — N179 Acute kidney failure, unspecified: Secondary | ICD-10-CM | POA: Diagnosis not present

## 2021-10-12 LAB — CBC
HCT: 34.7 % — ABNORMAL LOW (ref 39.0–52.0)
Hemoglobin: 11.2 g/dL — ABNORMAL LOW (ref 13.0–17.0)
MCH: 27.4 pg (ref 26.0–34.0)
MCHC: 32.3 g/dL (ref 30.0–36.0)
MCV: 84.8 fL (ref 80.0–100.0)
Platelets: 455 10*3/uL — ABNORMAL HIGH (ref 150–400)
RBC: 4.09 MIL/uL — ABNORMAL LOW (ref 4.22–5.81)
RDW: 12.4 % (ref 11.5–15.5)
WBC: 7.1 10*3/uL (ref 4.0–10.5)
nRBC: 0 % (ref 0.0–0.2)

## 2021-10-12 LAB — BASIC METABOLIC PANEL
Anion gap: 7 (ref 5–15)
BUN: 23 mg/dL — ABNORMAL HIGH (ref 6–20)
CO2: 30 mmol/L (ref 22–32)
Calcium: 8.6 mg/dL — ABNORMAL LOW (ref 8.9–10.3)
Chloride: 97 mmol/L — ABNORMAL LOW (ref 98–111)
Creatinine, Ser: 1.09 mg/dL (ref 0.61–1.24)
GFR, Estimated: >60 mL/min (ref >60)
Glucose, Bld: 257 mg/dL — ABNORMAL HIGH (ref 70–99)
Potassium: 4.3 mmol/L (ref 3.5–5.1)
Sodium: 134 mmol/L — ABNORMAL LOW (ref 135–145)

## 2021-10-12 LAB — GLUCOSE, CAPILLARY
Glucose-Capillary: 190 mg/dL — ABNORMAL HIGH (ref 70–99)
Glucose-Capillary: 209 mg/dL — ABNORMAL HIGH (ref 70–99)
Glucose-Capillary: 243 mg/dL — ABNORMAL HIGH (ref 70–99)
Glucose-Capillary: 136 mg/dL — ABNORMAL HIGH (ref 70–99)

## 2021-10-12 MED ORDER — HYDROMORPHONE HCL 1 MG/ML IJ SOLN
0.5000 mg | INTRAMUSCULAR | Status: DC | PRN
  Administered 2021-10-12: 1 mg via INTRAVENOUS
  Administered 2021-10-13: 0.5 mg via INTRAVENOUS
  Administered 2021-10-13: 1 mg via INTRAVENOUS
  Administered 2021-10-13: 0.5 mg via INTRAVENOUS
  Administered 2021-10-13 – 2021-10-14 (×3): 1 mg via INTRAVENOUS
  Administered 2021-10-14: 0.5 mg via INTRAVENOUS
  Administered 2021-10-14: 1 mg via INTRAVENOUS
  Administered 2021-10-14: 0.5 mg via INTRAVENOUS
  Administered 2021-10-15 – 2021-10-19 (×22): 1 mg via INTRAVENOUS
  Filled 2021-10-12 (×10): qty 1
  Filled 2021-10-12: qty 0.5
  Filled 2021-10-12 (×9): qty 1
  Filled 2021-10-12: qty 0.5
  Filled 2021-10-12 (×2): qty 1
  Filled 2021-10-12: qty 0.5
  Filled 2021-10-12 (×2): qty 1
  Filled 2021-10-12: qty 0.5
  Filled 2021-10-12 (×5): qty 1

## 2021-10-12 MED ORDER — HYDRALAZINE HCL 20 MG/ML IJ SOLN: 10.0000 mg | Freq: Four times a day (QID) | INTRAMUSCULAR | Status: DC | PRN

## 2021-10-12 NOTE — Progress Notes (Signed)
Patient ID: Brett White, male   DOB: Dec 25, 1960, 61 y.o.   MRN: [id number] [facility]  6 Days Post-Op  Subjective: CC-  Continues to have a lot of pain at surgical site. WBC WNL, afebrile.  Objective: Vital signs in last 24 hours: Temp:  [97.7 F (36.5 C)-98.3 F (36.8 C)] 98.1 F (36.7 C) (01/12 0813) Pulse Rate:  [66-75] 66 (01/12 0813) Resp:  [8-18] 15 (01/12 0813) BP: (127-170)/(65-98) 137/79 (01/12 0813) SpO2:  [93 %-97 %] 97 % (01/12 0813) Last BM Date: 10/11/21  Intake/Output from previous day: 01/11 0701 - 01/12 0700 In: -  Out: 600 [Urine:600] Intake/Output this shift: No intake/output data recorded.  PE: Gen: NAD Resp: nonlabored Back: open wound with some fibrinous debris - stable, no bleeding, surrounding skin still with unchanged erythema and induration, no purulent drainage noted.  Seems a bit hypersensitive to touch  Lab Results:  Recent Labs    10/10/21 0103 10/12/21 0117  WBC 6.7 7.1  HGB 10.6* 11.2*  HCT 33.9* 34.7*  PLT 434* 455*   BMET Recent Labs    10/10/21 1042 10/12/21 0117  NA 133* 134*  K 4.4 4.3  CL 95* 97*  CO2 28 30  GLUCOSE 178* 257*  BUN 20 23*  CREATININE 1.05 1.09  CALCIUM 8.9 8.6*   PT/INR No results for input(s): LABPROT, INR in the last 72 hours. CMP     Component Value Date/Time   NA 134 (L) 10/12/2021 0117   K 4.3 10/12/2021 0117   CL 97 (L) 10/12/2021 0117   CO2 30 10/12/2021 0117   GLUCOSE 257 (H) 10/12/2021 0117   BUN 23 (H) 10/12/2021 0117   CREATININE 1.09 10/12/2021 0117   CALCIUM 8.6 (L) 10/12/2021 0117   PROT 7.6 10/03/2021 1349   ALBUMIN 3.1 (L) 10/03/2021 1349   AST 14 (L) 10/03/2021 1349   ALT 11 10/03/2021 1349   ALKPHOS 108 10/03/2021 1349   BILITOT 1.1 10/03/2021 1349   GFRNONAA >60 10/12/2021 0117   Lipase     Component Value Date/Time   LIPASE 22 02/21/2021 2134       Studies/Results: No results found.  Anti-infectives: Anti-infectives (From  admission, onward)    Start     Dose/Rate Route Frequency Ordered Stop   10/11/21 2315  cefadroxil (DURICEF) capsule 500 mg        500 mg Oral 2 times daily 10/11/21 2225 10/22/21 2359   10/10/21 1000  doxycycline (VIBRA-TABS) tablet 100 mg        100 mg Oral Every 12 hours 10/10/21 0909 10/22/21 2359   10/08/21 1400  piperacillin-tazobactam (ZOSYN) IVPB 3.375 g        3.375 g 12.5 mL/hr over 240 Minutes Intravenous Every 8 hours 10/08/21 0918 10/08/21 1623   10/07/21 1015  piperacillin-tazobactam (ZOSYN) IVPB 3.375 g        3.375 g 12.5 mL/hr over 240 Minutes Intravenous On call to O.R. 10/07/21 0919 10/07/21 1555   10/06/21 1145  piperacillin-tazobactam (ZOSYN) IVPB 3.375 g        3.375 g 12.5 mL/hr over 240 Minutes Intravenous On call to O.R. 10/06/21 1046 10/06/21 1644   10/03/21 1745  dalbavancin (DALVANCE) 1,500 mg in dextrose 5 % 500 mL IVPB        1,500 mg 967.7 mL/hr over 31 Minutes Intravenous Once 10/03/21 1736 10/03/21 2234        Assessment/Plan POD#6, s/p Excision and debridement of left upper back subcutaneous abscess and  phlegmon 1/6 Dr. Kae Heller - Culture MODERATE MRSA - MRI of the shoulder shows no further abscess, extensive cellulitis ~22 x 12 cm area  - WBC WNL, afebrile - Wound is stable and does not need further debridement. Continue BID wet to dry dressing changes. Shower with wound open. Antibiotics per ID.  [place] for discharge to SNF from surgical standpoint. I will put discharge instructions and follow up info on AVS. We will sign off, please call with questions or concerns.   FEN - carb mod diet VTE - lovenox ID - dalbavancin 1/3, zosyn 1/6 >>1/8, doxy 1/10>>, cefadroxil 1/11>>   Uncontrolled IDDM HTN PAD Hx L BKA Depression   Straightforward Medical Decision Making   LOS: 9 days    Wellington Hampshire, [facility] 10/12/2021, 9:54 AM Please see Amion for pager number during day hours 7:00am-4:30pm

## 2021-10-12 NOTE — TOC Progression Note (Signed)
Transition of Care [facility]) - Progression Note    Patient Details  Name: Brett White MRN: [id number] Date of Birth: 1961/07/25  [facility] CM/SW Contact  Mearl Latin, LCSW Phone Number: 10/12/2021, 12:56 PM  Clinical Narrative:    [facility] has private room for patient (only one that does of the bed offers). Patient was going to go there previously but [place] was unable to obtain insurance approval for him to admit. CSW requested [place] start insurance authorization process.    Expected Discharge Plan: Skilled Nursing Facility Barriers to Discharge: SNF Pending bed offer, Continued Medical Work up, English as a second language teacher  Expected Discharge Plan and Services Expected Discharge Plan: Skilled Nursing Facility In-house Referral: Clinical Social Work   Post Acute Care Choice: Skilled Nursing Facility Living arrangements for the past 2 months: Apartment                                       Social Determinants of Health (SDOH) Interventions    Readmission Risk Interventions No flowsheet data found.

## 2021-10-12 NOTE — Progress Notes (Signed)
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  The patient reported noticing some pain and swelling near the left scapula approximately 2 days prior to his presentation.  He thought he might have been bit by a spider.  He was going to be discharged home but developed fever and tachycardia.   Assessment & Plan:   Principal Problem:   Cellulitis Active Problems:   Uncontrolled type 2 diabetes mellitus with hyperglycemia (HCC)   Back abscess   AKI (acute kidney injury) ([place])   1-Abscess and cellulitis of the back near the left scapula, Sepsis; -Patient had I&D performed in the ED.  He received 1 dose of IV dalbavancin. Subsequently was admitted by [facility]. -Patient underwent excision and debridement of left upper back subcutaneous abscess and phlegmon in the OR on 1/6 -Wound growing staph -Antibiotics management per ID, initially on doxycycline, but given extensive cellulitis cefadroxil has been added, for 2 weeks treatment, continue with antibiotic treatment until 10/22/2021 -Input greatly appreciated, recommendation for local wound care and dressing.  No need for further debridement.  2-insulin-dependent type 2 diabetes, uncontrolled with hyperglycemia A1c 12.2 -Patient reported that he is not able to afford insulin only taking metformin -Was a started on 70/30, better controlled after increasing to 18 units twice daily -Continue with sliding scale insulin  Peripheral neuropathy: Need better diabetes control  AKI from dehydration and sepsis Continue to hold Cozaar Continue with hydration Cr peak 1.6 Cr down to 1.4---1.0  Hyponatremia: Improved  with IV fluids.  HTN: Continue to hold Cozaar due to AKI Start Norvasc 1/10.  PAD: Continue with aspirin and statins Right leg ABI noncompressible artery Will need follow-up with vascular surgery  History of left BKA by Dr. 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  Constipation: KUB showed constipation Continue with laxative  Depression: Continue with Cymbalta  FFT, poor social support.  PT consulted Needs SNF placement.     Estimated body mass index is 26.96 kg/m as calculated from the following:   Height as of this encounter: 6\' 2"  (1.88 m).   Weight as of this encounter: 95.3 kg.   DVT prophylaxis: Lovenox Code Status: Full code Family Communication: care discussed with patient, none at bedside Disposition Plan:  Status is: Inpatient  Remains inpatient appropriate because Back abscess infection  Needs SNF, D/C when clear by Surgery.        Consultants:  General surgery  Infectious disease  Procedures:  I and D 1-6  Antimicrobials:    Subjective:  Patient reports she is feeling better today, still complaining of pain in the left shoulder area, but it is improved.    Objective: Vitals:   10/12/21 0358 10/12/21 0527 10/12/21 0813 10/12/21 1112  BP: (!) 170/98 (!) 147/81 137/79 115/65  Pulse: 72  66 61  Resp: (!) 8  15 17   Temp: 97.7 F (36.5 C)  98.1 F (36.7 C) 98.3 F (36.8 C)  TempSrc: Oral  Oral Oral  SpO2: 96%  97% 96%  Weight:      Height:        Intake/Output Summary (Last 24 hours) at 10/12/2021 1257 Last data filed at 10/12/2021 0100 Gross per 24 hour  Intake --  Output 600 ml  Net -600 ml   Filed Weights   10/03/21 1317 10/06/21 1547  Weight: 95.3 kg 95.3 kg    Examination:  Awake Alert, Oriented X 3, No new F.N deficits, Normal affect Symmetrical Chest wall movement, Good air movement bilaterally, CTAB RRR,No Gallops,Rubs or new Murmurs, No Parasternal Heave +ve B.Sounds, Abd Soft, No tenderness,  No rebound - guarding or rigidity. No Cyanosis, left BKA Left shoulder wound bandaged    Data Reviewed: I have personally reviewed following labs and imaging studies  CBC: Recent Labs  Lab 10/07/21 0027 10/08/21 0108 10/09/21 0147 10/10/21 0103 10/12/21 0117  WBC 13.5* 7.4 6.6 6.7 7.1  NEUTROABS 11.1*  --   --   --   --   HGB 11.0* 10.6* 10.9* 10.6* 11.2*  HCT 34.1* 34.4* 35.9* 33.9* 34.7*  MCV 85.3 88.0 88.4 86.0 84.8  PLT 345 408* 422* 434* [id number]*   Basic Metabolic Panel: Recent Labs  Lab 10/07/21 0027 10/08/21 0108 10/09/21 0147 10/10/21 1042 10/12/21 0117  NA 133* 135 132* 133* 134*  K 4.3 4.8 4.6 4.4 4.3  CL 100 97* 96* 95* 97*  CO2 25 29 28 28 30   GLUCOSE 332* 253* 295* 178* 257*  BUN 25* 32* 30* 20 23*  CREATININE 1.18 1.68* 1.42* 1.05 1.09  CALCIUM 8.4* 8.6* 8.6* 8.9 8.6*   GFR: Estimated Creatinine Clearance: 85.9 mL/min (by C-G formula based on SCr of 1.09 mg/dL). Liver Function Tests: No results for input(s): AST, ALT, ALKPHOS, BILITOT, PROT, ALBUMIN in the last 168 hours.  No results for input(s): LIPASE, AMYLASE in the last 168 hours. No results for input(s): AMMONIA in the last 168 hours. Coagulation Profile: No results for input(s): INR, PROTIME in the last 168 hours.  Cardiac Enzymes: No results for input(s): CKTOTAL, CKMB, CKMBINDEX, TROPONINI in the last 168 hours. BNP (last 3 results) No results for input(s): PROBNP in the last 8760 hours. HbA1C: No results for input(s): HGBA1C in the last 72 hours. CBG: Recent Labs  Lab 10/11/21 1158 10/11/21 1611 10/11/21 2142 10/12/21 0817 10/12/21 1115  GLUCAP 213* 162* 151* 190* 209*   Lipid Profile: No results for input(s): CHOL, HDL, LDLCALC, TRIG, CHOLHDL, LDLDIRECT in the last 72 hours. Thyroid Function Tests: No results for input(s): TSH, T4TOTAL, FREET4, T3FREE, THYROIDAB in the last 72 hours. Anemia Panel: No results for input(s): VITAMINB12, FOLATE, FERRITIN, TIBC, IRON, RETICCTPCT  in the last 72 hours. Sepsis Labs: No results for input(s): PROCALCITON, LATICACIDVEN in the last 168 hours.   Recent Results (from the past 240 hour(s))  Blood culture (routine single)     Status: None   Collection Time: 10/03/21  1:49 PM   Specimen: Site Not Specified; Blood  Result Value Ref Range Status   Specimen Description SITE NOT SPECIFIED  Final   Special Requests   Final    BOTTLES DRAWN AEROBIC AND ANAEROBIC Blood Culture adequate volume   Culture   Final    NO GROWTH 5 DAYS Performed at [facility], [address] [place], [place] [address]    Report Status 10/08/2021 FINAL  Final  Resp Panel by RT-PCR (Flu A&B, Covid) Nasopharyngeal Swab     Status: None   Collection Time: 10/04/21 12:41 AM   Specimen: Nasopharyngeal Swab; Nasopharyngeal(NP) swabs in vial transport medium  Result Value Ref Range Status   SARS Coronavirus 2 by RT PCR NEGATIVE NEGATIVE Final    Comment: (NOTE) SARS-CoV-2 target nucleic acids are  NOT DETECTED.  The SARS-CoV-2 RNA is generally detectable in upper respiratory specimens during the acute phase of infection. The lowest concentration of SARS-CoV-2 viral copies this assay can detect is 138 copies/mL. A negative result does not preclude SARS-Cov-2 infection and should not be used as the sole basis for treatment or other patient management decisions. A negative result may occur with  improper specimen collection/handling, submission of specimen other than nasopharyngeal swab, presence of viral mutation(s) within the areas targeted by this assay, and inadequate number of viral copies(<138 copies/mL). A negative result must be combined with clinical observations, patient history, and epidemiological information. The expected result is Negative.  Fact Sheet for Patients:  BloggerCourse.com  Fact Sheet for Healthcare Providers:  SeriousBroker.it  This test is no t yet approved or  cleared by the [place] FDA and  has been authorized for detection and/or diagnosis of SARS-CoV-2 by FDA under an Emergency Use Authorization (EUA). This EUA will remain  in effect (meaning this test can be used) for the duration of the COVID-19 declaration under Section 564(b)(1) of the Act, 21 U.S.C.section 360bbb-3(b)(1), unless the authorization is terminated  or revoked sooner.       Influenza A by PCR NEGATIVE NEGATIVE Final   Influenza B by PCR NEGATIVE NEGATIVE Final    Comment: (NOTE) The Xpert Xpress SARS-CoV-2/FLU/RSV plus assay is intended as an aid in the diagnosis of influenza from Nasopharyngeal swab specimens and should not be used as a sole basis for treatment. Nasal washings and aspirates are unacceptable for Xpert Xpress SARS-CoV-2/FLU/RSV testing.  Fact Sheet for Patients: BloggerCourse.com  Fact Sheet for Healthcare Providers: SeriousBroker.it  This test is not yet approved or cleared by the [place] FDA and has been authorized for detection and/or diagnosis of SARS-CoV-2 by FDA under an Emergency Use Authorization (EUA). This EUA will remain in effect (meaning this test can be used) for the duration of the COVID-19 declaration under Section 564(b)(1) of the Act, 21 U.S.C. section 360bbb-3(b)(1), unless the authorization is terminated or revoked.  Performed at [facility], [address] [place], [place] [address]   MRSA Next Gen by PCR, Nasal     Status: None   Collection Time: 10/04/21 10:41 PM   Specimen: Nasal Mucosa; Nasal Swab  Result Value Ref Range Status   MRSA by PCR Next Gen NOT DETECTED NOT DETECTED Final    Comment: (NOTE) The GeneXpert MRSA Assay (FDA approved for NASAL specimens only), is one component of a comprehensive MRSA colonization surveillance program. It is not intended to diagnose MRSA infection nor to guide or monitor treatment for MRSA infections. Test  performance is not FDA approved in patients less than 42 years old. Performed at [facility], [address] [place], [place] [address]   Aerobic/Anaerobic Culture w Gram Stain (surgical/deep wound)     Status: None   Collection Time: 10/06/21  4:57 PM   Specimen: PATH Other; Tissue  Result Value Ref Range Status   Specimen Description ABSCESS  Final   Special Requests BACK ABCESS DRAINAGE SPEC A  Final   Gram Stain   Final    RARE WBC PRESENT, PREDOMINANTLY MONONUCLEAR RARE GRAM POSITIVE COCCI IN PAIRS    Culture   Final    MODERATE STAPHYLOCOCCUS AUREUS SUSCEPTIBILITIES PERFORMED ON PREVIOUS CULTURE WITHIN THE LAST 5 DAYS. NO ANAEROBES ISOLATED Performed at [facility], [address], [place], [place] [address]    Report Status 10/11/2021 FINAL  Final  Aerobic/Anaerobic Culture w Gram Stain (surgical/deep wound)     Status: None   Collection Time: 10/06/21  4:59 PM   Specimen: PATH Other; Tissue  Result Value Ref Range Status   Specimen Description ABSCESS  Final   Special Requests BACK ABCESS TISSUE  Final   Gram Stain   Final    RARE WBC PRESENT,BOTH PMN AND MONONUCLEAR RARE GRAM POSITIVE COCCI    Culture   Final    MODERATE METHICILLIN RESISTANT STAPHYLOCOCCUS AUREUS NO ANAEROBES ISOLATED Performed at [facility], [address], [place], [place] [address]    Report Status 10/11/2021 FINAL  Final   Organism ID, Bacteria METHICILLIN RESISTANT STAPHYLOCOCCUS AUREUS  Final      Susceptibility   Methicillin resistant staphylococcus aureus - MIC*    CIPROFLOXACIN <=0.5 SENSITIVE Sensitive     ERYTHROMYCIN >=8 RESISTANT Resistant     GENTAMICIN <=0.5 SENSITIVE Sensitive     OXACILLIN >=4 RESISTANT Resistant     TETRACYCLINE <=1 SENSITIVE Sensitive     VANCOMYCIN 1 SENSITIVE Sensitive     TRIMETH/SULFA <=10 SENSITIVE Sensitive     CLINDAMYCIN <=0.25 SENSITIVE Sensitive     RIFAMPIN <=0.5 SENSITIVE Sensitive     Inducible Clindamycin NEGATIVE  Sensitive     * MODERATE METHICILLIN RESISTANT STAPHYLOCOCCUS AUREUS         Radiology Studies: No results found.      Scheduled Meds:  acetaminophen  1,000 mg Oral Q6H   amLODipine  5 mg Oral Daily   aspirin  81 mg Oral Daily   atorvastatin  10 mg Oral Daily   cefadroxil  500 mg Oral BID   doxycycline  100 mg Oral Q12H   DULoxetine  30 mg Oral Daily   enoxaparin (LOVENOX) injection  40 mg Subcutaneous Q24H   gabapentin  400 mg Oral TID   insulin aspart  0-20 Units Subcutaneous TID WC   insulin aspart  0-5 Units Subcutaneous QHS   insulin aspart protamine- aspart  18 Units Subcutaneous BID WC   metoprolol tartrate  12.5 mg Oral BID   pantoprazole  40 mg Oral Daily   pneumococcal 23 valent vaccine  0.5 mL Intramuscular Tomorrow-1000   polyethylene glycol  17 g Oral BID   senna-docusate  1 tablet Oral BID   sodium chloride flush  10-40 mL Intracatheter Q12H   sodium chloride flush  3 mL Intravenous Q12H   Continuous Infusions:     LOS: 9 days       Phillips Climes, MD [facility]   If 7PM-7AM, please contact night-coverage [email or web address]  10/12/2021, 12:57 PM  Patient ID: Brett White, male   DOB: 1961/05/17, 61 y.o.   MRN: [id number] Patient ID: Brett White, male   DOB: Feb 26, 1961, 61 y.o.   MRN: [id number]

## 2021-10-13 DIAGNOSIS — L02212 Cutaneous abscess of back [any part, except buttock]: Secondary | ICD-10-CM | POA: Diagnosis not present

## 2021-10-13 LAB — GLUCOSE, CAPILLARY
Glucose-Capillary: 146 mg/dL — ABNORMAL HIGH (ref 70–99)
Glucose-Capillary: 230 mg/dL — ABNORMAL HIGH (ref 70–99)
Glucose-Capillary: 157 mg/dL — ABNORMAL HIGH (ref 70–99)
Glucose-Capillary: 111 mg/dL — ABNORMAL HIGH (ref 70–99)

## 2021-10-13 MED ORDER — INSULIN ASPART PROT & ASPART (70-30 MIX) 100 UNIT/ML ~~LOC~~ SUSP
20.0000 [IU] | Freq: Two times a day (BID) | SUBCUTANEOUS | Status: DC
  Administered 2021-10-13 – 2021-10-14 (×2): 20 [IU] via SUBCUTANEOUS
  Filled 2021-10-13: qty 10

## 2021-10-13 NOTE — TOC Progression Note (Signed)
Transition of Care [facility]) - Progression Note    Patient Details  Name: Brett White MRN: [id number] Date of Birth: Aug 18, 1961  Transition of Care [facility] CM/SW Contact  Mearl Latin, LCSW Phone Number: 10/13/2021, 4:47 PM  Clinical Narrative:    CSW received call from [place]. [employer] contacted them and stated they did not have a determination yet and would be closed until Tuesday.    Expected Discharge Plan: Skilled Nursing Facility Barriers to Discharge: SNF Pending bed offer, Continued Medical Work up, English as a second language teacher  Expected Discharge Plan and Services Expected Discharge Plan: Skilled Nursing Facility In-house Referral: Clinical Social Work   Post Acute Care Choice: Skilled Nursing Facility Living arrangements for the past 2 months: Apartment                                       Social Determinants of Health (SDOH) Interventions    Readmission Risk Interventions No flowsheet data found.

## 2021-10-13 NOTE — Care Management Important Message (Signed)
Important Message  Patient Details  Name: Brett White MRN: [id number] Date of Birth: Mar 15, 1961   Medicare Important Message Given:  Yes     Shelda Altes 10/13/2021, 2:06 PM

## 2021-10-13 NOTE — Progress Notes (Signed)
Inpatient Diabetes Program Recommendations  AACE/ADA: New Consensus Statement on Inpatient Glycemic Control (2015)  Target Ranges:  Prepandial:   less than 140 mg/dL      Peak postprandial:   less than 180 mg/dL (1-2 hours)      Critically ill patients:  140 - 180 mg/dL   Lab Results  Component Value Date   GLUCAP 146 (H) 10/13/2021   HGBA1C 12.2 (H) 10/04/2021    Review of Glycemic Control  Latest Reference Range & Units 10/12/21 11:15 10/12/21 17:03 10/12/21 20:48 10/13/21 07:44  Glucose-Capillary 70 - 99 mg/dL 655 (H) 374 (H) 827 (H) 146 (H)  (H): Data is abnormally high Diabetes history: DM2 Outpatient Diabetes medications: Lantus 5 units BID (not taken in 1 month), Novolog 5 units TID (not taken in 1 month), Metformin 500 mg daily Current orders for Inpatient glycemic control:  70/30 18 units bid Novolog 0-20 units tid + hs  Inpatient Diabetes Program Recommendations:    Consider increasing AM dose of Novolog 70/30 to 22 units.   Thanks, Lujean Rave, MSN, RNC-OB Diabetes Coordinator [phone] (8a-5p)

## 2021-10-13 NOTE — Progress Notes (Signed)
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  The patient reported noticing some pain and swelling near the left scapula approximately 2 days prior to his presentation.  He thought he might have been bit by a spider.  He was going to be discharged home but developed fever and tachycardia.   Assessment & Plan:   Principal Problem:   Cellulitis Active Problems:   Uncontrolled type 2 diabetes mellitus with hyperglycemia (HCC)   Back abscess   AKI (acute kidney injury) ([place])   1-Abscess and cellulitis of the back near the left scapula, Sepsis; -Patient had I&D performed in the ED.  He received 1 dose of IV dalbavancin. Subsequently was admitted by [facility]. -Patient underwent excision and debridement of left upper back subcutaneous abscess and phlegmon in the OR on 1/6 -Wound growing staph -Antibiotics management per ID, initially on doxycycline, but given extensive cellulitis cefadroxil has been added, for 2 weeks treatment, continue with antibiotic treatment until 10/22/2021 -Input greatly appreciated, recommendation for local wound care and dressing.  No need for further debridement.  2-insulin-dependent type 2 diabetes, uncontrolled with hyperglycemia A1c 12.2 -Patient reported that he is not able to afford insulin only taking metformin -Was a started on 70/30, better controlled after increasing to 18 units twice daily -Continue with sliding scale insulin  Peripheral neuropathy: Need better diabetes control  AKI from dehydration and sepsis Continue to hold Cozaar Continue with hydration Cr peak 1.6 Cr down to 1.4---1.0  Hyponatremia: Improved  with IV fluids.  HTN: Continue to hold Cozaar due to AKI Start Norvasc 1/10.  PAD: Continue with aspirin and statins Right leg ABI noncompressible artery Will need follow-up with vascular surgery  History of left BKA by Dr. 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  Constipation: KUB showed constipation Continue with laxative  Depression: Continue with Cymbalta  FFT, poor social support.  PT consulted Needs SNF placement.     Estimated body mass index is 26.96 kg/m as calculated from the following:   Height as of this encounter: 6\' 2"  (1.88 m).   Weight as of this encounter: 95.3 kg.   DVT prophylaxis: Lovenox Code Status: Full code Family Communication: care discussed with patient, none at bedside Disposition Plan:  Status is: Inpatient  Patient is medically stable for discharge, awaiting SNF bed availability/insurance authorization      Consultants:  General surgery  Infectious disease  Procedures:  I and D 1-6  Antimicrobials:    Subjective:  No significant events overnight, complaining of pain at the surgical site   Objective: Vitals:   10/12/21 1926 10/12/21 2352 10/13/21 0346 10/13/21 0744  BP: (!) 153/86 (!) 178/99 (!) 146/85 (!) 160/99  Pulse: 80 69 63 63  Resp: 15 15 15 10   Temp: 98.2 F (36.8 C) 97.8 F (36.6 C) 97.9 F (36.6 C) 97.8 F (36.6 C)  TempSrc: Oral Oral Oral Oral  SpO2: 98% 98% 98% 94%  Weight:      Height:        Intake/Output Summary (Last 24 hours) at 10/13/2021 1149 Last data filed at 10/13/2021 0800 Gross per 24 hour  Intake --  Output 1325 ml  Net -1325 ml  Filed Weights   10/03/21 1317 10/06/21 1547  Weight: 95.3 kg 95.3 kg    Examination:  Awake Alert, Oriented X 3, No new F.N deficits, Normal affect Symmetrical Chest wall movement, Good air movement bilaterally, CTAB RRR,No Gallops,Rubs or new Murmurs, No Parasternal Heave +ve B.Sounds, Abd Soft, No tenderness, No rebound -  guarding or rigidity. No Cyanosis, left BKA, left upper back bandaged     Data Reviewed: I have personally reviewed following labs and imaging studies  CBC: Recent Labs  Lab 10/07/21 0027 10/08/21 0108 10/09/21 0147 10/10/21 0103 10/12/21 0117  WBC 13.5* 7.4 6.6 6.7 7.1  NEUTROABS 11.1*  --   --   --   --   HGB 11.0* 10.6* 10.9* 10.6* 11.2*  HCT 34.1* 34.4* 35.9* 33.9* 34.7*  MCV 85.3 88.0 88.4 86.0 84.8  PLT 345 408* 422* 434* [id number]*   Basic Metabolic Panel: Recent Labs  Lab 10/07/21 0027 10/08/21 0108 10/09/21 0147 10/10/21 1042 10/12/21 0117  NA 133* 135 132* 133* 134*  K 4.3 4.8 4.6 4.4 4.3  CL 100 97* 96* 95* 97*  CO2 25 29 28 28 30   GLUCOSE 332* 253* 295* 178* 257*  BUN 25* 32* 30* 20 23*  CREATININE 1.18 1.68* 1.42* 1.05 1.09  CALCIUM 8.4* 8.6* 8.6* 8.9 8.6*   GFR: Estimated Creatinine Clearance: 85.9 mL/min (by C-G formula based on SCr of 1.09 mg/dL). Liver Function Tests: No results for input(s): AST, ALT, ALKPHOS, BILITOT, PROT, ALBUMIN in the last 168 hours.  No results for input(s): LIPASE, AMYLASE in the last 168 hours. No results for input(s): AMMONIA in the last 168 hours. Coagulation Profile: No results for input(s): INR, PROTIME in the last 168 hours.  Cardiac Enzymes: No results for input(s): CKTOTAL, CKMB, CKMBINDEX, TROPONINI in the last 168 hours. BNP (last 3 results) No results for input(s): PROBNP in the last 8760 hours. HbA1C: No results for input(s): HGBA1C in the last 72 hours. CBG: Recent Labs  Lab 10/12/21 0817 10/12/21 1115 10/12/21 1703 10/12/21 2048 10/13/21 0744  GLUCAP 190* 209* 243* 136* 146*   Lipid Profile: No results for input(s): CHOL, HDL, LDLCALC, TRIG, CHOLHDL, LDLDIRECT in the last 72 hours. Thyroid Function Tests: No results for input(s): TSH, T4TOTAL, FREET4, T3FREE, THYROIDAB in the last 72 hours. Anemia Panel: No results for input(s): VITAMINB12, FOLATE, FERRITIN, TIBC, IRON, RETICCTPCT in the last 72  hours. Sepsis Labs: No results for input(s): PROCALCITON, LATICACIDVEN in the last 168 hours.   Recent Results (from the past 240 hour(s))  Blood culture (routine single)     Status: None   Collection Time: 10/03/21  1:49 PM   Specimen: Site Not Specified; Blood  Result Value Ref Range Status   Specimen Description SITE NOT SPECIFIED  Final   Special Requests   Final    BOTTLES DRAWN AEROBIC AND ANAEROBIC Blood Culture adequate volume   Culture   Final    NO GROWTH 5 DAYS Performed at [facility], [address], [place], [place] [address]    Report Status 10/08/2021 FINAL  Final  Resp Panel by RT-PCR (Flu A&B, Covid) Nasopharyngeal Swab     Status: None   Collection Time: 10/04/21 12:41 AM   Specimen: Nasopharyngeal Swab; Nasopharyngeal(NP) swabs in vial transport medium  Result Value Ref Range Status   SARS Coronavirus 2 by RT PCR NEGATIVE NEGATIVE Final    Comment: (NOTE) SARS-CoV-2 target nucleic acids are NOT DETECTED.  The SARS-CoV-2 RNA is generally detectable in upper  respiratory specimens during the acute phase of infection. The lowest concentration of SARS-CoV-2 viral copies this assay can detect is 138 copies/mL. A negative result does not preclude SARS-Cov-2 infection and should not be used as the sole basis for treatment or other patient management decisions. A negative result may occur with  improper specimen collection/handling, submission of specimen other than nasopharyngeal swab, presence of viral mutation(s) within the areas targeted by this assay, and inadequate number of viral copies(<138 copies/mL). A negative result must be combined with clinical observations, patient history, and epidemiological information. The expected result is Negative.  Fact Sheet for Patients:  EntrepreneurPulse.com.au  Fact Sheet for Healthcare Providers:  IncredibleEmployment.be  This test is no t yet approved or cleared by the  [place] FDA and  has been authorized for detection and/or diagnosis of SARS-CoV-2 by FDA under an Emergency Use Authorization (EUA). This EUA will remain  in effect (meaning this test can be used) for the duration of the COVID-19 declaration under Section 564(b)(1) of the Act, 21 U.S.C.section 360bbb-3(b)(1), unless the authorization is terminated  or revoked sooner.       Influenza A by PCR NEGATIVE NEGATIVE Final   Influenza B by PCR NEGATIVE NEGATIVE Final    Comment: (NOTE) The Xpert Xpress SARS-CoV-2/FLU/RSV plus assay is intended as an aid in the diagnosis of influenza from Nasopharyngeal swab specimens and should not be used as a sole basis for treatment. Nasal washings and aspirates are unacceptable for Xpert Xpress SARS-CoV-2/FLU/RSV testing.  Fact Sheet for Patients: EntrepreneurPulse.com.au  Fact Sheet for Healthcare Providers: IncredibleEmployment.be  This test is not yet approved or cleared by the [place] FDA and has been authorized for detection and/or diagnosis of SARS-CoV-2 by FDA under an Emergency Use Authorization (EUA). This EUA will remain in effect (meaning this test can be used) for the duration of the COVID-19 declaration under Section 564(b)(1) of the Act, 21 U.S.C. section 360bbb-3(b)(1), unless the authorization is terminated or revoked.  Performed at [facility], [place] [address], [place], [place] [address]   MRSA Next Gen by PCR, Nasal     Status: None   Collection Time: 10/04/21 10:41 PM   Specimen: Nasal Mucosa; Nasal Swab  Result Value Ref Range Status   MRSA by PCR Next Gen NOT DETECTED NOT DETECTED Final    Comment: (NOTE) The GeneXpert MRSA Assay (FDA approved for NASAL specimens only), is one component of a comprehensive MRSA colonization surveillance program. It is not intended to diagnose MRSA infection nor to guide or monitor treatment for MRSA infections. Test performance is not  FDA approved in patients less than 12 years old. Performed at [facility], [place] [address], [place], [place] [address]   Aerobic/Anaerobic Culture w Gram Stain (surgical/deep wound)     Status: None   Collection Time: 10/06/21  4:57 PM   Specimen: PATH Other; Tissue  Result Value Ref Range Status   Specimen Description ABSCESS  Final   Special Requests BACK ABCESS DRAINAGE SPEC A  Final   Gram Stain   Final    RARE WBC PRESENT, PREDOMINANTLY MONONUCLEAR RARE GRAM POSITIVE COCCI IN PAIRS    Culture   Final    MODERATE STAPHYLOCOCCUS AUREUS SUSCEPTIBILITIES PERFORMED ON PREVIOUS CULTURE WITHIN THE LAST 5 DAYS. NO ANAEROBES ISOLATED Performed at [facility], [place] [address] [place], [place] [address]    Report Status 10/11/2021 FINAL  Final  Aerobic/Anaerobic Culture w Gram Stain (surgical/deep wound)  Status: None   Collection Time: 10/06/21  4:59 PM   Specimen: PATH Other; Tissue  Result Value Ref Range Status   Specimen Description ABSCESS  Final   Special Requests BACK ABCESS TISSUE  Final   Gram Stain   Final    RARE WBC PRESENT,BOTH PMN AND MONONUCLEAR RARE GRAM POSITIVE COCCI    Culture   Final    MODERATE METHICILLIN RESISTANT STAPHYLOCOCCUS AUREUS NO ANAEROBES ISOLATED Performed at [facility], [place] [address] [place], [place] [address]    Report Status 10/11/2021 FINAL  Final   Organism ID, Bacteria METHICILLIN RESISTANT STAPHYLOCOCCUS AUREUS  Final      Susceptibility   Methicillin resistant staphylococcus aureus - MIC*    CIPROFLOXACIN <=0.5 SENSITIVE Sensitive     ERYTHROMYCIN >=8 RESISTANT Resistant     GENTAMICIN <=0.5 SENSITIVE Sensitive     OXACILLIN >=4 RESISTANT Resistant     TETRACYCLINE <=1 SENSITIVE Sensitive     VANCOMYCIN 1 SENSITIVE Sensitive     TRIMETH/SULFA <=10 SENSITIVE Sensitive     CLINDAMYCIN <=0.25 SENSITIVE Sensitive     RIFAMPIN <=0.5 SENSITIVE Sensitive     Inducible Clindamycin NEGATIVE Sensitive     *  MODERATE METHICILLIN RESISTANT STAPHYLOCOCCUS AUREUS         Radiology Studies: No results found.      Scheduled Meds:  acetaminophen  1,000 mg Oral Q6H   amLODipine  5 mg Oral Daily   aspirin  81 mg Oral Daily   atorvastatin  10 mg Oral Daily   cefadroxil  500 mg Oral BID   doxycycline  100 mg Oral Q12H   DULoxetine  30 mg Oral Daily   enoxaparin (LOVENOX) injection  40 mg Subcutaneous Q24H   gabapentin  400 mg Oral TID   insulin aspart  0-20 Units Subcutaneous TID WC   insulin aspart  0-5 Units Subcutaneous QHS   insulin aspart protamine- aspart  20 Units Subcutaneous BID WC   metoprolol tartrate  12.5 mg Oral BID   pantoprazole  40 mg Oral Daily   pneumococcal 23 valent vaccine  0.5 mL Intramuscular Tomorrow-1000   polyethylene glycol  17 g Oral BID   senna-docusate  1 tablet Oral BID   sodium chloride flush  10-40 mL Intracatheter Q12H   sodium chloride flush  3 mL Intravenous Q12H   Continuous Infusions:     LOS: 10 days       Phillips Climes, MD [facility]   If 7PM-7AM, please contact night-coverage [email or web address]  10/13/2021, 11:49 AM  Patient ID: Brett White, male   DOB: 1960/10/04, 61 y.o.   MRN: [id number] Patient ID: Brett White, male   DOB: 1961-07-20, 61 y.o.   MRN: [id number]  patient ID: Brett White, male   DOB: 1960-10-07, 61 y.o.   MRN: [id number]

## 2021-10-13 NOTE — Progress Notes (Signed)
Physical Therapy Treatment Patient Details Name: Brett White MRN: [id number] DOB: 17-May-1961 Today's Date: 10/13/2021   History of Present Illness 61 y.o. male presents to [facility] hospital on 10/03/2021 with progressive pain and swelling near left scapula, admitted for management of cellulitis. Pt underwent L shoulder I&D abscess. Repeat I&D 10/06/21; 1/9 MRI shoulder pending  Pt also with hyponatremia. PMH includes DMII, HTN, L BKA.    PT Comments    Pt was seen for progression of gait, with O2 sats observed carefully and requiring a standing rest at halfway point in walk.  Pt is stable to stand with RW with fair balance, but with his O2 sats and pain on shoulder, could not be unattended.  Preparing for SNF and this continues to be appropriate due to medical oversight of mobility needed, his skin infection, pain management and assistance required to safety transfer and walk.  Pt is hopeful for this to lend safety and security to his ability to walk and transfer.    Recommendations for follow up therapy are one component of a multi-disciplinary discharge planning process, led by the attending physician.  Recommendations may be updated based on patient status, additional functional criteria and insurance authorization.  Follow Up Recommendations  Skilled nursing-short term rehab (<3 hours/day)     Assistance Recommended at Discharge Intermittent Supervision/Assistance  Patient can return home with the following A little help with walking and/or transfers;A little help with bathing/dressing/bathroom;Assistance with cooking/housework;Assist for transportation   Equipment Recommendations  None recommended by PT    Recommendations for Other Services       Precautions / Restrictions Precautions Precautions: Fall Precaution Comments: old L BKA Required Braces or Orthoses:  (has prosthesis at [facility] awaiting him) Restrictions Weight Bearing Restrictions: No     Mobility  Bed Mobility Overal  bed mobility: Needs Assistance Bed Mobility: Supine to Sit;Sit to Supine     Supine to sit: Supervision;HOB elevated Sit to supine: Supervision;HOB elevated   General bed mobility comments: pt is planning to go to SNF and will have access to bed features    Transfers Overall transfer level: Needs assistance Equipment used: Rolling walker (2 wheels) Transfers: Sit to/from Stand Sit to Stand: Min guard           General transfer comment: standing well from side of bed but pain in L shoulder is restricting him    Ambulation/Gait Ambulation/Gait assistance: Min guard Gait Distance (Feet): 30 Feet Assistive device: Rolling walker (2 wheels)   Gait velocity: reduced Gait velocity interpretation: <1.31 ft/sec, indicative of household ambulator Pre-gait activities: monitoring vitals, pt ready to move quickly General Gait Details: stopped twice on trip due to pain on shoulder and O2 sats   Stairs             Wheelchair Mobility    Modified Rankin (Stroke Patients Only)       Balance Overall balance assessment: Needs assistance Sitting-balance support: Feet supported Sitting balance-Leahy Scale: Good     Standing balance support: Bilateral upper extremity supported;During functional activity Standing balance-Leahy Scale: Poor Standing balance comment: relies on walker for steadiness but at fair balance with it                            Cognition Arousal/Alertness: Awake/alert Behavior During Therapy: WFL for tasks assessed/performed Overall Cognitive Status: Within Functional Limits for tasks assessed  General Comments: 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 - Lower Extremity Ankle Circles/Pumps: AROM;Right Quad Sets: AROM;10 reps Heel Slides: AROM;10 reps Straight Leg Raises: AROM;10 reps    General Comments General comments (skin integrity, edema, etc.): pt  is demonstrating a good tolerance to move, sats down to 87% with room air during gait but likely related to his hand on the walker      Pertinent Vitals/Pain Pain Assessment: 0-10 Pain Score: 8  Pain Location: L scapula Pain Descriptors / Indicators: Grimacing;Aching;Sharp Pain Intervention(s): Limited activity within patient's tolerance;Monitored during session;Repositioned;Premedicated before session;Patient requesting pain meds-RN notified    Home Living                          Prior Function            PT Goals (current goals can now be found in the care plan section) Acute Rehab PT Goals Patient Stated Goal: to reduce L shoulder discomfort and return to independence Progress towards PT goals: Progressing toward goals    Frequency    Min 3X/week      PT Plan Current plan remains appropriate    Co-evaluation              AM-PAC PT "6 Clicks" Mobility   Outcome Measure  Help needed turning from your back to your side while in a flat bed without using bedrails?: A Little Help needed moving from lying on your back to sitting on the side of a flat bed without using bedrails?: A Little Help needed moving to and from a bed to a chair (including a wheelchair)?: A Little Help needed standing up from a chair using your arms (e.g., wheelchair or bedside chair)?: A Little Help needed to walk in hospital room?: A Lot Help needed climbing 3-5 steps with a railing? : Total 6 Click Score: 15    End of Session Equipment Utilized During Treatment: Gait belt Activity Tolerance: Patient limited by pain Patient left: with call bell/phone within reach;in bed;with bed alarm set Nurse Communication: Mobility status;Other (comment) (reviewed assistance needed to walk with nursing) PT Visit Diagnosis: Other abnormalities of gait and mobility (R26.89);Muscle weakness (generalized) (M62.81);Pain Pain - Right/Left: Left Pain - part of body: Shoulder     Time:  1140-1155 PT Time Calculation (min) (ACUTE ONLY): 15 min  Charges:  $Gait Training: 8-22 mins           Ivar Drape 10/13/2021, 12:34 PM  Samul Dada, PT PhD Acute Rehab Dept. Number: [facility] [phone] and [facility] [phone]

## 2021-10-13 NOTE — Plan of Care (Signed)
°  Problem: Health Behavior/Discharge Planning: Goal: Ability to manage health-related needs will improve Outcome: Progressing   Problem: Clinical Measurements: Goal: Ability to maintain clinical measurements within normal limits will improve Outcome: Progressing Goal: Will remain free from infection Outcome: Progressing Goal: Diagnostic test results will improve Outcome: Progressing Goal: Respiratory complications will improve Outcome: Progressing Goal: Cardiovascular complication will be avoided Outcome: Progressing   Problem: Activity: Goal: Risk for activity intolerance will decrease Outcome: Progressing   Problem: Nutrition: Goal: Adequate nutrition will be maintained Outcome: Progressing   Problem: Coping: Goal: Level of anxiety will decrease Outcome: Progressing   Problem: Elimination: Goal: Will not experience complications related to bowel motility Outcome: Progressing Goal: Will not experience complications related to urinary retention Outcome: Progressing   Problem: Pain Managment: Goal: General experience of comfort will improve Outcome: Progressing   Problem: Safety: Goal: Ability to remain free from injury will improve Outcome: Progressing   Problem: Skin Integrity: Goal: Risk for impaired skin integrity will decrease Outcome: Progressing   Problem: Clinical Measurements: Goal: Ability to avoid or minimize complications of infection will improve Outcome: Progressing   Problem: Skin Integrity: Goal: Skin integrity will improve Outcome: Progressing   Problem: Clinical Measurements: Goal: Ability to avoid or minimize complications of infection will improve Outcome: Progressing   Problem: Skin Integrity: Goal: Skin integrity will improve Outcome: Progressing

## 2021-10-14 DIAGNOSIS — L02212 Cutaneous abscess of back [any part, except buttock]: Secondary | ICD-10-CM | POA: Diagnosis not present

## 2021-10-14 LAB — GLUCOSE, CAPILLARY
Glucose-Capillary: 170 mg/dL — ABNORMAL HIGH (ref 70–99)
Glucose-Capillary: 154 mg/dL — ABNORMAL HIGH (ref 70–99)
Glucose-Capillary: 90 mg/dL (ref 70–99)
Glucose-Capillary: 100 mg/dL — ABNORMAL HIGH (ref 70–99)
Glucose-Capillary: 162 mg/dL — ABNORMAL HIGH (ref 70–99)

## 2021-10-14 MED ORDER — INSULIN ASPART PROT & ASPART (70-30 MIX) 100 UNIT/ML ~~LOC~~ SUSP
16.0000 [IU] | Freq: Two times a day (BID) | SUBCUTANEOUS | Status: DC
  Administered 2021-10-14 – 2021-10-20 (×12): 16 [IU] via SUBCUTANEOUS

## 2021-10-14 NOTE — Progress Notes (Signed)
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  The patient reported noticing some pain and swelling near the left scapula approximately 2 days prior to his presentation.  He thought he might have been bit by a spider.  He was going to be discharged home but developed fever and tachycardia.   Assessment & Plan:   Principal Problem:   Cellulitis Active Problems:   Uncontrolled type 2 diabetes mellitus with hyperglycemia (HCC)   Back abscess   AKI (acute kidney injury) ([place])   1-Abscess and cellulitis of the back near the left scapula, Sepsis; -Patient had I&D performed in the ED.  He received 1 dose of IV dalbavancin. Subsequently was admitted by triad. -Patient underwent excision and debridement of left upper back subcutaneous abscess and phlegmon in the OR on 1/6 -Wound growing staph -Antibiotics management per ID, initially on doxycycline, but given extensive cellulitis cefadroxil has been added, for 2 weeks treatment, continue with antibiotic treatment until 10/22/2021 -Input greatly appreciated, recommendation for local wound care and dressing.  No need for further debridement.  2-insulin-dependent type 2 diabetes, uncontrolled with hyperglycemia A1c 12.2 -Patient reported that he is not able to afford insulin only taking metformin -Was a started on 70/30, better controlled after increasing to 18 units twice daily -Continue with sliding scale insulin  Peripheral neuropathy: Need better diabetes control  AKI from dehydration and sepsis Continue to hold Cozaar Continue with hydration Cr peak 1.6 Cr down to 1.4---1.0  Hyponatremia: Improved  with IV fluids.  HTN: Continue to hold Cozaar due to AKI Start Norvasc 1/10.  PAD: Continue with aspirin and statins Right leg ABI noncompressible artery Will need follow-up with vascular surgery  History of left BKA by Dr. 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  Constipation: KUB showed constipation Continue with laxative  Depression: Continue with Cymbalta  FFT, poor social support.  PT consulted Needs SNF placement.     Estimated body mass index is 26.96 kg/m as calculated from the following:   Height as of this encounter: 6\' 2"  (1.88 m).   Weight as of this encounter: 95.3 kg.   DVT prophylaxis: Lovenox Code Status: Full code Family Communication: care discussed with patient, none at bedside Disposition Plan:  Status is: Inpatient  Patient is medically stable for discharge, awaiting SNF bed availability/insurance authorization      Consultants:  General surgery  Infectious disease  Procedures:  I and D 1-6  Antimicrobials:    Subjective:  No significant events overnight, complaining of pain at the surgical site   Objective: Vitals:   10/13/21 2354 10/14/21 0413 10/14/21 0819 10/14/21 1137  BP: (!) 153/98 (!) 166/101 (!) 176/101 128/85  Pulse: 73 67 72 66  Resp: 16 12 11 14   Temp: 97.9 F (36.6 C) 98 F (36.7 C) 98 F (36.7 C) 98.3 F (36.8 C)  TempSrc: Oral Oral Oral Axillary  SpO2: 92% 93% 90% 90%  Weight:      Height:        Intake/Output Summary (Last 24 hours) at 10/14/2021 1336 Last data filed at 10/14/2021 0930 Gross per 24 hour  Intake --  Output 1850 ml  Net -1850 ml   Danley Danker  Weights   10/03/21 1317 10/06/21 1547  Weight: 95.3 kg 95.3 kg    Examination:  Awake Alert, Oriented X 3, No new F.N deficits, Normal affect Symmetrical Chest wall movement, Good air movement bilaterally, CTAB RRR,No Gallops,Rubs or new Murmurs, No Parasternal Heave +ve B.Sounds, Abd Soft, No tenderness, No rebound - guarding  or rigidity. No Cyanosis, left BKA, left upper back bandaged     Data Reviewed: I have personally reviewed following labs and imaging studies  CBC: Recent Labs  Lab 10/08/21 0108 10/09/21 0147 10/10/21 0103 10/12/21 0117  WBC 7.4 6.6 6.7 7.1  HGB 10.6* 10.9* 10.6* 11.2*  HCT 34.4* 35.9* 33.9* 34.7*  MCV 88.0 88.4 86.0 84.8  PLT 408* 422* 434* [id number]*   Basic Metabolic Panel: Recent Labs  Lab 10/08/21 0108 10/09/21 0147 10/10/21 1042 10/12/21 0117  NA 135 132* 133* 134*  K 4.8 4.6 4.4 4.3  CL 97* 96* 95* 97*  CO2 29 28 28 30   GLUCOSE 253* 295* 178* 257*  BUN 32* 30* 20 23*  CREATININE 1.68* 1.42* 1.05 1.09  CALCIUM 8.6* 8.6* 8.9 8.6*   GFR: Estimated Creatinine Clearance: 85.9 mL/min (by C-G formula based on SCr of 1.09 mg/dL). Liver Function Tests: No results for input(s): AST, ALT, ALKPHOS, BILITOT, PROT, ALBUMIN in the last 168 hours.  No results for input(s): LIPASE, AMYLASE in the last 168 hours. No results for input(s): AMMONIA in the last 168 hours. Coagulation Profile: No results for input(s): INR, PROTIME in the last 168 hours.  Cardiac Enzymes: No results for input(s): CKTOTAL, CKMB, CKMBINDEX, TROPONINI in the last 168 hours. BNP (last 3 results) No results for input(s): PROBNP in the last 8760 hours. HbA1C: No results for input(s): HGBA1C in the last 72 hours. CBG: Recent Labs  Lab 10/13/21 1208 10/13/21 1557 10/13/21 2017 10/14/21 0818 10/14/21 1140  GLUCAP 230* 157* 111* 170* 154*   Lipid Profile: No results for input(s): CHOL, HDL, LDLCALC, TRIG, CHOLHDL, LDLDIRECT in the last 72 hours. Thyroid Function Tests: No results for input(s): TSH, T4TOTAL, FREET4, T3FREE, THYROIDAB in the last 72 hours. Anemia Panel: No results for input(s): VITAMINB12, FOLATE, FERRITIN, TIBC, IRON, RETICCTPCT in the last 72 hours. Sepsis Labs: No results for input(s): PROCALCITON, LATICACIDVEN in the last 168 hours.   Recent Results (from the past 240  hour(s))  MRSA Next Gen by PCR, Nasal     Status: None   Collection Time: 10/04/21 10:41 PM   Specimen: Nasal Mucosa; Nasal Swab  Result Value Ref Range Status   MRSA by PCR Next Gen NOT DETECTED NOT DETECTED Final    Comment: (NOTE) The GeneXpert MRSA Assay (FDA approved for NASAL specimens only), is one component of a comprehensive MRSA colonization surveillance program. It is not intended to diagnose MRSA infection nor to guide or monitor treatment for MRSA infections. Test performance is not FDA approved in patients less than 82 years old. Performed at [facility], [place] [address], [place],  [address]   Aerobic/Anaerobic Culture w Gram Stain (surgical/deep wound)     Status: None   Collection Time: 10/06/21  4:57 PM   Specimen: PATH Other; Tissue  Result Value Ref Range Status   Specimen Description ABSCESS  Final   Special Requests BACK ABCESS DRAINAGE SPEC A  Final   Gram Stain   Final    RARE WBC PRESENT, PREDOMINANTLY MONONUCLEAR RARE GRAM POSITIVE COCCI IN PAIRS    Culture   Final    MODERATE STAPHYLOCOCCUS AUREUS  SUSCEPTIBILITIES PERFORMED ON PREVIOUS CULTURE WITHIN THE LAST 5 DAYS. NO ANAEROBES ISOLATED Performed at [facility], [place] [address], [place], [place] [address]    Report Status 10/11/2021 FINAL  Final  Aerobic/Anaerobic Culture w Gram Stain (surgical/deep wound)     Status: None   Collection Time: 10/06/21  4:59 PM   Specimen: PATH Other; Tissue  Result Value Ref Range Status   Specimen Description ABSCESS  Final   Special Requests BACK ABCESS TISSUE  Final   Gram Stain   Final    RARE WBC PRESENT,BOTH PMN AND MONONUCLEAR RARE GRAM POSITIVE COCCI    Culture   Final    MODERATE METHICILLIN RESISTANT STAPHYLOCOCCUS AUREUS NO ANAEROBES ISOLATED Performed at [facility], [place] [address] [place], [place] [address]    Report Status 10/11/2021 FINAL  Final   Organism ID, Bacteria METHICILLIN RESISTANT STAPHYLOCOCCUS AUREUS   Final      Susceptibility   Methicillin resistant staphylococcus aureus - MIC*    CIPROFLOXACIN <=0.5 SENSITIVE Sensitive     ERYTHROMYCIN >=8 RESISTANT Resistant     GENTAMICIN <=0.5 SENSITIVE Sensitive     OXACILLIN >=4 RESISTANT Resistant     TETRACYCLINE <=1 SENSITIVE Sensitive     VANCOMYCIN 1 SENSITIVE Sensitive     TRIMETH/SULFA <=10 SENSITIVE Sensitive     CLINDAMYCIN <=0.25 SENSITIVE Sensitive     RIFAMPIN <=0.5 SENSITIVE Sensitive     Inducible Clindamycin NEGATIVE Sensitive     * MODERATE METHICILLIN RESISTANT STAPHYLOCOCCUS AUREUS         Radiology Studies: No results found.      Scheduled Meds:  acetaminophen  1,000 mg Oral Q6H   amLODipine  5 mg Oral Daily   aspirin  81 mg Oral Daily   atorvastatin  10 mg Oral Daily   cefadroxil  500 mg Oral BID   doxycycline  100 mg Oral Q12H   DULoxetine  30 mg Oral Daily   enoxaparin (LOVENOX) injection  40 mg Subcutaneous Q24H   gabapentin  400 mg Oral TID   insulin aspart  0-20 Units Subcutaneous TID WC   insulin aspart  0-5 Units Subcutaneous QHS   insulin aspart protamine- aspart  20 Units Subcutaneous BID WC   metoprolol tartrate  12.5 mg Oral BID   pantoprazole  40 mg Oral Daily   pneumococcal 23 valent vaccine  0.5 mL Intramuscular Tomorrow-1000   polyethylene glycol  17 g Oral BID   senna-docusate  1 tablet Oral BID   sodium chloride flush  10-40 mL Intracatheter Q12H   sodium chloride flush  3 mL Intravenous Q12H   Continuous Infusions:     LOS: 11 days       Phillips Climes, MD [facility]   If 7PM-7AM, please contact night-coverage [email or web address]  10/14/2021, 1:36 PM  Patient ID: Brett White, male   DOB: 1960-11-13, 61 y.o.   MRN: [id number] Patient ID: Brett White, male   DOB: Jul 28, 1961, 61 y.o.   MRN: [id number]  patient ID: Brett White, male   DOB: Mar 12, 1961, 61 y.o.   MRN: [id number] Patient ID: Brett White, male   DOB: Nov 30, 1960, 61 y.o.   MRN: [id number]

## 2021-10-15 DIAGNOSIS — L02212 Cutaneous abscess of back [any part, except buttock]: Secondary | ICD-10-CM | POA: Diagnosis not present

## 2021-10-15 LAB — GLUCOSE, CAPILLARY
Glucose-Capillary: 180 mg/dL — ABNORMAL HIGH (ref 70–99)
Glucose-Capillary: 157 mg/dL — ABNORMAL HIGH (ref 70–99)
Glucose-Capillary: 162 mg/dL — ABNORMAL HIGH (ref 70–99)
Glucose-Capillary: 157 mg/dL — ABNORMAL HIGH (ref 70–99)

## 2021-10-15 NOTE — Progress Notes (Signed)
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  The patient reported noticing some pain and swelling near the left scapula approximately 2 days prior to his presentation.  He thought he might have been bit by a spider.  He was going to be discharged home but developed fever and tachycardia.   Assessment & Plan:   Principal Problem:   Cellulitis Active Problems:   Uncontrolled type 2 diabetes mellitus with hyperglycemia (HCC)   Back abscess   AKI (acute kidney injury) (HCC)   1-Abscess and cellulitis of the back near the left scapula, Sepsis; -Patient had I&D performed in the ED.  He received 1 dose of IV dalbavancin. Subsequently was admitted by triad. -Patient underwent excision and debridement of left upper back subcutaneous abscess and phlegmon in the OR on 1/6 -Wound growing staph -Antibiotics management per ID, initially on doxycycline, but given extensive cellulitis cefadroxil has been added, for 2 weeks treatment, continue with antibiotic treatment until 10/22/2021 -Input greatly appreciated, recommendation for local wound care and dressing.  No need for further debridement.  2-insulin-dependent type 2 diabetes, uncontrolled with hyperglycemia A1c 12.2 -Patient reported that he is not able to afford insulin only taking metformin -CBG currently well controlled, he had couple of low readings yesterday, so I have lowered his 7030 to 18 units twice daily.   -Continue with sliding scale insulin  Peripheral neuropathy: Need better diabetes control  AKI from dehydration and sepsis Continue to hold Cozaar Continue with hydration Cr peak 1.6 Cr  down to 1.4---1.0  Hyponatremia: Improved with IV fluids.  HTN: Continue to hold Cozaar due to AKI Start Norvasc 1/10.  PAD: Continue with aspirin and statins Right leg ABI noncompressible artery Will need follow-up with vascular surgery  History of left BKA by Dr. 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  Constipation: KUB showed constipation Continue with laxative  Depression: Continue with Cymbalta  FFT, poor social support.  PT consulted Needs SNF placement.     Estimated body mass index is 26.96 kg/m as calculated from the following:   Height as of this encounter: 6\' 2"  (1.88 m).   Weight as of this encounter: 95.3 kg.   DVT prophylaxis: Lovenox Code Status: Full code Family Communication: care discussed with patient, none at bedside Disposition Plan:  Status is: Inpatient  Patient is medically stable for discharge, awaiting SNF bed availability/insurance authorization      Consultants:  General surgery  Infectious disease  Procedures:  I and D 1-6  Antimicrobials:    Subjective:  No significant events overnight, complaining of pain at the surgical site   Objective: Vitals:   10/15/21 0320 10/15/21 0623 10/15/21 0831 10/15/21 1238  BP: (!) 157/92  (!) 146/90 90/62  Pulse: 67  63 71  Resp: 16  13 14   Temp: 97.8 F (36.6 C)  97.9 F (36.6 C) 98 F (36.7 C)  TempSrc: Oral  Oral Oral  SpO2: 91% 96% 95% 93%  Weight:      Height:        Intake/Output Summary (Last 24 hours) at 10/15/2021 1249 Last data filed at 10/15/2021 0224 Gross per 24 hour  Intake 243 ml  Output 1100  ml  Net -857 ml   Filed Weights   10/03/21 1317 10/06/21 1547  Weight: 95.3 kg 95.3 kg    Examination:  Awake Alert, Oriented X 3, No new F.N deficits, Normal affect Symmetrical Chest wall movement, Good air movement bilaterally, CTAB RRR,No Gallops,Rubs or new Murmurs No Cyanosis, left BKA, left upper back bandaged     Data Reviewed: I  have personally reviewed following labs and imaging studies  CBC: Recent Labs  Lab 10/09/21 0147 10/10/21 0103 10/12/21 0117  WBC 6.6 6.7 7.1  HGB 10.9* 10.6* 11.2*  HCT 35.9* 33.9* 34.7*  MCV 88.4 86.0 84.8  PLT 422* 434* 455*   Basic Metabolic Panel: Recent Labs  Lab 10/09/21 0147 10/10/21 1042 10/12/21 0117  NA 132* 133* 134*  K 4.6 4.4 4.3  CL 96* 95* 97*  CO2 28 28 30   GLUCOSE 295* 178* 257*  BUN 30* 20 23*  CREATININE 1.42* 1.05 1.09  CALCIUM 8.6* 8.9 8.6*   GFR: Estimated Creatinine Clearance: 85.9 mL/min (by C-G formula based on SCr of 1.09 mg/dL). Liver Function Tests: No results for input(s): AST, ALT, ALKPHOS, BILITOT, PROT, ALBUMIN in the last 168 hours.  No results for input(s): LIPASE, AMYLASE in the last 168 hours. No results for input(s): AMMONIA in the last 168 hours. Coagulation Profile: No results for input(s): INR, PROTIME in the last 168 hours.  Cardiac Enzymes: No results for input(s): CKTOTAL, CKMB, CKMBINDEX, TROPONINI in the last 168 hours. BNP (last 3 results) No results for input(s): PROBNP in the last 8760 hours. HbA1C: No results for input(s): HGBA1C in the last 72 hours. CBG: Recent Labs  Lab 10/14/21 1701 10/14/21 1800 10/14/21 2107 10/15/21 0833 10/15/21 1125  GLUCAP 90 100* 162* 180* 157*   Lipid Profile: No results for input(s): CHOL, HDL, LDLCALC, TRIG, CHOLHDL, LDLDIRECT in the last 72 hours. Thyroid Function Tests: No results for input(s): TSH, T4TOTAL, FREET4, T3FREE, THYROIDAB in the last 72 hours. Anemia Panel: No results for input(s): VITAMINB12, FOLATE, FERRITIN, TIBC, IRON, RETICCTPCT in the last 72 hours. Sepsis Labs: No results for input(s): PROCALCITON, LATICACIDVEN in the last 168 hours.   Recent Results (from the past 240 hour(s))  Aerobic/Anaerobic Culture w Gram Stain (surgical/deep wound)     Status: None   Collection Time: 10/06/21  4:57 PM   Specimen: PATH Other; Tissue  Result Value Ref Range  Status   Specimen Description ABSCESS  Final   Special Requests BACK ABCESS DRAINAGE SPEC A  Final   Gram Stain   Final    RARE WBC PRESENT, PREDOMINANTLY MONONUCLEAR RARE GRAM POSITIVE COCCI IN PAIRS    Culture   Final    MODERATE STAPHYLOCOCCUS AUREUS SUSCEPTIBILITIES PERFORMED ON PREVIOUS CULTURE WITHIN THE LAST 5 DAYS. NO ANAEROBES ISOLATED Performed at [facility], [address], [place], [place]    Report Status 10/11/2021 FINAL  Final  Aerobic/Anaerobic Culture w Gram Stain (surgical/deep wound)     Status: None   Collection Time: 10/06/21  4:59 PM   Specimen: PATH Other; Tissue  Result Value Ref Range Status   Specimen Description ABSCESS  Final   Special Requests BACK ABCESS TISSUE  Final   Gram Stain   Final    RARE WBC PRESENT,BOTH PMN AND MONONUCLEAR RARE GRAM POSITIVE COCCI    Culture   Final    MODERATE METHICILLIN RESISTANT STAPHYLOCOCCUS AUREUS NO ANAEROBES ISOLATED Performed at [facility], [address], [place], [place]  Report Status 10/11/2021 FINAL  Final   Organism ID, Bacteria METHICILLIN RESISTANT STAPHYLOCOCCUS AUREUS  Final      Susceptibility   Methicillin resistant staphylococcus aureus - MIC*    CIPROFLOXACIN <=0.5 SENSITIVE Sensitive     ERYTHROMYCIN >=8 RESISTANT Resistant     GENTAMICIN <=0.5 SENSITIVE Sensitive     OXACILLIN >=4 RESISTANT Resistant     TETRACYCLINE <=1 SENSITIVE Sensitive     VANCOMYCIN 1 SENSITIVE Sensitive     TRIMETH/SULFA <=10 SENSITIVE Sensitive     CLINDAMYCIN <=0.25 SENSITIVE Sensitive     RIFAMPIN <=0.5 SENSITIVE Sensitive     Inducible Clindamycin NEGATIVE Sensitive     * MODERATE METHICILLIN RESISTANT STAPHYLOCOCCUS AUREUS         Radiology Studies: No results found.      Scheduled Meds:  acetaminophen  1,000 mg Oral Q6H   amLODipine  5 mg Oral Daily   aspirin  81 mg Oral Daily   atorvastatin  10 mg Oral Daily   cefadroxil  500 mg Oral BID   doxycycline   100 mg Oral Q12H   DULoxetine  30 mg Oral Daily   enoxaparin (LOVENOX) injection  40 mg Subcutaneous Q24H   gabapentin  400 mg Oral TID   insulin aspart  0-20 Units Subcutaneous TID WC   insulin aspart  0-5 Units Subcutaneous QHS   insulin aspart protamine- aspart  16 Units Subcutaneous BID WC   metoprolol tartrate  12.5 mg Oral BID   pantoprazole  40 mg Oral Daily   pneumococcal 23 valent vaccine  0.5 mL Intramuscular Tomorrow-1000   polyethylene glycol  17 g Oral BID   senna-docusate  1 tablet Oral BID   sodium chloride flush  10-40 mL Intracatheter Q12H   sodium chloride flush  3 mL Intravenous Q12H   Continuous Infusions:     LOS: 12 days       Huey Bienenstock, MD [facility]   If 7PM-7AM, please contact night-coverage [email or web address]  10/15/2021, 12:49 PM  Patient ID: Brett White, male   DOB: 12-20-1960, 61 y.o.   MRN: [id number] Patient ID: Brett White, male   DOB: 1960/11/27, 61 y.o.   MRN: [id number]  patient ID: Brett White, male   DOB: 04/01/1961, 61 y.o.   MRN: [id number] Patient ID: Brett White, male   DOB: 04-07-1961, 61 y.o.   MRN: [id number] Patient ID: Brett White, male   DOB: 1961/04/18, 61 y.o.   MRN: [id number]

## 2021-10-15 NOTE — Plan of Care (Signed)
  Problem: Health Behavior/Discharge Planning: Goal: Ability to manage health-related needs will improve Outcome: Progressing   Problem: Clinical Measurements: Goal: Ability to maintain clinical measurements within normal limits will improve Outcome: Progressing   

## 2021-10-16 LAB — GLUCOSE, CAPILLARY
Glucose-Capillary: 167 mg/dL — ABNORMAL HIGH (ref 70–99)
Glucose-Capillary: 143 mg/dL — ABNORMAL HIGH (ref 70–99)
Glucose-Capillary: 148 mg/dL — ABNORMAL HIGH (ref 70–99)
Glucose-Capillary: 128 mg/dL — ABNORMAL HIGH (ref 70–99)

## 2021-10-16 MED ORDER — SENNOSIDES-DOCUSATE SODIUM 8.6-50 MG PO TABS
2.0000 | ORAL_TABLET | Freq: Two times a day (BID) | ORAL | Status: DC
  Filled 2021-10-16 (×5): qty 2

## 2021-10-16 NOTE — TOC Progression Note (Addendum)
Transition of Care [facility]) - Progression Note    Patient Details  Name: Brett White MRN: [id number] Date of Birth: [birth year]  [facility]) CM/SW Contact  Mearl Latin, LCSW Phone Number: 10/16/2021, 8:28 AM  Clinical Narrative:    Lacinda Axon still awaiting authorization once [employer] opens back tomorrow from the holiday weekend. Patient will need updated therapy notes; paged therapy.  Expected Discharge Plan: Skilled Nursing Facility Barriers to Discharge: SNF Pending bed offer, Continued Medical Work up, English as a second language teacher  Expected Discharge Plan and Services Expected Discharge Plan: Skilled Nursing Facility In-house Referral: Clinical Social Work   Post Acute Care Choice: Skilled Nursing Facility Living arrangements for the past 2 months: Apartment                                       Social Determinants of Health (SDOH) Interventions    Readmission Risk Interventions No flowsheet data found.

## 2021-10-16 NOTE — Progress Notes (Signed)
Physical Therapy Treatment Patient Details Name: Brett White MRN: [id number] DOB: June 17, 1961 Today's Date: 10/16/2021   History of Present Illness 61 y.o. male presents to [facility] hospital on 10/03/2021 with progressive pain and swelling near left scapula, admitted for management of cellulitis. Pt underwent L shoulder I&D abscess. Repeat I&D 10/06/21; 1/9 MRI shoulder pending  Pt also with hyponatremia. PMH includes DMII, HTN, L BKA.    PT Comments    Patient progressing with ambulation distance, though still not well tolerated with pain L shoulder 8/10 and needing to rest with less hand support twice during ambulation.   Reports falling daily at home and had very little room to maneuver with his walker/wheelchair in crowded apartment he was renting space.  Agree with SNF level rehab at d/c.  Encouraged pt to reach out to prosthetist while in rehab to see if fitting can take place with progression of prosthetic training.  PT will follow until d/c.  Recommendations for follow up therapy are one component of a multi-disciplinary discharge planning process, led by the attending physician.  Recommendations may be updated based on patient status, additional functional criteria and insurance authorization.  Follow Up Recommendations  Skilled nursing-short term rehab (<3 hours/day)     Assistance Recommended at Discharge Intermittent Supervision/Assistance  Patient can return home with the following A little help with walking and/or transfers;A little help with bathing/dressing/bathroom;Assistance with cooking/housework;Assist for transportation   Equipment Recommendations  None recommended by PT    Recommendations for Other Services       Precautions / Restrictions Precautions Precautions: Fall Precaution Comments: old L BKA     Mobility  Bed Mobility Overal bed mobility: Needs Assistance       Supine to sit: Modified independent (Device/Increase time) Sit to supine: Modified independent  (Device/Increase time)        Transfers Overall transfer level: Needs assistance Equipment used: Rolling walker (2 wheels) Transfers: Sit to/from Stand Sit to Stand: Min guard           General transfer comment: assist for safety, balance and walker adjustment    Ambulation/Gait Ambulation/Gait assistance: Min guard Gait Distance (Feet): 50 Feet Assistive device: Rolling walker (2 wheels) Gait Pattern/deviations: Step-to pattern;Decreased stride length       General Gait Details: two stops due to pain in shoulder, pt leaning on wall for support; assist for balance, pt with wobbles when he removed one hand from walker to adjust mask, etc   Stairs             Wheelchair Mobility    Modified Rankin (Stroke Patients Only)       Balance Overall balance assessment: Needs assistance Sitting-balance support: Feet supported Sitting balance-Leahy Scale: Good     Standing balance support: Bilateral upper extremity supported;During functional activity Standing balance-Leahy Scale: Poor                              Cognition Arousal/Alertness: Awake/alert Behavior During Therapy: WFL for tasks assessed/performed;Flat affect Overall Cognitive Status: Within Functional Limits for tasks assessed                                          Exercises Other Exercises Other Exercises: patient demonstrated amputee exercises he performs in supine including SLR, knee flexion/ext, hip adduction/abduction    General Comments General comments (skin integrity,  edema, etc.): fatigued with ambulation. SpO2 reading low with poor pleth with hand supported on walker increased to 90's when hand unsupported      Pertinent Vitals/Pain Pain Assessment: 0-10 Pain Score: 8  Pain Location: L scapula Pain Descriptors / Indicators: Throbbing Pain Intervention(s): Monitored during session;Premedicated before session    Home Living                           Prior Function            PT Goals (current goals can now be found in the care plan section) Progress towards PT goals: Progressing toward goals    Frequency    Min 3X/week      PT Plan Current plan remains appropriate    Co-evaluation              AM-PAC PT "6 Clicks" Mobility   Outcome Measure  Help needed turning from your back to your side while in a flat bed without using bedrails?: None Help needed moving from lying on your back to sitting on the side of a flat bed without using bedrails?: None Help needed moving to and from a bed to a chair (including a wheelchair)?: A Little Help needed standing up from a chair using your arms (e.g., wheelchair or bedside chair)?: A Little Help needed to walk in hospital room?: A Little Help needed climbing 3-5 steps with a railing? : A Lot 6 Click Score: 19    End of Session Equipment Utilized During Treatment: Gait belt Activity Tolerance: Patient limited by fatigue Patient left: in bed;with call bell/phone within reach;with bed alarm set   PT Visit Diagnosis: Other abnormalities of gait and mobility (R26.89);Muscle weakness (generalized) (M62.81);Pain Pain - Right/Left: Left Pain - part of body: Shoulder     Time: 0935-1000 PT Time Calculation (min) (ACUTE ONLY): 25 min  Charges:  $Gait Training: 8-22 mins $Therapeutic Activity: 8-22 mins                     Sheran Lawless, PT Acute Rehabilitation Services Pager:[phone] Office:[phone] 10/16/2021    Elray Mcgregor 10/16/2021, 10:20 AM

## 2021-10-16 NOTE — Progress Notes (Signed)
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  The patient reported noticing some pain and swelling near the left scapula approximately 2 days prior to his presentation.  He thought he might have been bit by a spider.  He was going to be discharged home but developed fever and tachycardia.   Assessment & Plan:   Principal Problem:   Cellulitis Active Problems:   Uncontrolled type 2 diabetes mellitus with hyperglycemia (HCC)   Back abscess   AKI (acute kidney injury) ([place])   1-Abscess and cellulitis of the back near the left scapula, Sepsis; -Patient had I&D performed in the ED.  He received 1 dose of IV dalbavancin. Subsequently was admitted by triad. -Patient underwent excision and debridement of left upper back subcutaneous abscess and phlegmon in the OR on 1/6 -Wound growing staph -Antibiotics management per ID, initially on doxycycline, but given extensive cellulitis cefadroxil has been added, for 2 weeks treatment, continue with antibiotic treatment until 10/22/2021 -Input greatly appreciated, recommendation for local wound care and dressing.  No need for further debridement.  2-insulin-dependent type 2 diabetes, uncontrolled with hyperglycemia A1c 12.2 -Patient reported that he is not able to afford insulin only taking metformin -CBG currently well controlled, he had couple of low readings yesterday, so I have lowered his 7030 to 18 units twice daily.   -Continue with sliding scale insulin  Peripheral neuropathy: Need better diabetes control  AKI from dehydration and sepsis Continue to hold Cozaar Continue with hydration Cr peak 1.6 Cr  down to 1.4---1.0  Hyponatremia: Improved with IV fluids.  HTN: Continue to hold Cozaar due to AKI Start Norvasc 1/10.  PAD: Continue with aspirin and statins Right leg ABI noncompressible artery Will need follow-up with vascular surgery  History of left BKA by Dr. 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  Constipation: KUB showed constipation Continue with laxative  Depression: Continue with Cymbalta  FFT, poor social support.  PT consulted Needs SNF placement.     Estimated body mass index is 26.96 kg/m as calculated from the following:   Height as of this encounter: 6\' 2"  (1.88 m).   Weight as of this encounter: 95.3 kg.   DVT prophylaxis: Lovenox Code Status: Full code Family Communication: care discussed with patient, none at bedside Disposition Plan:  Status is: Inpatient  Patient is medically stable for discharge, awaiting SNF bed availability/insurance authorization      Consultants:  General surgery  Infectious disease  Procedures:  I and D 1-6  Antimicrobials:    Subjective:  No significant events overnight, still complaining of pain at the surgical site, but overall reported has been improving.  Objective: Vitals:   10/15/21 1626 10/15/21 1945 10/15/21 2350 10/16/21 0843  BP: 132/81 123/79 134/79 (!) 166/95  Pulse: 78 74 63 67  Resp: 12 18 17 17   Temp: 98.3 F (36.8 C) 97.7 F (36.5 C) 98.8 F (37.1 C) 98.9 F (37.2 C)  TempSrc: Oral Oral Oral Oral  SpO2: 90% 93% 95% 95%  Weight:      Height:        Intake/Output Summary (Last 24 hours) at 10/16/2021 1205 Last data filed at 10/16/2021 0846 Gross per 24  hour  Intake 240 ml  Output 1300 ml  Net -1060 ml   Filed Weights   10/03/21 1317 10/06/21 1547  Weight: 95.3 kg 95.3 kg    Examination:  Awake Alert, Oriented X 3, No new F.N deficits, Normal affect Symmetrical Chest wall movement, Good air movement bilaterally, CTAB RRR,No Gallops,Rubs or new  Murmurs, No Parasternal Heave +ve B.Sounds, Abd Soft, No tenderness, No rebound - guarding or rigidity. No Cyanosis, left BKA, left upper back bandaged     Data Reviewed: I have personally reviewed following labs and imaging studies  CBC: Recent Labs  Lab 10/10/21 0103 10/12/21 0117  WBC 6.7 7.1  HGB 10.6* 11.2*  HCT 33.9* 34.7*  MCV 86.0 84.8  PLT 434* Q000111Q*   Basic Metabolic Panel: Recent Labs  Lab 10/10/21 1042 10/12/21 0117  NA 133* 134*  K 4.4 4.3  CL 95* 97*  CO2 28 30  GLUCOSE 178* 257*  BUN 20 23*  CREATININE 1.05 1.09  CALCIUM 8.9 8.6*   GFR: Estimated Creatinine Clearance: 85.9 mL/min (by C-G formula based on SCr of 1.09 mg/dL). Liver Function Tests: No results for input(s): AST, ALT, ALKPHOS, BILITOT, PROT, ALBUMIN in the last 168 hours.  No results for input(s): LIPASE, AMYLASE in the last 168 hours. No results for input(s): AMMONIA in the last 168 hours. Coagulation Profile: No results for input(s): INR, PROTIME in the last 168 hours.  Cardiac Enzymes: No results for input(s): CKTOTAL, CKMB, CKMBINDEX, TROPONINI in the last 168 hours. BNP (last 3 results) No results for input(s): PROBNP in the last 8760 hours. HbA1C: No results for input(s): HGBA1C in the last 72 hours. CBG: Recent Labs  Lab 10/15/21 0833 10/15/21 1125 10/15/21 1632 10/15/21 2041 10/16/21 0845  GLUCAP 180* 157* 162* 157* 167*   Lipid Profile: No results for input(s): CHOL, HDL, LDLCALC, TRIG, CHOLHDL, LDLDIRECT in the last 72 hours. Thyroid Function Tests: No results for input(s): TSH, T4TOTAL, FREET4, T3FREE, THYROIDAB in the last 72 hours. Anemia Panel: No results for input(s): VITAMINB12, FOLATE, FERRITIN, TIBC, IRON, RETICCTPCT in the last 72 hours. Sepsis Labs: No results for input(s): PROCALCITON, LATICACIDVEN in the last 168 hours.   Recent Results (from the past 240 hour(s))  Aerobic/Anaerobic Culture w Gram Stain (surgical/deep wound)     Status: None    Collection Time: 10/06/21  4:57 PM   Specimen: PATH Other; Tissue  Result Value Ref Range Status   Specimen Description ABSCESS  Final   Special Requests BACK ABCESS DRAINAGE SPEC A  Final   Gram Stain   Final    RARE WBC PRESENT, PREDOMINANTLY MONONUCLEAR RARE GRAM POSITIVE COCCI IN PAIRS    Culture   Final    MODERATE STAPHYLOCOCCUS AUREUS SUSCEPTIBILITIES PERFORMED ON PREVIOUS CULTURE WITHIN THE LAST 5 DAYS. NO ANAEROBES ISOLATED Performed at [facility], [place] [address], [place], [place] [address]    Report Status 10/11/2021 FINAL  Final  Aerobic/Anaerobic Culture w Gram Stain (surgical/deep wound)     Status: None   Collection Time: 10/06/21  4:59 PM   Specimen: PATH Other; Tissue  Result Value Ref Range Status   Specimen Description ABSCESS  Final   Special Requests BACK ABCESS TISSUE  Final   Gram Stain   Final    RARE WBC PRESENT,BOTH PMN AND MONONUCLEAR RARE GRAM POSITIVE COCCI    Culture   Final    MODERATE METHICILLIN RESISTANT STAPHYLOCOCCUS AUREUS NO ANAEROBES ISOLATED Performed at [facility], [place]  [address] [place], [place] [address]    Report Status 10/11/2021 FINAL  Final   Organism ID, Bacteria METHICILLIN RESISTANT STAPHYLOCOCCUS AUREUS  Final      Susceptibility   Methicillin resistant staphylococcus aureus - MIC*    CIPROFLOXACIN <=0.5 SENSITIVE Sensitive     ERYTHROMYCIN >=8 RESISTANT Resistant     GENTAMICIN <=0.5 SENSITIVE Sensitive     OXACILLIN >=4 RESISTANT Resistant     TETRACYCLINE <=1 SENSITIVE Sensitive     VANCOMYCIN 1 SENSITIVE Sensitive     TRIMETH/SULFA <=10 SENSITIVE Sensitive     CLINDAMYCIN <=0.25 SENSITIVE Sensitive     RIFAMPIN <=0.5 SENSITIVE Sensitive     Inducible Clindamycin NEGATIVE Sensitive     * MODERATE METHICILLIN RESISTANT STAPHYLOCOCCUS AUREUS         Radiology Studies: No results found.      Scheduled Meds:  acetaminophen  1,000 mg Oral Q6H   amLODipine  5 mg Oral Daily   aspirin  81 mg  Oral Daily   atorvastatin  10 mg Oral Daily   cefadroxil  500 mg Oral BID   doxycycline  100 mg Oral Q12H   DULoxetine  30 mg Oral Daily   enoxaparin (LOVENOX) injection  40 mg Subcutaneous Q24H   gabapentin  400 mg Oral TID   insulin aspart  0-20 Units Subcutaneous TID WC   insulin aspart  0-5 Units Subcutaneous QHS   insulin aspart protamine- aspart  16 Units Subcutaneous BID WC   metoprolol tartrate  12.5 mg Oral BID   pantoprazole  40 mg Oral Daily   pneumococcal 23 valent vaccine  0.5 mL Intramuscular Tomorrow-1000   polyethylene glycol  17 g Oral BID   senna-docusate  2 tablet Oral BID   sodium chloride flush  10-40 mL Intracatheter Q12H   sodium chloride flush  3 mL Intravenous Q12H   Continuous Infusions:     LOS: 13 days       Phillips Climes, MD [facility]   If 7PM-7AM, please contact night-coverage [email or web address]  10/16/2021, 12:05 PM  Patient ID: Brett White, male   DOB: [birth year], 61 y.o.   MRN: [id number] Patient ID: Brett White, male   DOB: Aug 04, 1961, 61 y.o.   MRN: [id number]  patient ID: Brett White, male   DOB: 1960/12/07, 61 y.o.   MRN: [id number] Patient ID: Brett White, male   DOB: [birth year], 61 y.o.   MRN: [id number] Patient ID: Brett White, male   DOB: 1961-04-18, 61 y.o.   MRN: [id number] Patient ID: Brett White, male   DOB: 1961-03-09, 61 y.o.   MRN: [id number]

## 2021-10-17 LAB — GLUCOSE, CAPILLARY
Glucose-Capillary: 168 mg/dL — ABNORMAL HIGH (ref 70–99)
Glucose-Capillary: 152 mg/dL — ABNORMAL HIGH (ref 70–99)
Glucose-Capillary: 167 mg/dL — ABNORMAL HIGH (ref 70–99)
Glucose-Capillary: 131 mg/dL — ABNORMAL HIGH (ref 70–99)

## 2021-10-17 NOTE — Progress Notes (Signed)
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  The patient reported noticing some pain and swelling near the left scapula approximately 2 days prior to his presentation.  He thought he might have been bit by a spider.  He was going to be discharged home but developed fever and tachycardia. -Work-up significant for abscess and surrounding cellulitis, status post I&D 1/6, improving, sepsis has resolved, transition to oral antibiotics, no further need of debridement, he is requiring wound dressing changes, awaiting SNF insurance authorization.   Assessment & Plan:   Principal Problem:   Cellulitis Active Problems:   Uncontrolled type 2 diabetes mellitus with hyperglycemia (HCC)   Back abscess   AKI (acute kidney injury) ([employer])   1-Abscess and cellulitis of the back near the left scapula, Sepsis; -Patient had I&D performed in the ED.  He received 1 dose of IV dalbavancin. Subsequently was admitted by triad. -Patient underwent excision and debridement of left upper back subcutaneous abscess and phlegmon in the OR on 1/6 -Wound growing staph -Antibiotics management per ID, initially on doxycycline, but given extensive cellulitis cefadroxil has been added, for 2 weeks treatment, continue with antibiotic treatment until 10/22/2021(both doxycycline and cefadroxil) -Input greatly appreciated, recommendation for local wound care and dressing.  No need for further debridement.  2-insulin-dependent type 2 diabetes, uncontrolled with hyperglycemia A1c 12.2 -Patient reported that he is not able to afford insulin only taking metformin -CBG currently well controlled,  he had couple of low readings yesterday, so I have lowered his 7030 to 18 units twice daily.   -Continue with sliding scale insulin  Peripheral neuropathy: Need better diabetes control  AKI from dehydration and sepsis Continue to hold Cozaar Continue with hydration Cr peak 1.6 Cr down to 1.4---1.0  Hyponatremia: Improved with IV fluids.  HTN: Continue to hold Cozaar due to AKI Start Norvasc 1/10.  PAD: Continue with aspirin and statins Right leg ABI noncompressible artery Will need follow-up with vascular surgery  History of left BKA by Dr. 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  Constipation: KUB showed constipation Continue with laxative  Depression: Continue with Cymbalta  FFT, poor social support.  PT consulted Needs SNF placement.     Estimated body mass index is 26.96 kg/m as calculated from the following:   Height as of this encounter: 6\' 2"  (1.88 m).   Weight as of this encounter: 95.3 kg.   DVT prophylaxis: Lovenox Code Status: Full code Family Communication: care discussed with patient, none at bedside Disposition Plan:  Status is: Inpatient  Patient is medically stable for discharge, awaiting SNF bed availability/insurance authorization      Consultants:  General surgery  Infectious disease  Procedures:  I and D 1-6    Subjective:  No significant events overnight, he reports pain at surgical site.     Objective: Vitals:   10/16/21 2333 10/17/21 0329 10/17/21 0728 10/17/21 1207  BP: (!) 154/85 124/72 129/73 (!) 150/89  Pulse: 75 67 65 69  Resp: 18 18 15 17   Temp: 97.6 F (36.4 C) 97.8 F (36.6 C) 97.6 F (36.4 C) 97.8 F (36.6 C)  TempSrc: Oral Oral Oral Oral  SpO2: 94% 92% 95% 97%  Weight:      Height:        Intake/Output Summary (Last 24 hours) at 10/17/2021 1421 Last data filed at 10/17/2021 0940 Gross per 24 hour  Intake 480 ml  Output 1125 ml  Net -645 ml   Filed Weights   10/03/21 1317  10/06/21 1547  Weight: 95.3 kg 95.3 kg    Examination:  Awake Alert, Oriented X 3, No new F.N deficits, Normal affect Symmetrical Chest wall movement, Good air movement bilaterally, CTAB RRR,No Gallops,Rubs or new Murmurs, No Parasternal Heave +ve B.Sounds, Abd Soft, No tenderness, No rebound - guarding or rigidity. No Cyanosis, left BKA, left upper back bandaged     Data Reviewed: I have personally reviewed following labs and imaging studies  CBC: Recent Labs  Lab 10/12/21 0117  WBC 7.1  HGB 11.2*  HCT 34.7*  MCV 84.8  PLT Q000111Q*   Basic Metabolic Panel: Recent Labs  Lab 10/12/21 0117  NA 134*  K 4.3  CL 97*  CO2 30  GLUCOSE 257*  BUN 23*  CREATININE 1.09  CALCIUM 8.6*   GFR: Estimated Creatinine Clearance: 85.9 mL/min (by C-G formula based on SCr of 1.09 mg/dL). Liver Function Tests: No results for input(s): AST, ALT, ALKPHOS, BILITOT, PROT, ALBUMIN in the last 168 hours.  No results for input(s): LIPASE, AMYLASE in the last 168 hours. No results for input(s): AMMONIA in the last 168 hours. Coagulation Profile: No results for input(s): INR, PROTIME in the last 168 hours.  Cardiac Enzymes: No results for input(s): CKTOTAL, CKMB, CKMBINDEX, TROPONINI in the last 168 hours. BNP (last 3 results) No results for input(s): PROBNP in the last 8760 hours. HbA1C: No results for input(s): HGBA1C in the last 72 hours. CBG: Recent Labs  Lab 10/16/21 1228 10/16/21 1645 10/16/21 2040 10/17/21 0727 10/17/21 1206  GLUCAP 143* 148* 128* 168* 152*   Lipid Profile: No results for input(s): CHOL, HDL, LDLCALC, TRIG, CHOLHDL, LDLDIRECT in the last 72 hours. Thyroid Function Tests: No results for input(s): TSH, T4TOTAL, FREET4, T3FREE, THYROIDAB in the last 72 hours. Anemia Panel: No results for input(s): VITAMINB12, FOLATE, FERRITIN, TIBC, IRON, RETICCTPCT in the last 72 hours. Sepsis Labs: No results for input(s): PROCALCITON, LATICACIDVEN in the last 168  hours.   No results found for this or any previous visit (from the past 240 hour(s)).        Radiology Studies: No results found.      Scheduled Meds:  acetaminophen  1,000 mg Oral Q6H   amLODipine  5 mg Oral Daily   aspirin  81 mg Oral Daily   atorvastatin  10 mg Oral Daily   cefadroxil  500 mg Oral BID   doxycycline  100 mg Oral Q12H   DULoxetine  30 mg Oral Daily   enoxaparin (LOVENOX) injection  40 mg Subcutaneous Q24H   gabapentin  400 mg Oral TID   insulin aspart  0-20 Units Subcutaneous TID WC   insulin aspart  0-5 Units Subcutaneous QHS   insulin aspart protamine- aspart  16 Units Subcutaneous BID WC   metoprolol tartrate  12.5 mg Oral BID   pantoprazole  40 mg Oral Daily   pneumococcal 23 valent vaccine  0.5 mL Intramuscular Tomorrow-1000   polyethylene glycol  17 g Oral BID   senna-docusate  2 tablet Oral BID   sodium chloride flush  3 mL Intravenous Q12H   Continuous Infusions:     LOS: 14 days       [employer],  MD [facility]   If 7PM-7AM, please contact night-coverage [email or web address]  10/17/2021, 2:21 PM  Patient ID: Brett White, male   DOB: 10/11/1960, 61 y.o.   MRN: [id number] Patient ID: Brett White, male   DOB: 1960/10/17, 61 y.o.   MRN: [id number]  patient ID: Brett White, male   DOB: [birth year], 61 y.o.   MRN: [id number] Patient ID: Brett White, male   DOB: [birth year], 61 y.o.   MRN: [id number] Patient ID: Brett White, male   DOB: [birth year], 61 y.o.   MRN: [id number] Patient ID: Brett White, male   DOB: 06/22/1961, 61 y.o.   MRN: [id number] Patient ID: Brett White, male   DOB: [birth year], 61 y.o.   MRN: [id number]

## 2021-10-17 NOTE — TOC Progression Note (Signed)
Transition of Care [facility]) - Progression Note    Patient Details  Name: Brett White MRN: [id number] Date of Birth: 04-13-1961  Transition of Care [facility]) CM/SW Contact  Mearl Latin, LCSW Phone Number: 10/17/2021, 4:18 PM  Clinical Narrative:    Insurance authorization still pending per [employer].    Expected Discharge Plan: Skilled Nursing Facility Barriers to Discharge: SNF Pending bed offer, Continued Medical Work up, English as a second language teacher  Expected Discharge Plan and Services Expected Discharge Plan: Skilled Nursing Facility In-house Referral: Clinical Social Work   Post Acute Care Choice: Skilled Nursing Facility Living arrangements for the past 2 months: Apartment                                       Social Determinants of Health (SDOH) Interventions    Readmission Risk Interventions No flowsheet data found.

## 2021-10-18 LAB — GLUCOSE, CAPILLARY
Glucose-Capillary: 117 mg/dL — ABNORMAL HIGH (ref 70–99)
Glucose-Capillary: 196 mg/dL — ABNORMAL HIGH (ref 70–99)
Glucose-Capillary: 146 mg/dL — ABNORMAL HIGH (ref 70–99)
Glucose-Capillary: 149 mg/dL — ABNORMAL HIGH (ref 70–99)

## 2021-10-18 MED ORDER — ACETAMINOPHEN 500 MG PO TABS
1000.0000 mg | ORAL_TABLET | Freq: Three times a day (TID) | ORAL | Status: DC
  Administered 2021-10-19 – 2021-10-20 (×4): 1000 mg via ORAL
  Filled 2021-10-18 (×5): qty 2

## 2021-10-18 NOTE — TOC Progression Note (Signed)
Transition of Care [facility]) - Progression Note    Patient Details  Name: Brett White MRN: [id number] Date of Birth: 1961/08/28  Transition of [facility]) CM/SW Contact  Mearl Latin, LCSW Phone Number: 10/18/2021, 12:06 PM  Clinical Narrative:    Per Lacinda Axon, [employer] reported they are hopeful to have a decision today. Lacinda Axon coming to see patient today for consents. CSW requested COVID test.    Expected Discharge Plan: Skilled Nursing Facility Barriers to Discharge: SNF Pending bed offer, Continued Medical Work up, English as a second language teacher  Expected Discharge Plan and Services Expected Discharge Plan: Skilled Nursing Facility In-house Referral: Clinical Social Work   Post Acute Care Choice: Skilled Nursing Facility Living arrangements for the past 2 months: Apartment                                       Social Determinants of Health (SDOH) Interventions    Readmission Risk Interventions No flowsheet data found.

## 2021-10-18 NOTE — TOC Progression Note (Signed)
Transition of Care [facility]) - Progression Note    Patient Details  Name: Lenus Trauger MRN: [id number] Date of Birth: [birth year]  Transition of Care [facility]) CM/SW Contact  Baldemar Lenis, [place] Phone Number: 10/18/2021, 4:18 PM  Clinical Narrative:   CSW received call from [place] that [facility] was asking for additional clinical information. CSW sent updated MD note, and then received a call back from [place] that [facility] was offering a peer to peer. CSW sent peer to peer information to MD. CSW to follow.    Expected Discharge Plan: Skilled Nursing Facility Barriers to Discharge: SNF Pending bed offer, Continued Medical Work up, English as a second language teacher  Expected Discharge Plan and Services Expected Discharge Plan: Skilled Nursing Facility In-house Referral: Clinical Social Work   Post Acute Care Choice: Skilled Nursing Facility Living arrangements for the past 2 months: Apartment                                       Social Determinants of Health (SDOH) Interventions    Readmission Risk Interventions No flowsheet data found.

## 2021-10-18 NOTE — Progress Notes (Signed)
Physical Therapy Treatment Patient Details Name: Brett White MRN: [id number] DOB: [birth year] Today's Date: 10/18/2021   History of Present Illness 61 y.o. male presents to [facility] hospital on 10/03/2021 with progressive pain and swelling near left scapula, admitted for management of cellulitis. Pt underwent L shoulder I&D abscess. Repeat I&D 10/06/21; 1/9 MRI shoulder pending  Pt also with hyponatremia. PMH includes DMII, HTN, L BKA.    PT Comments    Pt sitting EOB with c/o L scapular pain.  Asked for pain meds prior to mobility.  Emphasis on sit to stand technique and progression of "swing to" gait patter stability, stamina and overall quality.  Pt sat EOB and showed some of the LE exercises he had been shown.    Recommendations for follow up therapy are one component of a multi-disciplinary discharge planning process, led by the attending physician.  Recommendations may be updated based on patient status, additional functional criteria and insurance authorization.  Follow Up Recommendations  Skilled nursing-short term rehab (<3 hours/day)     Assistance Recommended at Discharge Intermittent Supervision/Assistance  Patient can return home with the following A little help with walking and/or transfers;A little help with bathing/dressing/bathroom;Assistance with cooking/housework;Assist for transportation   Equipment Recommendations  None recommended by PT    Recommendations for Other Services       Precautions / Restrictions Precautions Precautions: Fall Precaution Comments: old L BKA     Mobility  Bed Mobility           Sit to supine: Modified independent (Device/Increase time)        Transfers Overall transfer level: Needs assistance Equipment used: Rolling walker (2 wheels) Transfers: Sit to/from Stand Sit to Stand: Min guard           General transfer comment: used UE's and RW appropriately    Ambulation/Gait Ambulation/Gait assistance: Min guard Gait  Distance (Feet): 140 Feet Assistive device: Rolling walker (2 wheels) Gait Pattern/deviations: Step-to pattern   Gait velocity interpretation: <1.8 ft/sec, indicate of risk for recurrent falls   General Gait Details: Generally controlled "swing to" gait pattern with minor wobbliness with stance on 1 LE during advancing the RW.  Occasional stops to unweight painful L shoulder.   Stairs             Wheelchair Mobility    Modified Rankin (Stroke Patients Only)       Balance Overall balance assessment: Needs assistance Sitting-balance support: Feet supported Sitting balance-Leahy Scale: Good     Standing balance support: Bilateral upper extremity supported, During functional activity Standing balance-Leahy Scale: Poor Standing balance comment: relies on walker for steadiness but at fair balance with it                            Cognition Arousal/Alertness: Awake/alert Behavior During Therapy: WFL for tasks assessed/performed, Flat affect Overall Cognitive Status: Within Functional Limits for tasks assessed                                 General Comments: 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 - Lower Extremity Long Arc Quad: AROM, Left, 10 reps, Seated Hip Flexion/Marching: AROM, Left, 5 reps, Seated    General Comments        Pertinent Vitals/Pain Pain Assessment Pain Assessment: Faces Faces Pain Scale: Hurts even more Pain Location: L  scapula Pain Descriptors / Indicators: Throbbing Pain Intervention(s): Patient requesting pain meds-RN notified, Monitored during session    Home Living                          Prior Function            PT Goals (current goals can now be found in the care plan section) Acute Rehab PT Goals PT Goal Formulation: With patient Time For Goal Achievement: 10/19/21 Potential to Achieve Goals: Good Progress towards PT goals: Progressing toward  goals    Frequency    Min 3X/week      PT Plan Current plan remains appropriate    Co-evaluation              AM-PAC PT "6 Clicks" Mobility   Outcome Measure  Help needed turning from your back to your side while in a flat bed without using bedrails?: None Help needed moving from lying on your back to sitting on the side of a flat bed without using bedrails?: None Help needed moving to and from a bed to a chair (including a wheelchair)?: A Little Help needed standing up from a chair using your arms (e.g., wheelchair or bedside chair)?: A Little Help needed to walk in hospital room?: A Little Help needed climbing 3-5 steps with a railing? : A Lot 6 Click Score: 19    End of Session   Activity Tolerance: Patient limited by fatigue Patient left: in bed;with call bell/phone within reach;with bed alarm set Nurse Communication: Mobility status PT Visit Diagnosis: Other abnormalities of gait and mobility (R26.89);Muscle weakness (generalized) (M62.81);Pain Pain - Right/Left: Left Pain - part of body: Shoulder     Time: [id number] PT Time Calculation (min) (ACUTE ONLY): 21 min  Charges:  $Gait Training: 8-22 mins                     10/18/2021  Brett White., PT Acute Rehabilitation Services [phone]  (pager) [phone]  (office)   Brett White Brett White 10/18/2021, 2:19 PM

## 2021-10-18 NOTE — Progress Notes (Addendum)
PROGRESS NOTE    Brett White  [id number] DOB: 1961/06/09 DOA: 10/03/2021 PCP: Arthur Holms, NP   Brief Narrative: 61 year old with history of poorly controlled DM-2, left BKA, HTN-who presented with left scapular abscess.    Subjective:  Pain at the left scapular site.  Lying comfortably in bed.  Objective: Blood pressure (!) 149/77, pulse 65, temperature (!) 97.2 F (36.2 C), temperature source Axillary, resp. rate 11, height 6\' 2"  (1.88 m), weight 95.3 kg, SpO2 97 %.   Gen Exam:Alert awake-not in any distress HEENT:atraumatic, normocephalic Chest: B/L clear to auscultation anteriorly CVS:S1S2 regular Abdomen:soft non tender, non distended Extremities:no edema s/p left BKA Neurology: Non focal Skin: no rash   Assessment & Plan: Sepsis due to left scapular abscess: Sepsis physiology has resolved.  S/p I&D by general surgery on 1/6-wound cultures positive for MRSA.  Recommendations from ID are to continue doxycycline/cefadroxil until 10/22/2021.  Per general surgery-okay to discharge-does not require any further debridements.  Continue twice daily wet-to-dry dressing changes.  AKI: Hemodynamically mediated-resolved.  Hyponatremia: Mild-doubt any clinical significance.  Watch periodically.  HTN: BP controlled-continue Norvasc  PAD: Stable-continue aspirin/statin  History of recent left BKA: Stump appears benign  DM-2 (A1c 12.2 on 1/4): CBGs well-controlled-continue insulin 70/30 16 units twice daily and SSI.  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  Constipation: Continue bowel regimen with Senokot and MiraLAX.    Depression: Continue with Cymbalta  BMI: Estimated body mass index is 26.96 kg/m as calculated from the following:   Height as of this encounter: 6'  2" (1.88 m).   Weight as of this encounter: 95.3 kg.   DVT prophylaxis: Lovenox Code Status: Full code Family Communication: None at bedside. Disposition Plan:  Status is: Inpatient  Patient is medically stable for discharge, awaiting SNF bed availability/insurance authorization  Consultants:  General surgery  Infectious disease  Procedures:  I&D 1-6  Data Reviewed: I have personally reviewed following labs and imaging studies  CBC: Recent Labs  Lab 10/12/21 0117  WBC 7.1  HGB 11.2*  HCT 34.7*  MCV 84.8  PLT 455*    Basic Metabolic Panel: Recent Labs  Lab 10/12/21 0117  NA 134*  K 4.3  CL 97*  CO2 30  GLUCOSE 257*  BUN 23*  CREATININE 1.09  CALCIUM 8.6*    GFR: Estimated Creatinine Clearance: 85.9 mL/min (by C-G formula based on SCr of 1.09 mg/dL). Liver Function Tests: No results for input(s): AST, ALT, ALKPHOS, BILITOT, PROT, ALBUMIN in the last 168 hours.  No results for input(s): LIPASE, AMYLASE in the last 168 hours. No results for input(s): AMMONIA in the last 168 hours. Coagulation Profile: No results for input(s): INR, PROTIME in the last 168 hours.  Cardiac Enzymes: No results for input(s): CKTOTAL, CKMB, CKMBINDEX, TROPONINI in the last 168 hours. BNP (last 3 results) No results for input(s): PROBNP in the last 8760 hours. HbA1C: No results for input(s): HGBA1C in the last 72 hours. CBG: Recent Labs  Lab 10/17/21 0727 10/17/21 1206 10/17/21 1552 10/17/21 2100 10/18/21 0738  GLUCAP 168* 152* 167* 131* 117*    Lipid Profile: No results for input(s): CHOL, HDL, LDLCALC, TRIG, CHOLHDL, LDLDIRECT in the last 72 hours. Thyroid Function Tests: No results for input(s): TSH, T4TOTAL, FREET4, T3FREE, THYROIDAB in the last 72 hours. Anemia Panel:  No results for input(s): VITAMINB12, FOLATE, FERRITIN, TIBC, IRON, RETICCTPCT in the last 72 hours. Sepsis Labs: No results for input(s): PROCALCITON, LATICACIDVEN in the last 168 hours.   No  results found for this or any previous visit (from the past 240 hour(s)).        Radiology Studies: No results found.      Scheduled Meds:  acetaminophen  1,000 mg Oral Q6H   amLODipine  5 mg Oral Daily   aspirin  81 mg Oral Daily   atorvastatin  10 mg Oral Daily   cefadroxil  500 mg Oral BID   doxycycline  100 mg Oral Q12H   DULoxetine  30 mg Oral Daily   enoxaparin (LOVENOX) injection  40 mg Subcutaneous Q24H   gabapentin  400 mg Oral TID   insulin aspart  0-20 Units Subcutaneous TID WC   insulin aspart  0-5 Units Subcutaneous QHS   insulin aspart protamine- aspart  16 Units Subcutaneous BID WC   metoprolol tartrate  12.5 mg Oral BID   pantoprazole  40 mg Oral Daily   pneumococcal 23 valent vaccine  0.5 mL Intramuscular Tomorrow-1000   polyethylene glycol  17 g Oral BID   senna-docusate  2 tablet Oral BID   sodium chloride flush  3 mL Intravenous Q12H   Continuous Infusions:     LOS: 15 days       Oren Binet, MD [facility]   If 7PM-7AM, please contact night-coverage [email or web address]  10/18/2021, 9:50 AM  Patient ID: Brett White, male   DOB: 16-Dec-1960, 61 y.o.   MRN: [id number] Patient ID: Brett White, male   DOB: 09-20-1961, 61 y.o.   MRN: [id number]  patient ID: Brett White, male   DOB: [birth year], 61 y.o.   MRN: [id number] Patient ID: Brett White, male   DOB: May 25, 1961, 62 y.o.   MRN: [id number] Patient ID: Brett White, male   DOB: [birth year], 61 y.o.   MRN: [id number] Patient ID: Brett White, male   DOB: 1961-01-07, 61 y.o.   MRN: [id number] Patient ID: Brett White, male   DOB: 03-21-1961, 61 y.o.   MRN: [id number]

## 2021-10-19 LAB — GLUCOSE, CAPILLARY
Glucose-Capillary: 204 mg/dL — ABNORMAL HIGH (ref 70–99)
Glucose-Capillary: 119 mg/dL — ABNORMAL HIGH (ref 70–99)
Glucose-Capillary: 195 mg/dL — ABNORMAL HIGH (ref 70–99)
Glucose-Capillary: 162 mg/dL — ABNORMAL HIGH (ref 70–99)

## 2021-10-19 LAB — RESP PANEL BY RT-PCR (FLU A&B, COVID) ARPGX2
Influenza A by PCR: NEGATIVE
Influenza B by PCR: NEGATIVE
SARS Coronavirus 2 by RT PCR: NEGATIVE

## 2021-10-19 MED ORDER — ACETAMINOPHEN 500 MG PO TABS: 1000.0000 mg | ORAL_TABLET | Freq: Three times a day (TID) | ORAL | 0 refills | Status: DC | PRN

## 2021-10-19 MED ORDER — OXYCODONE HCL 5 MG PO TABS
10.0000 mg | ORAL_TABLET | ORAL | Status: DC | PRN
  Administered 2021-10-19 – 2021-10-20 (×5): 15 mg via ORAL
  Filled 2021-10-19 (×5): qty 3

## 2021-10-19 MED ORDER — AMLODIPINE BESYLATE 5 MG PO TABS: 5.0000 mg | ORAL_TABLET | Freq: Every day | ORAL | Status: DC

## 2021-10-19 MED ORDER — SENNOSIDES-DOCUSATE SODIUM 8.6-50 MG PO TABS: 2.0000 | ORAL_TABLET | Freq: Two times a day (BID) | ORAL | Status: DC

## 2021-10-19 MED ORDER — OXYCODONE HCL 10 MG PO TABS: 10.0000 mg | ORAL_TABLET | Freq: Four times a day (QID) | ORAL | 0 refills | Status: DC | PRN

## 2021-10-19 MED ORDER — DOXYCYCLINE HYCLATE 100 MG PO CAPS: 100.0000 mg | ORAL_CAPSULE | Freq: Two times a day (BID) | ORAL | 0 refills | Status: DC

## 2021-10-19 MED ORDER — CEFADROXIL 500 MG PO CAPS: 500.0000 mg | ORAL_CAPSULE | Freq: Two times a day (BID) | ORAL | 0 refills | Status: DC

## 2021-10-19 MED ORDER — INSULIN ASPART PROT & ASPART (70-30 MIX) 100 UNIT/ML ~~LOC~~ SUSP: 16.0000 [IU] | Freq: Two times a day (BID) | SUBCUTANEOUS | 11 refills | Status: DC

## 2021-10-19 MED ORDER — METOPROLOL TARTRATE 25 MG PO TABS: 12.5000 mg | ORAL_TABLET | Freq: Two times a day (BID) | ORAL | Status: DC

## 2021-10-19 MED ORDER — NOVOLOG FLEXPEN 100 UNIT/ML ~~LOC~~ SOPN: PEN_INJECTOR | SUBCUTANEOUS | 0 refills | Status: DC

## 2021-10-19 MED ORDER — POLYETHYLENE GLYCOL 3350 17 G PO PACK: 17.0000 g | PACK | Freq: Two times a day (BID) | ORAL | 0 refills | Status: DC

## 2021-10-19 MED ORDER — HYDROMORPHONE HCL 1 MG/ML IJ SOLN
0.5000 mg | Freq: Four times a day (QID) | INTRAMUSCULAR | Status: DC | PRN
  Administered 2021-10-19: 0.5 mg via INTRAVENOUS
  Filled 2021-10-19: qty 0.5

## 2021-10-19 MED ORDER — GABAPENTIN 400 MG PO CAPS: 400.0000 mg | ORAL_CAPSULE | Freq: Three times a day (TID) | ORAL | 0 refills | Status: DC

## 2021-10-19 NOTE — TOC Transition Note (Signed)
Transition of Care [facility]) - CM/SW Discharge Note   Patient Details  Name: Brett White MRN: [id number] Date of Birth: Jul 31, 1961  Transition of Care [facility]) CM/SW Contact:  Benard Halsted, LCSW Phone Number: 10/19/2021, 12:02 PM   Clinical Narrative:    Patient will DC to: [facility] Anticipated DC date: 10/19/21 Family notified: Pt declined Transport by: Corey Harold    Per MD patient ready for DC to [place]. RN to call report prior to discharge [phone] Room 211A). RN, patient, patient's family, and facility notified of DC. Discharge Summary and FL2 sent to facility. DC packet on chart. Ambulance transport requested for patient.   CSW will sign off for now as social work intervention is no longer needed. Please consult [place] again if new needs arise.     Final next level of care: Skilled Nursing Facility Barriers to Discharge: Barriers Resolved   Patient Goals and CMS Choice Patient states their goals for this hospitalization and ongoing recovery are:: Rehab CMS Medicare.gov Compare Post Acute Care list provided to:: Patient Represenative (must comment) (pt given medicare.gov website) Choice offered to / list presented to : Patient  Discharge Placement   Existing PASRR number confirmed : 10/19/21          Patient [facility] bed at: [facility] Patient to be transferred to facility by: [place] Name of family member notified: Pt declined Patient and family notified of of transfer: 10/19/21  Discharge Plan and Services In-house Referral: Clinical Social Work   Post Acute Care Choice: [place]                               Social Determinants of Health (SDOH) Interventions     Readmission Risk Interventions No flowsheet data found.

## 2021-10-19 NOTE — TOC Progression Note (Signed)
Transition of Care [facility]) - Progression Note    Patient Details  Name: Brett White MRN: [id number] Date of Birth: [birth year]  Transition of Care [facility]) CM/SW Contact  Mearl Latin, LCSW Phone Number: 10/19/2021, 11:52 AM  Clinical Narrative:    CSW spoke with patient about insurance approval. He is in agreement with discharge to [place] today Lacinda Axon had attempted to admit patient from his home but was unable to get insurance approval at that time). Lacinda Axon updated patient's Child psychotherapist at [employer].    Expected Discharge Plan: Skilled Nursing Facility Barriers to Discharge: SNF Pending bed offer, Continued Medical Work up, English as a second language teacher  Expected Discharge Plan and Services Expected Discharge Plan: Skilled Nursing Facility In-house Referral: Clinical Social Work   Post Acute Care Choice: Skilled Nursing Facility Living arrangements for the past 2 months: Apartment Expected Discharge Date: 10/19/21                                     Social Determinants of Health (SDOH) Interventions    Readmission Risk Interventions No flowsheet data found.

## 2021-10-19 NOTE — TOC Progression Note (Signed)
Transition of Care [facility]) - Progression Note    Patient Details  Name: Casey Sadlowski MRN: [id number] Date of Birth: 1961/06/29  Transition of Care [facility]) CM/SW Contact  Cyndi Bender, RN Phone Number: 10/19/2021, 3:50 PM  Clinical Narrative:    Orders for home health RN/PT/OT. Patient has used [facility] in the past and would like to use them again. Spoke to [place] with [facility] and referral accepted.  Patient states his friend can pick him up tomorrow once discharged. He will go get new prothesis. He has a walker and wheelchair already. TOC will continue to follow.    Expected Discharge Plan: [place] Barriers to Discharge: Continued Medical Work up  Expected Discharge Plan and Services Expected Discharge Plan: [place] In-house Referral: Clinical Social Work Discharge Planning Services: CM Consult Post Acute Care Choice: [facility] arrangements for the past 2 months: Apartment Expected Discharge Date: 10/19/21                         HH Arranged: RN, PT, OT HH Agency: [facility] Date [facility] Agency Contacted: 10/19/21 Time HH Agency Contacted: [phone] Representative spoke with at [place]: [place] ([place]) Interventions    Readmission Risk Interventions No flowsheet data found.

## 2021-10-19 NOTE — Discharge Summary (Addendum)
PATIENT DETAILS Name: Brett White Age: 61 y.o. Sex: male Date of Birth: 1961/04/10 MRN: [id number]. Admitting Physician: Vianne Bulls, MD [id number], Maudie Mercury, NP  Admit Date: 10/03/2021 Discharge date: 10/20/2021  Recommendations for Outpatient Follow-up:  Follow up with PCP in 1-2 weeks Please obtain CMP/CBC in one week   Admitted From:  Home  Disposition: SNF declined patient-being discharged home with home health services.   Home Health: No  Equipment/Devices: None  Discharge Condition: Stable  CODE STATUS: FULL CODE  Diet recommendation:  Diet Order             Diet - low sodium heart healthy           Diet Carb Modified           Diet Carb Modified Fluid consistency: Thin; Room service appropriate? Yes  Diet effective now                    Brief Summary: 61 year old with history of poorly controlled DM-2, left BKA, HTN-who presented with left scapular abscess.    Brief Hospital Course: Sepsis due to left scapular abscess: Sepsis physiology has resolved.  S/p I&D by general surgery on 1/6-wound cultures positive for MRSA.  Recommendations from ID are to continue doxycycline/cefadroxil until 10/22/2021.  Per general surgery-okay to discharge-does not require any further debridements.  Continue twice daily wet-to-dry dressing changes.  Please ensure follow-up with general surgery.   AKI: Hemodynamically mediated-resolved.   Hyponatremia: Mild-doubt any clinical significance.  Watch periodically.   HTN: BP controlled-continue Norvasc   PAD: Stable-continue aspirin/statin   History of recent left BKA: Stump appears benign   DM-2 (A1c 12.2 on 1/4): CBGs well-controlled-continue insulin 70/30 16 units twice daily and SSI.  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   Constipation: Continue bowel regimen with Senokot and MiraLAX.      Depression: Continue with Cymbalta   BMI: Estimated body mass index is 26.96 kg/m as calculated from the following:   Height as of this encounter: 6\' 2"  (1.88 m).   Weight as of this encounter: 95.3 kg.  Procedures I&D 1-6  Discharge Diagnoses:  Principal Problem:   Cellulitis Active Problems:   Uncontrolled type 2 diabetes mellitus with hyperglycemia (HCC)   Back abscess   AKI (acute kidney injury) [facility])   Discharge Instructions:  Activity:  As tolerated with Full fall precautions use walker/cane & assistance as needed   Discharge Instructions     Call MD for:  redness, tenderness, or signs of infection (pain, swelling, redness, odor or green/yellow discharge around incision site)   Complete by: As directed    Diet - low sodium heart healthy   Complete by: As directed    Diet Carb Modified   Complete by: As directed    Discharge instructions   Complete by: As directed    Check CBGs before meals and at bedtime   Follow with Primary MD  Arthur Holms, NP in 1-2 weeks  Please get a complete blood count and chemistry panel checked by your Primary MD at your next visit, and again as instructed by your Primary MD.  Get Medicines reviewed and adjusted: Please take all your medications with you for your next visit with your Primary MD  Laboratory/radiological data: Please request your Primary MD to go over all hospital tests and procedure/radiological results at the follow up, please ask your Primary  MD to get all Hospital records sent to his/her office.  In some cases, they will be blood work, cultures and biopsy results pending at the time of your discharge. Please request that your primary care M.D. follows up on these results.  Also Note the following: If you experience worsening of your admission symptoms, develop shortness of breath, life threatening emergency, suicidal or homicidal thoughts you must seek medical attention immediately by calling 911 or calling your  MD immediately  if symptoms less severe.  You must read complete instructions/literature along with all the possible adverse reactions/side effects for all the Medicines you take and that have been prescribed to you. Take any new Medicines after you have completely understood and accpet all the possible adverse reactions/side effects.   Do not drive when taking Pain medications or sleeping medications (Benzodaizepines)  Do not take more than prescribed Pain, Sleep and Anxiety Medications. It is not advisable to combine anxiety,sleep and pain medications without talking with your primary care practitioner  Special Instructions: If you have smoked or chewed Tobacco  in the last 2 yrs please stop smoking, stop any regular Alcohol  and or any Recreational drug use.  Wear Seat belts while driving.  Please note: You were cared for by a hospitalist during your hospital stay. Once you are discharged, your primary care physician will handle any further medical issues. Please note that NO REFILLS for any discharge medications will be authorized once you are discharged, as it is imperative that you return to your primary care physician (or establish a relationship with a primary care physician if you do not have one) for your post hospital discharge needs so that they can reassess your need for medications and monitor your lab values.   Discharge wound care:   Complete by: As directed    Saline moistened gauze to open back wound covered with dry gauze-twice daily.   Increase activity slowly   Complete by: As directed       Allergies as of 10/20/2021   No Known Allergies      Medication List     STOP taking these medications    doxycycline 100 MG capsule Commonly known as: VIBRAMYCIN Replaced by: doxycycline 100 MG tablet   insulin glargine 100 UNIT/ML injection Commonly known as: LANTUS   losartan 25 MG tablet Commonly known as: COZAAR   methocarbamol 500 MG tablet Commonly known as:  ROBAXIN   NovoLOG FlexPen 100 UNIT/ML FlexPen Generic drug: insulin aspart   oxyCODONE-acetaminophen 5-325 MG tablet Commonly known as: PERCOCET/ROXICET       TAKE these medications    acetaminophen 500 MG tablet Commonly known as: TYLENOL Take 2 tablets (1,000 mg total) by mouth every 8 (eight) hours as needed.   amLODipine 5 MG tablet Commonly known as: NORVASC Take 1 tablet (5 mg total) by mouth daily.   Aspirin Low Dose 81 MG chewable tablet Generic drug: aspirin Chew 1 tablet (81 mg total) by mouth daily.   atorvastatin 10 MG tablet Commonly known as: LIPITOR Take 1 tablet (10 mg total) by mouth daily.   cefadroxil 500 MG capsule Commonly known as: DURICEF Take 1 capsule (500 mg total) by mouth 2 (two) times daily for 3 days.   doxycycline 100 MG tablet Commonly known as: VIBRA-TABS Take 1 tablet (100 mg total) by mouth 2 (two) times daily for 3 days. Replaces: doxycycline 100 MG capsule   DULoxetine 30 MG capsule Commonly known as: CYMBALTA Take 1 capsule (30 mg total)  by mouth daily.   gabapentin 400 MG capsule Commonly known as: Neurontin Take 1 capsule (400 mg total) by mouth 3 (three) times daily. What changed:  medication strength how much to take when to take this   metFORMIN 500 MG tablet Commonly known as: GLUCOPHAGE Take 1 tablet (500 mg total) by mouth daily with breakfast.   metoprolol tartrate 25 MG tablet Commonly known as: LOPRESSOR Take 1/2 tablet (12.5 mg total) by mouth 2 (two) times daily.   NovoLOG Mix 70/30 FlexPen (70-30) 100 UNIT/ML FlexPen Generic drug: insulin aspart protamine - aspart Inject 16 units twice daily as directed.   Oxycodone HCl 10 MG Tabs Take 1 tablet (10 mg total) by mouth every 6 (six) hours as needed.   pantoprazole 40 MG tablet Commonly known as: PROTONIX Take 1 tablet (40 mg total) by mouth daily.   Pentips 32G X 4 MM Misc Generic drug: Insulin Pen Needle Use to inject insulin up to 4 times daily  as needed.   polyethylene glycol powder 17 GM/SCOOP powder Commonly known as: GLYCOLAX/MIRALAX Take 1 capful (17 g) by mouth 2 (two) times daily.   Senexon-S 8.6-50 MG tablet Generic drug: senna-docusate Take 2 tablets by mouth 2 (two) times daily.               Discharge Care Instructions  (From admission, onward)           Start     Ordered   10/19/21 0000  Discharge wound care:       Comments: Saline moistened gauze to open back wound covered with dry gauze-twice daily.   10/19/21 1018            Contact information for follow-up providers     [facility] Go on 10/26/2021.   Specialty: General Surgery Why: 10/26/21 at 4pm Please arrive 30 minutes prior to your appointment to check in and fill out paperwork. Bring photo ID and insurance information. Contact information: [address] [place] [phone]        Arthur Holms, NP. Schedule an appointment as soon as possible for a visit in 1 week(s).   Specialty: Nurse Practitioner Contact information: [place] [address] [phone]         [place] Follow up.   Why: home health provider Contact information: [place] [phone]             Contact information for after-discharge care     Destination     [facility] .   Service: Skilled Nursing Contact information: [address] [place] [phone]                    No Known Allergies    Consultations: General surgery, infectious disease.   Other Procedures/Studies: DG Chest 2 View  Result Date: 10/03/2021 CLINICAL DATA:  Back infection. EXAM: CHEST - 2 VIEW COMPARISON:  June 29, 2021. FINDINGS: The heart size and mediastinal contours are within normal limits. Both lungs are clear. The visualized skeletal structures are unremarkable.  IMPRESSION: No active cardiopulmonary disease. Electronically Signed   By: Marijo Conception M.D.   On: 10/03/2021 14:16   DG Abd 1 View  Result Date: 10/04/2021 CLINICAL DATA:  Nausea, vomiting, constipation. EXAM: ABDOMEN - 1 VIEW COMPARISON:  None. FINDINGS: The bowel gas pattern is normal. Mild amount of stool seen in the right colon. No  radio-opaque calculi or other significant radiographic abnormality are seen. IMPRESSION: Mild stool burden.  No abnormal bowel dilatation. Electronically Signed   By: Marijo Conception M.D.   On: 10/04/2021 15:12   MR SHOULDER LEFT W WO CONTRAST  Result Date: 10/10/2021 CLINICAL DATA:  Left upper back subcutaneous abscess status post incision and drainage on 10/06/2021. "Septic arthritis suspected, shoulder, xray done Osteomyelitis suspected, shoulder, xray done Soft tissue infection suspected, shoulder, xray done please include left upper back where the abscess is" EXAM: MRI OF THE LEFT SHOULDER WITHOUT AND WITH CONTRAST TECHNIQUE: Multiplanar, multisequence MR imaging of the left shoulder shoulder was performed before and after the administration of intravenous contrast. CONTRAST:  76mL GADAVIST GADOBUTROL 1 MMOL/ML IV SOLN COMPARISON:  None. FINDINGS: Pre and postcontrast sequences were obtained through the posterior left upper back at site of recent incision and drainage. There is a surgical defect contiguous with a underlying complex collection within the subcutaneous tissues of the upper back. Base of the defect/wound measures approximately 2.8 x 2.7 cm. There is marked soft tissue edema and enhancement. Irregular area of central non enhancement within the subcutaneous tissues underlying the surgical site (series 9, images 13-19) without a well-defined fluid collection on the STIR or T2 weighted sequences. This may represent a combination of necrotic tissue and surgical packing material. The region of inflammatory changes within the soft tissues measures approximately 22  x 3 x 12 cm (series 7, image 20; series 3, image 16). Reactive intramuscular edema within the underlying trapezius muscle. No intramuscular fluid collection. Technical Note: Despite efforts by the technologist and patient, motion artifact is present on today's exam and could not be eliminated. This reduces exam sensitivity and specificity. Rotator cuff: The supraspinatus, infraspinatus, subscapularis, and teres minor tendons are intact. Muscles: Preserved bulk and signal intensity of the rotator cuff musculature without edema, atrophy, or fatty infiltration. Biceps long head:  Intact and appropriately positioned. Acromioclavicular Joint: Mild degenerative changes of the AC joint. No significant subacromial-subdeltoid bursal fluid. Glenohumeral Joint: No joint effusion. No cartilage defect. Labrum: Grossly intact although evaluation is limited in the absence of intra-articular fluid. No paralabral cyst. Bones: No acute fracture. No dislocation. No bone marrow edema. No marrow replacing bone lesion. Other: None. IMPRESSION: 1. Postsurgical defect of the posterior left upper back at site of recent incision and drainage. There is marked surrounding soft tissue edema and enhancement compatible with cellulitis, which involves an approximately 22 x 12 cm area of tissue. Irregular area of central non-enhancement within the subcutaneous tissues underlying the surgical site without a well-defined fluid collection on the STIR or T2 weighted sequences. This may represent a combination of necrotic tissue and surgical packing material. 2. No drainable fluid collections are evident at this time. 3. Reactive intramuscular edema within the underlying trapezius muscle. No intramuscular fluid collection. 4. No evidence of septic arthritis of the left shoulder. 5. No internal derangement of the left shoulder. Electronically Signed   By: Davina Poke D.O.   On: 10/10/2021 11:06   DG Shoulder Left  Result Date: 10/07/2021 CLINICAL  DATA:  Left shoulder pain. EXAM: LEFT SHOULDER - 2+ VIEW COMPARISON:  None. FINDINGS: Tiny bone fragment along the inferior bony glenoid, likely chronic. An acute fracture is less likely. Clinical correlation is recommended. No other acute fracture. There is no dislocation. No significant arthritic changes. Mild degenerative changes of the left AC joint with spurring. The soft tissues are unremarkable. IMPRESSION: Tiny bone fragment along the inferior bony glenoid, likely  chronic. An acute fracture is less likely. Electronically Signed   By: Anner Crete M.D.   On: 10/07/2021 20:13   VAS [place] ABI WITH/WO TBI  Result Date: 10/06/2021  LOWER EXTREMITY DOPPLER STUDY Patient Name:  WILLA STOCKSTILL  Date of Exam:   10/06/2021 Medical Rec #: [id number]       Accession #:    [id number] Date of Birth: Jul 17, 1961       Patient Gender: M Patient Age:   28 years Exam Location:  [facility] Procedure:      VAS [place] ABI WITH/WO TBI Referring Phys: Annamaria Boots XU --------------------------------------------------------------------------------  Indications: PVD w/ intermittent claudication. High Risk Factors: Diabetes.  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 Comparison Study: prior Performing Technologist: Archie Patten RVS  Examination Guidelines: A complete evaluation includes at minimum, Doppler waveform signals and systolic blood pressure reading at the level of bilateral brachial, anterior tibial, and posterior tibial arteries, when vessel segments are accessible. Bilateral testing is considered an integral part of a complete examination. Photoelectric Plethysmograph (PPG) waveforms and toe systolic pressure readings are included as required and additional duplex testing as needed. Limited examinations for reoccurring indications may be performed as noted.  ABI Findings: +---------+------------------+-----+---------+--------+  Right     Rt Pressure (mmHg) Index Waveform  Comment    +---------+------------------+-----+---------+--------+  Brachial  150                      triphasic           +---------+------------------+-----+---------+--------+  PTA       196                1.31  triphasic           +---------+------------------+-----+---------+--------+  DP        224                1.49  triphasic           +---------+------------------+-----+---------+--------+  Great Toe 203                1.35                      +---------+------------------+-----+---------+--------+ +--------+------------------+-----+---------+-------+  Left     Lt Pressure (mmHg) Index Waveform  Comment  +--------+------------------+-----+---------+-------+  Brachial 147                      triphasic          +--------+------------------+-----+---------+-------+  PTA                                         bka      +--------+------------------+-----+---------+-------+  DP                                          bka      +--------+------------------+-----+---------+-------+ +-------+-----------+-----------+------------+------------+  ABI/TBI Today's ABI Today's TBI Previous ABI Previous TBI  +-------+-----------+-----------+------------+------------+  Right   1.49        1.35                                   +-------+-----------+-----------+------------+------------+  Left    bka                                                +-------+-----------+-----------+------------+------------+  Summary: Right: Resting right ankle-brachial index indicates noncompressible right lower extremity arteries. Left: Bka.  *See table(s) above for measurements and observations.  Electronically signed by Orlie Pollen on 10/06/2021 at 6:58:21 PM.    Final      TODAY-DAY OF DISCHARGE:  Subjective:   Brett White today has no headache,no chest abdominal pain,no new weakness tingling or numbness, feels much better wants to go home today.   Objective:   Blood pressure 138/79, pulse 67, temperature 98.1 F (36.7 C), temperature  source Oral, resp. rate 18, height 6\' 2"  (1.88 m), weight 95.3 kg, SpO2 94 %.  Intake/Output Summary (Last 24 hours) at 10/20/2021 1437 Last data filed at 10/20/2021 0500 Gross per 24 hour  Intake --  Output 1540 ml  Net -1540 ml   Filed Weights   10/03/21 1317 10/06/21 1547  Weight: 95.3 kg 95.3 kg    Exam: Awake Alert, Oriented *3, No new F.N deficits, Normal affect Central.AT,PERRAL Supple Neck,No JVD, No cervical lymphadenopathy appriciated.  Symmetrical Chest wall movement, Good air movement bilaterally, CTAB RRR,No Gallops,Rubs or new Murmurs, No Parasternal Heave +ve B.Sounds, Abd Soft, Non tender, No organomegaly appriciated, No rebound -guarding or rigidity. No Cyanosis, Clubbing or edema, No new Rash or bruise   PERTINENT RADIOLOGIC STUDIES: No results found.   PERTINENT LAB RESULTS: CBC: No results for input(s): WBC, HGB, HCT, PLT in the last 72 hours. CMET CMP     Component Value Date/Time   NA 134 (L) 10/12/2021 0117   K 4.3 10/12/2021 0117   CL 97 (L) 10/12/2021 0117   CO2 30 10/12/2021 0117   GLUCOSE 257 (H) 10/12/2021 0117   BUN 23 (H) 10/12/2021 0117   CREATININE 1.09 10/12/2021 0117   CALCIUM 8.6 (L) 10/12/2021 0117   PROT 7.6 10/03/2021 1349   ALBUMIN 3.1 (L) 10/03/2021 1349   AST 14 (L) 10/03/2021 1349   ALT 11 10/03/2021 1349   ALKPHOS 108 10/03/2021 1349   BILITOT 1.1 10/03/2021 1349   GFRNONAA >60 10/12/2021 0117    GFR Estimated Creatinine Clearance: 85.9 mL/min (by C-G formula based on SCr of 1.09 mg/dL). No results for input(s): LIPASE, AMYLASE in the last 72 hours. No results for input(s): CKTOTAL, CKMB, CKMBINDEX, TROPONINI in the last 72 hours. Invalid input(s): POCBNP No results for input(s): DDIMER in the last 72 hours. No results for input(s): HGBA1C in the last 72 hours. No results for input(s): CHOL, HDL, LDLCALC, TRIG, CHOLHDL, LDLDIRECT in the last 72 hours. No results for input(s): TSH, T4TOTAL, T3FREE, THYROIDAB in the last 72  hours.  Invalid input(s): FREET3 No results for input(s): VITAMINB12, FOLATE, FERRITIN, TIBC, IRON, RETICCTPCT in the last 72 hours. Coags: No results for input(s): INR in the last 72 hours.  Invalid input(s): PT Microbiology: Recent Results (from the past 240 hour(s))  Resp Panel by RT-PCR (Flu A&B, Covid) Nasopharyngeal Swab     Status: None   Collection Time: 10/19/21  9:21 AM   Specimen: Nasopharyngeal Swab; Nasopharyngeal(NP) swabs in vial transport medium  Result Value Ref Range Status   SARS Coronavirus 2 by RT PCR NEGATIVE NEGATIVE Final    Comment: (NOTE) SARS-CoV-2 target nucleic acids are NOT DETECTED.  The SARS-CoV-2 RNA is generally detectable in upper respiratory specimens during the acute phase of infection. The lowest concentration of SARS-CoV-2 viral copies this assay can detect is 138 copies/mL. A negative result does not preclude SARS-Cov-2 infection and should not be used as the sole basis for treatment or other patient  management decisions. A negative result may occur with  improper specimen collection/handling, submission of specimen other than nasopharyngeal swab, presence of viral mutation(s) within the areas targeted by this assay, and inadequate number of viral copies(<138 copies/mL). A negative result must be combined with clinical observations, patient history, and epidemiological information. The expected result is Negative.  Fact Sheet for Patients:  EntrepreneurPulse.com.au  Fact Sheet for Healthcare Providers:  IncredibleEmployment.be  This test is no t yet approved or cleared by the [place] FDA and  has been authorized for detection and/or diagnosis of SARS-CoV-2 by FDA under an Emergency Use Authorization (EUA). This EUA will remain  in effect (meaning this test can be used) for the duration of the COVID-19 declaration under Section 564(b)(1) of the Act, 21 U.S.C.section 360bbb-3(b)(1), unless the  authorization is terminated  or revoked sooner.       Influenza A by PCR NEGATIVE NEGATIVE Final   Influenza B by PCR NEGATIVE NEGATIVE Final    Comment: (NOTE) The Xpert Xpress SARS-CoV-2/FLU/RSV plus assay is intended as an aid in the diagnosis of influenza from Nasopharyngeal swab specimens and should not be used as a sole basis for treatment. Nasal washings and aspirates are unacceptable for Xpert Xpress SARS-CoV-2/FLU/RSV testing.  Fact Sheet for Patients: EntrepreneurPulse.com.au  Fact Sheet for Healthcare Providers: IncredibleEmployment.be  This test is not yet approved or cleared by the [place] FDA and has been authorized for detection and/or diagnosis of SARS-CoV-2 by FDA under an Emergency Use Authorization (EUA). This EUA will remain in effect (meaning this test can be used) for the duration of the COVID-19 declaration under Section 564(b)(1) of the Act, 21 U.S.C. section 360bbb-3(b)(1), unless the authorization is terminated or revoked.  Performed at [facility], [place] [address], [place], [place] [address]     FURTHER DISCHARGE INSTRUCTIONS:  Get Medicines reviewed and adjusted: Please take all your medications with you for your next visit with your Primary MD  Laboratory/radiological data: Please request your Primary MD to go over all hospital tests and procedure/radiological results at the follow up, please ask your Primary MD to get all Hospital records sent to his/her office.  In some cases, they will be blood work, cultures and biopsy results pending at the time of your discharge. Please request that your primary care M.D. goes through all the records of your hospital data and follows up on these results.  Also Note the following: If you experience worsening of your admission symptoms, develop shortness of breath, life threatening emergency, suicidal or homicidal thoughts you must seek medical attention  immediately by calling 911 or calling your MD immediately  if symptoms less severe.  You must read complete instructions/literature along with all the possible adverse reactions/side effects for all the Medicines you take and that have been prescribed to you. Take any new Medicines after you have completely understood and accpet all the possible adverse reactions/side effects.   Do not drive when taking Pain medications or sleeping medications (Benzodaizepines)  Do not take more than prescribed Pain, Sleep and Anxiety Medications. It is not advisable to combine anxiety,sleep and pain medications without talking with your primary care practitioner  Special Instructions: If you have smoked or chewed Tobacco  in the last 2 yrs please stop smoking, stop any regular Alcohol  and or any Recreational drug use.  Wear Seat belts while driving.  Please note: You were cared for by a hospitalist during your hospital stay. Once you are discharged, your primary care  physician will handle any further medical issues. Please note that NO REFILLS for any discharge medications will be authorized once you are discharged, as it is imperative that you return to your primary care physician (or establish a relationship with a primary care physician if you do not have one) for your post hospital discharge needs so that they can reassess your need for medications and monitor your lab values.  Total Time spent coordinating discharge including counseling, education and face to face time equals 45 minutes.  SignedOren Binet 10/20/2021 2:37 PM

## 2021-10-19 NOTE — Progress Notes (Signed)
Attempted to call report to [facility]. Spoke with [place], receptionist. Contact information given to receptionist with instructions return call at earliest opportunity. Will attempt to contact again within 30 minutes.

## 2021-10-19 NOTE — TOC Progression Note (Signed)
Transition of Care [facility]) - Progression Note    Patient Details  Name: Brett White MRN: [id number] Date of Birth: 07/04/1961  Transition of Care [facility]) CM/SW Contact  Mearl Latin, LCSW Phone Number: 10/19/2021, 3:59 PM  Clinical Narrative:    CSW received call from Wilson at [facility] that he came to do paperwork with the patient but now are unable to accept patient after speaking with their corporate office. CSW discussed patient in QC meeting with leadership and unfortunately patient has no other bed offers and is functioning at minimum assist level. Patient reported agreement with discharging home tomorrow; [facility] obtaining home health.    Expected Discharge Plan: Home w Home Health Services Barriers to Discharge: Continued Medical Work up  Expected Discharge Plan and Services Expected Discharge Plan: Home w Home Health Services In-house Referral: Clinical Social Work Discharge Planning Services: CM Consult Post Acute Care Choice: Home Health Living arrangements for the past 2 months: Apartment Expected Discharge Date: 10/19/21                         HH Arranged: RN, PT, OT HH Agency: [facility] Date [facility] Agency Contacted: 10/19/21 Time HH Agency Contacted: 1550 Representative spoke with at [facility] Agency: Kandee Keen   Social Determinants of Health (SDOH) Interventions    Readmission Risk Interventions No flowsheet data found.

## 2021-10-20 ENCOUNTER — Other Ambulatory Visit (HOSPITAL_COMMUNITY): Payer: Self-pay

## 2021-10-20 LAB — GLUCOSE, CAPILLARY
Glucose-Capillary: 193 mg/dL — ABNORMAL HIGH (ref 70–99)
Glucose-Capillary: 215 mg/dL — ABNORMAL HIGH (ref 70–99)

## 2021-10-20 MED ORDER — SENNOSIDES-DOCUSATE SODIUM 8.6-50 MG PO TABS
2.0000 | ORAL_TABLET | Freq: Two times a day (BID) | ORAL | 0 refills | Status: DC
  Filled 2021-10-20: qty 30, 8d supply, fill #0

## 2021-10-20 MED ORDER — NOVOLOG MIX 70/30 FLEXPEN (70-30) 100 UNIT/ML ~~LOC~~ SUPN
PEN_INJECTOR | SUBCUTANEOUS | 3 refills | Status: DC
  Filled 2021-10-20: qty 21, 60d supply, fill #0

## 2021-10-20 MED ORDER — METFORMIN HCL 500 MG PO TABS
500.0000 mg | ORAL_TABLET | Freq: Every day | ORAL | 3 refills | Status: DC
  Filled 2021-10-20: qty 60, 60d supply, fill #0

## 2021-10-20 MED ORDER — ACETAMINOPHEN 500 MG PO TABS: 1000.0000 mg | ORAL_TABLET | Freq: Three times a day (TID) | ORAL | 0 refills | Status: DC | PRN

## 2021-10-20 MED ORDER — INSULIN PEN NEEDLE 32G X 4 MM MISC
0 refills | Status: DC
  Filled 2021-10-20: qty 100, 25d supply, fill #0

## 2021-10-20 MED ORDER — CEFADROXIL 500 MG PO CAPS: 500.0000 mg | ORAL_CAPSULE | Freq: Two times a day (BID) | ORAL | 0 refills | Status: AC

## 2021-10-20 MED ORDER — DULOXETINE HCL 30 MG PO CPEP
30.0000 mg | ORAL_CAPSULE | Freq: Every day | ORAL | 3 refills | Status: DC
  Filled 2021-10-20: qty 30, 30d supply, fill #0

## 2021-10-20 MED ORDER — METOPROLOL TARTRATE 25 MG PO TABS
12.5000 mg | ORAL_TABLET | Freq: Two times a day (BID) | ORAL | 3 refills | Status: DC
  Filled 2021-10-20: qty 60, 60d supply, fill #0

## 2021-10-20 MED ORDER — ASPIRIN 81 MG PO CHEW
81.0000 mg | CHEWABLE_TABLET | Freq: Every day | ORAL | 3 refills | Status: DC
  Filled 2021-10-20: qty 30, 30d supply, fill #0

## 2021-10-20 MED ORDER — POLYETHYLENE GLYCOL 3350 17 GM/SCOOP PO POWD
17.0000 g | Freq: Two times a day (BID) | ORAL | 0 refills | Status: DC
  Filled 2021-10-20: qty 238, 7d supply, fill #0

## 2021-10-20 MED ORDER — ATORVASTATIN CALCIUM 10 MG PO TABS
10.0000 mg | ORAL_TABLET | Freq: Every day | ORAL | 3 refills | Status: DC
  Filled 2021-10-20: qty 30, 30d supply, fill #0

## 2021-10-20 MED ORDER — GABAPENTIN 400 MG PO CAPS
400.0000 mg | ORAL_CAPSULE | Freq: Three times a day (TID) | ORAL | 3 refills | Status: DC
  Filled 2021-10-20: qty 90, 30d supply, fill #0

## 2021-10-20 MED ORDER — AMLODIPINE BESYLATE 5 MG PO TABS
5.0000 mg | ORAL_TABLET | Freq: Every day | ORAL | 3 refills | Status: DC
  Filled 2021-10-20: qty 30, 30d supply, fill #0

## 2021-10-20 MED ORDER — DOXYCYCLINE HYCLATE 100 MG PO TABS
100.0000 mg | ORAL_TABLET | Freq: Two times a day (BID) | ORAL | 0 refills | Status: AC
  Filled 2021-10-20: qty 6, 3d supply, fill #0

## 2021-10-20 MED ORDER — OXYCODONE HCL 10 MG PO TABS
10.0000 mg | ORAL_TABLET | Freq: Four times a day (QID) | ORAL | 0 refills | Status: DC | PRN
  Filled 2021-10-20: qty 30, 7d supply, fill #0

## 2021-10-20 MED ORDER — PANTOPRAZOLE SODIUM 40 MG PO TBEC
40.0000 mg | DELAYED_RELEASE_TABLET | Freq: Every day | ORAL | 3 refills | Status: DC
  Filled 2021-10-20: qty 30, 30d supply, fill #0

## 2021-10-20 NOTE — Progress Notes (Addendum)
PROGRESS NOTE    Brett White  [id number] DOB: [birth year] DOA: 10/03/2021 PCP: Arthur Holms, NP   Brief Narrative: 61 year old with history of poorly controlled DM-2, left BKA, HTN-who presented with left scapular abscess.    Subjective:  Apart from pain at the abscess site-no major issues.  Lying comfortably in bed.  Was evaluated by SNF yesterday-and declined-he now wants to go home with home health services.  Objective: Blood pressure 138/79, pulse 67, temperature 98.1 F (36.7 C), temperature source Oral, resp. rate 18, height 6\' 2"  (1.88 m), weight 95.3 kg, SpO2 94 %.   Gen Exam:Alert awake-not in any distress HEENT:atraumatic, normocephalic Chest: B/L clear to auscultation anteriorly CVS:S1S2 regular Abdomen:soft non tender, non distended Extremities:no edema s/p left BKA Neurology: Non focal Skin: no rash   Assessment & Plan: Sepsis due to left scapular abscess: Sepsis physiology has resolved.  S/p I&D by general surgery on 1/6-wound cultures positive for MRSA.  Recommendations from ID are to continue doxycycline/cefadroxil until 10/22/2021.  Per general surgery-okay to discharge-does not require any further debridements.  Continue twice daily wet-to-dry dressing changes.  AKI: Hemodynamically mediated-resolved.  Hyponatremia: Mild-doubt any clinical significance.  Watch periodically.  HTN: BP controlled-continue Norvasc  PAD: Stable-continue aspirin/statin  History of recent left BKA: Stump appears benign  DM-2 (A1c 12.2 on 1/4): CBGs well-controlled-continue insulin 70/30 16 units twice daily and SSI.  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  Constipation: Continue bowel regimen with Senokot and MiraLAX.    Depression: Continue with Cymbalta  Note-plans  were for discharge initially to SNF-however SNF declined-patient is okay/agreeable with going home with home health services.  He apparently lives with a "lady friend"-who can help him with wound care and ambulation.  BMI: Estimated body mass index is 26.96 kg/m as calculated from the following:   Height as of this encounter: 6\' 2"  (1.88 m).   Weight as of this encounter: 95.3 kg.   DVT prophylaxis: Lovenox Code Status: Full code Family Communication: None at bedside. Disposition Plan:  Status is: Inpatient  Patient is medically stable for discharge, awaiting SNF bed availability/insurance authorization  Consultants:  General surgery  Infectious disease  Procedures:  I&D 1-6  Data Reviewed: I have personally reviewed following labs and imaging studies  CBC: No results for input(s): WBC, NEUTROABS, HGB, HCT, MCV, PLT in the last 168 hours.  Basic Metabolic Panel: No results for input(s): NA, K, CL, CO2, GLUCOSE, BUN, CREATININE, CALCIUM, MG, PHOS in the last 168 hours.  GFR: Estimated Creatinine Clearance: 85.9 mL/min (by C-G formula based on SCr of 1.09 mg/dL). Liver Function Tests: No results for input(s): AST, ALT, ALKPHOS, BILITOT, PROT, ALBUMIN in the last 168 hours.  No results for input(s): LIPASE, AMYLASE in the last 168 hours. No results for input(s): AMMONIA in the last 168 hours. Coagulation Profile: No results for input(s): INR, PROTIME in the last 168 hours.  Cardiac Enzymes: No results for input(s): CKTOTAL, CKMB, CKMBINDEX, TROPONINI in the last 168 hours. BNP (last 3 results) No results for input(s): PROBNP in the last 8760 hours. HbA1C: No results for input(s): HGBA1C in the last 72 hours. CBG: Recent Labs  Lab 10/19/21 1245 10/19/21 1721 10/19/21 2048 10/20/21 0748 10/20/21 1146  GLUCAP 119* 195* 162* 193* 215*   Lipid Profile: No results for input(s):  CHOL, HDL, LDLCALC, TRIG, CHOLHDL, LDLDIRECT in the last 72 hours. Thyroid Function  Tests: No results for input(s): TSH, T4TOTAL, FREET4, T3FREE, THYROIDAB in the last 72 hours. Anemia Panel: No results for input(s): VITAMINB12, FOLATE, FERRITIN, TIBC, IRON, RETICCTPCT in the last 72 hours. Sepsis Labs: No results for input(s): PROCALCITON, LATICACIDVEN in the last 168 hours.   Recent Results (from the past 240 hour(s))  Resp Panel by RT-PCR (Flu A&B, Covid) Nasopharyngeal Swab     Status: None   Collection Time: 10/19/21  9:21 AM   Specimen: Nasopharyngeal Swab; Nasopharyngeal(NP) swabs in vial transport medium  Result Value Ref Range Status   SARS Coronavirus 2 by RT PCR NEGATIVE NEGATIVE Final    Comment: (NOTE) SARS-CoV-2 target nucleic acids are NOT DETECTED.  The SARS-CoV-2 RNA is generally detectable in upper respiratory specimens during the acute phase of infection. The lowest concentration of SARS-CoV-2 viral copies this assay can detect is 138 copies/mL. A negative result does not preclude SARS-Cov-2 infection and should not be used as the sole basis for treatment or other patient management decisions. A negative result may occur with  improper specimen collection/handling, submission of specimen other than nasopharyngeal swab, presence of viral mutation(s) within the areas targeted by this assay, and inadequate number of viral copies(<138 copies/mL). A negative result must be combined with clinical observations, patient history, and epidemiological information. The expected result is Negative.  Fact Sheet for Patients:  EntrepreneurPulse.com.au  Fact Sheet for Healthcare Providers:  IncredibleEmployment.be  This test is no t yet approved or cleared by the [place] FDA and  has been authorized for detection and/or diagnosis of SARS-CoV-2 by FDA under an Emergency Use Authorization (EUA). This EUA will remain  in effect (meaning this test can be used) for the duration of the COVID-19 declaration under Section  564(b)(1) of the Act, 21 U.S.C.section 360bbb-3(b)(1), unless the authorization is terminated  or revoked sooner.       Influenza A by PCR NEGATIVE NEGATIVE Final   Influenza B by PCR NEGATIVE NEGATIVE Final    Comment: (NOTE) The Xpert Xpress SARS-CoV-2/FLU/RSV plus assay is intended as an aid in the diagnosis of influenza from Nasopharyngeal swab specimens and should not be used as a sole basis for treatment. Nasal washings and aspirates are unacceptable for Xpert Xpress SARS-CoV-2/FLU/RSV testing.  Fact Sheet for Patients: EntrepreneurPulse.com.au  Fact Sheet for Healthcare Providers: IncredibleEmployment.be  This test is not yet approved or cleared by the [place] FDA and has been authorized for detection and/or diagnosis of SARS-CoV-2 by FDA under an Emergency Use Authorization (EUA). This EUA will remain in effect (meaning this test can be used) for the duration of the COVID-19 declaration under Section 564(b)(1) of the Act, 21 U.S.C. section 360bbb-3(b)(1), unless the authorization is terminated or revoked.  Performed at [facility], [place] [address] [place], [place] [address]           Radiology Studies: No results found.      Scheduled Meds:  acetaminophen  1,000 mg Oral Q8H   amLODipine  5 mg Oral Daily   aspirin  81 mg Oral Daily   atorvastatin  10 mg Oral Daily   cefadroxil  500 mg Oral BID   doxycycline  100 mg Oral Q12H   DULoxetine  30 mg Oral Daily   enoxaparin (LOVENOX) injection  40 mg Subcutaneous Q24H   gabapentin  400 mg Oral TID   insulin aspart  0-20 Units Subcutaneous TID WC  insulin aspart  0-5 Units Subcutaneous QHS   insulin aspart protamine- aspart  16 Units Subcutaneous BID WC   metoprolol tartrate  12.5 mg Oral BID   pantoprazole  40 mg Oral Daily   pneumococcal 23 valent vaccine  0.5 mL Intramuscular Tomorrow-1000   polyethylene glycol  17 g Oral BID   senna-docusate  2 tablet  Oral BID   sodium chloride flush  3 mL Intravenous Q12H   Continuous Infusions:     LOS: 17 days       Oren Binet, MD [facility]   If 7PM-7AM, please contact night-coverage [email or web address]  10/20/2021, 2:37 PM  Patient ID: Brett White, male   DOB: [birth year], 61 y.o.   MRN: [id number] Patient ID: Brett White, male   DOB: 08/25/1961, 61 y.o.   MRN: [id number]  patient ID: Brett White, male   DOB: [birth year], 61 y.o.   MRN: [id number] Patient ID: Brett White, male   DOB: [birth year], 61 y.o.   MRN: [id number] Patient ID: Brett White, male   DOB: 02-Feb-1961, 61 y.o.   MRN: [id number] Patient ID: Brett White, male   DOB: 1961/05/27, 61 y.o.   MRN: [id number] Patient ID: Brett White, male   DOB: [birth year], 61 y.o.   MRN: [id number]

## 2021-10-20 NOTE — Progress Notes (Signed)
PT Cancellation Note  Patient Details Name: Angelo Prindle MRN: [id number] DOB: [birth year]   Cancelled Treatment:    Reason Eval/Treat Not Completed: Other (comment).  Declines PT as he is leaving.     Ivar Drape 10/20/2021, 4:00 PM  Samul Dada, PT PhD Acute Rehab Dept. Number: [facility] [id number] and [facility] [phone]

## 2021-10-20 NOTE — TOC Transition Note (Signed)
Transition of Care [facility]) - CM/SW Discharge Note   Patient Details  Name: Brett White MRN: [id number] Date of Birth: March 11, 1961  [facility]) CM/SW Contact:  Lockie Pares, RN Phone Number: 10/20/2021, 12:53 PM   Clinical Narrative:   .  Patient will be discharged today [facility] home health provider. Has all DME.    Final next level of care: Home w Home Health Services Barriers to Discharge: No Barriers Identified   Patient Goals and CMS Choice Patient states their goals for this hospitalization and ongoing recovery are:: return home CMS Medicare.gov Compare Post Acute Care list provided to:: Patient Choice offered to / list presented to : Patient  Discharge Placement   Existing PASRR number confirmed : 10/19/21          Patient [facility] bed at: [facility] Patient to be transferred to facility by: [facility] Name of family member notified: Pt declined Patient and family notified of of transfer: 10/19/21  Discharge Plan and Services In-house Referral: Clinical Social Work Discharge Planning Services: CM Consult Post Acute Care Choice: Home Health                    HH Arranged: RN, PT, OT [facility] Agency: [facility] Date [facility] Agency Contacted: 10/19/21 Time HH Agency Contacted: 1550 Representative spoke with at [facility] Agency: Kandee Keen  Social Determinants of Health (SDOH) Interventions     Readmission Risk Interventions No flowsheet data found.

## 2021-10-20 NOTE — Care Management (Signed)
Making transportation arrangements for patient

## 2021-10-20 NOTE — Discharge Planning (Signed)
RNCM consulted regarding transportation needs for pt.  RNCM provided [employer] as pt has no transportation from hospital.  Pt has [facility] Therapist, nutritional and Release of Liability Form on file, therefore agreeing to written terms.

## 2021-10-23 ENCOUNTER — Inpatient Hospital Stay: Payer: Medicare HMO | Admitting: Internal Medicine

## 2021-10-25 ENCOUNTER — Encounter (HOSPITAL_COMMUNITY): Payer: Self-pay

## 2021-10-25 ENCOUNTER — Other Ambulatory Visit: Payer: Self-pay

## 2021-10-25 ENCOUNTER — Emergency Department (HOSPITAL_COMMUNITY)
Admission: EM | Admit: 2021-10-25 | Discharge: 2021-10-26 | Disposition: A | Discharge status: Home / Self Care | Payer: Medicare HMO | Attending: Emergency Medicine | Admitting: Emergency Medicine

## 2021-10-25 DIAGNOSIS — Z79899 Other long term (current) drug therapy: Secondary | ICD-10-CM | POA: Insufficient documentation

## 2021-10-25 DIAGNOSIS — Z7982 Long term (current) use of aspirin: Secondary | ICD-10-CM | POA: Diagnosis not present

## 2021-10-25 DIAGNOSIS — Z794 Long term (current) use of insulin: Secondary | ICD-10-CM | POA: Diagnosis not present

## 2021-10-25 DIAGNOSIS — E875 Hyperkalemia: Secondary | ICD-10-CM | POA: Insufficient documentation

## 2021-10-25 DIAGNOSIS — E119 Type 2 diabetes mellitus without complications: Secondary | ICD-10-CM | POA: Insufficient documentation

## 2021-10-25 DIAGNOSIS — R112 Nausea with vomiting, unspecified: Secondary | ICD-10-CM

## 2021-10-25 DIAGNOSIS — I10 Essential (primary) hypertension: Secondary | ICD-10-CM | POA: Diagnosis not present

## 2021-10-25 DIAGNOSIS — E871 Hypo-osmolality and hyponatremia: Secondary | ICD-10-CM | POA: Diagnosis not present

## 2021-10-25 DIAGNOSIS — Z7984 Long term (current) use of oral hypoglycemic drugs: Secondary | ICD-10-CM | POA: Insufficient documentation

## 2021-10-25 DIAGNOSIS — R509 Fever, unspecified: Secondary | ICD-10-CM | POA: Insufficient documentation

## 2021-10-25 DIAGNOSIS — L02412 Cutaneous abscess of left axilla: Secondary | ICD-10-CM | POA: Diagnosis not present

## 2021-10-25 DIAGNOSIS — M549 Dorsalgia, unspecified: Secondary | ICD-10-CM

## 2021-10-25 DIAGNOSIS — M546 Pain in thoracic spine: Secondary | ICD-10-CM | POA: Insufficient documentation

## 2021-10-25 LAB — CBC WITH DIFFERENTIAL/PLATELET
Abs Immature Granulocytes: 0.01 10*3/uL (ref 0.00–0.07)
Basophils Absolute: 0 10*3/uL (ref 0.0–0.1)
Basophils Relative: 1 %
Eosinophils Absolute: 0.1 10*3/uL (ref 0.0–0.5)
Eosinophils Relative: 1 %
HCT: 43.5 % (ref 39.0–52.0)
Hemoglobin: 13.9 g/dL (ref 13.0–17.0)
Immature Granulocytes: 0 %
Lymphocytes Relative: 22 %
Lymphs Abs: 1.4 10*3/uL (ref 0.7–4.0)
MCH: 26.9 pg (ref 26.0–34.0)
MCHC: 32 g/dL (ref 30.0–36.0)
MCV: 84.1 fL (ref 80.0–100.0)
Monocytes Absolute: 0.4 10*3/uL (ref 0.1–1.0)
Monocytes Relative: 6 %
Neutro Abs: 4.5 10*3/uL (ref 1.7–7.7)
Neutrophils Relative %: 70 %
Platelets: 343 10*3/uL (ref 150–400)
RBC: 5.17 MIL/uL (ref 4.22–5.81)
RDW: 13.2 % (ref 11.5–15.5)
WBC: 6.4 10*3/uL (ref 4.0–10.5)
nRBC: 0 % (ref 0.0–0.2)

## 2021-10-25 LAB — BASIC METABOLIC PANEL
Anion gap: 10 (ref 5–15)
BUN: 16 mg/dL (ref 6–20)
CO2: 25 mmol/L (ref 22–32)
Calcium: 9.4 mg/dL (ref 8.9–10.3)
Chloride: 99 mmol/L (ref 98–111)
Creatinine, Ser: 1 mg/dL (ref 0.61–1.24)
GFR, Estimated: >60 mL/min (ref >60)
Glucose, Bld: 263 mg/dL — ABNORMAL HIGH (ref 70–99)
Potassium: 5.4 mmol/L — ABNORMAL HIGH (ref 3.5–5.1)
Sodium: 134 mmol/L — ABNORMAL LOW (ref 135–145)

## 2021-10-25 MED ORDER — HYDROCODONE-ACETAMINOPHEN 5-325 MG PO TABS
1.0000 | ORAL_TABLET | Freq: Once | ORAL | Status: AC
  Administered 2021-10-25: 17:00:00 1 via ORAL
  Filled 2021-10-25: qty 1

## 2021-10-25 NOTE — ED Triage Notes (Signed)
Pt arrived via [facility] from home for c/o 10/10 sharp pain in wound on back. Pt states the oxycodone is not working. Pt c/o N/V since yesterday. Pt is a little hypertensive in triage. Pt is A&Ox4. Pt's wound on left upper back is 3cmx2.4cm, 1.4cm deep. There is an area of green eschar in the middle w/pink bedding surrounding the area. The skin is erythematous surrounding the wound and is hot to touch.

## 2021-10-25 NOTE — ED Provider Triage Note (Signed)
°  Emergency Medicine Provider Triage Evaluation Note  Brett White , a 61 y.o. male  was evaluated in triage.  Pt complains of vomiting.  Patient states that he was discharged from the hospital on 10/19/2021 due to an abscess on his back.  Abscess is noted to left side of his upper back.  Patient states he is had 3 episodes of nausea and vomiting since last night.  Review of Systems  Positive: Body aches, fever, chills, nausea, vomiting, left shoulder pain Negative: Chest pain, shortness of breath, abdominal pain, diarrhea  Physical Exam  BP (!) 158/104    Pulse 95    Temp 98.9 F (37.2 C) (Oral)    Resp (!) 22    Ht 6\' 2"  (1.88 m)    Wt 95.3 kg    SpO2 100%    BMI 26.98 kg/m  Gen:   Awake, no distress   Resp:  Normal effort  MSK:   Moves extremities without difficulty  Other:  Large abscess noted to left posterior shoulder.  Please see picture that have attached to this chart.  Medical Decision Making  Medically screening exam initiated at 4:06 PM.  Appropriate orders placed.  Brett White was informed that the remainder of the evaluation will be completed by another provider, this initial triage assessment does not replace that evaluation, and the importance of remaining in the ED until their evaluation is complete.     Jake Church, PA-C 10/25/21 1610

## 2021-10-26 MED ORDER — NAPROXEN 500 MG PO TABS: 500.0000 mg | ORAL_TABLET | Freq: Two times a day (BID) | ORAL | 0 refills | Status: DC

## 2021-10-26 MED ORDER — HYDROMORPHONE HCL 2 MG PO TABS: 2.0000 mg | ORAL_TABLET | ORAL | 0 refills | Status: DC | PRN

## 2021-10-26 MED ORDER — ONDANSETRON HCL 4 MG PO TABS: 4.0000 mg | ORAL_TABLET | Freq: Four times a day (QID) | ORAL | 0 refills | Status: DC | PRN

## 2021-10-26 MED ORDER — LACTATED RINGERS IV BOLUS
1000.0000 mL | Freq: Once | INTRAVENOUS | Status: AC
Start: 2021-10-26 — End: 2021-10-26
  Administered 2021-10-26: 1000 mL via INTRAVENOUS

## 2021-10-26 MED ORDER — MORPHINE SULFATE (PF) 4 MG/ML IV SOLN
4.0000 mg | Freq: Once | INTRAVENOUS | Status: AC
  Administered 2021-10-26: 4 mg via INTRAVENOUS
  Filled 2021-10-26: qty 1

## 2021-10-26 MED ORDER — KETOROLAC TROMETHAMINE 30 MG/ML IJ SOLN
30.0000 mg | Freq: Once | INTRAMUSCULAR | Status: AC
  Administered 2021-10-26: 30 mg via INTRAVENOUS
  Filled 2021-10-26: qty 1

## 2021-10-26 MED ORDER — HYDROMORPHONE HCL 1 MG/ML IJ SOLN
1.0000 mg | Freq: Once | INTRAMUSCULAR | Status: AC
  Administered 2021-10-26: 1 mg via INTRAVENOUS
  Filled 2021-10-26: qty 1

## 2021-10-26 MED ORDER — ONDANSETRON HCL 4 MG/2ML IJ SOLN
4.0000 mg | Freq: Once | INTRAMUSCULAR | Status: AC
  Administered 2021-10-26: 4 mg via INTRAVENOUS
  Filled 2021-10-26: qty 2

## 2021-10-26 NOTE — Discharge Instructions (Signed)
Continue routine wound care.  You may add acetaminophen (Tylenol) to your medication regimen to get additional pain relief.  Return if pain is not being adequately controlled, or if you start running a fever.

## 2021-10-26 NOTE — ED Notes (Signed)
Pt verbalizes understanding of discharge instructions. Opportunity for questions and answers were provided. Pt discharged from the ED home via [facility]. 

## 2021-10-26 NOTE — ED Provider Notes (Signed)
[facility] EMERGENCY DEPARTMENT Provider Note   CSN: [id number] Arrival date & time: 10/25/21  1547     History  Chief Complaint  Patient presents with   Emesis    Brett White is a 61 y.o. male.  The history is provided by the patient.  Emesis He has history of diabetes, hypertension and comes in because of pain at site of abscess incision and drainage, nausea and vomiting.  He had been admitted to the hospital with an abscess in the left scapular area which was treated with incision and drainage and he was discharged on antibiotics.  2 days ago, he started having nausea and vomiting.  This was associated with fevers as well as chills.  He states that he is having ongoing pain in the site of incision and drainage and that oxycodone which she was prescribed has not been giving him pain relief.   Home Medications Prior to Admission medications   Medication Sig Start Date End Date Taking? Authorizing Provider  acetaminophen (TYLENOL) 500 MG tablet Take 2 tablets (1,000 mg total) by mouth every 8 (eight) hours as needed. 10/20/21   Ghimire, Henreitta Leber, MD  amLODipine (NORVASC) 5 MG tablet Take 1 tablet (5 mg total) by mouth daily. 10/20/21   Ghimire, Henreitta Leber, MD  aspirin 81 MG chewable tablet Chew 1 tablet (81 mg total) by mouth daily. 10/20/21   Ghimire, Henreitta Leber, MD  atorvastatin (LIPITOR) 10 MG tablet Take 1 tablet (10 mg total) by mouth daily. 10/20/21   Ghimire, Henreitta Leber, MD  DULoxetine (CYMBALTA) 30 MG capsule Take 1 capsule (30 mg total) by mouth daily. 10/20/21   Ghimire, Henreitta Leber, MD  gabapentin (NEURONTIN) 400 MG capsule Take 1 capsule (400 mg total) by mouth 3 (three) times daily. 10/20/21   Ghimire, Henreitta Leber, MD  insulin aspart protamine - aspart (NOVOLOG MIX 70/30 FLEXPEN) (70-30) 100 UNIT/ML FlexPen Inject 16 units twice daily as directed. 10/20/21   Ghimire, Henreitta Leber, MD  Insulin Pen Needle 32G X 4 MM MISC Use to inject insulin up to 4 times daily as  needed. 10/20/21   Ghimire, Henreitta Leber, MD  metFORMIN (GLUCOPHAGE) 500 MG tablet Take 1 tablet (500 mg total) by mouth daily with breakfast. 10/20/21   Ghimire, Henreitta Leber, MD  metoprolol tartrate (LOPRESSOR) 25 MG tablet Take 1/2 tablet (12.5 mg total) by mouth 2 (two) times daily. 10/20/21   Ghimire, Henreitta Leber, MD  Oxycodone HCl 10 MG TABS Take 1 tablet (10 mg total) by mouth every 6 (six) hours as needed. 10/20/21   Ghimire, Henreitta Leber, MD  pantoprazole (PROTONIX) 40 MG tablet Take 1 tablet (40 mg total) by mouth daily. 10/20/21   Ghimire, Henreitta Leber, MD  polyethylene glycol powder (GLYCOLAX/MIRALAX) 17 GM/SCOOP powder Take 1 capful (17 g) by mouth 2 (two) times daily. 10/20/21   Ghimire, Henreitta Leber, MD  senna-docusate (SENOKOT-S) 8.6-50 MG tablet Take 2 tablets by mouth 2 (two) times daily. 10/20/21   Ghimire, Henreitta Leber, MD      Allergies    Patient has no known allergies.    Review of Systems   Review of Systems  Gastrointestinal:  Positive for vomiting.  All other systems reviewed and are negative.  Physical Exam Updated Vital Signs BP (!) 168/105 (BP Location: Right Arm)    Pulse 86    Temp 98.9 F (37.2 C) (Oral)    Resp 17    Ht 6\' 2"  (1.88 m)    Wt  95.3 kg    SpO2 98%    BMI 26.98 kg/m  Physical Exam Vitals and nursing note reviewed.  61 year old male, resting comfortably and in no acute distress. Vital signs are significant for elevated blood pressure. Oxygen saturation is 98%, which is normal. Head is normocephalic and atraumatic. PERRLA, EOMI. Oropharynx is clear. Neck is nontender and supple without adenopathy or JVD. Back: Site of incision and drainage of abscess noted in the left suprascapular area.  There is faint erythema of the skin and minimal warmth but it is very tender.  There is no drainage. Lungs are clear without rales, wheezes, or rhonchi. Chest is nontender. Heart has regular rate and rhythm without murmur. Abdomen is soft, flat, nontender. Extremities: Left below the  knee amputation. Skin is warm and dry without rash. Neurologic: Mental status is normal, cranial nerves are intact, t moves all extremities equally.  ED Results / Procedures / Treatments   Labs (all labs ordered are listed, but only abnormal results are displayed) Labs Reviewed  BASIC METABOLIC PANEL - Abnormal; Notable for the following components:      Result Value   Sodium 134 (*)    Potassium 5.4 (*)    Glucose, Bld 263 (*)    All other components within normal limits  CBC WITH DIFFERENTIAL/PLATELET   Procedures Procedures    Medications Ordered in ED Medications  HYDROcodone-acetaminophen (NORCO/VICODIN) 5-325 MG per tablet 1 tablet (1 tablet Oral Given 10/25/21 1654)  lactated ringers bolus 1,000 mL (1,000 mLs Intravenous New Bag/Given 10/26/21 0456)  morphine 4 MG/ML injection 4 mg (4 mg Intravenous Given 10/26/21 0457)  ondansetron (ZOFRAN) injection 4 mg (4 mg Intravenous Given 10/26/21 0456)  ketorolac (TORADOL) 30 MG/ML injection 30 mg (30 mg Intravenous Given 10/26/21 0457)  HYDROmorphone (DILAUDID) injection 1 mg (1 mg Intravenous Given 10/26/21 0620)    ED Course/ Medical Decision Making/ A&P                           Medical Decision Making Risk Prescription drug management.   Nausea and vomiting, cause uncertain.  Healing abscess on the left suprascapular area with ongoing pain.  Old records reviewed confirming recent hospitalization for left suprascapular abscess treated with incision and drainage.  He will be given IV fluids, morphine, ondansetron, ketorolac.  Labs obtained in the ED are reassuring.  Mild hyperkalemia is present felt to be related to hemolysis, mild hyponatremia present which is not clinically significant.  WBC is normal with normal differential.  Following above-noted treatment, he states that pain was not improved but nausea was.  He is given a dose of hydromorphone with moderate relief of pain.  He is discharged with prescriptions for naproxen,  hydromorphone, ondansetron and told to use over-the-counter acetaminophen as needed for additional pain relief.  He is given an ambulatory referral to the wound care clinic.  Return precautions discussed.        Final Clinical Impression(s) / ED Diagnoses Final diagnoses:  Nausea and vomiting, unspecified vomiting type  Upper back pain on left side  Elevated blood pressure reading with diagnosis of hypertension    Rx / DC Orders ED Discharge Orders          Ordered    Ambulatory referral to [facility]        10/26/21 0646    ondansetron (ZOFRAN) 4 MG tablet  Every 6 hours PRN        10/26/21 0646  naproxen (NAPROSYN) 500 MG tablet  2 times daily        10/26/21 0646    HYDROmorphone (DILAUDID) 2 MG tablet  Every 4 hours PRN        10/26/21 [id number]              Delora Fuel, MD [id number] [phone]

## 2021-10-26 NOTE — ED Notes (Signed)
[facility] called to transport patient  

## 2021-11-01 ENCOUNTER — Encounter (HOSPITAL_BASED_OUTPATIENT_CLINIC_OR_DEPARTMENT_OTHER): Payer: Medicare HMO | Attending: Internal Medicine | Admitting: Physician Assistant

## 2021-12-09 ENCOUNTER — Emergency Department (HOSPITAL_COMMUNITY): Payer: Medicare HMO

## 2021-12-09 ENCOUNTER — Encounter (HOSPITAL_COMMUNITY): Payer: Self-pay | Admitting: Emergency Medicine

## 2021-12-09 ENCOUNTER — Inpatient Hospital Stay (HOSPITAL_COMMUNITY)
Admission: EM | Admit: 2021-12-09 | Discharge: 2021-12-15 | DRG: 194 | Disposition: A | Discharge status: Home / Self Care | Payer: Medicare HMO | Attending: Internal Medicine | Admitting: Internal Medicine

## 2021-12-09 ENCOUNTER — Other Ambulatory Visit: Payer: Self-pay

## 2021-12-09 DIAGNOSIS — Z794 Long term (current) use of insulin: Secondary | ICD-10-CM

## 2021-12-09 DIAGNOSIS — I152 Hypertension secondary to endocrine disorders: Secondary | ICD-10-CM | POA: Diagnosis present

## 2021-12-09 DIAGNOSIS — Z89512 Acquired absence of left leg below knee: Secondary | ICD-10-CM

## 2021-12-09 DIAGNOSIS — R000 Tachycardia, unspecified: Secondary | ICD-10-CM

## 2021-12-09 DIAGNOSIS — K219 Gastro-esophageal reflux disease without esophagitis: Secondary | ICD-10-CM | POA: Diagnosis present

## 2021-12-09 DIAGNOSIS — I10 Essential (primary) hypertension: Secondary | ICD-10-CM | POA: Diagnosis present

## 2021-12-09 DIAGNOSIS — Z8673 Personal history of transient ischemic attack (TIA), and cerebral infarction without residual deficits: Secondary | ICD-10-CM

## 2021-12-09 DIAGNOSIS — E1165 Type 2 diabetes mellitus with hyperglycemia: Secondary | ICD-10-CM

## 2021-12-09 DIAGNOSIS — E871 Hypo-osmolality and hyponatremia: Secondary | ICD-10-CM

## 2021-12-09 DIAGNOSIS — J189 Pneumonia, unspecified organism: Principal | ICD-10-CM | POA: Diagnosis present

## 2021-12-09 DIAGNOSIS — Z7984 Long term (current) use of oral hypoglycemic drugs: Secondary | ICD-10-CM

## 2021-12-09 DIAGNOSIS — Z20822 Contact with and (suspected) exposure to covid-19: Secondary | ICD-10-CM | POA: Diagnosis present

## 2021-12-09 DIAGNOSIS — J45909 Unspecified asthma, uncomplicated: Secondary | ICD-10-CM | POA: Diagnosis present

## 2021-12-09 DIAGNOSIS — Z89412 Acquired absence of left great toe: Secondary | ICD-10-CM

## 2021-12-09 DIAGNOSIS — W19XXXA Unspecified fall, initial encounter: Secondary | ICD-10-CM

## 2021-12-09 DIAGNOSIS — R052 Subacute cough: Secondary | ICD-10-CM

## 2021-12-09 DIAGNOSIS — Z79899 Other long term (current) drug therapy: Secondary | ICD-10-CM

## 2021-12-09 DIAGNOSIS — Z7982 Long term (current) use of aspirin: Secondary | ICD-10-CM

## 2021-12-09 DIAGNOSIS — E1140 Type 2 diabetes mellitus with diabetic neuropathy, unspecified: Secondary | ICD-10-CM | POA: Diagnosis present

## 2021-12-09 HISTORY — DX: Gastro-esophageal reflux disease without esophagitis: K21.9

## 2021-12-09 HISTORY — DX: Pneumonia, unspecified organism: J18.9

## 2021-12-09 HISTORY — DX: Polyneuropathy, unspecified: G62.9

## 2021-12-09 HISTORY — DX: Type 2 diabetes mellitus without complications: E11.9

## 2021-12-09 HISTORY — DX: Type 2 diabetes mellitus without complications: Z79.4

## 2021-12-09 HISTORY — DX: Essential (primary) hypertension: I10

## 2021-12-09 LAB — COMPREHENSIVE METABOLIC PANEL
ALT: 15 U/L (ref 0–44)
AST: 15 U/L (ref 15–41)
Albumin: 3.7 g/dL (ref 3.5–5.0)
Alkaline Phosphatase: 107 U/L (ref 38–126)
Anion gap: 8 (ref 5–15)
BUN: 22 mg/dL — ABNORMAL HIGH (ref 6–20)
CO2: 26 mmol/L (ref 22–32)
Calcium: 9.1 mg/dL (ref 8.9–10.3)
Chloride: 98 mmol/L (ref 98–111)
Creatinine, Ser: 1.12 mg/dL (ref 0.61–1.24)
GFR, Estimated: >60 mL/min (ref >60)
Glucose, Bld: 267 mg/dL — ABNORMAL HIGH (ref 70–99)
Potassium: 4.1 mmol/L (ref 3.5–5.1)
Sodium: 132 mmol/L — ABNORMAL LOW (ref 135–145)
Total Bilirubin: 0.7 mg/dL (ref 0.3–1.2)
Total Protein: 7.9 g/dL (ref 6.5–8.1)

## 2021-12-09 LAB — URINALYSIS, ROUTINE W REFLEX MICROSCOPIC
Bacteria, UA: NONE SEEN
Bilirubin Urine: NEGATIVE
Glucose, UA: >=500 mg/dL — AB
Hgb urine dipstick: NEGATIVE
Ketones, ur: NEGATIVE mg/dL
Leukocytes,Ua: NEGATIVE
Nitrite: NEGATIVE
Protein, ur: 100 mg/dL — AB
Specific Gravity, Urine: 1.021 (ref 1.005–1.030)
pH: 7 (ref 5.0–8.0)

## 2021-12-09 LAB — CBC WITH DIFFERENTIAL/PLATELET
Abs Immature Granulocytes: 0.05 10*3/uL (ref 0.00–0.07)
Basophils Absolute: 0 10*3/uL (ref 0.0–0.1)
Basophils Relative: 0 %
Eosinophils Absolute: 0 10*3/uL (ref 0.0–0.5)
Eosinophils Relative: 0 %
HCT: 43.2 % (ref 39.0–52.0)
Hemoglobin: 14.3 g/dL (ref 13.0–17.0)
Immature Granulocytes: 0 %
Lymphocytes Relative: 9 %
Lymphs Abs: 1.1 10*3/uL (ref 0.7–4.0)
MCH: 27.5 pg (ref 26.0–34.0)
MCHC: 33.1 g/dL (ref 30.0–36.0)
MCV: 83.1 fL (ref 80.0–100.0)
Monocytes Absolute: 0.8 10*3/uL (ref 0.1–1.0)
Monocytes Relative: 6 %
Neutro Abs: 11.2 10*3/uL — ABNORMAL HIGH (ref 1.7–7.7)
Neutrophils Relative %: 85 %
Platelets: 322 10*3/uL (ref 150–400)
RBC: 5.2 MIL/uL (ref 4.22–5.81)
RDW: 13.1 % (ref 11.5–15.5)
WBC: 13.3 10*3/uL — ABNORMAL HIGH (ref 4.0–10.5)
nRBC: 0 % (ref 0.0–0.2)

## 2021-12-09 LAB — LACTIC ACID, PLASMA
Lactic Acid, Venous: 1.6 mmol/L (ref 0.5–1.9)
Lactic Acid, Venous: 1.2 mmol/L (ref 0.5–1.9)

## 2021-12-09 LAB — RESP PANEL BY RT-PCR (FLU A&B, COVID) ARPGX2
Influenza A by PCR: NEGATIVE
Influenza B by PCR: NEGATIVE
SARS Coronavirus 2 by RT PCR: NEGATIVE

## 2021-12-09 LAB — APTT: aPTT: 27 seconds (ref 24–36)

## 2021-12-09 LAB — PROTIME-INR
INR: 1 (ref 0.8–1.2)
Prothrombin Time: 12.9 seconds (ref 11.4–15.2)

## 2021-12-09 IMAGING — DX DG CHEST 1V PORT
1 series · 1 of 1 positions shown · non-contrast
Comparison: [DATE]

CLINICAL DATA: Questionable sepsis - evaluate for abnormality

EXAM:
PORTABLE CHEST 1 VIEW

[chest ap]
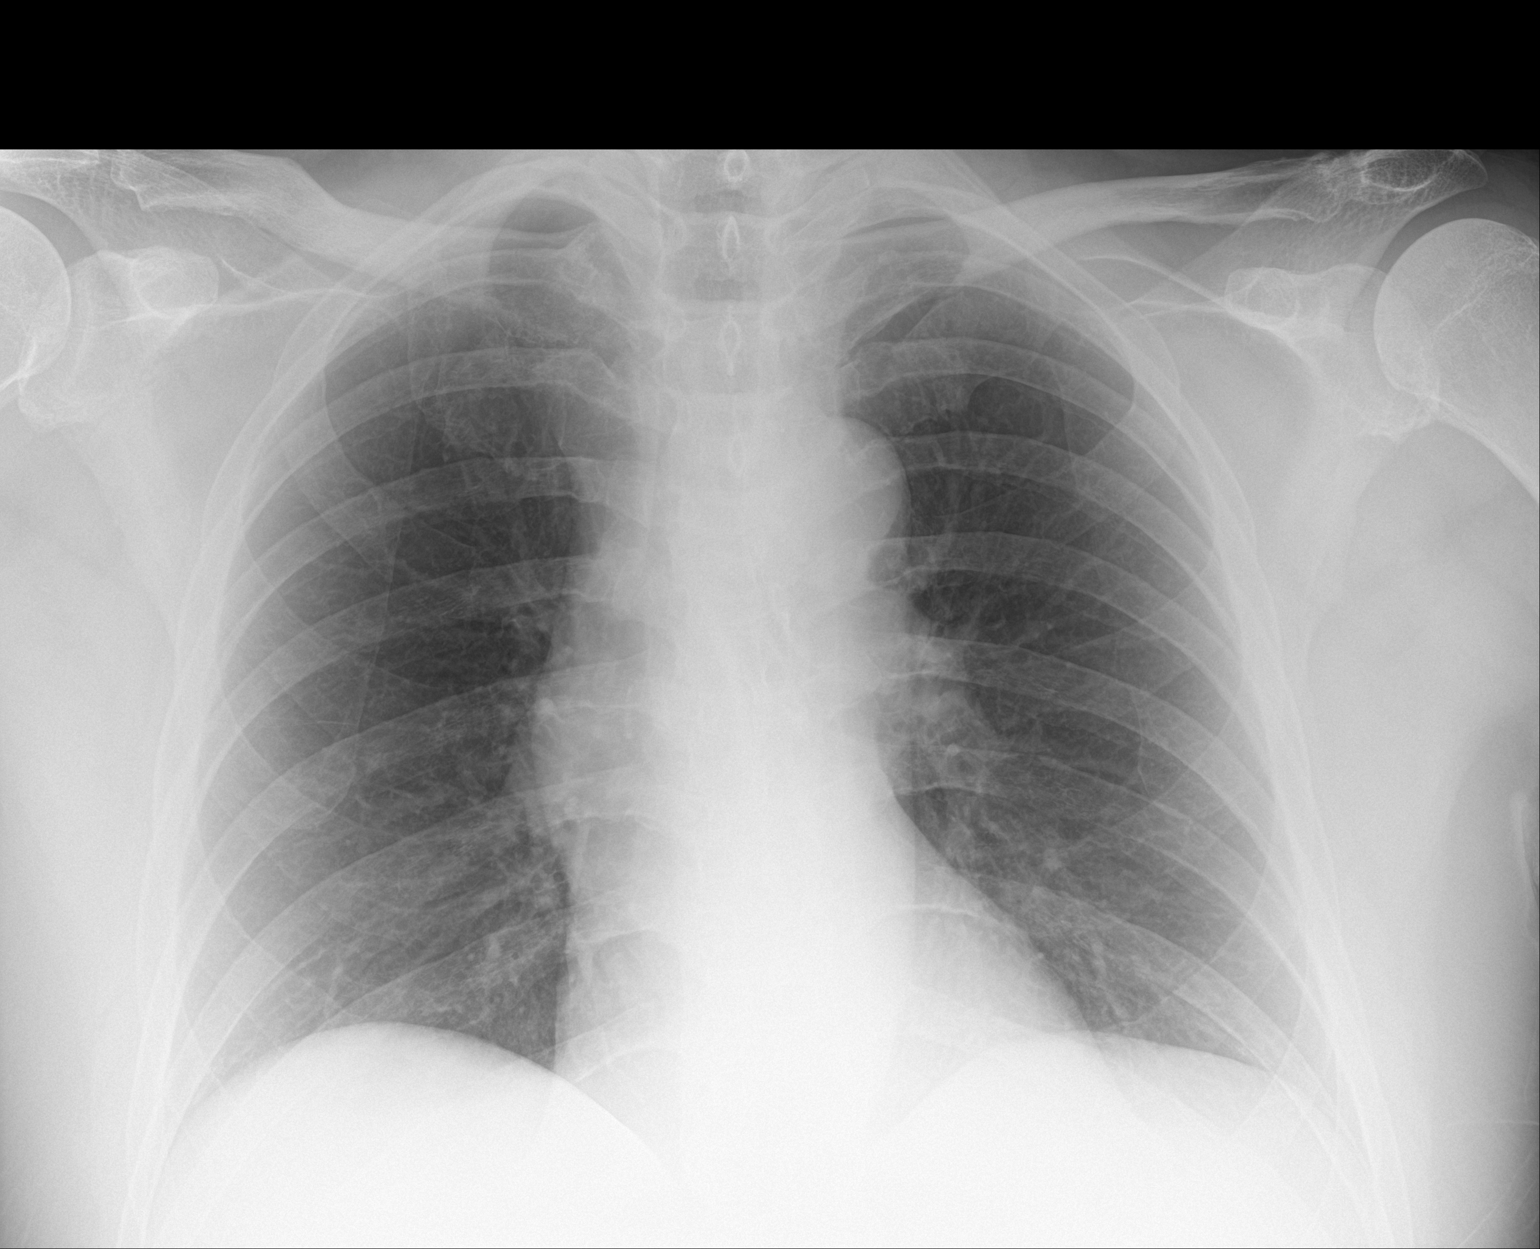

[1 of 1 positions shown; findings below may reference images not displayed]

FINDINGS: The heart size and mediastinal contours are within normal limits and
stable. Both lungs are clear. The visualized skeletal structures are
unremarkable.
IMPRESSION: No active disease.

## 2021-12-09 IMAGING — CT CT ANGIO CHEST
3 of 7 series · 17 of 36 positions shown · IV contrast (OMNIPAQUE 350)
Comparison: None

CLINICAL DATA: Fever, body aches, wheezing, hyperglycemia, question
pulmonary embolism

EXAM:
CT ANGIOGRAPHY CHEST WITH CONTRAST
TECHNIQUE: Multidetector CT imaging of the chest was performed using the
standard protocol during bolus administration of intravenous
contrast. Multiplanar CT image reconstructions and MIPs were
obtained to evaluate the vascular anatomy.

[Series 5: thins · axial · 0.77mm/px · z∈[-255,-12]mm · 12 of 289 slices shown]
[im 23/289  lung]
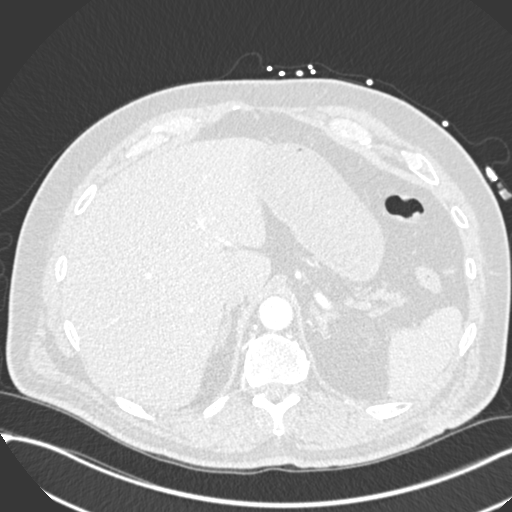
[im 45/289  mediastinal]
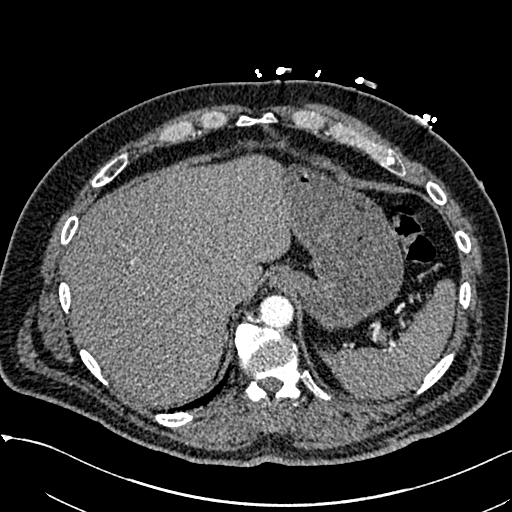
[im 67/289  lung]
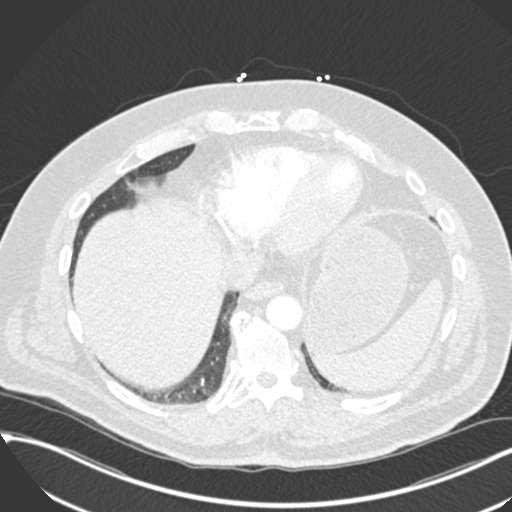
[im 89/289  mediastinal]
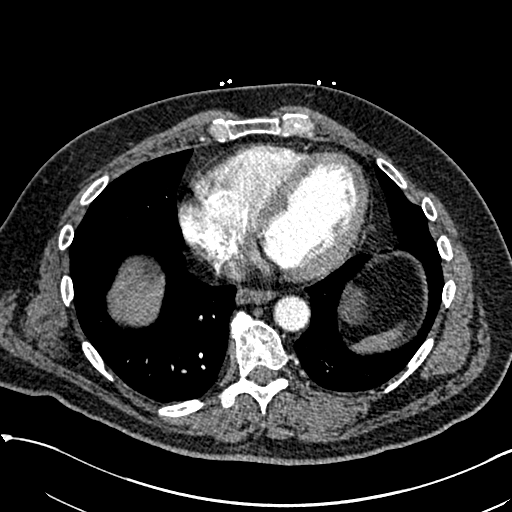
[im 111/289  lung]
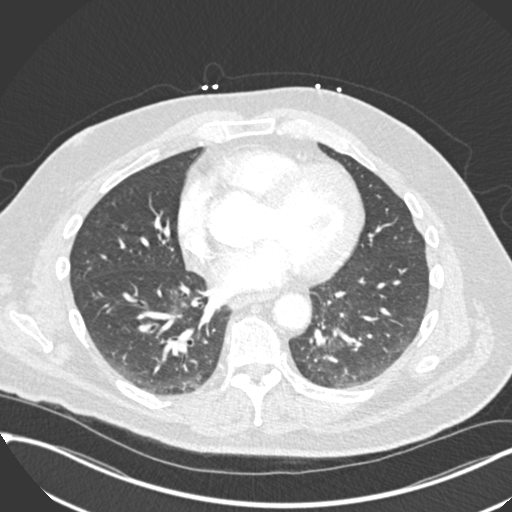
[im 133/289  mediastinal]
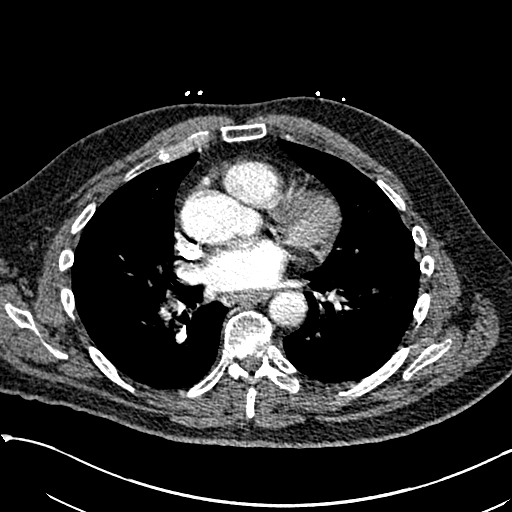
[im 156/289  lung]
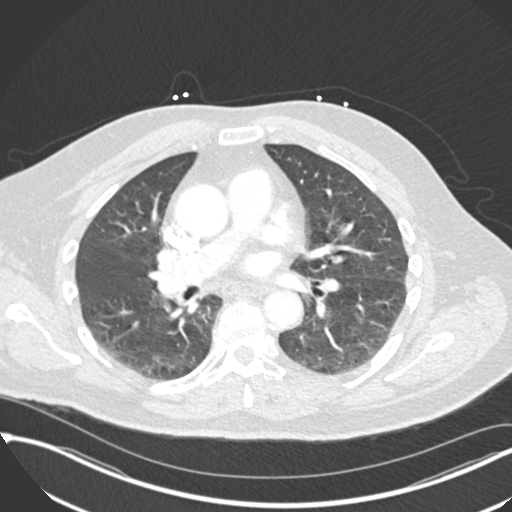
[im 178/289  mediastinal]
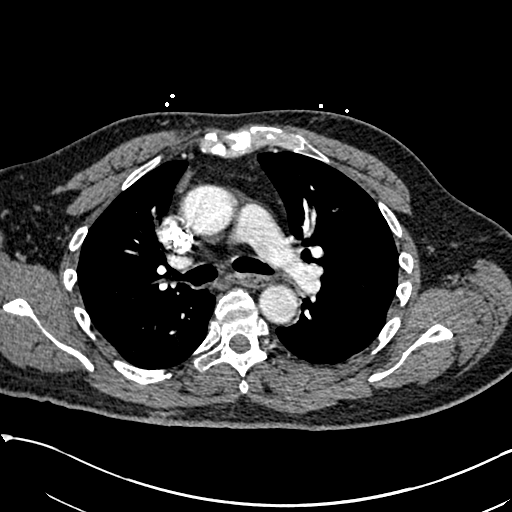
[im 200/289  lung]
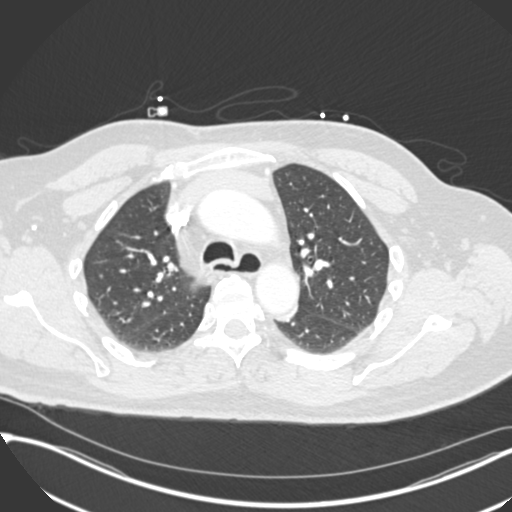
[im 222/289  mediastinal]
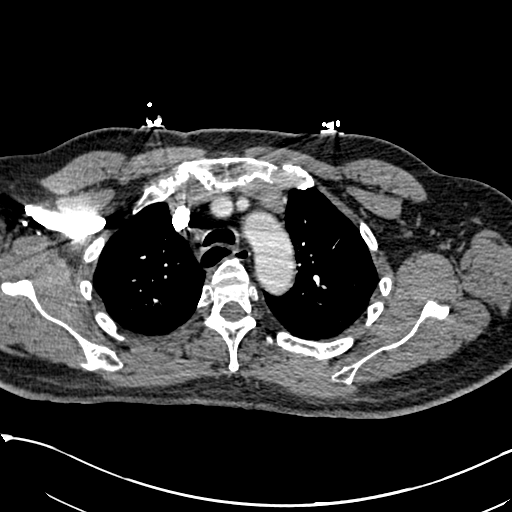
[im 244/289  lung]
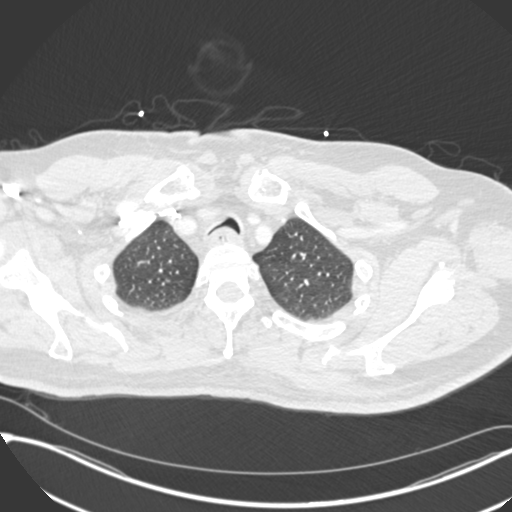
[im 266/289  mediastinal]
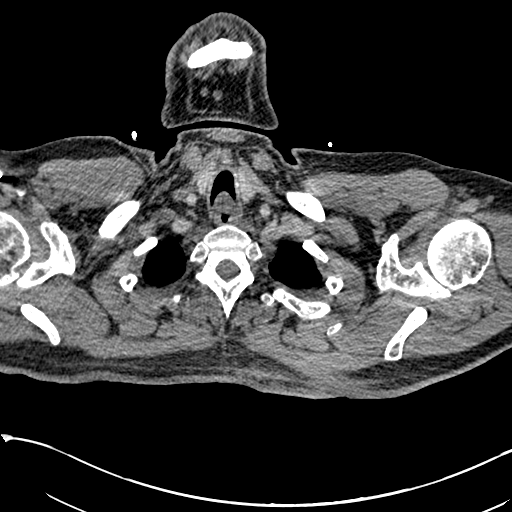

[Series 6: coronal mpr · coronal · 0.64mm/px · 1 of 151 slices shown]
[im 76/151  mediastinal]
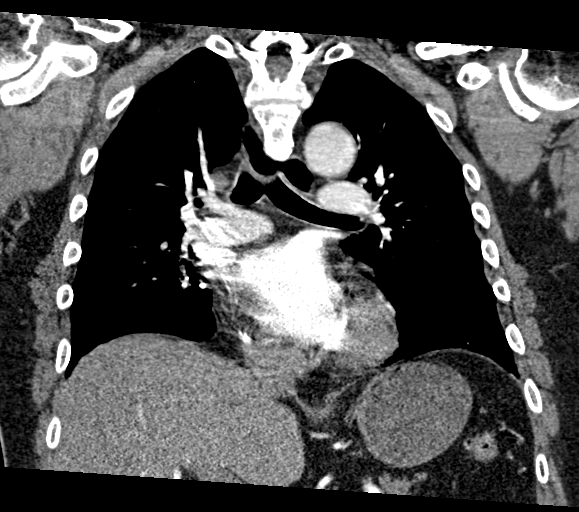

[Series 10: lung · axial · 0.62mm/px · z∈[-193,-41]mm · 4 of 128 slices shown]
[im 26/128  mediastinal]
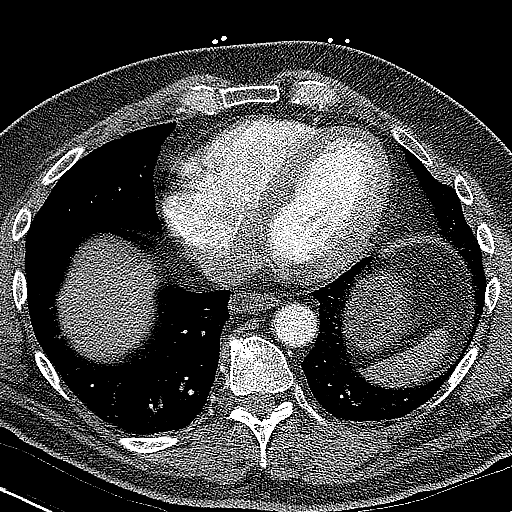
[im 51/128  mediastinal]
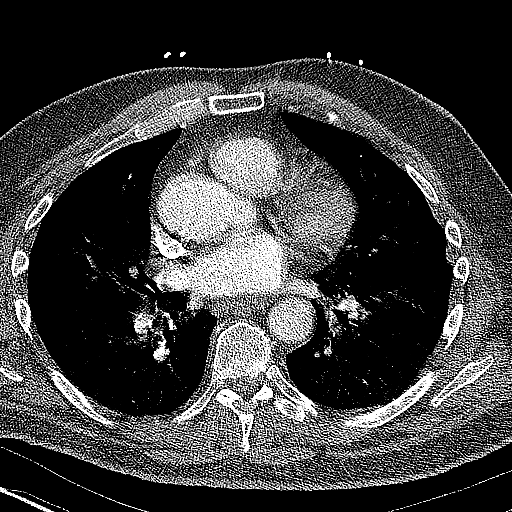
[im 77/128  mediastinal]
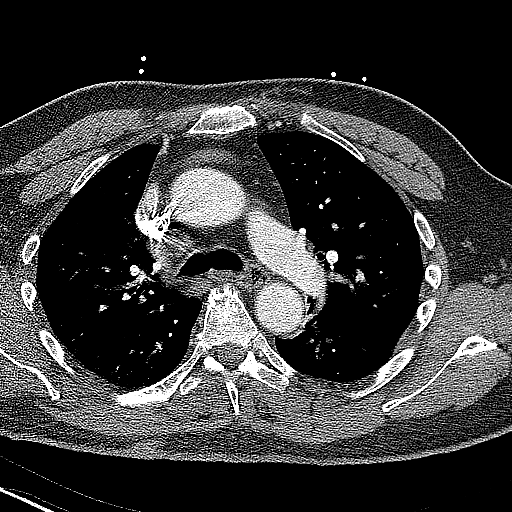
[im 102/128  mediastinal]
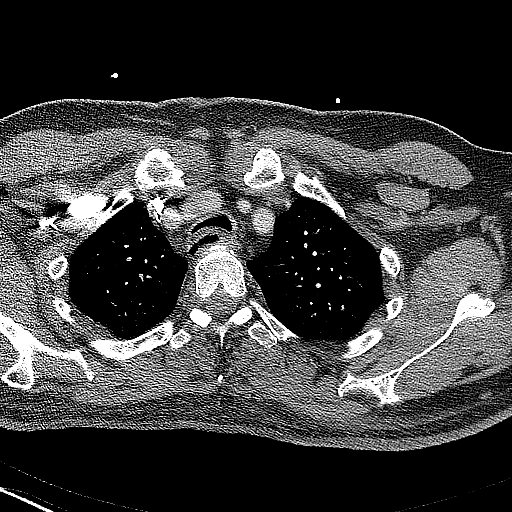

[17 of 36 positions shown; findings below may reference images not displayed]

RADIATION DOSE REDUCTION: This exam was performed according to the
departmental dose-optimization program which includes automated
exposure control, adjustment of the mA and/or kV according to
patient size and/or use of iterative reconstruction technique.

CONTRAST:  75mL OMNIPAQUE IOHEXOL 350 MG/ML SOLN IV
FINDINGS: Cardiovascular: Minimal atherosclerotic calcification aorta and
coronary arteries. Aorta normal caliber without aneurysm or
dissection. Heart unremarkable. No pericardial effusion. Pulmonary
arteries suboptimally opacified with respiratory motion artifacts at
the lower lobes. No large or central pulmonary emboli are
identified. Unable to exclude small peripheral emboli particularly
in the lower lobes.

Mediastinum/Nodes: Base of cervical region normal appearance.
Esophagus unremarkable. No thoracic adenopathy.

Lungs/Pleura: Central peribronchial thickening. Minimal patchy areas
of infiltrate RIGHT lung. No pleural effusion or pneumothorax.

Upper Abdomen: Upper abdomen unremarkable

Musculoskeletal: No acute osseous findings.

Review of the MIP images confirms the above findings.
IMPRESSION: No evidence of large or central pulmonary emboli, with exam
limitations of exam due to respiratory motion and suboptimal
pulmonary arterial opacification.

Central peribronchial thickening with minimal patchy areas of
infiltrate in RIGHT lung question pneumonia.

## 2021-12-09 MED ORDER — LACTATED RINGERS IV SOLN: INTRAVENOUS | Status: DC

## 2021-12-09 MED ORDER — ATORVASTATIN CALCIUM 10 MG PO TABS: 10.0000 mg | ORAL_TABLET | Freq: Every day | ORAL | Status: DC

## 2021-12-09 MED ORDER — SODIUM CHLORIDE 0.9 % IV SOLN
1.0000 g | Freq: Once | INTRAVENOUS | Status: AC
  Administered 2021-12-09: 1 g via INTRAVENOUS
  Filled 2021-12-09: qty 10

## 2021-12-09 MED ORDER — FENTANYL CITRATE PF 50 MCG/ML IJ SOSY
50.0000 ug | PREFILLED_SYRINGE | Freq: Once | INTRAMUSCULAR | Status: AC
  Administered 2021-12-09: 50 ug via INTRAVENOUS
  Filled 2021-12-09: qty 1

## 2021-12-09 MED ORDER — SENNOSIDES-DOCUSATE SODIUM 8.6-50 MG PO TABS: 1.0000 | ORAL_TABLET | Freq: Every evening | ORAL | Status: DC | PRN

## 2021-12-09 MED ORDER — ONDANSETRON HCL 4 MG/2ML IJ SOLN
4.0000 mg | Freq: Four times a day (QID) | INTRAMUSCULAR | Status: DC | PRN
  Administered 2021-12-11: 4 mg via INTRAVENOUS
  Filled 2021-12-09: qty 2

## 2021-12-09 MED ORDER — IOHEXOL 350 MG/ML SOLN
75.0000 mL | Freq: Once | INTRAVENOUS | Status: AC | PRN
  Administered 2021-12-09: 75 mL via INTRAVENOUS

## 2021-12-09 MED ORDER — METOPROLOL TARTRATE 12.5 MG HALF TABLET
12.5000 mg | ORAL_TABLET | Freq: Two times a day (BID) | ORAL | Status: DC
Start: 2021-12-10 — End: 2021-12-16
  Administered 2021-12-09 – 2021-12-15 (×13): 12.5 mg via ORAL
  Filled 2021-12-09 (×13): qty 1

## 2021-12-09 MED ORDER — AMLODIPINE BESYLATE 5 MG PO TABS
5.0000 mg | ORAL_TABLET | Freq: Every day | ORAL | Status: DC
  Administered 2021-12-10 – 2021-12-15 (×6): 5 mg via ORAL
  Filled 2021-12-09 (×6): qty 1

## 2021-12-09 MED ORDER — LOSARTAN POTASSIUM 50 MG PO TABS: 50.0000 mg | ORAL_TABLET | Freq: Every day | ORAL | Status: DC

## 2021-12-09 MED ORDER — ALBUTEROL SULFATE (2.5 MG/3ML) 0.083% IN NEBU
3.0000 mL | INHALATION_SOLUTION | RESPIRATORY_TRACT | Status: DC | PRN
  Administered 2021-12-12 (×2): 3 mL via RESPIRATORY_TRACT
  Filled 2021-12-09 (×2): qty 3

## 2021-12-09 MED ORDER — MAGIC MOUTHWASH W/LIDOCAINE
5.0000 mL | Freq: Four times a day (QID) | ORAL | Status: DC | PRN
  Administered 2021-12-10 – 2021-12-15 (×7): 5 mL via ORAL
  Filled 2021-12-09 (×9): qty 5

## 2021-12-09 MED ORDER — GUAIFENESIN-DM 100-10 MG/5ML PO SYRP
10.0000 mL | ORAL_SOLUTION | Freq: Four times a day (QID) | ORAL | Status: DC | PRN
  Administered 2021-12-09 – 2021-12-14 (×9): 10 mL via ORAL
  Filled 2021-12-09 (×9): qty 10

## 2021-12-09 MED ORDER — ACETAMINOPHEN 325 MG PO TABS
650.0000 mg | ORAL_TABLET | Freq: Four times a day (QID) | ORAL | Status: DC | PRN
  Administered 2021-12-10 – 2021-12-15 (×4): 650 mg via ORAL
  Filled 2021-12-09 (×4): qty 2

## 2021-12-09 MED ORDER — SODIUM CHLORIDE 0.9 % IV BOLUS
1000.0000 mL | Freq: Once | INTRAVENOUS | Status: AC
Start: 2021-12-09 — End: 2021-12-09
  Administered 2021-12-09: 1000 mL via INTRAVENOUS

## 2021-12-09 MED ORDER — DULOXETINE HCL 30 MG PO CPEP: 30.0000 mg | ORAL_CAPSULE | Freq: Every day | ORAL | Status: DC

## 2021-12-09 MED ORDER — INSULIN ASPART 100 UNIT/ML IJ SOLN
0.0000 [IU] | Freq: Three times a day (TID) | INTRAMUSCULAR | Status: DC
  Administered 2021-12-10 (×2): 5 [IU] via SUBCUTANEOUS
  Administered 2021-12-10: 3 [IU] via SUBCUTANEOUS
  Administered 2021-12-10: 5 [IU] via SUBCUTANEOUS
  Administered 2021-12-11: 2 [IU] via SUBCUTANEOUS
  Administered 2021-12-11: 8 [IU] via SUBCUTANEOUS
  Administered 2021-12-11: 3 [IU] via SUBCUTANEOUS
  Administered 2021-12-12: 5 [IU] via SUBCUTANEOUS
  Administered 2021-12-12 (×2): 3 [IU] via SUBCUTANEOUS
  Administered 2021-12-12: 5 [IU] via SUBCUTANEOUS
  Administered 2021-12-13: 3 [IU] via SUBCUTANEOUS
  Administered 2021-12-13: 5 [IU] via SUBCUTANEOUS
  Administered 2021-12-13: 3 [IU] via SUBCUTANEOUS
  Administered 2021-12-13: 8 [IU] via SUBCUTANEOUS
  Administered 2021-12-14 (×4): 3 [IU] via SUBCUTANEOUS
  Administered 2021-12-15: 5 [IU] via SUBCUTANEOUS
  Administered 2021-12-15: 3 [IU] via SUBCUTANEOUS
  Administered 2021-12-15 (×2): 5 [IU] via SUBCUTANEOUS

## 2021-12-09 MED ORDER — SODIUM CHLORIDE 0.9 % IV BOLUS (SEPSIS)
1000.0000 mL | Freq: Once | INTRAVENOUS | Status: AC
  Administered 2021-12-09: 1000 mL via INTRAVENOUS

## 2021-12-09 MED ORDER — SODIUM CHLORIDE 0.9 % IV SOLN
2.0000 g | INTRAVENOUS | Status: AC
  Administered 2021-12-10 – 2021-12-14 (×5): 2 g via INTRAVENOUS
  Filled 2021-12-09 (×5): qty 20

## 2021-12-09 MED ORDER — ENOXAPARIN SODIUM 40 MG/0.4ML IJ SOSY
40.0000 mg | PREFILLED_SYRINGE | INTRAMUSCULAR | Status: DC
  Administered 2021-12-10 – 2021-12-15 (×6): 40 mg via SUBCUTANEOUS
  Filled 2021-12-09 (×6): qty 0.4

## 2021-12-09 MED ORDER — ACETAMINOPHEN 650 MG RE SUPP: 650.0000 mg | Freq: Four times a day (QID) | RECTAL | Status: DC | PRN

## 2021-12-09 MED ORDER — GABAPENTIN 400 MG PO CAPS
400.0000 mg | ORAL_CAPSULE | Freq: Three times a day (TID) | ORAL | Status: DC
  Administered 2021-12-10 – 2021-12-15 (×19): 400 mg via ORAL
  Filled 2021-12-09 (×18): qty 1

## 2021-12-09 MED ORDER — KETOROLAC TROMETHAMINE 30 MG/ML IJ SOLN
30.0000 mg | Freq: Once | INTRAMUSCULAR | Status: AC
  Administered 2021-12-09: 30 mg via INTRAVENOUS
  Filled 2021-12-09: qty 1

## 2021-12-09 MED ORDER — ONDANSETRON HCL 4 MG PO TABS: 4.0000 mg | ORAL_TABLET | Freq: Four times a day (QID) | ORAL | Status: DC | PRN

## 2021-12-09 MED ORDER — PHENOL 1.4 % MT LIQD
1.0000 | OROMUCOSAL | Status: DC | PRN
  Administered 2021-12-10 (×2): 1 via OROMUCOSAL
  Filled 2021-12-09: qty 177

## 2021-12-09 MED ORDER — ASPIRIN 81 MG PO CHEW
81.0000 mg | CHEWABLE_TABLET | Freq: Every day | ORAL | Status: DC
Start: 2021-12-10 — End: 2021-12-16
  Administered 2021-12-10 – 2021-12-15 (×6): 81 mg via ORAL
  Filled 2021-12-09 (×6): qty 1

## 2021-12-09 MED ORDER — ONDANSETRON HCL 4 MG/2ML IJ SOLN
4.0000 mg | Freq: Once | INTRAMUSCULAR | Status: AC
  Administered 2021-12-09: 4 mg via INTRAVENOUS
  Filled 2021-12-09: qty 2

## 2021-12-09 MED ORDER — MELATONIN 5 MG PO TABS
5.0000 mg | ORAL_TABLET | Freq: Once | ORAL | Status: AC
  Administered 2021-12-09: 5 mg via ORAL
  Filled 2021-12-09: qty 1

## 2021-12-09 MED ORDER — AZITHROMYCIN 250 MG PO TABS
500.0000 mg | ORAL_TABLET | Freq: Once | ORAL | Status: AC
  Administered 2021-12-09: 500 mg via ORAL
  Filled 2021-12-09: qty 2

## 2021-12-09 MED ORDER — AZITHROMYCIN 250 MG PO TABS
500.0000 mg | ORAL_TABLET | Freq: Every day | ORAL | Status: DC
  Administered 2021-12-10 – 2021-12-12 (×3): 500 mg via ORAL
  Filled 2021-12-09 (×3): qty 2

## 2021-12-09 NOTE — Assessment & Plan Note (Addendum)
Fairly controlled continue patient's amlodipine metoprolol.  Will defer from initiating new antihypertensive agents (ARBs) in the setting of acute illness and will need PCP follow-up. ?

## 2021-12-09 NOTE — Assessment & Plan Note (Addendum)
Uncontrolled hyperglycemia with HbA1c 11.3.  On Ozempic and metformin which will be held , continue on SSI for now ?Recent Labs  ?Lab 12/10/21 ?[id number] 12/10/21 ?[id number] 12/10/21 ?1124 12/10/21 ?1419 12/10/21 ?1541 12/10/21 ?2052 12/11/21 ?[id number]  ?GLUCAP  --    < > 198* 203* 233* 203* 150*  ?HGBA1C 11.3*  --   --   --   --   --   --   ? < > = values in this interval not displayed.  ? ?

## 2021-12-09 NOTE — Assessment & Plan Note (Addendum)
Left BKA with prosthesis.  Supportive care.   ?

## 2021-12-09 NOTE — Assessment & Plan Note (Addendum)
Intractable cough with sore throat ?Wheezing ?Bronchitis: ?CT angio shows central peribronchial thickening with minimal patchy areas of infiltrate in the right lung suspected community-acquired pneumonia.Leukocytosis improved afebrile.  Patient reports she was "coughing all night and could not sleep".  Does not feel ready for discharge yet, added Hycodan, continue Mucinex/antitussives, needing Percocet for sore throat. strep ag neg- pending Legionella antigen. cont rocephin/azithro, OOB I-S, loratidine, low-dose prednisone, cepacol, Flonase, and Chloraseptic spray. ?

## 2021-12-09 NOTE — ED Triage Notes (Signed)
BIB EMS with reported fever, body aches and wheezing, clear on ausculation. 129/[id number]% RA CBG 269. Pt has not tested for Covid or took any meds for symptoms ?

## 2021-12-09 NOTE — Assessment & Plan Note (Addendum)
Mild hyponatremia and stable.   ?

## 2021-12-09 NOTE — ED Provider Notes (Signed)
[facility] DEPT Provider Note   CSN: [id number] Arrival date & time: 12/09/21  1352     History  Chief Complaint  Patient presents with   Covid Symptoms   Fever   Generalized Body Aches    Brett White is a 61 y.o. male who  has a past medical history of GERD (gastroesophageal reflux disease), HTN (hypertension), Insulin dependent type 2 diabetes mellitus ([place]), Neuropathy, and Stroke ([place]). He is s/p Left BK due to osteomyelitis. He presents with a cc of URI sxs. The patient has had 3 days of cough, body aches, neck pain, headache, and subjective fever. He took tylenol without relief. He denies nausea, vomiting and diarrhea.   Fever     Home Medications Prior to Admission medications   Medication Sig Start Date End Date Taking? Authorizing Provider  acetaminophen (TYLENOL) 500 MG tablet Take 2 tablets (1,000 mg total) by mouth every 8 (eight) hours as needed. 10/20/21   Ghimire, Henreitta Leber, MD  amLODipine (NORVASC) 5 MG tablet Take 1 tablet (5 mg total) by mouth daily. 10/20/21   Ghimire, Henreitta Leber, MD  aspirin 81 MG chewable tablet Chew 1 tablet (81 mg total) by mouth daily. 10/20/21   Ghimire, Henreitta Leber, MD  atorvastatin (LIPITOR) 10 MG tablet Take 1 tablet (10 mg total) by mouth daily. 10/20/21   Ghimire, Henreitta Leber, MD  DULoxetine (CYMBALTA) 30 MG capsule Take 1 capsule (30 mg total) by mouth daily. 10/20/21   Ghimire, Henreitta Leber, MD  gabapentin (NEURONTIN) 400 MG capsule Take 1 capsule (400 mg total) by mouth 3 (three) times daily. 10/20/21   Ghimire, Henreitta Leber, MD  HYDROmorphone (DILAUDID) 2 MG tablet Take 1 tablet (2 mg total) by mouth every 4 (four) hours as needed for severe pain. [id number]   Delora Fuel, MD  insulin aspart protamine - aspart (NOVOLOG MIX 70/30 FLEXPEN) (70-30) 100 UNIT/ML FlexPen Inject 16 units twice daily as directed. 10/20/21   Ghimire, Henreitta Leber, MD  Insulin Pen Needle 32G X 4 MM MISC Use to inject insulin up to 4 times daily  as needed. 10/20/21   Ghimire, Henreitta Leber, MD  metFORMIN (GLUCOPHAGE) 500 MG tablet Take 1 tablet (500 mg total) by mouth daily with breakfast. 10/20/21   Ghimire, Henreitta Leber, MD  metoprolol tartrate (LOPRESSOR) 25 MG tablet Take 1/2 tablet (12.5 mg total) by mouth 2 (two) times daily. 10/20/21   Ghimire, Henreitta Leber, MD  naproxen (NAPROSYN) 500 MG tablet Take 1 tablet (500 mg total) by mouth 2 (two) times daily. [id number]   Delora Fuel, MD  ondansetron (ZOFRAN) 4 MG tablet Take 1 tablet (4 mg total) by mouth every 6 (six) hours as needed for nausea. [id number]   Delora Fuel, MD  Oxycodone HCl 10 MG TABS Take 1 tablet (10 mg total) by mouth every 6 (six) hours as needed. 10/20/21   Ghimire, Henreitta Leber, MD  pantoprazole (PROTONIX) 40 MG tablet Take 1 tablet (40 mg total) by mouth daily. 10/20/21   Ghimire, Henreitta Leber, MD  polyethylene glycol powder (GLYCOLAX/MIRALAX) 17 GM/SCOOP powder Take 1 capful (17 g) by mouth 2 (two) times daily. 10/20/21   Ghimire, Henreitta Leber, MD  senna-docusate (SENOKOT-S) 8.6-50 MG tablet Take 2 tablets by mouth 2 (two) times daily. 10/20/21   Ghimire, Henreitta Leber, MD      Allergies    Patient has no known allergies.    Review of Systems   Review of Systems  Constitutional:  Positive for fever.  Physical Exam Updated Vital Signs BP (!) 137/97    Pulse (!) 105    Temp 98.5 F (36.9 C) (Oral)    Resp 17    Ht 6\' 2"  (1.88 m)    Wt 97.5 kg    SpO2 93%    BMI 27.60 kg/m  Physical Exam Vitals and nursing note reviewed.  Constitutional:      General: He is not in acute distress.    Appearance: He is well-developed. He is not diaphoretic.  HENT:     Head: Normocephalic and atraumatic.  Eyes:     General: No scleral icterus.    Conjunctiva/sclera: Conjunctivae normal.  Cardiovascular:     Rate and Rhythm: Regular rhythm. Tachycardia present.     Heart sounds: Normal heart sounds.  Pulmonary:     Effort: Pulmonary effort is normal. Tachypnea present. No respiratory distress.      Breath sounds: Wheezing present.  Abdominal:     Palpations: Abdomen is soft.     Tenderness: There is no abdominal tenderness.  Musculoskeletal:     Cervical back: Normal range of motion and neck supple.  Skin:    General: Skin is warm and dry.  Neurological:     Mental Status: He is alert.  Psychiatric:        Behavior: Behavior normal.    ED Results / Procedures / Treatments   Labs (all labs ordered are listed, but only abnormal results are displayed) Labs Reviewed  COMPREHENSIVE METABOLIC PANEL - Abnormal; Notable for the following components:      Result Value   Sodium 132 (*)    Glucose, Bld 267 (*)    BUN 22 (*)    All other components within normal limits  CBC WITH DIFFERENTIAL/PLATELET - Abnormal; Notable for the following components:   WBC 13.3 (*)    Neutro Abs 11.2 (*)    All other components within normal limits  URINALYSIS, ROUTINE W REFLEX MICROSCOPIC - Abnormal; Notable for the following components:   Glucose, UA >=500 (*)    Protein, ur 100 (*)    All other components within normal limits  RESP PANEL BY RT-PCR (FLU A&B, COVID) ARPGX2  CULTURE, BLOOD (ROUTINE X 2)  CULTURE, BLOOD (ROUTINE X 2)  LACTIC ACID, PLASMA  LACTIC ACID, PLASMA  PROTIME-INR  APTT    EKG EKG Interpretation  Date/Time:  Saturday December 09 2021 15:37:13 EST Ventricular Rate:  118 PR Interval:  148 QRS Duration: 93 QT Interval:  322 QTC Calculation: 452 R Axis:   -30 Text Interpretation: Sinus tachycardia Left axis deviation Abnormal R-wave progression, early transition Abnormal ECG Confirmed by Gerhard Munch [phone]) on 12/09/2021 4:11:48 PM  Radiology CT Angio Chest PE W and/or Wo Contrast  Result Date: 12/09/2021 CLINICAL DATA:  Fever, body aches, wheezing, hyperglycemia, question pulmonary embolism EXAM: CT ANGIOGRAPHY CHEST WITH CONTRAST TECHNIQUE: Multidetector CT imaging of the chest was performed using the standard protocol during bolus administration of intravenous  contrast. Multiplanar CT image reconstructions and MIPs were obtained to evaluate the vascular anatomy. RADIATION DOSE REDUCTION: This exam was performed according to the departmental dose-optimization program which includes automated exposure control, adjustment of the mA and/or kV according to patient size and/or use of iterative reconstruction technique. CONTRAST:  19mL OMNIPAQUE IOHEXOL 350 MG/ML SOLN IV COMPARISON:  None FINDINGS: Cardiovascular: Minimal atherosclerotic calcification aorta and coronary arteries. Aorta normal caliber without aneurysm or dissection. Heart unremarkable. No pericardial effusion. Pulmonary arteries suboptimally opacified with respiratory motion artifacts at the  lower lobes. No large or central pulmonary emboli are identified. Unable to exclude small peripheral emboli particularly in the lower lobes. Mediastinum/Nodes: Base of cervical region normal appearance. Esophagus unremarkable. No thoracic adenopathy. Lungs/Pleura: Central peribronchial thickening. Minimal patchy areas of infiltrate RIGHT lung. No pleural effusion or pneumothorax. Upper Abdomen: Upper abdomen unremarkable Musculoskeletal: No acute osseous findings. Review of the MIP images confirms the above findings. IMPRESSION: No evidence of large or central pulmonary emboli, with exam limitations of exam due to respiratory motion and suboptimal pulmonary arterial opacification. Central peribronchial thickening with minimal patchy areas of infiltrate in RIGHT lung question pneumonia. Electronically Signed   By: Lavonia Dana M.D.   On: 12/09/2021 19:32   DG Chest Port 1 View  Result Date: 12/09/2021 CLINICAL DATA:  Questionable sepsis - evaluate for abnormality EXAM: PORTABLE CHEST 1 VIEW COMPARISON:  10/03/2021 FINDINGS: The heart size and mediastinal contours are within normal limits and stable. Both lungs are clear. The visualized skeletal structures are unremarkable. IMPRESSION: No active disease. Electronically  Signed   By: Davina Poke D.O.   On: 12/09/2021 14:48    Procedures Procedures    Medications Ordered in ED Medications  cefTRIAXone (ROCEPHIN) 1 g in sodium chloride 0.9 % 100 mL IVPB (1 g Intravenous New Bag/Given 12/09/21 2005)  sodium chloride 0.9 % bolus 1,000 mL (0 mLs Intravenous Stopped 12/09/21 1848)  fentaNYL (SUBLIMAZE) injection 50 mcg (50 mcg Intravenous Given 12/09/21 1514)  ondansetron (ZOFRAN) injection 4 mg (4 mg Intravenous Given 12/09/21 1514)  sodium chloride 0.9 % bolus 1,000 mL (1,000 mLs Intravenous New Bag/Given 12/09/21 1636)  fentaNYL (SUBLIMAZE) injection 50 mcg (50 mcg Intravenous Given 12/09/21 1648)  ondansetron (ZOFRAN) injection 4 mg (4 mg Intravenous Given 12/09/21 1846)  iohexol (OMNIPAQUE) 350 MG/ML injection 75 mL (75 mLs Intravenous Contrast Given 12/09/21 1904)  azithromycin (ZITHROMAX) tablet 500 mg (500 mg Oral Given 12/09/21 2004)    ED Course/ Medical Decision Making/ A&P Clinical Course as of 12/09/21 2020  Sat Dec 09, 2021  1518 WBC(!): 13.3 [AH]  1610 Urinalysis, Routine w reflex microscopic(!) No evidence of infection [AH]  1856 Lactic acid, plasma Lactic acid within normal [AH]  1856 Resp Panel by RT-PCR (Flu A&B, Covid) Nasopharyngeal Swab Respiratory panel negative [AH]  1856 APTT [AH]  1856 Protime-INR Coags normal [AH]  1856 Comprehensive metabolic panel(!) CMP with a blood sugar of 267 [AH]  1856 DG Chest Port 1 View I visualized the chest x-ray, no acute findings [AH]  1856 ED EKG 12-Lead [AH]  1856 EKG shows sinus tachycardia at a rate of 118 [AH]  1857 Resp(!): 26 [AH]  1857 SpO2: 93 % [AH]    Clinical Course User Index [AH] Margarita Mail, PA-C                           Medical Decision Making 61 year old male who presents the emergency department with chief complaint of URI symptoms.  Patient has been given 3 L of fluid with persistent tachycardia, borderline hypoxia at times down to 90% and persistent tachypnea.  The  patient does not appear to be positive for COVID or influenza.  He does not appear to be volume overloaded with pulmonary embolus or have any other signs or symptoms of systemic infection besides respiratory symptoms.  He appears to have early pneumonia on CT imaging which is negative for pulmonary embolus.  I have asked Dr. Tonie Griffith to admit the patient for observation given his  history of poorly controlled insulin-dependent diabetes and recent history of amputation due to poor control.  Amount and/or Complexity of Data Reviewed Labs: ordered. Decision-making details documented in ED Course. Radiology: ordered and independent interpretation performed. Decision-making details documented in ED Course.    Details: I visualized images of a chest x-ray and CT angiogram which show no acute findings on chest x-ray and early pneumonia on CT imaging ECG/medicine tests: ordered and independent interpretation performed. Decision-making details documented in ED Course.  Risk Prescription drug management. Decision regarding hospitalization.  Final Clinical Impression(s) / ED Diagnoses Final diagnoses:  Community acquired pneumonia, unspecified laterality    Rx / DC Orders ED Discharge Orders     None         Margarita Mail, PA-C 12/09/21 2020    Carmin Muskrat, MD 12/10/21 1351

## 2021-12-09 NOTE — Assessment & Plan Note (Addendum)
In the setting of acute illness pneumonia, heart rate improved.  Patient is on metoprolol home dose.   ?

## 2021-12-09 NOTE — H&P (Signed)
History and Physical    Patient: Brett White DOB: Dec 03, 1960 DOA: 12/09/2021 DOS: the patient was seen and examined on 12/09/2021 PCP: Loura BackNguyen, Kim, NP  Patient coming from: Home  Chief Complaint:  Chief Complaint  Patient presents with   Covid Symptoms   Fever   Generalized Body Aches   HPI: Brett White is a 61 y.o. male with medical history significant of DMT2 with neuropathy, HTN, hx of CVA, hx of osteomyelitis s/p BKA left leg who presents with 2-day history of cough.  He reports that cough was more severe last night and he had 3-4 episodes of vomiting last night.  He still has some nausea but is able to drink in the emergency room and hold it down.  He reports he has been having body aches with a cough.  He reports having chills and subjective fever last night.  He took Tylenol last night which helped for a little while he states.  States he has had decreased appetite for the last 2 days as a sore scratchy throat from coughing.  Cough is nonproductive.  States he has had shortness of breath with exertion but it goes away when he rests.  He has not had orthopnea.  Denies any edema of extremities. No hx of DVT or PE He lives by himself and he has no known sick contacts.  Denies tobacco alcohol or illicit drug use.  Review of Systems: As mentioned in the history of present illness. All other systems reviewed and are negative. Past Medical History:  Diagnosis Date   GERD (gastroesophageal reflux disease)    HTN (hypertension)    Insulin dependent type 2 diabetes mellitus (HCC)    Neuropathy    Stroke [facility](HCC)    Past Surgical History:  Procedure Laterality Date   AMPUTATION Left 02/04/2021   Procedure: LEFT GREAT TOE AMPUTATION;  Surgeon: Nadara Mustarduda, Marcus V, MD;  [facility];  Service: Orthopedics;  Laterality: Left;   AMPUTATION Left 02/10/2021   Procedure: LEFT FOOT 1ST RAY AMPUTATION;  Surgeon: Nadara Mustarduda, Marcus V, MD;  [facility];  Service: Orthopedics;   Laterality: Left;   AMPUTATION Left 03/22/2021   Procedure: LEFT BELOW KNEE AMPUTATION;  Surgeon: Nadara Mustarduda, Marcus V, MD;  [facility];  Service: Orthopedics;  Laterality: Left;   APPENDECTOMY     BACK SURGERY     I & D EXTREMITY Left 03/03/2021   Procedure: LEFT FOOT DEBRIDEMENT;  Surgeon: Nadara Mustarduda, Marcus V, MD;  [facility];  Service: Orthopedics;  Laterality: Left;   INCISION AND DRAINAGE ABSCESS N/A 10/06/2021   Procedure: INCISION AND DRAINAGE BACK ABSCESS;  Surgeon: Berna Bueonnor, Chelsea A, MD;  Location: [facility] OR;  Service: General;  Laterality: N/A;   removal of back cyst     TONSILLECTOMY     Social History:  reports that he has never smoked. He has never used smokeless tobacco. He reports that he does not currently use alcohol. He reports that he does not currently use drugs.  No Known Allergies  Family History  Problem Relation Age of Onset   Heart attack Neg Hx     Prior to Admission medications   Medication Sig Start Date End Date Taking? Authorizing Provider  acetaminophen (TYLENOL) 500 MG tablet Take 2 tablets (1,000 mg total) by mouth every 8 (eight) hours as needed. 10/20/21   Ghimire, Werner LeanShanker M, MD  amLODipine (NORVASC) 5 MG tablet Take 1 tablet (5 mg total) by mouth daily. 10/20/21   Ghimire, Werner LeanShanker M, MD  aspirin 81 MG chewable tablet Chew 1 tablet (81 mg total) by mouth daily. 10/20/21   Ghimire, Werner Lean, MD  atorvastatin (LIPITOR) 10 MG tablet Take 1 tablet (10 mg total) by mouth daily. 10/20/21   Ghimire, Werner Lean, MD  DULoxetine (CYMBALTA) 30 MG capsule Take 1 capsule (30 mg total) by mouth daily. 10/20/21   Ghimire, Werner Lean, MD  gabapentin (NEURONTIN) 400 MG capsule Take 1 capsule (400 mg total) by mouth 3 (three) times daily. 10/20/21   Ghimire, Werner Lean, MD  HYDROmorphone (DILAUDID) 2 MG tablet Take 1 tablet (2 mg total) by mouth every 4 (four) hours as needed for severe pain. 10/26/21   Dione Booze, MD  insulin aspart protamine - aspart (NOVOLOG MIX 70/30 FLEXPEN)  (70-30) 100 UNIT/ML FlexPen Inject 16 units twice daily as directed. 10/20/21   Ghimire, Werner Lean, MD  Insulin Pen Needle 32G X 4 MM MISC Use to inject insulin up to 4 times daily as needed. 10/20/21   Ghimire, Werner Lean, MD  metFORMIN (GLUCOPHAGE) 500 MG tablet Take 1 tablet (500 mg total) by mouth daily with breakfast. 10/20/21   Ghimire, Werner Lean, MD  metoprolol tartrate (LOPRESSOR) 25 MG tablet Take 1/2 tablet (12.5 mg total) by mouth 2 (two) times daily. 10/20/21   Ghimire, Werner Lean, MD  naproxen (NAPROSYN) 500 MG tablet Take 1 tablet (500 mg total) by mouth 2 (two) times daily. 10/26/21   Dione Booze, MD  ondansetron (ZOFRAN) 4 MG tablet Take 1 tablet (4 mg total) by mouth every 6 (six) hours as needed for nausea. 10/26/21   Dione Booze, MD  Oxycodone HCl 10 MG TABS Take 1 tablet (10 mg total) by mouth every 6 (six) hours as needed. 10/20/21   Ghimire, Werner Lean, MD  pantoprazole (PROTONIX) 40 MG tablet Take 1 tablet (40 mg total) by mouth daily. 10/20/21   Ghimire, Werner Lean, MD  polyethylene glycol powder (GLYCOLAX/MIRALAX) 17 GM/SCOOP powder Take 1 capful (17 g) by mouth 2 (two) times daily. 10/20/21   Ghimire, Werner Lean, MD  senna-docusate (SENOKOT-S) 8.6-50 MG tablet Take 2 tablets by mouth 2 (two) times daily. 10/20/21   Maretta Bees, MD    Physical Exam: Vitals:   12/09/21 1700 12/09/21 1730 12/09/21 2000 12/09/21 2028  BP: (!) 128/92 140/89 (!) 137/97   Pulse: (!) 113 (!) 113 (!) 105   Resp: 19 (!) 26 17   Temp:    98.4 F (36.9 C)  TempSrc:    Oral  SpO2: 92% 93% 93%   Weight:      Height:       General: WDWN, Alert and oriented x3.  Eyes: EOMI, PERRL, conjunctivae normal.  Sclera nonicteric HENT:  [place]/AT, external ears normal.  Nares patent without epistasis.  Mucous membranes are moist. Posterior pharynx erythematous without exudate or drainage Neck: Soft, normal range of motion, supple, no masses, Trachea midline Respiratory: clear to auscultation bilaterally, no  wheezing, no crackles. Normal respiratory effort.  Cardiovascular: Regular rhythm, tachycardia. no murmurs / rubs / gallops. No extremity edema.  Abdomen: Soft, no tenderness, nondistended, no rebound or guarding.  No masses palpated. Bowel sounds normoactive Musculoskeletal: FROM. no cyanosis. Normal muscle tone. Left BKA.  Skin: Warm, dry, intact no rashes, lesions, ulcers. No induration Neurologic: CN 2-12 grossly intact.  Normal speech. Strength 5/5 in all extremities.   Psychiatric: Normal judgment and insight. Normal mood.   Data Reviewed: Lab Work:   BC 32,300 hemoglobin 14.3 hematocrit 43.2 platelets 222,000  sodium 132 which is baseline potassium 4.1 chloride 98 bicarb 26 creatinine 1.12 BUN 22 glucose 267 LFTs normal lactic acid 1.6 initially.  Repeat lactic acid in the ER 1.2 COVID-negative influenza A and B negative urinalysis negative except for moderate protein and large glucose  Chest x-ray shows normal cardiac silhouette.  Lung fields are clear with no infiltrate or consolidation. CT Angiography of chest shows no PE with mild G areas of infiltrate in the right lung and central peribronchial thickening  EKG shows sinus tachycardia with no acute ST elevation or depression.  QTc 452  Assessment and Plan: * CAP (community acquired pneumonia) Brett White is placed on telemetry floor.   Has pneumonia in right lung as evidenced by infiltrates CT scan.  Started on treatment with rocephin and azithromycin  Supplemental oxygen to keep O2 sat between 92-96%. Currently oxygenating well on room air.  Incentive spirometer every 2 hours while awake.  Check CBC, BMP in am.  IVF hydration with LR at 100 ml/hr Robitussin DM for cough   Type 2 diabetes mellitus with hyperglycemia (HCC) Pt reports he takes a once weekly shot that he thinks is Ompemzic. Will have pharmacy verify home meds Metformin held for 48 hours after receiving IV contrast for CT scan Monitor blood sugar. SSI as needed  for glycemic control Had HgbA1c in early Jan that was 12.2 Continue statin  Essential hypertension Continue norvasc, metoprolol.  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 Secondary to osteomyelitis in past  Advance Care Planning:    Code Status:  Full Code. Lovenox for DVT prophylaxis  Family Communication: Diagnosis and plan discussed with patient.  He verbalized understanding agrees with plan.  Further recommendations to follow as clinical indicated  Author: Carlton Adam, MD 12/09/2021 8:45 PM  For on call review [email or web address].

## 2021-12-10 DIAGNOSIS — J189 Pneumonia, unspecified organism: Secondary | ICD-10-CM | POA: Diagnosis not present

## 2021-12-10 LAB — GLUCOSE, CAPILLARY
Glucose-Capillary: 154 mg/dL — ABNORMAL HIGH (ref 70–99)
Glucose-Capillary: 203 mg/dL — ABNORMAL HIGH (ref 70–99)
Glucose-Capillary: 233 mg/dL — ABNORMAL HIGH (ref 70–99)
Glucose-Capillary: 203 mg/dL — ABNORMAL HIGH (ref 70–99)

## 2021-12-10 LAB — BASIC METABOLIC PANEL
Anion gap: 5 (ref 5–15)
BUN: 24 mg/dL — ABNORMAL HIGH (ref 6–20)
CO2: 25 mmol/L (ref 22–32)
Calcium: 8.3 mg/dL — ABNORMAL LOW (ref 8.9–10.3)
Chloride: 104 mmol/L (ref 98–111)
Creatinine, Ser: 1.11 mg/dL (ref 0.61–1.24)
GFR, Estimated: >60 mL/min (ref >60)
Glucose, Bld: 189 mg/dL — ABNORMAL HIGH (ref 70–99)
Potassium: 4.1 mmol/L (ref 3.5–5.1)
Sodium: 134 mmol/L — ABNORMAL LOW (ref 135–145)

## 2021-12-10 LAB — CBC
HCT: 36.6 % — ABNORMAL LOW (ref 39.0–52.0)
Hemoglobin: 11.7 g/dL — ABNORMAL LOW (ref 13.0–17.0)
MCH: 27.7 pg (ref 26.0–34.0)
MCHC: 32 g/dL (ref 30.0–36.0)
MCV: 86.5 fL (ref 80.0–100.0)
Platelets: 267 10*3/uL (ref 150–400)
RBC: 4.23 MIL/uL (ref 4.22–5.81)
RDW: 13.5 % (ref 11.5–15.5)
WBC: 9.3 10*3/uL (ref 4.0–10.5)
nRBC: 0 % (ref 0.0–0.2)

## 2021-12-10 LAB — STREP PNEUMONIAE URINARY ANTIGEN: Strep Pneumo Urinary Antigen: NEGATIVE

## 2021-12-10 MED ORDER — FLUTICASONE PROPIONATE 50 MCG/ACT NA SUSP
1.0000 | Freq: Every day | NASAL | Status: DC
  Administered 2021-12-11 – 2021-12-15 (×5): 1 via NASAL
  Filled 2021-12-10: qty 16

## 2021-12-10 MED ORDER — OXYCODONE-ACETAMINOPHEN 5-325 MG PO TABS
1.0000 | ORAL_TABLET | Freq: Four times a day (QID) | ORAL | Status: DC | PRN
  Administered 2021-12-10 – 2021-12-15 (×20): 1 via ORAL
  Filled 2021-12-10 (×21): qty 1

## 2021-12-10 MED ORDER — MENTHOL 3 MG MT LOZG
1.0000 | LOZENGE | OROMUCOSAL | Status: DC | PRN
  Filled 2021-12-10 (×2): qty 9

## 2021-12-10 MED ORDER — ATORVASTATIN CALCIUM 10 MG PO TABS
20.0000 mg | ORAL_TABLET | Freq: Every day | ORAL | Status: DC
  Administered 2021-12-10 – 2021-12-15 (×6): 20 mg via ORAL
  Filled 2021-12-10 (×6): qty 2

## 2021-12-10 MED ORDER — MELATONIN 5 MG PO TABS
5.0000 mg | ORAL_TABLET | Freq: Once | ORAL | Status: AC
  Administered 2021-12-10: 5 mg via ORAL
  Filled 2021-12-10: qty 1

## 2021-12-10 MED ORDER — LORATADINE 10 MG PO TABS
10.0000 mg | ORAL_TABLET | Freq: Every day | ORAL | Status: DC
  Administered 2021-12-10 – 2021-12-15 (×6): 10 mg via ORAL
  Filled 2021-12-10 (×6): qty 1

## 2021-12-10 MED ORDER — DULOXETINE HCL 30 MG PO CPEP
30.0000 mg | ORAL_CAPSULE | Freq: Two times a day (BID) | ORAL | Status: DC
  Administered 2021-12-10 – 2021-12-15 (×11): 30 mg via ORAL
  Filled 2021-12-10 (×11): qty 1

## 2021-12-10 MED ORDER — FENTANYL CITRATE PF 50 MCG/ML IJ SOSY: 50.0000 ug | PREFILLED_SYRINGE | Freq: Once | INTRAMUSCULAR | Status: DC

## 2021-12-10 MED ORDER — PREDNISONE 10 MG PO TABS
10.0000 mg | ORAL_TABLET | Freq: Every day | ORAL | Status: AC
  Administered 2021-12-10 – 2021-12-12 (×3): 10 mg via ORAL
  Filled 2021-12-10 (×3): qty 1

## 2021-12-10 NOTE — Plan of Care (Signed)

## 2021-12-10 NOTE — Progress Notes (Signed)
PROGRESS NOTE Brett White  [id number] DOB: 1961/05/17 DOA: 12/09/2021 PCP: Arthur Holms, NP   Brief Narrative/Hospital Course: 61 year old male with DMT2 with neuropathy, HTN, hx of CVA, hx of osteomyelitis s/p BKA left leg who presents with 2-day history of cough, also having body aches with cough subjective fever, scratchy throat and shortness of breath with exertion. In the ED tachycardic, and mildly tachypneic otherwise stable blood pressure and oxygen saturation.  Labs showed leukocytosis 13.3, normal lactic acid COVID-19 influenza negative UA unremarkable, chest x-ray no active disease, CT angio chest no large or central PE, central peribronchial thickening with minimal patchy areas of infiltrate in right lung question pneumonia. Patient was placed on antibiotics and admitted for further management.    Subjective: Seen and examined this morning.  Complains of sore throat asking for pain medication.  Also having bouts of coughing. On room air. Assessment and Plan: * CAP (community acquired pneumonia) Intractable cough with sore throat Wheezing: CT angios shows central peribronchial thickening with minimal patchy areas of infiltrate in the right lung suspected community-acquired pneumonia.  Leukocytosis improved afebrile. keep on ceftriaxone/azithromycin follow-up Blood culture.  Check a strep and Legionella antigen.  OOB I-S, antitussives will be continued. Add loratidine, low to the steroid, Cepacol, Flonase.  Continue supportive care  Type 2 diabetes mellitus with hyperglycemia ([place]) On Ozempic and metformin which will be held keep on SSI for now.  Blood sugar stable. Recent Labs  Lab 12/10/21 0738  GLUCAP 154*    Essential hypertension Fairly controlled continue patient's amlodipine metoprolol.  Will defer from initiating new antihypertensive agents (ARBs) in the setting of acute illness and will need PCP follow-up.  Tachycardia In the setting of acute illness pneumonia,  heart rate improved.  Patient is on metoprolol home dose.    Chronic hyponatremia Mild hyponatremia and stable.    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  Supportive care.    DVT prophylaxis: enoxaparin (LOVENOX) injection 40 mg Start: 12/10/21 1000 Code Status:   Code Status: Full Code Family Communication: plan of care discussed with patient at bedside.  Disposition: Currently not medically stable for discharge. Status is: Observation Remains hospitalized for ongoing management of pneumonia shortness of breath cough.  Objective: Vitals last 24 hrs: Vitals:   12/09/21 2154 12/10/21 0312 12/10/21 0551 12/10/21 1103  BP: (!) 154/94 (!) 150/97 (!) 147/96 (!) 156/99  Pulse: 99 82 83 84  Resp: 18 16 18 18   Temp: 98.2 F (36.8 C) 98 F (36.7 C) 98.1 F (36.7 C) 98 F (36.7 C)  TempSrc: Oral Oral Oral Oral  SpO2: 94% 93% 95% 94%  Weight:      Height:       Weight change:   Physical Examination: General exam: AA oriented x3,older than stated age, weak appearing. HEENT:Oral mucosa moist, Ear/Nose WNL grossly, dentition normal. Respiratory system: bilaterally diminished BS with mild wheezing , no use of accessory muscle Cardiovascular system: S1 & S2 +, No JVD,. Gastrointestinal system: Abdomen soft,NT,ND, BS+ Nervous System:Alert, awake, moving extremities and grossly nonfocal Extremities: LE edema  none,distal peripheral pulses palpable.  Skin: No rashes,no icterus. MSK: Normal muscle bulk,tone, power  Medications reviewed:  Scheduled Meds:  amLODipine  5 mg Oral Daily   aspirin  81 mg Oral Daily   atorvastatin  10 mg Oral Daily   azithromycin  500 mg Oral Daily   DULoxetine  30 mg Oral Daily   enoxaparin (LOVENOX) injection  40 mg Subcutaneous Q24H  fentaNYL (SUBLIMAZE) injection  50 mcg Intravenous Once   fluticasone  1 spray Each Nare Daily   gabapentin  400 mg Oral TID   insulin aspart  0-15 Units Subcutaneous TID WC & HS    loratadine  10 mg Oral Daily   metoprolol tartrate  12.5 mg Oral BID   predniSONE  10 mg Oral Q breakfast   Continuous Infusions:  cefTRIAXone (ROCEPHIN)  IV     lactated ringers 100 mL/hr at 12/10/21 1045      Diet Order             Diet heart healthy/carb modified Room service appropriate? Yes; Fluid consistency: Thin  Diet effective now                            Intake/Output Summary (Last 24 hours) at 12/10/2021 1129 Last data filed at 12/10/2021 0500 Gross per 24 hour  Intake 1100 ml  Output 400 ml  Net 700 ml   Net IO Since Admission: 700 mL [12/10/21 1129]  Wt Readings from Last 3 Encounters:  12/09/21 97.5 kg  10/25/21 95.3 kg  10/06/21 95.3 kg     Unresulted Labs (From admission, onward)     Start     Ordered   12/10/21 1007  Respiratory (~20 pathogens) panel by PCR  (Respiratory panel by PCR (~20 pathogens, ~24 hr TAT)  w precautions)  Once,   R        12/10/21 1006   12/10/21 0856  Legionella Pneumophila Serogp 1 Ur Ag  Once,   R        12/10/21 0855   12/10/21 0856  Strep pneumoniae urinary antigen  Once,   R        12/10/21 0855   12/09/21 2304  Hemoglobin A1c  Once,   R       Comments: To assess prior glycemic control    12/09/21 2303          Data Reviewed: I have personally reviewed following labs and imaging studies CBC: Recent Labs  Lab 12/09/21 1425 12/10/21 0535  WBC 13.3* 9.3  NEUTROABS 11.2*  --   HGB 14.3 11.7*  HCT 43.2 36.6*  MCV 83.1 86.5  PLT 322 [id number]   Basic Metabolic Panel: Recent Labs  Lab 12/09/21 1425 12/10/21 0535  NA 132* 134*  K 4.1 4.1  CL 98 104  CO2 26 25  GLUCOSE 267* 189*  BUN 22* 24*  CREATININE 1.12 1.11  CALCIUM 9.1 8.3*   GFR: Estimated Creatinine Clearance: 84.3 mL/min (by C-G formula based on SCr of 1.11 mg/dL). Liver Function Tests: Recent Labs  Lab 12/09/21 1425  AST 15  ALT 15  ALKPHOS 107  BILITOT 0.7  PROT 7.9  ALBUMIN 3.7   No results for input(s): LIPASE, AMYLASE in  the last 168 hours. No results for input(s): AMMONIA in the last 168 hours. Coagulation Profile: Recent Labs  Lab 12/09/21 1425  INR 1.0   Cardiac Enzymes: No results for input(s): CKTOTAL, CKMB, CKMBINDEX, TROPONINI in the last 168 hours. BNP (last 3 results) No results for input(s): PROBNP in the last 8760 hours. HbA1C: No results for input(s): HGBA1C in the last 72 hours. CBG: Recent Labs  Lab 12/10/21 0738  GLUCAP 154*   Lipid Profile: No results for input(s): CHOL, HDL, LDLCALC, TRIG, CHOLHDL, LDLDIRECT in the last 72 hours. Thyroid Function Tests: No results for input(s): TSH, T4TOTAL, FREET4, T3FREE, THYROIDAB  in the last 72 hours. Anemia Panel: No results for input(s): VITAMINB12, FOLATE, FERRITIN, TIBC, IRON, RETICCTPCT in the last 72 hours. Sepsis Labs: Recent Labs  Lab 12/09/21 1425 12/09/21 1636  LATICACIDVEN 1.6 1.2    Recent Results (from the past 240 hour(s))  Blood Culture (routine x 2)     Status: None (Preliminary result)   Collection Time: 12/09/21  2:24 PM   Specimen: BLOOD  Result Value Ref Range Status   Specimen Description   Final    BLOOD SITE NOT SPECIFIED Performed at [facility] [address], [place], [place] [address]    Special Requests   Final    BOTTLES DRAWN AEROBIC AND ANAEROBIC Blood Culture adequate volume Performed at [facility] [address] [place], [place] [address]    Culture   Final    NO GROWTH < 24 HOURS Performed at [facility] [address], [place], [place] [address]    Report Status PENDING  Incomplete  Resp Panel by RT-PCR (Flu A&B, Covid) Nasopharyngeal Swab     Status: None   Collection Time: 12/09/21  2:25 PM   Specimen: Nasopharyngeal Swab; Nasopharyngeal(NP) swabs in vial transport medium  Result Value Ref Range Status   SARS Coronavirus 2 by RT PCR NEGATIVE NEGATIVE Final    Comment: (NOTE) SARS-CoV-2 target nucleic acids are NOT DETECTED.  The  SARS-CoV-2 RNA is generally detectable in upper respiratory specimens during the acute phase of infection. The lowest concentration of SARS-CoV-2 viral copies this assay can detect is 138 copies/mL. A negative result does not preclude SARS-Cov-2 infection and should not be used as the sole basis for treatment or other patient management decisions. A negative result may occur with  improper specimen collection/handling, submission of specimen other than nasopharyngeal swab, presence of viral mutation(s) within the areas targeted by this assay, and inadequate number of viral copies(<138 copies/mL). A negative result must be combined with clinical observations, patient history, and epidemiological information. The expected result is Negative.  Fact Sheet for Patients:  EntrepreneurPulse.com.au  Fact Sheet for Healthcare Providers:  IncredibleEmployment.be  This test is no t yet approved or cleared by the [place] FDA and  has been authorized for detection and/or diagnosis of SARS-CoV-2 by FDA under an Emergency Use Authorization (EUA). This EUA will remain  in effect (meaning this test can be used) for the duration of the COVID-19 declaration under Section 564(b)(1) of the Act, 21 U.S.C.section 360bbb-3(b)(1), unless the authorization is terminated  or revoked sooner.       Influenza A by PCR NEGATIVE NEGATIVE Final   Influenza B by PCR NEGATIVE NEGATIVE Final    Comment: (NOTE) The Xpert Xpress SARS-CoV-2/FLU/RSV plus assay is intended as an aid in the diagnosis of influenza from Nasopharyngeal swab specimens and should not be used as a sole basis for treatment. Nasal washings and aspirates are unacceptable for Xpert Xpress SARS-CoV-2/FLU/RSV testing.  Fact Sheet for Patients: EntrepreneurPulse.com.au  Fact Sheet for Healthcare Providers: IncredibleEmployment.be  This test is not yet approved or  cleared by the [place] FDA and has been authorized for detection and/or diagnosis of SARS-CoV-2 by FDA under an Emergency Use Authorization (EUA). This EUA will remain in effect (meaning this test can be used) for the duration of the COVID-19 declaration under Section 564(b)(1) of the Act, 21 U.S.C. section 360bbb-3(b)(1), unless the authorization is terminated or revoked.  Performed at [facility], [place] [address], [place], [place] [address]  Blood Culture (routine x 2)     Status: None (Preliminary result)   Collection Time: 12/09/21  4:00 PM   Specimen: BLOOD RIGHT HAND  Result Value Ref Range Status   Specimen Description   Final    BLOOD RIGHT HAND Performed at [facility] [address], [place], [place] [address]    Special Requests   Final    BOTTLES DRAWN AEROBIC AND ANAEROBIC Blood Culture adequate volume Performed at [facility] [address], [place], [place] [address]    Culture   Final    NO GROWTH < 12 HOURS Performed at [facility] [address], [place], [place] [address]    Report Status PENDING  Incomplete    Antimicrobials: Anti-infectives (From admission, onward)    Start     Dose/Rate Route Frequency Ordered Stop   12/10/21 2200  cefTRIAXone (ROCEPHIN) 2 g in sodium chloride 0.9 % 100 mL IVPB        2 g 200 mL/hr over 30 Minutes Intravenous Every 24 hours 12/09/21 2303 12/15/21 2159   12/10/21 1000  azithromycin (ZITHROMAX) tablet 500 mg        500 mg Oral Daily 12/09/21 2303 12/15/21 0959   12/09/21 2000  cefTRIAXone (ROCEPHIN) 1 g in sodium chloride 0.9 % 100 mL IVPB        1 g 200 mL/hr over 30 Minutes Intravenous  Once 12/09/21 1949 12/09/21 2035   12/09/21 2000  azithromycin (ZITHROMAX) tablet 500 mg        500 mg Oral  Once 12/09/21 1949 12/09/21 2004      Culture/Microbiology    Component Value Date/Time   SDES  12/09/2021 1600    BLOOD RIGHT HAND Performed at  [facility], [place] [address] [place], [place] [address]    SPECREQUEST  12/09/2021 1600    BOTTLES DRAWN AEROBIC AND ANAEROBIC Blood Culture adequate volume Performed at [facility] [address] [place], [place] [address]    CULT  12/09/2021 1600    NO GROWTH < 12 HOURS Performed at [facility] [address], [place], [place] [address]    REPTSTATUS PENDING 12/09/2021 1600    Other culture-see note  Radiology Studies: CT Angio Chest PE W and/or Wo Contrast  Result Date: 12/09/2021 CLINICAL DATA:  Fever, body aches, wheezing, hyperglycemia, question pulmonary embolism EXAM: CT ANGIOGRAPHY CHEST WITH CONTRAST TECHNIQUE: Multidetector CT imaging of the chest was performed using the standard protocol during bolus administration of intravenous contrast. Multiplanar CT image reconstructions and MIPs were obtained to evaluate the vascular anatomy. RADIATION DOSE REDUCTION: This exam was performed according to the departmental dose-optimization program which includes automated exposure control, adjustment of the mA and/or kV according to patient size and/or use of iterative reconstruction technique. CONTRAST:  67mL OMNIPAQUE IOHEXOL 350 MG/ML SOLN IV COMPARISON:  None FINDINGS: Cardiovascular: Minimal atherosclerotic calcification aorta and coronary arteries. Aorta normal caliber without aneurysm or dissection. Heart unremarkable. No pericardial effusion. Pulmonary arteries suboptimally opacified with respiratory motion artifacts at the lower lobes. No large or central pulmonary emboli are identified. Unable to exclude small peripheral emboli particularly in the lower lobes. Mediastinum/Nodes: Base of cervical region normal appearance. Esophagus unremarkable. No thoracic adenopathy. Lungs/Pleura: Central peribronchial thickening. Minimal patchy areas of infiltrate RIGHT lung. No pleural effusion or pneumothorax. Upper Abdomen: Upper abdomen unremarkable  Musculoskeletal: No acute osseous findings. Review of the MIP images confirms the above findings. IMPRESSION: No  evidence of large or central pulmonary emboli, with exam limitations of exam due to respiratory motion and suboptimal pulmonary arterial opacification. Central peribronchial thickening with minimal patchy areas of infiltrate in RIGHT lung question pneumonia. Electronically Signed   By: Lavonia Dana M.D.   On: 12/09/2021 19:32   DG Chest Port 1 View  Result Date: 12/09/2021 CLINICAL DATA:  Questionable sepsis - evaluate for abnormality EXAM: PORTABLE CHEST 1 VIEW COMPARISON:  10/03/2021 FINDINGS: The heart size and mediastinal contours are within normal limits and stable. Both lungs are clear. The visualized skeletal structures are unremarkable. IMPRESSION: No active disease. Electronically Signed   By: Davina Poke D.O.   On: 12/09/2021 14:48     LOS: 0 days   Antonieta Pert, MD [facility]  12/10/2021, 11:29 AM

## 2021-12-10 NOTE — Hospital Course (Signed)
61 year old male with DMT2 with neuropathy, HTN, hx of CVA, hx of osteomyelitis s/p BKA left leg who presents with 2-day history of cough, also having body aches with cough subjective fever, scratchy throat and shortness of breath with exertion. In the ED tachycardic, and mildly tachypneic otherwise stable blood pressure and oxygen saturation.  Labs showed leukocytosis 13.3, normal lactic acid COVID-19 influenza negative UA unremarkable, chest x-ray no active disease, CT angio chest no large or central PE, central peribronchial thickening with minimal patchy areas of infiltrate in right lung question pneumonia. Patient was placed on antibiotics and admitted for further management.

## 2021-12-11 DIAGNOSIS — J189 Pneumonia, unspecified organism: Secondary | ICD-10-CM | POA: Diagnosis not present

## 2021-12-11 LAB — RESPIRATORY PANEL BY PCR

## 2021-12-11 LAB — GLUCOSE, CAPILLARY
Glucose-Capillary: 198 mg/dL — ABNORMAL HIGH (ref 70–99)
Glucose-Capillary: 150 mg/dL — ABNORMAL HIGH (ref 70–99)
Glucose-Capillary: 198 mg/dL — ABNORMAL HIGH (ref 70–99)
Glucose-Capillary: 120 mg/dL — ABNORMAL HIGH (ref 70–99)

## 2021-12-11 LAB — HEMOGLOBIN A1C
Hgb A1c MFr Bld: 11.3 % — ABNORMAL HIGH (ref 4.8–5.6)
Mean Plasma Glucose: 278 mg/dL

## 2021-12-11 MED ORDER — HYDROCODONE BIT-HOMATROP MBR 5-1.5 MG/5ML PO SOLN
5.0000 mL | ORAL | Status: DC | PRN
  Administered 2021-12-11 – 2021-12-15 (×16): 5 mL via ORAL
  Filled 2021-12-11 (×16): qty 5

## 2021-12-11 MED ORDER — MELATONIN 5 MG PO TABS
5.0000 mg | ORAL_TABLET | Freq: Every day | ORAL | Status: AC
  Administered 2021-12-11 – 2021-12-12 (×2): 5 mg via ORAL
  Filled 2021-12-11 (×2): qty 1

## 2021-12-11 NOTE — Plan of Care (Signed)

## 2021-12-11 NOTE — Progress Notes (Signed)
PROGRESS NOTE Brett White  GHW:[id number] DOB: 1961-05-17 DOA: 12/09/2021 PCP: Loura Back, NP   Brief Narrative/Hospital Course: 61 year old male with DMT2 with neuropathy, HTN, hx of CVA, hx of osteomyelitis s/p BKA left leg who presents with 2-day history of cough, also having body aches with cough subjective fever, scratchy throat and shortness of breath with exertion. In the ED tachycardic, and mildly tachypneic otherwise stable blood pressure and oxygen saturation.  Labs showed leukocytosis 13.3, normal lactic acid COVID-19 influenza negative UA unremarkable, chest x-ray no active disease, CT angio chest no large or central PE, central peribronchial thickening with minimal patchy areas of infiltrate in right lung question pneumonia. Patient was placed on antibiotics and admitted for further management.    Subjective: Seen this morning.   "I was coughing all night and could not sleep " Does not feel ready for home  Reports he has difficulty breathing with bouts of cough  Assessment and Plan: * CAP (community acquired pneumonia) Intractable cough with sore throat Wheezing Bronchitis: CT angio shows central peribronchial thickening with minimal patchy areas of infiltrate in the right lung suspected community-acquired pneumonia.Leukocytosis improved afebrile.  Patient reports she was "coughing all night and could not sleep".  Does not feel ready for discharge yet, added Hycodan, continue Mucinex/antitussives, needing Percocet for sore throat. strep ag neg- pending Legionella antigen. cont rocephin/azithro, OOB I-S, loratidine, low-dose prednisone, cepacol, Flonase, and Chloraseptic spray.  Type 2 diabetes mellitus with hyperglycemia (HCC) Uncontrolled hyperglycemia with HbA1c 11.3.  On Ozempic and metformin which will be held , continue on SSI for now Recent Labs  Lab 12/10/21 0535 12/10/21 0738 12/10/21 1124 12/10/21 1419 12/10/21 1541 12/10/21 2052 12/11/21 0738  GLUCAP  --     < > 198* 203* 233* 203* 150*  HGBA1C 11.3*  --   --   --   --   --   --    < > = values in this interval not displayed.    Essential hypertension Fairly controlled continue patient's amlodipine metoprolol.  Will defer from initiating new antihypertensive agents (ARBs) in the setting of acute illness and will need PCP follow-up.  Tachycardia In the setting of acute illness pneumonia, heart rate improved.  Patient is on metoprolol home dose.    Chronic hyponatremia Mild hyponatremia and stable.    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  Supportive care.    DVT prophylaxis: enoxaparin (LOVENOX) injection 40 mg Start: 12/10/21 1000 Code Status:   Code Status: Full Code Family Communication: plan of care discussed with patient at bedside.  Disposition: Currently not medically stable for discharge. Status is: Observation Remains hospitalized for ongoing management of pneumonia intractable cough shortness of breath cough.  Objective: Vitals last 24 hrs: Vitals:   12/10/21 1103 12/10/21 1321 12/10/21 2050 12/11/21 0521  BP: (!) 156/99 (!) 143/100 (!) 164/109 (!) 153/97  Pulse: 84 80 78 73  Resp: 18 19 18 18   Temp: 98 F (36.7 C) 97.6 F (36.4 C) (!) 97.3 F (36.3 C) (!) 97.5 F (36.4 C)  TempSrc: Oral Oral Oral Oral  SpO2: 94% 99% 97% 95%  Weight:      Height:       Weight change:   Physical Examination: General exam: AAOX3,older than stated age, weak appearing. HEENT:Oral mucosa moist, Ear/Nose WNL grossly, dentition normal. Respiratory system: bilaterally diminished bs, no use of accessory muscle Cardiovascular system: S1 & S2 +, No JVD,. Gastrointestinal system: Abdomen soft,NT,ND,BS+ Nervous System:Alert, awake, moving  extremities and grossly nonfocal Extremities: LE ankle edema none, distal peripheral pulses palpable.  Skin: No rashes,no icterus. MSK: Normal muscle bulk,tone, power . lt BKA.  Medications reviewed:  Scheduled Meds:   amLODipine  5 mg Oral Daily   aspirin  81 mg Oral Daily   atorvastatin  20 mg Oral QHS   azithromycin  500 mg Oral Daily   DULoxetine  30 mg Oral BID   enoxaparin (LOVENOX) injection  40 mg Subcutaneous Q24H   fentaNYL (SUBLIMAZE) injection  50 mcg Intravenous Once   fluticasone  1 spray Each Nare Daily   gabapentin  400 mg Oral TID   insulin aspart  0-15 Units Subcutaneous TID WC & HS   loratadine  10 mg Oral Daily   metoprolol tartrate  12.5 mg Oral BID   predniSONE  10 mg Oral Q breakfast   Continuous Infusions:  cefTRIAXone (ROCEPHIN)  IV 2 g (12/10/21 2102)   lactated ringers 100 mL/hr at 12/11/21 1059      Diet Order             Diet heart healthy/carb modified Room service appropriate? Yes; Fluid consistency: Thin  Diet effective now                            Intake/Output Summary (Last 24 hours) at 12/11/2021 1122 Last data filed at 12/11/2021 1034 Gross per 24 hour  Intake 1876 ml  Output 1600 ml  Net 276 ml    Net IO Since Admission: 1,096 mL [12/11/21 1122]  Wt Readings from Last 3 Encounters:  12/09/21 97.5 kg  10/25/21 95.3 kg  10/06/21 95.3 kg     Unresulted Labs (From admission, onward)     Start     Ordered   12/10/21 0856  Legionella Pneumophila Serogp 1 Ur Ag  Once,   R        12/10/21 0855          Data Reviewed: I have personally reviewed following labs and imaging studies CBC: Recent Labs  Lab 12/09/21 1425 12/10/21 0535  WBC 13.3* 9.3  NEUTROABS 11.2*  --   HGB 14.3 11.7*  HCT 43.2 36.6*  MCV 83.1 86.5  PLT 322 267    Basic Metabolic Panel: Recent Labs  Lab 12/09/21 1425 12/10/21 0535  NA 132* 134*  K 4.1 4.1  CL 98 104  CO2 26 25  GLUCOSE 267* 189*  BUN 22* 24*  CREATININE 1.12 1.11  CALCIUM 9.1 8.3*    GFR: Estimated Creatinine Clearance: 84.3 mL/min (by C-G formula based on SCr of 1.11 mg/dL). Liver Function Tests: Recent Labs  Lab 12/09/21 1425  AST 15  ALT 15  ALKPHOS 107  BILITOT 0.7  PROT  7.9  ALBUMIN 3.7    No results for input(s): LIPASE, AMYLASE in the last 168 hours. No results for input(s): AMMONIA in the last 168 hours. Coagulation Profile: Recent Labs  Lab 12/09/21 1425  INR 1.0    Cardiac Enzymes: No results for input(s): CKTOTAL, CKMB, CKMBINDEX, TROPONINI in the last 168 hours. BNP (last 3 results) No results for input(s): PROBNP in the last 8760 hours. HbA1C: Recent Labs    12/10/21 0535  HGBA1C 11.3*   CBG: Recent Labs  Lab 12/10/21 1124 12/10/21 1419 12/10/21 1541 12/10/21 2052 12/11/21 0738  GLUCAP 198* 203* 233* 203* 150*    Lipid Profile: No results for input(s): CHOL, HDL, LDLCALC, TRIG, CHOLHDL, LDLDIRECT in the  last 72 hours. Thyroid Function Tests: No results for input(s): TSH, T4TOTAL, FREET4, T3FREE, THYROIDAB in the last 72 hours. Anemia Panel: No results for input(s): VITAMINB12, FOLATE, FERRITIN, TIBC, IRON, RETICCTPCT in the last 72 hours. Sepsis Labs: Recent Labs  Lab 12/09/21 1425 12/09/21 1636  LATICACIDVEN 1.6 1.2     Recent Results (from the past 240 hour(s))  Blood Culture (routine x 2)     Status: None (Preliminary result)   Collection Time: 12/09/21  2:24 PM   Specimen: BLOOD  Result Value Ref Range Status   Specimen Description   Final    BLOOD SITE NOT SPECIFIED Performed at [facility], [address], [place], [place] [address]    Special Requests   Final    BOTTLES DRAWN AEROBIC AND ANAEROBIC Blood Culture adequate volume Performed at [facility], [address], [place], [place] [address]    Culture   Final    NO GROWTH 2 DAYS Performed at [facility], [address] [place], [place] [address]    Report Status PENDING  Incomplete  Resp Panel by RT-PCR (Flu A&B, Covid) Nasopharyngeal Swab     Status: None   Collection Time: 12/09/21  2:25 PM   Specimen: Nasopharyngeal Swab; Nasopharyngeal(NP) swabs in vial transport medium  Result Value Ref Range  Status   SARS Coronavirus 2 by RT PCR NEGATIVE NEGATIVE Final    Comment: (NOTE) SARS-CoV-2 target nucleic acids are NOT DETECTED.  The SARS-CoV-2 RNA is generally detectable in upper respiratory specimens during the acute phase of infection. The lowest concentration of SARS-CoV-2 viral copies this assay can detect is 138 copies/mL. A negative result does not preclude SARS-Cov-2 infection and should not be used as the sole basis for treatment or other patient management decisions. A negative result may occur with  improper specimen collection/handling, submission of specimen other than nasopharyngeal swab, presence of viral mutation(s) within the areas targeted by this assay, and inadequate number of viral copies(<138 copies/mL). A negative result must be combined with clinical observations, patient history, and epidemiological information. The expected result is Negative.  Fact Sheet for Patients:  BloggerCourse.com  Fact Sheet for Healthcare Providers:  SeriousBroker.it  This test is no t yet approved or cleared by the [place] FDA and  has been authorized for detection and/or diagnosis of SARS-CoV-2 by FDA under an Emergency Use Authorization (EUA). This EUA will remain  in effect (meaning this test can be used) for the duration of the COVID-19 declaration under Section 564(b)(1) of the Act, 21 U.S.C.section 360bbb-3(b)(1), unless the authorization is terminated  or revoked sooner.       Influenza A by PCR NEGATIVE NEGATIVE Final   Influenza B by PCR NEGATIVE NEGATIVE Final    Comment: (NOTE) The Xpert Xpress SARS-CoV-2/FLU/RSV plus assay is intended as an aid in the diagnosis of influenza from Nasopharyngeal swab specimens and should not be used as a sole basis for treatment. Nasal washings and aspirates are unacceptable for Xpert Xpress SARS-CoV-2/FLU/RSV testing.  Fact Sheet for  Patients: BloggerCourse.com  Fact Sheet for Healthcare Providers: SeriousBroker.it  This test is not yet approved or cleared by the [place] FDA and has been authorized for detection and/or diagnosis of SARS-CoV-2 by FDA under an Emergency Use Authorization (EUA). This EUA will remain in effect (meaning this test can be used) for the duration of the COVID-19 declaration under Section 564(b)(1) of the Act, 21 U.S.C. section 360bbb-3(b)(1), unless the authorization is terminated or  revoked.  Performed at [facility], [address], [place], [place] [address]   Blood Culture (routine x 2)     Status: None (Preliminary result)   Collection Time: 12/09/21  4:00 PM   Specimen: BLOOD RIGHT HAND  Result Value Ref Range Status   Specimen Description   Final    BLOOD RIGHT HAND Performed at [facility], [address] [place], [place] [address]    Special Requests   Final    BOTTLES DRAWN AEROBIC AND ANAEROBIC Blood Culture adequate volume Performed at [facility], [address], [place], [place] [address]    Culture   Final    NO GROWTH 2 DAYS Performed at [facility], [address] [place], [place] [address]    Report Status PENDING  Incomplete  Respiratory (~20 pathogens) panel by PCR     Status: None   Collection Time: 12/10/21  3:43 AM   Specimen: Nasopharyngeal Swab; Respiratory  Result Value Ref Range Status   Adenovirus NOT DETECTED NOT DETECTED Final   Coronavirus 229E NOT DETECTED NOT DETECTED Final    Comment: (NOTE) The Coronavirus on the Respiratory Panel, DOES NOT test for the novel  Coronavirus (2019 nCoV)    Coronavirus HKU1 NOT DETECTED NOT DETECTED Final   Coronavirus NL63 NOT DETECTED NOT DETECTED Final   Coronavirus OC43 NOT DETECTED NOT DETECTED Final   Metapneumovirus NOT DETECTED NOT DETECTED Final   Rhinovirus / Enterovirus NOT  DETECTED NOT DETECTED Final   Influenza A NOT DETECTED NOT DETECTED Final   Influenza B NOT DETECTED NOT DETECTED Final   Parainfluenza Virus 1 NOT DETECTED NOT DETECTED Final   Parainfluenza Virus 2 NOT DETECTED NOT DETECTED Final   Parainfluenza Virus 3 NOT DETECTED NOT DETECTED Final   Parainfluenza Virus 4 NOT DETECTED NOT DETECTED Final   Respiratory Syncytial Virus NOT DETECTED NOT DETECTED Final   Bordetella pertussis NOT DETECTED NOT DETECTED Final   Bordetella Parapertussis NOT DETECTED NOT DETECTED Final   Chlamydophila pneumoniae NOT DETECTED NOT DETECTED Final   Mycoplasma pneumoniae NOT DETECTED NOT DETECTED Final    Comment: Performed at [facility], [address] [place] [address], [place], [place] [address]     Antimicrobials: Anti-infectives (From admission, onward)    Start     Dose/Rate Route Frequency Ordered Stop   12/10/21 2200  cefTRIAXone (ROCEPHIN) 2 g in sodium chloride 0.9 % 100 mL IVPB        2 g 200 mL/hr over 30 Minutes Intravenous Every 24 hours 12/09/21 2303 12/15/21 2159   12/10/21 1000  azithromycin (ZITHROMAX) tablet 500 mg        500 mg Oral Daily 12/09/21 2303 12/15/21 0959   12/09/21 2000  cefTRIAXone (ROCEPHIN) 1 g in sodium chloride 0.9 % 100 mL IVPB        1 g 200 mL/hr over 30 Minutes Intravenous  Once 12/09/21 1949 12/09/21 2035   12/09/21 2000  azithromycin (ZITHROMAX) tablet 500 mg        500 mg Oral  Once 12/09/21 1949 12/09/21 2004      Culture/Microbiology    Component Value Date/Time   SDES  12/09/2021 1600    BLOOD RIGHT HAND Performed at [facility], [address] [place], [place] [address]    SPECREQUEST  12/09/2021 1600    BOTTLES DRAWN AEROBIC AND ANAEROBIC Blood Culture adequate volume Performed at [facility], [address] [place],  [place] [address]    CULT  12/09/2021 1600    NO GROWTH 2 DAYS Performed at [facility], [address] [place], [place] [address]     REPTSTATUS PENDING 12/09/2021 1600    Other culture-see note  Radiology Studies: CT Angio Chest PE W and/or Wo Contrast  Result Date: 12/09/2021 CLINICAL DATA:  Fever, body aches, wheezing, hyperglycemia, question pulmonary embolism EXAM: CT ANGIOGRAPHY CHEST WITH CONTRAST TECHNIQUE: Multidetector CT imaging of the chest was performed using the standard protocol during bolus administration of intravenous contrast. Multiplanar CT image reconstructions and MIPs were obtained to evaluate the vascular anatomy. RADIATION DOSE REDUCTION: This exam was performed according to the departmental dose-optimization program which includes automated exposure control, adjustment of the mA and/or kV according to patient size and/or use of iterative reconstruction technique. CONTRAST:  75mL OMNIPAQUE IOHEXOL 350 MG/ML SOLN IV COMPARISON:  None FINDINGS: Cardiovascular: Minimal atherosclerotic calcification aorta and coronary arteries. Aorta normal caliber without aneurysm or dissection. Heart unremarkable. No pericardial effusion. Pulmonary arteries suboptimally opacified with respiratory motion artifacts at the lower lobes. No large or central pulmonary emboli are identified. Unable to exclude small peripheral emboli particularly in the lower lobes. Mediastinum/Nodes: Base of cervical region normal appearance. Esophagus unremarkable. No thoracic adenopathy. Lungs/Pleura: Central peribronchial thickening. Minimal patchy areas of infiltrate RIGHT lung. No pleural effusion or pneumothorax. Upper Abdomen: Upper abdomen unremarkable Musculoskeletal: No acute osseous findings. Review of the MIP images confirms the above findings. IMPRESSION: No evidence of large or central pulmonary emboli, with exam limitations of exam due to respiratory motion and suboptimal pulmonary arterial opacification. Central peribronchial thickening with minimal patchy areas of infiltrate in RIGHT lung question pneumonia. Electronically Signed   By: Ulyses Southward M.D.   On: 12/09/2021 19:32   DG Chest Port 1 View  Result Date: 12/09/2021 CLINICAL DATA:  Questionable sepsis - evaluate for abnormality EXAM: PORTABLE CHEST 1 VIEW COMPARISON:  10/03/2021 FINDINGS: The heart size and mediastinal contours are within normal limits and stable. Both lungs are clear. The visualized skeletal structures are unremarkable. IMPRESSION: No active disease. Electronically Signed   By: Duanne Guess D.O.   On: 12/09/2021 14:48     LOS: 0 days   Lanae Boast, MD [facility]  12/11/2021, 11:22 AM

## 2021-12-11 NOTE — Progress Notes (Signed)
Mobility Specialist - Progress Note ? ? ? 12/11/21 1634  ?Mobility  ?Activity Ambulated with assistance in hallway;Ambulated with assistance in room  ?Level of Assistance Contact guard assist, steadying assist  ?Assistive Device Front wheel walker  ?Distance Ambulated (ft) 450 ft  ?Activity Response Tolerated well  ?$Mobility charge 1 Mobility  ? ?Pt agreeable to mobilize upon entry. He is able to put on own prosthetic. Stated he is IND, but was encouraged to use RW due to unsteadiness. Pt ambulated ~450 ft in hallway and c/o of chest pain secondary to persistent cough. Pt returned to room at [facility] and left sitting up on couch. All necessities within reach.  ? ?Nava Song S. ?Mobility Specialist ?Acute Rehabilitation Services ?Phone: [phone] ?12/11/21, 4:38 PM ? ? ? ? ?

## 2021-12-11 NOTE — Progress Notes (Signed)
Inpatient Diabetes Program Recommendations ? ?AACE/ADA: New Consensus Statement on Inpatient Glycemic Control (2015) ? ?Target Ranges:  Prepandial:   less than 140 mg/dL ?     Peak postprandial:   less than 180 mg/dL (1-2 hours) ?     Critically ill patients:  140 - 180 mg/dL  ? ? Latest Reference Range & Units 12/10/21 07:38 12/10/21 11:24 12/10/21 14:19 12/10/21 15:41 12/10/21 20:52  ?Glucose-Capillary 70 - 99 mg/dL 154 (H) 198 (H) 203 (H) 233 (H) 203 (H)  ? ? Latest Reference Range & Units 12/11/21 07:38 12/11/21 11:31  ?Glucose-Capillary 70 - 99 mg/dL 150 (H) 198 (H)  ? ? ? Latest Reference Range & Units 02/03/21 02:17 10/04/21 02:18 12/10/21 05:35  ?Hemoglobin A1C 4.8 - 5.6 % 11.5 (H) 12.2 (H) 11.3 (H) ? ?(278 mg/dl)  ?(H): Data is abnormally high ? ? ?Admit with: CAP (community acquired pneumonia) ? ?History: DM2, CVA ? ?Home DM Meds: Amaryl 8 mg daily ?      Novolog 70/30 Insulin 16 units BID (NOT taking) ?      Metformin 1000 mg BID ?      Mounjaro 5 mg Qweek ? ?Current Orders: Novolog Moderate Correction Scale/ SSI (0-15 units) TID AC + HS ? ? ? ?PCP: Arthur Holms, NP ? ?A1c was 11.5% in May 2022 when pt admitted with L Foot osteomyelitis--Was [facility] with Rxs for Lantus 25 units daily + Novolog 5 units TID ? ?Admit again 03/03/2021 with Foot Abscess--Discharged with same Lantus and Novolog Rxs on 03/15/2021 ? ?Back to ED 03/17/2021--underwent amputation and d/c back to SNF on 03/29/2021 with same Rxs for Lantus and Novolog ? ?A1c was 12.2% when pt admitted in January 2023 ?Patient counseled by the Diabetes RN regarding his diabetes on 10/05/2021--Was not able to afford Lantus and Novolog and was converted to 70/30 Insulin 16 units BID when he was discharged home 10/19/2021 ? ? ? ?Addendum 11:30am--Met w/ pt at bedside this AM.  Pt stated he takes one injection per week (could not remember the name of the med).  When I stated Mounjaro, he said "yes, that's the name"--also taking Metformin +  Amaryl at home.  Pt told me he never took Lantus and Novolog when he went home b/c it was too expensive--Apparently received the 2 insulins in SNF for Rehab but never took these 2 insulins when he was sent home form Rehab.  When he was here in January of this year, he stated that he never switched insulin to 70/30 insulin BID.  Has no transportation and told me he could not get to [employer] to buy the 70/30 Insulin.  Told me his PCP Arthur Holms, NP prescribes the oral diabetes meds and the [facility] injection. ? ?Spoke with patient about his current A1c of 11.3%.  Explained what an A1c is and what it measures.  Reminded patient that his goal A1c is 7% or less per ADA standards to prevent both acute and long-term complications.  Also discussed with pt his last 2 a1c levels and how they were uncontrolled as well.  Explained to patient the extreme importance of good glucose control at home (especially for preventing further infections and to also help with healing up from his PNA).  Encouraged patient to check his CBGs at least bid at home (fasting and another check within the day) and to record all CBGs in a logbook for his PCP to review.  Pt stated he does not have CBG meter at home.  I asked him why he did not ask his PCP for a CBG meter Rx and pt told me he did, they sent Rx in to the [facility], but he did not have transportation to pick up the Rx.  Will ask Attending to provide Rx and perhaps we can fill this Rx before he goes home. ? ? ? ?--Will follow patient during hospitalization-- ? ?Wyn Quaker RN, MSN, CDE ?Diabetes Coordinator ?Inpatient Glycemic Control Team ?Team Pager: [phone] (8a-5p) ? ?

## 2021-12-11 NOTE — Care Management Obs Status (Signed)
MEDICARE OBSERVATION STATUS NOTIFICATION ? ? ?Patient Details  ?Name: Brett White ?MRN: [id number] ?Date of Birth: 03-Sep-1961 ? ? ?Medicare Observation Status Notification Given:  Yes ? ? ? ?Adrin Julian Aris Lot, LCSW ?12/11/2021, 1:52 PM ?

## 2021-12-12 DIAGNOSIS — E1165 Type 2 diabetes mellitus with hyperglycemia: Secondary | ICD-10-CM | POA: Diagnosis present

## 2021-12-12 DIAGNOSIS — Z20822 Contact with and (suspected) exposure to covid-19: Secondary | ICD-10-CM | POA: Diagnosis present

## 2021-12-12 DIAGNOSIS — E871 Hypo-osmolality and hyponatremia: Secondary | ICD-10-CM | POA: Diagnosis present

## 2021-12-12 DIAGNOSIS — J45909 Unspecified asthma, uncomplicated: Secondary | ICD-10-CM | POA: Diagnosis present

## 2021-12-12 DIAGNOSIS — Z7984 Long term (current) use of oral hypoglycemic drugs: Secondary | ICD-10-CM | POA: Diagnosis not present

## 2021-12-12 DIAGNOSIS — I10 Essential (primary) hypertension: Secondary | ICD-10-CM | POA: Diagnosis present

## 2021-12-12 DIAGNOSIS — R052 Subacute cough: Secondary | ICD-10-CM | POA: Diagnosis not present

## 2021-12-12 DIAGNOSIS — Z7982 Long term (current) use of aspirin: Secondary | ICD-10-CM | POA: Diagnosis not present

## 2021-12-12 DIAGNOSIS — K219 Gastro-esophageal reflux disease without esophagitis: Secondary | ICD-10-CM | POA: Diagnosis present

## 2021-12-12 DIAGNOSIS — Z794 Long term (current) use of insulin: Secondary | ICD-10-CM | POA: Diagnosis not present

## 2021-12-12 DIAGNOSIS — Z8673 Personal history of transient ischemic attack (TIA), and cerebral infarction without residual deficits: Secondary | ICD-10-CM | POA: Diagnosis not present

## 2021-12-12 DIAGNOSIS — J189 Pneumonia, unspecified organism: Secondary | ICD-10-CM | POA: Diagnosis present

## 2021-12-12 DIAGNOSIS — J45901 Unspecified asthma with (acute) exacerbation: Secondary | ICD-10-CM | POA: Diagnosis not present

## 2021-12-12 DIAGNOSIS — Z89412 Acquired absence of left great toe: Secondary | ICD-10-CM | POA: Diagnosis not present

## 2021-12-12 DIAGNOSIS — Z79899 Other long term (current) drug therapy: Secondary | ICD-10-CM | POA: Diagnosis not present

## 2021-12-12 DIAGNOSIS — E1140 Type 2 diabetes mellitus with diabetic neuropathy, unspecified: Secondary | ICD-10-CM | POA: Diagnosis present

## 2021-12-12 DIAGNOSIS — Z89512 Acquired absence of left leg below knee: Secondary | ICD-10-CM | POA: Diagnosis not present

## 2021-12-12 LAB — BASIC METABOLIC PANEL
Anion gap: 8 (ref 5–15)
BUN: 25 mg/dL — ABNORMAL HIGH (ref 6–20)
CO2: 30 mmol/L (ref 22–32)
Calcium: 8.8 mg/dL — ABNORMAL LOW (ref 8.9–10.3)
Chloride: 96 mmol/L — ABNORMAL LOW (ref 98–111)
Creatinine, Ser: 0.87 mg/dL (ref 0.61–1.24)
GFR, Estimated: >60 mL/min (ref >60)
Glucose, Bld: 186 mg/dL — ABNORMAL HIGH (ref 70–99)
Potassium: 4.3 mmol/L (ref 3.5–5.1)
Sodium: 134 mmol/L — ABNORMAL LOW (ref 135–145)

## 2021-12-12 LAB — GLUCOSE, CAPILLARY
Glucose-Capillary: 261 mg/dL — ABNORMAL HIGH (ref 70–99)
Glucose-Capillary: 157 mg/dL — ABNORMAL HIGH (ref 70–99)
Glucose-Capillary: 231 mg/dL — ABNORMAL HIGH (ref 70–99)
Glucose-Capillary: 214 mg/dL — ABNORMAL HIGH (ref 70–99)
Glucose-Capillary: 166 mg/dL — ABNORMAL HIGH (ref 70–99)

## 2021-12-12 LAB — LEGIONELLA PNEUMOPHILA SEROGP 1 UR AG: L. pneumophila Serogp 1 Ur Ag: NEGATIVE

## 2021-12-12 LAB — GROUP A STREP BY PCR: Group A Strep by PCR: NOT DETECTED

## 2021-12-12 MED ORDER — PREDNISONE 20 MG PO TABS
20.0000 mg | ORAL_TABLET | Freq: Every day | ORAL | Status: AC
  Administered 2021-12-13 – 2021-12-15 (×3): 20 mg via ORAL
  Filled 2021-12-12 (×3): qty 1

## 2021-12-12 MED ORDER — PANTOPRAZOLE SODIUM 40 MG PO TBEC
40.0000 mg | DELAYED_RELEASE_TABLET | Freq: Every day | ORAL | Status: DC
  Administered 2021-12-12: 40 mg via ORAL
  Filled 2021-12-12: qty 1

## 2021-12-12 MED ORDER — FUROSEMIDE 20 MG PO TABS
20.0000 mg | ORAL_TABLET | Freq: Every morning | ORAL | Status: DC
  Administered 2021-12-12 – 2021-12-15 (×4): 20 mg via ORAL
  Filled 2021-12-12 (×3): qty 1

## 2021-12-12 MED ORDER — PREDNISONE 10 MG PO TABS
10.0000 mg | ORAL_TABLET | Freq: Once | ORAL | Status: AC
  Administered 2021-12-12: 10 mg via ORAL
  Filled 2021-12-12: qty 1

## 2021-12-12 MED ORDER — HYDROCODONE BIT-HOMATROP MBR 5-1.5 MG/5ML PO SOLN
5.0000 mL | Freq: Once | ORAL | Status: AC
  Administered 2021-12-12: 5 mL via ORAL
  Filled 2021-12-12: qty 5

## 2021-12-12 MED ORDER — INSULIN ASPART PROT & ASPART (70-30 MIX) 100 UNIT/ML PEN: 5.0000 [IU] | PEN_INJECTOR | Freq: Two times a day (BID) | SUBCUTANEOUS | Status: DC

## 2021-12-12 MED ORDER — INSULIN ASPART PROT & ASPART (70-30 MIX) 100 UNIT/ML ~~LOC~~ SUSP
5.0000 [IU] | Freq: Two times a day (BID) | SUBCUTANEOUS | Status: DC
  Administered 2021-12-12 – 2021-12-14 (×4): 5 [IU] via SUBCUTANEOUS
  Filled 2021-12-12: qty 10

## 2021-12-12 NOTE — Progress Notes (Signed)
?PROGRESS NOTE ?Brett White  [id number] DOB: 28-Mar-1961 DOA: 12/09/2021 ?PCP: Arthur Holms, NP  ? ?Brief Narrative/Hospital Course: ?61 year old male with DMT2 with neuropathy, HTN, hx of CVA, hx of osteomyelitis s/p BKA left leg who presents with 2-day history of cough, also having body aches with cough subjective fever, scratchy throat and shortness of breath with exertion. ?In the ED tachycardic, and mildly tachypneic otherwise stable blood pressure and oxygen saturation.  Labs showed leukocytosis 13.3, normal lactic acid COVID-19 influenza negative UA unremarkable, chest x-ray no active disease, CT angio chest no large or central PE, central peribronchial thickening with minimal patchy areas of infiltrate in right lung question pneumonia. ?Patient was placed on antibiotics and admitted for further management. ?Initial leukocytosis has resolved with IV antibiotics.  Not needing oxygen however patient continues to be symptomatic with coughing spells, was wheezing 3/14 a.m. improving with bronchodilators.  ?  ?Subjective: ?Seen examined.  Still complains of ongoing coughing spells and wheezing.  Nursing reports he had significant wheezing this morning and received nebulizer treatment wheezing is improving had some mild expiratory wheezing ?Restarted prednisone at 20 mg today ? ?Assessment and Plan: ?* CAP (community acquired pneumonia) ?Intractable cough with sore throat ?Wheezing ?Bronchitis: ?CT angio shows central peribronchial thickening with minimal patchy areas of infiltrate in the right lung suspected community-acquired pneumonia.WBC count improved afebrile but patient continues to have ongoing coughing spells, this morning was wheezing per nursing.  Was placed on 2 mg prednisone completed today, will reinitiate at 20 mg , continue ceftriaxone/azithromycin, give additional dose of Hycodan, continue mucinex/antitussives, flonase, cepacol,chloraseptic spray claritin and pain control for sore throat.  ADD  PPI, Check strep in throat. ? ?Type 2 diabetes mellitus with hyperglycemia ([place]) ?Uncontrolled hyperglycemia with HbA1c 11.3.  Patient was given prescription but he has not picked up from last discharge.  He was given NovoLog 70/30 16 u bid-but he has not started 70/30 at home and never picked up rx as he says he had no transport. ill start at 5 u bid, cont ssi.  He will need insulin prescription on discharge.  ?Recent Labs  ?Lab 12/10/21 ?[id number] 12/10/21 ?[id number] 12/10/21 ?2052 12/11/21 ?[id number] 12/11/21 ?1131 12/11/21 ?2113 12/12/21 ?0756  ?GLUCAP  --    < > 203* 150* 198* 120* 157*  ?HGBA1C 11.3*  --   --   --   --   --   --   ? < > = values in this interval not displayed.  ?  ?Essential hypertension ?Stable cont amlodipine metoprolol. ? ?Tachycardia ?In the setting of acute illness pneumonia, heart rate improved.  Patient is on metoprolol home dose.   ? ?Chronic hyponatremia ?Mild hyponatremia and stable.   ? ?Acquired absence of left leg below knee (HCC) ?Left BKA with prosthesis.  Supportive care.   ? ?DVT prophylaxis: enoxaparin (LOVENOX) injection 40 mg Start: 12/10/21 1000 ?Code Status:   Code Status: Full Code ?Family Communication: plan of care discussed with patient at bedside. ? ?Disposition: Currently not medically stable for discharge. ?Status is: Admitted as observation ?Remains hospitalized for ongoing management of pneumonia intractable cough shortness of breath cough.  ?Objective: ?Vitals last 24 hrs: ?Vitals:  ? 12/11/21 0521 12/11/21 1313 12/11/21 2014 12/12/21 0420  ?BP: (!) 153/97 (!) 141/94 (!) 153/106 (!) 143/99  ?Pulse: 73 79 78 77  ?Resp: 18 18 18 20   ?Temp: (!) 97.5 ?F (36.4 ?C) 97.7 ?F (36.5 ?C) 99.2 ?F (37.3 ?C) (!) 97.5 ?F (36.4 ?C)  ?TempSrc: Oral Oral Oral Oral  ?  SpO2: 95% 95% 96% 96%  ?Weight:      ?Height:      ? ?Weight change:  ? ?Physical Examination: ?General exam: AA0X3,older than stated age, weak appearing. ?HEENT:Oral mucosa moist, Ear/Nose WNL grossly, dentition  normal. ?Respiratory system: bilaterally mild wheezing on expiration, gets intractable cough with deep breath,no use of accessory muscle ?Cardiovascular system: S1 & S2 +, No JVD,. ?Gastrointestinal system: Abdomen soft,NT,ND, BS+ ?Nervous System:Alert, awake, moving extremities and grossly nonfocal ?Extremities: edema neg,distal peripheral pulses palpable. LT bka. ?Skin: No rashes,no icterus. ?MSK: Normal muscle bulk,tone, power ? ? ?Medications reviewed:  ?Scheduled Meds: ? amLODipine  5 mg Oral Daily  ? aspirin  81 mg Oral Daily  ? atorvastatin  20 mg Oral QHS  ? azithromycin  500 mg Oral Daily  ? DULoxetine  30 mg Oral BID  ? enoxaparin (LOVENOX) injection  40 mg Subcutaneous Q24H  ? fentaNYL (SUBLIMAZE) injection  50 mcg Intravenous Once  ? fluticasone  1 spray Each Nare Daily  ? gabapentin  400 mg Oral TID  ? HYDROcodone bit-homatropine  5 mL Oral Once  ? insulin aspart  0-15 Units Subcutaneous TID WC & HS  ? loratadine  10 mg Oral Daily  ? melatonin  5 mg Oral QHS  ? metoprolol tartrate  12.5 mg Oral BID  ? predniSONE  10 mg Oral Once  ? [START ON 12/13/2021] predniSONE  20 mg Oral Q breakfast  ? ?Continuous Infusions: ? cefTRIAXone (ROCEPHIN)  IV 2 g (12/11/21 2118)  ? ? ?  ?Diet Order   ? ?       ?  Diet heart healthy/carb modified Room service appropriate? Yes; Fluid consistency: Thin  Diet effective now       ?  ? ?  ?  ? ?  ?  ? ?  ?  ?  ? ? ?Intake/Output Summary (Last 24 hours) at 12/12/2021 1126 ?Last data filed at 12/12/2021 0600 ?Gross per 24 hour  ?Intake 580 ml  ?Output 1350 ml  ?Net -770 ml  ? ?Net IO Since Admission: 326 mL [12/12/21 1126]  ?Wt Readings from Last 3 Encounters:  ?12/09/21 97.5 kg  ?10/25/21 95.3 kg  ?10/06/21 95.3 kg  ?  ? ?Unresulted Labs (From admission, onward)  ? ?  Start     Ordered  ? 12/10/21 0856  Legionella Pneumophila Serogp 1 Ur Ag  Once,   R       ? 12/10/21 0855  ? ?  ?  ? ?  ?Data Reviewed: I have personally reviewed following labs and imaging studies ?CBC: ?Recent  Labs  ?Lab 12/09/21 ?1425 12/10/21 ?[id number]  ?WBC 13.3* 9.3  ?NEUTROABS 11.2*  --   ?HGB 14.3 11.7*  ?HCT 43.2 36.6*  ?MCV 83.1 86.5  ?PLT 322 267  ? ?Basic Metabolic Panel: ?Recent Labs  ?Lab 12/09/21 ?1425 12/10/21 ?[id number] 12/12/21 ?0432  ?NA 132* 134* 134*  ?K 4.1 4.1 4.3  ?CL 98 104 96*  ?CO2 26 25 30   ?GLUCOSE 267* 189* 186*  ?BUN 22* 24* 25*  ?CREATININE 1.12 1.11 0.87  ?CALCIUM 9.1 8.3* 8.8*  ? ?GFR: ?Estimated Creatinine Clearance: 107.6 mL/min (by C-G formula based on SCr of 0.87 mg/dL). ?Liver Function Tests: ?Recent Labs  ?Lab 12/09/21 ?1425  ?AST 15  ?ALT 15  ?ALKPHOS 107  ?BILITOT 0.7  ?PROT 7.9  ?ALBUMIN 3.7  ? ?No results for input(s): LIPASE, AMYLASE in the last 168 hours. ?No results for input(s): AMMONIA in the last 168  hours. ?Coagulation Profile: ?Recent Labs  ?Lab 12/09/21 ?1425  ?INR 1.0  ? ?Cardiac Enzymes: ?No results for input(s): CKTOTAL, CKMB, CKMBINDEX, TROPONINI in the last 168 hours. ?BNP (last 3 results) ?No results for input(s): PROBNP in the last 8760 hours. ?HbA1C: ?Recent Labs  ?  12/10/21 ?[id number]  ?HGBA1C 11.3*  ? ?CBG: ?Recent Labs  ?Lab 12/10/21 ?2052 12/11/21 ?[id number] 12/11/21 ?1131 12/11/21 ?2113 12/12/21 ?0756  ?GLUCAP 203* 150* 198* 120* 157*  ? ?Lipid Profile: ?No results for input(s): CHOL, HDL, LDLCALC, TRIG, CHOLHDL, LDLDIRECT in the last 72 hours. ?Thyroid Function Tests: ?No results for input(s): TSH, T4TOTAL, FREET4, T3FREE, THYROIDAB in the last 72 hours. ?Anemia Panel: ?No results for input(s): VITAMINB12, FOLATE, FERRITIN, TIBC, IRON, RETICCTPCT in the last 72 hours. ?Sepsis Labs: ?Recent Labs  ?Lab 12/09/21 ?1425 12/09/21 ?1636  ?LATICACIDVEN 1.6 1.2  ? ? ?Recent Results (from the past 240 hour(s))  ?Blood Culture (routine x 2)     Status: None (Preliminary result)  ? Collection Time: 12/09/21  2:24 PM  ? Specimen: BLOOD  ?Result Value Ref Range Status  ? Specimen Description   Final  ?  BLOOD SITE NOT SPECIFIED ?Performed at [facility], [place] [address] [place], [place] [address] ?  ? Special Requests   Final  ?  BOTTLES DRAWN AEROBIC AND ANAEROBIC Blood Culture adequate volume ?Performed at [facility], [place] [address] [place], [place] [address] ?  ?

## 2021-12-12 NOTE — Progress Notes (Signed)
RN finished receiving report on this patient as he was walking back to his room. Pt had been ambulating in the hall at shift change walking from the end of the hall at the window where he had been enjoying the view. Upon returning to his room, pt fell down on his L knee. Pt has a LBKA. Pt assisted back to his room with the aid of a walker and vitals taken. Vitals taken : 157/95, temp 100.2, HR 83, 99% RA. Pt states he 'blacked out' 5 days ago which is why he went to PCP and was sent to hospital for pneumonia. Ice pack applied to L knee. ?Clarene Essex, NP came to see pt. No further tests ordered. Falls bracelet on, bed alarm activated and pt educated on falls safety. Pt is Aox4 but can be forgetful d/t previous CVA. Will continue to monitor.  ?

## 2021-12-12 NOTE — Progress Notes (Signed)
? ? ?  OVERNIGHT PROGRESS REPORT ? ?Notified by RN for patient follow-up in the hallway while ambulating on his own. ?This was an unwitnessed fall. ?Patient requires use of prosthetic due to s/p BKA left leg. ?Patient was found on hallway attempting to pull himself up. ? ?Patient stated, "I think I blacked out". ?He can clearly state that he "landed on his knees". ? ?Nursing staff reports he was awake and oriented on their arrival to him to to assist him up. ? ?Patient has no obvious injuries. ? ?He has movement of extremities x4. ?He has equal strength x4 (BKA left estimate). ?Pupils normal ?A x O x 3 ? ?Patient is not on Blood thinners. ?Patient is on Narcotic PRN (Hycodan)  ? ?Observe with ambulation in the immediate time. ? ? ? ?Chinita Greenland MSNA MSN ACNPC-AG ?Acute Care Nurse Practitioner ?[facility] ?[place] ? ? ? ?

## 2021-12-13 ENCOUNTER — Encounter (HOSPITAL_COMMUNITY): Payer: Self-pay | Admitting: Family Medicine

## 2021-12-13 ENCOUNTER — Inpatient Hospital Stay (HOSPITAL_COMMUNITY): Payer: Medicare HMO

## 2021-12-13 DIAGNOSIS — J189 Pneumonia, unspecified organism: Secondary | ICD-10-CM | POA: Diagnosis not present

## 2021-12-13 DIAGNOSIS — R052 Subacute cough: Secondary | ICD-10-CM | POA: Diagnosis not present

## 2021-12-13 DIAGNOSIS — J45901 Unspecified asthma with (acute) exacerbation: Secondary | ICD-10-CM | POA: Diagnosis not present

## 2021-12-13 LAB — GLUCOSE, CAPILLARY
Glucose-Capillary: 158 mg/dL — ABNORMAL HIGH (ref 70–99)
Glucose-Capillary: 153 mg/dL — ABNORMAL HIGH (ref 70–99)
Glucose-Capillary: 269 mg/dL — ABNORMAL HIGH (ref 70–99)

## 2021-12-13 IMAGING — DX DG CHEST 1V PORT
1 series · 1 of 1 positions shown · non-contrast
Comparison: [DATE]

CLINICAL DATA: Cough

EXAM:
PORTABLE CHEST 1 VIEW

[chest ap]
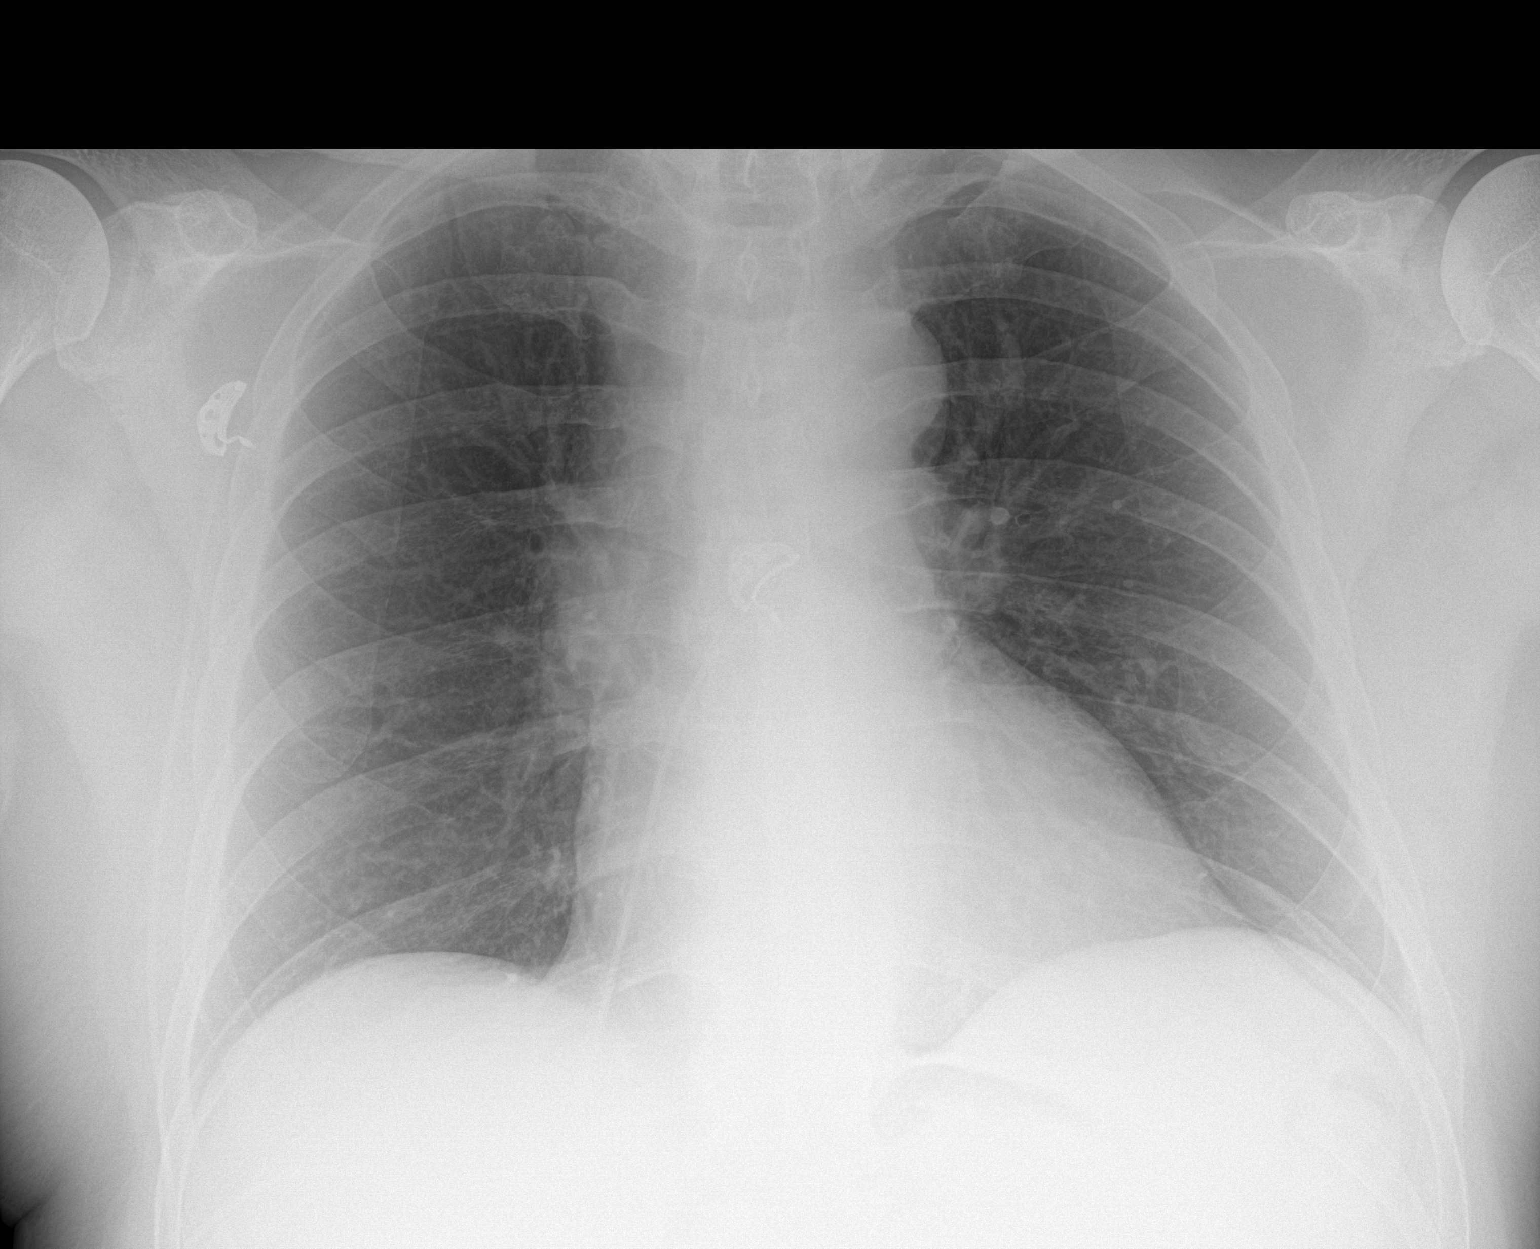

[1 of 1 positions shown; findings below may reference images not displayed]

FINDINGS: The heart size and mediastinal contours are within normal limits.
Both lungs are clear. Minimal patchy infiltrates described in the
previous CT could not be identified in the radiographs. The
visualized skeletal structures are unremarkable.
IMPRESSION: No active disease is seen in the chest.

## 2021-12-13 IMAGING — CT CT HEAD W/O CM
4 series · 16 of 47 positions shown, 18 images · non-contrast
Comparison: [DATE].

CLINICAL DATA: Headache, new or worsening (Age >= 50y)



[Series 2: head wo · axial · 0.47mm/px · z∈[-136,-16]mm · 7 of 32 slices shown, 9 images]
[im 4/32  brain]
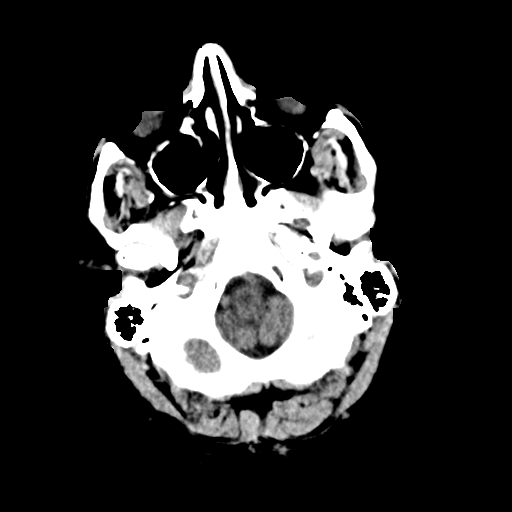
[im 4/32  bone]
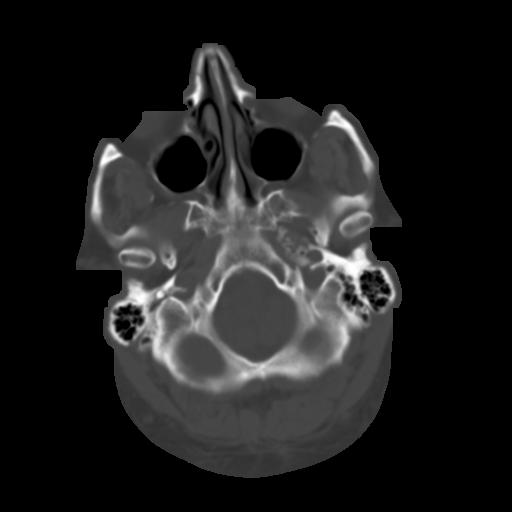
[im 8/32  brain]
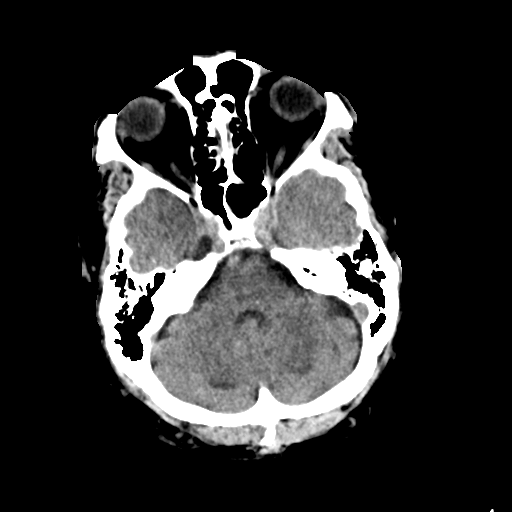
[im 12/32  brain]
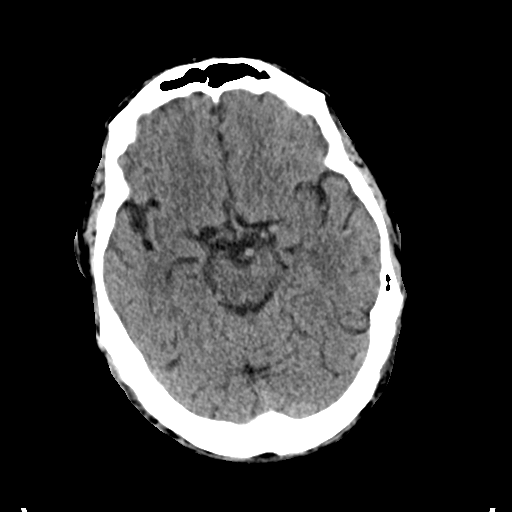
[im 16/32  brain]
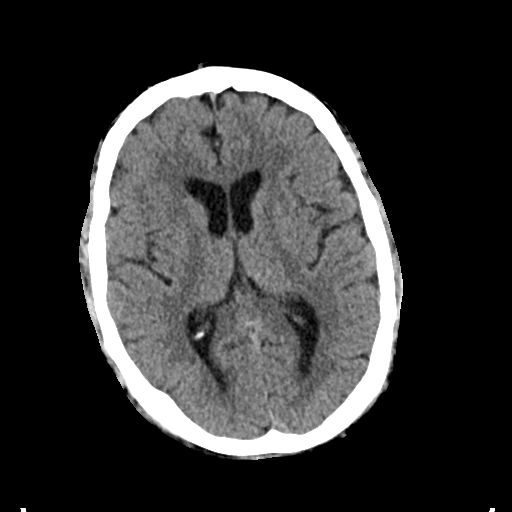
[im 20/32  brain]
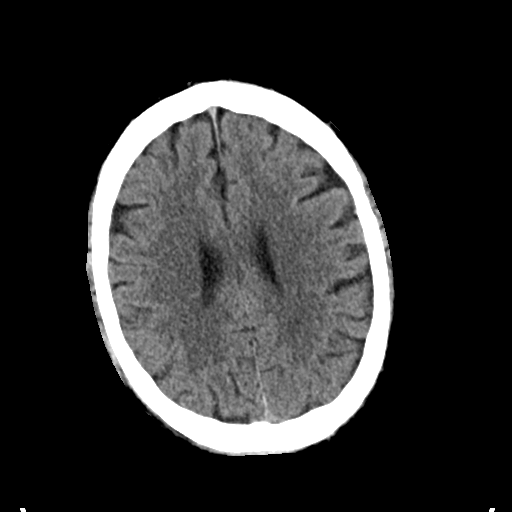
[im 20/32  bone]
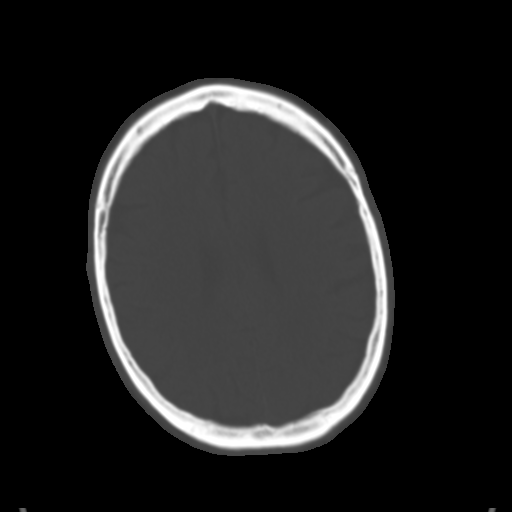
[im 24/32  brain]
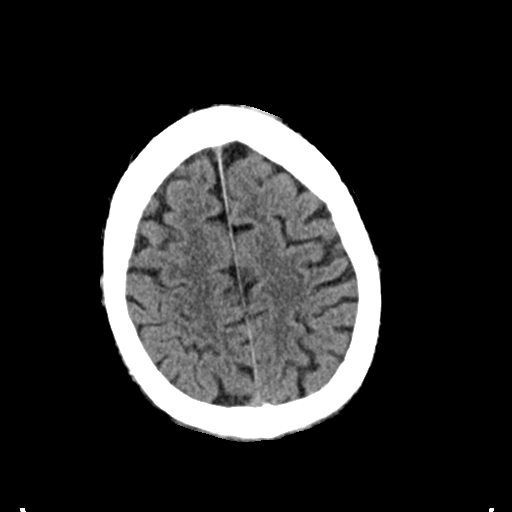
[im 28/32  brain]
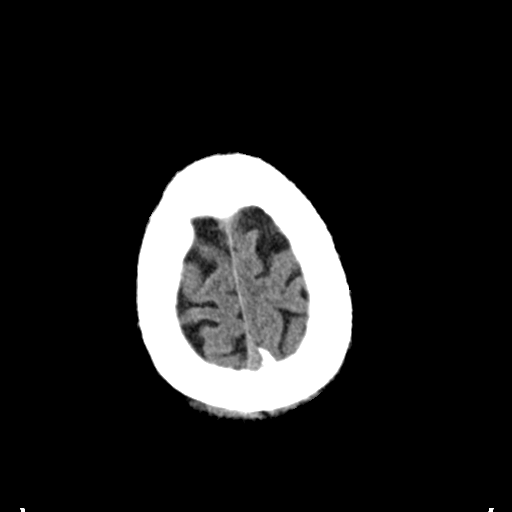

[Series 3: head bone · axial · 0.47mm/px · z∈[-137,-105]mm · 3 of 78 slices shown]
[im 8/78  bone]
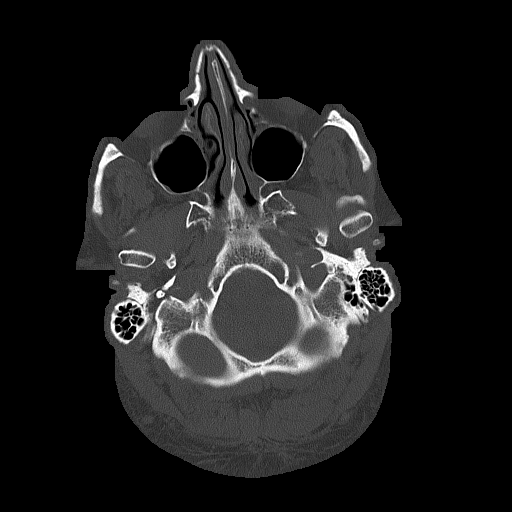
[im 16/78  bone]
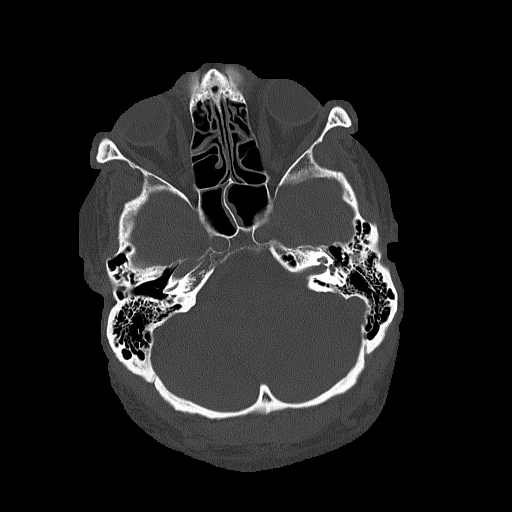
[im 24/78  bone]
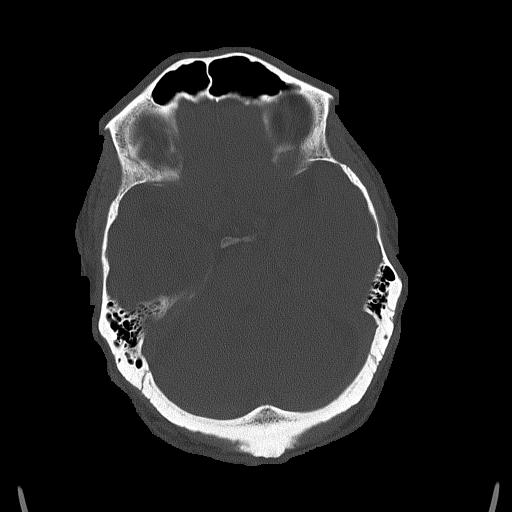

[Series 4: coronal soft tissue · coronal · 0.31mm/px · 3 of 71 slices shown]
[im 24/71  brain]
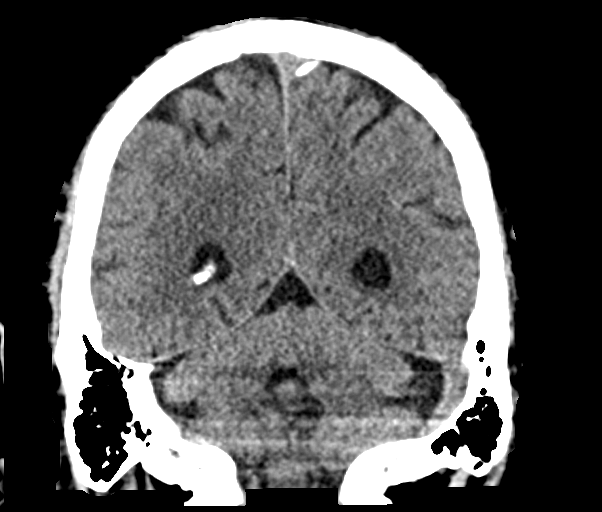
[im 32/71  brain]
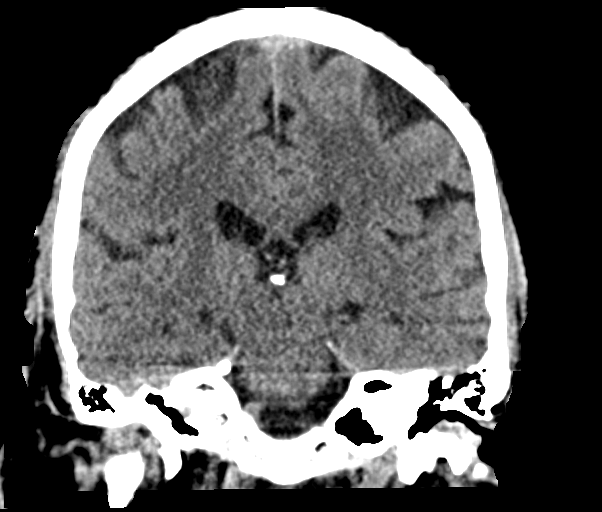
[im 39/71  brain]
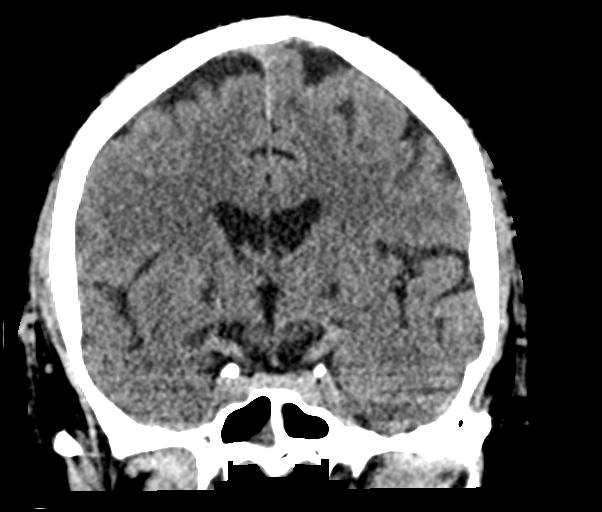

[Series 5: sagittal soft tissue · sagittal · 0.31mm/px · 3 of 63 slices shown]
[im 21/63  brain]
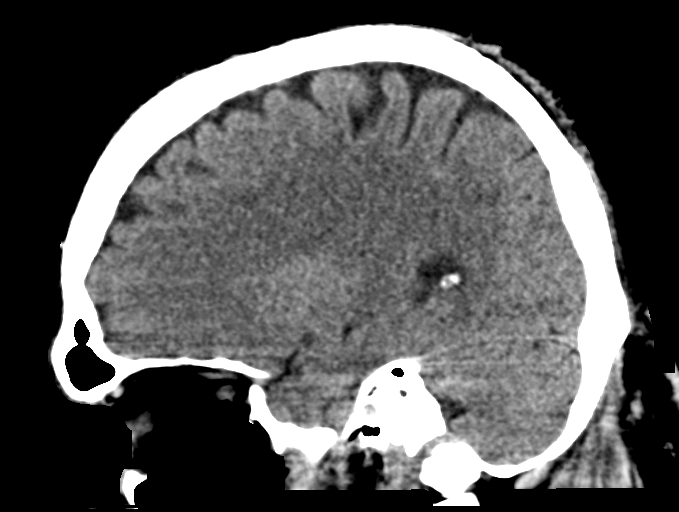
[im 32/63  brain]
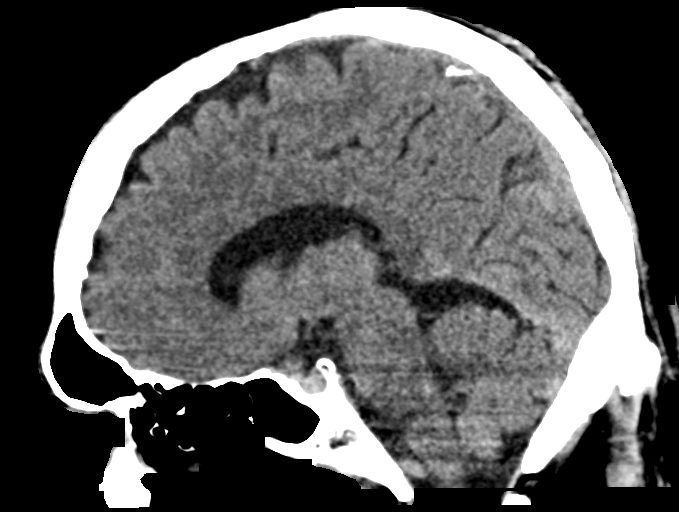
[im 42/63  brain]
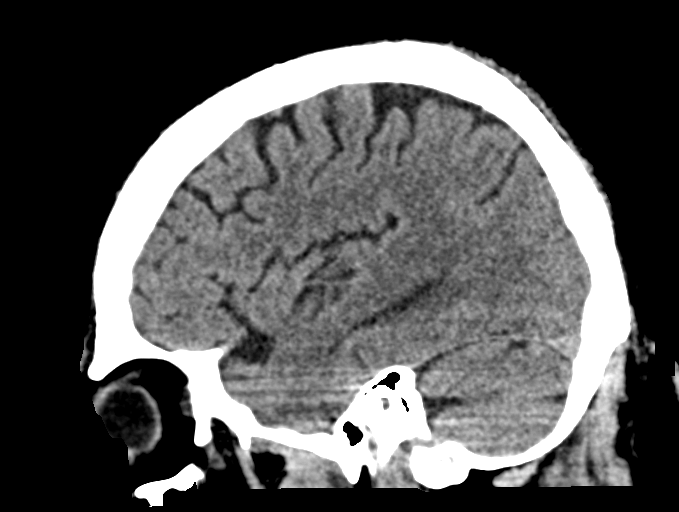

[16 of 47 positions shown; findings below may reference images not displayed]

FINDINGS: Brain: No evidence of acute infarction, hemorrhage, hydrocephalus,
extra-axial collection or mass lesion/mass effect. Chronic symmetric
hypoattenuation in bilateral globus palladi.

Vascular: No hyperdense vessel identified.

Skull: No acute fracture.

Sinuses/Orbits: Mild-to-moderate scattered paranasal sinus mucosal
thickening. Unremarkable orbits.

Other: No mastoid effusions
IMPRESSION: 1. No evidence of acute intracranial abnormality.
2. Chronic symmetric hypoattenuation in bilateral globus palladi,
potentially the sequela of prior toxic/ischemic insult.

## 2021-12-13 MED ORDER — REVEFENACIN 175 MCG/3ML IN SOLN
175.0000 ug | Freq: Every day | RESPIRATORY_TRACT | Status: DC
  Administered 2021-12-13 – 2021-12-15 (×2): 175 ug via RESPIRATORY_TRACT
  Filled 2021-12-13 (×2): qty 3

## 2021-12-13 MED ORDER — ARFORMOTEROL TARTRATE 15 MCG/2ML IN NEBU
15.0000 ug | INHALATION_SOLUTION | Freq: Two times a day (BID) | RESPIRATORY_TRACT | Status: DC
  Administered 2021-12-13 – 2021-12-15 (×6): 15 ug via RESPIRATORY_TRACT
  Filled 2021-12-13 (×6): qty 2

## 2021-12-13 MED ORDER — MELATONIN 3 MG PO TABS
3.0000 mg | ORAL_TABLET | Freq: Once | ORAL | Status: AC
  Administered 2021-12-13: 3 mg via ORAL
  Filled 2021-12-13: qty 1

## 2021-12-13 MED ORDER — BUDESONIDE 0.5 MG/2ML IN SUSP
0.5000 mg | Freq: Two times a day (BID) | RESPIRATORY_TRACT | Status: DC
  Administered 2021-12-13 – 2021-12-15 (×6): 0.5 mg via RESPIRATORY_TRACT
  Filled 2021-12-13 (×6): qty 2

## 2021-12-13 MED ORDER — PANTOPRAZOLE SODIUM 40 MG PO TBEC
40.0000 mg | DELAYED_RELEASE_TABLET | Freq: Two times a day (BID) | ORAL | Status: DC
Start: 2021-12-13 — End: 2021-12-16
  Administered 2021-12-13 – 2021-12-15 (×6): 40 mg via ORAL
  Filled 2021-12-13 (×6): qty 1

## 2021-12-13 NOTE — Plan of Care (Signed)
?  Problem: Education: ?Goal: Ability to describe self-care measures that may prevent or decrease complications (Diabetes Survival Skills Education) will improve ?Outcome: Progressing ?Goal: Individualized Educational Video(s) ?Outcome: Not Applicable ?  ?Problem: Education: ?Goal: Individualized Educational Video(s) ?Outcome: Not Applicable ?  ?Problem: Education: ?Goal: Ability to describe self-care measures that may prevent or decrease complications (Diabetes Survival Skills Education) will improve ?Outcome: Progressing ?Goal: Individualized Educational Video(s) ?Outcome: Not Applicable ?  ?Problem: Education: ?Goal: Ability to describe self-care measures that may prevent or decrease complications (Diabetes Survival Skills Education) will improve ?Outcome: Progressing ?Goal: Individualized Educational Video(s) ?Outcome: Not Applicable ?  ?Problem: Fluid Volume: ?Goal: Ability to maintain a balanced intake and output will improve ?Outcome: Progressing ?  ?Problem: Health Behavior/Discharge Planning: ?Goal: Ability to identify and utilize available resources and services will improve ?Outcome: Progressing ?Goal: Ability to manage health-related needs will improve ?Outcome: Progressing ?  ?Problem: Metabolic: ?Goal: Ability to maintain appropriate glucose levels will improve ?Outcome: Progressing ?  ?Problem: Nutritional: ?Goal: Maintenance of adequate nutrition will improve ?Outcome: Progressing ?Goal: Progress toward achieving an optimal weight will improve ?Outcome: Progressing ?  ?Problem: Skin Integrity: ?Goal: Risk for impaired skin integrity will decrease ?Outcome: Progressing ?  ?

## 2021-12-13 NOTE — Plan of Care (Signed)

## 2021-12-13 NOTE — Progress Notes (Signed)
?   12/12/21 1958  ?What Happened  ?Was fall witnessed? No  ?Was patient injured? No  ?Patient found in hallway  ?Found by Staff-comment  ?Stated prior activity ambulating-unassisted  ?Follow Up  ?MD notified yes  ?Time MD notified 1945  ?Simple treatment Ice  ?Progress note created (see row info) Yes  ?Adult Fall Risk Assessment  ?Risk Factor Category (scoring not indicated) Fall has occurred during this admission (document High fall risk);High fall risk per protocol (document High fall risk)  ?Patient Fall Risk Level High fall risk  ?Adult Fall Risk Interventions  ?Required Bundle Interventions *See Row Information* High fall risk - low, moderate, and high requirements implemented  ?Additional Interventions Use of appropriate toileting equipment (bedpan, BSC, etc.);PT/OT need assessed if change in mobility from baseline  ?Screening for Fall Injury Risk (To be completed on HIGH fall risk patients) - Assessing Need for Floor Mats  ?Risk For Fall Injury- Criteria for Floor Mats None identified - No additional interventions needed  ? ? ?

## 2021-12-13 NOTE — Consult Note (Signed)
[facility] ? ? ?Patient name: Brett White Admit date: 12/09/2021  ?DOB: 06-Apr-1961 LOS: 1  ?MRN: [id number] Consult date: 12/13/2021  ?Referring provider: Dr. Lupita Leash, [facility]: Cough  ? ? ?History:  ?61 yo male presented to ER with fever, chills, body aches, wheezing, vomiting and cough.  Symptoms presents for 2 days prior to admission.  CT chest showed changes of possible early pneumonia.  COVID/Flu swab and respiratory viral panel negative.  He was started on an IV antibiotics, prednisone and nebulizer therapy.  Tm in hospital 100.42F.  He has persistent cough and PCCM consulted.   ? ?Past medical history:  ?GERD, HTN, DM type 2, Neuropathy, CVA, Osteomyelitis s/p Lt BKA ? ?Significant events:  ?3/11 Admit, started ABx/steroids/neb tx ?3/15 PCCM consulted ? ?Studies:  ?CT angio chest 12/09/21 >> atherosclerosis, central bronchial thickening, patchy infiltrate Rt lung ? ?Micro:  ?COVID/Flu 3/11 >> negative ?Blood 3/11 >>  ?RVP 3/12 >> negative ?Legionella Ag 3/12 >> negative ?Pneumococcal Ag 3/12 >> negative ?Group A Strep PCR 3/14 >> negative ? Lines:  ? ?  ?Antibiotics:  ?Rocephin 3/11 >> ?Zithromax 3/11 >>  ? Consults:  ? ?  ? ?Interim history:  ?There is a child that lives with him and the child had an ear infection.  Mr. Brett White then started feeling ill about 6 days prior to admission.  He had cough, chest congestion and wheezing.  Also had fever 1042F.  Had episode of vomiting with coughing.  Ribs feel sore from cough.  No history of smoking.  Denies illicit drug use. ? ?Vital signs:  ?BP (!) 144/91 (BP Location: Right Arm)   Pulse 81   Temp 97.6 ?F (36.4 ?C) (Oral)   Resp 18   Ht 6\' 2"  (1.88 m)   Wt 97.5 kg   SpO2 93%   BMI 27.60 kg/m?  ? Intake/output:  ?I/O last 3 completed shifts: ?In: 1010 [P.O.:600; Other:100; IV Piggyback:310] ?Out: 2250 [Urine:2250] ?  ?Physical exam:  ? ?General - alert ?Eyes - pupils reactive ?ENT - no sinus tenderness,  no stridor ?Cardiac - regular rate/rhythm, no murmur ?Chest - faint wheeze ?Abdomen - soft, non tender, + bowel sounds ?Extremities - Lt BKA ?Skin - no rashes ?Neuro - normal strength, moves extremities, follows commands ?Psych - normal mood and behavior ?Lymphatics - no lymphadenopathy ?GU - no lesions noted ? Best practice:  ? ?DVT - lovenox ?SUP - protonix ?Nutrition - heart healthy, carb modified  ? ?Assessment/plan:  ? ?Subacute cough in setting of acute respiratory infection with bronchitis and community acquired pneumonia Rt lower lung. ?- likely has developed asthmatic bronchitis ?- Abx day 5, currently on rocephin ?- continue prednisone 20 mg daily ?- add brovana, yupelri, pulmicort ?- prn albuterol ?- continue flonase ?- prn antitussive medication ? ?Resolved hospital problems:  ? ? ?Goals of care/Family discussions:  ?Code status: full code ? ?Labs:  ? ?CMP Latest Ref Rng & Units 12/12/2021 12/10/2021 12/09/2021  ?Glucose 70 - 99 mg/dL 186(H) 189(H) 267(H)  ?BUN 6 - 20 mg/dL 25(H) 24(H) 22(H)  ?Creatinine 0.61 - 1.24 mg/dL 0.87 1.11 1.12  ?Sodium 135 - 145 mmol/L 134(L) 134(L) 132(L)  ?Potassium 3.5 - 5.1 mmol/L 4.3 4.1 4.1  ?Chloride 98 - 111 mmol/L 96(L) 104 98  ?CO2 22 - 32 mmol/L 30 25 26   ?Calcium 8.9 - 10.3 mg/dL 8.8(L) 8.3(L) 9.1  ?Total Protein 6.5 - 8.1 g/dL - - 7.9  ?Total Bilirubin 0.3 - 1.2 mg/dL - -  0.7  ?Alkaline Phos 38 - 126 U/L - - 107  ?AST 15 - 41 U/L - - 15  ?ALT 0 - 44 U/L - - 15  ? ? ?CBC Latest Ref Rng & Units 12/10/2021 12/09/2021 10/25/2021  ?WBC 4.0 - 10.5 K/uL 9.3 13.3(H) 6.4  ?Hemoglobin 13.0 - 17.0 g/dL 11.7(L) 14.3 13.9  ?Hematocrit 39.0 - 52.0 % 36.6(L) 43.2 43.5  ?Platelets 150 - 400 K/uL 267 322 343  ? ? ?ABG ?   ?Component Value Date/Time  ? HCO3 27.7 06/29/2021 1729  ? TCO2 27 10/06/2021 1617  ? O2SAT 43.6 06/29/2021 1729  ? ? ?CBG (last 3)  ?Recent Labs  ?  12/12/21 ?1253 12/12/21 ?1705 12/12/21 ?2153  ?GLUCAP 231* 214* 166*  ? ? ? ?Past surgical history:  ?He  has a past  surgical history that includes Appendectomy; Tonsillectomy; Back surgery; Amputation (Left, 02/04/2021); Amputation (Left, 02/10/2021); I & D extremity (Left, 03/03/2021); Amputation (Left, 03/22/2021); removal of back cyst; and Incision and drainage abscess (N/A, 10/06/2021). ? Social history:  ?He  reports that he has never smoked. He has never used smokeless tobacco. He reports that he does not currently use alcohol. He reports that he does not currently use drugs. ?  ?Review of systems:  ?Reviewed and negative ? Family history:  ?His family history is not on file. ?  ? ?Medications:  ? ?No current facility-administered medications on file prior to encounter.  ? ?Current Outpatient Medications on File Prior to Encounter  ?Medication Sig  ? atorvastatin (LIPITOR) 20 MG tablet Take 20 mg by mouth at bedtime.  ? cholecalciferol (VITAMIN D3) 25 MCG (1000 UNIT) tablet Take 1,000 Units by mouth daily.  ? DULoxetine (CYMBALTA) 30 MG capsule Take 1 capsule (30 mg total) by mouth daily. (Patient taking differently: Take 30 mg by mouth 2 (two) times daily.)  ? furosemide (LASIX) 20 MG tablet Take 20 mg by mouth every morning.  ? gabapentin (NEURONTIN) 300 MG capsule Take 300 mg by mouth 2 (two) times daily.  ? glimepiride (AMARYL) 4 MG tablet Take 8 mg by mouth daily.  ? losartan (COZAAR) 50 MG tablet Take 50 mg by mouth daily.  ? metFORMIN (GLUCOPHAGE) 1000 MG tablet Take 1,000 mg by mouth 2 (two) times daily with a meal.  ? pantoprazole (PROTONIX) 40 MG tablet Take 1 tablet (40 mg total) by mouth daily.  ? tirzepatide [facility]) 5 MG/0.5ML Pen Inject 5 mg into the skin every Tuesday. At noon  ? acetaminophen (TYLENOL) 500 MG tablet Take 2 tablets (1,000 mg total) by mouth every 8 (eight) hours as needed. (Patient not taking: Reported on 12/10/2021)  ? amLODipine (NORVASC) 5 MG tablet Take 1 tablet (5 mg total) by mouth daily. (Patient not taking: Reported on 12/10/2021)  ? aspirin 81 MG chewable tablet Chew 1 tablet (81 mg  total) by mouth daily. (Patient not taking: Reported on 12/10/2021)  ? atorvastatin (LIPITOR) 10 MG tablet Take 1 tablet (10 mg total) by mouth daily. (Patient not taking: Reported on 12/10/2021)  ? gabapentin (NEURONTIN) 400 MG capsule Take 1 capsule (400 mg total) by mouth 3 (three) times daily. (Patient not taking: Reported on 12/10/2021)  ? HYDROmorphone (DILAUDID) 2 MG tablet Take 1 tablet (2 mg total) by mouth every 4 (four) hours as needed for severe pain. (Patient not taking: Reported on 12/10/2021)  ? insulin aspart protamine - aspart (NOVOLOG MIX 70/30 FLEXPEN) (70-30) 100 UNIT/ML FlexPen Inject 16 units twice daily as directed. (Patient not taking:  Reported on 12/10/2021)  ? Insulin Pen Needle 32G X 4 MM MISC Use to inject insulin up to 4 times daily as needed.  ? metFORMIN (GLUCOPHAGE) 500 MG tablet Take 1 tablet (500 mg total) by mouth daily with breakfast. (Patient not taking: Reported on 12/10/2021)  ? metoprolol tartrate (LOPRESSOR) 25 MG tablet Take 1/2 tablet (12.5 mg total) by mouth 2 (two) times daily. (Patient not taking: Reported on 12/10/2021)  ? naproxen (NAPROSYN) 500 MG tablet Take 1 tablet (500 mg total) by mouth 2 (two) times daily. (Patient not taking: Reported on 12/10/2021)  ? ondansetron (ZOFRAN) 4 MG tablet Take 1 tablet (4 mg total) by mouth every 6 (six) hours as needed for nausea. (Patient not taking: Reported on 12/10/2021)  ? polyethylene glycol powder (GLYCOLAX/MIRALAX) 17 GM/SCOOP powder Take 1 capful (17 g) by mouth 2 (two) times daily. (Patient not taking: Reported on 12/10/2021)  ? senna-docusate (SENOKOT-S) 8.6-50 MG tablet Take 2 tablets by mouth 2 (two) times daily. (Patient not taking: Reported on 12/10/2021)  ?  ? ?Signature:  ?Chesley Mires, MD ?[place] ?Pager - [phone] ?12/13/2021, 10:49 AM ? ? ? ? ? ? ? ?

## 2021-12-13 NOTE — Progress Notes (Signed)
?PROGRESS NOTE ?Brett White  [id number] DOB: [birth year] DOA: 12/09/2021 ?PCP: Arthur Holms, NP  ? ?Brief Narrative/Hospital Course: ?61 year old male with DMT2 with neuropathy, HTN, hx of CVA, hx of osteomyelitis s/p BKA left leg who presents with 2-day history of cough, also having body aches with cough subjective fever, scratchy throat and shortness of breath with exertion. ?In the ED tachycardic, and mildly tachypneic otherwise stable blood pressure and oxygen saturation.  Labs showed leukocytosis 13.3, normal lactic acid COVID-19 influenza negative UA unremarkable, chest x-ray no active disease, CT angio chest no large or central PE, central peribronchial thickening with minimal patchy areas of infiltrate in right lung question pneumonia. ?Patient was placed on antibiotics and admitted for further management. ?Initial leukocytosis has resolved with IV antibiotics.  Not needing oxygen however patient continues to be symptomatic with coughing spells/wheezing.  Medication further being adjusted by increasing steroid dose.  ?  ?Subjective: ?Seen and examined this morning.  Complains of ongoing cough.  Difficulty with sleep.  But he is on room air.   ?Reported his room mate has TB details not clear ? ?Assessment and Plan: ?CAP  ?Subacute cough in the setting of acute respiratory infection with bronchitis and community-acquired pneumonia  ?Bronchitis: ?CT angio shows central peribronchial thickening with minimal patchy areas of infiltrate in the right lung suspected community-acquired pneumonia.WBC count improved afebrile but patient continues to be symptomatic.  PCCM consulted suspect he has developed asthmatic bronchitis, prednisone dose up to 20 mg continue the same adding Brovana  yuperil, Pulmicort, as needed albuterol Flonase continue as needed antitussives.  CT chest mostly clear except for minimal patchy areas on the right lung.  Strep throat was negative. ? ?Type 2 diabetes mellitus with  hyperglycemia:Uncontrolled hyperglycemia with HbA1c 11.3.  Patient was given prescription but he has not picked up from last discharge.  He was given NovoLog 70/30 16 u bid-but he has not started 70/30 at home and never picked up rx as he says he had no transport.  We have started 5 U of 7030 insulin bid, si.He will need insulin prescription on discharge.  ?Recent Labs  ?Lab 12/10/21 ?[id number] 12/10/21 ?[id number] 12/11/21 ?2113 12/12/21 ?0756 12/12/21 ?1253 12/12/21 ?1705 12/12/21 ?2153  ?GLUCAP  --    < > 120* 157* 231* 214* 166*  ?HGBA1C 11.3*  --   --   --   --   --   --   ? < > = values in this interval not displayed.  ? ?  ?Essential hypertension: Controlled. cont amlodipine metoprolol. ? ?Tachycardia:In the setting of acute illness pneumonia, heart rate improved.  Patient is on metoprolol home dose.   ? ?Chronic hyponatremia:Mild hyponatremia and stable.   ? 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  Supportive care.   ? ?Reported fall while ambulating 3/14 evening:CT head no acute finding has bruise on the head, x-ray of the left stump no fracture..  Continue fall precaution supportive care ? ?DVT prophylaxis: enoxaparin (LOVENOX) injection 40 mg Start: 12/10/21 1000 ?Code Status:   Code Status: Full Code ?Family Communication: plan of care discussed with patient at bedside. ? ?Disposition: Currently not medically stable for discharge. ?Status is: Admitted as observation ?Remains hospitalized for ongoing management of pneumonia intractable cough shortness of breath cough.  ?Objective: ?Vitals last 24 hrs: ?Vitals:  ? 12/12/21 1935 12/12/21 2118 12/13/21 0433 12/13/21 1133  ?BP: (!) 157/95  (!) 144/91   ?Pulse: 83  81   ?Resp:  18 18   ?  Temp: 100.2 ?F (37.9 ?C)  97.6 ?F (36.4 ?C)   ?TempSrc: Oral  Oral   ?SpO2: 99% 99% 93% 98%  ?Weight:      ?Height:      ? ?Weight change:  ? ?Physical Examination: ?General exam: AA0X3,older than stated age, weak appearing. ?HEENT:Oral mucosa moist, Ear/Nose  WNL grossly, dentition normal. ?Respiratory system: bilaterally diminished and mild wheezing, coughing,no use of accessory muscle ?Cardiovascular system: S1 & S2 +, No JVD,. ?Gastrointestinal system: Abdomen soft,NT,ND, BS+ ?Nervous System:Alert, awake, moving extremities and grossly nonfocal ?Extremities: edema neg,distal peripheral pulses palpable.  ?Skin: No rashes,no icterus. ?MSK: Normal muscle bulk,tone, power ? ?Medications reviewed:  ?Scheduled Meds: ? amLODipine  5 mg Oral Daily  ? arformoterol  15 mcg Nebulization BID  ? aspirin  81 mg Oral Daily  ? atorvastatin  20 mg Oral QHS  ? budesonide (PULMICORT) nebulizer solution  0.5 mg Nebulization BID  ? DULoxetine  30 mg Oral BID  ? enoxaparin (LOVENOX) injection  40 mg Subcutaneous Q24H  ? fluticasone  1 spray Each Nare Daily  ? furosemide  20 mg Oral q morning  ? gabapentin  400 mg Oral TID  ? insulin aspart  0-15 Units Subcutaneous TID WC & HS  ? insulin aspart protamine- aspart  5 Units Subcutaneous BID WC  ? loratadine  10 mg Oral Daily  ? metoprolol tartrate  12.5 mg Oral BID  ? pantoprazole  40 mg Oral BID  ? predniSONE  20 mg Oral Q breakfast  ? revefenacin  175 mcg Nebulization Daily  ? ?Continuous Infusions: ? cefTRIAXone (ROCEPHIN)  IV 2 g (12/12/21 2242)  ? ? ?  ?Diet Order   ? ?       ?  Diet heart healthy/carb modified Room service appropriate? Yes; Fluid consistency: Thin  Diet effective now       ?  ? ?  ?  ? ?  ? ?Data Reviewed: I have personally reviewed following labs and imaging studies ?CBC: ?Recent Labs  ?Lab January 02, 2022 ?1425 12/10/21 ?[id number]  ?WBC 13.3* 9.3  ?NEUTROABS 11.2*  --   ?HGB 14.3 11.7*  ?HCT 43.2 36.6*  ?MCV 83.1 86.5  ?PLT 322 267  ? ? ?Basic Metabolic Panel: ?Recent Labs  ?Lab Jan 02, 2022 ?1425 12/10/21 ?[id number] 12/12/21 ?0432  ?NA 132* 134* 134*  ?K 4.1 4.1 4.3  ?CL 98 104 96*  ?CO2 26 25 30   ?GLUCOSE 267* 189* 186*  ?BUN 22* 24* 25*  ?CREATININE 1.12 1.11 0.87  ?CALCIUM 9.1 8.3* 8.8*  ? ? ?GFR: ?Estimated Creatinine Clearance: 107.6  mL/min (by C-G formula based on SCr of 0.87 mg/dL). ?Liver Function Tests: ?Recent Labs  ?Lab 01/02/2022 ?1425  ?AST 15  ?ALT 15  ?ALKPHOS 107  ?BILITOT 0.7  ?PROT 7.9  ?ALBUMIN 3.7  ? ? ?No results for input(s): LIPASE, AMYLASE in the last 168 hours. ?No results for input(s): AMMONIA in the last 168 hours. ?Coagulation Profile: ?Recent Labs  ?Lab Jan 02, 2022 ?1425  ?INR 1.0  ? ? ?Recent Labs  ?Lab 12/11/21 ?2113 12/12/21 ?0756 12/12/21 ?1253 12/12/21 ?1705 12/12/21 ?2153  ?GLUCAP 120* 157* 231* 214* 166*  ? ? ?Recent Results (from the past 240 hour(s))  ?Blood Culture (routine x 2)     Status: None (Preliminary result)  ? Collection Time: January 02, 2022  2:24 PM  ? Specimen: BLOOD  ?Result Value Ref Range Status  ? Specimen Description   Final  ?  BLOOD SITE NOT SPECIFIED ?Performed at [facility],  [place] [address] [place], [place] [address] ?  ? Special Requests   Final  ?  BOTTLES DRAWN AEROBIC AND ANAEROBIC Blood Culture adequate volume ?Performed at [facility], [place] [address], [place], [place] [address] ?  ? Culture   Final  ?  NO GROWTH 3 DAYS ?Performed at [facility], [place] [address], [place], [place] [address] ?  ? Report Status PENDING  Incomplete  ?Resp Panel by RT-PCR (Flu A&B, Covid) Nasopharyngeal Swab     Status: None  ? Collection Time: 12/09/21  2:25 PM  ? Specimen: Nasopharyngeal Swab; Nasopharyngeal(NP) swabs in vial transport medium  ?Result Value Ref Range Status  ? SARS Coronavirus 2 by RT PCR NEGATIVE NEGATIVE Final  ?  Comment: (NOTE) ?SARS-CoV-2 target nucleic acids are NOT DETECTED. ? ?The SARS-CoV-2 RNA is generally detectable in upper respiratory ?specimens during the acute phase of infection. The lowest ?concentration of SARS-CoV-2 viral copies this assay can detect is ?138 copies/mL. A negative result does not preclude SARS-Cov-2 ?infection and should not be used as the sole basis for treatment or ?other patient management decisions. A negative result  may occur with  ?improper specimen collection/handling, submission of specimen other ?than nasopharyngeal swab, presence of viral mutation(s) within the ?areas targeted by this assay, and inadequate number of viral ?co

## 2021-12-14 DIAGNOSIS — J189 Pneumonia, unspecified organism: Secondary | ICD-10-CM | POA: Diagnosis not present

## 2021-12-14 LAB — GLUCOSE, CAPILLARY
Glucose-Capillary: 252 mg/dL — ABNORMAL HIGH (ref 70–99)
Glucose-Capillary: 170 mg/dL — ABNORMAL HIGH (ref 70–99)
Glucose-Capillary: 152 mg/dL — ABNORMAL HIGH (ref 70–99)
Glucose-Capillary: 200 mg/dL — ABNORMAL HIGH (ref 70–99)
Glucose-Capillary: 172 mg/dL — ABNORMAL HIGH (ref 70–99)

## 2021-12-14 LAB — CULTURE, BLOOD (ROUTINE X 2)
Culture: NO GROWTH
Culture: NO GROWTH
Special Requests: ADEQUATE
Special Requests: ADEQUATE

## 2021-12-14 MED ORDER — INSULIN ASPART PROT & ASPART (70-30 MIX) 100 UNIT/ML ~~LOC~~ SUSP
7.0000 [IU] | Freq: Two times a day (BID) | SUBCUTANEOUS | Status: DC
  Administered 2021-12-14 – 2021-12-15 (×3): 7 [IU] via SUBCUTANEOUS
  Filled 2021-12-14: qty 10

## 2021-12-14 MED ORDER — MELATONIN 3 MG PO TABS
3.0000 mg | ORAL_TABLET | Freq: Once | ORAL | Status: AC
  Administered 2021-12-14: 3 mg via ORAL
  Filled 2021-12-14: qty 1

## 2021-12-14 NOTE — Consult Note (Signed)
[facility] ? ? ?Patient name: Brett White Admit date: 12/09/2021  ?DOB: 1960/10/20 LOS: 2  ?MRN: [id number] Consult date: 12/13/2021  ?Referring provider: Dr. Jonathon Bellows, [facility] CC: Cough  ? ? ?History:  ?61 yo male presented to ER with fever (Tm 107F), chills, body aches, wheezing, vomiting and cough.  Symptoms presents for 2 days prior to admission.  He had sick exposure at home.  CT chest showed changes of possible early pneumonia.  COVID/Flu swab and respiratory viral panel negative.  He was started on an IV antibiotics, prednisone and nebulizer therapy.  Tm in hospital 100.7F.  He has persistent cough and PCCM consulted.   ? ?Past medical history:  ?GERD, HTN, DM type 2, Neuropathy, CVA, Osteomyelitis s/p Lt BKA ? ?Significant events:  ?3/11 Admit, started ABx/steroids/neb tx ?3/15 PCCM consulted ? ?Studies:  ?CT angio chest 12/09/21 >> atherosclerosis, central bronchial thickening, patchy infiltrate Rt lung ? ?Micro:  ?COVID/Flu 3/11 >> negative ?Blood 3/11 >>  ?RVP 3/12 >> negative ?Legionella Ag 3/12 >> negative ?Pneumococcal Ag 3/12 >> negative ?Group A Strep PCR 3/14 >> negative ? Lines:  ? ?  ?Antibiotics:  ?Rocephin 3/11 >> ?Zithromax 3/11 >>  ? Consults:  ? ?  ? ?Interim history:  ?Feels that neb treatments helped.  Still has cough, but not bringing up sputum. ? ?Vital signs:  ?BP (!) 141/96 (BP Location: Right Arm)   Pulse 77   Temp 97.8 ?F (36.6 ?C) (Oral)   Resp 18   Ht 6\' 2"  (1.88 m)   Wt 97.5 kg   SpO2 98%   BMI 27.60 kg/m?  ? Intake/output:  ?I/O last 3 completed shifts: ?In: 1610 [P.O.:1200; IV Piggyback:410] ?Out: 1750 [Urine:1750] ?  ?Physical exam:  ? ?General - alert ?Eyes - pupils reactive ?ENT - no sinus tenderness, no stridor ?Cardiac - regular rate/rhythm, no murmur ?Chest - equal breath sounds b/l, no wheezing or rales ?Abdomen - soft, non tender, + bowel sounds ?Extremities - Lt BKA ?Skin - no rashes ?Neuro - normal strength,  moves extremities, follows commands ?Psych - normal mood and behavior ? ? Best practice:  ? ?DVT - lovenox ?SUP - protonix ?Nutrition - heart healthy, carb modified  ? ?Assessment/plan:  ? ?Subacute cough in setting of acute respiratory infection with bronchitis and community acquired pneumonia Rt lower lung. ?- likely has developed asthmatic bronchitis ?- Abx day 6 currently on rocephin ?- change prednisone to 10 mg daily on 3/17 ?- continue brovana, yupelri, pulmicort ?- prn albuterol ?- continue flonase, claritin ?- prn antitussive medications ?- explained this can take several days to couple of weeks for cough to resolve ? ? ?Resolved hospital problems:  ? ? ?Goals of care/Family discussions:  ?Code status: full code ? ?Labs:  ? ?CMP Latest Ref Rng & Units 12/12/2021 12/10/2021 12/09/2021  ?Glucose 70 - 99 mg/dL 836(O) 294(T) 654(Y)  ?BUN 6 - 20 mg/dL 50(P) 54(S) 56(C)  ?Creatinine 0.61 - 1.24 mg/dL 1.27 5.17 0.01  ?Sodium 135 - 145 mmol/L 134(L) 134(L) 132(L)  ?Potassium 3.5 - 5.1 mmol/L 4.3 4.1 4.1  ?Chloride 98 - 111 mmol/L 96(L) 104 98  ?CO2 22 - 32 mmol/L 30 25 26   ?Calcium 8.9 - 10.3 mg/dL 7.4(B) 8.3(L) 9.1  ?Total Protein 6.5 - 8.1 g/dL - - 7.9  ?Total Bilirubin 0.3 - 1.2 mg/dL - - 0.7  ?Alkaline Phos 38 - 126 U/L - - 107  ?AST 15 - 41 U/L - - 15  ?ALT 0 -  44 U/L - - 15  ? ? ?CBC Latest Ref Rng & Units 12/10/2021 12/09/2021 10/25/2021  ?WBC 4.0 - 10.5 K/uL 9.3 13.3(H) 6.4  ?Hemoglobin 13.0 - 17.0 g/dL 11.7(L) 14.3 13.9  ?Hematocrit 39.0 - 52.0 % 36.6(L) 43.2 43.5  ?Platelets 150 - 400 K/uL 267 322 343  ? ? ?ABG ?   ?Component Value Date/Time  ? HCO3 27.7 06/29/2021 1729  ? TCO2 27 10/06/2021 1617  ? O2SAT 43.6 06/29/2021 1729  ? ? ?CBG (last 3)  ?Recent Labs  ?  12/13/21 ?1716 12/13/21 ?2145 12/14/21 ?0749  ?GLUCAP 269* 252* 170*  ? ? ?Signature:  ?Chesley Mires, MD ?[place] ?Pager - [phone] ?12/14/2021, 11:12 AM ? ? ? ? ? ? ? ?

## 2021-12-14 NOTE — Progress Notes (Signed)
?PROGRESS NOTE ?Brett White  [id number] DOB: 1961-02-04 DOA: 12/09/2021 ?PCP: Arthur Holms, NP  ? ?Brief Narrative/Hospital Course: ?61 year old male with DMT2 with neuropathy, HTN, hx of CVA, hx of osteomyelitis s/p BKA left leg who presents with 2-day history of cough, also having body aches with cough subjective fever, scratchy throat and shortness of breath with exertion. ?In the ED tachycardic, and mildly tachypneic otherwise stable blood pressure and oxygen saturation.  Labs showed leukocytosis 13.3, normal lactic acid COVID-19 influenza negative UA unremarkable, chest x-ray no active disease, CT angio chest no large or central PE, central peribronchial thickening with minimal patchy areas of infiltrate in right lung question pneumonia. ?Patient was placed on antibiotics and admitted for further management. ?Initial leukocytosis has resolved with IV antibiotics.  Not needing oxygen however patient continues to be symptomatic with coughing spells/wheezing.  Medication further being adjusted by increasing steroid dose.  ?  ?Subjective: ?Seen and examined this morning.  Alert awake continues to have cough but no fever not needing oxygen ?Continues to complain of sore throat. ? ?Assessment and Plan: ?CAP  ?Subacute cough in the setting of acute respiratory infection with bronchitis and community-acquired pneumonia  ?Bronchitis: ?CT angio shows central peribronchial thickening with minimal patchy areas of infiltrate in the right lung suspected community-acquired pneumonia.WBC count improved afebrile but patient continues to be symptomatic-so PCCM was consulted appreciate input-likely has asthmatic bronchitis-continue on prednisone,Brovana  yuperil, Pulmicort, as needed albuterol Flonase, Claritin, antitussive. CT chest done on admission.Strep throat was negative. ? ?T2DM with uncontrolled hyperglycemia:Uncontrolled hyperglycemia with HbA1c poorly controlled 11.3. was given NovoLog 70/30 16 u bid-but he has not  started 70/30 at home and never picked up rx as he says he had no transport.  We have started 5 U of 7030 > increase  TO 7 U BID CONT SSi.He will need insulin prescription on discharge.  ?Recent Labs  ?Lab 12/10/21 ?[id number] 12/10/21 ?[id number] 12/13/21 ?[id number] 12/13/21 ?1203 12/13/21 ?1716 12/13/21 ?2145 12/14/21 ?0749  ?GLUCAP  --    < > 158* 153* 269* 252* 170*  ?HGBA1C 11.3*  --   --   --   --   --   --   ? < > = values in this interval not displayed.  ?  ?Essential hypertension: Controlled. cont amlodipine metoprolol. ? ?Tachycardia:In the setting of acute illness pneumonia, heart rate improved.  Patient is on metoprolol home dose.   ? ?Chronic hyponatremia:Mild hyponatremia and stable.   ? 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  Supportive care.   ? ?Reported fall while ambulating 3/14 evening:CT head no acute finding has bruise on the head, x-ray of the left stump no fracture..  Continue fall precaution supportive care ? ?DVT prophylaxis: enoxaparin (LOVENOX) injection 40 mg Start: 12/10/21 1000 ?Code Status:   Code Status: Full Code ?Family Communication: plan of care discussed with patient at bedside. ? ?Disposition: Currently not medically stable for discharge. ?Status is: Admitted as observation ?Remains hospitalized for ongoing management of pneumonia intractable cough shortness of breath cough.  ?Objective: ?Vitals last 24 hrs: ?Vitals:  ? 12/13/21 1934 12/13/21 2041 12/14/21 0443 12/14/21 0755  ?BP: 128/89  (!) 141/96   ?Pulse: 82  77   ?Resp: 18  18   ?Temp: 97.8 ?F (36.6 ?C)     ?TempSrc: Oral     ?SpO2: 97% 94% 95% 98%  ?Weight:      ?Height:      ? ?Weight change:  ? ?Physical Examination: ?General exam: AA0X3,  older than stated age, weak appearing. ?HEENT:Oral mucosa moist, Ear/Nose WNL grossly, dentition normal. ?Respiratory system: bilaterally clear, no use of accessory muscle ?Cardiovascular system: S1 & S2 +, No JVD,. ?Gastrointestinal system: Abdomen soft,NT,ND,BS+ ?Nervous  System:Alert, awake, moving extremities and grossly nonfocal ?Extremities: LE ankle edema NONE, distal peripheral pulses palpable.  ?Skin: No rashes,no icterus. ?MSK: Normal muscle bulk,tone, power  ? ?Medications reviewed:  ?Scheduled Meds: ? amLODipine  5 mg Oral Daily  ? arformoterol  15 mcg Nebulization BID  ? aspirin  81 mg Oral Daily  ? atorvastatin  20 mg Oral QHS  ? budesonide (PULMICORT) nebulizer solution  0.5 mg Nebulization BID  ? DULoxetine  30 mg Oral BID  ? enoxaparin (LOVENOX) injection  40 mg Subcutaneous Q24H  ? fluticasone  1 spray Each Nare Daily  ? furosemide  20 mg Oral q morning  ? gabapentin  400 mg Oral TID  ? insulin aspart  0-15 Units Subcutaneous TID WC & HS  ? insulin aspart protamine- aspart  5 Units Subcutaneous BID WC  ? loratadine  10 mg Oral Daily  ? metoprolol tartrate  12.5 mg Oral BID  ? pantoprazole  40 mg Oral BID  ? predniSONE  20 mg Oral Q breakfast  ? revefenacin  175 mcg Nebulization Daily  ? ?Continuous Infusions: ? cefTRIAXone (ROCEPHIN)  IV 2 g (12/13/21 2119)  ? ? ?  ?Diet Order   ? ?       ?  Diet heart healthy/carb modified Room service appropriate? Yes; Fluid consistency: Thin  Diet effective now       ?  ? ?  ?  ? ?  ? ?Data Reviewed: I have personally reviewed following labs and imaging studies ?CBC: ?Recent Labs  ?Lab December 29, 2021 ?1425 12/10/21 ?[id number]  ?WBC 13.3* 9.3  ?NEUTROABS 11.2*  --   ?HGB 14.3 11.7*  ?HCT 43.2 36.6*  ?MCV 83.1 86.5  ?PLT 322 267  ? ?Basic Metabolic Panel: ?Recent Labs  ?Lab 2021/12/29 ?1425 12/10/21 ?[id number] 12/12/21 ?0432  ?NA 132* 134* 134*  ?K 4.1 4.1 4.3  ?CL 98 104 96*  ?CO2 26 25 30   ?GLUCOSE 267* 189* 186*  ?BUN 22* 24* 25*  ?CREATININE 1.12 1.11 0.87  ?CALCIUM 9.1 8.3* 8.8*  ? ?GFR: ?Estimated Creatinine Clearance: 107.6 mL/min (by C-G formula based on SCr of 0.87 mg/dL). ?Liver Function Tests: ?Recent Labs  ?Lab 2021/12/29 ?1425  ?AST 15  ?ALT 15  ?ALKPHOS 107  ?BILITOT 0.7  ?PROT 7.9  ?ALBUMIN 3.7  ? ?No results for input(s): LIPASE, AMYLASE  in the last 168 hours. ?No results for input(s): AMMONIA in the last 168 hours. ?Coagulation Profile: ?Recent Labs  ?Lab 12/29/21 ?1425  ?INR 1.0  ? ?Recent Labs  ?Lab 12/13/21 ?0832 12/13/21 ?1203 12/13/21 ?1716 12/13/21 ?2145 12/14/21 ?0749  ?GLUCAP 158* 153* 269* 252* 170*  ? ?Recent Results (from the past 240 hour(s))  ?Blood Culture (routine x 2)     Status: None (Preliminary result)  ? Collection Time: Dec 29, 2021  2:24 PM  ? Specimen: BLOOD  ?Result Value Ref Range Status  ? Specimen Description   Final  ?  BLOOD SITE NOT SPECIFIED ?Performed at [facility], [place] [address], [place], [place] [address] ?  ? Special Requests   Final  ?  BOTTLES DRAWN AEROBIC AND ANAEROBIC Blood Culture adequate volume ?Performed at [facility], [place] [address] [place], [place] [address] ?  ? Culture   Final  ?  NO GROWTH 4 DAYS ?Performed at [facility], [place] [address], [place], [place] [address] ?  ? Report Status PENDING  Incomplete  ?Resp Panel by RT-PCR (Flu A&B, Covid) Nasopharyngeal Swab     Status: None  ? Collection Time: 12/09/21  2:25 PM  ? Specimen: Nasopharyngeal Swab; Nasopharyngeal(NP) swabs in vial transport medium  ?Result Value Ref Range Status  ? SARS Coronavirus 2 by RT PCR NEGATIVE NEGATIVE Final  ?  Comment: (NOTE) ?SARS-CoV-2 target nucleic acids are NOT DETECTED. ? ?The SARS-CoV-2 RNA is generally detectable in upper respiratory ?specimens during the acute phase of infection. The lowest ?concentration of SARS-CoV-2 viral copies this assay can detect is ?138 copies/mL. A negative result does not preclude SARS-Cov-2 ?infection and should not be used as the sole basis for treatment or ?other patient management decisions. A negative result may occur with  ?improper specimen collection/handling, submission of specimen other ?than nasopharyngeal swab, presence of viral mutation(s) within the ?areas targeted by this assay, and inadequate number of viral ?copies(<138  copies/mL). A negative result must be combined with ?clinical observations, patient history, and epidemiological ?information. The expected result is Negative. ? ?Fact Sheet for Patients:  ?https://www.f

## 2021-12-15 DIAGNOSIS — J189 Pneumonia, unspecified organism: Secondary | ICD-10-CM | POA: Diagnosis not present

## 2021-12-15 LAB — GLUCOSE, CAPILLARY
Glucose-Capillary: 226 mg/dL — ABNORMAL HIGH (ref 70–99)
Glucose-Capillary: 167 mg/dL — ABNORMAL HIGH (ref 70–99)
Glucose-Capillary: 272 mg/dL — ABNORMAL HIGH (ref 70–99)
Glucose-Capillary: 218 mg/dL — ABNORMAL HIGH (ref 70–99)

## 2021-12-15 MED ORDER — LORATADINE 10 MG PO TABS: 10.0000 mg | ORAL_TABLET | Freq: Every day | ORAL | 0 refills | Status: DC

## 2021-12-15 MED ORDER — ALBUTEROL SULFATE HFA 108 (90 BASE) MCG/ACT IN AERS: 2.0000 | INHALATION_SPRAY | Freq: Four times a day (QID) | RESPIRATORY_TRACT | 0 refills | Status: DC | PRN

## 2021-12-15 MED ORDER — ADVAIR HFA 45-21 MCG/ACT IN AERO: 2.0000 | INHALATION_SPRAY | Freq: Two times a day (BID) | RESPIRATORY_TRACT | 1 refills | Status: DC

## 2021-12-15 MED ORDER — NOVOLOG MIX 70/30 FLEXPEN (70-30) 100 UNIT/ML ~~LOC~~ SUPN: PEN_INJECTOR | SUBCUTANEOUS | 3 refills | Status: DC

## 2021-12-15 MED ORDER — PREDNISONE 10 MG PO TABS: 10.0000 mg | ORAL_TABLET | Freq: Every day | ORAL | 0 refills | Status: AC

## 2021-12-15 MED ORDER — MENTHOL 3 MG MT LOZG: 1.0000 | LOZENGE | OROMUCOSAL | 0 refills | Status: DC | PRN

## 2021-12-15 MED ORDER — FLUTICASONE PROPIONATE 50 MCG/ACT NA SUSP: 1.0000 | Freq: Every day | NASAL | 2 refills | Status: DC

## 2021-12-15 NOTE — Discharge Summary (Signed)
Physician Discharge Summary  ?Jake ChurchJames Chavero ZOX:[id number] DOB: 04-17-1961 DOA: 12/09/2021 ? ?PCP: Loura BackNguyen, Kim, NP ? ?Admit date: 12/09/2021 ?Discharge date: 12/15/2021 ?Recommendations for Outpatient Follow-up:  ?Follow up with PCP in 1 weeks-call for appointment ?Please obtain BMP/CBC in one week ? ?Discharge Dispo: home ?Discharge Condition: Stable ?Code Status:   Code Status: Full Code ?Diet recommendation:  ?Diet Order   ? ?       ?  Diet heart healthy/carb modified Room service appropriate? Yes; Fluid consistency: Thin  Diet effective now       ?  ? ?  ?  ? ?  ?  ? ?Brief/Interim Summary: ?[age] year old male with DMT2 with neuropathy, HTN, hx of CVA, hx of osteomyelitis s/p BKA left leg who presents with 2-day history of cough, also having body aches with cough subjective fever, scratchy throat and shortness of breath with exertion. ?In the ED tachycardic, and mildly tachypneic otherwise stable blood pressure and oxygen saturation.  Labs showed leukocytosis 13.3, normal lactic acid COVID-19 influenza negative UA unremarkable, chest x-ray no active disease, CT angio chest no large or central PE, central peribronchial thickening with minimal patchy areas of infiltrate in right lung question pneumonia. ?Patient was placed on antibiotics and admitted for further management. ?Initial leukocytosis has resolved with IV antibiotics.  Not needing oxygen however patient continues to be symptomatic with coughing spells/wheezing.  Medication further being adjusted by increasing steroid dose.  ?He is now doing well on steroid and inhalers and being discharged home.  ? ?Discharge Diagnoses:  ?Principal Problem: ?  CAP (community acquired pneumonia) ?Active Problems: ?  Type 2 diabetes mellitus with hyperglycemia (HCC) ?  Essential hypertension ?  Tachycardia ?  Chronic hyponatremia ?  Acquired absence of left leg below knee (HCC) ? ?Assessment and Plan: ?CAP  ?Subacute cough in the setting of acute respiratory infection with  bronchitis and community-acquired pneumonia  ?Bronchitis: ?CT angio shows central peribronchial thickening with minimal patchy areas of infiltrate in the right lung suspected community-acquired pneumonia.WBC count improved afebrile , compelted antibiotics/PCCM was consulted appreciate input-likely has asthmatic bronchitis-continue on prednisone, and inhalers and okay for home today  ? ?T2DM with uncontrolled hyperglycemia:Uncontrolled hyperglycemia with HbA1c poorly controlled 11.3. was given NovoLog 70/30 16 u bid-but he has not started 70/30 at home and never picked up rx as he says he had no transport.  cont 70/30 7 u bid. He says he has insulin at home. ?Recent Labs  ?Lab 12/10/21 ?[id number] 12/10/21 ?[id number] 12/14/21 ?1204 12/14/21 ?1646 12/14/21 ?2202 12/15/21 ?0827 12/15/21 ?1213  ?GLUCAP  --    < > 152* 200* 172* 226* 167*  ?HGBA1C 11.3*  --   --   --   --   --   --   ? < > = values in this interval not displayed.  ?   ?Essential hypertension: Controlled. cont amlodipine metoprolol. ?  ?Tachycardia:In the setting of acute illness pneumonia, heart rate improved.  Patient is on metoprolol home dose.   ?  ?Chronic hyponatremia:Mild hyponatremia and stable.   ?  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  Supportive care.   ?  ?Reported fall while ambulating 3/14 evening:CT head no acute finding has bruise on the head, x-ray of the left stump no fracture..  Continue fall precaution supportive care ? ? Consults: ?PCCM ? ?Subjective: ?ALERT AWAKE. ?NO complaints ?No more coughing and feels better and ready for home today ? ?Discharge Exam: ?Vitals:  ? 12/15/21 0758 12/15/21 0802  ?  BP:    ?Pulse:    ?Resp: 13   ?Temp:    ?SpO2: 94% 94%  ? ?General: Pt is alert, awake, not in acute distress ?Cardiovascular: RRR, S1/S2 +, no rubs, no gallops ?Respiratory: CTA bilaterally, no wheezing, no rhonchi ?Abdominal: Soft, NT, ND, bowel sounds + ?Extremities: no edema, no cyanosis ? ?Discharge  Instructions ? ?Discharge Instructions   ? ? Discharge instructions   Complete by: As directed ?  ? Please call call MD or return to ER for similar or worsening recurring problem that brought you to hospital or if any fever,nausea/vomiting,abdominal pain, uncontrolled pain, chest pain,  shortness of breath or any other alarming symptoms. ? ?Please follow-up your doctor as instructed in a week time and call the office for appointment. ? ?Please avoid alcohol, smoking, or any other illicit substance and maintain healthy habits including taking your regular medications as prescribed. ? ?You were cared for by a hospitalist during your hospital stay. If you have any questions about your discharge medications or the care you received while you were in the hospital after you are discharged, you can call the unit and ask to speak with the hospitalist on call if the hospitalist that took care of you is not available. ? ?Once you are discharged, your primary care physician will handle any further medical issues. Please note that NO REFILLS for any discharge medications will be authorized once you are discharged, as it is imperative that you return to your primary care physician (or establish a relationship with a primary care physician if you do not have one) for your aftercare needs so that they can reassess your need for medications and monitor your lab values ?  ?Check blood sugar 3 times a day and bedtime at home. ?If blood sugar running above 200 less than 70 please call your MD to adjust insulin. ?If blood sugars running less 100 do not use insulin and call MD. ?If you noticed signs and symptoms of hypoglycemia or low blood sugar like jitteriness, confusion, thirst, tremor, sweating- Check blood sugar, drink sugary drink/biscuits/sweets to increase sugar level and call MD or return to ER.  ? Increase activity slowly   Complete by: As directed ?  ? ?  ? ?Allergies as of 12/15/2021   ?No Known Allergies ?  ? ?  ?Medication  List  ?  ? ?TAKE these medications   ? ?acetaminophen 500 MG tablet ?Commonly known as: TYLENOL ?Take 2 tablets (1,000 mg total) by mouth every 8 (eight) hours as needed. ?  ?Advair HFA 45-21 MCG/ACT inhaler ?Generic drug: fluticasone-salmeterol ?Inhale 2 puffs into the lungs 2 (two) times daily. ?  ?albuterol 108 (90 Base) MCG/ACT inhaler ?Commonly known as: VENTOLIN HFA ?Inhale 2 puffs into the lungs every 6 (six) hours as needed for wheezing or shortness of breath. ?  ?amLODipine 5 MG tablet ?Commonly known as: NORVASC ?Take 1 tablet (5 mg total) by mouth daily. ?  ?Aspirin Low Dose 81 MG chewable tablet ?Generic drug: aspirin ?Chew 1 tablet (81 mg total) by mouth daily. ?  ?atorvastatin 20 MG tablet ?Commonly known as: LIPITOR ?Take 20 mg by mouth at bedtime. ?What changed: Another medication with the same name was removed. Continue taking this medication, and follow the directions you see here. ?  ?cholecalciferol 25 MCG (1000 UNIT) tablet ?Commonly known as: VITAMIN D3 ?Take 1,000 Units by mouth daily. ?  ?DULoxetine 30 MG capsule ?Commonly known as: CYMBALTA ?Take 1 capsule (30 mg total) by mouth  daily. ?What changed: when to take this ?  ?fluticasone 50 MCG/ACT nasal spray ?Commonly known as: FLONASE ?Place 1 spray into both nostrils daily. ?  ?furosemide 20 MG tablet ?Commonly known as: LASIX ?Take 20 mg by mouth every morning. ?  ?gabapentin 300 MG capsule ?Commonly known as: NEURONTIN ?Take 300 mg by mouth 2 (two) times daily. ?What changed: Another medication with the same name was removed. Continue taking this medication, and follow the directions you see here. ?  ?glimepiride 4 MG tablet ?Commonly known as: AMARYL ?Take 8 mg by mouth daily. ?  ?HYDROmorphone 2 MG tablet ?Commonly known as: Dilaudid ?Take 1 tablet (2 mg total) by mouth every 4 (four) hours as needed for severe pain. ?  ?loratadine 10 MG tablet ?Commonly known as: CLARITIN ?Take 1 tablet (10 mg total) by mouth daily for 14 days. ?   ?losartan 50 MG tablet ?Commonly known as: COZAAR ?Take 50 mg by mouth daily. ?  ?menthol-cetylpyridinium 3 MG lozenge ?Commonly known as: CEPACOL ?Take 1 lozenge (3 mg total) by mouth as needed for up to 30 doses for sore th

## 2021-12-15 NOTE — TOC Transition Note (Signed)
Transition of Care (TOC) - CM/SW Discharge Note ? ? ?Patient Details  ?Name: Brett White ?MRN: [id number] ?Date of Birth: 08-25-1961 ? ?Transition of Care (TOC) CM/SW Contact:  ?Ross Ludwig, LCSW ?Phone Number: ?12/15/2021, 4:17 PM ? ? ?Clinical Narrative:    ? ?CSW was informed that patient will need EMS transport back home due to patient having a BKA.  Patient also has some weakness, CSW contacted [facility] for transport back home.  CSW signing off, please reconsult if other TOC needs arise. ? ?Final next level of care: Home/Self Care ?Barriers to Discharge: Barriers Resolved ? ? ?Patient Goals and CMS Choice ?Patient states their goals for this hospitalization and ongoing recovery are:: To return back home. ?  ?  ? ?Discharge Placement ?  ?           ?  ?  ?  ?  ? ?Discharge Plan and Services ?  ?  ?           ?  ?  ?  ?  ?  ?  ?  ?  ?  ?  ? ?Social Determinants of Health (SDOH) Interventions ?  ? ? ?Readmission Risk Interventions ?No flowsheet data found. ? ? ? ? ?

## 2021-12-15 NOTE — Progress Notes (Signed)
Patient will be discharging today later this morning.  ?

## 2021-12-15 NOTE — Care Management Important Message (Signed)
Important Message ? ?Patient Details IM Letter placed in Patients room. ?Name: Brett White ?MRN: [id number] ?Date of Birth: 1961-04-19 ? ? ?Medicare Important Message Given:  Yes ? ? ? ? ?Caren Macadam ?12/15/2021, 9:56 AM ?

## 2021-12-15 NOTE — Plan of Care (Signed)
?  Problem: Education: ?Goal: Knowledge of General Education information will improve ?Description: Including pain rating scale, medication(s)/side effects and non-pharmacologic comfort measures ?Outcome: Progressing ?  ?Problem: Clinical Measurements: ?Goal: Respiratory complications will improve ?Outcome: Progressing ?  ?Problem: Pain Managment: ?Goal: General experience of comfort will improve ?Outcome: Progressing ?  ?Problem: Safety: ?Goal: Ability to remain free from injury will improve ?Outcome: Progressing ?  ?Problem: Education: ?Goal: Ability to describe self-care measures that may prevent or decrease complications (Diabetes Survival Skills Education) will improve ?Outcome: Progressing ?  ?Problem: Coping: ?Goal: Ability to adjust to condition or change in health will improve ?Outcome: Progressing ?  ?Problem: Fluid Volume: ?Goal: Ability to maintain a balanced intake and output will improve ?Outcome: Progressing ?  ?Problem: Health Behavior/Discharge Planning: ?Goal: Ability to identify and utilize available resources and services will improve ?Outcome: Progressing ?Goal: Ability to manage health-related needs will improve ?Outcome: Progressing ?  ?Problem: Metabolic: ?Goal: Ability to maintain appropriate glucose levels will improve ?Outcome: Progressing ?  ?Problem: Nutritional: ?Goal: Maintenance of adequate nutrition will improve ?Outcome: Progressing ?Goal: Progress toward achieving an optimal weight will improve ?Outcome: Progressing ?  ?Problem: Skin Integrity: ?Goal: Risk for impaired skin integrity will decrease ?Outcome: Progressing ?  ?Problem: Tissue Perfusion: ?Goal: Adequacy of tissue perfusion will improve ?Outcome: Progressing ?  ?

## 2021-12-15 NOTE — Progress Notes (Incomplete)
Pt refusing to leave because of how late it is. Messaged on call, informed Charge Chrise,RH ?

## 2022-02-16 ENCOUNTER — Encounter (HOSPITAL_COMMUNITY): Payer: Self-pay | Admitting: *Deleted

## 2022-02-16 ENCOUNTER — Emergency Department (HOSPITAL_COMMUNITY): Payer: Medicare HMO

## 2022-02-16 ENCOUNTER — Inpatient Hospital Stay (HOSPITAL_COMMUNITY)
Admission: EM | Admit: 2022-02-16 | Discharge: 2022-02-19 | DRG: 194 | Disposition: A | Discharge status: Home / Self Care | Payer: Medicare HMO | Attending: Internal Medicine | Admitting: Internal Medicine

## 2022-02-16 ENCOUNTER — Other Ambulatory Visit: Payer: Self-pay

## 2022-02-16 DIAGNOSIS — I10 Essential (primary) hypertension: Secondary | ICD-10-CM

## 2022-02-16 DIAGNOSIS — Z7982 Long term (current) use of aspirin: Secondary | ICD-10-CM | POA: Diagnosis not present

## 2022-02-16 DIAGNOSIS — Z79899 Other long term (current) drug therapy: Secondary | ICD-10-CM | POA: Diagnosis not present

## 2022-02-16 DIAGNOSIS — Z7984 Long term (current) use of oral hypoglycemic drugs: Secondary | ICD-10-CM

## 2022-02-16 DIAGNOSIS — N179 Acute kidney failure, unspecified: Secondary | ICD-10-CM | POA: Diagnosis present

## 2022-02-16 DIAGNOSIS — I152 Hypertension secondary to endocrine disorders: Secondary | ICD-10-CM | POA: Diagnosis present

## 2022-02-16 DIAGNOSIS — E119 Type 2 diabetes mellitus without complications: Secondary | ICD-10-CM | POA: Diagnosis present

## 2022-02-16 DIAGNOSIS — J189 Pneumonia, unspecified organism: Secondary | ICD-10-CM | POA: Diagnosis present

## 2022-02-16 DIAGNOSIS — K219 Gastro-esophageal reflux disease without esophagitis: Secondary | ICD-10-CM | POA: Diagnosis present

## 2022-02-16 DIAGNOSIS — Z8673 Personal history of transient ischemic attack (TIA), and cerebral infarction without residual deficits: Secondary | ICD-10-CM

## 2022-02-16 DIAGNOSIS — E871 Hypo-osmolality and hyponatremia: Secondary | ICD-10-CM | POA: Diagnosis present

## 2022-02-16 DIAGNOSIS — Z89512 Acquired absence of left leg below knee: Secondary | ICD-10-CM | POA: Diagnosis not present

## 2022-02-16 DIAGNOSIS — E1165 Type 2 diabetes mellitus with hyperglycemia: Secondary | ICD-10-CM

## 2022-02-16 DIAGNOSIS — E785 Hyperlipidemia, unspecified: Secondary | ICD-10-CM | POA: Diagnosis present

## 2022-02-16 DIAGNOSIS — E1142 Type 2 diabetes mellitus with diabetic polyneuropathy: Secondary | ICD-10-CM | POA: Diagnosis present

## 2022-02-16 DIAGNOSIS — Z794 Long term (current) use of insulin: Secondary | ICD-10-CM | POA: Diagnosis present

## 2022-02-16 DIAGNOSIS — N189 Chronic kidney disease, unspecified: Secondary | ICD-10-CM | POA: Diagnosis present

## 2022-02-16 DIAGNOSIS — Z7985 Long-term (current) use of injectable non-insulin antidiabetic drugs: Secondary | ICD-10-CM

## 2022-02-16 LAB — COMPREHENSIVE METABOLIC PANEL
ALT: 16 U/L (ref 0–44)
AST: 20 U/L (ref 15–41)
Albumin: 4 g/dL (ref 3.5–5.0)
Alkaline Phosphatase: 116 U/L (ref 38–126)
Anion gap: 13 (ref 5–15)
BUN: 23 mg/dL — ABNORMAL HIGH (ref 6–20)
CO2: 25 mmol/L (ref 22–32)
Calcium: 9.2 mg/dL (ref 8.9–10.3)
Chloride: 95 mmol/L — ABNORMAL LOW (ref 98–111)
Creatinine, Ser: 1.5 mg/dL — ABNORMAL HIGH (ref 0.61–1.24)
GFR, Estimated: 54 mL/min — ABNORMAL LOW (ref >60)
Glucose, Bld: 437 mg/dL — ABNORMAL HIGH (ref 70–99)
Potassium: 4.5 mmol/L (ref 3.5–5.1)
Sodium: 133 mmol/L — ABNORMAL LOW (ref 135–145)
Total Bilirubin: 1.1 mg/dL (ref 0.3–1.2)
Total Protein: 8.1 g/dL (ref 6.5–8.1)

## 2022-02-16 LAB — URINALYSIS, ROUTINE W REFLEX MICROSCOPIC
Bacteria, UA: NONE SEEN
Bilirubin Urine: NEGATIVE
Glucose, UA: >=500 mg/dL — AB
Hgb urine dipstick: NEGATIVE
Ketones, ur: 5 mg/dL — AB
Leukocytes,Ua: NEGATIVE
Nitrite: NEGATIVE
Protein, ur: 100 mg/dL — AB
Specific Gravity, Urine: 1.033 — ABNORMAL HIGH (ref 1.005–1.030)
pH: 5 (ref 5.0–8.0)

## 2022-02-16 LAB — STREP PNEUMONIAE URINARY ANTIGEN: Strep Pneumo Urinary Antigen: NEGATIVE

## 2022-02-16 LAB — LACTIC ACID, PLASMA
Lactic Acid, Venous: 1.8 mmol/L (ref 0.5–1.9)
Lactic Acid, Venous: 2.4 mmol/L (ref 0.5–1.9)
Lactic Acid, Venous: 1.6 mmol/L (ref 0.5–1.9)

## 2022-02-16 LAB — CBC
HCT: 44.6 % (ref 39.0–52.0)
Hemoglobin: 15 g/dL (ref 13.0–17.0)
MCH: 28.3 pg (ref 26.0–34.0)
MCHC: 33.6 g/dL (ref 30.0–36.0)
MCV: 84.2 fL (ref 80.0–100.0)
Platelets: 249 10*3/uL (ref 150–400)
RBC: 5.3 MIL/uL (ref 4.22–5.81)
RDW: 13 % (ref 11.5–15.5)
WBC: 7 10*3/uL (ref 4.0–10.5)
nRBC: 0 % (ref 0.0–0.2)

## 2022-02-16 LAB — MAGNESIUM: Magnesium: 2.1 mg/dL (ref 1.7–2.4)

## 2022-02-16 LAB — APTT: aPTT: 24 seconds (ref 24–36)

## 2022-02-16 LAB — LIPASE, BLOOD: Lipase: 20 U/L (ref 11–51)

## 2022-02-16 LAB — PROTIME-INR
INR: 1 (ref 0.8–1.2)
Prothrombin Time: 12.9 seconds (ref 11.4–15.2)

## 2022-02-16 LAB — CBG MONITORING, ED: Glucose-Capillary: 251 mg/dL — ABNORMAL HIGH (ref 70–99)

## 2022-02-16 LAB — PHOSPHORUS: Phosphorus: 3.6 mg/dL (ref 2.5–4.6)

## 2022-02-16 LAB — GLUCOSE, CAPILLARY: Glucose-Capillary: 221 mg/dL — ABNORMAL HIGH (ref 70–99)

## 2022-02-16 LAB — GROUP A STREP BY PCR: Group A Strep by PCR: NOT DETECTED

## 2022-02-16 IMAGING — CR DG CHEST 2V
2 series · 2 of 2 positions shown · non-contrast
Comparison: [DATE]

CLINICAL DATA: Cough for 1 week.  Recent pneumonia.

EXAM:
CHEST - 2 VIEW

[w chest pa]
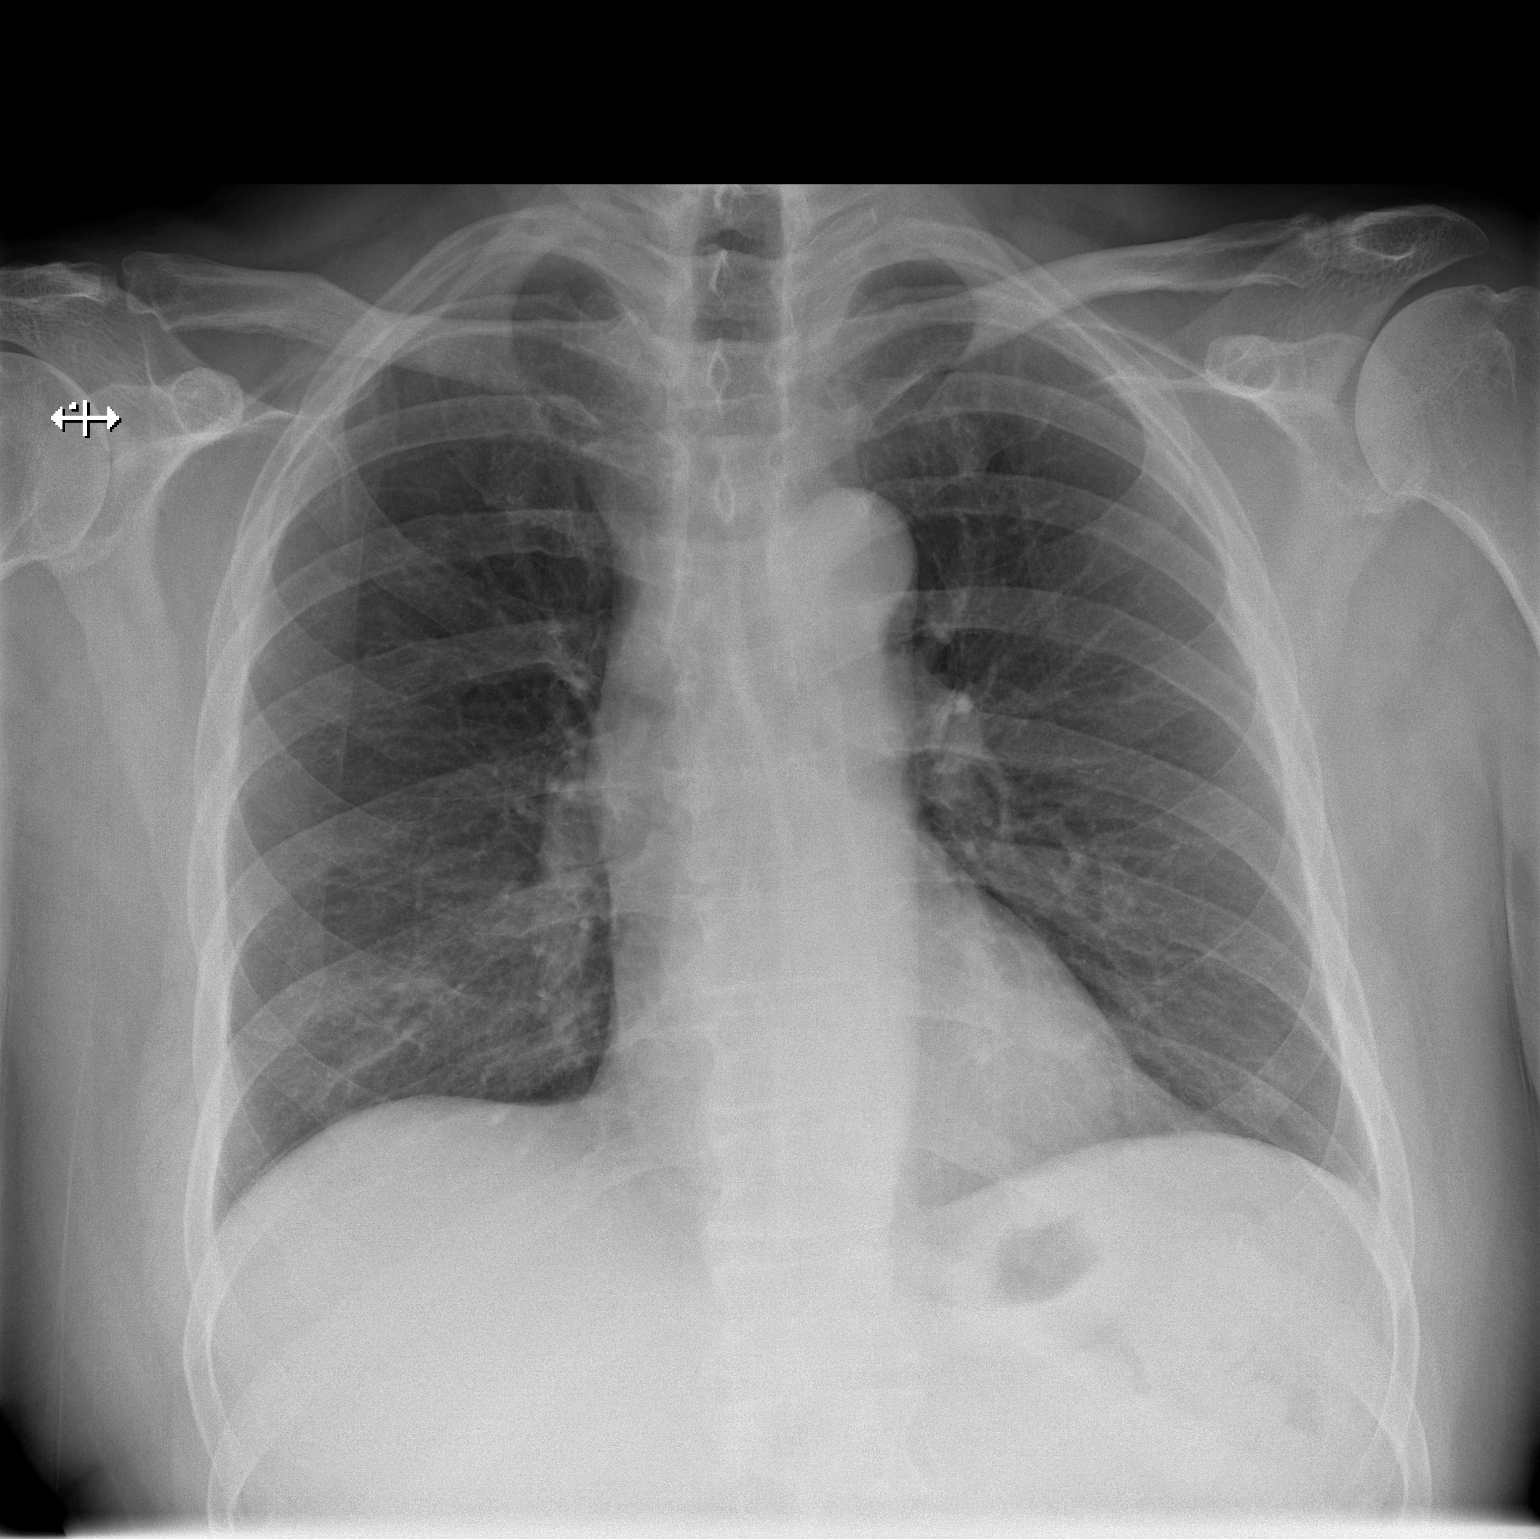

[w chest lat]
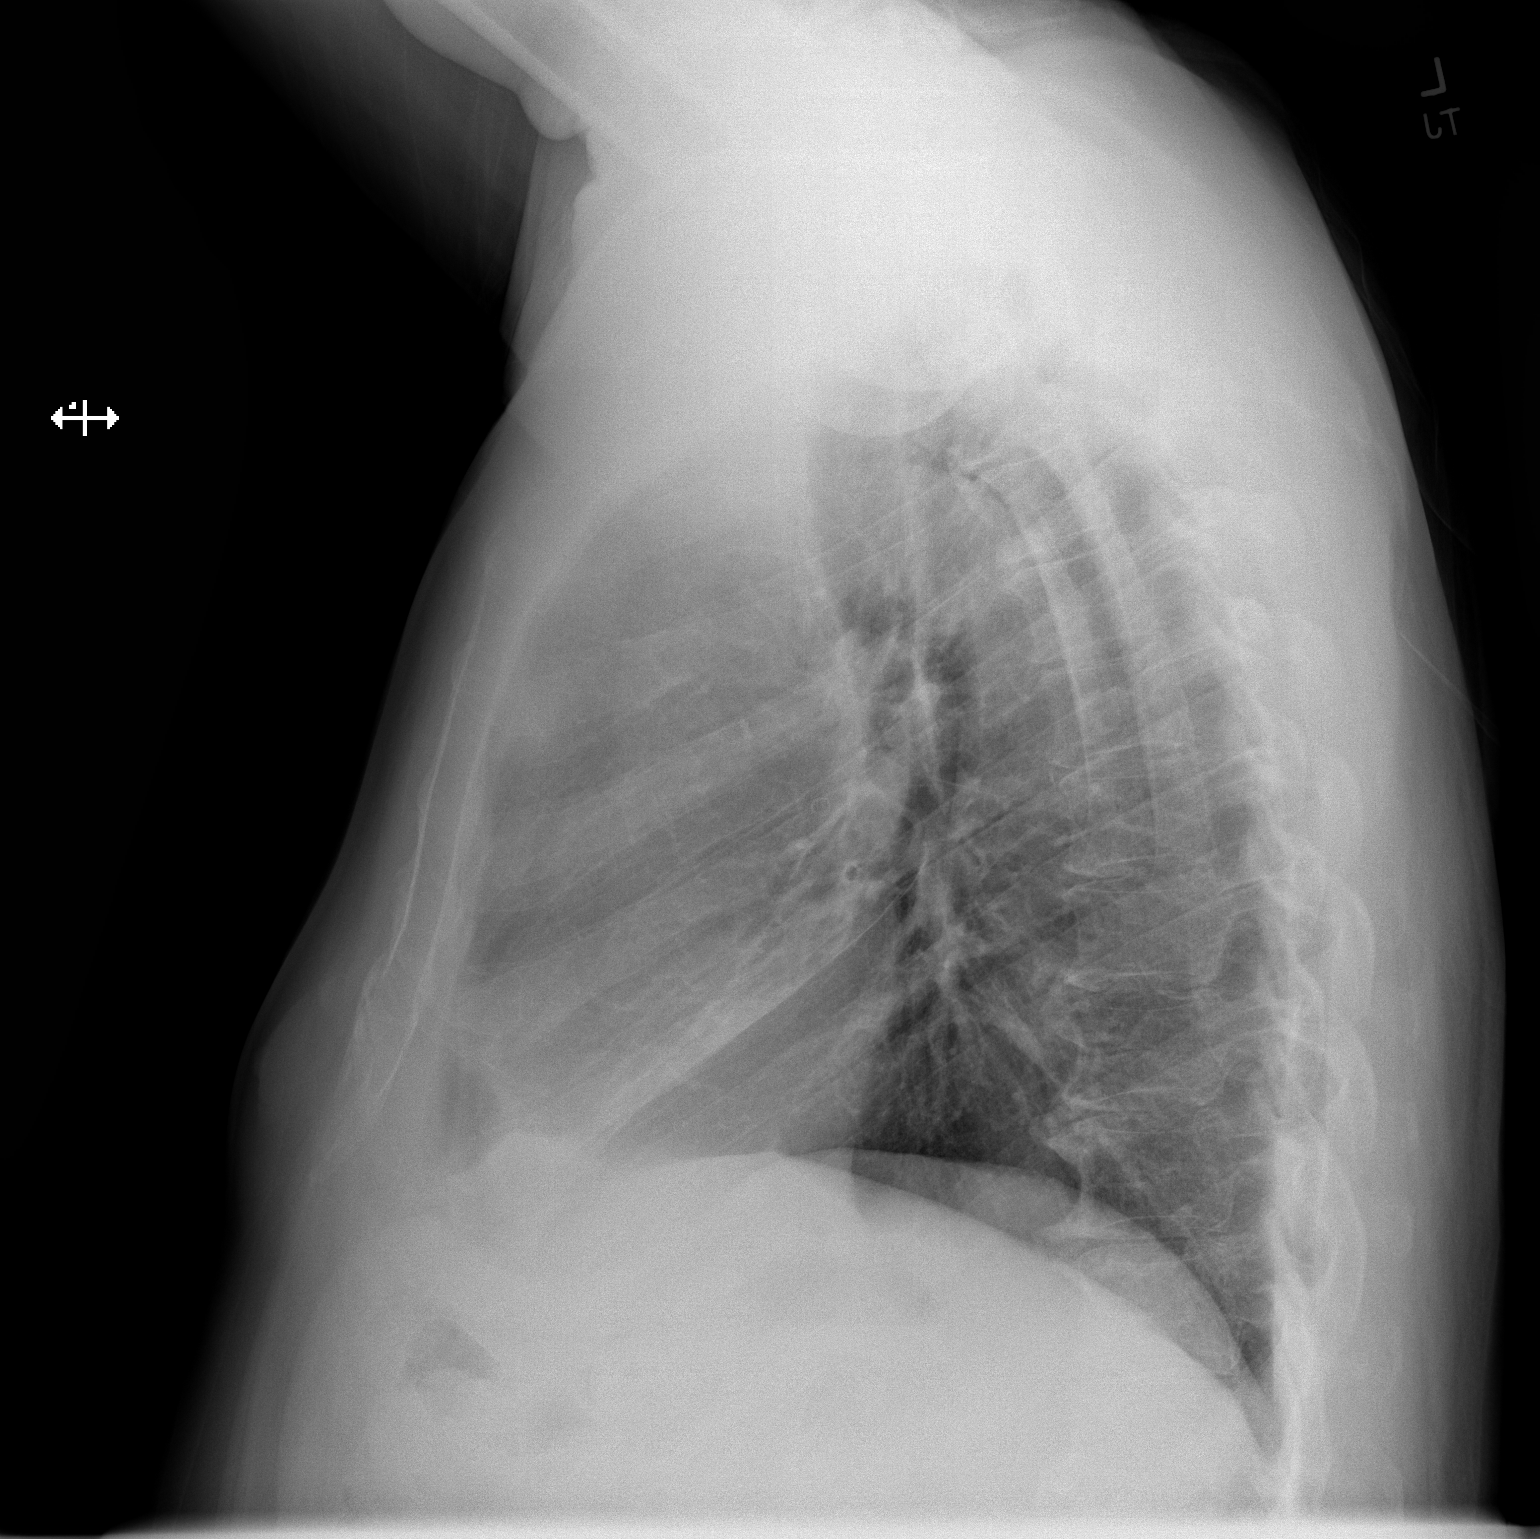

[2 of 2 positions shown; findings below may reference images not displayed]

FINDINGS: The heart size and mediastinal contours are within normal limits.
Mild infiltrate is seen in the right middle lobe, suspicious for
pneumonia. Left lung is clear. No evidence of pleural effusion.
IMPRESSION: Mild right middle lobe infiltrate, suspicious for pneumonia.
Recommend continued chest radiographic follow-up to confirm
resolution.

## 2022-02-16 MED ORDER — METFORMIN HCL 500 MG PO TABS: 1000.0000 mg | ORAL_TABLET | Freq: Two times a day (BID) | ORAL | Status: DC

## 2022-02-16 MED ORDER — LABETALOL HCL 5 MG/ML IV SOLN: 20.0000 mg | INTRAVENOUS | Status: DC | PRN

## 2022-02-16 MED ORDER — INSULIN ASPART 100 UNIT/ML IJ SOLN
0.0000 [IU] | Freq: Three times a day (TID) | INTRAMUSCULAR | Status: DC
  Filled 2022-02-16: qty 0.15

## 2022-02-16 MED ORDER — ATORVASTATIN CALCIUM 10 MG PO TABS
20.0000 mg | ORAL_TABLET | Freq: Every day | ORAL | Status: DC
  Administered 2022-02-16 – 2022-02-18 (×3): 20 mg via ORAL
  Filled 2022-02-16 (×3): qty 2

## 2022-02-16 MED ORDER — AMLODIPINE BESYLATE 5 MG PO TABS
5.0000 mg | ORAL_TABLET | Freq: Every day | ORAL | Status: DC
  Administered 2022-02-16 – 2022-02-19 (×4): 5 mg via ORAL
  Filled 2022-02-16 (×4): qty 1

## 2022-02-16 MED ORDER — MOMETASONE FURO-FORMOTEROL FUM 100-5 MCG/ACT IN AERO
2.0000 | INHALATION_SPRAY | Freq: Two times a day (BID) | RESPIRATORY_TRACT | Status: DC
  Administered 2022-02-17 – 2022-02-19 (×6): 2 via RESPIRATORY_TRACT
  Filled 2022-02-16: qty 8.8

## 2022-02-16 MED ORDER — ENOXAPARIN SODIUM 40 MG/0.4ML IJ SOSY
40.0000 mg | PREFILLED_SYRINGE | INTRAMUSCULAR | Status: DC
  Administered 2022-02-16 – 2022-02-18 (×3): 40 mg via SUBCUTANEOUS
  Filled 2022-02-16 (×3): qty 0.4

## 2022-02-16 MED ORDER — ALBUTEROL SULFATE (2.5 MG/3ML) 0.083% IN NEBU: 2.5000 mg | INHALATION_SOLUTION | Freq: Four times a day (QID) | RESPIRATORY_TRACT | Status: DC

## 2022-02-16 MED ORDER — TIZANIDINE HCL 4 MG PO TABS
4.0000 mg | ORAL_TABLET | Freq: Three times a day (TID) | ORAL | Status: DC | PRN
  Administered 2022-02-17 – 2022-02-19 (×5): 4 mg via ORAL
  Filled 2022-02-16 (×5): qty 1

## 2022-02-16 MED ORDER — DAPAGLIFLOZIN PROPANEDIOL 10 MG PO TABS
10.0000 mg | ORAL_TABLET | Freq: Every morning | ORAL | Status: DC
  Administered 2022-02-17 – 2022-02-19 (×3): 10 mg via ORAL
  Filled 2022-02-16 (×3): qty 1

## 2022-02-16 MED ORDER — HYDROCODONE-ACETAMINOPHEN 10-325 MG PO TABS
1.0000 | ORAL_TABLET | Freq: Four times a day (QID) | ORAL | Status: DC | PRN
  Administered 2022-02-16 – 2022-02-17 (×4): 1 via ORAL
  Filled 2022-02-16 (×5): qty 1

## 2022-02-16 MED ORDER — ALBUTEROL SULFATE (2.5 MG/3ML) 0.083% IN NEBU: 2.5000 mg | INHALATION_SOLUTION | RESPIRATORY_TRACT | Status: DC | PRN

## 2022-02-16 MED ORDER — IPRATROPIUM-ALBUTEROL 0.5-2.5 (3) MG/3ML IN SOLN
3.0000 mL | Freq: Three times a day (TID) | RESPIRATORY_TRACT | Status: DC
  Administered 2022-02-17 – 2022-02-19 (×7): 3 mL via RESPIRATORY_TRACT
  Filled 2022-02-16 (×7): qty 3

## 2022-02-16 MED ORDER — SODIUM CHLORIDE 0.9 % IV SOLN
500.0000 mg | INTRAVENOUS | Status: DC
  Administered 2022-02-16 – 2022-02-18 (×3): 500 mg via INTRAVENOUS
  Filled 2022-02-16 (×4): qty 5

## 2022-02-16 MED ORDER — SODIUM CHLORIDE 0.9 % IV SOLN
1.0000 g | Freq: Once | INTRAVENOUS | Status: AC
  Administered 2022-02-16: 1 g via INTRAVENOUS
  Filled 2022-02-16: qty 10

## 2022-02-16 MED ORDER — DULOXETINE HCL 30 MG PO CPEP
60.0000 mg | ORAL_CAPSULE | Freq: Two times a day (BID) | ORAL | Status: DC
  Administered 2022-02-16 – 2022-02-19 (×6): 60 mg via ORAL
  Filled 2022-02-16 (×6): qty 2

## 2022-02-16 MED ORDER — LACTATED RINGERS IV BOLUS
1000.0000 mL | Freq: Once | INTRAVENOUS | Status: AC
  Administered 2022-02-16: 1000 mL via INTRAVENOUS

## 2022-02-16 MED ORDER — ACETAMINOPHEN 325 MG PO TABS
650.0000 mg | ORAL_TABLET | Freq: Four times a day (QID) | ORAL | Status: DC | PRN
  Administered 2022-02-17 – 2022-02-18 (×5): 650 mg via ORAL
  Filled 2022-02-16 (×6): qty 2

## 2022-02-16 MED ORDER — IPRATROPIUM BROMIDE 0.02 % IN SOLN: 0.5000 mg | Freq: Four times a day (QID) | RESPIRATORY_TRACT | Status: DC

## 2022-02-16 MED ORDER — IPRATROPIUM-ALBUTEROL 0.5-2.5 (3) MG/3ML IN SOLN
3.0000 mL | Freq: Four times a day (QID) | RESPIRATORY_TRACT | Status: DC
  Administered 2022-02-16: 3 mL via RESPIRATORY_TRACT
  Filled 2022-02-16: qty 3

## 2022-02-16 MED ORDER — METOPROLOL TARTRATE 12.5 MG HALF TABLET
12.5000 mg | ORAL_TABLET | Freq: Two times a day (BID) | ORAL | Status: DC
  Administered 2022-02-16 – 2022-02-19 (×6): 12.5 mg via ORAL
  Filled 2022-02-16 (×6): qty 1

## 2022-02-16 MED ORDER — KETOROLAC TROMETHAMINE 30 MG/ML IJ SOLN
30.0000 mg | Freq: Once | INTRAMUSCULAR | Status: AC
Start: 2022-02-16 — End: 2022-02-16
  Administered 2022-02-16: 30 mg via INTRAVENOUS
  Filled 2022-02-16: qty 1

## 2022-02-16 MED ORDER — ONDANSETRON HCL 4 MG/2ML IJ SOLN: 4.0000 mg | Freq: Four times a day (QID) | INTRAMUSCULAR | Status: DC | PRN

## 2022-02-16 MED ORDER — HYDROMORPHONE HCL 1 MG/ML IJ SOLN
1.0000 mg | Freq: Once | INTRAMUSCULAR | Status: AC
  Administered 2022-02-16: 1 mg via INTRAVENOUS
  Filled 2022-02-16: qty 1

## 2022-02-16 MED ORDER — PROCHLORPERAZINE EDISYLATE 10 MG/2ML IJ SOLN
10.0000 mg | Freq: Once | INTRAMUSCULAR | Status: AC
  Administered 2022-02-16: 10 mg via INTRAVENOUS
  Filled 2022-02-16: qty 2

## 2022-02-16 MED ORDER — BENZONATATE 100 MG PO CAPS
200.0000 mg | ORAL_CAPSULE | Freq: Three times a day (TID) | ORAL | Status: DC | PRN
  Administered 2022-02-16 – 2022-02-18 (×5): 200 mg via ORAL
  Filled 2022-02-16 (×5): qty 2

## 2022-02-16 MED ORDER — INSULIN ASPART 100 UNIT/ML IJ SOLN
12.0000 [IU] | Freq: Once | INTRAMUSCULAR | Status: AC
  Administered 2022-02-16: 12 [IU] via SUBCUTANEOUS
  Filled 2022-02-16: qty 0.12

## 2022-02-16 MED ORDER — LOSARTAN POTASSIUM 25 MG PO TABS: 50.0000 mg | ORAL_TABLET | Freq: Every day | ORAL | Status: DC

## 2022-02-16 MED ORDER — ACETAMINOPHEN 650 MG RE SUPP
650.0000 mg | Freq: Four times a day (QID) | RECTAL | Status: DC | PRN
Start: 2022-02-16 — End: 2022-02-19

## 2022-02-16 MED ORDER — SODIUM CHLORIDE 0.9 % IV BOLUS
500.0000 mL | Freq: Once | INTRAVENOUS | Status: AC
  Administered 2022-02-16: 500 mL via INTRAVENOUS

## 2022-02-16 MED ORDER — ONDANSETRON HCL 4 MG PO TABS: 4.0000 mg | ORAL_TABLET | Freq: Four times a day (QID) | ORAL | Status: DC | PRN

## 2022-02-16 MED ORDER — SODIUM CHLORIDE 0.9 % IV SOLN
2.0000 g | INTRAVENOUS | Status: DC
  Administered 2022-02-17 – 2022-02-18 (×2): 2 g via INTRAVENOUS
  Filled 2022-02-16 (×3): qty 20

## 2022-02-16 MED ORDER — MENTHOL 3 MG MT LOZG
1.0000 | LOZENGE | OROMUCOSAL | Status: DC | PRN
  Administered 2022-02-16 – 2022-02-18 (×2): 3 mg via ORAL
  Filled 2022-02-16 (×2): qty 9

## 2022-02-16 MED ORDER — GABAPENTIN 300 MG PO CAPS
300.0000 mg | ORAL_CAPSULE | Freq: Two times a day (BID) | ORAL | Status: DC
  Administered 2022-02-16: 300 mg via ORAL
  Filled 2022-02-16: qty 1

## 2022-02-16 MED ORDER — PANTOPRAZOLE SODIUM 40 MG PO TBEC
40.0000 mg | DELAYED_RELEASE_TABLET | Freq: Every day | ORAL | Status: DC
  Administered 2022-02-16 – 2022-02-19 (×4): 40 mg via ORAL
  Filled 2022-02-16 (×4): qty 1

## 2022-02-16 MED ORDER — DIPHENHYDRAMINE HCL 50 MG/ML IJ SOLN
25.0000 mg | Freq: Once | INTRAMUSCULAR | Status: AC
  Administered 2022-02-16: 25 mg via INTRAVENOUS
  Filled 2022-02-16: qty 1

## 2022-02-16 MED ORDER — GLIPIZIDE ER 5 MG PO TB24: 5.0000 mg | ORAL_TABLET | Freq: Every morning | ORAL | Status: DC

## 2022-02-16 MED ORDER — METOPROLOL TARTRATE 5 MG/5ML IV SOLN
5.0000 mg | Freq: Once | INTRAVENOUS | Status: AC
  Administered 2022-02-16: 5 mg via INTRAVENOUS
  Filled 2022-02-16: qty 5

## 2022-02-16 NOTE — ED Triage Notes (Signed)
Per EMS, patient from home, c/o sore throat, cough, and HA x1 week. Reports taking antibiotics x1 week to treat pneumonia with little relief.  BP 140/86 HR 116 RR 20 98% RA 127 CBG

## 2022-02-16 NOTE — ED Provider Notes (Signed)
[facility] Provider Note  CSN: [id number] Arrival date & time: 02/16/22 1141  Chief Complaint(s) Cough, Emesis, and Sore Throat  HPI Brett White is a 61 y.o. male with PMH T2DM, HTN, osteomyelitis status post left BKA, recent admission on 12/09/2021 for community-acquired pneumonia who presents the emergency department for evaluation of cough, emesis and sore throat.  Patient states that over the last 1 week he has had worsening cough, fever, chills and associated vomiting.  After the vomiting he has had an associated sore throat.  He states that he feels like this is similar to his last presentation with pneumonia.  He also endorses a headache.  He denies abdominal pain, diarrhea, dysuria, increased frequency or other systemic symptoms.   Past Medical History Past Medical History:  Diagnosis Date   GERD (gastroesophageal reflux disease)    HTN (hypertension)    Insulin dependent type 2 diabetes mellitus (HCC)    Neuropathy    Stroke [facility])    Patient Active Problem List   Diagnosis Date Noted   CAP (community acquired pneumonia) 12/09/2021   Chronic hyponatremia 12/09/2021   Type 2 diabetes mellitus with hyperglycemia (HCC) 12/09/2021   Tachycardia 12/09/2021   AKI (acute kidney injury) (HCC) 10/04/2021   Cellulitis 10/03/2021   Acquired absence of left leg below knee (HCC) 04/05/2021   Left foot infection 03/17/2021   Pain in left ankle and joints of left foot    Back abscess 03/03/2021   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   Uncontrolled type 2 diabetes mellitus with hyperglycemia (HCC) 02/03/2021   Essential hypertension 02/03/2021   Lactic acidosis 02/03/2021   Diabetic peripheral neuropathy associated with type 2 diabetes mellitus (HCC) 02/03/2021   Osteomyelitis (HCC) 02/03/2021   Home Medication(s) Prior to Admission medications   Medication Sig Start Date End Date Taking? Authorizing Provider  albuterol  (VENTOLIN HFA) 108 (90 Base) MCG/ACT inhaler Inhale 2 puffs into the lungs every 6 (six) hours as needed for wheezing or shortness of breath. 12/15/21  Yes Lanae Boast, MD  amLODipine (NORVASC) 5 MG tablet Take 1 tablet (5 mg total) by mouth daily. 10/20/21  Yes Ghimire, Werner Lean, MD  atorvastatin (LIPITOR) 20 MG tablet Take 20 mg by mouth at bedtime. 11/07/21  Yes [provider]  cyclobenzaprine (FLEXERIL) 5 MG tablet Take 5 mg by mouth 3 (three) times daily as needed for muscle spasms. 02/07/22  Yes [provider]  DULoxetine (CYMBALTA) 60 MG capsule 60 mg 2 (two) times daily. 02/07/22  Yes [provider]  FARXIGA 10 MG TABS tablet Take 10 mg by mouth every morning. 02/07/22  Yes [provider]  fluticasone (FLONASE) 50 MCG/ACT nasal spray Place 1 spray into both nostrils daily. Patient taking differently: Place 1 spray into both nostrils daily as needed for allergies. 12/15/21  Yes Lanae Boast, MD  fluticasone-salmeterol (ADVAIR HFA) 45-21 MCG/ACT inhaler Inhale 2 puffs into the lungs 2 (two) times daily. 12/15/21  Yes Janeann Forehand D, NP  gabapentin (NEURONTIN) 300 MG capsule Take 300 mg by mouth 2 (two) times daily. 12/04/21  Yes [provider]  glipiZIDE (GLUCOTROL XL) 5 MG 24 hr tablet Take 5 mg by mouth every morning. 02/03/22  Yes [provider]  HYDROcodone-acetaminophen (NORCO) 10-325 MG tablet Take 1 tablet by mouth 4 (four) times daily as needed for severe pain. 01/11/22  Yes [provider]  losartan (COZAAR) 50 MG tablet Take 50 mg by mouth daily. 11/07/21  Yes [provider]  metFORMIN (GLUCOPHAGE) 1000 MG tablet Take 1,000 mg by mouth 2 (two) times daily with a meal.   Yes [provider]  metoprolol tartrate (LOPRESSOR) 25 MG tablet Take 1/2 tablet (12.5 mg total) by mouth 2 (two) times daily. 10/20/21  Yes Ghimire, Werner LeanShanker M, MD  naproxen (NAPROSYN) 500 MG tablet Take 1 tablet (500 mg total) by mouth 2 (two)  times daily. Patient taking differently: Take 500 mg by mouth daily as needed for moderate pain. 10/26/21  Yes Dione BoozeGlick, David, MD  ondansetron (ZOFRAN) 4 MG tablet Take 1 tablet (4 mg total) by mouth every 6 (six) hours as needed for nausea. 10/26/21  Yes Dione BoozeGlick, David, MD  Oxycodone HCl 10 MG TABS Take 10 mg by mouth every 4 (four) hours as needed (pain). 02/07/22  Yes [provider]  OZEMPIC, 2 MG/DOSE, 8 MG/3ML SOPN Inject 2 mg into the skin once a week. Wednesday 02/07/22  Yes [provider]  pregabalin (LYRICA) 100 MG capsule Take 100 mg by mouth daily as needed. 02/03/22  Yes [provider]  tirzepatide Greggory Keen(MOUNJARO) 5 MG/0.5ML Pen Inject 5 mg into the skin every Tuesday. At noon   Yes [provider]  acetaminophen (TYLENOL) 500 MG tablet Take 2 tablets (1,000 mg total) by mouth every 8 (eight) hours as needed. Patient not taking: Reported on 12/10/2021 10/20/21   Maretta BeesGhimire, Shanker M, MD  aspirin 81 MG chewable tablet Chew 1 tablet (81 mg total) by mouth daily. Patient not taking: Reported on 12/10/2021 10/20/21   Maretta BeesGhimire, Shanker M, MD  DULoxetine (CYMBALTA) 30 MG capsule Take 1 capsule (30 mg total) by mouth daily. Patient not taking: Reported on 02/16/2022 10/20/21   Maretta BeesGhimire, Shanker M, MD  HYDROmorphone (DILAUDID) 2 MG tablet Take 1 tablet (2 mg total) by mouth every 4 (four) hours as needed for severe pain. 10/26/21   Dione BoozeGlick, David, MD  insulin aspart protamine - aspart (NOVOLOG MIX 70/30 FLEXPEN) (70-30) 100 UNIT/ML FlexPen Inject 7 units twice daily as directed. Patient not taking: Reported on 02/16/2022 12/15/21   Lanae BoastKc, Ramesh, MD  Insulin Pen Needle 32G X 4 MM MISC Use to inject insulin up to 4 times daily as needed. 10/20/21   Ghimire, Werner LeanShanker M, MD  loratadine (CLARITIN) 10 MG tablet Take 1 tablet (10 mg total) by mouth daily for 14 days. Patient not taking: Reported on 02/16/2022 12/15/21 12/29/21  Lanae BoastKc, Ramesh, MD  menthol-cetylpyridinium (CEPACOL) 3 MG lozenge Take 1  lozenge (3 mg total) by mouth as needed for up to 30 doses for sore throat. Patient not taking: Reported on 02/16/2022 12/15/21   Lanae BoastKc, Ramesh, MD  pantoprazole (PROTONIX) 40 MG tablet Take 1 tablet (40 mg total) by mouth daily. Patient not taking: Reported on 02/16/2022 10/20/21   Maretta BeesGhimire, Shanker M, MD  polyethylene glycol powder (GLYCOLAX/MIRALAX) 17 GM/SCOOP powder Take 1 capful (17 g) by mouth 2 (two) times daily. Patient not taking: Reported on 12/10/2021 10/20/21   Maretta BeesGhimire, Shanker M, MD  senna-docusate (SENOKOT-S) 8.6-50 MG tablet Take 2 tablets by mouth 2 (two) times daily. Patient not taking: Reported on 12/10/2021 10/20/21   Maretta BeesGhimire, Shanker M, MD  tiZANidine (ZANAFLEX) 4 MG tablet Take 4 mg by mouth every 8 (eight) hours as needed. 01/25/22   [provider]  Past Surgical History Past Surgical History:  Procedure Laterality Date   AMPUTATION Left 02/04/2021   Procedure: LEFT GREAT TOE AMPUTATION;  Surgeon: [name], MD;  Location[facility] OR;  Service: Orthopedics;  Laterality: Left;   AMPUTATION Left 02/10/2021   Procedure: LEFT FOOT 1ST RAY AMPUTATION;  Surgeon: [name], MD;  Location: [facility] OR;  Service: Orthopedics;  Laterality: Left;   AMPUTATION Left 03/22/2021   Procedure: LEFT BELOW KNEE AMPUTATION;  Surgeon: [name], MD;  Location: [facility] OR;  Service: Orthopedics;  Laterality: Left;   APPENDECTOMY     BACK SURGERY     I & D EXTREMITY Left 03/03/2021   Procedure: LEFT FOOT DEBRIDEMENT;  Surgeon: [name], MD;  Location: [facility] OR;  Service: Orthopedics;  Laterality: Left;   INCISION AND DRAINAGE ABSCESS N/A 10/06/2021   Procedure: INCISION AND DRAINAGE BACK ABSCESS;  Surgeon: Berna Bue, MD;  Location: [facility] OR;  Service: General;  Laterality: N/A;   removal of back cyst     TONSILLECTOMY     Family History Family History   Problem Relation Age of Onset   Heart attack Neg Hx     Social History Social History   Tobacco Use   Smoking status: Never   Smokeless tobacco: Never  Substance Use Topics   Alcohol use: Not Currently   Drug use: Not Currently   Allergies Patient has no known allergies.  Review of Systems Review of Systems  Constitutional:  Positive for fatigue and fever.  Respiratory:  Positive for cough.   Gastrointestinal:  Positive for nausea and vomiting.   Physical Exam Vital Signs  I have reviewed the triage vital signs BP (!) 194/130   Pulse 93   Temp 99 F (37.2 C) (Oral)   Resp 17   Ht  (1.88 m)   Wt 95.3 kg   SpO2 97%   BMI 26.96 kg/m   Physical Exam Constitutional:      General: He is not in acute distress.    Appearance: Normal appearance. He is ill-appearing.  HENT:     Head: Normocephalic and atraumatic.     Nose: No congestion or rhinorrhea.  Eyes:     General:        Right eye: No discharge.        Left eye: No discharge.     Extraocular Movements: Extraocular movements intact.     Pupils: Pupils are equal, round, and reactive to light.  Cardiovascular:     Rate and Rhythm: Regular rhythm. Tachycardia present.     Heart sounds: No murmur heard. Pulmonary:     Effort: No respiratory distress.     Breath sounds: Rhonchi present. No wheezing or rales.  Abdominal:     General: There is no distension.     Tenderness: There is no abdominal tenderness.  Musculoskeletal:        General: Normal range of motion.     Cervical back: Normal range of motion.  Skin:    General: Skin is warm and dry.  Neurological:     General: No focal deficit present.     Mental Status: He is alert.    ED Results and Treatments Labs (all labs ordered are listed, but only abnormal results are displayed) Labs Reviewed  COMPREHENSIVE METABOLIC PANEL - Abnormal; Notable for the following components:      Result Value   Sodium 133 (*)    Chloride 95 (*)    Glucose,  Bld 437 (*)    BUN 23 (*)    Creatinine, Ser 1.50 (*)    GFR, Estimated 54 (*)    All other components within normal limits  GROUP A STREP BY PCR  CULTURE, BLOOD (ROUTINE X 2)  CULTURE, BLOOD (ROUTINE X 2)  URINE CULTURE  EXPECTORATED SPUTUM ASSESSMENT W GRAM STAIN, RFLX TO RESP C  LIPASE, BLOOD  CBC  LACTIC ACID, PLASMA  PROTIME-INR  APTT  MAGNESIUM  PHOSPHORUS  URINALYSIS, ROUTINE W REFLEX MICROSCOPIC  LACTIC ACID, PLASMA  STREP PNEUMONIAE URINARY ANTIGEN  HIV ANTIBODY (ROUTINE TESTING W REFLEX)  CBC  COMPREHENSIVE METABOLIC PANEL                                                                                                                          Radiology DG Chest 2 View  Result Date: 02/16/2022 CLINICAL DATA:  Cough for 1 week.  Recent pneumonia. EXAM: CHEST - 2 VIEW COMPARISON:  12/13/2021 FINDINGS: The heart size and mediastinal contours are within normal limits. Mild infiltrate is seen in the right middle lobe, suspicious for pneumonia. Left lung is clear. No evidence of pleural effusion. IMPRESSION: Mild right middle lobe infiltrate, suspicious for pneumonia. Recommend continued chest radiographic follow-up to confirm resolution. Electronically Signed   By: Danae Orleans M.D.   On: 02/16/2022 12:13    Pertinent labs & imaging results that were available during my care of the patient were reviewed by me and considered in my medical decision making (see MDM for details).  Medications Ordered in ED Medications  enoxaparin (LOVENOX) injection 40 mg (40 mg Subcutaneous Given 02/16/22 1414)  acetaminophen (TYLENOL) tablet 650 mg (has no administration in time range)    Or  acetaminophen (TYLENOL) suppository 650 mg (has no administration in time range)  ondansetron (ZOFRAN) tablet 4 mg (has no administration in time range)    Or  ondansetron (ZOFRAN) injection 4 mg (has no administration in time range)  albuterol (PROVENTIL) (2.5 MG/3ML) 0.083% nebulizer solution 2.5 mg  (has no administration in time range)  cefTRIAXone (ROCEPHIN) 2 g in sodium chloride 0.9 % 100 mL IVPB (has no administration in time range)  azithromycin (ZITHROMAX) 500 mg in sodium chloride 0.9 % 250 mL IVPB (0 mg Intravenous Stopped 02/16/22 1533)  ipratropium-albuterol (DUONEB) 0.5-2.5 (3) MG/3ML nebulizer solution 3 mL (has no administration in time range)  benzonatate (TESSALON) capsule 200 mg (has no administration in time range)  cefTRIAXone (ROCEPHIN) 1 g in sodium chloride 0.9 % 100 mL IVPB (0 g Intravenous Stopped 02/16/22 1412)  prochlorperazine (COMPAZINE) injection 10 mg (10 mg Intravenous Given 02/16/22 1304)  diphenhydrAMINE (BENADRYL) injection 25 mg (25 mg Intravenous Given 02/16/22 1303)  lactated ringers bolus 1,000 mL (0 mLs Intravenous Stopped 02/16/22 1533)  insulin aspart (novoLOG) injection 12 Units (12 Units Subcutaneous Given 02/16/22 1418)  metoprolol tartrate (LOPRESSOR) injection 5 mg (5 mg Intravenous Given 02/16/22 1412)  HYDROmorphone (DILAUDID) injection 1 mg (1 mg Intravenous Given  02/16/22 1528)  ketorolac (TORADOL) 30 MG/ML injection 30 mg (30 mg Intravenous Given 02/16/22 1528)  lactated ringers bolus 1,000 mL (1,000 mLs Intravenous New Bag/Given 02/16/22 1532)                                                                                                                                     Procedures .Critical Care Performed by: Glendora Score, MD Authorized by: Glendora Score, MD   Critical care provider statement:    Critical care time (minutes):  30   Critical care was necessary to treat or prevent imminent or life-threatening deterioration of the following conditions:  Sepsis   Critical care was time spent personally by me on the following activities:  Development of treatment plan with patient or surrogate, discussions with consultants, evaluation of patient's response to treatment, examination of patient, ordering and review of laboratory studies,  ordering and review of radiographic studies, ordering and performing treatments and interventions, pulse oximetry, re-evaluation of patient's condition and review of old charts  (including critical care time)  Medical Decision Making / ED Course   This patient presents to the ED for concern of cough, emesis, fever, headache, this involves an extensive number of treatment options, and is a complaint that carries with it a high risk of complications and morbidity.  The differential diagnosis includes pneumonia, sepsis, electrolyte abnormality, intra-abdominal infection, viral URI  MDM: Seen in the emergency department for multiple complaints as described above.  Physical exam reveals rhonchi on the right as well as a regular tachycardia and patient warm to the touch.  Laboratory evaluation with a creatinine of 1.5, BUN 23, hyponatremia 133.  Chest x-ray concerning for right-sided pneumonia.  Patient meets SIRS criteria on arrival and thus sepsis work-up initiated.  Fluid resuscitation began and ceftriaxone initiated.  Given for headache.  Patient then admitted for pneumonia.   Additional history obtained:  -External records from outside source obtained and reviewed including: Chart review including previous notes, labs, imaging, consultation notes   Lab Tests: -I ordered, reviewed, and interpreted labs.   The pertinent results include:   Labs Reviewed  COMPREHENSIVE METABOLIC PANEL - Abnormal; Notable for the following components:      Result Value   Sodium 133 (*)    Chloride 95 (*)    Glucose, Bld 437 (*)    BUN 23 (*)    Creatinine, Ser 1.50 (*)    GFR, Estimated 54 (*)    All other components within normal limits  GROUP A STREP BY PCR  CULTURE, BLOOD (ROUTINE X 2)  CULTURE, BLOOD (ROUTINE X 2)  URINE CULTURE  EXPECTORATED SPUTUM ASSESSMENT W GRAM STAIN, RFLX TO RESP C  LIPASE, BLOOD  CBC  LACTIC ACID, PLASMA  PROTIME-INR  APTT  MAGNESIUM  PHOSPHORUS  URINALYSIS, ROUTINE W  REFLEX MICROSCOPIC  LACTIC ACID, PLASMA  STREP PNEUMONIAE URINARY ANTIGEN  HIV ANTIBODY (ROUTINE TESTING W REFLEX)  CBC  COMPREHENSIVE METABOLIC PANEL      EKG   EKG Interpretation  Date/Time:  Friday Feb 16 2022 12:41:55 EDT Ventricular Rate:  112 PR Interval:  166 QRS Duration: 92 QT Interval:  321 QTC Calculation: 439 R Axis:   -36 Text Interpretation: Sinus tachycardia Confirmed by Emmalia Heyboer ([id number]) on 02/16/2022 5:07:51 PM         Imaging Studies ordered: I ordered imaging studies including CXR I independently visualized and interpreted imaging. I agree with the radiologist interpretation   Medicines ordered and prescription drug management: Meds ordered this encounter  Medications   cefTRIAXone (ROCEPHIN) 1 g in sodium chloride 0.9 % 100 mL IVPB    Order Specific Question:   Antibiotic Indication:    Answer:   CAP   prochlorperazine (COMPAZINE) injection 10 mg   diphenhydrAMINE (BENADRYL) injection 25 mg   lactated ringers bolus 1,000 mL   enoxaparin (LOVENOX) injection 40 mg   insulin aspart (novoLOG) injection 12 Units   OR Linked Order Group    acetaminophen (TYLENOL) tablet 650 mg    acetaminophen (TYLENOL) suppository 650 mg   OR Linked Order Group    ondansetron (ZOFRAN) tablet 4 mg    ondansetron (ZOFRAN) injection 4 mg   DISCONTD: ipratropium (ATROVENT) nebulizer solution 0.5 mg   DISCONTD: albuterol (PROVENTIL) (2.5 MG/3ML) 0.083% nebulizer solution 2.5 mg   albuterol (PROVENTIL) (2.5 MG/3ML) 0.083% nebulizer solution 2.5 mg   cefTRIAXone (ROCEPHIN) 2 g in sodium chloride 0.9 % 100 mL IVPB    Order Specific Question:   Antibiotic Indication:    Answer:   CAP   azithromycin (ZITHROMAX) 500 mg in sodium chloride 0.9 % 250 mL IVPB    Order Specific Question:   Antibiotic Indication:    Answer:   CAP   metoprolol tartrate (LOPRESSOR) injection 5 mg   ipratropium-albuterol (DUONEB) 0.5-2.5 (3) MG/3ML nebulizer solution 3 mL   HYDROmorphone  (DILAUDID) injection 1 mg   ketorolac (TORADOL) 30 MG/ML injection 30 mg   benzonatate (TESSALON) capsule 200 mg   lactated ringers bolus 1,000 mL    -I have reviewed the patients home medicines and have made adjustments as needed  Critical interventions Fluid resuscitation, antibiotics, sepsis work-up    Cardiac Monitoring: The patient was maintained on a cardiac monitor.  I personally viewed and interpreted the cardiac monitored which showed an underlying rhythm of: Sinus tachycardia  Social Determinants of Health:  Factors impacting patients care include: none   Reevaluation: After the interventions noted above, I reevaluated the patient and found that they have :improved  Co morbidities that complicate the patient evaluation  Past Medical History:  Diagnosis Date   GERD (gastroesophageal reflux disease)    HTN (hypertension)    Insulin dependent type 2 diabetes mellitus (HCC)    Neuropathy    Stroke (HCC)       Dispostion: I considered admission for this patient, and due to complicated pneumonia with concern for possible underlying sepsis patient was admitted     Final Clinical Impression(s) / ED Diagnoses Final diagnoses:  None     @    Glendora Score, MD 02/16/22 1708

## 2022-02-16 NOTE — H&P (Signed)
History and Physical    Patient: Brett White [id number] DOB: 1961/03/12 DOA: 02/16/2022 DOS: the patient was seen and examined on 02/16/2022 PCP: Arthur Holms, NP  Patient coming from: Home  Chief Complaint:  Chief Complaint  Patient presents with   Cough   Emesis   Sore Throat   HPI: Brett White is a 61 y.o. male with medical history significant of GERD, hypertension, insulin-dependent type 2 DM, diabetic peripheral neuropathy, all other nonhemorrhagic CVA, History of AKI, left foot osteomyelitis, history of left BKA who came to the emergency department with complaints of sore throat, dry cough, fever, chills, fatigue, malaise, decreased appetite and emesis similar to the symptoms he experienced when he had pneumonia.  He also complains of pleuritic chest wall pain.  No precordial chest pain, palpitations, diaphoresis, PND, orthopnea or pitting edema lower extremities.  No abdominal pain, diarrhea, constipation, melena hematochezia.  No flank pain, dysuria, frequency hematuria.  No polyuria, polydipsia, polyphagia or blurred vision.  ED course: Initial vital signs were temperature 99 F, pulse 120, respiration 18, BP 130/94 mmHg O2 sat 6% on room air.  The patient received ceftriaxone, azithromycin, 2000 mL of LR bolus and 10 mg of chlorproethazine IVP.  Lab work: CBC was normal.  Unremarkable PT, INR, PTT, magnesium, phosphorus, lactic acid and lipase.  He is urinalysis showed glucosuria more than 500, ketonuria 5 and proteinuria of 100 mg/dL.  Microscopic examination was unremarkable.  CMP showed normal electrolytes when sodium and chloride corrected.  Glucose 437, BUN 23 and creatinine 1.50 mg/dL.  His baseline creatinine is usually normal.  Imaging: Two-view chest radiograph showed mild right middle lobe infiltrate, suspicious for pneumonia.   Review of Systems: As mentioned in the history of present illness. All other systems reviewed and are negative. Past Medical History:   Diagnosis Date   GERD (gastroesophageal reflux disease)    HTN (hypertension)    Insulin dependent type 2 diabetes mellitus ([place])    Neuropathy    Stroke [facility])    Past Surgical History:  Procedure Laterality Date   AMPUTATION Left 02/04/2021   Procedure: LEFT GREAT TOE AMPUTATION;  Surgeon: Newt Minion, MD;  Location: [place];  Service: Orthopedics;  Laterality: Left;   AMPUTATION Left 02/10/2021   Procedure: LEFT FOOT 1ST RAY AMPUTATION;  Surgeon: Newt Minion, MD;  Location: [place];  Service: Orthopedics;  Laterality: Left;   AMPUTATION Left 03/22/2021   Procedure: LEFT BELOW KNEE AMPUTATION;  Surgeon: Newt Minion, MD;  Location: [place];  Service: Orthopedics;  Laterality: Left;   APPENDECTOMY     BACK SURGERY     I & D EXTREMITY Left 03/03/2021   Procedure: LEFT FOOT DEBRIDEMENT;  Surgeon: Newt Minion, MD;  Location: [place];  Service: Orthopedics;  Laterality: Left;   INCISION AND DRAINAGE ABSCESS N/A 10/06/2021   Procedure: INCISION AND DRAINAGE BACK ABSCESS;  Surgeon: Clovis Riley, MD;  Location: [place];  Service: General;  Laterality: N/A;   removal of back cyst     TONSILLECTOMY     Social History:  reports that he has never smoked. He has never used smokeless tobacco. He reports that he does not currently use alcohol. He reports that he does not currently use drugs.  No Known Allergies  Family History  Problem Relation Age of Onset   Heart attack Neg Hx     Prior to Admission medications   Medication Sig Start Date End Date Taking? Authorizing Provider  acetaminophen (TYLENOL) 500  MG tablet Take 2 tablets (1,000 mg total) by mouth every 8 (eight) hours as needed. Patient not taking: Reported on 12/10/2021 10/20/21   Jonetta Osgood, MD  albuterol (VENTOLIN HFA) 108 (90 Base) MCG/ACT inhaler Inhale 2 puffs into the lungs every 6 (six) hours as needed for wheezing or shortness of breath. 12/15/21   Antonieta Pert, MD  amLODipine (NORVASC) 5 MG tablet Take 1 tablet (5  mg total) by mouth daily. Patient not taking: Reported on 12/10/2021 10/20/21   Jonetta Osgood, MD  aspirin 81 MG chewable tablet Chew 1 tablet (81 mg total) by mouth daily. Patient not taking: Reported on 12/10/2021 10/20/21   Jonetta Osgood, MD  atorvastatin (LIPITOR) 20 MG tablet Take 20 mg by mouth at bedtime. 11/07/21   [provider]  cholecalciferol (VITAMIN D3) 25 MCG (1000 UNIT) tablet Take 1,000 Units by mouth daily. 11/07/21   [provider]  DULoxetine (CYMBALTA) 30 MG capsule Take 1 capsule (30 mg total) by mouth daily. Patient taking differently: Take 30 mg by mouth 2 (two) times daily. 10/20/21   Ghimire, Henreitta Leber, MD  fluticasone (FLONASE) 50 MCG/ACT nasal spray Place 1 spray into both nostrils daily. 12/15/21   Antonieta Pert, MD  fluticasone-salmeterol (ADVAIR HFA) [id number] MCG/ACT inhaler Inhale 2 puffs into the lungs 2 (two) times daily. 12/15/21   Gerald Leitz D, NP  furosemide (LASIX) 20 MG tablet Take 20 mg by mouth every morning. 11/07/21   [provider]  gabapentin (NEURONTIN) 300 MG capsule Take 300 mg by mouth 2 (two) times daily. 12/04/21   [provider]  glimepiride (AMARYL) 4 MG tablet Take 8 mg by mouth daily. 11/07/21   [provider]  HYDROmorphone (DILAUDID) 2 MG tablet Take 1 tablet (2 mg total) by mouth every 4 (four) hours as needed for severe pain. Patient not taking: Reported on [id number]   Delora Fuel, MD  insulin aspart protamine - aspart (NOVOLOG MIX 70/30 FLEXPEN) (70-30) 100 UNIT/ML FlexPen Inject 7 units twice daily as directed. 12/15/21   Antonieta Pert, MD  Insulin Pen Needle 32G X 4 MM MISC Use to inject insulin up to 4 times daily as needed. 10/20/21   Ghimire, Henreitta Leber, MD  loratadine (CLARITIN) 10 MG tablet Take 1 tablet (10 mg total) by mouth daily for 14 days. 12/15/21 12/29/21  Antonieta Pert, MD  losartan (COZAAR) 50 MG tablet Take 50 mg by mouth daily. 11/07/21   [provider]   menthol-cetylpyridinium (CEPACOL) 3 MG lozenge Take 1 lozenge (3 mg total) by mouth as needed for up to 30 doses for sore throat. 12/15/21   Antonieta Pert, MD  metFORMIN (GLUCOPHAGE) 1000 MG tablet Take 1,000 mg by mouth 2 (two) times daily with a meal.    [provider]  metoprolol tartrate (LOPRESSOR) 25 MG tablet Take 1/2 tablet (12.5 mg total) by mouth 2 (two) times daily. Patient not taking: Reported on 12/10/2021 10/20/21   Jonetta Osgood, MD  naproxen (NAPROSYN) 500 MG tablet Take 1 tablet (500 mg total) by mouth 2 (two) times daily. Patient not taking: Reported on [phone] [id number]   Delora Fuel, MD  ondansetron (ZOFRAN) 4 MG tablet Take 1 tablet (4 mg total) by mouth every 6 (six) hours as needed for nausea. Patient not taking: Reported on [phone] [id number]   Delora Fuel, MD  pantoprazole (PROTONIX) 40 MG tablet Take 1 tablet (40 mg total) by mouth daily. 10/20/21   Ghimire, Henreitta Leber, MD  polyethylene glycol powder (GLYCOLAX/MIRALAX) 17 GM/SCOOP powder Take 1 capful (17 g) by mouth 2 (two) times daily. Patient not taking: Reported on 12/10/2021 10/20/21   Maretta Bees, MD  senna-docusate (SENOKOT-S) 8.6-50 MG tablet Take 2 tablets by mouth 2 (two) times daily. Patient not taking: Reported on 12/10/2021 10/20/21   Maretta Bees, MD  tirzepatide [facility]) 5 MG/0.5ML Pen Inject 5 mg into the skin every Tuesday. At noon    [provider]    Physical Exam: Vitals:   02/16/22 1148 02/16/22 1154 02/16/22 1309  BP: (!) 130/94  (!) 155/105  Pulse: (!) 120  (!) 117  Resp: 18  17  Temp: 99 F (37.2 C)    TempSrc: Oral    SpO2: 96%  97%  Weight:  95.3 kg   Height:  6\' 2"  (1.88 m)    Physical Exam Vitals and nursing note reviewed.  Constitutional:      Appearance: He is well-developed. He is obese.  HENT:     Head: Normocephalic.     Mouth/Throat:     Pharynx: Pharyngeal swelling and posterior oropharyngeal erythema present.  Eyes:     General: No  scleral icterus.    Pupils: Pupils are equal, round, and reactive to light.  Neck:     Vascular: No JVD.  Cardiovascular:     Rate and Rhythm: Normal rate and regular rhythm.     Heart sounds: S1 normal and S2 normal.  Pulmonary:     Breath sounds: Wheezing and rales present.  Abdominal:     General: Bowel sounds are normal. There is no distension.     Palpations: Abdomen is soft.     Tenderness: There is no abdominal tenderness.  Musculoskeletal:     Cervical back: Neck supple.     Right lower leg: No edema.     Left lower leg: No edema.     Left Lower Extremity: Left leg is amputated below knee.  Skin:    General: Skin is warm and dry.  Neurological:     General: No focal deficit present.     Mental Status: He is alert and oriented to person, place, and time.  Psychiatric:        Mood and Affect: Mood normal.        Behavior: Behavior normal.    Data Reviewed:  There are no new results to review at this time.  Assessment and Plan: Principal Problem:   CAP (community acquired pneumonia) Admit to telemetry/inpatient. Continue supplemental oxygen. As needed bronchodilators. Continue ceftriaxone 1 g IVPB daily. Continue azithromycin 500 mg IVPB daily. Check strep pneumoniae urinary antigen. Check sputum Gram stain, culture and sensitivity. Follow-up blood culture and sensitivity. Follow-up CBC and chemistry in the morning.  Active Problems:   AKI (acute kidney injury) (HCC) Continue IV fluids. Avoid hypotension. Avoid nephrotoxins. Hold daily losartan. Monitor intake and output. Monitor renal function electrolytes.    Uncontrolled type 2 diabetes mellitus with hyperglycemia (HCC) Most recent A1c 11.3%. Carbohydrate modified diet. Continue glipizide 5 mg p.o. daily. Continue metformin 1000 mg p.o. twice daily. Continue Farxiga 10 mg p.o. daily. Continue IV fluids. Hold weekly injections. CBG monitoring with RI SS. Continue atorvastatin for  hyperlipidemia.    Diabetic peripheral neuropathy  associated with type 2 diabetes mellitus (HCC) Continue gabapentin 300 mg p.o. 3 times daily. Continue duloxetine 60 mg p.o. twice daily.    Essential hypertension Resume amlodipine 5 mg p.o. daily. Hold losartan as above. Continue metoprolol 12.5  mg p.o. twice daily. Labetalol as needed for SBP higher than 169 mg/dL.    Advance Care Planning:   Code Status: Full Code   Consults:   Family Communication:   Severity of Illness: The appropriate patient status for this patient is INPATIENT. Inpatient status is judged to be reasonable and necessary in order to provide the required intensity of service to ensure the patient's safety. The patient's presenting symptoms, physical exam findings, and initial radiographic and laboratory data in the context of their chronic comorbidities is felt to place them at high risk for further clinical deterioration. Furthermore, it is not anticipated that the patient will be medically stable for discharge from the hospital within 2 midnights of admission.   * I certify that at the point of admission it is my clinical judgment that the patient will require inpatient hospital care spanning beyond 2 midnights from the point of admission due to high intensity of service, high risk for further deterioration and high frequency of surveillance required.*  Author: Reubin Milan, MD 02/16/2022 1:54 PM  For on call review [email or web address].   This document was prepared in [employer] recognition software and may contain some unintended transcription errors.

## 2022-02-17 DIAGNOSIS — J189 Pneumonia, unspecified organism: Secondary | ICD-10-CM | POA: Diagnosis not present

## 2022-02-17 DIAGNOSIS — N179 Acute kidney failure, unspecified: Secondary | ICD-10-CM | POA: Diagnosis not present

## 2022-02-17 DIAGNOSIS — I10 Essential (primary) hypertension: Secondary | ICD-10-CM | POA: Diagnosis not present

## 2022-02-17 DIAGNOSIS — E1142 Type 2 diabetes mellitus with diabetic polyneuropathy: Secondary | ICD-10-CM | POA: Diagnosis not present

## 2022-02-17 LAB — CBC
HCT: 40.8 % (ref 39.0–52.0)
Hemoglobin: 13 g/dL (ref 13.0–17.0)
MCH: 27.8 pg (ref 26.0–34.0)
MCHC: 31.9 g/dL (ref 30.0–36.0)
MCV: 87.2 fL (ref 80.0–100.0)
Platelets: 227 10*3/uL (ref 150–400)
RBC: 4.68 MIL/uL (ref 4.22–5.81)
RDW: 13.2 % (ref 11.5–15.5)
WBC: 10.1 10*3/uL (ref 4.0–10.5)
nRBC: 0 % (ref 0.0–0.2)

## 2022-02-17 LAB — COMPREHENSIVE METABOLIC PANEL
ALT: 14 U/L (ref 0–44)
AST: 18 U/L (ref 15–41)
Albumin: 3.5 g/dL (ref 3.5–5.0)
Alkaline Phosphatase: 99 U/L (ref 38–126)
Anion gap: 11 (ref 5–15)
BUN: 27 mg/dL — ABNORMAL HIGH (ref 6–20)
CO2: 24 mmol/L (ref 22–32)
Calcium: 8.5 mg/dL — ABNORMAL LOW (ref 8.9–10.3)
Chloride: 99 mmol/L (ref 98–111)
Creatinine, Ser: 1.58 mg/dL — ABNORMAL HIGH (ref 0.61–1.24)
GFR, Estimated: 50 mL/min — ABNORMAL LOW (ref >60)
Glucose, Bld: 316 mg/dL — ABNORMAL HIGH (ref 70–99)
Potassium: 4.6 mmol/L (ref 3.5–5.1)
Sodium: 134 mmol/L — ABNORMAL LOW (ref 135–145)
Total Bilirubin: 0.8 mg/dL (ref 0.3–1.2)
Total Protein: 7.3 g/dL (ref 6.5–8.1)

## 2022-02-17 LAB — GLUCOSE, CAPILLARY
Glucose-Capillary: 289 mg/dL — ABNORMAL HIGH (ref 70–99)
Glucose-Capillary: 297 mg/dL — ABNORMAL HIGH (ref 70–99)
Glucose-Capillary: 129 mg/dL — ABNORMAL HIGH (ref 70–99)

## 2022-02-17 LAB — HIV ANTIBODY (ROUTINE TESTING W REFLEX): HIV Screen 4th Generation wRfx: NONREACTIVE

## 2022-02-17 MED ORDER — SODIUM CHLORIDE 0.9 % IV BOLUS
500.0000 mL | Freq: Once | INTRAVENOUS | Status: AC
  Administered 2022-02-17: 500 mL via INTRAVENOUS

## 2022-02-17 MED ORDER — INSULIN ASPART 100 UNIT/ML IJ SOLN: 3.0000 [IU] | Freq: Three times a day (TID) | INTRAMUSCULAR | Status: DC

## 2022-02-17 MED ORDER — OXYCODONE-ACETAMINOPHEN 7.5-325 MG PO TABS
2.0000 | ORAL_TABLET | ORAL | Status: DC | PRN
  Administered 2022-02-17 – 2022-02-18 (×2): 2 via ORAL
  Filled 2022-02-17 (×3): qty 2

## 2022-02-17 MED ORDER — SODIUM CHLORIDE 0.9 % IV SOLN: INTRAVENOUS | Status: AC

## 2022-02-17 MED ORDER — GABAPENTIN 300 MG PO CAPS
300.0000 mg | ORAL_CAPSULE | Freq: Two times a day (BID) | ORAL | Status: DC
Start: 2022-02-18 — End: 2022-02-19
  Administered 2022-02-18 – 2022-02-19 (×3): 300 mg via ORAL
  Filled 2022-02-17 (×3): qty 1

## 2022-02-17 MED ORDER — INSULIN ASPART 100 UNIT/ML IJ SOLN: 0.0000 [IU] | Freq: Every day | INTRAMUSCULAR | Status: DC

## 2022-02-17 MED ORDER — INSULIN ASPART 100 UNIT/ML IJ SOLN
3.0000 [IU] | Freq: Three times a day (TID) | INTRAMUSCULAR | Status: DC
  Administered 2022-02-17 – 2022-02-19 (×7): 3 [IU] via SUBCUTANEOUS

## 2022-02-17 MED ORDER — INSULIN ASPART 100 UNIT/ML IJ SOLN
0.0000 [IU] | Freq: Three times a day (TID) | INTRAMUSCULAR | Status: DC
  Administered 2022-02-17 (×3): 5 [IU] via SUBCUTANEOUS
  Administered 2022-02-18 (×3): 2 [IU] via SUBCUTANEOUS
  Administered 2022-02-19: 1 [IU] via SUBCUTANEOUS

## 2022-02-17 NOTE — Plan of Care (Signed)
Pt admitted to unit this shift. Pt oriented to unit and room. Pt ambulates with walker and prosthetic leg. Problem: Activity: Goal: Ability to tolerate increased activity will improve Outcome: Progressing   Problem: Respiratory: Goal: Ability to maintain adequate ventilation will improve Outcome: Progressing Goal: Ability to maintain a clear airway will improve Outcome: Progressing   Problem: Education: Goal: Knowledge of General Education information will improve Description: Including pain rating scale, medication(s)/side effects and non-pharmacologic comfort measures Outcome: Progressing   Problem: Health Behavior/Discharge Planning: Goal: Ability to manage health-related needs will improve Outcome: Progressing   Problem: Clinical Measurements: Goal: Ability to maintain clinical measurements within normal limits will improve Outcome: Progressing Goal: Will remain free from infection Outcome: Progressing Goal: Diagnostic test results will improve Outcome: Progressing Goal: Respiratory complications will improve Outcome: Progressing Goal: Cardiovascular complication will be avoided Outcome: Progressing   Problem: Activity: Goal: Risk for activity intolerance will decrease Outcome: Progressing   Problem: Nutrition: Goal: Adequate nutrition will be maintained Outcome: Progressing   Problem: Coping: Goal: Level of anxiety will decrease Outcome: Progressing   Problem: Elimination: Goal: Will not experience complications related to bowel motility Outcome: Progressing Goal: Will not experience complications related to urinary retention Outcome: Progressing   Problem: Pain Managment: Goal: General experience of comfort will improve Outcome: Progressing   Problem: Safety: Goal: Ability to remain free from injury will improve Outcome: Progressing   Problem: Skin Integrity: Goal: Risk for impaired skin integrity will decrease Outcome: Progressing

## 2022-02-17 NOTE — Progress Notes (Signed)
[facility] PROGRESS NOTE    Progress Note  Brett White  [id number] DOB: September 25, 1961 DOA: 02/16/2022 PCP: Arthur Holms, NP     Brief Narrative:   Brett White is an 61 y.o. male past medical history significant for GERD, insulin-dependent diabetes mellitus type 2, diabetic peripheral neuropathy, nonhemorrhagic CVA, left foot osteomyelitis, history of left BKA who came into the ED complaining of a sore throat, dry cough fever chills fatigue and decreased appetite, she is also been complaining of pleuritic chest pain.  In the ED was found to have a pulse of 120, chest x-ray showed mild right lower lobe pneumonia start empirically on antibiotics and fluid resuscitated.    Assessment/Plan:   CAP (community acquired pneumonia): Continue empiric Rocephin and azithromycin. Strep pneumo and urine antigen were negative. Blood cultures have been sent. Tmax of 100.4.  Acute kidney injury: With a baseline creatinine of 0.8 back in March of this year on admission 1.5. The setting of  ACE inhibitor and NSAID use We will continue fluid resuscitation. Recheck a basic metabolic panel tomorrow morning. Hold ARB.  Uncontrolled diabetes mellitus with hyperglycemia: With a last A1c of 11. Hold metformin, discontinue glipizide in the setting of acute kidney injury. Start on sliding scale insulin with meal coverage CBGs before meals and at bedtime. Blood glucose slowly improving.  Diabetic peripheral neuropathy: Continue duloxetine and Neurontin. With Neurontin in the setting of acute kidney injury.  Essential hypertension: Resume [place]ine hold ACE inhibitor continue metoprolol. Blood pressure is mildly elevated heart rate is improved, continue current regimen.    DVT prophylaxis: lovenox Family Communication:none Status is: Inpatient Remains inpatient appropriate because: Pneumonia    Code Status:     Code Status Orders  (From admission, onward)           Start      Ordered   02/16/22 1351  Full code  Continuous        02/16/22 1354           Code Status History     Date Active Date Inactive Code Status Order ID Comments User Context   12/09/2021 2303 12/16/2021 0718 Full Code [id number]  Chotiner, Yevonne Aline, MD Inpatient   10/04/2021 0107 10/20/2021 2231 Full Code [id number]  Vianne Bulls, MD ED   03/17/2021 0422 03/29/2021 2310 Full Code [id number]  Kayleen Memos, DO ED   03/03/2021 1839 03/15/2021 2153 Full Code [id number]  Persons, Bevely Palmer, [place] Inpatient   02/03/2021 0159 02/19/2021 1858 Full Code [id number]  Shalhoub, Sherryll Burger, MD ED         IV Access:   Peripheral IV   Procedures and diagnostic studies:   DG Chest 2 View  Result Date: 02/16/2022 CLINICAL DATA:  Cough for 1 week.  Recent pneumonia. EXAM: CHEST - 2 VIEW COMPARISON:  12/13/2021 FINDINGS: The heart size and mediastinal contours are within normal limits. Mild infiltrate is seen in the right middle lobe, suspicious for pneumonia. Left lung is clear. No evidence of pleural effusion. IMPRESSION: Mild right middle lobe infiltrate, suspicious for pneumonia. Recommend continued chest radiographic follow-up to confirm resolution. Electronically Signed   By: Marlaine Hind M.D.   On: 02/16/2022 12:13     Medical Consultants:   None.   Subjective:    Brett White relates he still feels bad and nauseated and with a bad headache.  Objective:    Vitals:   02/16/22 2045 02/17/22 0105 02/17/22 0500 02/17/22 0555  BP: (!) 175/108 (!) 158/88 (!) 148/92  Pulse: 95 100 (!) 113 (!) 106  Resp: 16 18 20 15   Temp: 98.9 F (37.2 C) (!) 100.4 F (38 C) 98.2 F (36.8 C)   TempSrc: Oral Oral Oral   SpO2: 98% 91% 93%   Weight:      Height:       SpO2: 93 % O2 Flow Rate (L/min): 2 L/min   Intake/Output Summary (Last 24 hours) at 02/17/2022 0717 Last data filed at 02/17/2022 0600 Gross per 24 hour  Intake --  Output 500 ml  Net -500 ml   Filed Weights   02/16/22 1154  Weight:  95.3 kg    Exam: General exam: In no acute distress. Respiratory system: Good air movement and crackles bilaterally Cardiovascular system: S1 & S2 heard, RRR. No JVD. Gastrointestinal system: Abdomen is nondistended, soft and nontender.  Extremities: No pedal edema. Skin: No rashes, lesions or ulcers Psychiatry: Judgement and insight appear normal. Mood & affect appropriate.    Data Reviewed:    Labs: Basic Metabolic Panel: Recent Labs  Lab 02/16/22 1230 02/17/22 0544  NA 133* 134*  K 4.5 4.6  CL 95* 99  CO2 25 24  GLUCOSE 437* 316*  BUN 23* 27*  CREATININE 1.50* 1.58*  CALCIUM 9.2 8.5*  MG 2.1  --   PHOS 3.6  --    GFR Estimated Creatinine Clearance: 59.3 mL/min (A) (by C-G formula based on SCr of 1.58 mg/dL (H)). Liver Function Tests: Recent Labs  Lab 02/16/22 1230 02/17/22 0544  AST 20 18  ALT 16 14  ALKPHOS 116 99  BILITOT 1.1 0.8  PROT 8.1 7.3  ALBUMIN 4.0 3.5   Recent Labs  Lab 02/16/22 1230  LIPASE 20   No results for input(s): AMMONIA in the last 168 hours. Coagulation profile Recent Labs  Lab 02/16/22 1255  INR 1.0   COVID-19 Labs  No results for input(s): DDIMER, FERRITIN, LDH, CRP in the last 72 hours.  Lab Results  Component Value Date   SARSCOV2NAA NEGATIVE 12/09/2021   SARSCOV2NAA NEGATIVE 10/19/2021   [place] NEGATIVE 10/04/2021   [place] NEGATIVE 06/29/2021    CBC: Recent Labs  Lab 02/16/22 1230 02/17/22 0544  WBC 7.0 10.1  HGB 15.0 13.0  HCT 44.6 40.8  MCV 84.2 87.2  PLT 249 227   Cardiac Enzymes: No results for input(s): CKTOTAL, CKMB, CKMBINDEX, TROPONINI in the last 168 hours. BNP (last 3 results) No results for input(s): PROBNP in the last 8760 hours. CBG: Recent Labs  Lab 02/16/22 2006 02/16/22 2048  GLUCAP 251* 221*   D-Dimer: No results for input(s): DDIMER in the last 72 hours. Hgb A1c: No results for input(s): HGBA1C in the last 72 hours. Lipid Profile: No results for input(s): CHOL,  HDL, LDLCALC, TRIG, CHOLHDL, LDLDIRECT in the last 72 hours. Thyroid function studies: No results for input(s): TSH, T4TOTAL, T3FREE, THYROIDAB in the last 72 hours.  Invalid input(s): FREET3 Anemia work up: No results for input(s): VITAMINB12, FOLATE, FERRITIN, TIBC, IRON, RETICCTPCT in the last 72 hours. Sepsis Labs: Recent Labs  Lab 02/16/22 1230 02/16/22 1255 02/16/22 1842 02/16/22 2240 02/17/22 0544  WBC 7.0  --   --   --  10.1  LATICACIDVEN  --  1.8 2.4* 1.6  --    Microbiology Recent Results (from the past 240 hour(s))  Group A Strep by PCR     Status: None   Collection Time: 02/16/22 12:00 PM   Specimen: Throat; Sterile Swab  Result Value Ref Range Status  Group A Strep by PCR NOT DETECTED NOT DETECTED Final    Comment: Performed at [facility], [place] [address], [place], [place] [address]     Medications:    amLODipine  5 mg Oral Daily   atorvastatin  20 mg Oral QHS   dapagliflozin propanediol  10 mg Oral q morning   DULoxetine  60 mg Oral BID   enoxaparin (LOVENOX) injection  40 mg Subcutaneous Q24H   gabapentin  300 mg Oral BID   glipiZIDE  5 mg Oral q morning   insulin aspart  0-15 Units Subcutaneous TID WC   ipratropium-albuterol  3 mL Nebulization TID   metFORMIN  1,000 mg Oral BID WC   metoprolol tartrate  12.5 mg Oral BID   mometasone-formoterol  2 puff Inhalation BID   pantoprazole  40 mg Oral Daily   Continuous Infusions:  azithromycin Stopped (02/16/22 1533)   cefTRIAXone (ROCEPHIN)  IV        LOS: 1 day   Charlynne Cousins  [facility]  02/17/2022, 7:17 AM

## 2022-02-18 DIAGNOSIS — J189 Pneumonia, unspecified organism: Secondary | ICD-10-CM | POA: Diagnosis not present

## 2022-02-18 DIAGNOSIS — E1142 Type 2 diabetes mellitus with diabetic polyneuropathy: Secondary | ICD-10-CM | POA: Diagnosis not present

## 2022-02-18 DIAGNOSIS — N179 Acute kidney failure, unspecified: Secondary | ICD-10-CM | POA: Diagnosis not present

## 2022-02-18 DIAGNOSIS — I10 Essential (primary) hypertension: Secondary | ICD-10-CM | POA: Diagnosis not present

## 2022-02-18 LAB — GLUCOSE, CAPILLARY
Glucose-Capillary: 196 mg/dL — ABNORMAL HIGH (ref 70–99)
Glucose-Capillary: 182 mg/dL — ABNORMAL HIGH (ref 70–99)
Glucose-Capillary: 164 mg/dL — ABNORMAL HIGH (ref 70–99)
Glucose-Capillary: 148 mg/dL — ABNORMAL HIGH (ref 70–99)

## 2022-02-18 LAB — BASIC METABOLIC PANEL
Anion gap: 10 (ref 5–15)
BUN: 31 mg/dL — ABNORMAL HIGH (ref 6–20)
CO2: 24 mmol/L (ref 22–32)
Calcium: 8.1 mg/dL — ABNORMAL LOW (ref 8.9–10.3)
Chloride: 100 mmol/L (ref 98–111)
Creatinine, Ser: 1.48 mg/dL — ABNORMAL HIGH (ref 0.61–1.24)
GFR, Estimated: 55 mL/min — ABNORMAL LOW (ref >60)
Glucose, Bld: 224 mg/dL — ABNORMAL HIGH (ref 70–99)
Potassium: 4.7 mmol/L (ref 3.5–5.1)
Sodium: 134 mmol/L — ABNORMAL LOW (ref 135–145)

## 2022-02-18 LAB — URINE CULTURE: Culture: NO GROWTH

## 2022-02-18 MED ORDER — OXYCODONE HCL 5 MG PO TABS
10.0000 mg | ORAL_TABLET | ORAL | Status: DC | PRN
  Administered 2022-02-18 – 2022-02-19 (×5): 10 mg via ORAL
  Filled 2022-02-18 (×5): qty 2

## 2022-02-18 MED ORDER — INSULIN DETEMIR 100 UNIT/ML ~~LOC~~ SOLN
5.0000 [IU] | Freq: Two times a day (BID) | SUBCUTANEOUS | Status: DC
  Administered 2022-02-18 (×2): 5 [IU] via SUBCUTANEOUS
  Filled 2022-02-18 (×3): qty 0.05

## 2022-02-18 MED ORDER — SODIUM CHLORIDE 0.9 % IV SOLN: INTRAVENOUS | Status: DC

## 2022-02-18 NOTE — Evaluation (Signed)
Physical Therapy Evaluation Patient Details Name: Brett White MRN: [id number] DOB: 1961/08/24 Today's Date: 02/18/2022  History of Present Illness  61 yo male admitted with Pna. Hx of DM, neuropathy, CVA, L BKA 2022  Clinical Impression  On eval, pt required Min guard-Min A for mobility. He walked ~50 feet with a RW. Pt declined wearing prosthesis for this walk-he was able to hop with RW. He did have one instance of R knee buckling towards end of distance. He also tends to try to sit before safely positioned. He is agreeable to wearing prosthesis for next session. Noted R hand swelling-pt reported-RN in to assess. PT recommendation is for HHPT f/u. Will plan to follow pt during hospital stay.        Recommendations for follow up therapy are one component of a multi-disciplinary discharge planning process, led by the attending physician.  Recommendations may be updated based on patient status, additional functional criteria and insurance authorization.  Follow Up Recommendations Home health PT    Assistance Recommended at Discharge Intermittent Supervision/Assistance  Patient can return home with the following       Equipment Recommendations None recommended by PT  Recommendations for Other Services       Functional Status Assessment Patient has had a recent decline in their functional status and demonstrates the ability to make significant improvements in function in a reasonable and predictable amount of time.     Precautions / Restrictions Precautions Precautions: Fall Precaution Comments: L BKA Required Braces or Orthoses: Other Brace Other Brace: has L LE prosthesis Restrictions Weight Bearing Restrictions: No      Mobility  Bed Mobility Overal bed mobility: Modified Independent                  Transfers Overall transfer level: Needs assistance Equipment used: Rolling walker (2 wheels) Transfers: Sit to/from Stand Sit to Stand: Min guard, From elevated  surface           General transfer comment: Min guard for safety. Cues for safety.    Ambulation/Gait Ambulation/Gait assistance: Min assist Gait Distance (Feet): 50 Feet Assistive device: Rolling walker (2 wheels) Gait Pattern/deviations: Step-to pattern       General Gait Details: Pt declined donning prosthesis for this walk. He hopped using RW. One instance of R knee buckling towards end of distance. Will have him don prosthesis next session.  Stairs            Wheelchair Mobility    Modified Rankin (Stroke Patients Only)       Balance Overall balance assessment: Needs assistance         Standing balance support: Bilateral upper extremity supported, During functional activity, Reliant on assistive device for balance Standing balance-Leahy Scale: Fair                               Pertinent Vitals/Pain Pain Assessment Pain Assessment: No/denies pain    Home Living Family/patient expects to be discharged to:: Private residence Living Arrangements: Alone   Type of Home: Apartment Home Access: Level entry       Home Layout: One level Home Equipment: Conservation officer, nature (2 wheels);Wheelchair - manual      Prior Function Prior Level of Function : Independent/Modified Independent             Mobility Comments: pt reports ambulating very short distances from bed to bathroom; denies using WC recently  Hand Dominance   Dominant Hand: Right    Extremity/Trunk Assessment   Upper Extremity Assessment Upper Extremity Assessment: Overall WFL for tasks assessed    Lower Extremity Assessment Lower Extremity Assessment: Generalized weakness    Cervical / Trunk Assessment Cervical / Trunk Assessment: Normal  Communication   Communication: No difficulties  Cognition Arousal/Alertness: Awake/alert Behavior During Therapy: WFL for tasks assessed/performed Overall Cognitive Status: Within Functional Limits for tasks assessed                                           General Comments      Exercises     Assessment/Plan    PT Assessment Patient needs continued PT services  PT Problem List Decreased strength;Decreased mobility;Decreased activity tolerance;Decreased balance;Decreased knowledge of use of DME       PT Treatment Interventions DME instruction;Therapeutic activities;Patient/family education;Functional mobility training;Balance training    PT Goals (Current goals can be found in the Care Plan section)  Acute Rehab PT Goals Patient Stated Goal: to feel better PT Goal Formulation: With patient Time For Goal Achievement: 03/04/22 Potential to Achieve Goals: Good    Frequency Min 3X/week     Co-evaluation               AM-PAC PT "6 Clicks" Mobility  Outcome Measure Help needed turning from your back to your side while in a flat bed without using bedrails?: A Little Help needed moving from lying on your back to sitting on the side of a flat bed without using bedrails?: A Little Help needed moving to and from a bed to a chair (including a wheelchair)?: A Little Help needed standing up from a chair using your arms (e.g., wheelchair or bedside chair)?: A Little Help needed to walk in hospital room?: A Little Help needed climbing 3-5 steps with a railing? : A Lot 6 Click Score: 17    End of Session Equipment Utilized During Treatment: Gait belt Activity Tolerance: Patient limited by fatigue Patient left: in chair;with call bell/phone within reach   PT Visit Diagnosis: Muscle weakness (generalized) (M62.81);Difficulty in walking, not elsewhere classified (R26.2)    Time: [id number] PT Time Calculation (min) (ACUTE ONLY): 15 min   Charges:   PT Evaluation $PT Eval Moderate Complexity: [place], PT Acute Rehabilitation  Office: [phone] Pager: [phone]

## 2022-02-18 NOTE — Progress Notes (Signed)
[facility] PROGRESS NOTE    Progress Note  Andr… Brading  [id number] DOB: 1960/10/18 DOA: 02/16/2022 PCP: Arthur Holms, NP     Brief Narrative:   Brett White is an 61 y.o. male past medical history significant for GERD, insulin-dependent diabetes mellitus type 2, diabetic peripheral neuropathy, nonhemorrhagic CVA, left foot osteomyelitis, history of left BKA who came into the ED complaining of a sore throat, dry cough fever chills fatigue and decreased appetite, she is also been complaining of pleuritic chest pain.  In the ED was found to have a pulse of 120, chest x-ray showed mild right lower lobe pneumonia start empirically on antibiotics and fluid resuscitated.    Assessment/Plan:   CAP (community acquired pneumonia): Transition to oral Omnicef and azithromycin. Urine antigens were negative. Has remained afebrile leukocytosis improving. No appetite. Culture data has remained negative till date.  Acute kidney injury: With a baseline creatinine of 0.8 back in March of this year on admission 1.5. The setting of  ACE inhibitor and NSAID use. Creatinine is unchanged from yesterday. Continue to hold ARB continue fluid resuscitation recheck tomorrow morning.  Uncontrolled diabetes mellitus with hyperglycemia: With a last A1c of 11. Hold metformin, discontinue glipizide in the setting of acute kidney injury. Blood glucose continues to be elevated continue sliding scale insulin we will add low-dose long-acting insulin.  Diabetic peripheral neuropathy: Continue duloxetine and Neurontin. With Neurontin in the setting of acute kidney injury.  Essential hypertension: Resume amlodipine, hold ACE inhibitor continue metoprolol. Continue current regimen blood pressures fairly controlled.    DVT prophylaxis: lovenox Family Communication:none Status is: Inpatient Remains inpatient appropriate because: Pneumonia    Code Status:     Code Status Orders  (From  admission, onward)           Start     Ordered   02/16/22 1351  Full code  Continuous        02/16/22 1354           Code Status History     Date Active Date Inactive Code Status Order ID Comments User Context   12/09/2021 2303 12/16/2021 0718 Full Code [id number]  Chotiner, Yevonne Aline, MD Inpatient   10/04/2021 0107 10/20/2021 2231 Full Code [id number]  Vianne Bulls, MD ED   03/17/2021 0422 03/29/2021 2310 Full Code [id number]  Kayleen Memos, DO ED   03/03/2021 1839 03/15/2021 2153 Full Code [id number]  Persons, Bevely Palmer, [place] Inpatient   02/03/2021 0159 02/19/2021 1858 Full Code [id number]  Shalhoub, Sherryll Burger, MD ED         IV Access:   Peripheral IV   Procedures and diagnostic studies:   DG Chest 2 View  Result Date: 02/16/2022 CLINICAL DATA:  Cough for 1 week.  Recent pneumonia. EXAM: CHEST - 2 VIEW COMPARISON:  12/13/2021 FINDINGS: The heart size and mediastinal contours are within normal limits. Mild infiltrate is seen in the right middle lobe, suspicious for pneumonia. Left lung is clear. No evidence of pleural effusion. IMPRESSION: Mild right middle lobe infiltrate, suspicious for pneumonia. Recommend continued chest radiographic follow-up to confirm resolution. Electronically Signed   By: Marlaine Hind M.D.   On: 02/16/2022 12:13     Medical Consultants:   None.   Subjective:    Brett White [place] headaches are slightly improved temporarily poor appetite.  Objective:    Vitals:   02/17/22 1341 02/17/22 1409 02/17/22 1947 02/18/22 0520  BP: 101/66  (!) 144/78 (!) 142/80  Pulse: 83  91 87  Resp: 16 15 18 18   Temp: 97.8 F (36.6 C)  98.7 F (37.1 C) 98.1 F (36.7 C)  TempSrc: Oral  Oral Oral  SpO2: 95% 95% 93% 93%  Weight:      Height:       SpO2: 93 % O2 Flow Rate (L/min): 2 L/min FiO2 (%): 28 %   Intake/Output Summary (Last 24 hours) at 02/18/2022 0803 Last data filed at 02/17/2022 1835 Gross per 24 hour  Intake 3720.53 ml  Output 500 ml  Net 3220.53 ml     Filed Weights   02/16/22 1154  Weight: 95.3 kg    Exam: General exam: In no acute distress. Respiratory system: Good air movement and clear to auscultation. Cardiovascular system: S1 & S2 heard, RRR. No JVD. Gastrointestinal system: Abdomen is nondistended, soft and nontender.  Extremities: No pedal edema. Skin: No rashes, lesions or ulcers Psychiatry: Judgement and insight appear normal. Mood & affect appropriate.   Data Reviewed:    Labs: Basic Metabolic Panel: Recent Labs  Lab 02/16/22 1230 02/17/22 0544 02/18/22 0456  NA 133* 134* 134*  K 4.5 4.6 4.7  CL 95* 99 100  CO2 25 24 24   GLUCOSE 437* 316* 224*  BUN 23* 27* 31*  CREATININE 1.50* 1.58* 1.48*  CALCIUM 9.2 8.5* 8.1*  MG 2.1  --   --   PHOS 3.6  --   --     GFR Estimated Creatinine Clearance: 63.3 mL/min (A) (by C-G formula based on SCr of 1.48 mg/dL (H)). Liver Function Tests: Recent Labs  Lab 02/16/22 1230 02/17/22 0544  AST 20 18  ALT 16 14  ALKPHOS 116 99  BILITOT 1.1 0.8  PROT 8.1 7.3  ALBUMIN 4.0 3.5    Recent Labs  Lab 02/16/22 1230  LIPASE 20    No results for input(s): AMMONIA in the last 168 hours. Coagulation profile Recent Labs  Lab 02/16/22 1255  INR 1.0    COVID-19 Labs  No results for input(s): DDIMER, FERRITIN, LDH, CRP in the last 72 hours.  Lab Results  Component Value Date   SARSCOV2NAA NEGATIVE 12/09/2021   SARSCOV2NAA NEGATIVE 10/19/2021   [place] NEGATIVE 10/04/2021   [place] NEGATIVE 06/29/2021    CBC: Recent Labs  Lab 02/16/22 1230 02/17/22 0544  WBC 7.0 10.1  HGB 15.0 13.0  HCT 44.6 40.8  MCV 84.2 87.2  PLT 249 227    Cardiac Enzymes: No results for input(s): CKTOTAL, CKMB, CKMBINDEX, TROPONINI in the last 168 hours. BNP (last 3 results) No results for input(s): PROBNP in the last 8760 hours. CBG: Recent Labs  Lab 02/16/22 2048 02/17/22 0750 02/17/22 1123 02/17/22 1948 02/18/22 0722  GLUCAP 221* 289* 297* 129* 196*     D-Dimer: No results for input(s): DDIMER in the last 72 hours. Hgb A1c: No results for input(s): HGBA1C in the last 72 hours. Lipid Profile: No results for input(s): CHOL, HDL, LDLCALC, TRIG, CHOLHDL, LDLDIRECT in the last 72 hours. Thyroid function studies: No results for input(s): TSH, T4TOTAL, T3FREE, THYROIDAB in the last 72 hours.  Invalid input(s): FREET3 Anemia work up: No results for input(s): VITAMINB12, FOLATE, FERRITIN, TIBC, IRON, RETICCTPCT in the last 72 hours. Sepsis Labs: Recent Labs  Lab 02/16/22 1230 02/16/22 1255 02/16/22 1842 02/16/22 2240 02/17/22 0544  WBC 7.0  --   --   --  10.1  LATICACIDVEN  --  1.8 2.4* 1.6  --     Microbiology Recent Results (from the past 240 hour(s))  Group A  Strep by PCR     Status: None   Collection Time: 02/16/22 12:00 PM   Specimen: Throat; Sterile Swab  Result Value Ref Range Status   Group A Strep by PCR NOT DETECTED NOT DETECTED Final    Comment: Performed at [facility], [place] [address], [place], [place] [address]  Blood Culture (routine x 2)     Status: None (Preliminary result)   Collection Time: 02/16/22 12:55 PM   Specimen: BLOOD  Result Value Ref Range Status   Specimen Description   Final    BLOOD LEFT ANTECUBITAL Performed at [facility] [address] [place], [place] [address]    Special Requests   Final    BOTTLES DRAWN AEROBIC AND ANAEROBIC Blood Culture adequate volume Performed at [facility] [address] [place], [place] [address]    Culture   Final    NO GROWTH < 24 HOURS Performed at [facility] [address] [place], [place] [address]    Report Status PENDING  Incomplete     Medications:    amLODipine  5 mg Oral Daily   atorvastatin  20 mg Oral QHS   dapagliflozin propanediol  10 mg Oral q morning   DULoxetine  60 mg Oral BID   enoxaparin (LOVENOX) injection  40 mg Subcutaneous Q24H   gabapentin  300 mg Oral  BID   insulin aspart  0-5 Units Subcutaneous QHS   insulin aspart  0-9 Units Subcutaneous TID WC   insulin aspart  3 Units Subcutaneous TID WC   insulin detemir  5 Units Subcutaneous BID   ipratropium-albuterol  3 mL Nebulization TID   metoprolol tartrate  12.5 mg Oral BID   mometasone-formoterol  2 puff Inhalation BID   pantoprazole  40 mg Oral Daily   Continuous Infusions:  azithromycin 500 mg (02/17/22 1422)   cefTRIAXone (ROCEPHIN)  IV 2 g (02/17/22 1256)      LOS: 2 days   Charlynne Cousins  [facility]  02/18/2022, 8:03 AM

## 2022-02-18 NOTE — Progress Notes (Addendum)
Patient asked for pain med for headache, pain med given as ordered.  Patient states that this headache is not new to him.  He states that he has this kind of head ache for about six months now.  It is aching, throbbing, posteriorly and both sides of his head almost every week which goes on for 3 days.  He usually takes percocet and tizanidine for it but they really have not helped.  Dr. Robb Matar aware.

## 2022-02-18 NOTE — Progress Notes (Signed)
Responded to pt's call, pt asked "What state am I in?".  Patient reoriented, inquired is he knows where he is and he said "I think I am in a hospital".  Patient able to state his name and DOB.  Reminded on plan of treatment.  Patient still c/o headache 10/10, states he is seeing shadows, "there are three shadows there but I know they are not there, I hear some banging and thumping sounds around too".  VSS, see flowsheet, in no distress.  Pain medicines given as ordered.  Needs addressed.  Dr. Olevia Bowens made aware.

## 2022-02-18 NOTE — Progress Notes (Signed)
Patient saturation 98% on 2L O2, patient taken off oxygen at this time.  Coached on using IS, patient demonstrated knowledge, will follow through.

## 2022-02-19 DIAGNOSIS — I10 Essential (primary) hypertension: Secondary | ICD-10-CM | POA: Diagnosis not present

## 2022-02-19 DIAGNOSIS — N179 Acute kidney failure, unspecified: Secondary | ICD-10-CM | POA: Diagnosis not present

## 2022-02-19 DIAGNOSIS — J189 Pneumonia, unspecified organism: Secondary | ICD-10-CM | POA: Diagnosis not present

## 2022-02-19 DIAGNOSIS — E1142 Type 2 diabetes mellitus with diabetic polyneuropathy: Secondary | ICD-10-CM | POA: Diagnosis not present

## 2022-02-19 LAB — BASIC METABOLIC PANEL
Anion gap: 10 (ref 5–15)
BUN: 27 mg/dL — ABNORMAL HIGH (ref 6–20)
CO2: 24 mmol/L (ref 22–32)
Calcium: 7.9 mg/dL — ABNORMAL LOW (ref 8.9–10.3)
Chloride: 99 mmol/L (ref 98–111)
Creatinine, Ser: 1.26 mg/dL — ABNORMAL HIGH (ref 0.61–1.24)
GFR, Estimated: >60 mL/min (ref >60)
Glucose, Bld: 166 mg/dL — ABNORMAL HIGH (ref 70–99)
Potassium: 4.2 mmol/L (ref 3.5–5.1)
Sodium: 133 mmol/L — ABNORMAL LOW (ref 135–145)

## 2022-02-19 LAB — GLUCOSE, CAPILLARY
Glucose-Capillary: 124 mg/dL — ABNORMAL HIGH (ref 70–99)
Glucose-Capillary: 192 mg/dL — ABNORMAL HIGH (ref 70–99)

## 2022-02-19 MED ORDER — INSULIN DETEMIR 100 UNIT/ML ~~LOC~~ SOLN
10.0000 [IU] | Freq: Two times a day (BID) | SUBCUTANEOUS | Status: DC
  Administered 2022-02-19: 10 [IU] via SUBCUTANEOUS
  Filled 2022-02-19 (×2): qty 0.1

## 2022-02-19 MED ORDER — LOSARTAN POTASSIUM 50 MG PO TABS: 50.0000 mg | ORAL_TABLET | Freq: Every day | ORAL | Status: DC

## 2022-02-19 MED ORDER — AZITHROMYCIN 500 MG PO TABS: 500.0000 mg | ORAL_TABLET | Freq: Every day | ORAL | 0 refills | Status: AC

## 2022-02-19 MED ORDER — CEFDINIR 300 MG PO CAPS: 300.0000 mg | ORAL_CAPSULE | Freq: Two times a day (BID) | ORAL | 0 refills | Status: AC

## 2022-02-19 NOTE — Discharge Summary (Signed)
Physician Discharge Summary  Brett White MHD:[id number] DOB: 22-Nov-1960 DOA: 02/16/2022  PCP: Loura Back, NP  Admit date: 02/16/2022 Discharge date: 02/19/2022  Admitted From: Home Disposition:  Home  Recommendations for Outpatient Follow-up:  Follow up with PCP in 1-2 weeks Please obtain BMP/CBC in one week   Home Health:Yes Equipment/Devices:None  Discharge Condition:Stable CODE STATUS:Full Diet recommendation: Heart Healthy    Brief/Interim Summary: 61 y.o. male past medical history significant for GERD, insulin-dependent diabetes mellitus type 2, diabetic peripheral neuropathy, nonhemorrhagic CVA, left foot osteomyelitis, history of left BKA who came into the ED complaining of a sore throat, dry cough fever chills fatigue and decreased appetite, she is also been complaining of pleuritic chest pain.  In the ED was found to have a pulse of 120, chest x-ray showed mild right lower lobe pneumonia start empirically on antibiotics and fluid resuscitated.    Discharge Diagnoses:  Principal Problem:   CAP (community acquired pneumonia) Active Problems:   Uncontrolled type 2 diabetes mellitus with hyperglycemia (HCC)   Diabetic peripheral neuropathy associated with type 2 diabetes mellitus (HCC)   Essential hypertension   AKI (acute kidney injury) (HCC)  Community-acquired pneumonia: Started initially on IV azithromycin and Rocephin and supplemental oxygen. He defervesced urine antigens were negative he had minimal appetite which improved throughout hospital stay. Culture data remain negative. He was transitioned to oral Omnicef and azithromycin which she will continue as an outpatient for an additional day.  Acute kidney injury: With a baseline of 0.8 in the setting of ARB inhibitor, NSAIDs. These were held she was fluid resuscitated and his creatinine returned to baseline. He will try to avoid NSAIDs. He is to start his ARB in 1 week.  Uncontrolled diabetes mellitus with  hyperglycemia: With an A1c of 11 his oral hypoglycemic agents were held due to his acute kidney injury he was started on long-acting insulin plus sliding scale. Now that his creatinine has returned to baseline we will resume oral hypoglycemic agents as an outpatient.  Diabetes peripheral neuropathy: No changes made to his medication continue duloxetine and Neurontin.  Essential hypertension: His ARB was held he will resume it as an outpatient his blood pressure remained fairly controlled.  Discharge Instructions  Discharge Instructions     Diet - low sodium heart healthy   Complete by: As directed    Increase activity slowly   Complete by: As directed       Allergies as of 02/19/2022   No Known Allergies      Medication List     STOP taking these medications    HYDROcodone-acetaminophen 10-325 MG tablet Commonly known as: NORCO   menthol-cetylpyridinium 3 MG lozenge Commonly known as: CEPACOL   naproxen 500 MG tablet Commonly known as: NAPROSYN   Oxycodone HCl 10 MG Tabs       TAKE these medications    acetaminophen 500 MG tablet Commonly known as: TYLENOL Take 2 tablets (1,000 mg total) by mouth every 8 (eight) hours as needed.   Advair HFA 45-21 MCG/ACT inhaler Generic drug: fluticasone-salmeterol Inhale 2 puffs into the lungs 2 (two) times daily.   albuterol 108 (90 Base) MCG/ACT inhaler Commonly known as: VENTOLIN HFA Inhale 2 puffs into the lungs every 6 (six) hours as needed for wheezing or shortness of breath.   amLODipine 5 MG tablet Commonly known as: NORVASC Take 1 tablet (5 mg total) by mouth daily.   Aspirin Low Dose 81 MG chewable tablet Generic drug: aspirin Chew 1 tablet (81 mg total)  by mouth daily.   atorvastatin 20 MG tablet Commonly known as: LIPITOR Take 20 mg by mouth at bedtime.   azithromycin 500 MG tablet Commonly known as: Zithromax Take 1 tablet (500 mg total) by mouth daily for 3 days. Take 1 tablet daily for 3 days.    cefdinir 300 MG capsule Commonly known as: OMNICEF Take 1 capsule (300 mg total) by mouth 2 (two) times daily for 3 days.   cyclobenzaprine 5 MG tablet Commonly known as: FLEXERIL Take 5 mg by mouth 3 (three) times daily as needed for muscle spasms.   DULoxetine 30 MG capsule Commonly known as: CYMBALTA Take 1 capsule (30 mg total) by mouth daily.   DULoxetine 60 MG capsule Commonly known as: CYMBALTA 60 mg 2 (two) times daily.   Farxiga 10 MG Tabs tablet Generic drug: dapagliflozin propanediol Take 10 mg by mouth every morning.   fluticasone 50 MCG/ACT nasal spray Commonly known as: FLONASE Place 1 spray into both nostrils daily. What changed:  when to take this reasons to take this   gabapentin 300 MG capsule Commonly known as: NEURONTIN Take 300 mg by mouth 2 (two) times daily.   glipiZIDE 5 MG 24 hr tablet Commonly known as: GLUCOTROL XL Take 5 mg by mouth every morning.   HYDROmorphone 2 MG tablet Commonly known as: Dilaudid Take 1 tablet (2 mg total) by mouth every 4 (four) hours as needed for severe pain.   loratadine 10 MG tablet Commonly known as: CLARITIN Take 1 tablet (10 mg total) by mouth daily for 14 days.   losartan 50 MG tablet Commonly known as: COZAAR Take 1 tablet (50 mg total) by mouth daily. Start taking on: Feb 24, 2022 What changed: These instructions start on Feb 24, 2022. If you are unsure what to do until then, ask your doctor or other care provider.   metFORMIN 1000 MG tablet Commonly known as: GLUCOPHAGE Take 1,000 mg by mouth 2 (two) times daily with a meal.   metoprolol tartrate 25 MG tablet Commonly known as: LOPRESSOR Take 1/2 tablet (12.5 mg total) by mouth 2 (two) times daily.   Mounjaro 5 MG/0.5ML Pen Generic drug: tirzepatide Inject 5 mg into the skin every Tuesday. At noon   NovoLOG Mix 70/30 FlexPen (70-30) 100 UNIT/ML FlexPen Generic drug: insulin aspart protamine - aspart Inject 7 units twice daily as directed.    ondansetron 4 MG tablet Commonly known as: ZOFRAN Take 1 tablet (4 mg total) by mouth every 6 (six) hours as needed for nausea.   Ozempic (2 MG/DOSE) 8 MG/3ML Sopn Generic drug: Semaglutide (2 MG/DOSE) Inject 2 mg into the skin once a week. Wednesday   pantoprazole 40 MG tablet Commonly known as: PROTONIX Take 1 tablet (40 mg total) by mouth daily.   Pentips 32G X 4 MM Misc Generic drug: Insulin Pen Needle Use to inject insulin up to 4 times daily as needed.   polyethylene glycol powder 17 GM/SCOOP powder Commonly known as: GLYCOLAX/MIRALAX Take 1 capful (17 g) by mouth 2 (two) times daily.   pregabalin 100 MG capsule Commonly known as: LYRICA Take 100 mg by mouth daily as needed.   Senexon-S 8.6-50 MG tablet Generic drug: senna-docusate Take 2 tablets by mouth 2 (two) times daily.   tiZANidine 4 MG tablet Commonly known as: ZANAFLEX Take 4 mg by mouth every 8 (eight) hours as needed for muscle spasms.        No Known Allergies  Consultations: None   Procedures/Studies: DG  Chest 2 View  Result Date: 02/16/2022 CLINICAL DATA:  Cough for 1 week.  Recent pneumonia. EXAM: CHEST - 2 VIEW COMPARISON:  12/13/2021 FINDINGS: The heart size and mediastinal contours are within normal limits. Mild infiltrate is seen in the right middle lobe, suspicious for pneumonia. Left lung is clear. No evidence of pleural effusion. IMPRESSION: Mild right middle lobe infiltrate, suspicious for pneumonia. Recommend continued chest radiographic follow-up to confirm resolution. Electronically Signed   By: Danae Orleans M.D.   On: 02/16/2022 12:13   (Echo, Carotid, EGD, Colonoscopy, ERCP)    Subjective: No complaints  Discharge Exam: Vitals:   02/19/22 0535 02/19/22 0758  BP: 124/87   Pulse: 81   Resp: 18   Temp: 97.8 F (36.6 C)   SpO2: 93% 93%   Vitals:   02/18/22 1438 02/18/22 2034 02/19/22 0535 02/19/22 0758  BP:  (!) 163/94 124/87   Pulse:  100 81   Resp:  18 18   Temp:   98.2 F (36.8 C) 97.8 F (36.6 C)   TempSrc:  Oral Oral   SpO2: 97% 100% 93% 93%  Weight:      Height:        General: Pt is alert, awake, not in acute distress Cardiovascular: RRR, S1/S2 +, no rubs, no gallops Respiratory: CTA bilaterally, no wheezing, no rhonchi Abdominal: Soft, NT, ND, bowel sounds + Extremities: no edema, no cyanosis    The results of significant diagnostics from this hospitalization (including imaging, microbiology, ancillary and laboratory) are listed below for reference.     Microbiology: Recent Results (from the past 240 hour(s))  Group A Strep by PCR     Status: None   Collection Time: 02/16/22 12:00 PM   Specimen: Throat; Sterile Swab  Result Value Ref Range Status   Group A Strep by PCR NOT DETECTED NOT DETECTED Final    Comment: Performed at [facility], [address], [place], [place] [address]  Blood Culture (routine x 2)     Status: None (Preliminary result)   Collection Time: 02/16/22 12:55 PM   Specimen: BLOOD  Result Value Ref Range Status   Specimen Description   Final    BLOOD LEFT ANTECUBITAL Performed at [facility], [address], [place], [place] [address]    Special Requests   Final    BOTTLES DRAWN AEROBIC AND ANAEROBIC Blood Culture adequate volume Performed at [facility], [address] [place], [place] [address]    Culture   Final    NO GROWTH 3 DAYS Performed at [facility], [address], [place], [place] [address]    Report Status PENDING  Incomplete  Urine Culture     Status: None   Collection Time: 02/16/22  6:17 PM   Specimen: In/Out Cath Urine  Result Value Ref Range Status   Specimen Description   Final    IN/OUT CATH URINE Performed at [facility], [address], [place], [place] [address]    Special Requests   Final    NONE Performed at [facility], [address] [place], [place] [address]     Culture   Final    NO GROWTH Performed at [facility], [address] [place], [place] [address]    Report Status 02/18/2022 FINAL  Final  Blood Culture (routine x 2)     Status: None (Preliminary result)   Collection Time: 02/16/22 10:40 PM   Specimen:  BLOOD  Result Value Ref Range Status   Specimen Description   Final    BLOOD RIGHT ANTECUBITAL Performed at [facility], [address] [place], [place] [address]    Special Requests   Final    BOTTLES DRAWN AEROBIC ONLY Blood Culture results may not be optimal due to an inadequate volume of blood received in culture bottles Performed at [facility], [address] [place], [place] [address]    Culture   Final    NO GROWTH 2 DAYS Performed at [facility], [address] [place], [place] [address]    Report Status PENDING  Incomplete     Labs: BNP (last 3 results) No results for input(s): BNP in the last 8760 hours. Basic Metabolic Panel: Recent Labs  Lab 02/16/22 1230 02/17/22 0544 02/18/22 0456 02/19/22 0427  NA 133* 134* 134* 133*  K 4.5 4.6 4.7 4.2  CL 95* 99 100 99  CO2 GLUCOSE 437* 316* 224* 166*  BUN 23* 27* 31* 27*  CREATININE 1.50* 1.58* 1.48* 1.26*  CALCIUM 9.2 8.5* 8.1* 7.9*  MG 2.1  --   --   --   PHOS 3.6  --   --   --    Liver Function Tests: Recent Labs  Lab 02/16/22 1230 02/17/22 0544  AST 20 18  ALT 16 14  ALKPHOS 116 99  BILITOT 1.1 0.8  PROT 8.1 7.3  ALBUMIN 4.0 3.5   Recent Labs  Lab 02/16/22 1230  LIPASE 20   No results for input(s): AMMONIA in the last 168 hours. CBC: Recent Labs  Lab 02/16/22 1230 02/17/22 0544  WBC 7.0 10.1  HGB 15.0 13.0  HCT 44.6 40.8  MCV 84.2 87.2  PLT 249 227   Cardiac Enzymes: No results for input(s): CKTOTAL, CKMB, CKMBINDEX, TROPONINI in the last 168 hours. BNP: Invalid input(s): POCBNP CBG: Recent Labs  Lab 02/18/22 0722 02/18/22 1113 02/18/22 1643 02/18/22 2034  02/19/22 0757  GLUCAP 196* 182* 164* 148* 124*   D-Dimer No results for input(s): DDIMER in the last 72 hours. Hgb A1c No results for input(s): HGBA1C in the last 72 hours. Lipid Profile No results for input(s): CHOL, HDL, LDLCALC, TRIG, CHOLHDL, LDLDIRECT in the last 72 hours. Thyroid function studies No results for input(s): TSH, T4TOTAL, T3FREE, THYROIDAB in the last 72 hours.  Invalid input(s): FREET3 Anemia work up No results for input(s): VITAMINB12, FOLATE, FERRITIN, TIBC, IRON, RETICCTPCT in the last 72 hours. Urinalysis    Component Value Date/Time   COLORURINE YELLOW 02/16/2022 1817   APPEARANCEUR CLEAR 02/16/2022 1817   LABSPEC 1.033 (H) 02/16/2022 1817   PHURINE 5.0 02/16/2022 1817   GLUCOSEU >=500 (A) 02/16/2022 1817   HGBUR NEGATIVE 02/16/2022 1817   BILIRUBINUR NEGATIVE 02/16/2022 1817   KETONESUR 5 (A) 02/16/2022 1817   PROTEINUR 100 (A) 02/16/2022 1817   NITRITE NEGATIVE 02/16/2022 1817   LEUKOCYTESUR NEGATIVE 02/16/2022 1817   Sepsis Labs Invalid input(s): PROCALCITONIN,  WBC,  LACTICIDVEN Microbiology Recent Results (from the past 240 hour(s))  Group A Strep by PCR     Status: None   Collection Time: 02/16/22 12:00 PM   Specimen: Throat; Sterile Swab  Result Value Ref Range Status   Group A Strep by PCR NOT DETECTED NOT DETECTED Final    Comment: Performed at [facility], [address] [place], [place] [address]  Blood Culture (routine x 2)     Status:  None (Preliminary result)   Collection Time: 02/16/22 12:55 PM   Specimen: BLOOD  Result Value Ref Range Status   Specimen Description   Final    BLOOD LEFT ANTECUBITAL Performed at [facility], [address] [place], [place] [address]    Special Requests   Final    BOTTLES DRAWN AEROBIC AND ANAEROBIC Blood Culture adequate volume Performed at [facility], [address] [place], [place] [address]    Culture   Final    NO GROWTH 3  DAYS Performed at [facility], [address], [place], [place] [address]    Report Status PENDING  Incomplete  Urine Culture     Status: None   Collection Time: 02/16/22  6:17 PM   Specimen: In/Out Cath Urine  Result Value Ref Range Status   Specimen Description   Final    IN/OUT CATH URINE Performed at [facility], [address] [place], [place] [address]    Special Requests   Final    NONE Performed at [facility], [address] [place], [place] [address]    Culture   Final    NO GROWTH Performed at [facility], [address] [place], [place] [address]    Report Status 02/18/2022 FINAL  Final  Blood Culture (routine x 2)     Status: None (Preliminary result)   Collection Time: 02/16/22 10:40 PM   Specimen: BLOOD  Result Value Ref Range Status   Specimen Description   Final    BLOOD RIGHT ANTECUBITAL Performed at [facility], [address] [place], [place] [address]    Special Requests   Final    BOTTLES DRAWN AEROBIC ONLY Blood Culture results may not be optimal due to an inadequate volume of blood received in culture bottles Performed at [facility], [address] [place], [place] [address]    Culture   Final    NO GROWTH 2 DAYS Performed at [facility], [address] [place], [place] [address]    Report Status PENDING  Incomplete    SIGNED:   Marinda Elk, MD  [facility] 02/19/2022, 8:46 AM Pager   If 7PM-7AM, please contact night-coverage [email or web address] Password [email or web address]

## 2022-02-19 NOTE — TOC Transition Note (Addendum)
Transition of Care [facility]) - CM/SW Discharge Note   Patient Details  Name: Brett White MRN: [id number] Date of Birth: 1960/12/09  Transition of Care [facility] CM/SW Contact:  Darleene Cleaver, LCSW Phone Number: 02/19/2022, 12:22 PM   Clinical Narrative:     CSW spoke to patient and informed him that EMS was scheduled for transport.  CSW asked patient if he was interested in [facility] PT and OT and he said no he does not feel like he needs it.  CSW confirmed with him that the orders are in if he changes his mind.  Patient still stated he does not feel like he needs it, patient is refusing HH.  TOC signing off, please reconsult if other TOC needs arise.  Final next level of care: Home/Self Care Barriers to Discharge: Barriers Resolved   Patient Goals and CMS Choice Patient states their goals for this hospitalization and ongoing recovery are:: To return back home. CMS Medicare.gov Compare Post Acute Care list provided to:: Patient Choice offered to / list presented to : Patient  Discharge Placement                       Discharge Plan and Services                                     Social Determinants of Health (SDOH) Interventions     Readmission Risk Interventions     View : No data to display.

## 2022-02-21 LAB — CULTURE, BLOOD (ROUTINE X 2)
Culture: NO GROWTH
Special Requests: ADEQUATE

## 2022-02-22 ENCOUNTER — Other Ambulatory Visit (HOSPITAL_COMMUNITY): Payer: Self-pay

## 2022-02-22 LAB — CULTURE, BLOOD (ROUTINE X 2): Culture: NO GROWTH

## 2022-05-08 ENCOUNTER — Other Ambulatory Visit (HOSPITAL_COMMUNITY): Payer: Self-pay

## 2022-05-22 ENCOUNTER — Ambulatory Visit (HOSPITAL_COMMUNITY)
Admission: EM | Admit: 2022-05-22 | Discharge: 2022-05-22 | Disposition: A | Discharge status: Other Acute Inpatient Hospital | Payer: Medicare Other

## 2022-05-22 ENCOUNTER — Emergency Department (HOSPITAL_COMMUNITY)
Admission: EM | Admit: 2022-05-22 | Discharge: 2022-05-23 | Disposition: A | Discharge status: Home / Self Care | Payer: Medicare Other | Attending: Student | Admitting: Student

## 2022-05-22 ENCOUNTER — Encounter (HOSPITAL_COMMUNITY): Payer: Self-pay

## 2022-05-22 ENCOUNTER — Other Ambulatory Visit: Payer: Self-pay

## 2022-05-22 DIAGNOSIS — R739 Hyperglycemia, unspecified: Secondary | ICD-10-CM

## 2022-05-22 DIAGNOSIS — Z7984 Long term (current) use of oral hypoglycemic drugs: Secondary | ICD-10-CM | POA: Insufficient documentation

## 2022-05-22 DIAGNOSIS — Z20822 Contact with and (suspected) exposure to covid-19: Secondary | ICD-10-CM | POA: Diagnosis not present

## 2022-05-22 DIAGNOSIS — G8929 Other chronic pain: Secondary | ICD-10-CM | POA: Diagnosis not present

## 2022-05-22 DIAGNOSIS — E119 Type 2 diabetes mellitus without complications: Secondary | ICD-10-CM | POA: Insufficient documentation

## 2022-05-22 DIAGNOSIS — M549 Dorsalgia, unspecified: Secondary | ICD-10-CM | POA: Diagnosis not present

## 2022-05-22 DIAGNOSIS — F32A Depression, unspecified: Secondary | ICD-10-CM | POA: Insufficient documentation

## 2022-05-22 DIAGNOSIS — Z794 Long term (current) use of insulin: Secondary | ICD-10-CM | POA: Insufficient documentation

## 2022-05-22 DIAGNOSIS — R45851 Suicidal ideations: Secondary | ICD-10-CM

## 2022-05-22 DIAGNOSIS — Y900 Blood alcohol level of less than 20 mg/100 ml: Secondary | ICD-10-CM | POA: Diagnosis not present

## 2022-05-22 DIAGNOSIS — Z046 Encounter for general psychiatric examination, requested by authority: Secondary | ICD-10-CM | POA: Diagnosis present

## 2022-05-22 DIAGNOSIS — F430 Acute stress reaction: Secondary | ICD-10-CM

## 2022-05-22 DIAGNOSIS — F4321 Adjustment disorder with depressed mood: Secondary | ICD-10-CM

## 2022-05-22 DIAGNOSIS — R441 Visual hallucinations: Secondary | ICD-10-CM | POA: Diagnosis not present

## 2022-05-22 DIAGNOSIS — Z7982 Long term (current) use of aspirin: Secondary | ICD-10-CM | POA: Insufficient documentation

## 2022-05-22 DIAGNOSIS — I10 Essential (primary) hypertension: Secondary | ICD-10-CM

## 2022-05-22 LAB — RAPID URINE DRUG SCREEN, HOSP PERFORMED
Amphetamines: NOT DETECTED
Barbiturates: NOT DETECTED
Benzodiazepines: NOT DETECTED
Cocaine: NOT DETECTED
Opiates: NOT DETECTED
Tetrahydrocannabinol: NOT DETECTED

## 2022-05-22 LAB — COMPREHENSIVE METABOLIC PANEL
ALT: 14 U/L (ref 0–44)
AST: 20 U/L (ref 15–41)
Albumin: 4.1 g/dL (ref 3.5–5.0)
Alkaline Phosphatase: 76 U/L (ref 38–126)
Anion gap: 12 (ref 5–15)
BUN: 26 mg/dL — ABNORMAL HIGH (ref 6–20)
CO2: 21 mmol/L — ABNORMAL LOW (ref 22–32)
Calcium: 9.4 mg/dL (ref 8.9–10.3)
Chloride: 104 mmol/L (ref 98–111)
Creatinine, Ser: 1.29 mg/dL — ABNORMAL HIGH (ref 0.61–1.24)
GFR, Estimated: >60 mL/min (ref >60)
Glucose, Bld: 294 mg/dL — ABNORMAL HIGH (ref 70–99)
Potassium: 4.5 mmol/L (ref 3.5–5.1)
Sodium: 137 mmol/L (ref 135–145)
Total Bilirubin: 0.6 mg/dL (ref 0.3–1.2)
Total Protein: 7.7 g/dL (ref 6.5–8.1)

## 2022-05-22 LAB — CBC WITH DIFFERENTIAL/PLATELET
Abs Immature Granulocytes: 0.04 10*3/uL (ref 0.00–0.07)
Basophils Absolute: 0.1 10*3/uL (ref 0.0–0.1)
Basophils Relative: 1 %
Eosinophils Absolute: 0.1 10*3/uL (ref 0.0–0.5)
Eosinophils Relative: 1 %
HCT: 44.5 % (ref 39.0–52.0)
Hemoglobin: 15 g/dL (ref 13.0–17.0)
Immature Granulocytes: 1 %
Lymphocytes Relative: 20 %
Lymphs Abs: 1.8 10*3/uL (ref 0.7–4.0)
MCH: 28 pg (ref 26.0–34.0)
MCHC: 33.7 g/dL (ref 30.0–36.0)
MCV: 83 fL (ref 80.0–100.0)
Monocytes Absolute: 0.5 10*3/uL (ref 0.1–1.0)
Monocytes Relative: 5 %
Neutro Abs: 6.4 10*3/uL (ref 1.7–7.7)
Neutrophils Relative %: 72 %
Platelets: 307 10*3/uL (ref 150–400)
RBC: 5.36 MIL/uL (ref 4.22–5.81)
RDW: 12.9 % (ref 11.5–15.5)
WBC: 8.8 10*3/uL (ref 4.0–10.5)
nRBC: 0 % (ref 0.0–0.2)

## 2022-05-22 LAB — RESP PANEL BY RT-PCR (FLU A&B, COVID) ARPGX2
Influenza A by PCR: NEGATIVE
Influenza B by PCR: NEGATIVE
SARS Coronavirus 2 by RT PCR: NEGATIVE

## 2022-05-22 LAB — ETHANOL: Alcohol, Ethyl (B): <10 mg/dL (ref <10)

## 2022-05-22 MED ORDER — PANTOPRAZOLE SODIUM 40 MG PO TBEC
40.0000 mg | DELAYED_RELEASE_TABLET | Freq: Every day | ORAL | Status: DC
  Administered 2022-05-22 – 2022-05-23 (×2): 40 mg via ORAL
  Filled 2022-05-22 (×2): qty 1

## 2022-05-22 MED ORDER — OXYCODONE HCL 5 MG PO TABS
10.0000 mg | ORAL_TABLET | Freq: Once | ORAL | Status: AC
  Administered 2022-05-22: 10 mg via ORAL
  Filled 2022-05-22: qty 2

## 2022-05-22 MED ORDER — OXYCODONE HCL 5 MG PO TABS
10.0000 mg | ORAL_TABLET | Freq: Four times a day (QID) | ORAL | Status: DC | PRN
  Administered 2022-05-22 – 2022-05-23 (×2): 10 mg via ORAL
  Filled 2022-05-22 (×2): qty 2

## 2022-05-22 MED ORDER — LOSARTAN POTASSIUM 50 MG PO TABS
50.0000 mg | ORAL_TABLET | Freq: Every day | ORAL | Status: DC
  Administered 2022-05-22 – 2022-05-23 (×2): 50 mg via ORAL
  Filled 2022-05-22 (×2): qty 1

## 2022-05-22 MED ORDER — DAPAGLIFLOZIN PROPANEDIOL 10 MG PO TABS
10.0000 mg | ORAL_TABLET | Freq: Every morning | ORAL | Status: DC
  Administered 2022-05-23: 10 mg via ORAL
  Filled 2022-05-22: qty 1

## 2022-05-22 MED ORDER — PREGABALIN 50 MG PO CAPS
100.0000 mg | ORAL_CAPSULE | Freq: Three times a day (TID) | ORAL | Status: DC
  Administered 2022-05-22 – 2022-05-23 (×2): 100 mg via ORAL
  Filled 2022-05-22 (×2): qty 2

## 2022-05-22 MED ORDER — ATORVASTATIN CALCIUM 10 MG PO TABS
20.0000 mg | ORAL_TABLET | Freq: Every day | ORAL | Status: DC
  Administered 2022-05-22: 20 mg via ORAL
  Filled 2022-05-22: qty 2

## 2022-05-22 MED ORDER — FUROSEMIDE 20 MG PO TABS
20.0000 mg | ORAL_TABLET | Freq: Every morning | ORAL | Status: DC
  Administered 2022-05-23: 20 mg via ORAL
  Filled 2022-05-22: qty 1

## 2022-05-22 MED ORDER — METFORMIN HCL 500 MG PO TABS
1000.0000 mg | ORAL_TABLET | Freq: Two times a day (BID) | ORAL | Status: DC
  Administered 2022-05-23: 1000 mg via ORAL
  Filled 2022-05-22: qty 2

## 2022-05-22 MED ORDER — GLIPIZIDE 10 MG PO TABS
10.0000 mg | ORAL_TABLET | Freq: Every morning | ORAL | Status: DC
  Administered 2022-05-23: 10 mg via ORAL
  Filled 2022-05-22: qty 1

## 2022-05-22 MED ORDER — CYCLOBENZAPRINE HCL 10 MG PO TABS
5.0000 mg | ORAL_TABLET | Freq: Three times a day (TID) | ORAL | Status: DC | PRN
Start: 2022-05-22 — End: 2022-05-23
  Administered 2022-05-22 – 2022-05-23 (×2): 5 mg via ORAL
  Filled 2022-05-22 (×2): qty 1

## 2022-05-22 MED ORDER — DULOXETINE HCL 30 MG PO CPEP
30.0000 mg | ORAL_CAPSULE | Freq: Every day | ORAL | Status: DC
  Administered 2022-05-22 – 2022-05-23 (×2): 30 mg via ORAL
  Filled 2022-05-22 (×2): qty 1

## 2022-05-22 MED ORDER — AMLODIPINE BESYLATE 5 MG PO TABS
10.0000 mg | ORAL_TABLET | Freq: Every morning | ORAL | Status: DC
  Administered 2022-05-23: 10 mg via ORAL
  Filled 2022-05-22: qty 2

## 2022-05-22 NOTE — BH Assessment (Signed)
Comprehensive Clinical Assessment (CCA) Note  05/22/2022 Brett White [id number]  Chief Complaint:  Chief Complaint  Patient presents with   Suicidal   Depression   Visit Diagnosis:   F32.3 Major depressive disorder, Single episode, With psychotic features  Flowsheet Row ED from 05/22/2022 in [facility] ED to Hosp-Admission (Discharged) from 02/16/2022 in [place] ED to Hosp-Admission (Discharged) from 12/09/2021 in [place] [facility]  C-SSRS RISK CATEGORY High Risk No Risk No Risk      The patient demonstrates the following risk factors for suicide: Chronic risk factors for suicide include: psychiatric disorder of n/a, substance use disorder, previous suicide attempts reports walking into traffic six months ago, and medical illness leg amputation . Acute risk factors for suicide include: social withdrawal/isolation and housing . Protective factors for this patient include: positive social support, positive therapeutic relationship, coping skills, hope for the future, and life satisfaction. Considering these factors, the overall suicide risk at this point appears to be high. Patient is not appropriate for outpatient follow up.  Disposition: Brett Dawley NP, patient meets impatient criteria. SW contacted and bed availability under review. Disposition discuss with Brett White.  Brett White is a 61 year old male who presents voluntarily to [facility] via uber cab and unaccompanied.  Pt reports he was referred by [facility] today.  Pt reports SI with a plan to overdose.  Pt reports one previous suicide attempt six months ago, by walking into traffic, "I don't want to be here anymore".  Pt reports seeing shadows figures.  Pt denies HI and Paranoia.  Pt acknowledged the following symptoms: isolation, sadness, fatigue, irritable, feeling worthlessness, and frustrated.   Pt reports sleeping two or three  hours during the night.  Pt reports decreased appetite, "I have lost fifteen pounds within three months". Pt denies drinking alcohol or using any other substance. Pt reports fifteen years of sobriety.  Pt identify his primary stressor as with housing, "I am living with a friend and her son, I don't like it there, I need my own house, I am on a walker and it is not enough of room to move around in there".  Pt reports he is currently retired and disable.  Pt reports his support person is his ex-wife.   Pt reports his mother had a history of depression.  Pt reports no history of family substance used. Pt denies any history of abuse or trauma.  Pt denies any current legal problems.  Pt denies any guns or weapons in the home.  Pt says he is not currently receiving weekly outpatient therapy; also, reports not receiving outpatient medication.  Pt reports no previous inpatient psychiatric hospitalization.  Pt is dressed casual,alert,oriented x 4 with normal speech and clam motor behavior.  Eye contact ts good.  Pt's mood is depressed and affect is depressed.  Thought process is relevant.  Pt's insight is good and judgment is is poor.  There is no indication pt is currently responding to internal stimuli or experiencing delusional thought content.  Pt was cooperative throughout assessment.  CCA Screening, Triage and Referral (STR)  Patient Reported Information How did you hear about [place]? Primary Care  What Is the Reason for Your Visit/Call Today? Pt reports suicidal ideations plan ot OD and worsening depression.  How Long Has This Been Causing You Problems? > than 6 months  What Do You Feel Would Help You the Most Today? Treatment  for Depression or other mood problem   Have You Recently Had Any Thoughts About Hurting Yourself? Yes  Are You Planning to Commit Suicide/Harm Yourself At This time? Yes   Have you Recently Had Thoughts About Hurting Someone Brett White? No  Are You Planning to Harm Someone at  This Time? No  Explanation: No data recorded  Have You Used Any Alcohol or Drugs in the Past 24 Hours? No  How Long Ago Did You Use Drugs or Alcohol? No data recorded What Did You Use and How Much? No data recorded  Do You Currently Have a Therapist/Psychiatrist? No data recorded Name of Therapist/Psychiatrist: No data recorded  Have You Been Recently Discharged From Any Office Practice or Programs? No data recorded Explanation of Discharge From Practice/Program: No data recorded    CCA Screening Triage Referral Assessment Type of Contact: No data recorded Telemedicine Service Delivery:   Is this Initial or Reassessment? No data recorded Date Telepsych consult ordered in [facility]:  No data recorded Time Telepsych consult ordered in [facility]:  No data recorded Location of Assessment: No data recorded Provider Location: No data recorded  Collateral Involvement: No data recorded  Does Patient Have a Court Appointed Legal Guardian? No data recorded Name and Contact of Legal Guardian: No data recorded If Minor and Not Living with Parent(s), Who has Custody? No data recorded Is CPS involved or ever been involved? No data recorded Is APS involved or ever been involved? No data recorded  Patient Determined To Be At Risk for Harm To Self or Others Based on Review of Patient Reported Information or Presenting Complaint? No data recorded Method: No data recorded Availability of Means: No data recorded Intent: No data recorded Notification Required: No data recorded Additional Information for Danger to Others Potential: No data recorded Additional Comments for Danger to Others Potential: No data recorded Are There Guns or Other Weapons in Your Home? No data recorded Types of Guns/Weapons: No data recorded Are These Weapons Safely Secured?                            No data recorded Who Could Verify You Are Able To Have These Secured: No data recorded Do You Have any Outstanding Charges, Pending  Court Dates, Parole/Probation? No data recorded Contacted To Inform of Risk of Harm To Self or Others: No data recorded   Does Patient Present under Involuntary Commitment? No data recorded IVC Papers Initial File Date: No data recorded  [place] of Residence: No data recorded  Patient Currently Receiving the Following Services: No data recorded  Determination of Need: Emergent (2 hours)   Options For Referral: Inpatient Hospitalization; Outpatient Therapy; Medication Management     CCA Biopsychosocial Patient Reported Schizophrenia/Schizoaffective Diagnosis in Past: No   Strengths: Asking for help   Mental Health Symptoms Depression:   Difficulty Concentrating; Change in energy/activity; Worthlessness; Hopelessness; Increase/decrease in appetite; Sleep (too much or little); Weight gain/loss   Duration of Depressive symptoms:  Duration of Depressive Symptoms: Greater than two weeks   Mania:   None   Anxiety:    Tension; Worrying   Psychosis:   Hallucinations   Duration of Psychotic symptoms:  Duration of Psychotic Symptoms: Less than six months   Trauma:   None   Obsessions:   None   Compulsions:   None   Inattention:   None   Hyperactivity/Impulsivity:   None   Oppositional/Defiant Behaviors:   None   Emotional  Irregularity:   Chronic feelings of emptiness; Recurrent suicidal behaviors/gestures/threats   Other Mood/Personality Symptoms:   Depressed    Mental Status Exam Appearance and self-care  Stature:   Average   Weight:   Average weight   Clothing:   Casual   Grooming:   Normal   Cosmetic use:   None   Posture/gait:   Normal   Motor activity:   Slowed   Sensorium  Attention:   Normal   Concentration:   Normal   Orientation:   Object; Person; Place; Situation   Recall/memory:   Normal   Affect and Mood  Affect:   Depressed   Mood:   Depressed   Relating  Eye contact:   Normal   Facial expression:    Responsive; Sad   Attitude toward examiner:   Cooperative   Thought and Language  Speech flow:  Clear and Coherent   Thought content:   Appropriate to Mood and Circumstances   Preoccupation:   None   Hallucinations:   Visual   Organization:  No data recorded  [employer] of Knowledge:   Good   Intelligence:   Average   Abstraction:   Normal   Judgement:   Poor   Reality Testing:   Realistic   Insight:   Good   Decision Making:   Impulsive   Social Functioning  Social Maturity:   Isolates   Social Judgement:   Victimized   Stress  Stressors:   Housing; Illness; Transitions   Coping Ability:   Overwhelmed; Exhausted   Skill Deficits:   Decision making; Self-care; Interpersonal   Supports:   Support needed     Religion: Religion/Spirituality Are You A Religious Person?: Yes What is Your Religious Affiliation?: Baptist How Might This Affect Treatment?: UTA  Leisure/Recreation: Leisure / Recreation Do You Have Hobbies?: Yes Leisure and Hobbies: watching TV  Exercise/Diet: Exercise/Diet Do You Exercise?: Yes What Type of Exercise Do You Do?: Run/Walk How Many Times a Week Do You Exercise?: 1-3 times a week Have You Gained or Lost A Significant Amount of Weight in the Past Six Months?: Yes-Lost Number of Pounds Lost?: 15 (Pt reports lost 15 lbs within three months) Do You Follow a Special Diet?: No Do You Have Any Trouble Sleeping?: Yes Explanation of Sleeping Difficulties: Pt reports sleeping two or three hours during the night.   CCA Employment/Education Employment/Work Situation: Employment / Work Technical sales engineer: On disability Why is Patient on Disability: Leg amputation How Long has Patient Been on Disability: UTA Patient's Job has Been Impacted by Current Illness:  (UTA) Has Patient ever Been in the [employer]?: No  Education: Education Is Patient Currently Attending School?: No Last Grade  Completed: 12 Did You Attend College?: No Did You Have An Individualized Education Program (IIEP): No Did You Have Any Difficulty At School?: No Patient's Education Has Been Impacted by Current Illness: No   CCA Family/Childhood History Family and Relationship History: Family history Marital status: Divorced Divorced, when?: UTA What types of issues is patient dealing with in the relationship?: Pt reports has a friendship with exwife. Additional relationship information: UTA Does patient have children?: Yes How many children?: 1 How is patient's relationship with their children?: close  Childhood History:  Childhood History By whom was/is the patient raised?: Mother Did patient suffer any verbal/emotional/physical/sexual abuse as a child?: No Did patient suffer from severe childhood neglect?: No Has patient ever been sexually abused/assaulted/raped as an adolescent or adult?: No Was the  patient ever a victim of a crime or a disaster?: No Witnessed domestic violence?: No Has patient been affected by domestic violence as an adult?: No  Child/Adolescent Assessment:     CCA Substance Use Alcohol/Drug Use: Alcohol / Drug Use Pain Medications: See MRA Prescriptions: See MRA Over the Counter: See MRA History of alcohol / drug use?: Yes Longest period of sobriety (when/how long): Pt reports fifteen years of sobriety Negative Consequences of Use: Personal relationships Withdrawal Symptoms: Agitation Substance #1 Name of Substance 1: Crack Cocaine 1 - Age of First Use: UTA 1 - Amount (size/oz): UTA 1 - Frequency: UTA 1 - Duration: UTA 1 - Last Use / Amount: Pt reports 15 years sobreity 1 - Method of Aquiring: UTA 1- Route of Use: UTA                       ASAM's:  Six Dimensions of Multidimensional Assessment  Dimension 1:  Acute Intoxication and/or Withdrawal Potential:   Dimension 1:  Description of individual's past and current experiences of substance use  and withdrawal: Pt reports smoking crack cocaine fifteen years.  Pt reports 15 years of sobreity.  Dimension 2:  Biomedical Conditions and Complications:   Dimension 2:  Description of patient's biomedical conditions and  complications: leg Amputation  Dimension 3:  Emotional, Behavioral, or Cognitive Conditions and Complications:  Dimension 3:  Description of emotional, behavioral, or cognitive conditions and complications: Pt reports feelings depressed; also reports isolation  Dimension 4:  Readiness to Change:  Dimension 4:  Description of Readiness to Change criteria: Pt reports 15 years of sobreity  Dimension 5:  Relapse, Continued use, or Continued Problem Potential:  Dimension 5:  Relapse, continued use, or continued problem potential critiera description: Maintance  Dimension 6:  Recovery/Living Environment:  Dimension 6:  Recovery/Iiving environment criteria description: Pt reports unsafe housing condition, "I need my own house"  ASAM Severity Score: ASAM's Severity Rating Score: 9  ASAM Recommended Level of Treatment: ASAM Recommended Level of Treatment:  (UTA)   Substance use Disorder (SUD) Substance Use Disorder (SUD)  Checklist Symptoms of Substance Use:  (UTA)  Recommendations for Services/Supports/Treatments: Recommendations for Services/Supports/Treatments Recommendations For Services/Supports/Treatments: Inpatient Hospitalization  Discharge Disposition:    DSM5 Diagnoses: Patient Active Problem List   Diagnosis Date Noted   CAP (community acquired pneumonia) 12/09/2021   Chronic hyponatremia 12/09/2021   Type 2 diabetes mellitus with hyperglycemia (HCC) 12/09/2021   Tachycardia 12/09/2021   AKI (acute kidney injury) (HCC) 10/04/2021   Cellulitis 10/03/2021   Acquired absence of left leg below knee (HCC) 04/05/2021   Left foot infection 03/17/2021   Pain in left ankle and joints of left foot    Back abscess 03/03/2021   Osteomyelitis of great toe of left foot (HCC)  02/03/2021   Uncontrolled type 2 diabetes mellitus with hyperglycemia (HCC) 02/03/2021   Essential hypertension 02/03/2021   Lactic acidosis 02/03/2021   Diabetic peripheral neuropathy associated with type 2 diabetes mellitus (HCC) 02/03/2021   Osteomyelitis (HCC) 02/03/2021     Referrals to Alternative Service(s): Referred to Alternative Service(s):   Place:   Date:   Time:    Referred to Alternative Service(s):   Place:   Date:   Time:    Referred to Alternative Service(s):   Place:   Date:   Time:    Referred to Alternative Service(s):   Place:   Date:   Time:     Meryle Ready, Counselor

## 2022-05-22 NOTE — ED Notes (Signed)
Pt requesting at home oxy and flexeril for his leg pain. [place] PA made aware

## 2022-05-22 NOTE — ED Notes (Signed)
When writer went to retrieve pt belongings so he can be transported to [place] it was found to have a hammer in his personal belongings. Lewie Chamber was kept by security

## 2022-05-22 NOTE — ED Notes (Signed)
[facility] called to take pt to [place] ed for treatment

## 2022-05-22 NOTE — ED Provider Notes (Signed)
Behavioral Health Urgent Care Medical Screening Exam  Patient Name: Torren Maffeo MRN: [id number] Date of Evaluation: 05/22/22 Chief Complaint:   Diagnosis:  Final diagnoses:  Suicidal ideation   History of Present illness: Bryceson Grape is a 61 y.o. male. Pt presents voluntarily to [facility] behavioral health for walk-in assessment.  Pt is assessed face-to-face by nurse practitioner.   Jake Church, 61 y.o., male patient seen face to face by this provider, consulted with Dr. Lucianne Muss; and chart reviewed on 05/22/22. Per chart review, hx of GERD, HTN, DM, Neuropathy, Stroke. 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 On evaluation Pratyush Ammon reports "I don't care if I live or die". He reports plan to overdose on sleep medication w/ intent to act on plan. He is unable to verbally contract to safety. He reports depression and SI have been worsening for the past 2 to 3 months. He reports poor support system, states he does not like his housing situation, and reports health concerns. When asked how writer can help him today, he reports "I don't know". Pt reports poor appetite, w/ loss of 10 lbs in the past month. He reports poor sleep, sleeping 2 to 3 hours/night. He denies current AVH, although reports he sees "shadow people" daily. He states he last saw "shadow people" last night. He denies AH. Pt denies hx of NSSI. He reports hx of 1 SA 13 to 15 years ago, attempted OD on Xanax. He reports 1 inpatient psychiatric hospitalization following this OD at [facility]. He denies VI/HI. He denies alcohol, marijuana, crack/cocaine, other SU. He reports positive family psychiatric hx. He states his mother carried dx of Schizophrenia. He states she passed away from brain cancer in 2008. He reports taking multiple medical medications he cannot recall the names of. He states they are in blister packs and he takes them once/week when he can remember when to take them. He denies he is on probation  or that he has any pending court dates/charges. He reports he is living with "a lady and her son". He states the lady is "mean" and the son "sits on the couch every day". He denies he is receiving counseling or psychiatric medication management. He is receiving disability, receives monthly check for $1600. He is not currently employed. He reports highest level of education is HSD. He denies access to a firearm or other weapon.  During evaluation Armanie Martine is a&ox3, sitting, casually dressed, fairly groomed, appropriate for environment. He is non-toxic appearing and in no acute distress. He makes good eye contact. Speech is clear and coherent, w/ nml rate and volume. Reported mood is depressed. Affect is flat. TP is coherent, goal directed, linear. Description of associations is intact. TC is logical. There is no evidence he is responding to internal stimuli. There is no evidence of agitation, aggression or distractibility. No delusions or paranoia elicited. Pt is calm, cooperative, pleasant. He answers questions appropriately.  Discussed w/ pt recommendation for inpatient admission. Pt is in agreement w/ recommendation.   Psychiatric Specialty Exam  Presentation  General Appearance:Appropriate for Environment; Fairly Groomed; Casual  Eye Contact:Good  Speech:Clear and Coherent; Normal Rate  Speech Volume:Normal  Handedness:No data recorded  Mood and Affect  Mood:Depressed  Affect:Flat   Thought Process  Thought Processes:Coherent; Goal Directed; Linear  Descriptions of Associations:Intact  Orientation:Full (Time, Place and Person)  Thought Content:Logical  Diagnosis of Schizophrenia or Schizoaffective disorder in past: No  Duration of Psychotic Symptoms: Less than six months  Hallucinations:None  Ideas of Reference:None  Suicidal Thoughts:Yes, Active With Plan; With Intent  Homicidal Thoughts:No   Sensorium  Memory:Immediate  Good  Judgment:Intact  Insight:Fair   Executive Functions  Concentration:Fair  Attention Span:Fair  Recall:Fair  Fund of Knowledge:Fair  Language:Fair   Psychomotor Activity  Psychomotor Activity:Normal   Assets  Assets:Communication Skills; Desire for Improvement; Housing; Financial Resources/Insurance   Sleep  Sleep:Poor  Number of hours: No data recorded  No data recorded  Physical Exam: Physical Exam Cardiovascular:     Rate and Rhythm: Normal rate.  Pulmonary:     Effort: Pulmonary effort is normal.  Neurological:     Mental Status: He is alert and oriented to person, place, and time.  Psychiatric:        Attention and Perception: Attention and perception normal.        Mood and Affect: Mood is depressed. Affect is flat.        Speech: Speech normal.        Behavior: Behavior normal. Behavior is cooperative.        Thought Content: Thought content includes suicidal ideation. Thought content includes suicidal plan.    Review of Systems  Constitutional:  Negative for chills and fever.  Respiratory:  Negative for shortness of breath.   Cardiovascular:  Negative for chest pain and palpitations.  Gastrointestinal:  Negative for abdominal pain.  Neurological:  Negative for dizziness and headaches.  Psychiatric/Behavioral:  Positive for depression and suicidal ideas.    Blood pressure (!) 137/95, pulse 93, temperature 98.1 F (36.7 C), resp. rate 18, SpO2 100 %. There is no height or weight on file to calculate BMI.  Musculoskeletal: Strength & Muscle Tone: within normal limits Gait & Station: normal Patient leans: N/A  [facility]UC MSE Discharge Disposition for Follow up and Recommendations: Pt will be transferred to [facility] while awaiting inpatient psychiatric placement. Pt inappropriate to return to [facility] at this time due to exclusionary criteria of prosthetic device. Writer will reach out to begin process of seeking inpatient psychiatric placement. Recommend  TTS consult to follow up with placement.  Pt will likely require medical clearance prior to placement. Recommended labs: -CBC w/ differential/platelet -CMP -Hemoglobin A1c -Lipid panel -TSH -POCT Urine Drug Screen - (I-Screen) -Resp Panel by RT-PCR (Flu A&B, Covid) Anterior Nasal Swab -POC  SARS CORONAVIRUS 2AG Nasal Swab  Lauree Chandler, NP 05/22/2022, 1:51 PM

## 2022-05-22 NOTE — ED Notes (Signed)
Pt requested to take a shower. Pt given supplies and used walker to ambulate to shower.

## 2022-05-22 NOTE — BH Assessment (Signed)
Patient currently under review for inpatient psychiatric treatment at [facility].

## 2022-05-22 NOTE — BH Assessment (Addendum)
Pt had a scheduled appointment at [facility] today. During his appointment pt informed provider that he was suicidal. Pt reports SI with plan to OD. Pt bought a bottle of tylenol PM and was planning to take them all. Pt states "I don't care if I live or die. I'm ready to die". Pt reports stressors of having one leg, feeling lonely and his living conditions. Pt denies HI, AVH and substance use.

## 2022-05-22 NOTE — BH Assessment (Addendum)
Patient meets criteria for inpatient psychiatric treatment, per provider Gaspar Cola, NP, Nedra Hai).   She has requested the [facility] Debbe Bales, RN) to review patient for a bed admission to [facility]. Brook, confirms that [facility] doesn't have an appropriate bed for this patient.    Referrals have been faxed out to other hospitals for consideration of bed placement. See hospitals listed below.   Destination Service Provider Request Status Selected Services Address Phone Fax Patient Preferred  [facility]  Pending - Request Sent N/A [address], [place] [address] [phone] --  [facility]  Pending - Request Sent N/A [address] [place] [address] [phone] --  [facility]  Pending - Request Sent N/A [address] [place] [address] [phone] --  [facility]  Pending - Request Sent N/A [address] [place], [place] [address] [phone] --  [facility]  Pending - Request Sent N/A [facility] [address] [place] [address] [phone] --  [facility]  Pending - Request Sent N/A [phone] [place] [address] [phone] --  [facility]  Pending - Request Sent N/A [address] [place], [place] [address] [phone] --  [facility]  Pending - Request Sent N/A [address] [place] [address] [phone] --  [facility]  Pending - Request Sent N/A [address] [place] [address] [phone] --  [facility]  Pending - Request Sent N/A [address], [place] [address] [phone] --  [facility]  Pending - Request Sent N/A [address] [place] [address] [phone] --  [facility]  Pending - Request Sent N/A [address], [place] [address] [phone] --  [facility]  Pending - Request Sent N/A [address], [place] [address] [phone] --  [facility]  Pending - Request Sent N/A [address], [place] [address] [phone] --  [facility]  Pending - Request Sent N/A [name] [place] [address] [phone] --  [facility]  Pending - Request Sent N/A [address] [place] [address] [phone] --  [facility]  Pending - Request Sent N/A [address] [place] [address] [phone] --  [facility]  Pending - Request Sent N/A [address], [place] [address] [phone] --  [facility]  Pending - Request Sent N/A [address] [place] [address] [phone] --  [facility]  Pending - Request Sent N/A [address] [place], [place] [address] [phone] --

## 2022-05-22 NOTE — ED Notes (Signed)
Pt belongings placed in locker number 3 

## 2022-05-22 NOTE — ED Notes (Signed)
Report was called to [email or web address] ED

## 2022-05-22 NOTE — ED Triage Notes (Signed)
Pt arrived stating he went to a mental hospital today and they did not have a bed so they sent him here. Pt has a lot of outside stressors and factors going on and states he just does not care if he lives or dies. Pt states he is extremely depressed but does not actually want to hurt himself.

## 2022-05-22 NOTE — ED Provider Notes (Signed)
[facility] EMERGENCY DEPARTMENT Provider Note   CSN: [id number] Arrival date & time: 05/22/22  1428     History  Chief Complaint  Patient presents with   Psychiatric Evaluation    Brett White is a 61 y.o. male with a past medical history of uncontrolled type 2 diabetes, back pain, osteomyelitis and left AKA presenting today from [facility].  Patient went to the clinic today complaining of passive SI.  He was ultimately sent here because they do not have any space.  He has been recommended for inpatient treatment so we are waiting placement.  He needed medical clearance.  He tells me that he has no active SI/HI or auditory hallucinations.  Does say that he "sees shadow people."  Complaining of back pain which is chronic.  Reports taking oxycodone for his back pain.  HPI     Home Medications Prior to Admission medications   Medication Sig Start Date End Date Taking? Authorizing Provider  amLODipine (NORVASC) 10 MG tablet Take 10 mg by mouth every morning. 05/11/22   [provider]  aspirin 81 MG chewable tablet Chew 1 tablet (81 mg total) by mouth daily. Patient not taking: Reported on 12/10/2021 10/20/21   Maretta Bees, MD  atorvastatin (LIPITOR) 20 MG tablet Take 20 mg by mouth at bedtime. 11/07/21   [provider]  cholecalciferol (VITAMIN D3) 25 MCG (1000 UNIT) tablet Take 1,000 Units by mouth at bedtime. Patient not taking: Reported on 05/22/2022 04/05/22   [provider]  cyclobenzaprine (FLEXERIL) 5 MG tablet Take 5 mg by mouth 3 (three) times daily as needed for muscle spasms. 02/07/22   [provider]  DULoxetine (CYMBALTA) 30 MG capsule Take 1 capsule (30 mg total) by mouth daily. Patient not taking: Reported on 02/16/2022 10/20/21   Maretta Bees, MD  FARXIGA 10 MG TABS tablet Take 10 mg by mouth every morning. Patient not taking: Reported on 05/22/2022 02/07/22   [provider]  fluticasone (FLONASE) 50  MCG/ACT nasal spray Place 1 spray into both nostrils daily. Patient not taking: Reported on 05/22/2022 12/15/21   Lanae Boast, MD  fluticasone-salmeterol (ADVAIR HFA) [phone] MCG/ACT inhaler Inhale 2 puffs into the lungs 2 (two) times daily. Patient not taking: Reported on 05/22/2022 12/15/21   Janeann Forehand D, NP  furosemide (LASIX) 20 MG tablet Take 20 mg by mouth every morning. Patient not taking: Reported on 05/22/2022 05/11/22   [provider]  gabapentin (NEURONTIN) 300 MG capsule Take 300 mg by mouth 2 (two) times daily. Patient not taking: Reported on 05/22/2022 12/04/21   [provider]  glipiZIDE (GLUCOTROL) 10 MG tablet Take 10 mg by mouth every morning. 05/11/22   [provider]  Insulin Pen Needle 32G X 4 MM MISC Use to inject insulin up to 4 times daily as needed. 10/20/21   Ghimire, Werner Lean, MD  losartan (COZAAR) 50 MG tablet Take 1 tablet (50 mg total) by mouth daily. Patient not taking: Reported on 05/22/2022 02/24/22   Marinda Elk, MD  metFORMIN (GLUCOPHAGE) 1000 MG tablet Take 1,000 mg by mouth 2 (two) times daily with a meal.    [provider]  Oxycodone HCl 10 MG TABS Take 10 mg by mouth every 6 (six) hours as needed (For pain). 04/30/22   [provider]  OZEMPIC, 2 MG/DOSE, 8 MG/3ML SOPN Inject 2 mg into the skin every Wednesday. 02/07/22   [provider]  pantoprazole (PROTONIX) 40 MG tablet Take  1 tablet (40 mg total) by mouth daily. Patient not taking: Reported on 02/16/2022 10/20/21   Maretta Bees, MD  pregabalin (LYRICA) 100 MG capsule Take 100 mg by mouth 3 (three) times daily. 02/03/22   [provider]      Allergies    Patient has no known allergies.    Review of Systems   Review of Systems  Physical Exam Updated Vital Signs BP (!) 156/88 (BP Location: Right Arm)   Pulse 84   Temp 98.4 F (36.9 C) (Oral)   Resp 16   Ht 6\' 2"  (1.88 m)   Wt 95.3 kg   SpO2 95%   BMI 26.96 kg/m  Physical  Exam Vitals and nursing note reviewed.  Constitutional:      Appearance: Normal appearance.  HENT:     Head: Normocephalic and atraumatic.  Eyes:     General: No scleral icterus.    Conjunctiva/sclera: Conjunctivae normal.  Pulmonary:     Effort: Pulmonary effort is normal. No respiratory distress.  Skin:    Findings: No rash.  Neurological:     Mental Status: He is alert.  Psychiatric:        Mood and Affect: Mood normal.     ED Results / Procedures / Treatments   Labs (all labs ordered are listed, but only abnormal results are displayed) Labs Reviewed  COMPREHENSIVE METABOLIC PANEL - Abnormal; Notable for the following components:      Result Value   CO2 21 (*)    Glucose, Bld 294 (*)    BUN 26 (*)    Creatinine, Ser 1.29 (*)    All other components within normal limits  RESP PANEL BY RT-PCR (FLU A&B, COVID) ARPGX2  ETHANOL  CBC WITH DIFFERENTIAL/PLATELET  RAPID URINE DRUG SCREEN, HOSP PERFORMED    EKG None  Radiology No results found.  Procedures Procedures   Medications Ordered in ED Medications  oxyCODONE (Oxy IR/ROXICODONE) immediate release tablet 10 mg (10 mg Oral Given 05/22/22 1725)    ED Course/ Medical Decision Making/ A&P                           Medical Decision Making Risk Prescription drug management.   61 year old male presenting from behavioral health due to passive SI.  He has been evaluated by them and recommended for inpatient treatment however they do not have any beds so he was sent here.  Medical clearance is unremarkable.  Clinically sober and stable to await placement.  Per previous chart he is voluntary and I do not see any indication for IVC at this time either.  Oxycodone ordered for pain and patient is medically cleared. Final Clinical Impression(s) / ED Diagnoses Final diagnoses:  Depression, unspecified depression type  Visual hallucination    Rx / DC Orders Awaiting behavioral placement   46,  PA-C 05/22/22 1849    05/24/22, MD 05/23/22 [phone]

## 2022-05-22 NOTE — ED Notes (Signed)
This RN introduced self to pt. Pt pleasant, calm and cooperative, eating dinner. No needs reported at this time

## 2022-05-22 NOTE — ED Provider Triage Note (Signed)
Emergency Medicine Provider Triage Evaluation Note  Mohamedamin Nifong , a 61 y.o. male  was evaluated in triage.  Pt complains of not caring if he lives or dies.  Seen at behavioral health, sent to ED because did not have beds.  Denies any active SI with plans, HI with plan or hallucinations.  No illicit drug use or alcohol consumption prior to arrival per patient..  Review of Systems  Per HPI  Physical Exam  BP (!) 160/105 (BP Location: Right Arm)   Pulse (!) 102   Temp 99.2 F (37.3 C) (Oral)   Resp 16   Ht 6\' 2"  (1.88 m)   Wt 95.3 kg   SpO2 99%   BMI 26.96 kg/m  Gen:   Awake, no distress   Resp:  Normal effort  MSK:   Moves extremities without difficulty  Other:    Medical Decision Making  Medically screening exam initiated at 2:58 PM.  Appropriate orders placed.  Artice Bergerson was informed that the remainder of the evaluation will be completed by another provider, this initial triage assessment does not replace that evaluation, and the importance of remaining in the ED until their evaluation is complete.  Voluntary    Jake Church, Theron Arista 05/22/22 1458

## 2022-05-23 DIAGNOSIS — F4321 Adjustment disorder with depressed mood: Secondary | ICD-10-CM

## 2022-05-23 NOTE — ED Notes (Signed)
Breakfast order placed ?

## 2022-05-23 NOTE — BH Assessment (Signed)
[facility] Assessment Progress Note   Per Ophelia Shoulder, NP, this pt does not require psychiatric hospitalization at this time.  Pt is psychiatrically cleared.  Discharge instructions include referrals for several area outpatient providers.  EDP Cathren Laine, MD and pt's nurse, Annabelle Harman, have been notified.  Doylene Canning, MA Triage Specialist [phone]

## 2022-05-23 NOTE — ED Provider Notes (Addendum)
Emergency Medicine Observation Re-evaluation Note  Brett White is a 61 y.o. male, seen on rounds today.  Pt initially presented to the ED for complaints of Psychiatric Evaluation Currently, the patient reports feeling improved. Reports sleeping ok last night, and good appetite. States does not feel would benefit from inpatient psych stay, and prefers d/c with outpatient resources. Pt indicates has stable living situation and good support there.   Physical Exam  BP (!) 160/102 (BP Location: Right Arm)   Pulse 88   Temp 98.5 F (36.9 C)   Resp 17   Ht 1.88 m (6\' 2" )   Wt 95.3 kg   SpO2 96%   BMI 26.96 kg/m  Physical Exam General: calm, conversant. Cardiac: regular rate. Lungs: breathing comfortably. Psych:  mildly depressed mood. Is pleasant, conversant, thoughts clear, expresses self well. Denies thoughts of harm to self or others. No delusions, hallucinations or acute psychosis noted.   ED Course / MDM    I have reviewed the labs performed to date as well as medications administered while in observation.  Recent changes in the last 24 hours include ED obs, reassessment.   Plan    Brett White is not under involuntary commitment.  BH reassessment pending.  Pt states is hopeful for d/c to home today, and is agreeable to outpatient BH follow up.      Jake Church, MD 05/23/22 [phone]  [facility] team has reassessed and indicates psych clear for d/c.  Outpatient f/u resources provided by [facility] team.   Pt currently appears stable for d/c.  Also rec pcp f/u re htn.     [facility], MD 05/23/22 [phone]

## 2022-05-23 NOTE — Discharge Instructions (Addendum)
It was our pleasure to provide your ER care today - we hope that you feel better.For your behavioral health needs you are advised to follow up with outpatient psychiatry and therapy in the coming week.  Contact one of the providers listed below to schedule a new patient appointment;       [facility]      [address]      [place], [place] [address]      [phone]        [facility]      [phone] [address]      [place], [place] [address]      [phone]        [facility]      [address]      [place], [place] [address]      [phone]    For mental health issues and/or crisis, you may also go directly to the [facility] - they are open 24/7 and walk-ins are welcome.   For your high blood pressure, continue your meds, limit salt intake, follow hearth healthy meal plan, and follow up closely with primary care doctor in the coming week.  Return to ER if worse, new symptoms, fevers, chest pain, trouble breathing, or other emergency concern.

## 2022-05-23 NOTE — ED Notes (Signed)
Pt expressed that he wishes to leave tomorrow since he still does not have a room at [facility]. Denies SI/HI. Per [place] PA, he is free to leave if he wishes.

## 2022-05-23 NOTE — ED Notes (Signed)
ED Provider at bedside. 

## 2022-05-23 NOTE — ED Notes (Signed)
Pt expressed that he would like to stay here a couple more days rather than go to a facility. MD made aware.

## 2022-05-23 NOTE — ED Notes (Signed)
Introduced myself to pt. Pt denies any needs at this time.

## 2022-05-23 NOTE — Consult Note (Signed)
Telepsych Consultation   Reason for Consult:  Psychiatric Reassessment Referring Physician:  Darliss Ridgel Location of Patient:    [facility] Location of Provider: Other: virtual home office  Patient Identification: Bernardino Dowell MRN:  [id number] Principal Diagnosis: Adjustment disorder with depressed mood Diagnosis:  Principal Problem:   Adjustment disorder with depressed mood   Total Time spent with patient: 30 minutes  Subjective:   Verlan Grotz is a 61 y.o. male patient admitted with passive suicidal ideations.  HPI:   Patient seen via telepsych by this provider; chart reviewed and consulted with Dr. Dwyane Dee on 05/23/22.  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  States since then his sleep has been sporadic and he's been trying to adjust the his physical and medical changes.    On admission, he endorsed suicidal ideations, when asked this today he states he had suicidal ideations but no plan or intent, and goes on to say he would never do anything to end his life.  States he has an adult daughter whom he loves and he would not do that to her.  His daughter serves as a strong protective factor against self harm.  He is divorced but remains in touch with his ex wife, talks with her 2x weekly.  At present he denies suicidal ideations, no plan or intent and he's asking to go home.  States after spending the night in the emergency department and "resting good" he no longer feels his concerns as as overwhelming and certainly not worth ending is life.  Pt currently lives with a friend and her son.  He met her through his best friend and she allowed him to stay with her for the past year.  The only residential concerns he reports is that the environment is not handicapped accessible/equipped.  Otherwise he does not have concerns.   Pt states his primary care  doctor at [facility] is workimg with SW to get him to an assistive living facility.  States he has been accepted to one but he did not go as he did not like the envirnonment.  Pt denies hx for depression, anxiety, prior suicide attempts or inpatient psychiatric care.  He denies alcohol or polysubstance abuse.    On admissions, patient was evaluated and medically cleared.     During evaluation Hubbert Landrigan is sitting upright in hospital bed.  He is alert/oriented x 4; calm/cooperative; and mood congruent with affect.  Patient is speaking in a clear tone at moderate volume, and normal pace; with good eye contact.  His thought process is coherent and relevant; There is no indication that he is currently responding to internal/external stimuli or experiencing delusional thought content.  Patient denies suicidal/self-harm/homicidal ideation, psychosis, and paranoia.  Patient has remained calm throughout assessment and has answered questions appropriately.    Per ED Provider Admission Assessment 05/22/2022: Chief Complaint  Patient presents with   Psychiatric Evaluation      Hence Derrick is a 61 y.o. male with a past medical history of uncontrolled type 2 diabetes, back pain, osteomyelitis and left AKA presenting today from [facility].  Patient went to the clinic today complaining of passive SI.  He was ultimately sent here because they do not have any space.  He has been recommended for inpatient treatment so we are waiting placement.  He needed medical clearance.   He  tells me that he has no active SI/HI or auditory hallucinations.  Does say that he "sees shadow people."  Complaining of back pain which is chronic.  Reports taking oxycodone for his back pain.   Past Psychiatric History: Denies  Risk to Self:  no Risk to Others:  no Prior Inpatient Therapy: no  Prior Outpatient Therapy:  no  Past Medical History:  Past Medical History:  Diagnosis Date   GERD (gastroesophageal reflux  disease)    HTN (hypertension)    Insulin dependent type 2 diabetes mellitus ([place])    Neuropathy    Stroke [facility])     Past Surgical History:  Procedure Laterality Date   AMPUTATION Left 02/04/2021   Procedure: LEFT GREAT TOE AMPUTATION;  Surgeon: Newt Minion, MD;  Location: [place];  Service: Orthopedics;  Laterality: Left;   AMPUTATION Left 02/10/2021   Procedure: LEFT FOOT 1ST RAY AMPUTATION;  Surgeon: Newt Minion, MD;  Location: [place];  Service: Orthopedics;  Laterality: Left;   AMPUTATION Left 03/22/2021   Procedure: LEFT BELOW KNEE AMPUTATION;  Surgeon: Newt Minion, MD;  Location: [place];  Service: Orthopedics;  Laterality: Left;   APPENDECTOMY     BACK SURGERY     I & D EXTREMITY Left 03/03/2021   Procedure: LEFT FOOT DEBRIDEMENT;  Surgeon: Newt Minion, MD;  Location: [place];  Service: Orthopedics;  Laterality: Left;   INCISION AND DRAINAGE ABSCESS N/A 10/06/2021   Procedure: INCISION AND DRAINAGE BACK ABSCESS;  Surgeon: Clovis Riley, MD;  Location: [place] [facility];  Service: General;  Laterality: N/A;   removal of back cyst     TONSILLECTOMY     Family History:  Family History  Problem Relation Age of Onset   Heart attack Neg Hx    Family Psychiatric  History: unknown Social History:  Social History   Substance and Sexual Activity  Alcohol Use Not Currently     Social History   Substance and Sexual Activity  Drug Use Not Currently    Social History   Socioeconomic History   Marital status: Divorced    Spouse name: Not on file   Number of children: Not on file   Years of education: Not on file   Highest education level: Not on file  Occupational History   Not on file  Tobacco Use   Smoking status: Never   Smokeless tobacco: Never  Substance and Sexual Activity   Alcohol use: Not Currently   Drug use: Not Currently   Sexual activity: Not on file  Other Topics Concern   Not on file  Social History Narrative   Not on file   Social Determinants of  Health   Financial Resource Strain: Not on file  Food Insecurity: Not on file  Transportation Needs: Not on file  Physical Activity: Not on file  Stress: Not on file  Social Connections: Not on file   Additional Social History:    Allergies:  No Known Allergies  Labs:  Results for orders placed or performed during the hospital encounter of 05/22/22 (from the past 48 hour(s))  Resp Panel by RT-PCR (Flu A&B, Covid) Anterior Nasal Swab     Status: None   Collection Time: 05/22/22  2:58 PM   Specimen: Anterior Nasal Swab  Result Value Ref Range   SARS Coronavirus 2 by RT PCR NEGATIVE NEGATIVE    Comment: (NOTE) SARS-CoV-2 target nucleic acids are NOT DETECTED.  The SARS-CoV-2 RNA is generally detectable in upper respiratory  specimens during the acute phase of infection. The lowest concentration of SARS-CoV-2 viral copies this assay can detect is 138 copies/mL. A negative result does not preclude SARS-Cov-2 infection and should not be used as the sole basis for treatment or other patient management decisions. A negative result may occur with  improper specimen collection/handling, submission of specimen other than nasopharyngeal swab, presence of viral mutation(s) within the areas targeted by this assay, and inadequate number of viral copies(<138 copies/mL). A negative result must be combined with clinical observations, patient history, and epidemiological information. The expected result is Negative.  Fact Sheet for Patients:  EntrepreneurPulse.com.au  Fact Sheet for Healthcare Providers:  IncredibleEmployment.be  This test is no t yet approved or cleared by the [place] FDA and  has been authorized for detection and/or diagnosis of SARS-CoV-2 by FDA under an Emergency Use Authorization (EUA). This EUA will remain  in effect (meaning this test can be used) for the duration of the COVID-19 declaration under Section 564(b)(1) of the  Act, 21 U.S.C.section 360bbb-3(b)(1), unless the authorization is terminated  or revoked sooner.       Influenza A by PCR NEGATIVE NEGATIVE   Influenza B by PCR NEGATIVE NEGATIVE    Comment: (NOTE) The Xpert Xpress SARS-CoV-2/FLU/RSV plus assay is intended as an aid in the diagnosis of influenza from Nasopharyngeal swab specimens and should not be used as a sole basis for treatment. Nasal washings and aspirates are unacceptable for Xpert Xpress SARS-CoV-2/FLU/RSV testing.  Fact Sheet for Patients: EntrepreneurPulse.com.au  Fact Sheet for Healthcare Providers: IncredibleEmployment.be  This test is not yet approved or cleared by the [place] FDA and has been authorized for detection and/or diagnosis of SARS-CoV-2 by FDA under an Emergency Use Authorization (EUA). This EUA will remain in effect (meaning this test can be used) for the duration of the COVID-19 declaration under Section 564(b)(1) of the Act, 21 U.S.C. section 360bbb-3(b)(1), unless the authorization is terminated or revoked.  Performed at [facility], [place] [address], [place], [place] [address]   Comprehensive metabolic panel     Status: Abnormal   Collection Time: 05/22/22  3:00 PM  Result Value Ref Range   Sodium 137 135 - 145 mmol/L   Potassium 4.5 3.5 - 5.1 mmol/L   Chloride 104 98 - 111 mmol/L   CO2 21 (L) 22 - 32 mmol/L   Glucose, Bld 294 (H) 70 - 99 mg/dL    Comment: Glucose reference range applies only to samples taken after fasting for at least 8 hours.   BUN 26 (H) 6 - 20 mg/dL   Creatinine, Ser 1.29 (H) 0.61 - 1.24 mg/dL   Calcium 9.4 8.9 - 10.3 mg/dL   Total Protein 7.7 6.5 - 8.1 g/dL   Albumin 4.1 3.5 - 5.0 g/dL   AST 20 15 - 41 U/L   ALT 14 0 - 44 U/L   Alkaline Phosphatase 76 38 - 126 U/L   Total Bilirubin 0.6 0.3 - 1.2 mg/dL   GFR, Estimated >60 >60 mL/min    Comment: (NOTE) Calculated using the CKD-EPI Creatinine Equation (2021)    Anion  gap 12 5 - 15    Comment: Performed at [facility] [address], [place], [place] [address]  Ethanol     Status: None   Collection Time: 05/22/22  3:00 PM  Result Value Ref Range   Alcohol, Ethyl (B) <10 <10 mg/dL    Comment: (NOTE) Lowest detectable limit for serum alcohol is  10 mg/dL.  For medical purposes only. Performed at [facility], [place] [address], [place], [place] [address]   CBC with Diff     Status: None   Collection Time: 05/22/22  3:00 PM  Result Value Ref Range   WBC 8.8 4.0 - 10.5 K/uL   RBC 5.36 4.22 - 5.81 MIL/uL   Hemoglobin 15.0 13.0 - 17.0 g/dL   HCT 44.5 39.0 - 52.0 %   MCV 83.0 80.0 - 100.0 fL   MCH 28.0 26.0 - 34.0 pg   MCHC 33.7 30.0 - 36.0 g/dL   RDW 12.9 11.5 - 15.5 %   Platelets 307 150 - 400 K/uL   nRBC 0.0 0.0 - 0.2 %   Neutrophils Relative % 72 %   Neutro Abs 6.4 1.7 - 7.7 K/uL   Lymphocytes Relative 20 %   Lymphs Abs 1.8 0.7 - 4.0 K/uL   Monocytes Relative 5 %   Monocytes Absolute 0.5 0.1 - 1.0 K/uL   Eosinophils Relative 1 %   Eosinophils Absolute 0.1 0.0 - 0.5 K/uL   Basophils Relative 1 %   Basophils Absolute 0.1 0.0 - 0.1 K/uL   Immature Granulocytes 1 %   Abs Immature Granulocytes 0.04 0.00 - 0.07 K/uL    Comment: Performed at [facility], [address], [place], [place] [address]  Urine rapid drug screen (hosp performed)     Status: None   Collection Time: 05/22/22  7:50 PM  Result Value Ref Range   Opiates NONE DETECTED NONE DETECTED   Cocaine NONE DETECTED NONE DETECTED   Benzodiazepines NONE DETECTED NONE DETECTED   Amphetamines NONE DETECTED NONE DETECTED   Tetrahydrocannabinol NONE DETECTED NONE DETECTED   Barbiturates NONE DETECTED NONE DETECTED    Comment: (NOTE) DRUG SCREEN FOR MEDICAL PURPOSES ONLY.  IF CONFIRMATION IS NEEDED FOR ANY PURPOSE, NOTIFY LAB WITHIN 5 DAYS.  LOWEST DETECTABLE LIMITS FOR URINE DRUG SCREEN Drug Class                     Cutoff (ng/mL) Amphetamine and metabolites     1000 Barbiturate and metabolites    200 Benzodiazepine                 838 Tricyclics and metabolites     300 Opiates and metabolites        300 Cocaine and metabolites        300 THC                            50 Performed at [facility], [place] [address] [place], [place] [address]     Medications:  No current facility-administered medications for this encounter.   Current Outpatient Medications  Medication Sig Dispense Refill   amLODipine (NORVASC) 10 MG tablet Take 10 mg by mouth every morning.     atorvastatin (LIPITOR) 20 MG tablet Take 20 mg by mouth at bedtime.     cyclobenzaprine (FLEXERIL) 5 MG tablet Take 5 mg by mouth 3 (three) times daily as needed for muscle spasms.     DULoxetine (CYMBALTA) 30 MG capsule Take 1 capsule (30 mg total) by mouth daily. 30 capsule 3   FARXIGA 10 MG TABS tablet Take 10 mg by mouth every morning.     furosemide (LASIX) 20 MG tablet Take 20 mg by mouth every morning.     glipiZIDE (GLUCOTROL) 10 MG tablet Take 10  mg by mouth every morning.     losartan (COZAAR) 50 MG tablet Take 1 tablet (50 mg total) by mouth daily.     metFORMIN (GLUCOPHAGE) 1000 MG tablet Take 1,000 mg by mouth 2 (two) times daily with a meal.     pantoprazole (PROTONIX) 40 MG tablet Take 1 tablet (40 mg total) by mouth daily. 30 tablet 3   pregabalin (LYRICA) 100 MG capsule Take 100 mg by mouth 3 (three) times daily.     aspirin 81 MG chewable tablet Chew 1 tablet (81 mg total) by mouth daily. (Patient not taking: Reported on 12/10/2021) 30 tablet 3   cholecalciferol (VITAMIN D3) 25 MCG (1000 UNIT) tablet Take 1,000 Units by mouth at bedtime. (Patient not taking: Reported on 05/22/2022)     fluticasone (FLONASE) 50 MCG/ACT nasal spray Place 1 spray into both nostrils daily. (Patient not taking: Reported on 05/22/2022)  2   fluticasone-salmeterol (ADVAIR HFA) 45-21 MCG/ACT inhaler Inhale 2 puffs into the lungs 2 (two) times daily. (Patient not taking: Reported on 05/22/2022)  1 each 1   gabapentin (NEURONTIN) 300 MG capsule Take 300 mg by mouth 2 (two) times daily. (Patient not taking: Reported on 05/22/2022)     Insulin Pen Needle 32G X 4 MM MISC Use to inject insulin up to 4 times daily as needed. 100 each 0   Oxycodone HCl 10 MG TABS Take 10 mg by mouth every 6 (six) hours as needed (For pain). (Patient not taking: Reported on 05/22/2022)     OZEMPIC, 2 MG/DOSE, 8 MG/3ML SOPN Inject 2 mg into the skin every Wednesday. (Patient not taking: Reported on 05/22/2022)      Musculoskeletal: pt has left BTK amputation but otherwise moves all extremities and ambulates independently.  Strength & Muscle Tone: within normal limits Gait & Station: normal Patient leans: N/A    Psychiatric Specialty Exam:  Presentation  General Appearance: Appropriate for Environment; Casual; Fairly Groomed (has hospital scrub pants on but is not wearing scrub top, states it's warm in his room)  Eye Contact:Good  Speech:Clear and Coherent  Speech Volume:Normal  Handedness:Right  Mood and Affect  Mood:Anxious  Affect:Appropriate; Congruent   Thought Process  Thought Processes:Coherent; Goal Directed  Descriptions of Associations:Intact  Orientation:Full (Time, Place and Person)  Thought Content:Logical (has improved since admission)  History of Schizophrenia/Schizoaffective disorder:No  Duration of Psychotic Symptoms:N/A  Hallucinations:Hallucinations: None  Ideas of Reference:None  Suicidal Thoughts:Suicidal Thoughts: No (had passive SI on admissions but states this has cleared up) SI Active Intent and/or Plan: With Plan; With Intent  Homicidal Thoughts:Homicidal Thoughts: No   Sensorium  Memory:Immediate Good; Recent Good; Remote Good  Judgment:Good  Insight:Good   Executive Functions  Concentration:Good  Attention Span:Good  [place] of Knowledge:Good  Language:Good   Psychomotor Activity  Psychomotor Activity:Psychomotor Activity:  Normal   Assets  Assets:Communication Skills; Financial Resources/Insurance; Housing; Social Support; Resilience   Sleep  Sleep:Sleep: Good Number of Hours of Sleep: 8    Physical Exam: Physical Exam Constitutional:      Appearance: Normal appearance.  Cardiovascular:     Rate and Rhythm: Normal rate.  Pulmonary:     Effort: Pulmonary effort is normal.  Musculoskeletal:        General: Normal range of motion.     Cervical back: Normal range of motion.  Neurological:     General: No focal deficit present.     Mental Status: He is alert and oriented to person, place, and time. Mental status  is at baseline.  Psychiatric:        Attention and Perception: Attention and perception normal.        Mood and Affect: Affect normal. Mood is anxious.        Speech: Speech normal.        Behavior: Behavior normal. Behavior is not agitated, slowed, aggressive, withdrawn, hyperactive or combative. Behavior is cooperative.        Thought Content: Thought content normal. Thought content does not include suicidal ideation. Thought content does not include suicidal plan.        Cognition and Memory: Cognition normal.        Judgment: Judgment normal.    Review of Systems  Constitutional: Negative.   HENT: Negative.    Eyes: Negative.   Respiratory: Negative.    Cardiovascular: Negative.   Gastrointestinal: Negative.   Genitourinary: Negative.   Skin: Negative.   Neurological: Negative.   Endo/Heme/Allergies: Negative.   Psychiatric/Behavioral: Negative.     Blood pressure (!) 148/86, pulse 78, temperature 98.6 F (37 C), resp. rate 18, height 6' 2"  (1.88 m), weight 95.3 kg, SpO2 96 %. Body mass index is 26.96 kg/m.  Treatment Plan Summary: Pt denies SI, HI, no AVH and contracts for safety.  Pt is clear and coherent, does not appear to be responding to internal stimulus and denies AVH.  He's currently going through an adjustment period related to chronic medical comorbidities but he  has strong familial protective factors against self harm.  Pt is also future oriented and continues to work with outpatient SW to secure a placement at an assistive living facility.    He is not followed by outpatient psychiatry to agrees to referral for medication evaluation and therapy to gain more coping skills.   Plan- As per above assessment, there are no current grounds for involuntary commitment at this time.  Patient is not currently interested in inpatient services but expresses agreement to continue outpatient treatment.  Disposition: No evidence of imminent risk to self or others at present.   Patient does not meet criteria for psychiatric inpatient admission. Supportive therapy provided about ongoing stressors. Discussed crisis plan, support from social network, calling 911, coming to the Emergency Department, and calling Suicide Hotline.  SW to add resources for outpatient psychiatry for med management and therapy.    Spoke with Dr. Lajean Saver, ED Provider; Jalene Mullet, LCSW and all were informed of above recommendation and disposition via secure chat.   This service was provided via telemedicine using a 2-way, interactive audio and video technology.  Names of all persons participating in this telemedicine service and their role in this encounter. Name: Kmari Halter Role: Patient  Name: Merlyn Lot Role: PMHNP    Mallie Darting, NP 05/23/2022 7:13 PM

## 2022-05-23 NOTE — ED Notes (Signed)
Pt is low SI risk and no physical sitter available. Ordered tele camera and portable to deliver.  Pt door open and another sitter is watching him as well until it arrives to bedside.  Primary RN aware to call to activate camera on arrival.

## 2022-05-23 NOTE — ED Notes (Signed)
Review of IVC Docs in [facility] chart reflects expiration of 05/27/22.

## 2022-06-06 ENCOUNTER — Encounter (HOSPITAL_COMMUNITY): Payer: Self-pay

## 2022-06-06 ENCOUNTER — Emergency Department (HOSPITAL_COMMUNITY)
Admission: EM | Admit: 2022-06-06 | Discharge: 2022-06-07 | Disposition: A | Discharge status: Home / Self Care | Payer: Medicare Other | Attending: Emergency Medicine | Admitting: Emergency Medicine

## 2022-06-06 ENCOUNTER — Other Ambulatory Visit: Payer: Self-pay

## 2022-06-06 DIAGNOSIS — S90821A Blister (nonthermal), right foot, initial encounter: Secondary | ICD-10-CM | POA: Diagnosis not present

## 2022-06-06 DIAGNOSIS — X58XXXA Exposure to other specified factors, initial encounter: Secondary | ICD-10-CM | POA: Insufficient documentation

## 2022-06-06 DIAGNOSIS — Z794 Long term (current) use of insulin: Secondary | ICD-10-CM | POA: Insufficient documentation

## 2022-06-06 DIAGNOSIS — Z7982 Long term (current) use of aspirin: Secondary | ICD-10-CM | POA: Insufficient documentation

## 2022-06-06 DIAGNOSIS — Z79899 Other long term (current) drug therapy: Secondary | ICD-10-CM | POA: Diagnosis not present

## 2022-06-06 DIAGNOSIS — T148XXA Other injury of unspecified body region, initial encounter: Secondary | ICD-10-CM

## 2022-06-06 NOTE — ED Triage Notes (Addendum)
Pt to ED by EMS from home with c/o wound to the sole of his right foot consistent with a blister that has burst. Pt is a diabetic, with a previous amputation to the left (BKA) CBG is 444, pt is hypertensive on arrival.

## 2022-06-07 MED ORDER — DOXYCYCLINE HYCLATE 100 MG PO TABS
100.0000 mg | ORAL_TABLET | Freq: Once | ORAL | Status: AC
  Administered 2022-06-07: 100 mg via ORAL
  Filled 2022-06-07: qty 1

## 2022-06-07 MED ORDER — LIDOCAINE HCL URETHRAL/MUCOSAL 2 % EX GEL
1.0000 | Freq: Once | CUTANEOUS | Status: AC
  Administered 2022-06-07: 1 via TOPICAL
  Filled 2022-06-07: qty 11

## 2022-06-07 MED ORDER — METFORMIN HCL 500 MG PO TABS
500.0000 mg | ORAL_TABLET | Freq: Two times a day (BID) | ORAL | 0 refills | Status: DC
Start: 2022-06-07 — End: 2023-05-15

## 2022-06-07 MED ORDER — METFORMIN HCL 500 MG PO TABS: 500.0000 mg | ORAL_TABLET | Freq: Two times a day (BID) | ORAL | 0 refills | Status: DC

## 2022-06-07 MED ORDER — NAPROXEN 375 MG PO TABS
375.0000 mg | ORAL_TABLET | Freq: Once | ORAL | Status: AC
  Administered 2022-06-07: 375 mg via ORAL
  Filled 2022-06-07: qty 1

## 2022-06-07 MED ORDER — DOXYCYCLINE HYCLATE 100 MG PO CAPS
100.0000 mg | ORAL_CAPSULE | Freq: Two times a day (BID) | ORAL | 0 refills | Status: DC
Start: 2022-06-07 — End: 2022-06-13

## 2022-06-07 NOTE — ED Provider Notes (Signed)
[facility]-EMERGENCY DEPT Provider Note   CSN: [id number] Arrival date & time: 06/06/22  2308     History  Chief Complaint  Patient presents with   foot sore    Brett White is a 61 y.o. male.  The history is provided by the patient.  Illness Location:  Sole of the right foot Quality:  Blister that just popped Severity:  Mild Onset quality:  Sudden Duration:  1 day Timing:  Constant Progression:  Unchanged Chronicity:  New Context:  Is a diabetic Relieved by:  Nothing Worsened by:  Not wearing socks and closed shoes Ineffective treatments:  None Associated symptoms: no fever, no rash and no wheezing   Risk factors:  Diabetes      Home Medications Prior to Admission medications   Medication Sig Start Date End Date Taking? Authorizing Provider  doxycycline (VIBRAMYCIN) 100 MG capsule Take 1 capsule (100 mg total) by mouth 2 (two) times daily. One po bid x 7 days 06/07/22  Yes Almir Botts, MD  amLODipine (NORVASC) 10 MG tablet Take 10 mg by mouth every morning. 05/11/22   [provider]  aspirin 81 MG chewable tablet Chew 1 tablet (81 mg total) by mouth daily. Patient not taking: Reported on 12/10/2021 10/20/21   Maretta Bees, MD  atorvastatin (LIPITOR) 20 MG tablet Take 20 mg by mouth at bedtime. 11/07/21   [provider]  cholecalciferol (VITAMIN D3) 25 MCG (1000 UNIT) tablet Take 1,000 Units by mouth at bedtime. Patient not taking: Reported on 05/22/2022 04/05/22   [provider]  cyclobenzaprine (FLEXERIL) 5 MG tablet Take 5 mg by mouth 3 (three) times daily as needed for muscle spasms. 02/07/22   [provider]  DULoxetine (CYMBALTA) 30 MG capsule Take 1 capsule (30 mg total) by mouth daily. 10/20/21   Ghimire, Werner Lean, MD  FARXIGA 10 MG TABS tablet Take 10 mg by mouth every morning. 02/07/22   [provider]  fluticasone (FLONASE) 50 MCG/ACT nasal spray Place 1 spray into both nostrils  daily. Patient not taking: Reported on 05/22/2022 12/15/21   Lanae Boast, MD  fluticasone-salmeterol (ADVAIR HFA) [phone] MCG/ACT inhaler Inhale 2 puffs into the lungs 2 (two) times daily. Patient not taking: Reported on 05/22/2022 12/15/21   Janeann Forehand D, NP  furosemide (LASIX) 20 MG tablet Take 20 mg by mouth every morning. 05/11/22   [provider]  gabapentin (NEURONTIN) 300 MG capsule Take 300 mg by mouth 2 (two) times daily. Patient not taking: Reported on 05/22/2022 12/04/21   [provider]  glipiZIDE (GLUCOTROL) 10 MG tablet Take 10 mg by mouth every morning. 05/11/22   [provider]  Insulin Pen Needle 32G X 4 MM MISC Use to inject insulin up to 4 times daily as needed. 10/20/21   Ghimire, Werner Lean, MD  losartan (COZAAR) 50 MG tablet Take 1 tablet (50 mg total) by mouth daily. 02/24/22   Marinda Elk, MD  metFORMIN (GLUCOPHAGE) 1000 MG tablet Take 1,000 mg by mouth 2 (two) times daily with a meal.    [provider]  metFORMIN (GLUCOPHAGE) 500 MG tablet Take 1 tablet (500 mg total) by mouth 2 (two) times daily with a meal. 06/07/22   Demian Maisel, MD  Oxycodone HCl 10 MG TABS Take 10 mg by mouth every 6 (six) hours as needed (For pain). Patient not taking: Reported on 05/22/2022 04/30/22   [provider]  OZEMPIC, 2 MG/DOSE, 8 MG/3ML SOPN Inject 2 mg  into the skin every Wednesday. Patient not taking: Reported on 05/22/2022 02/07/22   [provider]  pantoprazole (PROTONIX) 40 MG tablet Take 1 tablet (40 mg total) by mouth daily. 10/20/21   Ghimire, Werner Lean, MD  pregabalin (LYRICA) 100 MG capsule Take 100 mg by mouth 3 (three) times daily. 02/03/22   [provider]      Allergies    Patient has no known allergies.    Review of Systems   Review of Systems  Constitutional:  Negative for fever.  HENT:  Negative for facial swelling.   Respiratory:  Negative for wheezing and stridor.   Skin:  Negative for rash.  All  other systems reviewed and are negative.   Physical Exam Updated Vital Signs BP (!) 185/113   Pulse 87   Temp 98.3 F (36.8 C) (Brett)   Resp 18   SpO2 97%  Physical Exam Vitals and nursing note reviewed.  Constitutional:      General: He is not in acute distress.    Appearance: He is well-developed. He is not diaphoretic.  HENT:     Head: Normocephalic and atraumatic.     Nose: Nose normal.  Eyes:     Conjunctiva/sclera: Conjunctivae normal.     Pupils: Pupils are equal, round, and reactive to light.  Cardiovascular:     Rate and Rhythm: Normal rate and regular rhythm.  Pulmonary:     Effort: Pulmonary effort is normal.     Breath sounds: Normal breath sounds. No wheezing or rales.  Abdominal:     General: Bowel sounds are normal.     Palpations: Abdomen is soft.     Tenderness: There is no abdominal tenderness. There is no guarding or rebound.  Musculoskeletal:        General: Normal range of motion.     Cervical back: Normal range of motion and neck supple.  Skin:    General: Skin is warm and dry.     Capillary Refill: Capillary refill takes less than 2 seconds.  Neurological:     General: No focal deficit present.     Mental Status: He is alert and oriented to person, place, and time.     Deep Tendon Reflexes: Reflexes normal.     ED Results / Procedures / Treatments   Labs (all labs ordered are listed, but only abnormal results are displayed) Labs Reviewed - No data to display  EKG None  Radiology No results found.  Procedures Procedures    Medications Ordered in ED Medications  lidocaine (XYLOCAINE) 2 % jelly 1 Application (1 Application Topical Given 06/07/22 0024)  naproxen (NAPROSYN) tablet 375 mg (375 mg Brett Given 06/07/22 0024)  doxycycline (VIBRA-TABS) tablet 100 mg (100 mg Brett Given 06/07/22 0024)    ED Course/ Medical Decision Making/ A&P                           Medical Decision Making Patient with blister that just popped on the right  foot.  Is here for a check as is diabetic.  Tetanus is up to date   Amount and/or Complexity of Data Reviewed Independent Historian: EMS    Details: See above  External Data Reviewed: notes.    Details: Previous notes reviewed   Risk Prescription drug management. Risk Details: Not infected, blister just popped.  Cleansed in the ED and covered.  Wound care and instruction on keeping covered in white cotton socks and closed  toed shoes.  Antibiotics initiated to prevent infection.  Follow up with your PMD and wound care center.  Those instructions were given verbally and in writing.  Patient is concerned about pain but just filled 120 10 mg oxcodone 7 days ago.  Strict return    Final Clinical Impression(s) / ED Diagnoses Final diagnoses:  Blister   Return for intractable cough, coughing up blood, fevers > 100.4 unrelieved by medication, shortness of breath, intractable vomiting, chest pain, shortness of breath, weakness, numbness, changes in speech, facial asymmetry, abdominal pain, passing out, Inability to tolerate liquids or food, cough, altered mental status or any concerns. No signs of systemic illness or infection. The patient is nontoxic-appearing on exam and vital signs are within normal limits.  I have reviewed the triage vital signs and the nursing notes. Pertinent labs & imaging results that were available during my care of the patient were reviewed by me and considered in my medical decision making (see chart for details). After history, exam, and medical workup I feel the patient has been appropriately medically screened and is safe for discharge home. Pertinent diagnoses were discussed with the patient. Patient was given return precautions.  Rx / DC Orders ED Discharge Orders          Ordered    metFORMIN (GLUCOPHAGE) 500 MG tablet  2 times daily with meals,   Status:  Discontinued        06/07/22 0008    doxycycline (VIBRAMYCIN) 100 MG capsule  2 times daily        06/07/22  0008    metFORMIN (GLUCOPHAGE) 500 MG tablet  2 times daily with meals        06/07/22 0057              Aydrian Halpin, MD 06/07/22 7829

## 2022-06-10 ENCOUNTER — Inpatient Hospital Stay (HOSPITAL_COMMUNITY)
Admission: EM | Admit: 2022-06-10 | Discharge: 2022-06-13 | DRG: 300 | Disposition: A | Discharge status: Skilled Nursing Facility | Payer: Medicare Other | Attending: Family Medicine | Admitting: Family Medicine

## 2022-06-10 ENCOUNTER — Emergency Department (HOSPITAL_COMMUNITY): Payer: Medicare Other

## 2022-06-10 ENCOUNTER — Other Ambulatory Visit: Payer: Self-pay

## 2022-06-10 DIAGNOSIS — L03115 Cellulitis of right lower limb: Secondary | ICD-10-CM | POA: Diagnosis present

## 2022-06-10 DIAGNOSIS — Z7982 Long term (current) use of aspirin: Secondary | ICD-10-CM

## 2022-06-10 DIAGNOSIS — E11628 Type 2 diabetes mellitus with other skin complications: Secondary | ICD-10-CM | POA: Diagnosis present

## 2022-06-10 DIAGNOSIS — E782 Mixed hyperlipidemia: Secondary | ICD-10-CM | POA: Diagnosis present

## 2022-06-10 DIAGNOSIS — L97411 Non-pressure chronic ulcer of right heel and midfoot limited to breakdown of skin: Secondary | ICD-10-CM | POA: Diagnosis present

## 2022-06-10 DIAGNOSIS — I10 Essential (primary) hypertension: Secondary | ICD-10-CM | POA: Diagnosis present

## 2022-06-10 DIAGNOSIS — E1169 Type 2 diabetes mellitus with other specified complication: Secondary | ICD-10-CM | POA: Diagnosis present

## 2022-06-10 DIAGNOSIS — I152 Hypertension secondary to endocrine disorders: Secondary | ICD-10-CM | POA: Diagnosis present

## 2022-06-10 DIAGNOSIS — E1165 Type 2 diabetes mellitus with hyperglycemia: Secondary | ICD-10-CM | POA: Diagnosis present

## 2022-06-10 DIAGNOSIS — E872 Acidosis, unspecified: Secondary | ICD-10-CM | POA: Diagnosis present

## 2022-06-10 DIAGNOSIS — E1151 Type 2 diabetes mellitus with diabetic peripheral angiopathy without gangrene: Principal | ICD-10-CM | POA: Diagnosis present

## 2022-06-10 DIAGNOSIS — K219 Gastro-esophageal reflux disease without esophagitis: Secondary | ICD-10-CM | POA: Diagnosis present

## 2022-06-10 DIAGNOSIS — I70234 Atherosclerosis of native arteries of right leg with ulceration of heel and midfoot: Secondary | ICD-10-CM | POA: Diagnosis present

## 2022-06-10 DIAGNOSIS — L03116 Cellulitis of left lower limb: Secondary | ICD-10-CM | POA: Diagnosis not present

## 2022-06-10 DIAGNOSIS — Z8673 Personal history of transient ischemic attack (TIA), and cerebral infarction without residual deficits: Secondary | ICD-10-CM

## 2022-06-10 DIAGNOSIS — E860 Dehydration: Secondary | ICD-10-CM | POA: Diagnosis present

## 2022-06-10 DIAGNOSIS — Z7951 Long term (current) use of inhaled steroids: Secondary | ICD-10-CM

## 2022-06-10 DIAGNOSIS — Z602 Problems related to living alone: Secondary | ICD-10-CM | POA: Diagnosis present

## 2022-06-10 DIAGNOSIS — Z7984 Long term (current) use of oral hypoglycemic drugs: Secondary | ICD-10-CM

## 2022-06-10 DIAGNOSIS — E1142 Type 2 diabetes mellitus with diabetic polyneuropathy: Secondary | ICD-10-CM | POA: Diagnosis present

## 2022-06-10 DIAGNOSIS — Z89612 Acquired absence of left leg above knee: Secondary | ICD-10-CM

## 2022-06-10 DIAGNOSIS — Z79899 Other long term (current) drug therapy: Secondary | ICD-10-CM

## 2022-06-10 LAB — CBG MONITORING, ED: Glucose-Capillary: 418 mg/dL — ABNORMAL HIGH (ref 70–99)

## 2022-06-10 MED ORDER — ONDANSETRON HCL 4 MG/2ML IJ SOLN
4.0000 mg | Freq: Once | INTRAMUSCULAR | Status: AC
  Administered 2022-06-10: 4 mg via INTRAVENOUS
  Filled 2022-06-10: qty 2

## 2022-06-10 MED ORDER — HYDROMORPHONE HCL 1 MG/ML IJ SOLN
1.0000 mg | Freq: Once | INTRAMUSCULAR | Status: AC
  Administered 2022-06-10: 1 mg via INTRAVENOUS
  Filled 2022-06-10: qty 1

## 2022-06-10 MED ORDER — VANCOMYCIN HCL 2000 MG/400ML IV SOLN
2000.0000 mg | Freq: Once | INTRAVENOUS | Status: AC
  Administered 2022-06-10: 2000 mg via INTRAVENOUS
  Filled 2022-06-10: qty 400

## 2022-06-10 MED ORDER — GABAPENTIN 300 MG PO CAPS
300.0000 mg | ORAL_CAPSULE | ORAL | Status: AC
  Administered 2022-06-10: 300 mg via ORAL
  Filled 2022-06-10: qty 1

## 2022-06-10 MED ORDER — LACTATED RINGERS IV BOLUS
1000.0000 mL | Freq: Once | INTRAVENOUS | Status: AC
  Administered 2022-06-10: 1000 mL via INTRAVENOUS

## 2022-06-10 MED ORDER — OXYCODONE-ACETAMINOPHEN 5-325 MG PO TABS: 1.0000 | ORAL_TABLET | Freq: Once | ORAL | Status: DC

## 2022-06-10 NOTE — ED Triage Notes (Signed)
Pt bib [facility] from home c/o wound to sole of right foot for the past 4 days. Swelling, redness, and tenderness to R foot. 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  BP 198/102, HR 96, Spo2 98%

## 2022-06-10 NOTE — ED Provider Notes (Signed)
[facility] EMERGENCY DEPARTMENT Provider Note   CSN: [id number] Arrival date & time: 06/10/22  2209     History {Add pertinent medical, surgical, social history, OB history to HPI:1} Chief Complaint  Patient presents with   Wound Infection    Brett White is a 61 y.o. male.  61 year old male that presents to the ER today secondary to foot pain.  Has a history of a left AKA.  Was in the ER few days ago secondary to what sounds like a blister on his right plantar surface of his foot.  Started on doxycycline has had progressively worsening redness, swelling and pain since that time.  Also has some redness swelling spreading up his medial side of his left leg he states that that pain seems to go to his groin area.  History of diabetes.        Home Medications Prior to Admission medications   Medication Sig Start Date End Date Taking? Authorizing Provider  amLODipine (NORVASC) 10 MG tablet Take 10 mg by mouth every morning. 05/11/22   [provider]  aspirin 81 MG chewable tablet Chew 1 tablet (81 mg total) by mouth daily. Patient not taking: Reported on 12/10/2021 10/20/21   Maretta Bees, MD  atorvastatin (LIPITOR) 20 MG tablet Take 20 mg by mouth at bedtime. 11/07/21   [provider]  cholecalciferol (VITAMIN D3) 25 MCG (1000 UNIT) tablet Take 1,000 Units by mouth at bedtime. Patient not taking: Reported on 05/22/2022 04/05/22   [provider]  cyclobenzaprine (FLEXERIL) 5 MG tablet Take 5 mg by mouth 3 (three) times daily as needed for muscle spasms. 02/07/22   [provider]  doxycycline (VIBRAMYCIN) 100 MG capsule Take 1 capsule (100 mg total) by mouth 2 (two) times daily. One po bid x 7 days 06/07/22   Palumbo, April, MD  DULoxetine (CYMBALTA) 30 MG capsule Take 1 capsule (30 mg total) by mouth daily. 10/20/21   Ghimire, Werner Lean, MD  FARXIGA 10 MG TABS tablet Take 10 mg by mouth every morning. 02/07/22   [provider]  fluticasone (FLONASE) 50 MCG/ACT nasal spray Place 1 spray into both nostrils daily. Patient not taking: Reported on 05/22/2022 12/15/21   Lanae Boast, MD  fluticasone-salmeterol (ADVAIR HFA) [phone] MCG/ACT inhaler Inhale 2 puffs into the lungs 2 (two) times daily. Patient not taking: Reported on 05/22/2022 12/15/21   Janeann Forehand D, NP  furosemide (LASIX) 20 MG tablet Take 20 mg by mouth every morning. 05/11/22   [provider]  gabapentin (NEURONTIN) 300 MG capsule Take 300 mg by mouth 2 (two) times daily. Patient not taking: Reported on 05/22/2022 12/04/21   [provider]  glipiZIDE (GLUCOTROL) 10 MG tablet Take 10 mg by mouth every morning. 05/11/22   [provider]  Insulin Pen Needle 32G X 4 MM MISC Use to inject insulin up to 4 times daily as needed. 10/20/21   Ghimire, Werner Lean, MD  losartan (COZAAR) 50 MG tablet Take 1 tablet (50 mg total) by mouth daily. 02/24/22   Marinda Elk, MD  metFORMIN (GLUCOPHAGE) 1000 MG tablet Take 1,000 mg by mouth 2 (two) times daily with a meal.    [provider]  metFORMIN (GLUCOPHAGE) 500 MG tablet Take 1 tablet (500 mg total) by mouth 2 (two) times daily with a meal. 06/07/22   Palumbo, April, MD  Oxycodone HCl 10 MG TABS Take 10 mg by mouth every 6 (six) hours as needed (For pain).  Patient not taking: Reported on 05/22/2022 04/30/22   [provider]  OZEMPIC, 2 MG/DOSE, 8 MG/3ML SOPN Inject 2 mg into the skin every Wednesday. Patient not taking: Reported on 05/22/2022 02/07/22   [provider]  pantoprazole (PROTONIX) 40 MG tablet Take 1 tablet (40 mg total) by mouth daily. 10/20/21   Ghimire, Werner Lean, MD  pregabalin (LYRICA) 100 MG capsule Take 100 mg by mouth 3 (three) times daily. 02/03/22   [provider]      Allergies    Patient has no known allergies.    Review of Systems   Review of Systems  Physical Exam Updated Vital Signs BP (!) 184/93   Pulse 90   Temp  98.6 F (37 C) (Oral)   Resp 10   Ht 6\' 2"  (1.88 m)   Wt 95.3 kg   SpO2 97%   BMI 26.96 kg/m  Physical Exam Vitals and nursing note reviewed.  Constitutional:      Appearance: He is well-developed.  HENT:     Head: Normocephalic and atraumatic.  Eyes:     Pupils: Pupils are equal, round, and reactive to light.  Cardiovascular:     Rate and Rhythm: Normal rate.  Pulmonary:     Effort: Pulmonary effort is normal. No respiratory distress.  Abdominal:     General: There is no distension.  Musculoskeletal:        General: Swelling and tenderness present. Normal range of motion.     Cervical back: Normal range of motion.  Skin:    General: Skin is warm and dry.     Findings: Erythema (Surrounding blister on his right plantar surface of his foot.  Also has erythema medial right ankle and distal lower extremity) present.  Neurological:     Mental Status: He is alert.     ED Results / Procedures / Treatments   Labs (all labs ordered are listed, but only abnormal results are displayed) Labs Reviewed  CBC WITH DIFFERENTIAL/PLATELET  BASIC METABOLIC PANEL  SEDIMENTATION RATE  C-REACTIVE PROTEIN  LACTIC ACID, PLASMA  LACTIC ACID, PLASMA    EKG None  Radiology DG Foot Complete Right  Result Date: 06/10/2022 CLINICAL DATA:  Heel wound EXAM: RIGHT FOOT COMPLETE - 3+ VIEW COMPARISON:  None Available. FINDINGS: No fracture or dislocation is seen. The joint spaces are preserved. Mild degenerative changes along the dorsal midfoot. Small plantar and posterior calcaneal enthesophytes. No osseous destruction involving the calcaneus in this patient with reported nonhealing heel wound. Mild dorsal soft tissue swelling along the forefoot. IMPRESSION: No radiographic findings of osteomyelitis. Electronically Signed   By: 08/10/2022 M.D.   On: 06/10/2022 22:53    Procedures Procedures  {Document cardiac monitor, telemetry assessment procedure when appropriate:1}  Medications  Ordered in ED Medications  gabapentin (NEURONTIN) capsule 300 mg (has no administration in time range)  lactated ringers bolus 1,000 mL (has no administration in time range)  vancomycin (VANCOREADY) IVPB 2000 mg/400 mL (has no administration in time range)  HYDROmorphone (DILAUDID) injection 1 mg (has no administration in time range)  ondansetron (ZOFRAN) injection 4 mg (has no administration in time range)    ED Course/ Medical Decision Making/ A&P                           Medical Decision Making Amount and/or Complexity of Data Reviewed Labs: ordered. Radiology: ordered.  Risk Prescription drug management.  Here with worsening cellulitis related to  the wound on his right foot not improving with multiple days of doxycycline. He appears to be in pain but not septic or distressed. However he describes pain radiating up his leg which could be early lymphangitis.  ***  {Document critical care time when appropriate:1} {Document review of labs and clinical decision tools ie heart score, Chads2Vasc2 etc:1}  {Document your independent review of radiology images, and any outside records:1} {Document your discussion with family members, caretakers, and with consultants:1} {Document social determinants of health affecting pt's care:1} {Document your decision making why or why not admission, treatments were needed:1} Final Clinical Impression(s) / ED Diagnoses Final diagnoses:  None    Rx / DC Orders ED Discharge Orders     None

## 2022-06-10 NOTE — ED Provider Triage Note (Signed)
Emergency Medicine Provider Triage Evaluation Note  Brett White , a 61 y.o. male  was evaluated in triage.  Pt complains of right foot pain at the bottom of his foot.  He states it is a burning tingling pain.  He was seen 4 days ago in emergency room for a blister on the ball of his foot.  He states it is more painful than asthma in the past.  He has been taking antibiotics that he was prescribed however he is uncertain of the name of the antibiotic.  Review of Systems  Positive: Right foot pain, blister Negative: Fever  Physical Exam  BP (!) 184/93   Pulse 90   Temp 98.6 F (37 C) (Oral)   Resp 10   Ht 6\' 2"  (1.88 m)   Wt 95.3 kg   SpO2 97%   BMI 26.96 kg/m  Gen:   Awake, no distress   Resp:  Normal effort  MSK:   Moves extremities without difficulty  Other:  Doppler confirmed DP and PT pulse in right foot present.  Sensation intact able to wiggle toes.  Medical Decision Making  Medically screening exam initiated at 10:57 PM.  Appropriate orders placed.  Brett White was informed that the remainder of the evaluation will be completed by another provider, this initial triage assessment does not replace that evaluation, and the importance of remaining in the ED until their evaluation is complete.  On my chart review it appears that he is taking doxycycline. X-ray and basic labs      Jake Church, Gailen Shelter 06/10/22 2258

## 2022-06-11 ENCOUNTER — Encounter (HOSPITAL_COMMUNITY): Payer: Self-pay | Admitting: Internal Medicine

## 2022-06-11 ENCOUNTER — Emergency Department (HOSPITAL_COMMUNITY): Payer: Medicare Other

## 2022-06-11 DIAGNOSIS — E860 Dehydration: Secondary | ICD-10-CM | POA: Diagnosis present

## 2022-06-11 DIAGNOSIS — Z8673 Personal history of transient ischemic attack (TIA), and cerebral infarction without residual deficits: Secondary | ICD-10-CM | POA: Diagnosis not present

## 2022-06-11 DIAGNOSIS — Z79899 Other long term (current) drug therapy: Secondary | ICD-10-CM | POA: Diagnosis not present

## 2022-06-11 DIAGNOSIS — I10 Essential (primary) hypertension: Secondary | ICD-10-CM

## 2022-06-11 DIAGNOSIS — E1165 Type 2 diabetes mellitus with hyperglycemia: Secondary | ICD-10-CM | POA: Diagnosis present

## 2022-06-11 DIAGNOSIS — E1169 Type 2 diabetes mellitus with other specified complication: Secondary | ICD-10-CM | POA: Diagnosis present

## 2022-06-11 DIAGNOSIS — L03115 Cellulitis of right lower limb: Secondary | ICD-10-CM | POA: Diagnosis present

## 2022-06-11 DIAGNOSIS — E11628 Type 2 diabetes mellitus with other skin complications: Secondary | ICD-10-CM | POA: Diagnosis present

## 2022-06-11 DIAGNOSIS — Z602 Problems related to living alone: Secondary | ICD-10-CM | POA: Diagnosis present

## 2022-06-11 DIAGNOSIS — E782 Mixed hyperlipidemia: Secondary | ICD-10-CM | POA: Diagnosis present

## 2022-06-11 DIAGNOSIS — Z7984 Long term (current) use of oral hypoglycemic drugs: Secondary | ICD-10-CM | POA: Diagnosis not present

## 2022-06-11 DIAGNOSIS — L03116 Cellulitis of left lower limb: Secondary | ICD-10-CM | POA: Diagnosis present

## 2022-06-11 DIAGNOSIS — Z89612 Acquired absence of left leg above knee: Secondary | ICD-10-CM | POA: Diagnosis not present

## 2022-06-11 DIAGNOSIS — Z7982 Long term (current) use of aspirin: Secondary | ICD-10-CM | POA: Diagnosis not present

## 2022-06-11 DIAGNOSIS — E1142 Type 2 diabetes mellitus with diabetic polyneuropathy: Secondary | ICD-10-CM | POA: Diagnosis present

## 2022-06-11 DIAGNOSIS — E872 Acidosis, unspecified: Secondary | ICD-10-CM | POA: Diagnosis present

## 2022-06-11 DIAGNOSIS — L97411 Non-pressure chronic ulcer of right heel and midfoot limited to breakdown of skin: Secondary | ICD-10-CM | POA: Diagnosis present

## 2022-06-11 DIAGNOSIS — K219 Gastro-esophageal reflux disease without esophagitis: Secondary | ICD-10-CM | POA: Diagnosis present

## 2022-06-11 DIAGNOSIS — Z7951 Long term (current) use of inhaled steroids: Secondary | ICD-10-CM | POA: Diagnosis not present

## 2022-06-11 DIAGNOSIS — I70234 Atherosclerosis of native arteries of right leg with ulceration of heel and midfoot: Secondary | ICD-10-CM | POA: Diagnosis present

## 2022-06-11 DIAGNOSIS — E1151 Type 2 diabetes mellitus with diabetic peripheral angiopathy without gangrene: Secondary | ICD-10-CM | POA: Diagnosis present

## 2022-06-11 LAB — CBC WITH DIFFERENTIAL/PLATELET
Abs Immature Granulocytes: 0.02 10*3/uL (ref 0.00–0.07)
Basophils Absolute: 0 10*3/uL (ref 0.0–0.1)
Basophils Relative: 1 %
Eosinophils Absolute: 0.1 10*3/uL (ref 0.0–0.5)
Eosinophils Relative: 1 %
HCT: 41.9 % (ref 39.0–52.0)
Hemoglobin: 13.9 g/dL (ref 13.0–17.0)
Immature Granulocytes: 0 %
Lymphocytes Relative: 13 %
Lymphs Abs: 1.1 10*3/uL (ref 0.7–4.0)
MCH: 28.3 pg (ref 26.0–34.0)
MCHC: 33.2 g/dL (ref 30.0–36.0)
MCV: 85.2 fL (ref 80.0–100.0)
Monocytes Absolute: 0.5 10*3/uL (ref 0.1–1.0)
Monocytes Relative: 6 %
Neutro Abs: 6.6 10*3/uL (ref 1.7–7.7)
Neutrophils Relative %: 79 %
Platelets: 264 10*3/uL (ref 150–400)
RBC: 4.92 MIL/uL (ref 4.22–5.81)
RDW: 12.4 % (ref 11.5–15.5)
WBC: 8.3 10*3/uL (ref 4.0–10.5)
nRBC: 0 % (ref 0.0–0.2)

## 2022-06-11 LAB — BASIC METABOLIC PANEL
Anion gap: 13 (ref 5–15)
BUN: 13 mg/dL (ref 6–20)
CO2: 25 mmol/L (ref 22–32)
Calcium: 9.5 mg/dL (ref 8.9–10.3)
Chloride: 97 mmol/L — ABNORMAL LOW (ref 98–111)
Creatinine, Ser: 1.19 mg/dL (ref 0.61–1.24)
GFR, Estimated: >60 mL/min (ref >60)
Glucose, Bld: 394 mg/dL — ABNORMAL HIGH (ref 70–99)
Potassium: 4.4 mmol/L (ref 3.5–5.1)
Sodium: 135 mmol/L (ref 135–145)

## 2022-06-11 LAB — GLUCOSE, CAPILLARY
Glucose-Capillary: 208 mg/dL — ABNORMAL HIGH (ref 70–99)
Glucose-Capillary: 228 mg/dL — ABNORMAL HIGH (ref 70–99)
Glucose-Capillary: 247 mg/dL — ABNORMAL HIGH (ref 70–99)
Glucose-Capillary: 234 mg/dL — ABNORMAL HIGH (ref 70–99)

## 2022-06-11 LAB — LACTIC ACID, PLASMA
Lactic Acid, Venous: 1.3 mmol/L (ref 0.5–1.9)
Lactic Acid, Venous: 2.5 mmol/L (ref 0.5–1.9)

## 2022-06-11 LAB — CBG MONITORING, ED: Glucose-Capillary: 253 mg/dL — ABNORMAL HIGH (ref 70–99)

## 2022-06-11 LAB — HEMOGLOBIN A1C
Hgb A1c MFr Bld: 11.1 % — ABNORMAL HIGH (ref 4.8–5.6)
Mean Plasma Glucose: 271.87 mg/dL

## 2022-06-11 LAB — MRSA NEXT GEN BY PCR, NASAL: MRSA by PCR Next Gen: NOT DETECTED

## 2022-06-11 LAB — SEDIMENTATION RATE: Sed Rate: 22 mm/hr — ABNORMAL HIGH (ref 0–16)

## 2022-06-11 LAB — C-REACTIVE PROTEIN: CRP: 1.1 mg/dL — ABNORMAL HIGH (ref <1.0)

## 2022-06-11 MED ORDER — PREGABALIN 100 MG PO CAPS
100.0000 mg | ORAL_CAPSULE | Freq: Three times a day (TID) | ORAL | Status: DC
  Administered 2022-06-11 – 2022-06-13 (×9): 100 mg via ORAL
  Filled 2022-06-11 (×9): qty 1

## 2022-06-11 MED ORDER — LACTATED RINGERS IV SOLN
INTRAVENOUS | Status: AC
Start: 2022-06-11 — End: 2022-06-11

## 2022-06-11 MED ORDER — ATORVASTATIN CALCIUM 10 MG PO TABS
20.0000 mg | ORAL_TABLET | Freq: Every day | ORAL | Status: DC
  Administered 2022-06-11 – 2022-06-13 (×3): 20 mg via ORAL
  Filled 2022-06-11 (×3): qty 2

## 2022-06-11 MED ORDER — CYCLOBENZAPRINE HCL 5 MG PO TABS: 5.0000 mg | ORAL_TABLET | Freq: Three times a day (TID) | ORAL | Status: DC | PRN

## 2022-06-11 MED ORDER — VANCOMYCIN HCL IN DEXTROSE 1-5 GM/200ML-% IV SOLN
1000.0000 mg | Freq: Two times a day (BID) | INTRAVENOUS | Status: DC
  Administered 2022-06-11: 1000 mg via INTRAVENOUS
  Filled 2022-06-11: qty 200

## 2022-06-11 MED ORDER — HYDROMORPHONE HCL 1 MG/ML IJ SOLN
1.0000 mg | INTRAMUSCULAR | Status: DC | PRN
  Administered 2022-06-11: 1 mg via INTRAVENOUS
  Filled 2022-06-11: qty 1

## 2022-06-11 MED ORDER — INSULIN ASPART 100 UNIT/ML IJ SOLN
0.0000 [IU] | Freq: Three times a day (TID) | INTRAMUSCULAR | Status: DC
  Administered 2022-06-11 (×4): 5 [IU] via SUBCUTANEOUS
  Administered 2022-06-12: 3 [IU] via SUBCUTANEOUS
  Administered 2022-06-12: 2 [IU] via SUBCUTANEOUS
  Administered 2022-06-12: 8 [IU] via SUBCUTANEOUS
  Administered 2022-06-12: 3 [IU] via SUBCUTANEOUS
  Administered 2022-06-13 (×2): 2 [IU] via SUBCUTANEOUS
  Administered 2022-06-13: 3 [IU] via SUBCUTANEOUS

## 2022-06-11 MED ORDER — ONDANSETRON HCL 4 MG/2ML IJ SOLN
4.0000 mg | Freq: Four times a day (QID) | INTRAMUSCULAR | Status: DC | PRN
  Administered 2022-06-11: 4 mg via INTRAVENOUS
  Filled 2022-06-11: qty 2

## 2022-06-11 MED ORDER — DULOXETINE HCL 30 MG PO CPEP
30.0000 mg | ORAL_CAPSULE | Freq: Every day | ORAL | Status: DC
  Administered 2022-06-11 – 2022-06-13 (×3): 30 mg via ORAL
  Filled 2022-06-11 (×3): qty 1

## 2022-06-11 MED ORDER — ACETAMINOPHEN 650 MG RE SUPP: 650.0000 mg | Freq: Four times a day (QID) | RECTAL | Status: DC | PRN

## 2022-06-11 MED ORDER — SODIUM CHLORIDE 0.9 % IV SOLN
2.0000 g | Freq: Three times a day (TID) | INTRAVENOUS | Status: DC
  Administered 2022-06-11 – 2022-06-12 (×4): 2 g via INTRAVENOUS
  Filled 2022-06-11 (×4): qty 12.5

## 2022-06-11 MED ORDER — HYDRALAZINE HCL 20 MG/ML IJ SOLN
10.0000 mg | Freq: Four times a day (QID) | INTRAMUSCULAR | Status: DC | PRN
  Administered 2022-06-11: 10 mg via INTRAVENOUS
  Filled 2022-06-11: qty 1

## 2022-06-11 MED ORDER — SODIUM CHLORIDE 0.9% FLUSH
3.0000 mL | Freq: Two times a day (BID) | INTRAVENOUS | Status: DC
  Administered 2022-06-11 – 2022-06-13 (×6): 3 mL via INTRAVENOUS

## 2022-06-11 MED ORDER — AMLODIPINE BESYLATE 10 MG PO TABS
10.0000 mg | ORAL_TABLET | Freq: Every morning | ORAL | Status: DC
  Administered 2022-06-11 – 2022-06-13 (×3): 10 mg via ORAL
  Filled 2022-06-11 (×3): qty 1

## 2022-06-11 MED ORDER — LOSARTAN POTASSIUM 50 MG PO TABS
50.0000 mg | ORAL_TABLET | Freq: Every day | ORAL | Status: DC
  Administered 2022-06-11 – 2022-06-13 (×3): 50 mg via ORAL
  Filled 2022-06-11 (×3): qty 1

## 2022-06-11 MED ORDER — FUROSEMIDE 20 MG PO TABS
20.0000 mg | ORAL_TABLET | Freq: Every morning | ORAL | Status: DC
  Administered 2022-06-11 – 2022-06-13 (×3): 20 mg via ORAL
  Filled 2022-06-11 (×3): qty 1

## 2022-06-11 MED ORDER — ACETAMINOPHEN 325 MG PO TABS: 650.0000 mg | ORAL_TABLET | Freq: Four times a day (QID) | ORAL | Status: DC | PRN

## 2022-06-11 MED ORDER — POLYETHYLENE GLYCOL 3350 17 G PO PACK: 17.0000 g | PACK | Freq: Every day | ORAL | Status: DC | PRN

## 2022-06-11 MED ORDER — INSULIN GLARGINE-YFGN 100 UNIT/ML ~~LOC~~ SOLN
10.0000 [IU] | Freq: Every day | SUBCUTANEOUS | Status: DC
  Administered 2022-06-11: 10 [IU] via SUBCUTANEOUS
  Filled 2022-06-11 (×2): qty 0.1

## 2022-06-11 MED ORDER — ONDANSETRON HCL 4 MG PO TABS: 4.0000 mg | ORAL_TABLET | Freq: Four times a day (QID) | ORAL | Status: DC | PRN

## 2022-06-11 MED ORDER — IOHEXOL 300 MG/ML  SOLN
100.0000 mL | Freq: Once | INTRAMUSCULAR | Status: AC | PRN
Start: 2022-06-11 — End: 2022-06-11
  Administered 2022-06-11: 100 mL via INTRAVENOUS

## 2022-06-11 MED ORDER — HYDROMORPHONE HCL 1 MG/ML IJ SOLN: 0.5000 mg | INTRAMUSCULAR | Status: DC | PRN

## 2022-06-11 MED ORDER — HYDROMORPHONE HCL 1 MG/ML IJ SOLN
1.0000 mg | INTRAMUSCULAR | Status: DC | PRN
  Administered 2022-06-11 – 2022-06-13 (×14): 1 mg via INTRAVENOUS
  Filled 2022-06-11 (×14): qty 1

## 2022-06-11 MED ORDER — OXYCODONE HCL 5 MG PO TABS
10.0000 mg | ORAL_TABLET | Freq: Four times a day (QID) | ORAL | Status: DC | PRN
  Administered 2022-06-11 – 2022-06-13 (×7): 10 mg via ORAL
  Filled 2022-06-11 (×7): qty 2

## 2022-06-11 MED ORDER — PANTOPRAZOLE SODIUM 40 MG PO TBEC
40.0000 mg | DELAYED_RELEASE_TABLET | Freq: Every day | ORAL | Status: DC
  Administered 2022-06-11 – 2022-06-13 (×3): 40 mg via ORAL
  Filled 2022-06-11 (×3): qty 1

## 2022-06-11 MED ORDER — ENOXAPARIN SODIUM 40 MG/0.4ML IJ SOSY
40.0000 mg | PREFILLED_SYRINGE | INTRAMUSCULAR | Status: DC
  Administered 2022-06-11 – 2022-06-13 (×3): 40 mg via SUBCUTANEOUS
  Filled 2022-06-11 (×3): qty 0.4

## 2022-06-11 NOTE — Assessment & Plan Note (Signed)
.   History of poor glycemic control . Patient been placed on Accu-Cheks before every meal and nightly with sliding scale insulin . Holding home regimen of hypoglycemics . Patient may benefit from basal bolus insulin therapy . Hemoglobin A1C ordered . Diabetic Diet

## 2022-06-11 NOTE — Progress Notes (Signed)
PT Cancellation Note  Patient Details Name: Brett White MRN: [id number] DOB: Oct 11, 1960   Cancelled Treatment:    Reason Eval/Treat Not Completed: Other (comment)  Requested clarification of weight-bearing status via securechat. IF physician plans to allow weight-bearing, also asked for an order for a post-op shoe (to accomodate dressing and minimize extension of toes in order to help minimize stretch/strain on wound bed).    Jerolyn Center, PT Acute Rehabilitation Services  Office [phone]   Zena Amos 06/11/2022, 3:25 PM

## 2022-06-11 NOTE — Progress Notes (Signed)
Inpatient Diabetes Program Recommendations  AACE/ADA: New Consensus Statement on Inpatient Glycemic Control (2015)  Target Ranges:  Prepandial:   less than 140 mg/dL      Peak postprandial:   less than 180 mg/dL (1-2 hours)      Critically ill patients:  140 - 180 mg/dL   Lab Results  Component Value Date   GLUCAP 228 (H) 06/11/2022   HGBA1C 11.1 (H) 06/11/2022    Review of Glycemic Control  Latest Reference Range & Units 06/11/22 05:24 06/11/22 07:21 06/11/22 11:31  Glucose-Capillary 70 - 99 mg/dL 967 (H) 893 (H) 810 (H)   Diabetes history: DM 2 Outpatient Diabetes medications:  Farxiga 10 mg q AM, Glucotrol 10 mg q AM, Metformin 500 mg bid Current orders for Inpatient glycemic control:  Novolog 0-15 units tid with meals and HS Semglee 10 units daily Inpatient Diabetes Program Recommendations:    Agree with orders.  A1C=11.1%- Will follow.   Thanks,  Beryl Meager, RN, BC-ADM Inpatient Diabetes Coordinator Pager [phone]  (8a-5p)

## 2022-06-11 NOTE — TOC Initial Note (Signed)
Transition of Care [facility]) - Initial/Assessment Note    Patient Details  Name: Brett White MRN: [id number] Date of Birth: 01-14-1961  [facility] of [facility] CM/SW Contact:    Curlene Labrum, RN Phone Number: 06/11/2022, 2:35 PM  Clinical Narrative:                 CM met with the patient at the bedside and the patient states that he lives at home with a roommate that his not related.  The patient has DME at home including RW, prosthesis and manuel scooter at the home.  The patient has PCP at [facility] that provides transportation for the patient for MD appointment.  The patient has woundcare needs for Right foot at this time and does not have family to assist.  PT/OT plans to evaluate the patient per MD and CM will follow for disposition needs.  Expected Discharge Plan: [place] Barriers to Discharge: Continued Medical Work up   Patient Goals and CMS Choice Patient states their goals for this hospitalization and ongoing recovery are:: to get better CMS Medicare.gov Compare Post Acute Care list provided to:: Patient Choice offered to / list presented to : Patient  Expected Discharge Plan and Services Expected Discharge Plan: [place]   Discharge Planning Services: CM Consult   Living arrangements for the past 2 months: Apartment                                      Prior Living Arrangements/Services Living arrangements for the past 2 months: Apartment Lives with:: Roommate Patient language and need for interpreter reviewed:: Yes        Need for Family Participation in Patient Care: Yes (Comment) Care giver support system in place?: No (comment) Current home services: DME (Patient had Left lower leg prosthesis, RW and manual scooter at home.) Criminal Activity/Legal Involvement Pertinent to Current Situation/Hospitalization: No - Comment as needed  Activities of Daily Living Home Assistive  Devices/Equipment: Walker (specify type) ADL Screening (condition at time of admission) Patient's cognitive ability adequate to safely complete daily activities?: Yes Is the patient deaf or have difficulty hearing?: No Does the patient have difficulty seeing, even when wearing glasses/contacts?: No Does the patient have difficulty concentrating, remembering, or making decisions?: Yes Patient able to express need for assistance with ADLs?: Yes Does the patient have difficulty dressing or bathing?: No Independently performs ADLs?: Yes (appropriate for developmental age) Does the patient have difficulty walking or climbing stairs?: Yes Weakness of Legs: Right Weakness of Arms/Hands: None  Permission Sought/Granted         Permission granted to share info w AGENCY: [place] for transportation home        Emotional Assessment Appearance:: Appears stated age Attitude/Demeanor/Rapport: Gracious Affect (typically observed): Accepting Orientation: : Oriented to Self, Oriented to Place, Oriented to  Time, Oriented to Situation Alcohol / Substance Use: Not Applicable Psych Involvement: No (comment)  Admission diagnosis:  Cellulitis of left foot [L03.116] Cellulitis of right lower extremity [L03.115] Patient Active Problem List   Diagnosis Date Noted   Cellulitis of right foot 06/11/2022   GERD without esophagitis 06/11/2022   Mixed diabetic hyperlipidemia associated with type 2 diabetes mellitus ([place]) 06/11/2022   Cellulitis of left foot 06/11/2022   Adjustment disorder with depressed mood 05/23/2022   CAP (community acquired pneumonia) 12/09/2021   Chronic  hyponatremia 12/09/2021   Uncontrolled type 2 diabetes mellitus with hyperglycemia, without long-term current use of insulin ([place]) 12/09/2021   Tachycardia 12/09/2021   AKI (acute kidney injury) ([place]) 10/04/2021   Cellulitis 10/03/2021   Acquired absence of left leg below knee ([place]) 04/05/2021   Left foot  infection 03/17/2021   Pain in left ankle and joints of left foot    Back abscess 03/03/2021   Osteomyelitis of great toe of left foot ([place]) 02/03/2021   Uncontrolled type 2 diabetes mellitus with hyperglycemia ([place]) 02/03/2021   Essential hypertension 02/03/2021   Lactic acidosis 02/03/2021   Diabetic polyneuropathy associated with type 2 diabetes mellitus ([place]) 02/03/2021   Osteomyelitis ([place]) 02/03/2021   PCP:  Arthur Holms, NP Pharmacy:   [facility] [address] [place] [phone] Phone: [phone] Fax: [phone]     Social Determinants of Health (SDOH) Interventions    Readmission Risk Interventions     No data to display

## 2022-06-11 NOTE — Assessment & Plan Note (Addendum)
   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 Lajoyce Corners to explore surgical debridement options

## 2022-06-11 NOTE — Progress Notes (Signed)
Pharmacy Antibiotic Note  Brett White is a 61 y.o. male admitted on 06/10/2022 with R foot wound. Pharmacy has been consulted for vancomycin and cefepime dosing. Cr is stable ~ 1.2 mg/dl. Vancomycin load given in ER.  Plan: Vancomycin 1000mg  IV q12h - est AUC 424 Cefepime 2g IV q8h Follow Cr, LOT, Cx Vancomycin levels as needed   Height: 6\' 2"  (188 cm) Weight: 95.3 kg (210 lb) IBW/kg (Calculated) : 82.2  Temp (24hrs), Avg:98.4 F (36.9 C), Min:97.4 F (36.3 C), Max:99.1 F (37.3 C)  Recent Labs  Lab 06/10/22 2350 06/11/22 0255  WBC 8.3  --   CREATININE 1.19  --   LATICACIDVEN 2.5* 1.3    Estimated Creatinine Clearance: 77.7 mL/min (by C-G formula based on SCr of 1.19 mg/dL).    No Known Allergies  08/10/22, PharmD, BCPS, [facility] Clinical Pharmacist [phone] Please check AMION for all [facility] Pharmacy numbers 06/11/2022

## 2022-06-11 NOTE — Evaluation (Signed)
Physical Therapy Evaluation Patient Details Name: Brett White MRN: [id number] DOB: 15-Jul-1961 Today's Date: 06/11/2022  History of Present Illness  60 year old male presents to [facility] emergency department via EMS 06/10/22 with complaints of right foot pain. CT Rt foot negative for osteomyelitis or abscess. PMH includes DMII, HTN, L BKA.  Clinical Impression   Pt admitted secondary to problem above with deficits below. PTA patient was modified independent with use of Left BKA prosthesis. Pt currently requires up to mod assist for transfers due to NWB RLE status. He is unable to safely maintain NWB RLE and ambulate, therefore will need to look at using a wheelchair for locomotion. Patient will benefit from SNF for additional therapies to strengthen LLE to help him maintain NWB RLE.  Anticipate patient will benefit from PT to address problems listed below.Will continue to follow acutely to maximize functional mobility independence and safety.          Recommendations for follow up therapy are one component of a multi-disciplinary discharge planning process, led by the attending physician.  Recommendations may be updated based on patient status, additional functional criteria and insurance authorization.  Follow Up Recommendations Skilled nursing-short term rehab (<3 hours/day) Can patient physically be transported by private vehicle: Yes    Assistance Recommended at Discharge Intermittent Supervision/Assistance  Patient can return home with the following  A lot of help with walking and/or transfers;A lot of help with bathing/dressing/bathroom;Assistance with cooking/housework;Direct supervision/assist for medications management;Assist for transportation;Help with stairs or ramp for entrance    Equipment Recommendations Wheelchair (measurements PT);Wheelchair cushion (measurements PT);Other (comment) (drop-arm BSC)  Recommendations for Other Services  OT consult    Functional  Status Assessment Patient has had a recent decline in their functional status and demonstrates the ability to make significant improvements in function in a reasonable and predictable amount of time.     Precautions / Restrictions Precautions Precautions: Fall Restrictions Weight Bearing Restrictions: Yes RLE Weight Bearing: Non weight bearing Other Position/Activity Restrictions: discussed with Dr. Ashley Royalty and pt wants to be NWB for wound healing      Mobility  Bed Mobility Overal bed mobility: Modified Independent             General bed mobility comments: supine to sit and sit to supine    Transfers Overall transfer level: Needs assistance Equipment used: Rolling walker (2 wheels), None Transfers: Sit to/from Stand, Bed to chair/wheelchair/BSC Sit to Stand: From elevated surface, Min assist     Squat pivot transfers: Min assist     General transfer comment: pt unfortunately putting weight through his right foot for all transfers despite education and cues not to do so; if truly NWB RLE with LLE prosthesis, likely would need mod assist for transfers    Ambulation/Gait     Assistive device: Rolling walker (2 wheels)         General Gait Details: pt unable to advance LLE with RLE NWB with use of left prosthesis and RW  Stairs            Wheelchair Mobility    Modified Rankin (Stroke Patients Only)       Balance Overall balance assessment: Needs assistance Sitting-balance support: No upper extremity supported, Feet unsupported Sitting balance-Leahy Scale: Good     Standing balance support: Bilateral upper extremity supported, Reliant on assistive device for balance Standing balance-Leahy Scale: Poor Standing balance comment: trying to maintain NWB RLE  Pertinent Vitals/Pain Pain Assessment Pain Assessment: 0-10 Pain Score: 10-Worst pain ever Pain Location: rt foot Pain Descriptors / Indicators:  Sharp Pain Intervention(s): Limited activity within patient's tolerance, Monitored during session, Repositioned    Home Living Family/patient expects to be discharged to:: Private residence Living Arrangements: Non-relatives/Friends Available Help at Discharge: Other (Comment) (tenant-rents a room; no assist) Type of Home: Apartment Home Access: Level entry       Home Layout: One level Home Equipment: Scientist, research (medical) (4 wheels);Rolling Walker (2 wheels) (suction cup handrail)      Prior Function Prior Level of Function : Independent/Modified Independent             Mobility Comments: walks with prosthesis and rollator in apartment except for bathroom (rollator does not fit and uses RW) ADLs Comments: kneels in tub/shower; takes bus to grocery shop; has trouble cooking- mostly heats meals in microwave.     Hand Dominance   Dominant Hand: Right    Extremity/Trunk Assessment   Upper Extremity Assessment Upper Extremity Assessment: Overall WFL for tasks assessed    Lower Extremity Assessment Lower Extremity Assessment: RLE deficits/detail;LLE deficits/detail RLE Deficits / Details: large bandage to rt foot due to wound on plantar aspect of forefoot; pt able to straight leg raise LLE Deficits / Details: left BKA; able to don prosthesis independently    Cervical / Trunk Assessment Cervical / Trunk Assessment: Normal  Communication   Communication: No difficulties  Cognition Arousal/Alertness: Awake/alert Behavior During Therapy: WFL for tasks assessed/performed Overall Cognitive Status: History of cognitive impairments - at baseline                                 General Comments: 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; forgets to take his medications        General Comments General comments (skin integrity, edema, etc.): Secure chat with Dr. Ashley Royalty re: weight-bearing status and agreed NWB RLE is best for wound healing/limb  salvage.    Exercises     Assessment/Plan    PT Assessment Patient needs continued PT services  PT Problem List Decreased strength;Decreased activity tolerance;Decreased balance;Decreased mobility;Decreased knowledge of use of DME;Decreased knowledge of precautions;Decreased skin integrity;Pain       PT Treatment Interventions DME instruction;Gait training;Functional mobility training;Therapeutic activities;Therapeutic exercise;Balance training;Patient/family education;Wheelchair mobility training    PT Goals (Current goals can be found in the Care Plan section)  Acute Rehab PT Goals Patient Stated Goal: do what's necessary to save his rt foot PT Goal Formulation: With patient Time For Goal Achievement: 06/25/22 Potential to Achieve Goals: Good    Frequency Min 2X/week     Co-evaluation               AM-PAC PT "6 Clicks" Mobility  Outcome Measure Help needed turning from your back to your side while in a flat bed without using bedrails?: None Help needed moving from lying on your back to sitting on the side of a flat bed without using bedrails?: None Help needed moving to and from a bed to a chair (including a wheelchair)?: A Lot Help needed standing up from a chair using your arms (e.g., wheelchair or bedside chair)?: A Lot Help needed to walk in hospital room?: Total Help needed climbing 3-5 steps with a railing? : Total 6 Click Score: 14    End of Session   Activity Tolerance: Patient tolerated treatment well Patient left: in bed;with call  bell/phone within reach Nurse Communication: Mobility status;Weight bearing status (now NWB; will need to use urinal/BSC) PT Visit Diagnosis: Difficulty in walking, not elsewhere classified (R26.2);Muscle weakness (generalized) (M62.81)    Time: 9470-9628 PT Time Calculation (min) (ACUTE ONLY): 31 min   Charges:   PT Evaluation $PT Eval Moderate Complexity: 1 Mod PT Treatments $Therapeutic Activity: 8-22 mins          Jerolyn Center, PT Acute Rehabilitation Services  Office [phone]   Zena Amos 06/11/2022, 4:22 PM

## 2022-06-11 NOTE — Consult Note (Signed)
WOC Nurse Consult Note: Reason for Consult: right foot wound Patient using LLE prothesis for ambulation, now presents with open wound of the RLE plantar surface related to blister that has unroofed and has significant pain and erythema.   Wound type: neuropathic foot wound; full thickness  Pressure Injury POA: NA Measurement: see nursing flow sheet  Wound bed: 100% pink, clean, moist  Drainage (amount, consistency, odor) not assessed, image did not include dressing Periwound: intact  Dressing procedure/placement/frequency: Cleanse wound with saline, pat dry. Cut to fit silver hydrofiber and place over the wound, top with foam dressing. Change every other day  Ordered evaluation for PT for this patient; successful offloading is key for healing a plantar surface foot wound. However with this patient using a prothesis on the left it may be challenging to offload with his existing prothesis. Will need to evaluate patient's desire to heal this wound which may limit his use of his prothesis on the left.   Would suggest follow up for this wound in a wound care center of the patient's choice. Patient is high risk for limb loss due to previous history of LE amputation.   Discussed POC with bedside nurse and attending MD. Re consult if needed, will not follow at this time. Thanks  Shadman Tozzi [employer], RN,CWOCN, CNS, CWON-AP [phone])

## 2022-06-11 NOTE — Assessment & Plan Note (Signed)
   Continue home regimen of Lyrica 

## 2022-06-11 NOTE — H&P (Signed)
History and Physical    Patient: Brett White MRN: [id number] DOA: 06/10/2022  Date of Service: the patient was seen and examined on 06/11/2022  Patient coming from: Home via EMS  Chief Complaint:  Chief Complaint  Patient presents with   Wound Infection    HPI:   61 year old male with past medical history of gastroesophageal reflux disease, insulin-dependent diabetes mellitus type 2, diabetic peripheral neuropathy, history of prior stroke, hyperlipidemia who presents to [facility] emergency department via EMS with complaints of right foot pain.  Patient explains that for approximate the past 4 to 5 days patient has been experiencing a painful blister on the plantar surface of the right foot which recently ruptured.  After 1 to 2 days of symptoms patient presented to [facility] emergency department and at that time patient was discharged home with a course of doxycycline.  Patient states that despite taking this medication in the days that followed he developed worsening pain of the foot.  Pain is sharp in quality, severe in intensity, radiating proximally worse with any attempt of weightbearing or movement of the affected extremity.  Pain has become associated with increasing redness and swelling of the foot that is tracking up his leg.  Patient denies associated fevers.  Patient does not recall any specific recent injury to his foot.  Due to patient's worsening symptoms he presented to [facility] emergency department for evaluation.  Upon evaluation in the emergency department patient was clinically felt to be suffering from worsening cellulitis likely secondary to diabetic foot infection.  CT imaging of the foot was performed revealing no evidence of osteomyelitis or free air or abscess.  Patient was placed on intravenous vancomycin as well as given intravenous Dilaudid for substantial associated pain in the hospitalist group was then called to assess the  patient for admission to the hospital.    Review of Systems: Review of Systems  Constitutional:  Positive for malaise/fatigue.  Musculoskeletal:        Right foot pain  Neurological:  Positive for weakness.  All other systems reviewed and are negative.    Past Medical History:  Diagnosis Date   GERD (gastroesophageal reflux disease)    HTN (hypertension)    Insulin dependent type 2 diabetes mellitus (HCC)    Neuropathy    Stroke [facility])     Past Surgical History:  Procedure Laterality Date   AMPUTATION Left 02/04/2021   Procedure: LEFT GREAT TOE AMPUTATION;  Surgeon: [name], MD;  Location: [facility] OR;  Service: Orthopedics;  Laterality: Left;   AMPUTATION Left 02/10/2021   Procedure: LEFT FOOT 1ST RAY AMPUTATION;  Surgeon: [name], MD;  Location: [facility] OR;  Service: Orthopedics;  Laterality: Left;   AMPUTATION Left 03/22/2021   Procedure: LEFT BELOW KNEE AMPUTATION;  Surgeon: [name], MD;  Location: [facility] OR;  Service: Orthopedics;  Laterality: Left;   APPENDECTOMY     BACK SURGERY     I & D EXTREMITY Left 03/03/2021   Procedure: LEFT FOOT DEBRIDEMENT;  Surgeon: [name], MD;  Location: [facility] OR;  Service: Orthopedics;  Laterality: Left;   INCISION AND DRAINAGE ABSCESS N/A 10/06/2021   Procedure: INCISION AND DRAINAGE BACK ABSCESS;  Surgeon: Berna Bue, MD;  Location: [facility] OR;  Service: General;  Laterality: N/A;   removal of back cyst     TONSILLECTOMY      Social History:  reports that he has never smoked. He has never used smokeless tobacco.  He reports that he does not currently use alcohol. He reports that he does not currently use drugs.  No Known Allergies  Family History  Problem Relation Age of Onset   Heart attack Neg Hx     Prior to Admission medications   Medication Sig Start Date End Date Taking? Authorizing Provider  amLODipine (NORVASC) 10 MG tablet Take 10 mg by mouth every morning. 05/11/22   [provider]  aspirin 81 MG  chewable tablet Chew 1 tablet (81 mg total) by mouth daily. Patient not taking: Reported on 12/10/2021 10/20/21   Maretta Bees, MD  atorvastatin (LIPITOR) 20 MG tablet Take 20 mg by mouth at bedtime. 11/07/21   [provider]  cholecalciferol (VITAMIN D3) 25 MCG (1000 UNIT) tablet Take 1,000 Units by mouth at bedtime. Patient not taking: Reported on 05/22/2022 04/05/22   [provider]  cyclobenzaprine (FLEXERIL) 5 MG tablet Take 5 mg by mouth 3 (three) times daily as needed for muscle spasms. 02/07/22   [provider]  doxycycline (VIBRAMYCIN) 100 MG capsule Take 1 capsule (100 mg total) by mouth 2 (two) times daily. One po bid x 7 days 06/07/22   Palumbo, April, MD  DULoxetine (CYMBALTA) 30 MG capsule Take 1 capsule (30 mg total) by mouth daily. 10/20/21   Ghimire, Werner Lean, MD  FARXIGA 10 MG TABS tablet Take 10 mg by mouth every morning. 02/07/22   [provider]  fluticasone (FLONASE) 50 MCG/ACT nasal spray Place 1 spray into both nostrils daily. Patient not taking: Reported on 05/22/2022 12/15/21   Lanae Boast, MD  fluticasone-salmeterol (ADVAIR HFA) [phone] MCG/ACT inhaler Inhale 2 puffs into the lungs 2 (two) times daily. Patient not taking: Reported on 05/22/2022 12/15/21   Janeann Forehand D, NP  furosemide (LASIX) 20 MG tablet Take 20 mg by mouth every morning. 05/11/22   [provider]  gabapentin (NEURONTIN) 300 MG capsule Take 300 mg by mouth 2 (two) times daily. Patient not taking: Reported on 05/22/2022 12/04/21   [provider]  glipiZIDE (GLUCOTROL) 10 MG tablet Take 10 mg by mouth every morning. 05/11/22   [provider]  Insulin Pen Needle 32G X 4 MM MISC Use to inject insulin up to 4 times daily as needed. 10/20/21   Ghimire, Werner Lean, MD  losartan (COZAAR) 50 MG tablet Take 1 tablet (50 mg total) by mouth daily. 02/24/22   Marinda Elk, MD  metFORMIN (GLUCOPHAGE) 1000 MG tablet Take 1,000 mg by mouth 2 (two) times  daily with a meal.    [provider]  metFORMIN (GLUCOPHAGE) 500 MG tablet Take 1 tablet (500 mg total) by mouth 2 (two) times daily with a meal. 06/07/22   Palumbo, April, MD  Oxycodone HCl 10 MG TABS Take 10 mg by mouth every 6 (six) hours as needed (For pain). Patient not taking: Reported on 05/22/2022 04/30/22   [provider]  OZEMPIC, 2 MG/DOSE, 8 MG/3ML SOPN Inject 2 mg into the skin every Wednesday. Patient not taking: Reported on 05/22/2022 02/07/22   [provider]  pantoprazole (PROTONIX) 40 MG tablet Take 1 tablet (40 mg total) by mouth daily. 10/20/21   Ghimire, Werner Lean, MD  pregabalin (LYRICA) 100 MG capsule Take 100 mg by mouth 3 (three) times daily. 02/03/22   [provider]    Physical Exam:  Vitals:   06/11/22 0345 06/11/22 0418 06/11/22 0504 06/11/22 0605  BP: (!) 142/99  (!) 142/95 (!) 180/103  Pulse: 86  87 87  Resp: 14  13 18   Temp:  99.1 F (37.3 C) 98.6 F (37 C) 98.3 F (36.8 C)  TempSrc:  Oral Oral   SpO2: 97%  97% 95%  Weight:    100.6 kg  Height:    6\' 2"  (1.88 m)    Constitutional: Awake alert and oriented x3, patient is in distress due to pain Skin: Noted irregularly-shaped shallow ulceration on the plantar surface of the right foot with surrounding redness and induration.  Slightly poor skin turgor otherwise.   Eyes: Pupils are equally reactive to light.  No evidence of scleral icterus or conjunctival pallor.  ENMT: Dry mucous membranes noted.  Posterior pharynx clear of any exudate or lesions.   Neck: normal, supple, no masses, no thyromegaly.  No evidence of jugular venous distension.   Respiratory: clear to auscultation bilaterally, no wheezing, no crackles. Normal respiratory effort. No accessory muscle use.  Cardiovascular: Regular rate and rhythm, no murmurs / rubs / gallops. No extremity edema. 2+ pedal pulses. No carotid bruits.  Chest:   Nontender without crepitus or deformity.   Back:   Nontender without  crepitus or deformity. Abdomen: Abdomen is soft and nontender.  No evidence of intra-abdominal masses.  Positive bowel sounds noted in all quadrants.   Musculoskeletal: Exquisite tenderness of the plantar surface of the right foot with associated skin findings as noted above.  Patient is status post left AKA.  No other deformities otherwise.  Good ROM, no contractures. Normal muscle tone.  Neurologic: CN 2-12 grossly intact. Sensation intact.  Patient moving all 4 extremities spontaneously.  Patient is following all commands.  Patient is responsive to verbal stimuli.   Psychiatric: Patient exhibits normal mood with appropriate affect.  Patient seems to possess insight as to their current situation.    Data Reviewed:  I have personally reviewed and interpreted labs, imaging.  Significant findings are   CT right foot with contrast:  Notable subcutaneous fat stranding around the foot compatible with edema or cellulitis.  No evidence of osteomyelitis or abscess formation.  Lab Results  Component Value Date   WBC 8.3 06/10/2022   HGB 13.9 06/10/2022   HCT 41.9 06/10/2022   MCV 85.2 06/10/2022   PLT 264 06/10/2022   Lab Results  Component Value Date   K 4.4 06/10/2022   Lab Results  Component Value Date   BUN 13 06/10/2022   Lab Results  Component Value Date   CREATININE 1.19 06/10/2022     Assessment and Plan: * Cellulitis of right foot Ruptured blister from several days ago has resulted in progressive cellulitis of the right foot Patient has recently failed outpatient treatment with doxycycline prescribed several days ago at the [facility] emergency department warranting hospitalization for intravenous antibiotics Patient has been placed on vancomycin and cefepime Patient has suffered from multiple MRSA skin infections in the past and is also status post numerous infections of the left lower extremity ultimately resulting in a left AKA  Patient is suffering from substantial  associated pain requiring additional opiate-based analgesics for pain control Obtaining blood cultures Obtaining MRSA PCR screen Wound care consultation placed CT imaging reveals no evidence of osteomyelitis or abscess If patient clinically deteriorates with antibiotics and dressing changes alone will consult Dr. Sharol Given to explore surgical debridement options  Lactic acidosis Lactic acidosis likely secondary to sepsis with concurrent volume depletion Hydrate patient intravenous isotonic fluids while concurrently treating underlying infection Monitoring serial lactic acid levels to ensure downtrending and resolution.  Essential hypertension Resume patients home regimen of oral antihypertensives Titrate antihypertensive regimen as necessary to achieve adequate BP control PRN intravenous antihypertensives for excessively elevated blood pressure    Uncontrolled type 2 diabetes mellitus with hyperglycemia, without long-term current use of insulin (HCC) History of poor glycemic control Patient been placed on Accu-Cheks before every meal and nightly with sliding scale insulin Holding home regimen of hypoglycemics Patient may benefit from basal bolus insulin therapy Hemoglobin A1C ordered Diabetic Diet   GERD without esophagitis Continuing home regimen of daily PPI therapy.   Mixed diabetic hyperlipidemia associated with type 2 diabetes mellitus ([place]) Continuing home regimen of lipid lowering therapy.   Diabetic polyneuropathy associated with type 2 diabetes mellitus ([place]) Continue home regimen of Lyrica       Code Status:  Full code  code status decision has been confirmed with: patient Family Communication: deferred   Consults: Wound care consult placed  Severity of Illness:  The appropriate patient status for this patient is OBSERVATION. Observation status is judged to be reasonable and necessary in order to provide the required intensity of service to ensure the  patient's safety. The patient's presenting symptoms, physical exam findings, and initial radiographic and laboratory data in the context of their medical condition is felt to place them at decreased risk for further clinical deterioration. Furthermore, it is anticipated that the patient will be medically stable for discharge from the hospital within 2 midnights of admission.   Author:  Vernelle Emerald MD  06/11/2022 7:07 AM

## 2022-06-11 NOTE — Assessment & Plan Note (Signed)
Lactic acidosis likely secondary to sepsis with concurrent volume depletion Repeat lactic acid today reveals persisting mild lactic acidosis Hydrate patient intravenous isotonic fluids while concurrently treating underlying infection Monitoring serial lactic acid levels to ensure downtrending and resolution.  

## 2022-06-11 NOTE — Assessment & Plan Note (Signed)
.   Continuing home regimen of lipid lowering therapy.  

## 2022-06-11 NOTE — Plan of Care (Signed)

## 2022-06-11 NOTE — Assessment & Plan Note (Signed)
.   Resume patients home regimen of oral antihypertensives . Titrate antihypertensive regimen as necessary to achieve adequate BP control . PRN intravenous antihypertensives for excessively elevated blood pressure   

## 2022-06-11 NOTE — Plan of Care (Addendum)
61 year old male with a history of type 2 diabetes complicated by peripheral neuropathy, stroke and hyperlipidemia admitted with right foot swelling pain and redness.  He is found to have right lower extremity cellulitis.  CT showed no abscess or osteomyelitis.  He has a wound on the right plantar aspect at the ball of the great toe.  He is on vancomycin and cefepime.  He is status post left above-knee amputation.  Patient failed outpatient treatment with doxycycline.  He came to the ER few days prior to this admission he was discharged home with doxycycline. Lactic acid trending down to 1.3 from 2.5.  CRP 1.1.  Sed rate 22.  White count 8.3. Last A1c was 11.3 on March 2023. On Farxiga glipizide and metformin at home.  We will place him on SSI and Lantus while in hospital. Pictures noted in the media section.  Appreciate wound care input.  Continue current management.

## 2022-06-11 NOTE — Plan of Care (Signed)
  Problem: Education: Goal: Knowledge of General Education information will improve Description Including pain rating scale, medication(s)/side effects and non-pharmacologic comfort measures Outcome: Progressing   Problem: Clinical Measurements: Goal: Will remain free from infection Outcome: Progressing   Problem: Elimination: Goal: Will not experience complications related to bowel motility Outcome: Progressing   Problem: Pain Managment: Goal: General experience of comfort will improve Outcome: Progressing   Problem: Safety: Goal: Ability to remain free from injury will improve Outcome: Progressing   

## 2022-06-11 NOTE — Assessment & Plan Note (Signed)
Continuing home regimen of daily PPI therapy.  

## 2022-06-12 DIAGNOSIS — L03115 Cellulitis of right lower limb: Secondary | ICD-10-CM | POA: Diagnosis not present

## 2022-06-12 LAB — CBC WITH DIFFERENTIAL/PLATELET
Abs Immature Granulocytes: 0.01 10*3/uL (ref 0.00–0.07)
Basophils Absolute: 0 10*3/uL (ref 0.0–0.1)
Basophils Relative: 1 %
Eosinophils Absolute: 0.2 10*3/uL (ref 0.0–0.5)
Eosinophils Relative: 3 %
HCT: 37.6 % — ABNORMAL LOW (ref 39.0–52.0)
Hemoglobin: 12.9 g/dL — ABNORMAL LOW (ref 13.0–17.0)
Immature Granulocytes: 0 %
Lymphocytes Relative: 31 %
Lymphs Abs: 1.7 10*3/uL (ref 0.7–4.0)
MCH: 28.9 pg (ref 26.0–34.0)
MCHC: 34.3 g/dL (ref 30.0–36.0)
MCV: 84.1 fL (ref 80.0–100.0)
Monocytes Absolute: 0.5 10*3/uL (ref 0.1–1.0)
Monocytes Relative: 9 %
Neutro Abs: 3.1 10*3/uL (ref 1.7–7.7)
Neutrophils Relative %: 56 %
Platelets: 248 10*3/uL (ref 150–400)
RBC: 4.47 MIL/uL (ref 4.22–5.81)
RDW: 12.4 % (ref 11.5–15.5)
WBC: 5.4 10*3/uL (ref 4.0–10.5)
nRBC: 0 % (ref 0.0–0.2)

## 2022-06-12 LAB — GLUCOSE, CAPILLARY
Glucose-Capillary: 176 mg/dL — ABNORMAL HIGH (ref 70–99)
Glucose-Capillary: 252 mg/dL — ABNORMAL HIGH (ref 70–99)
Glucose-Capillary: 172 mg/dL — ABNORMAL HIGH (ref 70–99)

## 2022-06-12 LAB — COMPREHENSIVE METABOLIC PANEL
ALT: 11 U/L (ref 0–44)
AST: 14 U/L — ABNORMAL LOW (ref 15–41)
Albumin: 3 g/dL — ABNORMAL LOW (ref 3.5–5.0)
Alkaline Phosphatase: 73 U/L (ref 38–126)
Anion gap: 12 (ref 5–15)
BUN: 17 mg/dL (ref 6–20)
CO2: 26 mmol/L (ref 22–32)
Calcium: 9 mg/dL (ref 8.9–10.3)
Chloride: 98 mmol/L (ref 98–111)
Creatinine, Ser: 0.95 mg/dL (ref 0.61–1.24)
GFR, Estimated: >60 mL/min (ref >60)
Glucose, Bld: 165 mg/dL — ABNORMAL HIGH (ref 70–99)
Potassium: 4.3 mmol/L (ref 3.5–5.1)
Sodium: 136 mmol/L (ref 135–145)
Total Bilirubin: 0.6 mg/dL (ref 0.3–1.2)
Total Protein: 6.1 g/dL — ABNORMAL LOW (ref 6.5–8.1)

## 2022-06-12 LAB — HEMOGLOBIN A1C
Hgb A1c MFr Bld: 10.8 % — ABNORMAL HIGH (ref 4.8–5.6)
Mean Plasma Glucose: 263.26 mg/dL

## 2022-06-12 LAB — MAGNESIUM: Magnesium: 1.8 mg/dL (ref 1.7–2.4)

## 2022-06-12 MED ORDER — GLIPIZIDE 5 MG PO TABS
10.0000 mg | ORAL_TABLET | Freq: Every morning | ORAL | Status: DC
  Administered 2022-06-12 – 2022-06-13 (×2): 10 mg via ORAL
  Filled 2022-06-12 (×2): qty 2

## 2022-06-12 MED ORDER — DAPAGLIFLOZIN PROPANEDIOL 10 MG PO TABS
10.0000 mg | ORAL_TABLET | Freq: Every morning | ORAL | Status: DC
  Administered 2022-06-12 – 2022-06-13 (×2): 10 mg via ORAL
  Filled 2022-06-12 (×2): qty 1

## 2022-06-12 MED ORDER — INSULIN GLARGINE-YFGN 100 UNIT/ML ~~LOC~~ SOLN
15.0000 [IU] | Freq: Every day | SUBCUTANEOUS | Status: DC
  Administered 2022-06-12 – 2022-06-13 (×2): 15 [IU] via SUBCUTANEOUS
  Filled 2022-06-12 (×3): qty 0.15

## 2022-06-12 NOTE — Progress Notes (Addendum)
PROGRESS NOTE    Brett White  OMV:[id number] DOB: 11/10/1960 DOA: 06/10/2022 PCP: Loura Back, NP  Brief Narrative 61 year old male with past medical history of gastroesophageal reflux disease, insulin-dependent diabetes mellitus type 2, diabetic peripheral neuropathy, history of prior stroke, hyperlipidemia admitted with right foot pain.  It was initially thought he has right foot cellulitis.  With normal sed rate CRP no leukocytosis and a clean looking right plantar wound antibiotics were stopped after discussing with ID.     CT imaging of the foot was performed revealing no evidence of osteomyelitis or free air or abscess.  Assessment & Plan:   Principal Problem:   Cellulitis of right foot Active Problems:   Lactic acidosis   Essential hypertension   Uncontrolled type 2 diabetes mellitus with hyperglycemia, without long-term current use of insulin (HCC)   GERD without esophagitis   Mixed diabetic hyperlipidemia associated with type 2 diabetes mellitus (HCC)   Diabetic polyneuropathy associated with type 2 diabetes mellitus (HCC)   Cellulitis of left foot    #1 wound on the plantar aspect of the right foot in the setting of uncontrolled diabetes and history of left above-knee amputation.  Patient lives alone unable to care for himself or care for the wound.  Initially he was started on vancomycin and cefepime empirically.  Wound does not appear to be infected with green clear beefy red  base noted.  Discussed with ID DC'd antibiotics. He will need SNF.  He has a prosthetic left lower extremity but he will not be able to walk due to the wound on the right foot.    #2 uncontrolled type 2 diabetes -increase semiglee to 15 units.   Hemoglobin A1c is 10.8  restart oral agents. Marcelline Deist prior to admission Restart glipizide and Farxiga on metformin  #3 history of essential hypertension on Norvasc  #4 hyperlipidemia on Lipitor  #5 GERD on Protonix  #6 neuropathy on Cymbalta and  Lyrica    Estimated body mass index is 28.48 kg/m as calculated from the following:   Height as of this encounter: 6\' 2"  (1.88 m).   Weight as of this encounter: 100.6 kg.  DVT prophylaxis: Lovenox  code Status: Full code Family Communication: None at bedside  disposition Plan:  Status is: Inpatient Remains inpatient appropriate because: Await safe discharge to SNF   Consultants:  None  Procedures: None  antimicrobials: none Subjective: He is resting in bed he is anxious to go to a rehab facility to care for his right foot wound he wont be able to care for it at home since he lives alone he complains of severe pain in the right foot he is status post left AKA  Objective: Vitals:   06/11/22 1929 06/12/22 0001 06/12/22 0316 06/12/22 0750  BP: 139/83 (!) 148/94 115/87 (!) 157/100  Pulse: 87 73 70 70  Resp: 17 17 18 17   Temp: 98.1 F (36.7 C) (!) 97.5 F (36.4 C) 98 F (36.7 C) (!) 97.5 F (36.4 C)  TempSrc: Oral  Oral Oral  SpO2: 93% 95% 93% 95%  Weight:      Height:        Intake/Output Summary (Last 24 hours) at 06/12/2022 1337 Last data filed at 06/12/2022 1014 Gross per 24 hour  Intake 120 ml  Output 1550 ml  Net -1430 ml   Filed Weights   06/10/22 2213 06/11/22 0605  Weight: 95.3 kg 100.6 kg    Examination:  General exam: Appears calm and comfortable  Respiratory system:  Clear to auscultation. Respiratory effort normal. Cardiovascular system: S1 & S2 heard, RRR. No JVD, murmurs, rubs, gallops or clicks. No pedal edema. Gastrointestinal system: Abdomen is nondistended, soft and nontender. No organomegaly or masses felt. Normal bowel sounds heard. Central nervous system: Alert and oriented. No focal neurological deficits. Extremities: Left AKA right foot in dressing see pictures in the media section Skin: No rashes, lesions or ulcers Psychiatry: Judgement and insight appear normal. Mood & affect appropriate.     Data Reviewed: I have personally reviewed  following labs and imaging studies  CBC: Recent Labs  Lab 06/10/22 2350 06/12/22 0339  WBC 8.3 5.4  NEUTROABS 6.6 3.1  HGB 13.9 12.9*  HCT 41.9 37.6*  MCV 85.2 84.1  PLT 264 248   Basic Metabolic Panel: Recent Labs  Lab 06/10/22 2350 06/12/22 0339  NA 135 136  K 4.4 4.3  CL 97* 98  CO2 25 26  GLUCOSE 394* 165*  BUN 13 17  CREATININE 1.19 0.95  CALCIUM 9.5 9.0  MG  --  1.8   GFR: Estimated Creatinine Clearance: 106.1 mL/min (by C-G formula based on SCr of 0.95 mg/dL). Liver Function Tests: Recent Labs  Lab 06/12/22 0339  AST 14*  ALT 11  ALKPHOS 73  BILITOT 0.6  PROT 6.1*  ALBUMIN 3.0*   No results for input(s): "LIPASE", "AMYLASE" in the last 168 hours. No results for input(s): "AMMONIA" in the last 168 hours. Coagulation Profile: No results for input(s): "INR", "PROTIME" in the last 168 hours. Cardiac Enzymes: No results for input(s): "CKTOTAL", "CKMB", "CKMBINDEX", "TROPONINI" in the last 168 hours. BNP (last 3 results) No results for input(s): "PROBNP" in the last 8760 hours. HbA1C: Recent Labs    06/11/22 0907 06/12/22 0339  HGBA1C 11.1* 10.8*   CBG: Recent Labs  Lab 06/11/22 1131 06/11/22 1707 06/11/22 2131 06/12/22 0753 06/12/22 1138  GLUCAP 228* 247* 234* 176* 252*   Lipid Profile: No results for input(s): "CHOL", "HDL", "LDLCALC", "TRIG", "CHOLHDL", "LDLDIRECT" in the last 72 hours. Thyroid Function Tests: No results for input(s): "TSH", "T4TOTAL", "FREET4", "T3FREE", "THYROIDAB" in the last 72 hours. Anemia Panel: No results for input(s): "VITAMINB12", "FOLATE", "FERRITIN", "TIBC", "IRON", "RETICCTPCT" in the last 72 hours. Sepsis Labs: Recent Labs  Lab 06/10/22 2350 06/11/22 0255  LATICACIDVEN 2.5* 1.3    Recent Results (from the past 240 hour(s))  MRSA Next Gen by PCR, Nasal     Status: None   Collection Time: 06/11/22  7:00 AM   Specimen: Nasal Mucosa; Nasal Swab  Result Value Ref Range Status   MRSA by PCR Next Gen NOT  DETECTED NOT DETECTED Final    Comment: (NOTE) The GeneXpert MRSA Assay (FDA approved for NASAL specimens only), is one component of a comprehensive MRSA colonization surveillance program. It is not intended to diagnose MRSA infection nor to guide or monitor treatment for MRSA infections. Test performance is not FDA approved in patients less than 32 years old. Performed at [facility], [address], [place], [place] [address]   Culture, blood (Routine X 2) w Reflex to ID Panel     Status: None (Preliminary result)   Collection Time: 06/11/22  9:07 AM   Specimen: BLOOD RIGHT HAND  Result Value Ref Range Status   Specimen Description BLOOD RIGHT HAND  Final   Special Requests   Final    AEROBIC BOTTLE ONLY Blood Culture results may not be optimal due to an inadequate volume of blood received in culture bottles  Culture   Final    NO GROWTH < 24 HOURS Performed at [facility], [address] [place], [place] [address]    Report Status PENDING  Incomplete  Culture, blood (Routine X 2) w Reflex to ID Panel     Status: None (Preliminary result)   Collection Time: 06/11/22  9:09 AM   Specimen: BLOOD RIGHT ARM  Result Value Ref Range Status   Specimen Description BLOOD RIGHT ARM  Final   Special Requests IN PEDIATRIC BOTTLE Blood Culture adequate volume  Final   Culture   Final    NO GROWTH < 24 HOURS Performed at [facility], [address], [place], [place] [address]    Report Status PENDING  Incomplete         Radiology Studies: CT FOOT RIGHT W CONTRAST  Result Date: 06/11/2022 CLINICAL DATA:  Wound to sole of right foot with swelling, redness, and tenderness; eval for abscess EXAM: CT OF THE LOWER RIGHT EXTREMITY WITH CONTRAST TECHNIQUE: Multidetector CT imaging of the lower right extremity was performed according to the standard protocol following intravenous contrast administration. RADIATION DOSE REDUCTION: This exam was performed according to the  departmental dose-optimization program which includes automated exposure control, adjustment of the mA and/or kV according to patient size and/or use of iterative reconstruction technique. CONTRAST:  OMNIPAQUE IOHEXOL 300 MG/ML  SOLN COMPARISON:  Radiographs 06/10/2022 FINDINGS: Bones/Joint/Cartilage No acute fracture or dislocation. No evidence of osteomyelitis. Scattered degenerative changes greatest about the midfoot. Calcaneal spurs. Ligaments Suboptimally assessed by CT. Muscles and Tendons Tendons are grossly intact.  Diffuse muscle atrophy. Soft tissues Stranding within the subcutaneous fat of the foot with more confluence edema about the dorsum of the foot. No peripherally enhancing organized collection to suggest abscess. IMPRESSION: Subcutaneous fat stranding about the foot compatible with edema or cellulitis. No evidence of osteomyelitis. No abscess. Electronically Signed   By: Minerva Fester M.D.   On: 06/11/2022 00:55   DG Foot Complete Right  Result Date: 06/10/2022 CLINICAL DATA:  Heel wound EXAM: RIGHT FOOT COMPLETE - 3+ VIEW COMPARISON:  None Available. FINDINGS: No fracture or dislocation is seen. The joint spaces are preserved. Mild degenerative changes along the dorsal midfoot. Small plantar and posterior calcaneal enthesophytes. No osseous destruction involving the calcaneus in this patient with reported nonhealing heel wound. Mild dorsal soft tissue swelling along the forefoot. IMPRESSION: No radiographic findings of osteomyelitis. Electronically Signed   By: Charline Bills M.D.   On: 06/10/2022 22:53        Scheduled Meds:  amLODipine  10 mg Oral q morning   atorvastatin  20 mg Oral Daily   DULoxetine  30 mg Oral Daily   enoxaparin (LOVENOX) injection  40 mg Subcutaneous Q24H   furosemide  20 mg Oral q morning   insulin aspart  0-15 Units Subcutaneous TID AC & HS   insulin glargine-yfgn  15 Units Subcutaneous QHS   losartan  50 mg Oral Daily   pantoprazole  40 mg  Oral Daily   pregabalin  100 mg Oral TID   sodium chloride flush  3 mL Intravenous Q12H   Continuous Infusions:  ceFEPime (MAXIPIME) IV 2 g (06/12/22 0524)     LOS: 1 day    Time spent: 38 min Alwyn Ren, MD 06/12/2022, 1:37 PM

## 2022-06-12 NOTE — NC FL2 (Signed)
[place] MEDICAID FL2 LEVEL OF CARE SCREENING TOOL     IDENTIFICATION  Patient Name: Brett White Birthdate: March 11, 1961 Sex: male Admission Date (Current Location): 06/10/2022  [facility] and [place] Number:  Producer, television/film/video and Address:  [facility], [address] [place], [place] [address]      Provider Number: [id number]  Attending Physician Name and Address:  Alwyn Ren, MD  Relative Name and Phone Number:       Current Level of Care: Hospital Recommended Level of Care: Skilled Nursing Facility Prior Approval Number:    Date Approved/Denied:   PASRR Number: [id number]  Discharge Plan: SNF    Current Diagnoses: Patient Active Problem List   Diagnosis Date Noted   Cellulitis of right foot 06/11/2022   GERD without esophagitis 06/11/2022   Mixed diabetic hyperlipidemia associated with type 2 diabetes mellitus (HCC) 06/11/2022   Cellulitis of left foot 06/11/2022   Adjustment disorder with depressed mood 05/23/2022   CAP (community acquired pneumonia) 12/09/2021   Chronic hyponatremia 12/09/2021   Uncontrolled type 2 diabetes mellitus with hyperglycemia, without long-term current use of insulin (HCC) 12/09/2021   Tachycardia 12/09/2021   AKI (acute kidney injury) (HCC) 10/04/2021   Cellulitis 10/03/2021   Acquired absence of left leg below knee (HCC) 04/05/2021   Left foot infection 03/17/2021   Pain in left ankle and joints of left foot    Back abscess 03/03/2021   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   Uncontrolled type 2 diabetes mellitus with hyperglycemia (HCC) 02/03/2021   Essential hypertension 02/03/2021   Lactic acidosis 02/03/2021   Diabetic polyneuropathy associated with type 2 diabetes mellitus (HCC) 02/03/2021   Osteomyelitis (HCC) 02/03/2021    Orientation RESPIRATION BLADDER Height & Weight     Self, Time, Situation, Place  Normal Continent Weight: 100.6 kg Height:  6\' 2"  (188 cm)   BEHAVIORAL SYMPTOMS/MOOD NEUROLOGICAL BOWEL NUTRITION STATUS      Continent Diet  AMBULATORY STATUS COMMUNICATION OF NEEDS Skin   Total Care Verbally Other (Comment) (Right foot wound - wound care QOD - Hydrofiber dressing)                       Personal Care Assistance Level of Assistance  Bathing, Feeding, Dressing Bathing Assistance: Limited assistance Feeding assistance: Independent Dressing Assistance: Limited assistance     Functional Limitations Info  Sight, Hearing, Speech Sight Info: Adequate Hearing Info: Adequate Speech Info: Adequate    SPECIAL CARE FACTORS FREQUENCY  PT (By licensed PT), OT (By licensed OT)     PT Frequency: 3-5 x per week OT Frequency: 3-5 x per week            Contractures Contractures Info: Not present    Additional Factors Info  Code Status, Allergies, Psychotropic, Insulin Sliding Scale Code Status Info: Full code Allergies Info: NKDA Psychotropic Info: Lyrica, Cymbalta Insulin Sliding Scale Info: Moderate SS Insulin 3 x per day plus Q HS       Current Medications (06/12/2022):  This is the current hospital active medication list Current Facility-Administered Medications  Medication Dose Route Frequency Provider Last Rate Last Admin   acetaminophen (TYLENOL) tablet 650 mg  650 mg Oral Q6H PRN Shalhoub, 08/12/2022, MD       Or   acetaminophen (TYLENOL) suppository 650 mg  650 mg Rectal Q6H PRN Shalhoub, Deno Lunger, MD       amLODipine (NORVASC) tablet 10 mg  10 mg Oral q morning Shalhoub, Deno Lunger, MD   10 mg at 06/12/22 0847   atorvastatin (LIPITOR) tablet 20 mg  20 mg Oral Daily Shalhoub, Deno Lunger, MD   20 mg at 06/12/22 0847   cyclobenzaprine (FLEXERIL) tablet 5 mg  5 mg Oral TID PRN Marinda Elk, MD       dapagliflozin propanediol (FARXIGA) tablet 10 mg  10 mg Oral q morning Alwyn Ren, MD       DULoxetine (CYMBALTA) DR capsule 30 mg  30 mg Oral Daily Shalhoub, Deno Lunger, MD   30 mg at 06/12/22 0847    enoxaparin (LOVENOX) injection 40 mg  40 mg Subcutaneous Q24H Marinda Elk, MD   40 mg at 06/12/22 0525   furosemide (LASIX) tablet 20 mg  20 mg Oral q morning Shalhoub, Deno Lunger, MD   20 mg at 06/12/22 0847   glipiZIDE (GLUCOTROL) tablet 10 mg  10 mg Oral q morning Alwyn Ren, MD       hydrALAZINE (APRESOLINE) injection 10 mg  10 mg Intravenous Q6H PRN Marinda Elk, MD   10 mg at 06/11/22 0734   HYDROmorphone (DILAUDID) injection 1 mg  1 mg Intravenous Q3H PRN Marinda Elk, MD   1 mg at 06/12/22 0844   insulin aspart (novoLOG) injection 0-15 Units  0-15 Units Subcutaneous TID AC & HS Shalhoub, Deno Lunger, MD   8 Units at 06/12/22 1354   insulin glargine-yfgn (SEMGLEE) injection 15 Units  15 Units Subcutaneous QHS Alwyn Ren, MD       losartan (COZAAR) tablet 50 mg  50 mg Oral Daily Marinda Elk, MD   50 mg at 06/12/22 0847   ondansetron (ZOFRAN) tablet 4 mg  4 mg Oral Q6H PRN Marinda Elk, MD       Or   ondansetron [facility]) injection 4 mg  4 mg Intravenous Q6H PRN Shalhoub, Deno Lunger, MD   4 mg at 06/11/22 1844   oxyCODONE (Oxy IR/ROXICODONE) immediate release tablet 10 mg  10 mg Oral Q6H PRN Marinda Elk, MD   10 mg at 06/12/22 0546   pantoprazole (PROTONIX) EC tablet 40 mg  40 mg Oral Daily Shalhoub, Deno Lunger, MD   40 mg at 06/12/22 0847   polyethylene glycol (MIRALAX / GLYCOLAX) packet 17 g  17 g Oral Daily PRN Shalhoub, Deno Lunger, MD       pregabalin (LYRICA) capsule 100 mg  100 mg Oral TID Marinda Elk, MD   100 mg at 06/12/22 0847   sodium chloride flush (NS) 0.9 % injection 3 mL  3 mL Intravenous Q12H Shalhoub, Deno Lunger, MD   3 mL at 06/12/22 0847     Discharge Medications: Please see discharge summary for a list of discharge medications.  Relevant Imaging Results:  Relevant Lab Results:   Additional Information SS# [id number]. Moderna COVID-19 Vaccine 07/25/2021  Janae Bridgeman, RN

## 2022-06-12 NOTE — TOC Progression Note (Signed)
Transition of Care [facility]) - Progression Note    Patient Details  Name: Brett White MRN: [id number] Date of Birth: [birth year]  Transition of Care [facility]) CM/SW Contact  Janae Bridgeman, RN Phone Number: 06/12/2022, 3:33 PM  Clinical Narrative:    CM spoke with the patient at the bedside to discuss transitions of care to rehabilitation facility - SNF and the patient was in agreement.  I faxed the patient out in the hub and presented the patient with Medicare bed offers and the patient chose [facility].  I was unable to locate the patient using the [facility] portal and sent a message to [place], CM at [facility] requesting that the facility assist with starting insurance authorization.   Expected Discharge Plan: Home w Home Health Services Barriers to Discharge: Continued Medical Work up  Expected Discharge Plan and Services Expected Discharge Plan: Home w Home Health Services   Discharge Planning Services: CM Consult   Living arrangements for the past 2 months: Apartment                                       Social Determinants of Health (SDOH) Interventions    Readmission Risk Interventions     No data to display

## 2022-06-12 NOTE — Progress Notes (Addendum)
Inpatient Diabetes Program Recommendations  AACE/ADA: New Consensus Statement on Inpatient Glycemic Control (2015)  Target Ranges:  Prepandial:   less than 140 mg/dL      Peak postprandial:   less than 180 mg/dL (1-2 hours)      Critically ill patients:  140 - 180 mg/dL   Lab Results  Component Value Date   GLUCAP 234 (H) 06/11/2022   HGBA1C 10.8 (H) 06/12/2022    Review of Glycemic Control  Latest Reference Range & Units 06/11/22 07:21 06/11/22 11:31 06/11/22 17:07 06/11/22 21:31  Glucose-Capillary 70 - 99 mg/dL 660 (H) 630 (H) 160 (H) 234 (H)   Diabetes history: DM 2 Outpatient Diabetes medications:  Farxiga 10 mg q AM, Glucotrol 10 mg q AM, Metformin 500 mg bid Current orders for Inpatient glycemic control:  Novolog 0-15 units tid with meals and HS Semglee 10 units daily Inpatient Diabetes Program Recommendations:    -   Increase Semglee to 15 units  Agree with orders.  A1C=11.1%- Will see pt this admission.   Spoke with pt at bedside. Pt reports having a Trulicity injectable but only had 2-3 doses with the help of a home health nurse. Pt reports needing help with injections. Pt also reports that he is looking into a facility due to his memory issues to help with his medications. Pt reports not consistently taking his current medications due to his memory issues. If he does go back home I told him he would need help everyday.   Thanks,  Christena Deem RN, MSN, BC-ADM Inpatient Diabetes Coordinator Team Pager [phone] (8a-5p)

## 2022-06-12 NOTE — Evaluation (Signed)
Occupational Therapy Evaluation Patient Details Name: Brett White MRN: [id number] DOB: March 25, 1961 Today's Date: 06/12/2022   History of Present Illness 61 year old male presents to [facility] emergency department via EMS 06/10/22 with complaints of right foot pain. CT Rt foot negative for osteomyelitis or abscess. PMH includes DMII, HTN, L BKA.   Clinical Impression   Pt at this time was able to complete UE ADLS while sitting and LD with min guard to set up with lateral leaning side to side. Pt however did note to attempt to complete transfers without walker and needed cues for pacing self and safety to be able to follow WB status at this time. Pt currently with functional limitations due to the deficits listed below (see OT Problem List).  Pt will benefit from skilled OT to increase their safety and independence with ADL and functional mobility for ADL to facilitate discharge to venue listed below.        Recommendations for follow up therapy are one component of a multi-disciplinary discharge planning process, led by the attending physician.  Recommendations may be updated based on patient status, additional functional criteria and insurance authorization.   Follow Up Recommendations  Skilled nursing-short term rehab (<3 hours/day)    Assistance Recommended at Discharge Intermittent Supervision/Assistance  Patient can return home with the following A little help with walking and/or transfers;A little help with bathing/dressing/bathroom;Assistance with cooking/housework;Assist for transportation    Functional Status Assessment  Patient has had a recent decline in their functional status and demonstrates the ability to make significant improvements in function in a reasonable and predictable amount of time.  Equipment Recommendations  Other (comment) (TBD at next level of care)    Recommendations for Other Services       Precautions / Restrictions Precautions Precautions:  Fall Precaution Comments: Pt noted to be a little impulsive due to frustrated about loosing wallet Restrictions Weight Bearing Restrictions: Yes RLE Weight Bearing: Non weight bearing Other Position/Activity Restrictions: PER note NWB to RLE due to wound healing      Mobility Bed Mobility Overal bed mobility: Independent                  Transfers Overall transfer level: Needs assistance Equipment used: Rolling walker (2 wheels), None Transfers: Sit to/from Stand Sit to Stand: Min guard           General transfer comment: Pt needed cues about sequencing as attempted to transfer with no RW as impatient to complete      Balance Overall balance assessment: Needs assistance Sitting-balance support: No upper extremity supported, Feet unsupported Sitting balance-Leahy Scale: Good     Standing balance support: Bilateral upper extremity supported, Reliant on assistive device for balance Standing balance-Leahy Scale: Poor Standing balance comment: trying to maintain NWB RLE                           ADL either performed or assessed with clinical judgement   ADL Overall ADL's : Needs assistance/impaired Eating/Feeding: Independent;Sitting   Grooming: Wash/dry hands;Wash/dry face;Set up;Sitting   Upper Body Bathing: Set up;Sitting   Lower Body Bathing: Min guard;Cueing for safety;Cueing for sequencing;Sitting/lateral leans   Upper Body Dressing : Set up;Sitting   Lower Body Dressing: Min guard;Cueing for safety;Cueing for sequencing;Sitting/lateral leans   Toilet Transfer: Min guard;Cueing for safety;Cueing for sequencing;Stand-pivot;Rolling walker (2 wheels)   Toileting- Clothing Manipulation and Hygiene: Moderate assistance;Cueing for safety;Cueing for sequencing;Sit to/from stand  General ADL Comments: limited ambulation due to following through with WB in standing, pt was able to voice in session but due to frustration at the time of  missing wallet was more focused on at this time     Vision Baseline Vision/History: 0 No visual deficits Ability to See in Adequate Light: 0 Adequate Vision Assessment?: No apparent visual deficits     Perception     Praxis      Pertinent Vitals/Pain Pain Assessment Pain Assessment: 0-10 Pain Score: 10-Worst pain ever Pain Location: rt foot Pain Descriptors / Indicators: Aching, Constant, Discomfort, Grimacing, Guarding Pain Intervention(s): Limited activity within patient's tolerance, Monitored during session, Premedicated before session, Repositioned     Hand Dominance Right   Extremity/Trunk Assessment Upper Extremity Assessment Upper Extremity Assessment: Overall WFL for tasks assessed   Lower Extremity Assessment Lower Extremity Assessment: Defer to PT evaluation   Cervical / Trunk Assessment Cervical / Trunk Assessment: Normal   Communication Communication Communication: No difficulties   Cognition Arousal/Alertness: Awake/alert Behavior During Therapy: WFL for tasks assessed/performed Overall Cognitive Status: History of cognitive impairments - at baseline                                 General Comments: 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; forgets to take his medications     General Comments       Exercises     Shoulder Instructions      Home Living Family/patient expects to be discharged to:: Private residence Living Arrangements: Non-relatives/Friends Available Help at Discharge: Other (Comment) Type of Home: Apartment Home Access: Level entry     Home Layout: One level     Bathroom Shower/Tub: Chief Strategy Officer: Standard Bathroom Accessibility: No   Home Equipment: Rollator (4 wheels);Rolling Walker (2 wheels)          Prior Functioning/Environment Prior Level of Function : Independent/Modified Independent             Mobility Comments: walks with prosthesis and rollator in apartment except  for bathroom (rollator does not fit and uses RW) ADLs Comments: kneels in tub/shower; takes bus to grocery shop; has trouble cooking- mostly heats meals in microwave.        OT Problem List: Decreased strength;Decreased range of motion;Decreased activity tolerance;Impaired balance (sitting and/or standing);Decreased safety awareness;Decreased knowledge of use of DME or AE;Cardiopulmonary status limiting activity;Pain      OT Treatment/Interventions: Self-care/ADL training;Therapeutic exercise;DME and/or AE instruction;Therapeutic activities;Patient/family education;Balance training    OT Goals(Current goals can be found in the care plan section) Acute Rehab OT Goals Patient Stated Goal: to go to rehab OT Goal Formulation: With patient Time For Goal Achievement: 06/26/22 Potential to Achieve Goals: Good ADL Goals Pt Will Perform Lower Body Bathing: with modified independence;sit to/from stand Pt Will Perform Lower Body Dressing: with modified independence;sit to/from stand Pt Will Transfer to Toilet: with modified independence;bedside commode Pt/caregiver will Perform Home Exercise Program: Increased strength;Both right and left upper extremity;With written HEP provided  OT Frequency: Min 2X/week    Co-evaluation              AM-PAC OT "6 Clicks" Daily Activity     Outcome Measure Help from another person eating meals?: None Help from another person taking care of personal grooming?: None Help from another person toileting, which includes using toliet, bedpan, or urinal?: A Little Help from another person bathing (including washing,  rinsing, drying)?: A Little Help from another person to put on and taking off regular upper body clothing?: None Help from another person to put on and taking off regular lower body clothing?: A Little 6 Click Score: 21   End of Session Equipment Utilized During Treatment: Gait belt;Rolling walker (2 wheels) Nurse Communication: Mobility status  (missing wallet)  Activity Tolerance: Patient tolerated treatment well Patient left: in bed;with call bell/phone within reach  OT Visit Diagnosis: Unsteadiness on feet (R26.81);Other abnormalities of gait and mobility (R26.89);Pain Pain - Right/Left: Right Pain - part of body: Ankle and joints of foot                Time: 0922-0955 OT Time Calculation (min): 33 min Charges:  OT General Charges $OT Visit: 1 Visit OT Evaluation $OT Eval Low Complexity: 1 Low OT Treatments $Self Care/Home Management : 8-22 mins  Alphia Moh OTR/L  Acute Rehab Services  [phone] office number [phone] pager number   Alphia Moh 06/12/2022, 10:12 AM

## 2022-06-13 DIAGNOSIS — S90821A Blister (nonthermal), right foot, initial encounter: Secondary | ICD-10-CM

## 2022-06-13 DIAGNOSIS — L03115 Cellulitis of right lower limb: Secondary | ICD-10-CM | POA: Diagnosis not present

## 2022-06-13 DIAGNOSIS — E1142 Type 2 diabetes mellitus with diabetic polyneuropathy: Secondary | ICD-10-CM | POA: Diagnosis not present

## 2022-06-13 DIAGNOSIS — E1165 Type 2 diabetes mellitus with hyperglycemia: Secondary | ICD-10-CM | POA: Diagnosis not present

## 2022-06-13 LAB — GLUCOSE, CAPILLARY
Glucose-Capillary: 133 mg/dL — ABNORMAL HIGH (ref 70–99)
Glucose-Capillary: 121 mg/dL — ABNORMAL HIGH (ref 70–99)
Glucose-Capillary: 149 mg/dL — ABNORMAL HIGH (ref 70–99)
Glucose-Capillary: 166 mg/dL — ABNORMAL HIGH (ref 70–99)

## 2022-06-13 MED ORDER — CYCLOBENZAPRINE HCL 5 MG PO TABS: 5.0000 mg | ORAL_TABLET | Freq: Three times a day (TID) | ORAL | 0 refills | Status: DC | PRN

## 2022-06-13 MED ORDER — OXYCODONE HCL 10 MG PO TABS: 10.0000 mg | ORAL_TABLET | Freq: Four times a day (QID) | ORAL | 0 refills | Status: DC | PRN

## 2022-06-13 MED ORDER — ACETAMINOPHEN 325 MG PO TABS: 650.0000 mg | ORAL_TABLET | Freq: Four times a day (QID) | ORAL | Status: DC | PRN

## 2022-06-13 NOTE — Discharge Summary (Addendum)
Physician Discharge Summary   Patient: Brett White MRN: [id number] DOB: 1961/09/21  Admit date:     06/10/2022  Discharge date: 06/13/22  Discharge Physician: Brendia Sacks   PCP: Loura Back, NP   Recommendations at discharge:    Right plantar wound healing; recommend referral to outpatient wound care center and close attention to healing. Suggest considering outpatient right lower extremity ABI Consider long-acting insulin  Discharge Diagnoses: Principal Problem:   Cellulitis of right foot Active Problems:   Lactic acidosis   Essential hypertension   Uncontrolled type 2 diabetes mellitus with hyperglycemia, without long-term current use of insulin (HCC)   GERD without esophagitis   Mixed diabetic hyperlipidemia associated with type 2 diabetes mellitus (HCC)   Diabetic polyneuropathy associated with type 2 diabetes mellitus (HCC)  Resolved Problems:   * No resolved hospital problems. 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  Was treated for right plantar surface infection secondary to ruptured blister.  Imaging revealed no evidence of abscess or osteomyelitis.  Subsequently improved and antibiotics were discontinued.  Plan for SNF and ongoing wound care.  *Possible cellulitis right plantar surface of the foot secondary to wound plantar aspect of the right foot in the setting of diabetes mellitus type 2 uncontrolled with hyperglycemia, diabetic neuropathy and suspected peripheral vascular disease. Treated with doxycycline as an outpatient and then with a short course of antibiotics as an inpatient which was subsequently discontinued after discussion with ID. Currently no signs or symptoms of infection.  Appears to be healing. Wound care consultation appreciated, continue wound care, recommend  referral to outpatient wound care center and close attention to healing. Suggest considering outpatient right lower extremity ABI CT imaging showed no evidence of osteomyelitis or abscess Given left AKA, difficult to offload right foot, plan for SNF.   Diabetic polyneuropathy associated with type 2 diabetes mellitus (HCC) Continue home regimen of Lyrica and Cymbalta   Uncontrolled type 2 diabetes mellitus with hyperglycemia, without long-term current use of insulin (HCC), with associated diabetic neuropathy and suspected peripheral vascular disease. Hemoglobin A1c 10.8 Continue metformin, glipizide and Farxiga on discharge   Lactic acidosis Likely secondary to dehydration.  Sepsis ruled out.   Essential hypertension Stable.  Continue oral antihypertensives   GERD without esophagitis Continuing home regimen of daily PPI therapy.   Mixed diabetic hyperlipidemia associated with type 2 diabetes mellitus (HCC) Continuing home regimen of lipid lowering therapy.      Pain control - [employer] Controlled Substance Reporting System database was reviewed.  Consultants:  None Procedures performed:  None  Disposition: Skilled nursing facility Diet recommendation:  Diet Orders (From admission, onward)     Start     Ordered   06/13/22 0000  Diet - low sodium heart healthy        06/13/22 1410   06/11/22 0558  Diet heart healthy/carb modified Room service appropriate? Yes; Fluid consistency: Thin  Diet effective now       Question Answer Comment  Diet-HS Snack? Nothing   Room service appropriate? Yes   Fluid consistency: Thin      06/11/22 0558            DISCHARGE MEDICATION: Allergies as of 06/13/2022   No Known Allergies      Medication List     STOP taking these medications    doxycycline 100  MG capsule Commonly known as: VIBRAMYCIN       TAKE these medications    acetaminophen 325 MG tablet Commonly known as: TYLENOL Take 2 tablets (650 mg total) by  mouth every 6 (six) hours as needed for mild pain (or Fever >/= 101).   amLODipine 10 MG tablet Commonly known as: NORVASC Take 10 mg by mouth every morning.   atorvastatin 20 MG tablet Commonly known as: LIPITOR Take 20 mg by mouth at bedtime.   cyclobenzaprine 5 MG tablet Commonly known as: FLEXERIL Take 1 tablet (5 mg total) by mouth 3 (three) times daily as needed for muscle spasms.   DULoxetine 30 MG capsule Commonly known as: CYMBALTA Take 1 capsule (30 mg total) by mouth daily.   Farxiga 10 MG Tabs tablet Generic drug: dapagliflozin propanediol Take 10 mg by mouth every morning.   furosemide 20 MG tablet Commonly known as: LASIX Take 20 mg by mouth every morning.   glipiZIDE 10 MG tablet Commonly known as: GLUCOTROL Take 10 mg by mouth every morning.   losartan 50 MG tablet Commonly known as: COZAAR Take 1 tablet (50 mg total) by mouth daily.   metFORMIN 500 MG tablet Commonly known as: GLUCOPHAGE Take 1 tablet (500 mg total) by mouth 2 (two) times daily with a meal.   Oxycodone HCl 10 MG Tabs Take 1 tablet (10 mg total) by mouth every 6 (six) hours as needed (pain).   pantoprazole 40 MG tablet Commonly known as: PROTONIX Take 1 tablet (40 mg total) by mouth daily.   Pentips 32G X 4 MM Misc Generic drug: Insulin Pen Needle Use to inject insulin up to 4 times daily as needed.   pregabalin 100 MG capsule Commonly known as: LYRICA Take 100 mg by mouth 2 (two) times daily.               Discharge Care Instructions  (From admission, onward)           Start     Ordered   06/13/22 0000  Discharge wound care:       Comments: Wound care  Every other day    Comments: Cleanse right foot wound with saline, pat dry.  Cut to fit piece of silver hydrofiber (Aquacel Ag+) and place over wound, top with foam dressing. Change every other day     06/13/22 1410            Contact information for after-discharge care     Destination      [facility] Preferred SNF .   Service: Skilled Nursing Contact information: [address] [place] [address] [phone]                    Discharge Exam: Ceasar Mons Weights   06/10/22 2213 06/11/22 0605  Weight: 95.3 kg 100.6 kg   See progress note same day  Condition at discharge: good  The results of significant diagnostics from this hospitalization (including imaging, microbiology, ancillary and laboratory) are listed below for reference.   Imaging Studies: CT FOOT RIGHT W CONTRAST  Result Date: 06/11/2022 CLINICAL DATA:  Wound to sole of right foot with swelling, redness, and tenderness; eval for abscess EXAM: CT OF THE LOWER RIGHT EXTREMITY WITH CONTRAST TECHNIQUE: Multidetector CT imaging of the lower right extremity was performed according to the standard protocol following intravenous contrast administration. RADIATION DOSE REDUCTION: This exam was performed according to the departmental dose-optimization program which includes automated exposure control, adjustment  of the mA and/or kV according to patient size and/or use of iterative reconstruction technique. CONTRAST:  OMNIPAQUE IOHEXOL 300 MG/ML  SOLN COMPARISON:  Radiographs 06/10/2022 FINDINGS: Bones/Joint/Cartilage No acute fracture or dislocation. No evidence of osteomyelitis. Scattered degenerative changes greatest about the midfoot. Calcaneal spurs. Ligaments Suboptimally assessed by CT. Muscles and Tendons Tendons are grossly intact.  Diffuse muscle atrophy. Soft tissues Stranding within the subcutaneous fat of the foot with more confluence edema about the dorsum of the foot. No peripherally enhancing organized collection to suggest abscess. IMPRESSION: Subcutaneous fat stranding about the foot compatible with edema or cellulitis. No evidence of osteomyelitis. No abscess. Electronically Signed   By: Minerva Fester M.D.   On: 06/11/2022 00:55   DG Foot Complete  Right  Result Date: 06/10/2022 CLINICAL DATA:  Heel wound EXAM: RIGHT FOOT COMPLETE - 3+ VIEW COMPARISON:  None Available. FINDINGS: No fracture or dislocation is seen. The joint spaces are preserved. Mild degenerative changes along the dorsal midfoot. Small plantar and posterior calcaneal enthesophytes. No osseous destruction involving the calcaneus in this patient with reported nonhealing heel wound. Mild dorsal soft tissue swelling along the forefoot. IMPRESSION: No radiographic findings of osteomyelitis. Electronically Signed   By: Charline Bills M.D.   On: 06/10/2022 22:53    Microbiology: Results for orders placed or performed during the hospital encounter of 06/10/22  MRSA Next Gen by PCR, Nasal     Status: None   Collection Time: 06/11/22  7:00 AM   Specimen: Nasal Mucosa; Nasal Swab  Result Value Ref Range Status   MRSA by PCR Next Gen NOT DETECTED NOT DETECTED Final    Comment: (NOTE) The GeneXpert MRSA Assay (FDA approved for NASAL specimens only), is one component of a comprehensive MRSA colonization surveillance program. It is not intended to diagnose MRSA infection nor to guide or monitor treatment for MRSA infections. Test performance is not FDA approved in patients less than 41 years old. Performed at [facility], [address], [place], [place] [address]   Culture, blood (Routine X 2) w Reflex to ID Panel     Status: None (Preliminary result)   Collection Time: 06/11/22  9:07 AM   Specimen: BLOOD RIGHT HAND  Result Value Ref Range Status   Specimen Description BLOOD RIGHT HAND  Final   Special Requests   Final    AEROBIC BOTTLE ONLY Blood Culture results may not be optimal due to an inadequate volume of blood received in culture bottles   Culture   Final    NO GROWTH 2 DAYS Performed at [facility], [address] [place], [place] [address]    Report Status PENDING  Incomplete  Culture, blood (Routine X 2) w Reflex to ID Panel     Status: None  (Preliminary result)   Collection Time: 06/11/22  9:09 AM   Specimen: BLOOD RIGHT ARM  Result Value Ref Range Status   Specimen Description BLOOD RIGHT ARM  Final   Special Requests IN PEDIATRIC BOTTLE Blood Culture adequate volume  Final   Culture   Final    NO GROWTH 2 DAYS Performed at [facility], [address] [place], [place] [address]    Report Status PENDING  Incomplete    Labs: CBC: Recent Labs  Lab 06/10/22 2350 06/12/22 0339  WBC 8.3 5.4  NEUTROABS 6.6 3.1  HGB 13.9 12.9*  HCT 41.9 37.6*  MCV 85.2 84.1  PLT 264 248   Basic Metabolic Panel:  Recent Labs  Lab 06/10/22 2350 06/12/22 0339  NA 135 136  K 4.4 4.3  CL 97* 98  CO2 25 26  GLUCOSE 394* 165*  BUN 13 17  CREATININE 1.19 0.95  CALCIUM 9.5 9.0  MG  --  1.8   Liver Function Tests: Recent Labs  Lab 06/12/22 0339  AST 14*  ALT 11  ALKPHOS 73  BILITOT 0.6  PROT 6.1*  ALBUMIN 3.0*   CBG: Recent Labs  Lab 06/12/22 1138 06/12/22 1620 06/12/22 2105 06/13/22 0740 06/13/22 1153  GLUCAP 252* 172* 133* 121* 149*    Discharge time spent: greater than 30 minutes.  Signed: Brendia Sacks, MD [facility] 06/13/2022

## 2022-06-13 NOTE — Hospital Course (Signed)
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  Was treated for right plantar surface infection secondary to ruptured blister.  Imaging revealed no evidence of abscess or osteomyelitis.  Subsequently improved and antibiotics were discontinued.  Plan for SNF and ongoing wound care.

## 2022-06-13 NOTE — Progress Notes (Signed)
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  Was treated for right plantar surface infection secondary to ruptured blister.  Imaging revealed no evidence of abscess or osteomyelitis.  Subsequently improved and antibiotics were discontinued.  Plan for SNF and ongoing wound care.  Assessment and Plan: *Possible cellulitis right plantar surface of the foot secondary to wound plantar aspect of the right foot in the setting of diabetes mellitus type 2 uncontrolled with hyperglycemia, diabetic neuropathy and suspected peripheral vascular disease. Treated with doxycycline as an outpatient and then with a short course of antibiotics as an inpatient which was subsequently discontinued after discussion with ID. Currently no signs or symptoms of infection.  Appears to be healing. Wound care consultation appreciated, continue wound care, recommend referral to outpatient wound care center and close attention to healing. Will check right lower extremity ABI CT imaging showed no evidence of osteomyelitis or abscess Given left AKA, difficult to offload right foot, plan for SNF.  Diabetic polyneuropathy associated with type 2 diabetes mellitus (HCC) Continue home regimen of Lyrica and Cymbalta  Uncontrolled type 2 diabetes mellitus with hyperglycemia, without long-term current use of insulin (HCC), with associated diabetic neuropathy and suspected peripheral vascular disease. Hemoglobin A1c 10.8 Continue current regimen  Lactic acidosis Likely secondary to dehydration.  Sepsis ruled out.  Essential hypertension Stable.  Continue oral antihypertensives  GERD without  esophagitis Continuing home regimen of daily PPI therapy.  Mixed diabetic hyperlipidemia associated with type 2 diabetes mellitus (HCC) Continuing home regimen of lipid lowering therapy.     Subjective:  Continues to have pain in right foot Has neuropathy right leg No injury to the foot as an outpatient although he did walk to the store in flip-flops and did walk torsemide bottle.  Physical Exam: Vitals:   06/12/22 1620 06/12/22 2013 06/13/22 0526 06/13/22 0803  BP: 130/82 102/81 (!) 159/98 (!) 170/99  Pulse: 73 78 74 73  Resp: 15 16 18 18   Temp: 97.6 F (36.4 C) 98.2 F (36.8 C) 98 F (36.7 C) 97.6 F (36.4 C)  TempSrc: Oral Oral  Oral  SpO2: 95% 94% 100% 98%  Weight:      Height:       Physical Exam Vitals reviewed.  Constitutional:      General: He is not in acute distress.    Appearance: He is not ill-appearing or toxic-appearing.  Cardiovascular:     Rate and Rhythm: Normal rate and regular rhythm.     Heart sounds: No murmur heard. Pulmonary:     Effort: Pulmonary effort is normal. No respiratory distress.     Breath sounds: No wheezing, rhonchi or rales.  Skin:    Comments: Right foot wound on plantar surface Pink, appears to be healing.  No evidence of infection or complicating feature.  Neurological:     Mental Status: He is alert.  Psychiatric:        Behavior: Behavior normal.     Data Reviewed:  UOP 2150 CBG stable  Family Communication: none  Disposition: Status is: Inpatient Remains inpatient appropriate because: awaiting SNF  Planned Discharge Destination: Skilled nursing facility    Time spent: 20 minutes  Author: [id number], MD 06/13/2022  12:50 PM  For on call review [email or web address].

## 2022-06-13 NOTE — TOC Progression Note (Addendum)
Transition of Care [facility]) - Progression Note    Patient Details  Name: Brett White MRN: [id number] Date of Birth: 05/12/1961  Transition [facility] Care [facility]) CM/SW Contact  Janae Bridgeman, RN Phone Number: 06/13/2022, 9:58 AM  Clinical Narrative:    CM spoke with Brett White, CM at [facility] ([place] and insurance authorization was started for the patient for placement - pending authorization approval at this time.  Attending MD was updated.  06/13/2022 1357 - I called and spoke with Brett White, CM at [facility] and the patient has been approved by insurance provider to admission to [facility].  I updated MD to discuss likely discharge to the facility once medically stable for discharge.  PTAR was called for transportation to the facility.  Discharge summary and orders were updated in the hub for the facility.  PTAR packet to include discharge summary, Medical necessity and Facesheet.  Bedside nursing - Please call report to [facility] at [phone] - Patient will be admitted to Room 124.   Expected Discharge Plan: Home w Home Health Services Barriers to Discharge: Continued Medical Work up  Expected Discharge Plan and Services Expected Discharge Plan: Home w Home Health Services   Discharge Planning Services: CM Consult   Living arrangements for the past 2 months: Apartment                                       Social Determinants of Health (SDOH) Interventions    Readmission Risk Interventions     No data to display

## 2022-06-16 LAB — CULTURE, BLOOD (ROUTINE X 2)
Culture: NO GROWTH
Culture: NO GROWTH
Special Requests: ADEQUATE

## 2022-06-18 ENCOUNTER — Emergency Department (HOSPITAL_COMMUNITY)
Admission: EM | Admit: 2022-06-18 | Discharge: 2022-06-18 | Disposition: A | Discharge status: Skilled Nursing Facility | Payer: Medicare Other | Attending: Emergency Medicine | Admitting: Emergency Medicine

## 2022-06-18 ENCOUNTER — Other Ambulatory Visit: Payer: Self-pay

## 2022-06-18 ENCOUNTER — Encounter (HOSPITAL_COMMUNITY): Payer: Self-pay

## 2022-06-18 ENCOUNTER — Emergency Department (HOSPITAL_COMMUNITY): Payer: Medicare Other

## 2022-06-18 DIAGNOSIS — W010XXA Fall on same level from slipping, tripping and stumbling without subsequent striking against object, initial encounter: Secondary | ICD-10-CM | POA: Diagnosis not present

## 2022-06-18 DIAGNOSIS — S060X0A Concussion without loss of consciousness, initial encounter: Secondary | ICD-10-CM

## 2022-06-18 DIAGNOSIS — R944 Abnormal results of kidney function studies: Secondary | ICD-10-CM | POA: Diagnosis not present

## 2022-06-18 DIAGNOSIS — Z79899 Other long term (current) drug therapy: Secondary | ICD-10-CM | POA: Insufficient documentation

## 2022-06-18 DIAGNOSIS — I10 Essential (primary) hypertension: Secondary | ICD-10-CM | POA: Diagnosis not present

## 2022-06-18 DIAGNOSIS — S0990XA Unspecified injury of head, initial encounter: Secondary | ICD-10-CM | POA: Diagnosis present

## 2022-06-18 DIAGNOSIS — Y92129 Unspecified place in nursing home as the place of occurrence of the external cause: Secondary | ICD-10-CM | POA: Insufficient documentation

## 2022-06-18 DIAGNOSIS — E1140 Type 2 diabetes mellitus with diabetic neuropathy, unspecified: Secondary | ICD-10-CM | POA: Diagnosis not present

## 2022-06-18 DIAGNOSIS — Z7984 Long term (current) use of oral hypoglycemic drugs: Secondary | ICD-10-CM | POA: Diagnosis not present

## 2022-06-18 DIAGNOSIS — E1165 Type 2 diabetes mellitus with hyperglycemia: Secondary | ICD-10-CM | POA: Diagnosis not present

## 2022-06-18 LAB — COMPREHENSIVE METABOLIC PANEL
ALT: 18 U/L (ref 0–44)
AST: 19 U/L (ref 15–41)
Albumin: 3.8 g/dL (ref 3.5–5.0)
Alkaline Phosphatase: 73 U/L (ref 38–126)
Anion gap: 9 (ref 5–15)
BUN: 25 mg/dL — ABNORMAL HIGH (ref 6–20)
CO2: 25 mmol/L (ref 22–32)
Calcium: 9.2 mg/dL (ref 8.9–10.3)
Chloride: 105 mmol/L (ref 98–111)
Creatinine, Ser: 1.32 mg/dL — ABNORMAL HIGH (ref 0.61–1.24)
GFR, Estimated: >60 mL/min (ref >60)
Glucose, Bld: 194 mg/dL — ABNORMAL HIGH (ref 70–99)
Potassium: 4.1 mmol/L (ref 3.5–5.1)
Sodium: 139 mmol/L (ref 135–145)
Total Bilirubin: <0.1 mg/dL — ABNORMAL LOW (ref 0.3–1.2)
Total Protein: 7.2 g/dL (ref 6.5–8.1)

## 2022-06-18 LAB — CBC WITH DIFFERENTIAL/PLATELET
Abs Immature Granulocytes: 0.01 10*3/uL (ref 0.00–0.07)
Basophils Absolute: 0 10*3/uL (ref 0.0–0.1)
Basophils Relative: 1 %
Eosinophils Absolute: 0.2 10*3/uL (ref 0.0–0.5)
Eosinophils Relative: 3 %
HCT: 42.5 % (ref 39.0–52.0)
Hemoglobin: 13.6 g/dL (ref 13.0–17.0)
Immature Granulocytes: 0 %
Lymphocytes Relative: 27 %
Lymphs Abs: 1.7 10*3/uL (ref 0.7–4.0)
MCH: 27.6 pg (ref 26.0–34.0)
MCHC: 32 g/dL (ref 30.0–36.0)
MCV: 86.4 fL (ref 80.0–100.0)
Monocytes Absolute: 0.5 10*3/uL (ref 0.1–1.0)
Monocytes Relative: 8 %
Neutro Abs: 3.9 10*3/uL (ref 1.7–7.7)
Neutrophils Relative %: 61 %
Platelets: 316 10*3/uL (ref 150–400)
RBC: 4.92 MIL/uL (ref 4.22–5.81)
RDW: 12.7 % (ref 11.5–15.5)
WBC: 6.3 10*3/uL (ref 4.0–10.5)
nRBC: 0 % (ref 0.0–0.2)

## 2022-06-18 LAB — CBG MONITORING, ED: Glucose-Capillary: 214 mg/dL — ABNORMAL HIGH (ref 70–99)

## 2022-06-18 LAB — MAGNESIUM: Magnesium: 2 mg/dL (ref 1.7–2.4)

## 2022-06-18 MED ORDER — METOCLOPRAMIDE HCL 5 MG/ML IJ SOLN
10.0000 mg | Freq: Once | INTRAMUSCULAR | Status: AC
  Administered 2022-06-18: 10 mg via INTRAVENOUS
  Filled 2022-06-18: qty 2

## 2022-06-18 MED ORDER — KETOROLAC TROMETHAMINE 15 MG/ML IJ SOLN
15.0000 mg | Freq: Once | INTRAMUSCULAR | Status: AC
  Administered 2022-06-18: 15 mg via INTRAVENOUS
  Filled 2022-06-18: qty 1

## 2022-06-18 MED ORDER — DIPHENHYDRAMINE HCL 50 MG/ML IJ SOLN
12.5000 mg | Freq: Once | INTRAMUSCULAR | Status: AC
  Administered 2022-06-18: 12.5 mg via INTRAVENOUS
  Filled 2022-06-18: qty 1

## 2022-06-18 MED ORDER — LACTATED RINGERS IV BOLUS
1000.0000 mL | Freq: Once | INTRAVENOUS | Status: AC
  Administered 2022-06-18: 1000 mL via INTRAVENOUS

## 2022-06-18 NOTE — ED Triage Notes (Addendum)
Patient reports he fell 3 days ago and now headache is worse and pain on both temples and pressure behind right eye.  No thinners.  No LOc patient is already requesting pain meds baseline oriented to self only.  Hx of stroke

## 2022-06-18 NOTE — ED Provider Triage Note (Signed)
Emergency Medicine Provider Triage Evaluation Note  Brett White , a 61 y.o. male  was evaluated in triage.  Pt complains of fall.  Patient states he had a mechanical fall on Friday.  Hit the right side of his head.  He continues to have a headache, right eye pain and neck pain.  He denies any syncope or presyncopal symptoms.  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  Typically walks with a walker however occasionally uses a wheelchair.  Resides at a nursing facility. No numbness, weakness, CP, abd pain, lower back pain. Denies LOC, anticoagulation.   Review of Systems  Positive: Fall, HA, neck pain Negative:   Physical Exam  BP (!) 164/101 (BP Location: Right Arm)   Pulse 85   Temp 98.1 F (36.7 C) (Oral)   Resp 17   Ht 6\' 2"  (1.88 m)   Wt 100.2 kg   SpO2 100%   BMI 28.37 kg/m  Gen:   Awake, no distress   Eye:  No traumatic hyphema.  Pupils 3 mm bilaterally, reactive to light, full range of motion without difficulty Resp:  Normal effort  MSK:   Moves extremities without difficulty, LLE amputation Other:    Medical Decision Making  Medically screening exam initiated at 12:27 PM.  Appropriate orders placed.  Brett White was informed that the remainder of the evaluation will be completed by another provider, this initial triage assessment does not replace that evaluation, and the importance of remaining in the ED until their evaluation is complete.  Fall, HA   Brett White A, PA-C 06/18/22 1229

## 2022-06-18 NOTE — ED Provider Notes (Signed)
[facility] EMERGENCY DEPARTMENT Provider Note   CSN: [id number] Arrival date & time: 06/18/22  1202     History  Chief Complaint  Patient presents with   Brett White is a 61 y.o. male.   Fall Associated symptoms include headaches.  Patient presents for headache and medical history includes DM, osteomyelitis, HTN, neuropathy, GERD, HLD, CVA.  He had a recent hospitalization for cellulitis.  He was discharged 5 days ago to a skilled nursing facility.  He states that he had a fall 3 days ago.  He describes the fall as mechanical and caused by his foot getting caught in his wheelchair as he was attempting to get up.  When he fell, he struck the right side of his forehead on the ground.  Starting today, he began having a right-sided headache.  He endorses associated nausea and photophobia.  He has not taken anything for analgesia today.  Patient reports poor sleep last night and very little p.o. intake today.  He denies any other recent symptoms.  At baseline, he walks with a walker and sometimes uses a wheelchair.       Home Medications Prior to Admission medications   Medication Sig Start Date End Date Taking? Authorizing Provider  acetaminophen (TYLENOL) 325 MG tablet Take 2 tablets (650 mg total) by mouth every 6 (six) hours as needed for mild pain (or Fever >/= 101). 06/13/22   Samuella Cota, MD  amLODipine (NORVASC) 10 MG tablet Take 10 mg by mouth every morning. 05/11/22   [provider]  atorvastatin (LIPITOR) 20 MG tablet Take 20 mg by mouth at bedtime. 11/07/21   [provider]  cyclobenzaprine (FLEXERIL) 5 MG tablet Take 1 tablet (5 mg total) by mouth 3 (three) times daily as needed for muscle spasms. 06/13/22   Samuella Cota, MD  DULoxetine (CYMBALTA) 30 MG capsule Take 1 capsule (30 mg total) by mouth daily. 10/20/21   Ghimire, Henreitta Leber, MD  FARXIGA 10 MG TABS tablet Take 10 mg by mouth every morning. 02/07/22   [provider]  furosemide (LASIX) 20 MG tablet Take 20 mg by mouth every morning. 05/11/22   [provider]  glipiZIDE (GLUCOTROL) 10 MG tablet Take 10 mg by mouth every morning. 05/11/22   [provider]  Insulin Pen Needle 32G X 4 MM MISC Use to inject insulin up to 4 times daily as needed. 10/20/21   Ghimire, Henreitta Leber, MD  losartan (COZAAR) 50 MG tablet Take 1 tablet (50 mg total) by mouth daily. 02/24/22   Charlynne Cousins, MD  metFORMIN (GLUCOPHAGE) 500 MG tablet Take 1 tablet (500 mg total) by mouth 2 (two) times daily with a meal. 06/07/22   Palumbo, April, MD  Oxycodone HCl 10 MG TABS Take 1 tablet (10 mg total) by mouth every 6 (six) hours as needed (pain). 06/13/22   Samuella Cota, MD  pantoprazole (PROTONIX) 40 MG tablet Take 1 tablet (40 mg total) by mouth daily. 10/20/21   Ghimire, Henreitta Leber, MD  pregabalin (LYRICA) 100 MG capsule Take 100 mg by mouth 2 (two) times daily. 02/03/22   [provider]      Allergies    Patient has no known allergies.    Review of Systems   Review of Systems  Constitutional:  Positive for appetite change and fatigue.  Eyes:  Positive for photophobia.  Gastrointestinal:  Positive for nausea.  Neurological:  Positive for headaches.  Psychiatric/Behavioral:  Positive for sleep disturbance.   All other systems reviewed and are negative.   Physical Exam Updated Vital Signs BP (!) 117/92   Pulse 69   Temp 97.8 F (36.6 C) (Oral)   Resp 18   Ht 6\' 2"  (1.88 m)   Wt 100.2 kg   SpO2 97%   BMI 28.37 kg/m  Physical Exam Vitals and nursing note reviewed.  Constitutional:      General: He is not in acute distress.    Appearance: Normal appearance. He is well-developed and normal weight. He is not ill-appearing, toxic-appearing or diaphoretic.  HENT:     Head: Normocephalic and atraumatic.     Right Ear: External ear normal.     Left Ear: External ear normal.     Nose: Nose normal.     Mouth/Throat:     Mouth:  Mucous membranes are moist.     Pharynx: Oropharynx is clear.  Eyes:     General: No scleral icterus.    Extraocular Movements: Extraocular movements intact.     Conjunctiva/sclera: Conjunctivae normal.  Cardiovascular:     Rate and Rhythm: Normal rate and regular rhythm.     Heart sounds: No murmur heard. Pulmonary:     Effort: Pulmonary effort is normal. No respiratory distress.     Breath sounds: Normal breath sounds. No wheezing or rales.  Chest:     Chest wall: No tenderness.  Abdominal:     General: There is no distension.     Palpations: Abdomen is soft.     Tenderness: There is no abdominal tenderness.  Musculoskeletal:        General: No swelling or deformity. Normal range of motion.     Cervical back: Normal range of motion and neck supple. No rigidity.     Comments: Left BKA  Skin:    General: Skin is warm and dry.     Capillary Refill: Capillary refill takes less than 2 seconds.     Comments: Healing wound to plantar surface of right foot  Neurological:     General: No focal deficit present.     Mental Status: He is alert. Mental status is at baseline.     Cranial Nerves: No cranial nerve deficit.     Sensory: No sensory deficit.     Motor: No weakness.     Coordination: Coordination normal.  Psychiatric:        Mood and Affect: Mood normal.        Behavior: Behavior normal.        Thought Content: Thought content normal.        Judgment: Judgment normal.      ED Results / Procedures / Treatments   Labs (all labs ordered are listed, but only abnormal results are displayed) Labs Reviewed  COMPREHENSIVE METABOLIC PANEL - Abnormal; Notable for the following components:      Result Value   Glucose, Bld 194 (*)    BUN 25 (*)    Creatinine, Ser 1.32 (*)    Total Bilirubin <0.1 (*)    All other components within normal limits  CBG MONITORING, ED - Abnormal; Notable for the following components:   Glucose-Capillary 214 (*)    All other components within  normal limits  CBC WITH DIFFERENTIAL/PLATELET  MAGNESIUM    EKG None  Radiology CT HEAD WO CONTRAST (5MM)  Result Date: 06/18/2022 CLINICAL DATA:  Head and neck pain.  Trauma. EXAM: CT HEAD WITHOUT CONTRAST CT CERVICAL SPINE WITHOUT  CONTRAST TECHNIQUE: Multidetector CT imaging of the head and cervical spine was performed following the standard protocol without intravenous contrast. Multiplanar CT image reconstructions of the cervical spine were also generated. RADIATION DOSE REDUCTION: This exam was performed according to the departmental dose-optimization program which includes automated exposure control, adjustment of the mA and/or kV according to patient size and/or use of iterative reconstruction technique. COMPARISON:  Head CT 12/13/2021 FINDINGS: CT HEAD FINDINGS Brain: No evidence of acute infarction, hemorrhage, hydrocephalus, extra-axial collection or mass lesion/mass effect. Stable bilateral symmetric low attenuation in the region of the globus pallidus. Vascular: No hyperdense vessel or unexpected calcification. Skull: No fracture or bone lesion. Sinuses/Orbits: The paranasal sinuses and mastoid air cells are clear. The globes are intact. Other: No scalp lesions or scalp hematoma. CT CERVICAL SPINE FINDINGS Alignment: Normal overall alignment. Skull base and vertebrae: No acute fracture. Soft tissues and spinal canal: No prevertebral fluid or swelling. No visible canal hematoma. Disc levels: Advanced degenerative cervical spondylosis for age with significant disc disease and facet disease. This is most significant at C5-6 and C6-7. Mild spinal and foraminal stenosis. Upper chest: The lung apices are grossly clear. Other: No neck mass, hematoma or adenopathy. Small scattered nodes are likely reactive. IMPRESSION: 1. No acute intracranial findings or skull fracture. 2. Advanced degenerative cervical spondylosis for age but no acute cervical spine fracture. Electronically Signed   By: Rudie Meyer M.D.   On: 06/18/2022 15:23   CT Cervical Spine Wo Contrast  Result Date: 06/18/2022 CLINICAL DATA:  Head and neck pain.  Trauma. EXAM: CT HEAD WITHOUT CONTRAST CT CERVICAL SPINE WITHOUT CONTRAST TECHNIQUE: Multidetector CT imaging of the head and cervical spine was performed following the standard protocol without intravenous contrast. Multiplanar CT image reconstructions of the cervical spine were also generated. RADIATION DOSE REDUCTION: This exam was performed according to the departmental dose-optimization program which includes automated exposure control, adjustment of the mA and/or kV according to patient size and/or use of iterative reconstruction technique. COMPARISON:  Head CT 12/13/2021 FINDINGS: CT HEAD FINDINGS Brain: No evidence of acute infarction, hemorrhage, hydrocephalus, extra-axial collection or mass lesion/mass effect. Stable bilateral symmetric low attenuation in the region of the globus pallidus. Vascular: No hyperdense vessel or unexpected calcification. Skull: No fracture or bone lesion. Sinuses/Orbits: The paranasal sinuses and mastoid air cells are clear. The globes are intact. Other: No scalp lesions or scalp hematoma. CT CERVICAL SPINE FINDINGS Alignment: Normal overall alignment. Skull base and vertebrae: No acute fracture. Soft tissues and spinal canal: No prevertebral fluid or swelling. No visible canal hematoma. Disc levels: Advanced degenerative cervical spondylosis for age with significant disc disease and facet disease. This is most significant at C5-6 and C6-7. Mild spinal and foraminal stenosis. Upper chest: The lung apices are grossly clear. Other: No neck mass, hematoma or adenopathy. Small scattered nodes are likely reactive. IMPRESSION: 1. No acute intracranial findings or skull fracture. 2. Advanced degenerative cervical spondylosis for age but no acute cervical spine fracture. Electronically Signed   By: Rudie Meyer M.D.   On: 06/18/2022 15:23     Procedures Procedures    Medications Ordered in ED Medications  lactated ringers bolus 1,000 mL (0 mLs Intravenous Stopped 06/18/22 1859)  metoCLOPramide (REGLAN) injection 10 mg (10 mg Intravenous Given 06/18/22 1704)  diphenhydrAMINE (BENADRYL) injection 12.5 mg (12.5 mg Intravenous Given 06/18/22 1702)  ketorolac (TORADOL) 15 MG/ML injection 15 mg (15 mg Intravenous Given 06/18/22 1703)    ED Course/ Medical Decision Making/ A&P  Medical Decision Making Amount and/or Complexity of Data Reviewed Labs: ordered.  Risk Prescription drug management.   This patient presents to the ED for concern of headache, this involves an extensive number of treatment options, and is a complaint that carries with it a high risk of complications and morbidity.  The differential diagnosis includes concussion, SDH, SAH, ICH, migraine headache, dehydration   Co morbidities that complicate the patient evaluation  DM, osteomyelitis, HTN, neuropathy, GERD, HLD, CVA   Additional history obtained:  Additional history obtained from N/A External records from outside source obtained and reviewed including EMR   Lab Tests:  I Ordered, and personally interpreted labs.  The pertinent results include: Slight increase in creatinine from baseline, normal electrolytes, normal hemoglobin, no leukocytosis.  Blood sugar only mildly elevated.   Imaging Studies ordered:  I ordered imaging studies including CT of head and cervical spine I independently visualized and interpreted imaging which showed no acute findings I agree with the radiologist interpretation   Cardiac Monitoring: / EKG:  The patient was maintained on a cardiac monitor.  I personally viewed and interpreted the cardiac monitored which showed an underlying rhythm of: Sinus rhythm    Problem List / ED Course / Critical interventions / Medication management  Patient is a 61 year old male presenting for headache.   Onset of headache was today.  He reports a recent fall 3 days ago during which she struck the right side of his head against the floor.  Headache today is right-sided.  He has associated nausea and photophobia.  Prior to being bedded in the ED, patient underwent CT imaging of head and cervical spine.  Results are negative for acute injuries.  On assessment, patient is alert and oriented.  He has ongoing headache and has not taken anything for analgesia today.  He has no focal neurologic deficits.  He endorses current nausea.  Patient was ordered IV fluids, Toradol, Reglan, and Benadryl for symptomatic relief.  Given his history of uncontrolled diabetes, will check lab work as well.  On reassessment, patient had improved headache pain.  Laboratory work-up is reassuring.  He does have a slight increase in creatinine from baseline but has received IV fluids.  Patient was discharged in stable condition. I ordered medication including IV fluids, Reglan, Benadryl, Toradol for symptomatic relief Reevaluation of the patient after these medicines showed that the patient improved I have reviewed the patients home medicines and have made adjustments as needed   Social Determinants of Health:  Currently resides in nursing facility due to poor mobility and memory deficits        Final Clinical Impression(s) / ED Diagnoses Final diagnoses:  Concussion without loss of consciousness, initial encounter    Rx / DC Orders ED Discharge Orders     None         Gloris Manchester, MD 06/18/22 1951

## 2022-06-18 NOTE — ED Notes (Signed)
Attempted to call report to [facility], was placed on hold nobody picked back up to take report

## 2022-06-18 NOTE — Discharge Instructions (Signed)
Take Tylenol as needed for headaches.  Avoid activities that worsen your headache pain.  Return to emergency department at any time for any new or worsening symptoms of concern.

## 2022-07-25 ENCOUNTER — Emergency Department (HOSPITAL_COMMUNITY)
Admission: EM | Admit: 2022-07-25 | Discharge: 2022-07-26 | Disposition: A | Discharge status: Home / Self Care | Payer: Medicare Other | Attending: Emergency Medicine | Admitting: Emergency Medicine

## 2022-07-25 ENCOUNTER — Other Ambulatory Visit: Payer: Self-pay

## 2022-07-25 DIAGNOSIS — Z794 Long term (current) use of insulin: Secondary | ICD-10-CM | POA: Diagnosis not present

## 2022-07-25 DIAGNOSIS — E119 Type 2 diabetes mellitus without complications: Secondary | ICD-10-CM | POA: Insufficient documentation

## 2022-07-25 DIAGNOSIS — Z7984 Long term (current) use of oral hypoglycemic drugs: Secondary | ICD-10-CM | POA: Insufficient documentation

## 2022-07-25 DIAGNOSIS — I10 Essential (primary) hypertension: Secondary | ICD-10-CM | POA: Diagnosis not present

## 2022-07-25 DIAGNOSIS — R1110 Vomiting, unspecified: Secondary | ICD-10-CM | POA: Diagnosis not present

## 2022-07-25 DIAGNOSIS — S20469A Insect bite (nonvenomous) of unspecified back wall of thorax, initial encounter: Secondary | ICD-10-CM | POA: Diagnosis present

## 2022-07-25 DIAGNOSIS — W57XXXA Bitten or stung by nonvenomous insect and other nonvenomous arthropods, initial encounter: Secondary | ICD-10-CM | POA: Insufficient documentation

## 2022-07-25 DIAGNOSIS — Z79899 Other long term (current) drug therapy: Secondary | ICD-10-CM | POA: Diagnosis not present

## 2022-07-25 DIAGNOSIS — M25511 Pain in right shoulder: Secondary | ICD-10-CM | POA: Diagnosis not present

## 2022-07-25 LAB — COMPREHENSIVE METABOLIC PANEL
ALT: 19 U/L (ref 0–44)
AST: 22 U/L (ref 15–41)
Albumin: 3.9 g/dL (ref 3.5–5.0)
Alkaline Phosphatase: 91 U/L (ref 38–126)
Anion gap: 12 (ref 5–15)
BUN: 25 mg/dL — ABNORMAL HIGH (ref 6–20)
CO2: 27 mmol/L (ref 22–32)
Calcium: 9.5 mg/dL (ref 8.9–10.3)
Chloride: 101 mmol/L (ref 98–111)
Creatinine, Ser: 1.25 mg/dL — ABNORMAL HIGH (ref 0.61–1.24)
GFR, Estimated: >60 mL/min (ref >60)
Glucose, Bld: 229 mg/dL — ABNORMAL HIGH (ref 70–99)
Potassium: 4.3 mmol/L (ref 3.5–5.1)
Sodium: 140 mmol/L (ref 135–145)
Total Bilirubin: 0.6 mg/dL (ref 0.3–1.2)
Total Protein: 7.6 g/dL (ref 6.5–8.1)

## 2022-07-25 LAB — URINALYSIS, ROUTINE W REFLEX MICROSCOPIC
Bacteria, UA: NONE SEEN
Bilirubin Urine: NEGATIVE
Glucose, UA: >=500 mg/dL — AB
Hgb urine dipstick: NEGATIVE
Ketones, ur: NEGATIVE mg/dL
Leukocytes,Ua: NEGATIVE
Nitrite: NEGATIVE
Protein, ur: NEGATIVE mg/dL
Specific Gravity, Urine: 1.024 (ref 1.005–1.030)
pH: 5 (ref 5.0–8.0)

## 2022-07-25 LAB — CBC WITH DIFFERENTIAL/PLATELET
Abs Immature Granulocytes: 0.02 10*3/uL (ref 0.00–0.07)
Basophils Absolute: 0.1 10*3/uL (ref 0.0–0.1)
Basophils Relative: 1 %
Eosinophils Absolute: 0.4 10*3/uL (ref 0.0–0.5)
Eosinophils Relative: 6 %
HCT: 44.7 % (ref 39.0–52.0)
Hemoglobin: 14 g/dL (ref 13.0–17.0)
Immature Granulocytes: 0 %
Lymphocytes Relative: 30 %
Lymphs Abs: 1.6 10*3/uL (ref 0.7–4.0)
MCH: 27.1 pg (ref 26.0–34.0)
MCHC: 31.3 g/dL (ref 30.0–36.0)
MCV: 86.6 fL (ref 80.0–100.0)
Monocytes Absolute: 0.5 10*3/uL (ref 0.1–1.0)
Monocytes Relative: 9 %
Neutro Abs: 2.9 10*3/uL (ref 1.7–7.7)
Neutrophils Relative %: 54 %
Platelets: 310 10*3/uL (ref 150–400)
RBC: 5.16 MIL/uL (ref 4.22–5.81)
RDW: 12.9 % (ref 11.5–15.5)
WBC: 5.5 10*3/uL (ref 4.0–10.5)
nRBC: 0 % (ref 0.0–0.2)

## 2022-07-25 MED ORDER — MUPIROCIN CALCIUM 2 % EX CREA: 1.0000 | TOPICAL_CREAM | Freq: Two times a day (BID) | CUTANEOUS | 0 refills | Status: DC

## 2022-07-25 MED ORDER — OXYCODONE-ACETAMINOPHEN 5-325 MG PO TABS
1.0000 | ORAL_TABLET | Freq: Once | ORAL | Status: AC
  Administered 2022-07-25: 1 via ORAL
  Filled 2022-07-25: qty 1

## 2022-07-25 MED ORDER — DIPHENHYDRAMINE-ZINC ACETATE 2-0.1 % EX CREA
TOPICAL_CREAM | Freq: Once | CUTANEOUS | Status: DC
  Filled 2022-07-25: qty 28

## 2022-07-25 MED ORDER — IBUPROFEN 400 MG PO TABS: 600.0000 mg | ORAL_TABLET | Freq: Once | ORAL | Status: DC

## 2022-07-25 MED ORDER — MUPIROCIN CALCIUM 2 % EX CREA
TOPICAL_CREAM | Freq: Once | CUTANEOUS | Status: DC
  Filled 2022-07-25: qty 15

## 2022-07-25 NOTE — Progress Notes (Addendum)
CSW spoke with pt about what happened pt stated that 15 minutes after being wheeled into the lobby and being told to find his own transportation back to [place], Randall Hiss returned and took pt back to a room for him to await PTAR.  Pt was told that PTAR would be transporting pt back to [place].  CSW then apologized for pts experience and stated to pt if he needs any further assistance to contact me and CSW provided pt with phone #.  Pt is currently awaiting PTAR for transportation back to [place].  Carizma Dunsworth Tarpley-Carter, MSW, LCSW-A Pronouns:  She/Her/Hers [facility] Care Clinical Social Worker Direct Number:  [phone] [email or web address]

## 2022-07-25 NOTE — Progress Notes (Deleted)
07/25/2022 @ 7:08pm  TOC CSW contacted Debrisa, RN on [employer] at [place] to update her on pts status.  She was pleased.  Call ended.  Dalia Jollie Tarpley-Carter, MSW, LCSW-A Pronouns:  She/Her/Hers [facility] Clinical Social Worker Direct Number:  [phone] [email or web address]

## 2022-07-25 NOTE — Progress Notes (Signed)
TOC CSW contacted Debrisa, RN on [employer] at [place] to update her on pts status.  She was pleased.  Call ended.  Mirabella Hilario Tarpley-Carter, MSW, LCSW-A Pronouns:  She/Her/Hers [facility] of Care Clinical Social Worker Direct Number:  [phone] [email or web address]

## 2022-07-25 NOTE — ED Provider Triage Note (Signed)
Emergency Medicine Provider Triage Evaluation Note  Brett White , a 61 y.o. male  was evaluated in triage.  Pt complains of possible insect bite to his back on the right side.  Patient states he felt something sharp while outside on Friday.  He feels that he brushed it off.  On approximately Monday he was seen by his facilities provider who prescribed an unknown antibiotic for possible infection.  Patient states the pain is increased at that time.  He also complains of 2 days of feeling tired and sick with low appetite.  He denies abdominal pain, nausea, vomiting, diarrhea.  Denies chest pain, shortness of breath.  Of note.  Patient is type II diabetic.  He states he has been compliant with his metformin.  He does not take insulin.  He has not been eating much but EMS noted a capillary blood glucose of over 350 this morning..  Review of Systems  Positive: As above Negative: As above  Physical Exam  BP (!) 146/91 (BP Location: Right Arm)   Pulse 86   Temp 97.7 F (36.5 C) (Oral)   Resp 16   SpO2 97%  Gen:   Awake, no distress   Resp:  Normal effort  MSK:   Moves extremities without difficulty  Other:  Possible abscess noted to patient's back on the right side in the thoracic level, swelling and mild erythema noted  Medical Decision Making  Medically screening exam initiated at 9:58 AM.  Appropriate orders placed.  Tavares Levinson was informed that the remainder of the evaluation will be completed by another provider, this initial triage assessment does not replace that evaluation, and the importance of remaining in the ED until their evaluation is complete.     Dorothyann Peng, PA-C 07/25/22 1000

## 2022-07-25 NOTE — Progress Notes (Signed)
TOC CSW received a call from [place], Therapist, sports for [employer] at [place].  Debrisa stated that pt was sat in the lobby and told to find his own transportation to [place].  Debrisa inquired about CSW getting pt a ride back to [place].  CSW stated to Debrisa that she would look into situation and get pt back to [place].  Antwoin Lackey Tarpley-Carter, MSW, LCSW-A Pronouns:  She/Her/Hers [facility] of [facility] Clinical Social Worker Direct Number:  [phone] [email or web address]

## 2022-07-25 NOTE — Progress Notes (Deleted)
07/25/2022 @ 6:43pm  TOC CSW received a call from [place], Therapist, sports for [employer] at [place].  Debrisa stated that pt was sat in the lobby and told to find his own transportation to [place].  Debrisa inquried about CSW getting pt a ride back to [place].  CSW stated to Debrisa that she would look into situation and get pt back to [place].  Alver Leete Tarpley-Carter, MSW, LCSW-A Pronouns:  She/Her/Hers [facility] Health[facility] of Care Clinical Social Worker Direct Number:  [phone] [email or web address]

## 2022-07-25 NOTE — ED Notes (Signed)
Report received from Charisse March pt is discharged, "does not need anything" and pending PTAR for transport.

## 2022-07-25 NOTE — ED Triage Notes (Signed)
EMS stated, he was bitten on Friday and has been weaker, more malaise, feels sick. He is on antibiotic from the bug bite.

## 2022-07-25 NOTE — Discharge Instructions (Signed)
Evaluation for your insect bite revealed that you may have cellulitis which is a skin infection around the insect bite.  Recommend that you apply mupirocin cream 2 times daily for the next 5 to 7 days.  Mupirocin is a topical antibiotic.  If you have new fever, nausea vomiting diarrhea please return to emergency department for further evaluation.  Otherwise, recommend he follow-up with your PCP for wound reevaluation in 3 to 5 days.

## 2022-07-25 NOTE — ED Provider Notes (Signed)
[facility] EMERGENCY DEPARTMENT Provider Note   CSN: [id number] Arrival date & time: 07/25/22  2952     History  Chief Complaint  Patient presents with   Insect Bite   HPI Brett White is a 61 y.o. male with type 2 diabetes and hypertension presenting for insect bite. This occurred on Friday. He was standing outside under an overhanging and felt a "sting" about his upper mid back.  He does not know what the insect was but said it did "fly off". Since then though wound has been painful swollen.  Pain feels like stabbing.  States he is applied antibiotic ointment to the lesion every day but it has n ot gotten any better.  The pain is started to radiate to the right shoulder blade.  Endorses some vomiting but no fever.  Denies chest pain.  HPI     Home Medications Prior to Admission medications   Medication Sig Start Date End Date Taking? Authorizing Provider  mupirocin cream (BACTROBAN) 2 % Apply 1 Application topically 2 (two) times daily. 07/25/22  Yes Harriet Pho, PA-C  acetaminophen (TYLENOL) 325 MG tablet Take 2 tablets (650 mg total) by mouth every 6 (six) hours as needed for mild pain (or Fever >/= 101). 06/13/22   Samuella Cota, MD  amLODipine (NORVASC) 10 MG tablet Take 10 mg by mouth every morning. 05/11/22   [provider]  atorvastatin (LIPITOR) 20 MG tablet Take 20 mg by mouth at bedtime. 11/07/21   [provider]  cyclobenzaprine (FLEXERIL) 5 MG tablet Take 1 tablet (5 mg total) by mouth 3 (three) times daily as needed for muscle spasms. 06/13/22   Samuella Cota, MD  DULoxetine (CYMBALTA) 30 MG capsule Take 1 capsule (30 mg total) by mouth daily. 10/20/21   Ghimire, Henreitta Leber, MD  FARXIGA 10 MG TABS tablet Take 10 mg by mouth every morning. 02/07/22   [provider]  furosemide (LASIX) 20 MG tablet Take 20 mg by mouth every morning. 05/11/22   [provider]  glipiZIDE (GLUCOTROL) 10 MG tablet Take 10 mg  by mouth every morning. 05/11/22   [provider]  Insulin Pen Needle 32G X 4 MM MISC Use to inject insulin up to 4 times daily as needed. 10/20/21   Ghimire, Henreitta Leber, MD  losartan (COZAAR) 50 MG tablet Take 1 tablet (50 mg total) by mouth daily. 02/24/22   Charlynne Cousins, MD  metFORMIN (GLUCOPHAGE) 500 MG tablet Take 1 tablet (500 mg total) by mouth 2 (two) times daily with a meal. 06/07/22   Palumbo, April, MD  Oxycodone HCl 10 MG TABS Take 1 tablet (10 mg total) by mouth every 6 (six) hours as needed (pain). 06/13/22   Samuella Cota, MD  pantoprazole (PROTONIX) 40 MG tablet Take 1 tablet (40 mg total) by mouth daily. 10/20/21   Ghimire, Henreitta Leber, MD  pregabalin (LYRICA) 100 MG capsule Take 100 mg by mouth 2 (two) times daily. 02/03/22   [provider]      Allergies    Patient has no known allergies.    Review of Systems   Review of Systems  Skin:        Insect bite    Physical Exam Updated Vital Signs BP (!) 142/82   Pulse 74   Temp 97.7 F (36.5 C)   Resp 16   Ht 6\' 2"  (1.88 m)   Wt 95.3 kg   SpO2 98%   BMI  26.96 kg/m  Physical Exam Vitals and nursing note reviewed.  HENT:     Head: Normocephalic and atraumatic.     Mouth/Throat:     Mouth: Mucous membranes are moist.  Eyes:     General:        Right eye: No discharge.        Left eye: No discharge.     Conjunctiva/sclera: Conjunctivae normal.  Cardiovascular:     Rate and Rhythm: Normal rate and regular rhythm.     Pulses: Normal pulses.     Heart sounds: Normal heart sounds.  Pulmonary:     Effort: Pulmonary effort is normal.     Breath sounds: Normal breath sounds.  Abdominal:     General: Abdomen is flat.     Palpations: Abdomen is soft.  Musculoskeletal:       Back:  Skin:    General: Skin is warm and dry.  Neurological:     General: No focal deficit present.  Psychiatric:        Mood and Affect: Mood normal.     ED Results / Procedures / Treatments   Labs (all labs  ordered are listed, but only abnormal results are displayed) Labs Reviewed  COMPREHENSIVE METABOLIC PANEL - Abnormal; Notable for the following components:      Result Value   Glucose, Bld 229 (*)    BUN 25 (*)    Creatinine, Ser 1.25 (*)    All other components within normal limits  URINALYSIS, ROUTINE W REFLEX MICROSCOPIC - Abnormal; Notable for the following components:   Color, Urine STRAW (*)    Glucose, UA >=500 (*)    All other components within normal limits  CBC WITH DIFFERENTIAL/PLATELET    EKG None  Radiology No results found.  Procedures Procedures    Medications Ordered in ED Medications  diphenhydrAMINE-zinc acetate (BENADRYL) 2-0.1 % cream (has no administration in time range)  mupirocin cream (BACTROBAN) 2 % (has no administration in time range)  oxyCODONE-acetaminophen (PERCOCET/ROXICET) 5-325 MG per tablet 1 tablet (1 tablet Oral Given 07/25/22 1552)    ED Course/ Medical Decision Making/ A&P                           Medical Decision Making Risk Prescription drug management.  Patient presented for insect bite. Differential diagnosis for this complaint includes cellulitis, subcutaneous abscess, and bacteremia. Exam revealed punctate wound with moderately sized area of induration. Patient was erythematous, swollen and painful to touch indicating concern for cellulitis. Did consider abscess but no area of fluctuance and lesion was not oozing or bleeding. Treated cellulitis with mupirocin.  Considered bacteremia but unlikely given normal white blood cell count patient is afebrile and appears nontoxic.  Discussed return precautions.  Advised patient to follow-up with his PCP for wound reevaluation in 3 to 5 days.         Final Clinical Impression(s) / ED Diagnoses Final diagnoses:  Insect bite, unspecified site, initial encounter    Rx / DC Orders ED Discharge Orders          Ordered    mupirocin cream (BACTROBAN) 2 %  2 times daily         07/25/22 1616              Harriet Pho, PA-C 07/25/22 [place], [place], DO 07/25/22 2246

## 2022-07-25 NOTE — Progress Notes (Deleted)
07/25/2022 @ 6:59pm   CSW spoke with pt about what happened pt stated that 15 minutes after being wheeled into the lobby and being told to find his own transportation back to [place], Randall Hiss returned and took pt back to a room for him to await PTAR.  Pt was told that PTAR would be transporting pt back to [place].  CSW then apologized for pts experience and stated to pt if he needs any further assistance to contact me and CSW provided pt with phone #.  Pt is currently awaiting PTAR for transportation back to [place].  Loui Massenburg Tarpley-Carter, MSW, LCSW-A Pronouns:  She/Her/Hers [facility] HealthTransitions of Care Clinical Social Worker Direct Number:  [phone] [email or web address]

## 2022-07-26 MED ORDER — METFORMIN HCL 500 MG PO TABS
500.0000 mg | ORAL_TABLET | Freq: Once | ORAL | Status: AC
  Administered 2022-07-26: 500 mg via ORAL
  Filled 2022-07-26: qty 1

## 2022-07-26 MED ORDER — OXYCODONE HCL 5 MG PO TABS
10.0000 mg | ORAL_TABLET | Freq: Once | ORAL | Status: AC
  Administered 2022-07-26: 10 mg via ORAL
  Filled 2022-07-26: qty 2

## 2022-10-23 ENCOUNTER — Encounter: Payer: 59 | Admitting: Family

## 2022-11-06 ENCOUNTER — Other Ambulatory Visit: Payer: Self-pay | Admitting: *Deleted

## 2022-11-06 DIAGNOSIS — L03119 Cellulitis of unspecified part of limb: Secondary | ICD-10-CM

## 2022-11-08 NOTE — Progress Notes (Signed)
Office Note     CC: Right lower extremity paresthesias Requesting Provider:  Elmore Guise, MD  HPI: Brett White is a 62 y.o. (1960-12-17) male presenting at the request of .Arthur Holms, NP for PAD evaluation with a 59-monthhistory of right lower extremity paresthesias.  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  He also has a history of poorly controlled type 2 diabetes mellitus, stroke.  Had right lower extremity wounds and cellulitis which have all healed in the past.  On exam today, Brett White doing well, complaining of continued right lower extremity stocking glove neuropathy.  This is not appreciated in the other extremity as he has a below-knee amputation.  Symptoms have been progressive over the last several years but roughly 6 months ago he lost nearly all sensation. Most recently, roughly 2 weeks ago, Brett White that his toenails on his right foot began to fall off, some of the earliest to fall off have to regenerate.  He denies ulcerations, rest pain.  Minimally ambulatory, using a walker to help as he has the left-sided below-knee amputation.  When at home, he is in a wheelchair for    Past Medical History:  Diagnosis Date   GERD (gastroesophageal reflux disease)    HTN (hypertension)    Insulin dependent type 2 diabetes mellitus ([facility]    Neuropathy    Stroke ([facility]     Past Surgical History:  Procedure Laterality Date   AMPUTATION Left 02/04/2021   Procedure: LEFT GREAT TOE AMPUTATION;  Surgeon: DNewt Minion MD;  [place] [facility]  Service: Orthopedics;  Laterality: Left;   AMPUTATION Left 02/10/2021   Procedure: LEFT FOOT 1ST RAY AMPUTATION;  Surgeon: DNewt Minion MD;  [place] [facility]  Service: [place]opedics;  Laterality: Left;   AMPUTATION Left 03/22/2021   Procedure: LEFT BELOW KNEE AMPUTATION;  Surgeon: DNewt Minion MD;  [place] [facility]  Service: Orthopedics;  Laterality: Left;   APPENDECTOMY      BACK SURGERY     I & D EXTREMITY Left 03/03/2021   Procedure: LEFT FOOT DEBRIDEMENT;  Surgeon: DNewt Minion MD;  [place] [facility]  Service: Orthopedics;  Laterality: Left;   INCISION AND DRAINAGE ABSCESS N/A 10/06/2021   Procedure: INCISION AND DRAINAGE BACK ABSCESS;  Surgeon: CClovis Riley MD;  [place] [facility]  Service: General;  Laterality: N/A;   removal of back cyst     TONSILLECTOMY      Social History   Socioeconomic History   Marital status: Divorced    Spouse name: Not on file   Number of children: Not on file   Years of education: Not on file   Highest education level: Not on file  Occupational History   Not on file  Tobacco Use   Smoking status: Never   Smokeless tobacco: Never  Substance and Sexual Activity   Alcohol use: Not Currently   Drug use: Not Currently   Sexual activity: Not on file  Other Topics Concern   Not on file  Social History Narrative   Not on file   Social Determinants of Health   Financial Resource Strain: Not on file  Food Insecurity: No Food Insecurity (06/11/2022)   Hunger Vital Sign    Worried About Running Out of Food in the Last Year: Never true    Ran Out of Food in the Last Year: Never true  Transportation Needs: No Transportation Needs (06/11/2022)   PRAPARE - Transportation  Lack of Transportation (Medical): No    Lack of Transportation (Non-Medical): No  Physical Activity: Not on file  Stress: Not on file  Social Connections: Not on file  Intimate Partner Violence: Not At Risk (06/11/2022)   Humiliation, Afraid, Rape, and Kick questionnaire    Fear of Current or Ex-Partner: No    Emotionally Abused: No    Physically Abused: No    Sexually Abused: No   Family History  Problem Relation Age of Onset   Heart attack Neg Hx     Current Outpatient Medications  Medication Sig Dispense Refill   acetaminophen (TYLENOL) 325 MG tablet Take 2 tablets (650 mg total) by mouth every 6 (six) hours as needed for mild pain (or  Fever >/= 101).     amLODipine (NORVASC) 10 MG tablet Take 10 mg by mouth every morning.     atorvastatin (LIPITOR) 20 MG tablet Take 20 mg by mouth at bedtime.     cyclobenzaprine (FLEXERIL) 5 MG tablet Take 1 tablet (5 mg total) by mouth 3 (three) times daily as needed for muscle spasms. 9 tablet 0   DULoxetine (CYMBALTA) 30 MG capsule Take 1 capsule (30 mg total) by mouth daily. 30 capsule 3   FARXIGA 10 MG TABS tablet Take 10 mg by mouth every morning.     furosemide (LASIX) 20 MG tablet Take 20 mg by mouth every morning.     glipiZIDE (GLUCOTROL) 10 MG tablet Take 10 mg by mouth every morning.     Insulin Pen Needle 32G X 4 MM MISC Use to inject insulin up to 4 times daily as needed. 100 each 0   losartan (COZAAR) 50 MG tablet Take 1 tablet (50 mg total) by mouth daily.     metFORMIN (GLUCOPHAGE) 500 MG tablet Take 1 tablet (500 mg total) by mouth 2 (two) times daily with a meal. 60 tablet 0   mupirocin cream (BACTROBAN) 2 % Apply 1 Application topically 2 (two) times daily. 15 g 0   Oxycodone HCl 10 MG TABS Take 1 tablet (10 mg total) by mouth every 6 (six) hours as needed (pain). 9 tablet 0   pantoprazole (PROTONIX) 40 MG tablet Take 1 tablet (40 mg total) by mouth daily. 30 tablet 3   pregabalin (LYRICA) 100 MG capsule Take 100 mg by mouth 2 (two) times daily.     No current facility-administered medications for this visit.    No Known Allergies   REVIEW OF SYSTEMS:  [X]$  denotes positive finding, [ ]$  denotes negative finding Cardiac  Comments:  Chest pain or chest pressure:    Shortness of breath upon exertion:    Short of breath when lying flat:    Irregular heart rhythm:        Vascular    Pain in calf, thigh, or hip brought on by ambulation:    Pain in feet at night that wakes you up from your sleep:     Blood clot in your veins:    Leg swelling:         Pulmonary    Oxygen at home:    Productive cough:     Wheezing:         Neurologic    Sudden weakness in arms  or legs:     Sudden numbness in arms or legs:     Sudden onset of difficulty speaking or slurred speech:    Temporary loss of vision in one eye:     Problems with dizziness:  Gastrointestinal    Blood in stool:     Vomited blood:         Genitourinary    Burning when urinating:     Blood in urine:        Psychiatric    Major depression:         Hematologic    Bleeding problems:    Problems with blood clotting too easily:        Skin    Rashes or ulcers:        Constitutional    Fever or chills:      PHYSICAL EXAMINATION:  There were no vitals filed for this visit.  General:  WDWN in NAD; vital signs documented above Gait: Not observed HENT: WNL, normocephalic Pulmonary: normal non-labored breathing , without wheezing Cardiac: regular HR Abdomen: soft, NT, no masses Skin: without rashes Vascular Exam/Pulses:  Right Left  Radial 2+ (normal) 2+ (normal)  Ulnar    Femoral    Popliteal    DP  BKA  PT 1+ (weak)    Extremities: without ischemic changes, without Gangrene , without cellulitis; with open wounds;  Open wound bed present at the third toe nailbed, second and third toes nearly-healed Some edema on the right lower extremity superficial abrasions above the malleolus Musculoskeletal: no muscle wasting or atrophy  Neurologic: A&O X 3;  No focal weakness or paresthesias are detected Psychiatric:  The pt has Normal affect.   Non-Invasive Vascular Imaging:   ABI Findings:  +---------+------------------+-----+---------+--------+  Right   Rt Pressure (mmHg)IndexWaveform Comment   +---------+------------------+-----+---------+--------+  Brachial 157                                       +---------+------------------+-----+---------+--------+  ATA     185               1.18 triphasic          +---------+------------------+-----+---------+--------+  DP      184               1.17 triphasic           +---------+------------------+-----+---------+--------+  Marchelle Gearing               0.97                    +---------+------------------+-----+---------+--------+   +--------+------------------+-----+--------+-------+  Left   Lt Pressure (mmHg)IndexWaveformComment  +--------+------------------+-----+--------+-------+  [id number]                                    +--------+------------------+-----+--------+-------+   +-------+-----------+-----------+------------+------------+  ABI/TBIToday's ABIToday's TBIPrevious ABIPrevious TBI  +-------+-----------+-----------+------------+------------+  Right 1.18       0.97       [place]          1.35          +-------+-----------+-----------+------------+------------+  Left  BKA                   BKA                       +-------+-----------+-----------+------------+------------+     ASSESSMENT/PLAN: Daian Schlegel is a 62 y.o. male presenting with a 23-monthhistory of dense neuropathy that has been progressive over the last several years.  ABIs were reviewed demonstrating relatively normal, triphasic waveforms  with a sufficient toe pressure necessary for wound healing.  The ABI is likely falsely elevated due to his history of longstanding diabetes.  On physical exam, he had a palpable posterior tibial pulse.  I am unsure as to the etiology of why his toenails have fallen off, however he appears to have a fungal infection involving all of the nail beds.   Osher's right lower extremity 3rd toe wound and has only been present for 2 weeks, and appears to be healing nicely.  With a toe pressure of 153, I do not think angiography is necessary in an effort to define and improve distal perfusion.  In an effort to ensure the patient heals his foot appropriately, I plan to see him in 1 month's time.  Should his wounds worsen, we would discuss the role of right lower extremity diagnostic angiography with possible intervention  in an effort to keep him ambulatory for as long as possible.  Recommend the following which can slow the progression of atherosclerosis and reduce the risk of major adverse cardiac / limb events:  Aspirin 49m PO QD.  Atorvastatin 40-80mg PO QD (or other "high intensity" statin therapy). Complete cessation from all tobacco products. Blood glucose control with goal A1c < 7%. Blood pressure control with goal blood pressure < 140/90 mmHg. Lipid reduction therapy with goal LDL-C <100 mg/dL (<70 if symptomatic from PAD).    Brett John MD [facility] [phone] time of patient care including pre-visit research, consultation, and documentation greater than 30 minutes

## 2022-11-09 ENCOUNTER — Ambulatory Visit (HOSPITAL_COMMUNITY)
Admission: RE | Admit: 2022-11-09 | Discharge: 2022-11-09 | Disposition: A | Discharge status: Home / Self Care | Payer: Medicare Other | Source: Ambulatory Visit | Attending: Vascular Surgery | Admitting: Vascular Surgery

## 2022-11-09 ENCOUNTER — Encounter: Payer: Self-pay | Admitting: Vascular Surgery

## 2022-11-09 ENCOUNTER — Ambulatory Visit (INDEPENDENT_AMBULATORY_CARE_PROVIDER_SITE_OTHER): Payer: Medicare Other | Admitting: Vascular Surgery

## 2022-11-09 VITALS — BP 111/70 | HR 94 | Temp 97.9°F | Resp 18 | Ht 75.0 in | Wt 266.0 lb

## 2022-11-09 DIAGNOSIS — L03119 Cellulitis of unspecified part of limb: Secondary | ICD-10-CM | POA: Diagnosis not present

## 2022-11-09 DIAGNOSIS — I739 Peripheral vascular disease, unspecified: Secondary | ICD-10-CM

## 2022-11-09 LAB — VAS US ABI WITH/WO TBI: Right ABI: 1.18

## 2022-11-15 ENCOUNTER — Emergency Department (HOSPITAL_COMMUNITY): Payer: Medicare Other

## 2022-11-15 ENCOUNTER — Inpatient Hospital Stay (HOSPITAL_COMMUNITY)
Admission: EM | Admit: 2022-11-15 | Discharge: 2022-11-18 | DRG: 871 | Disposition: A | Discharge status: Skilled Nursing Facility | Payer: Medicare Other | Source: Skilled Nursing Facility | Attending: Internal Medicine | Admitting: Internal Medicine

## 2022-11-15 DIAGNOSIS — J690 Pneumonitis due to inhalation of food and vomit: Secondary | ICD-10-CM | POA: Diagnosis present

## 2022-11-15 DIAGNOSIS — J9601 Acute respiratory failure with hypoxia: Secondary | ICD-10-CM

## 2022-11-15 DIAGNOSIS — Z89512 Acquired absence of left leg below knee: Secondary | ICD-10-CM

## 2022-11-15 DIAGNOSIS — Z8673 Personal history of transient ischemic attack (TIA), and cerebral infarction without residual deficits: Secondary | ICD-10-CM

## 2022-11-15 DIAGNOSIS — E1142 Type 2 diabetes mellitus with diabetic polyneuropathy: Secondary | ICD-10-CM | POA: Diagnosis present

## 2022-11-15 DIAGNOSIS — Z8619 Personal history of other infectious and parasitic diseases: Secondary | ICD-10-CM

## 2022-11-15 DIAGNOSIS — E119 Type 2 diabetes mellitus without complications: Secondary | ICD-10-CM | POA: Diagnosis present

## 2022-11-15 DIAGNOSIS — K219 Gastro-esophageal reflux disease without esophagitis: Secondary | ICD-10-CM | POA: Diagnosis present

## 2022-11-15 DIAGNOSIS — Z9049 Acquired absence of other specified parts of digestive tract: Secondary | ICD-10-CM

## 2022-11-15 DIAGNOSIS — Z993 Dependence on wheelchair: Secondary | ICD-10-CM

## 2022-11-15 DIAGNOSIS — Z7984 Long term (current) use of oral hypoglycemic drugs: Secondary | ICD-10-CM

## 2022-11-15 DIAGNOSIS — G894 Chronic pain syndrome: Secondary | ICD-10-CM | POA: Diagnosis present

## 2022-11-15 DIAGNOSIS — E1165 Type 2 diabetes mellitus with hyperglycemia: Secondary | ICD-10-CM | POA: Diagnosis present

## 2022-11-15 DIAGNOSIS — E782 Mixed hyperlipidemia: Secondary | ICD-10-CM | POA: Diagnosis present

## 2022-11-15 DIAGNOSIS — Z1152 Encounter for screening for COVID-19: Secondary | ICD-10-CM

## 2022-11-15 DIAGNOSIS — Z79899 Other long term (current) drug therapy: Secondary | ICD-10-CM

## 2022-11-15 DIAGNOSIS — I152 Hypertension secondary to endocrine disorders: Secondary | ICD-10-CM | POA: Diagnosis present

## 2022-11-15 DIAGNOSIS — N189 Chronic kidney disease, unspecified: Secondary | ICD-10-CM | POA: Diagnosis present

## 2022-11-15 DIAGNOSIS — Z794 Long term (current) use of insulin: Secondary | ICD-10-CM | POA: Diagnosis present

## 2022-11-15 DIAGNOSIS — A419 Sepsis, unspecified organism: Principal | ICD-10-CM | POA: Insufficient documentation

## 2022-11-15 DIAGNOSIS — I10 Essential (primary) hypertension: Secondary | ICD-10-CM | POA: Diagnosis present

## 2022-11-15 DIAGNOSIS — N179 Acute kidney failure, unspecified: Secondary | ICD-10-CM | POA: Diagnosis present

## 2022-11-15 DIAGNOSIS — G459 Transient cerebral ischemic attack, unspecified: Secondary | ICD-10-CM

## 2022-11-15 DIAGNOSIS — E1169 Type 2 diabetes mellitus with other specified complication: Secondary | ICD-10-CM | POA: Diagnosis present

## 2022-11-15 DIAGNOSIS — M7121 Synovial cyst of popliteal space [Baker], right knee: Secondary | ICD-10-CM | POA: Diagnosis present

## 2022-11-15 DIAGNOSIS — R6520 Severe sepsis without septic shock: Secondary | ICD-10-CM | POA: Diagnosis present

## 2022-11-15 DIAGNOSIS — R531 Weakness: Secondary | ICD-10-CM | POA: Diagnosis not present

## 2022-11-15 DIAGNOSIS — J189 Pneumonia, unspecified organism: Secondary | ICD-10-CM | POA: Diagnosis present

## 2022-11-15 DIAGNOSIS — R600 Localized edema: Secondary | ICD-10-CM | POA: Diagnosis present

## 2022-11-15 LAB — CBC
HCT: 38.8 % — ABNORMAL LOW (ref 39.0–52.0)
Hemoglobin: 12.7 g/dL — ABNORMAL LOW (ref 13.0–17.0)
MCH: 27 pg (ref 26.0–34.0)
MCHC: 32.7 g/dL (ref 30.0–36.0)
MCV: 82.4 fL (ref 80.0–100.0)
Platelets: 279 10*3/uL (ref 150–400)
RBC: 4.71 MIL/uL (ref 4.22–5.81)
RDW: 13.3 % (ref 11.5–15.5)
WBC: 18.4 10*3/uL — ABNORMAL HIGH (ref 4.0–10.5)
nRBC: 0 % (ref 0.0–0.2)

## 2022-11-15 LAB — COMPREHENSIVE METABOLIC PANEL
ALT: 27 U/L (ref 0–44)
AST: 30 U/L (ref 15–41)
Albumin: 3.5 g/dL (ref 3.5–5.0)
Alkaline Phosphatase: 101 U/L (ref 38–126)
Anion gap: 13 (ref 5–15)
BUN: 26 mg/dL — ABNORMAL HIGH (ref 6–20)
CO2: 25 mmol/L (ref 22–32)
Calcium: 9 mg/dL (ref 8.9–10.3)
Chloride: 97 mmol/L — ABNORMAL LOW (ref 98–111)
Creatinine, Ser: 1.77 mg/dL — ABNORMAL HIGH (ref 0.61–1.24)
GFR, Estimated: 44 mL/min — ABNORMAL LOW (ref >60)
Glucose, Bld: 228 mg/dL — ABNORMAL HIGH (ref 70–99)
Potassium: 4.5 mmol/L (ref 3.5–5.1)
Sodium: 135 mmol/L (ref 135–145)
Total Bilirubin: 0.6 mg/dL (ref 0.3–1.2)
Total Protein: 7.4 g/dL (ref 6.5–8.1)

## 2022-11-15 LAB — LIPASE, BLOOD: Lipase: 23 U/L (ref 11–51)

## 2022-11-15 LAB — RESP PANEL BY RT-PCR (RSV, FLU A&B, COVID)  RVPGX2
Influenza A by PCR: NEGATIVE
Influenza B by PCR: NEGATIVE
Resp Syncytial Virus by PCR: NEGATIVE
SARS Coronavirus 2 by RT PCR: NEGATIVE

## 2022-11-15 LAB — TROPONIN I (HIGH SENSITIVITY)
Troponin I (High Sensitivity): 10 ng/L (ref <18)
Troponin I (High Sensitivity): 9 ng/L (ref <18)

## 2022-11-15 MED ORDER — SODIUM CHLORIDE 0.9 % IV SOLN
2.0000 g | Freq: Once | INTRAVENOUS | Status: AC
  Administered 2022-11-16: 2 g via INTRAVENOUS
  Filled 2022-11-15: qty 20

## 2022-11-15 MED ORDER — IOHEXOL 350 MG/ML SOLN
75.0000 mL | Freq: Once | INTRAVENOUS | Status: AC | PRN
  Administered 2022-11-15: 75 mL via INTRAVENOUS

## 2022-11-15 MED ORDER — SODIUM CHLORIDE 0.9 % IV BOLUS
500.0000 mL | Freq: Once | INTRAVENOUS | Status: AC
  Administered 2022-11-15: 500 mL via INTRAVENOUS

## 2022-11-15 MED ORDER — MORPHINE SULFATE (PF) 2 MG/ML IV SOLN
2.0000 mg | Freq: Once | INTRAVENOUS | Status: AC
  Administered 2022-11-15: 2 mg via INTRAVENOUS
  Filled 2022-11-15: qty 1

## 2022-11-15 MED ORDER — SODIUM CHLORIDE 0.9 % IV BOLUS
1000.0000 mL | Freq: Once | INTRAVENOUS | Status: AC
  Administered 2022-11-16: 1000 mL via INTRAVENOUS

## 2022-11-15 MED ORDER — SODIUM CHLORIDE 0.9 % IV SOLN
500.0000 mg | Freq: Once | INTRAVENOUS | Status: AC
  Administered 2022-11-16: 500 mg via INTRAVENOUS
  Filled 2022-11-15: qty 5

## 2022-11-15 NOTE — ED Provider Triage Note (Signed)
Emergency Medicine Provider Triage Evaluation Note  Brett White , a 62 y.o. male  was evaluated in triage.  Pt complains of nausea and vomiting. States that he has been feeling generally unwell over the past three days. States that he has also had lower extremity swelling to his right leg over the past 6 days. He was found to be hypoxic to 86% at facility and placed on O2. States he is feeling short of breath.   Review of Systems  Positive: See above Negative:   Physical Exam  BP 123/70   Pulse (!) 115   Temp 98.6 F (37 C) (Oral)   Resp 18   Ht 6' 2"$  (1.88 m)   Wt 97.5 kg   SpO2 95%   BMI 27.60 kg/m  Gen:   Awake, no distress   Resp:  Normal effort  MSK:   Moves extremities without difficulty  Other:  Tachycardia. On 2LNC sat 96%. RLE swelling.   Medical Decision Making  Medically screening exam initiated at 8:12 PM.  Appropriate orders placed.  Brett White was informed that the remainder of the evaluation will be completed by another provider, this initial triage assessment does not replace that evaluation, and the importance of remaining in the ED until their evaluation is complete.     Mickie Hillier, PA-C 11/15/22 2015

## 2022-11-15 NOTE — ED Provider Notes (Signed)
  Provider Note MRN:  [id number]  Arrival date & time: 11/16/22    ED Course and Medical Decision Making  Assumed care from Dr. Kathrynn Humble at shift change.  New oxygen requirement, question PE versus pneumonia.  Also with some acute confusion earlier today, per report some concern for TIA but no focal deficits at this time.  Will need admission.  12:15 AM update: PE study is negative for embolism, does show multifocal pneumonia.  Starting antibiotics, admitting to medicine.  .Critical Care  Performed by: Maudie Flakes, MD Authorized by: Maudie Flakes, MD   Critical care provider statement:    Critical care time (minutes):  35   Critical care was necessary to treat or prevent imminent or life-threatening deterioration of the following conditions:  Respiratory failure   Critical care was time spent personally by me on the following activities:  Development of treatment plan with patient or surrogate, discussions with consultants, evaluation of patient's response to treatment, examination of patient, ordering and review of laboratory studies, ordering and review of radiographic studies, ordering and performing treatments and interventions, pulse oximetry, re-evaluation of patient's condition and review of old charts   Final Clinical Impressions(s) / ED Diagnoses     ICD-10-CM   1. Acute hypoxic respiratory failure (HCC)  J96.01     2. Multifocal pneumonia  J18.9       ED Discharge Orders     None       Discharge Instructions   None     Barth Kirks. Sedonia Small, [place] [email or web address]    Maudie Flakes, MD 11/16/22 [phone]

## 2022-11-15 NOTE — ED Notes (Signed)
Pt back from X-ray.  

## 2022-11-15 NOTE — ED Notes (Signed)
Pt to Xray.

## 2022-11-15 NOTE — ED Triage Notes (Addendum)
Pt arrived by EMS from [facility] complaining of weakness and headache x3 days. Pt also having nausea and vomiting. Pt has been taking tylenol and Zofran with minimal relief   Pt states that he has also noticed some twitching in his hands and arms.   RA sat was 86% at facility, pt states that he feels out of it and dizzy but denies Shortness of breath   On 2L 95%

## 2022-11-15 NOTE — ED Provider Notes (Signed)
[facility] Provider Note   CSN: [id number] Arrival date & time: 11/15/22  1936     History  Chief Complaint  Patient presents with   Weakness   Headache    Brett White is a 62 y.o. male.  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  Patient recalls that he was sitting on the table, and next thing he knows he was in his room.  He was told by one of his roommates that he might have lost shortness.  Patient complains of generalized weakness, headaches for the last 3 days.  He also indicates that he has had numbness over his right leg for the last 3 months.  Patient's O2 sats were found to be in the 80s per paramedic.  Patient states that he was admitted for pneumonia earlier in the year, he is not on oxygen.  He has noticed shortness of breath over the last few days and has also appreciated swelling over his right leg.  Home Medications Prior to Admission medications   Medication Sig Start Date End Date Taking? Authorizing Provider  acetaminophen (TYLENOL) 325 MG tablet Take 2 tablets (650 mg total) by mouth every 6 (six) hours as needed for mild pain (or Fever >/= 101). 06/13/22  Yes Samuella Cota, MD  amLODipine (NORVASC) 10 MG tablet Take 10 mg by mouth every morning. 05/11/22  Yes [provider]  atorvastatin (LIPITOR) 20 MG tablet Take 20 mg by mouth at bedtime. 11/07/21  Yes [provider]  BISACODYL PO Take 10 mg by mouth daily as needed (CONSTIPATION).   Yes [provider]  cyclobenzaprine (FLEXERIL) 5 MG tablet Take 1 tablet (5 mg total) by mouth 3 (three) times daily as needed for muscle spasms. 06/13/22  Yes Samuella Cota, MD  DULoxetine (CYMBALTA) 30 MG capsule Take 1 capsule (30 mg total) by mouth daily. 10/20/21  Yes Ghimire,  Henreitta Leber, MD  FARXIGA 10 MG TABS tablet Take 10 mg by mouth every morning. 02/07/22  Yes [provider]  furosemide (LASIX) 20 MG tablet Take 20 mg by mouth every morning. 05/11/22  Yes [provider]  glipiZIDE (GLUCOTROL) 10 MG tablet Take 10 mg by mouth every morning. 05/11/22  Yes [provider]  hydrocortisone cream 1 % Apply 1 Application topically in the morning and at bedtime. TO EXTREMITIES FOR ONE WEEK STARTED 11/05/22   Yes [provider]  losartan (COZAAR) 50 MG tablet Take 1 tablet (50 mg total) by mouth daily. 02/24/22  Yes Charlynne Cousins, MD  magnesium hydroxide (MILK OF MAGNESIA) 400 MG/5ML suspension Take 30 mLs by mouth daily as needed for mild constipation.   Yes [provider]  metFORMIN (GLUCOPHAGE) 500 MG tablet Take 1 tablet (500 mg total) by mouth 2 (two) times daily with a meal. 06/07/22  Yes Palumbo, April, MD  pantoprazole (PROTONIX) 40 MG tablet Take 1 tablet (40 mg total) by mouth daily. 10/20/21  Yes Ghimire, Henreitta Leber, MD  Sodium Phosphates (RA SALINE ENEMA) 19-7 GM/118ML ENEM Place 1 Application rectally daily as needed (constipation).   Yes [provider]  amoxicillin-clavulanate (AUGMENTIN) 875-125 MG tablet Take 1 tablet by mouth every 12 (twelve) hours for 5 days. 11/18/22 11/23/22  Bonnielee Haff, MD  mupirocin cream (BACTROBAN) 2 % Apply 1 Application topically  2 (two) times daily. Patient not taking: Reported on 11/16/2022 07/25/22   Harriet Pho, PA-C  naloxone [facility]) nasal spray 4 mg/0.1 mL Place 1 spray into the nose See admin instructions. Every 2-3 minutesas needed until patient response/ emba arrived. 11/18/22   Bonnielee Haff, MD  Oxycodone HCl 10 MG TABS Take 1 tablet (10 mg total) by mouth every 6 (six) hours as needed (pain). 11/18/22   Bonnielee Haff, MD  polyethylene glycol (MIRALAX / GLYCOLAX) 17 g packet Take 17 g by mouth daily. 11/18/22   Bonnielee Haff, MD  pregabalin (LYRICA) 100 MG  capsule Take 1 capsule (100 mg total) by mouth 2 (two) times daily. 11/18/22   Bonnielee Haff, MD      Allergies    Patient has no known allergies.    Review of Systems   Review of Systems  All other systems reviewed and are negative.   Physical Exam Updated Vital Signs BP (!) 151/84 (BP Location: Left Arm)   Pulse 91   Temp 97.8 F (36.6 C) (Oral)   Resp 18   Ht 6' 2"$  (1.88 m)   Wt 97.5 kg   SpO2 97%   BMI 27.60 kg/m  Physical Exam Vitals and nursing note reviewed.  Constitutional:      Appearance: He is well-developed.  HENT:     Head: Atraumatic.  Cardiovascular:     Rate and Rhythm: Normal rate.  Pulmonary:     Effort: Pulmonary effort is normal.  Musculoskeletal:     Cervical back: Neck supple.  Skin:    General: Skin is warm.     Comments: L leg amputation  Neurological:     Mental Status: He is alert and oriented to person, place, and time.     Comments: Right distal leg has sensory loss     ED Results / Procedures / Treatments   Labs (all labs ordered are listed, but only abnormal results are displayed) Labs Reviewed  COMPREHENSIVE METABOLIC PANEL - Abnormal; Notable for the following components:      Result Value   Chloride 97 (*)    Glucose, Bld 228 (*)    BUN 26 (*)    Creatinine, Ser 1.77 (*)    GFR, Estimated 44 (*)    All other components within normal limits  CBC - Abnormal; Notable for the following components:   WBC 18.4 (*)    Hemoglobin 12.7 (*)    HCT 38.8 (*)    All other components within normal limits  URINALYSIS, ROUTINE W REFLEX MICROSCOPIC - Abnormal; Notable for the following components:   Glucose, UA >=500 (*)    All other components within normal limits  BASIC METABOLIC PANEL - Abnormal; Notable for the following components:   Glucose, Bld 201 (*)    BUN 22 (*)    Creatinine, Ser 1.45 (*)    Calcium 8.4 (*)    GFR, Estimated 56 (*)    All other components within normal limits  GLUCOSE, CAPILLARY - Abnormal; Notable  for the following components:   Glucose-Capillary 195 (*)    All other components within normal limits  GLUCOSE, CAPILLARY - Abnormal; Notable for the following components:   Glucose-Capillary 166 (*)    All other components within normal limits  GLUCOSE, CAPILLARY - Abnormal; Notable for the following components:   Glucose-Capillary 150 (*)    All other components within normal limits  GLUCOSE, CAPILLARY - Abnormal; Notable for the following components:   Glucose-Capillary 223 (*)  All other components within normal limits  CBC - Abnormal; Notable for the following components:   Hemoglobin 12.4 (*)    HCT 38.5 (*)    All other components within normal limits  BASIC METABOLIC PANEL - Abnormal; Notable for the following components:   Glucose, Bld 239 (*)    Creatinine, Ser 1.30 (*)    Calcium 8.8 (*)    All other components within normal limits  GLUCOSE, CAPILLARY - Abnormal; Notable for the following components:   Glucose-Capillary 280 (*)    All other components within normal limits  GLUCOSE, CAPILLARY - Abnormal; Notable for the following components:   Glucose-Capillary 219 (*)    All other components within normal limits  GLUCOSE, CAPILLARY - Abnormal; Notable for the following components:   Glucose-Capillary 266 (*)    All other components within normal limits  GLUCOSE, CAPILLARY - Abnormal; Notable for the following components:   Glucose-Capillary 206 (*)    All other components within normal limits  BASIC METABOLIC PANEL - Abnormal; Notable for the following components:   Glucose, Bld 264 (*)    Creatinine, Ser 1.28 (*)    Calcium 8.7 (*)    All other components within normal limits  GLUCOSE, CAPILLARY - Abnormal; Notable for the following components:   Glucose-Capillary 260 (*)    All other components within normal limits  GLUCOSE, CAPILLARY - Abnormal; Notable for the following components:   Glucose-Capillary 284 (*)    All other components within normal limits   GLUCOSE, CAPILLARY - Abnormal; Notable for the following components:   Glucose-Capillary 264 (*)    All other components within normal limits  RESP PANEL BY RT-PCR (RSV, FLU A&B, COVID)  RVPGX2  LIPASE, BLOOD  TROPONIN I (HIGH SENSITIVITY)  TROPONIN I (HIGH SENSITIVITY)    EKG EKG Interpretation  Date/Time:  Thursday November 15 2022 20:45:23 EST Ventricular Rate:  105 PR Interval:  168 QRS Duration: 90 QT Interval:  324 QTC Calculation: 428 R Axis:   -27 Text Interpretation: Sinus tachycardia Otherwise normal ECG When compared with ECG of 11-Jun-2022 07:07, PREVIOUS ECG IS PRESENT No acute changes No significant change since last tracing Confirmed by Varney Biles [phone]) on 11/15/2022 9:52:05 PM  Radiology CT EXTREMITY LOWER RIGHT W CONTRAST  Result Date: 11/18/2022 CLINICAL DATA:  Loss of sensation in the foot.  Pain and swelling. EXAM: CT OF THE LOWER RIGHT EXTREMITY WITH CONTRAST TECHNIQUE: Multidetector CT imaging of the lower right extremity was performed according to the standard protocol following intravenous contrast administration. RADIATION DOSE REDUCTION: This exam was performed according to the departmental dose-optimization program which includes automated exposure control, adjustment of the mA and/or kV according to patient size and/or use of iterative reconstruction technique. CONTRAST:  62m OMNIPAQUE IOHEXOL 350 MG/ML SOLN COMPARISON:  Previous CT scan 06/29/2021 FINDINGS: The knee and ankle joints are maintained. No acute bony findings. Mild degenerative changes. There is a small knee joint effusion. There is a small Baker's cyst. Cystic lesion also noted in the anterior and superficial aspect of the left medial gastroc muscle. It is possible this is part of a dissecting Baker's cyst but those are typically superficial to the fascia. This was not present on the prior CT scan. The tibia and fibula are intact. No fracture or bone lesion. No significant bony abnormalities  involving the foot. Calcaneal spurring changes are noted. Mild midfoot degenerative changes. Mild subcutaneous inflammation/edema but no subcutaneous abscess. No findings for myofasciitis or pyomyositis. Mild fatty atrophy of the  calf musculature and advanced fatty atrophy of the foot musculature. The major vascular structures appear patent. No evidence of deep venous thrombosis. IMPRESSION: 1. Mild subcutaneous inflammation/edema but no subcutaneous abscess. 2. No findings for myofasciitis or pyomyositis. 3. No acute bony findings. 4. Small knee joint effusion and small Baker's cyst. 5. Elongated tubular cystic lesion in the anterior and superficial aspect of the left medial gastroc muscle. This may be part of a dissecting Baker's cyst. No findings to suggest this is an abscess as there is no surrounding enhancement or inflammatory changes. Electronically Signed   By: Marijo Sanes M.D.   On: 11/18/2022 08:55    Procedures .Critical Care  Performed by: Varney Biles, MD Authorized by: Varney Biles, MD   Critical care provider statement:    Critical care time (minutes):  46   Critical care was necessary to treat or prevent imminent or life-threatening deterioration of the following conditions:  Respiratory failure   Critical care was time spent personally by me on the following activities:  Development of treatment plan with patient or surrogate, discussions with consultants, evaluation of patient's response to treatment, examination of patient, ordering and review of laboratory studies, ordering and review of radiographic studies, ordering and performing treatments and interventions, pulse oximetry, re-evaluation of patient's condition, review of old charts and obtaining history from patient or surrogate     Medications Ordered in ED Medications  lactated ringers infusion ( Intravenous New Bag/Given 11/16/22 1644)  0.45 % sodium chloride infusion (0 mLs Intravenous Stopped 11/17/22 2100)   sodium chloride 0.9 % bolus 500 mL (0 mLs Intravenous Stopped 11/16/22 0115)  iohexol (OMNIPAQUE) 350 MG/ML injection 75 mL (75 mLs Intravenous Contrast Given 11/15/22 2318)  morphine (PF) 2 MG/ML injection 2 mg (2 mg Intravenous Given 11/15/22 2356)  cefTRIAXone (ROCEPHIN) 2 g in sodium chloride 0.9 % 100 mL IVPB (0 g Intravenous Stopped 11/16/22 0043)  azithromycin (ZITHROMAX) 500 mg in sodium chloride 0.9 % 250 mL IVPB (0 mg Intravenous Stopped 11/16/22 0114)  sodium chloride 0.9 % bolus 1,000 mL (0 mLs Intravenous Stopped 11/16/22 0114)  iohexol (OMNIPAQUE) 350 MG/ML injection 75 mL (75 mLs Intravenous Contrast Given 11/17/22 1643)    ED Course/ Medical Decision Making/ A&P                             Medical Decision Making Amount and/or Complexity of Data Reviewed Labs: ordered. Radiology: ordered.  Risk Prescription drug management. Decision regarding hospitalization.   62 year old patient with multiple medical comorbidities including severe diabetes, left below the knee amputation comes in with chief complaint of questionable syncope, altered mental status.  Patient was found to be hypoxic and is on nasal cannula.  He complains of some shortness of breath and leg swelling.  Differential diagnosis for this patient includes PE, pneumonia, COVID-19/flu, severe electrolyte abnormality, ACS.  [facility].  The nursing staff over there informed me that patient was sent to the emergency room for altered mental status.  He was incoherent.  That nurse did not specifically know if patient had a syncopal episode.  Plan is for [place] to get basic labs, CT angio PE given the hypoxia, tachycardia, ultrasound DVT.  It is unclear if patient had a syncopal episode of there was a TIA type episode.  Plan will be to admit him for TIA vs Global amnesia.   Final Clinical Impression(s) / ED Diagnoses Final diagnoses:  Acute hypoxic respiratory  failure ([place])  Multifocal pneumonia   TIA (transient ischemic attack)    Rx / DC Orders ED Discharge Orders          Ordered    Oxycodone HCl 10 MG TABS  Every 6 hours PRN        11/18/22 0743    pregabalin (LYRICA) 100 MG capsule  2 times daily        11/18/22 0743    naloxone (NARCAN) nasal spray 4 mg/0.1 mL  See admin instructions        11/18/22 0743    amoxicillin-clavulanate (AUGMENTIN) 875-125 MG tablet  Every 12 hours        11/18/22 0912    Discharge instructions       Comments: Please review instructions on the discharge summary.  You were cared for by a hospitalist during your hospital stay. If you have any questions about your discharge medications or the care you received while you were in the hospital after you are discharged, you can call the unit and asked to speak with the hospitalist on call if the hospitalist that took care of you is not available. Once you are discharged, your primary care physician will handle any further medical issues. Please note that NO REFILLS for any discharge medications will be authorized once you are discharged, as it is imperative that you return to your primary care physician (or establish a relationship with a primary care physician if you do not have one) for your aftercare needs so that they can reassess your need for medications and monitor your lab values. If you do not have a primary care physician, you can call [phone] for a physician referral.   11/18/22 0912    Increase activity slowly        11/18/22 0912    Diet - low sodium heart healthy        11/18/22 0912    Call MD for:  temperature >100.4        11/18/22 0912    Call MD for:  persistant nausea and vomiting        11/18/22 0912    Call MD for:  severe uncontrolled pain        11/18/22 0912    Call MD for:  difficulty breathing, headache or visual disturbances        11/18/22 0912    Call MD for:  persistant dizziness or light-headedness        11/18/22 0912    Call MD for:  extreme fatigue         11/18/22 0912    polyethylene glycol (MIRALAX / GLYCOLAX) 17 g packet  Daily        11/18/22 0935              Varney Biles, MD 11/18/22 2211

## 2022-11-16 ENCOUNTER — Observation Stay (HOSPITAL_COMMUNITY): Payer: Medicare Other

## 2022-11-16 ENCOUNTER — Encounter (HOSPITAL_COMMUNITY): Payer: Self-pay | Admitting: Internal Medicine

## 2022-11-16 ENCOUNTER — Other Ambulatory Visit: Payer: Self-pay

## 2022-11-16 DIAGNOSIS — Z7984 Long term (current) use of oral hypoglycemic drugs: Secondary | ICD-10-CM | POA: Diagnosis not present

## 2022-11-16 DIAGNOSIS — E1169 Type 2 diabetes mellitus with other specified complication: Secondary | ICD-10-CM | POA: Diagnosis present

## 2022-11-16 DIAGNOSIS — J189 Pneumonia, unspecified organism: Secondary | ICD-10-CM | POA: Diagnosis present

## 2022-11-16 DIAGNOSIS — J690 Pneumonitis due to inhalation of food and vomit: Secondary | ICD-10-CM | POA: Diagnosis present

## 2022-11-16 DIAGNOSIS — Z9049 Acquired absence of other specified parts of digestive tract: Secondary | ICD-10-CM | POA: Diagnosis not present

## 2022-11-16 DIAGNOSIS — Z794 Long term (current) use of insulin: Secondary | ICD-10-CM | POA: Diagnosis not present

## 2022-11-16 DIAGNOSIS — Z993 Dependence on wheelchair: Secondary | ICD-10-CM | POA: Diagnosis not present

## 2022-11-16 DIAGNOSIS — E1142 Type 2 diabetes mellitus with diabetic polyneuropathy: Secondary | ICD-10-CM | POA: Diagnosis not present

## 2022-11-16 DIAGNOSIS — I10 Essential (primary) hypertension: Secondary | ICD-10-CM | POA: Diagnosis present

## 2022-11-16 DIAGNOSIS — M7121 Synovial cyst of popliteal space [Baker], right knee: Secondary | ICD-10-CM | POA: Diagnosis present

## 2022-11-16 DIAGNOSIS — E1165 Type 2 diabetes mellitus with hyperglycemia: Secondary | ICD-10-CM | POA: Diagnosis present

## 2022-11-16 DIAGNOSIS — A419 Sepsis, unspecified organism: Secondary | ICD-10-CM | POA: Insufficient documentation

## 2022-11-16 DIAGNOSIS — E782 Mixed hyperlipidemia: Secondary | ICD-10-CM | POA: Diagnosis present

## 2022-11-16 DIAGNOSIS — R600 Localized edema: Secondary | ICD-10-CM | POA: Diagnosis present

## 2022-11-16 DIAGNOSIS — Z8619 Personal history of other infectious and parasitic diseases: Secondary | ICD-10-CM | POA: Diagnosis not present

## 2022-11-16 DIAGNOSIS — N179 Acute kidney failure, unspecified: Secondary | ICD-10-CM | POA: Diagnosis present

## 2022-11-16 DIAGNOSIS — Z1152 Encounter for screening for COVID-19: Secondary | ICD-10-CM | POA: Diagnosis not present

## 2022-11-16 DIAGNOSIS — R6520 Severe sepsis without septic shock: Secondary | ICD-10-CM | POA: Diagnosis present

## 2022-11-16 DIAGNOSIS — Z79899 Other long term (current) drug therapy: Secondary | ICD-10-CM | POA: Diagnosis not present

## 2022-11-16 DIAGNOSIS — G894 Chronic pain syndrome: Secondary | ICD-10-CM | POA: Diagnosis present

## 2022-11-16 DIAGNOSIS — J9601 Acute respiratory failure with hypoxia: Secondary | ICD-10-CM | POA: Diagnosis present

## 2022-11-16 DIAGNOSIS — M7989 Other specified soft tissue disorders: Secondary | ICD-10-CM

## 2022-11-16 DIAGNOSIS — R531 Weakness: Secondary | ICD-10-CM | POA: Diagnosis present

## 2022-11-16 DIAGNOSIS — Z8673 Personal history of transient ischemic attack (TIA), and cerebral infarction without residual deficits: Secondary | ICD-10-CM | POA: Diagnosis not present

## 2022-11-16 DIAGNOSIS — K219 Gastro-esophageal reflux disease without esophagitis: Secondary | ICD-10-CM | POA: Diagnosis present

## 2022-11-16 DIAGNOSIS — Z89512 Acquired absence of left leg below knee: Secondary | ICD-10-CM | POA: Diagnosis not present

## 2022-11-16 LAB — GLUCOSE, CAPILLARY
Glucose-Capillary: 195 mg/dL — ABNORMAL HIGH (ref 70–99)
Glucose-Capillary: 166 mg/dL — ABNORMAL HIGH (ref 70–99)
Glucose-Capillary: 150 mg/dL — ABNORMAL HIGH (ref 70–99)
Glucose-Capillary: 223 mg/dL — ABNORMAL HIGH (ref 70–99)
Glucose-Capillary: 280 mg/dL — ABNORMAL HIGH (ref 70–99)

## 2022-11-16 LAB — URINALYSIS, ROUTINE W REFLEX MICROSCOPIC
Bacteria, UA: NONE SEEN
Bilirubin Urine: NEGATIVE
Glucose, UA: >=500 mg/dL — AB
Hgb urine dipstick: NEGATIVE
Ketones, ur: NEGATIVE mg/dL
Leukocytes,Ua: NEGATIVE
Nitrite: NEGATIVE
Protein, ur: NEGATIVE mg/dL
Specific Gravity, Urine: 1.027 (ref 1.005–1.030)
pH: 5 (ref 5.0–8.0)

## 2022-11-16 LAB — BASIC METABOLIC PANEL
Anion gap: 11 (ref 5–15)
BUN: 22 mg/dL — ABNORMAL HIGH (ref 6–20)
CO2: 23 mmol/L (ref 22–32)
Calcium: 8.4 mg/dL — ABNORMAL LOW (ref 8.9–10.3)
Chloride: 103 mmol/L (ref 98–111)
Creatinine, Ser: 1.45 mg/dL — ABNORMAL HIGH (ref 0.61–1.24)
GFR, Estimated: 56 mL/min — ABNORMAL LOW (ref >60)
Glucose, Bld: 201 mg/dL — ABNORMAL HIGH (ref 70–99)
Potassium: 4.7 mmol/L (ref 3.5–5.1)
Sodium: 137 mmol/L (ref 135–145)

## 2022-11-16 MED ORDER — INSULIN GLARGINE-YFGN 100 UNIT/ML ~~LOC~~ SOLN
10.0000 [IU] | Freq: Every day | SUBCUTANEOUS | Status: DC
  Administered 2022-11-16 – 2022-11-17 (×2): 10 [IU] via SUBCUTANEOUS
  Filled 2022-11-16 (×3): qty 0.1

## 2022-11-16 MED ORDER — INSULIN ASPART 100 UNIT/ML IJ SOLN
0.0000 [IU] | Freq: Every day | INTRAMUSCULAR | Status: DC
  Administered 2022-11-16 – 2022-11-17 (×2): 3 [IU] via SUBCUTANEOUS

## 2022-11-16 MED ORDER — ATORVASTATIN CALCIUM 10 MG PO TABS
20.0000 mg | ORAL_TABLET | Freq: Every day | ORAL | Status: DC
  Administered 2022-11-16 – 2022-11-17 (×2): 20 mg via ORAL
  Filled 2022-11-16 (×2): qty 2

## 2022-11-16 MED ORDER — SODIUM CHLORIDE 0.9 % IV SOLN
3.0000 g | Freq: Four times a day (QID) | INTRAVENOUS | Status: DC
  Administered 2022-11-16 – 2022-11-17 (×6): 3 g via INTRAVENOUS
  Filled 2022-11-16 (×6): qty 8

## 2022-11-16 MED ORDER — AMLODIPINE BESYLATE 10 MG PO TABS
10.0000 mg | ORAL_TABLET | Freq: Every morning | ORAL | Status: DC
  Administered 2022-11-17 – 2022-11-18 (×2): 10 mg via ORAL
  Filled 2022-11-16 (×2): qty 1

## 2022-11-16 MED ORDER — HEPARIN SODIUM (PORCINE) 5000 UNIT/ML IJ SOLN
5000.0000 [IU] | Freq: Three times a day (TID) | INTRAMUSCULAR | Status: DC
  Administered 2022-11-16 – 2022-11-18 (×7): 5000 [IU] via SUBCUTANEOUS
  Filled 2022-11-16 (×6): qty 1

## 2022-11-16 MED ORDER — ACETAMINOPHEN 325 MG PO TABS
650.0000 mg | ORAL_TABLET | Freq: Four times a day (QID) | ORAL | Status: DC | PRN
  Administered 2022-11-16 – 2022-11-17 (×2): 650 mg via ORAL
  Filled 2022-11-16 (×2): qty 2

## 2022-11-16 MED ORDER — ONDANSETRON HCL 4 MG PO TABS
4.0000 mg | ORAL_TABLET | Freq: Four times a day (QID) | ORAL | Status: DC | PRN
  Administered 2022-11-16: 4 mg via ORAL
  Filled 2022-11-16: qty 1

## 2022-11-16 MED ORDER — OXYCODONE HCL 5 MG PO TABS
5.0000 mg | ORAL_TABLET | Freq: Three times a day (TID) | ORAL | Status: DC | PRN
  Administered 2022-11-16 – 2022-11-17 (×3): 10 mg via ORAL
  Filled 2022-11-16 (×3): qty 2

## 2022-11-16 MED ORDER — LACTATED RINGERS IV SOLN: INTRAVENOUS | Status: AC

## 2022-11-16 MED ORDER — PANTOPRAZOLE SODIUM 40 MG PO TBEC
40.0000 mg | DELAYED_RELEASE_TABLET | Freq: Every day | ORAL | Status: DC
  Administered 2022-11-16 – 2022-11-18 (×3): 40 mg via ORAL
  Filled 2022-11-16 (×3): qty 1

## 2022-11-16 MED ORDER — OXYCODONE HCL 5 MG PO TABS
10.0000 mg | ORAL_TABLET | Freq: Two times a day (BID) | ORAL | Status: DC | PRN
  Administered 2022-11-16: 10 mg via ORAL
  Filled 2022-11-16: qty 2

## 2022-11-16 MED ORDER — ALBUTEROL SULFATE (2.5 MG/3ML) 0.083% IN NEBU: 2.5000 mg | INHALATION_SOLUTION | RESPIRATORY_TRACT | Status: DC | PRN

## 2022-11-16 MED ORDER — ACETAMINOPHEN 650 MG RE SUPP: 650.0000 mg | Freq: Four times a day (QID) | RECTAL | Status: DC | PRN

## 2022-11-16 MED ORDER — INSULIN ASPART 100 UNIT/ML IJ SOLN
0.0000 [IU] | Freq: Three times a day (TID) | INTRAMUSCULAR | Status: DC
  Administered 2022-11-16: 3 [IU] via SUBCUTANEOUS
  Administered 2022-11-16: 2 [IU] via SUBCUTANEOUS
  Administered 2022-11-16: 5 [IU] via SUBCUTANEOUS
  Administered 2022-11-17: 3 [IU] via SUBCUTANEOUS
  Administered 2022-11-17 (×2): 5 [IU] via SUBCUTANEOUS
  Administered 2022-11-18 (×2): 8 [IU] via SUBCUTANEOUS

## 2022-11-16 MED ORDER — ONDANSETRON HCL 4 MG/2ML IJ SOLN: 4.0000 mg | Freq: Four times a day (QID) | INTRAMUSCULAR | Status: DC | PRN

## 2022-11-16 NOTE — Plan of Care (Signed)

## 2022-11-16 NOTE — Progress Notes (Signed)
Pharmacy Antibiotic Note  Brett White is a 62 y.o. male admitted on 11/15/2022 with pneumonia.  Pharmacy has been consulted for unasyn dosing.  Plan: Unasyn 3G IV q6 hours  Height: 6' 2"$  (188 cm) Weight: 97.5 kg (215 lb) IBW/kg (Calculated) : 82.2  Temp (24hrs), Avg:98.5 F (36.9 C), Min:98.3 F (36.8 C), Max:98.6 F (37 C)  Recent Labs  Lab 11/15/22 2050  WBC 18.4*  CREATININE 1.77*    Estimated Creatinine Clearance: 52.2 mL/min (A) (by C-G formula based on SCr of 1.77 mg/dL (H)).    No Known Allergies   Thank you for allowing pharmacy to be a part of this patient's care.  Jodean Lima Kessie Croston 11/16/2022 1:02 AM

## 2022-11-16 NOTE — Assessment & Plan Note (Signed)
86% on RA. Currently on supplemental O2. Does not use O2 at SNF. Wean O2 as tolerated.

## 2022-11-16 NOTE — Assessment & Plan Note (Addendum)
Continue norvasc 5 mg. Hold lasix and ARB for now given his sepsis and need for IVF.

## 2022-11-16 NOTE — NC FL2 (Signed)
[place] LEVEL OF CARE FORM     IDENTIFICATION  Patient Name: Brett White Birthdate: 11/27/1960 Sex: male Admission Date (Current Location): 11/15/2022  [place] and [place] Number:  Kathleen Argue [id number] [place] and Address:  [facility] [place] [address], [place], [place] [address]      Provider Number: [id number]  Attending Physician Name and Address:  Bonnielee Haff, MD  Relative Name and Phone Number:  Joesph July [phone]    Current Level of Care: Hospital Recommended Level of Care: Nursing Facility Prior Approval Number:    Date Approved/Denied:   PASRR Number: [id number]  Discharge Plan: SNF    Current Diagnoses: Patient Active Problem List   Diagnosis Date Noted   Pneumonia 11/16/2022   Acute respiratory failure with hypoxia ([place]) 11/16/2022   Sepsis with acute organ dysfunction ([place]) 11/16/2022   Aspiration pneumonia ([place]) 11/16/2022   GERD without esophagitis 06/11/2022   Mixed diabetic hyperlipidemia associated with type 2 diabetes mellitus ([place]) 06/11/2022   Adjustment disorder with depressed mood 05/23/2022   Chronic hyponatremia 12/09/2021   Uncontrolled type 2 diabetes mellitus with hyperglycemia, without long-term current use of insulin ([place]) 12/09/2021   Tachycardia 12/09/2021   AKI (acute kidney injury) ([place]) 10/04/2021   S/P BKA (below knee amputation) unilateral, left ([place]) 04/05/2021   Uncontrolled type 2 diabetes mellitus with hyperglycemia ([place]) 02/03/2021   Essential hypertension 02/03/2021   Diabetic polyneuropathy associated with type 2 diabetes mellitus ([place]) 02/03/2021    Orientation RESPIRATION BLADDER Height & Weight     Self, Time, Situation, Place  O2 (Oxygen at 2L/min [place]) Continent Weight: 97.5 kg Height:  6' 2"$  (188 cm)  BEHAVIORAL SYMPTOMS/MOOD NEUROLOGICAL BOWEL NUTRITION STATUS      Continent Diet  AMBULATORY STATUS COMMUNICATION OF NEEDS Skin   Limited Assist Verbally  Other (Comment) (History of Left BKA)                       Personal Care Assistance Level of Assistance  Bathing, Feeding, Dressing Bathing Assistance: Limited assistance Feeding assistance: Independent Dressing Assistance: Limited assistance     Functional Limitations Info  Sight, Hearing, Speech Sight Info: Adequate Hearing Info: Adequate Speech Info: Adequate    SPECIAL CARE FACTORS FREQUENCY                       Contractures Contractures Info: Not present    Additional Factors Info  Code Status, Allergies, Insulin Sliding Scale Code Status Info: Full code Allergies Info: NKDA   Insulin Sliding Scale Info: Moderate Sliding scale insulin 3 x per day with meals       Current Medications (11/16/2022):  This is the current hospital active medication list Current Facility-Administered Medications  Medication Dose Route Frequency Provider Last Rate Last Admin   acetaminophen (TYLENOL) tablet 650 mg  650 mg Oral Q6H PRN Kristopher Oppenheim, DO   650 mg at 11/16/22 [id number]   Or   acetaminophen (TYLENOL) suppository 650 mg  650 mg Rectal Q6H PRN Kristopher Oppenheim, DO       albuterol (PROVENTIL) (2.5 MG/3ML) 0.083% nebulizer solution 2.5 mg  2.5 mg Nebulization Q2H PRN Kristopher Oppenheim, DO       amLODipine (NORVASC) tablet 10 mg  10 mg Oral q morning Kristopher Oppenheim, DO       Ampicillin-Sulbactam (UNASYN) 3 g in sodium chloride 0.9 % 100 mL IVPB  3 g Intravenous Q6H Rudisill, Tony L, RPH 200 mL/hr  at 11/16/22 0840 3 g at 11/16/22 0840   atorvastatin (LIPITOR) tablet 20 mg  20 mg Oral QHS Kristopher Oppenheim, DO       heparin injection 5,000 Units  5,000 Units Subcutaneous Q8H Kristopher Oppenheim, DO   5,000 Units at 11/16/22 1505   insulin aspart (novoLOG) injection 0-15 Units  0-15 Units Subcutaneous TID WC Kristopher Oppenheim, DO   2 Units at 11/16/22 1227   insulin aspart (novoLOG) injection 0-5 Units  0-5 Units Subcutaneous QHS Kristopher Oppenheim, DO       insulin glargine-yfgn [facility]) injection 10 Units  10 Units  Subcutaneous QHS Kristopher Oppenheim, DO       lactated ringers infusion   Intravenous Continuous Bonnielee Haff, MD 100 mL/hr at 11/16/22 0422 New Bag at 11/16/22 0422   ondansetron (ZOFRAN) tablet 4 mg  4 mg Oral Q6H PRN Kristopher Oppenheim, DO   4 mg at 11/16/22 [id number]   Or   ondansetron (ZOFRAN) injection 4 mg  4 mg Intravenous Q6H PRN Kristopher Oppenheim, DO       oxyCODONE (Oxy IR/ROXICODONE) immediate release tablet 5-10 mg  5-10 mg Oral Q8H PRN Bonnielee Haff, MD   10 mg at 11/16/22 1240   pantoprazole (PROTONIX) EC tablet 40 mg  40 mg Oral Daily Kristopher Oppenheim, DO   40 mg at 11/16/22 0909     Discharge Medications: Please see discharge summary for a list of discharge medications.  Relevant Imaging Results:  Relevant Lab Results:   Additional Information SS# [id number]  Curlene Labrum, RN

## 2022-11-16 NOTE — Subjective & Objective (Signed)
CC: hypoxia HPI: 62 year old Caucasian male history of left BKA, uncontrolled type 2 diabetes, hypertension, diabetic polyneuropathy, currently living in a nursing home [facility]) presents to ER via EMS.  Reportedly patient was confused and hypoxic.  Room air saturations were reportedly 86%.  Patient states that he had nausea and vomiting for last 3 days.  He states that he "passed out" yesterday at lunch.  Patient denies any fever or chills.  He has not had any diarrhea.  On arrival to the ER, temp 98.6 heart rate 115 blood pressure 123/70 satting 95% on 2 L.  Labs showed COVID-negative, influenza negative, RSV negative  Lipase normal at 23  BUN of 26, creatinine 1.77  CTPA was negative for PE but did demonstrate right lung infiltrates and left basilar infiltrates.  [facility] hospitalist contacted for admission.

## 2022-11-16 NOTE — Assessment & Plan Note (Signed)
Hold metformin. Continue glipizide. Add SSI.

## 2022-11-16 NOTE — Assessment & Plan Note (Addendum)
Stable.  Patient states that he has had a left lower leg prosthesis for about 6 months.  He only wears it about 2 to 3 hours a day at the nursing home.  He is mostly wheelchair-bound.  He plans on living at the nursing home long-term.  Patient recently seen by vascular surgery last week by Dr. Unk Lightning.  Reportedly he has good circulation in his right foot.  With toe pressures of 153 mmHg.  Adequate for healing.

## 2022-11-16 NOTE — Progress Notes (Signed)
[facility] PROGRESS NOTE   Brett White [id number] DOB: 12-01-1960 DOA: 11/15/2022  PCP: [facility]  Brief History/Interval Summary: 62 year old Caucasian male history of left BKA, uncontrolled type 2 diabetes, hypertension, diabetic polyneuropathy, currently living in a nursing home ([facility]) presents to ER via EMS.  Reportedly patient was confused and hypoxic.  Room air saturations were reportedly 86%.  Patient states that he had nausea and vomiting for last 3 days.  He states that he "passed out" as well.  Patient found to have pneumonia on imaging studies.  He was hospitalized for further management.     Consultants: None  Procedures: None    Subjective/Interval History: Patient complains of cough shortness of breath even at rest.  Complains of numbness in his right lower extremity which has been ongoing for a long time.  Denies any abdominal pain.  No nausea or vomiting in the last 24 hours.   Assessment/Plan:  Aspiration pneumonia/acute respiratory failure with hypoxia/sepsis present on admission Patient recently had nausea and vomiting which could have resulted in aspiration.  He was noted to be hypoxic at presentation.  WBC was noted to be elevated.  Started on Unasyn.  Continue with oxygen and wean down to maintain saturations greater than 90%. He was septic at the time of admission with tachycardia elevated WBC and pneumonia on imaging studies.  Sepsis physiology has improved. Patient's influenza, RSV and COVID-19 PCR's were negative.  UA unremarkable.  Acute kidney injury Presented with creatinine of 1.7.  Baseline creatinine seems to be around 1.2.  Has been hydrated.  Renal function has improved.  Continue IV fluids for another 12 hours.  Monitor urine output.  Avoid nephrotoxic agents. Holding ARB and diuretics.  Diabetes mellitus type 2 with diabetic neuropathy Monitor CBGs.  Continue with SSI and glargine.  No recent HbA1c.  HbA1c was  10.8 in September.  Hyperlipidemia Continue with Lipitor.  Essential hypertension Continue with amlodipine.  Holding ARB and diuretics.  Status post left BKA Stable.  Has edema in the right lower extremities.  Venous Doppler studies pending.  Recent ABIs were unremarkable in the right lower extremity.  Chronic pain syndrome Patient mentions that he takes oxycodone every 4-6 hours at his nursing facility.  Prescriber database was reviewed.  The last time he was prescribed oxycodone was in August 2023, 120 tablets were prescribed. His home medication list has not been reconciled yet.  Continue for now.  Will need to verify if he is indeed still on oxycodone or not.  DVT Prophylaxis: Subcutaneous heparin Code Status: Full code Family Communication: Discussed with patient Disposition Plan: Hopefully back to skilled nursing facility when improved  Status is: Observation The patient will require care spanning > 2 midnights and should be moved to inpatient because: Acute kidney injury, acute respiratory failure with hypoxia      Medications: Scheduled:  amLODipine  10 mg Oral q morning   atorvastatin  20 mg Oral QHS   heparin  5,000 Units Subcutaneous Q8H   insulin aspart  0-15 Units Subcutaneous TID WC   insulin aspart  0-5 Units Subcutaneous QHS   insulin glargine-yfgn  10 Units Subcutaneous QHS   pantoprazole  40 mg Oral Daily   Continuous:  ampicillin-sulbactam (UNASYN) IV 3 g (11/16/22 0840)   lactated ringers 100 mL/hr at 11/16/22 0422   [id number] **OR** acetaminophen, albuterol, ondansetron **OR** ondansetron (ZOFRAN) IV, oxyCODONE  Antibiotics: Anti-infectives (From admission, onward)    Start     Dose/Rate Route Frequency  Ordered Stop   11/16/22 0200  Ampicillin-Sulbactam (UNASYN) 3 g in sodium chloride 0.9 % 100 mL IVPB        3 g 200 mL/hr over 30 Minutes Intravenous Every 6 hours 11/16/22 0101     11/16/22 0000  cefTRIAXone (ROCEPHIN) 2 g in sodium  chloride 0.9 % 100 mL IVPB        2 g 200 mL/hr over 30 Minutes Intravenous  Once 11/15/22 2346 11/16/22 0043   11/16/22 0000  azithromycin (ZITHROMAX) 500 mg in sodium chloride 0.9 % 250 mL IVPB        500 mg 250 mL/hr over 60 Minutes Intravenous  Once 11/15/22 2346 11/16/22 0114       Objective:  Vital Signs  Vitals:   11/15/22 2145 11/16/22 0013 11/16/22 0333 11/16/22 0700  BP: 114/76  (!) 125/109 (!) 107/49  Pulse: (!) 103  (!) 106 87  Resp: 14  17 16  $ Temp:  98.3 F (36.8 C) 98.2 F (36.8 C) 98 F (36.7 C)  TempSrc:  Oral Oral Oral  SpO2: 92%  98% 97%  Weight:      Height:        Intake/Output Summary (Last 24 hours) at 11/16/2022 0918 Last data filed at 11/16/2022 0600 Gross per 24 hour  Intake 3754.18 ml  Output --  Net 3754.18 ml   Filed Weights   11/15/22 1944  Weight: 97.5 kg    General appearance: Awake alert.  In no distress Resp: Normal effort at rest.  Crackles on the right.  Minimal wheezing.  No rhonchi. Cardio: S1-S2 is normal regular.  No S3-S4.  No rubs murmurs or bruit GI: Abdomen is soft.  Nontender nondistended.  Bowel sounds are present normal.  No masses organomegaly Extremities: Status post left BKA.  Edema in the right lower extremity.  No erythema. Neurologic: Alert and oriented x3.  No focal neurological deficits.    Lab Results:  Data Reviewed: I have personally reviewed following labs and reports of the imaging studies  CBC: Recent Labs  Lab 11/15/22 2050  WBC 18.4*  HGB 12.7*  HCT 38.8*  MCV 82.4  PLT [id number]    Basic Metabolic Panel: Recent Labs  Lab 11/15/22 2050 11/16/22 0825  NA 135 137  K 4.5 4.7  CL 97* 103  CO2 25 23  GLUCOSE 228* 201*  BUN 26* 22*  CREATININE 1.77* 1.45*  CALCIUM 9.0 8.4*    GFR: Estimated Creatinine Clearance: 63.8 mL/min (A) (by C-G formula based on SCr of 1.45 mg/dL (H)).  Liver Function Tests: Recent Labs  Lab 11/15/22 2050  AST 30  ALT 27  ALKPHOS 101  BILITOT 0.6  PROT 7.4   ALBUMIN 3.5    Recent Labs  Lab 11/15/22 2050  LIPASE 23     CBG: Recent Labs  Lab 11/16/22 0337 11/16/22 0856  GLUCAP 195* 166*     Recent Results (from the past 240 hour(s))  Resp panel by RT-PCR (RSV, Flu A&B, Covid) Anterior Nasal Swab     Status: None   Collection Time: 11/15/22  9:00 PM   Specimen: Anterior Nasal Swab  Result Value Ref Range Status   SARS Coronavirus 2 by RT PCR NEGATIVE NEGATIVE Final   Influenza A by PCR NEGATIVE NEGATIVE Final   Influenza B by PCR NEGATIVE NEGATIVE Final    Comment: (NOTE) The Xpert Xpress SARS-CoV-2/FLU/RSV plus assay is intended as an aid in the diagnosis of influenza from Nasopharyngeal swab specimens and should  not be used as a sole basis for treatment. Nasal washings and aspirates are unacceptable for Xpert Xpress SARS-CoV-2/FLU/RSV testing.  Fact Sheet for Patients: EntrepreneurPulse.com.au  Fact Sheet for Healthcare Providers: IncredibleEmployment.be  This test is not yet approved or cleared by the [place] FDA and has been authorized for detection and/or diagnosis of SARS-CoV-2 by FDA under an Emergency Use Authorization (EUA). This EUA will remain in effect (meaning this test can be used) for the duration of the COVID-19 declaration under Section 564(b)(1) of the Act, 21 U.S.C. section 360bbb-3(b)(1), unless the authorization is terminated or revoked.     Resp Syncytial Virus by PCR NEGATIVE NEGATIVE Final    Comment: (NOTE) Fact Sheet for Patients: EntrepreneurPulse.com.au  Fact Sheet for Healthcare Providers: IncredibleEmployment.be  This test is not yet approved or cleared by the [place] FDA and has been authorized for detection and/or diagnosis of SARS-CoV-2 by FDA under an Emergency Use Authorization (EUA). This EUA will remain in effect (meaning this test can be used) for the duration of the COVID-19 declaration under  Section 564(b)(1) of the Act, 21 U.S.C. section 360bbb-3(b)(1), unless the authorization is terminated or revoked.  Performed at [facility], [place] [address] [place], [place] [address]       Radiology Studies: CT Angio Chest PE W/Cm &/Or Wo Cm  Result Date: 11/15/2022 CLINICAL DATA:  Pulmonary embolus suspected with high probability. Weakness and headache for 3 days. Nausea and vomiting. EXAM: CT ANGIOGRAPHY CHEST WITH CONTRAST TECHNIQUE: Multidetector CT imaging of the chest was performed using the standard protocol during bolus administration of intravenous contrast. Multiplanar CT image reconstructions and MIPs were obtained to evaluate the vascular anatomy. RADIATION DOSE REDUCTION: This exam was performed according to the departmental dose-optimization program which includes automated exposure control, adjustment of the mA and/or kV according to patient size and/or use of iterative reconstruction technique. CONTRAST:  57m OMNIPAQUE IOHEXOL 350 MG/ML SOLN COMPARISON:  12/09/2021 FINDINGS: Cardiovascular: There is good opacification of the central and segmental pulmonary arteries resulting in technically adequate study. No focal filling defects are identified. No evidence of significant pulmonary embolus. Normal heart size. No pericardial effusions. Normal caliber thoracic aorta. No aortic dissection. Scattered aortic calcification. Mediastinum/Nodes: Thyroid gland is unremarkable. Esophagus is decompressed. No significant lymphadenopathy. Lungs/Pleura: Diffuse patchy nodular infiltrative changes throughout the right lung and in the left base. Changes are most likely to represent multifocal pneumonia or aspiration. No pleural effusions. No pneumothorax. Upper Abdomen: No acute abnormalities. Musculoskeletal: Degenerative changes in the spine. Review of the MIP images confirms the above findings. IMPRESSION: 1. No evidence of significant pulmonary embolus. 2. Patchy airspace disease throughout  both lungs, most prominent on the right, likely representing multifocal pneumonia. Aspiration would be another consideration in the setting of nausea and vomiting. Electronically Signed   By: WLucienne CapersM.D.   On: 11/15/2022 23:25   DG Chest 2 View  Result Date: 11/15/2022 CLINICAL DATA:  Hypoxia EXAM: CHEST - 2 VIEW COMPARISON:  Chest x-ray 02/16/2022 FINDINGS: There is some faint patchy opacities in the right lower lung. There is no pleural effusion or pneumothorax. The cardiomediastinal silhouette is within normal limits. Osseous structures are within normal limits. IMPRESSION: Faint patchy opacities in the right lower lung may represent atelectasis or infection. Electronically Signed   By: ARonney AstersM.D.   On: 11/15/2022 21:46       LOS: 0 days   [facility] Pager on [email or web address]  11/16/2022, 9:18 AM

## 2022-11-16 NOTE — Assessment & Plan Note (Signed)
Continue protonix 40mg daily.  

## 2022-11-16 NOTE — Assessment & Plan Note (Signed)
Continue with IV fluids.  Hold ARB and diuretics.

## 2022-11-16 NOTE — Assessment & Plan Note (Addendum)
Observation med/surg bed. Pt with N/V the last 3 days. Possible aspiration. Start IV Unasyn. Clear liquid diet. Consider swallow evaluation if not improving on standard abx therapy.

## 2022-11-16 NOTE — H&P (Addendum)
History and Physical    Brett White [id number] DOB: [birth year] DOA: 11/15/2022  DOS: the patient was seen and examined on 11/15/2022  PCP: [facility]   Patient coming from: [facility]  I have personally briefly reviewed patient's old medical records in [place]  CC: hypoxia HPI: 62 year old Caucasian male history of left BKA, uncontrolled type 2 diabetes, hypertension, diabetic polyneuropathy, currently living in a nursing home ([facility]) presents to ER via EMS.  Reportedly patient was confused and hypoxic.  Room air saturations were reportedly 86%.  Patient states that he had nausea and vomiting for last 3 days.  He states that he "passed out" yesterday at lunch.  Patient denies any fever or chills.  He has not had any diarrhea.  On arrival to the ER, temp 98.6 heart rate 115 blood pressure 123/70 satting 95% on 2 L.  Labs showed COVID-negative, influenza negative, RSV negative  Lipase normal at 23  BUN of 26, creatinine 1.77  CTPA was negative for PE but did demonstrate right lung infiltrates and left basilar infiltrates.  [facility] hospital[facility] contacted for admission.   ED Course: CTPA negative for PE. Shows RLL pneumonia, left basilar pneumonia  Review of Systems:  Review of Systems  Constitutional: Negative.   HENT: Negative.    Eyes: Negative.   Respiratory: Negative.    Cardiovascular: Negative.   Gastrointestinal:  Positive for nausea and vomiting.  Genitourinary: Negative.   Musculoskeletal:        Leg pain  Skin: Negative.   Neurological: Negative.   Endo/Heme/Allergies: Negative.   Psychiatric/Behavioral: Negative.    All other systems reviewed and are negative.   Past Medical History:  Diagnosis Date   CAP (community acquired pneumonia) 12/09/2021   GERD (gastroesophageal reflux disease)    HTN (hypertension)    Insulin dependent type 2 diabetes mellitus ([place])    Neuropathy    Osteomyelitis of great toe of left foot  ([place]) 02/03/2021   Stroke [facility])     Past Surgical History:  Procedure Laterality Date   AMPUTATION Left 02/04/2021   Procedure: LEFT GREAT TOE AMPUTATION;  Surgeon: Newt Minion, MD;  Location: [place];  Service: Orthopedics;  Laterality: Left;   AMPUTATION Left 02/10/2021   Procedure: LEFT FOOT 1ST RAY AMPUTATION;  Surgeon: Newt Minion, MD;  Location: [place];  Service: Orthopedics;  Laterality: Left;   AMPUTATION Left 03/22/2021   Procedure: LEFT BELOW KNEE AMPUTATION;  Surgeon: Newt Minion, MD;  Location: [place];  Service: Orthopedics;  Laterality: Left;   APPENDECTOMY     BACK SURGERY     I & D EXTREMITY Left 03/03/2021   Procedure: LEFT FOOT DEBRIDEMENT;  Surgeon: Newt Minion, MD;  Location: [place];  Service: Orthopedics;  Laterality: Left;   INCISION AND DRAINAGE ABSCESS N/A 10/06/2021   Procedure: INCISION AND DRAINAGE BACK ABSCESS;  Surgeon: Clovis Riley, MD;  Location: [place];  Service: General;  Laterality: N/A;   removal of back cyst     TONSILLECTOMY       reports that he has never smoked. He has never used smokeless tobacco. He reports that he does not currently use alcohol. He reports that he does not currently use drugs.  No Known Allergies  Family History  Problem Relation Age of Onset   Heart attack Neg Hx     Prior to Admission medications   Medication Sig Start Date End Date Taking? Authorizing Provider  acetaminophen (TYLENOL) 325 MG tablet  Take 2 tablets (650 mg total) by mouth every 6 (six) hours as needed for mild pain (or Fever >/= 101). 06/13/22   Samuella Cota, MD  amLODipine (NORVASC) 10 MG tablet Take 10 mg by mouth every morning. 05/11/22   [provider]  atorvastatin (LIPITOR) 20 MG tablet Take 20 mg by mouth at bedtime. 11/07/21   [provider]  cyclobenzaprine (FLEXERIL) 5 MG tablet Take 1 tablet (5 mg total) by mouth 3 (three) times daily as needed for muscle spasms. 06/13/22   Samuella Cota, MD  DULoxetine  (CYMBALTA) 30 MG capsule Take 1 capsule (30 mg total) by mouth daily. 10/20/21   Ghimire, Henreitta Leber, MD  FARXIGA 10 MG TABS tablet Take 10 mg by mouth every morning. 02/07/22   [provider]  furosemide (LASIX) 20 MG tablet Take 20 mg by mouth every morning. 05/11/22   [provider]  glipiZIDE (GLUCOTROL) 10 MG tablet Take 10 mg by mouth every morning. 05/11/22   [provider]  Insulin Pen Needle 32G X 4 MM MISC Use to inject insulin up to 4 times daily as needed. 10/20/21   Ghimire, Henreitta Leber, MD  losartan (COZAAR) 50 MG tablet Take 1 tablet (50 mg total) by mouth daily. 02/24/22   Charlynne Cousins, MD  metFORMIN (GLUCOPHAGE) 500 MG tablet Take 1 tablet (500 mg total) by mouth 2 (two) times daily with a meal. 06/07/22   Palumbo, April, MD  mupirocin cream (BACTROBAN) 2 % Apply 1 Application topically 2 (two) times daily. 07/25/22   Harriet Pho, PA-C  Oxycodone HCl 10 MG TABS Take 1 tablet (10 mg total) by mouth every 6 (six) hours as needed (pain). 06/13/22   Samuella Cota, MD  pantoprazole (PROTONIX) 40 MG tablet Take 1 tablet (40 mg total) by mouth daily. 10/20/21   Ghimire, Henreitta Leber, MD  pregabalin (LYRICA) 100 MG capsule Take 100 mg by mouth 2 (two) times daily. 02/03/22   [provider]    Physical Exam: Vitals:   11/15/22 2100 11/15/22 2115 11/15/22 2145 11/16/22 0013  BP: 116/72 99/80 114/76   Pulse: (!) 102 100 (!) 103   Resp: 15 15 14   $ Temp:    98.3 F (36.8 C)  TempSrc:    Oral  SpO2: 93% 93% 92%   Weight:      Height:        Physical Exam Vitals and nursing note reviewed.  Constitutional:      General: He is not in acute distress.    Appearance: He is obese. He is not toxic-appearing or diaphoretic.     Comments: Chronically ill appearing  HENT:     Head: Normocephalic and atraumatic.     Nose: Nose normal.  Cardiovascular:     Rate and Rhythm: Normal rate and regular rhythm.  Pulmonary:     Effort: Pulmonary effort is  normal.     Breath sounds: No rales.  Abdominal:     General: Bowel sounds are normal. There is no distension.     Tenderness: There is no abdominal tenderness.     Hernia: No hernia is present.  Musculoskeletal:     Comments: Left BKA  Abrasions noted of right foot/right LE  Skin:    General: Skin is warm and dry.     Capillary Refill: Capillary refill takes less than 2 seconds.  Neurological:     Mental Status: He is alert and oriented to person, place, and  time.      Labs on Admission: I have personally reviewed following labs and imaging studies  CBC: Recent Labs  Lab 11/15/22 2050  WBC 18.4*  HGB 12.7*  HCT 38.8*  MCV 82.4  PLT 123XX123   Basic Metabolic Panel: Recent Labs  Lab 11/15/22 2050  NA 135  K 4.5  CL 97*  CO2 25  GLUCOSE 228*  BUN 26*  CREATININE 1.77*  CALCIUM 9.0   GFR: Estimated Creatinine Clearance: 52.2 mL/min (A) (by C-G formula based on SCr of 1.77 mg/dL (H)). Liver Function Tests: Recent Labs  Lab 11/15/22 2050  AST 30  ALT 27  ALKPHOS 101  BILITOT 0.6  PROT 7.4  ALBUMIN 3.5   Recent Labs  Lab 11/15/22 2050  LIPASE 23   No results for input(s): "AMMONIA" in the last 168 hours. Coagulation Profile: No results for input(s): "INR", "PROTIME" in the last 168 hours. Cardiac Enzymes: Recent Labs  Lab 11/15/22 2050 11/15/22 2224  TROPONINIHS 10 9   BNP (last 3 results) No results for input(s): "PROBNP" in the last 8760 hours. HbA1C: No results for input(s): "HGBA1C" in the last 72 hours. CBG: No results for input(s): "GLUCAP" in the last 168 hours. Lipid Profile: No results for input(s): "CHOL", "HDL", "LDLCALC", "TRIG", "CHOLHDL", "LDLDIRECT" in the last 72 hours. Thyroid Function Tests: No results for input(s): "TSH", "T4TOTAL", "FREET4", "T3FREE", "THYROIDAB" in the last 72 hours. Anemia Panel: No results for input(s): "VITAMINB12", "FOLATE", "FERRITIN", "TIBC", "IRON", "RETICCTPCT" in the last 72 hours. Urine  analysis:    Component Value Date/Time   COLORURINE STRAW (A) 07/25/2022 1053   APPEARANCEUR CLEAR 07/25/2022 1053   LABSPEC 1.024 07/25/2022 1053   PHURINE 5.0 07/25/2022 1053   GLUCOSEU >=500 (A) 07/25/2022 1053   HGBUR NEGATIVE 07/25/2022 1053   BILIRUBINUR NEGATIVE 07/25/2022 1053   KETONESUR NEGATIVE 07/25/2022 1053   PROTEINUR NEGATIVE 07/25/2022 1053   NITRITE NEGATIVE 07/25/2022 1053   LEUKOCYTESUR NEGATIVE 07/25/2022 1053    Radiological Exams on Admission: I have personally reviewed images CT Angio Chest PE W/Cm &/Or Wo Cm  Result Date: 11/15/2022 CLINICAL DATA:  Pulmonary embolus suspected with high probability. Weakness and headache for 3 days. Nausea and vomiting. EXAM: CT ANGIOGRAPHY CHEST WITH CONTRAST TECHNIQUE: Multidetector CT imaging of the chest was performed using the standard protocol during bolus administration of intravenous contrast. Multiplanar CT image reconstructions and MIPs were obtained to evaluate the vascular anatomy. RADIATION DOSE REDUCTION: This exam was performed according to the departmental dose-optimization program which includes automated exposure control, adjustment of the mA and/or kV according to patient size and/or use of iterative reconstruction technique. CONTRAST:  40m OMNIPAQUE IOHEXOL 350 MG/ML SOLN COMPARISON:  12/09/2021 FINDINGS: Cardiovascular: There is good opacification of the central and segmental pulmonary arteries resulting in technically adequate study. No focal filling defects are identified. No evidence of significant pulmonary embolus. Normal heart size. No pericardial effusions. Normal caliber thoracic aorta. No aortic dissection. Scattered aortic calcification. Mediastinum/Nodes: Thyroid gland is unremarkable. Esophagus is decompressed. No significant lymphadenopathy. Lungs/Pleura: Diffuse patchy nodular infiltrative changes throughout the right lung and in the left base. Changes are most likely to represent multifocal pneumonia or  aspiration. No pleural effusions. No pneumothorax. Upper Abdomen: No acute abnormalities. Musculoskeletal: Degenerative changes in the spine. Review of the MIP images confirms the above findings. IMPRESSION: 1. No evidence of significant pulmonary embolus. 2. Patchy airspace disease throughout both lungs, most prominent on the right, likely representing multifocal pneumonia. Aspiration would be another consideration  in the setting of nausea and vomiting. Electronically Signed   By: Lucienne Capers M.D.   On: 11/15/2022 23:25   DG Chest 2 View  Result Date: 11/15/2022 CLINICAL DATA:  Hypoxia EXAM: CHEST - 2 VIEW COMPARISON:  Chest x-ray 02/16/2022 FINDINGS: There is some faint patchy opacities in the right lower lung. There is no pleural effusion or pneumothorax. The cardiomediastinal silhouette is within normal limits. Osseous structures are within normal limits. IMPRESSION: Faint patchy opacities in the right lower lung may represent atelectasis or infection. Electronically Signed   By: Ronney Asters M.D.   On: 11/15/2022 21:46    EKG: My personal interpretation of EKG shows: sinus tachycardia    Assessment/Plan Principal Problem:   Pneumonia Active Problems:   Sepsis with acute organ dysfunction (HCC)   Uncontrolled type 2 diabetes mellitus with hyperglycemia (HCC)   Essential hypertension   Diabetic polyneuropathy associated with type 2 diabetes mellitus (HCC)   S/P BKA (below knee amputation) unilateral, left (HCC)   GERD without esophagitis   Mixed diabetic hyperlipidemia associated with type 2 diabetes mellitus ([place])   Acute respiratory failure with hypoxia (HCC)   AKI (acute kidney injury) ([place])    Assessment and Plan: * Pneumonia Observation med/surg bed. Pt with N/V the last 3 days. Possible aspiration. Start IV Unasyn. Clear liquid diet. Consider swallow evaluation if not improving on standard abx therapy.  Sepsis with acute organ dysfunction ([place]) Tachycardic on arrival with  HR 115. WBC 18K. Pneumonia on CTA. AKI due to elevated Scr of 1.77.  baseline Scr 0.95.  acute respiratory failure with hypoxia due to need for supplemental O2 and RA sats if 86%.  Fulfills sepsis with acute organ dysfunction criteria.  Acute respiratory failure with hypoxia (HCC) 86% on RA. Currently on supplemental O2. Does not use O2 at SNF. Wean O2 as tolerated.  Mixed diabetic hyperlipidemia associated with type 2 diabetes mellitus (HCC) Continue lipitor 20 mg daily.  GERD without esophagitis Continue protonix 40 mg daily.  S/P BKA (below knee amputation) unilateral, left (HCC) Stable.  Patient states that he has had a left lower leg prosthesis for about 6 months.  He only wears it about 2 to 3 hours a day at the nursing home.  He is mostly wheelchair-bound.  He plans on living at the nursing home long-term.  Patient recently seen by vascular surgery last week by Dr. Unk Lightning.  Reportedly he has good circulation in his right foot.  With toe pressures of 153 mmHg.  Adequate for healing.  Diabetic polyneuropathy associated with type 2 diabetes mellitus (HCC) Stable. Pt asking for IV dilaudid. No indication for IV pain meds. Prn oxycodone 10 mg q12h prn.Reubin [facility] find pt's SNF MAR. Not even sure pt is on oxycodone at SNF.  Essential hypertension Continue norvasc 5 mg. Hold lasix and ARB for now given his sepsis and need for IVF.  Uncontrolled type 2 diabetes mellitus with hyperglycemia (HCC) Hold metformin. Continue glipizide. Add SSI.  AKI (acute kidney injury) ([place]) Continue with IV fluids.  Hold ARB and diuretics.   DVT prophylaxis: SQ Heparin Code Status: Full Code Family Communication: no family at bedside  Disposition Plan: return to SNF  Consults called: none  Admission status: Observation, Med-Surg   Kristopher Oppenheim, DO [facility] 11/16/2022, 1:00 AM

## 2022-11-16 NOTE — Assessment & Plan Note (Signed)
Continue lipitor 20 mg daily.

## 2022-11-16 NOTE — Assessment & Plan Note (Addendum)
Tachycardic on arrival with HR 115. WBC 18K. Pneumonia on CTA. AKI due to elevated Scr of 1.77.  baseline Scr 0.95.  acute respiratory failure with hypoxia due to need for supplemental O2 and RA sats if 86%.  Fulfills sepsis with acute organ dysfunction criteria.

## 2022-11-16 NOTE — Assessment & Plan Note (Signed)
Stable. Pt asking for IV dilaudid. No indication for IV pain meds. Prn oxycodone 10 mg q12h prn.Re[facility] find pt's SNF MAR. Not even sure pt is on oxycodone at SNF.

## 2022-11-17 ENCOUNTER — Inpatient Hospital Stay (HOSPITAL_COMMUNITY): Payer: Medicare Other

## 2022-11-17 DIAGNOSIS — J189 Pneumonia, unspecified organism: Secondary | ICD-10-CM | POA: Diagnosis not present

## 2022-11-17 DIAGNOSIS — J9601 Acute respiratory failure with hypoxia: Secondary | ICD-10-CM | POA: Diagnosis not present

## 2022-11-17 LAB — BASIC METABOLIC PANEL
Anion gap: 10 (ref 5–15)
BUN: 14 mg/dL (ref 6–20)
CO2: 27 mmol/L (ref 22–32)
Calcium: 8.8 mg/dL — ABNORMAL LOW (ref 8.9–10.3)
Chloride: 100 mmol/L (ref 98–111)
Creatinine, Ser: 1.3 mg/dL — ABNORMAL HIGH (ref 0.61–1.24)
GFR, Estimated: >60 mL/min (ref >60)
Glucose, Bld: 239 mg/dL — ABNORMAL HIGH (ref 70–99)
Potassium: 4 mmol/L (ref 3.5–5.1)
Sodium: 137 mmol/L (ref 135–145)

## 2022-11-17 LAB — GLUCOSE, CAPILLARY
Glucose-Capillary: 219 mg/dL — ABNORMAL HIGH (ref 70–99)
Glucose-Capillary: 266 mg/dL — ABNORMAL HIGH (ref 70–99)
Glucose-Capillary: 206 mg/dL — ABNORMAL HIGH (ref 70–99)
Glucose-Capillary: 260 mg/dL — ABNORMAL HIGH (ref 70–99)

## 2022-11-17 LAB — CBC
HCT: 38.5 % — ABNORMAL LOW (ref 39.0–52.0)
Hemoglobin: 12.4 g/dL — ABNORMAL LOW (ref 13.0–17.0)
MCH: 26.5 pg (ref 26.0–34.0)
MCHC: 32.2 g/dL (ref 30.0–36.0)
MCV: 82.3 fL (ref 80.0–100.0)
Platelets: 241 10*3/uL (ref 150–400)
RBC: 4.68 MIL/uL (ref 4.22–5.81)
RDW: 13.5 % (ref 11.5–15.5)
WBC: 6.5 10*3/uL (ref 4.0–10.5)
nRBC: 0 % (ref 0.0–0.2)

## 2022-11-17 MED ORDER — PREGABALIN 100 MG PO CAPS
100.0000 mg | ORAL_CAPSULE | Freq: Two times a day (BID) | ORAL | Status: DC
  Administered 2022-11-17 – 2022-11-18 (×3): 100 mg via ORAL
  Filled 2022-11-17 (×3): qty 1

## 2022-11-17 MED ORDER — OXYCODONE HCL 5 MG PO TABS
5.0000 mg | ORAL_TABLET | Freq: Four times a day (QID) | ORAL | Status: DC | PRN
  Administered 2022-11-17 (×3): 5 mg via ORAL
  Administered 2022-11-18 (×2): 10 mg via ORAL
  Filled 2022-11-17: qty 2
  Filled 2022-11-17: qty 1
  Filled 2022-11-17 (×2): qty 2
  Filled 2022-11-17: qty 1

## 2022-11-17 MED ORDER — SODIUM CHLORIDE 0.45 % IV SOLN: INTRAVENOUS | Status: AC

## 2022-11-17 MED ORDER — AMOXICILLIN-POT CLAVULANATE 875-125 MG PO TABS
1.0000 | ORAL_TABLET | Freq: Two times a day (BID) | ORAL | Status: DC
  Administered 2022-11-17 – 2022-11-18 (×3): 1 via ORAL
  Filled 2022-11-17 (×3): qty 1

## 2022-11-17 MED ORDER — IOHEXOL 350 MG/ML SOLN
75.0000 mL | Freq: Once | INTRAVENOUS | Status: AC | PRN
  Administered 2022-11-17: 75 mL via INTRAVENOUS

## 2022-11-17 NOTE — Plan of Care (Signed)
  Problem: Clinical Measurements: Goal: Ability to maintain a body temperature in the normal range will improve Outcome: Progressing   Problem: Respiratory: Goal: Ability to maintain adequate ventilation will improve Outcome: Progressing Goal: Ability to maintain a clear airway will improve Outcome: Progressing   Problem: Education: Goal: Ability to describe self-care measures that may prevent or decrease complications (Diabetes Survival Skills Education) will improve 11/17/2022 0116 by Kristie Cowman, RN Outcome: Progressing 11/17/2022 0113 by Kristie Cowman, RN Outcome: Progressing   Problem: Coping: Goal: Ability to adjust to condition or change in health will improve Outcome: Progressing

## 2022-11-17 NOTE — Plan of Care (Signed)
  Problem: Clinical Measurements: Goal: Ability to maintain a body temperature in the normal range will improve Outcome: Progressing   Problem: Respiratory: Goal: Ability to maintain adequate ventilation will improve Outcome: Progressing Goal: Ability to maintain a clear airway will improve Outcome: Progressing   Problem: Education: Goal: Ability to describe self-care measures that may prevent or decrease complications (Diabetes Survival Skills Education) will improve Outcome: Progressing

## 2022-11-17 NOTE — Progress Notes (Signed)
Patient is back on the floor at 1705PM

## 2022-11-17 NOTE — Progress Notes (Signed)
Patient was transferred to CT via bed and 1L O2 ,[place] at 1620Pm

## 2022-11-17 NOTE — Progress Notes (Signed)
[facility] PROGRESS NOTE   Danik Lanyon [id number] DOB: 01-Mar-1961 DOA: 11/15/2022  PCP: [facility]  Brief History/Interval Summary: 62 year old Caucasian male history of left BKA, uncontrolled type 2 diabetes, hypertension, diabetic polyneuropathy, currently living in a nursing home [facility]) presents to ER via EMS.  Reportedly patient was confused and hypoxic.  Room air saturations were reportedly 86%.  Patient states that he had nausea and vomiting for last 3 days.  He states that he "passed out" as well.  Patient found to have pneumonia on imaging studies.  He was hospitalized for further management.     Consultants: None  Procedures: None    Subjective/Interval History: Patient mentions that his cough is getting better.  Shortness of breath is getting better.  Complains of pain in his right foot feels like fire ants.  Usually takes something for neuropathy.  No nausea or vomiting.  Assessment/Plan:  Aspiration pneumonia/acute respiratory failure with hypoxia/sepsis present on admission Patient recently had nausea and vomiting which could have resulted in aspiration.  He was noted to be hypoxic at presentation.  WBC was noted to be elevated.  Started on Unasyn.  He was septic at the time of admission with tachycardia elevated WBC and pneumonia on imaging studies.  Sepsis physiology has improved. Patient's influenza, RSV and COVID-19 PCR's were negative.  UA unremarkable. Patient's respiratory status seems to be improving.  Decreased oxygen to 1 L/min.  Nursing to try and wean him off for by the end of the day.  Will change Unasyn to Augmentin.  Acute kidney injury Presented with creatinine of 1.7.  Baseline creatinine seems to be around 1.2.  Has been hydrated.   Renal function now close to baseline.  IV fluids can be discontinued.  Continue to hold ARB and diuretics for another day.    Cystic lesion in the popliteal area in the right lower  extremity Incidentally noted on venous Doppler studies.  Will do CT to further evaluate.  May need to be seen by orthopedics in the outpatient setting.  Diabetes mellitus type 2 with diabetic neuropathy Monitor CBGs.  Continue with SSI and glargine.  No recent HbA1c.  HbA1c was 10.8 in September.  Hyperlipidemia Continue with Lipitor.  Essential hypertension Continue with amlodipine.  Holding ARB and diuretics.  Status post left BKA Stable.  Has edema in the right lower extremities.  No DVT on Doppler studies.  Recent ABIs were unremarkable in the right lower extremity.  His pain is likely due to neuropathy.  Chronic pain syndrome Patient mentions that he takes oxycodone every 6 hours at his nursing facility.  Prescriber database was reviewed.  The last time he was prescribed oxycodone was in August 2023, 120 tablets were prescribed. Pharmacy tech was able to verify his medications at [facility].  He is indeed on oxycodone.  This will be represcribed.  DVT Prophylaxis: Subcutaneous heparin Code Status: Full code Family Communication: Discussed with patient Disposition Plan: Hopefully back to skilled nursing facility when improved   Status is: Inpatient Remains inpatient appropriate because: Aspiration pneumonia.  Acute respiratory failure with hypoxia.      Medications: Scheduled:  amLODipine  10 mg Oral q morning   amoxicillin-clavulanate  1 tablet Oral Q12H   atorvastatin  20 mg Oral QHS   heparin  5,000 Units Subcutaneous Q8H   insulin aspart  0-15 Units Subcutaneous TID WC   insulin aspart  0-5 Units Subcutaneous QHS   insulin glargine-yfgn  10 Units Subcutaneous QHS  pantoprazole  40 mg Oral Daily   pregabalin  100 mg Oral BID   Continuous:   [id number] **OR** acetaminophen, albuterol, ondansetron **OR** ondansetron (ZOFRAN) IV, oxyCODONE  Antibiotics: Anti-infectives (From admission, onward)    Start     Dose/Rate Route Frequency Ordered Stop    11/17/22 1000  amoxicillin-clavulanate (AUGMENTIN) 875-125 MG per tablet 1 tablet        1 tablet Oral Every 12 hours 11/17/22 0856     11/16/22 0200  Ampicillin-Sulbactam (UNASYN) 3 g in sodium chloride 0.9 % 100 mL IVPB  Status:  Discontinued        3 g 200 mL/hr over 30 Minutes Intravenous Every 6 hours 11/16/22 0101 11/17/22 0856   11/16/22 0000  cefTRIAXone (ROCEPHIN) 2 g in sodium chloride 0.9 % 100 mL IVPB        2 g 200 mL/hr over 30 Minutes Intravenous  Once 11/15/22 2346 11/16/22 0043   11/16/22 0000  azithromycin (ZITHROMAX) 500 mg in sodium chloride 0.9 % 250 mL IVPB        500 mg 250 mL/hr over 60 Minutes Intravenous  Once 11/15/22 2346 11/16/22 0114       Objective:  Vital Signs  Vitals:   11/16/22 0700 11/16/22 2026 11/17/22 0237 11/17/22 0746  BP: (!) 107/49 120/66 (!) 145/83 (!) 143/93  Pulse: 87 89  86  Resp: 16 16 18   $ Temp: 98 F (36.7 C) 98.1 F (36.7 C) 97.6 F (36.4 C) (!) 97.4 F (36.3 C)  TempSrc: Oral  Oral   SpO2: 97% 95% 96% 98%  Weight:      Height:        Intake/Output Summary (Last 24 hours) at 11/17/2022 0900 Last data filed at 11/17/2022 0242 Gross per 24 hour  Intake 599.58 ml  Output 2280 ml  Net -1680.42 ml    Filed Weights   11/15/22 1944  Weight: 97.5 kg   General appearance: Awake alert.  In no distress Resp: Normal effort at rest.  Improved air entry.  Fewer crackles appreciated today.  No wheezing or rhonchi. Cardio: S1-S2 is normal regular.  No S3-S4.  No rubs murmurs or bruit GI: Abdomen is soft.  Nontender nondistended.  Bowel sounds are present normal.  No masses organomegaly Extremities: Status post left BKA previously.  Improved edema in the right lower extremities.  No erythema. Neurologic: Alert and oriented x3.  No focal neurological deficits.     Lab Results:  Data Reviewed: I have personally reviewed following labs and reports of the imaging studies  CBC: Recent Labs  Lab 11/15/22 2050 11/17/22 0355   WBC 18.4* 6.5  HGB 12.7* 12.4*  HCT 38.8* 38.5*  MCV 82.4 82.3  PLT 279 241     Basic Metabolic Panel: Recent Labs  Lab 11/15/22 2050 11/16/22 0825 11/17/22 0355  NA 135 137 137  K 4.5 4.7 4.0  CL 97* 103 100  CO2 25 23 27  $ GLUCOSE 228* 201* 239*  BUN 26* 22* 14  CREATININE 1.77* 1.45* 1.30*  CALCIUM 9.0 8.4* 8.8*     GFR: Estimated Creatinine Clearance: 71.1 mL/min (A) (by C-G formula based on SCr of 1.3 mg/dL (H)).  Liver Function Tests: Recent Labs  Lab 11/15/22 2050  AST 30  ALT 27  ALKPHOS 101  BILITOT 0.6  PROT 7.4  ALBUMIN 3.5     Recent Labs  Lab 11/15/22 2050  LIPASE 23      CBG: Recent Labs  Lab 11/16/22 0856  11/16/22 1130 11/16/22 1631 11/16/22 2027 11/17/22 0745  GLUCAP 166* 150* 223* 280* 219*      Recent Results (from the past 240 hour(s))  Resp panel by RT-PCR (RSV, Flu A&B, Covid) Anterior Nasal Swab     Status: None   Collection Time: 11/15/22  9:00 PM   Specimen: Anterior Nasal Swab  Result Value Ref Range Status   SARS Coronavirus 2 by RT PCR NEGATIVE NEGATIVE Final   Influenza A by PCR NEGATIVE NEGATIVE Final   Influenza B by PCR NEGATIVE NEGATIVE Final    Comment: (NOTE) The Xpert Xpress SARS-CoV-2/FLU/RSV plus assay is intended as an aid in the diagnosis of influenza from Nasopharyngeal swab specimens and should not be used as a sole basis for treatment. Nasal washings and aspirates are unacceptable for Xpert Xpress SARS-CoV-2/FLU/RSV testing.  Fact Sheet for Patients: EntrepreneurPulse.com.au  Fact Sheet for Healthcare Providers: IncredibleEmployment.be  This test is not yet approved or cleared by the [place] FDA and has been authorized for detection and/or diagnosis of SARS-CoV-2 by FDA under an Emergency Use Authorization (EUA). This EUA will remain in effect (meaning this test can be used) for the duration of the COVID-19 declaration under Section 564(b)(1) of  the Act, 21 U.S.C. section 360bbb-3(b)(1), unless the authorization is terminated or revoked.     Resp Syncytial Virus by PCR NEGATIVE NEGATIVE Final    Comment: (NOTE) Fact Sheet for Patients: EntrepreneurPulse.com.au  Fact Sheet for Healthcare Providers: IncredibleEmployment.be  This test is not yet approved or cleared by the [place] FDA and has been authorized for detection and/or diagnosis of SARS-CoV-2 by FDA under an Emergency Use Authorization (EUA). This EUA will remain in effect (meaning this test can be used) for the duration of the COVID-19 declaration under Section 564(b)(1) of the Act, 21 U.S.C. section 360bbb-3(b)(1), unless the authorization is terminated or revoked.  Performed at [facility], [place] [address] [place], [place] [address]       Radiology Studies: VAS [place] LOWER EXTREMITY VENOUS (DVT) (7a-7p)  Result Date: 11/16/2022  Lower Venous DVT Study Patient Name:  LORELL SMERIGLIO  Date of Exam:   11/16/2022 Medical Rec #: [id number]       Accession #:    [id number] Date of Birth: 21-Jan-1961       Patient Gender: M Patient Age:   8 years Exam Location:  [facility] Procedure:      VAS [place] LOWER EXTREMITY VENOUS (DVT) Referring Phys: ERIC CHEN --------------------------------------------------------------------------------  Indications: Right leg swelling. Patient endorses intermittent swelling x1 month, now worsening.  Comparison Study: History of PAD and left BKA. Performing Technologist: Darlin Coco RDMS, RVT  Examination Guidelines: A complete evaluation includes B-mode imaging, spectral Doppler, color Doppler, and power Doppler as needed of all accessible portions of each vessel. Bilateral testing is considered an integral part of a complete examination. Limited examinations for reoccurring indications may be performed as noted. The reflux portion of the exam is performed with the patient in reverse Trendelenburg.   +---------+---------------+---------+-----------+----------+--------------+ RIGHT    CompressibilityPhasicitySpontaneityPropertiesThrombus Aging +---------+---------------+---------+-----------+----------+--------------+ CFV      Full           Yes      Yes                                 +---------+---------------+---------+-----------+----------+--------------+ SFJ      Full                                                        +---------+---------------+---------+-----------+----------+--------------+  FV Prox  Full                                                        +---------+---------------+---------+-----------+----------+--------------+ FV Mid   Full                                                        +---------+---------------+---------+-----------+----------+--------------+ FV DistalFull                                                        +---------+---------------+---------+-----------+----------+--------------+ PFV      Full                                                        +---------+---------------+---------+-----------+----------+--------------+ POP      Full           Yes      Yes                                 +---------+---------------+---------+-----------+----------+--------------+ PTV      Full                                                        +---------+---------------+---------+-----------+----------+--------------+ PERO     Full                                                        +---------+---------------+---------+-----------+----------+--------------+   +----+---------------+---------+-----------+----------+--------------+ LEFTCompressibilityPhasicitySpontaneityPropertiesThrombus Aging +----+---------------+---------+-----------+----------+--------------+ CFV Full           Yes      Yes                                  +----+---------------+---------+-----------+----------+--------------+     Summary: RIGHT: - There is no evidence of deep vein thrombosis in the lower extremity.  - A cystic structure is found in the popliteal fossa and extending into the proximal to mid calf, superficial to the muscle fascia.  LEFT: - No evidence of common femoral vein obstruction.  *See table(s) above for measurements and observations. Electronically signed by Deitra Mayo MD on 11/16/2022 at 5:09:26 PM.    Final    CT Angio Chest PE W/Cm &/Or Wo Cm  Result Date: 11/15/2022 CLINICAL DATA:  Pulmonary embolus suspected with high probability. Weakness and headache for 3 days. Nausea and vomiting. EXAM: CT ANGIOGRAPHY CHEST WITH CONTRAST TECHNIQUE: Multidetector  CT imaging of the chest was performed using the standard protocol during bolus administration of intravenous contrast. Multiplanar CT image reconstructions and MIPs were obtained to evaluate the vascular anatomy. RADIATION DOSE REDUCTION: This exam was performed according to the departmental dose-optimization program which includes automated exposure control, adjustment of the mA and/or kV according to patient size and/or use of iterative reconstruction technique. CONTRAST:  68m OMNIPAQUE IOHEXOL 350 MG/ML SOLN COMPARISON:  12/09/2021 FINDINGS: Cardiovascular: There is good opacification of the central and segmental pulmonary arteries resulting in technically adequate study. No focal filling defects are identified. No evidence of significant pulmonary embolus. Normal heart size. No pericardial effusions. Normal caliber thoracic aorta. No aortic dissection. Scattered aortic calcification. Mediastinum/Nodes: Thyroid gland is unremarkable. Esophagus is decompressed. No significant lymphadenopathy. Lungs/Pleura: Diffuse patchy nodular infiltrative changes throughout the right lung and in the left base. Changes are most likely to represent multifocal pneumonia or aspiration. No pleural  effusions. No pneumothorax. Upper Abdomen: No acute abnormalities. Musculoskeletal: Degenerative changes in the spine. Review of the MIP images confirms the above findings. IMPRESSION: 1. No evidence of significant pulmonary embolus. 2. Patchy airspace disease throughout both lungs, most prominent on the right, likely representing multifocal pneumonia. Aspiration would be another consideration in the setting of nausea and vomiting. Electronically Signed   By: WLucienne CapersM.D.   On: 11/15/2022 23:25   DG Chest 2 View  Result Date: 11/15/2022 CLINICAL DATA:  Hypoxia EXAM: CHEST - 2 VIEW COMPARISON:  Chest x-ray 02/16/2022 FINDINGS: There is some faint patchy opacities in the right lower lung. There is no pleural effusion or pneumothorax. The cardiomediastinal silhouette is within normal limits. Osseous structures are within normal limits. IMPRESSION: Faint patchy opacities in the right lower lung may represent atelectasis or infection. Electronically Signed   By: ARonney AstersM.D.   On: 11/15/2022 21:46       LOS: 1 day   [facility] Pager on [email or web address]  11/17/2022, 9:00 AM

## 2022-11-18 DIAGNOSIS — J189 Pneumonia, unspecified organism: Secondary | ICD-10-CM | POA: Diagnosis not present

## 2022-11-18 LAB — BASIC METABOLIC PANEL
Anion gap: 9 (ref 5–15)
BUN: 20 mg/dL (ref 6–20)
CO2: 25 mmol/L (ref 22–32)
Calcium: 8.7 mg/dL — ABNORMAL LOW (ref 8.9–10.3)
Chloride: 101 mmol/L (ref 98–111)
Creatinine, Ser: 1.28 mg/dL — ABNORMAL HIGH (ref 0.61–1.24)
GFR, Estimated: >60 mL/min (ref >60)
Glucose, Bld: 264 mg/dL — ABNORMAL HIGH (ref 70–99)
Potassium: 3.9 mmol/L (ref 3.5–5.1)
Sodium: 135 mmol/L (ref 135–145)

## 2022-11-18 LAB — GLUCOSE, CAPILLARY
Glucose-Capillary: 284 mg/dL — ABNORMAL HIGH (ref 70–99)
Glucose-Capillary: 264 mg/dL — ABNORMAL HIGH (ref 70–99)

## 2022-11-18 MED ORDER — POLYETHYLENE GLYCOL 3350 17 G PO PACK: 17.0000 g | PACK | Freq: Every day | ORAL | 0 refills | Status: AC

## 2022-11-18 MED ORDER — OXYCODONE HCL 10 MG PO TABS: 10.0000 mg | ORAL_TABLET | Freq: Four times a day (QID) | ORAL | 0 refills | Status: DC | PRN

## 2022-11-18 MED ORDER — PREGABALIN 100 MG PO CAPS: 100.0000 mg | ORAL_CAPSULE | Freq: Two times a day (BID) | ORAL | 0 refills | Status: DC

## 2022-11-18 MED ORDER — AMOXICILLIN-POT CLAVULANATE 875-125 MG PO TABS: 1.0000 | ORAL_TABLET | Freq: Two times a day (BID) | ORAL | Status: AC

## 2022-11-18 MED ORDER — NALOXONE HCL 4 MG/0.1ML NA LIQD: 1.0000 | NASAL | 0 refills | Status: DC

## 2022-11-18 MED ORDER — POLYETHYLENE GLYCOL 3350 17 G PO PACK: 17.0000 g | PACK | Freq: Every day | ORAL | Status: DC

## 2022-11-18 NOTE — TOC Transition Note (Signed)
Transition of Care [facility]) - CM/SW Discharge Note   Patient Details  Name: Brett White MRN: [id number] Date of Birth: [birth year]  [facility] CM/SW Contact:  Amador Cunas,  Phone Number: 11/18/2022, 11:28 AM   Clinical Narrative: Pt for dc back to [facility] where he is a LTC resident. Pt aware of dc and reports agreeable. Spoke to [place] in admissions who confirmed they are prepared to admit pt to room 225-B. RN provided with number for report and PTAR arranged for transport. SW signing off at dc.   Wandra Feinstein, MSW, LCSW [phone] (coverage)        Final next level of care: Skilled Nursing Facility Barriers to Discharge: Continued Medical Work up   Patient Goals and CMS Choice CMS Medicare.gov Compare Post Acute Care list provided to:: Patient Choice offered to / list presented to : Patient  Discharge Placement                Patient chooses bed at: [facility] Patient to be transferred to facility by: [place] Name of family member notified: Pt Ox4 and will update family Patient and family notified of of transfer: 11/18/22  Discharge Plan and Services Additional resources added to the After Visit Summary for     Discharge Planning Services: CM Consult Post Acute Care Choice: Resumption of Svcs/PTA Provider (LTC patient at [facility])                               Social Determinants of Health ([place]) Interventions SDOH Screenings   Food Insecurity: No Food Insecurity (11/16/2022)  Housing: Low Risk  (11/16/2022)  Transportation Needs: No Transportation Needs (11/16/2022)  Utilities: Not At Risk (11/16/2022)  Tobacco Use: Low Risk  (11/16/2022)     Readmission Risk Interventions    11/16/2022    2:57 PM  Readmission Risk Prevention Plan  Transportation Screening Complete  Medication Review ([place]) Complete  PCP or Specialist appointment within 3-5 days of discharge Complete  HRI or [place] Complete  SW Recovery Care/Counseling Consult Complete  Palliative Care Screening Complete  Skilled Nursing Facility Complete

## 2022-11-18 NOTE — Discharge Summary (Addendum)
[facility]  Physician Discharge Summary   Patient ID: Brett White MRN: [id number] DOB/AGE: 11-10-1960 62 y.o.  Admit date: 11/15/2022 Discharge date:   11/18/2022   PCP: [facility], [place]:  Aspiration pneumonia   Sepsis with acute organ dysfunction ([place])   Uncontrolled type 2 diabetes mellitus with hyperglycemia ([place])   Essential hypertension   Diabetic polyneuropathy associated with type 2 diabetes mellitus ([place])   S/P BKA (below knee amputation) unilateral, left ([place])   GERD without esophagitis   Mixed diabetic hyperlipidemia associated with type 2 diabetes mellitus ([place])   Acute respiratory failure with hypoxia ([place]), resolved   AKI (acute kidney injury) ([place]), resolved Dissected Baker's cyst  RECOMMENDATIONS FOR OUTPATIENT FOLLOW UP: Please schedule appointment to see Dr. Sharol Given with orthopedics in the next few weeks for his Baker's cyst Monitor CBGs at skilled nursing facility.  Consider addition of long-acting insulin if glucose levels remain greater than 180.   Home Health: SNF Equipment/Devices: None  CODE STATUS: Full code  DISCHARGE CONDITION: fair  Diet recommendation: Modified carbohydrate  INITIAL HISTORY:  62 year old Caucasian male history of left BKA, uncontrolled type 2 diabetes, hypertension, diabetic polyneuropathy, currently living in a nursing home [facility]) presents to ER via EMS.  Reportedly patient was confused and hypoxic.  Room air saturations were reportedly 86%.  Patient states that he had nausea and vomiting for last 3 days.  He states that he "passed out" as well.  Patient found to have pneumonia on imaging studies.  He was hospitalized for further management.       HOSPITAL COURSE:   Aspiration pneumonia/acute respiratory failure with hypoxia/sepsis present on admission Patient recently had nausea and vomiting which could have resulted in aspiration.  He was noted to be hypoxic at presentation.   WBC was noted to be elevated.  Started on Unasyn.  He was septic at the time of admission with tachycardia elevated WBC and pneumonia on imaging studies.  Sepsis physiology has improved. Patient's influenza, RSV and COVID-19 PCR's were negative.  UA unremarkable. Patient's respiratory status has improved.  He has been weaned off of oxygen.  Unasyn was changed over to Augmentin.  Will continue for 5 more days.   Acute kidney injury Presented with creatinine of 1.7.  Baseline creatinine seems to be around 1.2.  Has been hydrated.  Renal function back to baseline.   Dissected Baker's cyst in the right lower extremity He was incidentally found to have a cystic lesion in his right popliteal area on venous Doppler study.  CT scan suggested dissecting Baker's cyst.  Discussed with Dr. Erlinda Hong with orthopedics.  No intervention required since patient is asymptomatic.  Outpatient follow-up.     Diabetes mellitus type 2 with diabetic neuropathy Continue with home medications.  Monitor CBGs.   Hyperlipidemia Continue with Lipitor.   Essential hypertension Resume home medications.   Status post left BKA Stable.  Has edema in the right lower extremities.  No DVT on Doppler studies.  Recent ABIs were unremarkable in the right lower extremity.  His pain is likely due to neuropathy.  Pregabalin.   Chronic pain syndrome Pharmacy tech was able to verify his medications at [facility].  He is indeed on oxycodone.  This will be represcribed.   Patient is stable.  Okay for discharge to SNF.   PERTINENT LABS:  The results of significant diagnostics from this hospitalization (including imaging, microbiology, ancillary and laboratory) are listed below for reference.    Microbiology: Recent Results (from  the past 240 hour(s))  Resp panel by RT-PCR (RSV, Flu A&B, Covid) Anterior Nasal Swab     Status: None   Collection Time: 11/15/22  9:00 PM   Specimen: Anterior Nasal Swab  Result Value Ref Range Status   SARS  Coronavirus 2 by RT PCR NEGATIVE NEGATIVE Final   Influenza A by PCR NEGATIVE NEGATIVE Final   Influenza B by PCR NEGATIVE NEGATIVE Final    Comment: (NOTE) The Xpert Xpress SARS-CoV-2/FLU/RSV plus assay is intended as an aid in the diagnosis of influenza from Nasopharyngeal swab specimens and should not be used as a sole basis for treatment. Nasal washings and aspirates are unacceptable for Xpert Xpress SARS-CoV-2/FLU/RSV testing.  Fact Sheet for Patients: EntrepreneurPulse.com.au  Fact Sheet for Healthcare Providers: IncredibleEmployment.be  This test is not yet approved or cleared by the [place] FDA and has been authorized for detection and/or diagnosis of SARS-CoV-2 by FDA under an Emergency Use Authorization (EUA). This EUA will remain in effect (meaning this test can be used) for the duration of the COVID-19 declaration under Section 564(b)(1) of the Act, 21 U.S.C. section 360bbb-3(b)(1), unless the authorization is terminated or revoked.     Resp Syncytial Virus by PCR NEGATIVE NEGATIVE Final    Comment: (NOTE) Fact Sheet for Patients: EntrepreneurPulse.com.au  Fact Sheet for Healthcare Providers: IncredibleEmployment.be  This test is not yet approved or cleared by the [place] FDA and has been authorized for detection and/or diagnosis of SARS-CoV-2 by FDA under an Emergency Use Authorization (EUA). This EUA will remain in effect (meaning this test can be used) for the duration of the COVID-19 declaration under Section 564(b)(1) of the Act, 21 U.S.C. section 360bbb-3(b)(1), unless the authorization is terminated or revoked.  Performed at [facility], [place] [address] [place], [place] [address]      Labs:   Basic Metabolic Panel: Recent Labs  Lab 11/15/22 2050 11/16/22 0825 11/17/22 0355 11/18/22 0216  NA 135 137 137 135  K 4.5 4.7 4.0 3.9  CL 97* 103 100 101  CO2 25  23 27 25  $ GLUCOSE 228* 201* 239* 264*  BUN 26* 22* 14 20  CREATININE 1.77* 1.45* 1.30* 1.28*  CALCIUM 9.0 8.4* 8.8* 8.7*   Liver Function Tests: Recent Labs  Lab 11/15/22 2050  AST 30  ALT 27  ALKPHOS 101  BILITOT 0.6  PROT 7.4  ALBUMIN 3.5   Recent Labs  Lab 11/15/22 2050  LIPASE 23    CBC: Recent Labs  Lab 11/15/22 2050 11/17/22 0355  WBC 18.4* 6.5  HGB 12.7* 12.4*  HCT 38.8* 38.5*  MCV 82.4 82.3  PLT 279 241     CBG: Recent Labs  Lab 11/17/22 0745 11/17/22 1154 11/17/22 1603 11/17/22 2001 11/18/22 0823  GLUCAP 219* 266* 206* 260* 284*     IMAGING STUDIES CT EXTREMITY LOWER RIGHT W CONTRAST  Result Date: 11/18/2022 CLINICAL DATA:  Loss of sensation in the foot.  Pain and swelling. EXAM: CT OF THE LOWER RIGHT EXTREMITY WITH CONTRAST TECHNIQUE: Multidetector CT imaging of the lower right extremity was performed according to the standard protocol following intravenous contrast administration. RADIATION DOSE REDUCTION: This exam was performed according to the departmental dose-optimization program which includes automated exposure control, adjustment of the mA and/or kV according to patient size and/or use of iterative reconstruction technique. CONTRAST:  33m OMNIPAQUE IOHEXOL 350 MG/ML SOLN COMPARISON:  Previous CT scan 06/29/2021 FINDINGS: The knee and ankle joints are maintained. No acute bony findings. Mild  degenerative changes. There is a small knee joint effusion. There is a small Baker's cyst. Cystic lesion also noted in the anterior and superficial aspect of the left medial gastroc muscle. It is possible this is part of a dissecting Baker's cyst but those are typically superficial to the fascia. This was not present on the prior CT scan. The tibia and fibula are intact. No fracture or bone lesion. No significant bony abnormalities involving the foot. Calcaneal spurring changes are noted. Mild midfoot degenerative changes. Mild subcutaneous inflammation/edema  but no subcutaneous abscess. No findings for myofasciitis or pyomyositis. Mild fatty atrophy of the calf musculature and advanced fatty atrophy of the foot musculature. The major vascular structures appear patent. No evidence of deep venous thrombosis. IMPRESSION: 1. Mild subcutaneous inflammation/edema but no subcutaneous abscess. 2. No findings for myofasciitis or pyomyositis. 3. No acute bony findings. 4. Small knee joint effusion and small Baker's cyst. 5. Elongated tubular cystic lesion in the anterior and superficial aspect of the left medial gastroc muscle. This may be part of a dissecting Baker's cyst. No findings to suggest this is an abscess as there is no surrounding enhancement or inflammatory changes. Electronically Signed   By: Marijo Sanes M.D.   On: 11/18/2022 08:55   VAS [place] LOWER EXTREMITY VENOUS (DVT) (7a-7p)  Result Date: 11/16/2022  Lower Venous DVT Study Patient Name:  HERSHALL EVANSON  Date of Exam:   11/16/2022 Medical Rec #: [id number]       Accession #:    [id number] Date of Birth: 06-24-1961       Patient Gender: M Patient Age:   74 years Exam Location:  [facility] Procedure:      VAS [place] LOWER EXTREMITY VENOUS (DVT) Referring Phys: ERIC CHEN --------------------------------------------------------------------------------  Indications: Right leg swelling. Patient endorses intermittent swelling x1 month, now worsening.  Comparison Study: History of PAD and left BKA. Performing Technologist: Darlin Coco RDMS, RVT  Examination Guidelines: A complete evaluation includes B-mode imaging, spectral Doppler, color Doppler, and power Doppler as needed of all accessible portions of each vessel. Bilateral testing is considered an integral part of a complete examination. Limited examinations for reoccurring indications may be performed as noted. The reflux portion of the exam is performed with the patient in reverse Trendelenburg.   +---------+---------------+---------+-----------+----------+--------------+ RIGHT    CompressibilityPhasicitySpontaneityPropertiesThrombus Aging +---------+---------------+---------+-----------+----------+--------------+ CFV      Full           Yes      Yes                                 +---------+---------------+---------+-----------+----------+--------------+ SFJ      Full                                                        +---------+---------------+---------+-----------+----------+--------------+ FV Prox  Full                                                        +---------+---------------+---------+-----------+----------+--------------+ FV Mid   Full                                                        +---------+---------------+---------+-----------+----------+--------------+  FV DistalFull                                                        +---------+---------------+---------+-----------+----------+--------------+ PFV      Full                                                        +---------+---------------+---------+-----------+----------+--------------+ POP      Full           Yes      Yes                                 +---------+---------------+---------+-----------+----------+--------------+ PTV      Full                                                        +---------+---------------+---------+-----------+----------+--------------+ PERO     Full                                                        +---------+---------------+---------+-----------+----------+--------------+   +----+---------------+---------+-----------+----------+--------------+ LEFTCompressibilityPhasicitySpontaneityPropertiesThrombus Aging +----+---------------+---------+-----------+----------+--------------+ CFV Full           Yes      Yes                                  +----+---------------+---------+-----------+----------+--------------+     Summary: RIGHT: - There is no evidence of deep vein thrombosis in the lower extremity.  - A cystic structure is found in the popliteal fossa and extending into the proximal to mid calf, superficial to the muscle fascia.  LEFT: - No evidence of common femoral vein obstruction.  *See table(s) above for measurements and observations. Electronically signed by Deitra Mayo MD on 11/16/2022 at 5:09:26 PM.    Final    CT Angio Chest PE W/Cm &/Or Wo Cm  Result Date: 11/15/2022 CLINICAL DATA:  Pulmonary embolus suspected with high probability. Weakness and headache for 3 days. Nausea and vomiting. EXAM: CT ANGIOGRAPHY CHEST WITH CONTRAST TECHNIQUE: Multidetector CT imaging of the chest was performed using the standard protocol during bolus administration of intravenous contrast. Multiplanar CT image reconstructions and MIPs were obtained to evaluate the vascular anatomy. RADIATION DOSE REDUCTION: This exam was performed according to the departmental dose-optimization program which includes automated exposure control, adjustment of the mA and/or kV according to patient size and/or use of iterative reconstruction technique. CONTRAST:  29m OMNIPAQUE IOHEXOL 350 MG/ML SOLN COMPARISON:  12/09/2021 FINDINGS: Cardiovascular: There is good opacification of the central and segmental pulmonary arteries resulting in technically adequate study. No focal filling defects are identified. No evidence of significant pulmonary embolus. Normal heart size. No pericardial effusions. Normal caliber thoracic aorta. No aortic dissection. Scattered aortic calcification.  Mediastinum/Nodes: Thyroid gland is unremarkable. Esophagus is decompressed. No significant lymphadenopathy. Lungs/Pleura: Diffuse patchy nodular infiltrative changes throughout the right lung and in the left base. Changes are most likely to represent multifocal pneumonia or aspiration. No pleural  effusions. No pneumothorax. Upper Abdomen: No acute abnormalities. Musculoskeletal: Degenerative changes in the spine. Review of the MIP images confirms the above findings. IMPRESSION: 1. No evidence of significant pulmonary embolus. 2. Patchy airspace disease throughout both lungs, most prominent on the right, likely representing multifocal pneumonia. Aspiration would be another consideration in the setting of nausea and vomiting. Electronically Signed   By: Lucienne Capers M.D.   On: 11/15/2022 23:25   DG Chest 2 View  Result Date: 11/15/2022 CLINICAL DATA:  Hypoxia EXAM: CHEST - 2 VIEW COMPARISON:  Chest x-ray 02/16/2022 FINDINGS: There is some faint patchy opacities in the right lower lung. There is no pleural effusion or pneumothorax. The cardiomediastinal silhouette is within normal limits. Osseous structures are within normal limits. IMPRESSION: Faint patchy opacities in the right lower lung may represent atelectasis or infection. Electronically Signed   By: Ronney Asters M.D.   On: 11/15/2022 21:46   VAS [place] ABI WITH/WO TBI  Result Date: 11/09/2022  LOWER EXTREMITY DOPPLER STUDY Patient Name:  TANE CHOY  Date of Exam:   11/09/2022 Medical Rec #: [id number]       Accession #:    [id number] Date of Birth: 1961-05-18       Patient Gender: M Patient Age:   20 years Exam Location:  [facility] Procedure:      VAS [place] ABI WITH/WO TBI Referring Phys: JOSHUA ROBINS --------------------------------------------------------------------------------  Indications: Poor right LE pulse and loss of sensation. left BKA and by Dr. Sharol Given              secondary to infection. High Risk Factors: Diabetes.  Performing Technologist: Ralene Cork RVT  Examination Guidelines: A complete evaluation includes at minimum, Doppler waveform signals and systolic blood pressure reading at the level of bilateral brachial, anterior tibial, and posterior tibial arteries, when vessel segments are accessible. Bilateral  testing is considered an integral part of a complete examination. Photoelectric Plethysmograph (PPG) waveforms and toe systolic pressure readings are included as required and additional duplex testing as needed. Limited examinations for reoccurring indications may be performed as noted.  ABI Findings: +---------+------------------+-----+---------+--------+ Right    Rt Pressure (mmHg)IndexWaveform Comment  +---------+------------------+-----+---------+--------+ Brachial 157                                      +---------+------------------+-----+---------+--------+ ATA      185               1.18 triphasic         +---------+------------------+-----+---------+--------+ DP       184               1.17 triphasic         +---------+------------------+-----+---------+--------+ Great Toe153               0.97                   +---------+------------------+-----+---------+--------+ +--------+------------------+-----+--------+-------+ Left    Lt Pressure (mmHg)IndexWaveformComment +--------+------------------+-----+--------+-------+ [id number]                                    +--------+------------------+-----+--------+-------+ +-------+-----------+-----------+------------+------------+  ABI/TBIToday's ABIToday's TBIPrevious ABIPrevious TBI +-------+-----------+-----------+------------+------------+ Right  1.18       0.97       [place]          1.35         +-------+-----------+-----------+------------+------------+ Left   BKA                   BKA                      +-------+-----------+-----------+------------+------------+  Previous ABI on 10/06/21.  Summary: Right: Resting right ankle-brachial index is within normal range. The right toe-brachial index is normal. Left: BKA.  *See table(s) above for measurements and observations.  Electronically signed by Orlie Pollen on 11/09/2022 at 1:27:20 PM.    Final     DISCHARGE EXAMINATION: Vitals:   11/17/22 1603  11/17/22 1943 11/18/22 0518 11/18/22 0824  BP: (!) 143/84 138/83 133/70 (!) 151/84  Pulse: 83 95 87 91  Resp:  18 18 18  $ Temp:  98.3 F (36.8 C) 98.6 F (37 C) 97.8 F (36.6 C)  TempSrc:  Oral Oral Oral  SpO2: 96% 96% 92% 97%  Weight:      Height:       General appearance: Awake alert.  In no distress Resp: Clear to auscultation bilaterally.  Normal effort Cardio: S1-S2 is normal regular.  No S3-S4.  No rubs murmurs or bruit GI: Abdomen is soft.  Nontender nondistended.  Bowel sounds are present normal.  No masses organomegaly    DISPOSITION: SNF  Discharge Instructions     Call MD for:  difficulty breathing, headache or visual disturbances   Complete by: As directed    Call MD for:  extreme fatigue   Complete by: As directed    Call MD for:  persistant dizziness or light-headedness   Complete by: As directed    Call MD for:  persistant nausea and vomiting   Complete by: As directed    Call MD for:  severe uncontrolled pain   Complete by: As directed    Call MD for:  temperature >100.4   Complete by: As directed    Diet - low sodium heart healthy   Complete by: As directed    Discharge instructions   Complete by: As directed    Please review instructions on the discharge summary.  You were cared for by a hospitalist during your hospital stay. If you have any questions about your discharge medications or the care you received while you were in the hospital after you are discharged, you can call the unit and asked to speak with the hospitalist on call if the hospitalist that took care of you is not available. Once you are discharged, your primary care physician will handle any further medical issues. Please note that NO REFILLS for any discharge medications will be authorized once you are discharged, as it is imperative that you return to your primary care physician (or establish a relationship with a primary care physician if you do not have one) for your aftercare needs so  that they can reassess your need for medications and monitor your lab values. If you do not have a primary care physician, you can call [phone] for a physician referral.   Increase activity slowly   Complete by: As directed           Allergies as of 11/18/2022   No Known Allergies      Medication List     STOP  taking these medications    cefTRIAXone  IVPB Commonly known as: ROCEPHIN   Pentips 32G X 4 MM Misc Generic drug: Insulin Pen Needle       TAKE these medications    acetaminophen 325 MG tablet Commonly known as: TYLENOL Take 2 tablets (650 mg total) by mouth every 6 (six) hours as needed for mild pain (or Fever >/= 101). What changed: Another medication with the same name was removed. Continue taking this medication, and follow the directions you see here.   amLODipine 10 MG tablet Commonly known as: NORVASC Take 10 mg by mouth every morning.   amoxicillin-clavulanate 875-125 MG tablet Commonly known as: AUGMENTIN Take 1 tablet by mouth every 12 (twelve) hours for 5 days.   atorvastatin 20 MG tablet Commonly known as: LIPITOR Take 20 mg by mouth at bedtime.   BISACODYL PO Take 10 mg by mouth daily as needed (CONSTIPATION).   cyclobenzaprine 5 MG tablet Commonly known as: FLEXERIL Take 1 tablet (5 mg total) by mouth 3 (three) times daily as needed for muscle spasms.   DULoxetine 30 MG capsule Commonly known as: CYMBALTA Take 1 capsule (30 mg total) by mouth daily.   Farxiga 10 MG Tabs tablet Generic drug: dapagliflozin propanediol Take 10 mg by mouth every morning.   furosemide 20 MG tablet Commonly known as: LASIX Take 20 mg by mouth every morning.   glipiZIDE 10 MG tablet Commonly known as: GLUCOTROL Take 10 mg by mouth every morning.   hydrocortisone cream 1 % Apply 1 Application topically in the morning and at bedtime. TO EXTREMITIES FOR ONE WEEK STARTED 11/05/22   losartan 50 MG tablet Commonly known as: COZAAR Take 1 tablet (50 mg  total) by mouth daily.   magnesium hydroxide 400 MG/5ML suspension Commonly known as: MILK OF MAGNESIA Take 30 mLs by mouth daily as needed for mild constipation.   metFORMIN 500 MG tablet Commonly known as: GLUCOPHAGE Take 1 tablet (500 mg total) by mouth 2 (two) times daily with a meal.   mupirocin cream 2 % Commonly known as: BACTROBAN Apply 1 Application topically 2 (two) times daily.   naloxone 4 MG/0.1ML Liqd nasal spray kit Commonly known as: NARCAN Place 1 spray into the nose See admin instructions. Every 2-3 minutesas needed until patient response/ emba arrived.   Oxycodone HCl 10 MG Tabs Take 1 tablet (10 mg total) by mouth every 6 (six) hours as needed (pain).   pantoprazole 40 MG tablet Commonly known as: PROTONIX Take 1 tablet (40 mg total) by mouth daily.   polyethylene glycol 17 g packet Commonly known as: MIRALAX / GLYCOLAX Take 17 g by mouth daily.   pregabalin 100 MG capsule Commonly known as: LYRICA Take 1 capsule (100 mg total) by mouth 2 (two) times daily.   RA Saline Enema 19-7 GM/118ML Enem Place 1 Application rectally daily as needed (constipation).           TOTAL DISCHARGE TIME: 35 minutes  Tareka Jhaveri [employer] on [email or web address]  11/18/2022, 9:35 AM

## 2022-11-18 NOTE — Plan of Care (Signed)

## 2022-11-26 ENCOUNTER — Ambulatory Visit (INDEPENDENT_AMBULATORY_CARE_PROVIDER_SITE_OTHER): Payer: Medicare Other | Admitting: Orthopedic Surgery

## 2022-11-26 DIAGNOSIS — Z89512 Acquired absence of left leg below knee: Secondary | ICD-10-CM

## 2022-11-27 ENCOUNTER — Encounter: Payer: Self-pay | Admitting: Orthopedic Surgery

## 2022-11-27 NOTE — Progress Notes (Signed)
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  Patient has seen vascular surgery regarding the right lower extremity.  Patient states that there are not revascularization options.  Patient has a poor fitting socket and liner on the left.  Patient is a resident at [facility].  Assessment & Plan: Visit Diagnoses:  1. Acquired absence of left leg below knee (HCC)     Plan: Patient is provided a prescription for [employer] for materials and supplies for the left transtibial amputation socket.  Follow-Up Instructions: Return in about 4 weeks (around 12/24/2022).   Ortho Exam  Patient is alert, oriented, no adenopathy, well-dressed, normal affect, normal respiratory effort. Examination patient has stocking glove numbness from the mid tibial distal on the right.  Patient does not have a palpable pulse in the right lower extremity there are no ischemic ulcers.  Patient states he has no active movement of his toes.  There is venous stasis and lymphatic swelling of the right lower extremity but no leg ulcers.  Patient is subsiding into the socket on the left and will need modifications.  Patient is an existing left transtibial  amputee.  Patient's current comorbidities are not expected to impact the ability to function with the prescribed prosthesis. Patient verbally communicates a strong desire to use a prosthesis. Patient currently requires mobility aids to ambulate without a prosthesis.  Expects not to use mobility aids with a new prosthesis.  Patient is a K2 level ambulator that will use a prosthesis to walk around their  home and the community over low level environmental barriers.      Imaging: No results found. No images are attached to the encounter.  Labs: Lab Results  Component Value Date   HGBA1C 10.8 (H) 06/12/2022   HGBA1C 11.1 (H) 06/11/2022   HGBA1C 11.3 (H) 12/10/2021   ESRSEDRATE 22 (H) 06/10/2022   ESRSEDRATE 90 (H) 10/08/2021   ESRSEDRATE 66 (H) 10/05/2021   CRP 1.1 (H) 06/10/2022   CRP 13.5 (H) 10/08/2021   CRP 25.7 (H) 10/05/2021   REPTSTATUS 06/16/2022 FINAL 06/11/2022   GRAMSTAIN  10/06/2021    RARE WBC PRESENT,BOTH PMN AND MONONUCLEAR RARE GRAM POSITIVE COCCI    CULT  06/11/2022    NO GROWTH 5 DAYS Performed at [facility], [place] [address] [place], [place] [address]    LABORGA METHICILLIN RESISTANT STAPHYLOCOCCUS AUREUS 10/06/2021     Lab Results  Component Value Date   ALBUMIN 3.5 11/15/2022   ALBUMIN 3.9 07/25/2022   ALBUMIN 3.8 06/18/2022    Lab Results  Component Value Date   MG 2.0 06/18/2022   MG 1.8 06/12/2022   MG 2.1 02/16/2022   No results found for: "VD25OH"  No results found for: "PREALBUMIN"    Latest Ref Rng & Units 11/17/2022    3:55 AM 11/15/2022    8:50 PM 07/25/2022   10:10 AM  CBC EXTENDED  WBC 4.0 - 10.5 K/uL 6.5  18.4  5.5   RBC 4.22 - 5.81 MIL/uL 4.68  4.71  5.16   Hemoglobin 13.0 - 17.0 g/dL 12.4  12.7  14.0   HCT 39.0 - 52.0 % 38.5  38.8  44.7   Platelets 150 - 400 K/uL 241  279  310   NEUT# 1.7 - 7.7 K/uL   2.9   Lymph# 0.7 - 4.0 K/uL   1.6      There is no height or weight on file to calculate BMI.  Orders:  No orders of the defined types were placed in this encounter.  No orders of the defined types were placed in this encounter.    Procedures: No procedures performed  Clinical Data: No additional findings.  ROS:  All other systems negative, except as noted in the HPI. Review of Systems  Objective: Vital Signs: There were no vitals taken for this visit.  Specialty Comments:  No specialty  comments available.  PMFS History: Patient Active Problem List   Diagnosis Date Noted   Pneumonia 11/16/2022   Acute respiratory failure with hypoxia ([place]) 11/16/2022   Sepsis with acute organ dysfunction ([place]) 11/16/2022   Aspiration pneumonia ([place]) 11/16/2022   GERD without esophagitis 06/11/2022   Mixed diabetic hyperlipidemia associated with type 2 diabetes mellitus ([place]) 06/11/2022   Adjustment disorder with depressed mood 05/23/2022   Chronic hyponatremia 12/09/2021   Uncontrolled type 2 diabetes mellitus with hyperglycemia, without long-term current use of insulin ([place]) 12/09/2021   Tachycardia 12/09/2021   AKI (acute kidney injury) ([place]) 10/04/2021   S/P BKA (below knee amputation) unilateral, left ([place]) 04/05/2021   Uncontrolled type 2 diabetes mellitus with hyperglycemia ([place]) 02/03/2021   Essential hypertension 02/03/2021   Diabetic polyneuropathy associated with type 2 diabetes mellitus ([place]) 02/03/2021   Past Medical History:  Diagnosis Date   CAP (community acquired pneumonia) 12/09/2021   GERD (gastroesophageal reflux disease)    HTN (hypertension)    Insulin dependent type 2 diabetes mellitus ([place])    Neuropathy    Osteomyelitis of great toe of left foot ([place]) 02/03/2021   Stroke ([place])     Family History  Problem Relation Age of Onset   Heart attack Neg Hx     Past Surgical History:  Procedure Laterality Date   AMPUTATION Left 02/04/2021   Procedure: LEFT GREAT TOE AMPUTATION;  Surgeon: Newt Minion, MD;  Location: [facility];  Service: Orthopedics;  Laterality: Left;   AMPUTATION Left 02/10/2021   Procedure: LEFT FOOT 1ST RAY AMPUTATION;  Surgeon: Newt Minion, MD;  Location: [facility];  Service: Orthopedics;  Laterality: Left;   AMPUTATION Left 03/22/2021   Procedure: LEFT BELOW KNEE AMPUTATION;  Surgeon: Newt Minion, MD;  Location: [facility];  Service: Orthopedics;  Laterality: Left;   APPENDECTOMY     BACK SURGERY     I & D EXTREMITY Left 03/03/2021    Procedure: LEFT FOOT DEBRIDEMENT;  Surgeon: Newt Minion, MD;  Location: [facility];  Service: Orthopedics;  Laterality: Left;   INCISION AND DRAINAGE ABSCESS N/A 10/06/2021   Procedure: INCISION AND DRAINAGE BACK ABSCESS;  Surgeon: Clovis Riley, MD;  Location: [facility];  Service: General;  Laterality: N/A;   removal of back cyst     TONSILLECTOMY     Social History   Occupational History   Not on file  Tobacco Use   Smoking status: Never   Smokeless tobacco: Never  Substance and Sexual Activity   Alcohol use: Not Currently   Drug use: Not  Currently   Sexual activity: Not on file

## 2022-12-07 ENCOUNTER — Ambulatory Visit: Payer: Medicare Other

## 2022-12-24 ENCOUNTER — Ambulatory Visit: Payer: Medicare Other | Admitting: Orthopedic Surgery

## 2023-03-05 ENCOUNTER — Ambulatory Visit: Payer: Medicare Other | Admitting: Orthopedic Surgery

## 2023-03-06 ENCOUNTER — Ambulatory Visit (INDEPENDENT_AMBULATORY_CARE_PROVIDER_SITE_OTHER): Payer: Medicare Other | Admitting: Family

## 2023-03-06 ENCOUNTER — Encounter: Payer: Self-pay | Admitting: Family

## 2023-03-06 DIAGNOSIS — L03115 Cellulitis of right lower limb: Secondary | ICD-10-CM

## 2023-03-06 DIAGNOSIS — S91209A Unspecified open wound of unspecified toe(s) with damage to nail, initial encounter: Secondary | ICD-10-CM

## 2023-03-06 DIAGNOSIS — I739 Peripheral vascular disease, unspecified: Secondary | ICD-10-CM | POA: Diagnosis not present

## 2023-03-06 DIAGNOSIS — Z89512 Acquired absence of left leg below knee: Secondary | ICD-10-CM

## 2023-03-06 MED ORDER — DOXYCYCLINE HYCLATE 100 MG PO TABS: 100.0000 mg | ORAL_TABLET | Freq: Two times a day (BID) | ORAL | 0 refills | Status: DC

## 2023-03-06 MED ORDER — HYDROXYZINE HCL 10 MG PO TABS: 10.0000 mg | ORAL_TABLET | Freq: Three times a day (TID) | ORAL | 0 refills | Status: DC | PRN

## 2023-03-06 NOTE — Progress Notes (Signed)
Office Visit Note   Patient: Brett White           Date of Birth: 01-Jan-1961           MRN: [id number] Visit Date: 03/06/2023              Requested by: [facility] [address] [place],  [place] [address] PCP: [facility]  No chief complaint on file.     HPI: The patient is a 62 year old gentleman who presents today for concern of increasing redness with blisters and open areas to the right lower extremity specifically the distal leg.  Endorses chills over the last 4 days.  He states that he is also lost all of his toenails in the last week he reports worsening of the redness and numbness over the last 3 months.  History of diabetes as well as hypertension known PAD.  He was evaluated by vascular in February of this year.  Currently resides at [facility].  Assessment & Plan: Visit Diagnoses:  1. Acquired absence of left leg below knee (HCC)   2. Cellulitis of right leg   3. Avulsion of toenail of right foot   4. PAD (peripheral artery disease) (HCC)     Plan: Will place on doxycycline for cellulitis of the right lower extremity.  Recommend daily Dial soap cleansing close monitoring of the foot.  Will ensure that he has follow-up with Dr. Sherral Hammers.  Follow-Up Instructions: No follow-ups on file.   Ortho Exam  Patient is alert, oriented, no adenopathy, well-dressed, normal affect, normal respiratory effort. Please see attached image On examination right lower extremity there is erythema to the right lower extremity with blistering of the skin.  Serous fluid.  There is no weeping of the right lower extremity trace edema of the all 5 toenails are absent.  No ascending cellulitis  dorsalis pedis pulse is not palpable   Imaging: No results found. No images are attached to the encounter.  Labs: Lab Results  Component Value Date   HGBA1C 10.8 (H) 06/12/2022   HGBA1C 11.1 (H) 06/11/2022   HGBA1C 11.3 (H) 12/10/2021   ESRSEDRATE 22 (H)  06/10/2022   ESRSEDRATE 90 (H) 10/08/2021   ESRSEDRATE 66 (H) 10/05/2021   CRP 1.1 (H) 06/10/2022   CRP 13.5 (H) 10/08/2021   CRP 25.7 (H) 10/05/2021   REPTSTATUS 06/16/2022 FINAL 06/11/2022   GRAMSTAIN  10/06/2021    RARE WBC PRESENT,BOTH PMN AND MONONUCLEAR RARE GRAM POSITIVE COCCI    CULT  06/11/2022    NO GROWTH 5 DAYS Performed at [facility], [address] [place], [place] [address]    LABORGA METHICILLIN RESISTANT STAPHYLOCOCCUS AUREUS 10/06/2021     Lab Results  Component Value Date   ALBUMIN 3.5 11/15/2022   ALBUMIN 3.9 07/25/2022   ALBUMIN 3.8 06/18/2022    Lab Results  Component Value Date   MG 2.0 06/18/2022   MG 1.8 06/12/2022   MG 2.1 02/16/2022   No results found for: "VD25OH"  No results found for: "PREALBUMIN"    Latest Ref Rng & Units 11/17/2022    3:55 AM 11/15/2022    8:50 PM 07/25/2022   10:10 AM  CBC EXTENDED  WBC 4.0 - 10.5 K/uL 6.5  18.4  5.5   RBC 4.22 - 5.81 MIL/uL 4.68  4.71  5.16   Hemoglobin 13.0 - 17.0 g/dL 82.9  56.2  13.0   HCT 39.0 - 52.0 % 38.5  38.8  44.7  Platelets 150 - 400 K/uL 241  279  310   NEUT# 1.7 - 7.7 K/uL   2.9   Lymph# 0.7 - 4.0 K/uL   1.6      There is no height or weight on file to calculate BMI.  Orders:  Orders Placed This Encounter  Procedures   Ambulatory referral to Vascular Surgery   No orders of the defined types were placed in this encounter.    Procedures: No procedures performed  Clinical Data: No additional findings.  ROS:  All other systems negative, except as noted in the HPI. Review of Systems  Objective: Vital Signs: There were no vitals taken for this visit.  Specialty Comments:  No specialty comments available.  PMFS History: Patient Active Problem List   Diagnosis Date Noted   Pneumonia 11/16/2022   Acute respiratory failure with hypoxia (HCC) 11/16/2022   Sepsis with acute organ dysfunction (HCC) 11/16/2022   Aspiration pneumonia (HCC) 11/16/2022   GERD  without esophagitis 06/11/2022   Mixed diabetic hyperlipidemia associated with type 2 diabetes mellitus (HCC) 06/11/2022   Adjustment disorder with depressed mood 05/23/2022   Chronic hyponatremia 12/09/2021   Uncontrolled type 2 diabetes mellitus with hyperglycemia, without long-term current use of insulin (HCC) 12/09/2021   Tachycardia 12/09/2021   AKI (acute kidney injury) (HCC) 10/04/2021   S/P BKA (below knee amputation) unilateral, left (HCC) 04/05/2021   Uncontrolled type 2 diabetes mellitus with hyperglycemia (HCC) 02/03/2021   Essential hypertension 02/03/2021   Diabetic polyneuropathy associated with type 2 diabetes mellitus (HCC) 02/03/2021   Past Medical History:  Diagnosis Date   CAP (community acquired pneumonia) 12/09/2021   GERD (gastroesophageal reflux disease)    HTN (hypertension)    Insulin dependent type 2 diabetes mellitus (HCC)    Neuropathy    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   Stroke (HCC)     Family History  Problem Relation Age of Onset   Heart attack Neg Hx     Past Surgical History:  Procedure Laterality Date   AMPUTATION Left 02/04/2021   Procedure: LEFT GREAT TOE AMPUTATION;  Surgeon: [name], MD;  [facility];  Service: Orthopedics;  Laterality: Left;   AMPUTATION Left 02/10/2021   Procedure: LEFT FOOT 1ST RAY AMPUTATION;  Surgeon: [name], MD;  Location: [facility];  Service: Orthopedics;  Laterality: Left;   AMPUTATION Left 03/22/2021   Procedure: LEFT BELOW KNEE AMPUTATION;  Surgeon: [name], MD;  Location: [facility];  Service: Orthopedics;  Laterality: Left;   APPENDECTOMY     BACK SURGERY     I & D EXTREMITY Left 03/03/2021   Procedure: LEFT FOOT DEBRIDEMENT;  Surgeon: [name], MD;  Location[facility];  Service: Orthopedics;  Laterality: Left;   INCISION AND DRAINAGE ABSCESS N/A 10/06/2021   Procedure: INCISION AND DRAINAGE BACK ABSCESS;  Surgeon: Berna Bue, MD;  Location: [facility] OR;  Service:  General;  Laterality: N/A;   removal of back cyst     TONSILLECTOMY     Social History   Occupational History   Not on file  Tobacco Use   Smoking status: Never   Smokeless tobacco: Never  Substance and Sexual Activity   Alcohol use: Not Currently   Drug use: Not Currently   Sexual activity: Not on file

## 2023-03-06 NOTE — Addendum Note (Signed)
Addended by: Barnie Del R on: 03/06/2023 11:20 AM   Modules accepted: Orders

## 2023-03-20 ENCOUNTER — Ambulatory Visit: Payer: Medicare Other | Admitting: Family

## 2023-03-26 NOTE — Progress Notes (Deleted)
Office Note     CC: Right lower extremity paresthesias Requesting Provider:  [facility]  HPI: Brett White is a 62 y.o. ([birth year]) male presenting at the request of .[facility] And for PAD evaluation with a 22-month history of right lower extremity paresthesias.  Brett White has a history of left lower extremity below-knee amputation from uncontrolled infection.  He also has a history of poorly controlled type 2 diabetes mellitus, stroke.  Had right lower extremity wounds and cellulitis which have all healed in the past.  On exam today, Brett White was doing well, complaining of continued right lower extremity stocking glove neuropathy.  This is not appreciated in the other extremity as he has a below-knee amputation.  Symptoms have been progressive over the last several years but roughly 6 months ago he lost nearly all sensation. Most recently, roughly 2 weeks ago, Brett White noted that his toenails on his right foot began to fall off, some of the earliest to fall off have to regenerate.  He denies ulcerations, rest pain.  Minimally ambulatory, using a walker to help as he has the left-sided below-knee amputation.  When at home, he is in a wheelchair for    Past Medical History:  Diagnosis Date   CAP (community acquired pneumonia) 12/09/2021   GERD (gastroesophageal reflux disease)    HTN (hypertension)    Insulin dependent type 2 diabetes mellitus (HCC)    Neuropathy    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   Stroke [facility])     Past Surgical History:  Procedure Laterality Date   AMPUTATION Left 02/04/2021   Procedure: LEFT GREAT TOE AMPUTATION;  Surgeon: [name], MD;  Location: [facility] OR;  Service: Orthopedics;  Laterality: Left;   AMPUTATION Left 02/10/2021   Procedure: LEFT FOOT 1ST RAY AMPUTATION;  Surgeon: [name], MD;  Location: [facility] OR;  Service: Orthopedics;  Laterality: Left;   AMPUTATION Left 03/22/2021   Procedure: LEFT BELOW KNEE  AMPUTATION;  Surgeon: [name], MD;  Location: [facility] OR;  Service: Orthopedics;  Laterality: Left;   APPENDECTOMY     BACK SURGERY     I & D EXTREMITY Left 03/03/2021   Procedure: LEFT FOOT DEBRIDEMENT;  Surgeon: [name], MD;  Location: [facility] OR;  Service: Orthopedics;  Laterality: Left;   INCISION AND DRAINAGE ABSCESS N/A 10/06/2021   Procedure: INCISION AND DRAINAGE BACK ABSCESS;  Surgeon: Berna Bue, MD;  Location: [facility] OR;  Service: General;  Laterality: N/A;   removal of back cyst     TONSILLECTOMY      Social History   Socioeconomic History   Marital status: Divorced    Spouse name: Not on file   Number of children: Not on file   Years of education: Not on file   Highest education level: Not on file  Occupational History   Not on file  Tobacco Use   Smoking status: Never   Smokeless tobacco: Never  Substance and Sexual Activity   Alcohol use: Not Currently   Drug use: Not Currently   Sexual activity: Not on file  Other Topics Concern   Not on file  Social History Narrative   Not on file   Social Determinants of Health   Financial Resource Strain: Not on file  Food Insecurity: No Food Insecurity (11/16/2022)   Hunger Vital Sign    Worried About Running Out of Food in the Last Year: Never true    Ran Out of Food  in the Last Year: Never true  Transportation Needs: No Transportation Needs (11/16/2022)   PRAPARE - Administrator, Civil Service (Medical): No    Lack of Transportation (Non-Medical): No  Physical Activity: Not on file  Stress: Not on file  Social Connections: Not on file  Intimate Partner Violence: Not At Risk (11/16/2022)   Humiliation, Afraid, Rape, and Kick questionnaire    Fear of Current or Ex-Partner: No    Emotionally Abused: No    Physically Abused: No    Sexually Abused: No   Family History  Problem Relation Age of Onset   Heart attack Neg Hx     Current Outpatient Medications  Medication Sig Dispense Refill    acetaminophen (TYLENOL) 325 MG tablet Take 2 tablets (650 mg total) by mouth every 6 (six) hours as needed for mild pain (or Fever >/= 101).     amLODipine (NORVASC) 10 MG tablet Take 10 mg by mouth every morning.     atorvastatin (LIPITOR) 20 MG tablet Take 20 mg by mouth at bedtime.     BISACODYL PO Take 10 mg by mouth daily as needed (CONSTIPATION).     cyclobenzaprine (FLEXERIL) 5 MG tablet Take 1 tablet (5 mg total) by mouth 3 (three) times daily as needed for muscle spasms. 9 tablet 0   doxycycline (VIBRA-TABS) 100 MG tablet Take 1 tablet (100 mg total) by mouth 2 (two) times daily. 60 tablet 0   DULoxetine (CYMBALTA) 30 MG capsule Take 1 capsule (30 mg total) by mouth daily. 30 capsule 3   FARXIGA 10 MG TABS tablet Take 10 mg by mouth every morning.     furosemide (LASIX) 20 MG tablet Take 20 mg by mouth every morning.     glipiZIDE (GLUCOTROL) 10 MG tablet Take 10 mg by mouth every morning.     hydrocortisone cream 1 % Apply 1 Application topically in the morning and at bedtime. TO EXTREMITIES FOR ONE WEEK STARTED 11/05/22     hydrOXYzine (ATARAX) 10 MG tablet Take 1 tablet (10 mg total) by mouth 3 (three) times daily as needed for itching. 30 tablet 0   losartan (COZAAR) 50 MG tablet Take 1 tablet (50 mg total) by mouth daily.     magnesium hydroxide (MILK OF MAGNESIA) 400 MG/5ML suspension Take 30 mLs by mouth daily as needed for mild constipation.     metFORMIN (GLUCOPHAGE) 500 MG tablet Take 1 tablet (500 mg total) by mouth 2 (two) times daily with a meal. 60 tablet 0   mupirocin cream (BACTROBAN) 2 % Apply 1 Application topically 2 (two) times daily. (Patient not taking: Reported on 11/16/2022) 15 g 0   naloxone (NARCAN) nasal spray 4 mg/0.1 mL Place 1 spray into the nose See admin instructions. Every 2-3 minutesas needed until patient response/ emba arrived. 1 each 0   Oxycodone HCl 10 MG TABS Take 1 tablet (10 mg total) by mouth every 6 (six) hours as needed (pain). 15 tablet 0    pantoprazole (PROTONIX) 40 MG tablet Take 1 tablet (40 mg total) by mouth daily. 30 tablet 3   polyethylene glycol (MIRALAX / GLYCOLAX) 17 g packet Take 17 g by mouth daily. 14 each 0   pregabalin (LYRICA) 100 MG capsule Take 1 capsule (100 mg total) by mouth 2 (two) times daily. 60 capsule 0   Sodium Phosphates (RA SALINE ENEMA) 19-7 GM/118ML ENEM Place 1 Application rectally daily as needed (constipation).     No current facility-administered medications for  this visit.    No Known Allergies   REVIEW OF SYSTEMS:  [X]  denotes positive finding, [ ]  denotes negative finding Cardiac  Comments:  Chest pain or chest pressure:    Shortness of breath upon exertion:    Short of breath when lying flat:    Irregular heart rhythm:        Vascular    Pain in calf, thigh, or hip brought on by ambulation:    Pain in feet at night that wakes you up from your sleep:     Blood clot in your veins:    Leg swelling:         Pulmonary    Oxygen at home:    Productive cough:     Wheezing:         Neurologic    Sudden weakness in arms or legs:     Sudden numbness in arms or legs:     Sudden onset of difficulty speaking or slurred speech:    Temporary loss of vision in one eye:     Problems with dizziness:         Gastrointestinal    Blood in stool:     Vomited blood:         Genitourinary    Burning when urinating:     Blood in urine:        Psychiatric    Major depression:         Hematologic    Bleeding problems:    Problems with blood clotting too easily:        Skin    Rashes or ulcers:        Constitutional    Fever or chills:      PHYSICAL EXAMINATION:  There were no vitals filed for this visit.  General:  WDWN in NAD; vital signs documented above Gait: Not observed HENT: WNL, normocephalic Pulmonary: normal non-labored breathing , without wheezing Cardiac: regular HR Abdomen: soft, NT, no masses Skin: without rashes Vascular Exam/Pulses:  Right Left  Radial 2+  (normal) 2+ (normal)  Ulnar    Femoral    Popliteal    DP  BKA  PT 1+ (weak)    Extremities: without ischemic changes, without Gangrene , without cellulitis; with open wounds;  Open wound bed present at the third toe nailbed, second and third toes nearly-healed Some edema on the right lower extremity superficial abrasions above the malleolus Musculoskeletal: no muscle wasting or atrophy  Neurologic: A&O X 3;  No focal weakness or paresthesias are detected Psychiatric:  The pt has Normal affect.   Non-Invasive Vascular Imaging:   ABI Findings:  +---------+------------------+-----+---------+--------+  Right   Rt Pressure (mmHg)IndexWaveform Comment   +---------+------------------+-----+---------+--------+  Brachial 157                                       +---------+------------------+-----+---------+--------+  ATA     185               1.18 triphasic          +---------+------------------+-----+---------+--------+  DP      184               1.17 triphasic          +---------+------------------+-----+---------+--------+  Great Toe153               0.97                    +---------+------------------+-----+---------+--------+   +--------+------------------+-----+--------+-------+  Left   Lt Pressure (mmHg)IndexWaveformComment  +--------+------------------+-----+--------+-------+  [id number]                                    +--------+------------------+-----+--------+-------+   +-------+-----------+-----------+------------+------------+  ABI/TBIToday's ABIToday's TBIPrevious ABIPrevious TBI  +-------+-----------+-----------+------------+------------+  Right 1.18       0.97       [place]          1.35          +-------+-----------+-----------+------------+------------+  Left  BKA                   BKA                       +-------+-----------+-----------+------------+------------+     ASSESSMENT/PLAN: Brett White  is a 62 y.o. male presenting with a 33-month history of dense neuropathy that has been progressive over the last several years.  ABIs were reviewed demonstrating relatively normal, triphasic waveforms with a sufficient toe pressure necessary for wound healing.  The ABI is likely falsely elevated due to his history of longstanding diabetes.  On physical exam, he had a palpable posterior tibial pulse.  I am unsure as to the etiology of why his toenails have fallen off, however he appears to have a fungal infection involving all of the nail beds.   Brett White's right lower extremity 3rd toe wound and has only been present for 2 weeks, and appears to be healing nicely.  With a toe pressure of 153, I do not think angiography is necessary in an effort to define and improve distal perfusion.  In an effort to ensure the patient heals his foot appropriately, I plan to see him in 1 month's time.  Should his wounds worsen, we would discuss the role of right lower extremity diagnostic angiography with possible intervention in an effort to keep him ambulatory for as long as possible.  Recommend the following which can slow the progression of atherosclerosis and reduce the risk of major adverse cardiac / limb events:  Aspirin 81mg  PO QD.  Atorvastatin 40-80mg  PO QD (or other "high intensity" statin therapy). Complete cessation from all tobacco products. Blood glucose control with goal A1c < 7%. Blood pressure control with goal blood pressure < 140/90 mmHg. Lipid reduction therapy with goal LDL-C <100 mg/dL (<59 if symptomatic from PAD).    Brett Sparrow, MD [facility] [phone] Total time of patient care including pre-visit research, consultation, and documentation greater than 30 minutes

## 2023-03-29 ENCOUNTER — Ambulatory Visit: Payer: Medicare Other | Admitting: Vascular Surgery

## 2023-05-14 ENCOUNTER — Ambulatory Visit: Payer: Medicare Other | Admitting: Vascular Surgery

## 2023-05-14 ENCOUNTER — Encounter: Payer: Self-pay | Admitting: Vascular Surgery

## 2023-05-14 ENCOUNTER — Other Ambulatory Visit: Payer: Self-pay

## 2023-05-14 VITALS — BP 129/76 | HR 102 | Temp 98.4°F | Resp 18 | Ht 74.0 in | Wt 270.0 lb

## 2023-05-14 DIAGNOSIS — S91109A Unspecified open wound of unspecified toe(s) without damage to nail, initial encounter: Secondary | ICD-10-CM

## 2023-05-14 DIAGNOSIS — S91301A Unspecified open wound, right foot, initial encounter: Secondary | ICD-10-CM | POA: Diagnosis not present

## 2023-05-14 NOTE — Progress Notes (Signed)
Patient name: Brett White MRN: [id number] DOB: [birth year] Sex: male  REASON FOR CONSULT: Known PAD  HPI: Brett White is a 62 y.o. male, with history of hypertension, diabetes, stroke that presents for evaluation of right lower extremity wounds.  Previously seen by Dr. Karin Lieu.  In the past Dr. Karin Lieu recommended conservative management as he felt he had normal ABIs with adequate toe pressure.  Ultimately he states since his toenails have fallen off he has nonhealing wound beds in the first third and fourth toe of the right foot.  He had a previous left BKA.  He walks with a walker.  Living at [facility] facility.  States they are doing diligent wound care.   Past Medical History:  Diagnosis Date   CAP (community acquired pneumonia) 12/09/2021   GERD (gastroesophageal reflux disease)    HTN (hypertension)    Insulin dependent type 2 diabetes mellitus (HCC)    Neuropathy    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   Stroke [facility])     Past Surgical History:  Procedure Laterality Date   AMPUTATION Left 02/04/2021   Procedure: LEFT GREAT TOE AMPUTATION;  Surgeon: [name], MD;  Location: [facility] OR;  Service: Orthopedics;  Laterality: Left;   AMPUTATION Left 02/10/2021   Procedure: LEFT FOOT 1ST RAY AMPUTATION;  Surgeon: [name], MD;  Location: [facility] OR;  Service: Orthopedics;  Laterality: Left;   AMPUTATION Left 03/22/2021   Procedure: LEFT BELOW KNEE AMPUTATION;  Surgeon: [name], MD;  Location: [facility] OR;  Service: Orthopedics;  Laterality: Left;   APPENDECTOMY     BACK SURGERY     I & D EXTREMITY Left 03/03/2021   Procedure: LEFT FOOT DEBRIDEMENT;  Surgeon: [name], MD;  Location: [facility] OR;  Service: Orthopedics;  Laterality: Left;   INCISION AND DRAINAGE ABSCESS N/A 10/06/2021   Procedure: INCISION AND DRAINAGE BACK ABSCESS;  Surgeon: Berna Bue, MD;  Location: [facility] OR;  Service: General;  Laterality: N/A;   removal of back cyst     TONSILLECTOMY       Family History  Problem Relation Age of Onset   Heart attack Neg Hx     SOCIAL HISTORY: Social History   Socioeconomic History   Marital status: Divorced    Spouse name: Not on file   Number of children: Not on file   Years of education: Not on file   Highest education level: Not on file  Occupational History   Not on file  Tobacco Use   Smoking status: Never   Smokeless tobacco: Never  Substance and Sexual Activity   Alcohol use: Not Currently   Drug use: Not Currently   Sexual activity: Not on file  Other Topics Concern   Not on file  Social History Narrative   Not on file   Social Determinants of Health   Financial Resource Strain: Not on file  Food Insecurity: No Food Insecurity (11/16/2022)   Hunger Vital Sign    Worried About Running Out of Food in the Last Year: Never true    Ran Out of Food in the Last Year: Never true  Transportation Needs: No Transportation Needs (11/16/2022)   PRAPARE - Administrator, Civil Service (Medical): No    Lack of Transportation (Non-Medical): No  Physical Activity: Not on file  Stress: Not on file  Social Connections: Not on file  Intimate Partner Violence: Not At Risk (11/16/2022)   Humiliation, Afraid, Rape,  and Kick questionnaire    Fear of Current or Ex-Partner: No    Emotionally Abused: No    Physically Abused: No    Sexually Abused: No    No Known Allergies  Current Outpatient Medications  Medication Sig Dispense Refill   acetaminophen (TYLENOL) 325 MG tablet Take 2 tablets (650 mg total) by mouth every 6 (six) hours as needed for mild pain (or Fever >/= 101).     amLODipine (NORVASC) 10 MG tablet Take 10 mg by mouth every morning.     atorvastatin (LIPITOR) 20 MG tablet Take 20 mg by mouth at bedtime.     BISACODYL PO Take 10 mg by mouth daily as needed (CONSTIPATION).     cyclobenzaprine (FLEXERIL) 5 MG tablet Take 1 tablet (5 mg total) by mouth 3 (three) times daily as needed for muscle spasms. 9  tablet 0   doxycycline (VIBRA-TABS) 100 MG tablet Take 1 tablet (100 mg total) by mouth 2 (two) times daily. 60 tablet 0   DULoxetine (CYMBALTA) 30 MG capsule Take 1 capsule (30 mg total) by mouth daily. 30 capsule 3   FARXIGA 10 MG TABS tablet Take 10 mg by mouth every morning.     furosemide (LASIX) 20 MG tablet Take 20 mg by mouth every morning.     glipiZIDE (GLUCOTROL) 10 MG tablet Take 10 mg by mouth every morning.     hydrocortisone cream 1 % Apply 1 Application topically in the morning and at bedtime. TO EXTREMITIES FOR ONE WEEK STARTED 11/05/22     hydrOXYzine (ATARAX) 10 MG tablet Take 1 tablet (10 mg total) by mouth 3 (three) times daily as needed for itching. 30 tablet 0   losartan (COZAAR) 50 MG tablet Take 1 tablet (50 mg total) by mouth daily.     magnesium hydroxide (MILK OF MAGNESIA) 400 MG/5ML suspension Take 30 mLs by mouth daily as needed for mild constipation.     metFORMIN (GLUCOPHAGE) 500 MG tablet Take 1 tablet (500 mg total) by mouth 2 (two) times daily with a meal. 60 tablet 0   mupirocin cream (BACTROBAN) 2 % Apply 1 Application topically 2 (two) times daily. 15 g 0   naloxone (NARCAN) nasal spray 4 mg/0.1 mL Place 1 spray into the nose See admin instructions. Every 2-3 minutesas needed until patient response/ emba arrived. 1 each 0   Oxycodone HCl 10 MG TABS Take 1 tablet (10 mg total) by mouth every 6 (six) hours as needed (pain). 15 tablet 0   pantoprazole (PROTONIX) 40 MG tablet Take 1 tablet (40 mg total) by mouth daily. 30 tablet 3   polyethylene glycol (MIRALAX / GLYCOLAX) 17 g packet Take 17 g by mouth daily. 14 each 0   pregabalin (LYRICA) 100 MG capsule Take 1 capsule (100 mg total) by mouth 2 (two) times daily. 60 capsule 0   Sodium Phosphates (RA SALINE ENEMA) 19-7 GM/118ML ENEM Place 1 Application rectally daily as needed (constipation).     TRULICITY 0.75 MG/0.5ML SOPN Inject into the skin.     No current facility-administered medications for this visit.     REVIEW OF SYSTEMS:  [X]  denotes positive finding, [ ]  denotes negative finding Cardiac  Comments:  Chest pain or chest pressure:    Shortness of breath upon exertion:    Short of breath when lying flat:    Irregular heart rhythm:        Vascular    Pain in calf, thigh, or hip brought on by ambulation:  Pain in feet at night that wakes you up from your sleep:     Blood clot in your veins:    Leg swelling:         Pulmonary    Oxygen at home:    Productive cough:     Wheezing:         Neurologic    Sudden weakness in arms or legs:     Sudden numbness in arms or legs:     Sudden onset of difficulty speaking or slurred speech:    Temporary loss of vision in one eye:     Problems with dizziness:         Gastrointestinal    Blood in stool:     Vomited blood:         Genitourinary    Burning when urinating:     Blood in urine:        Psychiatric    Major depression:         Hematologic    Bleeding problems:    Problems with blood clotting too easily:        Skin    Rashes or ulcers:        Constitutional    Fever or chills:      PHYSICAL EXAM: Vitals:   05/14/23 1413  BP: 129/76  Pulse: (!) 102  Resp: 18  Temp: 98.4 F (36.9 C)  TempSrc: Temporal  SpO2: 93%  Weight: 270 lb (122.5 kg)  Height: 6\' 2"  (1.88 m)    GENERAL: The patient is a well-nourished male, in no acute distress. The vital signs are documented above. CARDIAC: There is a regular rate and rhythm.  VASCULAR:  Bilateral femoral pulses palpable Weakly palpable right PT Left BKA PULMONARY: No respiratory distress. ABDOMEN: Soft and non-tender. MUSCULOSKELETAL: There are no major deformities or cyanosis. NEUROLOGIC: No focal weakness or paresthesias are detected. SKIN: Right foot tissue loss as pictured.    Nail bed wounds right 1st, 3rd, 4th toes as pictured  PSYCHIATRIC: The patient has a normal affect.       DATA:   ABIs previously 1.18 triphasic likely falsely elevated due  to his diabetes  Assessment/Plan:  62 y.o. male, with history of hypertension, diabetes, stroke that presents for evaluation of right lower extremity wounds.  Previously seen by Dr. Karin Lieu.  In the past Dr. Karin Lieu recommended conservative management as he felt he had normal ABIs with adequate toe pressure.  Unfortunately his nailbeds remain nonhealing now for over 3 months.  Dr. Sherral Hammers previously discussed if this was ongoing would recommend angiogram with possible intervention.  I have offered him an angiogram with a focus on the right leg with possible intervention.  He does have palpable femoral pulse as well as a weakly palpable posterior tibial pulse suspect if anything he has underlying tibial disease that would be treated with balloon angioplasty.  Risks benefits discussed.   Cephus Shelling, MD [facility] [place] Office: [phone]

## 2023-05-16 ENCOUNTER — Other Ambulatory Visit: Payer: Self-pay

## 2023-05-16 ENCOUNTER — Encounter (HOSPITAL_COMMUNITY)
Admission: RE | Disposition: A | Discharge status: Home / Self Care | Payer: Self-pay | Source: Ambulatory Visit | Attending: Vascular Surgery

## 2023-05-16 ENCOUNTER — Ambulatory Visit (HOSPITAL_COMMUNITY)
Admission: RE | Admit: 2023-05-16 | Discharge: 2023-05-16 | Disposition: A | Discharge status: Home / Self Care | Payer: Medicare Other | Source: Ambulatory Visit | Attending: Vascular Surgery | Admitting: Vascular Surgery

## 2023-05-16 DIAGNOSIS — Z539 Procedure and treatment not carried out, unspecified reason: Secondary | ICD-10-CM | POA: Insufficient documentation

## 2023-05-16 DIAGNOSIS — S91109A Unspecified open wound of unspecified toe(s) without damage to nail, initial encounter: Secondary | ICD-10-CM

## 2023-05-16 LAB — POCT I-STAT, CHEM 8
BUN: 34 mg/dL — ABNORMAL HIGH (ref 6–20)
Calcium, Ion: 1.13 mmol/L — ABNORMAL LOW (ref 1.15–1.40)
Chloride: 99 mmol/L (ref 98–111)
Creatinine, Ser: 2.5 mg/dL — ABNORMAL HIGH (ref 0.61–1.24)
Glucose, Bld: 127 mg/dL — ABNORMAL HIGH (ref 70–99)
HCT: 32 % — ABNORMAL LOW (ref 39.0–52.0)
Hemoglobin: 10.9 g/dL — ABNORMAL LOW (ref 13.0–17.0)
Potassium: 4.1 mmol/L (ref 3.5–5.1)
Sodium: 137 mmol/L (ref 135–145)
TCO2: 27 mmol/L (ref 22–32)

## 2023-05-16 LAB — GLUCOSE, CAPILLARY: Glucose-Capillary: 102 mg/dL — ABNORMAL HIGH (ref 70–99)

## 2023-05-16 SURGERY — ABDOMINAL AORTOGRAM W/LOWER EXTREMITY
Anesthesia: LOCAL

## 2023-05-16 MED ORDER — SODIUM CHLORIDE 0.9 % IV SOLN: INTRAVENOUS | Status: DC

## 2023-05-16 NOTE — Progress Notes (Signed)
Canceled procedure after ready per Dr Chestine Spore, creat elevated.

## 2023-05-16 NOTE — Progress Notes (Signed)
Patient was scheduled for right leg angiogram today for persistent tissue loss.  These are very superficial ulcers.  I suspect he has underlying tibial disease and will require contrast to evaluate - I don't think we can evaluate this with CO2 alone.  Unfortunately his creatinine today has increased from 1.2 in February to 2.5 today.  States he has been dehydrated in his facility.  I think it would be in his best interest to delay as I discussed with him as we would otherwise put him at increased risk for kidney insult from the contrast dye.  I will reschedule him in about 1 month.  I also will asked my office to place referral to nephrology just to complete an evaluation to ensure he does not need any further medical management.  Cephus Shelling, MD [facility] [place] Office: [phone]   Cephus Shelling

## 2023-05-23 ENCOUNTER — Other Ambulatory Visit: Payer: Self-pay

## 2023-05-23 DIAGNOSIS — S91301A Unspecified open wound, right foot, initial encounter: Secondary | ICD-10-CM

## 2023-06-08 ENCOUNTER — Emergency Department (HOSPITAL_COMMUNITY): Payer: Medicare Other

## 2023-06-08 ENCOUNTER — Other Ambulatory Visit: Payer: Self-pay

## 2023-06-08 ENCOUNTER — Encounter (HOSPITAL_COMMUNITY): Payer: Self-pay

## 2023-06-08 ENCOUNTER — Emergency Department (HOSPITAL_COMMUNITY)
Admission: EM | Admit: 2023-06-08 | Discharge: 2023-06-09 | Disposition: A | Discharge status: Home / Self Care | Payer: Medicare Other | Attending: Emergency Medicine | Admitting: Emergency Medicine

## 2023-06-08 DIAGNOSIS — K219 Gastro-esophageal reflux disease without esophagitis: Secondary | ICD-10-CM | POA: Diagnosis not present

## 2023-06-08 DIAGNOSIS — R3914 Feeling of incomplete bladder emptying: Secondary | ICD-10-CM | POA: Insufficient documentation

## 2023-06-08 DIAGNOSIS — Z794 Long term (current) use of insulin: Secondary | ICD-10-CM | POA: Diagnosis not present

## 2023-06-08 DIAGNOSIS — Z79899 Other long term (current) drug therapy: Secondary | ICD-10-CM | POA: Insufficient documentation

## 2023-06-08 DIAGNOSIS — Z7984 Long term (current) use of oral hypoglycemic drugs: Secondary | ICD-10-CM | POA: Insufficient documentation

## 2023-06-08 DIAGNOSIS — K5900 Constipation, unspecified: Secondary | ICD-10-CM | POA: Diagnosis not present

## 2023-06-08 DIAGNOSIS — R339 Retention of urine, unspecified: Secondary | ICD-10-CM

## 2023-06-08 DIAGNOSIS — E119 Type 2 diabetes mellitus without complications: Secondary | ICD-10-CM | POA: Insufficient documentation

## 2023-06-08 DIAGNOSIS — R109 Unspecified abdominal pain: Secondary | ICD-10-CM | POA: Diagnosis present

## 2023-06-08 LAB — COMPREHENSIVE METABOLIC PANEL
ALT: 20 U/L (ref 0–44)
AST: 26 U/L (ref 15–41)
Albumin: 3.1 g/dL — ABNORMAL LOW (ref 3.5–5.0)
Alkaline Phosphatase: 102 U/L (ref 38–126)
Anion gap: 7 (ref 5–15)
BUN: 18 mg/dL (ref 6–20)
CO2: 24 mmol/L (ref 22–32)
Calcium: 8 mg/dL — ABNORMAL LOW (ref 8.9–10.3)
Chloride: 105 mmol/L (ref 98–111)
Creatinine, Ser: 1.58 mg/dL — ABNORMAL HIGH (ref 0.61–1.24)
GFR, Estimated: 50 mL/min — ABNORMAL LOW (ref >60)
Glucose, Bld: 89 mg/dL (ref 70–99)
Potassium: 3.7 mmol/L (ref 3.5–5.1)
Sodium: 136 mmol/L (ref 135–145)
Total Bilirubin: 0.4 mg/dL (ref 0.3–1.2)
Total Protein: 6.5 g/dL (ref 6.5–8.1)

## 2023-06-08 LAB — CBC
HCT: 36.3 % — ABNORMAL LOW (ref 39.0–52.0)
Hemoglobin: 11.2 g/dL — ABNORMAL LOW (ref 13.0–17.0)
MCH: 26.2 pg (ref 26.0–34.0)
MCHC: 30.9 g/dL (ref 30.0–36.0)
MCV: 84.8 fL (ref 80.0–100.0)
Platelets: 274 10*3/uL (ref 150–400)
RBC: 4.28 MIL/uL (ref 4.22–5.81)
RDW: 15.4 % (ref 11.5–15.5)
WBC: 7 10*3/uL (ref 4.0–10.5)
nRBC: 0 % (ref 0.0–0.2)

## 2023-06-08 LAB — LIPASE, BLOOD: Lipase: 20 U/L (ref 11–51)

## 2023-06-08 MED ORDER — SODIUM CHLORIDE 0.9 % IV BOLUS
1000.0000 mL | Freq: Once | INTRAVENOUS | Status: AC
  Administered 2023-06-08: 1000 mL via INTRAVENOUS

## 2023-06-08 MED ORDER — HYDROMORPHONE HCL 1 MG/ML IJ SOLN
1.0000 mg | Freq: Once | INTRAMUSCULAR | Status: AC
  Administered 2023-06-08: 1 mg via INTRAVENOUS
  Filled 2023-06-08: qty 1

## 2023-06-08 MED ORDER — HALOPERIDOL LACTATE 5 MG/ML IJ SOLN
5.0000 mg | Freq: Once | INTRAMUSCULAR | Status: AC
  Administered 2023-06-08: 5 mg via INTRAVENOUS
  Filled 2023-06-08: qty 1

## 2023-06-08 MED ORDER — DIPHENHYDRAMINE HCL 50 MG/ML IJ SOLN
25.0000 mg | Freq: Once | INTRAMUSCULAR | Status: AC
  Administered 2023-06-08: 25 mg via INTRAVENOUS
  Filled 2023-06-08: qty 1

## 2023-06-08 NOTE — ED Provider Notes (Signed)
62 yo male brought in from [place], complaint of back pain. CT stone study ok. Pending CTA for dissection due to back pain. If negative, can be dc.  Physical Exam  BP 134/80 (BP Location: Right Arm)   Pulse 95   Temp (!) 96.4 F (35.8 C) (Axillary)   Resp 18   SpO2 96%   Physical Exam  Procedures  Procedures  ED Course / MDM   Clinical Course as of 06/08/23 2340  Sat Jun 08, 2023  2240 Temp(!): 96.4 F (35.8 C) axillary [AH]  2329 CT Renal [facility] [place] [AH]    Clinical Course User Index [AH] Arthor Captain, PA-C   Medical Decision Making Amount and/or Complexity of Data Reviewed Labs: ordered. Radiology: ordered. Decision-making details documented in ED Course.  Risk Prescription drug management.   CT with large stool burden, distended bladder. Agree with radiologist interpretation. Post void residual of almost . Offered I&O cath, enema, mag citrate, suppository, patient declines.  Consider cauda equina, patient denies loss of bowel or bladder control, saddle paresthesia, normal right leg strength, able to left leg leg (BKA).  Declined further pain medications as this complicates constipation. Patient to be dc back to facility.        Jeannie Fend, PA-C 06/09/23 1062    Zadie Rhine, MD 06/09/23 [phone]

## 2023-06-08 NOTE — ED Provider Notes (Signed)
[place] EMERGENCY DEPARTMENT AT [facility] Provider Note   CSN: [id number] Arrival date & time: 06/08/23  2048     History {Add pertinent medical, surgical, social history, OB history to HPI:1} Chief Complaint  Patient presents with  . Flank Pain    Brett White is a 62 y.o. male.  With a past medical history of community-acquired pneumonia, GERD, type 2 diabetes, neuropathy, history of osteomyelitis, previous stroke status post amputation left BKA currently scheduled for CT angiogram with runoff of the right lower extremity for chronic wounds.  Patient send in today for severe back pascin and urinary symptoms.  Patient complains of severe pain in his back.  History of back surgeries.  He also feels like he is having urgency to urinate.  He has never had pain like this before.  He has been vomiting.  He apparently became rude with staff at [facility] where he is currently living when they tried to catheterize him for urine.   Flank Pain      Home Medications Prior to Admission medications   Medication Sig Start Date End Date Taking? Authorizing Provider  acetaminophen (TYLENOL) 325 MG tablet Take 2 tablets (650 mg total) by mouth every 6 (six) hours as needed for mild pain (or Fever >/= 101). 06/13/22   Standley Brooking, MD  amLODipine (NORVASC) 10 MG tablet Take 10 mg by mouth every morning. 05/11/22   [provider]  atorvastatin (LIPITOR) 40 MG tablet Take 40 mg by mouth at bedtime. 11/07/21   [provider]  bisacodyl (DULCOLAX) 5 MG EC tablet Take 10 mg by mouth daily as needed (CONSTIPATION).    [provider]  cyclobenzaprine (FLEXERIL) 5 MG tablet Take 1 tablet (5 mg total) by mouth 3 (three) times daily as needed for muscle spasms. 06/13/22   Standley Brooking, MD  DULoxetine (CYMBALTA) 30 MG capsule Take 1 capsule (30 mg total) by mouth daily. Patient taking differently: Take 60 mg by mouth daily. 10/20/21   Ghimire, Werner Lean, MD   ergocalciferol (VITAMIN D2) 1.25 MG (50000 UT) capsule Take 50,000 Units by mouth once a week.    [provider]  famotidine (PEPCID) 20 MG tablet Take 20 mg by mouth daily. 04/20/23   [provider]  FARXIGA 10 MG TABS tablet Take 10 mg by mouth every morning. 02/07/22   [provider]  furosemide (LASIX) 20 MG tablet Take 20 mg by mouth every morning. 05/11/22   [provider]  glipiZIDE (GLUCOTROL) 10 MG tablet Take 10 mg by mouth every morning. 05/11/22   [provider]  hydrOXYzine (ATARAX) 10 MG tablet Take 1 tablet (10 mg total) by mouth 3 (three) times daily as needed for itching. Patient not taking: Reported on 05/15/2023 03/06/23   Adonis Huguenin, NP  insulin lispro (HUMALOG) 100 UNIT/ML KwikPen Inject 2-8 Units into the skin 3 (three) times daily with meals. Sliding scale 201-250=2 units 251-300=4 units 301-350=6 units 351-400= 8 units 401 or greater than=call [facility] 04/18/23   [provider]  losartan (COZAAR) 50 MG tablet Take 1 tablet (50 mg total) by mouth daily. 02/24/22   Marinda Elk, MD  naloxone [facility]) nasal spray 4 mg/0.1 mL Place 1 spray into the nose See admin instructions. Every 2-3 minutesas needed until patient response/ emba arrived. 11/18/22   Osvaldo Shipper, MD  Oxycodone HCl 10 MG TABS Take 1 tablet (10 mg total) by mouth every 6 (six) hours as needed (pain). Patient  taking differently: Take 10 mg by mouth every 4 (four) hours as needed (pain). 11/18/22   Osvaldo Shipper, MD  pantoprazole (PROTONIX) 40 MG tablet Take 1 tablet (40 mg total) by mouth daily. 10/20/21   Ghimire, Werner Lean, MD  polyethylene glycol (MIRALAX / GLYCOLAX) 17 g packet Take 17 g by mouth daily. Patient taking differently: Take 17 g by mouth daily. With 4 oz of fluids and drink 11/18/22   Osvaldo Shipper, MD  pregabalin (LYRICA) 100 MG capsule Take 1 capsule (100 mg total) by mouth 2 (two) times daily. Patient taking differently: Take  100 mg by mouth 4 (four) times daily. 11/18/22   Osvaldo Shipper, MD  sertraline (ZOLOFT) 25 MG tablet Take 25 mg by mouth daily. 04/30/23   [provider]  TRULICITY 1.5 MG/0.5ML SOPN Inject 1.5 mg into the skin once a week. 04/04/23   [provider]      Allergies    Patient has no known allergies.    Review of Systems   Review of Systems  Genitourinary:  Positive for flank pain.    Physical Exam Updated Vital Signs BP 134/80 (BP Location: Right Arm)   Pulse 95   Temp (!) 96.4 F (35.8 C) (Axillary)   Resp 18   SpO2 96%  Physical Exam Vitals and nursing note reviewed.  Constitutional:      General: He is not in acute distress.    Appearance: He is well-developed. He is not diaphoretic.     Comments: List patient laying face down on the bed vomiting into a blue bag.  No midline spinal tenderness, tenderness over the right lower back region.  Patient will not turn over for evaluation at this time  HENT:     Head: Normocephalic and atraumatic.  Eyes:     General: No scleral icterus.    Conjunctiva/sclera: Conjunctivae normal.  Cardiovascular:     Rate and Rhythm: Normal rate and regular rhythm.     Heart sounds: Normal heart sounds.  Pulmonary:     Effort: Pulmonary effort is normal. No respiratory distress.     Breath sounds: Normal breath sounds.  Abdominal:     Palpations: Abdomen is soft.     Tenderness: There is no abdominal tenderness.  Musculoskeletal:     Cervical back: Normal range of motion and neck supple.  Skin:    General: Skin is warm and dry.  Neurological:     Mental Status: He is alert.  Psychiatric:        Behavior: Behavior normal.    ED Results / Procedures / Treatments   Labs (all labs ordered are listed, but only abnormal results are displayed) Labs Reviewed  CBC  COMPREHENSIVE METABOLIC PANEL  LIPASE, BLOOD    EKG None  Radiology No results found.  Procedures Procedures  {Document cardiac monitor, telemetry  assessment procedure when appropriate:1}  Medications Ordered in ED Medications  haloperidol lactate (HALDOL) injection 5 mg (5 mg Intravenous Given 06/08/23 2117)  diphenhydrAMINE (BENADRYL) injection 25 mg (25 mg Intravenous Given 06/08/23 2117)  sodium chloride 0.9 % bolus 1,000 mL (1,000 mLs Intravenous New Bag/Given 06/08/23 2117)  HYDROmorphone (DILAUDID) injection 1 mg (1 mg Intravenous Given 06/08/23 2118)    ED Course/ Medical Decision Making/ A&P Clinical Course as of 06/10/23 2106  Sat Jun 08, 2023  2240 Temp(!): 96.4 F (35.8 C) axillary [AH]  2329 CT Renal Soundra [place] [AH]    Clinical Course User Index [AH] Arthor Captain, PA-C   {  Click here for ABCD2, HEART and other calculatorsREFRESH Note before signing :1}                              Medical Decision Making .ahmdm  Amount and/or Complexity of Data Reviewed Labs: ordered. Radiology: ordered. Decision-making details documented in ED Course.  Risk Prescription drug management.     {Document critical care time when appropriate:1} {Document review of labs and clinical decision tools ie heart score, Chads2Vasc2 etc:1}  {Document your independent review of radiology images, and any outside records:1} {Document your discussion with family members, caretakers, and with consultants:1} {Document social determinants of health affecting pt's care:1} {Document your decision making why or why not admission, treatments were needed:1} Final Clinical Impression(s) / ED Diagnoses Final diagnoses:  None    Rx / DC Orders ED Discharge Orders     None

## 2023-06-08 NOTE — ED Triage Notes (Addendum)
Pt to ED from [facility] with c/o R flank pain radiating to the thoracic lumbar region which began yesterday at 3am. Pt also endorses inability to urinate. No hx of kidney stonesThe facility attempted to do a foley on the pt, but pt refused and then threatened to throw feces on staff and walk around naked. Facility was unable to perform UA. Arrives A+O, VSS. Since 4:30 pm pt has received 10mg  Oxycodone, Lyrica, and 5mg  flexeril.

## 2023-06-09 ENCOUNTER — Emergency Department (HOSPITAL_COMMUNITY): Payer: Medicare Other

## 2023-06-09 MED ORDER — IOHEXOL 350 MG/ML SOLN
100.0000 mL | Freq: Once | INTRAVENOUS | Status: AC | PRN
  Administered 2023-06-09: 100 mL via INTRAVENOUS

## 2023-06-09 NOTE — Discharge Instructions (Signed)
Commend management of constipation which will hopefully improve your urinary retention.  Please return to the emergency room if you are unable to empty your bladder.  You have refused further care in the emergency room, declining management of your constipation and incomplete bladder emptying.  You may return at any time for further treatment.

## 2023-06-20 ENCOUNTER — Ambulatory Visit (HOSPITAL_COMMUNITY)
Admission: RE | Admit: 2023-06-20 | Discharge: 2023-06-20 | Disposition: A | Discharge status: Home / Self Care | Payer: Medicare Other | Source: Ambulatory Visit | Attending: Vascular Surgery | Admitting: Vascular Surgery

## 2023-06-20 ENCOUNTER — Other Ambulatory Visit: Payer: Self-pay

## 2023-06-20 ENCOUNTER — Encounter (HOSPITAL_COMMUNITY)
Admission: RE | Disposition: A | Discharge status: Home / Self Care | Payer: Self-pay | Source: Ambulatory Visit | Attending: Vascular Surgery

## 2023-06-20 ENCOUNTER — Encounter (HOSPITAL_COMMUNITY): Payer: Self-pay | Admitting: Vascular Surgery

## 2023-06-20 DIAGNOSIS — Z7984 Long term (current) use of oral hypoglycemic drugs: Secondary | ICD-10-CM | POA: Insufficient documentation

## 2023-06-20 DIAGNOSIS — Z89512 Acquired absence of left leg below knee: Secondary | ICD-10-CM | POA: Diagnosis not present

## 2023-06-20 DIAGNOSIS — E11621 Type 2 diabetes mellitus with foot ulcer: Secondary | ICD-10-CM | POA: Diagnosis present

## 2023-06-20 DIAGNOSIS — I10 Essential (primary) hypertension: Secondary | ICD-10-CM | POA: Diagnosis not present

## 2023-06-20 DIAGNOSIS — L97519 Non-pressure chronic ulcer of other part of right foot with unspecified severity: Secondary | ICD-10-CM | POA: Insufficient documentation

## 2023-06-20 DIAGNOSIS — I70235 Atherosclerosis of native arteries of right leg with ulceration of other part of foot: Secondary | ICD-10-CM

## 2023-06-20 DIAGNOSIS — Z7985 Long-term (current) use of injectable non-insulin antidiabetic drugs: Secondary | ICD-10-CM | POA: Insufficient documentation

## 2023-06-20 DIAGNOSIS — Z8673 Personal history of transient ischemic attack (TIA), and cerebral infarction without residual deficits: Secondary | ICD-10-CM | POA: Diagnosis not present

## 2023-06-20 DIAGNOSIS — S91301A Unspecified open wound, right foot, initial encounter: Secondary | ICD-10-CM

## 2023-06-20 DIAGNOSIS — E1140 Type 2 diabetes mellitus with diabetic neuropathy, unspecified: Secondary | ICD-10-CM | POA: Diagnosis not present

## 2023-06-20 HISTORY — PX: ABDOMINAL AORTOGRAM W/LOWER EXTREMITY: CATH118223

## 2023-06-20 LAB — GLUCOSE, CAPILLARY
Glucose-Capillary: 118 mg/dL — ABNORMAL HIGH (ref 70–99)
Glucose-Capillary: 125 mg/dL — ABNORMAL HIGH (ref 70–99)

## 2023-06-20 LAB — POCT I-STAT, CHEM 8
BUN: 26 mg/dL — ABNORMAL HIGH (ref 6–20)
Calcium, Ion: 0.93 mmol/L — ABNORMAL LOW (ref 1.15–1.40)
Chloride: 105 mmol/L (ref 98–111)
Creatinine, Ser: 1.8 mg/dL — ABNORMAL HIGH (ref 0.61–1.24)
Glucose, Bld: 122 mg/dL — ABNORMAL HIGH (ref 70–99)
HCT: 39 % (ref 39.0–52.0)
Hemoglobin: 13.3 g/dL (ref 13.0–17.0)
Potassium: 4.5 mmol/L (ref 3.5–5.1)
Sodium: 136 mmol/L (ref 135–145)
TCO2: 24 mmol/L (ref 22–32)

## 2023-06-20 SURGERY — ABDOMINAL AORTOGRAM W/LOWER EXTREMITY
Anesthesia: LOCAL

## 2023-06-20 MED ORDER — SODIUM CHLORIDE 0.9% FLUSH: 3.0000 mL | INTRAVENOUS | Status: DC | PRN

## 2023-06-20 MED ORDER — SODIUM CHLORIDE 0.9 % IV SOLN: INTRAVENOUS | Status: DC

## 2023-06-20 MED ORDER — IODIXANOL 320 MG/ML IV SOLN
INTRAVENOUS | Status: DC | PRN
  Administered 2023-06-20: 20 mL via INTRA_ARTERIAL

## 2023-06-20 MED ORDER — HEPARIN (PORCINE) IN NACL 1000-0.9 UT/500ML-% IV SOLN
INTRAVENOUS | Status: DC | PRN
  Administered 2023-06-20 (×2): 500 mL

## 2023-06-20 MED ORDER — MIDAZOLAM HCL 2 MG/2ML IJ SOLN
INTRAMUSCULAR | Status: AC
  Filled 2023-06-20: qty 2

## 2023-06-20 MED ORDER — FENTANYL CITRATE (PF) 100 MCG/2ML IJ SOLN
INTRAMUSCULAR | Status: DC | PRN
  Administered 2023-06-20 (×3): 25 ug via INTRAVENOUS

## 2023-06-20 MED ORDER — LIDOCAINE HCL (PF) 1 % IJ SOLN
INTRAMUSCULAR | Status: AC
  Filled 2023-06-20: qty 30

## 2023-06-20 MED ORDER — ACETAMINOPHEN 325 MG PO TABS: 650.0000 mg | ORAL_TABLET | ORAL | Status: DC | PRN

## 2023-06-20 MED ORDER — SODIUM CHLORIDE 0.9 % IV SOLN: 250.0000 mL | INTRAVENOUS | Status: DC | PRN

## 2023-06-20 MED ORDER — LABETALOL HCL 5 MG/ML IV SOLN: 10.0000 mg | INTRAVENOUS | Status: DC | PRN

## 2023-06-20 MED ORDER — SODIUM CHLORIDE 0.9% FLUSH: 3.0000 mL | Freq: Two times a day (BID) | INTRAVENOUS | Status: DC

## 2023-06-20 MED ORDER — HYDRALAZINE HCL 20 MG/ML IJ SOLN: 5.0000 mg | INTRAMUSCULAR | Status: DC | PRN

## 2023-06-20 MED ORDER — FENTANYL CITRATE (PF) 100 MCG/2ML IJ SOLN
INTRAMUSCULAR | Status: AC
  Filled 2023-06-20: qty 2

## 2023-06-20 MED ORDER — LIDOCAINE HCL (PF) 1 % IJ SOLN
INTRAMUSCULAR | Status: DC | PRN
  Administered 2023-06-20: 15 mL

## 2023-06-20 MED ORDER — ONDANSETRON HCL 4 MG/2ML IJ SOLN: 4.0000 mg | Freq: Four times a day (QID) | INTRAMUSCULAR | Status: DC | PRN

## 2023-06-20 MED ORDER — MIDAZOLAM HCL 2 MG/2ML IJ SOLN
INTRAMUSCULAR | Status: DC | PRN
  Administered 2023-06-20 (×3): 1 mg via INTRAVENOUS

## 2023-06-20 SURGICAL SUPPLY — 15 items
CATH OMNI FLUSH 5F 65CM (CATHETERS) IMPLANT
CATH TEMPO AQUA 5F 100CM (CATHETERS) IMPLANT
DEVICE CLOSURE MYNXGRIP 5F (Vascular Products) IMPLANT
GUIDEWIRE ANGLED .035X150CM (WIRE) IMPLANT
KIT ANGIASSIST CO2 SYSTEM (KITS) IMPLANT
KIT MICROPUNCTURE NIT STIFF (SHEATH) IMPLANT
PROTECTION STATION PRESSURIZED (MISCELLANEOUS) ×1
SET ATX-X65L (MISCELLANEOUS) IMPLANT
SET FLUSH CO2 (MISCELLANEOUS) IMPLANT
SHEATH PINNACLE 5F 10CM (SHEATH) IMPLANT
SHEATH PROBE COVER 6X72 (BAG) IMPLANT
STATION PROTECTION PRESSURIZED (MISCELLANEOUS) IMPLANT
TRAY PV CATH (CUSTOM PROCEDURE TRAY) ×1 IMPLANT
WIRE BENTSON .035X145CM (WIRE) IMPLANT
WIRE ROSEN-J .035X260CM (WIRE) IMPLANT

## 2023-06-20 NOTE — Progress Notes (Signed)
Patient was given discharge instructions. Instructions are also in his packet.

## 2023-06-20 NOTE — H&P (Signed)
Patient name: Brett White         MRN: [id number]        DOB: [birth year]          Sex: male   REASON FOR CONSULT: Known PAD   HPI: Brett White is a 62 y.o. male, with history of hypertension, diabetes, stroke that presents for evaluation of right lower extremity wounds.  Previously seen by Dr. Karin Lieu.  In the past Dr. Karin Lieu recommended conservative management as he felt he had normal ABIs with adequate toe pressure.  Ultimately he states since his toenails have fallen off he has nonhealing wound beds in the first third and fourth toe of the right foot.  He had a previous left BKA.  He walks with a walker.  Living at [facility] facility.  States they are doing diligent wound care.        Past Medical History:  Diagnosis Date   CAP (community acquired pneumonia) 12/09/2021   GERD (gastroesophageal reflux disease)     HTN (hypertension)     Insulin dependent type 2 diabetes mellitus (HCC)     Neuropathy     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   Stroke [facility])                 Past Surgical History:  Procedure Laterality Date   AMPUTATION Left 02/04/2021    Procedure: LEFT GREAT TOE AMPUTATION;  Surgeon: [name], MD;  Location: [facility] OR;  Service: Orthopedics;  Laterality: Left;   AMPUTATION Left 02/10/2021    Procedure: LEFT FOOT 1ST RAY AMPUTATION;  Surgeon: [name], MD;  Location: [facility] OR;  Service: Orthopedics;  Laterality: Left;   AMPUTATION Left 03/22/2021    Procedure: LEFT BELOW KNEE AMPUTATION;  Surgeon: [name], MD;  Location: [facility] OR;  Service: Orthopedics;  Laterality: Left;   APPENDECTOMY       BACK SURGERY       I & D EXTREMITY Left 03/03/2021    Procedure: LEFT FOOT DEBRIDEMENT;  Surgeon: [name], MD;  Location: [facility] OR;  Service: Orthopedics;  Laterality: Left;   INCISION AND DRAINAGE ABSCESS N/A 10/06/2021    Procedure: INCISION AND DRAINAGE BACK ABSCESS;  Surgeon: Berna Bue, MD;  Location: [facility] OR;  Service: General;   Laterality: N/A;   removal of back cyst       TONSILLECTOMY                   Family History  Problem Relation Age of Onset   Heart attack Neg Hx            SOCIAL HISTORY: Social History         Socioeconomic History   Marital status: Divorced      Spouse name: Not on file   Number of children: Not on file   Years of education: Not on file   Highest education level: Not on file  Occupational History   Not on file  Tobacco Use   Smoking status: Never   Smokeless tobacco: Never  Substance and Sexual Activity   Alcohol use: Not Currently   Drug use: Not Currently   Sexual activity: Not on file  Other Topics Concern   Not on file  Social History Narrative   Not on file    Social Determinants of Health        Financial Resource Strain: Not on file  Food Insecurity: No Food Insecurity (  11/16/2022)    Hunger Vital Sign     Worried About Running Out of Food in the Last Year: Never true     Ran Out of Food in the Last Year: Never true  Transportation Needs: No Transportation Needs (11/16/2022)    PRAPARE - Therapist, art (Medical): No     Lack of Transportation (Non-Medical): No  Physical Activity: Not on file  Stress: Not on file  Social Connections: Not on file  Intimate Partner Violence: Not At Risk (11/16/2022)    Humiliation, Afraid, Rape, and Kick questionnaire     Fear of Current or Ex-Partner: No     Emotionally Abused: No     Physically Abused: No     Sexually Abused: No      Allergies  No Known Allergies           Current Outpatient Medications  Medication Sig Dispense Refill   acetaminophen (TYLENOL) 325 MG tablet Take 2 tablets (650 mg total) by mouth every 6 (six) hours as needed for mild pain (or Fever >/= 101).       amLODipine (NORVASC) 10 MG tablet Take 10 mg by mouth every morning.       atorvastatin (LIPITOR) 20 MG tablet Take 20 mg by mouth at bedtime.       BISACODYL PO Take 10 mg by mouth daily as needed  (CONSTIPATION).       cyclobenzaprine (FLEXERIL) 5 MG tablet Take 1 tablet (5 mg total) by mouth 3 (three) times daily as needed for muscle spasms. 9 tablet 0   doxycycline (VIBRA-TABS) 100 MG tablet Take 1 tablet (100 mg total) by mouth 2 (two) times daily. 60 tablet 0   DULoxetine (CYMBALTA) 30 MG capsule Take 1 capsule (30 mg total) by mouth daily. 30 capsule 3   FARXIGA 10 MG TABS tablet Take 10 mg by mouth every morning.       furosemide (LASIX) 20 MG tablet Take 20 mg by mouth every morning.       glipiZIDE (GLUCOTROL) 10 MG tablet Take 10 mg by mouth every morning.       hydrocortisone cream 1 % Apply 1 Application topically in the morning and at bedtime. TO EXTREMITIES FOR ONE WEEK STARTED 11/05/22       hydrOXYzine (ATARAX) 10 MG tablet Take 1 tablet (10 mg total) by mouth 3 (three) times daily as needed for itching. 30 tablet 0   losartan (COZAAR) 50 MG tablet Take 1 tablet (50 mg total) by mouth daily.       magnesium hydroxide (MILK OF MAGNESIA) 400 MG/5ML suspension Take 30 mLs by mouth daily as needed for mild constipation.       metFORMIN (GLUCOPHAGE) 500 MG tablet Take 1 tablet (500 mg total) by mouth 2 (two) times daily with a meal. 60 tablet 0   mupirocin cream (BACTROBAN) 2 % Apply 1 Application topically 2 (two) times daily. 15 g 0   naloxone (NARCAN) nasal spray 4 mg/0.1 mL Place 1 spray into the nose See admin instructions. Every 2-3 minutesas needed until patient response/ emba arrived. 1 each 0   Oxycodone HCl 10 MG TABS Take 1 tablet (10 mg total) by mouth every 6 (six) hours as needed (pain). 15 tablet 0   pantoprazole (PROTONIX) 40 MG tablet Take 1 tablet (40 mg total) by mouth daily. 30 tablet 3   polyethylene glycol (MIRALAX / GLYCOLAX) 17 g packet Take 17 g by mouth daily.  14 each 0   pregabalin (LYRICA) 100 MG capsule Take 1 capsule (100 mg total) by mouth 2 (two) times daily. 60 capsule 0   Sodium Phosphates (RA SALINE ENEMA) 19-7 GM/118ML ENEM Place 1 Application  rectally daily as needed (constipation).       TRULICITY 0.75 MG/0.5ML SOPN Inject into the skin.          No current facility-administered medications for this visit.        REVIEW OF SYSTEMS:  [X]  denotes positive finding, [ ]  denotes negative finding Cardiac   Comments:  Chest pain or chest pressure:      Shortness of breath upon exertion:      Short of breath when lying flat:      Irregular heart rhythm:             Vascular      Pain in calf, thigh, or hip brought on by ambulation:      Pain in feet at night that wakes you up from your sleep:       Blood clot in your veins:      Leg swelling:              Pulmonary      Oxygen at home:      Productive cough:       Wheezing:              Neurologic      Sudden weakness in arms or legs:       Sudden numbness in arms or legs:       Sudden onset of difficulty speaking or slurred speech:      Temporary loss of vision in one eye:       Problems with dizziness:              Gastrointestinal      Blood in stool:       Vomited blood:              Genitourinary      Burning when urinating:       Blood in urine:             Psychiatric      Major depression:              Hematologic      Bleeding problems:      Problems with blood clotting too easily:             Skin      Rashes or ulcers:             Constitutional      Fever or chills:          PHYSICAL EXAM:    Vitals:    05/14/23 1413  BP: 129/76  Pulse: (!) 102  Resp: 18  Temp: 98.4 F (36.9 C)  TempSrc: Temporal  SpO2: 93%  Weight: 270 lb (122.5 kg)  Height: 6\' 2"  (1.88 m)      GENERAL: The patient is a well-nourished male, in no acute distress. The vital signs are documented above. CARDIAC: There is a regular rate and rhythm.  VASCULAR:  Bilateral femoral pulses palpable Weakly palpable right PT Left BKA PULMONARY: No respiratory distress. ABDOMEN: Soft and non-tender. MUSCULOSKELETAL: There are no major deformities or  cyanosis. NEUROLOGIC: No focal weakness or paresthesias are detected. SKIN: Right foot tissue loss as pictured.    Nail bed wounds right 1st, 3rd, 4th toes as  pictured  PSYCHIATRIC: The patient has a normal affect.           DATA:    ABIs previously 1.18 triphasic likely falsely elevated due to his diabetes   Assessment/Plan:   62 y.o. male, with history of hypertension, diabetes, stroke that presents for evaluation of right lower extremity wounds.  Previously seen by Dr. Karin Lieu.  In the past Dr. Karin Lieu recommended conservative management as he felt he had normal ABIs with adequate toe pressure.  Unfortunately his nailbeds remain nonhealing now for over 3 months.  Dr. Karin Lieu previously discussed if this was ongoing would recommend angiogram with possible intervention.  I have offered him an angiogram with a focus on the right leg with possible intervention.  He does have palpable femoral pulse as well as a weakly palpable posterior tibial pulse suspect if anything he has underlying tibial disease that would be treated with balloon angioplasty.  Risks benefits discussed.     Cephus Shelling, MD [facility] Office: [phone]

## 2023-06-20 NOTE — Op Note (Signed)
Patient name: Brett White MRN: [id number] DOB: [birth year] Sex: male  06/20/2023 Pre-operative Diagnosis: Right lower extremity wound Post-operative diagnosis:  Same Surgeon:  Cephus Shelling, MD Procedure Performed: 1.  Ultrasound-guided access left common femoral artery 2.  CO2 aortogram with catheter selection of aorta 3.  Right lower extremity arteriogram with catheter selection of right above-knee popliteal artery 4.  Mynx closure of the left common femoral artery 5.  39 minutes of monitored moderate conscious sedation time  Indications: Patient is a 62 year old male previously seen by Dr. Karin Lieu with nonhealing wound to his right lower extremity.  He presents for lower extremity angiogram with a focus on the right leg after risks benefits discussed.  Findings:   Ultrasound-guided access left common femoral artery.  CO2 aortogram showed a patent infrarenal aorta.  Renals were not visualized.  Both iliacs were widely patent.   On the right, he had a widely patent common femoral, profunda, SFA, above and below-knee popliteal artery.  He has tibial disease.  The anterior tibial has a moderate stenosis in the mid vessel and then occludes at the ankle with no filling of the dorsalis pedis.  The peroneal is patent but is very diminutive distally.  Posterior tibial is widely patent with inline flow into the foot with filling of the plantar arch.   Procedure:  The patient was identified in the holding area and taken to room 8.  The patient was then placed supine on the table and prepped and draped in the usual sterile fashion.  A time out was called.  Patient received Versed and fentanyl for conscious moderate sedation.  Vital signs were monitored including heart rate, respiratory rate, oxygenation and blood pressure.  I was present for all of moderate sedation.  Ultrasound was used to evaluate the left common femoral artery.  It was patent .  A digital ultrasound image was acquired.  A  micropuncture needle was used to access the left common femoral artery under ultrasound guidance.  An 018 wire was advanced without resistance and a micropuncture sheath was placed.  The 018 wire was removed and a benson wire was placed.  The micropuncture sheath was exchanged for a 5 french sheath.  An omniflush catheter was advanced over the wire to the level of L-1.  An abdominal angiogram was obtained with CO2.  Next, using the omniflush catheter and a benson wire, the aortic bifurcation was crossed and the catheter was placed into theright external iliac artery and right runoff was obtained with CO2.  Use CO2 given his chronic kidney disease.  We could not fully evaluate tibial vessels with CO2.  I did advance a catheter into the right above-knee popliteal artery for better tibial imaging.  We used some diluted contrast for tibial images.  Pertinent findings are noted above.  No intervention was performed.  He has inline flow through a widely patent posterior tibial.  Wires and catheters were removed.  Mynx closure of the port in the left groin.  Plan: Patient needs no intervention.  He has inline flow in the right lower extremity through a widely patent posterior tibial.  Will arrange follow-up in 4-6 weeks for wound check in office.   Cephus Shelling, MD [facility] [place] Office: [phone]

## 2023-07-09 NOTE — Progress Notes (Addendum)
[facility] - Clinic Note  07/17/2023   CHIEF COMPLAINT Patient presents for Retina Evaluation  HISTORY OF PRESENT ILLNESS: Brett White is a 62 y.o. male who presents to the clinic today for:  HPI     Retina Evaluation   In both eyes.  This started 2 weeks ago.  Duration of 2 weeks.  Associated Symptoms Flashes, Floaters, Distortion and Blind Spot.  Response to treatment was no improvement.  I, the attending physician,  performed the HPI with the patient and updated documentation appropriately.        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 Zenaida Niece. Pt states 2 weeks ago he woke up and had little vision in OD, only seeing light. Progressively he has developed a black spot in OS since then, 3/4 of vision. Seeing floaters and FOL on the right side. Says he's been diabetic for 20 years, last A1C he says was a 6 or an 8. Had a stroke 4 years ago, has affected his memory.        Last edited by Rennis Chris, MD on 07/17/2023 12:50 PM.     Patient states the vision started getting bad a about a week to 10 days ago. He started seeing floaters in the right eye and in the left eye started seeing a black circle 2 days ago.   Referring physician: Diona Foley, MD [address] [place],  [place] [address]  HISTORICAL INFORMATION:  Selected notes from the MEDICAL RECORD NUMBER Referred by Dr. Zenaida Niece for PDR w/ [facility] OU LEE:  Ocular Hx- PMH-   CURRENT MEDICATIONS: No current outpatient medications on file. (Ophthalmic Drugs)   No current facility-administered medications for this visit. (Ophthalmic Drugs)   Current Outpatient Medications (Other)  Medication Sig   acetaminophen (TYLENOL) 325 MG tablet Take 2 tablets (650 mg total) by mouth every 6 (six) hours as needed for mild pain (or Fever >/= 101). (Patient taking differently: Take 650 mg by mouth every 8 (eight) hours as needed for mild pain (pain score 1-3) (or Fever >/= 101).)   amLODipine (NORVASC)  10 MG tablet Take 10 mg by mouth every morning.   atorvastatin (LIPITOR) 40 MG tablet Take 40 mg by mouth at bedtime.   bisacodyl (DULCOLAX) 5 MG EC tablet Take 10 mg by mouth daily as needed (CONSTIPATION).   cyclobenzaprine (FLEXERIL) 5 MG tablet Take 1 tablet (5 mg total) by mouth 3 (three) times daily as needed for muscle spasms.   DULoxetine (CYMBALTA) 30 MG capsule Take 1 capsule (30 mg total) by mouth daily. (Patient taking differently: Take 60 mg by mouth daily.)   ergocalciferol (VITAMIN D2) 1.25 MG (50000 UT) capsule Take 50,000 Units by mouth once a week.   famotidine (PEPCID) 20 MG tablet Take 20 mg by mouth daily.   FARXIGA 10 MG TABS tablet Take 10 mg by mouth every morning. Dapaglifozin   furosemide (LASIX) 20 MG tablet Take 20 mg by mouth every morning.   glipiZIDE (GLUCOTROL) 10 MG tablet Take 10 mg by mouth every morning.   hydrOXYzine (ATARAX) 10 MG tablet Take 1 tablet (10 mg total) by mouth 3 (three) times daily as needed for itching.   insulin lispro (HUMALOG) 100 UNIT/ML KwikPen Inject 2-8 Units into the skin 3 (three) times daily with meals. Sliding scale 201-250=2 units 251-300=4 units 301-350=6 units 351-400= 8 units 401 or greater than=call [facility]   LINZESS 145 MCG CAPS capsule Take 145 mcg  by mouth daily.   losartan (COZAAR) 50 MG tablet Take 1 tablet (50 mg total) by mouth daily.   Melatonin 5 MG CAPS Take 5 mg by mouth at bedtime.   naloxone (NARCAN) nasal spray 4 mg/0.1 mL Place 1 spray into the nose See admin instructions. Every 2-3 minutesas needed until patient response/ emba arrived.   Oxycodone HCl 10 MG TABS Take 1 tablet (10 mg total) by mouth every 6 (six) hours as needed (pain). (Patient taking differently: Take 10 mg by mouth every 4 (four) hours as needed (pain).)   pantoprazole (PROTONIX) 40 MG tablet Take 1 tablet (40 mg total) by mouth daily.   polyethylene glycol (MIRALAX / GLYCOLAX) 17 g packet Take 17 g by mouth daily. (Patient taking differently:  Take 17 g by mouth daily. With 4 oz of fluids and drink)   pregabalin (LYRICA) 100 MG capsule Take 1 capsule (100 mg total) by mouth 2 (two) times daily. (Patient taking differently: Take 100 mg by mouth 4 (four) times daily.)   sertraline (ZOLOFT) 25 MG tablet Take 25 mg by mouth daily.   tamsulosin (FLOMAX) 0.4 MG CAPS capsule Take 0.4 mg by mouth daily.   traZODone (DESYREL) 50 MG tablet Take 50 mg by mouth at bedtime.   TRULICITY 1.5 MG/0.5ML SOPN Inject 1.5 mg into the skin once a week.   No current facility-administered medications for this visit. (Other)   REVIEW OF SYSTEMS: ROS   Positive for: Musculoskeletal, Endocrine, Cardiovascular, Eyes Negative for: Constitutional, Gastrointestinal, Neurological, Skin, Genitourinary, HENT, Respiratory, Psychiatric, Allergic/Imm, Heme/Lymph Last edited by Thompson Grayer, COT on 07/17/2023  8:26 AM.     ALLERGIES No Known Allergies PAST MEDICAL HISTORY Past Medical History:  Diagnosis Date   CAP (community acquired pneumonia) 12/09/2021   GERD (gastroesophageal reflux disease)    HTN (hypertension)    Insulin dependent type 2 diabetes mellitus (HCC)    Neuropathy    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   Stroke [facility])    Past Surgical History:  Procedure Laterality Date   ABDOMINAL AORTOGRAM W/LOWER EXTREMITY N/A 06/20/2023   Procedure: ABDOMINAL AORTOGRAM W/LOWER EXTREMITY;  Surgeon: Cephus Shelling, MD;  Location: [facility] INVASIVE CV LAB;  Service: Cardiovascular;  Laterality: N/A;   AMPUTATION Left 02/04/2021   Procedure: LEFT GREAT TOE AMPUTATION;  Surgeon: [name], MD;  Location: [facility] OR;  Service: Orthopedics;  Laterality: Left;   AMPUTATION Left 02/10/2021   Procedure: LEFT FOOT 1ST RAY AMPUTATION;  Surgeon: [name], MD;  Location: [facility] OR;  Service: Orthopedics;  Laterality: Left;   AMPUTATION Left 03/22/2021   Procedure: LEFT BELOW KNEE AMPUTATION;  Surgeon: [name], MD;  Location: [facility]  OR;  Service: Orthopedics;  Laterality: Left;   APPENDECTOMY     BACK SURGERY     I & D EXTREMITY Left 03/03/2021   Procedure: LEFT FOOT DEBRIDEMENT;  Surgeon: [name], MD;  Location: [facility] OR;  Service: Orthopedics;  Laterality: Left;   INCISION AND DRAINAGE ABSCESS N/A 10/06/2021   Procedure: INCISION AND DRAINAGE BACK ABSCESS;  Surgeon: Berna Bue, MD;  Location: [facility] OR;  Service: General;  Laterality: N/A;   removal of back cyst     TONSILLECTOMY     FAMILY HISTORY Family History  Problem Relation Age of Onset   Diabetes Mother    Heart attack Neg Hx    SOCIAL HISTORY Social History   Tobacco Use   Smoking status: Never  Smokeless tobacco: Never  Substance Use Topics   Alcohol use: Not Currently   Drug use: Not Currently       OPHTHALMIC EXAM:  Base Eye Exam     Visual Acuity (Snellen - Linear)       Right Left   Dist [place] LP HM   Dist ph  NI NI         Tonometry (Tonopen, 8:35 AM)       Right Left   Pressure 14 12         Pupils       Dark Light Shape React APD   Right 3 3 Round NR 0   Left 3 2 Round Minimal 0         Visual Fields       Left Right   Restrictions Total superior temporal, inferior temporal, superior nasal, inferior nasal deficiencies Total superior temporal, inferior temporal, superior nasal, inferior nasal deficiencies         Extraocular Movement       Right Left    Full, Ortho Full, Ortho         Neuro/Psych     Oriented x3: Yes   Mood/Affect: Normal         Dilation     Both eyes: 1.0% Mydriacyl, 2.5% Phenylephrine @ 8:36 AM           Slit Lamp and Fundus Exam     External Exam       Right Left   External Normal Normal         Slit Lamp Exam       Right Left   Lids/Lashes Dermatochalasis - upper lid Dermatochalasis - upper lid   Conjunctiva/Sclera White and quiet White and quiet   Cornea 1-2+ Guttata 1-2+ Guttata, Debris in tear film   Anterior Chamber Deep and quiet Deep and  quiet   Iris Round and dilated, No NVI Round and dilated, No NVI   Lens 2+ Nuclear sclerosis, 2+ Cortical cataract, 2-3+ Posterior subcapsular cataract 2+ Nuclear sclerosis, 2+ Cortical cataract, trace PSC   Anterior Vitreous Vitreous syneresis, Posterior vitreous detachment, blood stained Vitreous condensations Vitreous syneresis, blood stained Vitreous condensations         Fundus Exam       Right Left   Disc Pink and sharp, no NVD Pink and sharp, + mild NVD nasal disc   C/D Ratio 0.5 0.3   Macula Flat, Blunted foveal reflex, scattered MA/DBH, + central edema Flat, Blunted foveal reflex, scattered MA/DBH, central edema   Vessels Vascular attenuation, Tortuous Vascular attenuation, Tortuous   Periphery Attached, scattered MA/DBH, inferior obsured DBH 360 MA/DBH, focal traction RD IN quad--focal traction fibrosis along IN arcades, SRF extended IN disc           IMAGING AND PROCEDURES  Imaging and Procedures for 07/17/2023  OCT, Retina - OU - Both Eyes       Right Eye Quality was poor. Central Foveal Thickness: 674. Progression has no prior data. Findings include no SRF, abnormal foveal contour (+ central edema greatest temporal fovea and macula).   Left Eye Quality was good. Central Foveal Thickness: 294. Progression has no prior data. Findings include abnormal foveal contour, intraretinal hyper-reflective material (+ DME greatest superior and temporal fovea and macula, +SRF IN periphery and midzone extending to disc-- tractional RD).   Notes *Images captured and stored on drive  Diagnosis / Impression:  OD: + central edema greatest temporal fovea and macula  OS: + DME greatest superior and temporal fovea and macula, +SRF IN periphery and midzone extending to disc-- tractional RD  Clinical management:  See below  Abbreviations: NFP - Normal foveal profile. CME - cystoid macular edema. PED - pigment epithelial detachment. IRF - intraretinal fluid. SRF - subretinal fluid. EZ  - ellipsoid zone. ERM - epiretinal membrane. ORA - outer retinal atrophy. ORT - outer retinal tubulation. SRHM - subretinal hyper-reflective material. IRHM - intraretinal hyper-reflective material      Fluorescein Angiography Optos (Transit OS)       Right Eye Progression has no prior data. Early phase findings include blockage, microaneurysm, vascular perfusion defect. Mid/Late phase findings include blockage, microaneurysm, vascular perfusion defect (Diffuse blockage causing poor images, no obvious NV, scattered patches of vascular non perfusion).   Left Eye Progression has no prior data. Early phase findings include blockage, delayed filling, leakage, microaneurysm, retinal neovascularization, vascular perfusion defect (Focal patch of NVE IN midzone, scattered patches of vascular non perfusion greatest IN periphery ). Mid/Late phase findings include blockage, delayed filling, leakage, microaneurysm, retinal neovascularization, vascular perfusion defect (Focal patch of NVE IN midzone, scattered patches of vascular non perfusion greatest IN periphery).   Notes *Images captured and stored on drive  Diagnosis / Impression:  OD: Diffuse blockage causing poor images,  no obvious NV, scattered patches of vascular non perfusion OS: Focal patch of NVE IN midzone, scattered patches of vascular  non profusion greatest IN periphery   Clinical management:  See below  Abbreviations: NFP - Normal foveal profile. CME - cystoid macular edema. PED - pigment epithelial detachment. IRF - intraretinal fluid. SRF - subretinal fluid. EZ - ellipsoid zone. ERM - epiretinal membrane. ORA - outer retinal atrophy. ORT - outer retinal tubulation. SRHM - subretinal hyper-reflective material. IRHM - intraretinal hyper-reflective material      Intravitreal Injection, Pharmacologic Agent - OD - Right Eye       Time Out 07/17/2023. 10:52 AM. Confirmed correct patient, procedure, site, and patient consented.    Anesthesia Topical anesthesia was used. Anesthetic medications included Lidocaine 2%, Proparacaine 0.5%.   Procedure Preparation included 5% betadine to ocular surface, eyelid speculum. A (32g) needle was used.   Injection: 1.25 mg Bevacizumab 1.25mg /0.80ml   Route: Intravitreal, Site: Right Eye   NDC: [id number], Lot: [id number], Expiration date: 08/30/2023   Post-op Post injection exam found visual acuity of at least counting fingers. The patient tolerated the procedure well. There were no complications. The patient received written and verbal post procedure care education.           ASSESSMENT/PLAN:   ICD-10-CM   1. 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 Diabetes mellitus treated with oral medication (HCC)  E11.9    Z79.84     3. Encounter for long-term (current) use of insulin (HCC)  Z79.4     4. Diabetes mellitus treated with injections of non-insulin medication (HCC)  E11.9    Z79.85     5. Vitreous hemorrhage of both eyes (HCC)  H43.13 Intravitreal Injection, Pharmacologic Agent - OD - Right Eye    Bevacizumab (AVASTIN) SOLN 1.25 mg    6. Retinal detachment, tractional, left eye  H33.42     7.  Essential hypertension  I10     8. Hypertensive retinopathy of both eyes  H35.033     9. Combined forms of age-related cataract of both eyes  H25.813      1-5. 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, OU (OD > OS) - last A1C 10.8 (09.12.23) - The incidence, risk factors for progression, natural history and treatment options for diabetic retinopathy were discussed with patient.   - The need for close monitoring of blood glucose, blood pressure, and serum lipids, avoiding cigarette or any type of tobacco,  and the need for long term follow up was also discussed with patient. - BCVA OD LP, OS HM - exam shows diffuse VH OU; OS with TRD inf nasal periphery - OCT shows: OD: + central edema greatest temporal fovea and macula, OS: + DME greatest superior and temporal fovea and macula, +SRF IN periphery and midzone extending to disc-- tractional RD -- +DM OU - FA (10.16.24) today shows OD: Diffuse blockage causing poor images,  no obvious NV, scattered patches of vascular non perfusion; OS: Focal patch of NVE IN midzone, scattered patches of vascular  non profusion greatest IN periphery  - discussed findings and prognosis - recommend Avastin injection OU (OD #1 today and return Friday for injection OS) - pt wishes to proceed with Avastin injection  - RBA of procedure discussed, questions answered - IVA informed consent obtained and signed (OU) 10.16.24 - see procedure note - f/u in 2 days, DFE, OCT, possible injection OS and schedule for surgery  6. Traction retinal detachment, OS - focal traction RD IN quad--focal traction fibrosis along IN arcades, SRF extended IN disc - The incidence, risk factors, and natural history of retinal detachment was discussed with patient.   - Potential treatment options including delimiting laser, pneumatic retinopexy, scleral buckle, and vitrectomy, cryotherapy and laser, and the use of air, gas, and oil discussed with patient. - The risks of blindness, loss of vision, infection, hemorrhage, cataract progression or lens displacement were discussed with patient. - recommend 25g PPV/EL/Gas OS under general anesthesia - pt wishes to proceed with surgery - RBA of procedure discussed, questions answered - informed consent obtained and will sign at next appointment on Friday - tentatively planning for surgery on 10.24.24   7,8. Hypertensive retinopathy OU - discussed importance of tight BP control - monitor   9. Mixed Cataract OU, OD>OS - The symptoms of cataract,  surgical options, and treatments and risks were discussed with patient. - OD with large central PSC - discussed diagnosis and progression - under expert care of Dr. Zenaida Niece - monitor   Ophthalmic Meds Ordered this visit:  Meds ordered this encounter  Medications   Bevacizumab (AVASTIN) SOLN 1.25 mg     Return in about 2 days (around 07/19/2023) for f/u PDR , DFE, OCT, Possible, IVA, OS.  There are no Patient Instructions on file for this visit.  Explained the diagnoses, plan, and follow up with the patient and they expressed understanding.  Patient expressed understanding of the importance of proper follow up care.   This document serves as a record of services personally performed by Karie Chimera, MD, PhD. It was created on their behalf by De Blanch, an ophthalmic technician. The creation of this record is the provider's dictation and/or activities during the visit.    Electronically signed by: De Blanch, OA, 07/17/23  11:50 PM  This document serves as a record of services personally performed by Karie Chimera, MD, PhD. It was created  on their behalf by Charlette Caffey, COT an ophthalmic technician. The creation of this record is the provider's dictation and/or activities during the visit.    Electronically signed by:  Charlette Caffey, COT  07/17/23 11:50 PM  Karie Chimera, M.D., Ph.D. Diseases & Surgery of the Retina and Vitreous [facility] 07/17/2023  I have reviewed the above documentation for accuracy and completeness, and I agree with the above. Karie Chimera, M.D., Ph.D. 07/18/23 12:05 AM   Abbreviations: M myopia (nearsighted); A astigmatism; H hyperopia (farsighted); P presbyopia; Mrx spectacle prescription;  CTL contact lenses; OD right eye; OS left eye; OU both eyes  XT exotropia; ET esotropia; PEK punctate epithelial keratitis; PEE punctate epithelial erosions; DES dry eye syndrome; MGD meibomian gland dysfunction; ATs  artificial tears; PFAT's preservative free artificial tears; NSC nuclear sclerotic cataract; PSC posterior subcapsular cataract; ERM epi-retinal membrane; PVD posterior vitreous detachment; RD retinal detachment; DM diabetes mellitus; DR diabetic retinopathy; NPDR non-proliferative diabetic retinopathy; PDR proliferative diabetic retinopathy; CSME clinically significant macular edema; DME diabetic macular edema; dbh dot blot hemorrhages; CWS cotton wool spot; POAG primary open angle glaucoma; C/D cup-to-disc ratio; HVF humphrey visual field; GVF goldmann visual field; OCT optical coherence tomography; IOP intraocular pressure; BRVO Branch retinal vein occlusion; CRVO central retinal vein occlusion; CRAO central retinal artery occlusion; BRAO branch retinal artery occlusion; RT retinal tear; SB scleral buckle; PPV pars plana vitrectomy; VH Vitreous hemorrhage; PRP panretinal laser photocoagulation; IVK intravitreal kenalog; VMT vitreomacular traction; MH Macular hole;  NVD neovascularization of the disc; NVE neovascularization elsewhere; AREDS age related eye disease study; ARMD age related macular degeneration; POAG primary open angle glaucoma; EBMD epithelial/anterior basement membrane dystrophy; ACIOL anterior chamber intraocular lens; IOL intraocular lens; PCIOL posterior chamber intraocular lens; Phaco/IOL phacoemulsification with intraocular lens placement; PRK photorefractive keratectomy; LASIK laser assisted in situ keratomileusis; HTN hypertension; DM diabetes mellitus; COPD chronic obstructive pulmonary disease

## 2023-07-17 ENCOUNTER — Encounter (INDEPENDENT_AMBULATORY_CARE_PROVIDER_SITE_OTHER): Payer: Self-pay | Admitting: Ophthalmology

## 2023-07-17 ENCOUNTER — Ambulatory Visit (INDEPENDENT_AMBULATORY_CARE_PROVIDER_SITE_OTHER): Payer: Medicare Other | Admitting: Ophthalmology

## 2023-07-17 VITALS — BP 123/71 | HR 97

## 2023-07-17 DIAGNOSIS — E113513 Type 2 diabetes mellitus with proliferative diabetic retinopathy with macular edema, bilateral: Secondary | ICD-10-CM | POA: Diagnosis not present

## 2023-07-17 DIAGNOSIS — Z794 Long term (current) use of insulin: Secondary | ICD-10-CM

## 2023-07-17 DIAGNOSIS — I10 Essential (primary) hypertension: Secondary | ICD-10-CM

## 2023-07-17 DIAGNOSIS — E119 Type 2 diabetes mellitus without complications: Secondary | ICD-10-CM

## 2023-07-17 DIAGNOSIS — H4313 Vitreous hemorrhage, bilateral: Secondary | ICD-10-CM | POA: Diagnosis not present

## 2023-07-17 DIAGNOSIS — H3342 Traction detachment of retina, left eye: Secondary | ICD-10-CM | POA: Diagnosis not present

## 2023-07-17 DIAGNOSIS — Z7985 Long-term (current) use of injectable non-insulin antidiabetic drugs: Secondary | ICD-10-CM

## 2023-07-17 DIAGNOSIS — H25813 Combined forms of age-related cataract, bilateral: Secondary | ICD-10-CM

## 2023-07-17 DIAGNOSIS — H3581 Retinal edema: Secondary | ICD-10-CM

## 2023-07-17 DIAGNOSIS — H35033 Hypertensive retinopathy, bilateral: Secondary | ICD-10-CM

## 2023-07-17 DIAGNOSIS — Z7984 Long term (current) use of oral hypoglycemic drugs: Secondary | ICD-10-CM

## 2023-07-17 MED ORDER — BEVACIZUMAB CHEMO INJECTION 1.25MG/0.05ML SYRINGE FOR KALEIDOSCOPE
1.2500 mg | INTRAVITREAL | Status: AC | PRN
Start: 2023-07-17 — End: 2023-07-17
  Administered 2023-07-17: 1.25 mg via INTRAVITREAL

## 2023-07-18 NOTE — Progress Notes (Signed)
[facility] - Clinic Note  07/19/2023   CHIEF COMPLAINT Patient presents for Retina Follow Up  HISTORY OF PRESENT ILLNESS: Brett White is a 62 y.o. male who presents to the clinic today for:  HPI     Retina Follow Up   Patient presents with  Diabetic Retinopathy.  In both eyes.  This started 2 days ago.  I, the attending physician,  performed the HPI with the patient and updated documentation appropriately.        Comments   Patient here for 2 days retina follow up for PDR OU. Patient states vision not good. Light hurts OD bad. Keeps seeing webs and stuff  OS for 3 - 4 days.       Last edited by Rennis Chris, MD on 07/19/2023 12:39 PM.    Pt states he had no problems with the first Avastin injection, he states he talked to his facility and is ready to have sx next Thursday  . Referring physician: Diona Foley, MD [address] [place],  [place] [address]  HISTORICAL INFORMATION:  Selected notes from the MEDICAL RECORD NUMBER Referred by Dr. Zenaida Niece for PDR w/ [facility] OU LEE:  Ocular Hx- PMH-   CURRENT MEDICATIONS: No current outpatient medications on file. (Ophthalmic Drugs)   No current facility-administered medications for this visit. (Ophthalmic Drugs)   Current Outpatient Medications (Other)  Medication Sig   acetaminophen (TYLENOL) 325 MG tablet Take 2 tablets (650 mg total) by mouth every 6 (six) hours as needed for mild pain (or Fever >/= 101). (Patient taking differently: Take 650 mg by mouth every 8 (eight) hours as needed for mild pain (pain score 1-3) (or Fever >/= 101).)   amLODipine (NORVASC) 10 MG tablet Take 10 mg by mouth every morning.   atorvastatin (LIPITOR) 40 MG tablet Take 40 mg by mouth at bedtime.   bisacodyl (DULCOLAX) 5 MG EC tablet Take 10 mg by mouth daily as needed (CONSTIPATION).   cyclobenzaprine (FLEXERIL) 5 MG tablet Take 1 tablet (5 mg total) by mouth 3 (three) times daily as needed for muscle spasms.   DULoxetine  (CYMBALTA) 30 MG capsule Take 1 capsule (30 mg total) by mouth daily. (Patient taking differently: Take 60 mg by mouth daily.)   ergocalciferol (VITAMIN D2) 1.25 MG (50000 UT) capsule Take 50,000 Units by mouth once a week.   famotidine (PEPCID) 20 MG tablet Take 20 mg by mouth daily.   FARXIGA 10 MG TABS tablet Take 10 mg by mouth every morning. Dapaglifozin   furosemide (LASIX) 20 MG tablet Take 20 mg by mouth every morning.   glipiZIDE (GLUCOTROL) 10 MG tablet Take 10 mg by mouth every morning.   hydrOXYzine (ATARAX) 10 MG tablet Take 1 tablet (10 mg total) by mouth 3 (three) times daily as needed for itching.   insulin lispro (HUMALOG) 100 UNIT/ML KwikPen Inject 2-8 Units into the skin 3 (three) times daily with meals. Sliding scale 201-250=2 units 251-300=4 units 301-350=6 units 351-400= 8 units 401 or greater than=call [facility]   LINZESS 145 MCG CAPS capsule Take 145 mcg by mouth daily.   losartan (COZAAR) 50 MG tablet Take 1 tablet (50 mg total) by mouth daily.   Melatonin 5 MG CAPS Take 5 mg by mouth at bedtime.   naloxone (NARCAN) nasal spray 4 mg/0.1 mL Place 1 spray into the nose See admin instructions. Every 2-3 minutesas needed until patient response/ emba arrived.   Oxycodone HCl 10 MG TABS Take 1  tablet (10 mg total) by mouth every 6 (six) hours as needed (pain). (Patient taking differently: Take 10 mg by mouth every 4 (four) hours as needed (pain).)   pantoprazole (PROTONIX) 40 MG tablet Take 1 tablet (40 mg total) by mouth daily.   polyethylene glycol (MIRALAX / GLYCOLAX) 17 g packet Take 17 g by mouth daily. (Patient taking differently: Take 17 g by mouth daily. With 4 oz of fluids and drink)   pregabalin (LYRICA) 100 MG capsule Take 1 capsule (100 mg total) by mouth 2 (two) times daily. (Patient taking differently: Take 100 mg by mouth 4 (four) times daily.)   sertraline (ZOLOFT) 25 MG tablet Take 25 mg by mouth daily.   tamsulosin (FLOMAX) 0.4 MG CAPS capsule Take 0.4 mg by  mouth daily.   traZODone (DESYREL) 50 MG tablet Take 50 mg by mouth at bedtime.   TRULICITY 1.5 MG/0.5ML SOPN Inject 1.5 mg into the skin once a week.   No current facility-administered medications for this visit. (Other)   REVIEW OF SYSTEMS: ROS   Positive for: Musculoskeletal, Endocrine, Cardiovascular, Eyes Negative for: Constitutional, Gastrointestinal, Neurological, Skin, Genitourinary, HENT, Respiratory, Psychiatric, Allergic/Imm, Heme/Lymph Last edited by Laddie Aquas, COA on 07/19/2023  9:36 AM.      ALLERGIES No Known Allergies PAST MEDICAL HISTORY Past Medical History:  Diagnosis Date   CAP (community acquired pneumonia) 12/09/2021   GERD (gastroesophageal reflux disease)    HTN (hypertension)    Insulin dependent type 2 diabetes mellitus (HCC)    Neuropathy    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   Stroke [facility])    Past Surgical History:  Procedure Laterality Date   ABDOMINAL AORTOGRAM W/LOWER EXTREMITY N/A 06/20/2023   Procedure: ABDOMINAL AORTOGRAM W/LOWER EXTREMITY;  Surgeon: Cephus Shelling, MD;  [facility]: [facility] INVASIVE CV LAB;  Service: Cardiovascular;  Laterality: N/A;   AMPUTATION Left 02/04/2021   Procedure: LEFT GREAT TOE AMPUTATION;  Surgeon: [name], MD;  Location: [facility] OR;  Service: Orthopedics;  Laterality: Left;   AMPUTATION Left 02/10/2021   Procedure: LEFT FOOT 1ST RAY AMPUTATION;  Surgeon: [name], MD;  Location: [facility] OR;  Service: Orthopedics;  Laterality: Left;   AMPUTATION Left 03/22/2021   Procedure: LEFT BELOW KNEE AMPUTATION;  Surgeon: [name], MD;  Location[facility] OR;  Service: Orthopedics;  Laterality: Left;   APPENDECTOMY     BACK SURGERY     I & D EXTREMITY Left 03/03/2021   Procedure: LEFT FOOT DEBRIDEMENT;  Surgeon: [name], MD;  Location: [facility] OR;  Service: Orthopedics;  Laterality: Left;   INCISION AND DRAINAGE ABSCESS N/A 10/06/2021   Procedure: INCISION AND DRAINAGE BACK ABSCESS;  Surgeon:  Berna Bue, MD;  Location: [facility] OR;  Service: General;  Laterality: N/A;   removal of back cyst     TONSILLECTOMY     FAMILY HISTORY Family History  Problem Relation Age of Onset   Diabetes Mother    Heart attack Neg Hx    SOCIAL HISTORY Social History   Tobacco Use   Smoking status: Never   Smokeless tobacco: Never  Vaping Use   Vaping status: Never Used  Substance Use Topics   Alcohol use: Not Currently   Drug use: Not Currently       OPHTHALMIC EXAM:  Base Eye Exam     Visual Acuity (Snellen - Linear)       Right Left   Dist [place] LP 20/800   Dist  ph [place] NI NI         Tonometry (Tonopen, 9:33 AM)       Right Left   Pressure 20 15         Pupils       Dark Light Shape React APD   Right 3 3 Round NR None   Left 3 2 Round Minimal None         Visual Fields (Counting fingers)       Left Right   Restrictions Total superior temporal, inferior temporal, superior nasal, inferior nasal deficiencies Total superior temporal, inferior temporal, superior nasal, inferior nasal deficiencies         Extraocular Movement       Right Left    Full, Ortho Full, Ortho         Neuro/Psych     Oriented x3: Yes   Mood/Affect: Normal         Dilation     Both eyes: 1.0% Mydriacyl, 2.5% Phenylephrine @ 9:33 AM           Slit Lamp and Fundus Exam     External Exam       Right Left   External Normal Normal         Slit Lamp Exam       Right Left   Lids/Lashes Dermatochalasis - upper lid Dermatochalasis - upper lid   Conjunctiva/Sclera White and quiet White and quiet   Cornea 1-2+ Guttata 1-2+ Guttata, Debris in tear film   Anterior Chamber Deep and quiet Deep and quiet   Iris Round and dilated, No NVI Round and dilated, No NVI   Lens 2+ Nuclear sclerosis, 2+ Cortical cataract, 2-3+ Posterior subcapsular cataract 2+ Nuclear sclerosis, 2+ Cortical cataract, trace PSC   Anterior Vitreous Vitreous syneresis, Posterior vitreous detachment,  blood stained Vitreous condensations Vitreous syneresis, blood stained Vitreous condensations         Fundus Exam       Right Left   Disc Pink and sharp, no NVD Pink and sharp, +mild NVD nasal disc   C/D Ratio 0.5 0.3   Macula Flat, Blunted foveal reflex, scattered MA/DBH, + central edema Flat, Blunted foveal reflex, scattered MA/DBH, central edema   Vessels Vascular attenuation, Tortuous Vascular attenuation, Tortuous; +NVE   Periphery Attached, scattered MA/DBH, inferior retina obsured VH / blood clots 360 MA/DBH, focal tractional RD IN quad -- focal tractional fibrosis / NVE along IN arcades - SRF extends posteriorly to IN disc           IMAGING AND PROCEDURES  Imaging and Procedures for 07/19/2023  OCT, Retina - OU - Both Eyes       Right Eye Quality was poor. Progression has been stable. Findings include no SRF, abnormal foveal contour (+ central edema greatest temporal fovea and macula).   Left Eye Quality was good. Central Foveal Thickness: 294. Progression has been stable. Findings include abnormal foveal contour, intraretinal hyper-reflective material (+ DME greatest superior and temporal fovea and macula, +SRF IN periphery and midzone extending to disc-- tractional RD).   Notes *Images captured and stored on drive  Diagnosis / Impression:  OD: +central edema greatest temporal fovea and macula OS: +DME greatest superior and temporal fovea and macula, +SRF IN periphery and midzone extending to disc-- tractional RD  Clinical management:  See below  Abbreviations: NFP - Normal foveal profile. CME - cystoid macular edema. PED - pigment epithelial detachment. IRF - intraretinal fluid. SRF - subretinal fluid. EZ -  ellipsoid zone. ERM - epiretinal membrane. ORA - outer retinal atrophy. ORT - outer retinal tubulation. SRHM - subretinal hyper-reflective material. IRHM - intraretinal hyper-reflective material      Intravitreal Injection, Pharmacologic Agent - OS - Left  Eye       Time Out 07/19/2023. 11:03 AM. Confirmed correct patient, procedure, site, and patient consented.   Anesthesia Topical anesthesia was used. Anesthetic medications included Lidocaine 2%, Proparacaine 0.5%.   Procedure Preparation included 5% betadine to ocular surface, eyelid speculum. A supplied needle was used.   Injection: 1.25 mg Bevacizumab 1.25mg /0.65ml   Route: Intravitreal, Site: Left Eye   NDC: [id number], Lot: [id number], Expiration date: 09/07/2023   Post-op Post injection exam found visual acuity of at least counting fingers. The patient tolerated the procedure well. There were no complications. The patient received written and verbal post procedure care education.           ASSESSMENT/PLAN:   ICD-10-CM   1. 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 Diabetes mellitus treated with oral medication (HCC)  E11.9    Z79.84     3. Encounter for long-term (current) use of insulin (HCC)  Z79.4     4. Diabetes mellitus treated with injections of non-insulin medication (HCC)  E11.9    Z79.85     5. Vitreous hemorrhage of both eyes (HCC)  H43.13     6. Retinal detachment, tractional, left eye  H33.42     7. Essential hypertension  I10     8. Hypertensive retinopathy of both eyes  H35.033     9. Combined forms of age-related cataract of both eyes  H25.813      1-5. 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, OU (OD > OS) - last A1C 10.8 (09.12.23) - s/p IVA OD #1 (10.16.24) - BCVA OD stable at LP, OS improved to 20/800 from HM - exam shows diffuse VH OU; OS with TRD inf nasal periphery - OCT shows: OD: +central edema greatest temporal fovea and macula, OS: +DME greatest superior and temporal fovea and macula, +SRF IN periphery and midzone  extending to disc-- tractional RD -- +DM OU - FA (10.16.24) OD: Diffuse blockage causing poor images,  no obvious NV, scattered patches of vascular non perfusion; OS: Focal patch of NVE IN midzone, scattered patches of vascular  non profusion greatest IN periphery  - discussed findings and prognosis - recommend Avastin injection OS #1 today 10.18.24 - pt wishes to proceed with Avastin injection  - RBA of procedure discussed, questions answered - IVA informed consent obtained and signed (OU) 10.16.24 - see procedure note - f/u in 4 weeks, DFE, OCT, possible injection(s)  6. Traction retinal detachment, OS - focal traction RD IN quad--focal traction fibrosis along IN arcades, SRF extended IN disc - recommend 25g PPV/EL/Gas vs silicon oil OS under general anesthesia - pt wishes to proceed with surgery - RBA of procedure discussed, questions answered - surgery scheduled for 10.24.24, [facility] OR 8 11:30AM - f/u POD1 - Friday 10.25.24   7,8. Hypertensive retinopathy OU - discussed importance of tight BP control - monitor   9. 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 - OD with large central PSC -  discussed diagnosis and progression - under expert care of Dr. Zenaida Niece - monitor   Ophthalmic Meds Ordered this visit:  Meds ordered this encounter  Medications   Bevacizumab (AVASTIN) SOLN 1.25 mg     Return in about 1 week (around 07/26/2023) for POV PDR/TRD OS.  There are no Patient Instructions on file for this visit.  This document serves as a record of services personally performed by Karie Chimera, MD, PhD. It was created on their behalf by Berlin Hun COT, an ophthalmic technician. The creation of this record is the provider's dictation and/or activities during the visit.    Electronically signed by: Berlin Hun COT 10.17.24 9:54 PM  This document serves as a record of services personally  performed by Karie Chimera, MD, PhD. It was created on their behalf by Glee Arvin. Manson Passey, OA an ophthalmic technician. The creation of this record is the provider's dictation and/or activities during the visit.    Electronically signed by: Glee Arvin. Manson Passey, OA 07/21/23 9:54 PM  Karie Chimera, M.D., Ph.D. Diseases & Surgery of the Retina and Vitreous [facility] 07/19/2023   I have reviewed the above documentation for accuracy and completeness, and I agree with the above. Karie Chimera, M.D., Ph.D. 07/21/23 9:57 PM  Abbreviations: M myopia (nearsighted); A astigmatism; H hyperopia (farsighted); P presbyopia; Mrx spectacle prescription;  CTL contact lenses; OD right eye; OS left eye; OU both eyes  XT exotropia; ET esotropia; PEK punctate epithelial keratitis; PEE punctate epithelial erosions; DES dry eye syndrome; MGD meibomian gland dysfunction; ATs artificial tears; PFAT's preservative free artificial tears; NSC nuclear sclerotic cataract; PSC posterior subcapsular cataract; ERM epi-retinal membrane; PVD posterior vitreous detachment; RD retinal detachment; DM diabetes mellitus; DR diabetic retinopathy; NPDR non-proliferative diabetic retinopathy; PDR proliferative diabetic retinopathy; CSME clinically significant macular edema; DME diabetic macular edema; dbh dot blot hemorrhages; CWS cotton wool spot; POAG primary open angle glaucoma; C/D cup-to-disc ratio; HVF humphrey visual field; GVF goldmann visual field; OCT optical coherence tomography; IOP intraocular pressure; BRVO Branch retinal vein occlusion; CRVO central retinal vein occlusion; CRAO central retinal artery occlusion; BRAO branch retinal artery occlusion; RT retinal tear; SB scleral buckle; PPV pars plana vitrectomy; VH Vitreous hemorrhage; PRP panretinal laser photocoagulation; IVK intravitreal kenalog; VMT vitreomacular traction; MH Macular hole;  NVD neovascularization of the disc; NVE neovascularization  elsewhere; AREDS age related eye disease study; ARMD age related macular degeneration; POAG primary open angle glaucoma; EBMD epithelial/anterior basement membrane dystrophy; ACIOL anterior chamber intraocular lens; IOL intraocular lens; PCIOL posterior chamber intraocular lens; Phaco/IOL phacoemulsification with intraocular lens placement; PRK photorefractive keratectomy; LASIK laser assisted in situ keratomileusis; HTN hypertension; DM diabetes mellitus; COPD chronic obstructive pulmonary disease

## 2023-07-19 ENCOUNTER — Ambulatory Visit (INDEPENDENT_AMBULATORY_CARE_PROVIDER_SITE_OTHER): Payer: Medicare Other | Admitting: Ophthalmology

## 2023-07-19 ENCOUNTER — Encounter (INDEPENDENT_AMBULATORY_CARE_PROVIDER_SITE_OTHER): Payer: Self-pay | Admitting: Ophthalmology

## 2023-07-19 DIAGNOSIS — Z794 Long term (current) use of insulin: Secondary | ICD-10-CM | POA: Diagnosis not present

## 2023-07-19 DIAGNOSIS — H35033 Hypertensive retinopathy, bilateral: Secondary | ICD-10-CM

## 2023-07-19 DIAGNOSIS — E113513 Type 2 diabetes mellitus with proliferative diabetic retinopathy with macular edema, bilateral: Secondary | ICD-10-CM | POA: Diagnosis not present

## 2023-07-19 DIAGNOSIS — H25813 Combined forms of age-related cataract, bilateral: Secondary | ICD-10-CM

## 2023-07-19 DIAGNOSIS — Z7984 Long term (current) use of oral hypoglycemic drugs: Secondary | ICD-10-CM

## 2023-07-19 DIAGNOSIS — I10 Essential (primary) hypertension: Secondary | ICD-10-CM

## 2023-07-19 DIAGNOSIS — H3342 Traction detachment of retina, left eye: Secondary | ICD-10-CM | POA: Diagnosis not present

## 2023-07-19 DIAGNOSIS — E119 Type 2 diabetes mellitus without complications: Secondary | ICD-10-CM

## 2023-07-19 DIAGNOSIS — H4313 Vitreous hemorrhage, bilateral: Secondary | ICD-10-CM

## 2023-07-19 DIAGNOSIS — Z7985 Long-term (current) use of injectable non-insulin antidiabetic drugs: Secondary | ICD-10-CM

## 2023-07-19 MED ORDER — BEVACIZUMAB CHEMO INJECTION 1.25MG/0.05ML SYRINGE FOR KALEIDOSCOPE
1.2500 mg | INTRAVITREAL | Status: AC | PRN
Start: 2023-07-19 — End: 2023-07-19
  Administered 2023-07-19: 1.25 mg via INTRAVITREAL

## 2023-07-23 ENCOUNTER — Ambulatory Visit: Payer: Medicare Other

## 2023-07-23 NOTE — H&P (Signed)
Brett White is an 62 y.o. male.    Chief Complaint: tractional retinal detachment, LEFT EYE  HPI: Pt with history of severe proliferative diabetic retinopathy in both eyes presented with severely decreased vision in both eyes. On dilated exam was found to have a diffuse vitreous hemorrhage and a tractional retinal detachment in the left eye, affecting his activities of daily living. After a discussion of the risks, benefits and alternatives to surgery, the patient has elected to proceed with surgery to clear the vitreous hemorrhage and repair the tractional retinal detachment -- 25g PPV w/ membrane peel, endolaser, and gas vs oil, LEFT EYE, under general anesthesia.  Past Medical History:  Diagnosis Date   CAP (community acquired pneumonia) 12/09/2021   GERD (gastroesophageal reflux disease)    HTN (hypertension)    Insulin dependent type 2 diabetes mellitus (HCC)    Neuropathy    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   Stroke [facility])     Past Surgical History:  Procedure Laterality Date   ABDOMINAL AORTOGRAM W/LOWER EXTREMITY N/A 06/20/2023   Procedure: ABDOMINAL AORTOGRAM W/LOWER EXTREMITY;  Surgeon: Cephus Shelling, MD;  Location: [facility] INVASIVE CV LAB;  Service: Cardiovascular;  Laterality: N/A;   AMPUTATION Left 02/04/2021   Procedure: LEFT GREAT TOE AMPUTATION;  Surgeon: [name], MD;  Location: [facility] OR;  Service: Orthopedics;  Laterality: Left;   AMPUTATION Left 02/10/2021   Procedure: LEFT FOOT 1ST RAY AMPUTATION;  Surgeon: [name], MD;  Location: [facility] OR;  Service: Orthopedics;  Laterality: Left;   AMPUTATION Left 03/22/2021   Procedure: LEFT BELOW KNEE AMPUTATION;  Surgeon: [name], MD;  Location: [facility] OR;  Service: Orthopedics;  Laterality: Left;   APPENDECTOMY     BACK SURGERY     I & D EXTREMITY Left 03/03/2021   Procedure: LEFT FOOT DEBRIDEMENT;  Surgeon: [name], MD;  Location: [facility] OR;  Service: Orthopedics;  Laterality: Left;    INCISION AND DRAINAGE ABSCESS N/A 10/06/2021   Procedure: INCISION AND DRAINAGE BACK ABSCESS;  Surgeon: Berna Bue, MD;  Location: [facility] OR;  Service: General;  Laterality: N/A;   removal of back cyst     TONSILLECTOMY      Family History  Problem Relation Age of Onset   Diabetes Mother    Heart attack Neg Hx    Social History:  reports that he has never smoked. He has never used smokeless tobacco. He reports that he does not currently use alcohol. He reports that he does not currently use drugs.  Allergies: No Known Allergies  No medications prior to admission.    Review of systems otherwise negative  There were no vitals taken for this visit.  Physical exam: Mental status: oriented x3. Eyes: See eye exam associated with this date of surgery Ears, Nose, Throat: within normal limits Neck: Within Normal limits General: within normal limits Chest: Within normal limits Breast: deferred Heart: Within normal limits Abdomen: Within normal limits GU: deferred Extremities: within normal limits Skin: within normal limits  Assessment/Plan 1. Proliferative diabetic retinopathy with vitreous hemorrhage and tractional retinal detachment, LEFT EYE  Plan: To [facility] for 25g PPV w/ membrane peel, endolaser and gas vs oil, LEFT EYE, under general anesthesia - case scheduled for Thursday, 10.24.24, 1130 am, [facility] OR 08  Karie Chimera, M.D., Ph.D. Vitreoretinal Surgeon [facility]

## 2023-07-24 ENCOUNTER — Other Ambulatory Visit: Payer: Self-pay

## 2023-07-24 ENCOUNTER — Encounter (HOSPITAL_COMMUNITY): Payer: Self-pay | Admitting: Ophthalmology

## 2023-07-24 NOTE — Progress Notes (Addendum)
PCP - [facility] Cardiologist -   PPM/ICD -  Device Orders -  Rep Notified -   Chest x-ray - 2-15-*24 EKG - 11-15-22 Stress Test -  ECHO -  Cardiac Cath -   CPAP -   GLP-1 -TRULICITY  LAST DOSE: 07-18-23  Fasting Blood Sugar - around 170 per nurse Checks Blood Sugar TID FARXIGA Hold 72 hours prior to procedure  glipiZIDE (GLUCOTROL) None DOS   Blood Thinner Instructions:  Aspirin Instructions:   ERAS Protcol - Clear liquids until 8:30  COVID TEST- na  Anesthesia review: yes HX of HTN, CVA, DM  Patient verbally denies any shortness of breath, fever, cough and chest pain during phone call   -------------  SDW INSTRUCTIONS given:  Your procedure is scheduled on July 25, 2023.  Report to [facility] Main Entrance "A" at 9:00 A.M., and check in at the Admitting office.  Call this number if you have problems the morning of surgery:  [phone]   Remember:  Do not eat after midnight the night before your surgery  You may drink clear liquids until 8:30 the morning of your surgery.   Clear liquids allowed are: Water, Non-Citrus Juices (without pulp), Carbonated Beverages, Clear Tea, Black Coffee Only, and Gatorade    Take these medicines the morning of surgery with A SIP OF WATER  amLODipine (NORVASC)  DULoxetine (CYMBALTA)  famotidine (PEPCID  LINZESS  pantoprazole (PROTONIX)  pregabalin (LYRICA)  sertraline (ZOLOFT)   IF NEEDED acetaminophen (TYLENOL)  bisacodyl (DULCOLAX)  cyclobenzaprine (FLEXERIL)  oxyCODONE-acetaminophen (PERCOCET)  naloxone (NARCAN)    WHAT DO I DO ABOUT MY DIABETES MEDICATION?   Do not take oral diabetes medicines (pills) the morning of surgery.  FARXIGA LAST DOSE   glipiZIDE (GLUCOTROL)   THE NIGHT BEFORE SURGERY, take _0units of insulin lispro (HUMALOG) insulin.       THE MORNING OF SURGERY, take 0 units of _insulin lispro (HUMALOG) .  The day of surgery, do not take other diabetes injectables, including  Byetta (exenatide), Bydureon (exenatide ER), Victoza (liraglutide), or Trulicity (dulaglutide). TRULICITY LAST DOSE 07-18-23  If your CBG is greater than 220 mg/dL, you may take  of your sliding scale (correction) dose of insulin morning of sugery   HOW TO MANAGE YOUR DIABETES BEFORE AND AFTER SURGERY  Why is it important to control my blood sugar before and after surgery? Improving blood sugar levels before and after surgery helps healing and can limit problems. A way of improving blood sugar control is eating a healthy diet by:  Eating less sugar and carbohydrates  Increasing activity/exercise  Talking with your doctor about reaching your blood sugar goals High blood sugars (greater than 180 mg/dL) can raise your risk of infections and slow your recovery, so you will need to focus on controlling your diabetes during the weeks before surgery. Make sure that the doctor who takes care of your diabetes knows about your planned surgery including the date and location.  How do I manage my blood sugar before surgery? Check your blood sugar at least 4 times a day, starting 2 days before surgery, to make sure that the level is not too high or low.  Check your blood sugar the morning of your surgery when you wake up and every 2 hours until you get to the Short Stay unit.  If your blood sugar is less than 70 mg/dL, you will need to treat for low blood sugar: Do not take insulin. Treat a low blood sugar (  less than 70 mg/dL) with  cup of clear juice (cranberry or apple), 4 glucose tablets, OR glucose gel. Recheck blood sugar in 15 minutes after treatment (to make sure it is greater than 70 mg/dL). If your blood sugar is not greater than 70 mg/dL on recheck, call [phone] for further instructions. Report your blood sugar to the short stay nurse when you get to Short Stay.  If you are admitted to the hospital after surgery: Your blood sugar will be checked by the staff and you will probably be  given insulin after surgery (instead of oral diabetes medicines) to make sure you have good blood sugar levels. The goal for blood sugar control after surgery is 80-180 mg/dL.  As of today, STOP taking any Aspirin (unless otherwise instructed by your surgeon) Aleve, Naproxen, Ibuprofen, Motrin, Advil, Goody's, BC's, all herbal medications, fish oil, and all vitamins.                      Do not wear jewelry, make up, or nail polish            Do not wear lotions, powders, perfumes/colognes, or deodorant.            Do not shave 48 hours prior to surgery.  Men may shave face and neck.            Do not bring valuables to the hospital.            [facility] is not responsible for any belongings or valuables.  Do NOT Smoke (Tobacco/Vaping) 24 hours prior to your procedure If you use a CPAP at night, you may bring all equipment for your overnight stay.   Contacts, glasses, dentures or bridgework may not be worn into surgery.      For patients admitted to the hospital, discharge time will be determined by your treatment team.   Patients discharged the day of surgery will not be allowed to drive home, and someone needs to stay with them for 24 hours.    Special instructions:   [place]- Preparing For Surgery  Before surgery, you can play an important role. Because skin is not sterile, your skin needs to be as free of germs as possible. You can reduce the number of germs on your skin by washing with CHG (chlorahexidine gluconate) Soap before surgery.  CHG is an antiseptic cleaner which kills germs and bonds with the skin to continue killing germs even after washing.    Oral Hygiene is also important to reduce your risk of infection.  Remember - BRUSH YOUR TEETH THE MORNING OF SURGERY WITH YOUR REGULAR TOOTHPASTE  Please do not use if you have an allergy to CHG or antibacterial soaps. If your skin becomes reddened/irritated stop using the CHG.  Do not shave (including legs and underarms)  for at least 48 hours prior to first CHG shower. It is OK to shave your face.  Please follow these instructions carefully.   Shower the NIGHT BEFORE SURGERY and the MORNING OF SURGERY with DIAL Soap.   Pat yourself dry with a CLEAN TOWEL.  Wear CLEAN PAJAMAS to bed the night before surgery  Place CLEAN SHEETS on your bed the night of your first shower and DO NOT SLEEP WITH PETS.   Day of Surgery: Please shower morning of surgery  Wear Clean/Comfortable clothing the morning of surgery Do not apply any deodorants/lotions.   Remember to brush your teeth WITH YOUR REGULAR TOOTHPASTE.  Questions were answered. Patient verbalized understanding of instructions.

## 2023-07-24 NOTE — Progress Notes (Signed)
Anesthesia Chart Review:  Case: [id number] Date/Time: 07/25/23 1115   Procedures:      PARS PLANA VITRECTOMY WITH 25 GAUGE (Left)     MEMBRANE PEEL (Left)   Anesthesia type: General   Pre-op diagnosis: tractional retinal detachment, left eye   [facility] 08 / [facility]   Surgeons: Rennis Chris, MD       DISCUSSION: Patient is a 62 year old male scheduled for the above procedure. Case is posted for general anesthesia.   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   Last Trulicity dose reported as 07/18/23. To hold Fargixa until after surgery. A1c 06/2022 10.8%, but no recent results available. [facility] staff said fasting CBGs ~ 170's.   He has a same-day work.  Anesthesia team to evaluate on the day of surgery.    VS:  Wt Readings from Last 3 Encounters:  06/20/23 120.7 kg  05/16/23 120.7 kg  05/14/23 122.5 kg   BP Readings from Last 3 Encounters:  07/17/23 123/71  06/20/23 (!) 150/84  06/09/23 118/79   Pulse Readings from Last 3 Encounters:  07/17/23 97  06/20/23 87  06/09/23 79     PROVIDERS: [facility]   LABS: For day of surgery as indicated. Last results in [facility] include: Lab Results  Component Value Date   WBC 7.0 06/08/2023   HGB 13.3 06/20/2023   HCT 39.0 06/20/2023   PLT 274 06/08/2023   GLUCOSE 122 (H) 06/20/2023   ALT 20 06/08/2023   AST 26 06/08/2023   NA 136 06/20/2023   K 4.5 06/20/2023   CL 105 06/20/2023   CREATININE 1.80 (H) 06/20/2023   BUN 26 (H) 06/20/2023   CO2 24 06/08/2023   INR 1.0 02/16/2022   HGBA1C 10.8 (H) 06/12/2022     IMAGES: CTA Chest/abd/pelvis 06/09/23: IMPRESSION: 1. No evidence for aortic dissection or aneurysm. 2. Minimal peripheral bands of opacity in the left upper lobe,  new from prior. Findings may be infectious/inflammatory. 3. Nonobstructing right renal calculi. 4. Distended bladder. 5. Large amount of stool throughout the colon. 6. Left Bosniak I benign renal cyst measuring 2.5 cm. No follow-up imaging is recommended. JACR 2018 Feb; 264-273, Management of the Incidental Renal Mass on CT, [employer] 2021; 814-848, Bosniak Classification of Cystic Renal Masses, Version 2019.        EKG: 11/15/22: Sinus tachycardia at 105 bpm Otherwise normal ECG When compared with ECG of 11-Jun-2022 07:07, PREVIOUS ECG IS PRESENT No acute changes No significant change since last tracing Confirmed by Derwood Kaplan [phone]) on 11/15/2022 9:52:05 PM   CV: CO2 Aortogram and RLE arteriogram 06/20/23: Findings:  - Ultrasound-guided access left common femoral artery.  CO2 aortogram showed a patent infrarenal aorta.  Renals were not visualized.  Both iliacs were widely patent.  - On the right, he had a widely patent common femoral, profunda, SFA, above and below-knee popliteal artery.  He has tibial disease.  The anterior tibial has a moderate stenosis in the mid vessel and then occludes at the ankle with no filling of the dorsalis pedis.  The peroneal is patent but is very diminutive distally.  Posterior tibial is widely patent with inline flow into the foot with filling of the plantar arch. Plan:  Patient needs no intervention. He has inline flow in the right  lower extremity through a widely patent posterior tibial. Will arrange follow-up in 4-6 weeks for wound check in office.    Past Medical History:  Diagnosis Date   CAP (community acquired pneumonia) 12/09/2021   CKD (chronic kidney disease)    GERD (gastroesophageal reflux disease)    HTN (hypertension)    Insulin dependent type 2 diabetes mellitus (HCC)    Neuropathy    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   Stroke [facility])     Past Surgical History:  Procedure Laterality Date    ABDOMINAL AORTOGRAM W/LOWER EXTREMITY N/A 06/20/2023   Procedure: ABDOMINAL AORTOGRAM W/LOWER EXTREMITY;  Surgeon: Cephus Shelling, MD;  [facility] INVASIVE CV [facility];  Service: Cardiovascular;  Laterality: N/A;   AMPUTATION Left 02/04/2021   Procedure: LEFT GREAT TOE AMPUTATION;  Surgeon: [name], MD;  Location: [facility] OR;  Service: Orthopedics;  Laterality: Left;   AMPUTATION Left 02/10/2021   Procedure: LEFT FOOT 1ST RAY AMPUTATION;  Surgeon: [name], MD;  Location: [facility] OR;  Service: Orthopedics;  Laterality: Left;   AMPUTATION Left 03/22/2021   Procedure: LEFT BELOW KNEE AMPUTATION;  Surgeon: [name], MD;  Location: [facility] OR;  Service: Orthopedics;  Laterality: Left;   APPENDECTOMY     BACK SURGERY     I & D EXTREMITY Left 03/03/2021   Procedure: LEFT FOOT DEBRIDEMENT;  Surgeon: [name], MD;  Location: [facility] OR;  Service: Orthopedics;  Laterality: Left;   INCISION AND DRAINAGE ABSCESS N/A 10/06/2021   Procedure: INCISION AND DRAINAGE BACK ABSCESS;  Surgeon: Berna Bue, MD;  Location[facility] OR;  Service: General;  Laterality: N/A;   removal of back cyst     TONSILLECTOMY      MEDICATIONS: No current facility-administered medications for this encounter.    acetaminophen (TYLENOL) 325 MG tablet   amLODipine (NORVASC) 10 MG tablet   atorvastatin (LIPITOR) 40 MG tablet   bisacodyl (DULCOLAX) 5 MG EC tablet   cyclobenzaprine (FLEXERIL) 5 MG tablet   DULoxetine (CYMBALTA) 30 MG capsule   ergocalciferol (VITAMIN D2) 1.25 MG (50000 UT) capsule   famotidine (PEPCID) 20 MG tablet   FARXIGA 10 MG TABS tablet   furosemide (LASIX) 20 MG tablet   glipiZIDE (GLUCOTROL) 10 MG tablet   hydrocortisone cream 1 %   insulin lispro (HUMALOG) 100 UNIT/ML KwikPen   LINZESS 145 MCG CAPS capsule   losartan (COZAAR) 50 MG tablet   magnesium hydroxide (MILK OF MAGNESIA) 400 MG/5ML suspension   Melatonin 5 MG CAPS   naloxone (NARCAN) nasal spray 4 mg/0.1 mL    oxyCODONE-acetaminophen (PERCOCET) 10-325 MG tablet   pantoprazole (PROTONIX) 40 MG tablet   polyethylene glycol (MIRALAX / GLYCOLAX) 17 g packet   Polyethylene Glycol 3350 POWD   pregabalin (LYRICA) 100 MG capsule   sertraline (ZOLOFT) 50 MG tablet   SODIUM PHOSPHATES RE   traZODone (DESYREL) 150 MG tablet   TRULICITY 1.5 MG/0.5ML SOPN   hydrOXYzine (ATARAX) 10 MG tablet    Shonna Chock, PA-C Surgical Short Stay/Anesthesiology [facility] Phone [phone] [facility] Phone [phone] 07/24/2023 12:19 PM

## 2023-07-24 NOTE — Anesthesia Preprocedure Evaluation (Signed)
Anesthesia Evaluation  Patient identified by MRN, date of birth, ID band Patient awake    Reviewed: Allergy & Precautions, NPO status , Patient's Chart, lab work & pertinent test results, reviewed documented beta blocker date and time   Airway Mallampati: II  TM Distance: >3 FB Neck ROM: Full    Dental  (+) Dental Advisory Given, Poor Dentition, Loose, Missing, Chipped   Pulmonary pneumonia, resolved   Pulmonary exam normal breath sounds clear to auscultation       Cardiovascular hypertension, Pt. on medications and Pt. on home beta blockers Normal cardiovascular exam Rhythm:Regular Rate:Normal     Neuro/Psych  PSYCHIATRIC DISORDERS      Peripheral neuropathy  Neuromuscular disease CVA    GI/Hepatic Neg liver ROS,GERD  Medicated,,  Endo/Other  diabetes, Well Controlled, Type 2, Insulin Dependent  GLP-1 RA therapy - last dose  Renal/GU Renal InsufficiencyRenal disease  negative genitourinary   Musculoskeletal S/P left BKA   Abdominal   Peds  Hematology  (+) Blood dyscrasia, anemia   Anesthesia Other Findings   Reproductive/Obstetrics                             Anesthesia Physical Anesthesia Plan  ASA: 3  Anesthesia Plan: General   Post-op Pain Management:    Induction: Intravenous  PONV Risk Score and Plan: 3 and Treatment may vary due to age or medical condition and Ondansetron  Airway Management Planned: Oral ETT  Additional Equipment:   Intra-op Plan:   Post-operative Plan: Extubation in OR  Informed Consent: I have reviewed the patients History and Physical, chart, labs and discussed the procedure including the risks, benefits and alternatives for the proposed anesthesia with the patient or authorized representative who has indicated his/her understanding and acceptance.     Dental advisory given  Plan Discussed with: CRNA and Anesthesiologist  Anesthesia Plan  Comments: (PAT note written 07/24/2023 by Shonna Chock, PA-C.  )        Anesthesia Quick Evaluation

## 2023-07-25 ENCOUNTER — Encounter (HOSPITAL_COMMUNITY)
Admission: RE | Disposition: A | Discharge status: Home / Self Care | Payer: Self-pay | Source: Home / Self Care | Attending: Ophthalmology

## 2023-07-25 ENCOUNTER — Ambulatory Visit (HOSPITAL_BASED_OUTPATIENT_CLINIC_OR_DEPARTMENT_OTHER): Payer: Medicare Other | Admitting: Vascular Surgery

## 2023-07-25 ENCOUNTER — Ambulatory Visit (HOSPITAL_COMMUNITY)
Admission: RE | Admit: 2023-07-25 | Discharge: 2023-07-25 | Disposition: A | Discharge status: Home / Self Care | Payer: Medicare Other | Attending: Ophthalmology | Admitting: Ophthalmology

## 2023-07-25 ENCOUNTER — Ambulatory Visit (HOSPITAL_COMMUNITY): Payer: Medicare Other | Admitting: Vascular Surgery

## 2023-07-25 DIAGNOSIS — E113542 Type 2 diabetes mellitus with proliferative diabetic retinopathy with combined traction retinal detachment and rhegmatogenous retinal detachment, left eye: Secondary | ICD-10-CM | POA: Diagnosis not present

## 2023-07-25 DIAGNOSIS — Z89512 Acquired absence of left leg below knee: Secondary | ICD-10-CM | POA: Diagnosis not present

## 2023-07-25 DIAGNOSIS — K219 Gastro-esophageal reflux disease without esophagitis: Secondary | ICD-10-CM | POA: Insufficient documentation

## 2023-07-25 DIAGNOSIS — Z8673 Personal history of transient ischemic attack (TIA), and cerebral infarction without residual deficits: Secondary | ICD-10-CM | POA: Diagnosis not present

## 2023-07-25 DIAGNOSIS — I129 Hypertensive chronic kidney disease with stage 1 through stage 4 chronic kidney disease, or unspecified chronic kidney disease: Secondary | ICD-10-CM

## 2023-07-25 DIAGNOSIS — H4312 Vitreous hemorrhage, left eye: Secondary | ICD-10-CM | POA: Diagnosis not present

## 2023-07-25 DIAGNOSIS — N189 Chronic kidney disease, unspecified: Secondary | ICD-10-CM | POA: Insufficient documentation

## 2023-07-25 DIAGNOSIS — Z7985 Long-term (current) use of injectable non-insulin antidiabetic drugs: Secondary | ICD-10-CM | POA: Insufficient documentation

## 2023-07-25 DIAGNOSIS — E1122 Type 2 diabetes mellitus with diabetic chronic kidney disease: Secondary | ICD-10-CM | POA: Insufficient documentation

## 2023-07-25 DIAGNOSIS — H3342 Traction detachment of retina, left eye: Secondary | ICD-10-CM | POA: Diagnosis not present

## 2023-07-25 DIAGNOSIS — E113512 Type 2 diabetes mellitus with proliferative diabetic retinopathy with macular edema, left eye: Secondary | ICD-10-CM | POA: Diagnosis not present

## 2023-07-25 DIAGNOSIS — Z794 Long term (current) use of insulin: Secondary | ICD-10-CM | POA: Insufficient documentation

## 2023-07-25 HISTORY — PX: INJECTION OF SILICONE OIL: SHX6422

## 2023-07-25 HISTORY — PX: MEMBRANE PEEL: SHX5967

## 2023-07-25 HISTORY — PX: PARS PLANA VITRECTOMY: SHX2166

## 2023-07-25 HISTORY — DX: Chronic kidney disease, unspecified: N18.9

## 2023-07-25 LAB — CBC
HCT: 37.7 % — ABNORMAL LOW (ref 39.0–52.0)
Hemoglobin: 11.2 g/dL — ABNORMAL LOW (ref 13.0–17.0)
MCH: 25.3 pg — ABNORMAL LOW (ref 26.0–34.0)
MCHC: 29.7 g/dL — ABNORMAL LOW (ref 30.0–36.0)
MCV: 85.3 fL (ref 80.0–100.0)
Platelets: 247 10*3/uL (ref 150–400)
RBC: 4.42 MIL/uL (ref 4.22–5.81)
RDW: 16.4 % — ABNORMAL HIGH (ref 11.5–15.5)
WBC: 5.5 10*3/uL (ref 4.0–10.5)
nRBC: 0 % (ref 0.0–0.2)

## 2023-07-25 LAB — BASIC METABOLIC PANEL
Anion gap: 9 (ref 5–15)
BUN: 28 mg/dL — ABNORMAL HIGH (ref 6–20)
CO2: 25 mmol/L (ref 22–32)
Calcium: 8.5 mg/dL — ABNORMAL LOW (ref 8.9–10.3)
Chloride: 104 mmol/L (ref 98–111)
Creatinine, Ser: 1.78 mg/dL — ABNORMAL HIGH (ref 0.61–1.24)
GFR, Estimated: 43 mL/min — ABNORMAL LOW (ref >60)
Glucose, Bld: 169 mg/dL — ABNORMAL HIGH (ref 70–99)
Potassium: 4.9 mmol/L (ref 3.5–5.1)
Sodium: 138 mmol/L (ref 135–145)

## 2023-07-25 LAB — SURGICAL PCR SCREEN
MRSA, PCR: POSITIVE — AB
Staphylococcus aureus: POSITIVE — AB

## 2023-07-25 LAB — GLUCOSE, CAPILLARY
Glucose-Capillary: 163 mg/dL — ABNORMAL HIGH (ref 70–99)
Glucose-Capillary: 149 mg/dL — ABNORMAL HIGH (ref 70–99)
Glucose-Capillary: 175 mg/dL — ABNORMAL HIGH (ref 70–99)

## 2023-07-25 SURGERY — PARS PLANA VITRECTOMY WITH 25 GAUGE
Anesthesia: General | Site: Eye | Laterality: Left

## 2023-07-25 MED ORDER — PROPOFOL 10 MG/ML IV BOLUS
INTRAVENOUS | Status: AC
  Filled 2023-07-25: qty 20

## 2023-07-25 MED ORDER — STERILE WATER FOR INJECTION IJ SOLN
INTRAMUSCULAR | Status: DC | PRN
  Administered 2023-07-25: 20 mL

## 2023-07-25 MED ORDER — POLYMYXIN B SULFATE 500000 UNITS IJ SOLR
INTRAMUSCULAR | Status: AC
  Filled 2023-07-25: qty 10

## 2023-07-25 MED ORDER — OXYCODONE HCL 5 MG PO TABS
5.0000 mg | ORAL_TABLET | Freq: Once | ORAL | Status: AC | PRN
  Administered 2023-07-25: 5 mg via ORAL

## 2023-07-25 MED ORDER — FENTANYL CITRATE (PF) 100 MCG/2ML IJ SOLN: 25.0000 ug | INTRAMUSCULAR | Status: DC | PRN

## 2023-07-25 MED ORDER — FENTANYL CITRATE (PF) 100 MCG/2ML IJ SOLN
INTRAMUSCULAR | Status: AC
  Administered 2023-07-25: 50 ug via INTRAVENOUS
  Filled 2023-07-25: qty 2

## 2023-07-25 MED ORDER — OXYCODONE HCL 5 MG/5ML PO SOLN
5.0000 mg | Freq: Once | ORAL | Status: AC | PRN
Start: 2023-07-25 — End: 2023-07-25

## 2023-07-25 MED ORDER — SODIUM CHLORIDE 0.9 % IV SOLN: INTRAVENOUS | Status: DC | PRN

## 2023-07-25 MED ORDER — ESMOLOL HCL 100 MG/10ML IV SOLN
INTRAVENOUS | Status: DC | PRN
Start: 2023-07-25 — End: 2023-07-25
  Administered 2023-07-25: 50 mg via INTRAVENOUS

## 2023-07-25 MED ORDER — OXYCODONE HCL 5 MG PO TABS
ORAL_TABLET | ORAL | Status: AC
  Filled 2023-07-25: qty 1

## 2023-07-25 MED ORDER — LIDOCAINE HCL 1 % IJ SOLN
INTRAMUSCULAR | Status: AC
  Filled 2023-07-25: qty 20

## 2023-07-25 MED ORDER — SODIUM CHLORIDE (PF) 0.9 % IJ SOLN
INTRAMUSCULAR | Status: AC
  Filled 2023-07-25: qty 10

## 2023-07-25 MED ORDER — TRIAMCINOLONE ACETONIDE 40 MG/ML IJ SUSP
INTRAMUSCULAR | Status: AC
  Filled 2023-07-25: qty 5

## 2023-07-25 MED ORDER — BEVACIZUMAB CHEMO INJECTION 1.25MG/0.05ML SYRINGE FOR KALEIDOSCOPE
1.2500 mg | Freq: Once | INTRAVITREAL | Status: DC
  Filled 2023-07-25: qty 0.1

## 2023-07-25 MED ORDER — DEXAMETHASONE SODIUM PHOSPHATE 10 MG/ML IJ SOLN
INTRAMUSCULAR | Status: AC
  Filled 2023-07-25: qty 1

## 2023-07-25 MED ORDER — NA CHONDROIT SULF-NA HYALURON 40-30 MG/ML IO SOSY
INTRAOCULAR | Status: DC | PRN
  Administered 2023-07-25: .5 mL via INTRAOCULAR

## 2023-07-25 MED ORDER — EPHEDRINE SULFATE-NACL 50-0.9 MG/10ML-% IV SOSY
PREFILLED_SYRINGE | INTRAVENOUS | Status: DC | PRN
  Administered 2023-07-25: 10 mg via INTRAVENOUS

## 2023-07-25 MED ORDER — PREDNISOLONE ACETATE 1 % OP SUSP
OPHTHALMIC | Status: AC
  Filled 2023-07-25: qty 5

## 2023-07-25 MED ORDER — BACITRACIN-POLYMYXIN B 500-10000 UNIT/GM OP OINT
TOPICAL_OINTMENT | OPHTHALMIC | Status: DC | PRN
  Administered 2023-07-25: 1 via OPHTHALMIC

## 2023-07-25 MED ORDER — AMISULPRIDE (ANTIEMETIC) 5 MG/2ML IV SOLN: 10.0000 mg | Freq: Once | INTRAVENOUS | Status: DC | PRN

## 2023-07-25 MED ORDER — EPINEPHRINE PF 1 MG/ML IJ SOLN
INTRAMUSCULAR | Status: DC | PRN
  Administered 2023-07-25: .3 mg

## 2023-07-25 MED ORDER — CHLORHEXIDINE GLUCONATE 0.12 % MT SOLN
15.0000 mL | Freq: Once | OROMUCOSAL | Status: AC
  Administered 2023-07-25: 15 mL via OROMUCOSAL
  Filled 2023-07-25: qty 15

## 2023-07-25 MED ORDER — ONDANSETRON HCL 4 MG/2ML IJ SOLN: 4.0000 mg | Freq: Once | INTRAMUSCULAR | Status: DC | PRN

## 2023-07-25 MED ORDER — PREDNISOLONE ACETATE 1 % OP SUSP
OPHTHALMIC | Status: DC | PRN
  Administered 2023-07-25: 1 [drp] via OPHTHALMIC

## 2023-07-25 MED ORDER — GATIFLOXACIN 0.5 % OP SOLN
OPHTHALMIC | Status: DC | PRN
  Administered 2023-07-25: 1 [drp] via OPHTHALMIC

## 2023-07-25 MED ORDER — ATROPINE SULFATE 1 % OP SOLN
1.0000 [drp] | OPHTHALMIC | Status: AC | PRN
  Administered 2023-07-25 (×3): 1 [drp] via OPHTHALMIC
  Filled 2023-07-25: qty 2

## 2023-07-25 MED ORDER — NA CHONDROIT SULF-NA HYALURON 40-30 MG/ML IO SOSY
INTRAOCULAR | Status: AC
  Filled 2023-07-25: qty 1

## 2023-07-25 MED ORDER — CEFTAZIDIME 1 G IJ SOLR
INTRAMUSCULAR | Status: AC
  Filled 2023-07-25: qty 1

## 2023-07-25 MED ORDER — BRILLIANT BLUE G 0.025 % IO SOSY
0.5000 mL | PREFILLED_SYRINGE | INTRAOCULAR | Status: DC
  Filled 2023-07-25: qty 0.5

## 2023-07-25 MED ORDER — BSS IO SOLN
INTRAOCULAR | Status: AC
  Filled 2023-07-25: qty 15

## 2023-07-25 MED ORDER — PHENYLEPHRINE HCL-NACL 20-0.9 MG/250ML-% IV SOLN
INTRAVENOUS | Status: DC | PRN
  Administered 2023-07-25: 40 ug/min via INTRAVENOUS

## 2023-07-25 MED ORDER — DEXAMETHASONE SODIUM PHOSPHATE 10 MG/ML IJ SOLN
INTRAMUSCULAR | Status: DC | PRN
  Administered 2023-07-25: 4 mg via INTRAVENOUS

## 2023-07-25 MED ORDER — BACITRACIN-POLYMYXIN B 500-10000 UNIT/GM OP OINT
TOPICAL_OINTMENT | OPHTHALMIC | Status: AC
  Filled 2023-07-25: qty 3.5

## 2023-07-25 MED ORDER — BUPIVACAINE HCL (PF) 0.75 % IJ SOLN
INTRAMUSCULAR | Status: AC
  Filled 2023-07-25: qty 10

## 2023-07-25 MED ORDER — BRIMONIDINE TARTRATE 0.2 % OP SOLN
OPHTHALMIC | Status: DC | PRN
  Administered 2023-07-25: 1 [drp] via OPHTHALMIC

## 2023-07-25 MED ORDER — INSULIN ASPART 100 UNIT/ML IJ SOLN
0.0000 [IU] | INTRAMUSCULAR | Status: DC | PRN
  Administered 2023-07-25: 2 [IU] via SUBCUTANEOUS
  Filled 2023-07-25: qty 1

## 2023-07-25 MED ORDER — SUGAMMADEX SODIUM 200 MG/2ML IV SOLN
INTRAVENOUS | Status: DC | PRN
  Administered 2023-07-25: 300 mg via INTRAVENOUS

## 2023-07-25 MED ORDER — FENTANYL CITRATE (PF) 100 MCG/2ML IJ SOLN: 50.0000 ug | Freq: Once | INTRAMUSCULAR | Status: AC

## 2023-07-25 MED ORDER — PHENYLEPHRINE HCL 10 % OP SOLN
1.0000 [drp] | OPHTHALMIC | Status: AC | PRN
  Administered 2023-07-25 (×3): 1 [drp] via OPHTHALMIC
  Filled 2023-07-25: qty 5

## 2023-07-25 MED ORDER — MIDAZOLAM HCL 2 MG/2ML IJ SOLN
INTRAMUSCULAR | Status: AC
  Filled 2023-07-25: qty 2

## 2023-07-25 MED ORDER — ATROPINE SULFATE 1 % OP SOLN
OPHTHALMIC | Status: DC | PRN
  Administered 2023-07-25: 2 [drp] via OPHTHALMIC

## 2023-07-25 MED ORDER — ORAL CARE MOUTH RINSE: 15.0000 mL | Freq: Once | OROMUCOSAL | Status: AC

## 2023-07-25 MED ORDER — ONDANSETRON HCL 4 MG/2ML IJ SOLN
INTRAMUSCULAR | Status: DC | PRN
  Administered 2023-07-25: 4 mg via INTRAVENOUS

## 2023-07-25 MED ORDER — BSS PLUS IO SOLN
INTRAOCULAR | Status: AC
  Filled 2023-07-25: qty 500

## 2023-07-25 MED ORDER — STERILE WATER FOR INJECTION IJ SOLN
INTRAMUSCULAR | Status: DC | PRN
  Administered 2023-07-25: 10 mL

## 2023-07-25 MED ORDER — LIDOCAINE 2% (20 MG/ML) 5 ML SYRINGE
INTRAMUSCULAR | Status: DC | PRN
  Administered 2023-07-25: 40 mg via INTRAVENOUS
  Administered 2023-07-25: 60 mg via INTRAVENOUS

## 2023-07-25 MED ORDER — TROPICAMIDE 1 % OP SOLN
1.0000 [drp] | OPHTHALMIC | Status: AC | PRN
  Administered 2023-07-25 (×3): 1 [drp] via OPHTHALMIC
  Filled 2023-07-25: qty 15

## 2023-07-25 MED ORDER — FENTANYL CITRATE (PF) 250 MCG/5ML IJ SOLN
INTRAMUSCULAR | Status: AC
  Filled 2023-07-25: qty 5

## 2023-07-25 MED ORDER — TRIAMCINOLONE ACETONIDE 40 MG/ML IJ SUSP
INTRAMUSCULAR | Status: DC | PRN
  Administered 2023-07-25: 1 mL

## 2023-07-25 MED ORDER — PHENYLEPHRINE 80 MCG/ML (10ML) SYRINGE FOR IV PUSH (FOR BLOOD PRESSURE SUPPORT)
PREFILLED_SYRINGE | INTRAVENOUS | Status: DC | PRN
  Administered 2023-07-25: 80 ug via INTRAVENOUS
  Administered 2023-07-25 (×2): 160 ug via INTRAVENOUS

## 2023-07-25 MED ORDER — STERILE WATER FOR INJECTION IJ SOLN
INTRAMUSCULAR | Status: AC
  Filled 2023-07-25: qty 10

## 2023-07-25 MED ORDER — BSS PLUS IO SOLN
INTRAOCULAR | Status: DC | PRN
  Administered 2023-07-25: 1 via INTRAOCULAR

## 2023-07-25 MED ORDER — PROPARACAINE HCL 0.5 % OP SOLN
1.0000 [drp] | OPHTHALMIC | Status: AC | PRN
  Administered 2023-07-25 (×3): 1 [drp] via OPHTHALMIC
  Filled 2023-07-25: qty 15

## 2023-07-25 MED ORDER — BSS IO SOLN
INTRAOCULAR | Status: DC | PRN
  Administered 2023-07-25: 15 mL via INTRAOCULAR

## 2023-07-25 MED ORDER — PROPOFOL 10 MG/ML IV BOLUS
INTRAVENOUS | Status: DC | PRN
  Administered 2023-07-25: 170 mg via INTRAVENOUS

## 2023-07-25 MED ORDER — DORZOLAMIDE HCL-TIMOLOL MAL 2-0.5 % OP SOLN
OPHTHALMIC | Status: DC | PRN
  Administered 2023-07-25: 1 [drp] via OPHTHALMIC

## 2023-07-25 MED ORDER — EPINEPHRINE PF 1 MG/ML IJ SOLN
INTRAMUSCULAR | Status: AC
  Filled 2023-07-25: qty 1

## 2023-07-25 MED ORDER — ATROPINE SULFATE 1 % OP SOLN
OPHTHALMIC | Status: AC
  Filled 2023-07-25: qty 5

## 2023-07-25 MED ORDER — BRIMONIDINE TARTRATE 0.2 % OP SOLN
OPHTHALMIC | Status: AC
  Filled 2023-07-25: qty 5

## 2023-07-25 MED ORDER — ROCURONIUM BROMIDE 10 MG/ML (PF) SYRINGE
PREFILLED_SYRINGE | INTRAVENOUS | Status: DC | PRN
  Administered 2023-07-25: 10 mg via INTRAVENOUS
  Administered 2023-07-25: 70 mg via INTRAVENOUS

## 2023-07-25 MED ORDER — MIDAZOLAM HCL 2 MG/2ML IJ SOLN
INTRAMUSCULAR | Status: DC | PRN
  Administered 2023-07-25: 2 mg via INTRAVENOUS

## 2023-07-25 MED ORDER — LIDOCAINE 2% (20 MG/ML) 5 ML SYRINGE
INTRAMUSCULAR | Status: AC
  Filled 2023-07-25: qty 5

## 2023-07-25 MED ORDER — FENTANYL CITRATE (PF) 250 MCG/5ML IJ SOLN
INTRAMUSCULAR | Status: DC | PRN
  Administered 2023-07-25 (×2): 25 ug via INTRAVENOUS
  Administered 2023-07-25 (×2): 50 ug via INTRAVENOUS

## 2023-07-25 MED ORDER — CYCLOPENTOLATE HCL 1 % OP SOLN
1.0000 [drp] | OPHTHALMIC | Status: AC | PRN
  Administered 2023-07-25 (×3): 1 [drp] via OPHTHALMIC
  Filled 2023-07-25: qty 2

## 2023-07-25 MED ORDER — ACETAZOLAMIDE SODIUM 500 MG IJ SOLR
INTRAMUSCULAR | Status: AC
  Filled 2023-07-25: qty 500

## 2023-07-25 MED ORDER — DORZOLAMIDE HCL-TIMOLOL MAL 2-0.5 % OP SOLN
OPHTHALMIC | Status: AC
  Filled 2023-07-25: qty 10

## 2023-07-25 MED ORDER — GATIFLOXACIN 0.5 % OP SOLN
OPHTHALMIC | Status: AC
  Filled 2023-07-25: qty 2.5

## 2023-07-25 MED ORDER — CARBACHOL 0.01 % IO SOLN
INTRAOCULAR | Status: AC
  Filled 2023-07-25: qty 1.5

## 2023-07-25 SURGICAL SUPPLY — 40 items
APL SWBSTK 6 STRL LF DISP (MISCELLANEOUS) ×4
APPLICATOR COTTON TIP 6 STRL (MISCELLANEOUS) ×4 IMPLANT
APPLICATOR COTTON TIP 6IN STRL (MISCELLANEOUS) ×4
BNDG EYE OVAL 2 1/8 X 2 5/8 (GAUZE/BANDAGES/DRESSINGS) ×1 IMPLANT
CABLE BIPOLOR RESECTION CORD (MISCELLANEOUS) ×1 IMPLANT
CANNULA DUALBORE 25G (CANNULA) ×1 IMPLANT
CANNULA FLEX TIP 25G (CANNULA) ×1 IMPLANT
CLSR STERI-STRIP ANTIMIC 1/2X4 (GAUZE/BANDAGES/DRESSINGS) ×1 IMPLANT
DRAPE INCISE 51X51 W/FILM STRL (DRAPES) ×1 IMPLANT
DRAPE MICROSCOPE LEICA 46X105 (MISCELLANEOUS) ×1 IMPLANT
DRAPE OPHTHALMIC 77X100 STRL (CUSTOM PROCEDURE TRAY) ×1 IMPLANT
GLOVE BIO SURGEON STRL SZ7.5 (GLOVE) ×2 IMPLANT
GLOVE BIOGEL M 7.0 STRL (GLOVE) ×1 IMPLANT
GOWN STRL REUS W/ TWL LRG LVL3 (GOWN DISPOSABLE) ×2 IMPLANT
GOWN STRL REUS W/ TWL XL LVL3 (GOWN DISPOSABLE) ×1 IMPLANT
GOWN STRL REUS W/TWL LRG LVL3 (GOWN DISPOSABLE) ×2
GOWN STRL REUS W/TWL XL LVL3 (GOWN DISPOSABLE) ×1
KIT BASIN OR (CUSTOM PROCEDURE TRAY) ×1 IMPLANT
KIT PERFLUORON PROCEDURE 5ML (MISCELLANEOUS) IMPLANT
NDL 18GX1X1/2 (RX/OR ONLY) (NEEDLE) ×2 IMPLANT
NDL 25GX 5/8IN NON SAFETY (NEEDLE) ×1 IMPLANT
NDL FILTER BLUNT 18X1 1/2 (NEEDLE) ×1 IMPLANT
NDL HYPO 30X.5 LL (NEEDLE) ×2 IMPLANT
NEEDLE 18GX1X1/2 (RX/OR ONLY) (NEEDLE) ×2 IMPLANT
NEEDLE 25GX 5/8IN NON SAFETY (NEEDLE) ×1 IMPLANT
NEEDLE FILTER BLUNT 18X1 1/2 (NEEDLE) ×1 IMPLANT
NEEDLE HYPO 30X.5 LL (NEEDLE) ×2 IMPLANT
NS IRRIG 1000ML POUR BTL (IV SOLUTION) ×1 IMPLANT
OIL SILICONE OPHTHALMIC 1000 (Ophthalmic Related) IMPLANT
PACK VITRECTOMY CUSTOM (CUSTOM PROCEDURE TRAY) ×1 IMPLANT
PAD ARMBOARD 7.5X6 YLW CONV (MISCELLANEOUS) ×2 IMPLANT
PAK PIK VITRECTOMY CVS 25GA (OPHTHALMIC) ×1 IMPLANT
SET INJECTOR OIL FLUID CONSTEL (OPHTHALMIC) IMPLANT
SHIELD EYE LENSE ONLY DISP (GAUZE/BANDAGES/DRESSINGS) IMPLANT
SUT VICRYL 7 0 TG140 8 (SUTURE) ×1 IMPLANT
SYR 10ML LL (SYRINGE) ×2 IMPLANT
SYR TB 1ML LUER SLIP (SYRINGE) ×2 IMPLANT
TOWEL GREEN STERILE FF (TOWEL DISPOSABLE) ×1 IMPLANT
TUBING HIGH PRESS EXTEN 6IN (TUBING) ×1 IMPLANT
WATER STERILE IRR 1000ML POUR (IV SOLUTION) ×1 IMPLANT

## 2023-07-25 NOTE — Brief Op Note (Signed)
07/25/2023  3:23 PM  PATIENT:  Brett White  62 y.o. male  PRE-OPERATIVE DIAGNOSIS:  tractional retinal detachment, left eye  POST-OPERATIVE DIAGNOSIS:  tractional retinal detachment, left eye  PROCEDURE:  Procedure(s): PARS PLANA VITRECTOMY WITH 25 GAUGE (Left) MEMBRANE PEEL (Left) INJECTION OF SILICONE OIL (Left)  SURGEON:  Surgeons and Role:    Rennis Chris, MD - Primary  ASSISTANTS: Laurian Brim, Ophthalmic Assistant    ANESTHESIA:   general  EBL:  0 mL   BLOOD ADMINISTERED:none  DRAINS: none   LOCAL MEDICATIONS USED:  NONE  SPECIMEN:  No Specimen  DISPOSITION OF SPECIMEN:  N/A  COUNTS:  YES  TOURNIQUET:  * No tourniquets in log *  DICTATION: .Note written in EPIC  PLAN OF CARE: Discharge to home after PACU  PATIENT DISPOSITION:  PACU - hemodynamically stable.   Delay start of Pharmacological VTE agent (>24hrs) due to surgical blood loss or risk of bleeding: not applicable

## 2023-07-25 NOTE — Anesthesia Procedure Notes (Signed)
Procedure Name: Intubation Date/Time: 07/25/2023 12:32 PM  Performed by: Stanton Kidney, CRNAPre-anesthesia Checklist: Patient identified, Patient being monitored, Timeout performed, Emergency Drugs available and Suction available Patient Re-evaluated:Patient Re-evaluated prior to induction Oxygen Delivery Method: Circle system utilized Preoxygenation: Pre-oxygenation with 100% oxygen Induction Type: IV induction Ventilation: Mask ventilation without difficulty Laryngoscope Size: Mac and 4 Grade View: Grade I Tube type: Oral Tube size: 7.0 mm Number of attempts: 1 Airway Equipment and Method: Stylet Placement Confirmation: ETT inserted through vocal cords under direct vision, positive ETCO2 and breath sounds checked- equal and bilateral Secured at: 23 cm Tube secured with: Tape Dental Injury: Teeth and Oropharynx as per pre-operative assessment  Comments: Intubation by Irving Burton, paramedic

## 2023-07-25 NOTE — Discharge Instructions (Addendum)
POSTOPERATIVE INSTRUCTIONS  Your doctor has performed vitreoretinal surgery on you at [place].  - Keep eye patched and shielded until seen by Dr. Nickisha Hum 8 AM tomorrow in clinic - Do not use drops until return - FACE DOWN POSITIONING WHILE AWAKE - Sleep with belly down or on right side, avoid laying flat on back.    - No strenuous bending, stooping or lifting.  - You may not drive until further notice.  - If your doctor used a gas bubble in your eye during the procedure he will advise you on postoperative positioning. If you have a gas bubble you will be wearing a green bracelet that was applied in the operating room. The green bracelet should stay on as long as the gas bubble is in your eye. While the gas bubble is present you should not fly in an airplane. If you require general anesthesia while the gas bubble is present you must notify your anesthesiologist that an intraocular gas bubble is present so he can take the appropriate precautions.  - Tylenol or any other over-the-counter pain reliever can be used according to your doctor. If more pain medicine is required, your doctor will have a prescription for you.  - You may read, go up and down stairs, and watch television.     Aidian Salomon, M.D., Ph.D.  

## 2023-07-25 NOTE — Transfer of Care (Signed)
Immediate Anesthesia Transfer of Care Note  Patient: Brett White  Procedure(s) Performed: PARS PLANA VITRECTOMY WITH 25 GAUGE (Left: Eye) MEMBRANE PEEL (Left: Eye) INJECTION OF SILICONE OIL (Left: Eye)  Patient Location: PACU  Anesthesia Type:General  Level of Consciousness: awake, alert , oriented, and patient cooperative  Airway & Oxygen Therapy: Patient Spontanous Breathing and Patient connected to face mask oxygen  Post-op Assessment: Report given to RN and Post -op Vital signs reviewed and stable  Post vital signs: Reviewed and stable  Last Vitals:  Vitals Value Taken Time  BP 149/88 07/25/23 1530  Temp    Pulse 90 07/25/23 1536  Resp 17 07/25/23 1536  SpO2 92 % 07/25/23 1536  Vitals shown include unfiled device data.  Last Pain:  Vitals:   07/25/23 1139  TempSrc:   PainSc: 10-Worst pain ever         Complications: No notable events documented.

## 2023-07-25 NOTE — Anesthesia Postprocedure Evaluation (Signed)
Anesthesia Post Note  Patient: Brett White  Procedure(s) Performed: PARS PLANA VITRECTOMY WITH 25 GAUGE (Left: Eye) MEMBRANE PEEL (Left: Eye) INJECTION OF SILICONE OIL (Left: Eye)     Patient location during evaluation: PACU Anesthesia Type: General Level of consciousness: awake and alert and oriented Pain management: pain level controlled Vital Signs Assessment: post-procedure vital signs reviewed and stable Respiratory status: spontaneous breathing, nonlabored ventilation and respiratory function stable Cardiovascular status: blood pressure returned to baseline and stable Postop Assessment: no apparent nausea or vomiting Anesthetic complications: no   No notable events documented.  Last Vitals:  Vitals:   07/25/23 0945 07/25/23 1530  BP: 139/88 (!) 149/88  Pulse: 97 87  Resp: 18 11  Temp: 37.1 C 37.6 C  SpO2: 93% 97%    Last Pain:  Vitals:   07/25/23 1139  TempSrc:   PainSc: 10-Worst pain ever                 Shalina Norfolk A.

## 2023-07-25 NOTE — Op Note (Signed)
Date of procedure:  10.24.2024   Surgeon: Rennis Chris, MD, PhD  Assistant: Laurian Brim, Ophthalmic Assistant   Pre-operative Diagnoses:  Proliferative diabetic retinopathy, left eye Vitreous hemorrhage, left eye Retinal detachment, left eye   Post-operative diagnosis:  Proliferative diabetic retinopathy, left eye Vitreous hemorrhage, left eye Focal, combined tractional / rhegmatogenous retinal detachment, left eye   Anesthesia: General   Procedure:   1. 25 gauge pars plana vitrectomy, Left Eye 2. Diabetic vitreous membrane peel, Left Eye  3. Perfluoron injection, Left Eye 4. Fluid-Air Exchange, Left Eye 5. Endolaser, Left Eye 6. Injection 1000 cs silicon oil, Left Eye    Indications for procedure: The patient presented with history of decreased vision due to prolifertiave diabetic retinopathy w/ diabetic vitreous hemorrhage and tractional retinal detachment, left eye. After discussing the risks, benefits, and alternatives to surgery, the patient elected to undergo surgical repair and informed consent was obtained.  The surgery was an attempt to clear the vitreous hemorrhage, repair the retinal detachment and treat and/or repair any issues or defects related to his proliferative diabetic retinopathy, within the reasonable expectations of the surgeon.    Procedure in Detail:    The patient was met in the pre-operative holding area where their identification data was verified.  It was noted that there was a signed, informed consent in the chart and the Left Eye eye was verbally verified by the patient as the operative eye and was marked with a marking pen. The patient was then taken to the operating room and placed in the supine position. General endotracheal anesthesia was induced.  The eye was then prepped with 5% betadine and draped in the normal fashion for ophthalmic surgery. The microscope was draped and swung into position, and a secondary time-out was performed to identify the  correct patient, eyes, procedures, and any allergies.   A 25 gauge trocar was inserted in a 30-45 degrees fashion into the inferotemporal quadrant 4 mm posterior to the limbus in this phakic patient.  Correct positioning within the vitreous was verified externally with the light pipe.  The infusion was then connected to the cannula and BSS infusion was commenced.  Additional ports were placed in the superonasal and superotemporal quadrants. Viscoat was placed on the cornea and the BIOM was used to visualize the posterior segment while the core vitrectomy was completed. The patient had a moderate vitreous hemorrhage. Once the bulk of the vitreous hemorrhage was cleared, we could clearly visualize a bullous, combined tractional and rhegmatogenous retinal detachment in the inferonasal quadrant spanning 0600 to 0900 oclock with subretinal fluid extending posteriorly to the inferonasal rim of the optic disc. Luckily there was no extension of subretinal fluid into the macula. Along the inferonasal arcades, there was a foci of fibrotic/tractional neovascularization with a round stretch hole adjacent to it as the source of the retinal detachment. A posterior vitreous detachment was confirmed using suction over the optic nerve head and lifting anteriorly. The remaining vitreous was very sticky and was meticulously removed. Kenalog was used to aid in this process.      Scleral depression was performed and used to meticulously shave the thick and adherent vitreous base and associated vitreous hemorrhage. Turning our attention to the retinal detachment, ILM forceps and the vitrectomy probe were used to peel and remove the tractional, diabetic membranes from the retinal break and surrounding retina. The retinal break and adjacent vessels were cauterized with fine point diathermy to gain hemostasis and prevent further hemorrhaging. All vitreous traction was  removed from this break. The soft-tipped extrusion cannula was used  to drain thick schleren and subretinal fluid through the break. Then perfluoron was injected to flatten the posterior pole. Under perfluoron, laser was applied around the break. Next, a complete air fluid exchange was performed over the break to drain the subretinal fluid and flatten the detached retina.  Under air, the endolaser was used to apply PRP laser 360 degrees from the arcades to the ora.. Following these maneuvers, the retina was reattached with good laser retinopexy around the retinal hole and good PRP laser 360. Then, 1000 cs silicon oil was injected via the superonasal trocar and the vitreous cavity was filled to the level of the lens plane with the aide of the venting cannula.   The superotemporal port was then removed and sutured with 7-0 vicryl, there was no leakage. The infusion port and venting ports were removed and they were sutured with 7-0 vicryl.  There was no leakage from the sclerotomy sites. The IOP was checked by digital palpation and found to be within physiologic range.   Subconjunctival injections of kefzol + bacitracin + polymixin b and kenalog were then administered, and antibiotic and steroid drops as well as antibiotic ointment were placed in the eye.  The drapes were removed and the eye was patched and shielded. The patient was then taken to the post-operative area for recovery having tolerated the procedure well.  He was instructed to perform face down positioning postoperatively and to follow up in clinic the following morning as scheduled.   Estimate blood lost: none Complications: None

## 2023-07-25 NOTE — Progress Notes (Signed)
[facility] - Clinic Note  07/26/2023   CHIEF COMPLAINT Patient presents for Retina Follow Up  HISTORY OF PRESENT ILLNESS: Brett White is a 62 y.o. male who presents to the clinic today for:  HPI     Retina Follow Up   Patient presents with  Diabetic Retinopathy.  In both eyes.  Severity is moderate.  Duration of 1 day.  Since onset it is stable.  I, the attending physician,  performed the HPI with the patient and updated documentation appropriately.        Comments   Pt here for 1 day POV s/p PPV OS, PDR OU. Pt states OS is painful this am, 9/10. "Feels like a stick is coming through center of it". Pt did take pain medicine 30 min before coming.       Last edited by Rennis Chris, MD on 07/28/2023 12:45 AM.    Pt states left eye vision is very blurry, he states he can see shadows out of it, but not count fingers, he states he went to sleep on his stomach and woke up on his back  Referring physician: Diona Foley, MD [address] [place],  [place] [address]  HISTORICAL INFORMATION:  Selected notes from the MEDICAL RECORD NUMBER Referred by Dr. Zenaida Niece for PDR w/ [facility] OU LEE:  Ocular Hx- PMH-   CURRENT MEDICATIONS: No current outpatient medications on file. (Ophthalmic Drugs)   No current facility-administered medications for this visit. (Ophthalmic Drugs)   Current Outpatient Medications (Other)  Medication Sig   acetaminophen (TYLENOL) 325 MG tablet Take 2 tablets (650 mg total) by mouth every 6 (six) hours as needed for mild pain (or Fever >/= 101).   amLODipine (NORVASC) 10 MG tablet Take 10 mg by mouth every morning.   atorvastatin (LIPITOR) 40 MG tablet Take 40 mg by mouth at bedtime.   bisacodyl (DULCOLAX) 5 MG EC tablet Take 10 mg by mouth daily as needed (CONSTIPATION).   cyclobenzaprine (FLEXERIL) 5 MG tablet Take 1 tablet (5 mg total) by mouth 3 (three) times daily as needed for muscle spasms.   DULoxetine (CYMBALTA) 30 MG capsule Take  1 capsule (30 mg total) by mouth daily. (Patient taking differently: Take 60 mg by mouth daily.)   ergocalciferol (VITAMIN D2) 1.25 MG (50000 UT) capsule Take 50,000 Units by mouth once a week.   famotidine (PEPCID) 20 MG tablet Take 20 mg by mouth daily.   FARXIGA 10 MG TABS tablet Take 10 mg by mouth every morning. Dapaglifozin   furosemide (LASIX) 20 MG tablet Take 20 mg by mouth every morning.   glipiZIDE (GLUCOTROL) 10 MG tablet Take 10 mg by mouth every morning.   hydrocortisone cream 1 % Apply 1 Application topically daily. Apply to right lower leg for wound care   hydrOXYzine (ATARAX) 10 MG tablet Take 1 tablet (10 mg total) by mouth 3 (three) times daily as needed for itching.   insulin lispro (HUMALOG) 100 UNIT/ML KwikPen Inject 2-8 Units into the skin 3 (three) times daily with meals. Sliding scale 201-250=2 units 251-300=4 units 301-350=6 units 351-400= 8 units 401 or greater than=call [facility]   LINZESS 145 MCG CAPS capsule Take 145 mcg by mouth daily.   losartan (COZAAR) 50 MG tablet Take 1 tablet (50 mg total) by mouth daily.   magnesium hydroxide (MILK OF MAGNESIA) 400 MG/5ML suspension Take 30 mLs by mouth daily as needed for mild constipation.   Melatonin 5 MG CAPS Take 5 mg  by mouth at bedtime.   naloxone (NARCAN) nasal spray 4 mg/0.1 mL Place 1 spray into the nose See admin instructions. Every 2-3 minutesas needed until patient response/ emba arrived.   oxyCODONE-acetaminophen (PERCOCET) 10-325 MG tablet Take 1 tablet by mouth every 4 (four) hours as needed for pain.   pantoprazole (PROTONIX) 40 MG tablet Take 1 tablet (40 mg total) by mouth daily.   polyethylene glycol (MIRALAX / GLYCOLAX) 17 g packet Take 17 g by mouth daily. (Patient taking differently: Take 17 g by mouth daily. With 4 oz of fluids and drink)   Polyethylene Glycol 3350 POWD Take 17 g by mouth every morning. Mix with 4 oz of fluids   pregabalin (LYRICA) 100 MG capsule Take 1 capsule (100 mg total) by mouth 2  (two) times daily. (Patient taking differently: Take 100 mg by mouth 4 (four) times daily.)   sertraline (ZOLOFT) 50 MG tablet Take 50 mg by mouth daily.   SODIUM PHOSPHATES RE Place 1 application  rectally daily. As needed constipation 3 of 4   traZODone (DESYREL) 150 MG tablet Take 75 mg by mouth at bedtime.   TRULICITY 1.5 MG/0.5ML SOPN Inject 0.5 mg into the skin once a week. Tuesday   No current facility-administered medications for this visit. (Other)   REVIEW OF SYSTEMS: ROS   Positive for: Musculoskeletal, Endocrine, Cardiovascular, Eyes Negative for: Constitutional, Gastrointestinal, Neurological, Skin, Genitourinary, HENT, Respiratory, Psychiatric, Allergic/Imm, Heme/Lymph Last edited by Thompson Grayer, COT on 07/26/2023  8:30 AM.     ALLERGIES No Known Allergies PAST MEDICAL HISTORY Past Medical History:  Diagnosis Date   CAP (community acquired pneumonia) 12/09/2021   CKD (chronic kidney disease)    GERD (gastroesophageal reflux disease)    HTN (hypertension)    Insulin dependent type 2 diabetes mellitus (HCC)    Neuropathy    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   Stroke [facility])    Past Surgical History:  Procedure Laterality Date   ABDOMINAL AORTOGRAM W/LOWER EXTREMITY N/A 06/20/2023   Procedure: ABDOMINAL AORTOGRAM W/LOWER EXTREMITY;  Surgeon: Cephus Shelling, MD;  Location: [facility] INVASIVE CV LAB;  Service: Cardiovascular;  Laterality: N/A;   AMPUTATION Left 02/04/2021   Procedure: LEFT GREAT TOE AMPUTATION;  Surgeon: [name], MD;  Location: [facility] OR;  Service: Orthopedics;  Laterality: Left;   AMPUTATION Left 02/10/2021   Procedure: LEFT FOOT 1ST RAY AMPUTATION;  Surgeon: [name], MD;  Location: [facility] OR;  Service: Orthopedics;  Laterality: Left;   AMPUTATION Left 03/22/2021   Procedure: LEFT BELOW KNEE AMPUTATION;  Surgeon: [name], MD;  Location: [facility] OR;  Service: Orthopedics;  Laterality: Left;   APPENDECTOMY     BACK  SURGERY     I & D EXTREMITY Left 03/03/2021   Procedure: LEFT FOOT DEBRIDEMENT;  Surgeon: [name], MD;  Location: [facility] OR;  Service: Orthopedics;  Laterality: Left;   INCISION AND DRAINAGE ABSCESS N/A 10/06/2021   Procedure: INCISION AND DRAINAGE BACK ABSCESS;  Surgeon: Berna Bue, MD;  Location: [facility] OR;  Service: General;  Laterality: N/A;   INJECTION OF SILICONE OIL Left 07/25/2023   Procedure: INJECTION OF SILICONE OIL;  Surgeon: Rennis Chris, MD;  Location: [facility] OR;  Service: Ophthalmology;  Laterality: Left;   MEMBRANE PEEL Left 07/25/2023   Procedure: MEMBRANE PEEL;  Surgeon: Rennis Chris, MD;  Location: [facility] OR;  Service: Ophthalmology;  Laterality: Left;   PARS PLANA VITRECTOMY Left 07/25/2023   Procedure: PARS PLANA  VITRECTOMY WITH 25 GAUGE;  Surgeon: Rennis Chris, MD;  Location: [facility] OR;  Service: Ophthalmology;  Laterality: Left;   removal of back cyst     TONSILLECTOMY     FAMILY HISTORY Family History  Problem Relation Age of Onset   Diabetes Mother    Heart attack Neg Hx    SOCIAL HISTORY Social History   Tobacco Use   Smoking status: Never   Smokeless tobacco: Never  Vaping Use   Vaping status: Never Used  Substance Use Topics   Alcohol use: Not Currently   Drug use: Not Currently       OPHTHALMIC EXAM:  Base Eye Exam     Visual Acuity (Snellen - Linear)       Right Left   Dist [place] CF at 3' HM   Dist ph [place] NI NI         Tonometry (Tonopen, 8:34 AM)       Right Left   Pressure 17 19         Pupils       Dark Light Shape React APD   Right 3 2 Round Brisk None   Left 6 6 Round NR None         Visual Fields (Counting fingers)       Left Right   Restrictions Total superior temporal, inferior temporal, superior nasal, inferior nasal deficiencies Total superior temporal, inferior temporal, superior nasal, inferior nasal deficiencies         Extraocular Movement       Right Left    Full, Ortho Full, Ortho         Neuro/Psych      Oriented x3: Yes   Mood/Affect: Normal         Dilation     Left eye: 1.0% Mydriacyl, 2.5% Phenylephrine @ 8:36 AM           Slit Lamp and Fundus Exam     External Exam       Right Left   External Normal Normal         Slit Lamp Exam       Right Left   Lids/Lashes Dermatochalasis - upper lid Dermatochalasis - upper lid   Conjunctiva/Sclera White and quiet sutures intact, mild chemosis inferiorly   Cornea 1-2+ Guttata trace PEE, 1+ Descemet's folds, fine endo pigment   Anterior Chamber Deep and quiet moderate depth, narrow angles   Iris Round and reactive Round and dilated, No NVI   Lens 2+ Nuclear sclerosis, 2+ Cortical cataract, 2-3+ Posterior subcapsular cataract 2+ Nuclear sclerosis, 2-3+ Cortical cataract, trace PSC   Anterior Vitreous Vitreous syneresis, Posterior vitreous detachment, blood stained Vitreous condensations post vitrectomy, good silicone oil fill         Fundus Exam       Right Left   Disc Pink and sharp, no NVD Hazy view, perfused   C/D Ratio 0.5 0.3   Macula Flat, Blunted foveal reflex, scattered MA/DBH, + central edema hazy view, grossly flat   Vessels Vascular attenuation, Tortuous Vascular attenuation, Tortuous; +NVE   Periphery Attached, scattered MA/DBH, inferior retina obsured VH / blood clots Hazy view, 360 PRP laser changes, PRE-OP: 360 MA/DBH, focal tractional RD IN quad -- focal tractional fibrosis / NVE along IN arcades - SRF extends posteriorly to IN disc           IMAGING AND PROCEDURES  Imaging and Procedures for 07/26/2023         ASSESSMENT/PLAN:  ICD-10-CM   1. Proliferative diabetic retinopathy of both eyes with macular edema associated with type 2 diabetes mellitus (HCC)  W09.8119     2. Diabetes mellitus treated with oral medication (HCC)  E11.9    Z79.84     3. Encounter for long-term (current) use of insulin (HCC)  Z79.4     4. Diabetes mellitus treated with injections of non-insulin medication (HCC)   E11.9    Z79.85     5. Vitreous hemorrhage of both eyes (HCC)  H43.13     6. Retinal detachment, tractional, left eye  H33.42     7. Essential hypertension  I10     8. Hypertensive retinopathy of both eyes  H35.033     9. Combined forms of age-related cataract of both eyes  H25.813      1-5. 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, OU (OD > OS) - last A1C 10.8 (09.12.23) - s/p IVA OD #1 (10.16.24) - s/p IVA OS #1 (10.18.24) - BCVA OD CF; OS HM - exam shows diffuse VH OU; OS with TRD inf nasal periphery - OCT shows: OD: +central edema greatest temporal fovea and macula, OS: +DME greatest superior and temporal fovea and macula, +SRF IN periphery and midzone extending to disc-- tractional RD -- +DM OU - FA (10.16.24) OD: Diffuse blockage causing poor images,  no obvious NV, scattered patches of vascular non perfusion; OS: Focal patch of NVE IN midzone, scattered patches of vascular  non profusion greatest IN periphery  - s/p POD1 s/p PPV/MP/PFO/EL/SO OS, 10.24.2024 - doing well this morning - retina attached and in good position under oil -- good oil fill - IOP okay at 19  - start PF 4x/day OS  zymaxid QID OS  Atropine BID OS  Brimonidine BID OS PSO ung QID OS  - cont face down positioning x3 days; avoid laying flat on back  - eye shield when sleeping  - post op drop and positioning instructions reviewed  - tylenol/ibuprofen for pain  - f/u October 31st -- POW1  6. Traction retinal detachment, OS - focal traction RD IN quad--focal traction fibrosis along IN arcades, SRF extended IN disc - s/p PPV/MP/PFO/EL/SO OS, 10.24.2024 -- see above - f/u POW1 - Friday 10.25.24   7,8. Hypertensive retinopathy OU - discussed importance of tight BP control - monitor   9. 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 - OD with large central PSC - discussed diagnosis and progression - under expert care of  Dr. Zenaida Niece - monitor   Ophthalmic Meds Ordered this visit:  No orders of the defined types were placed in this encounter.    Return in about 6 days (around 08/01/2023) for f/u PDR OU, POV.  There are no Patient Instructions on file for this visit.  This document serves as a record of services personally performed by Karie Chimera, MD, PhD. It was created on their behalf by Glee Arvin. Manson Passey, OA an ophthalmic technician. The creation of this record is the provider's dictation and/or activities during the visit.    Electronically signed by: Glee Arvin. Manson Passey, OA 07/28/23 12:45 AM  Karie Chimera, M.D., Ph.D. Diseases & Surgery of the Retina and Vitreous [facility] 07/26/2023   I have reviewed the above documentation for accuracy and completeness, and I agree with the above. Karie Chimera, M.D., Ph.D. 07/28/23 1:15 AM   Abbreviations: M myopia (nearsighted); A astigmatism;  H hyperopia (farsighted); P presbyopia; Mrx spectacle prescription;  CTL contact lenses; OD right eye; OS left eye; OU both eyes  XT exotropia; ET esotropia; PEK punctate epithelial keratitis; PEE punctate epithelial erosions; DES dry eye syndrome; MGD meibomian gland dysfunction; ATs artificial tears; PFAT's preservative free artificial tears; NSC nuclear sclerotic cataract; PSC posterior subcapsular cataract; ERM epi-retinal membrane; PVD posterior vitreous detachment; RD retinal detachment; DM diabetes mellitus; DR diabetic retinopathy; NPDR non-proliferative diabetic retinopathy; PDR proliferative diabetic retinopathy; CSME clinically significant macular edema; DME diabetic macular edema; dbh dot blot hemorrhages; CWS cotton wool spot; POAG primary open angle glaucoma; C/D cup-to-disc ratio; HVF humphrey visual field; GVF goldmann visual field; OCT optical coherence tomography; IOP intraocular pressure; BRVO Branch retinal vein occlusion; CRVO central retinal vein occlusion; CRAO central retinal  artery occlusion; BRAO branch retinal artery occlusion; RT retinal tear; SB scleral buckle; PPV pars plana vitrectomy; VH Vitreous hemorrhage; PRP panretinal laser photocoagulation; IVK intravitreal kenalog; VMT vitreomacular traction; MH Macular hole;  NVD neovascularization of the disc; NVE neovascularization elsewhere; AREDS age related eye disease study; ARMD age related macular degeneration; POAG primary open angle glaucoma; EBMD epithelial/anterior basement membrane dystrophy; ACIOL anterior chamber intraocular lens; IOL intraocular lens; PCIOL posterior chamber intraocular lens; Phaco/IOL phacoemulsification with intraocular lens placement; PRK photorefractive keratectomy; LASIK laser assisted in situ keratomileusis; HTN hypertension; DM diabetes mellitus; COPD chronic obstructive pulmonary disease

## 2023-07-25 NOTE — Interval H&P Note (Signed)
History and Physical Interval Note:  07/25/2023 11:30 AM  Brett White  has presented today for surgery, with the diagnosis of tractional retinal detachment, left eye.  The various methods of treatment have been discussed with the patient and family. After consideration of risks, benefits and other options for treatment, the patient has consented to  Procedure(s): PARS PLANA VITRECTOMY WITH 25 GAUGE (Left) MEMBRANE PEEL (Left) as a surgical intervention.  The patient's history has been reviewed, patient examined, no change in status, stable for surgery.  I have reviewed the patient's chart and labs.  Questions were answered to the patient's satisfaction.     Rennis Chris

## 2023-07-26 ENCOUNTER — Ambulatory Visit (INDEPENDENT_AMBULATORY_CARE_PROVIDER_SITE_OTHER): Payer: Medicare Other | Admitting: Ophthalmology

## 2023-07-26 ENCOUNTER — Encounter (INDEPENDENT_AMBULATORY_CARE_PROVIDER_SITE_OTHER): Payer: Self-pay | Admitting: Ophthalmology

## 2023-07-26 DIAGNOSIS — H3342 Traction detachment of retina, left eye: Secondary | ICD-10-CM

## 2023-07-26 DIAGNOSIS — Z7984 Long term (current) use of oral hypoglycemic drugs: Secondary | ICD-10-CM

## 2023-07-26 DIAGNOSIS — H35033 Hypertensive retinopathy, bilateral: Secondary | ICD-10-CM

## 2023-07-26 DIAGNOSIS — E113513 Type 2 diabetes mellitus with proliferative diabetic retinopathy with macular edema, bilateral: Secondary | ICD-10-CM

## 2023-07-26 DIAGNOSIS — Z794 Long term (current) use of insulin: Secondary | ICD-10-CM

## 2023-07-26 DIAGNOSIS — I10 Essential (primary) hypertension: Secondary | ICD-10-CM

## 2023-07-26 DIAGNOSIS — E119 Type 2 diabetes mellitus without complications: Secondary | ICD-10-CM

## 2023-07-26 DIAGNOSIS — H25813 Combined forms of age-related cataract, bilateral: Secondary | ICD-10-CM

## 2023-07-26 DIAGNOSIS — H4313 Vitreous hemorrhage, bilateral: Secondary | ICD-10-CM

## 2023-07-26 DIAGNOSIS — Z7985 Long-term (current) use of injectable non-insulin antidiabetic drugs: Secondary | ICD-10-CM

## 2023-07-28 ENCOUNTER — Encounter (INDEPENDENT_AMBULATORY_CARE_PROVIDER_SITE_OTHER): Payer: Self-pay | Admitting: Ophthalmology

## 2023-07-29 NOTE — Progress Notes (Signed)
[facility] - Clinic Note  08/01/2023   CHIEF COMPLAINT Patient presents for Retina Follow Up  HISTORY OF PRESENT ILLNESS: Brett White is a 62 y.o. male who presents to the clinic today for:  HPI     Retina Follow Up   Patient presents with  Diabetic Retinopathy.  In both eyes.  Severity is moderate.  Duration of 6 days.  Since onset it is stable.  I, the attending physician,  performed the HPI with the patient and updated documentation appropriately.        Comments   Patient states he is still unable to see his phone. He is using PF QID, zymaxid QID OS, Atropine BID OS, Brimonidine BID OS, and PSO ung QID OS. His blood sugar was 123.        Last edited by Rennis Chris, MD on 08/01/2023  1:21 PM.    Pt states he is sleeping on his side or on his stomach, his eye feels like it has a stick in it  Referring physician: Diona Foley, MD [address] [place],  [place] [address]  HISTORICAL INFORMATION:  Selected notes from the MEDICAL RECORD NUMBER Referred by Dr. Zenaida Niece for PDR w/ [facility] OU LEE:  Ocular Hx- PMH-   CURRENT MEDICATIONS: Current Outpatient Medications (Ophthalmic Drugs)  Medication Sig   bacitracin-polymyxin b (POLYSPORIN) ophthalmic ointment Place into the right eye 4 (four) times daily. Place a 1/2 inch ribbon of ointment into the lower eyelid 4 a day and as needed for discomfort   prednisoLONE acetate (PRED FORTE) 1 % ophthalmic suspension Place 1 drop into the left eye 4 (four) times daily.   No current facility-administered medications for this visit. (Ophthalmic Drugs)   Current Outpatient Medications (Other)  Medication Sig   acetaminophen (TYLENOL) 325 MG tablet Take 2 tablets (650 mg total) by mouth every 6 (six) hours as needed for mild pain (or Fever >/= 101).   amLODipine (NORVASC) 10 MG tablet Take 10 mg by mouth every morning.   atorvastatin (LIPITOR) 40 MG tablet Take 40 mg by mouth at bedtime.   bisacodyl (DULCOLAX) 5  MG EC tablet Take 10 mg by mouth daily as needed (CONSTIPATION).   cyclobenzaprine (FLEXERIL) 5 MG tablet Take 1 tablet (5 mg total) by mouth 3 (three) times daily as needed for muscle spasms.   DULoxetine (CYMBALTA) 30 MG capsule Take 1 capsule (30 mg total) by mouth daily. (Patient taking differently: Take 60 mg by mouth daily.)   ergocalciferol (VITAMIN D2) 1.25 MG (50000 UT) capsule Take 50,000 Units by mouth once a week.   famotidine (PEPCID) 20 MG tablet Take 20 mg by mouth daily.   FARXIGA 10 MG TABS tablet Take 10 mg by mouth every morning. Dapaglifozin   furosemide (LASIX) 20 MG tablet Take 20 mg by mouth every morning.   glipiZIDE (GLUCOTROL) 10 MG tablet Take 10 mg by mouth every morning.   hydrocortisone cream 1 % Apply 1 Application topically daily. Apply to right lower leg for wound care   hydrOXYzine (ATARAX) 10 MG tablet Take 1 tablet (10 mg total) by mouth 3 (three) times daily as needed for itching.   insulin lispro (HUMALOG) 100 UNIT/ML KwikPen Inject 2-8 Units into the skin 3 (three) times daily with meals. Sliding scale 201-250=2 units 251-300=4 units 301-350=6 units 351-400= 8 units 401 or greater than=call [facility]   LINZESS 145 MCG CAPS capsule Take 145 mcg by mouth daily.   losartan (COZAAR) 50 MG  tablet Take 1 tablet (50 mg total) by mouth daily.   magnesium hydroxide (MILK OF MAGNESIA) 400 MG/5ML suspension Take 30 mLs by mouth daily as needed for mild constipation.   Melatonin 5 MG CAPS Take 5 mg by mouth at bedtime.   naloxone (NARCAN) nasal spray 4 mg/0.1 mL Place 1 spray into the nose See admin instructions. Every 2-3 minutesas needed until patient response/ emba arrived.   oxyCODONE-acetaminophen (PERCOCET) 10-325 MG tablet Take 1 tablet by mouth every 4 (four) hours as needed for pain.   pantoprazole (PROTONIX) 40 MG tablet Take 1 tablet (40 mg total) by mouth daily.   polyethylene glycol (MIRALAX / GLYCOLAX) 17 g packet Take 17 g by mouth daily. (Patient taking  differently: Take 17 g by mouth daily. With 4 oz of fluids and drink)   Polyethylene Glycol 3350 POWD Take 17 g by mouth every morning. Mix with 4 oz of fluids   pregabalin (LYRICA) 100 MG capsule Take 1 capsule (100 mg total) by mouth 2 (two) times daily. (Patient taking differently: Take 100 mg by mouth 4 (four) times daily.)   sertraline (ZOLOFT) 50 MG tablet Take 50 mg by mouth daily.   SODIUM PHOSPHATES RE Place 1 application  rectally daily. As needed constipation 3 of 4   traZODone (DESYREL) 150 MG tablet Take 75 mg by mouth at bedtime.   TRULICITY 1.5 MG/0.5ML SOPN Inject 0.5 mg into the skin once a week. Tuesday   No current facility-administered medications for this visit. (Other)   REVIEW OF SYSTEMS: ROS   Positive for: Musculoskeletal, Endocrine, Cardiovascular, Eyes Negative for: Constitutional, Gastrointestinal, Neurological, Skin, Genitourinary, HENT, Respiratory, Psychiatric, Allergic/Imm, Heme/Lymph Last edited by Charlette Caffey, COT on 08/01/2023  8:04 AM.      ALLERGIES No Known Allergies PAST MEDICAL HISTORY Past Medical History:  Diagnosis Date   CAP (community acquired pneumonia) 12/09/2021   CKD (chronic kidney disease)    GERD (gastroesophageal reflux disease)    HTN (hypertension)    Insulin dependent type 2 diabetes mellitus (HCC)    Neuropathy    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   Stroke [facility])    Past Surgical History:  Procedure Laterality Date   ABDOMINAL AORTOGRAM W/LOWER EXTREMITY N/A 06/20/2023   Procedure: ABDOMINAL AORTOGRAM W/LOWER EXTREMITY;  Surgeon: Cephus Shelling, MD;  Location: [facility] INVASIVE CV LAB;  Service: Cardiovascular;  Laterality: N/A;   AMPUTATION Left 02/04/2021   Procedure: LEFT GREAT TOE AMPUTATION;  Surgeon: [name], MD;  Location: [facility] OR;  Service: Orthopedics;  Laterality: Left;   AMPUTATION Left 02/10/2021   Procedure: LEFT FOOT 1ST RAY AMPUTATION;  Surgeon: [name], MD;   Location: [facility] OR;  Service: Orthopedics;  Laterality: Left;   AMPUTATION Left 03/22/2021   Procedure: LEFT BELOW KNEE AMPUTATION;  Surgeon: [name], MD;  Location: [facility] OR;  Service: Orthopedics;  Laterality: Left;   APPENDECTOMY     BACK SURGERY     I & D EXTREMITY Left 03/03/2021   Procedure: LEFT FOOT DEBRIDEMENT;  Surgeon: [name], MD;  Location: [facility] OR;  Service: Orthopedics;  Laterality: Left;   INCISION AND DRAINAGE ABSCESS N/A 10/06/2021   Procedure: INCISION AND DRAINAGE BACK ABSCESS;  Surgeon: Berna Bue, MD;  Location: [facility] OR;  Service: General;  Laterality: N/A;   INJECTION OF SILICONE OIL Left 07/25/2023   Procedure: INJECTION OF SILICONE OIL;  Surgeon: Rennis Chris, MD;  Location: [facility] OR;  Service: Ophthalmology;  Laterality: Left;   MEMBRANE PEEL Left 07/25/2023   Procedure: MEMBRANE PEEL;  Surgeon: Rennis Chris, MD;  Location: [facility] OR;  Service: Ophthalmology;  Laterality: Left;   PARS PLANA VITRECTOMY Left 07/25/2023   Procedure: PARS PLANA VITRECTOMY WITH 25 GAUGE;  Surgeon: Rennis Chris, MD;  Location: [facility] OR;  Service: Ophthalmology;  Laterality: Left;   removal of back cyst     TONSILLECTOMY     FAMILY HISTORY Family History  Problem Relation Age of Onset   Diabetes Mother    Heart attack Neg Hx    SOCIAL HISTORY Social History   Tobacco Use   Smoking status: Never   Smokeless tobacco: Never  Vaping Use   Vaping status: Never Used  Substance Use Topics   Alcohol use: Not Currently   Drug use: Not Currently       OPHTHALMIC EXAM:  Base Eye Exam     Visual Acuity (Snellen - Linear)       Right Left   Dist [place] CF at 3' CF at 3'   Dist ph [place] NI NI         Tonometry (Tonopen, 8:07 AM)       Right Left   Pressure 14 16         Pupils       Dark Light Shape React APD   Right 3 2 Round Slow None   Left 6 6 Round NR None         Extraocular Movement       Right Left    Full, Ortho Full, Ortho         Neuro/Psych      Oriented x3: Yes   Mood/Affect: Normal         Dilation     Both eyes: 1.0% Mydriacyl, 2.5% Phenylephrine @ 8:05 AM           Slit Lamp and Fundus Exam     External Exam       Right Left   External Normal Normal         Slit Lamp Exam       Right Left   Lids/Lashes Dermatochalasis - upper lid Dermatochalasis - upper lid   Conjunctiva/Sclera White and quiet sutures intact, chemosis inferiorly -- improving   Cornea 1-2+ Guttata trace PEE, fine endo pigment   Anterior Chamber Deep and quiet moderate depth, narrow angles   Iris Round and reactive Round and dilated, No NVI   Lens 2+ Nuclear sclerosis, 2+ Cortical cataract, 2-3+ Posterior subcapsular cataract 2-3+ Nuclear sclerosis, 2-3+ Cortical cataract, trace PSC   Anterior Vitreous Vitreous syneresis, Posterior vitreous detachment, blood stained Vitreous condensations settled inferiorly post vitrectomy, good silicone oil fill         Fundus Exam       Right Left   Disc Pink and sharp, no NVD Pink and Sharp   C/D Ratio 0.5 0.3   Macula Hazy view, Flat, Blunted foveal reflex, scattered MA/DBH, +central edema flat under oil, scattered MA/DBH, +edema   Vessels attenuated, Tortuous attenuated, mild tortuosity   Periphery Attached, scattered MA/DBH, inferior retina obsured VH / blood clots Inferonasal retina reattached under oil; 360 PRP laser changes, PRE-OP: 360 MA/DBH, focal tractional RD IN quad -- focal tractional fibrosis / NVE along IN arcades - SRF extends posteriorly to IN disc           IMAGING AND PROCEDURES  Imaging and Procedures for 08/01/2023  OCT, Retina - OU - Both  Eyes       Right Eye Quality was poor. Central Foveal Thickness: 361. Progression has improved. Findings include no SRF, abnormal foveal contour (+central edema greatest temporal fovea and macula -- slightly improved).   Left Eye Quality was good. Central Foveal Thickness: 294. Progression has improved. Findings include no SRF,  abnormal foveal contour, intraretinal hyper-reflective material (+DME greatest superior and temporal fovea and macula -- persistent, IN retinal reattached).   Notes *Images captured and stored on drive  Diagnosis / Impression:  OD: +central edema greatest temporal fovea and macula -- slightly improved OS: +DME greatest superior and temporal fovea and macula -- persistent, IN retinal reattached  Clinical management:  See below  Abbreviations: NFP - Normal foveal profile. CME - cystoid macular edema. PED - pigment epithelial detachment. IRF - intraretinal fluid. SRF - subretinal fluid. EZ - ellipsoid zone. ERM - epiretinal membrane. ORA - outer retinal atrophy. ORT - outer retinal tubulation. SRHM - subretinal hyper-reflective material. IRHM - intraretinal hyper-reflective material           ASSESSMENT/PLAN:   ICD-10-CM   1. 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 Diabetes mellitus treated with oral medication (HCC)  E11.9    Z79.84     3. Encounter for long-term (current) use of insulin (HCC)  Z79.4     4. Diabetes mellitus treated with injections of non-insulin medication (HCC)  E11.9    Z79.85     5. Vitreous hemorrhage of both eyes (HCC)  H43.13     6. Retinal detachment, tractional, left eye  H33.42     7. Essential hypertension  I10     8. Hypertensive retinopathy of both eyes  H35.033     9. Combined forms of age-related cataract of both eyes  H25.813      1-5. 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, OU (OD > OS) - last A1C 10.8 (09.12.23) - s/p IVA OD #1 (10.16.24) - s/p IVA OS #1 (10.18.24) - BCVA OD CF; OS HM - exam shows diffuse VH OU; OS with TRD inf nasal periphery - OCT shows: OD: +central edema greatest temporal fovea and macula, OS: +DME greatest superior and temporal fovea and macula, +SRF IN periphery and midzone extending to disc--  tractional RD -- +DM OU - FA (10.16.24) OD: Diffuse blockage causing poor images,  no obvious NV, scattered patches of vascular non perfusion; OS: Focal patch of NVE IN midzone, scattered patches of vascular  non profusion greatest IN periphery  - s/p POW1 s/p PPV/MP/PFO/EL/SO OS, 10.24.2024 - doing well  - retina attached and in good position under oil -- good oil fill - IOP 16 - cont PF 4x/day OS  zymaxid QID OS -- stop on Monday Atropine BID OS -- okay to stop now Brimonidine BID OS PSO ung QID OS  - cont face down positioning; avoid laying flat on back  - eye shield when sleeping  - post op drop and positioning instructions reviewed  - tylenol/ibuprofen for pain  - f/u November 15th -- POW1  6. Traction retinal detachment, OS - focal traction RD IN quad--focal traction fibrosis along IN arcades, SRF extended IN disc - s/p PPV/MP/PFO/EL/SO OS, 10.24.2024 -- see above - retina reattached under oil - drops as above   7,8. Hypertensive retinopathy OU - discussed importance of tight BP control - monitor   9. Mixed Cataract OU,  OD>OS - The symptoms of cataract, surgical options, and treatments and risks were discussed with patient. - OD with large central PSC - discussed diagnosis and progression - under expert care of Dr. Zenaida Niece -- appt scheduled for December 17th - monitor   Ophthalmic Meds Ordered this visit:  Meds ordered this encounter  Medications   prednisoLONE acetate (PRED FORTE) 1 % ophthalmic suspension    Sig: Place 1 drop into the left eye 4 (four) times daily.    Dispense:  15 mL    Refill:  0   bacitracin-polymyxin b (POLYSPORIN) ophthalmic ointment    Sig: Place into the right eye 4 (four) times daily. Place a 1/2 inch ribbon of ointment into the lower eyelid 4 a day and as needed for discomfort    Dispense:  3.5 g    Refill:  3     Return in about 15 days (around 08/16/2023) for f/u PDR OU, DFE, OCT.  There are no Patient Instructions on file for this  visit.  This document serves as a record of services personally performed by Karie Chimera, MD, PhD. It was created on their behalf by Glee Arvin. Manson Passey, OA an ophthalmic technician. The creation of this record is the provider's dictation and/or activities during the visit.    Electronically signed by: Glee Arvin. Manson Passey, OA 08/01/23 1:25 PM  Karie Chimera, M.D., Ph.D. Diseases & Surgery of the Retina and Vitreous [facility] 08/01/2023   I have reviewed the above documentation for accuracy and completeness, and I agree with the above. Karie Chimera, M.D., Ph.D. 08/01/23 1:25 PM   Abbreviations: M myopia (nearsighted); A astigmatism; H hyperopia (farsighted); P presbyopia; Mrx spectacle prescription;  CTL contact lenses; OD right eye; OS left eye; OU both eyes  XT exotropia; ET esotropia; PEK punctate epithelial keratitis; PEE punctate epithelial erosions; DES dry eye syndrome; MGD meibomian gland dysfunction; ATs artificial tears; PFAT's preservative free artificial tears; NSC nuclear sclerotic cataract; PSC posterior subcapsular cataract; ERM epi-retinal membrane; PVD posterior vitreous detachment; RD retinal detachment; DM diabetes mellitus; DR diabetic retinopathy; NPDR non-proliferative diabetic retinopathy; PDR proliferative diabetic retinopathy; CSME clinically significant macular edema; DME diabetic macular edema; dbh dot blot hemorrhages; CWS cotton wool spot; POAG primary open angle glaucoma; C/D cup-to-disc ratio; HVF humphrey visual field; GVF goldmann visual field; OCT optical coherence tomography; IOP intraocular pressure; BRVO Branch retinal vein occlusion; CRVO central retinal vein occlusion; CRAO central retinal artery occlusion; BRAO branch retinal artery occlusion; RT retinal tear; SB scleral buckle; PPV pars plana vitrectomy; VH Vitreous hemorrhage; PRP panretinal laser photocoagulation; IVK intravitreal kenalog; VMT vitreomacular traction; MH Macular hole;   NVD neovascularization of the disc; NVE neovascularization elsewhere; AREDS age related eye disease study; ARMD age related macular degeneration; POAG primary open angle glaucoma; EBMD epithelial/anterior basement membrane dystrophy; ACIOL anterior chamber intraocular lens; IOL intraocular lens; PCIOL posterior chamber intraocular lens; Phaco/IOL phacoemulsification with intraocular lens placement; PRK photorefractive keratectomy; LASIK laser assisted in situ keratomileusis; HTN hypertension; DM diabetes mellitus; COPD chronic obstructive pulmonary disease

## 2023-08-01 ENCOUNTER — Ambulatory Visit (INDEPENDENT_AMBULATORY_CARE_PROVIDER_SITE_OTHER): Payer: Medicare Other | Admitting: Ophthalmology

## 2023-08-01 ENCOUNTER — Encounter (INDEPENDENT_AMBULATORY_CARE_PROVIDER_SITE_OTHER): Payer: Self-pay | Admitting: Ophthalmology

## 2023-08-01 DIAGNOSIS — Z794 Long term (current) use of insulin: Secondary | ICD-10-CM | POA: Diagnosis not present

## 2023-08-01 DIAGNOSIS — H3342 Traction detachment of retina, left eye: Secondary | ICD-10-CM

## 2023-08-01 DIAGNOSIS — I10 Essential (primary) hypertension: Secondary | ICD-10-CM | POA: Diagnosis not present

## 2023-08-01 DIAGNOSIS — H4313 Vitreous hemorrhage, bilateral: Secondary | ICD-10-CM

## 2023-08-01 DIAGNOSIS — H35033 Hypertensive retinopathy, bilateral: Secondary | ICD-10-CM

## 2023-08-01 DIAGNOSIS — Z7984 Long term (current) use of oral hypoglycemic drugs: Secondary | ICD-10-CM

## 2023-08-01 DIAGNOSIS — E113513 Type 2 diabetes mellitus with proliferative diabetic retinopathy with macular edema, bilateral: Secondary | ICD-10-CM | POA: Diagnosis not present

## 2023-08-01 DIAGNOSIS — H25813 Combined forms of age-related cataract, bilateral: Secondary | ICD-10-CM

## 2023-08-01 DIAGNOSIS — Z7985 Long-term (current) use of injectable non-insulin antidiabetic drugs: Secondary | ICD-10-CM

## 2023-08-01 DIAGNOSIS — E119 Type 2 diabetes mellitus without complications: Secondary | ICD-10-CM

## 2023-08-01 MED ORDER — PREDNISOLONE ACETATE 1 % OP SUSP: 1.0000 [drp] | Freq: Four times a day (QID) | OPHTHALMIC | 0 refills | Status: DC

## 2023-08-01 MED ORDER — BACITRACIN-POLYMYXIN B 500-10000 UNIT/GM OP OINT: TOPICAL_OINTMENT | Freq: Four times a day (QID) | OPHTHALMIC | 3 refills | Status: DC

## 2023-08-08 ENCOUNTER — Telehealth: Payer: Self-pay

## 2023-08-08 DIAGNOSIS — I739 Peripheral vascular disease, unspecified: Secondary | ICD-10-CM

## 2023-08-08 NOTE — Telephone Encounter (Signed)
Caller: Debra at [facility]  Concern: Requested an urgent appt d/t pt having necrotic infected toes. She states that the pt complains about them all the time. She also stated that she's never seen anything like it. Unclear as to how long he's been in this state. She stated that the RN came to her today to ask for an appt. The provider at the facility was going to send him to the ED if he couldn't get an appt quickly.  Pt no-showed his wound check appt on 07/23/23, but she was unsure why.  Location: [facility] foot  Resolution: Appointment scheduled for next available triage appt  Next Appt: Appointment scheduled for 08/09/23 @ 1200 for ABI and 1300 for PA

## 2023-08-09 ENCOUNTER — Ambulatory Visit (INDEPENDENT_AMBULATORY_CARE_PROVIDER_SITE_OTHER): Payer: Medicare Other | Admitting: Physician Assistant

## 2023-08-09 ENCOUNTER — Ambulatory Visit (HOSPITAL_COMMUNITY)
Admission: RE | Admit: 2023-08-09 | Discharge: 2023-08-09 | Disposition: A | Discharge status: Home / Self Care | Payer: Medicare Other | Source: Ambulatory Visit | Attending: Vascular Surgery | Admitting: Vascular Surgery

## 2023-08-09 VITALS — BP 131/82 | HR 95 | Temp 97.8°F | Ht 75.0 in | Wt 266.9 lb

## 2023-08-09 DIAGNOSIS — I739 Peripheral vascular disease, unspecified: Secondary | ICD-10-CM

## 2023-08-09 DIAGNOSIS — S91109A Unspecified open wound of unspecified toe(s) without damage to nail, initial encounter: Secondary | ICD-10-CM

## 2023-08-09 LAB — VAS US ABI WITH/WO TBI: Right ABI: 1.17

## 2023-08-09 NOTE — Progress Notes (Signed)
Office Note     CC:  follow up Requesting Provider:  Rehab, [facility]*  HPI: Brett White is a 62 y.o. (06-02-1961) male who was added to the clinic schedule as a triage patient due to new wounds of the right foot.  Surgical history significant for left below the knee amputation.  He is ambulatory with a prosthetic limb.  He states he is numb from the mid right lower leg down to the toe tips.  Hemoglobin A1c last recorded in September of last year was 10.8.  He underwent angiography by Dr. Chestine Spore on 06/20/2023 to evaluate circulation of the right lower extremity due to a great toe wound.  This was diagnostic in nature.  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  His peroneal artery is patent but small distally however the dominant runoff to the foot is the PTA.  Patient denies any trauma to his feet.  All toenails of the toes of the right foot have now fallen off.  He denies fevers, chills, nausea/vomiting.   Past Medical History:  Diagnosis Date   CAP (community acquired pneumonia) 12/09/2021   CKD (chronic kidney disease)    GERD (gastroesophageal reflux disease)    HTN (hypertension)    Insulin dependent type 2 diabetes mellitus (HCC)    Neuropathy    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   Stroke [facility])     Past Surgical History:  Procedure Laterality Date   ABDOMINAL AORTOGRAM W/LOWER EXTREMITY N/A 06/20/2023   Procedure: ABDOMINAL AORTOGRAM W/LOWER EXTREMITY;  Surgeon: Cephus Shelling, MD;  Location: [facility];  Service: Cardiovascular;  Laterality: N/A;   AMPUTATION Left 02/04/2021   Procedure: LEFT GREAT TOE AMPUTATION;  Surgeon: [name], MD;  Location[facility] OR;  Service: Orthopedics;  Laterality: Left;   AMPUTATION Left 02/10/2021   Procedure: LEFT FOOT 1ST RAY AMPUTATION;  Surgeon: [name], MD;  Location: [facility] OR;  Service: Orthopedics;  Laterality: Left;   AMPUTATION Left 03/22/2021   Procedure: LEFT BELOW KNEE  AMPUTATION;  Surgeon: [name], MD;  Location: [facility] OR;  Service: Orthopedics;  Laterality: Left;   APPENDECTOMY     BACK SURGERY     I & D EXTREMITY Left 03/03/2021   Procedure: LEFT FOOT DEBRIDEMENT;  Surgeon: [name], MD;  Location: [facility] OR;  Service: Orthopedics;  Laterality: Left;   INCISION AND DRAINAGE ABSCESS N/A 10/06/2021   Procedure: INCISION AND DRAINAGE BACK ABSCESS;  Surgeon: Berna Bue, MD;  Location: [facility] OR;  Service: General;  Laterality: N/A;   INJECTION OF SILICONE OIL Left 07/25/2023   Procedure: INJECTION OF SILICONE OIL;  Surgeon: Rennis Chris, MD;  Location: [facility] OR;  Service: Ophthalmology;  Laterality: Left;   MEMBRANE PEEL Left 07/25/2023   Procedure: MEMBRANE PEEL;  Surgeon: Rennis Chris, MD;  Location: [facility] OR;  Service: Ophthalmology;  Laterality: Left;   PARS PLANA VITRECTOMY Left 07/25/2023   Procedure: PARS PLANA VITRECTOMY WITH 25 GAUGE;  Surgeon: Rennis Chris, MD;  Location: [facility] OR;  Service: Ophthalmology;  Laterality: Left;   removal of back cyst     TONSILLECTOMY      Social History   Socioeconomic History   Marital status: Divorced    Spouse name: Not on file   Number of children: Not on file   Years of education: Not on file   Highest education level: Not on file  Occupational History   Not on file  Tobacco  Use   Smoking status: Never   Smokeless tobacco: Never  Vaping Use   Vaping status: Never Used  Substance and Sexual Activity   Alcohol use: Not Currently   Drug use: Not Currently   Sexual activity: Not Currently  Other Topics Concern   Not on file  Social History Narrative   Not on file   Social Determinants of Health   Financial Resource Strain: Not on file  Food Insecurity: No Food Insecurity (11/16/2022)   Hunger Vital Sign    Worried About Running Out of Food in the Last Year: Never true    Ran Out of Food in the Last Year: Never true  Transportation Needs: No Transportation Needs (11/16/2022)   PRAPARE -  Administrator, Civil Service (Medical): No    Lack of Transportation (Non-Medical): No  Physical Activity: Not on file  Stress: Not on file  Social Connections: Not on file  Intimate Partner Violence: Not At Risk (11/16/2022)   Humiliation, Afraid, Rape, and Kick questionnaire    Fear of Current or Ex-Partner: No    Emotionally Abused: No    Physically Abused: No    Sexually Abused: No    Family History  Problem Relation Age of Onset   Diabetes Mother    Heart attack Neg Hx     Current Outpatient Medications  Medication Sig Dispense Refill   acetaminophen (TYLENOL) 325 MG tablet Take 2 tablets (650 mg total) by mouth every 6 (six) hours as needed for mild pain (or Fever >/= 101).     amLODipine (NORVASC) 10 MG tablet Take 10 mg by mouth every morning.     atorvastatin (LIPITOR) 40 MG tablet Take 40 mg by mouth at bedtime.     bacitracin-polymyxin b (POLYSPORIN) ophthalmic ointment Place into the right eye 4 (four) times daily. Place a 1/2 inch ribbon of ointment into the lower eyelid 4 a day and as needed for discomfort 3.5 g 3   bisacodyl (DULCOLAX) 5 MG EC tablet Take 10 mg by mouth daily as needed (CONSTIPATION).     cyclobenzaprine (FLEXERIL) 5 MG tablet Take 1 tablet (5 mg total) by mouth 3 (three) times daily as needed for muscle spasms. 9 tablet 0   DULoxetine (CYMBALTA) 30 MG capsule Take 1 capsule (30 mg total) by mouth daily. (Patient taking differently: Take 60 mg by mouth daily.) 30 capsule 3   ergocalciferol (VITAMIN D2) 1.25 MG (50000 UT) capsule Take 50,000 Units by mouth once a week.     famotidine (PEPCID) 20 MG tablet Take 20 mg by mouth daily.     FARXIGA 10 MG TABS tablet Take 10 mg by mouth every morning. Dapaglifozin     furosemide (LASIX) 20 MG tablet Take 20 mg by mouth every morning.     glipiZIDE (GLUCOTROL) 10 MG tablet Take 10 mg by mouth every morning.     hydrocortisone cream 1 % Apply 1 Application topically daily. Apply to right lower leg  for wound care     hydrOXYzine (ATARAX) 10 MG tablet Take 1 tablet (10 mg total) by mouth 3 (three) times daily as needed for itching. 30 tablet 0   insulin lispro (HUMALOG) 100 UNIT/ML KwikPen Inject 2-8 Units into the skin 3 (three) times daily with meals. Sliding scale 201-250=2 units 251-300=4 units 301-350=6 units 351-400= 8 units 401 or greater than=call [facility]     LINZESS 145 MCG CAPS capsule Take 145 mcg by mouth daily.     losartan (  COZAAR) 50 MG tablet Take 1 tablet (50 mg total) by mouth daily.     magnesium hydroxide (MILK OF MAGNESIA) 400 MG/5ML suspension Take 30 mLs by mouth daily as needed for mild constipation.     Melatonin 5 MG CAPS Take 5 mg by mouth at bedtime.     naloxone (NARCAN) nasal spray 4 mg/0.1 mL Place 1 spray into the nose See admin instructions. Every 2-3 minutesas needed until patient response/ emba arrived. 1 each 0   oxyCODONE-acetaminophen (PERCOCET) 10-325 MG tablet Take 1 tablet by mouth every 4 (four) hours as needed for pain.     pantoprazole (PROTONIX) 40 MG tablet Take 1 tablet (40 mg total) by mouth daily. 30 tablet 3   polyethylene glycol (MIRALAX / GLYCOLAX) 17 g packet Take 17 g by mouth daily. (Patient taking differently: Take 17 g by mouth daily. With 4 oz of fluids and drink) 14 each 0   Polyethylene Glycol 3350 POWD Take 17 g by mouth every morning. Mix with 4 oz of fluids     prednisoLONE acetate (PRED FORTE) 1 % ophthalmic suspension Place 1 drop into the left eye 4 (four) times daily. 15 mL 0   pregabalin (LYRICA) 100 MG capsule Take 1 capsule (100 mg total) by mouth 2 (two) times daily. (Patient taking differently: Take 100 mg by mouth 4 (four) times daily.) 60 capsule 0   sertraline (ZOLOFT) 50 MG tablet Take 50 mg by mouth daily.     SODIUM PHOSPHATES RE Place 1 application  rectally daily. As needed constipation 3 of 4     traZODone (DESYREL) 150 MG tablet Take 75 mg by mouth at bedtime.     TRULICITY 1.5 MG/0.5ML SOPN Inject 0.5 mg into  the skin once a week. Tuesday     No current facility-administered medications for this visit.    No Known Allergies   REVIEW OF SYSTEMS:   [X]  denotes positive finding, [ ]  denotes negative finding Cardiac  Comments:  Chest pain or chest pressure:    Shortness of breath upon exertion:    Short of breath when lying flat:    Irregular heart rhythm:        Vascular    Pain in calf, thigh, or hip brought on by ambulation:    Pain in feet at night that wakes you up from your sleep:     Blood clot in your veins:    Leg swelling:         Pulmonary    Oxygen at home:    Productive cough:     Wheezing:         Neurologic    Sudden weakness in arms or legs:     Sudden numbness in arms or legs:     Sudden onset of difficulty speaking or slurred speech:    Temporary loss of vision in one eye:     Problems with dizziness:         Gastrointestinal    Blood in stool:     Vomited blood:         Genitourinary    Burning when urinating:     Blood in urine:        Psychiatric    Major depression:         Hematologic    Bleeding problems:    Problems with blood clotting too easily:        Skin    Rashes or ulcers:  Constitutional    Fever or chills:      PHYSICAL EXAMINATION:  Vitals:   08/09/23 1237  BP: 131/82  Pulse: 95  Temp: 97.8 F (36.6 C)  SpO2: 92%  Weight: 266 lb 14.4 oz (121.1 kg)  Height: 6\' 3"  (1.905 m)    General:  WDWN in NAD; vital signs documented above Gait: Not observed HENT: WNL, normocephalic Pulmonary: normal non-labored breathing , without Rales, rhonchi,  wheezing Cardiac: regular HR Abdomen: soft, NT, no masses Skin: without rashes Vascular Exam/Pulses: 2+ palpable right PT Extremities: Wounds of all toe tips pictured below Musculoskeletal: no muscle wasting or atrophy  Neurologic: A&O X 3 Psychiatric:  The pt has Normal affect.      ASSESSMENT/PLAN:: 62 y.o. male here for evaluation of wounds of all toe tips of the  right foot  Brett White is a 62 year old male who previously underwent diagnostic angiography of the right lower extremity demonstrating patent PT and peroneal artery and occluded distal ATA.  Tibial disease is likely related to uncontrolled diabetes mellitus as is the insensate foot.  The patient is considered optimized from a vascular surgery standpoint.  He unfortunately has developed more wounds of the toes of his right foot.  On exam he still has a palpable PT pulse.  Recommendations included daily cleansing with antibacterial soap and water.  He can paint the dry eschar with Betadine.  He will be referred to podiatry for further wound care.  We also discussed better glucose control.  He will follow-up with [place] with ABIs in 6 months.   Emilie Rutter, PA-C [facility] [phone]  Clinic MD:   Hetty Blend on call

## 2023-08-15 NOTE — Progress Notes (Signed)
[facility] - Clinic Note  08/16/2023   CHIEF COMPLAINT Patient presents for Retina Follow Up  HISTORY OF PRESENT ILLNESS: Brett White is a 62 y.o. male who presents to the clinic today for:  HPI     Retina Follow Up   Patient presents with  Diabetic Retinopathy.  In both eyes.  This started 15 days ago.  I, the attending physician,  performed the HPI with the patient and updated documentation appropriately.        Comments   Patient here for 15 days retina follow up for PDR OU. Patient states vision not good. Still can't see. Been sleeping on side and on the foam face thing. OS throbs. Been getting drops. Out of some eye drops. Not sure which ones.      Last edited by Rennis Chris, MD on 08/16/2023  2:47 PM.    Pt states he is seeing more color now, he is using the drops as directed, but has ran out of some  Referring physician: Diona Foley, MD [address] [place],  [place] [address]  HISTORICAL INFORMATION:  Selected notes from the MEDICAL RECORD NUMBER Referred by Dr. Zenaida Niece for PDR w/ [facility] OU LEE:  Ocular Hx- PMH-   CURRENT MEDICATIONS: Current Outpatient Medications (Ophthalmic Drugs)  Medication Sig   bacitracin-polymyxin b (POLYSPORIN) ophthalmic ointment Place into the right eye 4 (four) times daily. Place a 1/2 inch ribbon of ointment into the lower eyelid 4 a day and as needed for discomfort   prednisoLONE acetate (PRED FORTE) 1 % ophthalmic suspension Place 1 drop into the left eye 4 (four) times daily.   No current facility-administered medications for this visit. (Ophthalmic Drugs)   Current Outpatient Medications (Other)  Medication Sig   acetaminophen (TYLENOL) 325 MG tablet Take 2 tablets (650 mg total) by mouth every 6 (six) hours as needed for mild pain (or Fever >/= 101).   amLODipine (NORVASC) 10 MG tablet Take 10 mg by mouth every morning.   atorvastatin (LIPITOR) 40 MG tablet Take 40 mg by mouth at bedtime.   bisacodyl  (DULCOLAX) 5 MG EC tablet Take 10 mg by mouth daily as needed (CONSTIPATION).   cyclobenzaprine (FLEXERIL) 5 MG tablet Take 1 tablet (5 mg total) by mouth 3 (three) times daily as needed for muscle spasms.   DULoxetine (CYMBALTA) 30 MG capsule Take 1 capsule (30 mg total) by mouth daily. (Patient taking differently: Take 60 mg by mouth daily.)   ergocalciferol (VITAMIN D2) 1.25 MG (50000 UT) capsule Take 50,000 Units by mouth once a week.   famotidine (PEPCID) 20 MG tablet Take 20 mg by mouth daily.   FARXIGA 10 MG TABS tablet Take 10 mg by mouth every morning. Dapaglifozin   furosemide (LASIX) 20 MG tablet Take 20 mg by mouth every morning.   glipiZIDE (GLUCOTROL) 10 MG tablet Take 10 mg by mouth every morning.   hydrocortisone cream 1 % Apply 1 Application topically daily. Apply to right lower leg for wound care   hydrOXYzine (ATARAX) 10 MG tablet Take 1 tablet (10 mg total) by mouth 3 (three) times daily as needed for itching.   insulin lispro (HUMALOG) 100 UNIT/ML KwikPen Inject 2-8 Units into the skin 3 (three) times daily with meals. Sliding scale 201-250=2 units 251-300=4 units 301-350=6 units 351-400= 8 units 401 or greater than=call [facility]   LINZESS 145 MCG CAPS capsule Take 145 mcg by mouth daily.   losartan (COZAAR) 50 MG tablet Take 1  tablet (50 mg total) by mouth daily.   magnesium hydroxide (MILK OF MAGNESIA) 400 MG/5ML suspension Take 30 mLs by mouth daily as needed for mild constipation.   Melatonin 5 MG CAPS Take 5 mg by mouth at bedtime.   naloxone (NARCAN) nasal spray 4 mg/0.1 mL Place 1 spray into the nose See admin instructions. Every 2-3 minutesas needed until patient response/ emba arrived.   oxyCODONE-acetaminophen (PERCOCET) 10-325 MG tablet Take 1 tablet by mouth every 4 (four) hours as needed for pain.   pantoprazole (PROTONIX) 40 MG tablet Take 1 tablet (40 mg total) by mouth daily.   polyethylene glycol (MIRALAX / GLYCOLAX) 17 g packet Take 17 g by mouth daily.  (Patient taking differently: Take 17 g by mouth daily. With 4 oz of fluids and drink)   Polyethylene Glycol 3350 POWD Take 17 g by mouth every morning. Mix with 4 oz of fluids   pregabalin (LYRICA) 100 MG capsule Take 1 capsule (100 mg total) by mouth 2 (two) times daily. (Patient taking differently: Take 100 mg by mouth 4 (four) times daily.)   sertraline (ZOLOFT) 50 MG tablet Take 50 mg by mouth daily.   SODIUM PHOSPHATES RE Place 1 application  rectally daily. As needed constipation 3 of 4   traZODone (DESYREL) 150 MG tablet Take 75 mg by mouth at bedtime.   TRULICITY 1.5 MG/0.5ML SOPN Inject 0.5 mg into the skin once a week. Tuesday   doxycycline (VIBRA-TABS) 100 MG tablet Take 1 tablet (100 mg total) by mouth 2 (two) times daily for 14 days.   No current facility-administered medications for this visit. (Other)   REVIEW OF SYSTEMS: ROS   Positive for: Musculoskeletal, Endocrine, Cardiovascular, Eyes Negative for: Constitutional, Gastrointestinal, Neurological, Skin, Genitourinary, HENT, Respiratory, Psychiatric, Allergic/Imm, Heme/Lymph Last edited by Laddie Aquas, COA on 08/16/2023  9:11 AM.     ALLERGIES No Known Allergies  PAST MEDICAL HISTORY Past Medical History:  Diagnosis Date   CAP (community acquired pneumonia) 12/09/2021   CKD (chronic kidney disease)    GERD (gastroesophageal reflux disease)    HTN (hypertension)    Insulin dependent type 2 diabetes mellitus (HCC)    Neuropathy    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   Stroke [facility])    Past Surgical History:  Procedure Laterality Date   ABDOMINAL AORTOGRAM W/LOWER EXTREMITY N/A 06/20/2023   Procedure: ABDOMINAL AORTOGRAM W/LOWER EXTREMITY;  Surgeon: Cephus Shelling, MD;  Location: [facility] INVASIVE [facility];  Service: Cardiovascular;  Laterality: N/A;   AMPUTATION Left 02/04/2021   Procedure: LEFT GREAT TOE AMPUTATION;  Surgeon: [name], MD;  Location: [facility] OR;  Service: Orthopedics;   Laterality: Left;   AMPUTATION Left 02/10/2021   Procedure: LEFT FOOT 1ST RAY AMPUTATION;  Surgeon: [name], MD;  Location: [facility] OR;  Service: Orthopedics;  Laterality: Left;   AMPUTATION Left 03/22/2021   Procedure: LEFT BELOW KNEE AMPUTATION;  Surgeon: [name], MD;  Location: [facility] OR;  Service: Orthopedics;  Laterality: Left;   APPENDECTOMY     BACK SURGERY     I & D EXTREMITY Left 03/03/2021   Procedure: LEFT FOOT DEBRIDEMENT;  Surgeon: [name], MD;  Location: [facility] OR;  Service: Orthopedics;  Laterality: Left;   INCISION AND DRAINAGE ABSCESS N/A 10/06/2021   Procedure: INCISION AND DRAINAGE BACK ABSCESS;  Surgeon: Berna Bue, MD;  Location: [facility] OR;  Service: General;  Laterality: N/A;   INJECTION OF SILICONE OIL Left 07/25/2023  Procedure: INJECTION OF SILICONE OIL;  Surgeon: Rennis Chris, MD;  Location: [facility] OR;  Service: Ophthalmology;  Laterality: Left;   MEMBRANE PEEL Left 07/25/2023   Procedure: MEMBRANE PEEL;  Surgeon: Rennis Chris, MD;  Location: [facility] OR;  Service: Ophthalmology;  Laterality: Left;   PARS PLANA VITRECTOMY Left 07/25/2023   Procedure: PARS PLANA VITRECTOMY WITH 25 GAUGE;  Surgeon: Rennis Chris, MD;  Location: [facility] OR;  Service: Ophthalmology;  Laterality: Left;   removal of back cyst     TONSILLECTOMY     FAMILY HISTORY Family History  Problem Relation Age of Onset   Diabetes Mother    Heart attack Neg Hx    SOCIAL HISTORY Social History   Tobacco Use   Smoking status: Never   Smokeless tobacco: Never  Vaping Use   Vaping status: Never Used  Substance Use Topics   Alcohol use: Not Currently   Drug use: Not Currently       OPHTHALMIC EXAM:  Base Eye Exam     Visual Acuity (Snellen - Linear)       Right Left   Dist [place] CF at 3' CF at 3'   Dist ph [place] NI NI         Tonometry (Tonopen, 9:07 AM)       Right Left   Pressure 16 15         Pupils       Dark Light Shape React APD   Right 3 2 Round Brisk None   Left 6 6  Round NR None         Visual Fields       Left Right   Restrictions Total superior temporal, inferior temporal, superior nasal, inferior nasal deficiencies Total superior temporal, inferior temporal, superior nasal, inferior nasal deficiencies         Extraocular Movement       Right Left    Full, Ortho Full, Ortho         Neuro/Psych     Oriented x3: Yes   Mood/Affect: Normal         Dilation     Both eyes: 1.0% Mydriacyl, 2.5% Phenylephrine @ 9:06 AM           Slit Lamp and Fundus Exam     External Exam       Right Left   External Normal Normal         Slit Lamp Exam       Right Left   Lids/Lashes Dermatochalasis - upper lid Dermatochalasis - upper lid   Conjunctiva/Sclera White and quiet sutures dissolving   Cornea 1-2+ pigmented guttata trace PEE, trace tear film debris   Anterior Chamber Deep and quiet moderate depth, narrow angles   Iris Round and reactive Round and dilated, No NVI   Lens 2+ Nuclear sclerosis, 2-3+ Cortical cataract, 2-3+ Posterior subcapsular cataract 2-3+ Nuclear sclerosis, 2-3+ Cortical cataract, trace PSC   Anterior Vitreous Vitreous syneresis, Posterior vitreous detachment, blood stained Vitreous condensations clearing and settling inferiorly post vitrectomy, good silicone oil fill         Fundus Exam       Right Left   Disc Pink and sharp, no NVD Pink and Sharp   C/D Ratio 0.5 0.3   Macula Hazy view, Flat, Blunted foveal reflex, scattered MA/DBH, +edema central and temporal macula flat under oil, scattered MA/DBH, +edema   Vessels attenuated, Tortuous attenuated, mild tortuosity   Periphery Attached, scattered MA/DBH, inferior retina obsured VH /  blood clots Inferonasal retina reattached under oil; 360 PRP laser changes, PRE-OP: 360 MA/DBH, focal tractional RD IN quad -- focal tractional fibrosis / NVE along IN arcades - SRF extends posteriorly to IN disc           IMAGING AND PROCEDURES  Imaging and Procedures  for 08/16/2023  DG Foot Complete Right       Please see detailed radiograph report in office note.     OCT, Retina - OU - Both Eyes       Right Eye Quality was poor. Central Foveal Thickness: 348. Findings include no SRF, abnormal foveal contour, epiretinal membrane, intraretinal fluid (+central edema greatest temporal fovea and macula ).   Left Eye Quality was good. Central Foveal Thickness: 503. Progression has worsened. Findings include no SRF, abnormal foveal contour, intraretinal hyper-reflective material (Interval increase in IRF / edema temporal and superior fovea and mac under silicone oil).   Notes *Images captured and stored on drive  Diagnosis / Impression:  OD: +central edema greatest temporal fovea and macula  OS: Interval increase in IRF / edema temporal and superior fovea and mac under silicone oil  Clinical management:  See below  Abbreviations: NFP - Normal foveal profile. CME - cystoid macular edema. PED - pigment epithelial detachment. IRF - intraretinal fluid. SRF - subretinal fluid. EZ - ellipsoid zone. ERM - epiretinal membrane. ORA - outer retinal atrophy. ORT - outer retinal tubulation. SRHM - subretinal hyper-reflective material. IRHM - intraretinal hyper-reflective material      Intravitreal Injection, Pharmacologic Agent - OD - Right Eye       Time Out 08/16/2023. 10:30 AM. Confirmed correct patient, procedure, site, and patient consented.   Anesthesia Topical anesthesia was used. Anesthetic medications included Lidocaine 2%, Proparacaine 0.5%.   Procedure Preparation included 5% betadine to ocular surface, eyelid speculum. A (32g) needle was used.   Injection: 1.25 mg Bevacizumab 1.25mg /0.66ml   Route: Intravitreal, Site: Right Eye   NDC: P3213405, Lot: [id number], Expiration date: 11/18/2023   Post-op Post injection exam found visual acuity of at least counting fingers. The patient tolerated the procedure well. There were no  complications. The patient received written and verbal post procedure care education.      Intravitreal Injection, Pharmacologic Agent - OS - Left Eye       Time Out 08/16/2023. 10:31 AM. Confirmed correct patient, procedure, site, and patient consented.   Anesthesia Topical anesthesia was used. Anesthetic medications included Lidocaine 2%, Proparacaine 0.5%.   Procedure Preparation included 5% betadine to ocular surface, eyelid speculum. A (32g) needle was used.   Injection: 1.25 mg Bevacizumab 1.25mg /0.51ml   Route: Intravitreal, Site: Left Eye   NDC: P3213405, Lot: [id number], Expiration date: 11/24/2023   Post-op Post injection exam found visual acuity of at least counting fingers. The patient tolerated the procedure well. There were no complications. The patient received written and verbal post procedure care education.   Notes An AC tap was performed following injection due to elevated IOP using a 30 gauge needle on a syringe with the plunger removed. The needle was placed at the limbus at 5 oclock and approximately 0.05cc of aqueous was removed from the anterior chamber. Betadine was applied to the tap area before and after the paracentesis was performed. There were no complications. The patient tolerated the procedure well. The IOP was rechecked and was found to be ~10 mmHg by digital palpation.           ASSESSMENT/PLAN:   ICD-10-CM  1. 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 Diabetes mellitus treated with oral medication (HCC)  E11.9    Z79.84     3. Encounter for long-term (current) use of insulin (HCC)  Z79.4     4. Diabetes mellitus treated  with injections of non-insulin medication (HCC)  E11.9    Z79.85     5. Vitreous hemorrhage of both eyes (HCC)  H43.13     6. Retinal detachment, tractional, left eye  H33.42     7. Essential hypertension  I10     8. Hypertensive retinopathy of both eyes  H35.033     9. Combined forms of age-related cataract of both eyes  H25.813       1-5. 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, OU (OD > OS) - last A1C 10.8 (09.12.23) - s/p IVA OD #1 (10.16.24) - s/p IVA OS #1 (10.18.24) - BCVA OD CF; OS HM - exam shows diffuse VH OU; OS with TRD inf nasal periphery - OCT shows: OD: +central edema greatest temporal fovea and macula; OS: Interval increase in IRF / edema temporal and superior fovea and mac under silicone oil - FA (10.16.24) OD: Diffuse blockage causing poor images,  no obvious NV, scattered patches of vascular non perfusion; OS: Focal patch of NVE IN midzone, scattered patches of vascular  non profusion greatest IN periphery  - recommend IVA OU #2 today, 11.15.24 - pt wishes to proceed with injection - RBA of procedure discussed, questions answered - IVA informed consent obtained and signed, 10.16.24 (OU) - see procedure note - s/p POW3 s/p PPV/MP/PFO/EL/SO OS, 10.24.2024 - doing well  - retina attached and in good position under oil -- good oil fill - IOP 15 - cont PF 4x/day OS  Brimonidine BID OS PSO ung QID OS -- decrease to QHS - post op drop and positioning instructions reviewed  - tylenol/ibuprofen for pain  - f/u 4 weeks, DFE, OCT  6. Traction retinal detachment, OS - focal traction RD IN quad--focal traction fibrosis along IN arcades, SRF extended IN disc - s/p PPV/MP/PFO/EL/SO OS, 10.24.2024 -- see above - retina reattached under oil - drops as above   7,8. Hypertensive retinopathy OU - discussed importance of tight BP control - monitor   9. Mixed Cataract OU, OD>OS - The symptoms of cataract, surgical options, and treatments and risks were discussed with  patient. - OD with large central PSC - discussed diagnosis and progression - under expert care of Dr. Zenaida Niece -- surgery scheduled for December 17th - monitor   Ophthalmic Meds Ordered this visit:  Meds ordered this encounter  Medications   Bevacizumab (AVASTIN) SOLN 1.25 mg   Bevacizumab (AVASTIN) SOLN 1.25 mg     Return in about 4 weeks (around 09/13/2023) for f/u PDR OU, DFE, OCT.  There are no Patient Instructions on file for this visit.  This document serves as a record of services personally performed by Karie Chimera, MD, PhD. It was created on their behalf by Berlin Hun COT, an ophthalmic technician. The creation of this record is the provider's dictation and/or activities during the visit.    Electronically signed  by: Berlin Hun COT 11.14.24 1:16 AM  This document serves as a record of services personally performed by Karie Chimera, MD, PhD. It was created on their behalf by Glee Arvin. Manson Passey, OA an ophthalmic technician. The creation of this record is the provider's dictation and/or activities during the visit.    Electronically signed by: Glee Arvin. Manson Passey, OA 08/18/23 1:16 AM  Karie Chimera, M.D., Ph.D. Diseases & Surgery of the Retina and Vitreous [facility] 08/16/2023   I have reviewed the above documentation for accuracy and completeness, and I agree with the above. Karie Chimera, M.D., Ph.D. 08/18/23 1:18 AM  Abbreviations: M myopia (nearsighted); A astigmatism; H hyperopia (farsighted); P presbyopia; Mrx spectacle prescription;  CTL contact lenses; OD right eye; OS left eye; OU both eyes  XT exotropia; ET esotropia; PEK punctate epithelial keratitis; PEE punctate epithelial erosions; DES dry eye syndrome; MGD meibomian gland dysfunction; ATs artificial tears; PFAT's preservative free artificial tears; NSC nuclear sclerotic cataract; PSC posterior subcapsular cataract; ERM epi-retinal membrane; PVD posterior vitreous  detachment; RD retinal detachment; DM diabetes mellitus; DR diabetic retinopathy; NPDR non-proliferative diabetic retinopathy; PDR proliferative diabetic retinopathy; CSME clinically significant macular edema; DME diabetic macular edema; dbh dot blot hemorrhages; CWS cotton wool spot; POAG primary open angle glaucoma; C/D cup-to-disc ratio; HVF humphrey visual field; GVF goldmann visual field; OCT optical coherence tomography; IOP intraocular pressure; BRVO Branch retinal vein occlusion; CRVO central retinal vein occlusion; CRAO central retinal artery occlusion; BRAO branch retinal artery occlusion; RT retinal tear; SB scleral buckle; PPV pars plana vitrectomy; VH Vitreous hemorrhage; PRP panretinal laser photocoagulation; IVK intravitreal kenalog; VMT vitreomacular traction; MH Macular hole;  NVD neovascularization of the disc; NVE neovascularization elsewhere; AREDS age related eye disease study; ARMD age related macular degeneration; POAG primary open angle glaucoma; EBMD epithelial/anterior basement membrane dystrophy; ACIOL anterior chamber intraocular lens; IOL intraocular lens; PCIOL posterior chamber intraocular lens; Phaco/IOL phacoemulsification with intraocular lens placement; PRK photorefractive keratectomy; LASIK laser assisted in situ keratomileusis; HTN hypertension; DM diabetes mellitus; COPD chronic obstructive pulmonary disease

## 2023-08-16 ENCOUNTER — Ambulatory Visit (INDEPENDENT_AMBULATORY_CARE_PROVIDER_SITE_OTHER): Payer: Medicare Other

## 2023-08-16 ENCOUNTER — Ambulatory Visit (INDEPENDENT_AMBULATORY_CARE_PROVIDER_SITE_OTHER): Payer: Medicare Other | Admitting: Podiatry

## 2023-08-16 ENCOUNTER — Encounter: Payer: Self-pay | Admitting: Podiatry

## 2023-08-16 ENCOUNTER — Encounter (INDEPENDENT_AMBULATORY_CARE_PROVIDER_SITE_OTHER): Payer: Self-pay | Admitting: Ophthalmology

## 2023-08-16 ENCOUNTER — Ambulatory Visit (INDEPENDENT_AMBULATORY_CARE_PROVIDER_SITE_OTHER): Payer: Medicare Other | Admitting: Ophthalmology

## 2023-08-16 DIAGNOSIS — Z7985 Long-term (current) use of injectable non-insulin antidiabetic drugs: Secondary | ICD-10-CM

## 2023-08-16 DIAGNOSIS — E113513 Type 2 diabetes mellitus with proliferative diabetic retinopathy with macular edema, bilateral: Secondary | ICD-10-CM | POA: Diagnosis not present

## 2023-08-16 DIAGNOSIS — I739 Peripheral vascular disease, unspecified: Secondary | ICD-10-CM | POA: Diagnosis not present

## 2023-08-16 DIAGNOSIS — H35033 Hypertensive retinopathy, bilateral: Secondary | ICD-10-CM

## 2023-08-16 DIAGNOSIS — E119 Type 2 diabetes mellitus without complications: Secondary | ICD-10-CM

## 2023-08-16 DIAGNOSIS — H3342 Traction detachment of retina, left eye: Secondary | ICD-10-CM

## 2023-08-16 DIAGNOSIS — L03031 Cellulitis of right toe: Secondary | ICD-10-CM

## 2023-08-16 DIAGNOSIS — E1165 Type 2 diabetes mellitus with hyperglycemia: Secondary | ICD-10-CM

## 2023-08-16 DIAGNOSIS — Z7984 Long term (current) use of oral hypoglycemic drugs: Secondary | ICD-10-CM

## 2023-08-16 DIAGNOSIS — I10 Essential (primary) hypertension: Secondary | ICD-10-CM

## 2023-08-16 DIAGNOSIS — H25813 Combined forms of age-related cataract, bilateral: Secondary | ICD-10-CM

## 2023-08-16 DIAGNOSIS — H4313 Vitreous hemorrhage, bilateral: Secondary | ICD-10-CM

## 2023-08-16 DIAGNOSIS — Z794 Long term (current) use of insulin: Secondary | ICD-10-CM | POA: Diagnosis not present

## 2023-08-16 MED ORDER — BEVACIZUMAB CHEMO INJECTION 1.25MG/0.05ML SYRINGE FOR KALEIDOSCOPE
1.2500 mg | INTRAVITREAL | Status: AC | PRN
  Administered 2023-08-16: 1.25 mg via INTRAVITREAL

## 2023-08-16 MED ORDER — DOXYCYCLINE HYCLATE 100 MG PO TABS: 100.0000 mg | ORAL_TABLET | Freq: Two times a day (BID) | ORAL | 0 refills | Status: AC

## 2023-08-18 NOTE — Progress Notes (Signed)
Chief Complaint  Patient presents with   Foot Pain    RT foot has no feeling from middle of leg down for 6 months.  Has lesions on leg, facility where lives has been treating lesions.  DM II, sugars are controlled    HPI: 62 y.o. male presents today as referral from [facility].  Patient states that about a week ago the toenails of his right foot fell off.  He does have scabs over the nail beds currently.  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  He recently underwent angiography 06/20/2023 which determined occluded ATA however was noted to have patent peroneal artery and dominant runoff to the foot from his PT artery.  His diabetes is not well-controlled, last A1c about 9.  Patient denies any nausea, vomiting, fever, chills, chest pain, shortness of breath.  Past Medical History:  Diagnosis Date   CAP (community acquired pneumonia) 12/09/2021   CKD (chronic kidney disease)    GERD (gastroesophageal reflux disease)    HTN (hypertension)    Insulin dependent type 2 diabetes mellitus (HCC)    Neuropathy    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   Stroke [facility])     Past Surgical History:  Procedure Laterality Date   ABDOMINAL AORTOGRAM W/LOWER EXTREMITY N/A 06/20/2023   Procedure: ABDOMINAL AORTOGRAM W/LOWER EXTREMITY;  Surgeon: Cephus Shelling, MD;  [facility];  Service: Cardiovascular;  Laterality: N/A;   AMPUTATION Left 02/04/2021   Procedure: LEFT GREAT TOE AMPUTATION;  Surgeon: [name], MD;  Location: [facility] OR;  Service: Orthopedics;  Laterality: Left;   AMPUTATION Left 02/10/2021   Procedure: LEFT FOOT 1ST RAY AMPUTATION;  Surgeon: [name], MD;  Location: [facility] OR;  Service: Orthopedics;  Laterality: Left;   AMPUTATION Left 03/22/2021   Procedure: LEFT BELOW KNEE AMPUTATION;  Surgeon: [name], MD;  Location: [facility] OR;  Service: Orthopedics;  Laterality: Left;    APPENDECTOMY     BACK SURGERY     I & D EXTREMITY Left 03/03/2021   Procedure: LEFT FOOT DEBRIDEMENT;  Surgeon: [name], MD;  Location: [facility] OR;  Service: Orthopedics;  Laterality: Left;   INCISION AND DRAINAGE ABSCESS N/A 10/06/2021   Procedure: INCISION AND DRAINAGE BACK ABSCESS;  Surgeon: Berna Bue, MD;  Location: [facility] OR;  Service: General;  Laterality: N/A;   INJECTION OF SILICONE OIL Left 07/25/2023   Procedure: INJECTION OF SILICONE OIL;  Surgeon: Rennis Chris, MD;  Location: [facility] OR;  Service: Ophthalmology;  Laterality: Left;   MEMBRANE PEEL Left 07/25/2023   Procedure: MEMBRANE PEEL;  Surgeon: Rennis Chris, MD;  Location: [facility] OR;  Service: Ophthalmology;  Laterality: Left;   PARS PLANA VITRECTOMY Left 07/25/2023   Procedure: PARS PLANA VITRECTOMY WITH 25 GAUGE;  Surgeon: Rennis Chris, MD;  Location: [facility] OR;  Service: Ophthalmology;  Laterality: Left;   removal of back cyst     TONSILLECTOMY      No Known Allergies  ROS negative except as stated in HPI   Physical Exam: There were no vitals filed for this visit.  General: The patient is alert and oriented x3 in no acute distress.  Dermatology: Well adhered stable scabs appreciated to the nailbeds of the right foot 1 through 5.  Mild surrounding erythema is present to these areas.  Minimal edema.  No fluctuance or malodor appreciated.  No drainage.  Does have some scabbed over abrasions to the right leg.  Vascular: Nonpalpable DP pulse right foot.  Palpable PT pulse.  Capillary refill time about 3 seconds to the toes.  Pedal hair growth absent.  No cyanosis or pallor.  Neurological: Protective sensation absent, light touch sensation diminished right foot  Musculoskeletal Exam: History of left sided below-knee amputation  Radiographic Exam:  Normal osseous mineralization. Joint spaces preserved.  No fractures or osseous irregularities noted.  No osteolysis or cortical erosions appreciated to the  digits.  Assessment/Plan of Care: 1. Uncontrolled type 2 diabetes mellitus with hyperglycemia (HCC)   2. Cellulitis of all toes of right foot   3. PAD (peripheral artery disease) (HCC)      Meds ordered this encounter  Medications   doxycycline (VIBRA-TABS) 100 MG tablet    Sig: Take 1 tablet (100 mg total) by mouth 2 (two) times daily for 14 days.    Dispense:  28 tablet    Refill:  0   None  Discussed clinical findings with patient today.  # Cellulitis to the toes of the right foot, complicated by uncontrolled diabetes, PAD -Toes 1 through 5 pain with Betadine.  Instructions written for facility to apply Betadine twice a day to keep scab to the toes stable. -Starting patient on course of oral doxycycline twice a day for 14 days. -Discussed importance of tight glucose control, discussed that improving A1c will be helpful for wound healing and fighting infection -No signs of underlying osteomyelitis on radiographs -Follow-up in 2 weeks for reevaluation.  Discussed signs and symptoms of worsening infection such as worsening redness, swelling, drainage, development of systemic symptoms such as nausea, vomiting, fever, chills and to contact office or go to emergency room if these occur.   Arelene Moroni L. Marchia Bond, AACFAS [facility]     [address] [place], [place] [address]                Office [phone]  Fax [phone]

## 2023-08-23 ENCOUNTER — Other Ambulatory Visit: Payer: Self-pay

## 2023-08-23 DIAGNOSIS — I739 Peripheral vascular disease, unspecified: Secondary | ICD-10-CM

## 2023-09-05 ENCOUNTER — Ambulatory Visit: Payer: Medicare Other | Admitting: Podiatry

## 2023-09-12 NOTE — Progress Notes (Shared)
[facility] - Clinic Note  09/13/2023   CHIEF COMPLAINT Patient presents for No chief complaint on file.  HISTORY OF PRESENT ILLNESS: Brett White is a 62 y.o. male who presents to the clinic today for:   Pt states he is seeing more color now, he is using the drops as directed, but has ran out of some  Referring physician: Diona Foley, MD [address] [place],  [place] [address]  HISTORICAL INFORMATION:  Selected notes from the MEDICAL RECORD NUMBER Referred by Dr. Zenaida Niece for PDR w/ [facility] OU LEE:  Ocular Hx- PMH-   CURRENT MEDICATIONS: Current Outpatient Medications (Ophthalmic Drugs)  Medication Sig   bacitracin-polymyxin b (POLYSPORIN) ophthalmic ointment Place into the right eye 4 (four) times daily. Place a 1/2 inch ribbon of ointment into the lower eyelid 4 a day and as needed for discomfort   prednisoLONE acetate (PRED FORTE) 1 % ophthalmic suspension Place 1 drop into the left eye 4 (four) times daily.   No current facility-administered medications for this visit. (Ophthalmic Drugs)   Current Outpatient Medications (Other)  Medication Sig   acetaminophen (TYLENOL) 325 MG tablet Take 2 tablets (650 mg total) by mouth every 6 (six) hours as needed for mild pain (or Fever >/= 101).   amLODipine (NORVASC) 10 MG tablet Take 10 mg by mouth every morning.   atorvastatin (LIPITOR) 40 MG tablet Take 40 mg by mouth at bedtime.   bisacodyl (DULCOLAX) 5 MG EC tablet Take 10 mg by mouth daily as needed (CONSTIPATION).   cyclobenzaprine (FLEXERIL) 5 MG tablet Take 1 tablet (5 mg total) by mouth 3 (three) times daily as needed for muscle spasms.   DULoxetine (CYMBALTA) 30 MG capsule Take 1 capsule (30 mg total) by mouth daily. (Patient taking differently: Take 60 mg by mouth daily.)   ergocalciferol (VITAMIN D2) 1.25 MG (50000 UT) capsule Take 50,000 Units by mouth once a week.   famotidine (PEPCID) 20 MG tablet Take 20 mg by mouth daily.   FARXIGA 10 MG TABS  tablet Take 10 mg by mouth every morning. Dapaglifozin   furosemide (LASIX) 20 MG tablet Take 20 mg by mouth every morning.   glipiZIDE (GLUCOTROL) 10 MG tablet Take 10 mg by mouth every morning.   hydrocortisone cream 1 % Apply 1 Application topically daily. Apply to right lower leg for wound care   hydrOXYzine (ATARAX) 10 MG tablet Take 1 tablet (10 mg total) by mouth 3 (three) times daily as needed for itching.   insulin lispro (HUMALOG) 100 UNIT/ML KwikPen Inject 2-8 Units into the skin 3 (three) times daily with meals. Sliding scale 201-250=2 units 251-300=4 units 301-350=6 units 351-400= 8 units 401 or greater than=call [facility]   LINZESS 145 MCG CAPS capsule Take 145 mcg by mouth daily.   losartan (COZAAR) 50 MG tablet Take 1 tablet (50 mg total) by mouth daily.   magnesium hydroxide (MILK OF MAGNESIA) 400 MG/5ML suspension Take 30 mLs by mouth daily as needed for mild constipation.   Melatonin 5 MG CAPS Take 5 mg by mouth at bedtime.   naloxone (NARCAN) nasal spray 4 mg/0.1 mL Place 1 spray into the nose See admin instructions. Every 2-3 minutesas needed until patient response/ emba arrived.   oxyCODONE-acetaminophen (PERCOCET) 10-325 MG tablet Take 1 tablet by mouth every 4 (four) hours as needed for pain.   pantoprazole (PROTONIX) 40 MG tablet Take 1 tablet (40 mg total) by mouth daily.   polyethylene glycol (MIRALAX / GLYCOLAX)  17 g packet Take 17 g by mouth daily. (Patient taking differently: Take 17 g by mouth daily. With 4 oz of fluids and drink)   Polyethylene Glycol 3350 POWD Take 17 g by mouth every morning. Mix with 4 oz of fluids   pregabalin (LYRICA) 100 MG capsule Take 1 capsule (100 mg total) by mouth 2 (two) times daily. (Patient taking differently: Take 100 mg by mouth 4 (four) times daily.)   sertraline (ZOLOFT) 50 MG tablet Take 50 mg by mouth daily.   SODIUM PHOSPHATES RE Place 1 application  rectally daily. As needed constipation 3 of 4   traZODone (DESYREL) 150 MG  tablet Take 75 mg by mouth at bedtime.   TRULICITY 1.5 MG/0.5ML SOPN Inject 0.5 mg into the skin once a week. Tuesday   No current facility-administered medications for this visit. (Other)   REVIEW OF SYSTEMS:   ALLERGIES No Known Allergies  PAST MEDICAL HISTORY Past Medical History:  Diagnosis Date   CAP (community acquired pneumonia) 12/09/2021   CKD (chronic kidney disease)    GERD (gastroesophageal reflux disease)    HTN (hypertension)    Insulin dependent type 2 diabetes mellitus (HCC)    Neuropathy    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   Stroke [facility])    Past Surgical History:  Procedure Laterality Date   ABDOMINAL AORTOGRAM W/LOWER EXTREMITY N/A 06/20/2023   Procedure: ABDOMINAL AORTOGRAM W/LOWER EXTREMITY;  Surgeon: Cephus Shelling, MD;  [facility];  Service: Cardiovascular;  Laterality: N/A;   AMPUTATION Left 02/04/2021   Procedure: LEFT GREAT TOE AMPUTATION;  Surgeon: [name], MD;  Location: [facility] OR;  Service: Orthopedics;  Laterality: Left;   AMPUTATION Left 02/10/2021   Procedure: LEFT FOOT 1ST RAY AMPUTATION;  Surgeon: [name], MD;  Location: [facility] OR;  Service: Orthopedics;  Laterality: Left;   AMPUTATION Left 03/22/2021   Procedure: LEFT BELOW KNEE AMPUTATION;  Surgeon: [name], MD;  Location: [facility] OR;  Service: Orthopedics;  Laterality: Left;   APPENDECTOMY     BACK SURGERY     I & D EXTREMITY Left 03/03/2021   Procedure: LEFT FOOT DEBRIDEMENT;  Surgeon: [name], MD;  Location: [facility] OR;  Service: Orthopedics;  Laterality: Left;   INCISION AND DRAINAGE ABSCESS N/A 10/06/2021   Procedure: INCISION AND DRAINAGE BACK ABSCESS;  Surgeon: Berna Bue, MD;  Location: [facility] OR;  Service: General;  Laterality: N/A;   INJECTION OF SILICONE OIL Left 07/25/2023   Procedure: INJECTION OF SILICONE OIL;  Surgeon: Rennis Chris, MD;  Location: [facility] OR;  Service: Ophthalmology;  Laterality: Left;   MEMBRANE PEEL Left  07/25/2023   Procedure: MEMBRANE PEEL;  Surgeon: Rennis Chris, MD;  Location: [facility] OR;  Service: Ophthalmology;  Laterality: Left;   PARS PLANA VITRECTOMY Left 07/25/2023   Procedure: PARS PLANA VITRECTOMY WITH 25 GAUGE;  Surgeon: Rennis Chris, MD;  Location: [facility] OR;  Service: Ophthalmology;  Laterality: Left;   removal of back cyst     TONSILLECTOMY     FAMILY HISTORY Family History  Problem Relation Age of Onset   Diabetes Mother    Heart attack Neg Hx    SOCIAL HISTORY Social History   Tobacco Use   Smoking status: Never   Smokeless tobacco: Never  Vaping Use   Vaping status: Never Used  Substance Use Topics   Alcohol use: Not Currently   Drug use: Not Currently       OPHTHALMIC  EXAM:  Not recorded    IMAGING AND PROCEDURES  Imaging and Procedures for 09/13/2023         ASSESSMENT/PLAN: No diagnosis found.   1-5. 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, OU (OD > OS) - last A1C 10.8 (09.12.23) - s/p IVA OD #1 (10.16.24) #2(11.15.24) - s/p IVA OS #1 (10.18.24) #2(11.15.24) - BCVA OD CF; OS HM - exam shows diffuse VH OU; OS with TRD inf nasal periphery - OCT shows: OD: +central edema greatest temporal fovea and macula; OS: Interval increase in IRF / edema temporal and superior fovea and mac under silicone oil - FA (10.16.24) OD: Diffuse blockage causing poor images,  no obvious NV, scattered patches of vascular non perfusion; OS: Focal patch of NVE IN midzone, scattered patches of vascular  non profusion greatest IN periphery  - recommend IVA OU #3 today, 12.13.24 - pt wishes to proceed with injection - RBA of procedure discussed, questions answered - IVA informed consent obtained and signed, 10.16.24 (OU) - see procedure note - s/p POW3 s/p PPV/MP/PFO/EL/SO OS, 10.24.2024 - doing well  - retina attached and in good position under oil -- good oil fill - IOP 15 - cont PF 4x/day OS  Brimonidine BID OS PSO ung QID OS -- decrease to QHS - post op drop and  positioning instructions reviewed  - tylenol/ibuprofen for pain  - f/u 4 weeks, DFE, OCT  6. Traction retinal detachment, OS - focal traction RD IN quad--focal traction fibrosis along IN arcades, SRF extended IN disc - s/p PPV/MP/PFO/EL/SO OS, 10.24.2024 -- see above - retina reattached under oil - drops as above   7,8. Hypertensive retinopathy OU - discussed importance of tight BP control - monitor   9. 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 - OD with large central PSC - discussed diagnosis and progression - under expert care of Dr. Zenaida Niece -- surgery scheduled for December 17th - monitor   Ophthalmic Meds Ordered this visit:  No orders of the defined types were placed in this encounter.    No follow-ups on file.  There are no Patient Instructions on file for this visit.  This document serves as a record of services personally performed by Karie Chimera, MD, PhD. It was created on their behalf by Berlin Hun COT, an ophthalmic technician. The creation of this record is the provider's dictation and/or activities during the visit.    Electronically signed by: Berlin Hun COT 12.12.24 9:55 AM    Abbreviations: M myopia (nearsighted); A astigmatism; H hyperopia (farsighted); P presbyopia; Mrx spectacle prescription;  CTL contact lenses; OD right eye; OS left eye; OU both eyes  XT exotropia; ET esotropia; PEK punctate epithelial keratitis; PEE punctate epithelial erosions; DES dry eye syndrome; MGD meibomian gland dysfunction; ATs artificial tears; PFAT's preservative free artificial tears; NSC nuclear sclerotic cataract; PSC posterior subcapsular cataract; ERM epi-retinal membrane; PVD posterior vitreous detachment; RD retinal detachment; DM diabetes mellitus; DR diabetic retinopathy; NPDR non-proliferative diabetic retinopathy; PDR proliferative diabetic retinopathy; CSME clinically  significant macular edema; DME diabetic macular edema; dbh dot blot hemorrhages; CWS cotton wool spot; POAG primary open angle glaucoma; C/D cup-to-disc ratio; HVF humphrey visual field; GVF goldmann visual field; OCT optical coherence tomography; IOP intraocular pressure; BRVO Branch retinal vein occlusion; CRVO central retinal vein occlusion; CRAO central retinal artery occlusion; BRAO branch retinal artery occlusion; RT retinal tear; SB scleral buckle; PPV pars plana vitrectomy; VH Vitreous hemorrhage; PRP  panretinal laser photocoagulation; IVK intravitreal kenalog; VMT vitreomacular traction; MH Macular hole;  NVD neovascularization of the disc; NVE neovascularization elsewhere; AREDS age related eye disease study; ARMD age related macular degeneration; POAG primary open angle glaucoma; EBMD epithelial/anterior basement membrane dystrophy; ACIOL anterior chamber intraocular lens; IOL intraocular lens; PCIOL posterior chamber intraocular lens; Phaco/IOL phacoemulsification with intraocular lens placement; PRK photorefractive keratectomy; LASIK laser assisted in situ keratomileusis; HTN hypertension; DM diabetes mellitus; COPD chronic obstructive pulmonary disease

## 2023-09-13 ENCOUNTER — Ambulatory Visit (INDEPENDENT_AMBULATORY_CARE_PROVIDER_SITE_OTHER): Payer: Medicare Other | Admitting: Ophthalmology

## 2023-09-13 ENCOUNTER — Encounter (INDEPENDENT_AMBULATORY_CARE_PROVIDER_SITE_OTHER): Payer: Self-pay | Admitting: Ophthalmology

## 2023-09-13 ENCOUNTER — Encounter (INDEPENDENT_AMBULATORY_CARE_PROVIDER_SITE_OTHER): Payer: Medicare Other | Admitting: Ophthalmology

## 2023-09-13 DIAGNOSIS — Z794 Long term (current) use of insulin: Secondary | ICD-10-CM | POA: Diagnosis not present

## 2023-09-13 DIAGNOSIS — E119 Type 2 diabetes mellitus without complications: Secondary | ICD-10-CM

## 2023-09-13 DIAGNOSIS — H35033 Hypertensive retinopathy, bilateral: Secondary | ICD-10-CM

## 2023-09-13 DIAGNOSIS — H3342 Traction detachment of retina, left eye: Secondary | ICD-10-CM

## 2023-09-13 DIAGNOSIS — H25813 Combined forms of age-related cataract, bilateral: Secondary | ICD-10-CM

## 2023-09-13 DIAGNOSIS — Z7985 Long-term (current) use of injectable non-insulin antidiabetic drugs: Secondary | ICD-10-CM

## 2023-09-13 DIAGNOSIS — H4313 Vitreous hemorrhage, bilateral: Secondary | ICD-10-CM

## 2023-09-13 DIAGNOSIS — E113513 Type 2 diabetes mellitus with proliferative diabetic retinopathy with macular edema, bilateral: Secondary | ICD-10-CM

## 2023-09-13 DIAGNOSIS — Z7984 Long term (current) use of oral hypoglycemic drugs: Secondary | ICD-10-CM

## 2023-09-13 DIAGNOSIS — I10 Essential (primary) hypertension: Secondary | ICD-10-CM

## 2023-09-13 MED ORDER — BEVACIZUMAB CHEMO INJECTION 1.25MG/0.05ML SYRINGE FOR KALEIDOSCOPE
1.2500 mg | INTRAVITREAL | Status: AC | PRN
  Administered 2023-09-13: 1.25 mg via INTRAVITREAL

## 2023-09-13 NOTE — Progress Notes (Signed)
[facility] - Clinic Note  09/13/2023   CHIEF COMPLAINT Patient presents for Retina Follow Up  HISTORY OF PRESENT ILLNESS: Brett White is a 62 y.o. male who presents to the clinic today for:  HPI     Retina Follow Up   Patient presents with  Diabetic Retinopathy.  In both eyes.  This started 4 weeks ago.  I, the attending physician,  performed the HPI with the patient and updated documentation appropriately.        Comments   Patient here for 4 weeks retina follow up for PDR OU. Patient states vision not good. OS still very blurry. OD is as worse as OS. Looks like looking through glasses of water. Been using drops. Has eye pain from when gets up in am to bedtime.       Last edited by Rennis Chris, MD on 09/13/2023  3:56 PM.    Pt is having cataract sx with Dr. Zenaida Niece next Tuesday, December 17th  Referring physician: Diona Foley, MD [address] [place],  [place] [address]  HISTORICAL INFORMATION:  Selected notes from the MEDICAL RECORD NUMBER Referred by Dr. Zenaida Niece for PDR w/ [facility] OU LEE:  Ocular Hx- PMH-   CURRENT MEDICATIONS: Current Outpatient Medications (Ophthalmic Drugs)  Medication Sig   bacitracin-polymyxin b (POLYSPORIN) ophthalmic ointment Place into the right eye 4 (four) times daily. Place a 1/2 inch ribbon of ointment into the lower eyelid 4 a day and as needed for discomfort   prednisoLONE acetate (PRED FORTE) 1 % ophthalmic suspension Place 1 drop into the left eye 4 (four) times daily.   No current facility-administered medications for this visit. (Ophthalmic Drugs)   Current Outpatient Medications (Other)  Medication Sig   acetaminophen (TYLENOL) 325 MG tablet Take 2 tablets (650 mg total) by mouth every 6 (six) hours as needed for mild pain (or Fever >/= 101).   amLODipine (NORVASC) 10 MG tablet Take 10 mg by mouth every morning.   atorvastatin (LIPITOR) 40 MG tablet Take 40 mg by mouth at bedtime.   bisacodyl (DULCOLAX) 5 MG  EC tablet Take 10 mg by mouth daily as needed (CONSTIPATION).   cyclobenzaprine (FLEXERIL) 5 MG tablet Take 1 tablet (5 mg total) by mouth 3 (three) times daily as needed for muscle spasms.   DULoxetine (CYMBALTA) 30 MG capsule Take 1 capsule (30 mg total) by mouth daily. (Patient taking differently: Take 60 mg by mouth daily.)   ergocalciferol (VITAMIN D2) 1.25 MG (50000 UT) capsule Take 50,000 Units by mouth once a week.   famotidine (PEPCID) 20 MG tablet Take 20 mg by mouth daily.   FARXIGA 10 MG TABS tablet Take 10 mg by mouth every morning. Dapaglifozin   furosemide (LASIX) 20 MG tablet Take 20 mg by mouth every morning.   glipiZIDE (GLUCOTROL) 10 MG tablet Take 10 mg by mouth every morning.   hydrocortisone cream 1 % Apply 1 Application topically daily. Apply to right lower leg for wound care   hydrOXYzine (ATARAX) 10 MG tablet Take 1 tablet (10 mg total) by mouth 3 (three) times daily as needed for itching.   insulin lispro (HUMALOG) 100 UNIT/ML KwikPen Inject 2-8 Units into the skin 3 (three) times daily with meals. Sliding scale 201-250=2 units 251-300=4 units 301-350=6 units 351-400= 8 units 401 or greater than=call [facility]   LINZESS 145 MCG CAPS capsule Take 145 mcg by mouth daily.   losartan (COZAAR) 50 MG tablet Take 1 tablet (50 mg total)  by mouth daily.   magnesium hydroxide (MILK OF MAGNESIA) 400 MG/5ML suspension Take 30 mLs by mouth daily as needed for mild constipation.   Melatonin 5 MG CAPS Take 5 mg by mouth at bedtime.   naloxone (NARCAN) nasal spray 4 mg/0.1 mL Place 1 spray into the nose See admin instructions. Every 2-3 minutesas needed until patient response/ emba arrived.   oxyCODONE-acetaminophen (PERCOCET) 10-325 MG tablet Take 1 tablet by mouth every 4 (four) hours as needed for pain.   pantoprazole (PROTONIX) 40 MG tablet Take 1 tablet (40 mg total) by mouth daily.   polyethylene glycol (MIRALAX / GLYCOLAX) 17 g packet Take 17 g by mouth daily. (Patient taking  differently: Take 17 g by mouth daily. With 4 oz of fluids and drink)   Polyethylene Glycol 3350 POWD Take 17 g by mouth every morning. Mix with 4 oz of fluids   pregabalin (LYRICA) 100 MG capsule Take 1 capsule (100 mg total) by mouth 2 (two) times daily. (Patient taking differently: Take 100 mg by mouth 4 (four) times daily.)   sertraline (ZOLOFT) 50 MG tablet Take 50 mg by mouth daily.   SODIUM PHOSPHATES RE Place 1 application  rectally daily. As needed constipation 3 of 4   traZODone (DESYREL) 150 MG tablet Take 75 mg by mouth at bedtime.   TRULICITY 1.5 MG/0.5ML SOPN Inject 0.5 mg into the skin once a week. Tuesday   No current facility-administered medications for this visit. (Other)   REVIEW OF SYSTEMS: ROS   Positive for: Musculoskeletal, Endocrine, Cardiovascular, Eyes Negative for: Constitutional, Gastrointestinal, Neurological, Skin, Genitourinary, HENT, Respiratory, Psychiatric, Allergic/Imm, Heme/Lymph Last edited by Laddie Aquas, COA on 09/13/2023 10:12 AM.     ALLERGIES No Known Allergies  PAST MEDICAL HISTORY Past Medical History:  Diagnosis Date   CAP (community acquired pneumonia) 12/09/2021   CKD (chronic kidney disease)    GERD (gastroesophageal reflux disease)    HTN (hypertension)    Insulin dependent type 2 diabetes mellitus (HCC)    Neuropathy    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   Stroke [facility])    Past Surgical History:  Procedure Laterality Date   ABDOMINAL AORTOGRAM W/LOWER EXTREMITY N/A 06/20/2023   Procedure: ABDOMINAL AORTOGRAM W/LOWER EXTREMITY;  Surgeon: Cephus Shelling, MD;  Location: [facility] INVASIVE CV LAB;  Service: Cardiovascular;  Laterality: N/A;   AMPUTATION Left 02/04/2021   Procedure: LEFT GREAT TOE AMPUTATION;  Surgeon: [name], MD;  Location[facility] OR;  Service: Orthopedics;  Laterality: Left;   AMPUTATION Left 02/10/2021   Procedure: LEFT FOOT 1ST RAY AMPUTATION;  Surgeon: [name], MD;  Location:  [facility] OR;  Service: Orthopedics;  Laterality: Left;   AMPUTATION Left 03/22/2021   Procedure: LEFT BELOW KNEE AMPUTATION;  Surgeon: [name], MD;  Location: [facility] OR;  Service: Orthopedics;  Laterality: Left;   APPENDECTOMY     BACK SURGERY     I & D EXTREMITY Left 03/03/2021   Procedure: LEFT FOOT DEBRIDEMENT;  Surgeon: [name], MD;  Location: [facility] OR;  Service: Orthopedics;  Laterality: Left;   INCISION AND DRAINAGE ABSCESS N/A 10/06/2021   Procedure: INCISION AND DRAINAGE BACK ABSCESS;  Surgeon: Berna Bue, MD;  Location: [facility] OR;  Service: General;  Laterality: N/A;   INJECTION OF SILICONE OIL Left 07/25/2023   Procedure: INJECTION OF SILICONE OIL;  Surgeon: Rennis Chris, MD;  Location: [facility] OR;  Service: Ophthalmology;  Laterality: Left;   MEMBRANE PEEL Left  07/25/2023   Procedure: MEMBRANE PEEL;  Surgeon: Rennis Chris, MD;  Location: [facility] OR;  Service: Ophthalmology;  Laterality: Left;   PARS PLANA VITRECTOMY Left 07/25/2023   Procedure: PARS PLANA VITRECTOMY WITH 25 GAUGE;  Surgeon: Rennis Chris, MD;  Location: [facility] OR;  Service: Ophthalmology;  Laterality: Left;   removal of back cyst     TONSILLECTOMY     FAMILY HISTORY Family History  Problem Relation Age of Onset   Diabetes Mother    Heart attack Neg Hx    SOCIAL HISTORY Social History   Tobacco Use   Smoking status: Never   Smokeless tobacco: Never  Vaping Use   Vaping status: Never Used  Substance Use Topics   Alcohol use: Not Currently   Drug use: Not Currently       OPHTHALMIC EXAM:  Base Eye Exam     Visual Acuity (Snellen - Linear)       Right Left   Dist [place] CF at 3' CF at 1'   Dist ph [place] NI NI         Tonometry (Tonopen, 10:09 AM)       Right Left   Pressure 21 18         Pupils       Dark Light Shape React APD   Right 3 2 Round Brisk None   Left 6 6 Round NR None         Visual Fields (Counting fingers)       Left Right   Restrictions Total superior temporal, inferior  temporal, superior nasal, inferior nasal deficiencies Total superior temporal, inferior temporal, superior nasal, inferior nasal deficiencies         Extraocular Movement       Right Left    Full, Ortho Full, Ortho         Neuro/Psych     Oriented x3: Yes   Mood/Affect: Normal         Dilation     Both eyes: 1.0% Mydriacyl, 2.5% Phenylephrine @ 10:09 AM           Slit Lamp and Fundus Exam     External Exam       Right Left   External Normal Normal         Slit Lamp Exam       Right Left   Lids/Lashes Dermatochalasis - upper lid Dermatochalasis - upper lid   Conjunctiva/Sclera White and quiet sutures dissolving   Cornea 1-2+ pigmented guttata trace PEE, trace tear film debris   Anterior Chamber Deep and quiet moderate depth, narrow angles   Iris Round and reactive Round and dilated, No NVI   Lens 2+ Nuclear sclerosis, 2-3+ Cortical cataract, 2-3+ Posterior subcapsular cataract 2-3+ Nuclear sclerosis, 2-3+ Cortical cataract, trace PSC   Anterior Vitreous Vitreous syneresis, Posterior vitreous detachment, blood stained Vitreous condensations clearing and settling inferiorly post vitrectomy, good silicone oil fill         Fundus Exam       Right Left   Disc Pink and sharp, no NVD Pink and Sharp   C/D Ratio 0.5 0.3   Macula Hazy view, Flat, Blunted foveal reflex, scattered MA/DBH, +edema central and temporal macula flat under oil, scattered MA/DBH, +edema -- slightly improved   Vessels attenuated, Tortuous attenuated, mild tortuosity   Periphery Attached, scattered MA/DBH, inferior retina obsured VH / blood clots Inferonasal retina reattached under oil; 360 PRP laser changes, PRE-OP: 360 MA/DBH, focal tractional RD IN quad --  focal tractional fibrosis / NVE along IN arcades - SRF extends posteriorly to IN disc           IMAGING AND PROCEDURES  Imaging and Procedures for 09/13/2023  OCT, Retina - OU - Both Eyes       Right Eye Quality was poor.  Central Foveal Thickness: 343. Progression has improved. Findings include no SRF, abnormal foveal contour, epiretinal membrane, intraretinal fluid (Persistent central edema greatest temporal fovea and macula -- slightly improved).   Left Eye Quality was good. Central Foveal Thickness: 377. Progression has improved. Findings include no SRF, abnormal foveal contour, intraretinal hyper-reflective material (Interval improvement in IRF / edema temporal and superior fovea and mac under silicone oil).   Notes *Images captured and stored on drive  Diagnosis / Impression:  OD: Persistent central edema greatest temporal fovea and macula -- slightly improved  OS: Interval improvement in IRF / edema temporal and superior fovea and mac under silicone oil  Clinical management:  See below  Abbreviations: NFP - Normal foveal profile. CME - cystoid macular edema. PED - pigment epithelial detachment. IRF - intraretinal fluid. SRF - subretinal fluid. EZ - ellipsoid zone. ERM - epiretinal membrane. ORA - outer retinal atrophy. ORT - outer retinal tubulation. SRHM - subretinal hyper-reflective material. IRHM - intraretinal hyper-reflective material      Intravitreal Injection, Pharmacologic Agent - OD - Right Eye       Time Out 09/13/2023. 12:11 PM. Confirmed correct patient, procedure, site, and patient consented.   Anesthesia Topical anesthesia was used. Anesthetic medications included Lidocaine 2%, Proparacaine 0.5%.   Procedure Preparation included 5% betadine to ocular surface, eyelid speculum. A (32g) needle was used.   Injection: 1.25 mg Bevacizumab 1.25mg /0.47ml   Route: Intravitreal, Site: Right Eye   NDC: P3213405, Lot: [id number], Expiration date: 12/09/2023   Post-op Post injection exam found visual acuity of at least counting fingers. The patient tolerated the procedure well. There were no complications. The patient received written and verbal post procedure care education.       Intravitreal Injection, Pharmacologic Agent - OS - Left Eye       Time Out 09/13/2023. 12:11 PM. Confirmed correct patient, procedure, site, and patient consented.   Anesthesia Topical anesthesia was used. Anesthetic medications included Lidocaine 2%, Proparacaine 0.5%.   Procedure Preparation included 5% betadine to ocular surface, eyelid speculum. A (32g) needle was used.   Injection: 1.25 mg Bevacizumab 1.25mg /0.62ml   Route: Intravitreal, Site: Left Eye   NDC: P3213405, Lot: [id number], Expiration date: 12/16/2023   Post-op Post injection exam found visual acuity of at least counting fingers. The patient tolerated the procedure well. There were no complications. The patient received written and verbal post procedure care education.           ASSESSMENT/PLAN:   ICD-10-CM   1. 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 Diabetes mellitus treated with oral medication (HCC)  E11.9    Z79.84     3. Encounter for long-term (current) use of insulin (HCC)  Z79.4     4. Diabetes mellitus treated with injections of non-insulin medication (HCC)  E11.9  Z79.85     5. Vitreous hemorrhage of both eyes (HCC)  H43.13     6. Retinal detachment, tractional, left eye  H33.42     7. Essential hypertension  I10     8. Hypertensive retinopathy of both eyes  H35.033     9. Combined forms of age-related cataract of both eyes  H25.813      1-5. 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, OU (OD > OS) - last A1C 10.8 (09.12.23) - s/p IVA OD #1 (10.16.24), #2 (11.15.24) - s/p IVA OS #1 (10.18.24), #2 (11.15.24) - BCVA OD CF @ 1ft -- stable; OS improved to CF @1ft   from HM - exam shows diffuse VH OU; OS with TRD inf nasal periphery - OCT shows: OD: persistent central edema greatest temporal fovea and macula -- slightly improved; OS: Interval improvement in IRF / edema temporal and superior fovea and mac under silicone oil - FA (10.16.24) OD: Diffuse blockage causing poor images,  no obvious NV, scattered patches of vascular non perfusion; OS: Focal patch of NVE IN midzone, scattered patches of vascular  non profusion greatest IN periphery  - recommend IVA OU #3 today, 12.13.24 - pt wishes to proceed with injection - RBA of procedure discussed, questions answered - IVA informed consent obtained and signed, 10.16.24 (OU) - see procedure note - s/p POW7 s/p PPV/MP/PFO/EL/SO OS, 10.24.2024 - doing well  - retina attached and in good position under oil -- good oil fill - IOP 18 - cont PF 4x/day OS  Brimonidine BID OS PSO ung QHS OS  - post op drop and positioning instructions reviewed  - tylenol/ibuprofen for pain  - f/u 4 weeks, DFE, OCT  6. Traction retinal detachment, OS - focal traction RD IN quad--focal traction fibrosis along IN arcades, SRF extended IN disc - s/p PPV/MP/PFO/EL/SO OS, 10.24.2024 -- see above - retina reattached under oil - drops as above   7,8. Hypertensive retinopathy OU - discussed importance of tight BP control - monitor   9. 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 - OD with large central PSC - discussed diagnosis and progression - under expert care of Dr. Zenaida Niece -- surgery scheduled for December 17th - monitor   Ophthalmic Meds Ordered this visit:  Meds ordered this encounter  Medications   Bevacizumab (AVASTIN) SOLN 1.25 mg   Bevacizumab (AVASTIN) SOLN 1.25 mg     Return in about 4 weeks (around 10/11/2023) for f/u PDR OU, DFE, OCT.  There are no Patient Instructions on file for this visit.  This document serves as a record of  services personally performed by Karie Chimera, MD, PhD. It was created on their behalf by Berlin Hun COT, an ophthalmic technician. The creation of this record is the provider's dictation and/or activities during the visit.    Electronically signed by: Berlin Hun COT 11.14.24 12:21 AM  This document serves as a record of services personally performed by Karie Chimera, MD, PhD. It was created on their behalf by Glee Arvin. Manson Passey, OA an ophthalmic technician. The creation of this record is the provider's dictation and/or activities during the visit.    Electronically signed by: Glee Arvin. Manson Passey, OA 09/14/23 12:21 AM  Karie Chimera, M.D., Ph.D. Diseases & Surgery of the Retina and Vitreous [facility] 09/13/2023   I have reviewed the above documentation for accuracy and completeness, and I agree with the above.  Karie Chimera, M.D., Ph.D. 09/14/23 12:23 AM   Abbreviations: M myopia (nearsighted); A astigmatism; H hyperopia (farsighted); P presbyopia; Mrx spectacle prescription;  CTL contact lenses; OD right eye; OS left eye; OU both eyes  XT exotropia; ET esotropia; PEK punctate epithelial keratitis; PEE punctate epithelial erosions; DES dry eye syndrome; MGD meibomian gland dysfunction; ATs artificial tears; PFAT's preservative free artificial tears; NSC nuclear sclerotic cataract; PSC posterior subcapsular cataract; ERM epi-retinal membrane; PVD posterior vitreous detachment; RD retinal detachment; DM diabetes mellitus; DR diabetic retinopathy; NPDR non-proliferative diabetic retinopathy; PDR proliferative diabetic retinopathy; CSME clinically significant macular edema; DME diabetic macular edema; dbh dot blot hemorrhages; CWS cotton wool spot; POAG primary open angle glaucoma; C/D cup-to-disc ratio; HVF humphrey visual field; GVF goldmann visual field; OCT optical coherence tomography; IOP intraocular pressure; BRVO Branch retinal vein occlusion; CRVO  central retinal vein occlusion; CRAO central retinal artery occlusion; BRAO branch retinal artery occlusion; RT retinal tear; SB scleral buckle; PPV pars plana vitrectomy; VH Vitreous hemorrhage; PRP panretinal laser photocoagulation; IVK intravitreal kenalog; VMT vitreomacular traction; MH Macular hole;  NVD neovascularization of the disc; NVE neovascularization elsewhere; AREDS age related eye disease study; ARMD age related macular degeneration; POAG primary open angle glaucoma; EBMD epithelial/anterior basement membrane dystrophy; ACIOL anterior chamber intraocular lens; IOL intraocular lens; PCIOL posterior chamber intraocular lens; Phaco/IOL phacoemulsification with intraocular lens placement; PRK photorefractive keratectomy; LASIK laser assisted in situ keratomileusis; HTN hypertension; DM diabetes mellitus; COPD chronic obstructive pulmonary disease

## 2023-10-01 ENCOUNTER — Other Ambulatory Visit (HOSPITAL_COMMUNITY): Payer: Self-pay

## 2023-10-11 ENCOUNTER — Encounter (INDEPENDENT_AMBULATORY_CARE_PROVIDER_SITE_OTHER): Payer: Medicare Other | Admitting: Ophthalmology

## 2023-10-22 ENCOUNTER — Encounter (INDEPENDENT_AMBULATORY_CARE_PROVIDER_SITE_OTHER): Payer: Medicare Other | Admitting: Ophthalmology

## 2023-10-22 DIAGNOSIS — H35033 Hypertensive retinopathy, bilateral: Secondary | ICD-10-CM

## 2023-10-22 DIAGNOSIS — Z794 Long term (current) use of insulin: Secondary | ICD-10-CM

## 2023-10-22 DIAGNOSIS — H25813 Combined forms of age-related cataract, bilateral: Secondary | ICD-10-CM

## 2023-10-22 DIAGNOSIS — I10 Essential (primary) hypertension: Secondary | ICD-10-CM

## 2023-10-22 DIAGNOSIS — H4313 Vitreous hemorrhage, bilateral: Secondary | ICD-10-CM

## 2023-10-22 DIAGNOSIS — H3342 Traction detachment of retina, left eye: Secondary | ICD-10-CM

## 2023-10-22 DIAGNOSIS — E119 Type 2 diabetes mellitus without complications: Secondary | ICD-10-CM

## 2023-10-22 DIAGNOSIS — E113513 Type 2 diabetes mellitus with proliferative diabetic retinopathy with macular edema, bilateral: Secondary | ICD-10-CM

## 2023-10-23 NOTE — Progress Notes (Shared)
[facility] - Clinic Note  11/05/2023   CHIEF COMPLAINT Patient presents for No chief complaint on file.  HISTORY OF PRESENT ILLNESS: Brett White is a 63 y.o. male who presents to the clinic today for:   Pt is having cataract sx with Dr. Zenaida Niece next Tuesday, December 17th  Referring physician: Diona Foley, MD [address] [place],  [place] [address]  HISTORICAL INFORMATION:  Selected notes from the MEDICAL RECORD NUMBER Referred by Dr. Zenaida Niece for PDR w/ [facility] OU LEE:  Ocular Hx- PMH-   CURRENT MEDICATIONS: Current Outpatient Medications (Ophthalmic Drugs)  Medication Sig   bacitracin-polymyxin b (POLYSPORIN) ophthalmic ointment Place into the right eye 4 (four) times daily. Place a 1/2 inch ribbon of ointment into the lower eyelid 4 a day and as needed for discomfort   prednisoLONE acetate (PRED FORTE) 1 % ophthalmic suspension Place 1 drop into the left eye 4 (four) times daily.   No current facility-administered medications for this visit. (Ophthalmic Drugs)   Current Outpatient Medications (Other)  Medication Sig   acetaminophen (TYLENOL) 325 MG tablet Take 2 tablets (650 mg total) by mouth every 6 (six) hours as needed for mild pain (or Fever >/= 101).   amLODipine (NORVASC) 10 MG tablet Take 10 mg by mouth every morning.   atorvastatin (LIPITOR) 40 MG tablet Take 40 mg by mouth at bedtime.   bisacodyl (DULCOLAX) 5 MG EC tablet Take 10 mg by mouth daily as needed (CONSTIPATION).   cyclobenzaprine (FLEXERIL) 5 MG tablet Take 1 tablet (5 mg total) by mouth 3 (three) times daily as needed for muscle spasms.   DULoxetine (CYMBALTA) 30 MG capsule Take 1 capsule (30 mg total) by mouth daily. (Patient taking differently: Take 60 mg by mouth daily.)   ergocalciferol (VITAMIN D2) 1.25 MG (50000 UT) capsule Take 50,000 Units by mouth once a week.   famotidine (PEPCID) 20 MG tablet Take 20 mg by mouth daily.   FARXIGA 10 MG TABS tablet Take 10 mg by mouth every  morning. Dapaglifozin   furosemide (LASIX) 20 MG tablet Take 20 mg by mouth every morning.   glipiZIDE (GLUCOTROL) 10 MG tablet Take 10 mg by mouth every morning.   hydrocortisone cream 1 % Apply 1 Application topically daily. Apply to right lower leg for wound care   hydrOXYzine (ATARAX) 10 MG tablet Take 1 tablet (10 mg total) by mouth 3 (three) times daily as needed for itching.   insulin lispro (HUMALOG) 100 UNIT/ML KwikPen Inject 2-8 Units into the skin 3 (three) times daily with meals. Sliding scale 201-250=2 units 251-300=4 units 301-350=6 units 351-400= 8 units 401 or greater than=call [facility]   LINZESS 145 MCG CAPS capsule Take 145 mcg by mouth daily.   losartan (COZAAR) 50 MG tablet Take 1 tablet (50 mg total) by mouth daily.   magnesium hydroxide (MILK OF MAGNESIA) 400 MG/5ML suspension Take 30 mLs by mouth daily as needed for mild constipation.   Melatonin 5 MG CAPS Take 5 mg by mouth at bedtime.   naloxone (NARCAN) nasal spray 4 mg/0.1 mL Place 1 spray into the nose See admin instructions. Every 2-3 minutesas needed until patient response/ emba arrived.   oxyCODONE-acetaminophen (PERCOCET) 10-325 MG tablet Take 1 tablet by mouth every 4 (four) hours as needed for pain.   pantoprazole (PROTONIX) 40 MG tablet Take 1 tablet (40 mg total) by mouth daily.   polyethylene glycol (MIRALAX / GLYCOLAX) 17 g packet Take 17 g by mouth daily. (  Patient taking differently: Take 17 g by mouth daily. With 4 oz of fluids and drink)   Polyethylene Glycol 3350 POWD Take 17 g by mouth every morning. Mix with 4 oz of fluids   pregabalin (LYRICA) 100 MG capsule Take 1 capsule (100 mg total) by mouth 2 (two) times daily. (Patient taking differently: Take 100 mg by mouth 4 (four) times daily.)   sertraline (ZOLOFT) 50 MG tablet Take 50 mg by mouth daily.   SODIUM PHOSPHATES RE Place 1 application  rectally daily. As needed constipation 3 of 4   traZODone (DESYREL) 150 MG tablet Take 75 mg by mouth at  bedtime.   TRULICITY 1.5 MG/0.5ML SOPN Inject 0.5 mg into the skin once a week. Tuesday   No current facility-administered medications for this visit. (Other)   REVIEW OF SYSTEMS:   ALLERGIES No Known Allergies  PAST MEDICAL HISTORY Past Medical History:  Diagnosis Date   CAP (community acquired pneumonia) 12/09/2021   CKD (chronic kidney disease)    GERD (gastroesophageal reflux disease)    HTN (hypertension)    Insulin dependent type 2 diabetes mellitus (HCC)    Neuropathy    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   Stroke [facility])    Past Surgical History:  Procedure Laterality Date   ABDOMINAL AORTOGRAM W/LOWER EXTREMITY N/A 06/20/2023   Procedure: ABDOMINAL AORTOGRAM W/LOWER EXTREMITY;  Surgeon: Cephus Shelling, MD;  Location: [facility] INVASIVE CV LAB;  Service: Cardiovascular;  Laterality: N/A;   AMPUTATION Left 02/04/2021   Procedure: LEFT GREAT TOE AMPUTATION;  Surgeon: [name], MD;  Location: [facility] OR;  Service: Orthopedics;  Laterality: Left;   AMPUTATION Left 02/10/2021   Procedure: LEFT FOOT 1ST RAY AMPUTATION;  Surgeon: [name], MD;  Location: [facility] OR;  Service: Orthopedics;  Laterality: Left;   AMPUTATION Left 03/22/2021   Procedure: LEFT BELOW KNEE AMPUTATION;  Surgeon: [name], MD;  Location: [facility] OR;  Service: Orthopedics;  Laterality: Left;   APPENDECTOMY     BACK SURGERY     I & D EXTREMITY Left 03/03/2021   Procedure: LEFT FOOT DEBRIDEMENT;  Surgeon: [name], MD;  Location: [facility] OR;  Service: Orthopedics;  Laterality: Left;   INCISION AND DRAINAGE ABSCESS N/A 10/06/2021   Procedure: INCISION AND DRAINAGE BACK ABSCESS;  Surgeon: Berna Bue, MD;  Location: [facility] OR;  Service: General;  Laterality: N/A;   INJECTION OF SILICONE OIL Left 07/25/2023   Procedure: INJECTION OF SILICONE OIL;  Surgeon: Rennis Chris, MD;  Location: [facility] OR;  Service: Ophthalmology;  Laterality: Left;   MEMBRANE PEEL Left 07/25/2023   Procedure:  MEMBRANE PEEL;  Surgeon: Rennis Chris, MD;  Location: [facility] OR;  Service: Ophthalmology;  Laterality: Left;   PARS PLANA VITRECTOMY Left 07/25/2023   Procedure: PARS PLANA VITRECTOMY WITH 25 GAUGE;  Surgeon: Rennis Chris, MD;  Location: [facility] OR;  Service: Ophthalmology;  Laterality: Left;   removal of back cyst     TONSILLECTOMY     FAMILY HISTORY Family History  Problem Relation Age of Onset   Diabetes Mother    Heart attack Neg Hx    SOCIAL HISTORY Social History   Tobacco Use   Smoking status: Never   Smokeless tobacco: Never  Vaping Use   Vaping status: Never Used  Substance Use Topics   Alcohol use: Not Currently   Drug use: Not Currently       OPHTHALMIC EXAM:  Not recorded    IMAGING AND  PROCEDURES  Imaging and Procedures for 11/05/2023         ASSESSMENT/PLAN:   ICD-10-CM   1. 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 Diabetes mellitus treated with oral medication (HCC)  E11.9    Z79.84     3. Encounter for long-term (current) use of insulin (HCC)  Z79.4     4. Diabetes mellitus treated with injections of non-insulin medication (HCC)  E11.9    Z79.85     5. Vitreous hemorrhage of both eyes (HCC)  H43.13     6. Retinal detachment, tractional, left eye  H33.42     7. Essential hypertension  I10     8. Hypertensive retinopathy of both eyes  H35.033     9. Combined forms of age-related cataract of both eyes  H25.813       1-5. 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, OU (OD > OS) - last A1C 10.8 (09.12.23) - s/p IVA OD #1 (10.16.24), #2 (11.15.24), #3 (01.21.25) - s/p IVA OS #1 (10.18.24), #2 (11.15.24), #3 (12.13.24), #4 (01.21.25) - BCVA OD CF @ 35ft -- stable; OS improved to CF @1ft  from HM - exam shows diffuse VH OU; OS with TRD inf nasal periphery - OCT shows: OD: persistent central edema greatest temporal fovea and macula -- slightly improved; OS:  Interval improvement in IRF / edema temporal and superior fovea and mac under silicone oil - FA (10.16.24) OD: Diffuse blockage causing poor images,  no obvious NV, scattered patches of vascular non perfusion; OS: Focal patch of NVE IN midzone, scattered patches of vascular  non profusion greatest IN periphery  - recommend IVA OU #5 today, 02.04.25 - pt wishes to proceed with injection - RBA of procedure discussed, questions answered - IVA informed consent obtained and signed, 10.16.24 (OU) - see procedure note - s/p POW7 s/p PPV/MP/PFO/EL/SO OS, 10.24.2024 - doing well  - retina attached and in good position under oil -- good oil fill - IOP 18 - cont PF 4x/day OS  Brimonidine BID OS PSO ung QHS OS  - post op drop and positioning instructions reviewed  - tylenol/ibuprofen for pain  - f/u 4 weeks, DFE, OCT  6. Traction retinal detachment, OS - focal traction RD IN quad--focal traction fibrosis along IN arcades, SRF extended IN disc - s/p PPV/MP/PFO/EL/SO OS, 10.24.2024 -- see above - retina reattached under oil - drops as above   7,8. Hypertensive retinopathy OU - discussed importance of tight BP control - monitor   9. 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 - OD with large central PSC - discussed diagnosis and progression - under expert care of Dr. Zenaida Niece -- surgery scheduled for December 17th - monitor   Ophthalmic Meds Ordered this visit:  No orders of the defined types were placed in this encounter.    No follow-ups on file.  There are no Patient Instructions on file for this visit.  This document serves as a record of services personally performed by Karie Chimera, MD, PhD. It was created on their behalf by Charlette Caffey, COT an ophthalmic technician. The creation of this record is the provider's dictation and/or activities during the visit.    Electronically signed by:   Charlette Caffey, COT  10/23/23 7:27 AM   Karie Chimera, M.D., Ph.D. Diseases & Surgery of the Retina  and Vitreous [facility]  Abbreviations: M myopia (nearsighted); A astigmatism; H hyperopia (farsighted); P presbyopia; Mrx spectacle prescription;  CTL contact lenses; OD right eye; OS left eye; OU both eyes  XT exotropia; ET esotropia; PEK punctate epithelial keratitis; PEE punctate epithelial erosions; DES dry eye syndrome; MGD meibomian gland dysfunction; ATs artificial tears; PFAT's preservative free artificial tears; NSC nuclear sclerotic cataract; PSC posterior subcapsular cataract; ERM epi-retinal membrane; PVD posterior vitreous detachment; RD retinal detachment; DM diabetes mellitus; DR diabetic retinopathy; NPDR non-proliferative diabetic retinopathy; PDR proliferative diabetic retinopathy; CSME clinically significant macular edema; DME diabetic macular edema; dbh dot blot hemorrhages; CWS cotton wool spot; POAG primary open angle glaucoma; C/D cup-to-disc ratio; HVF humphrey visual field; GVF goldmann visual field; OCT optical coherence tomography; IOP intraocular pressure; BRVO Branch retinal vein occlusion; CRVO central retinal vein occlusion; CRAO central retinal artery occlusion; BRAO branch retinal artery occlusion; RT retinal tear; SB scleral buckle; PPV pars plana vitrectomy; VH Vitreous hemorrhage; PRP panretinal laser photocoagulation; IVK intravitreal kenalog; VMT vitreomacular traction; MH Macular hole;  NVD neovascularization of the disc; NVE neovascularization elsewhere; AREDS age related eye disease study; ARMD age related macular degeneration; POAG primary open angle glaucoma; EBMD epithelial/anterior basement membrane dystrophy; ACIOL anterior chamber intraocular lens; IOL intraocular lens; PCIOL posterior chamber intraocular lens; Phaco/IOL phacoemulsification with intraocular lens placement; PRK photorefractive keratectomy; LASIK laser assisted in  situ keratomileusis; HTN hypertension; DM diabetes mellitus; COPD chronic obstructive pulmonary disease

## 2023-11-05 ENCOUNTER — Encounter (INDEPENDENT_AMBULATORY_CARE_PROVIDER_SITE_OTHER): Payer: Medicare Other | Admitting: Ophthalmology

## 2023-11-05 ENCOUNTER — Encounter (INDEPENDENT_AMBULATORY_CARE_PROVIDER_SITE_OTHER): Payer: Self-pay

## 2023-11-05 DIAGNOSIS — E119 Type 2 diabetes mellitus without complications: Secondary | ICD-10-CM

## 2023-11-05 DIAGNOSIS — H3342 Traction detachment of retina, left eye: Secondary | ICD-10-CM

## 2023-11-05 DIAGNOSIS — E113513 Type 2 diabetes mellitus with proliferative diabetic retinopathy with macular edema, bilateral: Secondary | ICD-10-CM

## 2023-11-05 DIAGNOSIS — H35033 Hypertensive retinopathy, bilateral: Secondary | ICD-10-CM

## 2023-11-05 DIAGNOSIS — I10 Essential (primary) hypertension: Secondary | ICD-10-CM

## 2023-11-05 DIAGNOSIS — H25813 Combined forms of age-related cataract, bilateral: Secondary | ICD-10-CM

## 2023-11-05 DIAGNOSIS — H4313 Vitreous hemorrhage, bilateral: Secondary | ICD-10-CM

## 2023-11-05 DIAGNOSIS — Z7985 Long-term (current) use of injectable non-insulin antidiabetic drugs: Secondary | ICD-10-CM

## 2023-11-05 DIAGNOSIS — Z794 Long term (current) use of insulin: Secondary | ICD-10-CM

## 2023-11-13 NOTE — Progress Notes (Signed)
[facility] - Clinic Note  11/15/2023   CHIEF COMPLAINT Patient presents for Retina Follow Up  HISTORY OF PRESENT ILLNESS: Brett White is a 63 y.o. male who presents to the clinic today for:  HPI     Retina Follow Up   Patient presents with  Diabetic Retinopathy.  In both eyes.  This started 4 weeks ago.  Duration of 4 weeks.  Since onset it is gradually worsening.  I, the attending physician,  performed the HPI with the patient and updated documentation appropriately.        Comments   Retina follow up PDR OU and IVA OU pt is 2 month delayed appointment pt is reporting that vision in OS more blurred and watering more his last reading was 123 this am pt denies any flashes has some floaters pt had cataract surgery last month OU with Dr Zenaida Niece he is still using drops unsure which ones due to facility administering       Last edited by Rennis Chris, MD on 11/15/2023  9:29 AM.    Pt states he has had cataract sx OU with Dr. Zenaida Niece, he states vision in the right eye has improved, left eye has not gotten better, he states his blood sugar control has been good, blood sugar has been in the 120's   Referring physician: Diona Foley, MD [address] [place],  [place] [address]  HISTORICAL INFORMATION:  Selected notes from the MEDICAL RECORD NUMBER Referred by Dr. Zenaida Niece for PDR w/ [facility] OU LEE:  Ocular Hx- PMH-   CURRENT MEDICATIONS: Current Outpatient Medications (Ophthalmic Drugs)  Medication Sig   bacitracin-polymyxin b (POLYSPORIN) ophthalmic ointment Place into the right eye 4 (four) times daily. Place a 1/2 inch ribbon of ointment into the lower eyelid 4 a day and as needed for discomfort   prednisoLONE acetate (PRED FORTE) 1 % ophthalmic suspension Place 1 drop into the left eye 4 (four) times daily.   No current facility-administered medications for this visit. (Ophthalmic Drugs)   Current Outpatient Medications (Other)  Medication Sig   acetaminophen  (TYLENOL) 325 MG tablet Take 2 tablets (650 mg total) by mouth every 6 (six) hours as needed for mild pain (or Fever >/= 101).   amLODipine (NORVASC) 10 MG tablet Take 10 mg by mouth every morning.   atorvastatin (LIPITOR) 40 MG tablet Take 40 mg by mouth at bedtime.   bisacodyl (DULCOLAX) 5 MG EC tablet Take 10 mg by mouth daily as needed (CONSTIPATION).   cyclobenzaprine (FLEXERIL) 5 MG tablet Take 1 tablet (5 mg total) by mouth 3 (three) times daily as needed for muscle spasms.   DULoxetine (CYMBALTA) 30 MG capsule Take 1 capsule (30 mg total) by mouth daily. (Patient taking differently: Take 60 mg by mouth daily.)   ergocalciferol (VITAMIN D2) 1.25 MG (50000 UT) capsule Take 50,000 Units by mouth once a week.   famotidine (PEPCID) 20 MG tablet Take 20 mg by mouth daily.   FARXIGA 10 MG TABS tablet Take 10 mg by mouth every morning. Dapaglifozin   furosemide (LASIX) 20 MG tablet Take 20 mg by mouth every morning.   glipiZIDE (GLUCOTROL) 10 MG tablet Take 10 mg by mouth every morning.   hydrocortisone cream 1 % Apply 1 Application topically daily. Apply to right lower leg for wound care   hydrOXYzine (ATARAX) 10 MG tablet Take 1 tablet (10 mg total) by mouth 3 (three) times daily as needed for itching.   insulin lispro (  HUMALOG) 100 UNIT/ML KwikPen Inject 2-8 Units into the skin 3 (three) times daily with meals. Sliding scale 201-250=2 units 251-300=4 units 301-350=6 units 351-400= 8 units 401 or greater than=call [facility]   LINZESS 145 MCG CAPS capsule Take 145 mcg by mouth daily.   losartan (COZAAR) 50 MG tablet Take 1 tablet (50 mg total) by mouth daily.   magnesium hydroxide (MILK OF MAGNESIA) 400 MG/5ML suspension Take 30 mLs by mouth daily as needed for mild constipation.   Melatonin 5 MG CAPS Take 5 mg by mouth at bedtime.   naloxone (NARCAN) nasal spray 4 mg/0.1 mL Place 1 spray into the nose See admin instructions. Every 2-3 minutesas needed until patient response/ emba arrived.    oxyCODONE-acetaminophen (PERCOCET) 10-325 MG tablet Take 1 tablet by mouth every 4 (four) hours as needed for pain.   pantoprazole (PROTONIX) 40 MG tablet Take 1 tablet (40 mg total) by mouth daily.   polyethylene glycol (MIRALAX / GLYCOLAX) 17 g packet Take 17 g by mouth daily. (Patient taking differently: Take 17 g by mouth daily. With 4 oz of fluids and drink)   Polyethylene Glycol 3350 POWD Take 17 g by mouth every morning. Mix with 4 oz of fluids   pregabalin (LYRICA) 100 MG capsule Take 1 capsule (100 mg total) by mouth 2 (two) times daily. (Patient taking differently: Take 100 mg by mouth 4 (four) times daily.)   sertraline (ZOLOFT) 50 MG tablet Take 50 mg by mouth daily.   SODIUM PHOSPHATES RE Place 1 application  rectally daily. As needed constipation 3 of 4   traZODone (DESYREL) 150 MG tablet Take 75 mg by mouth at bedtime.   TRULICITY 1.5 MG/0.5ML SOPN Inject 0.5 mg into the skin once a week. Tuesday   No current facility-administered medications for this visit. (Other)   REVIEW OF SYSTEMS: ROS   Positive for: Musculoskeletal, Endocrine, Cardiovascular, Eyes Negative for: Constitutional, Gastrointestinal, Neurological, Skin, Genitourinary, HENT, Respiratory, Psychiatric, Allergic/Imm, Heme/Lymph Last edited by Etheleen Mayhew, COT on 11/15/2023  8:28 AM.     ALLERGIES No Known Allergies  PAST MEDICAL HISTORY Past Medical History:  Diagnosis Date   CAP (community acquired pneumonia) 12/09/2021   CKD (chronic kidney disease)    GERD (gastroesophageal reflux disease)    HTN (hypertension)    Insulin dependent type 2 diabetes mellitus (HCC)    Neuropathy    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   Stroke [facility])    Past Surgical History:  Procedure Laterality Date   ABDOMINAL AORTOGRAM W/LOWER EXTREMITY N/A 06/20/2023   Procedure: ABDOMINAL AORTOGRAM W/LOWER EXTREMITY;  Surgeon: Cephus Shelling, MD;  Location: [facility] INVAS[facility];  Service:  Cardiovascular;  Laterality: N/A;   AMPUTATION Left 02/04/2021   Procedure: LEFT GREAT TOE AMPUTATION;  Surgeon: [name], MD;  Location: [facility] OR;  Service: Orthopedics;  Laterality: Left;   AMPUTATION Left 02/10/2021   Procedure: LEFT FOOT 1ST RAY AMPUTATION;  Surgeon: [name], MD;  Location: [facility] OR;  Service: Orthopedics;  Laterality: Left;   AMPUTATION Left 03/22/2021   Procedure: LEFT BELOW KNEE AMPUTATION;  Surgeon: [name], MD;  Location: [facility] OR;  Service: Orthopedics;  Laterality: Left;   APPENDECTOMY     BACK SURGERY     I & D EXTREMITY Left 03/03/2021   Procedure: LEFT FOOT DEBRIDEMENT;  Surgeon: [name], MD;  Location: [facility] OR;  Service: Orthopedics;  Laterality: Left;   INCISION AND DRAINAGE ABSCESS N/A 10/06/2021  Procedure: INCISION AND DRAINAGE BACK ABSCESS;  Surgeon: Berna Bue, MD;  Location: [facility] OR;  Service: General;  Laterality: N/A;   INJECTION OF SILICONE OIL Left 07/25/2023   Procedure: INJECTION OF SILICONE OIL;  Surgeon: Rennis Chris, MD;  Location: [facility] OR;  Service: Ophthalmology;  Laterality: Left;   MEMBRANE PEEL Left 07/25/2023   Procedure: MEMBRANE PEEL;  Surgeon: Rennis Chris, MD;  Location: [facility] OR;  Service: Ophthalmology;  Laterality: Left;   PARS PLANA VITRECTOMY Left 07/25/2023   Procedure: PARS PLANA VITRECTOMY WITH 25 GAUGE;  Surgeon: Rennis Chris, MD;  Location: [facility] OR;  Service: Ophthalmology;  Laterality: Left;   removal of back cyst     TONSILLECTOMY     FAMILY HISTORY Family History  Problem Relation Age of Onset   Diabetes Mother    Heart attack Neg Hx    SOCIAL HISTORY Social History   Tobacco Use   Smoking status: Never   Smokeless tobacco: Never  Vaping Use   Vaping status: Never Used  Substance Use Topics   Alcohol use: Not Currently   Drug use: Not Currently       OPHTHALMIC EXAM:  Base Eye Exam     Visual Acuity (Snellen - Linear)       Right Left   Dist [place] 20/100 CF at 3'   Dist ph [place] NI NI          Tonometry (Tonopen, 8:36 AM)       Right Left   Pressure 16 16         Pupils       Pupils Dark Light Shape React APD   Right PERRL 3 2 Round Brisk None   Left PERRL 5 5 Round NR None         Visual Fields       Left Right     Full   Restrictions Total superior temporal, inferior temporal, superior nasal, inferior nasal deficiencies          Neuro/Psych     Oriented x3: Yes   Mood/Affect: Normal         Dilation     Both eyes: 2.5% Phenylephrine @ 8:34 AM           Slit Lamp and Fundus Exam     External Exam       Right Left   External Normal Normal         Slit Lamp Exam       Right Left   Lids/Lashes Dermatochalasis - upper lid Dermatochalasis - upper lid   Conjunctiva/Sclera White and quiet nasal and temporal pinguecula   Cornea 1-2+ pigmented guttata, well healed cataract wound 1-2+PEE, mild arcus, well healed cataract wound, 2+ Guttata   Anterior Chamber Deep and quiet moderate depth, narrow angles   Iris Round and reactive Round and dilated, No NVI   Lens PC IOL in good position, trace posterior capsular opacification PC IOL in good position, trace Posterior capsular opacification   Anterior Vitreous Vitreous syneresis, Posterior vitreous detachment, blood stained Vitreous condensations settling inferiorly post vitrectomy, good silicone oil fill         Fundus Exam       Right Left   Disc Pink and sharp, no NVD 2+ Pallor, Sharp rim   C/D Ratio 0.5 0.3   Macula Hazy view, Flat, Blunted foveal reflex, scattered MA/DBH, +edema central and temporal macula, +exudates superior fovea flat under oil, scattered MA/DBH, +edema -- slightly improved  Vessels attenuated, Tortuous attenuated, Tortuous   Periphery Attached, scattered MA/DBH, inferior retina obsured by [facility] / blood clots Inferonasal retina reattached under oil; 360 PRP laser changes, PRE-OP: 360 MA/DBH, focal tractional RD IN quad -- focal tractional fibrosis / NVE along IN  arcades - SRF extends posteriorly to IN disc           IMAGING AND PROCEDURES  Imaging and Procedures for 11/15/2023  OCT, Retina - OU - Both Eyes       Right Eye Quality was good. Central Foveal Thickness: 349. Progression has improved. Findings include no SRF, abnormal foveal contour, epiretinal membrane, intraretinal fluid (Persistent central edema greatest temporal fovea and macula -- slightly improved, +vitreous opacities).   Left Eye Quality was good. Central Foveal Thickness: 346. Progression has improved. Findings include no SRF, abnormal foveal contour, intraretinal hyper-reflective material (Mild interval improvement in IRF / edema temporal and superior fovea and mac under silicone oil).   Notes *Images captured and stored on drive  Diagnosis / Impression:  OD: Persistent central edema greatest temporal fovea and macula -- slightly improved, +vitreous opacities OS: mild interval improvement in IRF / edema temporal and superior fovea and mac under silicone oil  Clinical management:  See below  Abbreviations: NFP - Normal foveal profile. CME - cystoid macular edema. PED - pigment epithelial detachment. IRF - intraretinal fluid. SRF - subretinal fluid. EZ - ellipsoid zone. ERM - epiretinal membrane. ORA - outer retinal atrophy. ORT - outer retinal tubulation. SRHM - subretinal hyper-reflective material. IRHM - intraretinal hyper-reflective material      Intravitreal Injection, Pharmacologic Agent - OD - Right Eye       Time Out 11/15/2023. 9:09 AM. Confirmed correct patient, procedure, site, and patient consented.   Anesthesia Topical anesthesia was used. Anesthetic medications included Lidocaine 2%, Proparacaine 0.5%.   Procedure Preparation included 5% betadine to ocular surface, eyelid speculum. A (32g) needle was used.   Injection: 1.25 mg Bevacizumab 1.25mg /0.64ml   Route: Intravitreal, Site: Right Eye   NDC: P3213405, Lot: [id number], Expiration date:  11/29/2023   Post-op Post injection exam found visual acuity of at least counting fingers. The patient tolerated the procedure well. There were no complications. The patient received written and verbal post procedure care education.      Intravitreal Injection, Pharmacologic Agent - OS - Left Eye       Time Out 11/15/2023. 9:10 AM. Confirmed correct patient, procedure, site, and patient consented.   Anesthesia Topical anesthesia was used. Anesthetic medications included Lidocaine 2%, Proparacaine 0.5%.   Procedure Preparation included 5% betadine to ocular surface, eyelid speculum. A (32g) needle was used.   Injection: 1.25 mg Bevacizumab 1.25mg /0.60ml   Route: Intravitreal, Site: Left Eye   NDC: P3213405, Lot: [id number], Expiration date: 12/19/2023   Post-op Post injection exam found visual acuity of at least counting fingers. The patient tolerated the procedure well. There were no complications. The patient received written and verbal post procedure care education.           ASSESSMENT/PLAN:   ICD-10-CM   1. 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 Diabetes mellitus treated with oral  medication (HCC)  E11.9    Z79.84     3. Encounter for long-term (current) use of insulin (HCC)  Z79.4     4. Diabetes mellitus treated with injections of non-insulin medication (HCC)  E11.9    Z79.85     5. Vitreous hemorrhage of both eyes (HCC)  H43.13     6. Retinal detachment, tractional, left eye  H33.42     7. Essential hypertension  I10     8. Hypertensive retinopathy of both eyes  H35.033     9. Pseudophakia, both eyes  Z96.1      1-5. 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 - BCVA OD 20/100 from CF; OS CF -- stable - exam shows interval improvement in diffuse VH OD; OS stably reattached under oil - OCT shows: OD: persistent central edema greatest temporal fovea and macula -- slightly improved; OS: Interval improvement in IRF / edema temporal and superior fovea and mac under silicone oil - recommend IVA OU #5 today, 02.14.25 for persistent DME - pt wishes to proceed with injections - RBA of procedure discussed, questions answered - IVA informed consent obtained and signed, 10.16.24 (OU) - see procedure note - cont PF 4x/day OS  Brimonidine BID OS PSO ung QHS OS  - post op drop and positioning instructions reviewed  - tylenol/ibuprofen for pain  - f/u 4 weeks, DFE, OCT  6. Traction retinal detachment, OS - focal traction RD IN quad--focal traction fibrosis along IN arcades, SRF extended IN disc - s/p PPV/MP/PFO/EL/SO OS, 10.24.2024 -- see above - retina reattached under oil - drops as above   7,8. Hypertensive retinopathy OU - discussed importance of tight BP control - monitor   9. Pseudophakia OU  - s/p CE/IOL OU (Dr. Zenaida Niece)  - IOLs in good position, doing well  - monitor   Ophthalmic Meds Ordered this visit:  Meds ordered this encounter  Medications   Bevacizumab (AVASTIN) SOLN 1.25 mg   Bevacizumab (AVASTIN) SOLN 1.25 mg     Return in about 4 weeks (around 12/13/2023) for f/u PDR OU, DFE, OCT.   This document  serves as a record of services personally performed by Karie Chimera, MD, PhD. It was created on their behalf by Berlin Hun COT, an ophthalmic technician. The creation of this record is the provider's dictation and/or activities during the visit.    Electronically signed by: Berlin Hun COT TODAY 02.12.25  1:52 AM  This document serves as a record of services personally performed by Karie Chimera, MD, PhD. It was created on their behalf by Glee Arvin. Manson Passey, OA an ophthalmic technician. The creation of this record is the provider's dictation and/or activities during the visit.    Electronically signed by: Glee Arvin. Manson Passey, OA 11/16/23 1:52 AM  Karie Chimera, M.D., Ph.D. Diseases & Surgery of the Retina and Vitreous [facility] 11/15/2023   I have reviewed the above documentation for accuracy  and completeness, and I agree with the above. Karie Chimera, M.D., Ph.D. 11/16/23 1:59 AM   Abbreviations: M myopia (nearsighted); A astigmatism; H hyperopia (farsighted); P presbyopia; Mrx spectacle prescription;  CTL contact lenses; OD right eye; OS left eye; OU both eyes  XT exotropia; ET esotropia; PEK punctate epithelial keratitis; PEE punctate epithelial erosions; DES dry eye syndrome; MGD meibomian gland dysfunction; ATs artificial tears; PFAT's preservative free artificial tears; NSC nuclear sclerotic cataract; PSC posterior subcapsular cataract; ERM epi-retinal membrane; PVD posterior vitreous detachment; RD retinal detachment; DM diabetes mellitus; DR diabetic retinopathy; NPDR non-proliferative diabetic retinopathy; PDR proliferative diabetic retinopathy; CSME clinically significant macular edema; DME diabetic macular edema; dbh dot blot hemorrhages; CWS cotton wool spot; POAG primary open angle glaucoma; C/D cup-to-disc ratio; HVF humphrey visual field; GVF goldmann visual field; OCT optical coherence tomography; IOP intraocular pressure; BRVO Branch  retinal vein occlusion; CRVO central retinal vein occlusion; CRAO central retinal artery occlusion; BRAO branch retinal artery occlusion; RT retinal tear; SB scleral buckle; PPV pars plana vitrectomy; VH Vitreous hemorrhage; PRP panretinal laser photocoagulation; IVK intravitreal kenalog; VMT vitreomacular traction; MH Macular hole;  NVD neovascularization of the disc; NVE neovascularization elsewhere; AREDS age related eye disease study; ARMD age related macular degeneration; POAG primary open angle glaucoma; EBMD epithelial/anterior basement membrane dystrophy; ACIOL anterior chamber intraocular lens; IOL intraocular lens; PCIOL posterior chamber intraocular lens; Phaco/IOL phacoemulsification with intraocular lens placement; PRK photorefractive keratectomy; LASIK laser assisted in situ keratomileusis; HTN hypertension; DM diabetes mellitus; COPD chronic obstructive pulmonary disease

## 2023-11-15 ENCOUNTER — Ambulatory Visit (INDEPENDENT_AMBULATORY_CARE_PROVIDER_SITE_OTHER): Payer: Medicare Other | Admitting: Ophthalmology

## 2023-11-15 ENCOUNTER — Encounter (INDEPENDENT_AMBULATORY_CARE_PROVIDER_SITE_OTHER): Payer: Self-pay | Admitting: Ophthalmology

## 2023-11-15 DIAGNOSIS — E119 Type 2 diabetes mellitus without complications: Secondary | ICD-10-CM

## 2023-11-15 DIAGNOSIS — Z794 Long term (current) use of insulin: Secondary | ICD-10-CM | POA: Diagnosis not present

## 2023-11-15 DIAGNOSIS — H3342 Traction detachment of retina, left eye: Secondary | ICD-10-CM | POA: Diagnosis not present

## 2023-11-15 DIAGNOSIS — E113513 Type 2 diabetes mellitus with proliferative diabetic retinopathy with macular edema, bilateral: Secondary | ICD-10-CM | POA: Diagnosis not present

## 2023-11-15 DIAGNOSIS — H35033 Hypertensive retinopathy, bilateral: Secondary | ICD-10-CM | POA: Diagnosis not present

## 2023-11-15 DIAGNOSIS — I10 Essential (primary) hypertension: Secondary | ICD-10-CM | POA: Diagnosis not present

## 2023-11-15 DIAGNOSIS — Z7984 Long term (current) use of oral hypoglycemic drugs: Secondary | ICD-10-CM

## 2023-11-15 DIAGNOSIS — Z961 Presence of intraocular lens: Secondary | ICD-10-CM

## 2023-11-15 DIAGNOSIS — H4313 Vitreous hemorrhage, bilateral: Secondary | ICD-10-CM | POA: Diagnosis not present

## 2023-11-15 DIAGNOSIS — H25813 Combined forms of age-related cataract, bilateral: Secondary | ICD-10-CM

## 2023-11-15 DIAGNOSIS — Z7985 Long-term (current) use of injectable non-insulin antidiabetic drugs: Secondary | ICD-10-CM

## 2023-11-15 MED ORDER — BEVACIZUMAB CHEMO INJECTION 1.25MG/0.05ML SYRINGE FOR KALEIDOSCOPE
1.2500 mg | INTRAVITREAL | Status: AC | PRN
  Administered 2023-11-15: 1.25 mg via INTRAVITREAL

## 2023-11-20 ENCOUNTER — Encounter (HOSPITAL_COMMUNITY): Payer: Self-pay | Admitting: Pharmacy Technician

## 2023-11-20 ENCOUNTER — Emergency Department (HOSPITAL_COMMUNITY): Payer: Medicare Other

## 2023-11-20 ENCOUNTER — Inpatient Hospital Stay (HOSPITAL_COMMUNITY)
Admission: EM | Admit: 2023-11-20 | Discharge: 2023-11-26 | DRG: 871 | Disposition: A | Discharge status: Skilled Nursing Facility | Payer: Medicare Other | Source: Skilled Nursing Facility | Attending: Internal Medicine | Admitting: Internal Medicine

## 2023-11-20 ENCOUNTER — Other Ambulatory Visit: Payer: Self-pay

## 2023-11-20 DIAGNOSIS — Z7984 Long term (current) use of oral hypoglycemic drugs: Secondary | ICD-10-CM

## 2023-11-20 DIAGNOSIS — Z6833 Body mass index (BMI) 33.0-33.9, adult: Secondary | ICD-10-CM | POA: Diagnosis not present

## 2023-11-20 DIAGNOSIS — J189 Pneumonia, unspecified organism: Secondary | ICD-10-CM | POA: Diagnosis present

## 2023-11-20 DIAGNOSIS — Z1152 Encounter for screening for COVID-19: Secondary | ICD-10-CM | POA: Diagnosis not present

## 2023-11-20 DIAGNOSIS — R0902 Hypoxemia: Secondary | ICD-10-CM | POA: Diagnosis present

## 2023-11-20 DIAGNOSIS — A419 Sepsis, unspecified organism: Principal | ICD-10-CM | POA: Diagnosis present

## 2023-11-20 DIAGNOSIS — Z79899 Other long term (current) drug therapy: Secondary | ICD-10-CM | POA: Diagnosis not present

## 2023-11-20 DIAGNOSIS — K219 Gastro-esophageal reflux disease without esophagitis: Secondary | ICD-10-CM | POA: Diagnosis present

## 2023-11-20 DIAGNOSIS — Z89512 Acquired absence of left leg below knee: Secondary | ICD-10-CM | POA: Diagnosis not present

## 2023-11-20 DIAGNOSIS — Z8673 Personal history of transient ischemic attack (TIA), and cerebral infarction without residual deficits: Secondary | ICD-10-CM

## 2023-11-20 DIAGNOSIS — Z794 Long term (current) use of insulin: Secondary | ICD-10-CM | POA: Diagnosis not present

## 2023-11-20 DIAGNOSIS — E1142 Type 2 diabetes mellitus with diabetic polyneuropathy: Secondary | ICD-10-CM | POA: Diagnosis present

## 2023-11-20 DIAGNOSIS — J9601 Acute respiratory failure with hypoxia: Secondary | ICD-10-CM | POA: Diagnosis present

## 2023-11-20 DIAGNOSIS — H5462 Unqualified visual loss, left eye, normal vision right eye: Secondary | ICD-10-CM | POA: Diagnosis present

## 2023-11-20 DIAGNOSIS — E119 Type 2 diabetes mellitus without complications: Secondary | ICD-10-CM | POA: Diagnosis present

## 2023-11-20 DIAGNOSIS — E1165 Type 2 diabetes mellitus with hyperglycemia: Secondary | ICD-10-CM | POA: Diagnosis present

## 2023-11-20 DIAGNOSIS — Z7985 Long-term (current) use of injectable non-insulin antidiabetic drugs: Secondary | ICD-10-CM

## 2023-11-20 DIAGNOSIS — I714 Abdominal aortic aneurysm, without rupture, unspecified: Secondary | ICD-10-CM | POA: Diagnosis present

## 2023-11-20 DIAGNOSIS — I129 Hypertensive chronic kidney disease with stage 1 through stage 4 chronic kidney disease, or unspecified chronic kidney disease: Secondary | ICD-10-CM | POA: Diagnosis present

## 2023-11-20 DIAGNOSIS — N179 Acute kidney failure, unspecified: Secondary | ICD-10-CM | POA: Diagnosis present

## 2023-11-20 DIAGNOSIS — E669 Obesity, unspecified: Secondary | ICD-10-CM | POA: Diagnosis present

## 2023-11-20 DIAGNOSIS — Z833 Family history of diabetes mellitus: Secondary | ICD-10-CM

## 2023-11-20 DIAGNOSIS — J690 Pneumonitis due to inhalation of food and vomit: Principal | ICD-10-CM | POA: Diagnosis present

## 2023-11-20 DIAGNOSIS — E1122 Type 2 diabetes mellitus with diabetic chronic kidney disease: Secondary | ICD-10-CM | POA: Diagnosis present

## 2023-11-20 LAB — D-DIMER, QUANTITATIVE: D-Dimer, Quant: 1.22 ug{FEU}/mL — ABNORMAL HIGH (ref 0.00–0.50)

## 2023-11-20 LAB — RESPIRATORY PANEL BY PCR

## 2023-11-20 LAB — CBC WITH DIFFERENTIAL/PLATELET
Abs Immature Granulocytes: 0.08 10*3/uL — ABNORMAL HIGH (ref 0.00–0.07)
Basophils Absolute: 0 10*3/uL (ref 0.0–0.1)
Basophils Relative: 0 %
Eosinophils Absolute: 0 10*3/uL (ref 0.0–0.5)
Eosinophils Relative: 0 %
HCT: 36.5 % — ABNORMAL LOW (ref 39.0–52.0)
Hemoglobin: 11.2 g/dL — ABNORMAL LOW (ref 13.0–17.0)
Immature Granulocytes: 1 %
Lymphocytes Relative: 3 %
Lymphs Abs: 0.4 10*3/uL — ABNORMAL LOW (ref 0.7–4.0)
MCH: 25.1 pg — ABNORMAL LOW (ref 26.0–34.0)
MCHC: 30.7 g/dL (ref 30.0–36.0)
MCV: 81.8 fL (ref 80.0–100.0)
Monocytes Absolute: 0.9 10*3/uL (ref 0.1–1.0)
Monocytes Relative: 6 %
Neutro Abs: 12.7 10*3/uL — ABNORMAL HIGH (ref 1.7–7.7)
Neutrophils Relative %: 90 %
Platelets: 230 10*3/uL (ref 150–400)
RBC: 4.46 MIL/uL (ref 4.22–5.81)
RDW: 15.9 % — ABNORMAL HIGH (ref 11.5–15.5)
WBC: 14.1 10*3/uL — ABNORMAL HIGH (ref 4.0–10.5)
nRBC: 0 % (ref 0.0–0.2)

## 2023-11-20 LAB — COMPREHENSIVE METABOLIC PANEL
ALT: 37 U/L (ref 0–44)
AST: 44 U/L — ABNORMAL HIGH (ref 15–41)
Albumin: 3.3 g/dL — ABNORMAL LOW (ref 3.5–5.0)
Alkaline Phosphatase: 91 U/L (ref 38–126)
Anion gap: 10 (ref 5–15)
BUN: 30 mg/dL — ABNORMAL HIGH (ref 6–20)
CO2: 24 mmol/L (ref 22–32)
Calcium: 8.7 mg/dL — ABNORMAL LOW (ref 8.9–10.3)
Chloride: 104 mmol/L (ref 98–111)
Creatinine, Ser: 1.84 mg/dL — ABNORMAL HIGH (ref 0.61–1.24)
GFR, Estimated: 41 mL/min — ABNORMAL LOW (ref >60)
Glucose, Bld: 294 mg/dL — ABNORMAL HIGH (ref 70–99)
Potassium: 4.4 mmol/L (ref 3.5–5.1)
Sodium: 138 mmol/L (ref 135–145)
Total Bilirubin: 0.6 mg/dL (ref 0.0–1.2)
Total Protein: 6.4 g/dL — ABNORMAL LOW (ref 6.5–8.1)

## 2023-11-20 LAB — RESP PANEL BY RT-PCR (RSV, FLU A&B, COVID)  RVPGX2
Influenza A by PCR: NEGATIVE
Influenza B by PCR: NEGATIVE
Resp Syncytial Virus by PCR: NEGATIVE
SARS Coronavirus 2 by RT PCR: NEGATIVE

## 2023-11-20 LAB — I-STAT CG4 LACTIC ACID, ED
Lactic Acid, Venous: 2 mmol/L (ref 0.5–1.9)
Lactic Acid, Venous: 1.2 mmol/L (ref 0.5–1.9)

## 2023-11-20 LAB — TROPONIN I (HIGH SENSITIVITY)
Troponin I (High Sensitivity): 14 ng/L (ref <18)
Troponin I (High Sensitivity): 14 ng/L (ref <18)

## 2023-11-20 MED ORDER — TRAZODONE HCL 50 MG PO TABS
75.0000 mg | ORAL_TABLET | Freq: Every day | ORAL | Status: DC
  Administered 2023-11-21 – 2023-11-25 (×6): 75 mg via ORAL
  Filled 2023-11-20 (×6): qty 2

## 2023-11-20 MED ORDER — MELATONIN 5 MG PO TABS
10.0000 mg | ORAL_TABLET | Freq: Every day | ORAL | Status: DC
  Administered 2023-11-21 – 2023-11-25 (×6): 10 mg via ORAL
  Filled 2023-11-20 (×6): qty 2

## 2023-11-20 MED ORDER — SODIUM CHLORIDE 0.9 % IV BOLUS
1000.0000 mL | Freq: Once | INTRAVENOUS | Status: AC
  Administered 2023-11-20: 1000 mL via INTRAVENOUS

## 2023-11-20 MED ORDER — CYCLOBENZAPRINE HCL 5 MG PO TABS
5.0000 mg | ORAL_TABLET | Freq: Three times a day (TID) | ORAL | Status: DC | PRN
  Administered 2023-11-21 – 2023-11-26 (×7): 5 mg via ORAL
  Filled 2023-11-20 (×7): qty 1

## 2023-11-20 MED ORDER — MAGNESIUM HYDROXIDE 400 MG/5ML PO SUSP
30.0000 mL | Freq: Every day | ORAL | Status: DC | PRN
  Administered 2023-11-23: 30 mL via ORAL
  Filled 2023-11-20: qty 30

## 2023-11-20 MED ORDER — ATORVASTATIN CALCIUM 40 MG PO TABS
40.0000 mg | ORAL_TABLET | Freq: Every day | ORAL | Status: DC
  Administered 2023-11-21 – 2023-11-25 (×5): 40 mg via ORAL
  Filled 2023-11-20 (×5): qty 1

## 2023-11-20 MED ORDER — POLYETHYLENE GLYCOL 3350 17 G PO PACK
17.0000 g | PACK | Freq: Every day | ORAL | Status: DC
  Filled 2023-11-20 (×5): qty 1

## 2023-11-20 MED ORDER — IOHEXOL 350 MG/ML SOLN
75.0000 mL | Freq: Once | INTRAVENOUS | Status: AC | PRN
  Administered 2023-11-20: 75 mL via INTRAVENOUS

## 2023-11-20 MED ORDER — FUROSEMIDE 20 MG PO TABS
20.0000 mg | ORAL_TABLET | Freq: Every morning | ORAL | Status: DC
  Administered 2023-11-21 – 2023-11-26 (×6): 20 mg via ORAL
  Filled 2023-11-20 (×6): qty 1

## 2023-11-20 MED ORDER — SODIUM CHLORIDE 0.9 % IV SOLN
500.0000 mg | Freq: Once | INTRAVENOUS | Status: AC
  Administered 2023-11-20: 500 mg via INTRAVENOUS
  Filled 2023-11-20: qty 5

## 2023-11-20 MED ORDER — ONDANSETRON HCL 4 MG/2ML IJ SOLN: 4.0000 mg | Freq: Four times a day (QID) | INTRAMUSCULAR | Status: DC | PRN

## 2023-11-20 MED ORDER — GUAIFENESIN 100 MG/5ML PO LIQD
10.0000 mL | ORAL | Status: DC | PRN
  Administered 2023-11-24: 10 mL via ORAL
  Filled 2023-11-20 (×3): qty 10

## 2023-11-20 MED ORDER — DULOXETINE HCL 60 MG PO CPEP
60.0000 mg | ORAL_CAPSULE | Freq: Every day | ORAL | Status: DC
  Administered 2023-11-21 – 2023-11-26 (×6): 60 mg via ORAL
  Filled 2023-11-20: qty 1
  Filled 2023-11-20: qty 2
  Filled 2023-11-20 (×4): qty 1

## 2023-11-20 MED ORDER — GATIFLOXACIN 0.5 % OP SOLN
1.0000 [drp] | Freq: Two times a day (BID) | OPHTHALMIC | Status: DC
  Administered 2023-11-21 – 2023-11-26 (×12): 1 [drp] via OPHTHALMIC
  Filled 2023-11-20: qty 2.5

## 2023-11-20 MED ORDER — LOSARTAN POTASSIUM 50 MG PO TABS
50.0000 mg | ORAL_TABLET | Freq: Every day | ORAL | Status: DC
  Administered 2023-11-21 – 2023-11-26 (×6): 50 mg via ORAL
  Filled 2023-11-20 (×6): qty 1

## 2023-11-20 MED ORDER — ACETAMINOPHEN 325 MG PO TABS
650.0000 mg | ORAL_TABLET | Freq: Four times a day (QID) | ORAL | Status: DC | PRN
  Administered 2023-11-23: 650 mg via ORAL
  Filled 2023-11-20: qty 2

## 2023-11-20 MED ORDER — DULAGLUTIDE 1.5 MG/0.5ML ~~LOC~~ SOAJ
0.5000 mg | SUBCUTANEOUS | Status: DC
Start: 2023-11-20 — End: 2023-11-21

## 2023-11-20 MED ORDER — FAMOTIDINE 20 MG PO TABS
20.0000 mg | ORAL_TABLET | Freq: Every day | ORAL | Status: DC
  Administered 2023-11-21 – 2023-11-26 (×6): 20 mg via ORAL
  Filled 2023-11-20 (×6): qty 1

## 2023-11-20 MED ORDER — LACTATED RINGERS IV BOLUS (SEPSIS)
1000.0000 mL | Freq: Once | INTRAVENOUS | Status: AC
  Administered 2023-11-20: 1000 mL via INTRAVENOUS

## 2023-11-20 MED ORDER — SODIUM CHLORIDE 0.9 % IV SOLN: INTRAVENOUS | Status: DC

## 2023-11-20 MED ORDER — BACITRACIN-POLYMYXIN B 500-10000 UNIT/GM OP OINT
1.0000 | TOPICAL_OINTMENT | Freq: Four times a day (QID) | OPHTHALMIC | Status: DC
  Administered 2023-11-21 – 2023-11-26 (×22): 1 via OPHTHALMIC
  Filled 2023-11-20: qty 3.5

## 2023-11-20 MED ORDER — AMLODIPINE BESYLATE 10 MG PO TABS
10.0000 mg | ORAL_TABLET | Freq: Every morning | ORAL | Status: DC
  Administered 2023-11-21 – 2023-11-26 (×6): 10 mg via ORAL
  Filled 2023-11-20 (×3): qty 1
  Filled 2023-11-20: qty 2
  Filled 2023-11-20 (×2): qty 1

## 2023-11-20 MED ORDER — POLYVINYL ALCOHOL 1.4 % OP SOLN
4.0000 [drp] | Freq: Four times a day (QID) | OPHTHALMIC | Status: DC
  Administered 2023-11-21 – 2023-11-26 (×22): 4 [drp] via OPHTHALMIC
  Filled 2023-11-20: qty 15

## 2023-11-20 MED ORDER — PANTOPRAZOLE SODIUM 40 MG PO TBEC
40.0000 mg | DELAYED_RELEASE_TABLET | Freq: Every day | ORAL | Status: DC
  Administered 2023-11-21 – 2023-11-26 (×6): 40 mg via ORAL
  Filled 2023-11-20 (×6): qty 1

## 2023-11-20 MED ORDER — KETOROLAC TROMETHAMINE 0.5 % OP SOLN
1.0000 [drp] | Freq: Four times a day (QID) | OPHTHALMIC | Status: DC
  Administered 2023-11-21 – 2023-11-26 (×22): 1 [drp] via OPHTHALMIC
  Filled 2023-11-20: qty 5

## 2023-11-20 MED ORDER — BRIMONIDINE TARTRATE 0.2 % OP SOLN
1.0000 [drp] | Freq: Two times a day (BID) | OPHTHALMIC | Status: DC
  Administered 2023-11-21 – 2023-11-26 (×12): 1 [drp] via OPHTHALMIC
  Filled 2023-11-20: qty 5

## 2023-11-20 MED ORDER — LACTATED RINGERS IV SOLN: INTRAVENOUS | Status: DC

## 2023-11-20 MED ORDER — OXYCODONE HCL 5 MG PO TABS
5.0000 mg | ORAL_TABLET | ORAL | Status: AC
  Administered 2023-11-20: 5 mg via ORAL
  Filled 2023-11-20: qty 1

## 2023-11-20 MED ORDER — DAPAGLIFLOZIN PROPANEDIOL 10 MG PO TABS: 10.0000 mg | ORAL_TABLET | Freq: Every morning | ORAL | Status: DC

## 2023-11-20 MED ORDER — ENOXAPARIN SODIUM 40 MG/0.4ML IJ SOSY
40.0000 mg | PREFILLED_SYRINGE | INTRAMUSCULAR | Status: DC
  Administered 2023-11-20 – 2023-11-22 (×2): 40 mg via SUBCUTANEOUS
  Filled 2023-11-20 (×2): qty 0.4

## 2023-11-20 MED ORDER — OXYCODONE HCL 5 MG PO TABS
10.0000 mg | ORAL_TABLET | ORAL | Status: DC | PRN
  Administered 2023-11-20 – 2023-11-26 (×20): 10 mg via ORAL
  Filled 2023-11-20 (×20): qty 2

## 2023-11-20 MED ORDER — SODIUM CHLORIDE 0.9 % IV SOLN
2.0000 g | Freq: Once | INTRAVENOUS | Status: AC
  Administered 2023-11-20: 2 g via INTRAVENOUS
  Filled 2023-11-20: qty 20

## 2023-11-20 MED ORDER — GLIPIZIDE 10 MG PO TABS
10.0000 mg | ORAL_TABLET | Freq: Every morning | ORAL | Status: DC
  Administered 2023-11-21 – 2023-11-26 (×6): 10 mg via ORAL
  Filled 2023-11-20 (×6): qty 1

## 2023-11-20 MED ORDER — SERTRALINE HCL 50 MG PO TABS
50.0000 mg | ORAL_TABLET | Freq: Every day | ORAL | Status: DC
  Administered 2023-11-21 – 2023-11-26 (×6): 50 mg via ORAL
  Filled 2023-11-20 (×6): qty 1

## 2023-11-20 MED ORDER — ACETAMINOPHEN 650 MG RE SUPP: 650.0000 mg | Freq: Four times a day (QID) | RECTAL | Status: DC | PRN

## 2023-11-20 MED ORDER — VITAMIN D (ERGOCALCIFEROL) 1.25 MG (50000 UNIT) PO CAPS
50000.0000 [IU] | ORAL_CAPSULE | ORAL | Status: DC
  Administered 2023-11-22: 50000 [IU] via ORAL
  Filled 2023-11-20: qty 1

## 2023-11-20 MED ORDER — OXYCODONE HCL 5 MG PO TABS
5.0000 mg | ORAL_TABLET | ORAL | Status: DC | PRN
  Filled 2023-11-20 (×2): qty 1

## 2023-11-20 MED ORDER — SODIUM CHLORIDE 0.9 % IV SOLN
2.0000 g | Freq: Three times a day (TID) | INTRAVENOUS | Status: DC
  Administered 2023-11-20 – 2023-11-21 (×2): 2 g via INTRAVENOUS
  Filled 2023-11-20 (×2): qty 12.5

## 2023-11-20 MED ORDER — PREGABALIN 100 MG PO CAPS
100.0000 mg | ORAL_CAPSULE | Freq: Four times a day (QID) | ORAL | Status: DC
  Administered 2023-11-21 – 2023-11-26 (×22): 100 mg via ORAL
  Filled 2023-11-20 (×22): qty 1

## 2023-11-20 MED ORDER — ONDANSETRON HCL 4 MG PO TABS
4.0000 mg | ORAL_TABLET | Freq: Four times a day (QID) | ORAL | Status: DC | PRN
  Administered 2023-11-21: 4 mg via ORAL
  Filled 2023-11-20: qty 1

## 2023-11-20 MED ORDER — LINACLOTIDE 145 MCG PO CAPS
145.0000 ug | ORAL_CAPSULE | Freq: Every day | ORAL | Status: DC
  Administered 2023-11-21 – 2023-11-26 (×6): 145 ug via ORAL
  Filled 2023-11-20 (×6): qty 1

## 2023-11-20 NOTE — H&P (Addendum)
History and Physical    Patient: Brett White NGE:[id number] DOB: 1960/11/25 DOA: 11/20/2023 DOS: the patient was seen and examined on 11/20/2023 PCP: [facility]  Patient coming from: SNF  Chief Complaint:  Chief Complaint  Patient presents with   Shortness of Breath   HPI: Brett White is a 63 y.o. male with medical history significant for diabetes mellitus type 2, stroke, left BKA presents feeling terrible.  The patient tells me has had no appetite for the last for 5 days.  Yesterday he started having shortness of breath.  Shortness of breath is what brought him to the hospital.  He has had shaking chills and intermittent fevers starting yesterday.  The patient had pneumonia about a year ago but he says he feels much worse this time.  On arrival in the emergency department he was noted to have O2 sats in the 80s.  Does not normally wear oxygen and it required 4 L to get his sats above 90.  The patient recently had eye surgery and had a chest CTA to rule out PE.  His CT revealed bilateral multi lobar infiltrates.  The patient will be admitted to the hospitalist service for further IV antibiotics and oxygen as needed.   Review of Systems: As mentioned in the history of present illness. All other systems reviewed and are negative. Past Medical History:  Diagnosis Date   CAP (community acquired pneumonia) 12/09/2021   CKD (chronic kidney disease)    GERD (gastroesophageal reflux disease)    HTN (hypertension)    Insulin dependent type 2 diabetes mellitus (HCC)    Neuropathy    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   Stroke [facility])    Past Surgical History:  Procedure Laterality Date   ABDOMINAL AORTOGRAM W/LOWER EXTREMITY N/A 06/20/2023   Procedure: ABDOMINAL AORTOGRAM W/LOWER EXTREMITY;  Surgeon: Cephus Shelling, MD;  Location: [facility] INVASIVE CV LAB;  Service: Cardiovascular;  Laterality: N/A;   AMPUTATION Left 02/04/2021   Procedure: LEFT GREAT  TOE AMPUTATION;  Surgeon: [name], MD;  Location: [facility] OR;  Service: Orthopedics;  Laterality: Left;   AMPUTATION Left 02/10/2021   Procedure: LEFT FOOT 1ST RAY AMPUTATION;  Surgeon: [name], MD;  Location: [facility] OR;  Service: Orthopedics;  Laterality: Left;   AMPUTATION Left 03/22/2021   Procedure: LEFT BELOW KNEE AMPUTATION;  Surgeon: [name], MD;  Location: [facility] OR;  Service: Orthopedics;  Laterality: Left;   APPENDECTOMY     BACK SURGERY     I & D EXTREMITY Left 03/03/2021   Procedure: LEFT FOOT DEBRIDEMENT;  Surgeon: [name], MD;  Location: [facility] OR;  Service: Orthopedics;  Laterality: Left;   INCISION AND DRAINAGE ABSCESS N/A 10/06/2021   Procedure: INCISION AND DRAINAGE BACK ABSCESS;  Surgeon: Berna Bue, MD;  Location: [facility] OR;  Service: General;  Laterality: N/A;   INJECTION OF SILICONE OIL Left 07/25/2023   Procedure: INJECTION OF SILICONE OIL;  Surgeon: Rennis Chris, MD;  Location: [facility] OR;  Service: Ophthalmology;  Laterality: Left;   MEMBRANE PEEL Left 07/25/2023   Procedure: MEMBRANE PEEL;  Surgeon: Rennis Chris, MD;  Location: [facility] OR;  Service: Ophthalmology;  Laterality: Left;   PARS PLANA VITRECTOMY Left 07/25/2023   Procedure: PARS PLANA VITRECTOMY WITH 25 GAUGE;  Surgeon: Rennis Chris, MD;  Location: [facility] OR;  Service: Ophthalmology;  Laterality: Left;   removal of back cyst     TONSILLECTOMY     Social History:  reports that he has never smoked. He has never used smokeless tobacco. He reports that he does not currently use alcohol. He reports that he does not currently use drugs.  No Known Allergies  Family History  Problem Relation Age of Onset   Diabetes Mother    Heart attack Neg Hx     Prior to Admission medications   Medication Sig Start Date End Date Taking? Authorizing Provider  acetaminophen (TYLENOL) 325 MG tablet Take 2 tablets (650 mg total) by mouth every 6 (six) hours as needed for mild pain (or Fever >/= 101). 06/13/22  Yes Standley Brooking, MD  amLODipine (NORVASC) 10 MG tablet Take 10 mg by mouth every morning. 05/11/22  Yes [provider]  atorvastatin (LIPITOR) 40 MG tablet Take 40 mg by mouth at bedtime. 11/07/21  Yes [provider]  bacitracin-polymyxin b (POLYSPORIN) ophthalmic ointment Place into the right eye 4 (four) times daily. Place a 1/2 inch ribbon of ointment into the lower eyelid 4 a day and as needed for discomfort 08/01/23  Yes Rennis Chris, MD  bisacodyl (DULCOLAX) 5 MG EC tablet Take 10 mg by mouth daily as needed (CONSTIPATION).   Yes [provider]  brimonidine (ALPHAGAN) 0.2 % ophthalmic solution Place 1 drop into the left eye in the morning and at bedtime. 08/29/23  Yes [provider]  carboxymethylcellulose 1 % ophthalmic solution Place 4 drops into the left eye in the morning, at noon, in the evening, and at bedtime.   Yes [provider]  cyclobenzaprine (FLEXERIL) 5 MG tablet Take 1 tablet (5 mg total) by mouth 3 (three) times daily as needed for muscle spasms. 06/13/22  Yes Standley Brooking, MD  DULoxetine (CYMBALTA) 60 MG capsule Take 60 mg by mouth daily. 08/28/23  Yes [provider]  ergocalciferol (VITAMIN D2) 1.25 MG (50000 UT) capsule Take 50,000 Units by mouth once a week. Fridays   Yes [provider]  famotidine (PEPCID) 20 MG tablet Take 20 mg by mouth daily. 04/20/23  Yes [provider]  FARXIGA 10 MG TABS tablet Take 10 mg by mouth every morning. Dapaglifozin 02/07/22  Yes [provider]  furosemide (LASIX) 20 MG tablet Take 20 mg by mouth every morning. 05/11/22  Yes [provider]  glipiZIDE (GLUCOTROL) 10 MG tablet Take 10 mg by mouth every morning. 05/11/22  Yes [provider]  guaiFENesin (ROBITUSSIN) 100 MG/5ML liquid Take 10 mLs by mouth every 4 (four) hours as needed for cough or to loosen phlegm.   Yes [provider]  insulin lispro (HUMALOG) 100 UNIT/ML KwikPen  Inject 2-8 Units into the skin 3 (three) times daily with meals. Sliding scale 201-250=2 units 251-300=4 units 301-350=6 units 351-400= 8 units 401 or greater than=call [facility] 04/18/23  Yes [provider]  ketorolac (ACULAR) 0.4 % SOLN Place 1 drop into the left eye 4 (four) times daily.   Yes [provider]  LINZESS 145 MCG CAPS capsule Take 145 mcg by mouth daily. 06/11/23  Yes [provider]  losartan (COZAAR) 50 MG tablet Take 1 tablet (50 mg total) by mouth daily. 02/24/22  Yes Marinda Elk, MD  magnesium hydroxide (MILK OF MAGNESIA) 400 MG/5ML suspension Take 30 mLs by mouth daily as needed for mild constipation.   Yes [provider]  Melatonin 5 MG CAPS Take 10 mg by mouth at bedtime.   Yes [provider]  moxifloxacin (VIGAMOX) 0.5 % ophthalmic solution Place 1 drop  into the left eye in the morning and at bedtime. For 7 days   Yes [provider]  naloxone (NARCAN) nasal spray 4 mg/0.1 mL Place 1 spray into the nose See admin instructions. Every 2-3 minutesas needed until patient response/ emba arrived. 11/18/22  Yes Osvaldo Shipper, MD  oxyCODONE-acetaminophen (PERCOCET) 10-325 MG tablet Take 1 tablet by mouth every 4 (four) hours as needed for pain. 07/09/23  Yes [provider]  oxyCODONE-acetaminophen (PERCOCET/ROXICET) 5-325 MG tablet Take 2 tablets by mouth every 4 (four) hours as needed for moderate pain (pain score 4-6).   Yes [provider]  pantoprazole (PROTONIX) 40 MG tablet Take 1 tablet (40 mg total) by mouth daily. 10/20/21  Yes Ghimire, Werner Lean, MD  polyethylene glycol (MIRALAX / GLYCOLAX) 17 g packet Take 17 g by mouth daily. Patient taking differently: Take 17 g by mouth daily. With 4 oz of fluids and drink 11/18/22  Yes Osvaldo Shipper, MD  pregabalin (LYRICA) 100 MG capsule Take 1 capsule (100 mg total) by mouth 2 (two) times daily. Patient taking differently: Take 100 mg by mouth 4 (four)  times daily. 11/18/22  Yes Osvaldo Shipper, MD  sertraline (ZOLOFT) 50 MG tablet Take 50 mg by mouth daily. 04/30/23  Yes [provider]  traZODone (DESYREL) 150 MG tablet Take 75 mg by mouth at bedtime. 05/30/23  Yes [provider]  TRULICITY 1.5 MG/0.5ML SOPN Inject 0.5 mg into the skin once a week. Thursdays 04/04/23  Yes [provider]  hydrocortisone cream 1 % Apply 1 Application topically daily. Apply to right lower leg for wound care Patient not taking: Reported on 11/20/2023    [provider]  hydrOXYzine (ATARAX) 10 MG tablet Take 1 tablet (10 mg total) by mouth 3 (three) times daily as needed for itching. Patient not taking: Reported on 11/20/2023 03/06/23   Adonis Huguenin, NP  Polyethylene Glycol 3350 POWD Take 17 g by mouth every morning. Mix with 4 oz of fluids Patient not taking: Reported on 11/20/2023    [provider]  prednisoLONE acetate (PRED FORTE) 1 % ophthalmic suspension Place 1 drop into the left eye 4 (four) times daily. Patient not taking: Reported on 11/20/2023 08/01/23   Rennis Chris, MD  SODIUM PHOSPHATES RE Place 1 application  rectally daily. As needed constipation 3 of 4    [provider]    Physical Exam: Vitals:   11/20/23 1729 11/20/23 1730 11/20/23 1800 11/20/23 2131  BP:  (!) 144/103 103/61   Pulse:  (!) 102 95   Resp:  14 13   Temp: 98.6 F (37 C)   98.4 F (36.9 C)  SpO2:  90% 93%    Physical Exam:  General: No acute distress, well developed, well nourished HEENT: Normocephalic, atraumatic, recent eye surgery bilaterally.  He is wearing sun glasses Cardiovascular: Normal rate and rhythm. Distal pulses intact. Pulmonary: Normal pulmonary effort, fine crackles in all lung fields posteriorly Gastrointestinal: Nondistended abdomen, soft, non-tender, normoactive bowel sounds, no organomegaly Musculoskeletal:Normal ROM, trace right lower ext edema, left bka Skin: Skin is warm and dry. Neuro: No focal  deficits noted, AAOx3. PSYCH: Attentive and cooperative  Data Reviewed:  Results for orders placed or performed during the hospital encounter of 11/20/23 (from the past 24 hours)  Resp panel by RT-PCR (RSV, Flu A&B, Covid) Anterior Nasal Swab     Status: None   Collection Time: 11/20/23  2:00 PM   Specimen: Anterior Nasal Swab  Result Value Ref  Range   SARS Coronavirus 2 by RT PCR NEGATIVE NEGATIVE   Influenza A by PCR NEGATIVE NEGATIVE   Influenza B by PCR NEGATIVE NEGATIVE   Resp Syncytial Virus by PCR NEGATIVE NEGATIVE  Respiratory (~20 pathogens) panel by PCR     Status: None   Collection Time: 11/20/23  2:00 PM   Specimen: Nasopharyngeal Swab; Respiratory  Result Value Ref Range   Adenovirus NOT DETECTED NOT DETECTED   Coronavirus 229E NOT DETECTED NOT DETECTED   Coronavirus HKU1 NOT DETECTED NOT DETECTED   Coronavirus NL63 NOT DETECTED NOT DETECTED   Coronavirus OC43 NOT DETECTED NOT DETECTED   Metapneumovirus NOT DETECTED NOT DETECTED   Rhinovirus / Enterovirus NOT DETECTED NOT DETECTED   Influenza A NOT DETECTED NOT DETECTED   Influenza B NOT DETECTED NOT DETECTED   Parainfluenza Virus 1 NOT DETECTED NOT DETECTED   Parainfluenza Virus 2 NOT DETECTED NOT DETECTED   Parainfluenza Virus 3 NOT DETECTED NOT DETECTED   Parainfluenza Virus 4 NOT DETECTED NOT DETECTED   Respiratory Syncytial Virus NOT DETECTED NOT DETECTED   Bordetella pertussis NOT DETECTED NOT DETECTED   Bordetella Parapertussis NOT DETECTED NOT DETECTED   Chlamydophila pneumoniae NOT DETECTED NOT DETECTED   Mycoplasma pneumoniae NOT DETECTED NOT DETECTED  Troponin I (High Sensitivity)     Status: None   Collection Time: 11/20/23  2:10 PM  Result Value Ref Range   Troponin I (High Sensitivity) 14 <18 ng/L  Comprehensive metabolic panel     Status: Abnormal   Collection Time: 11/20/23  2:10 PM  Result Value Ref Range   Sodium 138 135 - 145 mmol/L   Potassium 4.4 3.5 - 5.1 mmol/L   Chloride 104 98 - 111  mmol/L   CO2 24 22 - 32 mmol/L   Glucose, Bld 294 (H) 70 - 99 mg/dL   BUN 30 (H) 6 - 20 mg/dL   Creatinine, Ser 1.61 (H) 0.61 - 1.24 mg/dL   Calcium 8.7 (L) 8.9 - 10.3 mg/dL   Total Protein 6.4 (L) 6.5 - 8.1 g/dL   Albumin 3.3 (L) 3.5 - 5.0 g/dL   AST 44 (H) 15 - 41 U/L   ALT 37 0 - 44 U/L   Alkaline Phosphatase 91 38 - 126 U/L   Total Bilirubin 0.6 0.0 - 1.2 mg/dL   GFR, Estimated 41 (L) >60 mL/min   Anion gap 10 5 - 15  CBC with Differential     Status: Abnormal   Collection Time: 11/20/23  2:29 PM  Result Value Ref Range   WBC 14.1 (H) 4.0 - 10.5 K/uL   RBC 4.46 4.22 - 5.81 MIL/uL   Hemoglobin 11.2 (L) 13.0 - 17.0 g/dL   HCT 09.6 (L) 04.5 - 40.9 %   MCV 81.8 80.0 - 100.0 fL   MCH 25.1 (L) 26.0 - 34.0 pg   MCHC 30.7 30.0 - 36.0 g/dL   RDW 81.1 (H) 91.4 - 78.2 %   Platelets 230 150 - 400 K/uL   nRBC 0.0 0.0 - 0.2 %   Neutrophils Relative % 90 %   Neutro Abs 12.7 (H) 1.7 - 7.7 K/uL   Lymphocytes Relative 3 %   Lymphs Abs 0.4 (L) 0.7 - 4.0 K/uL   Monocytes Relative 6 %   Monocytes Absolute 0.9 0.1 - 1.0 K/uL   Eosinophils Relative 0 %   Eosinophils Absolute 0.0 0.0 - 0.5 K/uL   Basophils Relative 0 %   Basophils Absolute 0.0 0.0 - 0.1 K/uL  Immature Granulocytes 1 %   Abs Immature Granulocytes 0.08 (H) 0.00 - 0.07 K/uL  I-Stat Lactic Acid, ED     Status: Abnormal   Collection Time: 11/20/23  2:46 PM  Result Value Ref Range   Lactic Acid, Venous 2.0 (HH) 0.5 - 1.9 mmol/L  D-dimer, quantitative     Status: Abnormal   Collection Time: 11/20/23  4:00 PM  Result Value Ref Range   D-Dimer, Quant 1.22 (H) 0.00 - 0.50 ug/mL-FEU  Troponin I (High Sensitivity)     Status: None   Collection Time: 11/20/23  4:00 PM  Result Value Ref Range   Troponin I (High Sensitivity) 14 <18 ng/L  I-Stat Lactic Acid, ED     Status: None   Collection Time: 11/20/23 10:47 PM  Result Value Ref Range   Lactic Acid, Venous 1.2 0.5 - 1.9 mmol/L     Assessment and Plan: Acute respiratory  failure secondary to multilobar pneumonia -  - His respiratory panel is negative.  This is presumed to be a bacterial pneumonia and he has been started on Maxipime and Zithromax -Continue oxygen as needed.  He is currently requiring 4 L O2 nasal cannula - Blood cultures pending  2. 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  He has seen vascular surgeon, Sherald Hess to assess the circulation in his lower extremities in the past.  3. Type 2 diabetes mellitus -continue routine medications with the addition of corrective dose insulin.  4.  Recent left eye surgeries with continued pain and drainage  - continue Percocets which he was taking as an outpatient.  He is in close follow-up with the ophthalmologist.    Advance Care Planning:   Code Status: Full Code the patient wants to be full code and names his ex-wife Marcelino Duster as his Social research officer, government.  Consults: none  Family Communication: none  Severity of Illness: The appropriate patient status for this patient is INPATIENT. Inpatient status is judged to be reasonable and necessary in order to provide the required intensity of service to ensure the patient's safety. The patient's presenting symptoms, physical exam findings, and initial radiographic and laboratory data in the context of their chronic comorbidities is felt to place them at high risk for further clinical deterioration. Furthermore, it is not anticipated that the patient will be medically stable for discharge from the hospital within 2 midnights of admission.   * I certify that at the point of admission it is my clinical judgment that the patient will require inpatient hospital care spanning beyond 2 midnights from the point of admission due to high intensity of service, high risk for further deterioration and high frequency of surveillance required.*  Author: Buena Irish, MD 11/20/2023 10:56 PM  For on call review [email or web address].

## 2023-11-20 NOTE — ED Notes (Signed)
Just assumed care of patient. Patient is laying in bed with provider at bedside for further evaluation. Patient asked for something to drink and it was given. Patient is on full cardiac monitor with pulse ox. Patient is in no noted distress at the present time will continue to monitor for any changes.

## 2023-11-20 NOTE — ED Provider Notes (Signed)
Physical Exam  BP (!) 144/103   Pulse (!) 102   Temp 98.6 F (37 C)   Resp 14   SpO2 90%   Physical Exam Vitals and nursing note reviewed.  Constitutional:      General: He is not in acute distress.    Appearance: He is not toxic-appearing.     Comments: Resting on stretcher  Cardiovascular:     Rate and Rhythm: Tachycardia present.  Pulmonary:     Effort: Pulmonary effort is normal. No tachypnea or respiratory distress.     Comments: On  Abdominal:     Palpations: Abdomen is soft.  Skin:    General: Skin is warm and dry.  Neurological:     Mental Status: He is alert.     Procedures  Procedures  ED Course / MDM    Medical Decision Making Amount and/or Complexity of Data Reviewed Labs: ordered. Radiology: ordered.  Risk Prescription drug management. Decision regarding hospitalization.   Accepted handoff at shift change from [facility]. Please see prior provider note for more detail.   Briefly: Patient is 63 y.o. M presenting to the ER today for evaluation of hypoxia and SOB. This patient reports some cough and reports roommate is sick as well. Plan is to follow up on labs and XR. Septic. Antibiotics and fluids initiated by previous shift for presumed respiratory illness.   CXR showed left lower lobe pneumonia.  Patient D-dimer is positive, will proceed with CT angio.  Troponins flat.  Lactic improved after fluid bolus.  CT angio shows multilobar pneumonia.  Please see image reading below for other findings including new aneurysm.  I ordered the patient additional boluses of fluid, normotensive.  I gave the patient dose if his at home pain medication for his back.  Will admit for hypoxia requiring new oxygen requirement and multilobar pneumonia.   CT Angio Chest PE W and/or Wo Contrast Result Date: 11/20/2023 CLINICAL DATA:  Pulmonary embolism (PE) suspected, low to intermediate prob, positive D-dimer Shortness of breath. EXAM: CT ANGIOGRAPHY CHEST WITH  CONTRAST TECHNIQUE: Multidetector CT imaging of the chest was performed using the standard protocol during bolus administration of intravenous contrast. Multiplanar CT image reconstructions and MIPs were obtained to evaluate the vascular anatomy. RADIATION DOSE REDUCTION: This exam was performed according to the departmental dose-optimization program which includes automated exposure control, adjustment of the mA and/or kV according to patient size and/or use of iterative reconstruction technique. CONTRAST:  75mL OMNIPAQUE IOHEXOL 350 MG/ML SOLN COMPARISON:  Radiograph earlier today, CT 06/09/2023 FINDINGS: Cardiovascular: There are no filling defects within the pulmonary arteries to suggest pulmonary embolus. Mild dilated pulmonary artery at 3.5 cm. The heart is upper normal in size. No pericardial effusion. Minimal aortic atherosclerosis. The ascending aorta is dilated to 4.1 cm. No acute aortic findings. Mediastinum/Nodes: No enlarged mediastinal or hilar lymph nodes. Patulous esophagus. Small amount of fluid in the distal esophagus. No wall thickening. Lungs/Pleura: Moderate to prominent multifocal nodular and consolidative airspace disease within all lobes of both lungs, greatest involving the lower lobes. No pleural effusion. Imaging obtained in expiration. Upper Abdomen: No acute findings. Musculoskeletal: Thoracic spondylosis. There are no acute or suspicious osseous abnormalities. Review of the MIP images confirms the above findings. IMPRESSION: 1. No pulmonary embolus. 2. Moderate to advanced multifocal nodular and consolidative airspace disease within all lobes of both lungs, typical of multifocal pneumonia. This most prominently involves the lower lobes. 3. Ascending aortic aneurysm of 4.1 cm. Recommend annual imaging followup  by CTA or MRA. This recommendation follows 2010 ACCF/AHA/AATS/ACR/ASA/SCA/SCAI/SIR/STS/SVM Guidelines for the Diagnosis and Management of Patients with Thoracic Aortic Disease.  Circulation. 2010; 121: Q034-V425. Aortic aneurysm NOS (ICD10-I71.9) 4. Dilated main pulmonary artery can be seen with pulmonary arterial hypertension. Aortic Atherosclerosis (ICD10-I70.0). Electronically Signed   By: Narda Rutherford M.D.   On: 11/20/2023 17:40   DG Chest 2 View Result Date: 11/20/2023 CLINICAL DATA:  Shortness of breath.  Hypoxia. EXAM: CHEST - 2 VIEW COMPARISON:  Chest radiograph dated 11/15/2022. FINDINGS: Left lower lobe airspace opacity concerning for pneumonia. No pleural effusion or pneumothorax. Stable cardiac silhouette. No acute osseous pathology. IMPRESSION: Left lower lobe pneumonia.  Follow-up to resolution recommended. Electronically Signed   By: Elgie Collard M.D.   On: 11/20/2023 16:26          Brett Rich, PA-C 11/20/23 2341    Brett Glaze, MD 11/21/23 Brett White

## 2023-11-20 NOTE — ED Triage Notes (Signed)
Pt [facility] with reports of shob today. EMS noted to be hypoxic in the 80s on room air, ems placed on 4L [place] with improvement to 94%. Pt febrile 100.9, given 650mg  tylenol prior to arrival.  [facility] requesting CBC, CMP, respiratory panel and 2V CXR.  Tested today for covid and flu with negative results.

## 2023-11-20 NOTE — ED Provider Notes (Addendum)
[place] [facility] AT [facility] Provider Note   CSN: [id number] Arrival date & time: 11/20/23  1339     History  Chief Complaint  Patient presents with   Shortness of Breath    Brett White is a 63 y.o. male, history of CVA,, hypertension, diabetes, who presents to the ED secondary to shortness of breath, that acutely began today.  He states he has been feeling ill recently had a dry cough, sore throat, and runny nose for the last couple days, states his roommate has been sick.  Got more short of breath today, and nursing, at his facility noticed that he was 80s.  Placed him on 4 L of O2, and he bumped up to 94%.  Called EMS, and brought him to the ED.  He denies any chest pain.    Home Medications  Prior to Admission medications   Medication Sig Start Date End Date Taking? Authorizing Provider  acetaminophen (TYLENOL) 325 MG tablet Take 2 tablets (650 mg total) by mouth every 6 (six) hours as needed for mild pain (or Fever >/= 101). 06/13/22   Standley Brooking, MD  amLODipine (NORVASC) 10 MG tablet Take 10 mg by mouth every morning. 05/11/22   [provider]  atorvastatin (LIPITOR) 40 MG tablet Take 40 mg by mouth at bedtime. 11/07/21   [provider]  bacitracin-polymyxin b (POLYSPORIN) ophthalmic ointment Place into the right eye 4 (four) times daily. Place a 1/2 inch ribbon of ointment into the lower eyelid 4 a day and as needed for discomfort 08/01/23   Rennis Chris, MD  bisacodyl (DULCOLAX) 5 MG EC tablet Take 10 mg by mouth daily as needed (CONSTIPATION).    [provider]  cyclobenzaprine (FLEXERIL) 5 MG tablet Take 1 tablet (5 mg total) by mouth 3 (three) times daily as needed for muscle spasms. 06/13/22   Standley Brooking, MD  DULoxetine (CYMBALTA) 30 MG capsule Take 1 capsule (30 mg total) by mouth daily. Patient taking differently: Take 60 mg by mouth daily. 10/20/21   Ghimire, Werner Lean, MD  ergocalciferol (VITAMIN D2)  1.25 MG (50000 UT) capsule Take 50,000 Units by mouth once a week.    [provider]  famotidine (PEPCID) 20 MG tablet Take 20 mg by mouth daily. 04/20/23   [provider]  FARXIGA 10 MG TABS tablet Take 10 mg by mouth every morning. Dapaglifozin 02/07/22   [provider]  furosemide (LASIX) 20 MG tablet Take 20 mg by mouth every morning. 05/11/22   [provider]  glipiZIDE (GLUCOTROL) 10 MG tablet Take 10 mg by mouth every morning. 05/11/22   [provider]  hydrocortisone cream 1 % Apply 1 Application topically daily. Apply to right lower leg for wound care    [provider]  hydrOXYzine (ATARAX) 10 MG tablet Take 1 tablet (10 mg total) by mouth 3 (three) times daily as needed for itching. 03/06/23   Adonis Huguenin, NP  insulin lispro (HUMALOG) 100 UNIT/ML KwikPen Inject 2-8 Units into the skin 3 (three) times daily with meals. Sliding scale 201-250=2 units 251-300=4 units 301-350=6 units 351-400= 8 units 401 or greater than=call [facility] 04/18/23   [provider]  LINZESS 145 MCG CAPS capsule Take 145 mcg by mouth daily. 06/11/23   [provider]  losartan (COZAAR) 50 MG tablet Take 1 tablet (50 mg total) by mouth daily. 02/24/22   Marinda Elk, MD  magnesium hydroxide (MILK OF MAGNESIA) 400 MG/5ML  suspension Take 30 mLs by mouth daily as needed for mild constipation.    [provider]  Melatonin 5 MG CAPS Take 5 mg by mouth at bedtime.    [provider]  naloxone [facility]) nasal spray 4 mg/0.1 mL Place 1 spray into the nose See admin instructions. Every 2-3 minutesas needed until patient response/ emba arrived. 11/18/22   Osvaldo Shipper, MD  oxyCODONE-acetaminophen (PERCOCET) 10-325 MG tablet Take 1 tablet by mouth every 4 (four) hours as needed for pain. 07/09/23   [provider]  pantoprazole (PROTONIX) 40 MG tablet Take 1 tablet (40 mg total) by mouth daily. 10/20/21   Ghimire, Werner Lean,  MD  polyethylene glycol (MIRALAX / GLYCOLAX) 17 g packet Take 17 g by mouth daily. Patient taking differently: Take 17 g by mouth daily. With 4 oz of fluids and drink 11/18/22   Osvaldo Shipper, MD  Polyethylene Glycol 3350 POWD Take 17 g by mouth every morning. Mix with 4 oz of fluids    [provider]  prednisoLONE acetate (PRED FORTE) 1 % ophthalmic suspension Place 1 drop into the left eye 4 (four) times daily. 08/01/23   Rennis Chris, MD  pregabalin (LYRICA) 100 MG capsule Take 1 capsule (100 mg total) by mouth 2 (two) times daily. Patient taking differently: Take 100 mg by mouth 4 (four) times daily. 11/18/22   Osvaldo Shipper, MD  sertraline (ZOLOFT) 50 MG tablet Take 50 mg by mouth daily. 04/30/23   [provider]  SODIUM PHOSPHATES RE Place 1 application  rectally daily. As needed constipation 3 of 4    [provider]  traZODone (DESYREL) 150 MG tablet Take 75 mg by mouth at bedtime. 05/30/23   [provider]  TRULICITY 1.5 MG/0.5ML SOPN Inject 0.5 mg into the skin once a week. Tuesday 04/04/23   [provider]      Allergies    Patient has no known allergies.    Review of Systems   Review of Systems  Respiratory:  Positive for shortness of breath.   Cardiovascular:  Negative for chest pain.    Physical Exam Updated Vital Signs BP 110/78   Pulse (!) 117   Temp 98.8 F (37.1 C)   Resp 18   SpO2 93%  Physical Exam Vitals and nursing note reviewed.  Constitutional:      General: He is not in acute distress.    Appearance: He is well-developed.  HENT:     Head: Normocephalic and atraumatic.  Eyes:     Conjunctiva/sclera: Conjunctivae normal.  Cardiovascular:     Rate and Rhythm: Normal rate and regular rhythm.     Heart sounds: No murmur heard. Pulmonary:     Effort: Pulmonary effort is normal. No respiratory distress.     Breath sounds: Examination of the right-upper field reveals rales. Examination of the left-lower field  reveals rales. Rales present.  Abdominal:     Palpations: Abdomen is soft.     Tenderness: There is no abdominal tenderness.  Musculoskeletal:        General: No swelling.     Cervical back: Neck supple.  Skin:    General: Skin is warm and dry.     Capillary Refill: Capillary refill takes less than 2 seconds.  Neurological:     Mental Status: He is alert.  Psychiatric:        Mood and Affect: Mood normal.     ED Results / Procedures / Treatments   Labs (all  labs ordered are listed, but only abnormal results are displayed) Labs Reviewed  I-STAT CG4 LACTIC ACID, ED - Abnormal; Notable for the following components:      Result Value   Lactic Acid, Venous 2.0 (*)    All other components within normal limits  RESP PANEL BY RT-PCR (RSV, FLU A&B, COVID)  [id number]  CULTURE, BLOOD (ROUTINE X 2)  CULTURE, BLOOD (ROUTINE X 2)  COMPREHENSIVE METABOLIC PANEL  CBC WITH DIFFERENTIAL/PLATELET  TROPONIN I (HIGH SENSITIVITY)    EKG None  Radiology No results found.  Procedures Procedures    Medications Ordered in ED Medications  lactated ringers infusion (has no administration in time range)  lactated ringers bolus 1,000 mL (has no administration in time range)  cefTRIAXone (ROCEPHIN) 2 g in sodium chloride 0.9 % 100 mL IVPB (has no administration in time range)  azithromycin (ZITHROMAX) 500 mg in sodium chloride 0.9 % 250 mL IVPB (has no administration in time range)    ED Course/ Medical Decision Making/ A&P                                 Medical Decision Making Patient is a 63 year old male, history of CVA, type 2 diabetes, who presents to the ED secondary shortness of breath starting today, and upper respiratory infectious symptoms, some going for the 2 days.  He is in a feel [place], and his roommate is sick.  He is on 4 L O2, and has unlabored respirations.  Is tachycardic, but overall well-appearing.  Has crackles in the right upper lobe, left lower lobe.  Was febrile, for  EMS.  We will start sepsis protocol on him as he is tachycardic, febrile, and requiring O2, does not wear any O2 at home.  Will start sepsis protocol on him, azithromycin and ceftriaxone ordered, COVID/flu test ordered as well.  He is overall well-appearing denying any chest pain.  EKG ordered.  Signed out to [facility], to follow-up on results.  Amount and/or Complexity of Data Reviewed Labs: ordered. Radiology: ordered.  Risk Prescription drug management.    Final Clinical Impression(s) / ED Diagnoses Final diagnoses:  Hypoxia    Rx / DC Orders ED Discharge Orders     None         Yanin Muhlestein, Harley Alto, PA 11/20/23 1459    Dejan Angert Elbert Ewings, PA 11/20/23 1500    Royanne Foots, DO 11/20/23 1632

## 2023-11-20 NOTE — Sepsis Progress Note (Signed)
 eLink is following this Code Sepsis.

## 2023-11-21 DIAGNOSIS — J9601 Acute respiratory failure with hypoxia: Secondary | ICD-10-CM | POA: Diagnosis not present

## 2023-11-21 LAB — GLUCOSE, CAPILLARY
Glucose-Capillary: 94 mg/dL (ref 70–99)
Glucose-Capillary: 135 mg/dL — ABNORMAL HIGH (ref 70–99)

## 2023-11-21 LAB — MAGNESIUM: Magnesium: 2 mg/dL (ref 1.7–2.4)

## 2023-11-21 LAB — COMPREHENSIVE METABOLIC PANEL
ALT: 30 U/L (ref 0–44)
AST: 27 U/L (ref 15–41)
Albumin: 2.8 g/dL — ABNORMAL LOW (ref 3.5–5.0)
Alkaline Phosphatase: 85 U/L (ref 38–126)
Anion gap: 9 (ref 5–15)
BUN: 25 mg/dL — ABNORMAL HIGH (ref 6–20)
CO2: 26 mmol/L (ref 22–32)
Calcium: 8.5 mg/dL — ABNORMAL LOW (ref 8.9–10.3)
Chloride: 103 mmol/L (ref 98–111)
Creatinine, Ser: 1.51 mg/dL — ABNORMAL HIGH (ref 0.61–1.24)
GFR, Estimated: 53 mL/min — ABNORMAL LOW (ref >60)
Glucose, Bld: 116 mg/dL — ABNORMAL HIGH (ref 70–99)
Potassium: 3.8 mmol/L (ref 3.5–5.1)
Sodium: 138 mmol/L (ref 135–145)
Total Bilirubin: 0.7 mg/dL (ref 0.0–1.2)
Total Protein: 5.8 g/dL — ABNORMAL LOW (ref 6.5–8.1)

## 2023-11-21 LAB — HEMOGLOBIN A1C
Hgb A1c MFr Bld: 8.1 % — ABNORMAL HIGH (ref 4.8–5.6)
Mean Plasma Glucose: 185.77 mg/dL

## 2023-11-21 LAB — CBG MONITORING, ED: Glucose-Capillary: 134 mg/dL — ABNORMAL HIGH (ref 70–99)

## 2023-11-21 LAB — HIV ANTIBODY (ROUTINE TESTING W REFLEX): HIV Screen 4th Generation wRfx: NONREACTIVE

## 2023-11-21 MED ORDER — INSULIN ASPART 100 UNIT/ML IJ SOLN
0.0000 [IU] | Freq: Three times a day (TID) | INTRAMUSCULAR | Status: DC
  Administered 2023-11-21 – 2023-11-22 (×2): 2 [IU] via SUBCUTANEOUS
  Administered 2023-11-23: 3 [IU] via SUBCUTANEOUS
  Administered 2023-11-23 – 2023-11-24 (×2): 2 [IU] via SUBCUTANEOUS
  Administered 2023-11-24: 3 [IU] via SUBCUTANEOUS
  Administered 2023-11-24: 2 [IU] via SUBCUTANEOUS
  Administered 2023-11-25: 5 [IU] via SUBCUTANEOUS
  Administered 2023-11-25: 3 [IU] via SUBCUTANEOUS
  Administered 2023-11-25: 2 [IU] via SUBCUTANEOUS
  Administered 2023-11-26: 3 [IU] via SUBCUTANEOUS

## 2023-11-21 MED ORDER — SODIUM CHLORIDE 0.9 % IV SOLN
2.0000 g | Freq: Every day | INTRAVENOUS | Status: DC
  Administered 2023-11-21 – 2023-11-22 (×2): 2 g via INTRAVENOUS
  Filled 2023-11-21 (×2): qty 20

## 2023-11-21 MED ORDER — SODIUM CHLORIDE 0.9 % IV SOLN
500.0000 mg | Freq: Every day | INTRAVENOUS | Status: DC
  Administered 2023-11-21 – 2023-11-24 (×4): 500 mg via INTRAVENOUS
  Filled 2023-11-21 (×4): qty 5

## 2023-11-21 MED ORDER — INSULIN ASPART 100 UNIT/ML IJ SOLN: 0.0000 [IU] | Freq: Every day | INTRAMUSCULAR | Status: DC

## 2023-11-21 NOTE — Plan of Care (Addendum)
  Problem: Education: Goal: Ability to describe self-care measures that may prevent or decrease complications (Diabetes Survival Skills Education) will improve Outcome: Progressing Goal: Individualized Educational Video(s) Outcome: Progressing   Problem: Coping: Goal: Ability to adjust to condition or change in health will improve Outcome: Progressing   Problem: Fluid Volume: Goal: Ability to maintain a balanced intake and output will improve Outcome: Progressing   Problem: Metabolic: Goal: Ability to maintain appropriate glucose levels will improve Outcome: Progressing   Problem: Nutritional: Goal: Maintenance of adequate nutrition will improve Outcome: Progressing Goal: Progress toward achieving an optimal weight will improve Outcome: Progressing   Problem: Tissue Perfusion: Goal: Adequacy of tissue perfusion will improve Outcome: Progressing   Problem: Skin Integrity: Goal: Risk for impaired skin integrity will decrease Outcome: Progressing   Problem: Education: Goal: Knowledge of General Education information will improve Description: Including pain rating scale, medication(s)/side effects and non-pharmacologic comfort measures Outcome: Progressing   Problem: Clinical Measurements: Goal: Ability to maintain clinical measurements within normal limits will improve Outcome: Progressing Goal: Will remain free from infection Outcome: Progressing Goal: Diagnostic test results will improve Outcome: Progressing Goal: Respiratory complications will improve Outcome: Progressing Goal: Cardiovascular complication will be avoided Outcome: Progressing   Problem: Coping: Goal: Level of anxiety will decrease Outcome: Progressing   Problem: Elimination: Goal: Will not experience complications related to bowel motility Outcome: Progressing Goal: Will not experience complications related to urinary retention Outcome: Progressing

## 2023-11-21 NOTE — Progress Notes (Signed)
PROGRESS NOTE    Brett White  ZOX:[id number] DOB: October 11, 1960 DOA: 11/20/2023 PCP: [facility]    Brief Narrative:   Brett White (from SNF) is a 63 y.o. male with medical history significant for diabetes mellitus type 2, stroke, left BKA presents feeling terrible.  The patient tells me  he has had no appetite for the last for 5 days.  Yesterday he started having shortness of breath.  Shortness of breath is what brought him to the hospital.  He has had shaking chills and intermittent fevers starting yesterday.  The patient had pneumonia about a year ago but he says he feels much worse this time.  On arrival in the emergency department he was noted to have O2 sats in the 80s.  Does not normally wear oxygen and it required 4 L to get his sats above 90.  CT revealed bilateral multi lobar infiltrates.    Assessment and Plan: Acute respiratory failure secondary to multilobar pneumonia -  - respiratory panel is negative -IV abx -wean O2 as able -check strep pna/legionella - Blood cultures pending    New 4.1 cm AAA  -incidental finding   - vascular surgeon, Sherald Hess- has seen in past    Type 2 diabetes mellitus  -SSI   Recent left eye surgeries with continued pain and drainage  - continue Percocets which he was taking as an outpatient.  He is in close follow-up with the ophthalmologist.   AKI -baseline unclear but range of (0.95-2.5 upon chart review)  Obesity Estimated body mass index is 33.36 kg/m as calculated from the following:   Height as of 08/09/23: 6\' 3"  (1.905 m).   Weight as of 08/09/23: 121.1 kg.   DVT prophylaxis: enoxaparin (LOVENOX) injection 40 mg Start: 11/20/23 2300    Code Status: Full Code   Disposition Plan:  Level of care: Progressive Status is: Inpatient Remains inpatient appropriate     Consultants:  None   Subjective: Asking for pain meds  Objective: Vitals:   11/21/23 0600 11/21/23 0615 11/21/23 0630 11/21/23 0630   BP: 117/61     Pulse: 88 88 87   Resp: 10 11 (!) 9   Temp:    97.8 F (36.6 C)  SpO2: 99% 99% 99%     Intake/Output Summary (Last 24 hours) at 11/21/2023 1031 Last data filed at 11/21/2023 0631 Gross per 24 hour  Intake 1198 ml  Output --  Net 1198 ml   There were no vitals filed for this visit.  Examination:   General: Appearance:    Obese male in no acute distress     Lungs:     respirations unlabored, no wheezing  Heart:    Normal heart rate. .    MS:   Below knee amputation of left lower extremity is noted.    Neurologic:   Awake, alert       Data Reviewed: I have personally reviewed following labs and imaging studies  CBC: Recent Labs  Lab 11/20/23 1429  WBC 14.1*  NEUTROABS 12.7*  HGB 11.2*  HCT 36.5*  MCV 81.8  PLT 230   Basic Metabolic Panel: Recent Labs  Lab 11/20/23 1410 11/21/23 0522  NA 138 138  K 4.4 3.8  CL 104 103  CO2 24 26  GLUCOSE 294* 116*  BUN 30* 25*  CREATININE 1.84* 1.51*  CALCIUM 8.7* 8.5*  MG  --  2.0   GFR: CrCl cannot be calculated (Unknown ideal weight.). Liver Function Tests: Recent Labs  Lab 11/20/23 1410 11/21/23 0522  AST 44* 27  ALT 37 30  ALKPHOS 91 85  BILITOT 0.6 0.7  PROT 6.4* 5.8*  ALBUMIN 3.3* 2.8*   No results for input(s): "LIPASE", "AMYLASE" in the last 168 hours. No results for input(s): "AMMONIA" in the last 168 hours. Coagulation Profile: No results for input(s): "INR", "PROTIME" in the last 168 hours. Cardiac Enzymes: No results for input(s): "CKTOTAL", "CKMB", "CKMBINDEX", "TROPONINI" in the last 168 hours. BNP (last 3 results) No results for input(s): "PROBNP" in the last 8760 hours. HbA1C: No results for input(s): "HGBA1C" in the last 72 hours. CBG: No results for input(s): "GLUCAP" in the last 168 hours. Lipid Profile: No results for input(s): "CHOL", "HDL", "LDLCALC", "TRIG", "CHOLHDL", "LDLDIRECT" in the last 72 hours. Thyroid Function Tests: No results for input(s): "TSH",  "T4TOTAL", "FREET4", "T3FREE", "THYROIDAB" in the last 72 hours. Anemia Panel: No results for input(s): "VITAMINB12", "FOLATE", "FERRITIN", "TIBC", "IRON", "RETICCTPCT" in the last 72 hours. Sepsis Labs: Recent Labs  Lab 11/20/23 1446 11/20/23 2247  LATICACIDVEN 2.0* 1.2    Recent Results (from the past 240 hours)  Resp panel by RT-PCR (RSV, Flu A&B, Covid) Anterior Nasal Swab     Status: None   Collection Time: 11/20/23  2:00 PM   Specimen: Anterior Nasal Swab  Result Value Ref Range Status   SARS Coronavirus 2 by RT PCR NEGATIVE NEGATIVE Final   Influenza A by PCR NEGATIVE NEGATIVE Final   Influenza B by PCR NEGATIVE NEGATIVE Final    Comment: (NOTE) The Xpert Xpress SARS-CoV-2/FLU/RSV plus assay is intended as an aid in the diagnosis of influenza from Nasopharyngeal swab specimens and should not be used as a sole basis for treatment. Nasal washings and aspirates are unacceptable for Xpert Xpress SARS-CoV-2/FLU/RSV testing.  Fact Sheet for Patients: BloggerCourse.com  Fact Sheet for Healthcare Providers: SeriousBroker.it  This test is not yet approved or cleared by the [place] FDA and has been authorized for detection and/or diagnosis of SARS-CoV-2 by FDA under an Emergency Use Authorization (EUA). This EUA will remain in effect (meaning this test can be used) for the duration of the COVID-19 declaration under Section 564(b)(1) of the Act, 21 U.S.C. section 360bbb-3(b)(1), unless the authorization is terminated or revoked.     Resp Syncytial Virus by PCR NEGATIVE NEGATIVE Final    Comment: (NOTE) Fact Sheet for Patients: BloggerCourse.com  Fact Sheet for Healthcare Providers: SeriousBroker.it  This test is not yet approved or cleared by the [place] FDA and has been authorized for detection and/or diagnosis of SARS-CoV-2 by FDA under an Emergency Use  Authorization (EUA). This EUA will remain in effect (meaning this test can be used) for the duration of the COVID-19 declaration under Section 564(b)(1) of the Act, 21 U.S.C. section 360bbb-3(b)(1), unless the authorization is terminated or revoked.  Performed at [facility], [address], [place], [place] [address]   Respiratory (~20 pathogens) panel by PCR     Status: None   Collection Time: 11/20/23  2:00 PM   Specimen: Nasopharyngeal Swab; Respiratory  Result Value Ref Range Status   Adenovirus NOT DETECTED NOT DETECTED Final   Coronavirus 229E NOT DETECTED NOT DETECTED Final    Comment: (NOTE) The Coronavirus on the Respiratory Panel, DOES NOT test for the novel  Coronavirus (2019 nCoV)    Coronavirus HKU1 NOT DETECTED NOT DETECTED Final   Coronavirus NL63 NOT DETECTED NOT DETECTED Final   Coronavirus OC43 NOT DETECTED NOT DETECTED Final  Metapneumovirus NOT DETECTED NOT DETECTED Final   Rhinovirus / Enterovirus NOT DETECTED NOT DETECTED Final   Influenza A NOT DETECTED NOT DETECTED Final   Influenza B NOT DETECTED NOT DETECTED Final   Parainfluenza Virus 1 NOT DETECTED NOT DETECTED Final   Parainfluenza Virus 2 NOT DETECTED NOT DETECTED Final   Parainfluenza Virus 3 NOT DETECTED NOT DETECTED Final   Parainfluenza Virus 4 NOT DETECTED NOT DETECTED Final   Respiratory Syncytial Virus NOT DETECTED NOT DETECTED Final   Bordetella pertussis NOT DETECTED NOT DETECTED Final   Bordetella Parapertussis NOT DETECTED NOT DETECTED Final   Chlamydophila pneumoniae NOT DETECTED NOT DETECTED Final   Mycoplasma pneumoniae NOT DETECTED NOT DETECTED Final    Comment: Performed at [facility], [address] [place], [place] [address]  Blood Culture (routine x 2)     Status: None (Preliminary result)   Collection Time: 11/20/23  2:05 PM   Specimen: BLOOD LEFT FOREARM  Result Value Ref Range Status   Specimen Description BLOOD LEFT FOREARM  Final   Special Requests    Final    BOTTLES DRAWN AEROBIC AND ANAEROBIC Blood Culture adequate volume   Culture   Final    NO GROWTH < 24 HOURS Performed at [facility], [address], [place], [place] [address]    Report Status PENDING  Incomplete  Blood Culture (routine x 2)     Status: None (Preliminary result)   Collection Time: 11/20/23  2:10 PM   Specimen: BLOOD  Result Value Ref Range Status   Specimen Description BLOOD RIGHT ANTECUBITAL  Final   Special Requests AEROBIC BOTTLE ONLY Blood Culture adequate volume  Final   Culture   Final    NO GROWTH < 24 HOURS Performed at [facility], [address], [place], [place] [address]    Report Status PENDING  Incomplete         Radiology Studies: CT Angio Chest PE W and/or Wo Contrast Result Date: 11/20/2023 CLINICAL DATA:  Pulmonary embolism (PE) suspected, low to intermediate prob, positive D-dimer Shortness of breath. EXAM: CT ANGIOGRAPHY CHEST WITH CONTRAST TECHNIQUE: Multidetector CT imaging of the chest was performed using the standard protocol during bolus administration of intravenous contrast. Multiplanar CT image reconstructions and MIPs were obtained to evaluate the vascular anatomy. RADIATION DOSE REDUCTION: This exam was performed according to the departmental dose-optimization program which includes automated exposure control, adjustment of the mA and/or kV according to patient size and/or use of iterative reconstruction technique. CONTRAST:  75mL OMNIPAQUE IOHEXOL 350 MG/ML SOLN COMPARISON:  Radiograph earlier today, CT 06/09/2023 FINDINGS: Cardiovascular: There are no filling defects within the pulmonary arteries to suggest pulmonary embolus. Mild dilated pulmonary artery at 3.5 cm. The heart is upper normal in size. No pericardial effusion. Minimal aortic atherosclerosis. The ascending aorta is dilated to 4.1 cm. No acute aortic findings. Mediastinum/Nodes: No enlarged mediastinal or hilar lymph nodes. Patulous esophagus. Small amount  of fluid in the distal esophagus. No wall thickening. Lungs/Pleura: Moderate to prominent multifocal nodular and consolidative airspace disease within all lobes of both lungs, greatest involving the lower lobes. No pleural effusion. Imaging obtained in expiration. Upper Abdomen: No acute findings. Musculoskeletal: Thoracic spondylosis. There are no acute or suspicious osseous abnormalities. Review of the MIP images confirms the above findings. IMPRESSION: 1. No pulmonary embolus. 2. Moderate to advanced multifocal nodular and consolidative airspace disease within all lobes of both lungs, typical of multifocal pneumonia. This most prominently involves the  lower lobes. 3. Ascending aortic aneurysm of 4.1 cm. Recommend annual imaging followup by CTA or MRA. This recommendation follows 2010 ACCF/AHA/AATS/ACR/ASA/SCA/SCAI/SIR/STS/SVM Guidelines for the Diagnosis and Management of Patients with Thoracic Aortic Disease. Circulation. 2010; 121: U981-X914. Aortic aneurysm NOS (ICD10-I71.9) 4. Dilated main pulmonary artery can be seen with pulmonary arterial hypertension. Aortic Atherosclerosis (ICD10-I70.0). Electronically Signed   By: Narda Rutherford M.D.   On: 11/20/2023 17:40   DG Chest 2 View Result Date: 11/20/2023 CLINICAL DATA:  Shortness of breath.  Hypoxia. EXAM: CHEST - 2 VIEW COMPARISON:  Chest radiograph dated 11/15/2022. FINDINGS: Left lower lobe airspace opacity concerning for pneumonia. No pleural effusion or pneumothorax. Stable cardiac silhouette. No acute osseous pathology. IMPRESSION: Left lower lobe pneumonia.  Follow-up to resolution recommended. Electronically Signed   By: Elgie Collard M.D.   On: 11/20/2023 16:26        Scheduled Meds:  amLODipine  10 mg Oral q morning   atorvastatin  40 mg Oral QHS   bacitracin-polymyxin b  1 Application Right Eye QID   brimonidine  1 drop Left Eye BID   dapagliflozin propanediol  10 mg Oral q morning   DULoxetine  60 mg Oral Daily   enoxaparin  (LOVENOX) injection  40 mg Subcutaneous Q24H   famotidine  20 mg Oral Daily   furosemide  20 mg Oral q morning   gatifloxacin  1 drop Left Eye BID   glipiZIDE  10 mg Oral q morning   ketorolac  1 drop Left Eye QID   linaclotide  145 mcg Oral Daily   losartan  50 mg Oral Daily   melatonin  10 mg Oral QHS   pantoprazole  40 mg Oral Daily   polyethylene glycol  17 g Oral Daily   polyvinyl alcohol  4 drop Left Eye QID   pregabalin  100 mg Oral QID   sertraline  50 mg Oral Daily   traZODone  75 mg Oral QHS   [START ON 11/22/2023] Vitamin D (Ergocalciferol)  50,000 Units Oral Weekly   Continuous Infusions:  sodium chloride 40 mL/hr at 11/20/23 2132   ceFEPime (MAXIPIME) IV Stopped (11/21/23 0631)     LOS: 1 day    Time spent: 45 minutes spent on chart review, discussion with nursing staff, consultants, updating family and interview/physical exam; more than 50% of that time was spent in counseling and/or coordination of care.    Joseph Art, DO [facility] Available via Epic secure chat 7am-7pm After these hours, please refer to coverage provider listed on [email or web address] 11/21/2023, 10:31 AM

## 2023-11-21 NOTE — Progress Notes (Signed)
Patient arrived on floor

## 2023-11-22 DIAGNOSIS — J9601 Acute respiratory failure with hypoxia: Secondary | ICD-10-CM | POA: Diagnosis not present

## 2023-11-22 DIAGNOSIS — J189 Pneumonia, unspecified organism: Secondary | ICD-10-CM | POA: Diagnosis not present

## 2023-11-22 LAB — CBC
HCT: 40.2 % (ref 39.0–52.0)
Hemoglobin: 12.3 g/dL — ABNORMAL LOW (ref 13.0–17.0)
MCH: 25.2 pg — ABNORMAL LOW (ref 26.0–34.0)
MCHC: 30.6 g/dL (ref 30.0–36.0)
MCV: 82.4 fL (ref 80.0–100.0)
Platelets: 254 10*3/uL (ref 150–400)
RBC: 4.88 MIL/uL (ref 4.22–5.81)
RDW: 16.4 % — ABNORMAL HIGH (ref 11.5–15.5)
WBC: 12.6 10*3/uL — ABNORMAL HIGH (ref 4.0–10.5)
nRBC: 0 % (ref 0.0–0.2)

## 2023-11-22 LAB — BASIC METABOLIC PANEL
Anion gap: 11 (ref 5–15)
BUN: 20 mg/dL (ref 6–20)
CO2: 26 mmol/L (ref 22–32)
Calcium: 9 mg/dL (ref 8.9–10.3)
Chloride: 103 mmol/L (ref 98–111)
Creatinine, Ser: 1.46 mg/dL — ABNORMAL HIGH (ref 0.61–1.24)
GFR, Estimated: 55 mL/min — ABNORMAL LOW (ref >60)
Glucose, Bld: 114 mg/dL — ABNORMAL HIGH (ref 70–99)
Potassium: 4.5 mmol/L (ref 3.5–5.1)
Sodium: 140 mmol/L (ref 135–145)

## 2023-11-22 LAB — GLUCOSE, CAPILLARY
Glucose-Capillary: 102 mg/dL — ABNORMAL HIGH (ref 70–99)
Glucose-Capillary: 129 mg/dL — ABNORMAL HIGH (ref 70–99)
Glucose-Capillary: 135 mg/dL — ABNORMAL HIGH (ref 70–99)

## 2023-11-22 MED ORDER — PREDNISOLONE ACETATE 1 % OP SUSP
1.0000 [drp] | Freq: Four times a day (QID) | OPHTHALMIC | Status: DC
  Administered 2023-11-22 – 2023-11-26 (×15): 1 [drp] via OPHTHALMIC
  Filled 2023-11-22: qty 5

## 2023-11-22 MED ORDER — AMPICILLIN-SULBACTAM SODIUM 3 (2-1) G IJ SOLR
3.0000 g | Freq: Four times a day (QID) | INTRAMUSCULAR | Status: DC
  Administered 2023-11-22 – 2023-11-26 (×17): 3 g via INTRAVENOUS
  Filled 2023-11-22 (×18): qty 8

## 2023-11-22 MED ORDER — ENOXAPARIN SODIUM 60 MG/0.6ML IJ SOSY
60.0000 mg | PREFILLED_SYRINGE | INTRAMUSCULAR | Status: DC
  Administered 2023-11-24: 60 mg via SUBCUTANEOUS
  Filled 2023-11-22 (×2): qty 0.6

## 2023-11-22 NOTE — TOC Initial Note (Signed)
Transition of Care [facility]) - Initial/Assessment Note    Patient Details  Name: Brett White MRN: [id number] Date of Birth: Sep 11, 1961  Transition of Care [facility]) CM/SW Contact:    Mearl Latin, LCSW Phone Number: 11/22/2023, 2:28 PM  Clinical Narrative:                 Patient admitted from [facility] long term care. CSW will continue to follow for medical stability to return.   Expected Discharge Plan: Skilled Nursing Facility Barriers to Discharge: Continued Medical Work up   Patient Goals and CMS Choice Patient states their goals for this hospitalization and ongoing recovery are:: Return to SNF          Expected Discharge Plan and Services In-house Referral: Clinical Social Work   Post Acute Care Choice: Skilled Nursing Facility Living arrangements for the past 2 months: Skilled Nursing Facility                                      Prior Living Arrangements/Services Living arrangements for the past 2 months: Skilled Nursing Facility Lives with:: Facility Resident Patient language and need for interpreter reviewed:: Yes Do you feel safe going back to the place where you live?: Yes      Need for Family Participation in Patient Care: Yes (Comment) Care giver support system in place?: Yes (comment)   Criminal Activity/Legal Involvement Pertinent to Current Situation/Hospitalization: No - Comment as needed  Activities of Daily Living   ADL Screening (condition at time of admission) Independently performs ADLs?: Yes (appropriate for developmental age) Is the patient deaf or have difficulty hearing?: No Does the patient have difficulty seeing, even when wearing glasses/contacts?: No Does the patient have difficulty concentrating, remembering, or making decisions?: No  Permission Sought/Granted Permission sought to share information with : Facility Engineer, maintenance (IT) granted to share info w AGENCY: [facility]        Emotional  Assessment Appearance:: Appears stated age     Orientation: : Oriented to Self, Oriented to Place, Oriented to  Time, Oriented to Situation Alcohol / Substance Use: Not Applicable Psych Involvement: No (comment)  Admission diagnosis:  Hypoxia [R09.02] Acute respiratory failure with hypoxia (HCC) [J96.01] Patient Active Problem List   Diagnosis Date Noted   Unspecified open wound, right foot, initial encounter 05/14/2023   Pneumonia 11/16/2022   Acute respiratory failure with hypoxia (HCC) 11/16/2022   Sepsis with acute organ dysfunction (HCC) 11/16/2022   Aspiration pneumonia (HCC) 11/16/2022   GERD without esophagitis 06/11/2022   Mixed diabetic hyperlipidemia associated with type 2 diabetes mellitus (HCC) 06/11/2022   Adjustment disorder with depressed mood 05/23/2022   Chronic hyponatremia 12/09/2021   Uncontrolled type 2 diabetes mellitus with hyperglycemia, without long-term current use of insulin (HCC) 12/09/2021   Tachycardia 12/09/2021   AKI (acute kidney injury) (HCC) 10/04/2021   S/P BKA (below knee amputation) unilateral, left (HCC) 04/05/2021   Uncontrolled type 2 diabetes mellitus with hyperglycemia (HCC) 02/03/2021   Essential hypertension 02/03/2021   Diabetic polyneuropathy associated with type 2 diabetes mellitus (HCC) 02/03/2021   PCP:  [facility]:  No Pharmacies Listed    Social Drivers of Health (SDOH) Social History: SDOH Screenings   Food Insecurity: No Food Insecurity (11/21/2023)  Housing: Low Risk  (11/21/2023)  Transportation Needs: No Transportation Needs (11/21/2023)  Utilities: Not At Risk (11/21/2023)  Tobacco Use:  Low Risk  (11/20/2023)   SDOH Interventions:     Readmission Risk Interventions    11/16/2022    2:57 PM  Readmission Risk Prevention Plan  Transportation Screening Complete  Medication Review (RN Care Manager) Complete  PCP or Specialist appointment within 3-5 days of discharge Complete  HRI or Home  Care Consult Complete  SW Recovery Care/Counseling Consult Complete  Palliative Care Screening Complete  Skilled Nursing Facility Complete

## 2023-11-22 NOTE — Progress Notes (Signed)
 Pt refused 1600 blood sugar check.

## 2023-11-22 NOTE — [place] FL2 (Signed)
[place] MEDICAID FL2 LEVEL OF CARE FORM     IDENTIFICATION  Patient Name: Brett White Birthdate: 09/28/1961 Sex: male Admission Date (Current Location): 11/20/2023  [facility] and [place] Number:  Producer, television/film/video and Address:  [facility], [address], [place], [place] [address]      Provider Number: [id number]  Attending Physician Name and Address:  Elgergawy, Leana Roe, MD  Relative Name and Phone Number:       Current Level of Care: Hospital Recommended Level of Care: Skilled Nursing Facility Prior Approval Number:    Date Approved/Denied:   PASRR Number: [id number]  Discharge Plan: SNF    Current Diagnoses: Patient Active Problem List   Diagnosis Date Noted   Unspecified open wound, right foot, initial encounter 05/14/2023   Pneumonia 11/16/2022   Acute respiratory failure with hypoxia (HCC) 11/16/2022   Sepsis with acute organ dysfunction (HCC) 11/16/2022   Aspiration pneumonia (HCC) 11/16/2022   GERD without esophagitis 06/11/2022   Mixed diabetic hyperlipidemia associated with type 2 diabetes mellitus (HCC) 06/11/2022   Adjustment disorder with depressed mood 05/23/2022   Chronic hyponatremia 12/09/2021   Uncontrolled type 2 diabetes mellitus with hyperglycemia, without long-term current use of insulin (HCC) 12/09/2021   Tachycardia 12/09/2021   AKI (acute kidney injury) (HCC) 10/04/2021   S/P BKA (below knee amputation) unilateral, left (HCC) 04/05/2021   Uncontrolled type 2 diabetes mellitus with hyperglycemia (HCC) 02/03/2021   Essential hypertension 02/03/2021   Diabetic polyneuropathy associated with type 2 diabetes mellitus (HCC) 02/03/2021    Orientation RESPIRATION BLADDER Height & Weight     Self, Situation, Time, Place  Normal Continent Weight: 266 lb (120.7 kg) Height:     BEHAVIORAL SYMPTOMS/MOOD NEUROLOGICAL BOWEL NUTRITION STATUS      Continent Diet (See dc summary)  AMBULATORY STATUS COMMUNICATION OF  NEEDS Skin   Extensive Assist Verbally Normal                       Personal Care Assistance Level of Assistance  Bathing, Feeding, Dressing Bathing Assistance: Maximum assistance Feeding assistance: Limited assistance Dressing Assistance: Limited assistance     Functional Limitations Info             SPECIAL CARE FACTORS FREQUENCY                       Contractures Contractures Info: Not present    Additional Factors Info  Code Status, Allergies, Psychotropic Code Status Info: full Allergies Info: NKA Psychotropic Info: see dc summary         Current Medications (11/22/2023):  This is the current hospital active medication list Current Facility-Administered Medications  Medication Dose Route Frequency Provider Last Rate Last Admin   acetaminophen (TYLENOL) tablet 650 mg  650 mg Oral Q6H PRN Buena Irish, MD       Or   acetaminophen (TYLENOL) suppository 650 mg  650 mg Rectal Q6H PRN Buena Irish, MD       amLODipine (NORVASC) tablet 10 mg  10 mg Oral q morning Buena Irish, MD   10 mg at 11/22/23 0849   Ampicillin-Sulbactam (UNASYN) 3 g in sodium chloride 0.9 % 100 mL IVPB  3 g Intravenous Q6H Elgergawy, Leana Roe, MD   Stopped at 11/22/23 1300   atorvastatin (LIPITOR) tablet 40 mg  40 mg Oral QHS Buena Irish, MD   40 mg at 11/21/23 2151   azithromycin (ZITHROMAX) 500 mg in sodium  chloride 0.9 % 250 mL IVPB  500 mg Intravenous Daily Marlin Canary U, DO 250 mL/hr at 11/22/23 0913 500 mg at 11/22/23 1610   bacitracin-polymyxin b (POLYSPORIN) ophthalmic ointment 1 Application  1 Application Right Eye QID Buena Irish, MD   1 Application at 11/22/23 1456   brimonidine (ALPHAGAN) 0.2 % ophthalmic solution 1 drop  1 drop Left Eye BID Buena Irish, MD   1 drop at 11/22/23 0853   cyclobenzaprine (FLEXERIL) tablet 5 mg  5 mg Oral TID PRN Buena Irish, MD   5 mg at 11/21/23 0113   DULoxetine (CYMBALTA) DR capsule 60 mg  60 mg  Oral Daily Buena Irish, MD   60 mg at 11/22/23 0849   enoxaparin (LOVENOX) injection 60 mg  60 mg Subcutaneous Q24H Elgergawy, Leana Roe, MD       famotidine (PEPCID) tablet 20 mg  20 mg Oral Daily Buena Irish, MD   20 mg at 11/22/23 0849   furosemide (LASIX) tablet 20 mg  20 mg Oral q morning Buena Irish, MD   20 mg at 11/22/23 0849   gatifloxacin (ZYMAXID) 0.5 % ophthalmic drops 1 drop  1 drop Left Eye BID Buena Irish, MD   1 drop at 11/22/23 0853   glipiZIDE (GLUCOTROL) tablet 10 mg  10 mg Oral q morning Buena Irish, MD   10 mg at 11/22/23 0901   guaiFENesin (ROBITUSSIN) 100 MG/5ML liquid 10 mL  10 mL Oral Q4H PRN Buena Irish, MD       insulin aspart (novoLOG) injection 0-15 Units  0-15 Units Subcutaneous TID WC Marlin Canary U, DO   2 Units at 11/22/23 1222   insulin aspart (novoLOG) injection 0-5 Units  0-5 Units Subcutaneous QHS Vann, Jessica U, DO       ketorolac (ACULAR) 0.5 % ophthalmic solution 1 drop  1 drop Left Eye QID Buena Irish, MD   1 drop at 11/22/23 1456   linaclotide (LINZESS) capsule 145 mcg  145 mcg Oral Daily Buena Irish, MD   145 mcg at 11/22/23 0849   losartan (COZAAR) tablet 50 mg  50 mg Oral Daily Buena Irish, MD   50 mg at 11/22/23 0848   magnesium hydroxide (MILK OF MAGNESIA) suspension 30 mL  30 mL Oral Daily PRN Buena Irish, MD       melatonin tablet 10 mg  10 mg Oral QHS Buena Irish, MD   10 mg at 11/21/23 2151   ondansetron [facility]) tablet 4 mg  4 mg Oral Q6H PRN Buena Irish, MD   4 mg at 11/21/23 1117   Or   ondansetron (ZOFRAN) injection 4 mg  4 mg Intravenous Q6H PRN Buena Irish, MD       oxyCODONE (Oxy IR/ROXICODONE) immediate release tablet 10 mg  10 mg Oral Q4H PRN Buena Irish, MD   10 mg at 11/21/23 2235   oxyCODONE (Oxy IR/ROXICODONE) immediate release tablet 5 mg  5 mg Oral Q4H PRN Buena Irish, MD       pantoprazole (PROTONIX) EC tablet 40 mg  40 mg Oral  Daily Buena Irish, MD   40 mg at 11/22/23 0849   polyethylene glycol (MIRALAX / GLYCOLAX) packet 17 g  17 g Oral Daily Buena Irish, MD       polyvinyl alcohol (LIQUIFILM TEARS) 1.4 % ophthalmic solution 4 drop  4 drop Left Eye QID Buena Irish, MD   4 drop at 11/22/23 1456   prednisoLONE acetate (PRED FORTE) 1 % ophthalmic suspension 1 drop  1 drop Left Eye QID Elgergawy, Leana Roe, MD       pregabalin (LYRICA) capsule 100 mg  100 mg Oral QID Buena Irish, MD   100 mg at 11/22/23 1456   sertraline (ZOLOFT) tablet 50 mg  50 mg Oral Daily Buena Irish, MD   50 mg at 11/22/23 0849   traZODone (DESYREL) tablet 75 mg  75 mg Oral QHS Buena Irish, MD   75 mg at 11/21/23 2151   Vitamin D (Ergocalciferol) (DRISDOL) 1.25 MG (50000 UNIT) capsule 50,000 Units  50,000 Units Oral Weekly Buena Irish, MD   50,000 Units at 11/22/23 1610     Discharge Medications: Please see discharge summary for a list of discharge medications.  Relevant Imaging Results:  Relevant Lab Results:   Additional Information SS# [id number]  Mearl Latin, LCSW

## 2023-11-22 NOTE — Progress Notes (Signed)
PROGRESS NOTE    Brett White  ZOX:[id number] DOB: 10-21-1960 DOA: 11/20/2023 PCP: [facility]    Brief Narrative:    Brett White (from SNF) is a 63 y.o. male with medical history significant for diabetes mellitus type 2, stroke, left BKA presents feeling terrible.  The patient tells me  he has had no appetite for the last for 5 days.  Yesterday he started having shortness of breath.  Shortness of breath is what brought him to the hospital.  He has had shaking chills and intermittent fevers starting yesterday.  The patient had pneumonia about a year ago but he says he feels much worse this time.  On arrival in the emergency department he was noted to have O2 sats in the 80s.  Does not normally wear oxygen and it required 4 L to get his sats above 90.  CT revealed bilateral multi lobar infiltrates.    Assessment and Plan: Acute respiratory failure secondary to multilobar pneumonia -  - respiratory panel is negative -Concern for aspiration pneumonia as he reports cough with eating, will change antibiotic coverage to IV Unasyn and azithromycin -He was encouraged to use incentive spirometry and flutter valve -check strep pna/legionella - Blood cultures pending, so far no growth to date -Will consult SLP    New 4.1 cm AAA  -incidental finding   - vascular surgeon, Sherald Hess- has seen in past -Will need annual imaging    Type 2 diabetes mellitus  -SSI during hospital stay, will keep holding home [place] and [place].   Recent left eye surgeries -Still reports pain in the eye, but no significant change, as significantly poor vision in the left eye at baseline. -I have reached out to Dr. Howell Pringle  office to see if any further recommendations at this point.   AKI -baseline unclear but range of (0.95-2.5 upon chart review)   Hyperipidemia -continue with Lipitor  Hypertension -Continue with home medications  Status post left BKA -Will consult PT OT, he  is a long term resident at SNF/  Obesity Estimated body mass index is 33.25 kg/m as calculated from the following:   Height as of 08/09/23: 6\' 3"  (1.905 m).   Weight as of this encounter: 120.7 kg.   DVT prophylaxis:     Code Status: Full Code   Disposition Plan:  Level of care: Telemetry Medical Status is: Inpatient Remains inpatient appropriate     Consultants:  None   Subjective: No significant events overnight as discussed with staff, patient reports coughing episodes while eating  Objective: Vitals:   11/22/23 0754 11/22/23 0849 11/22/23 1123 11/22/23 1125  BP: (!) 150/89 (!) 151/85  111/80  Pulse: 97   91  Resp: 13   15  Temp: 97.6 F (36.4 C)   97.7 F (36.5 C)  TempSrc: Oral   Oral  SpO2:    91%  Weight:   120.7 kg     Intake/Output Summary (Last 24 hours) at 11/22/2023 1203 Last data filed at 11/22/2023 8119 Gross per 24 hour  Intake 355.19 ml  Output 1075 ml  Net -719.81 ml   Filed Weights   11/22/23 1123  Weight: 120.7 kg    Examination:  Awake Alert, Oriented X 3, appears older than stated age Poor vision in left eye, but no discharge or erythema noted Symmetrical Chest wall movement, scattered Rales bilaterally RRR,No Gallops,Rubs or new Murmurs, No Parasternal Heave +ve B.Sounds, Abd Soft, No tenderness, No rebound - guarding or rigidity. No Cyanosis,  left BKA   Data Reviewed: I have personally reviewed following labs and imaging studies  CBC: Recent Labs  Lab 11/20/23 1429 11/22/23 0753  WBC 14.1* 12.6*  NEUTROABS 12.7*  --   HGB 11.2* 12.3*  HCT 36.5* 40.2  MCV 81.8 82.4  PLT 230 254   Basic Metabolic Panel: Recent Labs  Lab 11/20/23 1410 11/21/23 0522 11/22/23 0753  NA 138 138 140  K 4.4 3.8 4.5  CL 104 103 103  CO2 24 26 26   GLUCOSE 294* 116* 114*  BUN 30* 25* 20  CREATININE 1.84* 1.51* 1.46*  CALCIUM 8.7* 8.5* 9.0  MG  --  2.0  --    GFR: Estimated Creatinine Clearance: 75.3 mL/min (A) (by C-G formula based  on SCr of 1.46 mg/dL (H)). Liver Function Tests: Recent Labs  Lab 11/20/23 1410 11/21/23 0522  AST 44* 27  ALT 37 30  ALKPHOS 91 85  BILITOT 0.6 0.7  PROT 6.4* 5.8*  ALBUMIN 3.3* 2.8*   No results for input(s): "LIPASE", "AMYLASE" in the last 168 hours. No results for input(s): "AMMONIA" in the last 168 hours. Coagulation Profile: No results for input(s): "INR", "PROTIME" in the last 168 hours. Cardiac Enzymes: No results for input(s): "CKTOTAL", "CKMB", "CKMBINDEX", "TROPONINI" in the last 168 hours. BNP (last 3 results) No results for input(s): "PROBNP" in the last 8760 hours. HbA1C: Recent Labs    11/21/23 1659  HGBA1C 8.1*   CBG: Recent Labs  Lab 11/21/23 1302 11/21/23 1646 11/21/23 2123 11/22/23 0752 11/22/23 1124  GLUCAP 134* 94 135* 102* 129*   Lipid Profile: No results for input(s): "CHOL", "HDL", "LDLCALC", "TRIG", "CHOLHDL", "LDLDIRECT" in the last 72 hours. Thyroid Function Tests: No results for input(s): "TSH", "T4TOTAL", "FREET4", "T3FREE", "THYROIDAB" in the last 72 hours. Anemia Panel: No results for input(s): "VITAMINB12", "FOLATE", "FERRITIN", "TIBC", "IRON", "RETICCTPCT" in the last 72 hours. Sepsis Labs: Recent Labs  Lab 11/20/23 1446 11/20/23 2247  LATICACIDVEN 2.0* 1.2    Recent Results (from the past 240 hours)  Resp panel by RT-PCR (RSV, Flu A&B, Covid) Anterior Nasal Swab     Status: None   Collection Time: 11/20/23  2:00 PM   Specimen: Anterior Nasal Swab  Result Value Ref Range Status   SARS Coronavirus 2 by RT PCR NEGATIVE NEGATIVE Final   Influenza A by PCR NEGATIVE NEGATIVE Final   Influenza B by PCR NEGATIVE NEGATIVE Final    Comment: (NOTE) The Xpert Xpress SARS-CoV-2/FLU/RSV plus assay is intended as an aid in the diagnosis of influenza from Nasopharyngeal swab specimens and should not be used as a sole basis for treatment. Nasal washings and aspirates are unacceptable for Xpert Xpress SARS-CoV-2/FLU/RSV testing.  Fact  Sheet for Patients: BloggerCourse.com  Fact Sheet for Healthcare Providers: SeriousBroker.it  This test is not yet approved or cleared by the [place] FDA and has been authorized for detection and/or diagnosis of SARS-CoV-2 by FDA under an Emergency Use Authorization (EUA). This EUA will remain in effect (meaning this test can be used) for the duration of the COVID-19 declaration under Section 564(b)(1) of the Act, 21 U.S.C. section 360bbb-3(b)(1), unless the authorization is terminated or revoked.     Resp Syncytial Virus by PCR NEGATIVE NEGATIVE Final    Comment: (NOTE) Fact Sheet for Patients: BloggerCourse.com  Fact Sheet for Healthcare Providers: SeriousBroker.it  This test is not yet approved or cleared by the [place] FDA and has been authorized for detection and/or diagnosis of SARS-CoV-2 by FDA under an  Emergency Use Authorization (EUA). This EUA will remain in effect (meaning this test can be used) for the duration of the COVID-19 declaration under Section 564(b)(1) of the Act, 21 U.S.C. section 360bbb-3(b)(1), unless the authorization is terminated or revoked.  Performed at [facility], [address], [place], [place] [address]   Respiratory (~20 pathogens) panel by PCR     Status: None   Collection Time: 11/20/23  2:00 PM   Specimen: Nasopharyngeal Swab; Respiratory  Result Value Ref Range Status   Adenovirus NOT DETECTED NOT DETECTED Final   Coronavirus 229E NOT DETECTED NOT DETECTED Final    Comment: (NOTE) The Coronavirus on the Respiratory Panel, DOES NOT test for the novel  Coronavirus (2019 nCoV)    Coronavirus HKU1 NOT DETECTED NOT DETECTED Final   Coronavirus NL63 NOT DETECTED NOT DETECTED Final   Coronavirus OC43 NOT DETECTED NOT DETECTED Final   Metapneumovirus NOT DETECTED NOT DETECTED Final   Rhinovirus / Enterovirus NOT DETECTED  NOT DETECTED Final   Influenza A NOT DETECTED NOT DETECTED Final   Influenza B NOT DETECTED NOT DETECTED Final   Parainfluenza Virus 1 NOT DETECTED NOT DETECTED Final   Parainfluenza Virus 2 NOT DETECTED NOT DETECTED Final   Parainfluenza Virus 3 NOT DETECTED NOT DETECTED Final   Parainfluenza Virus 4 NOT DETECTED NOT DETECTED Final   Respiratory Syncytial Virus NOT DETECTED NOT DETECTED Final   Bordetella pertussis NOT DETECTED NOT DETECTED Final   Bordetella Parapertussis NOT DETECTED NOT DETECTED Final   Chlamydophila pneumoniae NOT DETECTED NOT DETECTED Final   Mycoplasma pneumoniae NOT DETECTED NOT DETECTED Final    Comment: Performed at [facility], [address], [place], [place] [address]  Blood Culture (routine x 2)     Status: None (Preliminary result)   Collection Time: 11/20/23  2:05 PM   Specimen: BLOOD LEFT FOREARM  Result Value Ref Range Status   Specimen Description BLOOD LEFT FOREARM  Final   Special Requests   Final    BOTTLES DRAWN AEROBIC AND ANAEROBIC Blood Culture adequate volume   Culture   Final    NO GROWTH 2 DAYS Performed at [facility], [address] [place], [place] [address]    Report Status PENDING  Incomplete  Blood Culture (routine x 2)     Status: None (Preliminary result)   Collection Time: 11/20/23  2:10 PM   Specimen: BLOOD  Result Value Ref Range Status   Specimen Description BLOOD RIGHT ANTECUBITAL  Final   Special Requests AEROBIC BOTTLE ONLY Blood Culture adequate volume  Final   Culture   Final    NO GROWTH 2 DAYS Performed at [facility], [address] [place], [place] [address]    Report Status PENDING  Incomplete         Radiology Studies: CT Angio Chest PE W and/or Wo Contrast Result Date: 11/20/2023 CLINICAL DATA:  Pulmonary embolism (PE) suspected, low to intermediate prob, positive D-dimer Shortness of breath. EXAM: CT ANGIOGRAPHY CHEST WITH CONTRAST TECHNIQUE: Multidetector CT imaging of the chest  was performed using the standard protocol during bolus administration of intravenous contrast. Multiplanar CT image reconstructions and MIPs were obtained to evaluate the vascular anatomy. RADIATION DOSE REDUCTION: This exam was performed according to the departmental dose-optimization program which includes automated exposure control, adjustment of the mA and/or kV according to patient size and/or use of iterative reconstruction technique. CONTRAST:  75mL OMNIPAQUE IOHEXOL 350 MG/ML SOLN COMPARISON:  Radiograph earlier  today, CT 06/09/2023 FINDINGS: Cardiovascular: There are no filling defects within the pulmonary arteries to suggest pulmonary embolus. Mild dilated pulmonary artery at 3.5 cm. The heart is upper normal in size. No pericardial effusion. Minimal aortic atherosclerosis. The ascending aorta is dilated to 4.1 cm. No acute aortic findings. Mediastinum/Nodes: No enlarged mediastinal or hilar lymph nodes. Patulous esophagus. Small amount of fluid in the distal esophagus. No wall thickening. Lungs/Pleura: Moderate to prominent multifocal nodular and consolidative airspace disease within all lobes of both lungs, greatest involving the lower lobes. No pleural effusion. Imaging obtained in expiration. Upper Abdomen: No acute findings. Musculoskeletal: Thoracic spondylosis. There are no acute or suspicious osseous abnormalities. Review of the MIP images confirms the above findings. IMPRESSION: 1. No pulmonary embolus. 2. Moderate to advanced multifocal nodular and consolidative airspace disease within all lobes of both lungs, typical of multifocal pneumonia. This most prominently involves the lower lobes. 3. Ascending aortic aneurysm of 4.1 cm. Recommend annual imaging followup by CTA or MRA. This recommendation follows 2010 ACCF/AHA/AATS/ACR/ASA/SCA/SCAI/SIR/STS/SVM Guidelines for the Diagnosis and Management of Patients with Thoracic Aortic Disease. Circulation. 2010; 121: Z610-R604. Aortic aneurysm NOS  (ICD10-I71.9) 4. Dilated main pulmonary artery can be seen with pulmonary arterial hypertension. Aortic Atherosclerosis (ICD10-I70.0). Electronically Signed   By: Narda Rutherford M.D.   On: 11/20/2023 17:40   DG Chest 2 View Result Date: 11/20/2023 CLINICAL DATA:  Shortness of breath.  Hypoxia. EXAM: CHEST - 2 VIEW COMPARISON:  Chest radiograph dated 11/15/2022. FINDINGS: Left lower lobe airspace opacity concerning for pneumonia. No pleural effusion or pneumothorax. Stable cardiac silhouette. No acute osseous pathology. IMPRESSION: Left lower lobe pneumonia.  Follow-up to resolution recommended. Electronically Signed   By: Elgie Collard M.D.   On: 11/20/2023 16:26        Scheduled Meds:  amLODipine  10 mg Oral q morning   atorvastatin  40 mg Oral QHS   bacitracin-polymyxin b  1 Application Right Eye QID   brimonidine  1 drop Left Eye BID   DULoxetine  60 mg Oral Daily   enoxaparin (LOVENOX) injection  60 mg Subcutaneous Q24H   famotidine  20 mg Oral Daily   furosemide  20 mg Oral q morning   gatifloxacin  1 drop Left Eye BID   glipiZIDE  10 mg Oral q morning   insulin aspart  0-15 Units Subcutaneous TID WC   insulin aspart  0-5 Units Subcutaneous QHS   ketorolac  1 drop Left Eye QID   linaclotide  145 mcg Oral Daily   losartan  50 mg Oral Daily   melatonin  10 mg Oral QHS   pantoprazole  40 mg Oral Daily   polyethylene glycol  17 g Oral Daily   polyvinyl alcohol  4 drop Left Eye QID   pregabalin  100 mg Oral QID   sertraline  50 mg Oral Daily   traZODone  75 mg Oral QHS   Vitamin D (Ergocalciferol)  50,000 Units Oral Weekly   Continuous Infusions:  ampicillin-sulbactam (UNASYN) IV     azithromycin 500 mg (11/22/23 0913)     LOS: 2 days      Huey Bienenstock, MD [facility] Available via Epic secure chat 7am-7pm After these hours, please refer to coverage provider listed on [email or web address] 11/22/2023, 12:03 PM

## 2023-11-22 NOTE — Progress Notes (Signed)
Pharmacy Antibiotic Note  Brett White is a 63 y.o. male on day #3 Azithromycin and Ceftriaxone for CAP.   Pharmacy has been consulted for change from Cetriaxone to Unasyn dosing for aspiration pneumonia coverage.  Plan: Unasyn 3 gm IV q6hrs. Azithromycin 500 mg IV q24h to continue per MD  Follow renal function, culture data, clinical progress and antibiotic plans.  Weight: 120.7 kg (266 lb)  Temp (24hrs), Avg:97.9 F (36.6 C), Min:97.6 F (36.4 C), Max:98.5 F (36.9 C)  Recent Labs  Lab 11/20/23 1410 11/20/23 1429 11/20/23 1446 11/20/23 2247 11/21/23 0522 11/22/23 0753  WBC  --  14.1*  --   --   --  12.6*  CREATININE 1.84*  --   --   --  1.51* 1.46*  LATICACIDVEN  --   --  2.0* 1.2  --   --     Estimated Creatinine Clearance: 75.3 mL/min (A) (by C-G formula based on SCr of 1.46 mg/dL (H)).    No Known Allergies  Antimicrobials this admission: Azithromycin IV 2/19>> Ceftriaxone 2/19>> 2/21 Cefepime 2/19>>2/20 (received 2 doses) Unasyn 2/21>>  Dose adjustments this admission: n/a  Microbiology results: 2/19 blood: no growth x 2 days to date 2/19 resp panel: neg 2/19 COVID, flu and RSV: neg 2/20 Strep Ag: to be collected 2/20 Legionella Ag: to be collected  Thank you for allowing pharmacy to be a part of this patient's care.  Dennie Fetters, [place]  11/22/2023 11:33 AM

## 2023-11-23 DIAGNOSIS — J9601 Acute respiratory failure with hypoxia: Secondary | ICD-10-CM | POA: Diagnosis not present

## 2023-11-23 DIAGNOSIS — J189 Pneumonia, unspecified organism: Secondary | ICD-10-CM | POA: Diagnosis not present

## 2023-11-23 LAB — GLUCOSE, CAPILLARY
Glucose-Capillary: 145 mg/dL — ABNORMAL HIGH (ref 70–99)
Glucose-Capillary: 179 mg/dL — ABNORMAL HIGH (ref 70–99)
Glucose-Capillary: 141 mg/dL — ABNORMAL HIGH (ref 70–99)
Glucose-Capillary: 165 mg/dL — ABNORMAL HIGH (ref 70–99)

## 2023-11-23 LAB — CBC
HCT: 31.6 % — ABNORMAL LOW (ref 39.0–52.0)
Hemoglobin: 9.9 g/dL — ABNORMAL LOW (ref 13.0–17.0)
MCH: 25.1 pg — ABNORMAL LOW (ref 26.0–34.0)
MCHC: 31.3 g/dL (ref 30.0–36.0)
MCV: 80.2 fL (ref 80.0–100.0)
Platelets: 255 10*3/uL (ref 150–400)
RBC: 3.94 MIL/uL — ABNORMAL LOW (ref 4.22–5.81)
RDW: 15.8 % — ABNORMAL HIGH (ref 11.5–15.5)
WBC: 7.7 10*3/uL (ref 4.0–10.5)
nRBC: 0 % (ref 0.0–0.2)

## 2023-11-23 LAB — BASIC METABOLIC PANEL
Anion gap: 15 (ref 5–15)
BUN: 16 mg/dL (ref 6–20)
CO2: 28 mmol/L (ref 22–32)
Calcium: 9.4 mg/dL (ref 8.9–10.3)
Chloride: 99 mmol/L (ref 98–111)
Creatinine, Ser: 1.19 mg/dL (ref 0.61–1.24)
GFR, Estimated: >60 mL/min (ref >60)
Glucose, Bld: 142 mg/dL — ABNORMAL HIGH (ref 70–99)
Potassium: 4 mmol/L (ref 3.5–5.1)
Sodium: 142 mmol/L (ref 135–145)

## 2023-11-23 NOTE — Evaluation (Signed)
 Physical Therapy Evaluation Patient Details Name: Brett White MRN: [id number] DOB: 05/29/1961 Today's Date: 11/23/2023  History of Present Illness  63 yo male presenting to ED 2/19 with SOB and five day history of reduced appetite. CTA Chest shows moderate to advanced multifocal nodular and consolidative airspace disease within all lobes of both lungs.  PMH includes T2DM, stroke, L BKA.   Clinical Impression  Pt admitted with above diagnosis. States he resides at LTC due to prior CVA and memory deficits. He reports being ambulatory up to approx 100 feet with RW, does not use supplemental O2 at baseline. Able to self propel in w/c and perform bathing/dressing at LTC. Had a fall reportedly 6 months ago, has not had follow-up PT at LTC in at least 5 months. He is a little impulsive today, needing assist for set up with transfers and cues for safety/awareness of lines/leads. SpO2 90% while ambulating with RW and LLE prosthesis on 2L. Drops to lower 80s on RA. Moderate dyspnea with gait but recovers quickly with seated rest. Oriented to self and location but unsure of correct month/year when questioned. Favor short term SNF for post-acute rehab as it sounds he was more independent with mobility and did not require supplemental O2 PTA. Will follow and progress acutely during admission. Pt currently with functional limitations due to the deficits listed below (see PT Problem List). Pt will benefit from acute skilled PT to increase their independence and safety with mobility to allow discharge.           If plan is discharge home, recommend the following: A little help with walking and/or transfers;A little help with bathing/dressing/bathroom;Assistance with cooking/housework;Direct supervision/assist for medications management;Direct supervision/assist for financial management;Assist for transportation   Can travel by private vehicle   Yes    Equipment Recommendations None recommended by PT   Recommendations for Other Services       Functional Status Assessment Patient has had a recent decline in their functional status and demonstrates the ability to make significant improvements in function in a reasonable and predictable amount of time.     Precautions / Restrictions Precautions Precautions: Fall Recall of Precautions/Restrictions: Impaired Precaution/Restrictions Comments: Monitor O2 Required Braces or Orthoses:  (Has LLE prosthesis.) Restrictions Weight Bearing Restrictions Per Provider Order: No      Mobility  Bed Mobility Overal bed mobility: Modified Independent             General bed mobility comments: Able to get himself back in bed with management of lines by therapist only.    Transfers Overall transfer level: Needs assistance Equipment used: Rolling walker (2 wheels) Transfers: Sit to/from Stand Sit to Stand: Contact guard assist           General transfer comment: CGA for safety, cues for awareness of of lines/leads. Cues for set-up with RW. LLE prosthesis donned.    Ambulation/Gait Ambulation/Gait assistance: Contact guard assist, +2 safety/equipment Gait Distance (Feet): 100 Feet Assistive device: Rolling walker (2 wheels) Gait Pattern/deviations: Step-through pattern, Decreased stance time - left, Trunk flexed Gait velocity: decr Gait velocity interpretation: <1.31 ft/sec, indicative of household ambulator   General Gait Details: Supports himself well with use of RW for support, good prosthetic control, no signs of buckling, good foot clearance and alignment. Feels moderately short of breath, RPE of 4/10 but requesting to sit due to fatigue. SpO2 90% on 2L supplemental O2. CGA throughout for safety, +2 available to assist with management of equipment and pt safety.  Stairs  Wheelchair Mobility     Tilt Bed    Modified Rankin (Stroke Patients Only)       Balance Overall balance assessment: Needs  assistance Sitting-balance support: No upper extremity supported, Feet supported Sitting balance-Leahy Scale: Fair     Standing balance support: Reliant on assistive device for balance, Single extremity supported Standing balance-Leahy Scale: Poor Standing balance comment: More stable with BIL UE support.                             Pertinent Vitals/Pain Pain Assessment Pain Assessment: Faces Faces Pain Scale: Hurts little more Pain Location: "Lungs" Pain Descriptors / Indicators: Burning, Pressure Pain Intervention(s): Monitored during session, Repositioned    Home Living Family/patient expects to be discharged to:: Skilled nursing facility                   Additional Comments: No 02 at baseline. LTC at [facility], 1 fall 6 months ago    Prior Function Prior Level of Function : Independent/Modified Independent             Mobility Comments: Wears L proshtesis, transfer to w/c or uses prosthetic + RW about 130ft ADLs Comments: wheels himself into shower, ind for ADLs/iALDS     Extremity/Trunk Assessment   Upper Extremity Assessment Upper Extremity Assessment: Defer to OT evaluation    Lower Extremity Assessment Lower Extremity Assessment: Generalized weakness (Hx Lt BKA, has prosthesis)       Communication   Communication Communication: No apparent difficulties Factors Affecting Communication:  (Recent eye surgery bil  - low vision, wearing sunglasses)    Cognition Arousal: Alert Behavior During Therapy: WFL for tasks assessed/performed, Impulsive   PT - Cognitive impairments: No family/caregiver present to determine baseline, Orientation (Reports long term memory deficits from prior CVA)   Orientation impairments: Person, Place (Reports "January 1999" it is currently Feb 2025.)                   PT - Cognition Comments: Impulsive to rise several times prior to set-up, unaware of multiple lines. Following commands:  Impaired Following commands impaired: Follows multi-step commands with increased time     Cueing Cueing Techniques: Verbal cues, Gestural cues     General Comments General comments (skin integrity, edema, etc.): SpO2 dropped to lower 80s at rest on room air. Lower 90s on 2L at rest, and avg 90% on 2L while ambulating.    Exercises General Exercises - Lower Extremity Ankle Circles/Pumps: AROM, Right, 10 reps, Supine Quad Sets: Strengthening, Right, 5 reps, Supine   Assessment/Plan    PT Assessment Patient needs continued PT services  PT Problem List Decreased strength;Decreased activity tolerance;Decreased balance;Decreased mobility;Decreased cognition;Cardiopulmonary status limiting activity;Obesity       PT Treatment Interventions DME instruction;Gait training;Functional mobility training;Therapeutic activities;Therapeutic exercise;Balance training;Neuromuscular re-education;Cognitive remediation;Patient/family education    PT Goals (Current goals can be found in the Care Plan section)  Acute Rehab PT Goals Patient Stated Goal: Get stronger, return to baseline PT Goal Formulation: With patient Time For Goal Achievement: 12/13/23 Potential to Achieve Goals: Good    Frequency Min 1X/week     Co-evaluation PT/OT/SLP Co-Evaluation/Treatment: Yes Reason for Co-Treatment: Complexity of the patient's impairments (multi-system involvement);Necessary to address cognition/behavior during functional activity;To address functional/ADL transfers;For patient/therapist safety PT goals addressed during session: Mobility/safety with mobility;Balance;Proper use of DME;Strengthening/ROM         AM-PAC PT "6 Clicks" Mobility  Outcome Measure Help  needed turning from your back to your side while in a flat bed without using bedrails?: None Help needed moving from lying on your back to sitting on the side of a flat bed without using bedrails?: A Little Help needed moving to and from a bed to  a chair (including a wheelchair)?: A Little Help needed standing up from a chair using your arms (e.g., wheelchair or bedside chair)?: A Little Help needed to walk in hospital room?: A Little Help needed climbing 3-5 steps with a railing? : A Lot 6 Click Score: 18    End of Session Equipment Utilized During Treatment: Oxygen;Gait belt (LLE prosthesis) Activity Tolerance: Patient tolerated treatment well Patient left: in bed;with call bell/phone within reach;with bed alarm set   PT Visit Diagnosis: Unsteadiness on feet (R26.81);Other abnormalities of gait and mobility (R26.89);Muscle weakness (generalized) (M62.81)    Time: 3664-4034 PT Time Calculation (min) (ACUTE ONLY): 29 min   Charges:   PT Evaluation $PT Eval Low Complexity: 1 Low   PT General Charges $$ ACUTE PT VISIT: 1 Visit         Brett White, PT, DPT [facility]  Rehabilitation Services Physical Therapist Office: [phone] Website: [email or web address]   Berton Mount 11/23/2023, 2:28 PM

## 2023-11-23 NOTE — Evaluation (Signed)
 Occupational Therapy Evaluation and DC Summary  Patient Details Name: Brett White MRN: [id number] DOB: June 29, 1961 Today's Date: 11/23/2023   History of Present Illness   63 yo male presenting to ED 2/19 with SOB and five day history of reduced appetite. CTA Chest shows moderate to advanced multifocal nodular and consolidative airspace disease within all lobes of both lungs.  PMH includes T2DM, stroke, L BKA.     Clinical Impressions PTA pt reports residing at a SNF due to memory deficits, transferring to his w/c Mod I or using RW + Prosthesis to ambulate at a Mod I level and independently completing ADLs. Pt currently demonstrating that he needs setup assist for seated ADLs including donning prosthetic, he was CGA for OOB mobility and balance. Pt has no acute skilled OT needs, would benefit from further work with PT while in acute stay. Recommend pt return to admitting SNF when medically stable.      If plan is discharge home, recommend the following:   Assistance with cooking/housework;Direct supervision/assist for financial management;Direct supervision/assist for medications management;Supervision due to cognitive status     Functional Status Assessment   Patient has not had a recent decline in their functional status (in his ADLs)     Equipment Recommendations   None recommended by OT     Recommendations for Other Services         Precautions/Restrictions   Precautions Precautions: Fall Recall of Precautions/Restrictions: Impaired Precaution/Restrictions Comments: Monitor O2 Required Braces or Orthoses:  (Has LLE prosthesis.) Restrictions Weight Bearing Restrictions Per Provider Order: No     Mobility Bed Mobility Overal bed mobility: Modified Independent             General bed mobility comments: Able to get himself back in bed with management of lines by therapist only.    Transfers Overall transfer level: Needs assistance Equipment used:  Rolling walker (2 wheels) Transfers: Sit to/from Stand Sit to Stand: Contact guard assist           General transfer comment: CGA for safety, cues for awareness of of lines/leads. Cues for set-up with RW. LLE prosthesis donned.      Balance Overall balance assessment: Needs assistance Sitting-balance support: No upper extremity supported, Feet supported Sitting balance-Leahy Scale: Fair     Standing balance support: Reliant on assistive device for balance, Single extremity supported Standing balance-Leahy Scale: Poor Standing balance comment: More stable with BIL UE support.                           ADL either performed or assessed with clinical judgement   ADL Overall ADL's : At baseline     Grooming: Sitting;Set up       Lower Body Bathing: Set up;Sitting/lateral leans       Lower Body Dressing: Set up;Sitting/lateral leans Lower Body Dressing Details (indicate cue type and reason): don LLE prosthesis Toilet Transfer: Ambulation;+2 for safety/equipment;Contact guard assist             General ADL Comments: Reinforced IS use, pt at currently with good control     Vision Baseline Vision/History: 1 Wears glasses;6 Macular Degeneration Additional Comments: has a hx of MD, underwent a surgery 10 days ago but effects of MD are still present, wears shades and takes eye drops routinely     Perception         Praxis         Pertinent Vitals/Pain Pain Assessment Pain  Assessment: Faces Faces Pain Scale: Hurts little more Pain Location: "Lungs" Pain Descriptors / Indicators: Burning, Pressure Pain Intervention(s): Monitored during session     Extremity/Trunk Assessment Upper Extremity Assessment Upper Extremity Assessment: Overall WFL for tasks assessed   Lower Extremity Assessment Lower Extremity Assessment: Generalized weakness (Hx Lt BKA, has prosthesis)       Communication Communication Communication: No apparent  difficulties Factors Affecting Communication:  (Recent eye surgery bil - low vision, wearing sunglasses)   Cognition Arousal: Alert Behavior During Therapy: WFL for tasks assessed/performed, Impulsive                                 Following commands: Impaired Following commands impaired: Follows multi-step commands with increased time     Cueing  General Comments   Cueing Techniques: Verbal cues;Gestural cues      Exercises     Shoulder Instructions      Home Living Family/patient expects to be discharged to:: Skilled nursing facility                                 Additional Comments: No 02 at baseline. LTC at [facility], 1 fall 6 months ago      Prior Functioning/Environment Prior Level of Function : Independent/Modified Independent             Mobility Comments: Wears L proshtesis, transfer to w/c or uses prosthetic + RW about 182ft ADLs Comments: wheels himself into shower, ind for ADLs/iALDS    OT Problem List: Cardiopulmonary status limiting activity   OT Treatment/Interventions:        OT Goals(Current goals can be found in the care plan section)   Acute Rehab OT Goals Patient Stated Goal: To return to SNF OT Goal Formulation: With patient Time For Goal Achievement: 12/07/23 Potential to Achieve Goals: Good   OT Frequency:       Co-evaluation   Reason for Co-Treatment: Complexity of the patient's impairments (multi-system involvement);Necessary to address cognition/behavior during functional activity;To address functional/ADL transfers;For patient/therapist safety PT goals addressed during session: Mobility/safety with mobility;Balance;Proper use of DME;Strengthening/ROM OT goals addressed during session: ADL's and self-care      AM-PAC OT "6 Clicks" Daily Activity     Outcome Measure Help from another person eating meals?: None Help from another person taking care of personal grooming?: A Little Help from  another person toileting, which includes using toliet, bedpan, or urinal?: A Little Help from another person bathing (including washing, rinsing, drying)?: A Little Help from another person to put on and taking off regular upper body clothing?: A Little Help from another person to put on and taking off regular lower body clothing?: A Little 6 Click Score: 19   End of Session Equipment Utilized During Treatment: Gait belt;Rolling walker (2 wheels) Nurse Communication: Mobility status  Activity Tolerance: Patient tolerated treatment well Patient left: in bed;with call bell/phone within reach;with bed alarm set  OT Visit Diagnosis: Other (comment) (SOB)                Time: 1610-9604 OT Time Calculation (min): 28 min Charges:  OT General Charges $OT Visit: 1 Visit OT Evaluation $OT Eval Low Complexity: 1 Low  11/23/2023  AB, OTR/L  Acute Rehabilitation Services  Office: [phone]   Tristan Schroeder 11/23/2023, 3:57 PM

## 2023-11-23 NOTE — Evaluation (Signed)
 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   Stroke [facility])    Past Surgical History:  Past Surgical History:  Procedure Laterality Date   ABDOMINAL AORTOGRAM W/LOWER EXTREMITY N/A 06/20/2023   Procedure: ABDOMINAL AORTOGRAM W/LOWER EXTREMITY;  Surgeon: Cephus Shelling, MD;  Location: [facility] INVASIVE CV LAB;  Service: Cardiovascular;  Laterality: N/A;   AMPUTATION Left 02/04/2021   Procedure: LEFT GREAT TOE AMPUTATION;  Surgeon: [name], MD;  Location: [facility] OR;  Service: Orthopedics;  Laterality: Left;   AMPUTATION Left 02/10/2021   Procedure: LEFT FOOT 1ST RAY AMPUTATION;  Surgeon: [name], MD;  Location: [facility] OR;  Service: Orthopedics;  Laterality: Left;   AMPUTATION Left 03/22/2021   Procedure: LEFT BELOW KNEE AMPUTATION;  Surgeon: [name], MD;  Location: [facility] OR;  Service: Orthopedics;  Laterality: Left;   APPENDECTOMY     BACK SURGERY     I & D EXTREMITY Left 03/03/2021   Procedure: LEFT FOOT DEBRIDEMENT;  Surgeon: [name], MD;  Location: [facility] OR;  Service: Orthopedics;  Laterality: Left;   INCISION AND DRAINAGE ABSCESS N/A 10/06/2021   Procedure: INCISION AND DRAINAGE BACK ABSCESS;  Surgeon: Berna Bue, MD;  Location: [facility] OR;  Service: General;  Laterality: N/A;   INJECTION OF SILICONE OIL Left 07/25/2023   Procedure: INJECTION OF SILICONE OIL;  Surgeon: Rennis Chris, MD;  Location: [facility] OR;  Service:  Ophthalmology;  Laterality: Left;   MEMBRANE PEEL Left 07/25/2023   Procedure: MEMBRANE PEEL;  Surgeon: Rennis Chris, MD;  Location: [facility] OR;  Service: Ophthalmology;  Laterality: Left;   PARS PLANA VITRECTOMY Left 07/25/2023   Procedure: PARS PLANA VITRECTOMY WITH 25 GAUGE;  Surgeon: Rennis Chris, MD;  Location: [facility] OR;  Service: Ophthalmology;  Laterality: Left;   removal of back cyst     TONSILLECTOMY     HPI:  Brett White is a 63 yo male presenting to ED 2/19 with SOB and five day history of reduced appetite. CTA Chest shows moderate to advanced multifocal nodular and consolidative airspace disease within all lobes of both lungs. Noted small amount of fluid present in the distal esophagus. PMH includes T2DM, stroke, L BKA    Assessment / Plan / Recommendation  Clinical Impression  Pt reports perceived difficulty swallowing and infrequent esophogeal reflux. Oral motor exam appears grossly functional, although note extensive missing dentition. Pt states this affects his ability to masticate and was initially on a Dys 2 diet at [facility] before upgrading to regular textures. Today, there was persistent throat clearing after trials of both solids and liquids. Given prior CVA and recurrent PNA, recommend completing MBS for further evaluation. Discussed with pt who is agreeable. Continue current diet pending results. SLP Visit Diagnosis: Dysphagia, unspecified (R13.10)    Aspiration Risk  Mild aspiration risk    Diet Recommendation Regular;Thin liquid    Liquid Administration via: Cup;Straw Medication Administration: Whole meds with liquid Supervision: Patient able  to self feed Compensations: Minimize environmental distractions;Slow rate;Small sips/bites Postural Changes: Seated upright at 90 degrees;Remain upright for at least 30 minutes after po intake    Other  Recommendations Oral Care Recommendations: Oral care BID    Recommendations for follow up therapy are one component of a  multi-disciplinary discharge planning process, led by the attending physician.  Recommendations may be updated based on patient status, additional functional criteria and insurance authorization.  Follow up Recommendations Skilled nursing-short term rehab (<3 hours/day)      Assistance Recommended at Discharge    Functional Status Assessment Patient has had a recent decline in their functional status and demonstrates the ability to make significant improvements in function in a reasonable and predictable amount of time.  Frequency and Duration min 2x/week  1 week       Prognosis Prognosis for improved oropharyngeal function: Good Barriers to Reach Goals: Cognitive deficits      Swallow Study   General HPI: Brett White is a 63 yo male presenting to ED 2/19 with SOB and five day history of reduced appetite. CTA Chest shows moderate to advanced multifocal nodular and consolidative airspace disease within all lobes of both lungs. Noted small amount of fluid present in the distal esophagus. PMH includes T2DM, stroke, L BKA Type of Study: Bedside Swallow Evaluation Previous Swallow Assessment: none in chart Diet Prior to this Study: Regular;Thin liquids (Level 0) Temperature Spikes Noted: No Respiratory Status: Nasal cannula History of Recent Intubation: No Behavior/Cognition: Alert;Cooperative;Pleasant mood Oral Cavity Assessment: Within Functional Limits Oral Care Completed by SLP: No Oral Cavity - Dentition: Poor condition;Missing dentition Vision: Functional for self-feeding Self-Feeding Abilities: Able to feed self Patient Positioning: Upright in chair Baseline Vocal Quality: Normal Volitional Cough: Strong Volitional Swallow: Able to elicit    Oral/Motor/Sensory Function Overall Oral Motor/Sensory Function: Within functional limits   Ice Chips Ice chips: Not tested   Thin Liquid Thin Liquid: Impaired Presentation: Straw;Self Fed Pharyngeal  Phase Impairments: Cough -  Delayed    Nectar Thick Nectar Thick Liquid: Not tested   Honey Thick Honey Thick Liquid: Not tested   Puree Puree: Not tested   Solid     Solid: Impaired Presentation: Self Fed Pharyngeal Phase Impairments: Throat Clearing - Delayed      Gwynneth Aliment, M.A., CF-SLP Speech Language Pathology, Acute Rehabilitation Services  Secure Chat preferred [phone]  11/23/2023,11:54 AM

## 2023-11-23 NOTE — Progress Notes (Signed)
 PROGRESS NOTE    Brett White  ION:[id number] DOB: 08-Dec-1960 DOA: 11/20/2023 PCP: [facility]    Brief Narrative:    Brett White (from SNF) is a 63 y.o. male with medical history significant for diabetes mellitus type 2, stroke, left BKA presents feeling terrible.  The patient tells me  he has had no appetite for the last for 5 days.  Yesterday he started having shortness of breath.  Shortness of breath is what brought him to the hospital.  He has had shaking chills and intermittent fevers starting yesterday.  The patient had pneumonia about a year ago but he says he feels much worse this time.  On arrival in the emergency department he was noted to have O2 sats in the 80s.  Does not normally wear oxygen and it required 4 L to get his sats above 90.  CT revealed bilateral multi lobar infiltrates.    Assessment and Plan:  Acute respiratory failure secondary to multilobar pneumonia -  - respiratory panel is negative -Concern for aspiration pneumonia as he reports cough with eating, antibiotics changed to IV Unasyn for better coverage, continue with erythromycin for atypicals  -He was encouraged to use incentive spirometry and flutter valve -check strep pna/legionella - Blood cultures pending, so far no growth to date -SLP consulted    New 4.1 cm AAA  -incidental finding   - vascular surgeon, Sherald Hess- has seen in past -Will need annual imaging    Type 2 diabetes mellitus  -SSI during hospital stay, will keep holding home [place] and Trulicity.   Recent left eye surgeries -Still reports pain in the eye, but no significant change, as significantly poor vision in the left eye at baseline. -I have reached out to Dr. Howell Pringle cussed with him, his recommendation is to continue with his Alphagan, eye lubricants, gatifloxacin and ketorolac, and to resume back on prednisolone eyedrops.  His assistance greatly appreciated, he provided his cell phone to be reached at  any time regarding any questions.   AKI -baseline unclear but range of (0.95-2.5 upon chart review)  Hyperipidemia -continue with Lipitor  Hypertension -Continue with home medications  Status post left BKA -PT and OT consulted, he is having prosthesis available in the room as well.  Obesity Estimated body mass index is 33.25 kg/m as calculated from the following:   Height as of 08/09/23: 6\' 3"  (1.905 m).   Weight as of this encounter: 120.7 kg.   DVT prophylaxis: Lovenox.    Code Status: Full Code   Disposition Plan:  Level of care: Telemetry Medical Status is: Inpatient Remains inpatient appropriate     Consultants:  None   Subjective: No significant events overnight as discussed with staff, patient reports coughing episodes while eating  Objective: Vitals:   11/23/23 0200 11/23/23 0400 11/23/23 0943 11/23/23 1006  BP:  125/81  124/74  Pulse:  87    Resp: 11 18    Temp:  97.9 F (36.6 C) 97.9 F (36.6 C)   TempSrc:  Oral Oral   SpO2:  91% 92%   Weight:        Intake/Output Summary (Last 24 hours) at 11/23/2023 1140 Last data filed at 11/23/2023 0900 Gross per 24 hour  Intake 240 ml  Output 2750 ml  Net -2510 ml   Filed Weights   11/22/23 1123  Weight: 120.7 kg    Examination:  Awake Alert, Oriented X 3, appears older than stated age Symmetrical Chest wall movement, scattered Rales  RRR,No Gallops,Rubs or new Murmurs, No Parasternal Heave +ve B.Sounds, Abd Soft, No tenderness, No rebound - guarding or rigidity. No Cyanosis, Clubbing or edema, left BKA   Data Reviewed: I have personally reviewed following labs and imaging studies  CBC: Recent Labs  Lab 11/20/23 1429 11/22/23 0753 11/23/23 0454  WBC 14.1* 12.6* 7.7  NEUTROABS 12.7*  --   --   HGB 11.2* 12.3* 9.9*  HCT 36.5* 40.2 31.6*  MCV 81.8 82.4 80.2  PLT 230 254 255   Basic Metabolic Panel: Recent Labs  Lab 11/20/23 1410 11/21/23 0522 11/22/23 0753 11/23/23 0454  NA 138 138  140 142  K 4.4 3.8 4.5 4.0  CL 104 103 103 99  CO2 24 26 26 28   GLUCOSE 294* 116* 114* 142*  BUN 30* 25* 20 16  CREATININE 1.84* 1.51* 1.46* 1.19  CALCIUM 8.7* 8.5* 9.0 9.4  MG  --  2.0  --   --    GFR: Estimated Creatinine Clearance: 92.4 mL/min (by C-G formula based on SCr of 1.19 mg/dL). Liver Function Tests: Recent Labs  Lab 11/20/23 1410 11/21/23 0522  AST 44* 27  ALT 37 30  ALKPHOS 91 85  BILITOT 0.6 0.7  PROT 6.4* 5.8*  ALBUMIN 3.3* 2.8*   No results for input(s): "LIPASE", "AMYLASE" in the last 168 hours. No results for input(s): "AMMONIA" in the last 168 hours. Coagulation Profile: No results for input(s): "INR", "PROTIME" in the last 168 hours. Cardiac Enzymes: No results for input(s): "CKTOTAL", "CKMB", "CKMBINDEX", "TROPONINI" in the last 168 hours. BNP (last 3 results) No results for input(s): "PROBNP" in the last 8760 hours. HbA1C: Recent Labs    11/21/23 1659  HGBA1C 8.1*   CBG: Recent Labs  Lab 11/21/23 2123 11/22/23 0752 11/22/23 1124 11/22/23 2137 11/23/23 0939  GLUCAP 135* 102* 129* 135* 145*   Lipid Profile: No results for input(s): "CHOL", "HDL", "LDLCALC", "TRIG", "CHOLHDL", "LDLDIRECT" in the last 72 hours. Thyroid Function Tests: No results for input(s): "TSH", "T4TOTAL", "FREET4", "T3FREE", "THYROIDAB" in the last 72 hours. Anemia Panel: No results for input(s): "VITAMINB12", "FOLATE", "FERRITIN", "TIBC", "IRON", "RETICCTPCT" in the last 72 hours. Sepsis Labs: Recent Labs  Lab 11/20/23 1446 11/20/23 2247  LATICACIDVEN 2.0* 1.2    Recent Results (from the past 240 hours)  Resp panel by RT-PCR (RSV, Flu A&B, Covid) Anterior Nasal Swab     Status: None   Collection Time: 11/20/23  2:00 PM   Specimen: Anterior Nasal Swab  Result Value Ref Range Status   SARS Coronavirus 2 by RT PCR NEGATIVE NEGATIVE Final   Influenza A by PCR NEGATIVE NEGATIVE Final   Influenza B by PCR NEGATIVE NEGATIVE Final    Comment: (NOTE) The Xpert  Xpress SARS-CoV-2/FLU/RSV plus assay is intended as an aid in the diagnosis of influenza from Nasopharyngeal swab specimens and should not be used as a sole basis for treatment. Nasal washings and aspirates are unacceptable for Xpert Xpress SARS-CoV-2/FLU/RSV testing.  Fact Sheet for Patients: BloggerCourse.com  Fact Sheet for Healthcare Providers: SeriousBroker.it  This test is not yet approved or cleared by the [place] FDA and has been authorized for detection and/or diagnosis of SARS-CoV-2 by FDA under an Emergency Use Authorization (EUA). This EUA will remain in effect (meaning this test can be used) for the duration of the COVID-19 declaration under Section 564(b)(1) of the Act, 21 U.S.C. section 360bbb-3(b)(1), unless the authorization is terminated or revoked.     Resp Syncytial Virus by PCR NEGATIVE NEGATIVE  Final    Comment: (NOTE) Fact Sheet for Patients: BloggerCourse.com  Fact Sheet for Healthcare Providers: SeriousBroker.it  This test is not yet approved or cleared by the [place] FDA and has been authorized for detection and/or diagnosis of SARS-CoV-2 by FDA under an Emergency Use Authorization (EUA). This EUA will remain in effect (meaning this test can be used) for the duration of the COVID-19 declaration under Section 564(b)(1) of the Act, 21 U.S.C. section 360bbb-3(b)(1), unless the authorization is terminated or revoked.  Performed at [facility], [address] [place], [place] [address]   Respiratory (~20 pathogens) panel by PCR     Status: None   Collection Time: 11/20/23  2:00 PM   Specimen: Nasopharyngeal Swab; Respiratory  Result Value Ref Range Status   Adenovirus NOT DETECTED NOT DETECTED Final   Coronavirus 229E NOT DETECTED NOT DETECTED Final    Comment: (NOTE) The Coronavirus on the Respiratory Panel, DOES NOT test for the  novel  Coronavirus (2019 nCoV)    Coronavirus HKU1 NOT DETECTED NOT DETECTED Final   Coronavirus NL63 NOT DETECTED NOT DETECTED Final   Coronavirus OC43 NOT DETECTED NOT DETECTED Final   Metapneumovirus NOT DETECTED NOT DETECTED Final   Rhinovirus / Enterovirus NOT DETECTED NOT DETECTED Final   Influenza A NOT DETECTED NOT DETECTED Final   Influenza B NOT DETECTED NOT DETECTED Final   Parainfluenza Virus 1 NOT DETECTED NOT DETECTED Final   Parainfluenza Virus 2 NOT DETECTED NOT DETECTED Final   Parainfluenza Virus 3 NOT DETECTED NOT DETECTED Final   Parainfluenza Virus 4 NOT DETECTED NOT DETECTED Final   Respiratory Syncytial Virus NOT DETECTED NOT DETECTED Final   Bordetella pertussis NOT DETECTED NOT DETECTED Final   Bordetella Parapertussis NOT DETECTED NOT DETECTED Final   Chlamydophila pneumoniae NOT DETECTED NOT DETECTED Final   Mycoplasma pneumoniae NOT DETECTED NOT DETECTED Final    Comment: Performed at [facility], [address], [place], [place] [address]  Blood Culture (routine x 2)     Status: None (Preliminary result)   Collection Time: 11/20/23  2:05 PM   Specimen: BLOOD LEFT FOREARM  Result Value Ref Range Status   Specimen Description BLOOD LEFT FOREARM  Final   Special Requests   Final    BOTTLES DRAWN AEROBIC AND ANAEROBIC Blood Culture adequate volume   Culture   Final    NO GROWTH 3 DAYS Performed at [facility], [address], [place], [place] [address]    Report Status PENDING  Incomplete  Blood Culture (routine x 2)     Status: None (Preliminary result)   Collection Time: 11/20/23  2:10 PM   Specimen: BLOOD  Result Value Ref Range Status   Specimen Description BLOOD RIGHT ANTECUBITAL  Final   Special Requests AEROBIC BOTTLE ONLY Blood Culture adequate volume  Final   Culture   Final    NO GROWTH 3 DAYS Performed at [facility], [address] [place], [place] [address]    Report Status PENDING  Incomplete          Radiology Studies: No results found.       Scheduled Meds:  amLODipine  10 mg Oral q morning   atorvastatin  40 mg Oral QHS   bacitracin-polymyxin b  1 Application Right Eye QID   brimonidine  1 drop Left Eye BID   DULoxetine  60 mg Oral Daily   enoxaparin (LOVENOX) injection  60 mg Subcutaneous Q24H  famotidine  20 mg Oral Daily   furosemide  20 mg Oral q morning   gatifloxacin  1 drop Left Eye BID   glipiZIDE  10 mg Oral q morning   insulin aspart  0-15 Units Subcutaneous TID WC   insulin aspart  0-5 Units Subcutaneous QHS   ketorolac  1 drop Left Eye QID   linaclotide  145 mcg Oral Daily   losartan  50 mg Oral Daily   melatonin  10 mg Oral QHS   pantoprazole  40 mg Oral Daily   polyethylene glycol  17 g Oral Daily   polyvinyl alcohol  4 drop Left Eye QID   prednisoLONE acetate  1 drop Left Eye QID   pregabalin  100 mg Oral QID   sertraline  50 mg Oral Daily   traZODone  75 mg Oral QHS   Vitamin D (Ergocalciferol)  50,000 Units Oral Weekly   Continuous Infusions:  ampicillin-sulbactam (UNASYN) IV 3 g (11/23/23 0542)   azithromycin 500 mg (11/23/23 1021)     LOS: 3 days      Huey Bienenstock, MD [facility] Available via Epic secure chat 7am-7pm After these hours, please refer to coverage provider listed on [email or web address] 11/23/2023, 11:40 AM

## 2023-11-24 DIAGNOSIS — Z89512 Acquired absence of left leg below knee: Secondary | ICD-10-CM | POA: Diagnosis not present

## 2023-11-24 DIAGNOSIS — J189 Pneumonia, unspecified organism: Secondary | ICD-10-CM | POA: Diagnosis not present

## 2023-11-24 LAB — CBC
HCT: 33.8 % — ABNORMAL LOW (ref 39.0–52.0)
Hemoglobin: 10.5 g/dL — ABNORMAL LOW (ref 13.0–17.0)
MCH: 25.2 pg — ABNORMAL LOW (ref 26.0–34.0)
MCHC: 31.1 g/dL (ref 30.0–36.0)
MCV: 81.1 fL (ref 80.0–100.0)
Platelets: 257 10*3/uL (ref 150–400)
RBC: 4.17 MIL/uL — ABNORMAL LOW (ref 4.22–5.81)
RDW: 15.8 % — ABNORMAL HIGH (ref 11.5–15.5)
WBC: 6.6 10*3/uL (ref 4.0–10.5)
nRBC: 0 % (ref 0.0–0.2)

## 2023-11-24 LAB — BASIC METABOLIC PANEL
Anion gap: 13 (ref 5–15)
BUN: 18 mg/dL (ref 6–20)
CO2: 28 mmol/L (ref 22–32)
Calcium: 9.1 mg/dL (ref 8.9–10.3)
Chloride: 99 mmol/L (ref 98–111)
Creatinine, Ser: 1.13 mg/dL (ref 0.61–1.24)
GFR, Estimated: >60 mL/min (ref >60)
Glucose, Bld: 137 mg/dL — ABNORMAL HIGH (ref 70–99)
Potassium: 4.1 mmol/L (ref 3.5–5.1)
Sodium: 140 mmol/L (ref 135–145)

## 2023-11-24 LAB — GLUCOSE, CAPILLARY
Glucose-Capillary: 143 mg/dL — ABNORMAL HIGH (ref 70–99)
Glucose-Capillary: 172 mg/dL — ABNORMAL HIGH (ref 70–99)
Glucose-Capillary: 138 mg/dL — ABNORMAL HIGH (ref 70–99)
Glucose-Capillary: 116 mg/dL — ABNORMAL HIGH (ref 70–99)

## 2023-11-24 MED ORDER — PHENOL 1.4 % MT LIQD
1.0000 | OROMUCOSAL | Status: DC | PRN
  Administered 2023-11-25: 1 via OROMUCOSAL
  Filled 2023-11-24: qty 177

## 2023-11-24 MED ORDER — GUAIFENESIN ER 600 MG PO TB12
600.0000 mg | ORAL_TABLET | Freq: Two times a day (BID) | ORAL | Status: DC
  Administered 2023-11-24 – 2023-11-26 (×5): 600 mg via ORAL
  Filled 2023-11-24 (×5): qty 1

## 2023-11-24 MED ORDER — DICLOFENAC SODIUM 1 % EX GEL
2.0000 g | Freq: Four times a day (QID) | CUTANEOUS | Status: DC
  Administered 2023-11-24 – 2023-11-25 (×5): 2 g via TOPICAL
  Filled 2023-11-24: qty 100

## 2023-11-24 MED ORDER — IPRATROPIUM-ALBUTEROL 0.5-2.5 (3) MG/3ML IN SOLN
3.0000 mL | Freq: Four times a day (QID) | RESPIRATORY_TRACT | Status: DC
  Administered 2023-11-24 (×3): 3 mL via RESPIRATORY_TRACT
  Filled 2023-11-24 (×3): qty 3

## 2023-11-24 NOTE — Plan of Care (Signed)

## 2023-11-24 NOTE — Progress Notes (Signed)
 PROGRESS NOTE    Brett White  QMV:[id number] DOB: 01-08-1961 DOA: 11/20/2023 PCP: [facility]    Brief Narrative:    Brett White (from SNF) is a 63 y.o. male with medical history significant for diabetes mellitus type 2, stroke, left BKA presents feeling terrible.  The patient tells me  he has had no appetite for the last for 5 days.  Yesterday he started having shortness of breath.  Shortness of breath is what brought him to the hospital.  He has had shaking chills and intermittent fevers starting yesterday.  The patient had pneumonia about a year ago but he says he feels much worse this time.  On arrival in the emergency department he was noted to have O2 sats in the 80s.  Does not normally wear oxygen and it required 4 L to get his sats above 90.  CT revealed bilateral multi lobar infiltrates.    Assessment and Plan:  Acute respiratory failure secondary to multilobar pneumonia -  - respiratory panel is negative -Concern for aspiration pneumonia as he reports cough with eating, antibiotics changed to IV Unasyn for better coverage, continue with erythromycin for atypicals  -He was encouraged to use incentive spirometry and flutter valve -check strep pna/legionella - Blood cultures pending, so far no growth to date -SLP consulted -Patient remains significantly congested, encouraged use incentive spirometer, flutter valve, started on Mucinex    New 4.1 cm AAA  -incidental finding   - vascular surgeon, Sherald Hess- has seen in past -Will need annual imaging    Type 2 diabetes mellitus  -SSI during hospital stay, will keep holding home [place] and Trulicity.   Recent left eye surgeries -Still reports pain in the eye, but no significant change, as significantly poor vision in the left eye at baseline. -I have reached out to Dr. Howell Pringle cussed with him, his recommendation is to continue with his Alphagan, eye lubricants, gatifloxacin and ketorolac, and to  resume back on prednisolone eyedrops.  His assistance greatly appreciated, he provided his cell phone to be reached at any time regarding any questions.   AKI -baseline unclear but range of (0.95-2.5 upon chart review)  Nontypical chest pain -Related to cough, musculoskeletal, tenderness to palpation on chest, CTA negative for PE, troponins negative,  Hyperipidemia -continue with Lipitor  Hypertension -Continue with home medications  Status post left BKA -PT and OT consulted, he is having prosthesis available in the room as well.  Obesity Estimated body mass index is 30.62 kg/m as calculated from the following:   Height as of 08/09/23: 6\' 3"  (1.905 m).   Weight as of this encounter: 111.1 kg.   DVT prophylaxis: Lovenox.    Code Status: Full Code   Disposition Plan:  Level of care: Telemetry Medical Status is: Inpatient Remains inpatient appropriate     Consultants:  None   Subjective: Still reports cough, congestion, reports musculoskeletal chest pain related to cough, and sore throat   He was encouraged to Use incentive spirometer, flutter valve, started on Mucinex, and Voltaren gel, and throat spray  Objective: Vitals:   11/23/23 1929 11/24/23 0523 11/24/23 0726 11/24/23 0939  BP: (!) 147/82 (!) 156/97 (!) 141/94   Pulse:  78 75   Resp: 15 10 10    Temp: (!) 97.2 F (36.2 C) (!) 97.4 F (36.3 C) 97.6 F (36.4 C)   TempSrc: Oral Oral Oral   SpO2:  90% 93% 94%  Weight:  111.1 kg      Intake/Output Summary (  Last 24 hours) at 11/24/2023 1133 Last data filed at 11/24/2023 7829 Gross per 24 hour  Intake 480 ml  Output 1850 ml  Net -1370 ml   Filed Weights   11/22/23 1123 11/24/23 0523  Weight: 120.7 kg 111.1 kg    Examination:  Awake Alert, Oriented X 3, Symmetrical Chest wall movement, Good air movement bilaterally, scattered Rales RRR,No Gallops,Rubs or new Murmurs, No Parasternal Heave +ve B.Sounds, Abd Soft, No tenderness, No rebound - guarding  or rigidity. No Cyanosis, Clubbing or edema, left BKA   Data Reviewed: I have personally reviewed following labs and imaging studies  CBC: Recent Labs  Lab 11/20/23 1429 11/22/23 0753 11/23/23 0454 11/24/23 0520  WBC 14.1* 12.6* 7.7 6.6  NEUTROABS 12.7*  --   --   --   HGB 11.2* 12.3* 9.9* 10.5*  HCT 36.5* 40.2 31.6* 33.8*  MCV 81.8 82.4 80.2 81.1  PLT 230 254 255 257   Basic Metabolic Panel: Recent Labs  Lab 11/20/23 1410 11/21/23 0522 11/22/23 0753 11/23/23 0454 11/24/23 0520  NA 138 138 140 142 140  K 4.4 3.8 4.5 4.0 4.1  CL 104 103 103 99 99  CO2 24 26 26 28 28   GLUCOSE 294* 116* 114* 142* 137*  BUN 30* 25* 20 16 18   CREATININE 1.84* 1.51* 1.46* 1.19 1.13  CALCIUM 8.7* 8.5* 9.0 9.4 9.1  MG  --  2.0  --   --   --    GFR: Estimated Creatinine Clearance: 93.5 mL/min (by C-G formula based on SCr of 1.13 mg/dL). Liver Function Tests: Recent Labs  Lab 11/20/23 1410 11/21/23 0522  AST 44* 27  ALT 37 30  ALKPHOS 91 85  BILITOT 0.6 0.7  PROT 6.4* 5.8*  ALBUMIN 3.3* 2.8*   No results for input(s): "LIPASE", "AMYLASE" in the last 168 hours. No results for input(s): "AMMONIA" in the last 168 hours. Coagulation Profile: No results for input(s): "INR", "PROTIME" in the last 168 hours. Cardiac Enzymes: No results for input(s): "CKTOTAL", "CKMB", "CKMBINDEX", "TROPONINI" in the last 168 hours. BNP (last 3 results) No results for input(s): "PROBNP" in the last 8760 hours. HbA1C: Recent Labs    11/21/23 1659  HGBA1C 8.1*   CBG: Recent Labs  Lab 11/23/23 0939 11/23/23 1211 11/23/23 1615 11/23/23 2006 11/24/23 0725  GLUCAP 145* 179* 141* 165* 143*   Lipid Profile: No results for input(s): "CHOL", "HDL", "LDLCALC", "TRIG", "CHOLHDL", "LDLDIRECT" in the last 72 hours. Thyroid Function Tests: No results for input(s): "TSH", "T4TOTAL", "FREET4", "T3FREE", "THYROIDAB" in the last 72 hours. Anemia Panel: No results for input(s): "VITAMINB12", "FOLATE",  "FERRITIN", "TIBC", "IRON", "RETICCTPCT" in the last 72 hours. Sepsis Labs: Recent Labs  Lab 11/20/23 1446 11/20/23 2247  LATICACIDVEN 2.0* 1.2    Recent Results (from the past 240 hours)  Resp panel by RT-PCR (RSV, Flu A&B, Covid) Anterior Nasal Swab     Status: None   Collection Time: 11/20/23  2:00 PM   Specimen: Anterior Nasal Swab  Result Value Ref Range Status   SARS Coronavirus 2 by RT PCR NEGATIVE NEGATIVE Final   Influenza A by PCR NEGATIVE NEGATIVE Final   Influenza B by PCR NEGATIVE NEGATIVE Final    Comment: (NOTE) The Xpert Xpress SARS-CoV-2/FLU/RSV plus assay is intended as an aid in the diagnosis of influenza from Nasopharyngeal swab specimens and should not be used as a sole basis for treatment. Nasal washings and aspirates are unacceptable for Xpert Xpress SARS-CoV-2/FLU/RSV testing.  Fact Sheet for Patients:  BloggerCourse.com  Fact Sheet for Healthcare Providers: SeriousBroker.it  This test is not yet approved or cleared by the [place] FDA and has been authorized for detection and/or diagnosis of SARS-CoV-2 by FDA under an Emergency Use Authorization (EUA). This EUA will remain in effect (meaning this test can be used) for the duration of the COVID-19 declaration under Section 564(b)(1) of the Act, 21 U.S.C. section 360bbb-3(b)(1), unless the authorization is terminated or revoked.     Resp Syncytial Virus by PCR NEGATIVE NEGATIVE Final    Comment: (NOTE) Fact Sheet for Patients: BloggerCourse.com  Fact Sheet for Healthcare Providers: SeriousBroker.it  This test is not yet approved or cleared by the [place] FDA and has been authorized for detection and/or diagnosis of SARS-CoV-2 by FDA under an Emergency Use Authorization (EUA). This EUA will remain in effect (meaning this test can be used) for the duration of the COVID-19 declaration under  Section 564(b)(1) of the Act, 21 U.S.C. section 360bbb-3(b)(1), unless the authorization is terminated or revoked.  Performed at [facility], [address], [place], [place] [address]   Respiratory (~20 pathogens) panel by PCR     Status: None   Collection Time: 11/20/23  2:00 PM   Specimen: Nasopharyngeal Swab; Respiratory  Result Value Ref Range Status   Adenovirus NOT DETECTED NOT DETECTED Final   Coronavirus 229E NOT DETECTED NOT DETECTED Final    Comment: (NOTE) The Coronavirus on the Respiratory Panel, DOES NOT test for the novel  Coronavirus (2019 nCoV)    Coronavirus HKU1 NOT DETECTED NOT DETECTED Final   Coronavirus NL63 NOT DETECTED NOT DETECTED Final   Coronavirus OC43 NOT DETECTED NOT DETECTED Final   Metapneumovirus NOT DETECTED NOT DETECTED Final   Rhinovirus / Enterovirus NOT DETECTED NOT DETECTED Final   Influenza A NOT DETECTED NOT DETECTED Final   Influenza B NOT DETECTED NOT DETECTED Final   Parainfluenza Virus 1 NOT DETECTED NOT DETECTED Final   Parainfluenza Virus 2 NOT DETECTED NOT DETECTED Final   Parainfluenza Virus 3 NOT DETECTED NOT DETECTED Final   Parainfluenza Virus 4 NOT DETECTED NOT DETECTED Final   Respiratory Syncytial Virus NOT DETECTED NOT DETECTED Final   Bordetella pertussis NOT DETECTED NOT DETECTED Final   Bordetella Parapertussis NOT DETECTED NOT DETECTED Final   Chlamydophila pneumoniae NOT DETECTED NOT DETECTED Final   Mycoplasma pneumoniae NOT DETECTED NOT DETECTED Final    Comment: Performed at [facility], [address] [place], [place] [address]  Blood Culture (routine x 2)     Status: None (Preliminary result)   Collection Time: 11/20/23  2:05 PM   Specimen: BLOOD LEFT FOREARM  Result Value Ref Range Status   Specimen Description BLOOD LEFT FOREARM  Final   Special Requests   Final    BOTTLES DRAWN AEROBIC AND ANAEROBIC Blood Culture adequate volume   Culture   Final    NO GROWTH 4 DAYS Performed at [facility], [address], [place], [place] [address]    Report Status PENDING  Incomplete  Blood Culture (routine x 2)     Status: None (Preliminary result)   Collection Time: 11/20/23  2:10 PM   Specimen: BLOOD  Result Value Ref Range Status   Specimen Description BLOOD RIGHT ANTECUBITAL  Final   Special Requests AEROBIC BOTTLE ONLY Blood Culture adequate volume  Final   Culture   Final    NO GROWTH 4 DAYS Performed at [facility], [address], [place], [place] [address]    Report Status PENDING  Incomplete         Radiology Studies: No results found.       Scheduled Meds:  amLODipine  10 mg Oral q morning   atorvastatin  40 mg Oral QHS   bacitracin-polymyxin b  1 Application Right Eye QID   brimonidine  1 drop Left Eye BID   diclofenac Sodium  2 g Topical QID   DULoxetine  60 mg Oral Daily   enoxaparin (LOVENOX) injection  60 mg Subcutaneous Q24H   famotidine  20 mg Oral Daily   furosemide  20 mg Oral q morning   gatifloxacin  1 drop Left Eye BID   glipiZIDE  10 mg Oral q morning   guaiFENesin  600 mg Oral BID   insulin aspart  0-15 Units Subcutaneous TID WC   insulin aspart  0-5 Units Subcutaneous QHS   ipratropium-albuterol  3 mL Nebulization QID   ketorolac  1 drop Left Eye QID   linaclotide  145 mcg Oral Daily   losartan  50 mg Oral Daily   melatonin  10 mg Oral QHS   pantoprazole  40 mg Oral Daily   polyethylene glycol  17 g Oral Daily   polyvinyl alcohol  4 drop Left Eye QID   prednisoLONE acetate  1 drop Left Eye QID   pregabalin  100 mg Oral QID   sertraline  50 mg Oral Daily   traZODone  75 mg Oral QHS   Vitamin D (Ergocalciferol)  50,000 Units Oral Weekly   Continuous Infusions:  ampicillin-sulbactam (UNASYN) IV 3 g (11/24/23 0536)   azithromycin 500 mg (11/24/23 0921)     LOS: 4 days      Huey Bienenstock, MD [facility] Available via Epic secure chat 7am-7pm After these hours, please refer to coverage provider listed  on [email or web address] 11/24/2023, 11:33 AM

## 2023-11-25 ENCOUNTER — Inpatient Hospital Stay (HOSPITAL_COMMUNITY): Payer: Medicare Other

## 2023-11-25 DIAGNOSIS — J9601 Acute respiratory failure with hypoxia: Secondary | ICD-10-CM | POA: Diagnosis not present

## 2023-11-25 DIAGNOSIS — J189 Pneumonia, unspecified organism: Secondary | ICD-10-CM | POA: Diagnosis not present

## 2023-11-25 LAB — GLUCOSE, CAPILLARY
Glucose-Capillary: 149 mg/dL — ABNORMAL HIGH (ref 70–99)
Glucose-Capillary: 201 mg/dL — ABNORMAL HIGH (ref 70–99)
Glucose-Capillary: 151 mg/dL — ABNORMAL HIGH (ref 70–99)
Glucose-Capillary: 145 mg/dL — ABNORMAL HIGH (ref 70–99)

## 2023-11-25 LAB — CULTURE, BLOOD (ROUTINE X 2)
Culture: NO GROWTH
Culture: NO GROWTH
Special Requests: ADEQUATE
Special Requests: ADEQUATE

## 2023-11-25 MED ORDER — IPRATROPIUM-ALBUTEROL 0.5-2.5 (3) MG/3ML IN SOLN
3.0000 mL | Freq: Three times a day (TID) | RESPIRATORY_TRACT | Status: DC
  Administered 2023-11-25: 3 mL via RESPIRATORY_TRACT
  Filled 2023-11-25 (×2): qty 3

## 2023-11-25 MED ORDER — ENOXAPARIN SODIUM 60 MG/0.6ML IJ SOSY
55.0000 mg | PREFILLED_SYRINGE | INTRAMUSCULAR | Status: DC
  Administered 2023-11-25: 55 mg via SUBCUTANEOUS
  Filled 2023-11-25: qty 0.6

## 2023-11-25 MED ORDER — IPRATROPIUM-ALBUTEROL 0.5-2.5 (3) MG/3ML IN SOLN
3.0000 mL | RESPIRATORY_TRACT | Status: DC | PRN
Start: 2023-11-25 — End: 2023-11-26

## 2023-11-25 NOTE — Evaluation (Signed)
 Modified Barium Swallow Study  Patient Details  Name: Brett White MRN: [id number] Date of Birth: [birth year]  Today's Date: 11/25/2023  Modified Barium Swallow completed.  Full report located under Chart Review in the Imaging Section.  History of Present Illness Brett White is a 63 yo male presenting to ED 2/19 with SOB and five day history of reduced appetite. CTA Chest shows moderate to advanced multifocal nodular and consolidative airspace disease within all lobes of both lungs. Noted small amount of fluid present in the distal esophagus. PMH includes T2DM, stroke, L BKA   Clinical Impression Pt's oropharyngeal swallow is WFL. He had a little hesitancy in swallowing the applesauce, spitting it out and reporting that he has a texture aversion, and then demonstrating functional swallowing when given a spoonful of just the barium pudding. Throat clearing and occasional coughing were observed throughout testing but pt had no penetration or aspiration with any consistency. Although he did demonstrate adequate mastication during MBS, note that pt says he does not always chew his food completely. Education was provided about the importance of mastication and encouraged him to take small enough bites and/or order soft enough foods that he feels like he can chew his food well before swallowing. Otherwise, he seems appropriate to remain on regular solids and thin liquids from an oropharyngeal standpoint. If there is still concern for an aspiration related PNA, could consider a mroe dedicated assessment of the esophagus in light of pt-reported symptoms, although note that the esophagus appeared to be clear upon completion of MBS.  DIGEST Swallow Severity Rating*  Safety: 0  Efficiency: 0  Overall Pharyngeal Swallow Severity: 0 1: mild; 2: moderate; 3: severe; 4: profound  *The Dynamic Imaging Grade of Swallowing Toxicity is standardized for the head and neck cancer population, however, demonstrates  promising clinical applications across populations to standardize the clinical rating of pharyngeal swallow safety and severity.  Factors that may increase risk of adverse event in presence of aspiration Brett White & Brett White 2021): Respiratory or GI disease  Swallow Evaluation Recommendations Recommendations: PO diet PO Diet Recommendation: Regular;Thin liquids (Level 0) Liquid Administration via: Cup;Straw Medication Administration: Whole meds with liquid Supervision: Patient able to self-feed Swallowing strategies  : Small bites/sips;Follow solids with liquids Postural changes: Position pt fully upright for meals;Stay upright 30-60 min after meals Oral care recommendations: Oral care BID (2x/day)      Brett White., M.A. CCC-SLP Acute Rehabilitation Services Office [phone]  Secure chat preferred  11/25/2023,12:02 PM

## 2023-11-25 NOTE — Progress Notes (Signed)
 Pharmacy Antibiotic Note  Brett White is a 63 y.o. male with PNA.   Pharmacy has been consulted for Unasyn dosing for aspiration pneumonia coverage.  He is afebrile, WBC down to normal, and renal function is stable.   Plan: Unasyn 3 gm IV q6h - EOT 2/26 am Pharmacy signing off but will continue to follow peripherally - please re-consult if needed  Weight: 111.1 kg (245 lb)  Temp (24hrs), Avg:97.9 F (36.6 C), Min:97.4 F (36.3 C), Max:98.2 F (36.8 C)  Recent Labs  Lab 11/20/23 1410 11/20/23 1429 11/20/23 1446 11/20/23 2247 11/21/23 0522 11/22/23 0753 11/23/23 0454 11/24/23 0520  WBC  --  14.1*  --   --   --  12.6* 7.7 6.6  CREATININE 1.84*  --   --   --  1.51* 1.46* 1.19 1.13  LATICACIDVEN  --   --  2.0* 1.2  --   --   --   --     Estimated Creatinine Clearance: 93.5 mL/min (by C-G formula based on SCr of 1.13 mg/dL).    No Known Allergies  Antimicrobials this admission: CTX 2/19>> 2/21 Cefepime 2/19>>2/20  Unasyn 2/21>> (2/26) Azithromycin 2/19 >> 2/23  Dose adjustments this admission: n/a  Microbiology results: 2/19 blood: neg 2/19 resp panel: neg 2/19 COVID, flu and RSV: neg  Thank you for involving pharmacy in this patient's care.  Loura Back, PharmD, BCPS Clinical Pharmacist Clinical phone for 11/25/2023 is [phone] 11/25/2023 11:21 AM

## 2023-11-25 NOTE — Plan of Care (Signed)
   Problem: Education: Goal: Ability to describe self-care measures that may prevent or decrease complications (Diabetes Survival Skills Education) will improve Outcome: Progressing

## 2023-11-25 NOTE — Progress Notes (Signed)
 PROGRESS NOTE    Brett White  UJW:[id number] DOB: 05/25/1961 DOA: 11/20/2023 PCP: [facility]    Brief Narrative:    Brett White (from SNF) is a 63 y.o. male with medical history significant for diabetes mellitus type 2, stroke, left BKA presents feeling terrible.  The patient tells me  he has had no appetite for the last for 5 days.  Yesterday he started having shortness of breath.  Shortness of breath is what brought him to the hospital.  He has had shaking chills and intermittent fevers starting yesterday.  The patient had pneumonia about a year ago but he says he feels much worse this time.  On arrival in the emergency department he was noted to have O2 sats in the 80s.  Does not normally wear oxygen and it required 4 L to get his sats above 90.  CT revealed bilateral multi lobar infiltrates.    Assessment and Plan:  Acute respiratory failure secondary to multilobar pneumonia -  - respiratory panel is negative -Concern for aspiration pneumonia as he reports cough with eating, antibiotics changed to IV Unasyn for better coverage, continue with erythromycin for atypicals  -He was encouraged to use incentive spirometry and flutter valve - Blood cultures pending, so far no growth to date -SLP consulted, as concern for aspiration pneumonia given he is coughing every time he eats, plan for modified barium swallow today -Patient remains significantly congested, encouraged use incentive spirometer, flutter valve, started on Mucinex    New 4.1 cm AAA  -incidental finding   - vascular surgeon, Sherald Hess- has seen in past -Will need annual imaging    Type 2 diabetes mellitus  -SSI during hospital stay, will keep holding home [place] and Trulicity.   Recent left eye surgeries -Still reports pain in the eye, but no significant change, as significantly poor vision in the left eye at baseline. -I have reached out to Dr. Howell Pringle cussed with him, his recommendation  is to continue with his Alphagan, eye lubricants, gatifloxacin and ketorolac, and to resume back on prednisolone eyedrops.  His assistance greatly appreciated, he provided his cell phone to be reached at any time regarding any questions.   AKI -baseline unclear but range of (0.95-2.5 upon chart review)  Nontypical chest pain -Related to cough, musculoskeletal, tenderness to palpation on chest, CTA negative for PE, troponins negative,  Hyperipidemia -continue with Lipitor  Hypertension -Continue with home medications  Status post left BKA -PT and OT consulted, he is having prosthesis available in the room as well.  Obesity Estimated body mass index is 30.62 kg/m as calculated from the following:   Height as of 08/09/23: 6\' 3"  (1.905 m).   Weight as of this encounter: 111.1 kg.   DVT prophylaxis: Lovenox.    Code Status: Full Code   Disposition Plan:  Level of care: Telemetry Medical Status is: Inpatient Remains inpatient appropriate     Consultants:  None   Subjective:  Reports cough, congestion, and nontypical chest pain related to cough, or it is still tender to palpation, reported has some improvement with him using incentive spirometer, flutter valve and Voltaren gel  Objective: Vitals:   11/24/23 2005 11/25/23 0005 11/25/23 0400 11/25/23 0803  BP:  (!) 144/94  (!) 136/93  Pulse:  87    Resp:  16    Temp:  98.1 F (36.7 C) 97.9 F (36.6 C) 98.2 F (36.8 C)  TempSrc:  Oral Oral Oral  SpO2: 94% (!) 87% 92% 92%  Weight:        Intake/Output Summary (Last 24 hours) at 11/25/2023 1019 Last data filed at 11/25/2023 0942 Gross per 24 hour  Intake 1300 ml  Output 1100 ml  Net 200 ml   Filed Weights   11/22/23 1123 11/24/23 0523  Weight: 120.7 kg 111.1 kg    Examination:  Awake Alert, Oriented X 3, Symmetrical Chest wall movement, scattered Rales at the bases, but good air entry RRR,No Gallops,Rubs or new Murmurs, No Parasternal Heave +ve B.Sounds, Abd  Soft, No tenderness, No rebound - guarding or rigidity. No Cyanosis, Clubbing or edema, left BKA   Data Reviewed: I have personally reviewed following labs and imaging studies  CBC: Recent Labs  Lab 11/20/23 1429 11/22/23 0753 11/23/23 0454 11/24/23 0520  WBC 14.1* 12.6* 7.7 6.6  NEUTROABS 12.7*  --   --   --   HGB 11.2* 12.3* 9.9* 10.5*  HCT 36.5* 40.2 31.6* 33.8*  MCV 81.8 82.4 80.2 81.1  PLT 230 254 255 257   Basic Metabolic Panel: Recent Labs  Lab 11/20/23 1410 11/21/23 0522 11/22/23 0753 11/23/23 0454 11/24/23 0520  NA 138 138 140 142 140  K 4.4 3.8 4.5 4.0 4.1  CL 104 103 103 99 99  CO2 24 26 26 28 28   GLUCOSE 294* 116* 114* 142* 137*  BUN 30* 25* 20 16 18   CREATININE 1.84* 1.51* 1.46* 1.19 1.13  CALCIUM 8.7* 8.5* 9.0 9.4 9.1  MG  --  2.0  --   --   --    GFR: Estimated Creatinine Clearance: 93.5 mL/min (by C-G formula based on SCr of 1.13 mg/dL). Liver Function Tests: Recent Labs  Lab 11/20/23 1410 11/21/23 0522  AST 44* 27  ALT 37 30  ALKPHOS 91 85  BILITOT 0.6 0.7  PROT 6.4* 5.8*  ALBUMIN 3.3* 2.8*   No results for input(s): "LIPASE", "AMYLASE" in the last 168 hours. No results for input(s): "AMMONIA" in the last 168 hours. Coagulation Profile: No results for input(s): "INR", "PROTIME" in the last 168 hours. Cardiac Enzymes: No results for input(s): "CKTOTAL", "CKMB", "CKMBINDEX", "TROPONINI" in the last 168 hours. BNP (last 3 results) No results for input(s): "PROBNP" in the last 8760 hours. HbA1C: No results for input(s): "HGBA1C" in the last 72 hours.  CBG: Recent Labs  Lab 11/24/23 0725 11/24/23 1230 11/24/23 1643 11/24/23 1913 11/25/23 0803  GLUCAP 143* 172* 138* 116* 149*   Lipid Profile: No results for input(s): "CHOL", "HDL", "LDLCALC", "TRIG", "CHOLHDL", "LDLDIRECT" in the last 72 hours. Thyroid Function Tests: No results for input(s): "TSH", "T4TOTAL", "FREET4", "T3FREE", "THYROIDAB" in the last 72 hours. Anemia  Panel: No results for input(s): "VITAMINB12", "FOLATE", "FERRITIN", "TIBC", "IRON", "RETICCTPCT" in the last 72 hours. Sepsis Labs: Recent Labs  Lab 11/20/23 1446 11/20/23 2247  LATICACIDVEN 2.0* 1.2    Recent Results (from the past 240 hours)  Resp panel by RT-PCR (RSV, Flu A&B, Covid) Anterior Nasal Swab     Status: None   Collection Time: 11/20/23  2:00 PM   Specimen: Anterior Nasal Swab  Result Value Ref Range Status   SARS Coronavirus 2 by RT PCR NEGATIVE NEGATIVE Final   Influenza A by PCR NEGATIVE NEGATIVE Final   Influenza B by PCR NEGATIVE NEGATIVE Final    Comment: (NOTE) The Xpert Xpress SARS-CoV-2/FLU/RSV plus assay is intended as an aid in the diagnosis of influenza from Nasopharyngeal swab specimens and should not be used as a sole basis for treatment. Nasal washings and aspirates  are unacceptable for Xpert Xpress SARS-CoV-2/FLU/RSV testing.  Fact Sheet for Patients: BloggerCourse.com  Fact Sheet for Healthcare Providers: SeriousBroker.it  This test is not yet approved or cleared by the [place] FDA and has been authorized for detection and/or diagnosis of SARS-CoV-2 by FDA under an Emergency Use Authorization (EUA). This EUA will remain in effect (meaning this test can be used) for the duration of the COVID-19 declaration under Section 564(b)(1) of the Act, 21 U.S.C. section 360bbb-3(b)(1), unless the authorization is terminated or revoked.     Resp Syncytial Virus by PCR NEGATIVE NEGATIVE Final    Comment: (NOTE) Fact Sheet for Patients: BloggerCourse.com  Fact Sheet for Healthcare Providers: SeriousBroker.it  This test is not yet approved or cleared by the [place] FDA and has been authorized for detection and/or diagnosis of SARS-CoV-2 by FDA under an Emergency Use Authorization (EUA). This EUA will remain in effect (meaning this test can be  used) for the duration of the COVID-19 declaration under Section 564(b)(1) of the Act, 21 U.S.C. section 360bbb-3(b)(1), unless the authorization is terminated or revoked.  Performed at [facility], [address] [place], [place] [address]   Respiratory (~20 pathogens) panel by PCR     Status: None   Collection Time: 11/20/23  2:00 PM   Specimen: Nasopharyngeal Swab; Respiratory  Result Value Ref Range Status   Adenovirus NOT DETECTED NOT DETECTED Final   Coronavirus 229E NOT DETECTED NOT DETECTED Final    Comment: (NOTE) The Coronavirus on the Respiratory Panel, DOES NOT test for the novel  Coronavirus (2019 nCoV)    Coronavirus HKU1 NOT DETECTED NOT DETECTED Final   Coronavirus NL63 NOT DETECTED NOT DETECTED Final   Coronavirus OC43 NOT DETECTED NOT DETECTED Final   Metapneumovirus NOT DETECTED NOT DETECTED Final   Rhinovirus / Enterovirus NOT DETECTED NOT DETECTED Final   Influenza A NOT DETECTED NOT DETECTED Final   Influenza B NOT DETECTED NOT DETECTED Final   Parainfluenza Virus 1 NOT DETECTED NOT DETECTED Final   Parainfluenza Virus 2 NOT DETECTED NOT DETECTED Final   Parainfluenza Virus 3 NOT DETECTED NOT DETECTED Final   Parainfluenza Virus 4 NOT DETECTED NOT DETECTED Final   Respiratory Syncytial Virus NOT DETECTED NOT DETECTED Final   Bordetella pertussis NOT DETECTED NOT DETECTED Final   Bordetella Parapertussis NOT DETECTED NOT DETECTED Final   Chlamydophila pneumoniae NOT DETECTED NOT DETECTED Final   Mycoplasma pneumoniae NOT DETECTED NOT DETECTED Final    Comment: Performed at [facility], [address], [place], [place] [address]  Blood Culture (routine x 2)     Status: None   Collection Time: 11/20/23  2:05 PM   Specimen: BLOOD LEFT FOREARM  Result Value Ref Range Status   Specimen Description BLOOD LEFT FOREARM  Final   Special Requests   Final    BOTTLES DRAWN AEROBIC AND ANAEROBIC Blood Culture adequate volume   Culture   Final    NO  GROWTH 5 DAYS Performed at [facility], [address], [place], [place] [address]    Report Status 11/25/2023 FINAL  Final  Blood Culture (routine x 2)     Status: None   Collection Time: 11/20/23  2:10 PM   Specimen: BLOOD  Result Value Ref Range Status   Specimen Description BLOOD RIGHT ANTECUBITAL  Final   Special Requests AEROBIC BOTTLE ONLY Blood Culture adequate volume  Final   Culture   Final    NO GROWTH 5 DAYS  Performed at [facility], [address], [place], [place] [address]    Report Status 11/25/2023 FINAL  Final         Radiology Studies: No results found.       Scheduled Meds:  amLODipine  10 mg Oral q morning   atorvastatin  40 mg Oral QHS   bacitracin-polymyxin b  1 Application Right Eye QID   brimonidine  1 drop Left Eye BID   diclofenac Sodium  2 g Topical QID   DULoxetine  60 mg Oral Daily   enoxaparin (LOVENOX) injection  60 mg Subcutaneous Q24H   famotidine  20 mg Oral Daily   furosemide  20 mg Oral q morning   gatifloxacin  1 drop Left Eye BID   glipiZIDE  10 mg Oral q morning   guaiFENesin  600 mg Oral BID   insulin aspart  0-15 Units Subcutaneous TID WC   insulin aspart  0-5 Units Subcutaneous QHS   ipratropium-albuterol  3 mL Nebulization TID   ketorolac  1 drop Left Eye QID   linaclotide  145 mcg Oral Daily   losartan  50 mg Oral Daily   melatonin  10 mg Oral QHS   pantoprazole  40 mg Oral Daily   polyethylene glycol  17 g Oral Daily   polyvinyl alcohol  4 drop Left Eye QID   prednisoLONE acetate  1 drop Left Eye QID   pregabalin  100 mg Oral QID   sertraline  50 mg Oral Daily   traZODone  75 mg Oral QHS   Vitamin D (Ergocalciferol)  50,000 Units Oral Weekly   Continuous Infusions:  ampicillin-sulbactam (UNASYN) IV 3 g (11/25/23 0619)     LOS: 5 days      Huey Bienenstock, MD [facility] Available via Epic secure chat 7am-7pm After these hours, please refer to coverage provider listed on  [email or web address] 11/25/2023, 10:19 AM

## 2023-11-26 DIAGNOSIS — J9601 Acute respiratory failure with hypoxia: Secondary | ICD-10-CM | POA: Diagnosis not present

## 2023-11-26 DIAGNOSIS — J189 Pneumonia, unspecified organism: Secondary | ICD-10-CM | POA: Diagnosis not present

## 2023-11-26 LAB — GLUCOSE, CAPILLARY
Glucose-Capillary: 162 mg/dL — ABNORMAL HIGH (ref 70–99)
Glucose-Capillary: 140 mg/dL — ABNORMAL HIGH (ref 70–99)

## 2023-11-26 MED ORDER — OXYCODONE-ACETAMINOPHEN 10-325 MG PO TABS: 1.0000 | ORAL_TABLET | ORAL | 0 refills | Status: DC | PRN

## 2023-11-26 MED ORDER — PREDNISOLONE ACETATE 1 % OP SUSP: 1.0000 [drp] | Freq: Four times a day (QID) | OPHTHALMIC | Status: DC

## 2023-11-26 MED ORDER — IPRATROPIUM-ALBUTEROL 0.5-2.5 (3) MG/3ML IN SOLN: 3.0000 mL | RESPIRATORY_TRACT | Status: DC | PRN

## 2023-11-26 NOTE — Care Management Important Message (Signed)
 Important Message  Patient Details  Name: Brett White MRN: [id number] Date of Birth: 01-13-1961   Important Message Given:  Yes - Medicare IM     Dorena Bodo 11/26/2023, 9:19 AM

## 2023-11-26 NOTE — Discharge Instructions (Addendum)
 Follow with Primary MD Rehab, [facility] phyisican Get CBC, CMP, 2 view Chest X ray checked  by Primary MD next visit.    Activity: As tolerated with Full fall precautions use walker/cane & assistance as needed   Disposition SNF   Diet: Heart Healthy / carb modified    On your next visit with your primary care physician please Get Medicines reviewed and adjusted.   Please request your Prim.MD to go over all Hospital Tests and Procedure/Radiological results at the follow up, please get all Hospital records sent to your Prim MD by signing hospital release before you go home.   If you experience worsening of your admission symptoms, develop shortness of breath, life threatening emergency, suicidal or homicidal thoughts you must seek medical attention immediately by calling 911 or calling your MD immediately  if symptoms less severe.  You Must read complete instructions/literature along with all the possible adverse reactions/side effects for all the Medicines you take and that have been prescribed to you. Take any new Medicines after you have completely understood and accpet all the possible adverse reactions/side effects.   Do not drive, operating heavy machinery, perform activities at heights, swimming or participation in water activities or provide baby sitting services if your were admitted for syncope or siezures until you have seen by Primary MD or a Neurologist and advised to do so again.  Do not drive when taking Pain medications.    Do not take more than prescribed Pain, Sleep and Anxiety Medications  Special Instructions: If you have smoked or chewed Tobacco  in the last 2 yrs please stop smoking, stop any regular Alcohol  and or any Recreational drug use.  Wear Seat belts while driving.   Please note  You were cared for by a hospitalist during your hospital stay. If you have any questions about your discharge medications or the care you received while you  were in the hospital after you are discharged, you can call the unit and asked to speak with the hospitalist on call if the hospitalist that took care of you is not available. Once you are discharged, your primary care physician will handle any further medical issues. Please note that NO REFILLS for any discharge medications will be authorized once you are discharged, as it is imperative that you return to your primary care physician (or establish a relationship with a primary care physician if you do not have one) for your aftercare needs so that they can reassess your need for medications and monitor your lab values.

## 2023-11-26 NOTE — TOC Transition Note (Signed)
 Transition of Care [facility]) - Discharge Note   Patient Details  Name: Brett White MRN: [id number] Date of Birth: 1961/04/17  [name] CM/SW Contact:  Jessie Foot, RN Phone Number: 11/26/2023, 11:06 AM   Clinical Narrative:    Patient will DC to: [name] Anticipated DC date: 11/26/2023 Family notified: Patient Transport by: Sharin Mons   Per MD patient ready for DC to [name]. RN to call report prior to discharge [phone] Rm # [id number]). RN, patient, and facility notified of DC. Discharge Summary and FL2 sent to facility. DC packet on chart., includes 1 signed prescription.  Ambulance transport requested for patient.   Case Manager will sign off for now as CM intervention is no longer needed. Please consult [place] again if new needs arise.     Final next level of care: Skilled Nursing Facility Barriers to Discharge: Barriers Resolved   Patient Goals and CMS Choice Patient states their goals for this hospitalization and ongoing recovery are:: Return to SNF   Choice offered to / list presented to : Patient      Discharge Placement   Existing PASRR number confirmed : 11/26/23          Patient chooses bed at: [name] Patient to be transferred to facility by: [facility] Name of family member notified: Patient Patient and family notified of of transfer: 11/26/23  Discharge Plan and Services Additional resources added to the After Visit Summary for   In-house Referral: Clinical Social Work   Post Acute Care Choice: Skilled Nursing Facility          DME Arranged: N/A                    Social Drivers of Health (SDOH) Interventions SDOH Screenings   Food Insecurity: No Food Insecurity (11/21/2023)  Housing: Low Risk  (11/21/2023)  Transportation Needs: No Transportation Needs (11/21/2023)  Utilities: Not At Risk (11/21/2023)  Tobacco Use: Low Risk  (11/20/2023)     Readmission Risk  Interventions    11/16/2022    2:57 PM  Readmission Risk Prevention Plan  Transportation Screening Complete  Medication Review (RN Care Manager) Complete  PCP or Specialist appointment within 3-5 days of discharge Complete  HRI or Home Care Consult Complete  SW Recovery Care/Counseling Consult Complete  Palliative Care Screening Complete  Skilled Nursing Facility Complete

## 2023-11-26 NOTE — Discharge Summary (Signed)
 Physician Discharge Summary  Brett White ZOX:[id number] DOB: 1961-03-21 DOA: 11/20/2023  PCP: [facility]  Admit date: 11/20/2023 Discharge date: 11/26/2023  Admitted From: [facility]) Disposition:  [facility] SNF)  Recommendations for Outpatient Follow-up:  Please keep encouraging to use incentive spirometer, flutter valve, please keep on aspiration precaution, only feed when patient is sitting up. Patient will need annual imaging for diagnosis of AAA   Diet recommendation: Heart Healthy / Carb Modified   Brief/Interim Summary: Brett White (from SNF) is a 63 y.o. male with medical history significant for diabetes mellitus type 2, stroke, left BKA presents feeling terrible.  The patient tells me  he has had no appetite for the last for 5 days.  Yesterday he started having shortness of breath.  Shortness of breath is what brought him to the hospital.  He has had shaking chills and intermittent fevers starting yesterday.  The patient had pneumonia about a year ago but he says he feels much worse this time.  On arrival in the emergency department he was noted to have O2 sats in the 80s.  Does not normally wear oxygen and it required 4 L to get his sats above 90.  CT revealed bilateral multi lobar infiltrates.   cute respiratory failure secondary to multilobar pneumonia -  - respiratory panel is negative -Concern for aspiration pneumonia as he reports cough with eating, antibiotics changed to IV Unasyn for better coverage, as well he was treated with azithromycin for atypical, he finished all antibiotics prior to discharge, -He was encouraged to use incentive spirometry and flutter valve, encouraged to take them at facility and keep using them - Blood cultures with nno growth to date -SLP consulted, as concern for aspiration pneumonia given he is coughing every time he eats, patient went for modified barium swallow as well, recommendation to continue current consistency  diet with thin liquid.      New 4.1 cm AAA  -incidental finding   -Patient has been established with vascular surgery in the past for his known PAD, to follow-up as an outpatient  -Will need annual imaging    Type 2 diabetes mellitus  -Regimen.   Recent left eye surgeries -Still reports pain in the eye, but no significant change, as significantly poor vision in the left eye at baseline. -I have reached out to Dr. Howell Pringle cussed with him, his recommendation is to continue with his Alphagan, eye lubricants, gatifloxacin and ketorolac, and to resume back on prednisolone eyedrops.  His assistance greatly appreciated, he provided his cell phone to be reached at any time regarding any questions.   AKI -baseline unclear but range of (0.95-2.5 upon chart review)   Nontypical chest pain -Related to cough, musculoskeletal, tenderness to palpation on chest, CTA negative for PE, troponins negative,   Hyperipidemia -continue with Lipitor   Hypertension -Continue with home medications   Status post left BKA -PT and OT consulted, he is having prosthesis available in the room as well.   Obesity Estimated body mass index is 30.62 kg/m as calculated from the following:   Height as of 08/09/23: 6\' 3"  (1.905 m).   Weight as of this encounter: 111.1 kg  Discharge Diagnoses:  Principal Problem:   Acute respiratory failure with hypoxia (HCC) Active Problems:   Pneumonia   Uncontrolled type 2 diabetes mellitus with hyperglycemia (HCC)   Diabetic polyneuropathy associated with type 2 diabetes mellitus (HCC)   S/P BKA (below knee amputation) unilateral, left (HCC)   GERD without esophagitis  Discharge Instructions  Discharge Instructions     Diet - low sodium heart healthy   Complete by: As directed    Discharge instructions   Complete by: As directed    Follow with Primary MD Rehab, [facility] phyisican Get CBC, CMP, 2 view Chest X ray checked  by Primary MD next  visit.    Activity: As tolerated with Full fall precautions use walker/cane & assistance as needed   Disposition SNF   Diet: Heart Healthy / carb modified    On your next visit with your primary care physician please Get Medicines reviewed and adjusted.   Please request your Prim.MD to go over all Hospital Tests and Procedure/Radiological results at the follow up, please get all Hospital records sent to your Prim MD by signing hospital release before you go home.   If you experience worsening of your admission symptoms, develop shortness of breath, life threatening emergency, suicidal or homicidal thoughts you must seek medical attention immediately by calling 911 or calling your MD immediately  if symptoms less severe.  You Must read complete instructions/literature along with all the possible adverse reactions/side effects for all the Medicines you take and that have been prescribed to you. Take any new Medicines after you have completely understood and accpet all the possible adverse reactions/side effects.   Do not drive, operating heavy machinery, perform activities at heights, swimming or participation in water activities or provide baby sitting services if your were admitted for syncope or siezures until you have seen by Primary MD or a Neurologist and advised to do so again.  Do not drive when taking Pain medications.    Do not take more than prescribed Pain, Sleep and Anxiety Medications  Special Instructions: If you have smoked or chewed Tobacco  in the last 2 yrs please stop smoking, stop any regular Alcohol  and or any Recreational drug use.  Wear Seat belts while driving.   Please note  You were cared for by a hospitalist during your hospital stay. If you have any questions about your discharge medications or the care you received while you were in the hospital after you are discharged, you can call the unit and asked to speak with the hospitalist on call if the  hospitalist that took care of you is not available. Once you are discharged, your primary care physician will handle any further medical issues. Please note that NO REFILLS for any discharge medications will be authorized once you are discharged, as it is imperative that you return to your primary care physician (or establish a relationship with a primary care physician if you do not have one) for your aftercare needs so that they can reassess your need for medications and monitor your lab values.   Increase activity slowly   Complete by: As directed       Allergies as of 11/26/2023   No Known Allergies      Medication List     TAKE these medications    acetaminophen 325 MG tablet Commonly known as: TYLENOL Take 2 tablets (650 mg total) by mouth every 6 (six) hours as needed for mild pain (or Fever >/= 101).   amLODipine 10 MG tablet Commonly known as: NORVASC Take 10 mg by mouth every morning.   atorvastatin 40 MG tablet Commonly known as: LIPITOR Take 40 mg by mouth at bedtime.   bacitracin-polymyxin b ophthalmic ointment Commonly known as: POLYSPORIN Place into the right eye 4 (four) times daily.  Place a 1/2 inch ribbon of ointment into the lower eyelid 4 a day and as needed for discomfort   bisacodyl 5 MG EC tablet Commonly known as: DULCOLAX Take 10 mg by mouth daily as needed (CONSTIPATION).   brimonidine 0.2 % ophthalmic solution Commonly known as: ALPHAGAN Place 1 drop into the left eye in the morning and at bedtime.   carboxymethylcellulose 1 % ophthalmic solution Place 4 drops into the left eye in the morning, at noon, in the evening, and at bedtime.   cyclobenzaprine 5 MG tablet Commonly known as: FLEXERIL Take 1 tablet (5 mg total) by mouth 3 (three) times daily as needed for muscle spasms.   DULoxetine 60 MG capsule Commonly known as: CYMBALTA Take 60 mg by mouth daily.   ergocalciferol 1.25 MG (50000 UT) capsule Commonly known as: VITAMIN D2 Take  50,000 Units by mouth once a week. Fridays   famotidine 20 MG tablet Commonly known as: PEPCID Take 20 mg by mouth daily.   Farxiga 10 MG Tabs tablet Generic drug: dapagliflozin propanediol Take 10 mg by mouth every morning. Dapaglifozin   furosemide 20 MG tablet Commonly known as: LASIX Take 20 mg by mouth every morning.   glipiZIDE 10 MG tablet Commonly known as: GLUCOTROL Take 10 mg by mouth every morning.   guaiFENesin 100 MG/5ML liquid Commonly known as: ROBITUSSIN Take 10 mLs by mouth every 4 (four) hours as needed for cough or to loosen phlegm.   hydrocortisone cream 1 % Apply 1 Application topically daily. Apply to right lower leg for wound care   hydrOXYzine 10 MG tablet Commonly known as: ATARAX Take 1 tablet (10 mg total) by mouth 3 (three) times daily as needed for itching.   insulin lispro 100 UNIT/ML KwikPen Commonly known as: HUMALOG Inject 2-8 Units into the skin 3 (three) times daily with meals. Sliding scale 201-250=2 units 251-300=4 units 301-350=6 units 351-400= 8 units 401 or greater than=call [facility]   ipratropium-albuterol 0.5-2.5 (3) MG/3ML Soln Commonly known as: DUONEB Take 3 mLs by nebulization every 4 (four) hours as needed.   ketorolac 0.4 % Soln Commonly known as: ACULAR Place 1 drop into the left eye 4 (four) times daily.   Linzess 145 MCG Caps capsule Generic drug: linaclotide Take 145 mcg by mouth daily.   losartan 50 MG tablet Commonly known as: COZAAR Take 1 tablet (50 mg total) by mouth daily.   magnesium hydroxide 400 MG/5ML suspension Commonly known as: MILK OF MAGNESIA Take 30 mLs by mouth daily as needed for mild constipation.   Melatonin 5 MG Caps Take 10 mg by mouth at bedtime.   moxifloxacin 0.5 % ophthalmic solution Commonly known as: VIGAMOX Place 1 drop into the left eye in the morning and at bedtime. For 7 days   naloxone 4 MG/0.1ML Liqd nasal spray kit Commonly known as: NARCAN Place 1 spray into the nose  See admin instructions. Every 2-3 minutesas needed until patient response/ emba arrived.   oxyCODONE-acetaminophen 10-325 MG tablet Commonly known as: PERCOCET Take 1 tablet by mouth every 4 (four) hours as needed for pain. What changed: Another medication with the same name was removed. Continue taking this medication, and follow the directions you see here.   pantoprazole 40 MG tablet Commonly known as: PROTONIX Take 1 tablet (40 mg total) by mouth daily.   polyethylene glycol 17 g packet Commonly known as: MIRALAX / GLYCOLAX Take 17 g by mouth daily. What changed: additional instructions   Polyethylene Glycol 3350 Powd  Take 17 g by mouth every morning. Mix with 4 oz of fluids   prednisoLONE acetate 1 % ophthalmic suspension Commonly known as: PRED FORTE Place 1 drop into the left eye 4 (four) times daily.   pregabalin 100 MG capsule Commonly known as: LYRICA Take 1 capsule (100 mg total) by mouth 2 (two) times daily. What changed: when to take this   sertraline 50 MG tablet Commonly known as: ZOLOFT Take 50 mg by mouth daily.   SODIUM PHOSPHATES RE Place 1 application  rectally daily. As needed constipation 3 of 4   traZODone 150 MG tablet Commonly known as: DESYREL Take 75 mg by mouth at bedtime.   Trulicity 1.5 MG/0.5ML Soaj Generic drug: Dulaglutide Inject 0.5 mg into the skin once a week. Thursdays        No Known Allergies  Consultations: none   Procedures/Studies: DG Swallowing Func-Speech Pathology Result Date: 11/25/2023 Table formatting from the original result was not included. Modified Barium Swallow Study Patient Details Name: Brett White MRN: [id number] Date of Birth: 10/23/1960 Today's Date: 11/25/2023 HPI/PMH: HPI: Rylei Codispoti is a 63 yo male presenting to ED 2/19 with SOB and five day history of reduced appetite. CTA Chest shows moderate to advanced multifocal nodular and consolidative airspace disease within all lobes of both lungs. Noted  small amount of fluid present in the distal esophagus. PMH includes T2DM, stroke, L BKA Clinical Impression: Clinical Impression: Pt's oropharyngeal swallow is WFL. He had a little hesitancy in swallowing the applesauce, spitting it out and reporting that he has a texture aversion, and then demonstrating functional swallowing when given a spoonful of just the barium pudding. Throat clearing and occasional coughing were observed throughout testing but pt had no penetration or aspiration with any consistency. Although he did demonstrate adequate mastication during MBS, note that pt says he does not always chew his food completely. Education was provided about the importance of mastication and encouraged him to take small enough bites and/or order soft enough foods that he feels like he can chew his food well before swallowing. Otherwise, he seems appropriate to remain on regular solids and thin liquids from an oropharyngeal standpoint. If there is still concern for an aspiration related PNA, could consider a mroe dedicated assessment of the esophagus in light of pt-reported symptoms, although note that the esophagus appeared to be clear upon completion of MBS. DIGEST Swallow Severity Rating*  Safety: 0  Efficiency: 0  Overall Pharyngeal Swallow Severity: 0 1: mild; 2: moderate; 3: severe; 4: profound *The Dynamic Imaging Grade of Swallowing Toxicity is standardized for the head and neck cancer population, however, demonstrates promising clinical applications across populations to standardize the clinical rating of pharyngeal swallow safety and severity. Factors that may increase risk of adverse event in presence of aspiration [name] & [name] 2021): Factors that may increase risk of adverse event in presence of aspiration [name] & [name] 2021): Respiratory or GI disease Recommendations/Plan: Swallowing Evaluation Recommendations Swallowing Evaluation Recommendations Recommendations: PO diet PO Diet Recommendation:  Regular; Thin liquids (Level 0) Liquid Administration via: Cup; Straw Medication Administration: Whole meds with liquid Supervision: Patient able to self-feed Swallowing strategies  : Small bites/sips; Follow solids with liquids Postural changes: Position pt fully upright for meals; Stay upright 30-60 min after meals Oral care recommendations: Oral care BID (2x/day) Treatment Plan Treatment Plan Treatment recommendations: Defer treatment plan to SLP at other venue (see follow-up recommendations) Follow-up recommendations: Skilled nursing-short term rehab (<3 hours/day) Functional status assessment: Patient has not  had a recent decline in their functional status. Recommendations Recommendations for follow up therapy are one component of a multi-disciplinary discharge planning process, led by the attending physician.  Recommendations may be updated based on patient status, additional functional criteria and insurance authorization. Assessment: Orofacial Exam: Orofacial Exam Oral Cavity - Dentition: Poor condition; Missing dentition Anatomy: Anatomy: Suspected cervical osteophytes Boluses Administered: Boluses Administered Boluses Administered: Thin liquids (Level 0); Mildly thick liquids (Level 2, nectar thick); Moderately thick liquids (Level 3, honey thick); Puree; Solid  Oral Impairment Domain: Oral Impairment Domain Lip Closure: No labial escape Tongue control during bolus hold: Cohesive bolus between tongue to palatal seal Bolus preparation/mastication: Timely and efficient chewing and mashing Bolus transport/lingual motion: Brisk tongue motion Oral residue: Complete oral clearance Location of oral residue : N/A Initiation of pharyngeal swallow : Valleculae  Pharyngeal Impairment Domain: Pharyngeal Impairment Domain Soft palate elevation: No bolus between soft palate (SP)/pharyngeal wall (PW) Laryngeal elevation: Complete superior movement of thyroid cartilage with complete approximation of arytenoids to  epiglottic petiole Anterior hyoid excursion: Complete anterior movement Epiglottic movement: Complete inversion Laryngeal vestibule closure: Complete, no air/contrast in laryngeal vestibule Pharyngeal stripping wave : Present - complete Pharyngeal contraction (A/P view only): N/A Pharyngoesophageal segment opening: Complete distension and complete duration, no obstruction of flow Tongue base retraction: No contrast between tongue base and posterior pharyngeal wall (PPW) Pharyngeal residue: Complete pharyngeal clearance Location of pharyngeal residue: N/A  Esophageal Impairment Domain: Esophageal Impairment Domain Esophageal clearance upright position: Complete clearance, esophageal coating Pill: Pill Consistency administered: Thin liquids (Level 0) Thin liquids (Level 0): [facility] Penetration/Aspiration Scale Score: Penetration/Aspiration Scale Score 1.  Material does not enter airway: Thin liquids (Level 0); Mildly thick liquids (Level 2, nectar thick); Moderately thick liquids (Level 3, honey thick); Puree; Solid; Pill Compensatory Strategies: Compensatory Strategies Compensatory strategies: Yes Straw: Effective Effective Straw: Thin liquid (Level 0)   General Information: Caregiver present: No  Diet Prior to this Study: Regular; Thin liquids (Level 0)   Temperature : Normal   Respiratory Status: WFL   Supplemental O2: Nasal cannula   History of Recent Intubation: No  Behavior/Cognition: Alert; Cooperative; Pleasant mood Self-Feeding Abilities: Able to self-feed Baseline vocal quality/speech: Normal No data recorded Volitional Swallow: Able to elicit Exam Limitations: No limitations Goal Planning: Prognosis for improved oropharyngeal function: Good No data recorded No data recorded Patient/Family Stated Goal: none stated Consulted and agree with results and recommendations: Patient Pain: Pain Assessment Pain Assessment: Faces Faces Pain Scale: 0 End of Session: Start Time:SLP Start Time (ACUTE ONLY): 0846 Stop Time:  SLP Stop Time (ACUTE ONLY): 0900 Time Calculation:SLP Time Calculation (min) (ACUTE ONLY): 14 min Charges: SLP Evaluations $ SLP Speech Visit: 1 Visit SLP Evaluations $MBS Swallow: 1 Procedure SLP visit diagnosis: SLP Visit Diagnosis: Dysphagia, unspecified (R13.10) Past Medical History: Past Medical History: Diagnosis Date  CAP (community acquired pneumonia) 12/09/2021  CKD (chronic kidney disease)   GERD (gastroesophageal reflux disease)   HTN (hypertension)   Insulin dependent type 2 diabetes mellitus (HCC)   Neuropathy   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  Stroke [facility])  Past Surgical History: Past Surgical History: Procedure Laterality Date  ABDOMINAL AORTOGRAM W/LOWER EXTREMITY N/A 06/20/2023  Procedure: ABDOMINAL AORTOGRAM W/LOWER EXTREMITY;  Surgeon: Cephus Shelling, MD;  Location: [facility] INVASIVE CV LAB;  Service: Cardiovascular;  Laterality: N/A;  AMPUTATION Left 02/04/2021  Procedure: LEFT GREAT TOE AMPUTATION;  Surgeon: [name], MD;  Location: [facility] OR;  Service: Orthopedics;  Laterality: Left;  AMPUTATION Left 02/10/2021  Procedure: LEFT FOOT 1ST RAY AMPUTATION;  Surgeon: [name], MD;  Location: [facility] OR;  Service: Orthopedics;  Laterality: Left;  AMPUTATION Left 03/22/2021  Procedure: LEFT BELOW KNEE AMPUTATION;  Surgeon: [name], MD;  Location: [facility] OR;  Service: Orthopedics;  Laterality: Left;  APPENDECTOMY    BACK SURGERY    I & D EXTREMITY Left 03/03/2021  Procedure: LEFT FOOT DEBRIDEMENT;  Surgeon: [name], MD;  Location: [facility] OR;  Service: Orthopedics;  Laterality: Left;  INCISION AND DRAINAGE ABSCESS N/A 10/06/2021  Procedure: INCISION AND DRAINAGE BACK ABSCESS;  Surgeon: Berna Bue, MD;  Location: [facility] OR;  Service: General;  Laterality: N/A;  INJECTION OF SILICONE OIL Left 07/25/2023  Procedure: INJECTION OF SILICONE OIL;  Surgeon: Rennis Chris, MD;  Location: [facility] OR;  Service: Ophthalmology;  Laterality: Left;  MEMBRANE PEEL Left 07/25/2023   Procedure: MEMBRANE PEEL;  Surgeon: Rennis Chris, MD;  Location: [facility] OR;  Service: Ophthalmology;  Laterality: Left;  PARS PLANA VITRECTOMY Left 07/25/2023  Procedure: PARS PLANA VITRECTOMY WITH 25 GAUGE;  Surgeon: Rennis Chris, MD;  Location: [facility] OR;  Service: Ophthalmology;  Laterality: Left;  removal of back cyst    TONSILLECTOMY   Mahala Menghini., M.A. CCC-SLP Acute Rehabilitation Services Office [phone] Secure chat preferred 11/25/2023, 12:04 PM  CT Angio Chest PE W and/or Wo Contrast Result Date: 11/20/2023 CLINICAL DATA:  Pulmonary embolism (PE) suspected, low to intermediate prob, positive D-dimer Shortness of breath. EXAM: CT ANGIOGRAPHY CHEST WITH CONTRAST TECHNIQUE: Multidetector CT imaging of the chest was performed using the standard protocol during bolus administration of intravenous contrast. Multiplanar CT image reconstructions and MIPs were obtained to evaluate the vascular anatomy. RADIATION DOSE REDUCTION: This exam was performed according to the departmental dose-optimization program which includes automated exposure control, adjustment of the mA and/or kV according to patient size and/or use of iterative reconstruction technique. CONTRAST:  75mL OMNIPAQUE IOHEXOL 350 MG/ML SOLN COMPARISON:  Radiograph earlier today, CT 06/09/2023 FINDINGS: Cardiovascular: There are no filling defects within the pulmonary arteries to suggest pulmonary embolus. Mild dilated pulmonary artery at 3.5 cm. The heart is upper normal in size. No pericardial effusion. Minimal aortic atherosclerosis. The ascending aorta is dilated to 4.1 cm. No acute aortic findings. Mediastinum/Nodes: No enlarged mediastinal or hilar lymph nodes. Patulous esophagus. Small amount of fluid in the distal esophagus. No wall thickening. Lungs/Pleura: Moderate to prominent multifocal nodular and consolidative airspace disease within all lobes of both lungs, greatest involving the lower lobes. No pleural effusion. Imaging obtained in  expiration. Upper Abdomen: No acute findings. Musculoskeletal: Thoracic spondylosis. There are no acute or suspicious osseous abnormalities. Review of the MIP images confirms the above findings. IMPRESSION: 1. No pulmonary embolus. 2. Moderate to advanced multifocal nodular and consolidative airspace disease within all lobes of both lungs, typical of multifocal pneumonia. This most prominently involves the lower lobes. 3. Ascending aortic aneurysm of 4.1 cm. Recommend annual imaging followup by CTA or MRA. This recommendation follows 2010 ACCF/AHA/AATS/ACR/ASA/SCA/SCAI/SIR/STS/SVM Guidelines for the Diagnosis and Management of Patients with Thoracic Aortic Disease. Circulation. 2010; 121: V564-P329. Aortic aneurysm NOS (ICD10-I71.9) 4. Dilated main pulmonary artery can be seen with pulmonary arterial hypertension. Aortic Atherosclerosis (ICD10-I70.0). Electronically Signed   By: Narda Rutherford M.D.   On: 11/20/2023 17:40   DG Chest 2 View Result Date: 11/20/2023 CLINICAL DATA:  Shortness of breath.  Hypoxia. EXAM: CHEST - 2 VIEW COMPARISON:  Chest radiograph dated 11/15/2022. FINDINGS: Left lower lobe airspace opacity concerning  for pneumonia. No pleural effusion or pneumothorax. Stable cardiac silhouette. No acute osseous pathology. IMPRESSION: Left lower lobe pneumonia.  Follow-up to resolution recommended. Electronically Signed   By: Elgie Collard M.D.   On: 11/20/2023 16:26   Intravitreal Injection, Pharmacologic Agent - OD - Right Eye Result Date: 11/15/2023 Time Out 11/15/2023. 9:09 AM. Confirmed correct patient, procedure, site, and patient consented. Anesthesia Topical anesthesia was used. Anesthetic medications included Lidocaine 2%, Proparacaine 0.5%. Procedure Preparation included 5% betadine to ocular surface, eyelid speculum. A (32g) needle was used. Injection: 1.25 mg Bevacizumab 1.25mg /0.96ml   Route: Intravitreal, Site: Right Eye   NDC: [id number], Lot: [id number], Expiration date: 11/29/2023  Post-op Post injection exam found visual acuity of at least counting fingers. The patient tolerated the procedure well. There were no complications. The patient received written and verbal post procedure care education.   Intravitreal Injection, Pharmacologic Agent - OS - Left Eye Result Date: 11/15/2023 Time Out 11/15/2023. 9:10 AM. Confirmed correct patient, procedure, site, and patient consented. Anesthesia Topical anesthesia was used. Anesthetic medications included Lidocaine 2%, Proparacaine 0.5%. Procedure Preparation included 5% betadine to ocular surface, eyelid speculum. A (32g) needle was used. Injection: 1.25 mg Bevacizumab 1.25mg /0.41ml   Route: Intravitreal, Site: Left Eye   NDC: [id number], Lot: [id number], Expiration date: 12/19/2023 Post-op Post injection exam found visual acuity of at least counting fingers. The patient tolerated the procedure well. There were no complications. The patient received written and verbal post procedure care education.   OCT, Retina - OU - Both Eyes Result Date: 11/15/2023 Right Eye Quality was good. Central Foveal Thickness: 349. Progression has improved. Findings include no SRF, abnormal foveal contour, epiretinal membrane, intraretinal fluid (Persistent central edema greatest temporal fovea and macula -- slightly improved, +vitreous opacities). Left Eye Quality was good. Central Foveal Thickness: 346. Progression has improved. Findings include no SRF, abnormal foveal contour, intraretinal hyper-reflective material (Mild interval improvement in IRF / edema temporal and superior fovea and mac under silicone oil). Notes *Images captured and stored on drive Diagnosis / Impression: OD: Persistent central edema greatest temporal fovea and macula -- slightly improved, +vitreous opacities OS: mild interval improvement in IRF / edema temporal and superior fovea and mac under silicone oil Clinical management: See below Abbreviations: NFP - Normal foveal profile. CME -  cystoid macular edema. PED - pigment epithelial detachment. IRF - intraretinal fluid. SRF - subretinal fluid. EZ - ellipsoid zone. ERM - epiretinal membrane. ORA - outer retinal atrophy. ORT - outer retinal tubulation. SRHM - subretinal hyper-reflective material. IRHM - intraretinal hyper-reflective material   (Echo, Carotid, EGD, Colonoscopy, ERCP)    Subjective:  No significant events overnight as discussed with staff, no new complaints today Discharge Exam: Vitals:   11/26/23 0548 11/26/23 0748  BP: (!) 153/94 (!) 151/93  Pulse:  79  Resp: 16 20  Temp:  97.6 F (36.4 C)  SpO2:     Vitals:   11/25/23 1615 11/25/23 2000 11/26/23 0548 11/26/23 0748  BP: 136/82 (!) 148/95 (!) 153/94 (!) 151/93  Pulse: 91 86  79  Resp: 10 13 16 20   Temp: 98.3 F (36.8 C) 98.1 F (36.7 C)  97.6 F (36.4 C)  TempSrc: Oral Oral  Oral  SpO2: 92% 92%    Weight:        General: Pt is alert, awake, not in acute distress Cardiovascular: RRR, S1/S2 +, no rubs, no gallops Respiratory: CTA bilaterally, no wheezing, no rhonchi Abdominal: Soft, NT, ND, bowel sounds + Extremities: no edema, 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     Microbiology: Recent Results (from the past 240 hours)  Resp panel by RT-PCR (RSV, Flu A&B, Covid) Anterior Nasal Swab     Status: None   Collection Time: 11/20/23  2:00 PM   Specimen: Anterior Nasal Swab  Result Value Ref Range Status   SARS Coronavirus 2 by RT PCR NEGATIVE NEGATIVE Final   Influenza A by PCR NEGATIVE NEGATIVE Final   Influenza B by PCR NEGATIVE NEGATIVE Final    Comment: (NOTE) The Xpert Xpress SARS-CoV-2/FLU/RSV plus assay is intended as an aid in the diagnosis of influenza from Nasopharyngeal swab specimens and should not be used as a sole basis for treatment. Nasal washings and aspirates are  unacceptable for Xpert Xpress SARS-CoV-2/FLU/RSV testing.  Fact Sheet for Patients: BloggerCourse.com  Fact Sheet for Healthcare Providers: SeriousBroker.it  This test is not yet approved or cleared by the [place] FDA and has been authorized for detection and/or diagnosis of SARS-CoV-2 by FDA under an Emergency Use Authorization (EUA). This EUA will remain in effect (meaning this test can be used) for the duration of the COVID-19 declaration under Section 564(b)(1) of the Act, 21 U.S.C. section 360bbb-3(b)(1), unless the authorization is terminated or revoked.     Resp Syncytial Virus by PCR NEGATIVE NEGATIVE Final    Comment: (NOTE) Fact Sheet for Patients: BloggerCourse.com  Fact Sheet for Healthcare Providers: SeriousBroker.it  This test is not yet approved or cleared by the [place] FDA and has been authorized for detection and/or diagnosis of SARS-CoV-2 by FDA under an Emergency Use Authorization (EUA). This EUA will remain in effect (meaning this test can be used) for the duration of the COVID-19 declaration under Section 564(b)(1) of the Act, 21 U.S.C. section 360bbb-3(b)(1), unless the authorization is terminated or revoked.  Performed at [facility], [address] [place], [place] [address]   Respiratory (~20 pathogens) panel by PCR     Status: None   Collection Time: 11/20/23  2:00 PM   Specimen: Nasopharyngeal Swab; Respiratory  Result Value Ref Range Status   Adenovirus NOT DETECTED NOT DETECTED Final   Coronavirus 229E NOT DETECTED NOT DETECTED Final    Comment: (NOTE) The Coronavirus on the Respiratory Panel, DOES NOT test for the novel  Coronavirus (2019 nCoV)    Coronavirus HKU1 NOT DETECTED NOT DETECTED Final   Coronavirus NL63 NOT DETECTED NOT DETECTED Final   Coronavirus OC43 NOT DETECTED NOT DETECTED Final   Metapneumovirus NOT  DETECTED NOT DETECTED Final   Rhinovirus / Enterovirus NOT DETECTED NOT DETECTED Final   Influenza A NOT DETECTED NOT DETECTED Final   Influenza B NOT DETECTED NOT DETECTED Final   Parainfluenza Virus 1 NOT DETECTED NOT DETECTED Final   Parainfluenza Virus 2 NOT DETECTED NOT DETECTED Final   Parainfluenza Virus 3 NOT DETECTED NOT DETECTED Final   Parainfluenza Virus 4 NOT DETECTED NOT DETECTED Final   Respiratory Syncytial Virus NOT DETECTED NOT DETECTED Final   Bordetella pertussis NOT DETECTED NOT DETECTED Final   Bordetella Parapertussis NOT DETECTED NOT DETECTED Final   Chlamydophila pneumoniae NOT DETECTED NOT DETECTED Final   Mycoplasma pneumoniae NOT DETECTED NOT DETECTED Final    Comment: Performed at [facility], [address], [place], [place] [address]  Blood Culture (routine x 2)     Status: None   Collection Time:  11/20/23  2:05 PM   Specimen: BLOOD LEFT FOREARM  Result Value Ref Range Status   Specimen Description BLOOD LEFT FOREARM  Final   Special Requests   Final    BOTTLES DRAWN AEROBIC AND ANAEROBIC Blood Culture adequate volume   Culture   Final    NO GROWTH 5 DAYS Performed at [facility], [address], [place], [place] [address]    Report Status 11/25/2023 FINAL  Final  Blood Culture (routine x 2)     Status: None   Collection Time: 11/20/23  2:10 PM   Specimen: BLOOD  Result Value Ref Range Status   Specimen Description BLOOD RIGHT ANTECUBITAL  Final   Special Requests AEROBIC BOTTLE ONLY Blood Culture adequate volume  Final   Culture   Final    NO GROWTH 5 DAYS Performed at [facility], [address] [place], [place] [address]    Report Status 11/25/2023 FINAL  Final     Labs: BNP (last 3 results) No results for input(s): "BNP" in the last 8760 hours. Basic Metabolic Panel: Recent Labs  Lab 11/20/23 1410 11/21/23 0522 11/22/23 0753 11/23/23 0454 11/24/23 0520  NA 138 138 140 142 140  K 4.4 3.8 4.5 4.0 4.1  CL 104  103 103 99 99  CO2 24 26 26 28 28   GLUCOSE 294* 116* 114* 142* 137*  BUN 30* 25* 20 16 18   CREATININE 1.84* 1.51* 1.46* 1.19 1.13  CALCIUM 8.7* 8.5* 9.0 9.4 9.1  MG  --  2.0  --   --   --    Liver Function Tests: Recent Labs  Lab 11/20/23 1410 11/21/23 0522  AST 44* 27  ALT 37 30  ALKPHOS 91 85  BILITOT 0.6 0.7  PROT 6.4* 5.8*  ALBUMIN 3.3* 2.8*   No results for input(s): "LIPASE", "AMYLASE" in the last 168 hours. No results for input(s): "AMMONIA" in the last 168 hours. CBC: Recent Labs  Lab 11/20/23 1429 11/22/23 0753 11/23/23 0454 11/24/23 0520  WBC 14.1* 12.6* 7.7 6.6  NEUTROABS 12.7*  --   --   --   HGB 11.2* 12.3* 9.9* 10.5*  HCT 36.5* 40.2 31.6* 33.8*  MCV 81.8 82.4 80.2 81.1  PLT 230 254 255 257   Cardiac Enzymes: No results for input(s): "CKTOTAL", "CKMB", "CKMBINDEX", "TROPONINI" in the last 168 hours. BNP: Invalid input(s): "POCBNP" CBG: Recent Labs  Lab 11/25/23 0803 11/25/23 1156 11/25/23 1626 11/25/23 2300 11/26/23 0746  GLUCAP 149* 201* 151* 145* 162*   D-Dimer No results for input(s): "DDIMER" in the last 72 hours. Hgb A1c No results for input(s): "HGBA1C" in the last 72 hours. Lipid Profile No results for input(s): "CHOL", "HDL", "LDLCALC", "TRIG", "CHOLHDL", "LDLDIRECT" in the last 72 hours. Thyroid function studies No results for input(s): "TSH", "T4TOTAL", "T3FREE", "THYROIDAB" in the last 72 hours.  Invalid input(s): "FREET3" Anemia work up No results for input(s): "VITAMINB12", "FOLATE", "FERRITIN", "TIBC", "IRON", "RETICCTPCT" in the last 72 hours. Urinalysis    Component Value Date/Time   COLORURINE YELLOW 11/16/2022 0210   APPEARANCEUR CLEAR 11/16/2022 0210   LABSPEC 1.027 11/16/2022 0210   PHURINE 5.0 11/16/2022 0210   GLUCOSEU >=500 (A) 11/16/2022 0210   HGBUR NEGATIVE 11/16/2022 0210   BILIRUBINUR NEGATIVE 11/16/2022 0210   KETONESUR NEGATIVE 11/16/2022 0210   PROTEINUR NEGATIVE 11/16/2022 0210   NITRITE NEGATIVE  11/16/2022 0210   LEUKOCYTESUR NEGATIVE 11/16/2022 0210   Sepsis Labs Recent Labs  Lab 11/20/23 1429 11/22/23 0753 11/23/23  0454 11/24/23 0520  WBC 14.1* 12.6* 7.7 6.6   Microbiology Recent Results (from the past 240 hours)  Resp panel by RT-PCR (RSV, Flu A&B, Covid) Anterior Nasal Swab     Status: None   Collection Time: 11/20/23  2:00 PM   Specimen: Anterior Nasal Swab  Result Value Ref Range Status   SARS Coronavirus 2 by RT PCR NEGATIVE NEGATIVE Final   Influenza A by PCR NEGATIVE NEGATIVE Final   Influenza B by PCR NEGATIVE NEGATIVE Final    Comment: (NOTE) The Xpert Xpress SARS-CoV-2/FLU/RSV plus assay is intended as an aid in the diagnosis of influenza from Nasopharyngeal swab specimens and should not be used as a sole basis for treatment. Nasal washings and aspirates are unacceptable for Xpert Xpress SARS-CoV-2/FLU/RSV testing.  Fact Sheet for Patients: BloggerCourse.com  Fact Sheet for Healthcare Providers: SeriousBroker.it  This test is not yet approved or cleared by the [place] FDA and has been authorized for detection and/or diagnosis of SARS-CoV-2 by FDA under an Emergency Use Authorization (EUA). This EUA will remain in effect (meaning this test can be used) for the duration of the COVID-19 declaration under Section 564(b)(1) of the Act, 21 U.S.C. section 360bbb-3(b)(1), unless the authorization is terminated or revoked.     Resp Syncytial Virus by PCR NEGATIVE NEGATIVE Final    Comment: (NOTE) Fact Sheet for Patients: BloggerCourse.com  Fact Sheet for Healthcare Providers: SeriousBroker.it  This test is not yet approved or cleared by the [place] FDA and has been authorized for detection and/or diagnosis of SARS-CoV-2 by FDA under an Emergency Use Authorization (EUA). This EUA will remain in effect (meaning this test can be used) for the  duration of the COVID-19 declaration under Section 564(b)(1) of the Act, 21 U.S.C. section 360bbb-3(b)(1), unless the authorization is terminated or revoked.  Performed at [facility], [address], [place], [place] [address]   Respiratory (~20 pathogens) panel by PCR     Status: None   Collection Time: 11/20/23  2:00 PM   Specimen: Nasopharyngeal Swab; Respiratory  Result Value Ref Range Status   Adenovirus NOT DETECTED NOT DETECTED Final   Coronavirus 229E NOT DETECTED NOT DETECTED Final    Comment: (NOTE) The Coronavirus on the Respiratory Panel, DOES NOT test for the novel  Coronavirus (2019 nCoV)    Coronavirus HKU1 NOT DETECTED NOT DETECTED Final   Coronavirus NL63 NOT DETECTED NOT DETECTED Final   Coronavirus OC43 NOT DETECTED NOT DETECTED Final   Metapneumovirus NOT DETECTED NOT DETECTED Final   Rhinovirus / Enterovirus NOT DETECTED NOT DETECTED Final   Influenza A NOT DETECTED NOT DETECTED Final   Influenza B NOT DETECTED NOT DETECTED Final   Parainfluenza Virus 1 NOT DETECTED NOT DETECTED Final   Parainfluenza Virus 2 NOT DETECTED NOT DETECTED Final   Parainfluenza Virus 3 NOT DETECTED NOT DETECTED Final   Parainfluenza Virus 4 NOT DETECTED NOT DETECTED Final   Respiratory Syncytial Virus NOT DETECTED NOT DETECTED Final   Bordetella pertussis NOT DETECTED NOT DETECTED Final   Bordetella Parapertussis NOT DETECTED NOT DETECTED Final   Chlamydophila pneumoniae NOT DETECTED NOT DETECTED Final   Mycoplasma pneumoniae NOT DETECTED NOT DETECTED Final    Comment: Performed at [facility], [address], [place], [place] [address]  Blood Culture (routine x 2)     Status: None   Collection Time: 11/20/23  2:05 PM   Specimen: BLOOD LEFT FOREARM  Result Value Ref Range Status   Specimen  Description BLOOD LEFT FOREARM  Final   Special Requests   Final    BOTTLES DRAWN AEROBIC AND ANAEROBIC Blood Culture adequate volume   Culture   Final    NO GROWTH 5  DAYS Performed at [facility], [address], [place], [place] [address]    Report Status 11/25/2023 FINAL  Final  Blood Culture (routine x 2)     Status: None   Collection Time: 11/20/23  2:10 PM   Specimen: BLOOD  Result Value Ref Range Status   Specimen Description BLOOD RIGHT ANTECUBITAL  Final   Special Requests AEROBIC BOTTLE ONLY Blood Culture adequate volume  Final   Culture   Final    NO GROWTH 5 DAYS Performed at [facility], [address], [place], [place] [address]    Report Status 11/25/2023 FINAL  Final     Time coordinating discharge: Over 30 minutes  SIGNED:   Huey Bienenstock, MD  [facility] 11/26/2023, 9:57 AM Pager   If 7PM-7AM, please contact night-coverage [email or web address] Password [email or web address]

## 2023-11-26 NOTE — Plan of Care (Signed)

## 2023-12-04 NOTE — Progress Notes (Signed)
 [facility] - Clinic Note  12/13/2023   CHIEF COMPLAINT Patient presents for Retina Follow Up  HISTORY OF PRESENT ILLNESS: Brett White is a 63 y.o. male who presents to the clinic today for:  HPI     Retina Follow Up   Patient presents with  Diabetic Retinopathy.  In both eyes.  This started weeks ago.  Duration of 4 weeks.  Since onset it is gradually worsening.  I, the attending physician,  performed the HPI with the patient and updated documentation appropriately.        Comments   4 week retina follow up PDR OU and IVA OU. Pt states vision in OS getting worse and it looks like he is looking through water and continues watering. Pt states his left eye is also more sensitive to light and he needs to wear sunglasses indoors most of the time. Pt is given moisturizing drops 2-3 times per day at his facility but he does not know the name of them. Pt states his BS was 120 and hasn't gone over 150 for the past 2-3 weeks. Pt's last A1c was 1 month ago. Pt was in the hospital 4 weeks ago with double pneumonia.  Pt.denies any flashes has the same floaters. Pt had cataract surgery in January OU with Dr Zenaida Niece. Pt states he saw Ngyuen last week and she told him to come see Dr. Vanessa Barbara for his left eye.      Last edited by Rennis Chris, MD on 12/13/2023  6:29 PM.     Pt states vision is stable, his blood sugar and blood pressure have been okay   Referring physician: Diona Foley, MD [address] [place],  [place] [address]  HISTORICAL INFORMATION:  Selected notes from the MEDICAL RECORD NUMBER Referred by Dr. Zenaida Niece for PDR w/ [facility] OU LEE:  Ocular Hx- PMH-   CURRENT MEDICATIONS: Current Outpatient Medications (Ophthalmic Drugs)  Medication Sig   bacitracin-polymyxin b (POLYSPORIN) ophthalmic ointment Place into the right eye 4 (four) times daily. Place a 1/2 inch ribbon of ointment into the lower eyelid 4 a day and as needed for discomfort   brimonidine (ALPHAGAN)  0.2 % ophthalmic solution Place 1 drop into the left eye in the morning and at bedtime.   carboxymethylcellulose 1 % ophthalmic solution Place 4 drops into the left eye in the morning, at noon, in the evening, and at bedtime.   ketorolac (ACULAR) 0.4 % SOLN Place 1 drop into the left eye 4 (four) times daily.   moxifloxacin (VIGAMOX) 0.5 % ophthalmic solution Place 1 drop into the left eye in the morning and at bedtime. For 7 days   prednisoLONE acetate (PRED FORTE) 1 % ophthalmic suspension Place 1 drop into the left eye 4 (four) times daily.   No current facility-administered medications for this visit. (Ophthalmic Drugs)   Current Outpatient Medications (Other)  Medication Sig   acetaminophen (TYLENOL) 325 MG tablet Take 2 tablets (650 mg total) by mouth every 6 (six) hours as needed for mild pain (or Fever >/= 101).   amLODipine (NORVASC) 10 MG tablet Take 10 mg by mouth every morning.   atorvastatin (LIPITOR) 40 MG tablet Take 40 mg by mouth at bedtime.   bisacodyl (DULCOLAX) 5 MG EC tablet Take 10 mg by mouth daily as needed (CONSTIPATION).   cyclobenzaprine (FLEXERIL) 5 MG tablet Take 1 tablet (5 mg total) by mouth 3 (three) times daily as needed for muscle spasms.   DULoxetine (CYMBALTA)  60 MG capsule Take 60 mg by mouth daily.   ergocalciferol (VITAMIN D2) 1.25 MG (50000 UT) capsule Take 50,000 Units by mouth once a week. Fridays   famotidine (PEPCID) 20 MG tablet Take 20 mg by mouth daily.   FARXIGA 10 MG TABS tablet Take 10 mg by mouth every morning. Dapaglifozin   furosemide (LASIX) 20 MG tablet Take 20 mg by mouth every morning.   glipiZIDE (GLUCOTROL) 10 MG tablet Take 10 mg by mouth every morning.   guaiFENesin (ROBITUSSIN) 100 MG/5ML liquid Take 10 mLs by mouth every 4 (four) hours as needed for cough or to loosen phlegm.   insulin lispro (HUMALOG) 100 UNIT/ML KwikPen Inject 2-8 Units into the skin 3 (three) times daily with meals. Sliding scale 201-250=2 units 251-300=4  units 301-350=6 units 351-400= 8 units 401 or greater than=call [facility]   ipratropium-albuterol (DUONEB) 0.5-2.5 (3) MG/3ML SOLN Take 3 mLs by nebulization every 4 (four) hours as needed.   LINZESS 145 MCG CAPS capsule Take 145 mcg by mouth daily.   losartan (COZAAR) 50 MG tablet Take 1 tablet (50 mg total) by mouth daily.   magnesium hydroxide (MILK OF MAGNESIA) 400 MG/5ML suspension Take 30 mLs by mouth daily as needed for mild constipation.   Melatonin 5 MG CAPS Take 10 mg by mouth at bedtime.   naloxone (NARCAN) nasal spray 4 mg/0.1 mL Place 1 spray into the nose See admin instructions. Every 2-3 minutesas needed until patient response/ emba arrived.   oxyCODONE-acetaminophen (PERCOCET) 10-325 MG tablet Take 1 tablet by mouth every 4 (four) hours as needed for pain.   pantoprazole (PROTONIX) 40 MG tablet Take 1 tablet (40 mg total) by mouth daily.   polyethylene glycol (MIRALAX / GLYCOLAX) 17 g packet Take 17 g by mouth daily. (Patient taking differently: Take 17 g by mouth daily. With 4 oz of fluids and drink)   pregabalin (LYRICA) 100 MG capsule Take 1 capsule (100 mg total) by mouth 2 (two) times daily. (Patient taking differently: Take 100 mg by mouth 4 (four) times daily.)   sertraline (ZOLOFT) 50 MG tablet Take 50 mg by mouth daily.   SODIUM PHOSPHATES RE Place 1 application  rectally daily. As needed constipation 3 of 4   traZODone (DESYREL) 150 MG tablet Take 75 mg by mouth at bedtime.   TRULICITY 1.5 MG/0.5ML SOPN Inject 0.5 mg into the skin once a week. Thursdays   hydrocortisone cream 1 % Apply 1 Application topically daily. Apply to right lower leg for wound care (Patient not taking: Reported on 11/20/2023)   hydrOXYzine (ATARAX) 10 MG tablet Take 1 tablet (10 mg total) by mouth 3 (three) times daily as needed for itching. (Patient not taking: Reported on 11/20/2023)   Polyethylene Glycol 3350 POWD Take 17 g by mouth every morning. Mix with 4 oz of fluids (Patient not taking: Reported  on 11/20/2023)   No current facility-administered medications for this visit. (Other)   REVIEW OF SYSTEMS: ROS   Positive for: Musculoskeletal, Endocrine, Cardiovascular, Eyes Negative for: Constitutional, Gastrointestinal, Neurological, Skin, Genitourinary, HENT, Respiratory, Psychiatric, Allergic/Imm, Heme/Lymph Last edited by Elicia Lamp, COT on 12/13/2023 10:24 AM.      ALLERGIES No Known Allergies  PAST MEDICAL HISTORY Past Medical History:  Diagnosis Date   CAP (community acquired pneumonia) 12/09/2021   CKD (chronic kidney disease)    GERD (gastroesophageal reflux disease)    HTN (hypertension)    Insulin dependent type 2 diabetes mellitus (HCC)    Neuropathy  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   Stroke [facility])    Past Surgical History:  Procedure Laterality Date   ABDOMINAL AORTOGRAM W/LOWER EXTREMITY N/A 06/20/2023   Procedure: ABDOMINAL AORTOGRAM W/LOWER EXTREMITY;  Surgeon: Cephus Shelling, MD;  Location: [facility] INVASIVE CV LAB;  Service: Cardiovascular;  Laterality: N/A;   AMPUTATION Left 02/04/2021   Procedure: LEFT GREAT TOE AMPUTATION;  Surgeon: [name], MD;  Location: [facility] OR;  Service: Orthopedics;  Laterality: Left;   AMPUTATION Left 02/10/2021   Procedure: LEFT FOOT 1ST RAY AMPUTATION;  Surgeon: [name], MD;  Location: [facility] OR;  Service: Orthopedics;  Laterality: Left;   AMPUTATION Left 03/22/2021   Procedure: LEFT BELOW KNEE AMPUTATION;  Surgeon: [name], MD;  Location: [facility] OR;  Service: Orthopedics;  Laterality: Left;   APPENDECTOMY     BACK SURGERY     CATARACT EXTRACTION     I & D EXTREMITY Left 03/03/2021   Procedure: LEFT FOOT DEBRIDEMENT;  Surgeon: [name], MD;  Location: [facility] OR;  Service: Orthopedics;  Laterality: Left;   INCISION AND DRAINAGE ABSCESS N/A 10/06/2021   Procedure: INCISION AND DRAINAGE BACK ABSCESS;  Surgeon: Berna Bue, MD;  Location: [facility] OR;  Service: General;  Laterality: N/A;    INJECTION OF SILICONE OIL Left 07/25/2023   Procedure: INJECTION OF SILICONE OIL;  Surgeon: Rennis Chris, MD;  Location: [facility] OR;  Service: Ophthalmology;  Laterality: Left;   MEMBRANE PEEL Left 07/25/2023   Procedure: MEMBRANE PEEL;  Surgeon: Rennis Chris, MD;  Location: [facility] OR;  Service: Ophthalmology;  Laterality: Left;   PARS PLANA VITRECTOMY Left 07/25/2023   Procedure: PARS PLANA VITRECTOMY WITH 25 GAUGE;  Surgeon: Rennis Chris, MD;  Location: [facility] OR;  Service: Ophthalmology;  Laterality: Left;   removal of back cyst     TONSILLECTOMY     FAMILY HISTORY Family History  Problem Relation Age of Onset   Diabetes Mother    Heart attack Neg Hx    SOCIAL HISTORY Social History   Tobacco Use   Smoking status: Never   Smokeless tobacco: Never  Vaping Use   Vaping status: Never Used  Substance Use Topics   Alcohol use: Not Currently   Drug use: Not Currently       OPHTHALMIC EXAM:  Base Eye Exam     Visual Acuity (Snellen - Linear)       Right Left   Dist [place] 20/70 -1 CF at 1'   Dist ph [place] 20/40 NI         Tonometry (Tonopen, 10:31 AM)       Right Left   Pressure 13 14         Pupils       Dark Light Shape React APD   Right 3 2 Round Minimal None   Left 5 5 Round Minimal Trace         Visual Fields       Left Right     Full   Restrictions Total superior temporal, inferior temporal, superior nasal, inferior nasal deficiencies          Extraocular Movement       Right Left    Full, Ortho Full, Ortho         Neuro/Psych     Oriented x3: Yes   Mood/Affect: Normal         Dilation     Both eyes: 1.0% Mydriacyl, 2.5% Phenylephrine @ 10:32 AM  Slit Lamp and Fundus Exam     External Exam       Right Left   External Normal Normal         Slit Lamp Exam       Right Left   Lids/Lashes Dermatochalasis - upper lid Dermatochalasis - upper lid   Conjunctiva/Sclera White and quiet nasal and temporal pinguecula   Cornea  1-2+ pigmented guttata, well healed cataract wound 1-2+PEE, mild arcus, well healed cataract wound, 2+ Guttata   Anterior Chamber Deep and quiet moderate depth, narrow angles   Iris Round and reactive Round and dilated, No NVI   Lens PC IOL in good position, trace posterior capsular opacification PC IOL in good position, trace Posterior capsular opacification   Anterior Vitreous Vitreous syneresis, Posterior vitreous detachment, blood stained Vitreous condensations settling inferiorly post vitrectomy, good silicone oil fill         Fundus Exam       Right Left   Disc Pink and sharp, no NVD 2+ Pallor, Sharp rim   C/D Ratio 0.5 0.3   Macula Flat, Blunted foveal reflex, scattered MA/DBH, +edema central and temporal macula, +exudates superior fovea and macula flat under oil, scattered MA/DBH, +edema temporal macula and fovea -- slightly increased   Vessels attenuated, Tortuous attenuated, Tortuous   Periphery Attached, scattered MA/DBH, inferior retina obsured by [facility] / blood clots Inferonasal retina reattached under oil; 360 PRP laser changes, PRE-OP: 360 MA/DBH, focal tractional RD IN quad -- focal tractional fibrosis / NVE along IN arcades - SRF extends posteriorly to IN disc           IMAGING AND PROCEDURES  Imaging and Procedures for 12/13/2023  OCT, Retina - OU - Both Eyes       Right Eye Quality was good. Central Foveal Thickness: 329. Progression has improved. Findings include no SRF, abnormal foveal contour, epiretinal membrane, intraretinal fluid (Mild interval improvement in central edema greatest temporal fovea and macula, +vitreous opacities -- slightly improved).   Left Eye Quality was good. Central Foveal Thickness: 386. Progression has worsened. Findings include no SRF, abnormal foveal contour, intraretinal hyper-reflective material (Mild interval increase in IRF / edema temporal and superior fovea and mac under silicone oil).   Notes *Images captured and stored on  drive  Diagnosis / Impression:  OD: Mild interval improvement in central edema greatest temporal fovea and macula, +vitreous opacities -- slightly improved OS: Mild interval increase in IRF / edema temporal and superior fovea and mac under silicone oil  Clinical management:  See below  Abbreviations: NFP - Normal foveal profile. CME - cystoid macular edema. PED - pigment epithelial detachment. IRF - intraretinal fluid. SRF - subretinal fluid. EZ - ellipsoid zone. ERM - epiretinal membrane. ORA - outer retinal atrophy. ORT - outer retinal tubulation. SRHM - subretinal hyper-reflective material. IRHM - intraretinal hyper-reflective material      Intravitreal Injection, Pharmacologic Agent - OD - Right Eye       Time Out 12/13/2023. 12:15 PM. Confirmed correct patient, procedure, site, and patient consented.   Anesthesia Topical anesthesia was used. Anesthetic medications included Lidocaine 2%, Proparacaine 0.5%.   Procedure Preparation included 5% betadine to ocular surface, eyelid speculum. A (32g) needle was used.   Injection: 1.25 mg Bevacizumab 1.25mg /0.45ml   Route: Intravitreal, Site: Right Eye   NDC: P3213405, Lot: [id number] , Expiration date: 01/06/2024   Post-op Post injection exam found visual acuity of at least counting fingers. The patient tolerated the procedure well. There were no  complications. The patient received written and verbal post procedure care education.      Intravitreal Injection, Pharmacologic Agent - OS - Left Eye       Time Out 12/13/2023. 12:15 PM. Confirmed correct patient, procedure, site, and patient consented.   Anesthesia Topical anesthesia was used. Anesthetic medications included Lidocaine 2%, Proparacaine 0.5%.   Procedure Preparation included 5% betadine to ocular surface, eyelid speculum. A supplied (32g) needle was used.   Injection: 1.25 mg Bevacizumab 1.25mg /0.1ml   Route: Intravitreal, Site: Left Eye   NDC: P3213405,  Lot: [id number], Expiration date: 02/02/2024   Post-op Post injection exam found visual acuity of at least counting fingers. The patient tolerated the procedure well. There were no complications. The patient received written and verbal post procedure care education.           ASSESSMENT/PLAN:   ICD-10-CM   1. 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 Diabetes mellitus treated with oral medication (HCC)  E11.9    Z79.84     3. Encounter for long-term (current) use of insulin (HCC)  Z79.4     4. Diabetes mellitus treated with injections of non-insulin medication (HCC)  E11.9    Z79.85     5. Vitreous hemorrhage of both eyes (HCC)  H43.13     6. Retinal detachment, tractional, left eye  H33.42     7. Essential hypertension  I10     8. Hypertensive retinopathy of both eyes  H35.033     9. Pseudophakia, both eyes  Z96.1      1-5. 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 - BCVA OD 20/100 from CF; OS CF -- stable - exam shows interval improvement in diffuse VH OD; OS stably reattached under oil - OCT shows: OD: Mild interval improvement in central  edema greatest temporal fovea and macula, +vitreous opacities -- slightly improved; OS: Mild interval increase in IRF / edema temporal and superior fovea and mac under silicone oil - recommend IVA OU #6 today, 03.14.25 for persistent DME w/ f/u in 4 wks again - pt wishes to proceed with injections - RBA of procedure discussed, questions answered - IVA informed consent obtained and signed, 10.16.24 (OU) - see procedure note - cont PF 4x/day OS  Brimonidine BID OS PSO ung QHS OS  - post op drop and positioning instructions reviewed  - tylenol/ibuprofen for pain  - f/u 4 weeks, DFE, OCT  6. Traction retinal detachment, OS - focal traction RD IN quad--focal traction fibrosis along IN arcades, SRF extended IN disc - s/p PPV/MP/PFO/EL/SO OS, 10.24.2024 -- see above - retina  reattached under oil - drops as above   7,8. Hypertensive retinopathy OU - discussed importance of tight BP control - monitor   9. Pseudophakia OU  - s/p CE/IOL OU (Dr. Zenaida Niece)  - IOLs in good position, doing well  - monitor   Ophthalmic Meds Ordered this visit:  Meds ordered this encounter  Medications   Bevacizumab (AVASTIN) SOLN 1.25 mg   Bevacizumab (AVASTIN) SOLN 1.25 mg     Return in about 4 weeks (around 01/10/2024) for f/u PDR OU, DFE, OCT, Possible Injxn.  This document serves as a record of services personally performed by Karie Chimera, MD, PhD. It was created on their behalf by Berlin Hun COT, an ophthalmic technician. The creation of this record is the provider's dictation and/or activities during the visit.    Electronically signed by: Berlin Hun COT 03.05.25 12:44 AM  This document serves as a record of services personally performed by Karie Chimera, MD, PhD. It was created on their behalf by Glee Arvin. Manson Passey, OA an ophthalmic technician. The creation of this record is the provider's dictation and/or activities during the visit.    Electronically signed by: Glee Arvin. Manson Passey, OA  12/14/23 12:44 AM  Karie Chimera, M.D., Ph.D. Diseases & Surgery of the Retina and Vitreous [facility] 12/13/2023   I have reviewed the above documentation for accuracy and completeness, and I agree with the above. Karie Chimera, M.D., Ph.D. 12/14/23 12:45 AM   Abbreviations: M myopia (nearsighted); A astigmatism; H hyperopia (farsighted); P presbyopia; Mrx spectacle prescription;  CTL contact lenses; OD right eye; OS left eye; OU both eyes  XT exotropia; ET esotropia; PEK punctate epithelial keratitis; PEE punctate epithelial erosions; DES dry eye syndrome; MGD meibomian gland dysfunction; ATs artificial tears; PFAT's preservative free artificial tears; NSC nuclear sclerotic cataract; PSC posterior subcapsular cataract; ERM epi-retinal membrane; PVD posterior vitreous detachment; RD retinal detachment; DM diabetes mellitus; DR diabetic retinopathy; NPDR non-proliferative diabetic retinopathy; PDR proliferative diabetic retinopathy; CSME clinically significant macular edema; DME diabetic macular edema; dbh dot blot hemorrhages; CWS cotton wool spot; POAG primary open angle glaucoma; C/D cup-to-disc ratio; HVF humphrey visual field; GVF goldmann visual field; OCT optical coherence tomography; IOP intraocular pressure; BRVO Branch retinal vein occlusion; CRVO central retinal vein occlusion; CRAO central retinal artery occlusion; BRAO branch retinal artery occlusion; RT retinal tear; SB scleral buckle; PPV pars plana vitrectomy; VH Vitreous hemorrhage; PRP panretinal laser photocoagulation; IVK intravitreal kenalog; VMT vitreomacular traction; MH Macular hole;  NVD neovascularization of the disc; NVE neovascularization elsewhere; AREDS age related eye disease study; ARMD age related macular degeneration; POAG primary open angle glaucoma; EBMD epithelial/anterior basement membrane dystrophy; ACIOL anterior chamber intraocular lens; IOL intraocular lens; PCIOL posterior chamber  intraocular lens; Phaco/IOL phacoemulsification with intraocular lens placement; PRK photorefractive keratectomy; LASIK laser assisted in situ keratomileusis; HTN hypertension; DM diabetes mellitus; COPD chronic obstructive pulmonary disease

## 2023-12-13 ENCOUNTER — Encounter (INDEPENDENT_AMBULATORY_CARE_PROVIDER_SITE_OTHER): Payer: Self-pay | Admitting: Ophthalmology

## 2023-12-13 ENCOUNTER — Ambulatory Visit (INDEPENDENT_AMBULATORY_CARE_PROVIDER_SITE_OTHER): Payer: Medicare Other | Admitting: Ophthalmology

## 2023-12-13 DIAGNOSIS — E113513 Type 2 diabetes mellitus with proliferative diabetic retinopathy with macular edema, bilateral: Secondary | ICD-10-CM

## 2023-12-13 DIAGNOSIS — I10 Essential (primary) hypertension: Secondary | ICD-10-CM

## 2023-12-13 DIAGNOSIS — H3342 Traction detachment of retina, left eye: Secondary | ICD-10-CM | POA: Diagnosis not present

## 2023-12-13 DIAGNOSIS — Z7984 Long term (current) use of oral hypoglycemic drugs: Secondary | ICD-10-CM

## 2023-12-13 DIAGNOSIS — H4313 Vitreous hemorrhage, bilateral: Secondary | ICD-10-CM | POA: Diagnosis not present

## 2023-12-13 DIAGNOSIS — E119 Type 2 diabetes mellitus without complications: Secondary | ICD-10-CM

## 2023-12-13 DIAGNOSIS — Z7985 Long-term (current) use of injectable non-insulin antidiabetic drugs: Secondary | ICD-10-CM

## 2023-12-13 DIAGNOSIS — Z961 Presence of intraocular lens: Secondary | ICD-10-CM

## 2023-12-13 DIAGNOSIS — Z794 Long term (current) use of insulin: Secondary | ICD-10-CM

## 2023-12-13 DIAGNOSIS — H35033 Hypertensive retinopathy, bilateral: Secondary | ICD-10-CM

## 2023-12-13 MED ORDER — BEVACIZUMAB CHEMO INJECTION 1.25MG/0.05ML SYRINGE FOR KALEIDOSCOPE
1.2500 mg | INTRAVITREAL | Status: AC | PRN
  Administered 2023-12-13: 1.25 mg via INTRAVITREAL

## 2023-12-13 MED ORDER — BEVACIZUMAB CHEMO INJECTION 1.25MG/0.05ML SYRINGE FOR KALEIDOSCOPE
1.2500 mg | INTRAVITREAL | Status: AC | PRN
Start: 2023-12-13 — End: 2023-12-13
  Administered 2023-12-13: 1.25 mg via INTRAVITREAL

## 2024-01-09 NOTE — Progress Notes (Signed)
 [facility] - Clinic Note  01/10/2024   CHIEF COMPLAINT Patient presents for Retina Follow Up  HISTORY OF PRESENT ILLNESS: Brett White is a 63 y.o. male who presents to the clinic today for:  HPI     Retina Follow Up   Patient presents with  Diabetic Retinopathy.  In both eyes.  This started 4 weeks ago.  I, the attending physician,  performed the HPI with the patient and updated documentation appropriately.        Comments   Patient here for 4 weeks retina follow up for PDR OU. Patient came in with patch over OS. Patient states vision not good. OD is fine. OS is not. Sees nothing but blurriness. OS has eye pain. Uses drops 3 - 4 times a day.      Last edited by Rennis Chris, MD on 01/10/2024 11:57 AM.    Pt states    Referring physician: Diona Foley, MD [address] [place],  [place] [address]  HISTORICAL INFORMATION:  Selected notes from the MEDICAL RECORD NUMBER Referred by Dr. Zenaida Niece for PDR w/ [facility] OU LEE:  Ocular Hx- PMH-   CURRENT MEDICATIONS: Current Outpatient Medications (Ophthalmic Drugs)  Medication Sig   bacitracin-polymyxin b (POLYSPORIN) ophthalmic ointment Place into the right eye 4 (four) times daily. Place a 1/2 inch ribbon of ointment into the lower eyelid 4 a day and as needed for discomfort   brimonidine (ALPHAGAN) 0.2 % ophthalmic solution Place 1 drop into the left eye in the morning and at bedtime.   carboxymethylcellulose 1 % ophthalmic solution Place 4 drops into the left eye in the morning, at noon, in the evening, and at bedtime.   ketorolac (ACULAR) 0.4 % SOLN Place 1 drop into the left eye 4 (four) times daily.   moxifloxacin (VIGAMOX) 0.5 % ophthalmic solution Place 1 drop into the left eye in the morning and at bedtime. For 7 days   prednisoLONE acetate (PRED FORTE) 1 % ophthalmic suspension Place 1 drop into the left eye 4 (four) times daily.   No current facility-administered medications for this visit.  (Ophthalmic Drugs)   Current Outpatient Medications (Other)  Medication Sig   acetaminophen (TYLENOL) 325 MG tablet Take 2 tablets (650 mg total) by mouth every 6 (six) hours as needed for mild pain (or Fever >/= 101).   amLODipine (NORVASC) 10 MG tablet Take 10 mg by mouth every morning.   atorvastatin (LIPITOR) 40 MG tablet Take 40 mg by mouth at bedtime.   bisacodyl (DULCOLAX) 5 MG EC tablet Take 10 mg by mouth daily as needed (CONSTIPATION).   cyclobenzaprine (FLEXERIL) 5 MG tablet Take 1 tablet (5 mg total) by mouth 3 (three) times daily as needed for muscle spasms.   DULoxetine (CYMBALTA) 60 MG capsule Take 60 mg by mouth daily.   ergocalciferol (VITAMIN D2) 1.25 MG (50000 UT) capsule Take 50,000 Units by mouth once a week. Fridays   famotidine (PEPCID) 20 MG tablet Take 20 mg by mouth daily.   FARXIGA 10 MG TABS tablet Take 10 mg by mouth every morning. Dapaglifozin   furosemide (LASIX) 20 MG tablet Take 20 mg by mouth every morning.   glipiZIDE (GLUCOTROL) 10 MG tablet Take 10 mg by mouth every morning.   guaiFENesin (ROBITUSSIN) 100 MG/5ML liquid Take 10 mLs by mouth every 4 (four) hours as needed for cough or to loosen phlegm.   insulin lispro (HUMALOG) 100 UNIT/ML KwikPen Inject 2-8 Units into the skin 3 (  three) times daily with meals. Sliding scale 201-250=2 units 251-300=4 units 301-350=6 units 351-400= 8 units 401 or greater than=call [facility]   ipratropium-albuterol (DUONEB) 0.5-2.5 (3) MG/3ML SOLN Take 3 mLs by nebulization every 4 (four) hours as needed.   LINZESS 145 MCG CAPS capsule Take 145 mcg by mouth daily.   losartan (COZAAR) 50 MG tablet Take 1 tablet (50 mg total) by mouth daily.   magnesium hydroxide (MILK OF MAGNESIA) 400 MG/5ML suspension Take 30 mLs by mouth daily as needed for mild constipation.   Melatonin 5 MG CAPS Take 10 mg by mouth at bedtime.   naloxone (NARCAN) nasal spray 4 mg/0.1 mL Place 1 spray into the nose See admin instructions. Every 2-3 minutesas  needed until patient response/ emba arrived.   oxyCODONE-acetaminophen (PERCOCET) 10-325 MG tablet Take 1 tablet by mouth every 4 (four) hours as needed for pain.   pantoprazole (PROTONIX) 40 MG tablet Take 1 tablet (40 mg total) by mouth daily.   polyethylene glycol (MIRALAX / GLYCOLAX) 17 g packet Take 17 g by mouth daily. (Patient taking differently: Take 17 g by mouth daily. With 4 oz of fluids and drink)   pregabalin (LYRICA) 100 MG capsule Take 1 capsule (100 mg total) by mouth 2 (two) times daily. (Patient taking differently: Take 100 mg by mouth 4 (four) times daily.)   sertraline (ZOLOFT) 50 MG tablet Take 50 mg by mouth daily.   SODIUM PHOSPHATES RE Place 1 application  rectally daily. As needed constipation 3 of 4   traZODone (DESYREL) 150 MG tablet Take 75 mg by mouth at bedtime.   TRULICITY 1.5 MG/0.5ML SOPN Inject 0.5 mg into the skin once a week. Thursdays   hydrocortisone cream 1 % Apply 1 Application topically daily. Apply to right lower leg for wound care (Patient not taking: Reported on 11/20/2023)   hydrOXYzine (ATARAX) 10 MG tablet Take 1 tablet (10 mg total) by mouth 3 (three) times daily as needed for itching. (Patient not taking: Reported on 11/20/2023)   Polyethylene Glycol 3350 POWD Take 17 g by mouth every morning. Mix with 4 oz of fluids (Patient not taking: Reported on 11/20/2023)   No current facility-administered medications for this visit. (Other)   REVIEW OF SYSTEMS: ROS   Positive for: Musculoskeletal, Endocrine, Cardiovascular, Eyes Negative for: Constitutional, Gastrointestinal, Neurological, Skin, Genitourinary, HENT, Respiratory, Psychiatric, Allergic/Imm, Heme/Lymph Last edited by Laddie Aquas, COA on 01/10/2024  9:45 AM.     ALLERGIES No Known Allergies  PAST MEDICAL HISTORY Past Medical History:  Diagnosis Date   CAP (community acquired pneumonia) 12/09/2021   CKD (chronic kidney disease)    GERD (gastroesophageal reflux disease)    HTN  (hypertension)    Insulin dependent type 2 diabetes mellitus (HCC)    Neuropathy    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   Stroke [facility])    Past Surgical History:  Procedure Laterality Date   ABDOMINAL AORTOGRAM W/LOWER EXTREMITY N/A 06/20/2023   Procedure: ABDOMINAL AORTOGRAM W/LOWER EXTREMITY;  Surgeon: Cephus Shelling, MD;  Location: [facility] INVASIVE CV LAB;  Service: Cardiovascular;  Laterality: N/A;   AMPUTATION Left 02/04/2021   Procedure: LEFT GREAT TOE AMPUTATION;  Surgeon: [name], MD;  Location: [facility] OR;  Service: Orthopedics;  Laterality: Left;   AMPUTATION Left 02/10/2021   Procedure: LEFT FOOT 1ST RAY AMPUTATION;  Surgeon: [name], MD;  Location: [facility] OR;  Service: Orthopedics;  Laterality: Left;   AMPUTATION Left 03/22/2021   Procedure: LEFT  BELOW KNEE AMPUTATION;  Surgeon: [name], MD;  Location: [facility] OR;  Service: Orthopedics;  Laterality: Left;   APPENDECTOMY     BACK SURGERY     CATARACT EXTRACTION     I & D EXTREMITY Left 03/03/2021   Procedure: LEFT FOOT DEBRIDEMENT;  Surgeon: [name], MD;  Location: [facility] OR;  Service: Orthopedics;  Laterality: Left;   INCISION AND DRAINAGE ABSCESS N/A 10/06/2021   Procedure: INCISION AND DRAINAGE BACK ABSCESS;  Surgeon: Berna Bue, MD;  Location: [facility] OR;  Service: General;  Laterality: N/A;   INJECTION OF SILICONE OIL Left 07/25/2023   Procedure: INJECTION OF SILICONE OIL;  Surgeon: Rennis Chris, MD;  Location: [facility] OR;  Service: Ophthalmology;  Laterality: Left;   MEMBRANE PEEL Left 07/25/2023   Procedure: MEMBRANE PEEL;  Surgeon: Rennis Chris, MD;  Location: [facility] OR;  Service: Ophthalmology;  Laterality: Left;   PARS PLANA VITRECTOMY Left 07/25/2023   Procedure: PARS PLANA VITRECTOMY WITH 25 GAUGE;  Surgeon: Rennis Chris, MD;  Location: [facility] OR;  Service: Ophthalmology;  Laterality: Left;   removal of back cyst     TONSILLECTOMY     FAMILY HISTORY Family History  Problem Relation Age  of Onset   Diabetes Mother    Heart attack Neg Hx    SOCIAL HISTORY Social History   Tobacco Use   Smoking status: Never   Smokeless tobacco: Never  Vaping Use   Vaping status: Never Used  Substance Use Topics   Alcohol use: Not Currently   Drug use: Not Currently       OPHTHALMIC EXAM:  Base Eye Exam     Visual Acuity (Snellen - Linear)       Right Left   Dist [place] 20/80 +1 CF at 2'   Dist ph [place] 20/60 20/800         Tonometry (Tonopen, 9:41 AM)       Right Left   Pressure 15 17         Pupils       Dark Light Shape React APD   Right 3 2 Round Brisk None   Left 3 3 Round NR None         Visual Fields (Counting fingers)       Left Right     Full   Restrictions Partial outer superior temporal, inferior temporal, superior nasal, inferior nasal deficiencies          Extraocular Movement       Right Left    Full, Ortho Full, Ortho         Neuro/Psych     Oriented x3: Yes   Mood/Affect: Normal         Dilation     Both eyes: 1.0% Mydriacyl, 2.5% Phenylephrine @ 9:41 AM           Slit Lamp and Fundus Exam     External Exam       Right Left   External Normal Normal         Slit Lamp Exam       Right Left   Lids/Lashes Dermatochalasis - upper lid Dermatochalasis - upper lid   Conjunctiva/Sclera White and quiet nasal and temporal pinguecula, mild subconj oil cysts nasally   Cornea 1-2+ pigmented guttata, well healed cataract wound, trace tear film debris Trace PEE, mild arcus, well healed cataract wound, trace Guttata, focal corneal haze and irregular epi at 0800   Anterior Chamber Deep and quiet moderate  depth, narrow angles   Iris Round and dilated Round and dilated, No NVI   Lens PC IOL in good position, 1+posterior capsular opacification PC IOL in good position, 1+Posterior capsular opacification   Anterior Vitreous Vitreous syneresis, Posterior vitreous detachment, blood stained Vitreous condensations settling inferiorly  post vitrectomy, good silicone oil fill         Fundus Exam       Right Left   Disc Pink and sharp, no NVD 2+ Pallor, Sharp rim   C/D Ratio 0.5 0.3   Macula Flat, Blunted foveal reflex, scattered MA/DBH, +edema and exudates central and temporal macula flat under oil, scattered MA/DBH, +edema temporal macula and fovea -- slightly improved   Vessels attenuated, Tortuous attenuated, Tortuous   Periphery Attached, scattered MA/DBH, inferior retina obsured by [facility] / blood clots Inferonasal retina reattached under oil; 360 PRP laser changes, PRE-OP: 360 MA/DBH, focal tractional RD IN quad -- focal tractional fibrosis / NVE along IN arcades - SRF extends posteriorly to IN disc           IMAGING AND PROCEDURES  Imaging and Procedures for 01/10/2024  OCT, Retina - OU - Both Eyes       Right Eye Quality was good. Central Foveal Thickness: 296. Progression has improved. Findings include no SRF, abnormal foveal contour, epiretinal membrane, intraretinal fluid (interval improvement in IRF / IRHM ST fovea and macula, +vitreous opacities -- slightly improved).   Left Eye Quality was good. Central Foveal Thickness: 317. Progression has improved. Findings include no SRF, abnormal foveal contour, intraretinal hyper-reflective material (interval improvement in IRF / edema temporal and superior fovea and mac under silicone oil).   Notes *Images captured and stored on drive  Diagnosis / Impression:  OD: interval improvement in IRF / IRHM ST fovea and macula, +vitreous opacities -- slightly improved OS: interval improvement in IRF / edema temporal and superior fovea and mac under silicone oil  Clinical management:  See below  Abbreviations: NFP - Normal foveal profile. CME - cystoid macular edema. PED - pigment epithelial detachment. IRF - intraretinal fluid. SRF - subretinal fluid. EZ - ellipsoid zone. ERM - epiretinal membrane. ORA - outer retinal atrophy. ORT - outer retinal tubulation. SRHM -  subretinal hyper-reflective material. IRHM - intraretinal hyper-reflective material      Intravitreal Injection, Pharmacologic Agent - OD - Right Eye       Time Out 01/10/2024. 10:43 AM. Confirmed correct patient, procedure, site, and patient consented.   Anesthesia Topical anesthesia was used. Anesthetic medications included Lidocaine 2%, Proparacaine 0.5%.   Procedure Preparation included 5% betadine to ocular surface, eyelid speculum. A supplied (32g) needle was used.   Injection: 1.25 mg Bevacizumab 1.25mg /0.72ml   Route: Intravitreal, Site: Right Eye   NDC: P3213405, Lot: [id number] , Expiration date: 02/08/2024   Post-op Post injection exam found visual acuity of at least counting fingers. The patient tolerated the procedure well. There were no complications. The patient received written and verbal post procedure care education.      Intravitreal Injection, Pharmacologic Agent - OS - Left Eye       Time Out 01/10/2024. 10:43 AM. Confirmed correct patient, procedure, site, and patient consented.   Anesthesia Topical anesthesia was used. Anesthetic medications included Lidocaine 2%, Proparacaine 0.5%.   Procedure Preparation included 5% betadine to ocular surface, eyelid speculum. A supplied (32g) needle was used.   Injection: 1.25 mg Bevacizumab 1.25mg /0.12ml   Route: Intravitreal, Site: Left Eye   NDC: P3213405, Lot: [id number], Expiration date: 02/15/2024  Post-op Post injection exam found visual acuity of at least counting fingers. The patient tolerated the procedure well. There were no complications. The patient received written and verbal post procedure care education.            ASSESSMENT/PLAN:   ICD-10-CM   1. 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 Diabetes mellitus treated with oral medication (HCC)  E11.9    Z79.84     3. Encounter for long-term (current) use of insulin (HCC)  Z79.4     4. Diabetes mellitus treated with injections of non-insulin medication (HCC)  E11.9    Z79.85     5. Vitreous hemorrhage of both eyes (HCC)  H43.13     6. Retinal detachment, tractional, left eye  H33.42     7. Essential hypertension  I10     8. Hypertensive retinopathy of both eyes  H35.033     9. Pseudophakia, both eyes  Z96.1       1-5. 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 - BCVA OD 20/60 from 20/40; OS 20/800 from CF - exam shows interval improvement in diffuse VH OD; OS stably reattached under oil - OCT shows: OD: interval improvement in IRF / IRHM ST fovea and macula, +vitreous opacities -- slightly improved; OS: interval improvement in IRF / edema temporal and superior fovea and mac under silicone oil - recommend IVA OU #7 today, 04.11.25 for persistent DME w/ f/u in 4 wks again - pt wishes to proceed with injections - RBA of procedure discussed, questions answered - IVA informed consent obtained and signed, 10.16.24 (OU) - see procedure note - cont PF 4x/day OS -- pt is no longer using Brimonidine BID OS PSO ung QHS OS -- pt is no longer using - post op drop and  positioning instructions reviewed  - tylenol/ibuprofen for pain  - f/u 4 weeks, DFE, OCT  6. Traction retinal detachment, OS - focal traction RD IN quad--focal traction fibrosis along IN arcades, SRF extended IN disc - s/p PPV/MP/PFO/EL/SO OS, 10.24.2024 -- see above - retina reattached under oil - discussed possible silicon oil removal this spring or summer   7,8. Hypertensive retinopathy OU - discussed importance of tight BP control - monitor   9. Pseudophakia OU  - s/p CE/IOL OU (Dr. Zenaida Niece)  - IOLs in good position, doing well  - monitor   Ophthalmic Meds Ordered this visit:  Meds ordered this encounter  Medications   Bevacizumab (AVASTIN) SOLN 1.25 mg   Bevacizumab (AVASTIN) SOLN 1.25 mg     Return in about 4 weeks (around  02/07/2024) for f/u PDR OU, DFE, OCT, Possible Injxn.  This document serves as a record of services personally performed by Karie Chimera, MD, PhD. It was created on their behalf by Berlin Hun COT, an ophthalmic technician. The creation of this record is the provider's dictation and/or activities during the visit.    Electronically signed by: Berlin Hun COT 04.10.25 11:59 AM  This document serves as a record of services personally performed by Karie Chimera, MD, PhD. It was created on their behalf by Glee Arvin. Manson Passey, OA an ophthalmic technician. The creation of this record is the provider's dictation and/or activities during the visit.    Electronically signed by: Glee Arvin. Manson Passey, OA 01/10/24 11:59 AM  Karie Chimera, M.D., Ph.D. Diseases & Surgery of the Retina and Vitreous [facility] 01/10/2024   I have reviewed the above documentation for accuracy and completeness, and I agree with the above. Karie Chimera, M.D., Ph.D. 01/10/24 12:01 PM   Abbreviations: M myopia (nearsighted); A astigmatism; H hyperopia (farsighted); P presbyopia; Mrx spectacle prescription;  CTL contact lenses; OD right eye; OS left  eye; OU both eyes  XT exotropia; ET esotropia; PEK punctate epithelial keratitis; PEE punctate epithelial erosions; DES dry eye syndrome; MGD meibomian gland dysfunction; ATs artificial tears; PFAT's preservative free artificial tears; NSC nuclear sclerotic cataract; PSC posterior subcapsular cataract; ERM epi-retinal membrane; PVD posterior vitreous detachment; RD retinal detachment; DM diabetes mellitus; DR diabetic retinopathy; NPDR non-proliferative diabetic retinopathy; PDR proliferative diabetic retinopathy; CSME clinically significant macular edema; DME diabetic macular edema; dbh dot blot hemorrhages; CWS cotton wool spot; POAG primary open angle glaucoma; C/D cup-to-disc ratio; HVF humphrey visual field; GVF goldmann visual field; OCT optical coherence tomography; IOP intraocular pressure; BRVO Branch retinal vein occlusion; CRVO central retinal vein occlusion; CRAO central retinal artery occlusion; BRAO branch retinal artery occlusion; RT retinal tear; SB scleral buckle; PPV pars plana vitrectomy; VH Vitreous hemorrhage; PRP panretinal laser photocoagulation; IVK intravitreal kenalog; VMT vitreomacular traction; MH Macular hole;  NVD neovascularization of the disc; NVE neovascularization elsewhere; AREDS age related eye disease study; ARMD age related macular degeneration; POAG primary open angle glaucoma; EBMD epithelial/anterior basement membrane dystrophy; ACIOL anterior chamber intraocular lens; IOL intraocular lens; PCIOL posterior chamber intraocular lens; Phaco/IOL phacoemulsification with intraocular lens placement; PRK photorefractive keratectomy; LASIK laser assisted in situ keratomileusis; HTN hypertension; DM diabetes mellitus; COPD chronic obstructive pulmonary disease

## 2024-01-10 ENCOUNTER — Ambulatory Visit (INDEPENDENT_AMBULATORY_CARE_PROVIDER_SITE_OTHER): Admitting: Ophthalmology

## 2024-01-10 ENCOUNTER — Encounter (INDEPENDENT_AMBULATORY_CARE_PROVIDER_SITE_OTHER): Payer: Self-pay | Admitting: Ophthalmology

## 2024-01-10 DIAGNOSIS — Z794 Long term (current) use of insulin: Secondary | ICD-10-CM | POA: Diagnosis not present

## 2024-01-10 DIAGNOSIS — E113513 Type 2 diabetes mellitus with proliferative diabetic retinopathy with macular edema, bilateral: Secondary | ICD-10-CM | POA: Diagnosis not present

## 2024-01-10 DIAGNOSIS — E119 Type 2 diabetes mellitus without complications: Secondary | ICD-10-CM

## 2024-01-10 DIAGNOSIS — Z7985 Long-term (current) use of injectable non-insulin antidiabetic drugs: Secondary | ICD-10-CM | POA: Diagnosis not present

## 2024-01-10 DIAGNOSIS — H3342 Traction detachment of retina, left eye: Secondary | ICD-10-CM

## 2024-01-10 DIAGNOSIS — I10 Essential (primary) hypertension: Secondary | ICD-10-CM

## 2024-01-10 DIAGNOSIS — Z961 Presence of intraocular lens: Secondary | ICD-10-CM

## 2024-01-10 DIAGNOSIS — H4313 Vitreous hemorrhage, bilateral: Secondary | ICD-10-CM

## 2024-01-10 DIAGNOSIS — Z7984 Long term (current) use of oral hypoglycemic drugs: Secondary | ICD-10-CM | POA: Diagnosis not present

## 2024-01-10 DIAGNOSIS — H35033 Hypertensive retinopathy, bilateral: Secondary | ICD-10-CM

## 2024-01-10 MED ORDER — BEVACIZUMAB CHEMO INJECTION 1.25MG/0.05ML SYRINGE FOR KALEIDOSCOPE
1.2500 mg | INTRAVITREAL | Status: AC | PRN
  Administered 2024-01-10: 1.25 mg via INTRAVITREAL

## 2024-02-04 ENCOUNTER — Ambulatory Visit: Payer: Medicare Other

## 2024-02-04 ENCOUNTER — Ambulatory Visit (HOSPITAL_COMMUNITY): Payer: Medicare Other | Attending: Vascular Surgery

## 2024-02-06 NOTE — Progress Notes (Signed)
 [facility] - Clinic Note  02/07/2024   CHIEF COMPLAINT Patient presents for Retina Follow Up  HISTORY OF PRESENT ILLNESS: Brett White is a 63 y.o. male who presents to the clinic today for:  HPI     Retina Follow Up   Patient presents with  Diabetic Retinopathy.  In both eyes.  This started 4 weeks ago.  I, the attending physician,  performed the HPI with the patient and updated documentation appropriately.        Comments   Patient here for 4 weeks retina follow up for PDR OU. Patient states vision OD great. OS can't see nothing. Like looking through water . OS has pain around it. Using drops and ung. Don't think they are doing any good.       Last edited by Ronelle Coffee, MD on 02/07/2024 12:38 PM.     Pt states his left eye is "useless", but his right eye is fine   Referring physician: Florance Hun, MD [address] [place],  [place] [address]  HISTORICAL INFORMATION:  Selected notes from the MEDICAL RECORD NUMBER Referred by Dr. Carloyn Chi for PDR w/ [facility] OU LEE:  Ocular Hx- PMH-   CURRENT MEDICATIONS: Current Outpatient Medications (Ophthalmic Drugs)  Medication Sig   bacitracin -polymyxin b  (POLYSPORIN ) ophthalmic ointment Place into the right eye 4 (four) times daily. Place a 1/2 inch ribbon of ointment into the lower eyelid 4 a day and as needed for discomfort   brimonidine  (ALPHAGAN ) 0.2 % ophthalmic solution Place 1 drop into the left eye in the morning and at bedtime.   carboxymethylcellulose 1 % ophthalmic solution Place 4 drops into the left eye in the morning, at noon, in the evening, and at bedtime.   ketorolac  (ACULAR ) 0.4 % SOLN Place 1 drop into the left eye 4 (four) times daily.   moxifloxacin (VIGAMOX) 0.5 % ophthalmic solution Place 1 drop into the left eye in the morning and at bedtime. For 7 days   prednisoLONE  acetate (PRED FORTE ) 1 % ophthalmic suspension Place 1 drop into the left eye 4 (four) times daily.   No current  facility-administered medications for this visit. (Ophthalmic Drugs)   Current Outpatient Medications (Other)  Medication Sig   acetaminophen  (TYLENOL ) 325 MG tablet Take 2 tablets (650 mg total) by mouth every 6 (six) hours as needed for mild pain (or Fever >/= 101).   amLODipine  (NORVASC ) 10 MG tablet Take 10 mg by mouth every morning.   atorvastatin  (LIPITOR) 40 MG tablet Take 40 mg by mouth at bedtime.   bisacodyl  (DULCOLAX) 5 MG EC tablet Take 10 mg by mouth daily as needed (CONSTIPATION).   cyclobenzaprine  (FLEXERIL ) 5 MG tablet Take 1 tablet (5 mg total) by mouth 3 (three) times daily as needed for muscle spasms.   DULoxetine  (CYMBALTA ) 60 MG capsule Take 60 mg by mouth daily.   ergocalciferol  (VITAMIN D2) 1.25 MG (50000 UT) capsule Take 50,000 Units by mouth once a week. Fridays   famotidine  (PEPCID ) 20 MG tablet Take 20 mg by mouth daily.   FARXIGA  10 MG TABS tablet Take 10 mg by mouth every morning. Dapaglifozin   furosemide  (LASIX ) 20 MG tablet Take 20 mg by mouth every morning.   glipiZIDE  (GLUCOTROL ) 10 MG tablet Take 10 mg by mouth every morning.   guaiFENesin  (ROBITUSSIN) 100 MG/5ML liquid Take 10 mLs by mouth every 4 (four) hours as needed for cough or to loosen phlegm.   insulin  lispro (HUMALOG) 100 UNIT/ML KwikPen  Inject 2-8 Units into the skin 3 (three) times daily with meals. Sliding scale 201-250=2 units 251-300=4 units 301-350=6 units 351-400= 8 units 401 or greater than=call [facility]   ipratropium-albuterol  (DUONEB) 0.5-2.5 (3) MG/3ML SOLN Take 3 mLs by nebulization every 4 (four) hours as needed.   LINZESS  145 MCG CAPS capsule Take 145 mcg by mouth daily.   losartan  (COZAAR ) 50 MG tablet Take 1 tablet (50 mg total) by mouth daily.   magnesium  hydroxide (MILK OF MAGNESIA) 400 MG/5ML suspension Take 30 mLs by mouth daily as needed for mild constipation.   Melatonin 5 MG CAPS Take 10 mg by mouth at bedtime.   naloxone  (NARCAN ) nasal spray 4 mg/0.1 mL Place 1 spray into  the nose See admin instructions. Every 2-3 minutesas needed until patient response/ emba arrived.   oxyCODONE -acetaminophen  (PERCOCET) 10-325 MG tablet Take 1 tablet by mouth every 4 (four) hours as needed for pain.   pantoprazole  (PROTONIX ) 40 MG tablet Take 1 tablet (40 mg total) by mouth daily.   polyethylene glycol (MIRALAX  / GLYCOLAX ) 17 g packet Take 17 g by mouth daily. (Patient taking differently: Take 17 g by mouth daily. With 4 oz of fluids and drink)   pregabalin  (LYRICA ) 100 MG capsule Take 1 capsule (100 mg total) by mouth 2 (two) times daily. (Patient taking differently: Take 100 mg by mouth 4 (four) times daily.)   sertraline  (ZOLOFT ) 50 MG tablet Take 50 mg by mouth daily.   SODIUM PHOSPHATES RE Place 1 application  rectally daily. As needed constipation 3 of 4   traZODone  (DESYREL ) 150 MG tablet Take 75 mg by mouth at bedtime.   TRULICITY 1.5 MG/0.5ML SOPN Inject 0.5 mg into the skin once a week. Thursdays   hydrocortisone cream 1 % Apply 1 Application topically daily. Apply to right lower leg for wound care (Patient not taking: Reported on 11/20/2023)   hydrOXYzine  (ATARAX ) 10 MG tablet Take 1 tablet (10 mg total) by mouth 3 (three) times daily as needed for itching. (Patient not taking: Reported on 11/20/2023)   Polyethylene Glycol 3350  POWD Take 17 g by mouth every morning. Mix with 4 oz of fluids (Patient not taking: Reported on 11/20/2023)   No current facility-administered medications for this visit. (Other)   REVIEW OF SYSTEMS: ROS   Positive for: Musculoskeletal, Endocrine, Cardiovascular, Eyes Negative for: Constitutional, Gastrointestinal, Neurological, Skin, Genitourinary, HENT, Respiratory, Psychiatric, Allergic/Imm, Heme/Lymph Last edited by Sylvan Evener, COA on 02/07/2024  9:13 AM.      ALLERGIES No Known Allergies  PAST MEDICAL HISTORY Past Medical History:  Diagnosis Date   CAP (community acquired pneumonia) 12/09/2021   CKD (chronic kidney disease)     GERD (gastroesophageal reflux disease)    HTN (hypertension)    Insulin  dependent type 2 diabetes mellitus (HCC)    Neuropathy    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   Stroke [facility])    Past Surgical History:  Procedure Laterality Date   ABDOMINAL AORTOGRAM W/LOWER EXTREMITY N/A 06/20/2023   Procedure: ABDOMINAL AORTOGRAM W/LOWER EXTREMITY;  Surgeon: Young Hensen, MD;  Location: [facility] INVASIVE CV LAB;  Service: Cardiovascular;  Laterality: N/A;   AMPUTATION Left 02/04/2021   Procedure: LEFT GREAT TOE AMPUTATION;  Surgeon: Timothy Ford, MD;  Location: [facility] OR;  Service: Orthopedics;  Laterality: Left;   AMPUTATION Left 02/10/2021   Procedure: LEFT FOOT 1ST RAY AMPUTATION;  Surgeon: Timothy Ford, MD;  Location: [facility] OR;  Service: Orthopedics;  Laterality: Left;  AMPUTATION Left 03/22/2021   Procedure: LEFT BELOW KNEE AMPUTATION;  Surgeon: Timothy Ford, MD;  Location: [facility] OR;  Service: Orthopedics;  Laterality: Left;   APPENDECTOMY     BACK SURGERY     CATARACT EXTRACTION     I & D EXTREMITY Left 03/03/2021   Procedure: LEFT FOOT DEBRIDEMENT;  Surgeon: Timothy Ford, MD;  Location: [facility] OR;  Service: Orthopedics;  Laterality: Left;   INCISION AND DRAINAGE ABSCESS N/A 10/06/2021   Procedure: INCISION AND DRAINAGE BACK ABSCESS;  Surgeon: Adalberto Acton, MD;  Location: [facility] OR;  Service: General;  Laterality: N/A;   INJECTION OF SILICONE OIL Left 07/25/2023   Procedure: INJECTION OF SILICONE OIL;  Surgeon: Ronelle Coffee, MD;  Location: [facility] OR;  Service: Ophthalmology;  Laterality: Left;   MEMBRANE PEEL Left 07/25/2023   Procedure: MEMBRANE PEEL;  Surgeon: Ronelle Coffee, MD;  Location: [facility] OR;  Service: Ophthalmology;  Laterality: Left;   PARS PLANA VITRECTOMY Left 07/25/2023   Procedure: PARS PLANA VITRECTOMY WITH 25 GAUGE;  Surgeon: Ronelle Coffee, MD;  Location: [facility] OR;  Service: Ophthalmology;  Laterality: Left;   removal of back cyst     TONSILLECTOMY     FAMILY  HISTORY Family History  Problem Relation Age of Onset   Diabetes Mother    Heart attack Neg Hx    SOCIAL HISTORY Social History   Tobacco Use   Smoking status: Never   Smokeless tobacco: Never  Vaping Use   Vaping status: Never Used  Substance Use Topics   Alcohol  use: Not Currently   Drug use: Not Currently       OPHTHALMIC EXAM:  Base Eye Exam     Visual Acuity (Snellen - Linear)       Right Left   Dist [place] 20/150 +1 CF at 2'   Dist ph  20/100 -2 NI         Tonometry (Tonopen, 9:09 AM)       Right Left   Pressure 14 17         Pupils       Dark Light Shape React APD   Right 3 2 Round Brisk None   Left 3 3 Round NR None         Visual Fields (Counting fingers)       Left Right   Restrictions Partial outer superior temporal, inferior temporal, superior nasal, inferior nasal deficiencies          Extraocular Movement       Right Left    Full, Ortho Full, Ortho         Neuro/Psych     Oriented x3: Yes   Mood/Affect: Normal         Dilation     Both eyes: 1.0% Mydriacyl , 2.5% Phenylephrine  @ 9:09 AM           Slit Lamp and Fundus Exam     External Exam       Right Left   External Normal Normal         Slit Lamp Exam       Right Left   Lids/Lashes Dermatochalasis - upper lid Dermatochalasis - upper lid   Conjunctiva/Sclera White and quiet nasal and temporal pinguecula, mild subconj oil cysts nasally   Cornea 1-2+ pigmented guttata, well healed cataract wound, trace tear film debris Trace PEE, mild arcus, well healed cataract wound, trace Guttata, focal corneal haze and irregular epi at 0800   Anterior Chamber Deep  and quiet moderate depth, narrow angles   Iris Round and dilated Round and dilated, No NVI   Lens PC IOL in good position, 1+posterior capsular opacification PC IOL in good position, 1+Posterior capsular opacification   Anterior Vitreous Vitreous syneresis, Posterior vitreous detachment, blood stained  Vitreous condensations settling inferiorly post vitrectomy, good silicone oil fill         Fundus Exam       Right Left   Disc Pink and sharp, no NVD 2+ Pallor, Sharp rim   C/D Ratio 0.5 0.3   Macula Flat, Blunted foveal reflex, scattered MA/DBH, +edema and exudates central and temporal macula -- improving flat under oil, scattered MA/DBH, +edema temporal macula and fovea -- slightly increased   Vessels attenuated, Tortuous attenuated, Tortuous   Periphery Attached, scattered MA/DBH, inferior retina obsured by [facility] / blood clots Inferonasal retina reattached under oil; 360 PRP laser changes, PRE-OP: 360 MA/DBH, focal tractional RD IN quad -- focal tractional fibrosis / NVE along IN arcades - SRF extends posteriorly to IN disc           IMAGING AND PROCEDURES  Imaging and Procedures for 02/07/2024  OCT, Retina - OU - Both Eyes        Right Eye Quality was good. Central Foveal Thickness: 293. Progression has improved. Findings include no SRF, abnormal foveal contour, epiretinal membrane, intraretinal fluid (interval improvement in IRF / IRHM ST fovea and macula, +vitreous opacities -- slightly improved).   Left Eye Quality was good. Central Foveal Thickness: 352. Progression has worsened. Findings include no SRF, abnormal foveal contour, intraretinal hyper-reflective material (interval increase in IRF / edema temporal and superior fovea and mac under silicone oil).   Notes  *Images captured and stored on drive  Diagnosis / Impression:  OD: interval improvement in IRF / IRHM ST fovea and macula, +vitreous opacities -- slightly improved OS: interval increase in IRF / edema temporal and superior fovea and mac under silicone oil  Clinical management:  See below  Abbreviations: NFP - Normal foveal profile. CME - cystoid macular edema. PED - pigment epithelial detachment. IRF - intraretinal fluid. SRF - subretinal fluid. EZ - ellipsoid zone. ERM - epiretinal membrane. ORA - outer  retinal atrophy. ORT - outer retinal tubulation. SRHM - subretinal hyper-reflective material. IRHM - intraretinal hyper-reflective material      Intravitreal Injection, Pharmacologic Agent - OD - Right Eye       Time Out 02/07/2024. 10:11 AM. Confirmed correct patient, procedure, site, and patient consented.   Anesthesia Topical anesthesia was used. Anesthetic medications included Lidocaine  2%, Proparacaine  0.5%.   Procedure Preparation included 5% betadine to ocular surface, eyelid speculum. A supplied (32g) needle was used.   Injection: 1.25 mg Bevacizumab  1.25mg /0.29ml   Route: Intravitreal, Site: Right Eye   NDC: [id number], Lot: [id number], Expiration date: 02/27/2024   Post-op Post injection exam found visual acuity of at least counting fingers. The patient tolerated the procedure well. There were no complications. The patient received written and verbal post procedure care education.      Intravitreal Injection, Pharmacologic Agent - OS - Left Eye       Time Out 02/07/2024. 10:11 AM. Confirmed correct patient, procedure, site, and patient consented.   Anesthesia Topical anesthesia was used. Anesthetic medications included Lidocaine  2%, Proparacaine  0.5%.   Procedure Preparation included 5% betadine to ocular surface, eyelid speculum. A (32g) needle was used.   Injection: 1.25 mg Bevacizumab  1.25mg /0.19ml   Route: Intravitreal, Site: Left Eye   NDC:  40981-191-47, Lot: [id number], Expiration date: 05/05/2024   Post-op Post injection exam found visual acuity of at least counting fingers. The patient tolerated the procedure well. There were no complications. The patient received written and verbal post procedure care education.           ASSESSMENT/PLAN:   ICD-10-CM   1. Proliferative diabetic retinopathy of both eyes with macular edema associated with type 2 diabetes mellitus (HCC)  E11.3513 OCT, Retina - OU - Both Eyes    Intravitreal Injection, Pharmacologic Agent - OD -  Right Eye    Intravitreal Injection, Pharmacologic Agent - OS - Left Eye    Bevacizumab  (AVASTIN ) SOLN 1.25 mg    Bevacizumab  (AVASTIN ) SOLN 1.25 mg    2. Diabetes mellitus treated with oral medication (HCC)  E11.9    Z79.84     3. Encounter for long-term (current) use of insulin  (HCC)  Z79.4     4. Diabetes mellitus treated with injections of non-insulin  medication (HCC)  E11.9    Z79.85     5. Vitreous hemorrhage of both eyes (HCC)  H43.13     6. Retinal detachment, tractional, left eye  H33.42     7. Essential hypertension  I10     8. Hypertensive retinopathy of both eyes  H35.033     9. Pseudophakia, both eyes  Z96.1      1-5. 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 - BCVA OD 20/100 from 20/60; OS CF from 20/800 - exam shows interval improvement in diffuse VH OD; OS stably reattached under oil - OCT shows: OD: interval improvement in IRF / IRHM ST fovea and macula, +vitreous opacities -- slightly improved; OS: interval improvement in IRF / edema temporal and superior fovea and mac under silicone oil **discussed decreased efficacy / resistance to Avastin  and potential benefit of switching from Avastin  to Eylea** - recommend IVA OU #8 today, 05.09 25 for persistent DME w/ f/u in 4 wks again - pt wishes to proceed with injections - RBA of procedure discussed, questions answered - IVA informed consent obtained and signed,  10.16.24 (OU) - see procedure note - cont Brimonidine  BID OS - post op drop and positioning instructions reviewed  - tylenol /ibuprofen  for pain  - will check Eylea authorization for next visit - f/u 4 weeks, DFE, OCT  6. Traction retinal detachment, OS - focal traction RD IN quad--focal traction fibrosis along IN arcades, SRF extended IN disc - s/p PPV/MP/PFO/EL/SO OS, 10.24.2024 -- see above - retina reattached under oil - discussed possible silicon oil removal this spring or summer   7,8. Hypertensive retinopathy OU - discussed importance of tight BP control - monitor   9. Pseudophakia OU  - s/p CE/IOL OU (Dr. Carloyn Chi)  - IOLs in good position, doing well  - monitor   Ophthalmic Meds Ordered this visit:  Meds ordered this encounter  Medications   Bevacizumab  (AVASTIN ) SOLN 1.25 mg   Bevacizumab  (  AVASTIN ) SOLN 1.25 mg     Return in about 4 weeks (around 03/06/2024) for f/u PDR OU, DFE, OCT, Possible Injxn.  This document serves as a record of services personally performed by Jeanice Millard, MD, PhD. It was created on their behalf by Eller Gut COT, an ophthalmic technician. The creation of this record is the provider's dictation and/or activities during the visit.    Electronically signed by: Eller Gut COT 05.08.25 11:18 PM  This document serves as a record of services personally performed by Jeanice Millard, MD, PhD. It was created on their behalf by Morley Arabia. Bevin Bucks, OA an ophthalmic technician. The creation of this record is the provider's dictation and/or activities during the visit.    Electronically signed by: Morley Arabia. Bevin Bucks, OA 02/09/24 11:18 PM  Jeanice Millard, M.D., Ph.D. Diseases & Surgery of the Retina and Vitreous [facility] 02/07/2024   I have reviewed the above documentation for accuracy and completeness, and I agree with the above. Jeanice Millard, M.D., Ph.D. 02/09/24 11:19 PM  Abbreviations: M myopia  (nearsighted); A astigmatism; H hyperopia (farsighted); P presbyopia; Mrx spectacle prescription;  CTL contact lenses; OD right eye; OS left eye; OU both eyes  XT exotropia; ET esotropia; PEK punctate epithelial keratitis; PEE punctate epithelial erosions; DES dry eye syndrome; MGD meibomian gland dysfunction; ATs artificial tears; PFAT's preservative free artificial tears; NSC nuclear sclerotic cataract; PSC posterior subcapsular cataract; ERM epi-retinal membrane; PVD posterior vitreous detachment; RD retinal detachment; DM diabetes mellitus; DR diabetic retinopathy; NPDR non-proliferative diabetic retinopathy; PDR proliferative diabetic retinopathy; CSME clinically significant macular edema; DME diabetic macular edema; dbh dot blot hemorrhages; CWS cotton wool spot; POAG primary open angle glaucoma; C/D cup-to-disc ratio; HVF humphrey visual field; GVF goldmann visual field; OCT optical coherence tomography; IOP intraocular pressure; BRVO Branch retinal vein occlusion; CRVO central retinal vein occlusion; CRAO central retinal artery occlusion; BRAO branch retinal artery occlusion; RT retinal tear; SB scleral buckle; PPV pars plana vitrectomy; VH Vitreous hemorrhage; PRP panretinal laser photocoagulation; IVK intravitreal kenalog ; VMT vitreomacular traction; MH Macular hole;  NVD neovascularization of the disc; NVE neovascularization elsewhere; AREDS age related eye disease study; ARMD age related macular degeneration; POAG primary open angle glaucoma; EBMD epithelial/anterior basement membrane dystrophy; ACIOL anterior chamber intraocular lens; IOL intraocular lens; PCIOL posterior chamber intraocular lens; Phaco/IOL phacoemulsification with intraocular lens placement; PRK photorefractive keratectomy; LASIK laser assisted in situ keratomileusis; HTN hypertension; DM diabetes mellitus; COPD chronic obstructive pulmonary disease

## 2024-02-07 ENCOUNTER — Encounter (INDEPENDENT_AMBULATORY_CARE_PROVIDER_SITE_OTHER): Payer: Self-pay | Admitting: Ophthalmology

## 2024-02-07 ENCOUNTER — Ambulatory Visit (INDEPENDENT_AMBULATORY_CARE_PROVIDER_SITE_OTHER): Admitting: Ophthalmology

## 2024-02-07 DIAGNOSIS — I10 Essential (primary) hypertension: Secondary | ICD-10-CM

## 2024-02-07 DIAGNOSIS — Z7985 Long-term (current) use of injectable non-insulin antidiabetic drugs: Secondary | ICD-10-CM

## 2024-02-07 DIAGNOSIS — Z7984 Long term (current) use of oral hypoglycemic drugs: Secondary | ICD-10-CM

## 2024-02-07 DIAGNOSIS — H3342 Traction detachment of retina, left eye: Secondary | ICD-10-CM

## 2024-02-07 DIAGNOSIS — Z794 Long term (current) use of insulin: Secondary | ICD-10-CM

## 2024-02-07 DIAGNOSIS — E113513 Type 2 diabetes mellitus with proliferative diabetic retinopathy with macular edema, bilateral: Secondary | ICD-10-CM | POA: Diagnosis not present

## 2024-02-07 DIAGNOSIS — E119 Type 2 diabetes mellitus without complications: Secondary | ICD-10-CM

## 2024-02-07 DIAGNOSIS — H4313 Vitreous hemorrhage, bilateral: Secondary | ICD-10-CM

## 2024-02-07 DIAGNOSIS — Z961 Presence of intraocular lens: Secondary | ICD-10-CM

## 2024-02-07 DIAGNOSIS — H35033 Hypertensive retinopathy, bilateral: Secondary | ICD-10-CM

## 2024-02-07 MED ORDER — BEVACIZUMAB CHEMO INJECTION 1.25MG/0.05ML SYRINGE FOR KALEIDOSCOPE
1.2500 mg | INTRAVITREAL | Status: AC | PRN
  Administered 2024-02-07: 1.25 mg via INTRAVITREAL

## 2024-03-05 NOTE — Progress Notes (Signed)
 [facility] - Clinic Note  03/06/2024   CHIEF COMPLAINT Patient presents for Retina Follow Up  HISTORY OF PRESENT ILLNESS: Brett White is a 63 y.o. male who presents to the clinic today for:  HPI     Retina Follow Up   Patient presents with  Diabetic Retinopathy.  In both eyes.  This started 4 weeks ago.  I, the attending physician,  performed the HPI with the patient and updated documentation appropriately.        Comments   Patient here for 4 weeks retina follow up for PDR OU. Patient states vision OD is great. OS is 100% shot. Drops and creams don't help. Hurts, throbs runs a lot.      Last edited by Ronelle Coffee, MD on 03/06/2024 11:15 AM.    Pt states his right eye is good, his left eye is "screwed up", he states none of the drops or ointments help   Referring physician: Florance Hun, MD [address] [place],  [place] [address]  HISTORICAL INFORMATION:  Selected notes from the MEDICAL RECORD NUMBER Referred by Dr. Carloyn Chi for PDR w/ [facility] OU LEE:  Ocular Hx- PMH-   CURRENT MEDICATIONS: Current Outpatient Medications (Ophthalmic Drugs)  Medication Sig   bacitracin -polymyxin b  (POLYSPORIN ) ophthalmic ointment Place into the right eye 4 (four) times daily. Place a 1/2 inch ribbon of ointment into the lower eyelid 4 a day and as needed for discomfort   brimonidine  (ALPHAGAN ) 0.2 % ophthalmic solution Place 1 drop into the left eye in the morning and at bedtime.   carboxymethylcellulose 1 % ophthalmic solution Place 4 drops into the left eye in the morning, at noon, in the evening, and at bedtime.   ketorolac  (ACULAR ) 0.4 % SOLN Place 1 drop into the left eye 4 (four) times daily.   moxifloxacin (VIGAMOX) 0.5 % ophthalmic solution Place 1 drop into the left eye in the morning and at bedtime. For 7 days   prednisoLONE  acetate (PRED FORTE ) 1 % ophthalmic suspension Place 1 drop into the left eye 4 (four) times daily.   No current facility-administered  medications for this visit. (Ophthalmic Drugs)   Current Outpatient Medications (Other)  Medication Sig   acetaminophen  (TYLENOL ) 325 MG tablet Take 2 tablets (650 mg total) by mouth every 6 (six) hours as needed for mild pain (or Fever >/= 101).   amLODipine  (NORVASC ) 10 MG tablet Take 10 mg by mouth every morning.   atorvastatin  (LIPITOR) 40 MG tablet Take 40 mg by mouth at bedtime.   bisacodyl  (DULCOLAX) 5 MG EC tablet Take 10 mg by mouth daily as needed (CONSTIPATION).   cyclobenzaprine  (FLEXERIL ) 5 MG tablet Take 1 tablet (5 mg total) by mouth 3 (three) times daily as needed for muscle spasms.   DULoxetine  (CYMBALTA ) 60 MG capsule Take 60 mg by mouth daily.   ergocalciferol  (VITAMIN D2) 1.25 MG (50000 UT) capsule Take 50,000 Units by mouth once a week. Fridays   famotidine  (PEPCID ) 20 MG tablet Take 20 mg by mouth daily.   FARXIGA  10 MG TABS tablet Take 10 mg by mouth every morning. Dapaglifozin   furosemide  (LASIX ) 20 MG tablet Take 20 mg by mouth every morning.   glipiZIDE  (GLUCOTROL ) 10 MG tablet Take 10 mg by mouth every morning.   guaiFENesin  (ROBITUSSIN) 100 MG/5ML liquid Take 10 mLs by mouth every 4 (four) hours as needed for cough or to loosen phlegm.   insulin  lispro (HUMALOG) 100 UNIT/ML KwikPen Inject 2-8  Units into the skin 3 (three) times daily with meals. Sliding scale 201-250=2 units 251-300=4 units 301-350=6 units 351-400= 8 units 401 or greater than=call [facility]   ipratropium-albuterol  (DUONEB) 0.5-2.5 (3) MG/3ML SOLN Take 3 mLs by nebulization every 4 (four) hours as needed.   LINZESS  145 MCG CAPS capsule Take 145 mcg by mouth daily.   losartan  (COZAAR ) 50 MG tablet Take 1 tablet (50 mg total) by mouth daily.   magnesium  hydroxide (MILK OF MAGNESIA) 400 MG/5ML suspension Take 30 mLs by mouth daily as needed for mild constipation.   Melatonin 5 MG CAPS Take 10 mg by mouth at bedtime.   naloxone  (NARCAN ) nasal spray 4 mg/0.1 mL Place 1 spray into the nose See admin  instructions. Every 2-3 minutesas needed until patient response/ emba arrived.   oxyCODONE -acetaminophen  (PERCOCET) 10-325 MG tablet Take 1 tablet by mouth every 4 (four) hours as needed for pain.   pantoprazole  (PROTONIX ) 40 MG tablet Take 1 tablet (40 mg total) by mouth daily.   polyethylene glycol (MIRALAX  / GLYCOLAX ) 17 g packet Take 17 g by mouth daily. (Patient taking differently: Take 17 g by mouth daily. With 4 oz of fluids and drink)   pregabalin  (LYRICA ) 100 MG capsule Take 1 capsule (100 mg total) by mouth 2 (two) times daily. (Patient taking differently: Take 100 mg by mouth 4 (four) times daily.)   sertraline  (ZOLOFT ) 50 MG tablet Take 50 mg by mouth daily.   SODIUM PHOSPHATES RE Place 1 application  rectally daily. As needed constipation 3 of 4   traZODone  (DESYREL ) 150 MG tablet Take 75 mg by mouth at bedtime.   TRULICITY 1.5 MG/0.5ML SOPN Inject 0.5 mg into the skin once a week. Thursdays   hydrocortisone cream 1 % Apply 1 Application topically daily. Apply to right lower leg for wound care (Patient not taking: Reported on 11/20/2023)   hydrOXYzine  (ATARAX ) 10 MG tablet Take 1 tablet (10 mg total) by mouth 3 (three) times daily as needed for itching. (Patient not taking: Reported on 11/20/2023)   Polyethylene Glycol 3350  POWD Take 17 g by mouth every morning. Mix with 4 oz of fluids (Patient not taking: Reported on 11/20/2023)   No current facility-administered medications for this visit. (Other)   REVIEW OF SYSTEMS: ROS   Positive for: Musculoskeletal, Endocrine, Cardiovascular, Eyes Negative for: Constitutional, Gastrointestinal, Neurological, Skin, Genitourinary, HENT, Respiratory, Psychiatric, Allergic/Imm, Heme/Lymph Last edited by Sylvan Evener, COA on 03/06/2024  9:21 AM.     ALLERGIES No Known Allergies  PAST MEDICAL HISTORY Past Medical History:  Diagnosis Date   CAP (community acquired pneumonia) 12/09/2021   CKD (chronic kidney disease)    GERD  (gastroesophageal reflux disease)    HTN (hypertension)    Insulin  dependent type 2 diabetes mellitus (HCC)    Neuropathy    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   Stroke [facility])    Past Surgical History:  Procedure Laterality Date   ABDOMINAL AORTOGRAM W/LOWER EXTREMITY N/A 06/20/2023   Procedure: ABDOMINAL AORTOGRAM W/LOWER EXTREMITY;  Surgeon: Young Hensen, MD;  Location: [facility] INVASIVE CV LAB;  Service: Cardiovascular;  Laterality: N/A;   AMPUTATION Left 02/04/2021   Procedure: LEFT GREAT TOE AMPUTATION;  Surgeon: Timothy Ford, MD;  Location: [facility] OR;  Service: Orthopedics;  Laterality: Left;   AMPUTATION Left 02/10/2021   Procedure: LEFT FOOT 1ST RAY AMPUTATION;  Surgeon: Timothy Ford, MD;  Location: [facility] OR;  Service: Orthopedics;  Laterality: Left;   AMPUTATION Left  03/22/2021   Procedure: LEFT BELOW KNEE AMPUTATION;  Surgeon: Timothy Ford, MD;  Location: [facility] OR;  Service: Orthopedics;  Laterality: Left;   APPENDECTOMY     BACK SURGERY     CATARACT EXTRACTION     I & D EXTREMITY Left 03/03/2021   Procedure: LEFT FOOT DEBRIDEMENT;  Surgeon: Timothy Ford, MD;  Location: [facility] OR;  Service: Orthopedics;  Laterality: Left;   INCISION AND DRAINAGE ABSCESS N/A 10/06/2021   Procedure: INCISION AND DRAINAGE BACK ABSCESS;  Surgeon: Adalberto Acton, MD;  Location: [facility] OR;  Service: General;  Laterality: N/A;   INJECTION OF SILICONE OIL Left 07/25/2023   Procedure: INJECTION OF SILICONE OIL;  Surgeon: Ronelle Coffee, MD;  Location: [facility] OR;  Service: Ophthalmology;  Laterality: Left;   MEMBRANE PEEL Left 07/25/2023   Procedure: MEMBRANE PEEL;  Surgeon: Ronelle Coffee, MD;  Location: [facility] OR;  Service: Ophthalmology;  Laterality: Left;   PARS PLANA VITRECTOMY Left 07/25/2023   Procedure: PARS PLANA VITRECTOMY WITH 25 GAUGE;  Surgeon: Ronelle Coffee, MD;  Location: [facility] OR;  Service: Ophthalmology;  Laterality: Left;   removal of back cyst     TONSILLECTOMY     FAMILY  HISTORY Family History  Problem Relation Age of Onset   Diabetes Mother    Heart attack Neg Hx    SOCIAL HISTORY Social History   Tobacco Use   Smoking status: Never   Smokeless tobacco: Never  Vaping Use   Vaping status: Never Used  Substance Use Topics   Alcohol  use: Not Currently   Drug use: Not Currently       OPHTHALMIC EXAM:  Base Eye Exam     Visual Acuity (Snellen - Linear)       Right Left   Dist Goodell 20/60 CF at 2'   Dist ph  20/40          Tonometry (Tonopen, 9:17 AM)       Right Left   Pressure 11 10         Pupils       Dark Light Shape React APD   Right 3 2 Round Brisk None   Left 3 3 Round NR None         Visual Fields (Counting fingers)       Left Right     Full   Restrictions Partial outer superior temporal, inferior temporal, superior nasal, inferior nasal deficiencies          Extraocular Movement       Right Left    Full, Ortho Full, Ortho         Neuro/Psych     Oriented x3: Yes   Mood/Affect: Normal         Dilation     Both eyes: 1.0% Mydriacyl , 2.5% Phenylephrine  @ 9:17 AM           Slit Lamp and Fundus Exam     External Exam       Right Left   External Normal Normal         Slit Lamp Exam       Right Left   Lids/Lashes Dermatochalasis - upper lid Dermatochalasis - upper lid   Conjunctiva/Sclera White and quiet nasal and temporal pinguecula, mild subconj oil cysts nasally   Cornea 1-2+ pigmented guttata, well healed cataract wound, trace tear film debris Trace PEE, mild arcus, well healed cataract wound, trace Guttata, focal corneal haze and irregular epi at 0800   Anterior Chamber  Deep and quiet moderate depth, narrow angles   Iris Round and dilated Round and dilated, No NVI   Lens PC IOL in good position, 1+posterior capsular opacification PC IOL in good position, 1+Posterior capsular opacification   Anterior Vitreous Vitreous syneresis, Posterior vitreous detachment, blood stained  Vitreous condensations settling inferiorly post vitrectomy, good silicone oil fill         Fundus Exam       Right Left   Disc Pink and sharp, no NVD 2+ Pallor, Sharp rim   C/D Ratio 0.5 0.3   Macula Flat, Blunted foveal reflex, scattered MA/DBH, +edema and exudates central and temporal macula -- slightly improved flat under oil, scattered MA/DBH, +edema temporal macula and fovea   Vessels attenuated, Tortuous attenuated, Tortuous   Periphery Attached, scattered MA/DBH, inferior retina obsured by [facility] / blood clots Inferonasal retina reattached under oil; 360 PRP laser changes, PRE-OP: 360 MA/DBH, focal tractional RD IN quad -- focal tractional fibrosis / NVE along IN arcades - SRF extends posteriorly to IN disc           IMAGING AND PROCEDURES  Imaging and Procedures for 03/06/2024  OCT, Retina - OU - Both Eyes        Right Eye Quality was good. Central Foveal Thickness: 292. Progression has improved. Findings include no SRF, abnormal foveal contour, epiretinal membrane, intraretinal fluid (interval improvement in IRF / IRHM temporal fovea and macula, +vitreous opacities -- slightly improved).   Left Eye Quality was good. Central Foveal Thickness: 363. Progression has worsened. Findings include no SRF, abnormal foveal contour, intraretinal hyper-reflective material (Mild interval increase in IRF / edema temporal and superior fovea and mac under silicone oil).   Notes  *Images captured and stored on drive  Diagnosis / Impression:  OD: interval improvement in IRF / IRHM temporal fovea and macula, +vitreous opacities OS: mild interval increase in IRF / edema temporal and superior fovea and mac under silicone oil  Clinical management:  See below  Abbreviations: NFP - Normal foveal profile. CME - cystoid macular edema. PED - pigment epithelial detachment. IRF - intraretinal fluid. SRF - subretinal fluid. EZ - ellipsoid zone. ERM - epiretinal membrane. ORA - outer retinal atrophy.  ORT - outer retinal tubulation. SRHM - subretinal hyper-reflective material. IRHM - intraretinal hyper-reflective material      Intravitreal Injection, Pharmacologic Agent - OD - Right Eye       Time Out 03/06/2024. 10:08 AM. Confirmed correct patient, procedure, site, and patient consented.   Anesthesia Topical anesthesia was used. Anesthetic medications included Lidocaine  2%, Proparacaine  0.5%.   Procedure Preparation included 5% betadine to ocular surface, eyelid speculum. A (32g) needle was used.   Injection: 2 mg aflibercept 2 MG/0.05ML   Route: Intravitreal, Site: Right Eye   NDC: D2246706, Lot: [id number], Expiration date: 05/31/2025, Waste: 0 mL   Post-op Post injection exam found visual acuity of at least counting fingers. The patient tolerated the procedure well. There were no complications. The patient received written and verbal post procedure care education.      Intravitreal Injection, Pharmacologic Agent - OS - Left Eye       Time Out 03/06/2024. 10:08 AM. Confirmed correct patient, procedure, site, and patient consented.   Anesthesia Topical anesthesia was used. Anesthetic medications included Lidocaine  2%, Proparacaine  0.5%.   Procedure Preparation included 5% betadine to ocular surface, eyelid speculum. A (32g) needle was used.   Injection: 2 mg aflibercept 2 MG/0.05ML   Route: Intravitreal, Site: Left Eye  NDC: Q956576, Lot: [id number], Expiration date: 03/30/2025, Waste: 0 mL   Post-op Post injection exam found visual acuity of at least counting fingers. The patient tolerated the procedure well. There were no complications. The patient received written and verbal post procedure care education.           ASSESSMENT/PLAN:   ICD-10-CM   1. Proliferative diabetic retinopathy of both eyes with macular edema associated with type 2 diabetes mellitus (HCC)  E11.3513 OCT, Retina - OU - Both Eyes    Intravitreal Injection, Pharmacologic Agent - OD -  Right Eye    Intravitreal Injection, Pharmacologic Agent - OS - Left Eye    aflibercept (EYLEA) SOLN 2 mg    aflibercept (EYLEA) SOLN 2 mg    2. Diabetes mellitus treated with oral medication (HCC)  E11.9    Z79.84     3. Encounter for long-term (current) use of insulin  (HCC)  Z79.4     4. Diabetes mellitus treated with injections of non-insulin  medication (HCC)  E11.9    Z79.85     5. Vitreous hemorrhage of both eyes (HCC)  H43.13     6. Retinal detachment, tractional, left eye  H33.42     7. Essential hypertension  I10     8. Hypertensive retinopathy of both eyes  H35.033     9. Pseudophakia, both eyes  Z96.1      1-5. 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 - BCVA OD 20/40 from 20/100; OS CF - stable - exam shows interval improvement in diffuse VH OD; OS stably reattached under oil - OCT shows: OD: interval improvement in IRF / IRHM temporal fovea and macula, +vitreous opacities; OS: mild interval increase in IRF / edema temporal and superior fovea and mac under silicone oil **discussed decreased efficacy / resistance to Avastin  and potential benefit of switching medication**  - recommend swtiching to IVE OU #1 today, 06.06 25 for persistent DME w/ f/u in 4 wks again - pt wishes to proceed with injections - RBA of procedure discussed, questions answered - IVE informed consent obtained and signed, 06.06.25 (OU) -  see procedure note - cont Brimonidine  BID OS - post op drop and positioning instructions reviewed  - tylenol /ibuprofen  for pain  - Eylea approved through insurance for 2025 - f/u 4 weeks, DFE, OCT  6. Traction retinal detachment, OS - focal traction RD IN quad--focal traction fibrosis along IN arcades, SRF extended IN disc - s/p PPV/MP/PFO/EL/SO OS, 10.24.2024 -- see above - retina reattached under oil - discussed possible silicon oil removal this spring or summer   7,8. Hypertensive retinopathy OU - discussed importance of tight BP control - monitor   9. Pseudophakia OU  - s/p CE/IOL OU (Dr. Carloyn Chi)  - IOLs in good position, doing well  - monitor   Ophthalmic Meds Ordered this visit:  Meds ordered this encounter  Medications   aflibercept (EYLEA) SOLN 2 mg  aflibercept (EYLEA) SOLN 2 mg     Return in about 4 weeks (around 04/03/2024) for f/u PDR OU, DFE, OCT.  This document serves as a record of services personally performed by Jeanice Millard, MD, PhD. It was created on their behalf by Eller Gut COT, an ophthalmic technician. The creation of this record is the provider's dictation and/or activities during the visit.    Electronically signed by: Eller Gut COT 06.05.2025  11:18 AM  This document serves as a record of services personally performed by Jeanice Millard, MD, PhD. It was created on their behalf by Morley Arabia. Bevin Bucks, OA an ophthalmic technician. The creation of this record is the provider's dictation and/or activities during the visit.    Electronically signed by: Morley Arabia. Bevin Bucks, OA 03/06/24 11:18 AM  Jeanice Millard, M.D., Ph.D. Diseases & Surgery of the Retina and Vitreous [facility] 03/06/2024   I have reviewed the above documentation for accuracy and completeness, and I agree with the above. Jeanice Millard, M.D., Ph.D. 03/06/24 11:21 AM   Abbreviations: M myopia (nearsighted); A astigmatism; H hyperopia  (farsighted); P presbyopia; Mrx spectacle prescription;  CTL contact lenses; OD right eye; OS left eye; OU both eyes  XT exotropia; ET esotropia; PEK punctate epithelial keratitis; PEE punctate epithelial erosions; DES dry eye syndrome; MGD meibomian gland dysfunction; ATs artificial tears; PFAT's preservative free artificial tears; NSC nuclear sclerotic cataract; PSC posterior subcapsular cataract; ERM epi-retinal membrane; PVD posterior vitreous detachment; RD retinal detachment; DM diabetes mellitus; DR diabetic retinopathy; NPDR non-proliferative diabetic retinopathy; PDR proliferative diabetic retinopathy; CSME clinically significant macular edema; DME diabetic macular edema; dbh dot blot hemorrhages; CWS cotton wool spot; POAG primary open angle glaucoma; C/D cup-to-disc ratio; HVF humphrey visual field; GVF goldmann visual field; OCT optical coherence tomography; IOP intraocular pressure; BRVO Branch retinal vein occlusion; CRVO central retinal vein occlusion; CRAO central retinal artery occlusion; BRAO branch retinal artery occlusion; RT retinal tear; SB scleral buckle; PPV pars plana vitrectomy; VH Vitreous hemorrhage; PRP panretinal laser photocoagulation; IVK intravitreal kenalog ; VMT vitreomacular traction; MH Macular hole;  NVD neovascularization of the disc; NVE neovascularization elsewhere; AREDS age related eye disease study; ARMD age related macular degeneration; POAG primary open angle glaucoma; EBMD epithelial/anterior basement membrane dystrophy; ACIOL anterior chamber intraocular lens; IOL intraocular lens; PCIOL posterior chamber intraocular lens; Phaco/IOL phacoemulsification with intraocular lens placement; PRK photorefractive keratectomy; LASIK laser assisted in situ keratomileusis; HTN hypertension; DM diabetes mellitus; COPD chronic obstructive pulmonary disease

## 2024-03-06 ENCOUNTER — Encounter (INDEPENDENT_AMBULATORY_CARE_PROVIDER_SITE_OTHER): Payer: Self-pay | Admitting: Ophthalmology

## 2024-03-06 ENCOUNTER — Ambulatory Visit (INDEPENDENT_AMBULATORY_CARE_PROVIDER_SITE_OTHER): Admitting: Ophthalmology

## 2024-03-06 DIAGNOSIS — E113513 Type 2 diabetes mellitus with proliferative diabetic retinopathy with macular edema, bilateral: Secondary | ICD-10-CM

## 2024-03-06 DIAGNOSIS — I10 Essential (primary) hypertension: Secondary | ICD-10-CM

## 2024-03-06 DIAGNOSIS — Z7984 Long term (current) use of oral hypoglycemic drugs: Secondary | ICD-10-CM

## 2024-03-06 DIAGNOSIS — Z961 Presence of intraocular lens: Secondary | ICD-10-CM

## 2024-03-06 DIAGNOSIS — E119 Type 2 diabetes mellitus without complications: Secondary | ICD-10-CM

## 2024-03-06 DIAGNOSIS — H35033 Hypertensive retinopathy, bilateral: Secondary | ICD-10-CM

## 2024-03-06 DIAGNOSIS — H4313 Vitreous hemorrhage, bilateral: Secondary | ICD-10-CM

## 2024-03-06 DIAGNOSIS — Z7985 Long-term (current) use of injectable non-insulin antidiabetic drugs: Secondary | ICD-10-CM

## 2024-03-06 DIAGNOSIS — Z794 Long term (current) use of insulin: Secondary | ICD-10-CM | POA: Diagnosis not present

## 2024-03-06 DIAGNOSIS — H3342 Traction detachment of retina, left eye: Secondary | ICD-10-CM

## 2024-03-06 MED ORDER — AFLIBERCEPT 2MG/0.05ML IZ SOLN FOR KALEIDOSCOPE
2.0000 mg | INTRAVITREAL | Status: AC | PRN
  Administered 2024-03-06: 2 mg via INTRAVITREAL

## 2024-03-06 MED ORDER — AFLIBERCEPT 2MG/0.05ML IZ SOLN FOR KALEIDOSCOPE
2.0000 mg | INTRAVITREAL | Status: AC | PRN
Start: 2024-03-06 — End: 2024-03-06
  Administered 2024-03-06: 2 mg via INTRAVITREAL

## 2024-03-15 ENCOUNTER — Encounter (HOSPITAL_COMMUNITY): Payer: Self-pay

## 2024-03-15 ENCOUNTER — Emergency Department (HOSPITAL_COMMUNITY)
Admission: EM | Admit: 2024-03-15 | Discharge: 2024-03-16 | Disposition: A | Discharge status: Home / Self Care | Attending: Emergency Medicine | Admitting: Emergency Medicine

## 2024-03-15 ENCOUNTER — Emergency Department (HOSPITAL_COMMUNITY)

## 2024-03-15 ENCOUNTER — Other Ambulatory Visit: Payer: Self-pay

## 2024-03-15 DIAGNOSIS — N3000 Acute cystitis without hematuria: Secondary | ICD-10-CM | POA: Diagnosis not present

## 2024-03-15 DIAGNOSIS — Z79899 Other long term (current) drug therapy: Secondary | ICD-10-CM | POA: Diagnosis not present

## 2024-03-15 DIAGNOSIS — Z794 Long term (current) use of insulin: Secondary | ICD-10-CM | POA: Diagnosis not present

## 2024-03-15 DIAGNOSIS — E119 Type 2 diabetes mellitus without complications: Secondary | ICD-10-CM | POA: Diagnosis not present

## 2024-03-15 DIAGNOSIS — I10 Essential (primary) hypertension: Secondary | ICD-10-CM | POA: Diagnosis not present

## 2024-03-15 DIAGNOSIS — R109 Unspecified abdominal pain: Secondary | ICD-10-CM | POA: Diagnosis present

## 2024-03-15 LAB — CBC WITH DIFFERENTIAL/PLATELET
Abs Immature Granulocytes: 0.03 10*3/uL (ref 0.00–0.07)
Basophils Absolute: 0 10*3/uL (ref 0.0–0.1)
Basophils Relative: 0 %
Eosinophils Absolute: 0 10*3/uL (ref 0.0–0.5)
Eosinophils Relative: 0 %
HCT: 36.2 % — ABNORMAL LOW (ref 39.0–52.0)
Hemoglobin: 10.9 g/dL — ABNORMAL LOW (ref 13.0–17.0)
Immature Granulocytes: 0 %
Lymphocytes Relative: 10 %
Lymphs Abs: 0.7 10*3/uL (ref 0.7–4.0)
MCH: 24.1 pg — ABNORMAL LOW (ref 26.0–34.0)
MCHC: 30.1 g/dL (ref 30.0–36.0)
MCV: 80.1 fL (ref 80.0–100.0)
Monocytes Absolute: 0.4 10*3/uL (ref 0.1–1.0)
Monocytes Relative: 5 %
Neutro Abs: 6.1 10*3/uL (ref 1.7–7.7)
Neutrophils Relative %: 85 %
Platelets: 341 10*3/uL (ref 150–400)
RBC: 4.52 MIL/uL (ref 4.22–5.81)
RDW: 17.1 % — ABNORMAL HIGH (ref 11.5–15.5)
WBC: 7.2 10*3/uL (ref 4.0–10.5)
nRBC: 0 % (ref 0.0–0.2)

## 2024-03-15 LAB — COMPREHENSIVE METABOLIC PANEL WITH GFR
ALT: 65 U/L — ABNORMAL HIGH (ref 0–44)
AST: 76 U/L — ABNORMAL HIGH (ref 15–41)
Albumin: 3.3 g/dL — ABNORMAL LOW (ref 3.5–5.0)
Alkaline Phosphatase: 101 U/L (ref 38–126)
Anion gap: 13 (ref 5–15)
BUN: 25 mg/dL — ABNORMAL HIGH (ref 6–20)
CO2: 22 mmol/L (ref 22–32)
Calcium: 9.1 mg/dL (ref 8.9–10.3)
Chloride: 101 mmol/L (ref 98–111)
Creatinine, Ser: 1.48 mg/dL — ABNORMAL HIGH (ref 0.61–1.24)
GFR, Estimated: 54 mL/min — ABNORMAL LOW (ref >60)
Glucose, Bld: 375 mg/dL — ABNORMAL HIGH (ref 70–99)
Potassium: 5 mmol/L (ref 3.5–5.1)
Sodium: 136 mmol/L (ref 135–145)
Total Bilirubin: 0.4 mg/dL (ref 0.0–1.2)
Total Protein: 7.6 g/dL (ref 6.5–8.1)

## 2024-03-15 LAB — URINALYSIS, W/ REFLEX TO CULTURE (INFECTION SUSPECTED)
Bacteria, UA: NONE SEEN
Bilirubin Urine: NEGATIVE
Glucose, UA: >=500 mg/dL — AB
Hgb urine dipstick: NEGATIVE
Ketones, ur: NEGATIVE mg/dL
Nitrite: POSITIVE — AB
Protein, ur: NEGATIVE mg/dL
Specific Gravity, Urine: 1.018 (ref 1.005–1.030)
pH: 5 (ref 5.0–8.0)

## 2024-03-15 MED ORDER — FENTANYL CITRATE PF 50 MCG/ML IJ SOSY
50.0000 ug | PREFILLED_SYRINGE | Freq: Once | INTRAMUSCULAR | Status: AC
  Administered 2024-03-15: 50 ug via INTRAVENOUS
  Filled 2024-03-15: qty 1

## 2024-03-15 MED ORDER — MORPHINE SULFATE (PF) 4 MG/ML IV SOLN
4.0000 mg | Freq: Once | INTRAVENOUS | Status: AC
  Administered 2024-03-15: 4 mg via INTRAVENOUS
  Filled 2024-03-15: qty 1

## 2024-03-15 MED ORDER — ONDANSETRON HCL 4 MG/2ML IJ SOLN
4.0000 mg | Freq: Once | INTRAMUSCULAR | Status: AC
  Administered 2024-03-15: 4 mg via INTRAVENOUS
  Filled 2024-03-15: qty 2

## 2024-03-15 MED ORDER — SODIUM CHLORIDE 0.9 % IV SOLN
1.0000 g | Freq: Once | INTRAVENOUS | Status: AC
  Administered 2024-03-15: 1 g via INTRAVENOUS
  Filled 2024-03-15: qty 10

## 2024-03-15 NOTE — ED Triage Notes (Signed)
 Pt arrives from [place] with reports of right side middle back pain and into flank over the last couple days. Pt reports dark and foul smelling urine as well as increased pain with inspiration. Pt alert and oriented x4.

## 2024-03-15 NOTE — ED Provider Notes (Signed)
 [place] EMERGENCY DEPARTMENT AT [facility] Provider Note   CSN: [id number] Arrival date & time: 03/15/24  2105     Patient presents with: Flank Pain   Legacy Lacivita is a 63 y.o. male.  {Add pertinent medical, surgical, social history, OB history to NWG:95621} The history is provided by the patient and medical records.  Flank Pain   63 y.o. M with hx of adjustment disorder, DM, GERD, HTN, neuropathy, presenting to the ED for flank pain.  Has been ongoing x5 days, seems to be worsening.  Denies any dysuria but urine has been discolored and seems dark.  No fever/chills.  States has had some nausea but denies vomiting.  Has been drinking fluids but not really wanting to eat.  No prior hx of kidney stones.  Hx of UTI about 10 years ago.  Prior to Admission medications   Medication Sig Start Date End Date Taking? Authorizing Provider  acetaminophen  (TYLENOL ) 325 MG tablet Take 2 tablets (650 mg total) by mouth every 6 (six) hours as needed for mild pain (or Fever >/= 101). 06/13/22   Lonita Roach, MD  amLODipine  (NORVASC ) 10 MG tablet Take 10 mg by mouth every morning. 05/11/22   [provider]  atorvastatin  (LIPITOR) 40 MG tablet Take 40 mg by mouth at bedtime. 11/07/21   [provider]  bacitracin -polymyxin b  (POLYSPORIN ) ophthalmic ointment Place into the right eye 4 (four) times daily. Place a 1/2 inch ribbon of ointment into the lower eyelid 4 a day and as needed for discomfort 08/01/23   Ronelle Coffee, MD  bisacodyl  (DULCOLAX) 5 MG EC tablet Take 10 mg by mouth daily as needed (CONSTIPATION).    [provider]  brimonidine  (ALPHAGAN ) 0.2 % ophthalmic solution Place 1 drop into the left eye in the morning and at bedtime. 08/29/23   [provider]  carboxymethylcellulose 1 % ophthalmic solution Place 4 drops into the left eye in the morning, at noon, in the evening, and at bedtime.    [provider]  cyclobenzaprine   (FLEXERIL ) 5 MG tablet Take 1 tablet (5 mg total) by mouth 3 (three) times daily as needed for muscle spasms. 06/13/22   Lonita Roach, MD  DULoxetine  (CYMBALTA ) 60 MG capsule Take 60 mg by mouth daily. 08/28/23   [provider]  ergocalciferol  (VITAMIN D2) 1.25 MG (50000 UT) capsule Take 50,000 Units by mouth once a week. Fridays    [provider]  famotidine  (PEPCID ) 20 MG tablet Take 20 mg by mouth daily. 04/20/23   [provider]  FARXIGA  10 MG TABS tablet Take 10 mg by mouth every morning. Dapaglifozin 02/07/22   [provider]  furosemide  (LASIX ) 20 MG tablet Take 20 mg by mouth every morning. 05/11/22   [provider]  glipiZIDE  (GLUCOTROL ) 10 MG tablet Take 10 mg by mouth every morning. 05/11/22   [provider]  guaiFENesin  (ROBITUSSIN) 100 MG/5ML liquid Take 10 mLs by mouth every 4 (four) hours as needed for cough or to loosen phlegm.    [provider]  hydrocortisone cream 1 % Apply 1 Application topically daily. Apply to right lower leg for wound care Patient not taking: Reported on 11/20/2023    [provider]  hydrOXYzine  (ATARAX ) 10 MG tablet Take 1 tablet (10 mg total) by mouth 3 (three) times daily as needed for itching. Patient not taking: Reported on 11/20/2023 03/06/23   Zamora, Erin R, NP  insulin  lispro (HUMALOG) 100 UNIT/ML KwikPen  Inject 2-8 Units into the skin 3 (three) times daily with meals. Sliding scale 201-250=2 units 251-300=4 units 301-350=6 units 351-400= 8 units 401 or greater than=call [facility] 04/18/23   [provider]  ipratropium-albuterol  (DUONEB) 0.5-2.5 (3) MG/3ML SOLN Take 3 mLs by nebulization every 4 (four) hours as needed. 11/26/23   Elgergawy, Ardia Kraft, MD  ketorolac  (ACULAR ) 0.4 % SOLN Place 1 drop into the left eye 4 (four) times daily.    [provider]  LINZESS  145 MCG CAPS capsule Take 145 mcg by mouth daily. 06/11/23   [provider]  losartan   (COZAAR ) 50 MG tablet Take 1 tablet (50 mg total) by mouth daily. 02/24/22   Macdonald Savoy, MD  magnesium  hydroxide (MILK OF MAGNESIA) 400 MG/5ML suspension Take 30 mLs by mouth daily as needed for mild constipation.    [provider]  Melatonin 5 MG CAPS Take 10 mg by mouth at bedtime.    [provider]  moxifloxacin (VIGAMOX) 0.5 % ophthalmic solution Place 1 drop into the left eye in the morning and at bedtime. For 7 days    [provider]  naloxone  (NARCAN ) nasal spray 4 mg/0.1 mL Place 1 spray into the nose See admin instructions. Every 2-3 minutesas needed until patient response/ emba arrived. 11/18/22   Krishnan, Gokul, MD  oxyCODONE -acetaminophen  (PERCOCET) 10-325 MG tablet Take 1 tablet by mouth every 4 (four) hours as needed for pain. 11/26/23   Elgergawy, Ardia Kraft, MD  pantoprazole  (PROTONIX ) 40 MG tablet Take 1 tablet (40 mg total) by mouth daily. 10/20/21   Ghimire, Estil Heman, MD  polyethylene glycol (MIRALAX  / GLYCOLAX ) 17 g packet Take 17 g by mouth daily. Patient taking differently: Take 17 g by mouth daily. With 4 oz of fluids and drink 11/18/22   Maylene Spear, MD  Polyethylene Glycol 3350  POWD Take 17 g by mouth every morning. Mix with 4 oz of fluids Patient not taking: Reported on 11/20/2023    [provider]  prednisoLONE  acetate (PRED FORTE ) 1 % ophthalmic suspension Place 1 drop into the left eye 4 (four) times daily. 11/26/23   Elgergawy, Ardia Kraft, MD  pregabalin  (LYRICA ) 100 MG capsule Take 1 capsule (100 mg total) by mouth 2 (two) times daily. Patient taking differently: Take 100 mg by mouth 4 (four) times daily. 11/18/22   Krishnan, Gokul, MD  sertraline  (ZOLOFT ) 50 MG tablet Take 50 mg by mouth daily. 04/30/23   [provider]  SODIUM PHOSPHATES RE Place 1 application  rectally daily. As needed constipation 3 of 4    [provider]  traZODone  (DESYREL ) 150 MG tablet Take 75 mg by mouth at bedtime. 05/30/23    [provider]  TRULICITY  1.5 MG/0.5ML SOPN Inject 0.5 mg into the skin once a week. Thursdays 04/04/23   [provider]    Allergies: Patient has no known allergies.    Review of Systems  Genitourinary:  Positive for flank pain.  All other systems reviewed and are negative.   Updated Vital Signs BP (!) 163/83   Pulse 100   Temp 98.1 F (36.7 C) (Oral)   Resp 18   Ht 6' 2 (1.88 m)   Wt 120.7 kg   SpO2 97%   BMI 34.15 kg/m   Physical Exam Vitals and nursing note reviewed.  Constitutional:      Appearance: He is well-developed.  HENT:     Head: Normocephalic and atraumatic.   Eyes:     Conjunctiva/sclera:  Conjunctivae normal.     Pupils: Pupils are equal, round, and reactive to light.    Cardiovascular:     Rate and Rhythm: Normal rate and regular rhythm.     Heart sounds: Normal heart sounds.  Pulmonary:     Effort: Pulmonary effort is normal. No respiratory distress.     Breath sounds: Normal breath sounds. No rhonchi.  Abdominal:     General: Bowel sounds are normal.     Palpations: Abdomen is soft.   Musculoskeletal:        General: Normal range of motion.     Cervical back: Normal range of motion.   Skin:    General: Skin is warm and dry.   Neurological:     Mental Status: He is alert and oriented to person, place, and time.     (all labs ordered are listed, but only abnormal results are displayed) Labs Reviewed  URINALYSIS, W/ REFLEX TO CULTURE (INFECTION SUSPECTED) - Abnormal; Notable for the following components:      Result Value   Color, Urine STRAW (*)    Glucose, UA >=500 (*)    Nitrite POSITIVE (*)    Leukocytes,Ua SMALL (*)    All other components within normal limits  COMPREHENSIVE METABOLIC PANEL WITH GFR - Abnormal; Notable for the following components:   Glucose, Bld 375 (*)    BUN 25 (*)    Creatinine, Ser 1.48 (*)    Albumin  3.3 (*)    AST 76 (*)    ALT 65 (*)    GFR, Estimated 54 (*)    All other  components within normal limits  URINE CULTURE  CBC WITH DIFFERENTIAL/PLATELET    EKG: None  Radiology: [facility] Chest Port 1 View Result Date: 03/15/2024 CLINICAL DATA:  Shortness of breath and flank pain. Increased pain with inspiration. EXAM: PORTABLE CHEST 1 VIEW COMPARISON:  CTA chest 11/20/2023 FINDINGS: The heart size and mediastinal contours are within normal limits apart from a slightly aneurysmal ascending aortic shadow, seen previously. There is aortic atherosclerosis. Both lungs are clear. The visualized skeletal structures are unremarkable. IMPRESSION: No evidence of acute chest disease. Again noted slightly aneurysmal ascending aortic shadow. Compare: Interval resolution of bilateral airspace disease noted previously. Electronically Signed   By: Denman Fischer M.D.   On: 03/15/2024 21:59    {Document cardiac monitor, telemetry assessment procedure when appropriate:32947} Procedures   Medications Ordered in the ED - No data to display    {Click here for ABCD2, HEART and other calculators REFRESH Note before signing:1}                              Medical Decision Making Amount and/or Complexity of Data Reviewed Labs: ordered. Radiology: ordered and independent interpretation performed. ECG/medicine tests: ordered and independent interpretation performed.  Risk Prescription drug management.   ***  {Document critical care time when appropriate  Document review of labs and clinical decision tools ie CHADS2VASC2, etc  Document your independent review of radiology images and any outside records  Document your discussion with family members, caretakers and with consultants  Document social determinants of health affecting pt's care  Document your decision making why or why not admission, treatments were needed:32947:::1}   Final diagnoses:  None    ED Discharge Orders     None

## 2024-03-16 DIAGNOSIS — N3000 Acute cystitis without hematuria: Secondary | ICD-10-CM | POA: Diagnosis not present

## 2024-03-16 MED ORDER — OXYCODONE-ACETAMINOPHEN 5-325 MG PO TABS
2.0000 | ORAL_TABLET | Freq: Once | ORAL | Status: AC
  Administered 2024-03-16: 2 via ORAL
  Filled 2024-03-16: qty 2

## 2024-03-16 MED ORDER — CEPHALEXIN 500 MG PO CAPS: 500.0000 mg | ORAL_CAPSULE | Freq: Three times a day (TID) | ORAL | 0 refills | Status: DC

## 2024-03-16 NOTE — ED Notes (Signed)
 [facility] called

## 2024-03-16 NOTE — Discharge Instructions (Addendum)
 You were found to have urine infection today.  Take prescribed medications as directed. Follow-up with your doctor. Return to the ED for new or worsening symptoms.

## 2024-03-18 LAB — URINE CULTURE: Culture: >=100000 — AB

## 2024-03-19 ENCOUNTER — Telehealth (HOSPITAL_BASED_OUTPATIENT_CLINIC_OR_DEPARTMENT_OTHER): Payer: Self-pay | Admitting: *Deleted

## 2024-03-19 NOTE — Telephone Encounter (Signed)
 Post ED Visit - Positive Culture Follow-up  Culture report reviewed by antimicrobial stewardship pharmacist: [facility] []  Court Distance, Pharm.D. []  Skeet Duke, [address].D., BCPS AQ-ID []  Leslee Rase, Pharm.D., BCPS []  Garland Junk, Pharm.D., BCPS []  [place], [address].D., BCPS, AAHIVP []  Alcide Aly, Pharm.D., BCPS, AAHIVP []  Jerri Morale, PharmD, BCPS []  Graham Laws, PharmD, BCPS []  Cleda Curly, PharmD, BCPS []  Tamar Fairly, PharmD []  Ballard Levels, PharmD, BCPS []  Ollen Beverage, PharmD  [facility] Smalling [facility] Team PharmD []  Denson Flake PharmD []  [place], PharmD []  Delila Felty, Rph []  [place]) Cleora Daft, PharmD []  Augustina Block, PharmD []  Arie Kurtz, PharmD []  Sharlyn Deaner, PharmD []  Agnes Hose, PharmD []  Kendall Pauls, PharmD []  Gladstone Lamer, PharmD []  Armanda Bern, PharmD []  Tera Fellows, PharmD   Positive urine culture Treated with cephalexin. Pharmacy called RN at [facility] and patient is not having any symptoms UTI, no fever or urinary sx-likely asymptomatic bacteruria- No further treatment. 03/19/2024, 1:31 PM

## 2024-03-19 NOTE — Progress Notes (Signed)
 ED Antimicrobial Stewardship Positive Culture Follow Up   Brett White is an 63 y.o. male who presented to [facility] on 03/15/2024 with a chief complaint of  Chief Complaint  Patient presents with   Flank Pain    Recent Results (from the past 720 hours)  Urine Culture     Status: Abnormal   Collection Time: 03/15/24  9:31 PM   Specimen: Urine, Random  Result Value Ref Range Status   Specimen Description   Final    URINE, RANDOM Performed at [facility], [address] [place], [place] [address]    Special Requests   Final    NONE Reflexed from [id number] Performed at [facility], [address], [place], [place] [address]    Culture (A)  Final    >=100,000 COLONIES/mL ESCHERICHIA COLI Confirmed Extended Spectrum Beta-Lactamase Producer (ESBL).  In bloodstream infections from ESBL organisms, carbapenems are preferred over piperacillin /tazobactam. They are shown to have a lower risk of mortality.    Report Status 03/18/2024 FINAL  Final   Organism ID, Bacteria ESCHERICHIA COLI (A)  Final      Susceptibility   Escherichia coli - MIC*    AMPICILLIN  >=32 RESISTANT Resistant     CEFAZOLIN  >=64 RESISTANT Resistant     CEFEPIME  >=32 RESISTANT Resistant     CEFTRIAXONE  >=64 RESISTANT Resistant     CIPROFLOXACIN >=4 RESISTANT Resistant     GENTAMICIN  <=1 SENSITIVE Sensitive     IMIPENEM <=0.25 SENSITIVE Sensitive     NITROFURANTOIN <=16 SENSITIVE Sensitive     TRIMETH /SULFA  >=320 RESISTANT Resistant     AMPICILLIN /SULBACTAM >=32 RESISTANT Resistant     PIP/TAZO <=4 SENSITIVE Sensitive ug/mL    * >=100,000 COLONIES/mL ESCHERICHIA COLI    [name] presents with right kidney pain. Negative for CVA tenderness, dysuria, vomiting, and fever. CT notes presence of stones in right kidney. No concerns of pyelonephritis. Called SNF to check in on patient. RN reports right kidney pain but no other urinary symptoms. Pain likely due to the kidney stones. No  further treatment necessary.    ED Provider: Scott Zackowski, MD   Vashti Gentles 03/19/2024, 11:11 AM PharmD Candidate 2026 Monday - Friday phone -  [phone] Saturday - Sunday phone - [phone]

## 2024-03-20 ENCOUNTER — Inpatient Hospital Stay (HOSPITAL_COMMUNITY)
Admission: EM | Admit: 2024-03-20 | Discharge: 2024-03-24 | DRG: 690 | Disposition: A | Discharge status: Skilled Nursing Facility | Source: Skilled Nursing Facility | Attending: Internal Medicine | Admitting: Internal Medicine

## 2024-03-20 ENCOUNTER — Emergency Department (HOSPITAL_COMMUNITY)

## 2024-03-20 ENCOUNTER — Other Ambulatory Visit: Payer: Self-pay

## 2024-03-20 ENCOUNTER — Encounter (HOSPITAL_COMMUNITY): Payer: Self-pay

## 2024-03-20 DIAGNOSIS — Z8673 Personal history of transient ischemic attack (TIA), and cerebral infarction without residual deficits: Secondary | ICD-10-CM

## 2024-03-20 DIAGNOSIS — Z8701 Personal history of pneumonia (recurrent): Secondary | ICD-10-CM

## 2024-03-20 DIAGNOSIS — G6289 Other specified polyneuropathies: Secondary | ICD-10-CM

## 2024-03-20 DIAGNOSIS — J4520 Mild intermittent asthma, uncomplicated: Secondary | ICD-10-CM

## 2024-03-20 DIAGNOSIS — G4700 Insomnia, unspecified: Secondary | ICD-10-CM | POA: Diagnosis present

## 2024-03-20 DIAGNOSIS — M545 Low back pain, unspecified: Secondary | ICD-10-CM

## 2024-03-20 DIAGNOSIS — Z7985 Long-term (current) use of injectable non-insulin antidiabetic drugs: Secondary | ICD-10-CM

## 2024-03-20 DIAGNOSIS — Z89512 Acquired absence of left leg below knee: Secondary | ICD-10-CM

## 2024-03-20 DIAGNOSIS — N200 Calculus of kidney: Secondary | ICD-10-CM | POA: Diagnosis present

## 2024-03-20 DIAGNOSIS — N3000 Acute cystitis without hematuria: Principal | ICD-10-CM

## 2024-03-20 DIAGNOSIS — I152 Hypertension secondary to endocrine disorders: Secondary | ICD-10-CM | POA: Diagnosis present

## 2024-03-20 DIAGNOSIS — N179 Acute kidney failure, unspecified: Secondary | ICD-10-CM | POA: Diagnosis present

## 2024-03-20 DIAGNOSIS — E119 Type 2 diabetes mellitus without complications: Secondary | ICD-10-CM

## 2024-03-20 DIAGNOSIS — Z833 Family history of diabetes mellitus: Secondary | ICD-10-CM

## 2024-03-20 DIAGNOSIS — Z8679 Personal history of other diseases of the circulatory system: Secondary | ICD-10-CM

## 2024-03-20 DIAGNOSIS — E1142 Type 2 diabetes mellitus with diabetic polyneuropathy: Secondary | ICD-10-CM | POA: Diagnosis present

## 2024-03-20 DIAGNOSIS — E1165 Type 2 diabetes mellitus with hyperglycemia: Secondary | ICD-10-CM | POA: Diagnosis present

## 2024-03-20 DIAGNOSIS — N182 Chronic kidney disease, stage 2 (mild): Secondary | ICD-10-CM | POA: Diagnosis present

## 2024-03-20 DIAGNOSIS — R109 Unspecified abdominal pain: Secondary | ICD-10-CM

## 2024-03-20 DIAGNOSIS — F1120 Opioid dependence, uncomplicated: Secondary | ICD-10-CM | POA: Insufficient documentation

## 2024-03-20 DIAGNOSIS — N189 Chronic kidney disease, unspecified: Secondary | ICD-10-CM | POA: Diagnosis present

## 2024-03-20 DIAGNOSIS — Z79899 Other long term (current) drug therapy: Secondary | ICD-10-CM

## 2024-03-20 DIAGNOSIS — J45909 Unspecified asthma, uncomplicated: Secondary | ICD-10-CM | POA: Diagnosis present

## 2024-03-20 DIAGNOSIS — E66811 Obesity, class 1: Secondary | ICD-10-CM | POA: Diagnosis present

## 2024-03-20 DIAGNOSIS — A499 Bacterial infection, unspecified: Principal | ICD-10-CM

## 2024-03-20 DIAGNOSIS — R2241 Localized swelling, mass and lump, right lower limb: Secondary | ICD-10-CM

## 2024-03-20 DIAGNOSIS — E7849 Other hyperlipidemia: Secondary | ICD-10-CM

## 2024-03-20 DIAGNOSIS — N281 Cyst of kidney, acquired: Secondary | ICD-10-CM | POA: Diagnosis present

## 2024-03-20 DIAGNOSIS — E875 Hyperkalemia: Secondary | ICD-10-CM

## 2024-03-20 DIAGNOSIS — G894 Chronic pain syndrome: Secondary | ICD-10-CM | POA: Diagnosis present

## 2024-03-20 DIAGNOSIS — Z794 Long term (current) use of insulin: Secondary | ICD-10-CM

## 2024-03-20 DIAGNOSIS — Z79891 Long term (current) use of opiate analgesic: Secondary | ICD-10-CM

## 2024-03-20 DIAGNOSIS — R7401 Elevation of levels of liver transaminase levels: Secondary | ICD-10-CM | POA: Diagnosis present

## 2024-03-20 DIAGNOSIS — K219 Gastro-esophageal reflux disease without esophagitis: Secondary | ICD-10-CM | POA: Diagnosis present

## 2024-03-20 DIAGNOSIS — F411 Generalized anxiety disorder: Secondary | ICD-10-CM | POA: Diagnosis present

## 2024-03-20 DIAGNOSIS — E1169 Type 2 diabetes mellitus with other specified complication: Secondary | ICD-10-CM | POA: Insufficient documentation

## 2024-03-20 DIAGNOSIS — Z1612 Extended spectrum beta lactamase (ESBL) resistance: Secondary | ICD-10-CM | POA: Diagnosis present

## 2024-03-20 DIAGNOSIS — I10 Essential (primary) hypertension: Secondary | ICD-10-CM | POA: Diagnosis present

## 2024-03-20 DIAGNOSIS — G629 Polyneuropathy, unspecified: Secondary | ICD-10-CM

## 2024-03-20 DIAGNOSIS — B9620 Unspecified Escherichia coli [E. coli] as the cause of diseases classified elsewhere: Secondary | ICD-10-CM | POA: Diagnosis present

## 2024-03-20 DIAGNOSIS — Z6834 Body mass index (BMI) 34.0-34.9, adult: Secondary | ICD-10-CM

## 2024-03-20 DIAGNOSIS — K581 Irritable bowel syndrome with constipation: Secondary | ICD-10-CM | POA: Diagnosis present

## 2024-03-20 DIAGNOSIS — I129 Hypertensive chronic kidney disease with stage 1 through stage 4 chronic kidney disease, or unspecified chronic kidney disease: Secondary | ICD-10-CM | POA: Diagnosis present

## 2024-03-20 DIAGNOSIS — E785 Hyperlipidemia, unspecified: Secondary | ICD-10-CM | POA: Diagnosis present

## 2024-03-20 DIAGNOSIS — E1122 Type 2 diabetes mellitus with diabetic chronic kidney disease: Secondary | ICD-10-CM | POA: Diagnosis present

## 2024-03-20 LAB — CBC WITH DIFFERENTIAL/PLATELET
Abs Immature Granulocytes: 0.16 10*3/uL — ABNORMAL HIGH (ref 0.00–0.07)
Basophils Absolute: 0.1 10*3/uL (ref 0.0–0.1)
Basophils Relative: 1 %
Eosinophils Absolute: 0.2 10*3/uL (ref 0.0–0.5)
Eosinophils Relative: 2 %
HCT: 42.7 % (ref 39.0–52.0)
Hemoglobin: 12.9 g/dL — ABNORMAL LOW (ref 13.0–17.0)
Immature Granulocytes: 2 %
Lymphocytes Relative: 17 %
Lymphs Abs: 1.4 10*3/uL (ref 0.7–4.0)
MCH: 23.9 pg — ABNORMAL LOW (ref 26.0–34.0)
MCHC: 30.2 g/dL (ref 30.0–36.0)
MCV: 79.2 fL — ABNORMAL LOW (ref 80.0–100.0)
Monocytes Absolute: 0.3 10*3/uL (ref 0.1–1.0)
Monocytes Relative: 4 %
Neutro Abs: 6.1 10*3/uL (ref 1.7–7.7)
Neutrophils Relative %: 74 %
Platelets: 513 10*3/uL — ABNORMAL HIGH (ref 150–400)
RBC: 5.39 MIL/uL (ref 4.22–5.81)
RDW: 17.2 % — ABNORMAL HIGH (ref 11.5–15.5)
WBC: 8.3 10*3/uL (ref 4.0–10.5)
nRBC: 0 % (ref 0.0–0.2)

## 2024-03-20 LAB — COMPREHENSIVE METABOLIC PANEL WITH GFR
ALT: 56 U/L — ABNORMAL HIGH (ref 0–44)
AST: 41 U/L (ref 15–41)
Albumin: 3.6 g/dL (ref 3.5–5.0)
Alkaline Phosphatase: 101 U/L (ref 38–126)
Anion gap: 10 (ref 5–15)
BUN: 24 mg/dL — ABNORMAL HIGH (ref 6–20)
CO2: 26 mmol/L (ref 22–32)
Calcium: 9.5 mg/dL (ref 8.9–10.3)
Chloride: 98 mmol/L (ref 98–111)
Creatinine, Ser: 1.77 mg/dL — ABNORMAL HIGH (ref 0.61–1.24)
GFR, Estimated: 43 mL/min — ABNORMAL LOW (ref >60)
Glucose, Bld: 171 mg/dL — ABNORMAL HIGH (ref 70–99)
Potassium: 5.3 mmol/L — ABNORMAL HIGH (ref 3.5–5.1)
Sodium: 134 mmol/L — ABNORMAL LOW (ref 135–145)
Total Bilirubin: 0.4 mg/dL (ref 0.0–1.2)
Total Protein: 7.6 g/dL (ref 6.5–8.1)

## 2024-03-20 LAB — URINALYSIS, W/ REFLEX TO CULTURE (INFECTION SUSPECTED)
Bacteria, UA: NONE SEEN
Bilirubin Urine: NEGATIVE
Glucose, UA: >=500 mg/dL — AB
Hgb urine dipstick: NEGATIVE
Ketones, ur: NEGATIVE mg/dL
Leukocytes,Ua: NEGATIVE
Nitrite: NEGATIVE
Protein, ur: NEGATIVE mg/dL
Specific Gravity, Urine: 1.016 (ref 1.005–1.030)
pH: 5 (ref 5.0–8.0)

## 2024-03-20 LAB — CBG MONITORING, ED: Glucose-Capillary: 101 mg/dL — ABNORMAL HIGH (ref 70–99)

## 2024-03-20 MED ORDER — LACTATED RINGERS IV SOLN: INTRAVENOUS | Status: DC

## 2024-03-20 MED ORDER — SODIUM CHLORIDE 0.9% FLUSH
3.0000 mL | Freq: Two times a day (BID) | INTRAVENOUS | Status: DC
  Administered 2024-03-21 – 2024-03-23 (×3): 3 mL via INTRAVENOUS

## 2024-03-20 MED ORDER — HYDROMORPHONE HCL 1 MG/ML IJ SOLN
1.0000 mg | Freq: Once | INTRAMUSCULAR | Status: AC
  Administered 2024-03-20: 1 mg via INTRAMUSCULAR

## 2024-03-20 MED ORDER — INSULIN ASPART 100 UNIT/ML IJ SOLN
0.0000 [IU] | Freq: Three times a day (TID) | INTRAMUSCULAR | Status: DC
  Administered 2024-03-21 (×2): 1 [IU] via SUBCUTANEOUS

## 2024-03-20 MED ORDER — ACETAMINOPHEN 325 MG PO TABS: 650.0000 mg | ORAL_TABLET | Freq: Four times a day (QID) | ORAL | Status: DC | PRN

## 2024-03-20 MED ORDER — HYDROMORPHONE HCL 1 MG/ML IJ SOLN
1.0000 mg | Freq: Once | INTRAMUSCULAR | Status: DC
  Filled 2024-03-20: qty 1

## 2024-03-20 MED ORDER — ACETAMINOPHEN 650 MG RE SUPP: 650.0000 mg | Freq: Four times a day (QID) | RECTAL | Status: DC | PRN

## 2024-03-20 MED ORDER — SERTRALINE HCL 25 MG PO TABS
50.0000 mg | ORAL_TABLET | Freq: Every day | ORAL | Status: DC
  Administered 2024-03-21 – 2024-03-24 (×4): 50 mg via ORAL
  Filled 2024-03-20 (×4): qty 2

## 2024-03-20 MED ORDER — ONDANSETRON HCL 4 MG/2ML IJ SOLN
4.0000 mg | Freq: Four times a day (QID) | INTRAMUSCULAR | Status: DC | PRN
Start: 2024-03-20 — End: 2024-03-25

## 2024-03-20 MED ORDER — ONDANSETRON HCL 4 MG/2ML IJ SOLN
4.0000 mg | Freq: Once | INTRAMUSCULAR | Status: AC
  Administered 2024-03-20: 4 mg via INTRAVENOUS
  Filled 2024-03-20: qty 2

## 2024-03-20 MED ORDER — FAMOTIDINE 20 MG PO TABS
20.0000 mg | ORAL_TABLET | Freq: Every day | ORAL | Status: DC
  Administered 2024-03-21 – 2024-03-24 (×4): 20 mg via ORAL
  Filled 2024-03-20 (×4): qty 1

## 2024-03-20 MED ORDER — GLIPIZIDE 10 MG PO TABS
10.0000 mg | ORAL_TABLET | Freq: Every morning | ORAL | Status: DC
  Administered 2024-03-21: 10 mg via ORAL
  Filled 2024-03-20: qty 1

## 2024-03-20 MED ORDER — BISACODYL 5 MG PO TBEC
10.0000 mg | DELAYED_RELEASE_TABLET | Freq: Every day | ORAL | Status: DC | PRN
  Filled 2024-03-20: qty 2

## 2024-03-20 MED ORDER — TAMSULOSIN HCL 0.4 MG PO CAPS
0.4000 mg | ORAL_CAPSULE | Freq: Every day | ORAL | Status: DC
  Administered 2024-03-21 – 2024-03-24 (×4): 0.4 mg via ORAL
  Filled 2024-03-20 (×4): qty 1

## 2024-03-20 MED ORDER — ENOXAPARIN SODIUM 40 MG/0.4ML IJ SOSY
40.0000 mg | PREFILLED_SYRINGE | INTRAMUSCULAR | Status: DC
  Administered 2024-03-20 – 2024-03-23 (×4): 40 mg via SUBCUTANEOUS
  Filled 2024-03-20 (×4): qty 0.4

## 2024-03-20 MED ORDER — IOHEXOL 350 MG/ML SOLN
100.0000 mL | Freq: Once | INTRAVENOUS | Status: AC | PRN
  Administered 2024-03-20: 100 mL via INTRAVENOUS

## 2024-03-20 MED ORDER — IPRATROPIUM-ALBUTEROL 0.5-2.5 (3) MG/3ML IN SOLN: 3.0000 mL | Freq: Four times a day (QID) | RESPIRATORY_TRACT | Status: DC | PRN

## 2024-03-20 MED ORDER — NALOXONE HCL 0.4 MG/ML IJ SOLN: 0.4000 mg | INTRAMUSCULAR | Status: DC | PRN

## 2024-03-20 MED ORDER — HYDROMORPHONE HCL 1 MG/ML IJ SOLN
1.0000 mg | Freq: Once | INTRAMUSCULAR | Status: AC
  Administered 2024-03-20: 1 mg via INTRAVENOUS
  Filled 2024-03-20: qty 1

## 2024-03-20 MED ORDER — OXYCODONE-ACETAMINOPHEN 10-325 MG PO TABS: 1.0000 | ORAL_TABLET | Freq: Four times a day (QID) | ORAL | Status: DC | PRN

## 2024-03-20 MED ORDER — HYDROMORPHONE HCL 1 MG/ML IJ SOLN
1.0000 mg | INTRAMUSCULAR | Status: DC | PRN
  Administered 2024-03-20 – 2024-03-24 (×19): 1 mg via INTRAVENOUS
  Filled 2024-03-20 (×20): qty 1

## 2024-03-20 MED ORDER — TRAZODONE HCL 50 MG PO TABS
75.0000 mg | ORAL_TABLET | Freq: Every day | ORAL | Status: DC
  Administered 2024-03-20 – 2024-03-23 (×4): 75 mg via ORAL
  Filled 2024-03-20 (×4): qty 2

## 2024-03-20 MED ORDER — HYDROMORPHONE HCL 1 MG/ML IJ SOLN
0.5000 mg | Freq: Once | INTRAMUSCULAR | Status: AC
  Administered 2024-03-20: 0.5 mg via INTRAVENOUS
  Filled 2024-03-20: qty 1

## 2024-03-20 MED ORDER — HYDROMORPHONE HCL 1 MG/ML IJ SOLN: 1.0000 mg | INTRAMUSCULAR | Status: DC | PRN

## 2024-03-20 MED ORDER — SODIUM CHLORIDE 0.9% FLUSH: 3.0000 mL | INTRAVENOUS | Status: DC | PRN

## 2024-03-20 MED ORDER — INSULIN ASPART 100 UNIT/ML IJ SOLN: 0.0000 [IU] | Freq: Every day | INTRAMUSCULAR | Status: DC

## 2024-03-20 MED ORDER — POLYETHYLENE GLYCOL 3350 17 G PO PACK
17.0000 g | PACK | Freq: Every day | ORAL | Status: DC
  Administered 2024-03-21: 17 g via ORAL
  Filled 2024-03-20 (×3): qty 1

## 2024-03-20 MED ORDER — PIPERACILLIN-TAZOBACTAM 3.375 G IVPB 30 MIN
3.3750 g | Freq: Once | INTRAVENOUS | Status: AC
  Administered 2024-03-20: 3.375 g via INTRAVENOUS
  Filled 2024-03-20: qty 50

## 2024-03-20 MED ORDER — SODIUM CHLORIDE 0.9 % IV SOLN: 250.0000 mL | INTRAVENOUS | Status: AC | PRN

## 2024-03-20 MED ORDER — SODIUM CHLORIDE 0.9 % IV BOLUS
1000.0000 mL | Freq: Once | INTRAVENOUS | Status: AC
  Administered 2024-03-20: 1000 mL via INTRAVENOUS

## 2024-03-20 MED ORDER — PIPERACILLIN-TAZOBACTAM 3.375 G IVPB
3.3750 g | Freq: Three times a day (TID) | INTRAVENOUS | Status: DC
  Administered 2024-03-20 – 2024-03-24 (×12): 3.375 g via INTRAVENOUS
  Filled 2024-03-20 (×12): qty 50

## 2024-03-20 MED ORDER — OXYCODONE-ACETAMINOPHEN 5-325 MG PO TABS
1.0000 | ORAL_TABLET | Freq: Four times a day (QID) | ORAL | Status: DC | PRN
  Administered 2024-03-21 – 2024-03-24 (×12): 1 via ORAL
  Filled 2024-03-20 (×13): qty 1

## 2024-03-20 MED ORDER — DAPAGLIFLOZIN PROPANEDIOL 10 MG PO TABS
10.0000 mg | ORAL_TABLET | Freq: Every morning | ORAL | Status: DC
  Administered 2024-03-21: 10 mg via ORAL
  Filled 2024-03-20: qty 1

## 2024-03-20 MED ORDER — CYCLOBENZAPRINE HCL 5 MG PO TABS
5.0000 mg | ORAL_TABLET | Freq: Three times a day (TID) | ORAL | Status: DC | PRN
  Administered 2024-03-21 – 2024-03-24 (×3): 5 mg via ORAL
  Filled 2024-03-20 (×4): qty 1

## 2024-03-20 MED ORDER — SODIUM ZIRCONIUM CYCLOSILICATE 5 G PO PACK
5.0000 g | PACK | Freq: Once | ORAL | Status: AC
  Administered 2024-03-20: 5 g via ORAL
  Filled 2024-03-20: qty 1

## 2024-03-20 MED ORDER — ONDANSETRON HCL 4 MG PO TABS: 4.0000 mg | ORAL_TABLET | Freq: Four times a day (QID) | ORAL | Status: DC | PRN

## 2024-03-20 MED ORDER — SODIUM CHLORIDE 0.9% FLUSH
3.0000 mL | Freq: Two times a day (BID) | INTRAVENOUS | Status: DC
  Administered 2024-03-20 – 2024-03-24 (×4): 3 mL via INTRAVENOUS

## 2024-03-20 MED ORDER — LINACLOTIDE 145 MCG PO CAPS
145.0000 ug | ORAL_CAPSULE | Freq: Every day | ORAL | Status: DC
  Administered 2024-03-21 – 2024-03-24 (×4): 145 ug via ORAL
  Filled 2024-03-20 (×5): qty 1

## 2024-03-20 MED ORDER — OXYCODONE HCL 5 MG PO TABS
5.0000 mg | ORAL_TABLET | Freq: Four times a day (QID) | ORAL | Status: DC | PRN
  Administered 2024-03-21 – 2024-03-24 (×12): 5 mg via ORAL
  Filled 2024-03-20 (×13): qty 1

## 2024-03-20 MED ORDER — ATORVASTATIN CALCIUM 40 MG PO TABS
40.0000 mg | ORAL_TABLET | Freq: Every day | ORAL | Status: DC
  Administered 2024-03-20 – 2024-03-23 (×4): 40 mg via ORAL
  Filled 2024-03-20 (×4): qty 1

## 2024-03-20 MED ORDER — SENNOSIDES-DOCUSATE SODIUM 8.6-50 MG PO TABS
1.0000 | ORAL_TABLET | Freq: Every day | ORAL | Status: DC
  Administered 2024-03-20 – 2024-03-23 (×4): 1 via ORAL
  Filled 2024-03-20 (×4): qty 1

## 2024-03-20 MED ORDER — PREGABALIN 100 MG PO CAPS
100.0000 mg | ORAL_CAPSULE | Freq: Four times a day (QID) | ORAL | Status: DC
  Administered 2024-03-20 – 2024-03-24 (×16): 100 mg via ORAL
  Filled 2024-03-20 (×16): qty 1

## 2024-03-20 NOTE — ED Provider Triage Note (Signed)
 Emergency Medicine Provider Triage Evaluation Note  Brett White , a 63 y.o. male  was evaluated in triage.  Pt complains of severe excruciating right sided flank pain that started 5 days ago and has been worsening since then.  Constant and radiates everywhere.  Hurts to take deep breaths.  Associated with dysuria as well.  History of left BKA.  Also associated right leg swelling and nausea/vomiting with decreased PO intake.  No h/o renal stones.   Review of Systems  Positive: R flank pain, dysuria, pleuritic pain, nausea/vomiting Negative: Chest pain, shortness of breath  Physical Exam  BP 105/74   Pulse 87   Temp 98.1 F (36.7 C)   Resp 19   Ht 6' 2 (1.88 m)   Wt 120.7 kg   SpO2 96%   BMI 34.15 kg/m  Gen:   Awake, uncomfortable appearing Resp:  Normal effort MSK:   Moves extremities without difficulty  Back:   No midline C T or L spine TTP. +R CVA TTP.   Medical Decision Making  Medically screening exam initiated at 2:08 PM.  Appropriate orders placed.  Montae Stager was informed that the remainder of the evaluation will be completed by another provider, this initial triage assessment does not replace that evaluation, and the importance of remaining in the ED until their evaluation is complete.  Ordered labs, pain medicine.   Merdis Stalling, MD 03/20/24 [phone]

## 2024-03-20 NOTE — ED Notes (Signed)
 Pt to CT

## 2024-03-20 NOTE — ED Provider Notes (Signed)
 [place] EMERGENCY DEPARTMENT AT [facility] Provider Note   CSN: [id number] Arrival date & time: 03/20/24  1347     Patient presents with: Back Pain   Brett White is a 63 y.o. male.  He is currently a resident at [facility].  Complaining of right low back and flank pain that is been going on for a week.  Initially had some urinary frequency.  Had vomiting today x 1.  No abdominal pain.  Maybe feels a little short of breath.  Pain is constant does not matter if he is bending twisting or breathing.  Never had this before.  Was seen 4 days ago and treated for possible UTI with antibiotics.  CT renal at that time showing bilateral nonobstructing calculi and bilateral renal cysts.  Urine culture grew out greater than 100,000 E. coli ESBL.  Was treated with Keflex  which it is resistant to.  Continues to have some urinary symptoms.  No fever.   The history is provided by the patient.  Back Pain Location:  Lumbar spine Quality:  Aching Radiates to:  Does not radiate Pain severity:  Severe Pain is:  Same all the time Onset quality:  Gradual Duration:  1 week Timing:  Constant Progression:  Worsening Chronicity:  New Relieved by:  Nothing Worsened by:  Nothing Associated symptoms: dysuria   Associated symptoms: no abdominal pain, no bladder incontinence, no bowel incontinence, no chest pain, no fever, no numbness and no weakness        Prior to Admission medications   Medication Sig Start Date End Date Taking? Authorizing Provider  acetaminophen  (TYLENOL ) 325 MG tablet Take 2 tablets (650 mg total) by mouth every 6 (six) hours as needed for mild pain (or Fever >/= 101). 06/13/22   Jadine Toribio SQUIBB, MD  amLODipine  (NORVASC ) 10 MG tablet Take 10 mg by mouth every morning. 05/11/22   [provider]  atorvastatin  (LIPITOR) 40 MG tablet Take 40 mg by mouth at bedtime. 11/07/21   [provider]  bacitracin -polymyxin b  (POLYSPORIN ) ophthalmic ointment Place into  the right eye 4 (four) times daily. Place a 1/2 inch ribbon of ointment into the lower eyelid 4 a day and as needed for discomfort 08/01/23   Valdemar Rogue, MD  bisacodyl  (DULCOLAX) 5 MG EC tablet Take 10 mg by mouth daily as needed (CONSTIPATION).    [provider]  brimonidine  (ALPHAGAN ) 0.2 % ophthalmic solution Place 1 drop into the left eye in the morning and at bedtime. 08/29/23   [provider]  carboxymethylcellulose 1 % ophthalmic solution Place 4 drops into the left eye in the morning, at noon, in the evening, and at bedtime.    [provider]  cephALEXin  (KEFLEX ) 500 MG capsule Take 1 capsule (500 mg total) by mouth 3 (three) times daily. 03/16/24   Jarold Olam HERO, PA-C  cyclobenzaprine  (FLEXERIL ) 5 MG tablet Take 1 tablet (5 mg total) by mouth 3 (three) times daily as needed for muscle spasms. 06/13/22   Jadine Toribio SQUIBB, MD  DULoxetine  (CYMBALTA ) 60 MG capsule Take 60 mg by mouth daily. 08/28/23   [provider]  ergocalciferol  (VITAMIN D2) 1.25 MG (50000 UT) capsule Take 50,000 Units by mouth once a week. Fridays    [provider]  famotidine  (PEPCID ) 20 MG tablet Take 20 mg by mouth daily. 04/20/23   [provider]  FARXIGA  10 MG TABS tablet Take 10 mg by mouth every morning. Dapaglifozin 02/07/22   [provider]  furosemide  (LASIX ) 20 MG tablet Take 20 mg by mouth every morning. 05/11/22   [provider]  glipiZIDE  (GLUCOTROL ) 10 MG tablet Take 10 mg by mouth every morning. 05/11/22   [provider]  guaiFENesin  (ROBITUSSIN) 100 MG/5ML liquid Take 10 mLs by mouth every 4 (four) hours as needed for cough or to loosen phlegm.    [provider]  hydrocortisone cream 1 % Apply 1 Application topically daily. Apply to right lower leg for wound care Patient not taking: Reported on 11/20/2023    [provider]  hydrOXYzine  (ATARAX ) 10 MG tablet Take 1 tablet (10 mg total) by mouth 3  (three) times daily as needed for itching. Patient not taking: Reported on 11/20/2023 03/06/23   Zamora, Erin R, NP  insulin  lispro (HUMALOG) 100 UNIT/ML KwikPen Inject 2-8 Units into the skin 3 (three) times daily with meals. Sliding scale 201-250=2 units 251-300=4 units 301-350=6 units 351-400= 8 units 401 or greater than=call [facility] 04/18/23   [provider]  ipratropium-albuterol  (DUONEB) 0.5-2.5 (3) MG/3ML SOLN Take 3 mLs by nebulization every 4 (four) hours as needed. 11/26/23   Elgergawy, Brayton RAMAN, MD  ketorolac  (ACULAR ) 0.4 % SOLN Place 1 drop into the left eye 4 (four) times daily.    [provider]  LINZESS  145 MCG CAPS capsule Take 145 mcg by mouth daily. 06/11/23   [provider]  losartan  (COZAAR ) 50 MG tablet Take 1 tablet (50 mg total) by mouth daily. 02/24/22   Odell Celinda Balo, MD  magnesium  hydroxide (MILK OF MAGNESIA) 400 MG/5ML suspension Take 30 mLs by mouth daily as needed for mild constipation.    [provider]  Melatonin 5 MG CAPS Take 10 mg by mouth at bedtime.    [provider]  moxifloxacin (VIGAMOX) 0.5 % ophthalmic solution Place 1 drop into the left eye in the morning and at bedtime. For 7 days    [provider]  naloxone  (NARCAN ) nasal spray 4 mg/0.1 mL Place 1 spray into the nose See admin instructions. Every 2-3 minutesas needed until patient response/ emba arrived. 11/18/22   Krishnan, Gokul, MD  oxyCODONE -acetaminophen  (PERCOCET) 10-325 MG tablet Take 1 tablet by mouth every 4 (four) hours as needed for pain. 11/26/23   Elgergawy, Brayton RAMAN, MD  pantoprazole  (PROTONIX ) 40 MG tablet Take 1 tablet (40 mg total) by mouth daily. 10/20/21   Ghimire, Donalda HERO, MD  polyethylene glycol (MIRALAX  / GLYCOLAX ) 17 g packet Take 17 g by mouth daily. Patient taking differently: Take 17 g by mouth daily. With 4 oz of fluids and drink 11/18/22   Verdene Purchase, MD  Polyethylene Glycol 3350  POWD Take 17 g by mouth every  morning. Mix with 4 oz of fluids Patient not taking: Reported on 11/20/2023    [provider]  prednisoLONE  acetate (PRED FORTE ) 1 % ophthalmic suspension Place 1 drop into the left eye 4 (four) times daily. 11/26/23   Elgergawy, Brayton RAMAN, MD  pregabalin  (LYRICA ) 100 MG capsule Take 1 capsule (100 mg total) by mouth 2 (two) times daily. Patient taking differently: Take 100 mg by mouth 4 (four) times daily. 11/18/22   Krishnan, Gokul, MD  sertraline  (ZOLOFT ) 50 MG tablet Take 50 mg by mouth daily. 04/30/23   [provider]  SODIUM PHOSPHATES RE Place 1 application  rectally daily. As needed constipation 3 of 4    [provider]  traZODone  (DESYREL ) 150 MG tablet Take 75 mg by mouth at bedtime. 05/30/23  [provider]  TRULICITY  1.5 MG/0.5ML SOPN Inject 0.5 mg into the skin once a week. Thursdays 04/04/23   [provider]    Allergies: Patient has no known allergies.    Review of Systems  Constitutional:  Negative for fever.  HENT:  Negative for sore throat.   Respiratory:  Negative for shortness of breath.   Cardiovascular:  Negative for chest pain.  Gastrointestinal:  Positive for vomiting. Negative for abdominal pain and bowel incontinence.  Genitourinary:  Positive for dysuria and flank pain. Negative for bladder incontinence.  Musculoskeletal:  Positive for back pain.  Skin:  Negative for rash.  Neurological:  Negative for weakness and numbness.    Updated Vital Signs BP 105/74   Pulse 87   Temp 98.1 F (36.7 C)   Resp 19   Ht 6' 2 (1.88 m)   Wt 120.7 kg   SpO2 96%   BMI 34.15 kg/m   Physical Exam Vitals and nursing note reviewed.  Constitutional:      General: He is not in acute distress.    Appearance: Normal appearance. He is well-developed.  HENT:     Head: Normocephalic and atraumatic.   Eyes:     Conjunctiva/sclera: Conjunctivae normal.    Cardiovascular:     Rate and Rhythm: Normal rate and regular rhythm.      Heart sounds: No murmur heard. Pulmonary:     Effort: Pulmonary effort is normal. No respiratory distress.     Breath sounds: Normal breath sounds.  Abdominal:     Palpations: Abdomen is soft.     Tenderness: There is no abdominal tenderness.   Musculoskeletal:        General: No swelling.     Cervical back: Neck supple.     Comments: Left BKA   Skin:    General: Skin is warm and dry.     Capillary Refill: Capillary refill takes less than 2 seconds.   Neurological:     General: No focal deficit present.     Mental Status: He is alert.   Psychiatric:        Mood and Affect: Mood normal.     (all labs ordered are listed, but only abnormal results are displayed) Labs Reviewed  CBC WITH DIFFERENTIAL/PLATELET - Abnormal; Notable for the following components:      Result Value   Hemoglobin 12.9 (*)    MCV 79.2 (*)    MCH 23.9 (*)    RDW 17.2 (*)    Platelets 513 (*)    Abs Immature Granulocytes 0.16 (*)    All other components within normal limits  COMPREHENSIVE METABOLIC PANEL WITH GFR - Abnormal; Notable for the following components:   Sodium 134 (*)    Potassium 5.3 (*)    Glucose, Bld 171 (*)    BUN 24 (*)    Creatinine, Ser 1.77 (*)    ALT 56 (*)    GFR, Estimated 43 (*)    All other components within normal limits  URINALYSIS, W/ REFLEX TO CULTURE (INFECTION SUSPECTED)    EKG: EKG Interpretation Date/Time:  Friday March 20 2024 14:44:48 EDT Ventricular Rate:  88 PR Interval:  174 QRS Duration:  90 QT Interval:  342 QTC Calculation: 413 R Axis:   -31  Text Interpretation: Normal sinus rhythm Left axis deviation Low voltage QRS Abnormal ECG When compared with ECG of 15-Mar-2024 21:32, No significant change since last tracing Confirmed by Towana Sharper [phone]) on 03/20/2024 3:56:10 PM  Radiology:  CT Angio Chest/Abd/Pel for Dissection W and/or Wo Contrast Result Date: 03/20/2024 CLINICAL DATA:  Acute aortic syndrome suspected 63 year old male. Chest pain  EXAM: CT ANGIOGRAPHY CHEST, ABDOMEN AND PELVIS TECHNIQUE: Non-contrast CT of the chest was initially obtained. Multidetector CT imaging through the chest, abdomen and pelvis was performed using the standard protocol during bolus administration of intravenous contrast. Multiplanar reconstructed images and MIPs were obtained and reviewed to evaluate the vascular anatomy. RADIATION DOSE REDUCTION: This exam was performed according to the departmental dose-optimization program which includes automated exposure control, adjustment of the mA and/or kV according to patient size and/or use of iterative reconstruction technique. CONTRAST:  OMNIPAQUE  IOHEXOL  350 MG/ML SOLN COMPARISON:  None Available. FINDINGS: CTA CHEST FINDINGS Cardiovascular: Non IV contrast images demonstrate no intramural hematoma within the thoracic aorta. Contrast series demonstrates no aortic dissection or aneurysm. Great vessels are normal. No pericardial fluid. Mediastinum/Nodes: No axillary or supraclavicular adenopathy. No mediastinal or hilar adenopathy. No pericardial fluid. Esophagus normal. Lungs/Pleura: No pulmonary infarction. No pneumonia. No pleural fluid. No pneumothorax Musculoskeletal: No acute findings Review of the MIP images confirms the above findings. CTA ABDOMEN AND PELVIS FINDINGS VASCULAR Aorta: Normal caliber aorta without aneurysm, dissection, vasculitis or significant stenosis. Celiac: Patent without evidence of aneurysm, dissection, vasculitis or significant stenosis. SMA: Patent without evidence of aneurysm, dissection, vasculitis or significant stenosis. Renals: Both renal arteries are patent without evidence of aneurysm, dissection, vasculitis, fibromuscular dysplasia or significant stenosis. IMA: Patent without evidence of aneurysm, dissection, vasculitis or significant stenosis. Inflow: Patent without evidence of aneurysm, dissection, vasculitis or significant stenosis. Veins: No obvious venous abnormality within  the limitations of this arterial phase study. Review of the MIP images confirms the above findings. NON-VASCULAR Hepatobiliary: No focal hepatic lesion. Normal gallbladder. No biliary duct dilatation. Common bile duct is normal. Pancreas: Pancreas is normal. No ductal dilatation. No pancreatic inflammation. Spleen: Normal spleen Adrenals/urinary tract: Adrenal glands and kidneys are normal. Small exophytic cystic lesion extending from the lower pole of the RIGHT kidney measures 11 mm. On comparison CT 03/15/2024 this cyst was high-density on noncontrast imaging. No apparent enhancement on the postcontrast study. 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 Nonobstructing calculus in the RIGHT kidney. Ureters and bladder normal. Stomach/Bowel: The stomach, duodenum, and small bowel normal. The colon and rectosigmoid colon are normal. Vascular/Lymphatic: Abdominal aorta is normal caliber. No periportal or retroperitoneal adenopathy. No pelvic adenopathy. Reproductive: Unremarkable Other: No free fluid. Musculoskeletal: No aggressive osseous lesion. Review of the MIP images confirms the above findings. IMPRESSION: CHEST: 1. No evidence of aortic dissection or aneurysm. 2. No acute pulmonary findings. PELVIS: 1. No evidence of aortic dissection or aneurysm. 2. No acute findings in the abdomen pelvis. 3. Nonobstructing calculus in the RIGHT kidney. 4. 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 JACR 2018 Feb; 264-273, Management of the Incidental Renal Mass on CT, [employer] 2021; 814-848, Bosniak Classification of Cystic Renal Masses, Version 2019. Electronically Signed   By: Jackquline Boxer M.D.   On: 03/20/2024 19:59   DG Chest 2 View Result Date: 03/20/2024 CLINICAL DATA:  Right flank pain and chest pain. EXAM: CHEST - 2 VIEW COMPARISON:  03/15/2024 FINDINGS: Lateral view degraded by patient arm position. Midline trachea. Mild cardiomegaly.  Ascending aortic prominence is similar. No pleural effusion or pneumothorax. No congestive failure. Minimal subsegmental atelectasis or scarring at both lung bases. IMPRESSION: 1. No acute cardiopulmonary disease. 2. Similar ascending aortic prominence.  CTA is pending. Electronically  Signed   By: Rockey Kilts M.D.   On: 03/20/2024 17:41     Procedures   Medications Ordered in the ED  piperacillin -tazobactam (ZOSYN ) IVPB 3.375 g (0 g Intravenous Stopped 03/20/24 1915)    Followed by  piperacillin -tazobactam (ZOSYN ) IVPB 3.375 g (3.375 g Intravenous New Bag/Given 03/21/24 0554)  atorvastatin  (LIPITOR) tablet 40 mg (40 mg Oral Given 03/20/24 2342)  sertraline  (ZOLOFT ) tablet 50 mg (50 mg Oral Given 03/21/24 0822)  traZODone  (DESYREL ) tablet 75 mg (75 mg Oral Given 03/20/24 2341)  bisacodyl  (DULCOLAX) EC tablet 10 mg (has no administration in time range)  famotidine  (PEPCID ) tablet 20 mg (20 mg Oral Given 03/21/24 0820)  linaclotide  (LINZESS ) capsule 145 mcg (145 mcg Oral Given 03/21/24 0821)  polyethylene glycol (MIRALAX  / GLYCOLAX ) packet 17 g (17 g Oral Given 03/21/24 9177)  cyclobenzaprine  (FLEXERIL ) tablet 5 mg (has no administration in time range)  pregabalin  (LYRICA ) capsule 100 mg (100 mg Oral Given 03/21/24 0822)  ipratropium-albuterol  (DUONEB) 0.5-2.5 (3) MG/3ML nebulizer solution 3 mL (has no administration in time range)  enoxaparin  (LOVENOX ) injection 40 mg (40 mg Subcutaneous Given 03/20/24 2154)  sodium chloride  flush (NS) 0.9 % injection 3 mL (3 mLs Intravenous Not Given 03/21/24 0823)  lactated ringers  infusion ( Intravenous New Bag/Given 03/21/24 0827)  sodium chloride  flush (NS) 0.9 % injection 3 mL (3 mLs Intravenous Not Given 03/21/24 0823)  sodium chloride  flush (NS) 0.9 % injection 3 mL (has no administration in time range)  0.9 %  sodium chloride  infusion (has no administration in time range)  acetaminophen  (TYLENOL ) tablet 650 mg (has no administration in time range)    Or   acetaminophen  (TYLENOL ) suppository 650 mg (has no administration in time range)  ondansetron  (ZOFRAN ) tablet 4 mg (has no administration in time range)    Or  ondansetron  (ZOFRAN ) injection 4 mg (has no administration in time range)  insulin  aspart (novoLOG ) injection 0-6 Units (1 Units Subcutaneous Given 03/21/24 0821)  insulin  aspart (novoLOG ) injection 0-5 Units ( Subcutaneous Not Given 03/20/24 2200)  dapagliflozin  propanediol (FARXIGA ) tablet 10 mg (10 mg Oral Given 03/21/24 0821)  glipiZIDE  (GLUCOTROL ) tablet 10 mg (10 mg Oral Given 03/21/24 0819)  naloxone  (NARCAN ) injection 0.4 mg (has no administration in time range)  HYDROmorphone  (DILAUDID ) injection 1 mg (1 mg Intravenous Given 03/21/24 0837)  tamsulosin  (FLOMAX ) capsule 0.4 mg (has no administration in time range)  senna-docusate (Senokot-S) tablet 1 tablet (1 tablet Oral Given 03/20/24 2154)  oxyCODONE -acetaminophen  (PERCOCET/ROXICET) 5-325 MG per tablet 1 tablet (1 tablet Oral Given 03/21/24 0427)    And  oxyCODONE  (Oxy IR/ROXICODONE ) immediate release tablet 5 mg (5 mg Oral Given 03/21/24 0427)  HYDROmorphone  (DILAUDID ) injection 0.5 mg (0.5 mg Intravenous Given 03/20/24 1441)  ondansetron  (ZOFRAN ) injection 4 mg (4 mg Intravenous Given 03/20/24 1441)  sodium chloride  0.9 % bolus 1,000 mL (0 mLs Intravenous Stopped 03/20/24 1900)  HYDROmorphone  (DILAUDID ) injection 1 mg (1 mg Intravenous Given 03/20/24 1632)  iohexol  (OMNIPAQUE ) 350 MG/ML injection 100 mL (100 mLs Intravenous Contrast Given 03/20/24 1838)  HYDROmorphone  (DILAUDID ) injection 1 mg (1 mg Intramuscular Given 03/20/24 1746)  HYDROmorphone  (DILAUDID ) injection 1 mg (1 mg Intravenous Given 03/20/24 2046)  sodium zirconium cyclosilicate  (LOKELMA ) packet 5 g (5 g Oral Given 03/20/24 2153)    Clinical Course as of 03/21/24 0950  Fri Mar 20, 2024  1611 Chest x-ray interpreted by me as no acute infiltrate.  Awaiting radiology reading. [MB]  1611 His urine culture from 4 days ago  grew out ESBL E. coli.  He has been on Keflex .  It sounds like pharmacy checked in with the patient and did not feel he needed antibiotics and so did not make any adjustments.  He says they have not been helping.  Still endorses dysuria and pain. [MB]  1746 Patient's IV infiltrated prior to getting study.  IV team consulted. [MB]  2005 CT without acute findings that would explain patient's symptoms.  He is required multiple doses of Dilaudid  here.  With his recent positive urine culture feel he would benefit from mission to the hospital for further management. [MB]  2024 Discussed with [facility] hospitalist Dr. Sundil who will evaluate patient for admission. [MB]    Clinical Course User Index [MB] Towana Ozell BROCKS, MD                                 Medical Decision Making Risk Prescription drug management. Decision regarding hospitalization.   This patient complains of right-sided back and flank pain dysuria; this involves an extensive number of treatment Options and is a complaint that carries with it a high risk of complications and morbidity. The differential includes UTI, pyelonephritis, abscess, kidney stone, musculoskeletal pain, discitis  I ordered, reviewed and interpreted labs, which included CBC with normal white count, hemoglobin low but stable, chemistries with elevated creatinine, urinalysis without clear signs of infection I ordered medication IV fluids IV pain medicine IV antibiotics and reviewed PMP when indicated. I ordered imaging studies which included chest x-ray and CT angio chest abdomen and pelvis and I independently    visualized and interpreted imaging which showed kidney cyst, nonobstructing stones Previous records obtained and reviewed in epic including recent ED visit and urine culture I consulted [facility] hospitalist Dr. Lee and discussed lab and imaging findings and discussed disposition.  Cardiac monitoring reviewed, sinus rhythm Social determinants considered,  no significant barriers Critical Interventions: None  After the interventions stated above, I reevaluated the patient and found patient still to be in significant pain Admission and further testing considered, due to his ESBL urine culture as possible source of infection patient will benefit from mission to the hospital for IV antibiotics and further workup.  He is in agreement with plan for admission.      Final diagnoses:  ESBL (extended spectrum beta-lactamase) producing bacteria infection  Acute right-sided low back pain without sciatica  Acute right flank pain    ED Discharge Orders     None          Towana Ozell BROCKS, MD 03/21/24 [phone]

## 2024-03-20 NOTE — H&P (Addendum)
 History and Physical    Brett White ZOX:[id number] DOB: 1961/09/15 DOA: 03/20/2024  PCP: [facility]   Patient coming from: ALF   Chief Complaint:  Chief Complaint  Patient presents with   Back Pain   ED TRIAGE note:  Pt from [facility]. C/O lower right sided back pain. Pt has cyst on right kidney and UTI 4-5 days ago. Pt is on abx. VSS.axox4      HPI:  Brett White is a 63 y.o. male with medical history significant of essential hypertension, hyperlipidemia, generalized anxiety disorder, CKD stage II, IBS, chronic pain syndrome, AAA, DM type II, chronic transaminitis, hyperlipidemia,  left BKA and morbid obesity presented to emergency department complaining of right-sided low back pain and flank pain which has been ongoing for last 1 week.  Patient reported increased urinary frequency, vomited once.  Denies any abdominal pain.  Reported feeling little short of breath.  Patient reported approximate 4 days ago he has been treated for UTI.  CT renal at this time showed bilateral nonobstructing calculi and bilateral renal cyst.  Urine culture grew E. coli ESBL resistant to Keflex patient has been treated with empirically.  Patient continues to have UTI symptoms. While patient in the ED complaining about recurrent right-sided flank pain and due to the flank pain he cannot take deep breath required multiple doses of Dilaudid  while in the ED.  Patient has very poor pain tolerance given history of chronic pain at nursing home he takes Percocet every 4 hours alongside with gabapentin  and Flexeril . He is also endorsing increased urinary urgency. Patient denies any fever, chill, nausea, vomiting constipation and diarrhea. Also concerned he has right lower extremity swelling. Denies chest pain and palpitation.  ED Course:  At presentation to ED patient is hemodynamically stable except blood pressure is borderline soft. UA showing elevated blood glucose no persistence bacteremia  negative leukocyte esterase and nitrate.  WBC within normal range. CBC showing slightly low sodium 134 elevated potassium 5.3 elevated creatinine 1.77 elevated AST 56. CBC unremarkable.  Elevated platelet count 513.  As patient was complaining about some short of breath CT angio chest abdomen pelvis has been obtained which showed no evidence of aortic dissection, aneurysm.  No acute finding of the abdomen. 3. Nonobstructing calculus in the RIGHT kidney. 4. 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 JACR 2018 Feb; 264-273, Management of the Incidental Renal Mass on CT, [employer] 2021; 814-848, Bosniak Classification of Cystic Renal Masses, Version 2019.  Chest x-ray unremarkable.  Urine culture result following:   In the ED patient has been given Dilaudid  x 4 doses, Zofran , and Zosyn  being started.  Also 1 L of LR bolus.  Hospitalist has been consulted for further evaluation management of complicated UTI and acute kidney injury on CKD stage II  Significant labs in the ED: Lab Orders         CBC with Differential         Comprehensive metabolic panel         Urinalysis, w/ Reflex to Culture (Infection Suspected) -Urine, Clean Catch         CBC         Comprehensive metabolic panel       Review of Systems:  Review of Systems  Constitutional:  Negative for chills, fever, malaise/fatigue and weight loss.  Respiratory:  Positive for shortness of breath. Negative for cough, hemoptysis, sputum production and wheezing.   Cardiovascular:  Negative for chest pain, palpitations,  claudication and leg swelling.  Gastrointestinal:  Negative for abdominal pain, blood in stool, constipation, diarrhea, heartburn, nausea and vomiting.  Genitourinary:  Positive for flank pain and frequency. Negative for dysuria, hematuria and urgency.  Musculoskeletal:  Negative for back pain, falls, joint pain, myalgias and neck pain.  Neurological:  Negative for  dizziness and headaches.  Psychiatric/Behavioral:  The patient is nervous/anxious.     Past Medical History:  Diagnosis Date   CAP (community acquired pneumonia) 12/09/2021   CKD (chronic kidney disease)    GERD (gastroesophageal reflux disease)    HTN (hypertension)    Insulin  dependent type 2 diabetes mellitus (HCC)    Neuropathy    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   Stroke [facility])     Past Surgical History:  Procedure Laterality Date   ABDOMINAL AORTOGRAM W/LOWER EXTREMITY N/A 06/20/2023   Procedure: ABDOMINAL AORTOGRAM W/LOWER EXTREMITY;  Surgeon: Young Hensen, MD;  Location: [facility] INVASIVE CV LAB;  Service: Cardiovascular;  Laterality: N/A;   AMPUTATION Left 02/04/2021   Procedure: LEFT GREAT TOE AMPUTATION;  Surgeon: Timothy Ford, MD;  Location: [facility] OR;  Service: Orthopedics;  Laterality: Left;   AMPUTATION Left 02/10/2021   Procedure: LEFT FOOT 1ST RAY AMPUTATION;  Surgeon: Timothy Ford, MD;  Location: [facility] OR;  Service: Orthopedics;  Laterality: Left;   AMPUTATION Left 03/22/2021   Procedure: LEFT BELOW KNEE AMPUTATION;  Surgeon: Timothy Ford, MD;  Location: [facility] OR;  Service: Orthopedics;  Laterality: Left;   APPENDECTOMY     BACK SURGERY     CATARACT EXTRACTION     I & D EXTREMITY Left 03/03/2021   Procedure: LEFT FOOT DEBRIDEMENT;  Surgeon: Timothy Ford, MD;  Location: [facility] OR;  Service: Orthopedics;  Laterality: Left;   INCISION AND DRAINAGE ABSCESS N/A 10/06/2021   Procedure: INCISION AND DRAINAGE BACK ABSCESS;  Surgeon: Adalberto Acton, MD;  Location: [facility] OR;  Service: General;  Laterality: N/A;   INJECTION OF SILICONE OIL Left 07/25/2023   Procedure: INJECTION OF SILICONE OIL;  Surgeon: Ronelle Coffee, MD;  Location: [facility] OR;  Service: Ophthalmology;  Laterality: Left;   MEMBRANE PEEL Left 07/25/2023   Procedure: MEMBRANE PEEL;  Surgeon: Ronelle Coffee, MD;  Location: [facility] OR;  Service: Ophthalmology;  Laterality: Left;   PARS PLANA  VITRECTOMY Left 07/25/2023   Procedure: PARS PLANA VITRECTOMY WITH 25 GAUGE;  Surgeon: Ronelle Coffee, MD;  Location: [facility] OR;  Service: Ophthalmology;  Laterality: Left;   removal of back cyst     TONSILLECTOMY       reports that he has never smoked. He has never used smokeless tobacco. He reports that he does not currently use alcohol . He reports that he does not currently use drugs.  No Known Allergies  Family History  Problem Relation Age of Onset   Diabetes Mother    Heart attack Neg Hx     Prior to Admission medications   Medication Sig Start Date End Date Taking? Authorizing Provider  acetaminophen  (TYLENOL ) 325 MG tablet Take 2 tablets (650 mg total) by mouth every 6 (six) hours as needed for mild pain (or Fever >/= 101). 06/13/22   Lonita Roach, MD  amLODipine  (NORVASC ) 10 MG tablet Take 10 mg by mouth every morning. 05/11/22   [provider]  atorvastatin  (LIPITOR) 40 MG tablet Take 40 mg by mouth at bedtime. 11/07/21   [provider]  bacitracin -polymyxin b  (POLYSPORIN ) ophthalmic ointment Place into the right eye 4 (  four) times daily. Place a 1/2 inch ribbon of ointment into the lower eyelid 4 a day and as needed for discomfort 08/01/23   Ronelle Coffee, MD  bisacodyl  (DULCOLAX) 5 MG EC tablet Take 10 mg by mouth daily as needed (CONSTIPATION).    [provider]  brimonidine  (ALPHAGAN ) 0.2 % ophthalmic solution Place 1 drop into the left eye in the morning and at bedtime. 08/29/23   [provider]  carboxymethylcellulose 1 % ophthalmic solution Place 4 drops into the left eye in the morning, at noon, in the evening, and at bedtime.    [provider]  cephALEXin (KEFLEX) 500 MG capsule Take 1 capsule (500 mg total) by mouth 3 (three) times daily. 03/16/24   Coretha Dew, PA-C  cyclobenzaprine  (FLEXERIL ) 5 MG tablet Take 1 tablet (5 mg total) by mouth 3 (three) times daily as needed for muscle spasms. 06/13/22   Lonita Roach,  MD  DULoxetine  (CYMBALTA ) 60 MG capsule Take 60 mg by mouth daily. 08/28/23   [provider]  ergocalciferol  (VITAMIN D2) 1.25 MG (50000 UT) capsule Take 50,000 Units by mouth once a week. Fridays    [provider]  famotidine  (PEPCID ) 20 MG tablet Take 20 mg by mouth daily. 04/20/23   [provider]  FARXIGA  10 MG TABS tablet Take 10 mg by mouth every morning. Dapaglifozin 02/07/22   [provider]  furosemide  (LASIX ) 20 MG tablet Take 20 mg by mouth every morning. 05/11/22   [provider]  glipiZIDE  (GLUCOTROL ) 10 MG tablet Take 10 mg by mouth every morning. 05/11/22   [provider]  guaiFENesin  (ROBITUSSIN) 100 MG/5ML liquid Take 10 mLs by mouth every 4 (four) hours as needed for cough or to loosen phlegm.    [provider]  hydrocortisone cream 1 % Apply 1 Application topically daily. Apply to right lower leg for wound care Patient not taking: Reported on 11/20/2023    [provider]  hydrOXYzine  (ATARAX ) 10 MG tablet Take 1 tablet (10 mg total) by mouth 3 (three) times daily as needed for itching. Patient not taking: Reported on 11/20/2023 03/06/23   Zamora, Erin R, NP  insulin  lispro (HUMALOG) 100 UNIT/ML KwikPen Inject 2-8 Units into the skin 3 (three) times daily with meals. Sliding scale 201-250=2 units 251-300=4 units 301-350=6 units 351-400= 8 units 401 or greater than=call [facility] 04/18/23   [provider]  ipratropium-albuterol  (DUONEB) 0.5-2.5 (3) MG/3ML SOLN Take 3 mLs by nebulization every 4 (four) hours as needed. 11/26/23   Elgergawy, Ardia Kraft, MD  ketorolac  (ACULAR ) 0.4 % SOLN Place 1 drop into the left eye 4 (four) times daily.    [provider]  LINZESS  145 MCG CAPS capsule Take 145 mcg by mouth daily. 06/11/23   [provider]  losartan  (COZAAR ) 50 MG tablet Take 1 tablet (50 mg total) by mouth daily. 02/24/22   Macdonald Savoy, MD  magnesium  hydroxide (MILK OF MAGNESIA)  400 MG/5ML suspension Take 30 mLs by mouth daily as needed for mild constipation.    [provider]  Melatonin 5 MG CAPS Take 10 mg by mouth at bedtime.    [provider]  moxifloxacin (VIGAMOX) 0.5 % ophthalmic solution Place 1 drop into the left eye in the morning and at bedtime. For 7 days    [provider]  naloxone  (NARCAN ) nasal spray 4 mg/0.1 mL Place 1 spray into the nose See admin instructions. Every 2-3 minutesas needed until patient  response/ emba arrived. 11/18/22   Krishnan, Gokul, MD  oxyCODONE -acetaminophen  (PERCOCET) 10-325 MG tablet Take 1 tablet by mouth every 4 (four) hours as needed for pain. 11/26/23   Elgergawy, Ardia Kraft, MD  pantoprazole  (PROTONIX ) 40 MG tablet Take 1 tablet (40 mg total) by mouth daily. 10/20/21   Ghimire, Estil Heman, MD  polyethylene glycol (MIRALAX  / GLYCOLAX ) 17 g packet Take 17 g by mouth daily. Patient taking differently: Take 17 g by mouth daily. With 4 oz of fluids and drink 11/18/22   Maylene Spear, MD  Polyethylene Glycol 3350  POWD Take 17 g by mouth every morning. Mix with 4 oz of fluids Patient not taking: Reported on 11/20/2023    [provider]  prednisoLONE  acetate (PRED FORTE ) 1 % ophthalmic suspension Place 1 drop into the left eye 4 (four) times daily. 11/26/23   Elgergawy, Ardia Kraft, MD  pregabalin  (LYRICA ) 100 MG capsule Take 1 capsule (100 mg total) by mouth 2 (two) times daily. Patient taking differently: Take 100 mg by mouth 4 (four) times daily. 11/18/22   Krishnan, Gokul, MD  sertraline  (ZOLOFT ) 50 MG tablet Take 50 mg by mouth daily. 04/30/23   [provider]  SODIUM PHOSPHATES RE Place 1 application  rectally daily. As needed constipation 3 of 4    [provider]  traZODone  (DESYREL ) 150 MG tablet Take 75 mg by mouth at bedtime. 05/30/23   [provider]  TRULICITY  1.5 MG/0.5ML SOPN Inject 0.5 mg into the skin once a week. Thursdays 04/04/23   [provider]      Physical Exam: Vitals:   03/20/24 1730 03/20/24 1820 03/20/24 2000 03/20/24 2015  BP: 130/72  (!) 146/77   Pulse: 80  (!) 59   Resp: 15   19  Temp:  97.8 F (36.6 C)    TempSrc:  Oral    SpO2: 97%  93%   Weight:      Height:        Physical Exam Vitals and nursing note reviewed.  Constitutional:      Appearance: He is ill-appearing.  HENT:     Mouth/Throat:     Mouth: Mucous membranes are dry.   Eyes:     Pupils: Pupils are equal, round, and reactive to light.    Cardiovascular:     Rate and Rhythm: Normal rate and regular rhythm.     Pulses: Normal pulses.     Heart sounds: Normal heart sounds.  Pulmonary:     Effort: Pulmonary effort is normal.     Breath sounds: Normal breath sounds. No wheezing.  Abdominal:     Palpations: Abdomen is soft.     Tenderness: There is no right CVA tenderness or left CVA tenderness.   Musculoskeletal:     Cervical back: Neck supple.     Right lower leg: No edema.   Skin:    Capillary Refill: Capillary refill takes less than 2 seconds.     Findings: No rash.   Neurological:     Mental Status: He is alert and oriented to person, place, and time.   Psychiatric:        Mood and Affect: Mood is anxious.        Speech: Speech normal.        Behavior: Behavior normal. Behavior is cooperative.        Thought Content: Thought content normal.        Cognition and Memory: Cognition and memory normal.  Judgment: Judgment normal.      Labs on Admission: I have personally reviewed following labs and imaging studies  CBC: Recent Labs  Lab 03/15/24 2137 03/20/24 1437  WBC 7.2 8.3  NEUTROABS 6.1 6.1  HGB 10.9* 12.9*  HCT 36.2* 42.7  MCV 80.1 79.2*  PLT 341 513*   Basic Metabolic Panel: Recent Labs  Lab 03/15/24 2137 03/20/24 1437  NA 136 134*  K 5.0 5.3*  CL 101 98  CO2 22 26  GLUCOSE 375* 171*  BUN 25* 24*  CREATININE 1.48* 1.77*  CALCIUM  9.1 9.5   GFR: Estimated Creatinine Clearance: 61.3 mL/min (A)  (by C-G formula based on SCr of 1.77 mg/dL (H)). Liver Function Tests: Recent Labs  Lab 03/15/24 2137 03/20/24 1437  AST 76* 41  ALT 65* 56*  ALKPHOS 101 101  BILITOT 0.4 0.4  PROT 7.6 7.6  ALBUMIN  3.3* 3.6   No results for input(s): LIPASE, AMYLASE in the last 168 hours. No results for input(s): AMMONIA in the last 168 hours. Coagulation Profile: No results for input(s): INR, PROTIME in the last 168 hours. Cardiac Enzymes: No results for input(s): CKTOTAL, CKMB, CKMBINDEX, TROPONINI, TROPONINIHS in the last 168 hours. BNP (last 3 results) No results for input(s): BNP in the last 8760 hours. HbA1C: No results for input(s): HGBA1C in the last 72 hours. CBG: No results for input(s): GLUCAP in the last 168 hours. Lipid Profile: No results for input(s): CHOL, HDL, LDLCALC, TRIG, CHOLHDL, LDLDIRECT in the last 72 hours. Thyroid Function Tests: No results for input(s): TSH, T4TOTAL, FREET4, T3FREE, THYROIDAB in the last 72 hours. Anemia Panel: No results for input(s): VITAMINB12, FOLATE, FERRITIN, TIBC, IRON, RETICCTPCT in the last 72 hours. Urine analysis:    Component Value Date/Time   COLORURINE YELLOW 03/20/2024 1716   APPEARANCEUR CLEAR 03/20/2024 1716   LABSPEC 1.016 03/20/2024 1716   PHURINE 5.0 03/20/2024 1716   GLUCOSEU >=500 (A) 03/20/2024 1716   HGBUR NEGATIVE 03/20/2024 1716   BILIRUBINUR NEGATIVE 03/20/2024 1716   KETONESUR NEGATIVE 03/20/2024 1716   PROTEINUR NEGATIVE 03/20/2024 1716   NITRITE NEGATIVE 03/20/2024 1716   LEUKOCYTESUR NEGATIVE 03/20/2024 1716    Radiological Exams on Admission: I have personally reviewed images CT Angio Chest/Abd/Pel for Dissection W and/or Wo Contrast Result Date: 03/20/2024 CLINICAL DATA:  Acute aortic syndrome suspected 63 year old male. Chest pain EXAM: CT ANGIOGRAPHY CHEST, ABDOMEN AND PELVIS TECHNIQUE: Non-contrast CT of the chest was initially obtained.  Multidetector CT imaging through the chest, abdomen and pelvis was performed using the standard protocol during bolus administration of intravenous contrast. Multiplanar reconstructed images and MIPs were obtained and reviewed to evaluate the vascular anatomy. RADIATION DOSE REDUCTION: This exam was performed according to the departmental dose-optimization program which includes automated exposure control, adjustment of the mA and/or kV according to patient size and/or use of iterative reconstruction technique. CONTRAST:  OMNIPAQUE  IOHEXOL  350 MG/ML SOLN COMPARISON:  None Available. FINDINGS: CTA CHEST FINDINGS Cardiovascular: Non IV contrast images demonstrate no intramural hematoma within the thoracic aorta. Contrast series demonstrates no aortic dissection or aneurysm. Great vessels are normal. No pericardial fluid. Mediastinum/Nodes: No axillary or supraclavicular adenopathy. No mediastinal or hilar adenopathy. No pericardial fluid. Esophagus normal. Lungs/Pleura: No pulmonary infarction. No pneumonia. No pleural fluid. No pneumothorax Musculoskeletal: No acute findings Review of the MIP images confirms the above findings. CTA ABDOMEN AND PELVIS FINDINGS VASCULAR Aorta: Normal caliber aorta without aneurysm, dissection, vasculitis or significant stenosis. Celiac: Patent without evidence of aneurysm, dissection, vasculitis or significant  stenosis. SMA: Patent without evidence of aneurysm, dissection, vasculitis or significant stenosis. Renals: Both renal arteries are patent without evidence of aneurysm, dissection, vasculitis, fibromuscular dysplasia or significant stenosis. IMA: Patent without evidence of aneurysm, dissection, vasculitis or significant stenosis. Inflow: Patent without evidence of aneurysm, dissection, vasculitis or significant stenosis. Veins: No obvious venous abnormality within the limitations of this arterial phase study. Review of the MIP images confirms the above findings.  NON-VASCULAR Hepatobiliary: No focal hepatic lesion. Normal gallbladder. No biliary duct dilatation. Common bile duct is normal. Pancreas: Pancreas is normal. No ductal dilatation. No pancreatic inflammation. Spleen: Normal spleen Adrenals/urinary tract: Adrenal glands and kidneys are normal. Small exophytic cystic lesion extending from the lower pole of the RIGHT kidney measures 11 mm. On comparison CT 03/15/2024 this cyst was high-density on noncontrast imaging. No apparent enhancement on the postcontrast study. 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 Nonobstructing calculus in the RIGHT kidney. Ureters and bladder normal. Stomach/Bowel: The stomach, duodenum, and small bowel normal. The colon and rectosigmoid colon are normal. Vascular/Lymphatic: Abdominal aorta is normal caliber. No periportal or retroperitoneal adenopathy. No pelvic adenopathy. Reproductive: Unremarkable Other: No free fluid. Musculoskeletal: No aggressive osseous lesion. Review of the MIP images confirms the above findings. IMPRESSION: CHEST: 1. No evidence of aortic dissection or aneurysm. 2. No acute pulmonary findings. PELVIS: 1. No evidence of aortic dissection or aneurysm. 2. No acute findings in the abdomen pelvis. 3. Nonobstructing calculus in the RIGHT kidney. 4. 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 JACR 2018 Feb; 264-273, Management of the Incidental Renal Mass on CT, [employer] 2021; 814-848, Bosniak Classification of Cystic Renal Masses, Version 2019. Electronically Signed   By: Deboraha Fallow M.D.   On: 03/20/2024 19:59   DG Chest 2 View Result Date: 03/20/2024 CLINICAL DATA:  Right flank pain and chest pain. EXAM: CHEST - 2 VIEW COMPARISON:  03/15/2024 FINDINGS: Lateral view degraded by patient arm position. Midline trachea. Mild cardiomegaly. Ascending aortic prominence is similar. No pleural effusion or pneumothorax. No congestive failure.  Minimal subsegmental atelectasis or scarring at both lung bases. IMPRESSION: 1. No acute cardiopulmonary disease. 2. Similar ascending aortic prominence.  CTA is pending. Electronically Signed   By: Lore Rode M.D.   On: 03/20/2024 17:41     EKG: My personal interpretation of EKG shows: EKG showed normal sinus rhythm heart rate 88.  Normal QTc.  There is no ST and T abnormality    Assessment/Plan: Principal Problem:   Acute cystitis Active Problems:   Acute kidney injury superimposed on chronic kidney disease (HCC)   Hyperkalemia   Insulin  dependent type 2 diabetes mellitus (HCC)   Essential hypertension   Diabetic polyneuropathy associated with type 2 diabetes mellitus (HCC)   S/P BKA (below knee amputation) unilateral, left (HCC)   GERD without esophagitis   Generalized anxiety disorder   Chronic pain syndrome   History of abdominal aortic aneurysm (AAA)   HLD (hyperlipidemia)   Narcotic dependence (HCC)   Peripheral neuropathy   Reactive airway disease   Insomnia    Assessment and Plan: Acute cystitis ESBL E. coli associated cystitis Right-sided nonobstructing nephrolithiasis Right-sided renal cyst -Patient presenting with persistent symptom of UTI.  Has been seen in the ED 6/15 days ago UTI.  Urine culture growing ESBL E. Colil.  Patient is complaining about significant right-sided flank pain required Dilaudid  1 mg x 4 doses still complaining about pain. -CT chest abdomen pelvis showed nonobstructing right kidney calculus  and renal cyst.  No evidence of pyonephritis. - Due to significant pain and patient is coming from nursing home hospitalist has been requested for admission for management of pain and acute cystitis and need for antibiotic. - At presentation to ED patient is afebrile, CBC no evidence of leukocytosis.  UA evidence of UTI. - Continue IV Zosyn  based on urine culture result. - Continue maintenance fluid 100 cc/h and starting Flomax.  Acute kidney injury  superimposed CKD stage II -Elevated creatinine 1.7.  Prerenal renal acute kidney injury in the setting of soft blood pressure, acute cystitis and renal stone. - 1 L of LR bolus given in ED.  Continue maintenance fluid LR 100 cc/h.  Hyperkalemia -Elevated potassium 5.3.  Giving Lokelma 5 g one-time dose.  Insulin -dependent DM type II -Continue Jardiance, glipizide , sliding scale insulin  with mealtime coverage.  Chronic diabetic polyneuropathy Chronic pain syndrome Chronic opioid dependence and tolerance -At home patient takes Percocet every 4 hours.  Has poor pain tolerance and opioid dependence. In the ED required 4 doses of Dilaudid  to control pain. -Continue Tylenol , Percocet as needed and Dilaudid  as needed. -Continue gabapentin  3 mg 4 times daily. -Continue Flexeril  as needed  History of left-sided BKA.  Generalized anxiety disorder -Continue Zoloft   Hyperlipidemia -Continue Lipitor  Insomnia -Continue trazodone   Reactive airway disease -Continue DuoNeb as needed  Essential hypertension - Due to underlying soft blood pressure holding home blood pressure regimen.  Continue maintenance fluid.  IBS - Continue Linzess   Chronic constipation - Continue MiraLAX  and Senokot  GERD - Continue famotidine   DVT prophylaxis:  Lovenox  Code Status:  Full Code Diet: Heart healthy carb modified Disposition Plan: Telemetry transition to oral antibiotic and sent back to nursing home tomorrow once right-sided flank pain will be well-controlled. Consults: Transition care team Admission status:   Observation, Med-Surg  Severity of Illness: The appropriate patient status for this patient is OBSERVATION. Observation status is judged to be reasonable and necessary in order to provide the required intensity of service to ensure the patient's safety. The patient's presenting symptoms, physical exam findings, and initial radiographic and laboratory data in the context of their medical  condition is felt to place them at decreased risk for further clinical deterioration. Furthermore, it is anticipated that the patient will be medically stable for discharge from the hospital within 2 midnights of admission.     Brett Birkland, MD [facility]  How to contact the [facility] Attending or Consulting provider 7A - 7P or covering provider during after hours 7P -7A, for this patient.  Check the care team in [facility] and look for a) attending/consulting [facility] provider listed and b) the [facility] team listed Log into [email or web address] and use [place]'s universal password to access. If you do not have the password, please contact the hospital operator. Locate the [facility] provider you are looking for under [facility] and page to a number that you can be directly reached. If you still have difficulty reaching the provider, please page the [facility] (Director on Call) for the Hospitalists listed on amion for assistance.  03/20/2024, 8:56 PM

## 2024-03-20 NOTE — ED Triage Notes (Signed)
 Pt from [place]. C/O lower right sided back pain. Pt has cyst on right kidney and UTI 4-5 days ago. Pt is on abx. VSS.[email or web address]

## 2024-03-21 DIAGNOSIS — N390 Urinary tract infection, site not specified: Secondary | ICD-10-CM | POA: Diagnosis not present

## 2024-03-21 DIAGNOSIS — E1142 Type 2 diabetes mellitus with diabetic polyneuropathy: Secondary | ICD-10-CM | POA: Diagnosis present

## 2024-03-21 DIAGNOSIS — Z7985 Long-term (current) use of injectable non-insulin antidiabetic drugs: Secondary | ICD-10-CM | POA: Diagnosis not present

## 2024-03-21 DIAGNOSIS — Z833 Family history of diabetes mellitus: Secondary | ICD-10-CM | POA: Diagnosis not present

## 2024-03-21 DIAGNOSIS — E1122 Type 2 diabetes mellitus with diabetic chronic kidney disease: Secondary | ICD-10-CM | POA: Diagnosis present

## 2024-03-21 DIAGNOSIS — G4700 Insomnia, unspecified: Secondary | ICD-10-CM | POA: Diagnosis present

## 2024-03-21 DIAGNOSIS — K219 Gastro-esophageal reflux disease without esophagitis: Secondary | ICD-10-CM | POA: Diagnosis present

## 2024-03-21 DIAGNOSIS — Z6834 Body mass index (BMI) 34.0-34.9, adult: Secondary | ICD-10-CM | POA: Diagnosis not present

## 2024-03-21 DIAGNOSIS — N179 Acute kidney failure, unspecified: Secondary | ICD-10-CM | POA: Diagnosis present

## 2024-03-21 DIAGNOSIS — Z89512 Acquired absence of left leg below knee: Secondary | ICD-10-CM | POA: Diagnosis not present

## 2024-03-21 DIAGNOSIS — E66811 Obesity, class 1: Secondary | ICD-10-CM | POA: Diagnosis present

## 2024-03-21 DIAGNOSIS — F411 Generalized anxiety disorder: Secondary | ICD-10-CM | POA: Diagnosis present

## 2024-03-21 DIAGNOSIS — J45909 Unspecified asthma, uncomplicated: Secondary | ICD-10-CM | POA: Diagnosis present

## 2024-03-21 DIAGNOSIS — Z1612 Extended spectrum beta lactamase (ESBL) resistance: Secondary | ICD-10-CM | POA: Diagnosis present

## 2024-03-21 DIAGNOSIS — I129 Hypertensive chronic kidney disease with stage 1 through stage 4 chronic kidney disease, or unspecified chronic kidney disease: Secondary | ICD-10-CM | POA: Diagnosis present

## 2024-03-21 DIAGNOSIS — B9629 Other Escherichia coli [E. coli] as the cause of diseases classified elsewhere: Secondary | ICD-10-CM

## 2024-03-21 DIAGNOSIS — Z79899 Other long term (current) drug therapy: Secondary | ICD-10-CM | POA: Diagnosis not present

## 2024-03-21 DIAGNOSIS — Z79891 Long term (current) use of opiate analgesic: Secondary | ICD-10-CM | POA: Diagnosis not present

## 2024-03-21 DIAGNOSIS — G894 Chronic pain syndrome: Secondary | ICD-10-CM | POA: Diagnosis present

## 2024-03-21 DIAGNOSIS — E785 Hyperlipidemia, unspecified: Secondary | ICD-10-CM | POA: Diagnosis present

## 2024-03-21 DIAGNOSIS — E875 Hyperkalemia: Secondary | ICD-10-CM | POA: Diagnosis present

## 2024-03-21 DIAGNOSIS — N3000 Acute cystitis without hematuria: Secondary | ICD-10-CM | POA: Diagnosis present

## 2024-03-21 DIAGNOSIS — N182 Chronic kidney disease, stage 2 (mild): Secondary | ICD-10-CM | POA: Diagnosis present

## 2024-03-21 DIAGNOSIS — E1165 Type 2 diabetes mellitus with hyperglycemia: Secondary | ICD-10-CM | POA: Diagnosis present

## 2024-03-21 DIAGNOSIS — Z794 Long term (current) use of insulin: Secondary | ICD-10-CM | POA: Diagnosis not present

## 2024-03-21 DIAGNOSIS — B9620 Unspecified Escherichia coli [E. coli] as the cause of diseases classified elsewhere: Secondary | ICD-10-CM | POA: Diagnosis present

## 2024-03-21 LAB — COMPREHENSIVE METABOLIC PANEL WITH GFR
ALT: 43 U/L (ref 0–44)
AST: 36 U/L (ref 15–41)
Albumin: 3 g/dL — ABNORMAL LOW (ref 3.5–5.0)
Alkaline Phosphatase: 89 U/L (ref 38–126)
Anion gap: 5 (ref 5–15)
BUN: 20 mg/dL (ref 6–20)
CO2: 27 mmol/L (ref 22–32)
Calcium: 8.5 mg/dL — ABNORMAL LOW (ref 8.9–10.3)
Chloride: 104 mmol/L (ref 98–111)
Creatinine, Ser: 1.53 mg/dL — ABNORMAL HIGH (ref 0.61–1.24)
GFR, Estimated: 52 mL/min — ABNORMAL LOW (ref >60)
Glucose, Bld: 136 mg/dL — ABNORMAL HIGH (ref 70–99)
Potassium: 5 mmol/L (ref 3.5–5.1)
Sodium: 136 mmol/L (ref 135–145)
Total Bilirubin: 0.5 mg/dL (ref 0.0–1.2)
Total Protein: 6.3 g/dL — ABNORMAL LOW (ref 6.5–8.1)

## 2024-03-21 LAB — GLUCOSE, CAPILLARY
Glucose-Capillary: 151 mg/dL — ABNORMAL HIGH (ref 70–99)
Glucose-Capillary: 152 mg/dL — ABNORMAL HIGH (ref 70–99)
Glucose-Capillary: 165 mg/dL — ABNORMAL HIGH (ref 70–99)
Glucose-Capillary: 128 mg/dL — ABNORMAL HIGH (ref 70–99)

## 2024-03-21 LAB — CBC
HCT: 38.2 % — ABNORMAL LOW (ref 39.0–52.0)
Hemoglobin: 11.5 g/dL — ABNORMAL LOW (ref 13.0–17.0)
MCH: 24.1 pg — ABNORMAL LOW (ref 26.0–34.0)
MCHC: 30.1 g/dL (ref 30.0–36.0)
MCV: 79.9 fL — ABNORMAL LOW (ref 80.0–100.0)
Platelets: 387 10*3/uL (ref 150–400)
RBC: 4.78 MIL/uL (ref 4.22–5.81)
RDW: 17.2 % — ABNORMAL HIGH (ref 11.5–15.5)
WBC: 8.7 10*3/uL (ref 4.0–10.5)
nRBC: 0 % (ref 0.0–0.2)

## 2024-03-21 MED ORDER — LACTATED RINGERS IV SOLN: INTRAVENOUS | Status: AC

## 2024-03-21 MED ORDER — INSULIN ASPART 100 UNIT/ML IJ SOLN
0.0000 [IU] | Freq: Three times a day (TID) | INTRAMUSCULAR | Status: DC
  Administered 2024-03-21: 1 [IU] via SUBCUTANEOUS
  Administered 2024-03-22: 2 [IU] via SUBCUTANEOUS
  Administered 2024-03-22: 1 [IU] via SUBCUTANEOUS
  Administered 2024-03-23 – 2024-03-24 (×4): 2 [IU] via SUBCUTANEOUS
  Administered 2024-03-24: 5 [IU] via SUBCUTANEOUS
  Administered 2024-03-24: 2 [IU] via SUBCUTANEOUS

## 2024-03-21 NOTE — Plan of Care (Signed)
 Educated on room equipment, diagnosis, and pain management

## 2024-03-21 NOTE — Plan of Care (Signed)

## 2024-03-21 NOTE — Progress Notes (Signed)
 PROGRESS NOTE  Brett White FMW:[id number] DOB: May 19, 1961 DOA: 03/20/2024 PCP: [facility]  HPI/Recap of past 24 hours: Brett White is a 63 y.o. male with medical history significant of HTN, HLD, generalized anxiety disorder, CKD stage II, IBS, chronic pain syndrome, AAA, DM type II, chronic transaminitis, hyperlipidemia,  left BKA, obesity presented to ED, complaining of right-sided low back pain and flank pain which has been ongoing for last 1 week. Patient reported increased urinary frequency, vomited once. Patient reported approximately 4 days ago he was treated for UTI with empiric Keflex .  Unfortunately, urine culture grew E. coli ESBL resistant to Keflex .  Due to persistent symptoms, patient was transported to the ED from rehab.  In the ED, patient hemodynamically stable, UA unremarkable, UC not done.  Labs done showed some AKI with creatinine of 1.7, mild hyperkalemia with potassium of 5.3, no leukocytosis. CT angio chest/abdomen/pelvis showed no evidence of aortic dissection, aneurysm, noted nonobstructing calculus in the right kidney, no acute finding of the abdomen.  Patient admitted for further management.    Today, patient still complaining of some right lower abdominal pain, stated he does have kidney stone which is hurting.  Appeared comfortable laying in bed without any issues.  Denied any chest pain, nausea/vomiting, fever/chills.   Assessment/Plan: Principal Problem:   Acute cystitis Active Problems:   Acute kidney injury superimposed on chronic kidney disease (HCC)   Hyperkalemia   Insulin  dependent type 2 diabetes mellitus (HCC)   Essential hypertension   Diabetic polyneuropathy associated with type 2 diabetes mellitus (HCC)   S/P BKA (below knee amputation) unilateral, left (HCC)   GERD without esophagitis   Generalized anxiety disorder   Chronic pain syndrome   History of abdominal aortic aneurysm (AAA)   HLD (hyperlipidemia)   Narcotic  dependence (HCC)   Peripheral neuropathy   Reactive airway disease   Insomnia   ESBL E. coli UTI Right-sided nonobstructing nephrolithiasis Right-sided renal cyst Currently afebrile, with no leukocytosis Urine culture from 6/15 growing ESBL E. Colil, was on empiric Keflex , looks like he was started on [place] M on 6/18 till he was admitted while at nursing home Repeat UA unremarkable No blood cultures drawn prior to starting Zosyn , if temp spike will order BC x 2 CT chest abdomen pelvis showed nonobstructing right kidney calculus and renal cyst.  No evidence of pyonephritis Continue IV Zosyn  based on urine culture sensitivity Continue IV fluids Was started on Flomax , continue   Acute kidney injury superimposed CKD stage II Baseline creatinine around 1.1-1.3, on admission 1.77 Continue IV fluids Daily BMP   Mild hyperkalemia K+ 5.3 S/p Lokelma  5 g one-time dose Daily BMP   Insulin -dependent DM type II Last A1c 8.1 SSI, Accu-Cheks, hypoglycemic protocol Hold home Farxiga , glipizide , Trulicity   Hypertension BP stable Hold home losartan , amlodipine , Lasix ,   Chronic diabetic polyneuropathy Chronic pain syndrome Chronic opioid dependence and tolerance At home patient takes Percocet every 4 hours. Has poor pain tolerance and opioid dependence. In the ED required 4 doses of Dilaudid  to control ??pain Continue Tylenol , Percocet as needed and Dilaudid  as needed Continue gabapentin  3 mg 4 times daily Continue Flexeril  as needed   History of left-sided BKA   Generalized anxiety disorder Continue Zoloft    Hyperlipidemia Continue Lipitor   Insomnia Continue trazodone    Reactive airway disease Continue DuoNeb as needed   IBS Continue Linzess    Chronic constipation Continue MiraLAX  and Senokot   GERD Continue famotidine   Obesity type I Lifestyle modification advised  Estimated body mass index is 34.15 kg/m as calculated from the following:   Height as of  this encounter: 6' 2 (1.88 m).   Weight as of this encounter: 120.7 kg.     Code Status: Full  Family Communication: None at bedside  Disposition Plan: Status is: Observation The patient remains OBS appropriate and will d/c before 2 midnights.      Consultants: None  Procedures: None  Antimicrobials: Zosyn   DVT prophylaxis: Lovenox    Objective: Vitals:   03/20/24 2246 03/20/24 2329 03/21/24 0421 03/21/24 0842  BP:  (!) 148/85 137/84 136/80  Pulse:  88 86 79  Resp:  18 18   Temp: 98.3 F (36.8 C) 98.1 F (36.7 C) 97.8 F (36.6 C) 97.7 F (36.5 C)  TempSrc: Oral Oral Oral Oral  SpO2:  95% 94% 95%  Weight:      Height:        Intake/Output Summary (Last 24 hours) at 03/21/2024 1350 Last data filed at 03/21/2024 9075 Gross per 24 hour  Intake 121 ml  Output 2375 ml  Net -2254 ml   Filed Weights   03/20/24 1349  Weight: 120.7 kg    Exam: General: NAD  Cardiovascular: S1, S2 present Respiratory: CTAB Abdomen: Soft, nontender, nondistended, bowel sounds present Musculoskeletal: Left BKA, no pedal edema noted on R Skin: Normal Psychiatry: Normal mood     Data Reviewed: CBC: Recent Labs  Lab 03/15/24 2137 03/20/24 1437 03/21/24 0603  WBC 7.2 8.3 8.7  NEUTROABS 6.1 6.1  --   HGB 10.9* 12.9* 11.5*  HCT 36.2* 42.7 38.2*  MCV 80.1 79.2* 79.9*  PLT 341 513* 387   Basic Metabolic Panel: Recent Labs  Lab 03/15/24 2137 03/20/24 1437 03/21/24 0603  NA 136 134* 136  K 5.0 5.3* 5.0  CL 101 98 104  CO2 22 26 27   GLUCOSE 375* 171* 136*  BUN 25* 24* 20  CREATININE 1.48* 1.77* 1.53*  CALCIUM  9.1 9.5 8.5*   GFR: Estimated Creatinine Clearance: 70.9 mL/min (A) (by C-G formula based on SCr of 1.53 mg/dL (H)). Liver Function Tests: Recent Labs  Lab 03/15/24 2137 03/20/24 1437 03/21/24 0603  AST 76* 41 36  ALT 65* 56* 43  ALKPHOS 101 101 89  BILITOT 0.4 0.4 0.5  PROT 7.6 7.6 6.3*  ALBUMIN  3.3* 3.6 3.0*   No results for input(s):  LIPASE, AMYLASE in the last 168 hours. No results for input(s): AMMONIA in the last 168 hours. Coagulation Profile: No results for input(s): INR, PROTIME in the last 168 hours. Cardiac Enzymes: No results for input(s): CKTOTAL, CKMB, CKMBINDEX, TROPONINI in the last 168 hours. BNP (last 3 results) No results for input(s): PROBNP in the last 8760 hours. HbA1C: No results for input(s): HGBA1C in the last 72 hours. CBG: Recent Labs  Lab 03/20/24 2153 03/21/24 0805 03/21/24 1216  GLUCAP 101* 151* 152*   Lipid Profile: No results for input(s): CHOL, HDL, LDLCALC, TRIG, CHOLHDL, LDLDIRECT in the last 72 hours. Thyroid Function Tests: No results for input(s): TSH, T4TOTAL, FREET4, T3FREE, THYROIDAB in the last 72 hours. Anemia Panel: No results for input(s): VITAMINB12, FOLATE, FERRITIN, TIBC, IRON, RETICCTPCT in the last 72 hours. Urine analysis:    Component Value Date/Time   COLORURINE YELLOW 03/20/2024 1716   APPEARANCEUR CLEAR 03/20/2024 1716   LABSPEC 1.016 03/20/2024 1716   PHURINE 5.0 03/20/2024 1716   GLUCOSEU >=500 (A) 03/20/2024 1716   HGBUR NEGATIVE 03/20/2024 1716   BILIRUBINUR NEGATIVE 03/20/2024 1716   KETONESUR  NEGATIVE 03/20/2024 1716   PROTEINUR NEGATIVE 03/20/2024 1716   NITRITE NEGATIVE 03/20/2024 1716   LEUKOCYTESUR NEGATIVE 03/20/2024 1716   Sepsis Labs: @LABRCNTIP (procalcitonin:4,lacticidven:4)  ) Recent Results (from the past 240 hours)  Urine Culture     Status: Abnormal   Collection Time: 03/15/24  9:31 PM   Specimen: Urine, Random  Result Value Ref Range Status   Specimen Description   Final    URINE, RANDOM Performed at [facility], [address], [place], [place] [address]    Special Requests   Final    NONE Reflexed from [phone] Performed at [facility], [address], [place], [place] [address]    Culture (A)  Final    >=100,000 COLONIES/mL  ESCHERICHIA COLI Confirmed Extended Spectrum Beta-Lactamase Producer (ESBL).  In bloodstream infections from ESBL organisms, carbapenems are preferred over piperacillin /tazobactam. They are shown to have a lower risk of mortality.    Report Status 03/18/2024 FINAL  Final   Organism ID, Bacteria ESCHERICHIA COLI (A)  Final      Susceptibility   Escherichia coli - MIC*    AMPICILLIN  >=32 RESISTANT Resistant     CEFAZOLIN  >=64 RESISTANT Resistant     CEFEPIME  >=32 RESISTANT Resistant     CEFTRIAXONE  >=64 RESISTANT Resistant     CIPROFLOXACIN >=4 RESISTANT Resistant     GENTAMICIN  <=1 SENSITIVE Sensitive     IMIPENEM <=0.25 SENSITIVE Sensitive     NITROFURANTOIN <=16 SENSITIVE Sensitive     TRIMETH /SULFA  >=320 RESISTANT Resistant     AMPICILLIN /SULBACTAM >=32 RESISTANT Resistant     PIP/TAZO <=4 SENSITIVE Sensitive ug/mL    * >=100,000 COLONIES/mL ESCHERICHIA COLI      Studies: CT Angio Chest/Abd/Pel for Dissection W and/or Wo Contrast Result Date: 03/20/2024 CLINICAL DATA:  Acute aortic syndrome suspected 63 year old male. Chest pain EXAM: CT ANGIOGRAPHY CHEST, ABDOMEN AND PELVIS TECHNIQUE: Non-contrast CT of the chest was initially obtained. Multidetector CT imaging through the chest, abdomen and pelvis was performed using the standard protocol during bolus administration of intravenous contrast. Multiplanar reconstructed images and MIPs were obtained and reviewed to evaluate the vascular anatomy. RADIATION DOSE REDUCTION: This exam was performed according to the departmental dose-optimization program which includes automated exposure control, adjustment of the mA and/or kV according to patient size and/or use of iterative reconstruction technique. CONTRAST:  OMNIPAQUE  IOHEXOL  350 MG/ML SOLN COMPARISON:  None Available. FINDINGS: CTA CHEST FINDINGS Cardiovascular: Non IV contrast images demonstrate no intramural hematoma within the thoracic aorta. Contrast series demonstrates no aortic  dissection or aneurysm. Great vessels are normal. No pericardial fluid. Mediastinum/Nodes: No axillary or supraclavicular adenopathy. No mediastinal or hilar adenopathy. No pericardial fluid. Esophagus normal. Lungs/Pleura: No pulmonary infarction. No pneumonia. No pleural fluid. No pneumothorax Musculoskeletal: No acute findings Review of the MIP images confirms the above findings. CTA ABDOMEN AND PELVIS FINDINGS VASCULAR Aorta: Normal caliber aorta without aneurysm, dissection, vasculitis or significant stenosis. Celiac: Patent without evidence of aneurysm, dissection, vasculitis or significant stenosis. SMA: Patent without evidence of aneurysm, dissection, vasculitis or significant stenosis. Renals: Both renal arteries are patent without evidence of aneurysm, dissection, vasculitis, fibromuscular dysplasia or significant stenosis. IMA: Patent without evidence of aneurysm, dissection, vasculitis or significant stenosis. Inflow: Patent without evidence of aneurysm, dissection, vasculitis or significant stenosis. Veins: No obvious venous abnormality within the limitations of this arterial phase study. Review of the MIP images confirms the above findings. NON-VASCULAR Hepatobiliary: No focal hepatic lesion. Normal gallbladder. No biliary duct dilatation. Common bile duct  is normal. Pancreas: Pancreas is normal. No ductal dilatation. No pancreatic inflammation. Spleen: Normal spleen Adrenals/urinary tract: Adrenal glands and kidneys are normal. Small exophytic cystic lesion extending from the lower pole of the RIGHT kidney measures 11 mm. On comparison CT 03/15/2024 this cyst was high-density on noncontrast imaging. No apparent enhancement on the postcontrast study. 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 Nonobstructing calculus in the RIGHT kidney. Ureters and bladder normal. Stomach/Bowel: The stomach, duodenum, and small bowel normal. The colon and rectosigmoid colon are normal.  Vascular/Lymphatic: Abdominal aorta is normal caliber. No periportal or retroperitoneal adenopathy. No pelvic adenopathy. Reproductive: Unremarkable Other: No free fluid. Musculoskeletal: No aggressive osseous lesion. Review of the MIP images confirms the above findings. IMPRESSION: CHEST: 1. No evidence of aortic dissection or aneurysm. 2. No acute pulmonary findings. PELVIS: 1. No evidence of aortic dissection or aneurysm. 2. No acute findings in the abdomen pelvis. 3. Nonobstructing calculus in the RIGHT kidney. 4. 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 JACR 2018 Feb; 264-273, Management of the Incidental Renal Mass on CT, [employer] 2021; 814-848, Bosniak Classification of Cystic Renal Masses, Version 2019. Electronically Signed   By: Jackquline Boxer M.D.   On: 03/20/2024 19:59   DG Chest 2 View Result Date: 03/20/2024 CLINICAL DATA:  Right flank pain and chest pain. EXAM: CHEST - 2 VIEW COMPARISON:  03/15/2024 FINDINGS: Lateral view degraded by patient arm position. Midline trachea. Mild cardiomegaly. Ascending aortic prominence is similar. No pleural effusion or pneumothorax. No congestive failure. Minimal subsegmental atelectasis or scarring at both lung bases. IMPRESSION: 1. No acute cardiopulmonary disease. 2. Similar ascending aortic prominence.  CTA is pending. Electronically Signed   By: Rockey Kilts M.D.   On: 03/20/2024 17:41    Scheduled Meds:  atorvastatin   40 mg Oral QHS   dapagliflozin  propanediol  10 mg Oral q morning   enoxaparin  (LOVENOX ) injection  40 mg Subcutaneous Q24H   famotidine   20 mg Oral Daily   glipiZIDE   10 mg Oral q morning   insulin  aspart  0-5 Units Subcutaneous QHS   insulin  aspart  0-6 Units Subcutaneous TID WC   linaclotide   145 mcg Oral Daily   polyethylene glycol  17 g Oral Daily   pregabalin   100 mg Oral QID   senna-docusate  1 tablet Oral QHS   sertraline   50 mg Oral Daily   sodium chloride  flush  3 mL  Intravenous Q12H   sodium chloride  flush  3 mL Intravenous Q12H   tamsulosin   0.4 mg Oral QPC supper   traZODone   75 mg Oral QHS    Continuous Infusions:  sodium chloride      lactated ringers  125 mL/hr at 03/21/24 1308   piperacillin -tazobactam (ZOSYN )  IV 3.375 g (03/21/24 1323)     LOS: 0 days     Lebron JINNY Cage, MD [facility]  If 7PM-7AM, please contact night-coverage [email or web address] 03/21/2024, 1:50 PM

## 2024-03-22 DIAGNOSIS — N390 Urinary tract infection, site not specified: Secondary | ICD-10-CM | POA: Diagnosis not present

## 2024-03-22 DIAGNOSIS — B9629 Other Escherichia coli [E. coli] as the cause of diseases classified elsewhere: Secondary | ICD-10-CM | POA: Diagnosis not present

## 2024-03-22 DIAGNOSIS — Z1612 Extended spectrum beta lactamase (ESBL) resistance: Secondary | ICD-10-CM | POA: Diagnosis not present

## 2024-03-22 LAB — BASIC METABOLIC PANEL WITH GFR
Anion gap: 3 — ABNORMAL LOW (ref 5–15)
BUN: 16 mg/dL (ref 6–20)
CO2: 28 mmol/L (ref 22–32)
Calcium: 8.2 mg/dL — ABNORMAL LOW (ref 8.9–10.3)
Chloride: 102 mmol/L (ref 98–111)
Creatinine, Ser: 1.72 mg/dL — ABNORMAL HIGH (ref 0.61–1.24)
GFR, Estimated: 45 mL/min — ABNORMAL LOW (ref >60)
Glucose, Bld: 153 mg/dL — ABNORMAL HIGH (ref 70–99)
Potassium: 4.5 mmol/L (ref 3.5–5.1)
Sodium: 133 mmol/L — ABNORMAL LOW (ref 135–145)

## 2024-03-22 LAB — GLUCOSE, CAPILLARY
Glucose-Capillary: 104 mg/dL — ABNORMAL HIGH (ref 70–99)
Glucose-Capillary: 155 mg/dL — ABNORMAL HIGH (ref 70–99)
Glucose-Capillary: 174 mg/dL — ABNORMAL HIGH (ref 70–99)
Glucose-Capillary: 145 mg/dL — ABNORMAL HIGH (ref 70–99)

## 2024-03-22 MED ORDER — POLYVINYL ALCOHOL 1.4 % OP SOLN
1.0000 [drp] | Freq: Four times a day (QID) | OPHTHALMIC | Status: DC
  Administered 2024-03-22 – 2024-03-24 (×10): 1 [drp] via OPHTHALMIC
  Filled 2024-03-22: qty 15

## 2024-03-22 MED ORDER — LACTATED RINGERS IV SOLN: INTRAVENOUS | Status: AC

## 2024-03-22 MED ORDER — BACITRACIN-POLYMYXIN B 500-10000 UNIT/GM OP OINT
TOPICAL_OINTMENT | Freq: Four times a day (QID) | OPHTHALMIC | Status: DC
  Filled 2024-03-22: qty 3.5

## 2024-03-22 MED ORDER — CARBOXYMETHYLCELLULOSE SODIUM 1 % OP SOLN: 4.0000 [drp] | Freq: Four times a day (QID) | OPHTHALMIC | Status: DC

## 2024-03-22 MED ORDER — ERYTHROMYCIN 5 MG/GM OP OINT
1.0000 | TOPICAL_OINTMENT | OPHTHALMIC | Status: DC
  Administered 2024-03-22 – 2024-03-24 (×18): 1 via OPHTHALMIC
  Filled 2024-03-22 (×2): qty 1

## 2024-03-22 NOTE — Evaluation (Addendum)
 Physical Therapy Evaluation Patient Details Name: Brett White MRN: [id number] DOB: 1961-07-20 Today's Date: 03/22/2024  History of Present Illness  The pt is a 63yo male presenting from [facility] 6/15 with middle back and R flank pain, CT showed kidney stones, urine showing UTI, pt d/c back to [place] with abx. Pt presenting again 6/20 for same, admitted for management. PMH includes: chronic pain, L BKA, DM II, HTN, CKD II, and stroke.   Clinical Impression  Pt in bed upon arrival of PT, agreeable to evaluation at this time after premedication with IV pain medication. Prior to admission the pt was mobilizing with use of L prosthetic, RW, or WC (pt reports WC for longer distances, RW for short distances). He was independent with ADLs and active with community programs at [facility] where he is a long-term resident. Pt generally mobilizing at supervision to CGA level this session with use of RW (increased caution as pt does not have LLE prosthetic with him). He was able to complete ~75 ft ambulation with hop-to gait pattern and good stability, but does need assist for intermittent sway and line management. Pt close to functional baseline with all needed assist and DME available at [facility]. Will follow acutely but no follow up PT needs.         If plan is discharge home, recommend the following: A little help with walking and/or transfers;A little help with bathing/dressing/bathroom;Help with stairs or ramp for entrance;Assist for transportation   Can travel by private vehicle   No    Equipment Recommendations None recommended by PT  Recommendations for Other Services       Functional Status Assessment Patient has had a recent decline in their functional status and demonstrates the ability to make significant improvements in function in a reasonable and predictable amount of time.     Precautions / Restrictions Precautions Precautions: Fall Recall of Precautions/Restrictions:  Intact Restrictions Weight Bearing Restrictions Per Provider Order: No      Mobility  Bed Mobility Overal bed mobility: Independent                  Transfers Overall transfer level: Needs assistance Equipment used: Rolling walker (2 wheels) Transfers: Sit to/from Stand Sit to Stand: Supervision           General transfer comment: steady with BUE support    Ambulation/Gait Ambulation/Gait assistance: Contact guard assist Gait Distance (Feet): 75 Feet Assistive device: Rolling walker (2 wheels)   Gait velocity: decreased Gait velocity interpretation: <1.31 ft/sec, indicative of household ambulator   General Gait Details: hop-to with RLE as pt does not have L prosthetic, stable but fatigued quickly     Balance Overall balance assessment: Needs assistance Sitting-balance support: No upper extremity supported Sitting balance-Leahy Scale: Good     Standing balance support: Bilateral upper extremity supported, During functional activity Standing balance-Leahy Scale: Fair Standing balance comment: dependent on BUE support with stance due to no L prosthetic                             Pertinent Vitals/Pain Pain Assessment Pain Assessment: 0-10 Pain Score: 7  Pain Location: back Pain Descriptors / Indicators: Discomfort, Grimacing Pain Intervention(s): Limited activity within patient's tolerance, Monitored during session, Repositioned, Premedicated before session    Home Living Family/patient expects to be discharged to:: Skilled nursing facility  Additional Comments: LTC at [facility]    Prior Function Prior Level of Function : Independent/Modified Independent             Mobility Comments: Wears L proshtesis, transfer to w/c or uses prosthetic + RW about 128ft ADLs Comments: wheels himself into shower, ind for ADLs/iALDS     Extremity/Trunk Assessment   Upper Extremity Assessment Upper Extremity Assessment:  Overall WFL for tasks assessed    Lower Extremity Assessment Lower Extremity Assessment: Overall WFL for tasks assessed    Cervical / Trunk Assessment Cervical / Trunk Assessment: Normal  Communication   Communication Communication: No apparent difficulties    Cognition Arousal: Alert Behavior During Therapy: WFL for tasks assessed/performed   PT - Cognitive impairments: No apparent impairments                         Following commands: Intact       Cueing Cueing Techniques: Verbal cues     General Comments General comments (skin integrity, edema, etc.): VSS on RA        Assessment/Plan    PT Assessment Patient needs continued PT services  PT Problem List Decreased strength;Decreased activity tolerance;Decreased balance;Decreased mobility       PT Treatment Interventions Gait training;DME instruction;Stair training;Functional mobility training;Therapeutic activities;Therapeutic exercise;Balance training;Patient/family education    PT Goals (Current goals can be found in the Care Plan section)  Acute Rehab PT Goals Patient Stated Goal: return to independence PT Goal Formulation: With patient Time For Goal Achievement: 04/05/24 Potential to Achieve Goals: Good    Frequency Min 1X/week        AM-PAC PT 6 Clicks Mobility  Outcome Measure Help needed turning from your back to your side while in a flat bed without using bedrails?: None Help needed moving from lying on your back to sitting on the side of a flat bed without using bedrails?: None Help needed moving to and from a bed to a chair (including a wheelchair)?: A Little Help needed standing up from a chair using your arms (e.g., wheelchair or bedside chair)?: A Little Help needed to walk in hospital room?: A Little Help needed climbing 3-5 steps with a railing? : A Lot 6 Click Score: 19    End of Session Equipment Utilized During Treatment: Gait belt Activity Tolerance: Patient tolerated  treatment well Patient left: in bed;with call bell/phone within reach;with bed alarm set Nurse Communication: Mobility status PT Visit Diagnosis: Unsteadiness on feet (R26.81);Pain Pain - part of body:  (back)    Time: 8377-8343 PT Time Calculation (min) (ACUTE ONLY): 34 min   Charges:   PT Evaluation $PT Eval Low Complexity: 1 Low PT Treatments $Therapeutic Exercise: 8-22 mins PT General Charges $$ ACUTE PT VISIT: 1 Visit         Izetta Call, PT, DPT   Acute Rehabilitation Department Office [phone] Secure Chat Communication Preferred  Izetta JULIANNA Call 03/22/2024, 5:06 PM

## 2024-03-22 NOTE — Plan of Care (Signed)

## 2024-03-22 NOTE — Progress Notes (Signed)
 PROGRESS NOTE  Brett White FMW:[id number] DOB: 1961-01-24 DOA: 03/20/2024 PCP: [facility]  HPI/Recap of past 24 hours: Brett White is a 63 y.o. male with medical history significant of HTN, HLD, generalized anxiety disorder, CKD stage II, IBS, chronic pain syndrome, AAA, DM type II, chronic transaminitis, hyperlipidemia,  left BKA, obesity presented to ED, complaining of right-sided low back pain and flank pain which has been ongoing for last 1 week. Patient reported increased urinary frequency, vomited once. Patient reported approximately 4 days ago he was treated for UTI with empiric Keflex .  Unfortunately, urine culture grew E. coli ESBL resistant to Keflex .  Due to persistent symptoms, patient was transported to the ED from rehab.  In the ED, patient hemodynamically stable, UA unremarkable, UC not done.  Labs done showed some AKI with creatinine of 1.7, mild hyperkalemia with potassium of 5.3, no leukocytosis. CT angio chest/abdomen/pelvis showed no evidence of aortic dissection, aneurysm, noted nonobstructing calculus in the right kidney, no acute finding of the abdomen.  Patient admitted for further management.    Today patient continues to complain of R kidney pain, appeared comfortable in bed.  Denies any nausea/vomiting, fever/chills.   Assessment/Plan: Principal Problem:   Acute cystitis Active Problems:   Acute kidney injury superimposed on chronic kidney disease (HCC)   Hyperkalemia   Insulin  dependent type 2 diabetes mellitus (HCC)   Essential hypertension   Diabetic polyneuropathy associated with type 2 diabetes mellitus (HCC)   S/P BKA (below knee amputation) unilateral, left (HCC)   GERD without esophagitis   Generalized anxiety disorder   Chronic pain syndrome   History of abdominal aortic aneurysm (AAA)   HLD (hyperlipidemia)   Narcotic dependence (HCC)   Peripheral neuropathy   Reactive airway disease   Insomnia   ESBL E. coli  UTI Right-sided nonobstructing nephrolithiasis Right-sided renal cyst Currently afebrile, with no leukocytosis Urine culture from 6/15 growing ESBL E. Colil, was on empiric Keflex , looks like he was started on [place] M on 6/18 till he was admitted while at nursing home Repeat UA unremarkable No blood cultures drawn prior to starting Zosyn , if temp spike will order BC x 2 CT chest abdomen pelvis showed nonobstructing right kidney calculus and renal cyst.  No evidence of pyonephritis Continue IV Zosyn  based on urine culture sensitivity Continue IV fluids Was started on Flomax , continue   Acute kidney injury superimposed CKD stage II Baseline creatinine around 1.1-1.3, on admission 1.77 Continue IV fluids Daily BMP   Mild hyperkalemia K+ 5.3 S/p Lokelma  5 g one-time dose Daily BMP   Insulin -dependent DM type II Last A1c 8.1 SSI, Accu-Cheks, hypoglycemic protocol Hold home Farxiga , glipizide , Trulicity   Hypertension BP stable Hold home losartan , amlodipine , Lasix ,   Chronic diabetic polyneuropathy Chronic pain syndrome Chronic opioid dependence and tolerance At home patient takes Percocet every 4 hours. Has poor pain tolerance and opioid dependence. In the ED required 4 doses of Dilaudid  to control ??pain Continue Tylenol , Percocet as needed and Dilaudid  as needed Continue gabapentin  3 mg 4 times daily Continue Flexeril  as needed   History of left-sided BKA   Generalized anxiety disorder Continue Zoloft    Hyperlipidemia Continue Lipitor   Insomnia Continue trazodone    Reactive airway disease Continue DuoNeb as needed   IBS Continue Linzess    Chronic constipation Continue MiraLAX  and Senokot   GERD Continue famotidine   Obesity type I Lifestyle modification advised     Estimated body mass index is 34.15 kg/m as calculated from the following:  Height as of this encounter: 6' 2 (1.88 m).   Weight as of this encounter: 120.7 kg.     Code Status:  Full  Family Communication: None at bedside  Disposition Plan: Status is: Inpatient The patient remains OBS appropriate and will d/c before 2 midnights.      Consultants: None  Procedures: None  Antimicrobials: Zosyn   DVT prophylaxis: Lovenox    Objective: Vitals:   03/21/24 2027 03/22/24 0350 03/22/24 0809 03/22/24 1621  BP: 128/66 109/81 (!) 142/90 110/65  Pulse: 77 85 82 81  Resp: 19 18    Temp: 97.8 F (36.6 C) 98.4 F (36.9 C) 97.7 F (36.5 C) 98 F (36.7 C)  TempSrc:   Oral Oral  SpO2: 94% 94% 95% 96%  Weight:      Height:        Intake/Output Summary (Last 24 hours) at 03/22/2024 1707 Last data filed at 03/22/2024 1500 Gross per 24 hour  Intake 2296.83 ml  Output 2400 ml  Net -103.17 ml   Filed Weights   03/20/24 1349  Weight: 120.7 kg    Exam: General: NAD  Cardiovascular: S1, S2 present Respiratory: CTAB Abdomen: Soft, nontender, nondistended, bowel sounds present Musculoskeletal: Left BKA, no pedal edema noted on R Skin: Normal Psychiatry: Normal mood     Data Reviewed: CBC: Recent Labs  Lab 03/15/24 2137 03/20/24 1437 03/21/24 0603  WBC 7.2 8.3 8.7  NEUTROABS 6.1 6.1  --   HGB 10.9* 12.9* 11.5*  HCT 36.2* 42.7 38.2*  MCV 80.1 79.2* 79.9*  PLT 341 513* 387   Basic Metabolic Panel: Recent Labs  Lab 03/15/24 2137 03/20/24 1437 03/21/24 0603 03/22/24 0345  NA 136 134* 136 133*  K 5.0 5.3* 5.0 4.5  CL 101 98 104 102  CO2 22 26 27 28   GLUCOSE 375* 171* 136* 153*  BUN 25* 24* 20 16  CREATININE 1.48* 1.77* 1.53* 1.72*  CALCIUM  9.1 9.5 8.5* 8.2*   GFR: Estimated Creatinine Clearance: 63 mL/min (A) (by C-G formula based on SCr of 1.72 mg/dL (H)). Liver Function Tests: Recent Labs  Lab 03/15/24 2137 03/20/24 1437 03/21/24 0603  AST 76* 41 36  ALT 65* 56* 43  ALKPHOS 101 101 89  BILITOT 0.4 0.4 0.5  PROT 7.6 7.6 6.3*  ALBUMIN  3.3* 3.6 3.0*   No results for input(s): LIPASE, AMYLASE in the last 168 hours. No  results for input(s): AMMONIA in the last 168 hours. Coagulation Profile: No results for input(s): INR, PROTIME in the last 168 hours. Cardiac Enzymes: No results for input(s): CKTOTAL, CKMB, CKMBINDEX, TROPONINI in the last 168 hours. BNP (last 3 results) No results for input(s): PROBNP in the last 8760 hours. HbA1C: No results for input(s): HGBA1C in the last 72 hours. CBG: Recent Labs  Lab 03/21/24 1639 03/21/24 2214 03/22/24 0803 03/22/24 1152 03/22/24 1618  GLUCAP 165* 128* 104* 155* 174*   Lipid Profile: No results for input(s): CHOL, HDL, LDLCALC, TRIG, CHOLHDL, LDLDIRECT in the last 72 hours. Thyroid Function Tests: No results for input(s): TSH, T4TOTAL, FREET4, T3FREE, THYROIDAB in the last 72 hours. Anemia Panel: No results for input(s): VITAMINB12, FOLATE, FERRITIN, TIBC, IRON, RETICCTPCT in the last 72 hours. Urine analysis:    Component Value Date/Time   COLORURINE YELLOW 03/20/2024 1716   APPEARANCEUR CLEAR 03/20/2024 1716   LABSPEC 1.016 03/20/2024 1716   PHURINE 5.0 03/20/2024 1716   GLUCOSEU >=500 (A) 03/20/2024 1716   HGBUR NEGATIVE 03/20/2024 1716   BILIRUBINUR NEGATIVE 03/20/2024 1716  KETONESUR NEGATIVE 03/20/2024 1716   PROTEINUR NEGATIVE 03/20/2024 1716   NITRITE NEGATIVE 03/20/2024 1716   LEUKOCYTESUR NEGATIVE 03/20/2024 1716   Sepsis Labs: @LABRCNTIP (procalcitonin:4,lacticidven:4)  ) Recent Results (from the past 240 hours)  Urine Culture     Status: Abnormal   Collection Time: 03/15/24  9:31 PM   Specimen: Urine, Random  Result Value Ref Range Status   Specimen Description   Final    URINE, RANDOM Performed at [facility], [address] [place], [place] [address]    Special Requests   Final    NONE Reflexed from [id number] Performed at [facility], [address] [place], [place] [address]    Culture (A)  Final    >=100,000 COLONIES/mL  ESCHERICHIA COLI Confirmed Extended Spectrum Beta-Lactamase Producer (ESBL).  In bloodstream infections from ESBL organisms, carbapenems are preferred over piperacillin /tazobactam. They are shown to have a lower risk of mortality.    Report Status 03/18/2024 FINAL  Final   Organism ID, Bacteria ESCHERICHIA COLI (A)  Final      Susceptibility   Escherichia coli - MIC*    AMPICILLIN  >=32 RESISTANT Resistant     CEFAZOLIN  >=64 RESISTANT Resistant     CEFEPIME  >=32 RESISTANT Resistant     CEFTRIAXONE  >=64 RESISTANT Resistant     CIPROFLOXACIN >=4 RESISTANT Resistant     GENTAMICIN  <=1 SENSITIVE Sensitive     IMIPENEM <=0.25 SENSITIVE Sensitive     NITROFURANTOIN <=16 SENSITIVE Sensitive     TRIMETH /SULFA  >=320 RESISTANT Resistant     AMPICILLIN /SULBACTAM >=32 RESISTANT Resistant     PIP/TAZO <=4 SENSITIVE Sensitive ug/mL    * >=100,000 COLONIES/mL ESCHERICHIA COLI      Studies: No results found.   Scheduled Meds:  artificial tears  1 drop Left Eye QID   atorvastatin   40 mg Oral QHS   bacitracin -polymyxin b    Right Eye QID   enoxaparin  (LOVENOX ) injection  40 mg Subcutaneous Q24H   erythromycin  1 Application Left Eye Q2H   famotidine   20 mg Oral Daily   insulin  aspart  0-5 Units Subcutaneous QHS   insulin  aspart  0-9 Units Subcutaneous TID WC   linaclotide   145 mcg Oral Daily   polyethylene glycol  17 g Oral Daily   pregabalin   100 mg Oral QID   senna-docusate  1 tablet Oral QHS   sertraline   50 mg Oral Daily   sodium chloride  flush  3 mL Intravenous Q12H   sodium chloride  flush  3 mL Intravenous Q12H   tamsulosin   0.4 mg Oral QPC supper   traZODone   75 mg Oral QHS    Continuous Infusions:  piperacillin -tazobactam (ZOSYN )  IV 3.375 g (03/22/24 1348)     LOS: 1 day     Lebron JINNY Cage, MD [facility]  If 7PM-7AM, please contact night-coverage [email or web address] 03/22/2024, 5:07 PM

## 2024-03-23 DIAGNOSIS — N3000 Acute cystitis without hematuria: Secondary | ICD-10-CM

## 2024-03-23 LAB — BASIC METABOLIC PANEL WITH GFR
Anion gap: 9 (ref 5–15)
BUN: 15 mg/dL (ref 6–20)
CO2: 26 mmol/L (ref 22–32)
Calcium: 8.8 mg/dL — ABNORMAL LOW (ref 8.9–10.3)
Chloride: 100 mmol/L (ref 98–111)
Creatinine, Ser: 1.35 mg/dL — ABNORMAL HIGH (ref 0.61–1.24)
GFR, Estimated: >60 mL/min (ref >60)
Glucose, Bld: 169 mg/dL — ABNORMAL HIGH (ref 70–99)
Potassium: 4.4 mmol/L (ref 3.5–5.1)
Sodium: 135 mmol/L (ref 135–145)

## 2024-03-23 LAB — MRSA NEXT GEN BY PCR, NASAL: MRSA by PCR Next Gen: NOT DETECTED

## 2024-03-23 LAB — GLUCOSE, CAPILLARY
Glucose-Capillary: 154 mg/dL — ABNORMAL HIGH (ref 70–99)
Glucose-Capillary: 156 mg/dL — ABNORMAL HIGH (ref 70–99)
Glucose-Capillary: 183 mg/dL — ABNORMAL HIGH (ref 70–99)
Glucose-Capillary: 194 mg/dL — ABNORMAL HIGH (ref 70–99)

## 2024-03-23 MED ORDER — AMLODIPINE BESYLATE 10 MG PO TABS
10.0000 mg | ORAL_TABLET | Freq: Every morning | ORAL | Status: DC
  Administered 2024-03-24: 10 mg via ORAL
  Filled 2024-03-23: qty 1

## 2024-03-23 NOTE — Progress Notes (Signed)
 PROGRESS NOTE  Brett White FMW:[id number] DOB: 08/13/1961 DOA: 03/20/2024 PCP: [facility]  HPI/Recap of past 24 hours: Brett White is a 63 y.o. male with medical history significant of HTN, HLD, generalized anxiety disorder, CKD stage II, IBS, chronic pain syndrome, AAA, DM type II, chronic transaminitis, hyperlipidemia,  left BKA, obesity presented to ED, complaining of right-sided low back pain and flank pain which has been ongoing for last 1 week. Patient reported increased urinary frequency, vomited once. Patient reported approximately 4 days ago he was treated for UTI with empiric Keflex .  Unfortunately, urine culture grew E. coli ESBL resistant to Keflex .  Due to persistent symptoms, patient was transported to the ED from rehab.  In the ED, patient hemodynamically stable, UA unremarkable, UC not done.  Labs done showed some AKI with creatinine of 1.7, mild hyperkalemia with potassium of 5.3, no leukocytosis. CT angio chest/abdomen/pelvis showed no evidence of aortic dissection, aneurysm, noted nonobstructing calculus in the right kidney, no acute finding of the abdomen.  Patient admitted for further management.    Today, continues to complain about  right kidney pain, denies any nausea/vomiting, fever/chills.  Lays comfortably in bed.  On IV Dilaudid  every 4 hours.  Unable to ambulate as patient left his prosthesis at [facility] long-term care   Assessment/Plan: Principal Problem:   Acute cystitis Active Problems:   Acute kidney injury superimposed on chronic kidney disease (HCC)   Hyperkalemia   Insulin  dependent type 2 diabetes mellitus (HCC)   Essential hypertension   Diabetic polyneuropathy associated with type 2 diabetes mellitus (HCC)   S/P BKA (below knee amputation) unilateral, left (HCC)   GERD without esophagitis   Generalized anxiety disorder   Chronic pain syndrome   History of abdominal aortic aneurysm (AAA)   HLD (hyperlipidemia)   Narcotic  dependence (HCC)   Peripheral neuropathy   Reactive airway disease   Insomnia   ESBL E. coli UTI Right-sided nonobstructing nephrolithiasis Right-sided renal cyst Currently afebrile, with no leukocytosis Urine culture from 6/15 growing ESBL E. Colil, was on empiric Keflex , received 3 doses of ertapenem prior to admission while at nursing home Repeat UA unremarkable No blood cultures drawn prior to starting Zosyn , if temp spike will order BC x 2 CT chest abdomen pelvis showed nonobstructing right kidney calculus and renal cyst.  No evidence of pyelonephritis Discussed extensively with urology NP Ole Bourdon on 6/23, noted kidney stones, no obstruction, unlikely to be the reason for right-sided abdominal pain Continue IV Zosyn  to complete 7 days of antibiotic, last day 6/24 Continue IV fluids Continue Flomax    Acute kidney injury superimposed CKD stage II Baseline creatinine around 1.1-1.3, on admission 1.77 Continue IV fluids Daily BMP   Mild hyperkalemia K+ 5.3 S/p Lokelma  5 g one-time dose Daily BMP   Insulin -dependent DM type II Last A1c 8.1 SSI, Accu-Cheks, hypoglycemic protocol Hold home Farxiga , glipizide , Trulicity   Hypertension BP stable Continue amlodipine , hold losartan , Lasix    Chronic diabetic polyneuropathy Chronic pain syndrome Chronic opioid dependence and tolerance At home patient takes Percocet every 4 hours. Has poor pain tolerance and opioid dependence Continue Tylenol , Percocet as needed and Dilaudid  as needed Continue gabapentin  3 mg 4 times daily Continue Flexeril  as needed   History of left-sided BKA   Generalized anxiety disorder Continue Zoloft    Hyperlipidemia Continue Lipitor   Insomnia Continue trazodone    Reactive airway disease Continue DuoNeb as needed   IBS Continue Linzess    Chronic constipation Continue MiraLAX  and Senokot  GERD Continue famotidine   Obesity type I Lifestyle modification advised      Estimated body mass index is 34.15 kg/m as calculated from the following:   Height as of this encounter: 6' 2 (1.88 m).   Weight as of this encounter: 120.7 kg.     Code Status: Full  Family Communication: None at bedside  Disposition Plan: Status is: Inpatient     Consultants: None  Procedures: None  Antimicrobials: Zosyn   DVT prophylaxis: Lovenox    Objective: Vitals:   03/22/24 1621 03/22/24 1927 03/23/24 0454 03/23/24 0741  BP: 110/65 117/71 (!) 157/73 (!) 152/68  Pulse: 81 87 77 80  Resp:  18 18 18   Temp: 98 F (36.7 C) 98.1 F (36.7 C) 98.2 F (36.8 C)   TempSrc: Oral     SpO2: 96% 94% 92% 92%  Weight:      Height:        Intake/Output Summary (Last 24 hours) at 03/23/2024 1504 Last data filed at 03/23/2024 9092 Gross per 24 hour  Intake 1093.99 ml  Output 2875 ml  Net -1781.01 ml   Filed Weights   03/20/24 1349  Weight: 120.7 kg    Exam: General: NAD  Cardiovascular: S1, S2 present Respiratory: CTAB Abdomen: Soft, nontender, nondistended, bowel sounds present Musculoskeletal: Left BKA, no pedal edema noted on R Skin: Normal Psychiatry: Normal mood     Data Reviewed: CBC: Recent Labs  Lab 03/20/24 1437 03/21/24 0603  WBC 8.3 8.7  NEUTROABS 6.1  --   HGB 12.9* 11.5*  HCT 42.7 38.2*  MCV 79.2* 79.9*  PLT 513* 387   Basic Metabolic Panel: Recent Labs  Lab 03/20/24 1437 03/21/24 0603 03/22/24 0345 03/23/24 0909  NA 134* 136 133* 135  K 5.3* 5.0 4.5 4.4  CL 98 104 102 100  CO2 26 27 28 26   GLUCOSE 171* 136* 153* 169*  BUN 24* 20 16 15   CREATININE 1.77* 1.53* 1.72* 1.35*  CALCIUM  9.5 8.5* 8.2* 8.8*   GFR: Estimated Creatinine Clearance: 80.3 mL/min (A) (by C-G formula based on SCr of 1.35 mg/dL (H)). Liver Function Tests: Recent Labs  Lab 03/20/24 1437 03/21/24 0603  AST 41 36  ALT 56* 43  ALKPHOS 101 89  BILITOT 0.4 0.5  PROT 7.6 6.3*  ALBUMIN  3.6 3.0*   No results for input(s): LIPASE, AMYLASE in the  last 168 hours. No results for input(s): AMMONIA in the last 168 hours. Coagulation Profile: No results for input(s): INR, PROTIME in the last 168 hours. Cardiac Enzymes: No results for input(s): CKTOTAL, CKMB, CKMBINDEX, TROPONINI in the last 168 hours. BNP (last 3 results) No results for input(s): PROBNP in the last 8760 hours. HbA1C: No results for input(s): HGBA1C in the last 72 hours. CBG: Recent Labs  Lab 03/22/24 1152 03/22/24 1618 03/22/24 2147 03/23/24 0739 03/23/24 1147  GLUCAP 155* 174* 145* 154* 156*   Lipid Profile: No results for input(s): CHOL, HDL, LDLCALC, TRIG, CHOLHDL, LDLDIRECT in the last 72 hours. Thyroid Function Tests: No results for input(s): TSH, T4TOTAL, FREET4, T3FREE, THYROIDAB in the last 72 hours. Anemia Panel: No results for input(s): VITAMINB12, FOLATE, FERRITIN, TIBC, IRON, RETICCTPCT in the last 72 hours. Urine analysis:    Component Value Date/Time   COLORURINE YELLOW 03/20/2024 1716   APPEARANCEUR CLEAR 03/20/2024 1716   LABSPEC 1.016 03/20/2024 1716   PHURINE 5.0 03/20/2024 1716   GLUCOSEU >=500 (A) 03/20/2024 1716   HGBUR NEGATIVE 03/20/2024 1716   BILIRUBINUR NEGATIVE 03/20/2024 1716   KETONESUR  NEGATIVE 03/20/2024 1716   PROTEINUR NEGATIVE 03/20/2024 1716   NITRITE NEGATIVE 03/20/2024 1716   LEUKOCYTESUR NEGATIVE 03/20/2024 1716   Sepsis Labs: @LABRCNTIP (procalcitonin:4,lacticidven:4)  ) Recent Results (from the past 240 hours)  Urine Culture     Status: Abnormal   Collection Time: 03/15/24  9:31 PM   Specimen: Urine, Random  Result Value Ref Range Status   Specimen Description   Final    URINE, RANDOM Performed at [facility], [address], [place], [place] [address]    Special Requests   Final    NONE Reflexed from [id number] Performed at [facility], [address] [place], [place] [address]    Culture (A)  Final    >=100,000  COLONIES/mL ESCHERICHIA COLI Confirmed Extended Spectrum Beta-Lactamase Producer (ESBL).  In bloodstream infections from ESBL organisms, carbapenems are preferred over piperacillin /tazobactam. They are shown to have a lower risk of mortality.    Report Status 03/18/2024 FINAL  Final   Organism ID, Bacteria ESCHERICHIA COLI (A)  Final      Susceptibility   Escherichia coli - MIC*    AMPICILLIN  >=32 RESISTANT Resistant     CEFAZOLIN  >=64 RESISTANT Resistant     CEFEPIME  >=32 RESISTANT Resistant     CEFTRIAXONE  >=64 RESISTANT Resistant     CIPROFLOXACIN >=4 RESISTANT Resistant     GENTAMICIN  <=1 SENSITIVE Sensitive     IMIPENEM <=0.25 SENSITIVE Sensitive     NITROFURANTOIN <=16 SENSITIVE Sensitive     TRIMETH /SULFA  >=320 RESISTANT Resistant     AMPICILLIN /SULBACTAM >=32 RESISTANT Resistant     PIP/TAZO <=4 SENSITIVE Sensitive ug/mL    * >=100,000 COLONIES/mL ESCHERICHIA COLI  MRSA Next Gen by PCR, Nasal     Status: None   Collection Time: 03/23/24  9:36 AM   Specimen: Nasal Mucosa; Nasal Swab  Result Value Ref Range Status   MRSA by PCR Next Gen NOT DETECTED NOT DETECTED Final    Comment: (NOTE) The GeneXpert MRSA Assay (FDA approved for NASAL specimens only), is one component of a comprehensive MRSA colonization surveillance program. It is not intended to diagnose MRSA infection nor to guide or monitor treatment for MRSA infections. Test performance is not FDA approved in patients less than 54 years old. Performed at [facility], [address] [place], [place] [address]       Studies: No results found.   Scheduled Meds:  artificial tears  1 drop Left Eye QID   atorvastatin   40 mg Oral QHS   bacitracin -polymyxin b    Right Eye QID   enoxaparin  (LOVENOX ) injection  40 mg Subcutaneous Q24H   erythromycin  1 Application Left Eye Q2H   famotidine   20 mg Oral Daily   insulin  aspart  0-5 Units Subcutaneous QHS   insulin  aspart  0-9 Units Subcutaneous TID WC    linaclotide   145 mcg Oral Daily   polyethylene glycol  17 g Oral Daily   pregabalin   100 mg Oral QID   senna-docusate  1 tablet Oral QHS   sertraline   50 mg Oral Daily   sodium chloride  flush  3 mL Intravenous Q12H   sodium chloride  flush  3 mL Intravenous Q12H   tamsulosin   0.4 mg Oral QPC supper   traZODone   75 mg Oral QHS    Continuous Infusions:  lactated ringers  75 mL/hr at 03/23/24 1202   piperacillin -tazobactam (ZOSYN )  IV 3.375 g (03/23/24 1335)     LOS: 2 days     Lebron  JINNY Cage, MD [facility]  If 7PM-7AM, please contact night-coverage [email or web address] 03/23/2024, 3:04 PM

## 2024-03-23 NOTE — Progress Notes (Signed)
 Mobility Specialist: Progress Note   03/23/24 1234  Mobility  Activity Refused mobility  Mobility Specialist Start Time (ACUTE ONLY) 1234    Pt declined all mobility. Stated he's in a lot of pain today and also doesn't want to walk without his prosthetic all though he's done so in a previous PT eval note. Left in bed, all needs met.  Ileana Lute Mobility Specialist Please contact via SecureChat or Rehab office at [phone]

## 2024-03-23 NOTE — Progress Notes (Signed)
 OT Cancellation Note  Patient Details Name: Brett White MRN: [id number] DOB: Oct 30, 1960   Cancelled Treatment:    Reason Eval/Treat Not Completed: Pain limiting ability to participate (Pt refused any movement at this time, c/o pain in R kidney and defers activities. OT to follow-up with pt as able.)  03/23/2024  AB, OTR/L  Acute Rehabilitation Services  Office: [phone]   Curtistine JONETTA Das 03/23/2024, 9:13 AM

## 2024-03-23 NOTE — Plan of Care (Signed)
  Problem: Coping: Goal: Ability to adjust to condition or change in health will improve Outcome: Progressing   Problem: Fluid Volume: Goal: Ability to maintain a balanced intake and output will improve Outcome: Progressing   Problem: Metabolic: Goal: Ability to maintain appropriate glucose levels will improve Outcome: Progressing   Problem: Nutritional: Goal: Maintenance of adequate nutrition will improve Outcome: Progressing   Problem: Skin Integrity: Goal: Risk for impaired skin integrity will decrease Outcome: Progressing   Problem: Tissue Perfusion: Goal: Adequacy of tissue perfusion will improve Outcome: Progressing   Problem: Education: Goal: Knowledge of General Education information will improve Description: Including pain rating scale, medication(s)/side effects and non-pharmacologic comfort measures Outcome: Progressing   Problem: Clinical Measurements: Goal: Diagnostic test results will improve Outcome: Progressing Goal: Respiratory complications will improve Outcome: Progressing   Problem: Activity: Goal: Risk for activity intolerance will decrease Outcome: Progressing   Problem: Nutrition: Goal: Adequate nutrition will be maintained Outcome: Progressing   Problem: Elimination: Goal: Will not experience complications related to urinary retention Outcome: Progressing   Problem: Pain Managment: Goal: General experience of comfort will improve and/or be controlled Outcome: Progressing   Problem: Safety: Goal: Ability to remain free from injury will improve Outcome: Progressing   Problem: Skin Integrity: Goal: Risk for impaired skin integrity will decrease Outcome: Progressing

## 2024-03-23 NOTE — Plan of Care (Signed)

## 2024-03-23 NOTE — TOC Initial Note (Signed)
 Transition of Care [facility]) - Initial/Assessment Note    Patient Details  Name: Brett White MRN: [id number] Date of Birth: [birth year]  Transition of Care [facility]) CM/SW Contact:    Sheffield Hawker A [place], LCSW Phone Number: 03/23/2024, 3:09 PM  Clinical Narrative:                  CSW met with pt at bedside. He said that he was from [facility] for long term care. Plan to return.   CSW reached out to [facility], confirmed pt is LTC at facility for about 2 years, can return. Requested to return with authorization if has skilled need.   TOC will continue to follow.  Expected Discharge Plan: Skilled Nursing Facility Barriers to Discharge: Continued Medical Work up, English as a second language teacher   Patient Goals and CMS Choice            Expected Discharge Plan and Services       Living arrangements for the past 2 months: Skilled Nursing Facility                                      Prior Living Arrangements/Services Living arrangements for the past 2 months: Skilled Nursing Facility Lives with:: Facility Resident          Need for Family Participation in Patient Care: No (Comment) Care giver support system in place?: Yes (comment) (pt's relative, Rosaline)      Activities of Daily Living   ADL Screening (condition at time of admission) Independently performs ADLs?: Yes (appropriate for developmental age) Is the patient deaf or have difficulty hearing?: No Does the patient have difficulty seeing, even when wearing glasses/contacts?: No Does the patient have difficulty concentrating, remembering, or making decisions?: No  Permission Sought/Granted                  Emotional Assessment Appearance:: Appears stated age Attitude/Demeanor/Rapport: Lethargic Affect (typically observed): Appropriate, Blunt Orientation: : Oriented to Self, Oriented to Place, Oriented to Situation Alcohol  / Substance Use: Not Applicable Psych Involvement: No (comment)  Admission  diagnosis:  Acute cystitis [N30.00] Patient Active Problem List   Diagnosis Date Noted   Acute cystitis 03/20/2024   Hyperkalemia 03/20/2024   Generalized anxiety disorder 03/20/2024   Chronic pain syndrome 03/20/2024   History of abdominal aortic aneurysm (AAA) 03/20/2024   HLD (hyperlipidemia) 03/20/2024   Narcotic dependence (HCC) 03/20/2024   Peripheral neuropathy 03/20/2024   Reactive airway disease 03/20/2024   Insomnia 03/20/2024   Unspecified open wound, right foot, initial encounter 05/14/2023   Pneumonia 11/16/2022   Acute respiratory failure with hypoxia (HCC) 11/16/2022   Sepsis with acute organ dysfunction (HCC) 11/16/2022   Aspiration pneumonia (HCC) 11/16/2022   GERD without esophagitis 06/11/2022   Mixed diabetic hyperlipidemia associated with type 2 diabetes mellitus (HCC) 06/11/2022   Adjustment disorder with depressed mood 05/23/2022   Chronic hyponatremia 12/09/2021   Uncontrolled type 2 diabetes mellitus with hyperglycemia, without long-term current use of insulin  (HCC) 12/09/2021   Tachycardia 12/09/2021   Acute kidney injury superimposed on chronic kidney disease (HCC) 10/04/2021   S/P BKA (below knee amputation) unilateral, left (HCC) 04/05/2021   Insulin  dependent type 2 diabetes mellitus (HCC) 02/03/2021   Essential hypertension 02/03/2021   Diabetic polyneuropathy associated with type 2 diabetes mellitus (HCC) 02/03/2021   PCP:  [facility]:  No Pharmacies Listed    Social Drivers of Health (  SDOH) Social History: SDOH Screenings   Food Insecurity: No Food Insecurity (03/20/2024)  Housing: Low Risk  (03/22/2024)  Transportation Needs: No Transportation Needs (03/21/2024)  Utilities: Not At Risk (03/21/2024)  Tobacco Use: Low Risk  (03/20/2024)   SDOH Interventions: Housing Interventions: Intervention Not Indicated Transportation Interventions: Intervention Not Indicated Utilities Interventions: Intervention Not  Indicated   Readmission Risk Interventions    11/16/2022    2:57 PM  Readmission Risk Prevention Plan  Transportation Screening Complete  Medication Review (RN Care Manager) Complete  PCP or Specialist appointment within 3-5 days of discharge Complete  HRI or Home Care Consult Complete  SW Recovery Care/Counseling Consult Complete  Palliative Care Screening Complete  Skilled Nursing Facility Complete

## 2024-03-24 ENCOUNTER — Inpatient Hospital Stay (HOSPITAL_COMMUNITY)

## 2024-03-24 LAB — CBC WITH DIFFERENTIAL/PLATELET
Abs Immature Granulocytes: 0.02 10*3/uL (ref 0.00–0.07)
Basophils Absolute: 0 10*3/uL (ref 0.0–0.1)
Basophils Relative: 1 %
Eosinophils Absolute: 0.3 10*3/uL (ref 0.0–0.5)
Eosinophils Relative: 6 %
HCT: 40.3 % (ref 39.0–52.0)
Hemoglobin: 12.1 g/dL — ABNORMAL LOW (ref 13.0–17.0)
Immature Granulocytes: 0 %
Lymphocytes Relative: 27 %
Lymphs Abs: 1.6 10*3/uL (ref 0.7–4.0)
MCH: 24 pg — ABNORMAL LOW (ref 26.0–34.0)
MCHC: 30 g/dL (ref 30.0–36.0)
MCV: 80 fL (ref 80.0–100.0)
Monocytes Absolute: 0.5 10*3/uL (ref 0.1–1.0)
Monocytes Relative: 9 %
Neutro Abs: 3.3 10*3/uL (ref 1.7–7.7)
Neutrophils Relative %: 57 %
Platelets: 384 10*3/uL (ref 150–400)
RBC: 5.04 MIL/uL (ref 4.22–5.81)
RDW: 17.2 % — ABNORMAL HIGH (ref 11.5–15.5)
WBC: 5.8 10*3/uL (ref 4.0–10.5)
nRBC: 0 % (ref 0.0–0.2)

## 2024-03-24 LAB — GLUCOSE, CAPILLARY
Glucose-Capillary: 171 mg/dL — ABNORMAL HIGH (ref 70–99)
Glucose-Capillary: 289 mg/dL — ABNORMAL HIGH (ref 70–99)
Glucose-Capillary: 178 mg/dL — ABNORMAL HIGH (ref 70–99)

## 2024-03-24 LAB — BASIC METABOLIC PANEL WITH GFR
Anion gap: 10 (ref 5–15)
BUN: 22 mg/dL — ABNORMAL HIGH (ref 6–20)
CO2: 25 mmol/L (ref 22–32)
Calcium: 9 mg/dL (ref 8.9–10.3)
Chloride: 103 mmol/L (ref 98–111)
Creatinine, Ser: 1.32 mg/dL — ABNORMAL HIGH (ref 0.61–1.24)
GFR, Estimated: >60 mL/min (ref >60)
Glucose, Bld: 174 mg/dL — ABNORMAL HIGH (ref 70–99)
Potassium: 4.1 mmol/L (ref 3.5–5.1)
Sodium: 138 mmol/L (ref 135–145)

## 2024-03-24 MED ORDER — TAMSULOSIN HCL 0.4 MG PO CAPS
0.4000 mg | ORAL_CAPSULE | Freq: Every day | ORAL | Status: DC
Start: 2024-03-24 — End: 2024-04-27

## 2024-03-24 MED ORDER — LOSARTAN POTASSIUM 100 MG PO TABS: 50.0000 mg | ORAL_TABLET | Freq: Every morning | ORAL | Status: DC

## 2024-03-24 MED ORDER — OXYCODONE-ACETAMINOPHEN 10-325 MG PO TABS: 1.0000 | ORAL_TABLET | ORAL | 0 refills | Status: AC | PRN

## 2024-03-24 NOTE — Plan of Care (Signed)
  Problem: Education: Goal: Ability to describe self-care measures that may prevent or decrease complications (Diabetes Survival Skills Education) will improve Outcome: Adequate for Discharge   Problem: Coping: Goal: Ability to adjust to condition or change in health will improve Outcome: Adequate for Discharge   Problem: Fluid Volume: Goal: Ability to maintain a balanced intake and output will improve Outcome: Adequate for Discharge   Problem: Health Behavior/Discharge Planning: Goal: Ability to identify and utilize available resources and services will improve Outcome: Adequate for Discharge Goal: Ability to manage health-related needs will improve Outcome: Adequate for Discharge   Problem: Metabolic: Goal: Ability to maintain appropriate glucose levels will improve Outcome: Adequate for Discharge   Problem: Nutritional: Goal: Maintenance of adequate nutrition will improve Outcome: Adequate for Discharge Goal: Progress toward achieving an optimal weight will improve Outcome: Adequate for Discharge   Problem: Skin Integrity: Goal: Risk for impaired skin integrity will decrease Outcome: Adequate for Discharge   Problem: Tissue Perfusion: Goal: Adequacy of tissue perfusion will improve Outcome: Adequate for Discharge   Problem: Education: Goal: Knowledge of General Education information will improve Description: Including pain rating scale, medication(s)/side effects and non-pharmacologic comfort measures Outcome: Adequate for Discharge   Problem: Health Behavior/Discharge Planning: Goal: Ability to manage health-related needs will improve Outcome: Adequate for Discharge   Problem: Clinical Measurements: Goal: Ability to maintain clinical measurements within normal limits will improve Outcome: Adequate for Discharge Goal: Will remain free from infection Outcome: Adequate for Discharge Goal: Diagnostic test results will improve Outcome: Adequate for Discharge Goal:  Respiratory complications will improve Outcome: Adequate for Discharge Goal: Cardiovascular complication will be avoided Outcome: Adequate for Discharge   Problem: Activity: Goal: Risk for activity intolerance will decrease Outcome: Adequate for Discharge   Problem: Nutrition: Goal: Adequate nutrition will be maintained Outcome: Adequate for Discharge   Problem: Coping: Goal: Level of anxiety will decrease Outcome: Adequate for Discharge   Problem: Elimination: Goal: Will not experience complications related to bowel motility Outcome: Adequate for Discharge Goal: Will not experience complications related to urinary retention Outcome: Adequate for Discharge   Problem: Pain Managment: Goal: General experience of comfort will improve and/or be controlled Outcome: Adequate for Discharge   Problem: Safety: Goal: Ability to remain free from injury will improve Outcome: Adequate for Discharge   Problem: Skin Integrity: Goal: Risk for impaired skin integrity will decrease Outcome: Adequate for Discharge

## 2024-03-24 NOTE — TOC Transition Note (Signed)
 Transition of Care [facility]) - Discharge Note   Patient Details  Name: Brett White MRN: [id number] Date of Birth: 1961-04-11  Transition of [facility]) CM/SW Contact:  Warren Lindahl A [place], LCSW Phone Number: 03/24/2024, 5:14 PM   Clinical Narrative:     Patient will DC to: [facility]   Anticipated DC date: 03/24/24  Family notified: Pt declined collateral contact  Transport by: [facility]      Per MD patient ready for DC to [facility] . RN, patient, patient's family, and facility notified of DC. Discharge Summary and FL2 sent to facility. RN to call report prior to discharge ( [phone]). DC packet on chart. Ambulance transport requested for patient.     CSW will sign off for now as social work intervention is no longer needed. Please consult us  again if new needs arise.   Final next level of care: Skilled Nursing Facility Barriers to Discharge: Barriers Resolved   Patient Goals and CMS Choice            Discharge Placement              Patient chooses bed at: [facility] Patient to be transferred to facility by: [facility] Name of family member notified: Pt declined collateral contact Patient and family notified of of transfer: 03/24/24  Discharge Plan and Services Additional resources added to the After Visit Summary for                                       Social Drivers of Health (SDOH) Interventions SDOH Screenings   Food Insecurity: No Food Insecurity (03/20/2024)  Housing: Low Risk  (03/22/2024)  Transportation Needs: No Transportation Needs (03/21/2024)  Utilities: Not At Risk (03/21/2024)  Tobacco Use: Low Risk  (03/20/2024)     Readmission Risk Interventions    11/16/2022    2:57 PM  Readmission Risk Prevention Plan  Transportation Screening Complete  Medication Review (RN Care Manager) Complete  PCP or Specialist appointment within 3-5 days of discharge Complete  HRI or Home Care Consult Complete  SW Recovery Care/Counseling  Consult Complete  Palliative Care Screening Complete  Skilled Nursing Facility Complete

## 2024-03-24 NOTE — Evaluation (Signed)
 Occupational Therapy Evaluation Patient Details Name: Brett White MRN: [id number] DOB: 1961-09-10 Today's Date: 03/24/2024   History of Present Illness   The pt is a 63yo male presenting from [facility] 6/15 with middle back and R flank pain, CT showed kidney stones, urine showing UTI, pt d/c back to [facility] with abx. Pt presenting again 6/20 for same, admitted for management. PMH includes: chronic pain, L BKA, DM II, HTN, CKD II, and stroke.     Clinical Impressions Patient evaluated by Occupational Therapy with no further acute OT needs identified. OT goal met. All education has been completed and the patient has no further questions.  See below for any follow-up Occupational Therapy or equipment needs. OT is signing off. Thank you for this referral.?      If plan is discharge home, recommend the following:   Assistance with cooking/housework (Provided at pt's LTC facility)     Functional Status Assessment   Patient has had a recent decline in their functional status and demonstrates the ability to make significant improvements in function in a reasonable and predictable amount of time.     Equipment Recommendations   None recommended by OT     Recommendations for Other Services         Precautions/Restrictions   Precautions Precautions: Fall Recall of Precautions/Restrictions: Intact Restrictions Weight Bearing Restrictions Per Provider Order: No     Mobility Bed Mobility Overal bed mobility: Independent                  Transfers                          Balance Overall balance assessment: Needs assistance Sitting-balance support: No upper extremity supported Sitting balance-Leahy Scale: Good     Standing balance support: Single extremity supported Standing balance-Leahy Scale: Fair Standing balance comment: Fair due to no L prosthetic.                           ADL either performed or assessed with clinical  judgement   ADL Overall ADL's : At baseline                                       General ADL Comments: Pt able to demonstrate UE and LE dressing, bed mobility, transfer to recliner with stand pivot on RLE and cues for line management (IV), grooming seated (pt sits at sink at baseline) and feeding without assistance. Upper body strength WNL. Cognition WNL.     Vision Ability to See in Adequate Light: 2 Moderately impaired Patient Visual Report: (P) Blurring of vision Additional Comments: Pt reports RT eye impaired vision but can see large calender # on wall but not writing on dry erase board on room. Pt has reading glasses and say they are outdated and no longer help. 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     Perception Perception: Within Functional Limits       Praxis Praxis: WFL       Pertinent Vitals/Pain Pain Assessment Pain Assessment: 0-10 Pain Score: 10-Worst pain ever Pain Location: RT lower flank kidney Pain Descriptors / Indicators: Discomfort, Grimacing Pain Intervention(s): Limited activity within patient's tolerance, Monitored during session, Repositioned, Patient requesting pain meds-RN notified, RN gave pain meds during session (Pt medicated via IV prior to mobilizing with OT.)  Extremity/Trunk Assessment Upper Extremity Assessment Upper Extremity Assessment: Overall WFL for tasks assessed   Lower Extremity Assessment Lower Extremity Assessment: Overall WFL for tasks assessed;LLE deficits/detail LLE Deficits / Details: BKA   Cervical / Trunk Assessment Cervical / Trunk Assessment: Normal   Communication Communication Communication: No apparent difficulties   Cognition Arousal: Alert Behavior During Therapy: WFL for tasks assessed/performed Cognition: No apparent impairments                               Following commands: Intact       Cueing  General Comments   Cueing Techniques: Verbal cues (For line  management)      Exercises     Shoulder Instructions      Home Living                                          Prior Functioning/Environment                      OT Problem List: Pain   OT Treatment/Interventions:        OT Goals(Current goals can be found in the care plan section)   Acute Rehab OT Goals Patient Stated Goal: Only pain management. OT Goal Formulation: All assessment and education complete, DC therapy ADL Goals Additional ADL Goal #1: Pt will demonstrate bed mobility, transfer to chair for meals, UE function and cognition WFL, and ADLs at baseline.   OT Frequency:       Co-evaluation              AM-PAC OT 6 Clicks Daily Activity     Outcome Measure Help from another person eating meals?: None Help from another person taking care of personal grooming?: None Help from another person toileting, which includes using toliet, bedpan, or urinal?: A Little Help from another person bathing (including washing, rinsing, drying)?: A Little Help from another person to put on and taking off regular upper body clothing?: None Help from another person to put on and taking off regular lower body clothing?: None 6 Click Score: 22   End of Session Equipment Utilized During Treatment:  (None. Pt refused RW. Used bed rails with one hand to transfer to recliner) Nurse Communication: Other (comment) (Okay for juice?)  Activity Tolerance: Patient tolerated treatment well Patient left: in chair;with call bell/phone within reach;with chair alarm set  OT Visit Diagnosis: Pain Pain - Right/Left: Right Pain - part of body:  (flank)                Time: 8984-8960 OT Time Calculation (min): 24 min Charges:  OT General Charges $OT Visit: 1 Visit OT Evaluation $OT Eval Low Complexity: 1 Low OT Treatments $Self Care/Home Management : 8-22 mins  Brett White, OT Acute Rehab Services Office: [phone] 03/24/2024   Brett White 03/24/2024, 10:49 AM

## 2024-03-24 NOTE — Progress Notes (Signed)
 Pt picked up by transport at 1940. All belongings sent with transport, cell phone, charger, and clothing. No further needs at this time.

## 2024-03-24 NOTE — Discharge Summary (Signed)
 Physician Discharge Summary   Patient: Brett White MRN: [id number] DOB: [birth year]  Admit date:     03/20/2024  Discharge date: 03/24/24  Discharge Physician: Lebron JINNY Cage   PCP: [facility]   Recommendations at discharge:   Follow-up with PCP in 1 week with repeat labs Follow-up with urology as needed for management of kidney stones   Discharge Diagnoses: Principal Problem:   Acute cystitis Active Problems:   Acute kidney injury superimposed on chronic kidney disease (HCC)   Hyperkalemia   Insulin  dependent type 2 diabetes mellitus (HCC)   Essential hypertension   Diabetic polyneuropathy associated with type 2 diabetes mellitus (HCC)   S/P BKA (below knee amputation) unilateral, left (HCC)   GERD without esophagitis   Generalized anxiety disorder   Chronic pain syndrome   History of abdominal aortic aneurysm (AAA)   HLD (hyperlipidemia)   Narcotic dependence (HCC)   Peripheral neuropathy   Reactive airway disease   Insomnia    Hospital Course: Brett White is a 63 y.o. male with medical history significant of HTN, HLD, generalized anxiety disorder, CKD stage II, IBS, chronic pain syndrome, AAA, DM type II, chronic transaminitis, hyperlipidemia,  left BKA, obesity presented to ED, complaining of right-sided low back pain and flank pain which has been ongoing for last 1 week. Patient reported increased urinary frequency, vomited once. Patient reported approximately 4 days ago he was treated for UTI with empiric Keflex .  Unfortunately, urine culture grew E. coli ESBL resistant to Keflex .  Due to persistent symptoms, patient was transported to the ED from rehab.  In the ED, patient hemodynamically stable, UA unremarkable, UC not done.  Labs done showed some AKI with creatinine of 1.7, mild hyperkalemia with potassium of 5.3, no leukocytosis. CT angio chest/abdomen/pelvis showed no evidence of aortic dissection, aneurysm, noted nonobstructing calculus in  the right kidney, no acute finding of the abdomen.  Patient admitted for further management.     Today, patient reports right kidney pain has resolved, denies any nausea/vomiting, fever/chills.  Appears very comfortable.  Reported to RN later today about cough and thinks he has a pneumonia.  Of note, patient has been on broad-spectrum IV antibiotics with ertapenem + Zosyn , which should cover for pneumonia.  Patient hesitant to go back to long-term care at [facility] due to questionable narcotics seeking behavior.  Patient stable to discharge.     Assessment and Plan:  ESBL E. coli UTI Right-sided nonobstructing nephrolithiasis Right-sided renal cyst Currently afebrile, with no leukocytosis Urine culture from 6/15 growing ESBL E. Colil, was on empiric Keflex , received 3 doses of ertapenem prior to admission while at nursing home Repeat UA unremarkable No blood cultures drawn prior to starting antibiotics CT chest abdomen pelvis showed nonobstructing right kidney calculus and renal cyst.  No evidence of pyelonephritis Discussed extensively with urology NP Ole Bourdon on 6/23, noted kidney stones, no obstruction, unlikely to be the reason for right-sided abdominal pain Completed IV antibiotics for total of 7 days (Ertapenem + Zosyn ) S/p IV fluids Continue Flomax    Acute kidney injury superimposed CKD stage II Baseline creatinine around 1.1-1.3, on admission 1.77 S/p IV fluids Repeat BMET in 1 week   Mild hyperkalemia K+ 5.3 S/p Lokelma  5 g one-time dose Repeat BMET in 1 week   Insulin -dependent DM type II Last A1c 8.1 Continue PTA Farxiga , glipizide , Trulicity    Hypertension BP stable Continue amlodipine , reduced losartan  to 50 mg daily (may adjust back to 100 mg if BP uncontrolled), Lasix   Chronic diabetic polyneuropathy Chronic pain syndrome Chronic opioid dependence and tolerance At home patient takes Percocet every 4 hours. Has poor pain tolerance and opioid  dependence Continue Tylenol , Percocet as needed Continue Lyrica   Continue Flexeril  as needed   History of left-sided BKA   Generalized anxiety disorder Continue Zoloft    Hyperlipidemia Continue Lipitor   Insomnia Continue trazodone    Reactive airway disease Continue DuoNeb as needed   IBS Continue Linzess    Chronic constipation Continue MiraLAX  and Senokot   GERD Continue famotidine    Obesity type I Lifestyle modification advised      Pain control - [place]  Controlled Substance Reporting System database was reviewed. and patient was instructed, not to drive, operate heavy machinery, perform activities at heights, swimming or participation in water  activities or provide baby-sitting services while on Pain, Sleep and Anxiety Medications; until their outpatient Physician has advised to do so again. Also recommended to not to take more than prescribed Pain, Sleep and Anxiety Medications.    Consultants: None Procedures performed: None Disposition: Long term care facility Diet recommendation:  Cardiac and Carb modified diet    DISCHARGE MEDICATION: Allergies as of 03/24/2024   No Known Allergies      Medication List     STOP taking these medications    ertapenem 1 g in sodium chloride  0.9 % 100 mL   ipratropium-albuterol  0.5-2.5 (3) MG/3ML Soln Commonly known as: DUONEB   ketorolac  0.4 % Soln Commonly known as: ACULAR        TAKE these medications    acetaminophen  325 MG tablet Commonly known as: TYLENOL  Take 2 tablets (650 mg total) by mouth every 6 (six) hours as needed for mild pain (or Fever >/= 101).   amLODipine  10 MG tablet Commonly known as: NORVASC  Take 10 mg by mouth every morning.   atorvastatin  40 MG tablet Commonly known as: LIPITOR Take 40 mg by mouth at bedtime.   bacitracin -polymyxin b  ophthalmic ointment Commonly known as: POLYSPORIN  Place into the right eye 4 (four) times daily. Place a 1/2 inch ribbon of ointment  into the lower eyelid 4 a day and as needed for discomfort   bisacodyl  5 MG EC tablet Commonly known as: DULCOLAX Take 10 mg by mouth daily as needed (CONSTIPATION).   brimonidine  0.2 % ophthalmic solution Commonly known as: ALPHAGAN  Place 1 drop into the left eye in the morning and at bedtime.   carboxymethylcellulose 1 % ophthalmic solution Place 4 drops into the left eye in the morning, at noon, in the evening, and at bedtime.   cyclobenzaprine  5 MG tablet Commonly known as: FLEXERIL  Take 1 tablet (5 mg total) by mouth 3 (three) times daily as needed for muscle spasms.   DULoxetine  60 MG capsule Commonly known as: CYMBALTA  Take 60 mg by mouth in the morning.   ergocalciferol  1.25 MG (50000 UT) capsule Commonly known as: VITAMIN D2 Take 50,000 Units by mouth once a week. Fridays   erythromycin ophthalmic ointment Place 1 Application into the left eye See admin instructions. Instill 1 ribbon in left eye every 2 hours related to injury conjunctiva and corneal abrasion without foreign body, left eye, sequela   famotidine  20 MG tablet Commonly known as: PEPCID  Take 20 mg by mouth in the morning.   Farxiga  10 MG Tabs tablet Generic drug: dapagliflozin  propanediol Take 10 mg by mouth every morning. Dapaglifozin   furosemide  20 MG tablet Commonly known as: LASIX  Take 20 mg by mouth every morning.   glipiZIDE  10 MG tablet  Commonly known as: GLUCOTROL  Take 10 mg by mouth every morning.   guaiFENesin  100 MG/5ML liquid Commonly known as: ROBITUSSIN Take 10 mLs by mouth every 4 (four) hours as needed for cough or to loosen phlegm.   insulin  lispro 100 UNIT/ML KwikPen Commonly known as: HUMALOG Inject 2-8 Units into the skin 3 (three) times daily with meals. Sliding scale 201-250=2 units 251-300=4 units 301-350=6 units 351-400= 8 units 401 or greater than=call [facility]   Linzess  145 MCG Caps capsule Generic drug: linaclotide  Take 145 mcg by mouth in the morning.    losartan  100 MG tablet Commonly known as: COZAAR  Take 0.5 tablets (50 mg total) by mouth in the morning. What changed: how much to take   Melatonin 5 MG Caps Take 10 mg by mouth at bedtime.   moxifloxacin 0.5 % ophthalmic solution Commonly known as: VIGAMOX Place 1 drop into the left eye in the morning and at bedtime. For 7 days   naloxone  4 MG/0.1ML Liqd nasal spray kit Commonly known as: NARCAN  Place 1 spray into the nose See admin instructions. Every 2-3 minutesas needed until patient response/ emba arrived.   oxyCODONE -acetaminophen  10-325 MG tablet Commonly known as: PERCOCET Take 1 tablet by mouth every 4 (four) hours as needed for up to 3 days for pain.   pantoprazole  40 MG tablet Commonly known as: PROTONIX  Take 1 tablet (40 mg total) by mouth daily. What changed: when to take this   polyethylene glycol 17 g packet Commonly known as: MIRALAX  / GLYCOLAX  Take 17 g by mouth daily. What changed:  when to take this additional instructions   pregabalin  100 MG capsule Commonly known as: LYRICA  Take 1 capsule (100 mg total) by mouth 2 (two) times daily.   sertraline  50 MG tablet Commonly known as: ZOLOFT  Take 50 mg by mouth in the morning.   tamsulosin  0.4 MG Caps capsule Commonly known as: FLOMAX  Take 1 capsule (0.4 mg total) by mouth daily after supper.   traZODone  150 MG tablet Commonly known as: DESYREL  Take 75 mg by mouth at bedtime.   Trulicity  1.5 MG/0.5ML Soaj Generic drug: Dulaglutide  Inject 0.5 mg into the skin once a week. On Thursdays        Contact information for follow-up providers     Rehab, [facility]. Schedule an appointment as soon as possible for a visit in 1 week(s).   Specialty: Skilled Nursing Facility Contact information: [address] [place] [address] [phone]         [facility] Follow up.   Why: follow up for further management of kidney stones Contact information: [address] [place]  [address] [place]  [address] [phone]             Contact information for after-discharge care     Destination     [place] [facility] [place], [place] .   Service: Skilled Nursing Contact information: [address] [place]  [address] [phone]                    Discharge Exam: Fredricka Weights   03/20/24 1349  Weight: 120.7 kg   General: NAD  Cardiovascular: S1, S2 present Respiratory: CTAB Abdomen: Soft, nontender, nondistended, bowel sounds present Musculoskeletal: L BKA, No pedal edema noted Skin: Normal Psychiatry: Normal mood   Condition at discharge: stable  The results of significant diagnostics from this hospitalization (including imaging, microbiology, ancillary and laboratory) are listed below for reference.   Imaging Studies: DG  CHEST PORT 1 VIEW Result Date: 03/24/2024 CLINICAL DATA:  Cough. EXAM: PORTABLE CHEST 1 VIEW COMPARISON:  Chest radiograph dated 03/02/2024. FINDINGS: No focal consolidation, pleural effusion or pneumothorax. The cardiac silhouette is within normal limits. No acute osseous pathology. IMPRESSION: No active disease. Electronically Signed   By: Vanetta Chou M.D.   On: 03/24/2024 14:03   CT Angio Chest/Abd/Pel for Dissection W and/or Wo Contrast Result Date: 03/20/2024 CLINICAL DATA:  Acute aortic syndrome suspected 63 year old male. Chest pain EXAM: CT ANGIOGRAPHY CHEST, ABDOMEN AND PELVIS TECHNIQUE: Non-contrast CT of the chest was initially obtained. Multidetector CT imaging through the chest, abdomen and pelvis was performed using the standard protocol during bolus administration of intravenous contrast. Multiplanar reconstructed images and MIPs were obtained and reviewed to evaluate the vascular anatomy. RADIATION DOSE REDUCTION: This exam was performed according to the departmental dose-optimization program which includes automated exposure control, adjustment of the mA and/or kV according to  patient size and/or use of iterative reconstruction technique. CONTRAST:  OMNIPAQUE  IOHEXOL  350 MG/ML SOLN COMPARISON:  None Available. FINDINGS: CTA CHEST FINDINGS Cardiovascular: Non IV contrast images demonstrate no intramural hematoma within the thoracic aorta. Contrast series demonstrates no aortic dissection or aneurysm. Great vessels are normal. No pericardial fluid. Mediastinum/Nodes: No axillary or supraclavicular adenopathy. No mediastinal or hilar adenopathy. No pericardial fluid. Esophagus normal. Lungs/Pleura: No pulmonary infarction. No pneumonia. No pleural fluid. No pneumothorax Musculoskeletal: No acute findings Review of the MIP images confirms the above findings. CTA ABDOMEN AND PELVIS FINDINGS VASCULAR Aorta: Normal caliber aorta without aneurysm, dissection, vasculitis or significant stenosis. Celiac: Patent without evidence of aneurysm, dissection, vasculitis or significant stenosis. SMA: Patent without evidence of aneurysm, dissection, vasculitis or significant stenosis. Renals: Both renal arteries are patent without evidence of aneurysm, dissection, vasculitis, fibromuscular dysplasia or significant stenosis. IMA: Patent without evidence of aneurysm, dissection, vasculitis or significant stenosis. Inflow: Patent without evidence of aneurysm, dissection, vasculitis or significant stenosis. Veins: No obvious venous abnormality within the limitations of this arterial phase study. Review of the MIP images confirms the above findings. NON-VASCULAR Hepatobiliary: No focal hepatic lesion. Normal gallbladder. No biliary duct dilatation. Common bile duct is normal. Pancreas: Pancreas is normal. No ductal dilatation. No pancreatic inflammation. Spleen: Normal spleen Adrenals/urinary tract: Adrenal glands and kidneys are normal. Small exophytic cystic lesion extending from the lower pole of the RIGHT kidney measures 11 mm. On comparison CT 03/15/2024 this cyst was high-density on noncontrast  imaging. No apparent enhancement on the postcontrast study. 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 Nonobstructing calculus in the RIGHT kidney. Ureters and bladder normal. Stomach/Bowel: The stomach, duodenum, and small bowel normal. The colon and rectosigmoid colon are normal. Vascular/Lymphatic: Abdominal aorta is normal caliber. No periportal or retroperitoneal adenopathy. No pelvic adenopathy. Reproductive: Unremarkable Other: No free fluid. Musculoskeletal: No aggressive osseous lesion. Review of the MIP images confirms the above findings. IMPRESSION: CHEST: 1. No evidence of aortic dissection or aneurysm. 2. No acute pulmonary findings. PELVIS: 1. No evidence of aortic dissection or aneurysm. 2. No acute findings in the abdomen pelvis. 3. Nonobstructing calculus in the RIGHT kidney. 4. 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 JACR 2018 Feb; 264-273, Management of the Incidental Renal Mass on CT, [employer] 2021; 814-848, Bosniak Classification of Cystic Renal Masses, Version 2019. Electronically Signed   By: Jackquline Boxer M.D.   On: 03/20/2024 19:59   DG Chest 2 View Result Date: 03/20/2024 CLINICAL DATA:  Right flank pain and  chest pain. EXAM: CHEST - 2 VIEW COMPARISON:  03/15/2024 FINDINGS: Lateral view degraded by patient arm position. Midline trachea. Mild cardiomegaly. Ascending aortic prominence is similar. No pleural effusion or pneumothorax. No congestive failure. Minimal subsegmental atelectasis or scarring at both lung bases. IMPRESSION: 1. No acute cardiopulmonary disease. 2. Similar ascending aortic prominence.  CTA is pending. Electronically Signed   By: Rockey Kilts M.D.   On: 03/20/2024 17:41   CT Renal Stone Study Result Date: 03/15/2024 EXAM: CT ABDOMEN AND PELVIS WITHOUT CONTRAST 03/15/2024 11:27:44 PM TECHNIQUE: CT of the abdomen and pelvis was performed without the administration of intravenous  contrast. Multiplanar reformatted images are provided for review. Automated exposure control, iterative reconstruction, and/or weight based adjustment of the mA/kV was utilized to reduce the radiation dose to as low as reasonably achievable. COMPARISON: CT abdomen / pelvis dated 06/09/2023. CLINICAL HISTORY: Abdominal/flank pain, stone suspected. Right flank pain for a couple of days, dark, foul smelling urine, WBCs in urine, chronic kidney disease, GERD, HTN, diabetes, appendectomy, back surgery, and I\T\D of back abscess. FINDINGS: LOWER CHEST: No acute abnormality. LIVER: The liver is unremarkable. GALLBLADDER AND BILE DUCTS: Gallbladder is unremarkable. No biliary ductal dilatation. SPLEEN: No acute abnormality. PANCREAS: No acute abnormality. ADRENAL GLANDS: No acute abnormality. KIDNEYS, URETERS AND BLADDER: 2 nonobstructing right lower full renal calculi measuring up to 8 mm (image 43). Punctate nonobstructing left lower pole renal calculus (coronal image 105). 2.7 cm simple left renal cyst (image 36), benign (Bosniak 1). 12 mm hyperdense/hemorrhagic right lower pole renal cyst (image 45), benign. No follow-up is recommended. No hydronephrosis. No perinephric or periureteral stranding. Urinary bladder is unremarkable. GI AND BOWEL: The appendix is not discretely visualized. Stomach demonstrates no acute abnormality. There is no bowel obstruction. No bowel wall thickening. PERITONEUM AND RETROPERITONEUM: No ascites. No free air. VASCULATURE: Aorta is normal in caliber. LYMPH NODES: No lymphadenopathy. REPRODUCTIVE ORGANS: No acute abnormality. BONES AND SOFT TISSUES: Mild-to-moderate degenerative changes of the visualized thoracolumbar spine with prior fusion at L3-4 and L4-5. No acute osseous abnormality. No focal soft tissue abnormality. IMPRESSION: 1. Bilateral nonobstructing renal calculi, measuring up to 8 mm in the right lower kidney. No hydronephrosis. 2. Bilateral renal cysts, benign (Bosniak 1-2). No  follow-up is recommended. Electronically signed by: Pinkie Pebbles MD 03/15/2024 11:44 PM EDT RP Workstation: [id number]   DG Chest Port 1 View Result Date: 03/15/2024 CLINICAL DATA:  Shortness of breath and flank pain. Increased pain with inspiration. EXAM: PORTABLE CHEST 1 VIEW COMPARISON:  CTA chest 11/20/2023 FINDINGS: The heart size and mediastinal contours are within normal limits apart from a slightly aneurysmal ascending aortic shadow, seen previously. There is aortic atherosclerosis. Both lungs are clear. The visualized skeletal structures are unremarkable. IMPRESSION: No evidence of acute chest disease. Again noted slightly aneurysmal ascending aortic shadow. Compare: Interval resolution of bilateral airspace disease noted previously. Electronically Signed   By: Francis Quam M.D.   On: 03/15/2024 21:59   Intravitreal Injection, Pharmacologic Agent - OD - Right Eye Result Date: 03/06/2024 Time Out 03/06/2024. 10:08 AM. Confirmed correct patient, procedure, site, and patient consented. Anesthesia Topical anesthesia was used. Anesthetic medications included Lidocaine  2%, Proparacaine  0.5%. Procedure Preparation included 5% betadine to ocular surface, eyelid speculum. A (32g) needle was used. Injection: 2 mg aflibercept  2 MG/0.05ML   Route: Intravitreal, Site: Right Eye   NDC: D2246706, Lot: [id number], Expiration date: 05/31/2025, Waste: 0 mL Post-op Post injection exam found visual acuity of at least counting fingers. The patient tolerated the procedure  well. There were no complications. The patient received written and verbal post procedure care education.   Intravitreal Injection, Pharmacologic Agent - OS - Left Eye Result Date: 03/06/2024 Time Out 03/06/2024. 10:08 AM. Confirmed correct patient, procedure, site, and patient consented. Anesthesia Topical anesthesia was used. Anesthetic medications included Lidocaine  2%, Proparacaine  0.5%. Procedure Preparation included 5% betadine to ocular  surface, eyelid speculum. A (32g) needle was used. Injection: 2 mg aflibercept  2 MG/0.05ML   Route: Intravitreal, Site: Left Eye   NDC: D2246706, Lot: [id number], Expiration date: 03/30/2025, Waste: 0 mL Post-op Post injection exam found visual acuity of at least counting fingers. The patient tolerated the procedure well. There were no complications. The patient received written and verbal post procedure care education.   OCT, Retina - OU - Both Eyes Result Date: 03/06/2024 Right Eye Quality was good. Central Foveal Thickness: 292. Progression has improved. Findings include no SRF, abnormal foveal contour, epiretinal membrane, intraretinal fluid (interval improvement in IRF / IRHM temporal fovea and macula, +vitreous opacities -- slightly improved). Left Eye Quality was good. Central Foveal Thickness: 363. Progression has worsened. Findings include no SRF, abnormal foveal contour, intraretinal hyper-reflective material (Mild interval increase in IRF / edema temporal and superior fovea and mac under silicone oil). Notes *Images captured and stored on drive Diagnosis / Impression: OD: interval improvement in IRF / IRHM temporal fovea and macula, +vitreous opacities OS: mild interval increase in IRF / edema temporal and superior fovea and mac under silicone oil Clinical management: See below Abbreviations: NFP - Normal foveal profile. CME - cystoid macular edema. PED - pigment epithelial detachment. IRF - intraretinal fluid. SRF - subretinal fluid. EZ - ellipsoid zone. ERM - epiretinal membrane. ORA - outer retinal atrophy. ORT - outer retinal tubulation. SRHM - subretinal hyper-reflective material. IRHM - intraretinal hyper-reflective material    Microbiology: Results for orders placed or performed during the hospital encounter of 03/20/24  MRSA Next Gen by PCR, Nasal     Status: None   Collection Time: 03/23/24  9:36 AM   Specimen: Nasal Mucosa; Nasal Swab  Result Value Ref Range Status   MRSA by PCR  Next Gen NOT DETECTED NOT DETECTED Final    Comment: (NOTE) The GeneXpert MRSA Assay (FDA approved for NASAL specimens only), is one component of a comprehensive MRSA colonization surveillance program. It is not intended to diagnose MRSA infection nor to guide or monitor treatment for MRSA infections. Test performance is not FDA approved in patients less than 43 years old. Performed at [facility], [address] [place], [place] [address]     Labs: CBC: Recent Labs  Lab 03/20/24 1437 03/21/24 0603 03/24/24 0935  WBC 8.3 8.7 5.8  NEUTROABS 6.1  --  3.3  HGB 12.9* 11.5* 12.1*  HCT 42.7 38.2* 40.3  MCV 79.2* 79.9* 80.0  PLT 513* 387 384   Basic Metabolic Panel: Recent Labs  Lab 03/20/24 1437 03/21/24 0603 03/22/24 0345 03/23/24 0909 03/24/24 0935  NA 134* 136 133* 135 138  K 5.3* 5.0 4.5 4.4 4.1  CL 98 104 102 100 103  CO2 26 27 28 26 25   GLUCOSE 171* 136* 153* 169* 174*  BUN 24* 20 16 15  22*  CREATININE 1.77* 1.53* 1.72* 1.35* 1.32*  CALCIUM  9.5 8.5* 8.2* 8.8* 9.0   Liver Function Tests: Recent Labs  Lab 03/20/24 1437 03/21/24 0603  AST 41 36  ALT 56* 43  ALKPHOS 101 89  BILITOT 0.4 0.5  PROT 7.6 6.3*  ALBUMIN   3.6 3.0*   CBG: Recent Labs  Lab 03/23/24 1147 03/23/24 1625 03/23/24 2110 03/24/24 0736 03/24/24 1144  GLUCAP 156* 183* 194* 171* 289*    Discharge time spent: greater than 30 minutes.  Signed: Lebron JINNY Cage, MD [facility] 03/24/2024

## 2024-03-24 NOTE — Progress Notes (Signed)
 Report called to [facility].  All questions answered.  Awaiting discharge order and transport.

## 2024-03-24 NOTE — [place] FL2 (Signed)
 [place]  MEDICAID FL2 LEVEL OF CARE FORM     IDENTIFICATION  Patient Name: Brett White Birthdate: 02-26-1961 Sex: male Admission Date (Current Location): 03/20/2024  [facility] and [place] Number:  Producer, television/film/video and Address:  [facility], [address], [place], [place] [address]      Provider Number: [id number]  Attending Physician Name and Address:  Donnamarie Lebron PARAS, MD  Relative Name and Phone Number:  Hodges Browning (Relative)  [phone]    Current Level of Care: Hospital Recommended Level of Care: Skilled Nursing Facility Prior Approval Number:    Date Approved/Denied:   PASRR Number: [id number]  Discharge Plan: SNF    Current Diagnoses: Patient Active Problem List   Diagnosis Date Noted   Acute cystitis 03/20/2024   Hyperkalemia 03/20/2024   Generalized anxiety disorder 03/20/2024   Chronic pain syndrome 03/20/2024   History of abdominal aortic aneurysm (AAA) 03/20/2024   HLD (hyperlipidemia) 03/20/2024   Narcotic dependence (HCC) 03/20/2024   Peripheral neuropathy 03/20/2024   Reactive airway disease 03/20/2024   Insomnia 03/20/2024   Unspecified open wound, right foot, initial encounter 05/14/2023   Pneumonia 11/16/2022   Acute respiratory failure with hypoxia (HCC) 11/16/2022   Sepsis with acute organ dysfunction (HCC) 11/16/2022   Aspiration pneumonia (HCC) 11/16/2022   GERD without esophagitis 06/11/2022   Mixed diabetic hyperlipidemia associated with type 2 diabetes mellitus (HCC) 06/11/2022   Adjustment disorder with depressed mood 05/23/2022   Chronic hyponatremia 12/09/2021   Uncontrolled type 2 diabetes mellitus with hyperglycemia, without long-term current use of insulin  (HCC) 12/09/2021   Tachycardia 12/09/2021   Acute kidney injury superimposed on chronic kidney disease (HCC) 10/04/2021   S/P BKA (below knee amputation) unilateral, left (HCC) 04/05/2021   Insulin  dependent type 2 diabetes mellitus  (HCC) 02/03/2021   Essential hypertension 02/03/2021   Diabetic polyneuropathy associated with type 2 diabetes mellitus (HCC) 02/03/2021    Orientation RESPIRATION BLADDER Height & Weight     Self, Time, Situation, Place  Normal Continent Weight: 266 lb (120.7 kg) Height:  6' 2 (188 cm)  BEHAVIORAL SYMPTOMS/MOOD NEUROLOGICAL BOWEL NUTRITION STATUS      Continent Diet (see DC summary)  AMBULATORY STATUS COMMUNICATION OF NEEDS Skin   Limited Assist Verbally Other (Comment) (Wound / Incision (Open or Dehisced) 06/10/22 Diabetic ulcer Foot Right;Mid plantar surface; initially ruptured blister)                       Personal Care Assistance Level of Assistance  Bathing, Feeding, Dressing Bathing Assistance: Limited assistance Feeding assistance: Limited assistance Dressing Assistance: Limited assistance     Functional Limitations Info  Sight, Hearing, Speech Sight Info: Adequate Hearing Info: Adequate Speech Info: Adequate    SPECIAL CARE FACTORS FREQUENCY                       Contractures Contractures Info: Not present    Additional Factors Info  Code Status, Allergies Code Status Info: FULL Allergies Info: NKA           Current Medications (03/24/2024):  This is the current hospital active medication list Current Facility-Administered Medications  Medication Dose Route Frequency Provider Last Rate Last Admin   acetaminophen  (TYLENOL ) tablet 650 mg  650 mg Oral Q6H PRN Sundil, Subrina, MD       Or   acetaminophen  (TYLENOL ) suppository 650 mg  650 mg Rectal Q6H PRN Sundil, Subrina, MD  amLODipine  (NORVASC ) tablet 10 mg  10 mg Oral q morning Ezenduka, Nkeiruka J, MD   10 mg at 03/24/24 0850   artificial tears ophthalmic solution 1 drop  1 drop Left Eye QID Cyndy Ozell DASEN, RPH   1 drop at 03/24/24 1432   atorvastatin  (LIPITOR) tablet 40 mg  40 mg Oral QHS Sundil, Subrina, MD   40 mg at 03/23/24 2159   bacitracin -polymyxin b  (POLYSPORIN ) ophthalmic  ointment   Right Eye QID Ezenduka, Nkeiruka J, MD   Given at 03/24/24 1432   bisacodyl  (DULCOLAX) EC tablet 10 mg  10 mg Oral Daily PRN Sundil, Subrina, MD       cyclobenzaprine  (FLEXERIL ) tablet 5 mg  5 mg Oral TID PRN Sundil, Subrina, MD   5 mg at 03/24/24 1429   enoxaparin  (LOVENOX ) injection 40 mg  40 mg Subcutaneous Q24H Sundil, Subrina, MD   40 mg at 03/23/24 2200   erythromycin ophthalmic ointment 1 Application  1 Application Left Eye Q2H Ezenduka, Nkeiruka J, MD   1 Application at 03/24/24 1432   famotidine  (PEPCID ) tablet 20 mg  20 mg Oral Daily Sundil, Subrina, MD   20 mg at 03/24/24 9148   HYDROmorphone  (DILAUDID ) injection 1 mg  1 mg Intravenous Q4H PRN Sundil, Subrina, MD   1 mg at 03/24/24 1019   insulin  aspart (novoLOG ) injection 0-5 Units  0-5 Units Subcutaneous QHS Sundil, Subrina, MD       insulin  aspart (novoLOG ) injection 0-9 Units  0-9 Units Subcutaneous TID WC Ezenduka, Nkeiruka J, MD   5 Units at 03/24/24 1159   ipratropium-albuterol  (DUONEB) 0.5-2.5 (3) MG/3ML nebulizer solution 3 mL  3 mL Nebulization Q6H PRN Sundil, Subrina, MD       linaclotide  (LINZESS ) capsule 145 mcg  145 mcg Oral Daily Sundil, Subrina, MD   145 mcg at 03/24/24 0850   naloxone  (NARCAN ) injection 0.4 mg  0.4 mg Intravenous PRN Sundil, Subrina, MD       ondansetron  (ZOFRAN ) tablet 4 mg  4 mg Oral Q6H PRN Sundil, Subrina, MD       Or   ondansetron  (ZOFRAN ) injection 4 mg  4 mg Intravenous Q6H PRN Sundil, Subrina, MD       oxyCODONE -acetaminophen  (PERCOCET/ROXICET) 5-325 MG per tablet 1 tablet  1 tablet Oral Q6H PRN Sundil, Subrina, MD   1 tablet at 03/24/24 1429   And   oxyCODONE  (Oxy IR/ROXICODONE ) immediate release tablet 5 mg  5 mg Oral Q6H PRN Sundil, Subrina, MD   5 mg at 03/24/24 1429   piperacillin -tazobactam (ZOSYN ) IVPB 3.375 g  3.375 g Intravenous Q8H Sundil, Subrina, MD 12.5 mL/hr at 03/24/24 1432 3.375 g at 03/24/24 1432   polyethylene glycol (MIRALAX  / GLYCOLAX ) packet 17 g  17 g Oral Daily  Sundil, Subrina, MD   17 g at 03/21/24 9177   pregabalin  (LYRICA ) capsule 100 mg  100 mg Oral QID Sundil, Subrina, MD   100 mg at 03/24/24 1429   senna-docusate (Senokot-S) tablet 1 tablet  1 tablet Oral QHS Sundil, Subrina, MD   1 tablet at 03/23/24 2159   sertraline  (ZOLOFT ) tablet 50 mg  50 mg Oral Daily Sundil, Subrina, MD   50 mg at 03/24/24 0851   sodium chloride  flush (NS) 0.9 % injection 3 mL  3 mL Intravenous Q12H Sundil, Subrina, MD   3 mL at 03/23/24 2200   sodium chloride  flush (NS) 0.9 % injection 3 mL  3 mL Intravenous Q12H Sundil, Subrina, MD   3  mL at 03/24/24 9147   sodium chloride  flush (NS) 0.9 % injection 3 mL  3 mL Intravenous PRN Sundil, Subrina, MD       tamsulosin  (FLOMAX ) capsule 0.4 mg  0.4 mg Oral QPC supper Sundil, Subrina, MD   0.4 mg at 03/23/24 1735   traZODone  (DESYREL ) tablet 75 mg  75 mg Oral QHS Sundil, Subrina, MD   75 mg at 03/23/24 2158     Discharge Medications: Please see discharge summary for a list of discharge medications.  Relevant Imaging Results:  Relevant Lab Results:   Additional Information SSN: [id number]  Mariette Cowley A [place], LCSW

## 2024-03-28 ENCOUNTER — Encounter (HOSPITAL_COMMUNITY): Payer: Self-pay

## 2024-03-28 ENCOUNTER — Emergency Department (HOSPITAL_COMMUNITY)
Admission: EM | Admit: 2024-03-28 | Discharge: 2024-03-28 | Disposition: A | Discharge status: Home / Self Care | Attending: Emergency Medicine | Admitting: Emergency Medicine

## 2024-03-28 ENCOUNTER — Emergency Department (HOSPITAL_COMMUNITY)

## 2024-03-28 DIAGNOSIS — K429 Umbilical hernia without obstruction or gangrene: Secondary | ICD-10-CM | POA: Insufficient documentation

## 2024-03-28 DIAGNOSIS — I700 Atherosclerosis of aorta: Secondary | ICD-10-CM | POA: Insufficient documentation

## 2024-03-28 DIAGNOSIS — Z794 Long term (current) use of insulin: Secondary | ICD-10-CM | POA: Insufficient documentation

## 2024-03-28 DIAGNOSIS — N200 Calculus of kidney: Secondary | ICD-10-CM | POA: Diagnosis not present

## 2024-03-28 DIAGNOSIS — M545 Low back pain, unspecified: Secondary | ICD-10-CM | POA: Insufficient documentation

## 2024-03-28 LAB — CBC WITH DIFFERENTIAL/PLATELET
Abs Immature Granulocytes: 0.01 10*3/uL (ref 0.00–0.07)
Basophils Absolute: 0 10*3/uL (ref 0.0–0.1)
Basophils Relative: 1 %
Eosinophils Absolute: 0.1 10*3/uL (ref 0.0–0.5)
Eosinophils Relative: 3 %
HCT: 40 % (ref 39.0–52.0)
Hemoglobin: 12.2 g/dL — ABNORMAL LOW (ref 13.0–17.0)
Immature Granulocytes: 0 %
Lymphocytes Relative: 33 %
Lymphs Abs: 1.7 10*3/uL (ref 0.7–4.0)
MCH: 24.4 pg — ABNORMAL LOW (ref 26.0–34.0)
MCHC: 30.5 g/dL (ref 30.0–36.0)
MCV: 80 fL (ref 80.0–100.0)
Monocytes Absolute: 0.5 10*3/uL (ref 0.1–1.0)
Monocytes Relative: 10 %
Neutro Abs: 2.7 10*3/uL (ref 1.7–7.7)
Neutrophils Relative %: 53 %
Platelets: 396 10*3/uL (ref 150–400)
RBC: 5 MIL/uL (ref 4.22–5.81)
RDW: 17.2 % — ABNORMAL HIGH (ref 11.5–15.5)
WBC: 5.1 10*3/uL (ref 4.0–10.5)
nRBC: 0 % (ref 0.0–0.2)

## 2024-03-28 LAB — URINALYSIS, ROUTINE W REFLEX MICROSCOPIC
Bacteria, UA: NONE SEEN
Bilirubin Urine: NEGATIVE
Glucose, UA: >=500 mg/dL — AB
Hgb urine dipstick: NEGATIVE
Ketones, ur: NEGATIVE mg/dL
Leukocytes,Ua: NEGATIVE
Nitrite: NEGATIVE
Protein, ur: NEGATIVE mg/dL
Specific Gravity, Urine: 1.014 (ref 1.005–1.030)
pH: 8 (ref 5.0–8.0)

## 2024-03-28 LAB — COMPREHENSIVE METABOLIC PANEL WITH GFR
ALT: 43 U/L (ref 0–44)
AST: 35 U/L (ref 15–41)
Albumin: 3.4 g/dL — ABNORMAL LOW (ref 3.5–5.0)
Alkaline Phosphatase: 83 U/L (ref 38–126)
Anion gap: 11 (ref 5–15)
BUN: 17 mg/dL (ref 6–20)
CO2: 24 mmol/L (ref 22–32)
Calcium: 9 mg/dL (ref 8.9–10.3)
Chloride: 105 mmol/L (ref 98–111)
Creatinine, Ser: 1.24 mg/dL (ref 0.61–1.24)
GFR, Estimated: >60 mL/min (ref >60)
Glucose, Bld: 250 mg/dL — ABNORMAL HIGH (ref 70–99)
Potassium: 4.8 mmol/L (ref 3.5–5.1)
Sodium: 140 mmol/L (ref 135–145)
Total Bilirubin: 0.4 mg/dL (ref 0.0–1.2)
Total Protein: 6.9 g/dL (ref 6.5–8.1)

## 2024-03-28 LAB — LIPASE, BLOOD: Lipase: 34 U/L (ref 11–51)

## 2024-03-28 MED ORDER — DICLOFENAC SODIUM 1 % EX GEL: 4.0000 g | Freq: Four times a day (QID) | CUTANEOUS | 0 refills | Status: DC

## 2024-03-28 MED ORDER — MORPHINE SULFATE (PF) 4 MG/ML IV SOLN
4.0000 mg | Freq: Once | INTRAVENOUS | Status: AC
  Administered 2024-03-28: 4 mg via INTRAVENOUS
  Filled 2024-03-28: qty 1

## 2024-03-28 MED ORDER — METHYLPREDNISOLONE 4 MG PO TBPK: ORAL_TABLET | ORAL | 0 refills | Status: DC

## 2024-03-28 MED ORDER — DIAZEPAM 5 MG PO TABS
5.0000 mg | ORAL_TABLET | Freq: Once | ORAL | Status: AC
  Administered 2024-03-28: 5 mg via ORAL
  Filled 2024-03-28: qty 1

## 2024-03-28 MED ORDER — SODIUM CHLORIDE 0.9 % IV BOLUS
1000.0000 mL | Freq: Once | INTRAVENOUS | Status: AC
  Administered 2024-03-28: 1000 mL via INTRAVENOUS

## 2024-03-28 MED ORDER — ONDANSETRON HCL 4 MG/2ML IJ SOLN
4.0000 mg | Freq: Once | INTRAMUSCULAR | Status: AC
  Administered 2024-03-28: 4 mg via INTRAVENOUS
  Filled 2024-03-28: qty 2

## 2024-03-28 MED ORDER — ACETAMINOPHEN 500 MG PO TABS
1000.0000 mg | ORAL_TABLET | Freq: Once | ORAL | Status: AC
  Administered 2024-03-28: 1000 mg via ORAL
  Filled 2024-03-28: qty 2

## 2024-03-28 MED ORDER — KETOROLAC TROMETHAMINE 15 MG/ML IJ SOLN
15.0000 mg | Freq: Once | INTRAMUSCULAR | Status: AC
  Administered 2024-03-28: 15 mg via INTRAVENOUS
  Filled 2024-03-28: qty 1

## 2024-03-28 NOTE — ED Notes (Signed)
 Called for pt pick up [facility]

## 2024-03-28 NOTE — ED Notes (Signed)
 Pt requesting to be medicated again for 10/10 pain. Provider notified.

## 2024-03-28 NOTE — ED Provider Notes (Signed)
 [facility] EMERG[facility] DEPARTMENT AT [facility] Provider Note   CSN: [id number] Arrival date & time: 03/28/24  9364     Patient presents with: Flank Pain   Brett White is a 63 y.o. male.   63 yo M with a chief complaints of right flank pain.  Pain to the right low back denies radiation denies trauma.  He has chronic neuropathy.  Does not feel like that is changed.  Feels like he is been urinating less.  He was seen recently and was diagnosed with a complicated urinary tract infection requiring admission.  No fevers.   Flank Pain       Prior to Admission medications   Medication Sig Start Date End Date Taking? Authorizing Provider  acetaminophen  (TYLENOL ) 325 MG tablet Take 2 tablets (650 mg total) by mouth every 6 (six) hours as needed for mild pain (or Fever >/= 101). 06/13/22   Jadine Toribio SQUIBB, MD  amLODipine  (NORVASC ) 10 MG tablet Take 10 mg by mouth every morning. 05/11/22   [provider]  atorvastatin  (LIPITOR) 40 MG tablet Take 40 mg by mouth at bedtime. 11/07/21   [provider]  bacitracin -polymyxin b  (POLYSPORIN ) ophthalmic ointment Place into the right eye 4 (four) times daily. Place a 1/2 inch ribbon of ointment into the lower eyelid 4 a day and as needed for discomfort 08/01/23   Valdemar Rogue, MD  bisacodyl  (DULCOLAX) 5 MG EC tablet Take 10 mg by mouth daily as needed (CONSTIPATION). Patient not taking: Reported on 03/20/2024    [provider]  brimonidine  (ALPHAGAN ) 0.2 % ophthalmic solution Place 1 drop into the left eye in the morning and at bedtime. 08/29/23   [provider]  carboxymethylcellulose 1 % ophthalmic solution Place 4 drops into the left eye in the morning, at noon, in the evening, and at bedtime.    [provider]  cyclobenzaprine  (FLEXERIL ) 5 MG tablet Take 1 tablet (5 mg total) by mouth 3 (three) times daily as needed for muscle spasms. 06/13/22   Jadine Toribio SQUIBB, MD  diclofenac  Sodium  (VOLTAREN ) 1 % GEL Apply 4 g topically 4 (four) times daily. 03/28/24   Emil Share, DO  DULoxetine  (CYMBALTA ) 60 MG capsule Take 60 mg by mouth in the morning. 08/28/23   [provider]  ergocalciferol  (VITAMIN D2) 1.25 MG (50000 UT) capsule Take 50,000 Units by mouth once a week. Fridays    [provider]  erythromycin  ophthalmic ointment Place 1 Application into the left eye See admin instructions. Instill 1 ribbon in left eye every 2 hours related to injury conjunctiva and corneal abrasion without foreign body, left eye, sequela    [provider]  famotidine  (PEPCID ) 20 MG tablet Take 20 mg by mouth in the morning. 04/20/23   [provider]  FARXIGA  10 MG TABS tablet Take 10 mg by mouth every morning. Dapaglifozin 02/07/22   [provider]  furosemide  (LASIX ) 20 MG tablet Take 20 mg by mouth every morning. 05/11/22   [provider]  glipiZIDE  (GLUCOTROL ) 10 MG tablet Take 10 mg by mouth every morning. 05/11/22   [provider]  guaiFENesin  (ROBITUSSIN) 100 MG/5ML liquid Take 10 mLs by mouth every 4 (four) hours as needed for cough or to loosen phlegm.    [provider]  insulin  lispro (HUMALOG) 100 UNIT/ML KwikPen Inject 2-8 Units into the skin 3 (three) times daily with meals. Sliding scale 201-250=2 units 251-300=4 units 301-350=6 units 351-400= 8 units 401  or greater than=call [facility] 04/18/23   [provider]  LINZESS  145 MCG CAPS capsule Take 145 mcg by mouth in the morning. 06/11/23   [provider]  losartan  (COZAAR ) 100 MG tablet Take 0.5 tablets (50 mg total) by mouth in the morning. 03/24/24   Ezenduka, Nkeiruka J, MD  Melatonin 5 MG CAPS Take 10 mg by mouth at bedtime.    [provider]  methylPREDNISolone (MEDROL DOSEPAK) 4 MG TBPK tablet Day 1: 8mg  before breakfast, 4 mg after lunch, 4 mg after supper, and 8 mg at bedtime Day 2: 4 mg before breakfast, 4 mg after lunch, 4 mg  after  supper, and 8 mg  at bedtime Day 3:  4 mg  before breakfast, 4 mg  after lunch, 4 mg after supper, and 4 mg  at bedtime Day 4: 4 mg  before breakfast, 4 mg  after lunch, and 4 mg at bedtime Day 5: 4 mg  before breakfast and 4 mg at bedtime Day 6: 4 mg  before breakfast 03/28/24   Preciliano Castell, DO  moxifloxacin (VIGAMOX) 0.5 % ophthalmic solution Place 1 drop into the left eye in the morning and at bedtime. For 7 days    [provider]  naloxone  (NARCAN ) nasal spray 4 mg/0.1 mL Place 1 spray into the nose See admin instructions. Every 2-3 minutesas needed until patient response/ emba arrived. 11/18/22   Krishnan, Gokul, MD  pantoprazole  (PROTONIX ) 40 MG tablet Take 1 tablet (40 mg total) by mouth daily. Patient taking differently: Take 40 mg by mouth in the morning. 10/20/21   Ghimire, Donalda HERO, MD  polyethylene glycol (MIRALAX  / GLYCOLAX ) 17 g packet Take 17 g by mouth daily. Patient taking differently: Take 17 g by mouth in the morning. With 4 oz of fluids and drink 11/18/22   Krishnan, Gokul, MD  pregabalin  (LYRICA ) 100 MG capsule Take 1 capsule (100 mg total) by mouth 2 (two) times daily. 11/18/22   Krishnan, Gokul, MD  sertraline  (ZOLOFT ) 50 MG tablet Take 50 mg by mouth in the morning. 04/30/23   [provider]  tamsulosin  (FLOMAX ) 0.4 MG CAPS capsule Take 1 capsule (0.4 mg total) by mouth daily after supper. 03/24/24 04/23/24  Ezenduka, Nkeiruka J, MD  traZODone  (DESYREL ) 150 MG tablet Take 75 mg by mouth at bedtime. 05/30/23   [provider]  TRULICITY  1.5 MG/0.5ML SOPN Inject 0.5 mg into the skin once a week. On Thursdays 04/04/23   [provider]    Allergies: Patient has no known allergies.    Review of Systems  Genitourinary:  Positive for flank pain.    Updated Vital Signs BP 134/72 (BP Location: Right Arm)   Pulse 74   Temp 97.8 F (36.6 C)   Resp 16   Ht 6' 2 (1.88 m)   Wt 120.7 kg   SpO2 96%   BMI 34.15 kg/m   Physical Exam Vitals and  nursing note reviewed.  Constitutional:      Appearance: He is well-developed.  HENT:     Head: Normocephalic and atraumatic.   Eyes:     Pupils: Pupils are equal, round, and reactive to light.   Neck:     Vascular: No JVD.   Cardiovascular:     Rate and Rhythm: Normal rate and regular rhythm.     Heart sounds: No murmur heard.    No friction rub. No gallop.  Pulmonary:     Effort: No respiratory distress.  Breath sounds: No wheezing.  Abdominal:     General: There is no distension.     Tenderness: There is no abdominal tenderness. There is no guarding or rebound.   Musculoskeletal:        General: Tenderness present. Normal range of motion.     Cervical back: Normal range of motion and neck supple.     Comments: No obvious midline spinal tenderness step-offs deformities.  Patient has right paraspinal musculature tenderness and spasm.  No obvious CVA tenderness.  Pulse and motor intact to the right lower extremity.  He has a left BKA.  No clonus.  Reflexes 1+.   Skin:    Coloration: Skin is not pale.     Findings: No rash.   Neurological:     Mental Status: He is alert and oriented to person, place, and time.   Psychiatric:        Behavior: Behavior normal.     (all labs ordered are listed, but only abnormal results are displayed) Labs Reviewed  CBC WITH DIFFERENTIAL/PLATELET - Abnormal; Notable for the following components:      Result Value   Hemoglobin 12.2 (*)    MCH 24.4 (*)    RDW 17.2 (*)    All other components within normal limits  COMPREHENSIVE METABOLIC PANEL WITH GFR - Abnormal; Notable for the following components:   Glucose, Bld 250 (*)    Albumin  3.4 (*)    All other components within normal limits  URINALYSIS, ROUTINE W REFLEX MICROSCOPIC - Abnormal; Notable for the following components:   Color, Urine STRAW (*)    Glucose, UA >=500 (*)    All other components within normal limits  LIPASE, BLOOD    EKG: None  Radiology: CT Renal  Stone Study Result Date: 03/28/2024 CLINICAL DATA:  Abdominal/flank pain, stone suspected. EXAM: CT ABDOMEN AND PELVIS WITHOUT CONTRAST TECHNIQUE: Multidetector CT imaging of the abdomen and pelvis was performed following the standard protocol without IV contrast. RADIATION DOSE REDUCTION: This exam was performed according to the departmental dose-optimization program which includes automated exposure control, adjustment of the mA and/or kV according to patient size and/or use of iterative reconstruction technique. COMPARISON:  03/20/2024 and 03/15/2024 FINDINGS: Lower chest: Again noted are streaky densities at the lung bases suggestive for scarring and/or atelectasis. No pleural effusions. Hepatobiliary: Normal appearance of the liver and gallbladder. Pancreas: Unremarkable. No pancreatic ductal dilatation or surrounding inflammatory changes. Spleen: Normal in size without focal abnormality. Adrenals/Urinary Tract: Normal adrenal glands. Chronic perinephric stranding or edema. Negative for hydronephrosis. Normal urinary bladder. Multiple right kidney stones, largest measures 1.1 cm in lower pole. Again noted is a left renal sinus cyst. Small hyperdense exophytic structure in the right kidney lower pole measures 1.1 cm and minimally changed since 2024. This is likely a hyperdense cyst. These renal cysts do not require dedicated follow-up. Tiny stone in left kidney lower pole. Stomach/Bowel: Moderate stool burden. No bowel dilatation or obstruction. No focal bowel inflammation. Normal appearance of the stomach. Vascular/Lymphatic: Atherosclerotic calcifications in the abdominal aorta without aneurysm. No significant lymph node enlargement in the abdomen or pelvis. Reproductive: Prostate is unremarkable. Other: Negative for free fluid. Negative for free air. Small umbilical hernia containing fat. Musculoskeletal: Interbody fusion at L3-L4 and L4-L5. Disc space narrowing at L5-S1. No acute bone abnormality.  IMPRESSION: 1. No acute abnormality in the abdomen or pelvis. 2. Nonobstructive bilateral renal calculi. Largest stone measures 1.1 cm in the right kidney lower pole. 3. Moderate stool burden. 4.  Umbilical hernia. 5. Aortic Atherosclerosis (ICD10-I70.0). Electronically Signed   By: Juliene Balder M.D.   On: 03/28/2024 09:45     Procedures   Medications Ordered in the ED  morphine  (PF) 4 MG/ML injection 4 mg (4 mg Intravenous Given 03/28/24 0704)  sodium chloride  0.9 % bolus 1,000 mL (0 mLs Intravenous Stopped 03/28/24 0844)  morphine  (PF) 4 MG/ML injection 4 mg (4 mg Intravenous Given 03/28/24 0739)  ondansetron  (ZOFRAN ) injection 4 mg (4 mg Intravenous Given 03/28/24 0739)  ketorolac  (TORADOL ) 15 MG/ML injection 15 mg (15 mg Intravenous Given 03/28/24 0849)  diazepam (VALIUM) tablet 5 mg (5 mg Oral Given 03/28/24 0848)  acetaminophen  (TYLENOL ) tablet 1,000 mg (1,000 mg Oral Given 03/28/24 0848)  morphine  (PF) 4 MG/ML injection 4 mg (4 mg Intravenous Given 03/28/24 0849)                                    Medical Decision Making Amount and/or Complexity of Data Reviewed Radiology: ordered.  Risk OTC drugs. Prescription drug management.   62 yo M with a chief complaints of right-sided low back pain.  This has been going on for a few days.  Patient was recently admitted for the same.  At that time there was some concern for a complicated urinary tract infection.  By history and exam that seems more likely to be musculoskeletal back pain.  He has no obvious CVA tenderness for me.  He does appear to be uncomfortable.  Will repeat CT imaging here.  CT without obvious acute intra-abdominal pathology.  Will treat as musculoskeletal pain.  Be discharged home.  PCP Follow up.  3:19 PM:  I have discussed the diagnosis/risks/treatment options with the patient.  Evaluation and diagnostic testing in the emergency department does not suggest an emergent condition requiring admission or immediate  intervention beyond what has been performed at this time.  They will follow up with PCP. We also discussed returning to the ED immediately if new or worsening sx occur. We discussed the sx which are most concerning (e.g., sudden worsening pain, fever, inability to tolerate by mouth) that necessitate immediate return. Medications administered to the patient during their visit and any new prescriptions provided to the patient are listed below.  Medications given during this visit Medications  morphine  (PF) 4 MG/ML injection 4 mg (4 mg Intravenous Given 03/28/24 0704)  sodium chloride  0.9 % bolus 1,000 mL (0 mLs Intravenous Stopped 03/28/24 0844)  morphine  (PF) 4 MG/ML injection 4 mg (4 mg Intravenous Given 03/28/24 0739)  ondansetron  (ZOFRAN ) injection 4 mg (4 mg Intravenous Given 03/28/24 0739)  ketorolac  (TORADOL ) 15 MG/ML injection 15 mg (15 mg Intravenous Given 03/28/24 0849)  diazepam (VALIUM) tablet 5 mg (5 mg Oral Given 03/28/24 0848)  acetaminophen  (TYLENOL ) tablet 1,000 mg (1,000 mg Oral Given 03/28/24 0848)  morphine  (PF) 4 MG/ML injection 4 mg (4 mg Intravenous Given 03/28/24 0849)     The patient appears reasonably screen and/or stabilized for discharge and I doubt any other medical condition or other [facility] requiring further screening, evaluation, or treatment in the ED at this time prior to discharge.       Final diagnoses:  Acute right-sided low back pain without sciatica    ED Discharge Orders          Ordered    methylPREDNISolone (MEDROL DOSEPAK) 4 MG TBPK tablet  Status:  Discontinued  03/28/24 0950    diclofenac  Sodium (VOLTAREN ) 1 % GEL  4 times daily,   Status:  Discontinued        03/28/24 0950    diclofenac  Sodium (VOLTAREN ) 1 % GEL  4 times daily,   Status:  Discontinued        03/28/24 0950    methylPREDNISolone (MEDROL DOSEPAK) 4 MG TBPK tablet  Status:  Discontinued        03/28/24 0950    diclofenac  Sodium (VOLTAREN ) 1 % GEL  4 times daily        03/28/24  1022    methylPREDNISolone (MEDROL DOSEPAK) 4 MG TBPK tablet        03/28/24 1022               [place], DO 03/28/24 1519

## 2024-03-28 NOTE — ED Provider Triage Note (Signed)
 Emergency Medicine Provider Triage Evaluation Note  Brett White , a 63 y.o. male  was evaluated in triage.  Pt complains of right flank pain.  Was just discharged from [facility], found to have nephrolithiasis.  Review of Systems  Positive:  Negative:   Physical Exam  BP (!) 158/87 (BP Location: Right Arm)   Pulse 85   Temp 98.2 F (36.8 C)   Resp 18   Ht 6' 2 (1.88 m)   Wt 120.7 kg   SpO2 100%   BMI 34.15 kg/m  Gen:   Awake, appears uncomfortable Resp:  Normal effort  MSK:   Moves extremities without difficulty  Other:  Positive CVAT  Medical Decision Making  Medically screening exam initiated at 6:46 AM.  Appropriate orders placed.  Brett White was informed that the remainder of the evaluation will be completed by another provider, this initial triage assessment does not replace that evaluation, and the importance of remaining in the ED until their evaluation is complete.     Brett, [place]iet[place], PA-C 03/28/24 [phone]

## 2024-03-28 NOTE — ED Notes (Signed)
Secretary to call [facility]   

## 2024-03-28 NOTE — Discharge Instructions (Addendum)
Your back pain is most likely due to a muscular strain.  There is been a lot of research on back pain, unfortunately the only thing that seems to really help is Tylenol and ibuprofen.  Relative rest is also important to not lift greater than 10 pounds bending or twisting at the waist.  Please follow-up with your family physician.  The other thing that really seems to benefit patients is physical therapy which your doctor may send you for.  Please return to the emergency department for new numbness or weakness to your arms or legs. Difficulty with urinating or urinating or pooping on yourself.  Also if you cannot feel toilet paper when you wipe or get a fever.   Take the steroids as prescribed.  Use the gel as prescribed Also take tylenol '1000mg'$ (2 extra strength) four times a day.

## 2024-03-28 NOTE — ED Notes (Signed)
Pt cannot provide urine sample at this time.  

## 2024-03-28 NOTE — ED Notes (Signed)
 Pt being transported by [facility]. Paperwork provided. Report called to SNF as documented.

## 2024-03-28 NOTE — ED Notes (Signed)
 Pt provided urinal.

## 2024-03-28 NOTE — ED Triage Notes (Signed)
 Pt arrives EMS from [facility] with right sided flank pain that has been going on for several weeks. Pt reports being seen at hospital and has been hurting since. Denies vomiting.

## 2024-03-31 NOTE — Progress Notes (Shared)
 [facility] - Clinic Note  04/06/2024   CHIEF COMPLAINT Patient presents for No chief complaint on file.  HISTORY OF PRESENT ILLNESS: Brett White is a 63 y.o. male who presents to the clinic today for:   Pt states his right eye is good, his left eye is screwed up, he states none of the drops or ointments help   Referring physician: Fleeta Zerita DASEN, MD [address] [place],  [place] [address]  HISTORICAL INFORMATION:  Selected notes from the MEDICAL RECORD NUMBER Referred by Dr. Fleeta for PDR w/ [facility] OU LEE:  Ocular Hx- PMH-   CURRENT MEDICATIONS: Current Outpatient Medications (Ophthalmic Drugs)  Medication Sig   bacitracin -polymyxin b  (POLYSPORIN ) ophthalmic ointment Place into the right eye 4 (four) times daily. Place a 1/2 inch ribbon of ointment into the lower eyelid 4 a day and as needed for discomfort   brimonidine  (ALPHAGAN ) 0.2 % ophthalmic solution Place 1 drop into the left eye in the morning and at bedtime.   carboxymethylcellulose 1 % ophthalmic solution Place 4 drops into the left eye in the morning, at noon, in the evening, and at bedtime.   erythromycin  ophthalmic ointment Place 1 Application into the left eye See admin instructions. Instill 1 ribbon in left eye every 2 hours related to injury conjunctiva and corneal abrasion without foreign body, left eye, sequela   moxifloxacin (VIGAMOX) 0.5 % ophthalmic solution Place 1 drop into the left eye in the morning and at bedtime. For 7 days   No current facility-administered medications for this visit. (Ophthalmic Drugs)   Current Outpatient Medications (Other)  Medication Sig   acetaminophen  (TYLENOL ) 325 MG tablet Take 2 tablets (650 mg total) by mouth every 6 (six) hours as needed for mild pain (or Fever >/= 101).   amLODipine  (NORVASC ) 10 MG tablet Take 10 mg by mouth every morning.   atorvastatin  (LIPITOR) 40 MG tablet Take 40 mg by mouth at bedtime.   bisacodyl  (DULCOLAX) 5 MG EC tablet  Take 10 mg by mouth daily as needed (CONSTIPATION). (Patient not taking: Reported on 03/20/2024)   cyclobenzaprine  (FLEXERIL ) 5 MG tablet Take 1 tablet (5 mg total) by mouth 3 (three) times daily as needed for muscle spasms.   diclofenac  Sodium (VOLTAREN ) 1 % GEL Apply 4 g topically 4 (four) times daily.   DULoxetine  (CYMBALTA ) 60 MG capsule Take 60 mg by mouth in the morning.   ergocalciferol  (VITAMIN D2) 1.25 MG (50000 UT) capsule Take 50,000 Units by mouth once a week. Fridays   famotidine  (PEPCID ) 20 MG tablet Take 20 mg by mouth in the morning.   FARXIGA  10 MG TABS tablet Take 10 mg by mouth every morning. Dapaglifozin   furosemide  (LASIX ) 20 MG tablet Take 20 mg by mouth every morning.   glipiZIDE  (GLUCOTROL ) 10 MG tablet Take 10 mg by mouth every morning.   guaiFENesin  (ROBITUSSIN) 100 MG/5ML liquid Take 10 mLs by mouth every 4 (four) hours as needed for cough or to loosen phlegm.   insulin  lispro (HUMALOG) 100 UNIT/ML KwikPen Inject 2-8 Units into the skin 3 (three) times daily with meals. Sliding scale 201-250=2 units 251-300=4 units 301-350=6 units 351-400= 8 units 401 or greater than=call [facility]   LINZESS  145 MCG CAPS capsule Take 145 mcg by mouth in the morning.   losartan  (COZAAR ) 100 MG tablet Take 0.5 tablets (50 mg total) by mouth in the morning.   Melatonin 5 MG CAPS Take 10 mg by mouth at bedtime.  methylPREDNISolone (MEDROL DOSEPAK) 4 MG TBPK tablet Day 1: 8mg  before breakfast, 4 mg after lunch, 4 mg after supper, and 8 mg at bedtime Day 2: 4 mg before breakfast, 4 mg after lunch, 4 mg  after supper, and 8 mg  at bedtime Day 3:  4 mg  before breakfast, 4 mg  after lunch, 4 mg after supper, and 4 mg  at bedtime Day 4: 4 mg  before breakfast, 4 mg  after lunch, and 4 mg at bedtime Day 5: 4 mg  before breakfast and 4 mg at bedtime Day 6: 4 mg  before breakfast   naloxone  (NARCAN ) nasal spray 4 mg/0.1 mL Place 1 spray into the nose See admin instructions. Every 2-3 minutesas needed  until patient response/ emba arrived.   pantoprazole  (PROTONIX ) 40 MG tablet Take 1 tablet (40 mg total) by mouth daily. (Patient taking differently: Take 40 mg by mouth in the morning.)   polyethylene glycol (MIRALAX  / GLYCOLAX ) 17 g packet Take 17 g by mouth daily. (Patient taking differently: Take 17 g by mouth in the morning. With 4 oz of fluids and drink)   pregabalin  (LYRICA ) 100 MG capsule Take 1 capsule (100 mg total) by mouth 2 (two) times daily.   sertraline  (ZOLOFT ) 50 MG tablet Take 50 mg by mouth in the morning.   tamsulosin  (FLOMAX ) 0.4 MG CAPS capsule Take 1 capsule (0.4 mg total) by mouth daily after supper.   traZODone  (DESYREL ) 150 MG tablet Take 75 mg by mouth at bedtime.   TRULICITY  1.5 MG/0.5ML SOPN Inject 0.5 mg into the skin once a week. On Thursdays   No current facility-administered medications for this visit. (Other)   REVIEW OF SYSTEMS:   ALLERGIES No Known Allergies  PAST MEDICAL HISTORY Past Medical History:  Diagnosis Date   CAP (community acquired pneumonia) 12/09/2021   CKD (chronic kidney disease)    GERD (gastroesophageal reflux disease)    HTN (hypertension)    Insulin  dependent type 2 diabetes mellitus (HCC)    Neuropathy    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   Stroke [facility])    Past Surgical History:  Procedure Laterality Date   ABDOMINAL AORTOGRAM W/LOWER EXTREMITY N/A 06/20/2023   Procedure: ABDOMINAL AORTOGRAM W/LOWER EXTREMITY;  Surgeon: Gretta Lonni PARAS, MD;  Location: [facility] INVASIVE CV LAB;  Service: Cardiovascular;  Laterality: N/A;   AMPUTATION Left 02/04/2021   Procedure: LEFT GREAT TOE AMPUTATION;  Surgeon: Harden Jerona GAILS, MD;  Location: [facility] OR;  Service: Orthopedics;  Laterality: Left;   AMPUTATION Left 02/10/2021   Procedure: LEFT FOOT 1ST RAY AMPUTATION;  Surgeon: Harden Jerona GAILS, MD;  Location: [facility] OR;  Service: Orthopedics;  Laterality: Left;   AMPUTATION Left 03/22/2021   Procedure: LEFT BELOW KNEE  AMPUTATION;  Surgeon: Harden Jerona GAILS, MD;  Location: [facility] OR;  Service: Orthopedics;  Laterality: Left;   APPENDECTOMY     BACK SURGERY     CATARACT EXTRACTION     I & D EXTREMITY Left 03/03/2021   Procedure: LEFT FOOT DEBRIDEMENT;  Surgeon: Harden Jerona GAILS, MD;  Location: [facility] OR;  Service: Orthopedics;  Laterality: Left;   INCISION AND DRAINAGE ABSCESS N/A 10/06/2021   Procedure: INCISION AND DRAINAGE BACK ABSCESS;  Surgeon: Signe Mitzie LABOR, MD;  Location: [facility] OR;  Service: General;  Laterality: N/A;   INJECTION OF SILICONE OIL Left 07/25/2023   Procedure: INJECTION OF SILICONE OIL;  Surgeon: Valdemar Rogue, MD;  Location: [facility] OR;  Service: Ophthalmology;  Laterality: Left;   MEMBRANE PEEL Left 07/25/2023   Procedure: MEMBRANE PEEL;  Surgeon: Valdemar Rogue, MD;  Location: [facility] OR;  Service: Ophthalmology;  Laterality: Left;   PARS PLANA VITRECTOMY Left 07/25/2023   Procedure: PARS PLANA VITRECTOMY WITH 25 GAUGE;  Surgeon: Valdemar Rogue, MD;  Location: [facility] OR;  Service: Ophthalmology;  Laterality: Left;   removal of back cyst     TONSILLECTOMY     FAMILY HISTORY Family History  Problem Relation Age of Onset   Diabetes Mother    Heart attack Neg Hx    SOCIAL HISTORY Social History   Tobacco Use   Smoking status: Never   Smokeless tobacco: Never  Vaping Use   Vaping status: Never Used  Substance Use Topics   Alcohol  use: Not Currently   Drug use: Not Currently       OPHTHALMIC EXAM:  Not recorded    IMAGING AND PROCEDURES  Imaging and Procedures for 04/06/2024         ASSESSMENT/PLAN: No diagnosis found.  1-5. 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 - BCVA OD 20/40 from 20/100; OS CF - stable - exam shows interval improvement in diffuse VH OD; OS stably reattached under oil - OCT shows: OD: interval improvement in IRF / IRHM temporal fovea and macula, +vitreous opacities; OS: mild interval increase in IRF / edema temporal and superior fovea and mac under silicone oil **discussed decreased efficacy / resistance to Avastin  and potential benefit of switching medication**  - recommend swtiching to IVE OU #2 today, 07.07 25 for persistent DME w/ f/u in 4 wks again - pt wishes to proceed with injections - RBA of procedure discussed, questions answered - IVE informed consent obtained and signed, 06.06.25 (OU) - see procedure note - cont Brimonidine  BID OS - post op drop and positioning instructions reviewed  - tylenol /ibuprofen  for pain  - Eylea  approved through insurance for 2025 - f/u 4 weeks, DFE, OCT  6. Traction retinal detachment, OS - focal traction RD IN quad--focal traction fibrosis along IN arcades, SRF extended IN disc - s/p PPV/MP/PFO/EL/SO OS, 10.24.2024 -- see above - retina reattached under oil - discussed possible silicon oil removal this spring or summer   7,8. Hypertensive retinopathy OU - discussed importance of tight BP control - monitor   9. Pseudophakia OU  - s/p CE/IOL OU (Dr. Fleeta)  - IOLs in good position, doing well  - monitor  Ophthalmic Meds Ordered this visit:  No orders of the defined types were placed in this encounter.    No follow-ups on file.  This document serves as a record of services personally performed by Rogue JUDITHANN Valdemar, MD, PhD. It was created on their behalf by Avelina Pereyra, COA an  ophthalmic technician. The creation of this record is the provider's dictation and/or activities during the visit.   Electronically signed by: Avelina RAMAN  Elnor, COT  03/31/24  10:42 AM    [place] JUDITHANN Hans, M.D., Ph.D. Diseases & Surgery of the Retina and Vitreous [facility]    Abbreviations: M myopia (nearsighted); A astigmatism; H hyperopia (farsighted); P presbyopia; Mrx spectacle prescription;  CTL contact lenses; OD right eye; OS left eye; OU both eyes  XT exotropia; ET esotropia; PEK punctate epithelial keratitis; PEE punctate epithelial erosions; DES dry eye syndrome; MGD meibomian gland dysfunction; ATs artificial tears; PFAT's preservative free artificial tears; NSC nuclear sclerotic cataract; PSC posterior subcapsular cataract; ERM epi-retinal membrane; PVD posterior vitreous detachment; RD retinal detachment; DM diabetes mellitus; DR diabetic retinopathy; NPDR non-proliferative diabetic retinopathy; PDR proliferative diabetic retinopathy; CSME clinically significant macular edema; DME diabetic macular edema; dbh dot blot hemorrhages; CWS cotton wool spot; POAG primary open angle glaucoma; C/D cup-to-disc ratio; HVF humphrey visual field; GVF goldmann visual field; OCT optical coherence tomography; IOP intraocular pressure; BRVO Branch retinal vein occlusion; CRVO central retinal vein occlusion; CRAO central retinal artery occlusion; BRAO branch retinal artery occlusion; RT retinal tear; SB scleral buckle; PPV pars plana vitrectomy; VH Vitreous hemorrhage; PRP panretinal laser photocoagulation; IVK intravitreal kenalog ; VMT vitreomacular traction; MH Macular hole;  NVD neovascularization of the disc; NVE neovascularization elsewhere; AREDS age related eye disease study; ARMD age related macular degeneration; POAG primary open angle glaucoma; EBMD epithelial/anterior basement membrane dystrophy; ACIOL anterior chamber intraocular lens; IOL intraocular lens; PCIOL posterior  chamber intraocular lens; Phaco/IOL phacoemulsification with intraocular lens placement; PRK photorefractive keratectomy; LASIK laser assisted in situ keratomileusis; HTN hypertension; DM diabetes mellitus; COPD chronic obstructive pulmonary disease

## 2024-04-06 ENCOUNTER — Encounter (INDEPENDENT_AMBULATORY_CARE_PROVIDER_SITE_OTHER): Admitting: Ophthalmology

## 2024-04-06 DIAGNOSIS — E119 Type 2 diabetes mellitus without complications: Secondary | ICD-10-CM

## 2024-04-06 DIAGNOSIS — H4313 Vitreous hemorrhage, bilateral: Secondary | ICD-10-CM

## 2024-04-06 DIAGNOSIS — Z961 Presence of intraocular lens: Secondary | ICD-10-CM

## 2024-04-06 DIAGNOSIS — Z794 Long term (current) use of insulin: Secondary | ICD-10-CM

## 2024-04-06 DIAGNOSIS — I10 Essential (primary) hypertension: Secondary | ICD-10-CM

## 2024-04-06 DIAGNOSIS — H3342 Traction detachment of retina, left eye: Secondary | ICD-10-CM

## 2024-04-06 DIAGNOSIS — Z7985 Long-term (current) use of injectable non-insulin antidiabetic drugs: Secondary | ICD-10-CM

## 2024-04-06 DIAGNOSIS — H35033 Hypertensive retinopathy, bilateral: Secondary | ICD-10-CM

## 2024-04-06 DIAGNOSIS — E113513 Type 2 diabetes mellitus with proliferative diabetic retinopathy with macular edema, bilateral: Secondary | ICD-10-CM

## 2024-04-11 ENCOUNTER — Encounter (HOSPITAL_COMMUNITY): Payer: Self-pay | Admitting: Family Medicine

## 2024-04-11 ENCOUNTER — Emergency Department (HOSPITAL_COMMUNITY)

## 2024-04-11 ENCOUNTER — Other Ambulatory Visit: Payer: Self-pay

## 2024-04-11 ENCOUNTER — Inpatient Hospital Stay (HOSPITAL_COMMUNITY)
Admission: EM | Admit: 2024-04-11 | Discharge: 2024-04-19 | DRG: 871 | Disposition: A | Discharge status: Other Acute Inpatient Hospital | Source: Skilled Nursing Facility | Attending: Family Medicine | Admitting: Family Medicine

## 2024-04-11 DIAGNOSIS — F5105 Insomnia due to other mental disorder: Secondary | ICD-10-CM | POA: Diagnosis present

## 2024-04-11 DIAGNOSIS — E871 Hypo-osmolality and hyponatremia: Secondary | ICD-10-CM | POA: Diagnosis present

## 2024-04-11 DIAGNOSIS — Z7985 Long-term (current) use of injectable non-insulin antidiabetic drugs: Secondary | ICD-10-CM

## 2024-04-11 DIAGNOSIS — J9811 Atelectasis: Secondary | ICD-10-CM | POA: Diagnosis present

## 2024-04-11 DIAGNOSIS — Z794 Long term (current) use of insulin: Secondary | ICD-10-CM | POA: Diagnosis not present

## 2024-04-11 DIAGNOSIS — J9601 Acute respiratory failure with hypoxia: Secondary | ICD-10-CM | POA: Diagnosis present

## 2024-04-11 DIAGNOSIS — Z833 Family history of diabetes mellitus: Secondary | ICD-10-CM

## 2024-04-11 DIAGNOSIS — A419 Sepsis, unspecified organism: Secondary | ICD-10-CM

## 2024-04-11 DIAGNOSIS — N179 Acute kidney failure, unspecified: Secondary | ICD-10-CM | POA: Diagnosis present

## 2024-04-11 DIAGNOSIS — Z1612 Extended spectrum beta lactamase (ESBL) resistance: Secondary | ICD-10-CM | POA: Diagnosis present

## 2024-04-11 DIAGNOSIS — Z1152 Encounter for screening for COVID-19: Secondary | ICD-10-CM

## 2024-04-11 DIAGNOSIS — E1122 Type 2 diabetes mellitus with diabetic chronic kidney disease: Secondary | ICD-10-CM | POA: Diagnosis present

## 2024-04-11 DIAGNOSIS — D631 Anemia in chronic kidney disease: Secondary | ICD-10-CM | POA: Diagnosis present

## 2024-04-11 DIAGNOSIS — F419 Anxiety disorder, unspecified: Secondary | ICD-10-CM | POA: Diagnosis present

## 2024-04-11 DIAGNOSIS — R6521 Severe sepsis with septic shock: Secondary | ICD-10-CM | POA: Diagnosis present

## 2024-04-11 DIAGNOSIS — I77810 Thoracic aortic ectasia: Secondary | ICD-10-CM | POA: Diagnosis present

## 2024-04-11 DIAGNOSIS — I130 Hypertensive heart and chronic kidney disease with heart failure and stage 1 through stage 4 chronic kidney disease, or unspecified chronic kidney disease: Secondary | ICD-10-CM | POA: Diagnosis present

## 2024-04-11 DIAGNOSIS — Z8673 Personal history of transient ischemic attack (TIA), and cerebral infarction without residual deficits: Secondary | ICD-10-CM

## 2024-04-11 DIAGNOSIS — Z6836 Body mass index (BMI) 36.0-36.9, adult: Secondary | ICD-10-CM | POA: Diagnosis not present

## 2024-04-11 DIAGNOSIS — A4151 Sepsis due to Escherichia coli [E. coli]: Principal | ICD-10-CM | POA: Diagnosis present

## 2024-04-11 DIAGNOSIS — F32A Depression, unspecified: Secondary | ICD-10-CM | POA: Diagnosis present

## 2024-04-11 DIAGNOSIS — K580 Irritable bowel syndrome with diarrhea: Secondary | ICD-10-CM | POA: Diagnosis present

## 2024-04-11 DIAGNOSIS — I10 Essential (primary) hypertension: Secondary | ICD-10-CM | POA: Diagnosis present

## 2024-04-11 DIAGNOSIS — I152 Hypertension secondary to endocrine disorders: Secondary | ICD-10-CM | POA: Diagnosis present

## 2024-04-11 DIAGNOSIS — E1165 Type 2 diabetes mellitus with hyperglycemia: Secondary | ICD-10-CM | POA: Diagnosis present

## 2024-04-11 DIAGNOSIS — N182 Chronic kidney disease, stage 2 (mild): Secondary | ICD-10-CM | POA: Diagnosis present

## 2024-04-11 DIAGNOSIS — Z89512 Acquired absence of left leg below knee: Secondary | ICD-10-CM

## 2024-04-11 DIAGNOSIS — G894 Chronic pain syndrome: Secondary | ICD-10-CM | POA: Diagnosis present

## 2024-04-11 DIAGNOSIS — E66812 Obesity, class 2: Secondary | ICD-10-CM | POA: Diagnosis present

## 2024-04-11 DIAGNOSIS — I5031 Acute diastolic (congestive) heart failure: Secondary | ICD-10-CM | POA: Diagnosis present

## 2024-04-11 DIAGNOSIS — Z7984 Long term (current) use of oral hypoglycemic drugs: Secondary | ICD-10-CM

## 2024-04-11 DIAGNOSIS — E785 Hyperlipidemia, unspecified: Secondary | ICD-10-CM | POA: Diagnosis present

## 2024-04-11 DIAGNOSIS — R6510 Systemic inflammatory response syndrome (SIRS) of non-infectious origin without acute organ dysfunction: Secondary | ICD-10-CM | POA: Diagnosis not present

## 2024-04-11 DIAGNOSIS — N200 Calculus of kidney: Secondary | ICD-10-CM | POA: Diagnosis present

## 2024-04-11 DIAGNOSIS — E872 Acidosis, unspecified: Secondary | ICD-10-CM | POA: Diagnosis present

## 2024-04-11 DIAGNOSIS — N12 Tubulo-interstitial nephritis, not specified as acute or chronic: Secondary | ICD-10-CM | POA: Diagnosis present

## 2024-04-11 DIAGNOSIS — N400 Enlarged prostate without lower urinary tract symptoms: Secondary | ICD-10-CM | POA: Diagnosis present

## 2024-04-11 DIAGNOSIS — Z79899 Other long term (current) drug therapy: Secondary | ICD-10-CM

## 2024-04-11 LAB — CBC WITH DIFFERENTIAL/PLATELET
Abs Immature Granulocytes: 0.22 K/uL — ABNORMAL HIGH (ref 0.00–0.07)
Basophils Absolute: 0 K/uL (ref 0.0–0.1)
Basophils Relative: 0 %
Eosinophils Absolute: 0 K/uL (ref 0.0–0.5)
Eosinophils Relative: 0 %
HCT: 36.5 % — ABNORMAL LOW (ref 39.0–52.0)
Hemoglobin: 11.1 g/dL — ABNORMAL LOW (ref 13.0–17.0)
Immature Granulocytes: 1 %
Lymphocytes Relative: 3 %
Lymphs Abs: 0.6 K/uL — ABNORMAL LOW (ref 0.7–4.0)
MCH: 24.6 pg — ABNORMAL LOW (ref 26.0–34.0)
MCHC: 30.4 g/dL (ref 30.0–36.0)
MCV: 80.9 fL (ref 80.0–100.0)
Monocytes Absolute: 1.7 K/uL — ABNORMAL HIGH (ref 0.1–1.0)
Monocytes Relative: 7 %
Neutro Abs: 20.9 K/uL — ABNORMAL HIGH (ref 1.7–7.7)
Neutrophils Relative %: 89 %
Platelets: 205 K/uL (ref 150–400)
RBC: 4.51 MIL/uL (ref 4.22–5.81)
RDW: 18.1 % — ABNORMAL HIGH (ref 11.5–15.5)
WBC: 23.4 K/uL — ABNORMAL HIGH (ref 4.0–10.5)
nRBC: 0 % (ref 0.0–0.2)

## 2024-04-11 LAB — URINALYSIS, W/ REFLEX TO CULTURE (INFECTION SUSPECTED)
Bilirubin Urine: NEGATIVE
Glucose, UA: >=500 mg/dL — AB
Ketones, ur: 5 mg/dL — AB
Nitrite: NEGATIVE
Protein, ur: 100 mg/dL — AB
Specific Gravity, Urine: 1.017 (ref 1.005–1.030)
WBC, UA: >50 WBC/hpf (ref 0–5)
pH: 5 (ref 5.0–8.0)

## 2024-04-11 LAB — COMPREHENSIVE METABOLIC PANEL WITH GFR
ALT: 20 U/L (ref 0–44)
AST: 31 U/L (ref 15–41)
Albumin: 3 g/dL — ABNORMAL LOW (ref 3.5–5.0)
Alkaline Phosphatase: 90 U/L (ref 38–126)
Anion gap: 17 — ABNORMAL HIGH (ref 5–15)
BUN: 22 mg/dL — ABNORMAL HIGH (ref 6–20)
CO2: 20 mmol/L — ABNORMAL LOW (ref 22–32)
Calcium: 8.6 mg/dL — ABNORMAL LOW (ref 8.9–10.3)
Chloride: 97 mmol/L — ABNORMAL LOW (ref 98–111)
Creatinine, Ser: 2.7 mg/dL — ABNORMAL HIGH (ref 0.61–1.24)
GFR, Estimated: 26 mL/min — ABNORMAL LOW (ref >60)
Glucose, Bld: 277 mg/dL — ABNORMAL HIGH (ref 70–99)
Potassium: 4.7 mmol/L (ref 3.5–5.1)
Sodium: 134 mmol/L — ABNORMAL LOW (ref 135–145)
Total Bilirubin: 0.9 mg/dL (ref 0.0–1.2)
Total Protein: 7 g/dL (ref 6.5–8.1)

## 2024-04-11 LAB — RESP PANEL BY RT-PCR (RSV, FLU A&B, COVID)  RVPGX2
Influenza A by PCR: NEGATIVE
Influenza B by PCR: NEGATIVE
Resp Syncytial Virus by PCR: NEGATIVE
SARS Coronavirus 2 by RT PCR: NEGATIVE

## 2024-04-11 LAB — I-STAT CG4 LACTIC ACID, ED
Lactic Acid, Venous: 5.4 mmol/L (ref 0.5–1.9)
Lactic Acid, Venous: 1.9 mmol/L (ref 0.5–1.9)

## 2024-04-11 LAB — PROTIME-INR
INR: 1.2 (ref 0.8–1.2)
Prothrombin Time: 15.6 s — ABNORMAL HIGH (ref 11.4–15.2)

## 2024-04-11 LAB — CBG MONITORING, ED
Glucose-Capillary: 285 mg/dL — ABNORMAL HIGH (ref 70–99)
Glucose-Capillary: 224 mg/dL — ABNORMAL HIGH (ref 70–99)

## 2024-04-11 MED ORDER — ACETAMINOPHEN 325 MG PO TABS
650.0000 mg | ORAL_TABLET | Freq: Four times a day (QID) | ORAL | Status: DC | PRN
  Administered 2024-04-12 – 2024-04-16 (×6): 650 mg via ORAL
  Filled 2024-04-11 (×6): qty 2

## 2024-04-11 MED ORDER — LACTATED RINGERS IV SOLN: INTRAVENOUS | Status: DC

## 2024-04-11 MED ORDER — ACETAMINOPHEN 650 MG RE SUPP: 650.0000 mg | Freq: Four times a day (QID) | RECTAL | Status: DC | PRN

## 2024-04-11 MED ORDER — VANCOMYCIN HCL 2000 MG/400ML IV SOLN
2000.0000 mg | Freq: Once | INTRAVENOUS | Status: AC
  Administered 2024-04-11: 2000 mg via INTRAVENOUS
  Filled 2024-04-11: qty 400

## 2024-04-11 MED ORDER — ONDANSETRON HCL 4 MG PO TABS: 4.0000 mg | ORAL_TABLET | Freq: Four times a day (QID) | ORAL | Status: DC | PRN

## 2024-04-11 MED ORDER — ONDANSETRON HCL 4 MG/2ML IJ SOLN
4.0000 mg | Freq: Once | INTRAMUSCULAR | Status: AC
  Administered 2024-04-11: 4 mg via INTRAVENOUS
  Filled 2024-04-11: qty 2

## 2024-04-11 MED ORDER — BRIMONIDINE TARTRATE 0.2 % OP SOLN
1.0000 [drp] | Freq: Two times a day (BID) | OPHTHALMIC | Status: DC
  Administered 2024-04-12 – 2024-04-19 (×15): 1 [drp] via OPHTHALMIC
  Filled 2024-04-11 (×2): qty 5

## 2024-04-11 MED ORDER — OXYCODONE HCL 5 MG PO TABS
5.0000 mg | ORAL_TABLET | ORAL | Status: DC | PRN
  Administered 2024-04-12 (×3): 5 mg via ORAL
  Filled 2024-04-11 (×3): qty 1

## 2024-04-11 MED ORDER — INSULIN ASPART 100 UNIT/ML IJ SOLN
0.0000 [IU] | Freq: Every day | INTRAMUSCULAR | Status: DC
  Administered 2024-04-11: 2 [IU] via SUBCUTANEOUS
  Administered 2024-04-12 – 2024-04-14 (×3): 3 [IU] via SUBCUTANEOUS
  Administered 2024-04-15 – 2024-04-17 (×3): 2 [IU] via SUBCUTANEOUS
  Administered 2024-04-18: 3 [IU] via SUBCUTANEOUS
  Filled 2024-04-11: qty 0.05

## 2024-04-11 MED ORDER — SODIUM CHLORIDE 0.9 % IV SOLN
1.0000 g | Freq: Once | INTRAVENOUS | Status: AC
  Administered 2024-04-11: 1 g via INTRAVENOUS
  Filled 2024-04-11: qty 20

## 2024-04-11 MED ORDER — HEPARIN SODIUM (PORCINE) 5000 UNIT/ML IJ SOLN
5000.0000 [IU] | Freq: Three times a day (TID) | INTRAMUSCULAR | Status: DC
  Administered 2024-04-11 – 2024-04-19 (×23): 5000 [IU] via SUBCUTANEOUS
  Filled 2024-04-11 (×22): qty 1

## 2024-04-11 MED ORDER — SODIUM CHLORIDE 0.9% FLUSH
3.0000 mL | Freq: Two times a day (BID) | INTRAVENOUS | Status: DC
  Administered 2024-04-11 – 2024-04-19 (×15): 3 mL via INTRAVENOUS

## 2024-04-11 MED ORDER — ONDANSETRON HCL 4 MG/2ML IJ SOLN
4.0000 mg | Freq: Four times a day (QID) | INTRAMUSCULAR | Status: DC | PRN
  Administered 2024-04-13: 4 mg via INTRAVENOUS
  Filled 2024-04-11: qty 2

## 2024-04-11 MED ORDER — SODIUM CHLORIDE 0.9 % IV SOLN
1.0000 g | Freq: Two times a day (BID) | INTRAVENOUS | Status: DC
  Administered 2024-04-12 – 2024-04-13 (×3): 1 g via INTRAVENOUS
  Filled 2024-04-11 (×4): qty 20

## 2024-04-11 MED ORDER — SERTRALINE HCL 50 MG PO TABS
50.0000 mg | ORAL_TABLET | Freq: Every day | ORAL | Status: DC
  Administered 2024-04-12 – 2024-04-19 (×8): 50 mg via ORAL
  Filled 2024-04-11 (×8): qty 1

## 2024-04-11 MED ORDER — ATORVASTATIN CALCIUM 40 MG PO TABS
40.0000 mg | ORAL_TABLET | Freq: Every day | ORAL | Status: DC
  Administered 2024-04-11 – 2024-04-18 (×8): 40 mg via ORAL
  Filled 2024-04-11 (×9): qty 1

## 2024-04-11 MED ORDER — LACTATED RINGERS IV BOLUS (SEPSIS)
250.0000 mL | Freq: Once | INTRAVENOUS | Status: AC
  Administered 2024-04-11: 250 mL via INTRAVENOUS

## 2024-04-11 MED ORDER — SODIUM CHLORIDE 0.9 % IV SOLN
2.0000 g | Freq: Once | INTRAVENOUS | Status: AC
  Administered 2024-04-11: 2 g via INTRAVENOUS
  Filled 2024-04-11: qty 20

## 2024-04-11 MED ORDER — MORPHINE SULFATE (PF) 2 MG/ML IV SOLN
2.0000 mg | Freq: Once | INTRAVENOUS | Status: AC
  Administered 2024-04-11: 2 mg via INTRAVENOUS
  Filled 2024-04-11: qty 1

## 2024-04-11 MED ORDER — SENNA 8.6 MG PO TABS
1.0000 | ORAL_TABLET | Freq: Every day | ORAL | Status: DC | PRN
  Administered 2024-04-16: 8.6 mg via ORAL
  Filled 2024-04-11: qty 1

## 2024-04-11 MED ORDER — VANCOMYCIN HCL 1500 MG/300ML IV SOLN: 1500.0000 mg | INTRAVENOUS | Status: DC

## 2024-04-11 MED ORDER — IPRATROPIUM-ALBUTEROL 0.5-2.5 (3) MG/3ML IN SOLN
3.0000 mL | Freq: Once | RESPIRATORY_TRACT | Status: AC
  Administered 2024-04-11: 3 mL via RESPIRATORY_TRACT
  Filled 2024-04-11: qty 3

## 2024-04-11 MED ORDER — VANCOMYCIN HCL IN DEXTROSE 1-5 GM/200ML-% IV SOLN: 1000.0000 mg | Freq: Once | INTRAVENOUS | Status: DC

## 2024-04-11 MED ORDER — INSULIN ASPART 100 UNIT/ML IJ SOLN
0.0000 [IU] | Freq: Three times a day (TID) | INTRAMUSCULAR | Status: DC
  Administered 2024-04-12: 3 [IU] via SUBCUTANEOUS
  Administered 2024-04-12 – 2024-04-13 (×4): 2 [IU] via SUBCUTANEOUS
  Administered 2024-04-13: 3 [IU] via SUBCUTANEOUS
  Administered 2024-04-14 (×2): 2 [IU] via SUBCUTANEOUS
  Administered 2024-04-14 – 2024-04-15 (×2): 3 [IU] via SUBCUTANEOUS
  Administered 2024-04-15 – 2024-04-16 (×3): 2 [IU] via SUBCUTANEOUS
  Administered 2024-04-16: 3 [IU] via SUBCUTANEOUS
  Administered 2024-04-16: 2 [IU] via SUBCUTANEOUS
  Administered 2024-04-17: 1 [IU] via SUBCUTANEOUS
  Administered 2024-04-17 – 2024-04-18 (×4): 3 [IU] via SUBCUTANEOUS
  Administered 2024-04-18 – 2024-04-19 (×2): 2 [IU] via SUBCUTANEOUS
  Administered 2024-04-19: 5 [IU] via SUBCUTANEOUS
  Filled 2024-04-11: qty 0.06

## 2024-04-11 MED ORDER — LACTATED RINGERS IV BOLUS (SEPSIS)
1000.0000 mL | Freq: Once | INTRAVENOUS | Status: AC
  Administered 2024-04-11: 1000 mL via INTRAVENOUS

## 2024-04-11 MED ORDER — HYDROMORPHONE HCL 1 MG/ML IJ SOLN
0.5000 mg | INTRAMUSCULAR | Status: DC | PRN
  Administered 2024-04-11 – 2024-04-14 (×11): 0.5 mg via INTRAVENOUS
  Filled 2024-04-11 (×12): qty 1

## 2024-04-11 MED ORDER — TRAZODONE HCL 50 MG PO TABS
75.0000 mg | ORAL_TABLET | Freq: Every day | ORAL | Status: DC
  Administered 2024-04-11 – 2024-04-18 (×8): 75 mg via ORAL
  Filled 2024-04-11 (×8): qty 2

## 2024-04-11 MED ORDER — ERYTHROMYCIN 5 MG/GM OP OINT
1.0000 | TOPICAL_OINTMENT | OPHTHALMIC | Status: DC
  Administered 2024-04-11 – 2024-04-16 (×54): 1 via OPHTHALMIC
  Filled 2024-04-11: qty 1
  Filled 2024-04-11: qty 3.5

## 2024-04-11 MED ORDER — LACTATED RINGERS IV BOLUS
1000.0000 mL | Freq: Once | INTRAVENOUS | Status: AC
  Administered 2024-04-11: 1000 mL via INTRAVENOUS

## 2024-04-11 MED ORDER — SODIUM CHLORIDE 0.9 % IV SOLN
500.0000 mg | Freq: Once | INTRAVENOUS | Status: AC
  Administered 2024-04-11: 500 mg via INTRAVENOUS
  Filled 2024-04-11: qty 5

## 2024-04-11 NOTE — ED Notes (Addendum)
 Was advised patients oxygen was 80. Walked into the room and patient had taken off NRB mask. I instructed patient he needed to keep that on because his oxygen was low and needed the oxygen to help support him.

## 2024-04-11 NOTE — ED Notes (Signed)
 Lactic was drawn later due to fluid boluses not going in from gravity.

## 2024-04-11 NOTE — Progress Notes (Signed)
 Pharmacy Antibiotic Note  Brett White is a 63 y.o. male admitted on 04/11/2024 with  Fatigue, malaise, N/V, right flank pain .  Pharmacy has been consulted for vancomycin  dosing.  Plan: Vancomycin  2gm IV x 1 then 1500mg  q36h (AUC 512.3, Scr 2.7) Follow renal function, cultures and clinical course  Height: 6' 2 (188 cm) Weight: 120.2 kg (265 lb) IBW/kg (Calculated) : 82.2  Temp (24hrs), Avg:98.5 F (36.9 C), Min:98.3 F (36.8 C), Max:98.6 F (37 C)  Recent Labs  Lab 04/11/24 1740 04/11/24 1750 04/11/24 2056  WBC 23.4*  --   --   CREATININE 2.70*  --   --   LATICACIDVEN  --  5.4* 1.9    Estimated Creatinine Clearance: 40.1 mL/min (A) (by C-G formula based on SCr of 2.7 mg/dL (H)).    No Known Allergies  Antimicrobials this admission: 7/12 vanc >> 7/12 merrem  >>  Dose adjustments this admission:   Microbiology results: 7/12 BCx:  7/12 UCx:   Thank you for allowing pharmacy to be a part of this patient's care.  Leeroy Mace RPh 04/11/2024, 10:43 PM

## 2024-04-11 NOTE — ED Notes (Signed)
 Patient currently trying to pee at this time.

## 2024-04-11 NOTE — ED Triage Notes (Signed)
 Also states having abdominal pain. Suspects kidney stone again. Give 4mg  of zofran  by ems

## 2024-04-11 NOTE — Progress Notes (Signed)
eLINK following for sepsis protocol. ?

## 2024-04-11 NOTE — ED Notes (Signed)
 This NA notified MD of critical value.

## 2024-04-11 NOTE — ED Notes (Signed)
 Patient taken to CT scan.

## 2024-04-11 NOTE — ED Provider Notes (Signed)
 [place] EMERGENCY DEPARTMENT AT [facility] Provider Note   CSN: [id number] Arrival date & time: 04/11/24  1717     Patient presents with: Nausea, Emesis, Diarrhea, Fall, and Shortness of Breath   Brett White is a 63 y.o. male.   63 year old male with past medical history of hypertension hyperlipidemia presenting to the emergency department from his rehab facility with cough, shortness of breath as well as some increased urinary frequency and urgency.  The patient is been feeling unwell over the past few days.  Was called this evening for some respiratory distress.  Medics note the patient was hypoxic initially.  Was placed on a nonrebreather.  He has diminished breath sounds in his lower lung fields.  Has been coughing intermittently now over the past few days.  Denies any hemoptysis.  He was diaphoretic initially.  He does report some increased urinary frequency and urgency recently.  He was brought to the ER at that time for further evaluation.   Emesis Associated symptoms: diarrhea   Diarrhea Associated symptoms: vomiting   Fall Associated symptoms include shortness of breath.  Shortness of Breath Associated symptoms: vomiting        Prior to Admission medications   Medication Sig Start Date End Date Taking? Authorizing Provider  amLODipine  (NORVASC ) 10 MG tablet Take 10 mg by mouth every morning. 05/11/22  Yes [provider]  brimonidine  (ALPHAGAN ) 0.2 % ophthalmic solution Place 1 drop into the left eye in the morning and at bedtime. 08/29/23  Yes [provider]  carboxymethylcellulose 1 % ophthalmic solution Place 4 drops into the left eye in the morning, at noon, in the evening, and at bedtime.   Yes [provider]  DULoxetine  (CYMBALTA ) 60 MG capsule Take 60 mg by mouth in the morning. 08/28/23  Yes [provider]  ergocalciferol  (VITAMIN D2) 1.25 MG (50000 UT) capsule Take 50,000 Units by mouth once a week. Fridays    Yes [provider]  famotidine  (PEPCID ) 20 MG tablet Take 20 mg by mouth in the morning. 04/20/23  Yes [provider]  FARXIGA  10 MG TABS tablet Take 10 mg by mouth every morning. Dapaglifozin 02/07/22  Yes [provider]  furosemide  (LASIX ) 20 MG tablet Take 20 mg by mouth every morning. 05/11/22  Yes [provider]  glipiZIDE  (GLUCOTROL ) 10 MG tablet Take 10 mg by mouth every morning. 05/11/22  Yes [provider]  LINZESS  145 MCG CAPS capsule Take 145 mcg by mouth in the morning. 06/11/23  Yes [provider]  losartan  (COZAAR ) 100 MG tablet Take 0.5 tablets (50 mg total) by mouth in the morning. 03/24/24  Yes Ezenduka, Nkeiruka J, MD  Melatonin 5 MG CAPS Take 10 mg by mouth at bedtime.   Yes [provider]  pantoprazole  (PROTONIX ) 40 MG tablet Take 1 tablet (40 mg total) by mouth daily. Patient taking differently: Take 40 mg by mouth in the morning. 10/20/21  Yes Ghimire, Donalda HERO, MD  polyethylene glycol (MIRALAX  / GLYCOLAX ) 17 g packet Take 17 g by mouth daily. Patient not taking: Reported on 04/20/2024 11/18/22  Yes Krishnan, Gokul, MD  sertraline  (ZOLOFT ) 50 MG tablet Take 50 mg by mouth in the morning. 04/30/23  Yes [provider]  tamsulosin  (FLOMAX ) 0.4 MG CAPS capsule Take 1 capsule (0.4 mg total) by mouth daily after supper. 03/24/24 04/23/24 Yes Ezenduka, Nkeiruka J, MD  traZODone  (DESYREL ) 150 MG tablet Take 75 mg by mouth at bedtime. 05/30/23  Yes  [provider]  TRULICITY  1.5 MG/0.5ML SOPN Inject 0.5 mg into the skin once a week. On Thursdays 04/04/23  Yes [provider]  atorvastatin  (LIPITOR) 40 MG tablet Take 40 mg by mouth at bedtime. 11/07/21   [provider]  feeding supplement (ENSURE PLUS HIGH PROTEIN) LIQD Take 237 mLs by mouth 2 (two) times daily between meals. 04/19/24   Briana Elgin LABOR, MD  HYDROmorphone  (DILAUDID ) 4 MG tablet Take 1 tablet (4 mg total) by mouth every 6 (six)  hours as needed for moderate pain (pain score 4-6) or severe pain (pain score 7-10). Patient not taking: Reported on 04/20/2024 04/19/24   Briana Elgin LABOR, MD  pregabalin  (LYRICA ) 100 MG capsule Take 1 capsule (100 mg total) by mouth 2 (two) times daily. 04/19/24   Briana Elgin LABOR, MD    Allergies: Patient has no known allergies.    Review of Systems  Respiratory:  Positive for shortness of breath.   Gastrointestinal:  Positive for diarrhea and vomiting.  All other systems reviewed and are negative.   Updated Vital Signs BP (!) 174/85 (BP Location: Left Arm)   Pulse 95   Temp 98.4 F (36.9 C)   Resp 16   Ht 6' 2 (1.88 m)   Wt 127.2 kg   SpO2 96%   BMI 36.00 kg/m   Physical Exam Vitals and nursing note reviewed.   Gen: Mild conversational dyspnea noted Eyes: PERRL, EOMI HEENT: no oropharyngeal swelling Neck: trachea midline Resp: coarse breath sounds left lung base Card: RRR, no murmurs, rubs, or gallops Abd: nontender, nondistended Extremities: no calf tenderness, no edema Vascular: 2+ radial pulses bilaterally, 2+ DP pulses bilaterally Skin: no rashes Psyc: acting appropriately   (all labs ordered are listed, but only abnormal results are displayed) Labs Reviewed  URINE CULTURE - Abnormal; Notable for the following components:      Result Value   Culture   (*)    Value: 80,000 COLONIES/mL ESCHERICHIA COLI Confirmed Extended Spectrum Beta-Lactamase Producer (ESBL).  In bloodstream infections from ESBL organisms, carbapenems are preferred over piperacillin /tazobactam. They are shown to have a lower risk of mortality.    Organism ID, Bacteria ESCHERICHIA COLI (*)    All other components within normal limits  MRSA NEXT GEN BY PCR, NASAL - Abnormal; Notable for the following components:   MRSA by PCR Next Gen DETECTED (*)    All other components within normal limits  COMPREHENSIVE METABOLIC PANEL WITH GFR - Abnormal; Notable for the following components:   Sodium 134  (*)    Chloride 97 (*)    CO2 20 (*)    Glucose, Bld 277 (*)    BUN 22 (*)    Creatinine, Ser 2.70 (*)    Calcium  8.6 (*)    Albumin  3.0 (*)    GFR, Estimated 26 (*)    Anion gap 17 (*)    All other components within normal limits  CBC WITH DIFFERENTIAL/PLATELET - Abnormal; Notable for the following components:   WBC 23.4 (*)    Hemoglobin 11.1 (*)    HCT 36.5 (*)    MCH 24.6 (*)    RDW 18.1 (*)    Neutro Abs 20.9 (*)    Lymphs Abs 0.6 (*)    Monocytes Absolute 1.7 (*)    Abs Immature Granulocytes 0.22 (*)    All other components within normal limits  PROTIME-INR - Abnormal; Notable for the following components:   Prothrombin Time 15.6 (*)    All  other components within normal limits  URINALYSIS, W/ REFLEX TO CULTURE (INFECTION SUSPECTED) - Abnormal; Notable for the following components:   APPearance CLOUDY (*)    Glucose, UA >=500 (*)    Hgb urine dipstick MODERATE (*)    Ketones, ur 5 (*)    Protein, ur 100 (*)    Leukocytes,Ua SMALL (*)    Bacteria, UA MANY (*)    All other components within normal limits  BASIC METABOLIC PANEL WITH GFR - Abnormal; Notable for the following components:   Glucose, Bld 189 (*)    BUN 24 (*)    Creatinine, Ser 2.20 (*)    Calcium  8.2 (*)    GFR, Estimated 33 (*)    All other components within normal limits  CBC - Abnormal; Notable for the following components:   WBC 16.9 (*)    Hemoglobin 10.4 (*)    HCT 36.7 (*)    MCH 24.6 (*)    MCHC 28.3 (*)    RDW 18.6 (*)    All other components within normal limits  GLUCOSE, CAPILLARY - Abnormal; Notable for the following components:   Glucose-Capillary 228 (*)    All other components within normal limits  GLUCOSE, CAPILLARY - Abnormal; Notable for the following components:   Glucose-Capillary 276 (*)    All other components within normal limits  GLUCOSE, CAPILLARY - Abnormal; Notable for the following components:   Glucose-Capillary 213 (*)    All other components within normal limits   BASIC METABOLIC PANEL WITH GFR - Abnormal; Notable for the following components:   Sodium 132 (*)    Glucose, Bld 221 (*)    BUN 25 (*)    Creatinine, Ser 1.99 (*)    Calcium  8.0 (*)    GFR, Estimated 38 (*)    All other components within normal limits  CBC - Abnormal; Notable for the following components:   WBC 11.1 (*)    RBC 3.93 (*)    Hemoglobin 9.5 (*)    HCT 31.7 (*)    MCH 24.2 (*)    RDW 18.4 (*)    All other components within normal limits  C-REACTIVE PROTEIN - Abnormal; Notable for the following components:   CRP 25.6 (*)    All other components within normal limits  GLUCOSE, CAPILLARY - Abnormal; Notable for the following components:   Glucose-Capillary 257 (*)    All other components within normal limits  GLUCOSE, CAPILLARY - Abnormal; Notable for the following components:   Glucose-Capillary 211 (*)    All other components within normal limits  GLUCOSE, CAPILLARY - Abnormal; Notable for the following components:   Glucose-Capillary 257 (*)    All other components within normal limits  GLUCOSE, CAPILLARY - Abnormal; Notable for the following components:   Glucose-Capillary 236 (*)    All other components within normal limits  BASIC METABOLIC PANEL WITH GFR - Abnormal; Notable for the following components:   Sodium 133 (*)    Glucose, Bld 223 (*)    Creatinine, Ser 1.28 (*)    Calcium  7.8 (*)    Anion gap 4 (*)    All other components within normal limits  CBC - Abnormal; Notable for the following components:   RBC 3.68 (*)    Hemoglobin 8.9 (*)    HCT 29.7 (*)    MCH 24.2 (*)    RDW 18.4 (*)    All other components within normal limits  GLUCOSE, CAPILLARY - Abnormal; Notable for the following components:  Glucose-Capillary 258 (*)    All other components within normal limits  GLUCOSE, CAPILLARY - Abnormal; Notable for the following components:   Glucose-Capillary 202 (*)    All other components within normal limits  GLUCOSE, CAPILLARY - Abnormal;  Notable for the following components:   Glucose-Capillary 284 (*)    All other components within normal limits  GLUCOSE, CAPILLARY - Abnormal; Notable for the following components:   Glucose-Capillary 247 (*)    All other components within normal limits  CBC WITH DIFFERENTIAL/PLATELET - Abnormal; Notable for the following components:   RBC 3.37 (*)    Hemoglobin 8.2 (*)    HCT 27.2 (*)    MCH 24.3 (*)    RDW 18.6 (*)    All other components within normal limits  COMPREHENSIVE METABOLIC PANEL WITH GFR - Abnormal; Notable for the following components:   Sodium 132 (*)    Glucose, Bld 213 (*)    BUN 23 (*)    Creatinine, Ser 1.42 (*)    Calcium  8.0 (*)    Total Protein 5.9 (*)    Albumin  2.3 (*)    GFR, Estimated 57 (*)    All other components within normal limits  GLUCOSE, CAPILLARY - Abnormal; Notable for the following components:   Glucose-Capillary 279 (*)    All other components within normal limits  GLUCOSE, CAPILLARY - Abnormal; Notable for the following components:   Glucose-Capillary 198 (*)    All other components within normal limits  BRAIN NATRIURETIC PEPTIDE - Abnormal; Notable for the following components:   B Natriuretic Peptide 163.4 (*)    All other components within normal limits  GLUCOSE, CAPILLARY - Abnormal; Notable for the following components:   Glucose-Capillary 260 (*)    All other components within normal limits  GLUCOSE, CAPILLARY - Abnormal; Notable for the following components:   Glucose-Capillary 248 (*)    All other components within normal limits  GLUCOSE, CAPILLARY - Abnormal; Notable for the following components:   Glucose-Capillary 247 (*)    All other components within normal limits  GLUCOSE, CAPILLARY - Abnormal; Notable for the following components:   Glucose-Capillary 214 (*)    All other components within normal limits  BASIC METABOLIC PANEL WITH GFR - Abnormal; Notable for the following components:   Glucose, Bld 276 (*)    BUN 30  (*)    Creatinine, Ser 1.48 (*)    Calcium  8.5 (*)    GFR, Estimated 54 (*)    All other components within normal limits  GLUCOSE, CAPILLARY - Abnormal; Notable for the following components:   Glucose-Capillary 241 (*)    All other components within normal limits  GLUCOSE, CAPILLARY - Abnormal; Notable for the following components:   Glucose-Capillary 237 (*)    All other components within normal limits  GLUCOSE, CAPILLARY - Abnormal; Notable for the following components:   Glucose-Capillary 267 (*)    All other components within normal limits  CBC - Abnormal; Notable for the following components:   RBC 3.61 (*)    Hemoglobin 8.8 (*)    HCT 29.5 (*)    MCH 24.4 (*)    MCHC 29.8 (*)    RDW 18.6 (*)    All other components within normal limits  GLUCOSE, CAPILLARY - Abnormal; Notable for the following components:   Glucose-Capillary 213 (*)    All other components within normal limits  GLUCOSE, CAPILLARY - Abnormal; Notable for the following components:   Glucose-Capillary 193 (*)  All other components within normal limits  BASIC METABOLIC PANEL WITH GFR - Abnormal; Notable for the following components:   Chloride 97 (*)    Glucose, Bld 213 (*)    BUN 33 (*)    Calcium  8.6 (*)    All other components within normal limits  GLUCOSE, CAPILLARY - Abnormal; Notable for the following components:   Glucose-Capillary 280 (*)    All other components within normal limits  GLUCOSE, CAPILLARY - Abnormal; Notable for the following components:   Glucose-Capillary 272 (*)    All other components within normal limits  GLUCOSE, CAPILLARY - Abnormal; Notable for the following components:   Glucose-Capillary 223 (*)    All other components within normal limits  GLUCOSE, CAPILLARY - Abnormal; Notable for the following components:   Glucose-Capillary 216 (*)    All other components within normal limits  BASIC METABOLIC PANEL WITH GFR - Abnormal; Notable for the following components:    Glucose, Bld 196 (*)    BUN 27 (*)    Calcium  8.6 (*)    All other components within normal limits  GLUCOSE, CAPILLARY - Abnormal; Notable for the following components:   Glucose-Capillary 274 (*)    All other components within normal limits  GLUCOSE, CAPILLARY - Abnormal; Notable for the following components:   Glucose-Capillary 294 (*)    All other components within normal limits  GLUCOSE, CAPILLARY - Abnormal; Notable for the following components:   Glucose-Capillary 276 (*)    All other components within normal limits  GLUCOSE, CAPILLARY - Abnormal; Notable for the following components:   Glucose-Capillary 243 (*)    All other components within normal limits  GLUCOSE, CAPILLARY - Abnormal; Notable for the following components:   Glucose-Capillary 368 (*)    All other components within normal limits  I-STAT CG4 LACTIC ACID, ED - Abnormal; Notable for the following components:   Lactic Acid, Venous 5.4 (*)    All other components within normal limits  CBG MONITORING, ED - Abnormal; Notable for the following components:   Glucose-Capillary 285 (*)    All other components within normal limits  CBG MONITORING, ED - Abnormal; Notable for the following components:   Glucose-Capillary 224 (*)    All other components within normal limits  RESP PANEL BY RT-PCR (RSV, FLU A&B, COVID)  [id number]  CULTURE, BLOOD (ROUTINE X 2)  CULTURE, BLOOD (ROUTINE X 2)  MAGNESIUM   MAGNESIUM   MAGNESIUM   MAGNESIUM   MAGNESIUM   MAGNESIUM   MAGNESIUM   I-STAT CG4 LACTIC ACID, ED  TROPONIN I (HIGH SENSITIVITY)  TROPONIN I (HIGH SENSITIVITY)    EKG: EKG Interpretation Date/Time:  Saturday April 11 2024 17:33:31 EDT Ventricular Rate:  116 PR Interval:  152 QRS Duration:  97 QT Interval:  308 QTC Calculation: 428 R Axis:   -24  Text Interpretation: Sinus tachycardia Borderline left axis deviation Confirmed by Ula Barter [phone]) on 04/11/2024 5:55:46 PM  Radiology: CT CHEST WO CONTRAST Result Date:  04/20/2024 EXAM: CT CHEST WITHOUT CONTRAST 04/20/2024 03:21:24 PM TECHNIQUE: CT of the chest was performed without the administration of intravenous contrast. Multiplanar reformatted images are provided for review. Automated exposure control, iterative reconstruction, and/or weight based adjustment of the mA/kV was utilized to reduce the radiation dose to as low as reasonably achievable. COMPARISON: Chest radiograph of earlier today. CLINICAL HISTORY: Dyspnea, chronic, chest wall or pleura disease suspected. Pt BIB EMS from [facility] for chest pain, states its more like pressure on his chest, and SOB for several days. Facility said room  air 78%. Given 3 nitroglycerin  at the facility. EMS gave 1 nitroglycerin , 324 ASA, 5 albulterol. Not normally on oxygen. Hx of pneumonia. Aox4. FINDINGS: MEDIASTINUM: Mild cardiomegaly. Pulmonary artery enlargement, outflow tract 3.2 cm. Moderate esophageal dilatation with food or debris within, including on 75/3. LYMPH NODES: No thoracic adenopathy. LUNGS AND PLEURA: Minimal dependent subsegmental atelectasis in both lung bases. Tiny bilateral pleural effusions. SOFT TISSUES/BONES: Aortic atherosclerosis. LAD coronary artery calcification. UPPER ABDOMEN: Limited images of the upper abdomen demonstrates no acute abnormality. IMPRESSION: 1. No acute findings. 2. Tiny bilateral pleural effusions 3. Aortic and coronary artery atherosclerosis 4.  Pulmonary artery enlargement suggests pulmonary arterial hypertension. 5. Dilated esophagus with food/debris within. This suggests esophageal dysmotility and/or gastroesophageal reflux. Electronically signed by: Rockey Kilts MD 04/20/2024 03:35 PM EDT RP Workstation: [id number]   US  Abdomen Limited RUQ (LIVER/GB) Result Date: 04/20/2024 CLINICAL DATA:  Upper abdominal pain EXAM: ULTRASOUND ABDOMEN LIMITED RIGHT UPPER QUADRANT COMPARISON:  None Available. FINDINGS: Gallbladder: No gallstones or wall thickening visualized. No sonographic Murphy  sign noted by sonographer. Common bile duct: Diameter: 0.3 cm Liver: No focal lesion identified. Within normal limits in parenchymal echogenicity. Portal vein is patent on color Doppler imaging with normal direction of blood flow towards the liver. Other: None. IMPRESSION: 1. Normal hepatobiliary ultrasound. Electronically Signed   By: Ryan Salvage M.D.   On: 04/20/2024 14:42   DG Chest Portable 1 View Result Date: 04/20/2024 CLINICAL DATA:  Chest pain and shortness of breath EXAM: PORTABLE CHEST 1 VIEW COMPARISON:  Chest radiograph dated 04/15/2024 FINDINGS: Low lung volumes with bronchovascular crowding. No focal consolidations. No pleural effusion or pneumothorax. The heart size and mediastinal contours are within normal limits. No acute osseous abnormality. IMPRESSION: Low lung volumes with bronchovascular crowding. No focal consolidations. Electronically Signed   By: Limin  Xu M.D.   On: 04/20/2024 09:41     Procedures   Medications Ordered in the ED  mupirocin  ointment (BACTROBAN ) 2 % 1 Application (1 Application Nasal Not Given 04/16/24 2148)  lactated ringers  bolus 1,000 mL (0 mLs Intravenous Stopped 04/11/24 2038)    And  lactated ringers  bolus 1,000 mL (0 mLs Intravenous Stopped 04/11/24 2038)    And  lactated ringers  bolus 250 mL (0 mLs Intravenous Stopped 04/11/24 2053)  cefTRIAXone  (ROCEPHIN ) 2 g in sodium chloride  0.9 % 100 mL IVPB (0 g Intravenous Stopped 04/11/24 1901)  azithromycin  (ZITHROMAX ) 500 mg in sodium chloride  0.9 % 250 mL IVPB (0 mg Intravenous Stopped 04/11/24 1901)  ipratropium-albuterol  (DUONEB) 0.5-2.5 (3) MG/3ML nebulizer solution 3 mL (3 mLs Nebulization Given 04/11/24 1741)  morphine  (PF) 2 MG/ML injection 2 mg (2 mg Intravenous Given 04/11/24 1842)  ondansetron  (ZOFRAN ) injection 4 mg (4 mg Intravenous Given 04/11/24 1841)  lactated ringers  bolus 1,000 mL (0 mLs Intravenous Stopped 04/11/24 2106)  morphine  (PF) 2 MG/ML injection 2 mg (2 mg Intravenous Given  04/11/24 2037)  vancomycin  (VANCOREADY) IVPB 2000 mg/400 mL ( Intravenous IV Pump Association 04/11/24 2347)  meropenem  (MERREM ) 1 g in sodium chloride  0.9 % 100 mL IVPB (0 g Intravenous Stopped 04/11/24 2232)  meropenem  (MERREM ) 1 g in sodium chloride  0.9 % 100 mL IVPB (1 g Intravenous New Bag/Given 04/18/24 1033)  technetium albumin  aggregated (MAA) injection solution 4 millicurie (4.38 millicuries Intravenous Contrast Given 04/13/24 1125)  furosemide  (LASIX ) injection 80 mg (80 mg Intravenous Given 04/14/24 1707)  furosemide  (LASIX ) injection 40 mg (40 mg Intravenous Given 04/15/24 1619)  furosemide  (LASIX ) injection 40 mg (40 mg Intravenous Given 04/16/24  1123)  furosemide  (LASIX ) injection 40 mg (40 mg Intravenous Given 04/17/24 1431)  furosemide  (LASIX ) injection 40 mg (40 mg Intravenous Given 04/18/24 1319)                                    Medical Decision Making 63 year old male with past medical history of hypertension and hyperlipidemia presenting to the emergency department today with symptoms concerning for possible sepsis.  Will cover the patient empirically with Rocephin  and azithromycin  given the cough and shortness of breath.  This should cover for urinary pathogens as well.  I will give the patient's IV fluids based on his ideal body weight as it does appear that he is on Lasix  at baseline.  Will consider him giving him the full 40 mill per KG based on actual body weight depending on response.  The patient is placed on nasal cannula oxygen on arrival.  Will give him a DuoNeb here as well.  Will obtain an RSV/COVID/flu swab to evaluate for viral etiologies.  I will reevaluate but he will require admission.  Amount and/or Complexity of Data Reviewed Labs: ordered. Radiology: ordered.  Risk Prescription drug management. Decision regarding hospitalization.        Final diagnoses:  Lactic acidosis  AKI (acute kidney injury) (HCC)  Septic shock [facility])    ED Discharge Orders           Ordered    HYDROmorphone  (DILAUDID ) 4 MG tablet  Every 6 hours PRN        04/19/24 0815    feeding supplement (ENSURE PLUS HIGH PROTEIN) LIQD  2 times daily between meals        04/19/24 0815    pregabalin  (LYRICA ) 100 MG capsule  2 times daily        04/19/24 0831               Ula Prentice SAUNDERS, MD 04/20/24 2313

## 2024-04-11 NOTE — H&P (Signed)
 History and Physical    Brett White FMW:[id number] DOB: 1961-03-02 DOA: 04/11/2024  PCP: [facility]   Patient coming from: SNF   Chief Complaint: Fatigue, malaise, N/V, right flank pain  HPI: Brett White is a 63 y.o. male with medical history significant for hypertension, hyperlipidemia, insulin -dependent diabetes mellitus, depression, anxiety, insomnia, and left BKA who presents with fatigue, malaise, nausea, vomiting, and right flank pain.  Patient reports that he has been feeling generally poor for several days, has had nausea with nonbloody vomiting, loss of appetite, and pain in his right flank that is worse with deep breath.  He has also been experiencing exertional dyspnea, nonproductive cough, and decreased urine output.  He was seen for his right flank pain in the ED on 03/28/24 and was prescribed steroids for suspected musculoskeletal condition.   He was treated with IV fluids, Zofran , and 15 L/min supplemental oxygen prior to arrival in the ED.  ED Course: Upon arrival to the ED, patient is found to be afebrile and saturating in the mid 80s on 6 L/min of supplemental oxygen with elevated HR and stable BP.  Labs are most notable for glucose 277, creatinine 2.70, WBC 23,400, and lactic acid 5.4.  CT of the chest, abdomen, and pelvis reveals bibasilar atelectasis and nonobstructive right renal calculi that appears similar to prior.  Blood cultures were collected, UA was ordered, HFNC was applied at 15  and the patient was given 3.25 L LR, morphine  x 2, DuoNeb, Rocephin , and azithromycin .  Review of Systems:  All other systems reviewed and apart from HPI, are negative.  Past Medical History:  Diagnosis Date   CAP (community acquired pneumonia) 12/09/2021   CKD (chronic kidney disease)    GERD (gastroesophageal reflux disease)    HTN (hypertension)    Insulin  dependent type 2 diabetes mellitus (HCC)    Neuropathy    Osteomyelitis of great toe of left foot  (HCC) 02/03/2021   Stroke [facility])     Past Surgical History:  Procedure Laterality Date   ABDOMINAL AORTOGRAM W/LOWER EXTREMITY N/A 06/20/2023   Procedure: ABDOMINAL AORTOGRAM W/LOWER EXTREMITY;  Surgeon: Gretta Lonni PARAS, MD;  Location: [facility] IN[facility];  Service: Cardiovascular;  Laterality: N/A;   AMPUTATION Left 02/04/2021   Procedure: LEFT GREAT TOE AMPUTATION;  Surgeon: Harden Jerona GAILS, MD;  Location: [facility] OR;  Service: Orthopedics;  Laterality: Left;   AMPUTATION Left 02/10/2021   Procedure: LEFT FOOT 1ST RAY AMPUTATION;  Surgeon: Harden Jerona GAILS, MD;  Location: [facility] OR;  Service: Orthopedics;  Laterality: Left;   AMPUTATION Left 03/22/2021   Procedure: LEFT BELOW KNEE AMPUTATION;  Surgeon: Harden Jerona GAILS, MD;  Location: [facility] OR;  Service: Orthopedics;  Laterality: Left;   APPENDECTOMY     BACK SURGERY     CATARACT EXTRACTION     I & D EXTREMITY Left 03/03/2021   Procedure: LEFT FOOT DEBRIDEMENT;  Surgeon: Harden Jerona GAILS, MD;  Location: [facility] OR;  Service: Orthopedics;  Laterality: Left;   INCISION AND DRAINAGE ABSCESS N/A 10/06/2021   Procedure: INCISION AND DRAINAGE BACK ABSCESS;  Surgeon: Signe Mitzie LABOR, MD;  Location: [facility] OR;  Service: General;  Laterality: N/A;   INJECTION OF SILICONE OIL Left 07/25/2023   Procedure: INJECTION OF SILICONE OIL;  Surgeon: Valdemar Rogue, MD;  Location: [facility] OR;  Service: Ophthalmology;  Laterality: Left;   MEMBRANE PEEL Left 07/25/2023   Procedure: MEMBRANE PEEL;  Surgeon: Valdemar Rogue, MD;  Location: [facility] OR;  Service: Ophthalmology;  Laterality:  Left;   PARS PLANA VITRECTOMY Left 07/25/2023   Procedure: PARS PLANA VITRECTOMY WITH 25 GAUGE;  Surgeon: Valdemar Rogue, MD;  Location: [facility] OR;  Service: Ophthalmology;  Laterality: Left;   removal of back cyst     TONSILLECTOMY      Social History:   reports that he has never smoked. He has never used smokeless tobacco. He reports that he does not currently use alcohol . He reports that he does not currently  use drugs.  No Known Allergies  Family History  Problem Relation Age of Onset   Diabetes Mother    Heart attack Neg Hx      Prior to Admission medications   Medication Sig Start Date End Date Taking? Authorizing Provider  acetaminophen  (TYLENOL ) 325 MG tablet Take 2 tablets (650 mg total) by mouth every 6 (six) hours as needed for mild pain (or Fever >/= 101). 06/13/22   Jadine Toribio SQUIBB, MD  amLODipine  (NORVASC ) 10 MG tablet Take 10 mg by mouth every morning. 05/11/22   [provider]  atorvastatin  (LIPITOR) 40 MG tablet Take 40 mg by mouth at bedtime. 11/07/21   [provider]  bacitracin -polymyxin b  (POLYSPORIN ) ophthalmic ointment Place into the right eye 4 (four) times daily. Place a 1/2 inch ribbon of ointment into the lower eyelid 4 a day and as needed for discomfort 08/01/23   Valdemar Rogue, MD  bisacodyl  (DULCOLAX) 5 MG EC tablet Take 10 mg by mouth daily as needed (CONSTIPATION). Patient not taking: Reported on 03/20/2024    [provider]  brimonidine  (ALPHAGAN ) 0.2 % ophthalmic solution Place 1 drop into the left eye in the morning and at bedtime. 08/29/23   [provider]  carboxymethylcellulose 1 % ophthalmic solution Place 4 drops into the left eye in the morning, at noon, in the evening, and at bedtime.    [provider]  cyclobenzaprine  (FLEXERIL ) 5 MG tablet Take 1 tablet (5 mg total) by mouth 3 (three) times daily as needed for muscle spasms. 06/13/22   Jadine Toribio SQUIBB, MD  diclofenac  Sodium (VOLTAREN ) 1 % GEL Apply 4 g topically 4 (four) times daily. 03/28/24   Emil Share, DO  DULoxetine  (CYMBALTA ) 60 MG capsule Take 60 mg by mouth in the morning. 08/28/23   [provider]  ergocalciferol  (VITAMIN D2) 1.25 MG (50000 UT) capsule Take 50,000 Units by mouth once a week. Fridays    [provider]  erythromycin  ophthalmic ointment Place 1 Application into the left eye See admin instructions. Instill 1 ribbon  in left eye every 2 hours related to injury conjunctiva and corneal abrasion without foreign body, left eye, sequela    [provider]  famotidine  (PEPCID ) 20 MG tablet Take 20 mg by mouth in the morning. 04/20/23   [provider]  FARXIGA  10 MG TABS tablet Take 10 mg by mouth every morning. Dapaglifozin 02/07/22   [provider]  furosemide  (LASIX ) 20 MG tablet Take 20 mg by mouth every morning. 05/11/22   [provider]  glipiZIDE  (GLUCOTROL ) 10 MG tablet Take 10 mg by mouth every morning. 05/11/22   [provider]  guaiFENesin  (ROBITUSSIN) 100 MG/5ML liquid Take 10 mLs by mouth every 4 (four) hours as needed for cough or to loosen phlegm.    [provider]  insulin  lispro (HUMALOG) 100 UNIT/ML KwikPen Inject 2-8 Units into the skin 3 (three) times daily with meals. Sliding scale 201-250=2 units 251-300=4 units 301-350=6 units 351-400= 8 units 401 or  greater than=call [facility] 04/18/23   [provider]  LINZESS  145 MCG CAPS capsule Take 145 mcg by mouth in the morning. 06/11/23   [provider]  losartan  (COZAAR ) 100 MG tablet Take 0.5 tablets (50 mg total) by mouth in the morning. 03/24/24   Ezenduka, Nkeiruka J, MD  Melatonin 5 MG CAPS Take 10 mg by mouth at bedtime.    [provider]  methylPREDNISolone  (MEDROL  DOSEPAK) 4 MG TBPK tablet Day 1: 8mg  before breakfast, 4 mg after lunch, 4 mg after supper, and 8 mg at bedtime Day 2: 4 mg before breakfast, 4 mg after lunch, 4 mg  after supper, and 8 mg  at bedtime Day 3:  4 mg  before breakfast, 4 mg  after lunch, 4 mg after supper, and 4 mg  at bedtime Day 4: 4 mg  before breakfast, 4 mg  after lunch, and 4 mg at bedtime Day 5: 4 mg  before breakfast and 4 mg at bedtime Day 6: 4 mg  before breakfast 03/28/24   Floyd, Dan, DO  moxifloxacin (VIGAMOX) 0.5 % ophthalmic solution Place 1 drop into the left eye in the morning and at bedtime. For 7 days    [provider]   naloxone  (NARCAN ) nasal spray 4 mg/0.1 mL Place 1 spray into the nose See admin instructions. Every 2-3 minutesas needed until patient response/ emba arrived. 11/18/22   Krishnan, Gokul, MD  pantoprazole  (PROTONIX ) 40 MG tablet Take 1 tablet (40 mg total) by mouth daily. Patient taking differently: Take 40 mg by mouth in the morning. 10/20/21   Ghimire, Donalda HERO, MD  polyethylene glycol (MIRALAX  / GLYCOLAX ) 17 g packet Take 17 g by mouth daily. Patient taking differently: Take 17 g by mouth in the morning. With 4 oz of fluids and drink 11/18/22   Krishnan, Gokul, MD  pregabalin  (LYRICA ) 100 MG capsule Take 1 capsule (100 mg total) by mouth 2 (two) times daily. 11/18/22   Krishnan, Gokul, MD  sertraline  (ZOLOFT ) 50 MG tablet Take 50 mg by mouth in the morning. 04/30/23   [provider]  tamsulosin  (FLOMAX ) 0.4 MG CAPS capsule Take 1 capsule (0.4 mg total) by mouth daily after supper. 03/24/24 04/23/24  Ezenduka, Nkeiruka J, MD  traZODone  (DESYREL ) 150 MG tablet Take 75 mg by mouth at bedtime. 05/30/23   [provider]  TRULICITY  1.5 MG/0.5ML SOPN Inject 0.5 mg into the skin once a week. On Thursdays 04/04/23   [provider]    Physical Exam: Vitals:   04/11/24 2030 04/11/24 2037 04/11/24 2100 04/11/24 2115  BP:   125/75 107/66  Pulse: 94  97 95  Resp:  18    Temp:  98.3 F (36.8 C)    TempSrc:  Oral    SpO2: 98%  97% 98%  Weight:      Height:        Constitutional: NAD, no pallor or diaphoresis  Eyes: PERTLA, lids and conjunctivae normal ENMT: Mucous membranes are moist. Posterior pharynx clear of any exudate or lesions.   Neck: supple, no masses  Respiratory: no wheezing, no crackles. No accessory muscle use.  Cardiovascular: S1 & S2 heard, regular rate and rhythm. No extremity edema.  Abdomen: No tenderness, soft. Bowel sounds active.  Musculoskeletal: no clubbing / cyanosis. S/p left BKA. No meningismus.   Skin: no significant rashes, lesions, ulcers. Warm,  dry, well-perfused. Neurologic: CN 2-12 grossly intact. Moving all extremities. Alert and oriented.  Psychiatric: Calm. Cooperative.  Labs and Imaging on Admission: I have personally reviewed following labs and imaging studies  CBC: Recent Labs  Lab 04/11/24 1740  WBC 23.4*  NEUTROABS 20.9*  HGB 11.1*  HCT 36.5*  MCV 80.9  PLT 205   Basic Metabolic Panel: Recent Labs  Lab 04/11/24 1740  NA 134*  K 4.7  CL 97*  CO2 20*  GLUCOSE 277*  BUN 22*  CREATININE 2.70*  CALCIUM  8.6*   GFR: Estimated Creatinine Clearance: 40.1 mL/min (A) (by C-G formula based on SCr of 2.7 mg/dL (H)). Liver Function Tests: Recent Labs  Lab 04/11/24 1740  AST 31  ALT 20  ALKPHOS 90  BILITOT 0.9  PROT 7.0  ALBUMIN  3.0*   No results for input(s): LIPASE, AMYLASE in the last 168 hours. No results for input(s): AMMONIA in the last 168 hours. Coagulation Profile: Recent Labs  Lab 04/11/24 1740  INR 1.2   Cardiac Enzymes: No results for input(s): CKTOTAL, CKMB, CKMBINDEX, TROPONINI in the last 168 hours. BNP (last 3 results) No results for input(s): PROBNP in the last 8760 hours. HbA1C: No results for input(s): HGBA1C in the last 72 hours. CBG: Recent Labs  Lab 04/11/24 1736  GLUCAP 285*   Lipid Profile: No results for input(s): CHOL, HDL, LDLCALC, TRIG, CHOLHDL, LDLDIRECT in the last 72 hours. Thyroid Function Tests: No results for input(s): TSH, T4TOTAL, FREET4, T3FREE, THYROIDAB in the last 72 hours. Anemia Panel: No results for input(s): VITAMINB12, FOLATE, FERRITIN, TIBC, IRON, RETICCTPCT in the last 72 hours. Urine analysis:    Component Value Date/Time   COLORURINE STRAW (A) 03/28/2024 0854   APPEARANCEUR CLEAR 03/28/2024 0854   LABSPEC 1.014 03/28/2024 0854   PHURINE 8.0 03/28/2024 0854   GLUCOSEU >=500 (A) 03/28/2024 0854   HGBUR NEGATIVE 03/28/2024 0854   BILIRUBINUR NEGATIVE 03/28/2024 0854   KETONESUR  NEGATIVE 03/28/2024 0854   PROTEINUR NEGATIVE 03/28/2024 0854   NITRITE NEGATIVE 03/28/2024 0854   LEUKOCYTESUR NEGATIVE 03/28/2024 0854   Sepsis Labs: @LABRCNTIP (procalcitonin:4,lacticidven:4) ) Recent Results (from the past 240 hours)  Blood Culture (routine x 2)     Status: None (Preliminary result)   Collection Time: 04/11/24  5:46 PM   Specimen: BLOOD LEFT HAND  Result Value Ref Range Status   Specimen Description BLOOD LEFT HAND  Final   Special Requests   Final    BOTTLES DRAWN AEROBIC AND ANAEROBIC Blood Culture adequate volume Performed at [facility], [address], [place], [place] [address]    Culture PENDING  Incomplete   Report Status PENDING  Incomplete  Resp panel by RT-PCR (RSV, Flu A&B, Covid) Anterior Nasal Swab     Status: None   Collection Time: 04/11/24  6:50 PM   Specimen: Anterior Nasal Swab  Result Value Ref Range Status   SARS Coronavirus 2 by RT PCR NEGATIVE NEGATIVE Final    Comment: (NOTE) SARS-CoV-2 target nucleic acids are NOT DETECTED.  The SARS-CoV-2 RNA is generally detectable in upper respiratory specimens during the acute phase of infection. The lowest concentration of SARS-CoV-2 viral copies this assay can detect is 138 copies/mL. A negative result does not preclude SARS-Cov-2 infection and should not be used as the sole basis for treatment or other patient management decisions. A negative result may occur with  improper specimen collection/handling, submission of specimen other than nasopharyngeal swab, presence of viral mutation(s) within the areas targeted by this assay, and inadequate number of viral copies(<138 copies/mL). A negative result must be combined with clinical observations, patient  history, and epidemiological information. The expected result is Negative.  Fact Sheet for Patients:  BloggerCourse.com  Fact Sheet for Healthcare Providers:  SeriousBroker.it  This  test is no t yet approved or cleared by the [place]  FDA and  has been authorized for detection and/or diagnosis of SARS-CoV-2 by FDA under an Emergency Use Authorization (EUA). This EUA will remain  in effect (meaning this test can be used) for the duration of the COVID-19 declaration under Section 564(b)(1) of the Act, 21 U.S.C.section 360bbb-3(b)(1), unless the authorization is terminated  or revoked sooner.       Influenza A by PCR NEGATIVE NEGATIVE Final   Influenza B by PCR NEGATIVE NEGATIVE Final    Comment: (NOTE) The Xpert Xpress SARS-CoV-2/FLU/RSV plus assay is intended as an aid in the diagnosis of influenza from Nasopharyngeal swab specimens and should not be used as a sole basis for treatment. Nasal washings and aspirates are unacceptable for Xpert Xpress SARS-CoV-2/FLU/RSV testing.  Fact Sheet for Patients: BloggerCourse.com  Fact Sheet for Healthcare Providers: SeriousBroker.it  This test is not yet approved or cleared by the [place]  FDA and has been authorized for detection and/or diagnosis of SARS-CoV-2 by FDA under an Emergency Use Authorization (EUA). This EUA will remain in effect (meaning this test can be used) for the duration of the COVID-19 declaration under Section 564(b)(1) of the Act, 21 U.S.C. section 360bbb-3(b)(1), unless the authorization is terminated or revoked.     Resp Syncytial Virus by PCR NEGATIVE NEGATIVE Final    Comment: (NOTE) Fact Sheet for Patients: BloggerCourse.com  Fact Sheet for Healthcare Providers: SeriousBroker.it  This test is not yet approved or cleared by the [place]  FDA and has been authorized for detection and/or diagnosis of SARS-CoV-2 by FDA under an Emergency Use Authorization (EUA). This EUA will remain in effect (meaning this test can be used) for the duration of the COVID-19 declaration under  Section 564(b)(1) of the Act, 21 U.S.C. section 360bbb-3(b)(1), unless the authorization is terminated or revoked.  Performed at [facility], [address], [place], [place] [address]      Radiological Exams on Admission: CT CHEST ABDOMEN PELVIS WO CONTRAST Result Date: 04/11/2024 CLINICAL DATA:  Sepsis EXAM: CT CHEST, ABDOMEN AND PELVIS WITHOUT CONTRAST TECHNIQUE: Multidetector CT imaging of the chest, abdomen and pelvis was performed following the standard protocol without IV contrast. RADIATION DOSE REDUCTION: This exam was performed according to the departmental dose-optimization program which includes automated exposure control, adjustment of the mA and/or kV according to patient size and/or use of iterative reconstruction technique. COMPARISON:  Chest x-ray from earlier in the same day, CT from 03/28/2024. FINDINGS: CT CHEST FINDINGS Cardiovascular: Somewhat limited due to lack of IV contrast. Thoracic aorta shows no aneurysmal dilatation. Mild atherosclerotic calcifications are seen. Pulmonary artery is not significantly enlarged. The heart is within normal limits. No significant coronary calcifications are noted. Mediastinum/Nodes: Thoracic inlet is within normal limits. No hilar or mediastinal adenopathy is noted. The esophagus as visualized is within normal limits. Lungs/Pleura: Mild bibasilar atelectatic changes are seen. No sizable effusion is noted. No parenchymal nodules are seen. Musculoskeletal: No chest wall mass or suspicious bone lesions identified. CT ABDOMEN PELVIS FINDINGS Hepatobiliary: Liver and gallbladder are within normal limits. Pancreas: Unremarkable. No pancreatic ductal dilatation or surrounding inflammatory changes. Spleen: Normal in size without focal abnormality. Adrenals/Urinary Tract: Adrenal glands are within normal limits. Kidneys demonstrate bilateral perinephric stranding similar to that seen on the prior exam. Simple renal cyst is  noted  centrally in the left kidney. No follow-up is recommended. Scattered nonobstructing stones are noted on the right the largest of which measures 10 mm. The ureters are within normal limits. No obstructive changes are seen. Hyperdense cyst is again noted in the right kidney inferiorly. No follow-up is recommended. The bladder is partially distended. Stomach/Bowel: No obstructive or inflammatory changes of the colon are noted. The appendix is not visualized consistent with a prior surgical history. Stomach and small bowel are within normal limits. Vascular/Lymphatic: Aortic atherosclerosis. No enlarged abdominal or pelvic lymph nodes. Reproductive: Prostate is unremarkable. Other: Small fat containing umbilical hernia is noted. No abdominopelvic ascites. Musculoskeletal: Postsurgical changes are noted in the lower lumbar spine. IMPRESSION: CT of the chest: Bibasilar atelectasis without sizable effusion. CT of the abdomen and pelvis: Nonobstructing right renal calculi similar to that seen on the prior exam. No obstructive changes are seen. Electronically Signed   By: Oneil Devonshire M.D.   On: 04/11/2024 20:04   DG Chest Port 1 View Result Date: 04/11/2024 CLINICAL DATA:  Nausea, vomiting and diarrhea 3 days. Fell today. Questionable sepsis. EXAM: PORTABLE CHEST 1 VIEW COMPARISON:  Chest x-ray 03/24/2024 FINDINGS: The cardiac silhouette, mediastinal and hilar contours are within normal limits and stable given the AP projection, portable technique and low lung volumes. Low lung volumes with vascular crowding and bibasilar atelectasis. No definite infiltrates, effusions or pneumothorax. IMPRESSION: Low lung volumes with vascular crowding and bibasilar atelectasis. Electronically Signed   By: MYRTIS Stammer M.D.   On: 04/11/2024 18:26    EKG: Independently reviewed. Sinus tachycardia, rate 116.   Assessment/Plan   1. Acute hypoxic respiratory failure  - No findings on non-contrast chest CT to explain his degree of  hypoxia; fortunately, he does not appear to be in distress  - Start incentive spirometry, continue supplemental O2, check V/Q scan    2. SIRS  - WBC is 23.4 in setting of recent steroid use, HR is elevated, and initial lactate was high  - He is hypoxic but there is no evidence for infection on chest CT  - He was recently admitted with ESBL UTI   - Check UA, treat with vancomycin  and meropenem  for now, follow cultures and clinical course   3. AKI superimposed on CKD II  - No obstructive changes on CT in ED, likely prerenal in setting of recent N/V and anorexia  - Hold Lasix , losartan , and Farxiga , continue IVF hydration, repeat chem panel in am    4. Hypertension  - Treat as-needed only for now    5. Type II DM  - A1c was 8.1% in February 2025  - Check CBGs and use SSI only for now    DVT prophylaxis: sq heparin   Code Status: Full  Level of Care: Level of care: Stepdown Family Communication: None present  Disposition Plan:  Patient is from: SNF  Anticipated d/c is to: SNF  Anticipated d/c date is: 04/14/24 Patient currently: Pending UA, cultures, V/Q scan, improved renal function, tolerance of adequate oral intake  Consults called: None  Admission status: Inpatient     Evalene GORMAN Sprinkles, MD [facility]  04/11/2024, 9:29 PM

## 2024-04-11 NOTE — ED Notes (Signed)
 Hooked patient back up to fluids from CT. Previously hung infusions was not going in to gravity. Placed on pumps to finish LR bolus. Patient is currently on 15L NRB sats around 93. Was trailed on 6L [place] and was staying around 85%.

## 2024-04-11 NOTE — ED Triage Notes (Signed)
 Pt BIB ems for N/V/D for 3 days. Had a fall today, denies hitting his head, not on blood thinners. Hasn't eaten anything in 12 days. Diabetic, and has hx of pneumonia x3 this year. On 15L on non-rebreather 98%.  Given LR by ems

## 2024-04-12 ENCOUNTER — Other Ambulatory Visit: Payer: Self-pay

## 2024-04-12 DIAGNOSIS — N179 Acute kidney failure, unspecified: Secondary | ICD-10-CM | POA: Diagnosis not present

## 2024-04-12 DIAGNOSIS — N182 Chronic kidney disease, stage 2 (mild): Secondary | ICD-10-CM | POA: Diagnosis not present

## 2024-04-12 LAB — GLUCOSE, CAPILLARY
Glucose-Capillary: 228 mg/dL — ABNORMAL HIGH (ref 70–99)
Glucose-Capillary: 276 mg/dL — ABNORMAL HIGH (ref 70–99)
Glucose-Capillary: 213 mg/dL — ABNORMAL HIGH (ref 70–99)
Glucose-Capillary: 257 mg/dL — ABNORMAL HIGH (ref 70–99)

## 2024-04-12 LAB — BASIC METABOLIC PANEL WITH GFR
Anion gap: 11 (ref 5–15)
BUN: 24 mg/dL — ABNORMAL HIGH (ref 6–20)
CO2: 23 mmol/L (ref 22–32)
Calcium: 8.2 mg/dL — ABNORMAL LOW (ref 8.9–10.3)
Chloride: 101 mmol/L (ref 98–111)
Creatinine, Ser: 2.2 mg/dL — ABNORMAL HIGH (ref 0.61–1.24)
GFR, Estimated: 33 mL/min — ABNORMAL LOW (ref >60)
Glucose, Bld: 189 mg/dL — ABNORMAL HIGH (ref 70–99)
Potassium: 4.6 mmol/L (ref 3.5–5.1)
Sodium: 135 mmol/L (ref 135–145)

## 2024-04-12 LAB — CBC
HCT: 36.7 % — ABNORMAL LOW (ref 39.0–52.0)
Hemoglobin: 10.4 g/dL — ABNORMAL LOW (ref 13.0–17.0)
MCH: 24.6 pg — ABNORMAL LOW (ref 26.0–34.0)
MCHC: 28.3 g/dL — ABNORMAL LOW (ref 30.0–36.0)
MCV: 86.8 fL (ref 80.0–100.0)
Platelets: 160 K/uL (ref 150–400)
RBC: 4.23 MIL/uL (ref 4.22–5.81)
RDW: 18.6 % — ABNORMAL HIGH (ref 11.5–15.5)
WBC: 16.9 K/uL — ABNORMAL HIGH (ref 4.0–10.5)
nRBC: 0 % (ref 0.0–0.2)

## 2024-04-12 LAB — MRSA NEXT GEN BY PCR, NASAL: MRSA by PCR Next Gen: DETECTED — AB

## 2024-04-12 MED ORDER — SODIUM CHLORIDE 0.9 % IV SOLN: INTRAVENOUS | Status: DC

## 2024-04-12 MED ORDER — VANCOMYCIN HCL 1500 MG/300ML IV SOLN
1500.0000 mg | INTRAVENOUS | Status: DC
  Administered 2024-04-12: 1500 mg via INTRAVENOUS
  Filled 2024-04-12: qty 300

## 2024-04-12 MED ORDER — CHLORHEXIDINE GLUCONATE CLOTH 2 % EX PADS
6.0000 | MEDICATED_PAD | Freq: Every day | CUTANEOUS | Status: DC
  Administered 2024-04-12 – 2024-04-19 (×7): 6 via TOPICAL

## 2024-04-12 MED ORDER — MUPIROCIN 2 % EX OINT
1.0000 | TOPICAL_OINTMENT | Freq: Two times a day (BID) | CUTANEOUS | Status: AC
Start: 2024-04-12 — End: 2024-04-17
  Administered 2024-04-12 – 2024-04-16 (×9): 1 via NASAL
  Filled 2024-04-12: qty 22

## 2024-04-12 MED ORDER — SODIUM CHLORIDE 0.9% FLUSH
10.0000 mL | Freq: Two times a day (BID) | INTRAVENOUS | Status: DC
  Administered 2024-04-12 – 2024-04-14 (×5): 10 mL
  Administered 2024-04-15: 20 mL
  Administered 2024-04-15 – 2024-04-16 (×2): 10 mL
  Administered 2024-04-16: 20 mL
  Administered 2024-04-17 – 2024-04-19 (×5): 10 mL

## 2024-04-12 MED ORDER — ENSURE PLUS HIGH PROTEIN PO LIQD
237.0000 mL | Freq: Two times a day (BID) | ORAL | Status: DC
  Administered 2024-04-12 – 2024-04-19 (×6): 237 mL via ORAL

## 2024-04-12 MED ORDER — SODIUM CHLORIDE 0.9% FLUSH: 10.0000 mL | INTRAVENOUS | Status: DC | PRN

## 2024-04-12 MED ORDER — ORAL CARE MOUTH RINSE: 15.0000 mL | OROMUCOSAL | Status: DC | PRN

## 2024-04-12 NOTE — Progress Notes (Signed)
 PROGRESS NOTE    Brett White  FMW:[id number] DOB: [birth year] DOA: 04/11/2024 PCP: [facility]   Brief Narrative:   63 y.o. male with medical history significant for hypertension, hyperlipidemia, insulin -dependent diabetes mellitus, depression, anxiety, insomnia, IBS, chronic pain syndrome, left BKA and recent hospitalization from 03/20/2024-03/24/2024 for ESBL E. coli UTI treated with 7 days of IV antibiotics presented with fatigue, malaise, nausea, vomiting and flank pain.  On presentation, he was hypoxic and required 15 L high flow nasal cannula oxygen.  Creatinine of 2.7, WBC of 23.4, lactic acid of 5.4.  CT of chest, abdomen, pelvis revealed bibasilar atelectasis and nonobstructive right renal calculi, appearing similar to prior.  He was started on IV fluids and antibiotics.  Assessment & Plan:   Acute respiratory failure with hypoxia - Questionable cause.  CT chest unremarkable.  Continue supplemental oxygen.  Wean off as able.  VQ scan pending. -Will start scheduled nebs  Severe sepsis: Present on admission Possible UTI: Present on admission - Presented with leukocytosis, lactic acidosis, acute kidney injury, hypoxia with possible UTI -Continue broad-spectrum antibiotics and IV fluids.  Follow cultures.  Influenza/RSV/COVID PCR negative on presentation  AKI on CKD stage II - Creatinine 2.7 on presentation.  Improving to 2.2.  Repeat a.m. labs.  Continue IV fluids.  Lactic acidosis: Resolved  Leukocytosis - Improving  Anemia of chronic disease - From chronic illnesses.  Hemoglobin stable.  No signs of bleeding  Hypertension Hyperlipidemia --monitor blood pressure.  Intermittently on the lower side. - Continue statin  Hyponatremia -Improved  Diabetes mellitus type 2 with hyperglycemia - A1c 8.1 in February 2025.  Continue CBGs with SSI.  Carb modified diet  Anxiety and depression Insomnia - Continue sertraline  and trazodone   Chronic pain syndrome  with opiate dependence - Continue current pain management regimen.  Outpatient follow-up with PCP and/or pain management   DVT prophylaxis: Heparin  subcutaneous Code Status: Full Family Communication: None at bedside Disposition Plan: Status is: Inpatient Remains inpatient appropriate because: Of severity of illness    Consultants: None  Procedures: None  Antimicrobials:  Anti-infectives (From admission, onward)    Start     Dose/Rate Route Frequency Ordered Stop   04/13/24 1000  vancomycin  (VANCOREADY) IVPB 1500 mg/300 mL        1,500 mg 150 mL/hr over 120 Minutes Intravenous Every 36 hours 04/11/24 2241     04/12/24 1000  meropenem  (MERREM ) 1 g in sodium chloride  0.9 % 100 mL IVPB        1 g 200 mL/hr over 30 Minutes Intravenous Every 12 hours 04/11/24 2241     04/11/24 2145  vancomycin  (VANCOREADY) IVPB 2000 mg/400 mL        2,000 mg 200 mL/hr over 120 Minutes Intravenous  Once 04/11/24 2135 04/12/24 0000   04/11/24 2145  meropenem  (MERREM ) 1 g in sodium chloride  0.9 % 100 mL IVPB        1 g 200 mL/hr over 30 Minutes Intravenous  Once 04/11/24 2135 04/11/24 2232   04/11/24 2130  vancomycin  (VANCOCIN ) IVPB 1000 mg/200 mL premix  Status:  Discontinued        1,000 mg 200 mL/hr over 60 Minutes Intravenous  Once 04/11/24 2129 04/11/24 2134   04/11/24 1745  cefTRIAXone  (ROCEPHIN ) 2 g in sodium chloride  0.9 % 100 mL IVPB        2 g 200 mL/hr over 30 Minutes Intravenous Once 04/11/24 1731 04/11/24 1901   04/11/24 1745  azithromycin  (ZITHROMAX ) 500 mg in  sodium chloride  0.9 % 250 mL IVPB        500 mg 250 mL/hr over 60 Minutes Intravenous  Once 04/11/24 1731 04/11/24 1901        Subjective: Patient seen and examined at bedside.  Short of breath with exertion.  No fever, vomiting or chest pain reported.  Feels weak.  Complains of back and flank pain.  Objective: Vitals:   04/11/24 2230 04/12/24 0028 04/12/24 0100 04/12/24 0200  BP: 115/76 (!) 113/53 (!) 108/47 121/75   Pulse: 91 98 97 98  Resp: 13 14 20 17   Temp:      TempSrc:      SpO2: 98% 96% 92% 92%  Weight:      Height:        Intake/Output Summary (Last 24 hours) at 04/12/2024 0739 Last data filed at 04/12/2024 0323 Gross per 24 hour  Intake 1461.05 ml  Output --  Net 1461.05 ml   Filed Weights   04/11/24 1951  Weight: 120.2 kg    Examination:  General exam: Appears calm and comfortable.  Chronically ill and deconditioned looking. Respiratory system: Bilateral decreased breath sounds at bases Cardiovascular system: S1 & S2 heard, Rate controlled Gastrointestinal system: Abdomen is nondistended, soft and nontender. Normal bowel sounds heard. Extremities: Left BKA present  Central nervous system: Alert and oriented. No focal neurological deficits. Moving extremities Skin: No rashes, lesions or ulcers Psychiatry: Flat affect.  Not agitated.    Data Reviewed: I have personally reviewed following labs and imaging studies  CBC: Recent Labs  Lab 04/11/24 1740 04/12/24 0338  WBC 23.4* 16.9*  NEUTROABS 20.9*  --   HGB 11.1* 10.4*  HCT 36.5* 36.7*  MCV 80.9 86.8  PLT 205 160   Basic Metabolic Panel: Recent Labs  Lab 04/11/24 1740 04/12/24 0338  NA 134* 135  K 4.7 4.6  CL 97* 101  CO2 20* 23  GLUCOSE 277* 189*  BUN 22* 24*  CREATININE 2.70* 2.20*  CALCIUM  8.6* 8.2*   GFR: Estimated Creatinine Clearance: 49.2 mL/min (A) (by C-G formula based on SCr of 2.2 mg/dL (H)). Liver Function Tests: Recent Labs  Lab 04/11/24 1740  AST 31  ALT 20  ALKPHOS 90  BILITOT 0.9  PROT 7.0  ALBUMIN  3.0*   No results for input(s): LIPASE, AMYLASE in the last 168 hours. No results for input(s): AMMONIA in the last 168 hours. Coagulation Profile: Recent Labs  Lab 04/11/24 1740  INR 1.2   Cardiac Enzymes: No results for input(s): CKTOTAL, CKMB, CKMBINDEX, TROPONINI in the last 168 hours. BNP (last 3 results) No results for input(s): PROBNP in the last 8760  hours. HbA1C: No results for input(s): HGBA1C in the last 72 hours. CBG: Recent Labs  Lab 04/11/24 1736 04/11/24 2210  GLUCAP 285* 224*   Lipid Profile: No results for input(s): CHOL, HDL, LDLCALC, TRIG, CHOLHDL, LDLDIRECT in the last 72 hours. Thyroid Function Tests: No results for input(s): TSH, T4TOTAL, FREET4, T3FREE, THYROIDAB in the last 72 hours. Anemia Panel: No results for input(s): VITAMINB12, FOLATE, FERRITIN, TIBC, IRON, RETICCTPCT in the last 72 hours. Sepsis Labs: Recent Labs  Lab 04/11/24 1750 04/11/24 2056  LATICACIDVEN 5.4* 1.9    Recent Results (from the past 240 hours)  Blood Culture (routine x 2)     Status: None (Preliminary result)   Collection Time: 04/11/24  5:46 PM   Specimen: BLOOD LEFT HAND  Result Value Ref Range Status   Specimen Description BLOOD LEFT HAND  Final  Special Requests   Final    BOTTLES DRAWN AEROBIC AND ANAEROBIC Blood Culture adequate volume Performed at [facility], [address] [place], [place] [address]    Culture PENDING  Incomplete   Report Status PENDING  Incomplete  Resp panel by RT-PCR (RSV, Flu A&B, Covid) Anterior Nasal Swab     Status: None   Collection Time: 04/11/24  6:50 PM   Specimen: Anterior Nasal Swab  Result Value Ref Range Status   SARS Coronavirus 2 by RT PCR NEGATIVE NEGATIVE Final    Comment: (NOTE) SARS-CoV-2 target nucleic acids are NOT DETECTED.  The SARS-CoV-2 RNA is generally detectable in upper respiratory specimens during the acute phase of infection. The lowest concentration of SARS-CoV-2 viral copies this assay can detect is 138 copies/mL. A negative result does not preclude SARS-Cov-2 infection and should not be used as the sole basis for treatment or other patient management decisions. A negative result may occur with  improper specimen collection/handling, submission of specimen other than nasopharyngeal swab, presence of viral mutation(s)  within the areas targeted by this assay, and inadequate number of viral copies(<138 copies/mL). A negative result must be combined with clinical observations, patient history, and epidemiological information. The expected result is Negative.  Fact Sheet for Patients:  BloggerCourse.com  Fact Sheet for Healthcare Providers:  SeriousBroker.it  This test is no t yet approved or cleared by the [place]  FDA and  has been authorized for detection and/or diagnosis of SARS-CoV-2 by FDA under an Emergency Use Authorization (EUA). This EUA will remain  in effect (meaning this test can be used) for the duration of the COVID-19 declaration under Section 564(b)(1) of the Act, 21 U.S.C.section 360bbb-3(b)(1), unless the authorization is terminated  or revoked sooner.       Influenza A by PCR NEGATIVE NEGATIVE Final   Influenza B by PCR NEGATIVE NEGATIVE Final    Comment: (NOTE) The Xpert Xpress SARS-CoV-2/FLU/RSV plus assay is intended as an aid in the diagnosis of influenza from Nasopharyngeal swab specimens and should not be used as a sole basis for treatment. Nasal washings and aspirates are unacceptable for Xpert Xpress SARS-CoV-2/FLU/RSV testing.  Fact Sheet for Patients: BloggerCourse.com  Fact Sheet for Healthcare Providers: SeriousBroker.it  This test is not yet approved or cleared by the [place]  FDA and has been authorized for detection and/or diagnosis of SARS-CoV-2 by FDA under an Emergency Use Authorization (EUA). This EUA will remain in effect (meaning this test can be used) for the duration of the COVID-19 declaration under Section 564(b)(1) of the Act, 21 U.S.C. section 360bbb-3(b)(1), unless the authorization is terminated or revoked.     Resp Syncytial Virus by PCR NEGATIVE NEGATIVE Final    Comment: (NOTE) Fact Sheet for  Patients: BloggerCourse.com  Fact Sheet for Healthcare Providers: SeriousBroker.it  This test is not yet approved or cleared by the [place]  FDA and has been authorized for detection and/or diagnosis of SARS-CoV-2 by FDA under an Emergency Use Authorization (EUA). This EUA will remain in effect (meaning this test can be used) for the duration of the COVID-19 declaration under Section 564(b)(1) of the Act, 21 U.S.C. section 360bbb-3(b)(1), unless the authorization is terminated or revoked.  Performed at [facility], [address] [place], [place] [address]   MRSA Next Gen by PCR, Nasal     Status: Abnormal   Collection Time: 04/12/24 12:33 AM   Specimen: Nasal Mucosa; Nasal Swab  Result Value Ref Range Status  MRSA by PCR Next Gen DETECTED (A) NOT DETECTED Final    Comment: CRITICAL RESULT CALLED TO, READ BACK BY AND VERIFIED WITH: REXIE MANO RN AT 04/12/2024 0609 BY JEREMY C (NOTE) The GeneXpert MRSA Assay (FDA approved for NASAL specimens only), is one component of a comprehensive MRSA colonization surveillance program. It is not intended to diagnose MRSA infection nor to guide or monitor treatment for MRSA infections. Test performance is not FDA approved in patients less than 18 years old. Performed at [facility], [address], [place], [place] [address]          Radiology Studies: CT CHEST ABDOMEN PELVIS WO CONTRAST Result Date: 04/11/2024 CLINICAL DATA:  Sepsis EXAM: CT CHEST, ABDOMEN AND PELVIS WITHOUT CONTRAST TECHNIQUE: Multidetector CT imaging of the chest, abdomen and pelvis was performed following the standard protocol without IV contrast. RADIATION DOSE REDUCTION: This exam was performed according to the departmental dose-optimization program which includes automated exposure control, adjustment of the mA and/or kV according to patient size and/or use of iterative  reconstruction technique. COMPARISON:  Chest x-ray from earlier in the same day, CT from 03/28/2024. FINDINGS: CT CHEST FINDINGS Cardiovascular: Somewhat limited due to lack of IV contrast. Thoracic aorta shows no aneurysmal dilatation. Mild atherosclerotic calcifications are seen. Pulmonary artery is not significantly enlarged. The heart is within normal limits. No significant coronary calcifications are noted. Mediastinum/Nodes: Thoracic inlet is within normal limits. No hilar or mediastinal adenopathy is noted. The esophagus as visualized is within normal limits. Lungs/Pleura: Mild bibasilar atelectatic changes are seen. No sizable effusion is noted. No parenchymal nodules are seen. Musculoskeletal: No chest wall mass or suspicious bone lesions identified. CT ABDOMEN PELVIS FINDINGS Hepatobiliary: Liver and gallbladder are within normal limits. Pancreas: Unremarkable. No pancreatic ductal dilatation or surrounding inflammatory changes. Spleen: Normal in size without focal abnormality. Adrenals/Urinary Tract: Adrenal glands are within normal limits. Kidneys demonstrate bilateral perinephric stranding similar to that seen on the prior exam. Simple renal cyst is noted centrally in the left kidney. No follow-up is recommended. Scattered nonobstructing stones are noted on the right the largest of which measures 10 mm. The ureters are within normal limits. No obstructive changes are seen. Hyperdense cyst is again noted in the right kidney inferiorly. No follow-up is recommended. The bladder is partially distended. Stomach/Bowel: No obstructive or inflammatory changes of the colon are noted. The appendix is not visualized consistent with a prior surgical history. Stomach and small bowel are within normal limits. Vascular/Lymphatic: Aortic atherosclerosis. No enlarged abdominal or pelvic lymph nodes. Reproductive: Prostate is unremarkable. Other: Small fat containing umbilical hernia is noted. No abdominopelvic ascites.  Musculoskeletal: Postsurgical changes are noted in the lower lumbar spine. IMPRESSION: CT of the chest: Bibasilar atelectasis without sizable effusion. CT of the abdomen and pelvis: Nonobstructing right renal calculi similar to that seen on the prior exam. No obstructive changes are seen. Electronically Signed   By: Oneil Devonshire M.D.   On: 04/11/2024 20:04   DG Chest Port 1 View Result Date: 04/11/2024 CLINICAL DATA:  Nausea, vomiting and diarrhea 3 days. Fell today. Questionable sepsis. EXAM: PORTABLE CHEST 1 VIEW COMPARISON:  Chest x-ray 03/24/2024 FINDINGS: The cardiac silhouette, mediastinal and hilar contours are within normal limits and stable given the AP projection, portable technique and low lung volumes. Low lung volumes with vascular crowding and bibasilar atelectasis. No definite infiltrates, effusions or pneumothorax. IMPRESSION: Low lung volumes with vascular crowding and bibasilar atelectasis. Electronically Signed   By: MYRTIS Marven HERO.D.  On: 04/11/2024 18:26        Scheduled Meds:  atorvastatin   40 mg Oral QHS   brimonidine   1 drop Left Eye BID   Chlorhexidine  Gluconate Cloth  6 each Topical Daily   erythromycin   1 Application Left Eye Q2H   feeding supplement  237 mL Oral BID BM   heparin   5,000 Units Subcutaneous Q8H   insulin  aspart  0-5 Units Subcutaneous QHS   insulin  aspart  0-6 Units Subcutaneous TID WC   sertraline   50 mg Oral Daily   sodium chloride  flush  3 mL Intravenous Q12H   traZODone   75 mg Oral QHS   Continuous Infusions:  lactated ringers  125 mL/hr at 04/12/24 0323   meropenem  (MERREM ) IV     [START ON 04/13/2024] vancomycin             Sophie Mao, MD [facility] 04/12/2024, 7:39 AM

## 2024-04-12 NOTE — Progress Notes (Signed)
 Pharmacy Antibiotic Note  Brett White is a 63 y.o. male admitted on 04/11/2024 with  Fatigue, malaise, N/V, right flank pain .  Pharmacy has been consulted for vancomycin  dosing.  Today, the Scr improved from 2.7 to 2.2   Plan: Adjust vancomycin  dose to 1500mg  q24h (AUC 535, Scr 2.2) Follow renal function, cultures and clinical course  Height: 6' 2 (188 cm) Weight: 120.2 kg (265 lb) IBW/kg (Calculated) : 82.2  Temp (24hrs), Avg:98.4 F (36.9 C), Min:98.2 F (36.8 C), Max:98.6 F (37 C)  Recent Labs  Lab 04/11/24 1740 04/11/24 1750 04/11/24 2056 04/12/24 0338  WBC 23.4*  --   --  16.9*  CREATININE 2.70*  --   --  2.20*  LATICACIDVEN  --  5.4* 1.9  --     Estimated Creatinine Clearance: 49.2 mL/min (A) (by C-G formula based on SCr of 2.2 mg/dL (H)).    No Known Allergies  Antimicrobials this admission: 7/12 vancomycin  >> 7/12 merrem  >>  Dose adjustments this admission:   Microbiology results: 7/12 BCx:  7/12 UCx:    Dolphus Roller, PharmD, BCPS 04/12/2024 8:06 AM

## 2024-04-12 NOTE — Plan of Care (Signed)
  Problem: Fluid Volume: Goal: Ability to maintain a balanced intake and output will improve Outcome: Progressing   Problem: Nutritional: Goal: Maintenance of adequate nutrition will improve Outcome: Progressing   Problem: Skin Integrity: Goal: Risk for impaired skin integrity will decrease Outcome: Progressing   Problem: Fluid Volume: Goal: Hemodynamic stability will improve Outcome: Progressing   Problem: Clinical Measurements: Goal: Diagnostic test results will improve Outcome: Progressing Goal: Signs and symptoms of infection will decrease Outcome: Progressing   Problem: Respiratory: Goal: Ability to maintain adequate ventilation will improve Outcome: Not Progressing   Problem: Clinical Measurements: Goal: Ability to maintain clinical measurements within normal limits will improve Outcome: Progressing Goal: Will remain free from infection Outcome: Progressing   Problem: Nutrition: Goal: Adequate nutrition will be maintained Outcome: Progressing   Problem: Elimination: Goal: Will not experience complications related to urinary retention Outcome: Progressing   Problem: Pain Managment: Goal: General experience of comfort will improve and/or be controlled Outcome: Not Progressing

## 2024-04-12 NOTE — Progress Notes (Signed)
 PICC placement approved by Dr Geralynn [facility]ph[facility] via secure chat.

## 2024-04-12 NOTE — Progress Notes (Signed)
 Peripherally Inserted Central Catheter Placement  The IV Nurse has discussed with the patient and/or persons authorized to consent for the patient, the purpose of this procedure and the potential benefits and risks involved with this procedure.  The benefits include less needle sticks, lab draws from the catheter, and the patient may be discharged home with the catheter. Risks include, but not limited to, infection, bleeding, blood clot (thrombus formation), and puncture of an artery; nerve damage and irregular heartbeat and possibility to perform a PICC exchange if needed/ordered by physician.  Alternatives to this procedure were also discussed.  Bard Power PICC patient education guide, fact sheet on infection prevention and patient information card has been provided to patient /or left at bedside.    PICC Placement Documentation  PICC Double Lumen 04/12/24 Right Basilic 44 cm 0 cm (Active)  Indication for Insertion or Continuance of Line Poor Vasculature-patient has had multiple peripheral attempts or PIVs lasting less than 24 hours 04/12/24 1724  Exposed Catheter (cm) 0 cm 04/12/24 1724  Site Assessment Clean, Dry, Intact 04/12/24 1724  Lumen #1 Status Flushed;Saline locked;Blood return noted 04/12/24 1724  Lumen #2 Status Flushed;Saline locked;Blood return noted 04/12/24 1724  Dressing Type Transparent;Securing device 04/12/24 1724  Dressing Status Antimicrobial disc/dressing in place;Clean, Dry, Intact 04/12/24 1724  Line Care Connections checked and tightened 04/12/24 1724  Line Adjustment (NICU/IV Team Only) No 04/12/24 1724  Dressing Intervention New dressing;Adhesive placed at insertion site (IV team only) 04/12/24 1724  Dressing Change Due 04/19/24 04/12/24 1724       Gabriellah Rabel Sheral Ruth 04/12/2024, 5:26 PM

## 2024-04-13 ENCOUNTER — Inpatient Hospital Stay (HOSPITAL_COMMUNITY)

## 2024-04-13 DIAGNOSIS — N179 Acute kidney failure, unspecified: Secondary | ICD-10-CM | POA: Diagnosis not present

## 2024-04-13 DIAGNOSIS — N182 Chronic kidney disease, stage 2 (mild): Secondary | ICD-10-CM | POA: Diagnosis not present

## 2024-04-13 LAB — BASIC METABOLIC PANEL WITH GFR
Anion gap: 9 (ref 5–15)
BUN: 25 mg/dL — ABNORMAL HIGH (ref 6–20)
CO2: 25 mmol/L (ref 22–32)
Calcium: 8 mg/dL — ABNORMAL LOW (ref 8.9–10.3)
Chloride: 98 mmol/L (ref 98–111)
Creatinine, Ser: 1.99 mg/dL — ABNORMAL HIGH (ref 0.61–1.24)
GFR, Estimated: 38 mL/min — ABNORMAL LOW (ref >60)
Glucose, Bld: 221 mg/dL — ABNORMAL HIGH (ref 70–99)
Potassium: 4 mmol/L (ref 3.5–5.1)
Sodium: 132 mmol/L — ABNORMAL LOW (ref 135–145)

## 2024-04-13 LAB — GLUCOSE, CAPILLARY
Glucose-Capillary: 211 mg/dL — ABNORMAL HIGH (ref 70–99)
Glucose-Capillary: 257 mg/dL — ABNORMAL HIGH (ref 70–99)
Glucose-Capillary: 236 mg/dL — ABNORMAL HIGH (ref 70–99)
Glucose-Capillary: 258 mg/dL — ABNORMAL HIGH (ref 70–99)

## 2024-04-13 LAB — CBC
HCT: 31.7 % — ABNORMAL LOW (ref 39.0–52.0)
Hemoglobin: 9.5 g/dL — ABNORMAL LOW (ref 13.0–17.0)
MCH: 24.2 pg — ABNORMAL LOW (ref 26.0–34.0)
MCHC: 30 g/dL (ref 30.0–36.0)
MCV: 80.7 fL (ref 80.0–100.0)
Platelets: 192 K/uL (ref 150–400)
RBC: 3.93 MIL/uL — ABNORMAL LOW (ref 4.22–5.81)
RDW: 18.4 % — ABNORMAL HIGH (ref 11.5–15.5)
WBC: 11.1 K/uL — ABNORMAL HIGH (ref 4.0–10.5)
nRBC: 0 % (ref 0.0–0.2)

## 2024-04-13 LAB — MAGNESIUM: Magnesium: 2.2 mg/dL (ref 1.7–2.4)

## 2024-04-13 LAB — C-REACTIVE PROTEIN: CRP: 25.6 mg/dL — ABNORMAL HIGH (ref <1.0)

## 2024-04-13 MED ORDER — BUDESONIDE 0.5 MG/2ML IN SUSP
0.5000 mg | Freq: Two times a day (BID) | RESPIRATORY_TRACT | Status: DC
  Administered 2024-04-13 – 2024-04-19 (×12): 0.5 mg via RESPIRATORY_TRACT
  Filled 2024-04-13 (×13): qty 2

## 2024-04-13 MED ORDER — ALBUTEROL SULFATE (2.5 MG/3ML) 0.083% IN NEBU: 2.5000 mg | INHALATION_SOLUTION | RESPIRATORY_TRACT | Status: DC | PRN

## 2024-04-13 MED ORDER — OXYCODONE HCL 5 MG PO TABS
5.0000 mg | ORAL_TABLET | ORAL | Status: DC | PRN
  Administered 2024-04-13 – 2024-04-19 (×19): 10 mg via ORAL
  Filled 2024-04-13 (×21): qty 2

## 2024-04-13 MED ORDER — SODIUM CHLORIDE 0.9 % IV SOLN
1.0000 g | Freq: Three times a day (TID) | INTRAVENOUS | Status: AC
  Administered 2024-04-13 – 2024-04-18 (×15): 1 g via INTRAVENOUS
  Filled 2024-04-13 (×16): qty 20

## 2024-04-13 MED ORDER — METHOCARBAMOL 500 MG PO TABS
500.0000 mg | ORAL_TABLET | Freq: Four times a day (QID) | ORAL | Status: DC | PRN
  Administered 2024-04-13 – 2024-04-14 (×3): 500 mg via ORAL
  Filled 2024-04-13 (×3): qty 1

## 2024-04-13 MED ORDER — IPRATROPIUM-ALBUTEROL 0.5-2.5 (3) MG/3ML IN SOLN
3.0000 mL | Freq: Four times a day (QID) | RESPIRATORY_TRACT | Status: DC
  Administered 2024-04-13: 3 mL via RESPIRATORY_TRACT
  Filled 2024-04-13: qty 3

## 2024-04-13 MED ORDER — IPRATROPIUM-ALBUTEROL 0.5-2.5 (3) MG/3ML IN SOLN
3.0000 mL | Freq: Two times a day (BID) | RESPIRATORY_TRACT | Status: DC
  Administered 2024-04-13 – 2024-04-19 (×11): 3 mL via RESPIRATORY_TRACT
  Filled 2024-04-13 (×12): qty 3

## 2024-04-13 MED ORDER — TECHNETIUM TO 99M ALBUMIN AGGREGATED
4.0000 | Freq: Once | INTRAVENOUS | Status: AC
  Administered 2024-04-13: 4.38 via INTRAVENOUS

## 2024-04-13 MED ORDER — LIDOCAINE 5 % EX PTCH
1.0000 | MEDICATED_PATCH | CUTANEOUS | Status: DC
  Administered 2024-04-13 – 2024-04-18 (×6): 1 via TRANSDERMAL
  Filled 2024-04-13 (×7): qty 1

## 2024-04-13 NOTE — Inpatient Diabetes Management (Signed)
 Inpatient Diabetes Program Recommendations  AACE/ADA: New Consensus Statement on Inpatient Glycemic Control (2015)  Target Ranges:  Prepandial:   less than 140 mg/dL      Peak postprandial:   less than 180 mg/dL (1-2 hours)      Critically ill patients:  140 - 180 mg/dL   Lab Results  Component Value Date   GLUCAP 211 (H) 04/13/2024   HGBA1C 8.1 (H) 11/21/2023    Review of Glycemic Control  Latest Reference Range & Units 04/12/24 07:46 04/12/24 11:30 04/12/24 16:08 04/12/24 22:41 04/13/24 07:27  Glucose-Capillary 70 - 99 mg/dL 771 (H) 723 (H) 786 (H) 257 (H) 211 (H)  (H): Data is abnormally high  Diabetes history: DM2  Outpatient Diabetes medications:  Trulicity  0.5 mg weekly Glipizide  10 mg QAM Humalog 2-8 units TID Farxiga  10 mg every day  Current orders for Inpatient glycemic control:  Novolog  0-6 units TID and 0-5 units QHS  Inpatient Diabetes Program Recommendations:    Semglee  10 units QD Novolog  2 units TID with meals if he consumes at least 50%  Thank you, Wyvonna Pinal, MSN, CDCES Diabetes Coordinator Inpatient Diabetes Program [phone] (team pager from 8a-5p)

## 2024-04-13 NOTE — Progress Notes (Signed)
 PROGRESS NOTE    Brett White  FMW:[id number] DOB: 1961-04-26 DOA: 04/11/2024 [facility]CP: [facility]   Brief Narrative:   63 y.o. male with medical history significant for hypertension, hyperlipidemia, insulin -dependent diabetes mellitus, depression, anxiety, insomnia, IBS, chronic pain syndrome, left BKA and recent hospitalization from 03/20/2024-03/24/2024 for ESBL E. coli UTI treated with 7 days of IV antibiotics presented with fatigue, malaise, nausea, vomiting and flank pain.  On presentation, he was hypoxic and required 15 L high flow nasal cannula oxygen.  Creatinine of 2.7, WBC of 23.4, lactic acid of 5.4.  CT of chest, abdomen, pelvis revealed bibasilar atelectasis and nonobstructive right renal calculi, appearing similar to prior.  He was started on IV fluids and antibiotics.  Assessment & Plan:   Acute respiratory failure with hypoxia - Questionable cause.  CT chest unremarkable.  Continue supplemental oxygen: Currently still on 6 L oxygen via nasal cannula.  Wean off as able.  VQ scan pending. -Will start scheduled nebs  Severe sepsis: Present on admission Possible UTI: Present on admission - Presented with leukocytosis, lactic acidosis, acute kidney injury, hypoxia with possible UTI - Blood cultures negative so far.  Currently on broad-spectrum antibiotics: Continue meropenem .  DC vancomycin .  Continue IV fluids.  Influenza/RSV/COVID PCR negative on presentation  AKI on CKD stage II - Creatinine 2.7 on presentation.  Improving to 1.99 today.  Repeat a.m. labs.  Continue IV fluids.  Lactic acidosis: Resolved  Leukocytosis - Improving.  Monitor  Anemia of chronic disease - From chronic illnesses.  Hemoglobin stable.  No signs of bleeding  Hypertension Hyperlipidemia --monitor blood pressure.  Intermittently on the lower side. - Continue statin  Hyponatremia - Mild.  Repeat a.m. labs  Diabetes mellitus type 2 with hyperglycemia - A1c 8.1 in February 2025.   Continue CBGs with SSI.  Carb modified diet  Anxiety and depression Insomnia - Continue sertraline  and trazodone   Chronic pain syndrome with opiate dependence - Continue current pain management regimen.  Outpatient follow-up with PCP and/or pain management   DVT prophylaxis: Heparin  subcutaneous Code Status: Full Family Communication: None at bedside Disposition Plan: Status is: Inpatient Remains inpatient appropriate because: Of severity of illness    Consultants: None  Procedures: None  Antimicrobials:  Anti-infectives (From admission, onward)    Start     Dose/Rate Route Frequency Ordered Stop   04/13/24 1000  vancomycin  (VANCOREADY) IVPB 1500 mg/300 mL  Status:  Discontinued        1,500 mg 150 mL/hr over 120 Minutes Intravenous Every 36 hours 04/11/24 2241 04/12/24 0805   04/12/24 2200  vancomycin  (VANCOREADY) IVPB 1500 mg/300 mL        1,500 mg 150 mL/hr over 120 Minutes Intravenous Every 24 hours 04/12/24 0805     04/12/24 1000  meropenem  (MERREM ) 1 g in sodium chloride  0.9 % 100 mL IVPB        1 g 200 mL/hr over 30 Minutes Intravenous Every 12 hours 04/11/24 2241     04/11/24 2145  vancomycin  (VANCOREADY) IVPB 2000 mg/400 mL        2,000 mg 200 mL/hr over 120 Minutes Intravenous  Once 04/11/24 2135 04/12/24 0000   04/11/24 2145  meropenem  (MERREM ) 1 g in sodium chloride  0.9 % 100 mL IVPB        1 g 200 mL/hr over 30 Minutes Intravenous  Once 04/11/24 2135 04/11/24 2232   04/11/24 2130  vancomycin  (VANCOCIN ) IVPB 1000 mg/200 mL premix  Status:  Discontinued  1,000 mg 200 mL/hr over 60 Minutes Intravenous  Once 04/11/24 2129 04/11/24 2134   04/11/24 1745  cefTRIAXone  (ROCEPHIN ) 2 g in sodium chloride  0.9 % 100 mL IVPB        2 g 200 mL/hr over 30 Minutes Intravenous Once 04/11/24 1731 04/11/24 1901   04/11/24 1745  azithromycin  (ZITHROMAX ) 500 mg in sodium chloride  0.9 % 250 mL IVPB        500 mg 250 mL/hr over 60 Minutes Intravenous  Once 04/11/24 1731  04/11/24 1901        Subjective: Patient seen and examined at bedside.  Denies worsening shortness of breath, fever, vomiting.  Continues to have back and flank pain.  Objective: Vitals:   04/13/24 0300 04/13/24 0400 04/13/24 0500 04/13/24 0600  BP:   125/65 123/70  Pulse: 93 93 90 93  Resp: 13 11 14  (!) 21  Temp:  98.8 F (37.1 C)    TempSrc:  Oral    SpO2: 95% 96% 95% 94%  Weight:      Height:        Intake/Output Summary (Last 24 hours) at 04/13/2024 0734 Last data filed at 04/13/2024 9372 Gross per 24 hour  Intake 1854.2 ml  Output 1150 ml  Net 704.2 ml   Filed Weights   04/11/24 1951  Weight: 120.2 kg    Examination:  General: On room air.  No distress.  Chronically ill and deconditioned ENT/neck: No thyromegaly.  JVD is not elevated  respiratory: Decreased breath sounds at bases bilaterally with some crackles; no wheezing  CVS: S1-S2 heard, rate controlled currently Abdominal: Soft, nontender, slightly distended; no organomegaly, bowel sounds are heard Extremities: Has left BKA  CNS: Awake and alert.  Slow to respond.  No focal neurologic deficit.  Moves extremities Lymph: No obvious lymphadenopathy Skin: No obvious ecchymosis/lesions  psych: Mostly flat affect.  Not agitated currently. musculoskeletal: No obvious other joint swelling/deformity     Data Reviewed: I have personally reviewed following labs and imaging studies  CBC: Recent Labs  Lab 04/11/24 1740 04/12/24 0338 04/13/24 0548  WBC 23.4* 16.9* 11.1*  NEUTROABS 20.9*  --   --   HGB 11.1* 10.4* 9.5*  HCT 36.5* 36.7* 31.7*  MCV 80.9 86.8 80.7  PLT 205 160 192   Basic Metabolic Panel: Recent Labs  Lab 04/11/24 1740 04/12/24 0338 04/13/24 0548  NA 134* 135 132*  K 4.7 4.6 4.0  CL 97* 101 98  CO2 20* 23 25  GLUCOSE 277* 189* 221*  BUN 22* 24* 25*  CREATININE 2.70* 2.20* 1.99*  CALCIUM  8.6* 8.2* 8.0*  MG  --   --  2.2   GFR: Estimated Creatinine Clearance: 54.4 mL/min (A) (by  C-G formula based on SCr of 1.99 mg/dL (H)). Liver Function Tests: Recent Labs  Lab 04/11/24 1740  AST 31  ALT 20  ALKPHOS 90  BILITOT 0.9  PROT 7.0  ALBUMIN  3.0*   No results for input(s): LIPASE, AMYLASE in the last 168 hours. No results for input(s): AMMONIA in the last 168 hours. Coagulation Profile: Recent Labs  Lab 04/11/24 1740  INR 1.2   Cardiac Enzymes: No results for input(s): CKTOTAL, CKMB, CKMBINDEX, TROPONINI in the last 168 hours. BNP (last 3 results) No results for input(s): PROBNP in the last 8760 hours. HbA1C: No results for input(s): HGBA1C in the last 72 hours. CBG: Recent Labs  Lab 04/12/24 0746 04/12/24 1130 04/12/24 1608 04/12/24 2241 04/13/24 0727  GLUCAP 228* 276* 213* 257* 211*  Lipid Profile: No results for input(s): CHOL, HDL, LDLCALC, TRIG, CHOLHDL, LDLDIRECT in the last 72 hours. Thyroid Function Tests: No results for input(s): TSH, T4TOTAL, FREET4, T3FREE, THYROIDAB in the last 72 hours. Anemia Panel: No results for input(s): VITAMINB12, FOLATE, FERRITIN, TIBC, IRON, RETICCTPCT in the last 72 hours. Sepsis Labs: Recent Labs  Lab 04/11/24 1750 04/11/24 2056  LATICACIDVEN 5.4* 1.9    Recent Results (from the past 240 hours)  Blood Culture (routine x 2)     Status: None (Preliminary result)   Collection Time: 04/11/24  5:40 PM   Specimen: BLOOD LEFT FOREARM  Result Value Ref Range Status   Specimen Description   Final    BLOOD LEFT FOREARM Performed at [facility], [address], [place], [place] [address]    Special Requests   Final    BOTTLES DRAWN AEROBIC AND ANAEROBIC Blood Culture results may not be optimal due to an inadequate volume of blood received in culture bottles Performed at [facility], [address] [place], [place] [address]    Culture   Final    NO GROWTH 2 DAYS Performed at [facility], [address]  [place], [place] [address]    Report Status PENDING  Incomplete  Blood Culture (routine x 2)     Status: None (Preliminary result)   Collection Time: 04/11/24  5:46 PM   Specimen: BLOOD LEFT HAND  Result Value Ref Range Status   Specimen Description BLOOD LEFT HAND  Final   Special Requests   Final    BOTTLES DRAWN AEROBIC AND ANAEROBIC Blood Culture adequate volume   Culture   Final    NO GROWTH 2 DAYS Performed at [facility], [address], [place], [place] [address]    Report Status PENDING  Incomplete  Resp panel by RT-PCR (RSV, Flu A&B, Covid) Anterior Nasal Swab     Status: None   Collection Time: 04/11/24  6:50 PM   Specimen: Anterior Nasal Swab  Result Value Ref Range Status   SARS Coronavirus 2 by RT PCR NEGATIVE NEGATIVE Final    Comment: (NOTE) SARS-CoV-2 target nucleic acids are NOT DETECTED.  The SARS-CoV-2 RNA is generally detectable in upper respiratory specimens during the acute phase of infection. The lowest concentration of SARS-CoV-2 viral copies this assay can detect is 138 copies/mL. A negative result does not preclude SARS-Cov-2 infection and should not be used as the sole basis for treatment or other patient management decisions. A negative result may occur with  improper specimen collection/handling, submission of specimen other than nasopharyngeal swab, presence of viral mutation(s) within the areas targeted by this assay, and inadequate number of viral copies(<138 copies/mL). A negative result must be combined with clinical observations, patient history, and epidemiological information. The expected result is Negative.  Fact Sheet for Patients:  BloggerCourse.com  Fact Sheet for Healthcare Providers:  SeriousBroker.it  This test is no t yet approved or cleared by the [place]  FDA and  has been authorized for detection and/or diagnosis of SARS-CoV-2 by FDA under an Emergency Use Authorization  (EUA). This EUA will remain  in effect (meaning this test can be used) for the duration of the COVID-19 declaration under Section 564(b)(1) of the Act, 21 U.S.C.section 360bbb-3(b)(1), unless the authorization is terminated  or revoked sooner.       Influenza A by PCR NEGATIVE NEGATIVE Final   Influenza B by PCR NEGATIVE NEGATIVE Final    Comment: (NOTE) The  Xpert Xpress SARS-CoV-2/FLU/RSV plus assay is intended as an aid in the diagnosis of influenza from Nasopharyngeal swab specimens and should not be used as a sole basis for treatment. Nasal washings and aspirates are unacceptable for Xpert Xpress SARS-CoV-2/FLU/RSV testing.  Fact Sheet for Patients: BloggerCourse.com  Fact Sheet for Healthcare Providers: SeriousBroker.it  This test is not yet approved or cleared by the [place]  FDA and has been authorized for detection and/or diagnosis of SARS-CoV-2 by FDA under an Emergency Use Authorization (EUA). This EUA will remain in effect (meaning this test can be used) for the duration of the COVID-19 declaration under Section 564(b)(1) of the Act, 21 U.S.C. section 360bbb-3(b)(1), unless the authorization is terminated or revoked.     Resp Syncytial Virus by PCR NEGATIVE NEGATIVE Final    Comment: (NOTE) Fact Sheet for Patients: BloggerCourse.com  Fact Sheet for Healthcare Providers: SeriousBroker.it  This test is not yet approved or cleared by the [place]  FDA and has been authorized for detection and/or diagnosis of SARS-CoV-2 by FDA under an Emergency Use Authorization (EUA). This EUA will remain in effect (meaning this test can be used) for the duration of the COVID-19 declaration under Section 564(b)(1) of the Act, 21 U.S.C. section 360bbb-3(b)(1), unless the authorization is terminated or revoked.  Performed at [facility], [address] [place], [place] [address]   MRSA Next Gen by PCR, Nasal     Status: Abnormal   Collection Time: 04/12/24 12:33 AM   Specimen: Nasal Mucosa; Nasal Swab  Result Value Ref Range Status   MRSA by PCR Next Gen DETECTED (A) NOT DETECTED Final    Comment: CRITICAL RESULT CALLED TO, READ BACK BY AND VERIFIED WITH: REXIE MANO RN AT 04/12/2024 0609 BY JEREMY C (NOTE) The GeneXpert MRSA Assay (FDA approved for NASAL specimens only), is one component of a comprehensive MRSA colonization surveillance program. It is not intended to diagnose MRSA infection nor to guide or monitor treatment for MRSA infections. Test performance is not FDA approved in patients less than 84 years old. Performed at [facility], [address], [place], [place] [address]          Radiology Studies: US  EKG SITE RITE Result Date: 04/12/2024 If Site Rite image not attached, placement could not be confirmed due to current cardiac rhythm.  CT CHEST ABDOMEN PELVIS WO CONTRAST Result Date: 04/11/2024 CLINICAL DATA:  Sepsis EXAM: CT CHEST, ABDOMEN AND PELVIS WITHOUT CONTRAST TECHNIQUE: Multidetector CT imaging of the chest, abdomen and pelvis was performed following the standard protocol without IV contrast. RADIATION DOSE REDUCTION: This exam was performed according to the departmental dose-optimization program which includes automated exposure control, adjustment of the mA and/or kV according to patient size and/or use of iterative reconstruction technique. COMPARISON:  Chest x-ray from earlier in the same day, CT from 03/28/2024. FINDINGS: CT CHEST FINDINGS Cardiovascular: Somewhat limited due to lack of IV contrast. Thoracic aorta shows no aneurysmal dilatation. Mild atherosclerotic calcifications are seen. Pulmonary artery is not significantly enlarged. The heart is within normal limits. No significant coronary calcifications are noted. Mediastinum/Nodes: Thoracic inlet is within normal limits. No hilar  or mediastinal adenopathy is noted. The esophagus as visualized is within normal limits. Lungs/Pleura: Mild bibasilar atelectatic changes are seen. No sizable effusion is noted. No parenchymal nodules are seen. Musculoskeletal: No chest wall mass or suspicious bone lesions identified. CT ABDOMEN PELVIS FINDINGS Hepatobiliary: Liver and gallbladder are within normal limits. Pancreas: Unremarkable. No pancreatic ductal dilatation or  surrounding inflammatory changes. Spleen: Normal in size without focal abnormality. Adrenals/Urinary Tract: Adrenal glands are within normal limits. Kidneys demonstrate bilateral perinephric stranding similar to that seen on the prior exam. Simple renal cyst is noted centrally in the left kidney. No follow-up is recommended. Scattered nonobstructing stones are noted on the right the largest of which measures 10 mm. The ureters are within normal limits. No obstructive changes are seen. Hyperdense cyst is again noted in the right kidney inferiorly. No follow-up is recommended. The bladder is partially distended. Stomach/Bowel: No obstructive or inflammatory changes of the colon are noted. The appendix is not visualized consistent with a prior surgical history. Stomach and small bowel are within normal limits. Vascular/Lymphatic: Aortic atherosclerosis. No enlarged abdominal or pelvic lymph nodes. Reproductive: Prostate is unremarkable. Other: Small fat containing umbilical hernia is noted. No abdominopelvic ascites. Musculoskeletal: Postsurgical changes are noted in the lower lumbar spine. IMPRESSION: CT of the chest: Bibasilar atelectasis without sizable effusion. CT of the abdomen and pelvis: Nonobstructing right renal calculi similar to that seen on the prior exam. No obstructive changes are seen. Electronically Signed   By: Oneil Devonshire M.D.   On: 04/11/2024 20:04   DG Chest Port 1 View Result Date: 04/11/2024 CLINICAL DATA:  Nausea, vomiting and diarrhea 3 days. Fell today.  Questionable sepsis. EXAM: PORTABLE CHEST 1 VIEW COMPARISON:  Chest x-ray 03/24/2024 FINDINGS: The cardiac silhouette, mediastinal and hilar contours are within normal limits and stable given the AP projection, portable technique and low lung volumes. Low lung volumes with vascular crowding and bibasilar atelectasis. No definite infiltrates, effusions or pneumothorax. IMPRESSION: Low lung volumes with vascular crowding and bibasilar atelectasis. Electronically Signed   By: MYRTIS Stammer M.D.   On: 04/11/2024 18:26        Scheduled Meds:  atorvastatin   40 mg Oral QHS   brimonidine   1 drop Left Eye BID   Chlorhexidine  Gluconate Cloth  6 each Topical Daily   erythromycin   1 Application Left Eye Q2H   feeding supplement  237 mL Oral BID BM   heparin   5,000 Units Subcutaneous Q8H   insulin  aspart  0-5 Units Subcutaneous QHS   insulin  aspart  0-6 Units Subcutaneous TID WC   mupirocin  ointment  1 Application Nasal BID   sertraline   50 mg Oral Daily   sodium chloride  flush  10-40 mL Intracatheter Q12H   sodium chloride  flush  3 mL Intravenous Q12H   traZODone   75 mg Oral QHS   Continuous Infusions:  sodium chloride  100 mL/hr at 04/13/24 0627   meropenem  (MERREM ) IV Stopped (04/12/24 2248)   vancomycin  Stopped (04/12/24 2227)          Sophie Mao, MD [facility] 04/13/2024, 7:34 AM

## 2024-04-13 NOTE — TOC Initial Note (Addendum)
 Transition of Care [facility]) - Initial/Assessment Note    Patient Details  Name: Brett White MRN: [id number] Date of Birth: 24-Dec-1960  Transition of Care [facility]) CM/SW Contact:    Jon ONEIDA Anon, RN Phone Number: 04/13/2024, 3:15 PM  Clinical Narrative:                 Pt is from [facility] as a long term care resident. NCM met with pt at bedside and he states he has been living at [facility] for the past 2 years. States he uses a walker and wheelchair for ambulation at baseline. Pt states he does not wish for me to call anyone as he does not have any family locally. NCM spoke with Myrene, Admissions Coordinator at [facility] and she confirms pt is a long-term resident and will have his bed once ready to discharge. Pt will need PTAR transport at DC. TOC will continue to follow.  Expected Discharge Plan: Long Term Nursing Home Barriers to Discharge: Continued Medical Work up   Patient Goals and CMS Choice Patient states their goals for this hospitalization and ongoing recovery are:: To return back to [facility] CMS Medicare.gov Compare Post Acute Care list provided to:: Other (Comment Required) (NA) Choice offered to / list presented to : NA [place] ownership interest in [facility].provided to:: Parent NA    Expected Discharge Plan and Services In-house Referral: Clinical Social Work Discharge Planning Services: CM Consult Post Acute Care Choice: Nursing Home Living arrangements for the past 2 months: Skilled Nursing Facility                 DME Arranged: N/A DME Agency: NA       HH Arranged: NA HH Agency: NA        Prior Living Arrangements/Services Living arrangements for the past 2 months: Skilled Nursing Facility Lives with:: Facility Resident Patient language and need for interpreter reviewed:: Yes Do you feel safe going back to the place where you live?: Yes      Need for Family Participation in Patient Care: No  (Comment) Care giver support system in place?: Yes (comment) (Pt currently resides in LTC Facility) Current home services: DME, Other (comment) Furniture conservator/restorer, Environmental consultant) Criminal Activity/Legal Involvement Pertinent to Current Situation/Hospitalization: No - Comment as needed  Activities of Daily Living   ADL Screening (condition at time of admission) Independently performs ADLs?: Yes (appropriate for developmental age) Is the patient deaf or have difficulty hearing?: No Does the patient have difficulty seeing, even when wearing glasses/contacts?: No Does the patient have difficulty concentrating, remembering, or making decisions?: No  Permission Sought/Granted Permission sought to share information with : Facility Medical sales representative, Family Supports Permission granted to share information with : Yes, Verbal Permission Granted  Share Information with NAME: Monteith,Michelle (Relative)  [phone]  Permission granted to share info w AGENCY: [facility]        Emotional Assessment Appearance:: Appears stated age Attitude/Demeanor/Rapport: Engaged Affect (typically observed): Appropriate Orientation: : Oriented to Self, Oriented to Place, Oriented to  Time, Oriented to Situation Alcohol  / Substance Use: Not Applicable Psych Involvement: No (comment)  Admission diagnosis:  Lactic acidosis [E87.20] AKI (acute kidney injury) (HCC) [N17.9] Septic shock (HCC) [A41.9, R65.21] Acute renal failure superimposed on stage 2 chronic kidney disease (HCC) [N17.9, N18.2] Patient Active Problem List   Diagnosis Date Noted   Acute renal failure superimposed on stage 2 chronic kidney disease (HCC) 04/11/2024   SIRS (systemic inflammatory response  syndrome) (HCC) 04/11/2024   Acute cystitis 03/20/2024   Hyperkalemia 03/20/2024   Generalized anxiety disorder 03/20/2024   Chronic pain syndrome 03/20/2024   History of abdominal aortic aneurysm (AAA) 03/20/2024   HLD (hyperlipidemia)  03/20/2024   Narcotic dependence (HCC) 03/20/2024   Peripheral neuropathy 03/20/2024   Reactive airway disease 03/20/2024   Insomnia 03/20/2024   Unspecified open wound, right foot, initial encounter 05/14/2023   Pneumonia 11/16/2022   Acute respiratory failure with hypoxia (HCC) 11/16/2022   Sepsis with acute organ dysfunction (HCC) 11/16/2022   Aspiration pneumonia (HCC) 11/16/2022   GERD without esophagitis 06/11/2022   Mixed diabetic hyperlipidemia associated with type 2 diabetes mellitus (HCC) 06/11/2022   Adjustment disorder with depressed mood 05/23/2022   Chronic hyponatremia 12/09/2021   Uncontrolled type 2 diabetes mellitus with hyperglycemia, without long-term current use of insulin  (HCC) 12/09/2021   Tachycardia 12/09/2021   Acute kidney injury superimposed on chronic kidney disease (HCC) 10/04/2021   S/P BKA (below knee amputation) unilateral, left (HCC) 04/05/2021   Insulin  dependent type 2 diabetes mellitus (HCC) 02/03/2021   Essential hypertension 02/03/2021   Diabetic polyneuropathy associated with type 2 diabetes mellitus (HCC) 02/03/2021   PCP:  [facility]:   [id number] [place], [place] - [address] AT [facility] OF [address] [place] [address] Phone: [phone] Fax: [phone]  [facility] - [place], [place] - [address] [place] [address] Phone: [phone] Fax: [phone]     Social Drivers of Health (SDOH) Social History: SDOH Screenings   Food Insecurity: No Food Insecurity (04/12/2024)  Housing: Low Risk  (04/12/2024)  Transportation Needs: No Transportation Needs (04/12/2024)  Utilities: Not At Risk (04/12/2024)  Tobacco Use: Low Risk  (04/11/2024)   SDOH Interventions:     Readmission Risk Interventions    04/13/2024    3:04 PM 11/16/2022    2:57 PM  Readmission Risk Prevention Plan   Transportation Screening Complete Complete  Medication Review (RN Care Manager) Complete Complete  PCP or Specialist appointment within 3-5 days of discharge Complete Complete  HRI or Home Care Consult Complete Complete  SW Recovery Care/Counseling Consult Complete Complete  Palliative Care Screening Complete Complete  Skilled Nursing Facility Complete Complete

## 2024-04-13 NOTE — Progress Notes (Signed)
 PT Cancellation Note  Patient Details Name: Brett White MRN: [id number] DOB: 1961-06-20   Cancelled Treatment:    Reason Eval/Treat Not Completed: Pain limiting ability to participate;Patient at procedure or test/unavailable In AM, patient at procedure, In PM having pain, now resting .  Will check back tomorrow. Darice Potters PT Acute Rehabilitation Services Office [phone]   Potters Darice Norris 04/13/2024, 3:51 PM

## 2024-04-13 NOTE — Progress Notes (Signed)
 PHARMACY NOTE:  ANTIMICROBIAL RENAL DOSAGE ADJUSTMENT  Current antimicrobial regimen includes a mismatch between antimicrobial dosage and estimated renal function.  As per policy approved by the Pharmacy & Therapeutics and Medical Executive Committees, the antimicrobial dosage will be adjusted accordingly.  Current antimicrobial dosage: Meropenem  1g IV q12h  Indication: Sepsis, UTI with history of recent ESBL Ecoli UTI 03/15/24  Renal Function: SCr decreasing to 1.99 from peak 2.7 Estimated Creatinine Clearance: 54.4 mL/min (A) (by C-G formula based on SCr of 1.99 mg/dL (H)).    Antimicrobial dosage has been changed to:  Meropenem  1g IV q8h  Thank you for allowing pharmacy to be a part of this patient's care.  Wanda Hasting PharmD, BCPS [facility] main pharmacy [phone] 04/13/2024 8:32 AM

## 2024-04-13 NOTE — Plan of Care (Signed)
  Problem: Education: Goal: Ability to describe self-care measures that may prevent or decrease complications (Diabetes Survival Skills Education) will improve Outcome: Not Progressing Goal: Individualized Educational Video(s) Outcome: Not Progressing   Problem: Coping: Goal: Ability to adjust to condition or change in health will improve Outcome: Not Progressing   Problem: Fluid Volume: Goal: Ability to maintain a balanced intake and output will improve Outcome: Not Progressing   Problem: Health Behavior/Discharge Planning: Goal: Ability to identify and utilize available resources and services will improve Outcome: Not Progressing Goal: Ability to manage health-related needs will improve Outcome: Not Progressing   Problem: Metabolic: Goal: Ability to maintain appropriate glucose levels will improve Outcome: Not Progressing   Problem: Nutritional: Goal: Maintenance of adequate nutrition will improve Outcome: Not Progressing Goal: Progress toward achieving an optimal weight will improve Outcome: Not Progressing   Problem: Skin Integrity: Goal: Risk for impaired skin integrity will decrease Outcome: Not Progressing   Problem: Tissue Perfusion: Goal: Adequacy of tissue perfusion will improve Outcome: Not Progressing   Problem: Fluid Volume: Goal: Hemodynamic stability will improve Outcome: Not Progressing   Problem: Clinical Measurements: Goal: Diagnostic test results will improve Outcome: Not Progressing Goal: Signs and symptoms of infection will decrease Outcome: Not Progressing   Problem: Respiratory: Goal: Ability to maintain adequate ventilation will improve Outcome: Not Progressing   Problem: Education: Goal: Knowledge of General Education information will improve Description: Including pain rating scale, medication(s)/side effects and non-pharmacologic comfort measures Outcome: Not Progressing   Problem: Health Behavior/Discharge Planning: Goal: Ability  to manage health-related needs will improve Outcome: Not Progressing   Problem: Clinical Measurements: Goal: Ability to maintain clinical measurements within normal limits will improve Outcome: Not Progressing Goal: Will remain free from infection Outcome: Not Progressing Goal: Diagnostic test results will improve Outcome: Not Progressing Goal: Respiratory complications will improve Outcome: Not Progressing Goal: Cardiovascular complication will be avoided Outcome: Not Progressing   Problem: Activity: Goal: Risk for activity intolerance will decrease Outcome: Not Progressing   Problem: Nutrition: Goal: Adequate nutrition will be maintained Outcome: Not Progressing   Problem: Coping: Goal: Level of anxiety will decrease Outcome: Not Progressing   Problem: Elimination: Goal: Will not experience complications related to bowel motility Outcome: Not Progressing Goal: Will not experience complications related to urinary retention Outcome: Not Progressing   Problem: Pain Managment: Goal: General experience of comfort will improve and/or be controlled Outcome: Not Progressing   Problem: Safety: Goal: Ability to remain free from injury will improve Outcome: Not Progressing   Problem: Skin Integrity: Goal: Risk for impaired skin integrity will decrease Outcome: Not Progressing

## 2024-04-14 ENCOUNTER — Inpatient Hospital Stay (HOSPITAL_COMMUNITY)

## 2024-04-14 DIAGNOSIS — I5031 Acute diastolic (congestive) heart failure: Secondary | ICD-10-CM | POA: Diagnosis not present

## 2024-04-14 DIAGNOSIS — Z1152 Encounter for screening for COVID-19: Secondary | ICD-10-CM | POA: Diagnosis not present

## 2024-04-14 DIAGNOSIS — N179 Acute kidney failure, unspecified: Secondary | ICD-10-CM | POA: Diagnosis not present

## 2024-04-14 DIAGNOSIS — A4151 Sepsis due to Escherichia coli [E. coli]: Secondary | ICD-10-CM | POA: Diagnosis not present

## 2024-04-14 DIAGNOSIS — N182 Chronic kidney disease, stage 2 (mild): Secondary | ICD-10-CM | POA: Diagnosis not present

## 2024-04-14 DIAGNOSIS — J9601 Acute respiratory failure with hypoxia: Secondary | ICD-10-CM | POA: Diagnosis not present

## 2024-04-14 LAB — GLUCOSE, CAPILLARY
Glucose-Capillary: 202 mg/dL — ABNORMAL HIGH (ref 70–99)
Glucose-Capillary: 284 mg/dL — ABNORMAL HIGH (ref 70–99)
Glucose-Capillary: 247 mg/dL — ABNORMAL HIGH (ref 70–99)
Glucose-Capillary: 279 mg/dL — ABNORMAL HIGH (ref 70–99)

## 2024-04-14 LAB — CBC
HCT: 29.7 % — ABNORMAL LOW (ref 39.0–52.0)
Hemoglobin: 8.9 g/dL — ABNORMAL LOW (ref 13.0–17.0)
MCH: 24.2 pg — ABNORMAL LOW (ref 26.0–34.0)
MCHC: 30 g/dL (ref 30.0–36.0)
MCV: 80.7 fL (ref 80.0–100.0)
Platelets: 179 K/uL (ref 150–400)
RBC: 3.68 MIL/uL — ABNORMAL LOW (ref 4.22–5.81)
RDW: 18.4 % — ABNORMAL HIGH (ref 11.5–15.5)
WBC: 6.8 K/uL (ref 4.0–10.5)
nRBC: 0 % (ref 0.0–0.2)

## 2024-04-14 LAB — URINE CULTURE: Culture: 80000 — AB

## 2024-04-14 LAB — BASIC METABOLIC PANEL WITH GFR
Anion gap: 4 — ABNORMAL LOW (ref 5–15)
BUN: 20 mg/dL (ref 6–20)
CO2: 28 mmol/L (ref 22–32)
Calcium: 7.8 mg/dL — ABNORMAL LOW (ref 8.9–10.3)
Chloride: 101 mmol/L (ref 98–111)
Creatinine, Ser: 1.28 mg/dL — ABNORMAL HIGH (ref 0.61–1.24)
GFR, Estimated: >60 mL/min (ref >60)
Glucose, Bld: 223 mg/dL — ABNORMAL HIGH (ref 70–99)
Potassium: 4.6 mmol/L (ref 3.5–5.1)
Sodium: 133 mmol/L — ABNORMAL LOW (ref 135–145)

## 2024-04-14 LAB — TROPONIN I (HIGH SENSITIVITY)
Troponin I (High Sensitivity): 11 ng/L (ref <18)
Troponin I (High Sensitivity): 12 ng/L (ref <18)

## 2024-04-14 LAB — MAGNESIUM: Magnesium: 1.9 mg/dL (ref 1.7–2.4)

## 2024-04-14 MED ORDER — PREGABALIN 50 MG PO CAPS
100.0000 mg | ORAL_CAPSULE | Freq: Two times a day (BID) | ORAL | Status: DC
Start: 2024-04-14 — End: 2024-04-19
  Administered 2024-04-14 – 2024-04-19 (×11): 100 mg via ORAL
  Filled 2024-04-14 (×3): qty 2
  Filled 2024-04-14: qty 1
  Filled 2024-04-14: qty 2
  Filled 2024-04-14: qty 1
  Filled 2024-04-14: qty 2
  Filled 2024-04-14: qty 1
  Filled 2024-04-14: qty 2
  Filled 2024-04-14 (×2): qty 1

## 2024-04-14 MED ORDER — HYDRALAZINE HCL 25 MG PO TABS
25.0000 mg | ORAL_TABLET | Freq: Four times a day (QID) | ORAL | Status: DC | PRN
  Filled 2024-04-14: qty 1

## 2024-04-14 MED ORDER — FUROSEMIDE 10 MG/ML IJ SOLN
80.0000 mg | Freq: Once | INTRAMUSCULAR | Status: AC
  Administered 2024-04-14: 80 mg via INTRAVENOUS
  Filled 2024-04-14: qty 8

## 2024-04-14 MED ORDER — HYDROMORPHONE HCL 2 MG PO TABS
4.0000 mg | ORAL_TABLET | Freq: Four times a day (QID) | ORAL | Status: DC | PRN
  Administered 2024-04-14 – 2024-04-19 (×15): 4 mg via ORAL
  Filled 2024-04-14 (×15): qty 2

## 2024-04-14 MED ORDER — NITROGLYCERIN 0.4 MG SL SUBL: 0.4000 mg | SUBLINGUAL_TABLET | SUBLINGUAL | Status: DC | PRN

## 2024-04-14 MED ORDER — DICLOFENAC SODIUM 1 % EX GEL
4.0000 g | Freq: Four times a day (QID) | CUTANEOUS | Status: DC
  Administered 2024-04-14 – 2024-04-19 (×13): 4 g via TOPICAL
  Filled 2024-04-14: qty 100

## 2024-04-14 MED ORDER — TAMSULOSIN HCL 0.4 MG PO CAPS
0.4000 mg | ORAL_CAPSULE | Freq: Every day | ORAL | Status: DC
  Administered 2024-04-14 – 2024-04-18 (×5): 0.4 mg via ORAL
  Filled 2024-04-14 (×5): qty 1

## 2024-04-14 MED ORDER — METHOCARBAMOL 500 MG PO TABS
750.0000 mg | ORAL_TABLET | Freq: Four times a day (QID) | ORAL | Status: DC | PRN
  Administered 2024-04-14 – 2024-04-18 (×9): 750 mg via ORAL
  Filled 2024-04-14 (×9): qty 2

## 2024-04-14 MED ORDER — AMLODIPINE BESYLATE 10 MG PO TABS
10.0000 mg | ORAL_TABLET | Freq: Every day | ORAL | Status: DC
  Administered 2024-04-14 – 2024-04-19 (×6): 10 mg via ORAL
  Filled 2024-04-14 (×6): qty 1

## 2024-04-14 MED ORDER — DULOXETINE HCL 30 MG PO CPEP
60.0000 mg | ORAL_CAPSULE | Freq: Every morning | ORAL | Status: DC
  Administered 2024-04-14 – 2024-04-19 (×6): 60 mg via ORAL
  Filled 2024-04-14 (×6): qty 2

## 2024-04-14 NOTE — Inpatient Diabetes Management (Signed)
 Inpatient Diabetes Program Recommendations  AACE/ADA: New Consensus Statement on Inpatient Glycemic Control (2015)  Target Ranges:  Prepandial:   less than 140 mg/dL      Peak postprandial:   less than 180 mg/dL (1-2 hours)      Critically ill patients:  140 - 180 mg/dL   Lab Results  Component Value Date   GLUCAP 202 (H) 04/14/2024   HGBA1C 8.1 (H) 11/21/2023    Review of Glycemic Control  Latest Reference Range & Units 04/13/24 07:27 04/13/24 12:14 04/13/24 16:19 04/13/24 21:16 04/14/24 07:55  Glucose-Capillary 70 - 99 mg/dL 788 (H) 742 (H) 763 (H) 258 (H) 202 (H)  (H): Data is abnormally high  Diabetes history: DM2   Outpatient Diabetes medications:  Trulicity  0.5 mg weekly Glipizide  10 mg QAM Humalog 2-8 units TID Farxiga  10 mg every day   Current orders for Inpatient glycemic control:  Novolog  0-6 units TID and 0-5 units QHS   Inpatient Diabetes Program Recommendations:     Semglee  10 units QD Novolog  2 units TID with meals if he consumes at least 50%   Thank you, Wyvonna Pinal, MSN, CDCES Diabetes Coordinator Inpatient Diabetes Program [phone] (team pager from 8a-5p)

## 2024-04-14 NOTE — Plan of Care (Signed)
   Problem: Education: Goal: Ability to describe self-care measures that may prevent or decrease complications (Diabetes Survival Skills Education) will improve Outcome: Progressing   Problem: Education: Goal: Individualized Educational Video(s) Outcome: Progressing   Problem: Coping: Goal: Ability to adjust to condition or change in health will improve Outcome: Progressing

## 2024-04-14 NOTE — Progress Notes (Signed)
 PT Cancellation Note  Patient Details Name: Brett White MRN: [id number] DOB: 01/01/1961   Cancelled Treatment:    Reason Eval/Treat Not Completed: Patient declined, no reason specified  Patient reports that he is not feeling up to getting OOB.  Will check back tomorrow per pt. Request.  Darice Pot[place] PT Acute Rehabilitation Services Office [phone]  [place] Darice Norris 04/14/2024, 12:53 PM

## 2024-04-14 NOTE — Progress Notes (Signed)
 PROGRESS NOTE    Annette Liotta  FMW:[id number] DOB: 08-24-1961 DOA: 04/11/2024 PCP: [facility]   Brief Narrative:   63 y.o. male with medical history significant for hypertension, hyperlipidemia, insulin -dependent diabetes mellitus, depression, anxiety, insomnia, IBS, chronic pain syndrome, left BKA and recent hospitalization from 03/20/2024-03/24/2024 for ESBL E. coli UTI treated with 7 days of IV antibiotics presented with fatigue, malaise, nausea, vomiting and flank pain.  On presentation, he was hypoxic and required 15 L high flow nasal cannula oxygen.  Creatinine of 2.7, WBC of 23.4, lactic acid of 5.4.  CT of chest, abdomen, pelvis revealed bibasilar atelectasis and nonobstructive right renal calculi, appearing similar to prior.  He was started on IV fluids and antibiotics.  Assessment & Plan:   Acute respiratory failure with hypoxia - Questionable cause.  CT chest unremarkable.  Continue supplemental oxygen: Currently on 6 L oxygen via nasal cannula.  Wean off as able.  VQ scan showed no evidence of PE. - Continue scheduled nebs  Severe sepsis: Present on admission Possible UTI: Present on admission - Presented with leukocytosis, lactic acidosis, acute kidney injury, hypoxia with possible UTI - Blood cultures negative so far.  Urine culture growing E. coli.  Continue meropenem .  Off vancomycin .  Hemodynamically improving.  Influenza/RSV/COVID PCR negative on presentation  AKI on CKD stage II - Creatinine 2.7 on presentation.  Improving to 1.99 on 04/13/2024.  Pending today.  Repeat a.m. labs.  Currently on IV fluids.  Lactic acidosis: Resolved  Leukocytosis - Improved currently  Anemia of chronic disease - From chronic illnesses.  Hemoglobin stable.  No signs of bleeding  Hypertension Hyperlipidemia --monitor blood pressure.  Intermittently on the lower side. - Continue statin  Hyponatremia - Mild.  Monitor. Repeat a.m. labs  Diabetes mellitus type 2 with  hyperglycemia - A1c 8.1 in February 2025.  Continue CBGs with SSI.  Carb modified diet  Anxiety and depression Insomnia - Continue sertraline  and trazodone   Chronic pain syndrome with opiate dependence - Continue current pain management regimen.  Outpatient follow-up with PCP and/or pain management   DVT prophylaxis: Heparin  subcutaneous Code Status: Full Family Communication: None at bedside Disposition Plan: Status is: Inpatient Remains inpatient appropriate because: Of severity of illness    Consultants: None  Procedures: None  Antimicrobials:  Anti-infectives (From admission, onward)    Start     Dose/Rate Route Frequency Ordered Stop   04/13/24 1800  meropenem  (MERREM ) 1 g in sodium chloride  0.9 % 100 mL IVPB        1 g 200 mL/hr over 30 Minutes Intravenous Every 8 hours 04/13/24 1126     04/13/24 1000  vancomycin  (VANCOREADY) IVPB 1500 mg/300 mL  Status:  Discontinued        1,500 mg 150 mL/hr over 120 Minutes Intravenous Every 36 hours 04/11/24 2241 04/12/24 0805   04/12/24 2200  vancomycin  (VANCOREADY) IVPB 1500 mg/300 mL  Status:  Discontinued        1,500 mg 150 mL/hr over 120 Minutes Intravenous Every 24 hours 04/12/24 0805 04/13/24 0737   04/12/24 1000  meropenem  (MERREM ) 1 g in sodium chloride  0.9 % 100 mL IVPB  Status:  Discontinued        1 g 200 mL/hr over 30 Minutes Intravenous Every 12 hours 04/11/24 2241 04/13/24 1126   04/11/24 2145  vancomycin  (VANCOREADY) IVPB 2000 mg/400 mL        2,000 mg 200 mL/hr over 120 Minutes Intravenous  Once 04/11/24 2135 04/12/24 0000  04/11/24 2145  meropenem  (MERREM ) 1 g in sodium chloride  0.9 % 100 mL IVPB        1 g 200 mL/hr over 30 Minutes Intravenous  Once 04/11/24 2135 04/11/24 2232   04/11/24 2130  vancomycin  (VANCOCIN ) IVPB 1000 mg/200 mL premix  Status:  Discontinued        1,000 mg 200 mL/hr over 60 Minutes Intravenous  Once 04/11/24 2129 04/11/24 2134   04/11/24 1745  cefTRIAXone  (ROCEPHIN ) 2 g in sodium  chloride 0.9 % 100 mL IVPB        2 g 200 mL/hr over 30 Minutes Intravenous Once 04/11/24 1731 04/11/24 1901   04/11/24 1745  azithromycin  (ZITHROMAX ) 500 mg in sodium chloride  0.9 % 250 mL IVPB        500 mg 250 mL/hr over 60 Minutes Intravenous  Once 04/11/24 1731 04/11/24 1901        Subjective: Patient seen and examined at bedside.  Still short of breath with minimal exertion.  Does not feel well.  Feels that he is hurting all over.  No fever or vomiting reported. Objective: Vitals:   04/14/24 0400 04/14/24 0500 04/14/24 0600 04/14/24 0700  BP: (!) 147/75 115/69 132/77 126/81  Pulse: 89 79 78 79  Resp: 15 (!) 8 (!) 9 11  Temp:      TempSrc:      SpO2: 92% 93% 93% 95%  Weight:  125.5 kg    Height:        Intake/Output Summary (Last 24 hours) at 04/14/2024 0730 Last data filed at 04/14/2024 0520 Gross per 24 hour  Intake 3174.88 ml  Output 2550 ml  Net 624.88 ml   Filed Weights   04/11/24 1951 04/13/24 0718 04/14/24 0500  Weight: 120.2 kg 125.5 kg 125.5 kg    Examination:  General: No acute distress.  Remains on 5 L oxygen via nasal cannula.  Chronically ill and deconditioned  ENT/neck: No JVD elevation or palpable neck masses noted respiratory: Bilateral decreased breath sounds at bases with scattered crackles CVS: Rate mostly controlled; S1 and S2 are heard  abdominal: Soft, nontender, distended mildly; no organomegaly, bowel sounds are heard normally Extremities: Left BKA present CNS: Alert and awake.  Still slow to respond.  No focal neurologic deficit.  Able to move extremities Lymph: No obvious palpable lymphadenopathy Skin: No obvious petechiae/rashes  psych: Currently not agitated.  Flat affect.   Musculoskeletal: No obvious other joint tenderness/erythema     Data Reviewed: I have personally reviewed following labs and imaging studies  CBC: Recent Labs  Lab 04/11/24 1740 04/12/24 0338 04/13/24 0548 04/14/24 0719  WBC 23.4* 16.9* 11.1* 6.8   NEUTROABS 20.9*  --   --   --   HGB 11.1* 10.4* 9.5* 8.9*  HCT 36.5* 36.7* 31.7* 29.7*  MCV 80.9 86.8 80.7 80.7  PLT 205 160 192 179   Basic Metabolic Panel: Recent Labs  Lab 04/11/24 1740 04/12/24 0338 04/13/24 0548  NA 134* 135 132*  K 4.7 4.6 4.0  CL 97* 101 98  CO2 20* 23 25  GLUCOSE 277* 189* 221*  BUN 22* 24* 25*  CREATININE 2.70* 2.20* 1.99*  CALCIUM  8.6* 8.2* 8.0*  MG  --   --  2.2   GFR: Estimated Creatinine Clearance: 55.6 mL/min (A) (by C-G formula based on SCr of 1.99 mg/dL (H)). Liver Function Tests: Recent Labs  Lab 04/11/24 1740  AST 31  ALT 20  ALKPHOS 90  BILITOT 0.9  PROT 7.0  ALBUMIN  3.0*   No results for input(s): LIPASE, AMYLASE in the last 168 hours. No results for input(s): AMMONIA in the last 168 hours. Coagulation Profile: Recent Labs  Lab 04/11/24 1740  INR 1.2   Cardiac Enzymes: No results for input(s): CKTOTAL, CKMB, CKMBINDEX, TROPONINI in the last 168 hours. BNP (last 3 results) No results for input(s): PROBNP in the last 8760 hours. HbA1C: No results for input(s): HGBA1C in the last 72 hours. CBG: Recent Labs  Lab 04/12/24 2241 04/13/24 0727 04/13/24 1214 04/13/24 1619 04/13/24 2116  GLUCAP 257* 211* 257* 236* 258*   Lipid Profile: No results for input(s): CHOL, HDL, LDLCALC, TRIG, CHOLHDL, LDLDIRECT in the last 72 hours. Thyroid Function Tests: No results for input(s): TSH, T4TOTAL, FREET4, T3FREE, THYROIDAB in the last 72 hours. Anemia Panel: No results for input(s): VITAMINB12, FOLATE, FERRITIN, TIBC, IRON, RETICCTPCT in the last 72 hours. Sepsis Labs: Recent Labs  Lab 04/11/24 1750 04/11/24 2056  LATICACIDVEN 5.4* 1.9    Recent Results (from the past 240 hours)  Urine Culture     Status: Abnormal (Preliminary result)   Collection Time: 04/11/24  3:31 PM   Specimen: Urine, Random  Result Value Ref Range Status   Specimen Description   Final    URINE,  RANDOM Performed at [facility], [address] [place], [place] [address]    Special Requests   Final    NONE Reflexed from [id number] Performed at [facility], [address] [place], [place] [address]    Culture (A)  Final    80,000 COLONIES/mL ESCHERICHIA COLI SUSCEPTIBILITIES TO FOLLOW Performed at [facility], [address] [place], [place] [address]    Report Status PENDING  Incomplete  Blood Culture (routine x 2)     Status: None (Preliminary result)   Collection Time: 04/11/24  5:40 PM   Specimen: BLOOD LEFT FOREARM  Result Value Ref Range Status   Specimen Description   Final    BLOOD LEFT FOREARM Performed at [facility], [address], [place], [place] [address]    Special Requests   Final    BOTTLES DRAWN AEROBIC AND ANAEROBIC Blood Culture results may not be optimal due to an inadequate volume of blood received in culture bottles Performed at [facility], [address] [place], [place] [address]    Culture   Final    NO GROWTH 3 DAYS Performed at [facility], [address] [place], [place] [address]    Report Status PENDING  Incomplete  Blood Culture (routine x 2)     Status: None (Preliminary result)   Collection Time: 04/11/24  5:46 PM   Specimen: BLOOD LEFT HAND  Result Value Ref Range Status   Specimen Description BLOOD LEFT HAND  Final   Special Requests   Final    BOTTLES DRAWN AEROBIC AND ANAEROBIC Blood Culture adequate volume   Culture   Final    NO GROWTH 3 DAYS Performed at [facility], [address] [place], [place] [address]    Report Status PENDING  Incomplete  Resp panel by RT-PCR (RSV, Flu A&B, Covid) Anterior Nasal Swab     Status: None   Collection Time: 04/11/24  6:50 PM   Specimen: Anterior Nasal Swab  Result Value Ref Range Status   SARS Coronavirus 2 by RT PCR NEGATIVE NEGATIVE Final    Comment: (NOTE) SARS-CoV-2 target nucleic acids are  NOT DETECTED.  The SARS-CoV-2 RNA is generally detectable in upper respiratory specimens during the acute phase of infection. The lowest concentration of SARS-CoV-2 viral copies this assay can detect is 138 copies/mL. A negative result does not preclude SARS-Cov-2 infection and should not be used as the sole basis for treatment or other patient management decisions. A negative result may occur with  improper specimen collection/handling, submission of specimen other than nasopharyngeal swab, presence of viral mutation(s) within the areas targeted by this assay, and inadequate number of viral copies(<138 copies/mL). A negative result must be combined with clinical observations, patient history, and epidemiological information. The expected result is Negative.  Fact Sheet for Patients:  BloggerCourse.com  Fact Sheet for Healthcare Providers:  SeriousBroker.it  This test is no t yet approved or cleared by the [place]  FDA and  has been authorized for detection and/or diagnosis of SARS-CoV-2 by FDA under an Emergency Use Authorization (EUA). This EUA will remain  in effect (meaning this test can be used) for the duration of the COVID-19 declaration under Section 564(b)(1) of the Act, 21 U.S.C.section 360bbb-3(b)(1), unless the authorization is terminated  or revoked sooner.       Influenza A by PCR NEGATIVE NEGATIVE Final   Influenza B by PCR NEGATIVE NEGATIVE Final    Comment: (NOTE) The Xpert Xpress SARS-CoV-2/FLU/RSV plus assay is intended as an aid in the diagnosis of influenza from Nasopharyngeal swab specimens and should not be used as a sole basis for treatment. Nasal washings and aspirates are unacceptable for Xpert Xpress SARS-CoV-2/FLU/RSV testing.  Fact Sheet for Patients: BloggerCourse.com  Fact Sheet for Healthcare Providers: SeriousBroker.it  This test is not  yet approved or cleared by the [place]  FDA and has been authorized for detection and/or diagnosis of SARS-CoV-2 by FDA under an Emergency Use Authorization (EUA). This EUA will remain in effect (meaning this test can be used) for the duration of the COVID-19 declaration under Section 564(b)(1) of the Act, 21 U.S.C. section 360bbb-3(b)(1), unless the authorization is terminated or revoked.     Resp Syncytial Virus by PCR NEGATIVE NEGATIVE Final    Comment: (NOTE) Fact Sheet for Patients: BloggerCourse.com  Fact Sheet for Healthcare Providers: SeriousBroker.it  This test is not yet approved or cleared by the [place]  FDA and has been authorized for detection and/or diagnosis of SARS-CoV-2 by FDA under an Emergency Use Authorization (EUA). This EUA will remain in effect (meaning this test can be used) for the duration of the COVID-19 declaration under Section 564(b)(1) of the Act, 21 U.S.C. section 360bbb-3(b)(1), unless the authorization is terminated or revoked.  Performed at [facility], [address], [place], [place] [address]   MRSA Next Gen by PCR, Nasal     Status: Abnormal   Collection Time: 04/12/24 12:33 AM   Specimen: Nasal Mucosa; Nasal Swab  Result Value Ref Range Status   MRSA by PCR Next Gen DETECTED (A) NOT DETECTED Final    Comment: CRITICAL RESULT CALLED TO, READ BACK BY AND VERIFIED WITH: REXIE MANO RN AT 04/12/2024 0609 BY JEREMY C (NOTE) The GeneXpert MRSA Assay (FDA approved for NASAL specimens only), is one component of a comprehensive MRSA colonization surveillance program. It is not intended to diagnose MRSA infection nor to guide or monitor treatment for MRSA infections. Test performance is not FDA approved in patients less than 33 years old. Performed at [facility], [address] [place], [place] [address]          Radiology  Studies: NM  Pulmonary Perfusion Result Date: 04/13/2024 CLINICAL DATA:  Pulmonary embolism (PE) suspected, high prob EXAM: NUCLEAR MEDICINE PERFUSION LUNG SCAN TECHNIQUE: Perfusion images were obtained in multiple projections after intravenous injection of radiopharmaceutical. No ventilation imaging performed. RADIOPHARMACEUTICALS:  4.38 mCi Tc-19m MAA IV COMPARISON:  Noncontrast chest CT 04/11/2024. Chest radiographs 04/11/2024. FINDINGS: There are no wedge-shaped perfusion defects to suggest acute pulmonary embolism. Pulmonary perfusion within normal limits. IMPRESSION: No evidence of acute pulmonary embolism on perfusion scintigraphy by PISAPED criteria. Electronically Signed   By: Elsie Perone M.D.   On: 04/13/2024 14:36   US  EKG SITE RITE Result Date: 04/12/2024 If Site Rite image not attached, placement could not be confirmed due to current cardiac rhythm.       Scheduled Meds:  atorvastatin   40 mg Oral QHS   brimonidine   1 drop Left Eye BID   budesonide  (PULMICORT ) nebulizer solution  0.5 mg Nebulization BID   Chlorhexidine  Gluconate Cloth  6 each Topical Daily   erythromycin   1 Application Left Eye Q2H   feeding supplement  237 mL Oral BID BM   heparin   5,000 Units Subcutaneous Q8H   insulin  aspart  0-5 Units Subcutaneous QHS   insulin  aspart  0-6 Units Subcutaneous TID WC   ipratropium-albuterol   3 mL Nebulization BID   lidocaine   1 patch Transdermal Q24H   mupirocin  ointment  1 Application Nasal BID   sertraline   50 mg Oral Daily   sodium chloride  flush  10-40 mL Intracatheter Q12H   sodium chloride  flush  3 mL Intravenous Q12H   traZODone   75 mg Oral QHS   Continuous Infusions:  sodium chloride  100 mL/hr at 04/14/24 0520   meropenem  (MERREM ) IV Stopped (04/14/24 0222)          Sophie Mao, MD [facility] 04/14/2024, 7:30 AM

## 2024-04-14 NOTE — Plan of Care (Signed)
  Problem: Coping: Goal: Ability to adjust to condition or change in health will improve Outcome: Not Progressing   Problem: Respiratory: Goal: Ability to maintain adequate ventilation will improve Outcome: Not Progressing   Problem: Clinical Measurements: Goal: Respiratory complications will improve Outcome: Not Progressing   Problem: Coping: Goal: Level of anxiety will decrease Outcome: Not Progressing   Problem: Pain Managment: Goal: General experience of comfort will improve and/or be controlled Outcome: Not Progressing

## 2024-04-15 ENCOUNTER — Inpatient Hospital Stay (HOSPITAL_COMMUNITY)

## 2024-04-15 DIAGNOSIS — N182 Chronic kidney disease, stage 2 (mild): Secondary | ICD-10-CM | POA: Diagnosis not present

## 2024-04-15 DIAGNOSIS — I10 Essential (primary) hypertension: Secondary | ICD-10-CM | POA: Diagnosis not present

## 2024-04-15 DIAGNOSIS — N179 Acute kidney failure, unspecified: Secondary | ICD-10-CM | POA: Diagnosis not present

## 2024-04-15 DIAGNOSIS — J9601 Acute respiratory failure with hypoxia: Secondary | ICD-10-CM | POA: Diagnosis not present

## 2024-04-15 LAB — CBC WITH DIFFERENTIAL/PLATELET
Abs Immature Granulocytes: 0.04 K/uL (ref 0.00–0.07)
Basophils Absolute: 0 K/uL (ref 0.0–0.1)
Basophils Relative: 0 %
Eosinophils Absolute: 0.1 K/uL (ref 0.0–0.5)
Eosinophils Relative: 1 %
HCT: 27.2 % — ABNORMAL LOW (ref 39.0–52.0)
Hemoglobin: 8.2 g/dL — ABNORMAL LOW (ref 13.0–17.0)
Immature Granulocytes: 1 %
Lymphocytes Relative: 13 %
Lymphs Abs: 0.9 K/uL (ref 0.7–4.0)
MCH: 24.3 pg — ABNORMAL LOW (ref 26.0–34.0)
MCHC: 30.1 g/dL (ref 30.0–36.0)
MCV: 80.7 fL (ref 80.0–100.0)
Monocytes Absolute: 0.6 K/uL (ref 0.1–1.0)
Monocytes Relative: 9 %
Neutro Abs: 5.7 K/uL (ref 1.7–7.7)
Neutrophils Relative %: 76 %
Platelets: 211 K/uL (ref 150–400)
RBC: 3.37 MIL/uL — ABNORMAL LOW (ref 4.22–5.81)
RDW: 18.6 % — ABNORMAL HIGH (ref 11.5–15.5)
WBC: 7.4 K/uL (ref 4.0–10.5)
nRBC: 0 % (ref 0.0–0.2)

## 2024-04-15 LAB — MAGNESIUM: Magnesium: 2 mg/dL (ref 1.7–2.4)

## 2024-04-15 LAB — GLUCOSE, CAPILLARY
Glucose-Capillary: 198 mg/dL — ABNORMAL HIGH (ref 70–99)
Glucose-Capillary: 260 mg/dL — ABNORMAL HIGH (ref 70–99)
Glucose-Capillary: 248 mg/dL — ABNORMAL HIGH (ref 70–99)
Glucose-Capillary: 247 mg/dL — ABNORMAL HIGH (ref 70–99)
Glucose-Capillary: 214 mg/dL — ABNORMAL HIGH (ref 70–99)

## 2024-04-15 LAB — COMPREHENSIVE METABOLIC PANEL WITH GFR
ALT: 25 U/L (ref 0–44)
AST: 27 U/L (ref 15–41)
Albumin: 2.3 g/dL — ABNORMAL LOW (ref 3.5–5.0)
Alkaline Phosphatase: 96 U/L (ref 38–126)
Anion gap: 7 (ref 5–15)
BUN: 23 mg/dL — ABNORMAL HIGH (ref 6–20)
CO2: 27 mmol/L (ref 22–32)
Calcium: 8 mg/dL — ABNORMAL LOW (ref 8.9–10.3)
Chloride: 98 mmol/L (ref 98–111)
Creatinine, Ser: 1.42 mg/dL — ABNORMAL HIGH (ref 0.61–1.24)
GFR, Estimated: 57 mL/min — ABNORMAL LOW (ref >60)
Glucose, Bld: 213 mg/dL — ABNORMAL HIGH (ref 70–99)
Potassium: 4.2 mmol/L (ref 3.5–5.1)
Sodium: 132 mmol/L — ABNORMAL LOW (ref 135–145)
Total Bilirubin: 0.5 mg/dL (ref 0.0–1.2)
Total Protein: 5.9 g/dL — ABNORMAL LOW (ref 6.5–8.1)

## 2024-04-15 LAB — BRAIN NATRIURETIC PEPTIDE: B Natriuretic Peptide: 163.4 pg/mL — ABNORMAL HIGH (ref 0.0–100.0)

## 2024-04-15 MED ORDER — INSULIN GLARGINE-YFGN 100 UNIT/ML ~~LOC~~ SOLN
10.0000 [IU] | Freq: Every day | SUBCUTANEOUS | Status: DC
  Administered 2024-04-15 – 2024-04-19 (×5): 10 [IU] via SUBCUTANEOUS
  Filled 2024-04-15 (×5): qty 0.1

## 2024-04-15 MED ORDER — FUROSEMIDE 10 MG/ML IJ SOLN
40.0000 mg | Freq: Once | INTRAMUSCULAR | Status: AC
  Administered 2024-04-15: 40 mg via INTRAVENOUS
  Filled 2024-04-15: qty 4

## 2024-04-15 MED ORDER — INSULIN ASPART 100 UNIT/ML IJ SOLN
2.0000 [IU] | Freq: Three times a day (TID) | INTRAMUSCULAR | Status: DC
  Administered 2024-04-15 – 2024-04-19 (×12): 2 [IU] via SUBCUTANEOUS

## 2024-04-15 NOTE — Plan of Care (Signed)

## 2024-04-15 NOTE — Evaluation (Signed)
 Physical Therapy Evaluation Patient Details Name: Brett White MRN: [id number] DOB: 06-12-1961 Today's Date: 04/15/2024  History of Present Illness  63 y.o. male presented from SNF on 04/11/24 with  fatigue, malaise,  hypoxic and required 15 L high flow nasal cannula oxygen.  Creatinine of 2.7, WBC of 23.4, lactic acid of 5.4.  CT of chest, abdomen, pelvis revealed bibasilar atelectasis and nonobstructive right renal calculi, PMH: hypertension, hyperlipidemia, insulin -dependent diabetes mellitus, depression, anxiety, insomnia, IBS, chronic pain syndrome, left BKA and recent hospitalization from 03/20/2024-03/24/2024 for ESBL E. coli UTI .  Clinical Impression  The patient  known to therapy  from previous encounters./ patient did  mobilize to sitting onto bed edge x ~ 15', conversive and pleasant. Reports 88999 pain in flank. RN  aware and will medicate . Patient resides in LTC, is modified independent with WC, Independent in ADL's, limited ambulation. Plans are to return to LTC. Pt admitted with above diagnosis.  Pt currently with functional limitations due to the deficits listed below (see PT Problem List). Pt will benefit from acute skilled PT to increase their independence and safety with mobility to allow discharge.         If plan is discharge home, recommend the following: A little help with walking and/or transfers;A little help with bathing/dressing/bathroom;Help with stairs or ramp for entrance;Assist for transportation   Can travel by private vehicle        Equipment Recommendations None recommended by PT  Recommendations for Other Services       Functional Status Assessment Patient has had a recent decline in their functional status and demonstrates the ability to make significant improvements in function in a reasonable and predictable amount of time.     Precautions / Restrictions Precautions Precautions: Fall Recall of Precautions/Restrictions:  Intact Precaution/Restrictions Comments: on HFNC Required Braces or Orthoses: Other Brace Other Brace: has L prosthesis, not  in room Restrictions Weight Bearing Restrictions Per Provider Order: No      Mobility  Bed Mobility Overal bed mobility: Independent             General bed mobility comments: patient moved self to sitting and back to supine    Transfers                        Ambulation/Gait                  Stairs            Wheelchair Mobility     Tilt Bed    Modified Rankin (Stroke Patients Only)       Balance Overall balance assessment: Needs assistance Sitting-balance support: No upper extremity supported Sitting balance-Leahy Scale: Good Sitting balance - Comments: x 20       Standing balance comment: NT                             Pertinent Vitals/Pain Pain Assessment Pain Score: 10-Worst pain ever Pain Location: 1000, flank kidney Pain Descriptors / Indicators: Discomfort Pain Intervention(s): Monitored during session, Patient requesting pain meds-RN notified    Home Living Family/patient expects to be discharged to:: Skilled nursing facility                   Additional Comments: LTC at [facility]    Prior Function Prior Level of Function : Independent/Modified Independent  Mobility Comments: Wears L proshtesis, transfer to w/c or uses prosthetic + RW about 17ft ADLs Comments: wheels himself into shower, ind for ADLs/iALDS     Extremity/Trunk Assessment   Upper Extremity Assessment Upper Extremity Assessment: Overall WFL for tasks assessed    Lower Extremity Assessment Lower Extremity Assessment: LLE deficits/detail LLE Deficits / Details: BKA    Cervical / Trunk Assessment Cervical / Trunk Assessment: Normal  Communication   Communication Communication: No apparent difficulties    Cognition Arousal: Alert Behavior During Therapy: WFL for tasks  assessed/performed                             Following commands: Intact       Cueing       General Comments      Exercises     Assessment/Plan    PT Assessment Patient needs continued PT services  PT Problem List         PT Treatment Interventions Gait training;DME instruction;Stair training;Functional mobility training;Therapeutic activities;Therapeutic exercise;Balance training;Patient/family education    PT Goals (Current goals can be found in the Care Plan section)  Acute Rehab PT Goals Patient Stated Goal: return to independence PT Goal Formulation: With patient Time For Goal Achievement: 04/29/24 Potential to Achieve Goals: Good    Frequency Min 1X/week     Co-evaluation               AM-PAC PT 6 Clicks Mobility  Outcome Measure Help needed turning from your back to your side while in a flat bed without using bedrails?: None Help needed moving from lying on your back to sitting on the side of a flat bed without using bedrails?: None Help needed moving to and from a bed to a chair (including a wheelchair)?: A Lot Help needed standing up from a chair using your arms (e.g., wheelchair or bedside chair)?: A Lot Help needed to walk in hospital room?: Total Help needed climbing 3-5 steps with a railing? : Total 6 Click Score: 14    End of Session Equipment Utilized During Treatment: Gait belt Activity Tolerance: Patient tolerated treatment well Patient left: in bed;with call bell/phone within reach Nurse Communication: Mobility status PT Visit Diagnosis: Unsteadiness on feet (R26.81);Pain;Muscle weakness (generalized) (M62.81)    Time: 8854-8794 PT Time Calculation (min) (ACUTE ONLY): 20 min   Charges:   PT Evaluation $PT Eval Low Complexity: 1 Low   PT General Charges $$ ACUTE PT VISIT: 1 Visit         Darice Potters PT Acute Rehabilitation Services Office [phone]   Potters Darice Norris 04/15/2024, 1:39 PM

## 2024-04-15 NOTE — Inpatient Diabetes Management (Signed)
 Inpatient Diabetes Program Recommendations  AACE/ADA: New Consensus Statement on Inpatient Glycemic Control (2015)  Target Ranges:  Prepandial:   less than 140 mg/dL      Peak postprandial:   less than 180 mg/dL (1-2 hours)      Critically ill patients:  140 - 180 mg/dL   Lab Results  Component Value Date   GLUCAP 198 (H) 04/15/2024   HGBA1C 8.1 (H) 11/21/2023    Review of Glycemic Control  Latest Reference Range & Units 04/14/24 07:55 04/14/24 11:38 04/14/24 16:17 04/14/24 22:14 04/15/24 08:09  Glucose-Capillary 70 - 99 mg/dL 797 (H) 715 (H) 752 (H) 279 (H) 198 (H)  (H): Data is abnormally high  Diabetes history: DM2   Outpatient Diabetes medications:  Trulicity  0.5 mg weekly Glipizide  10 mg QAM Humalog 2-8 units TID Farxiga  10 mg every day   Current orders for Inpatient glycemic control:  Novolog  0-6 units TID and 0-5 units QHS   Inpatient Diabetes Program Recommendations:     Semglee  10 units QD Novolog  2 units TID with meals if he consumes at least 50%   Thank you, Wyvonna Pinal, MSN, CDCES Diabetes Coordinator Inpatient Diabetes Program [phone] (team pager from 8a-5p)

## 2024-04-15 NOTE — Progress Notes (Addendum)
 PROGRESS NOTE    Brett White  FMW:[id number] DOB: 08/21/1961 DOA: 04/11/2024 PCP: [facility]   Brief Narrative: Brett White is a 63 y.o. male with a history of hypertension, hyperlipidemia, diabetes mellitus, depression, anxiety, insomnia, IBS, chronic pain syndrome, left BKA, ESBL UTI.  Patient presented secondary to fatigue, malaise, nausea, vomiting and flank pain and was found to have evidence of sepsis secondary to UTI, in addition to AKI and acute respiratory failure. Empiric antibiotics started. No etiology for respiratory failure identified.   Assessment and Plan:  Acute respiratory failure with hypoxia Unclear etiology. CT chest without clear etiology. V/Q scan obtained and was with no evidence of acute PE. Patient requiring as much as 10 L/min of supplemental oxygen. Repeat chest x-ray )7/15) significant for evidence of pulmonary edema, which could be contributory. Patient given Lasix  80 mg IV x1 with resultant worsening renal function. Weaned to 8 L/min from yesterday. -Continue to wean to room air as able -Incentive spirometry -Check BNP; Transthoracic Echocardiogram -Repeat chest x-ray -If workup continues to be unfruitful, will consult pulmonology for assistance  Severe sepsis Patient on admission.  Secondary to UTI.  Patient started empirically on vancomycin , ceftriaxone , azithromycin  and transition to meropenem  for ESBL coverage.  Blood cultures with no growth.  ESBL E. coli UTI Present on admission.  Urine culture significant for ESBL E. coli. - Continue meropenem   AKI on CKD stage II Baseline creatinine appears to be around 1.24.  Creatinine of 2.70 on admission in the setting of sepsis.  Patient managed with IV fluids.  Creatinine down to a low of 1.28 but is now creeping up after Lasix  IV. - BMP daily - Check renal ultrasound  Leukocytosis Secondary to acute infection.  Resolved.  Anemia of chronic disease Baseline hemoglobin appears  to be around 12. Acute drop this admission of unknown significant or etiology. No evidence of hemorrhaging. Patient with CT chest abdomen and pelvis without evidence for hematoma.  Primary hypertension -Continue amlodipine   Hyperlipidemia -Continue Lipitor 40 mg daily  Hyponatremia Mild.  Stable.  Diabetes mellitus type 2 Uncontrolled based off last hemoglobin A1c of 8.1%.  Patient is on glipizide , Humalog, Farxiga , Trulicity  as an outpatient for management. - Continue SSI  BPH - Continue tamsulosin   Anxiety Depression -Continue Cymbalta  - Continue Zoloft   Chronic pain syndrome Opiate dependence Noted. - Continue Lyrica  - Continue oxycodone  as needed   DVT prophylaxis: Subcutaneous heparin  Code Status:   Code Status: Full Code Family Communication: None at bedside Disposition Plan: Discharge home once patient has completed IV antibiotic course and ability to wean oxygen to room air   Consultants:  None  Procedures:  None  Antimicrobials: Vancomycin  Ceftriaxone  Azithromycin  Meropenem     Subjective: Patient reports continued right flank pain and dysuria. Feels bad, but difficult to explain. Feels chest pressure.    Objective: BP 139/83   Pulse 74   Temp 97.9 F (36.6 C) (Oral)   Resp (!) 9   Ht 6' 2 (1.88 m)   Wt 128.1 kg   SpO2 98%   BMI 36.26 kg/m   Examination:  General exam: Appears calm and comfortable Respiratory system: Clear to auscultation. Respiratory effort normal. Cardiovascular system: S1 & S2 heard, RRR. No murmurs, rubs, gallops or clicks. Gastrointestinal system: Abdomen is nondistended, soft and nontender. No organomegaly or masses felt. Decreased bowel sounds heard. Central nervous system: Alert and oriented. No focal neurological deficits. Musculoskeletal: No edema. No calf tenderness. Left BKA Skin: No cyanosis. No rashes Psychiatry:  Judgement and insight appear normal. Mood & affect appropriate.    Data Reviewed: I have  personally reviewed following labs and imaging studies  CBC Lab Results  Component Value Date   WBC 7.4 04/15/2024   RBC 3.37 (L) 04/15/2024   HGB 8.2 (L) 04/15/2024   HCT 27.2 (L) 04/15/2024   MCV 80.7 04/15/2024   MCH 24.3 (L) 04/15/2024   PLT 211 04/15/2024   MCHC 30.1 04/15/2024   RDW 18.6 (H) 04/15/2024   LYMPHSABS 0.9 04/15/2024   MONOABS 0.6 04/15/2024   EOSABS 0.1 04/15/2024   BASOSABS 0.0 04/15/2024     Last metabolic panel Lab Results  Component Value Date   NA 132 (L) 04/15/2024   K 4.2 04/15/2024   CL 98 04/15/2024   CO2 27 04/15/2024   BUN 23 (H) 04/15/2024   CREATININE 1.42 (H) 04/15/2024   GLUCOSE 213 (H) 04/15/2024   GFRNONAA 57 (L) 04/15/2024   CALCIUM  8.0 (L) 04/15/2024   PHOS 3.6 02/16/2022   PROT 5.9 (L) 04/15/2024   ALBUMIN  2.3 (L) 04/15/2024   BILITOT 0.5 04/15/2024   ALKPHOS 96 04/15/2024   AST 27 04/15/2024   ALT 25 04/15/2024   ANIONGAP 7 04/15/2024    GFR: Estimated Creatinine Clearance: 78.7 mL/min (A) (by C-G formula based on SCr of 1.42 mg/dL (H)).  Recent Results (from the past 240 hours)  Urine Culture     Status: Abnormal   Collection Time: 04/11/24  3:31 PM   Specimen: Urine, Random  Result Value Ref Range Status   Specimen Description   Final    URINE, RANDOM Performed at [facility], [address], [place], [place] [address]    Special Requests   Final    NONE Reflexed from [id number] Performed at [facility], [address], [place], [place] [address]    Culture (A)  Final    80,000 COLONIES/mL ESCHERICHIA COLI Confirmed Extended Spectrum Beta-Lactamase Producer (ESBL).  In bloodstream infections from ESBL organisms, carbapenems are preferred over piperacillin /tazobactam. They are shown to have a lower risk of mortality.    Report Status 04/14/2024 FINAL  Final   Organism ID, Bacteria ESCHERICHIA COLI (A)  Final      Susceptibility   Escherichia coli - MIC*    AMPICILLIN  >=32  RESISTANT Resistant     CEFAZOLIN  >=64 RESISTANT Resistant     CEFEPIME  >=32 RESISTANT Resistant     CEFTRIAXONE  >=64 RESISTANT Resistant     CIPROFLOXACIN >=4 RESISTANT Resistant     GENTAMICIN  <=1 SENSITIVE Sensitive     IMIPENEM <=0.25 SENSITIVE Sensitive     NITROFURANTOIN <=16 SENSITIVE Sensitive     TRIMETH /SULFA  >=320 RESISTANT Resistant     AMPICILLIN /SULBACTAM >=32 RESISTANT Resistant     PIP/TAZO 8 SENSITIVE Sensitive ug/mL    * 80,000 COLONIES/mL ESCHERICHIA COLI  Blood Culture (routine x 2)     Status: None (Preliminary result)   Collection Time: 04/11/24  5:40 PM   Specimen: BLOOD LEFT FOREARM  Result Value Ref Range Status   Specimen Description   Final    BLOOD LEFT FOREARM Performed at [facility], [address], [place], [place] [address]    Special Requests   Final    BOTTLES DRAWN AEROBIC AND ANAEROBIC Blood Culture results may not be optimal due to an inadequate volume of blood received in culture bottles Performed at [facility], [address] [place], [place] [address]    Culture  Final    NO GROWTH 4 DAYS Performed at [facility], [address], [place], [place] [address]    Report Status PENDING  Incomplete  Blood Culture (routine x 2)     Status: None (Preliminary result)   Collection Time: 04/11/24  5:46 PM   Specimen: BLOOD LEFT HAND  Result Value Ref Range Status   Specimen Description BLOOD LEFT HAND  Final   Special Requests   Final    BOTTLES DRAWN AEROBIC AND ANAEROBIC Blood Culture adequate volume   Culture   Final    NO GROWTH 4 DAYS Performed at [facility], [address], [place], [place] [address]    Report Status PENDING  Incomplete  Resp panel by RT-PCR (RSV, Flu A&B, Covid) Anterior Nasal Swab     Status: None   Collection Time: 04/11/24  6:50 PM   Specimen: Anterior Nasal Swab  Result Value Ref Range Status   SARS Coronavirus 2 by RT PCR NEGATIVE NEGATIVE Final    Comment:  (NOTE) SARS-CoV-2 target nucleic acids are NOT DETECTED.  The SARS-CoV-2 RNA is generally detectable in upper respiratory specimens during the acute phase of infection. The lowest concentration of SARS-CoV-2 viral copies this assay can detect is 138 copies/mL. A negative result does not preclude SARS-Cov-2 infection and should not be used as the sole basis for treatment or other patient management decisions. A negative result may occur with  improper specimen collection/handling, submission of specimen other than nasopharyngeal swab, presence of viral mutation(s) within the areas targeted by this assay, and inadequate number of viral copies(<138 copies/mL). A negative result must be combined with clinical observations, patient history, and epidemiological information. The expected result is Negative.  Fact Sheet for Patients:  BloggerCourse.com  Fact Sheet for Healthcare Providers:  SeriousBroker.it  This test is no t yet approved or cleared by the [place]  FDA and  has been authorized for detection and/or diagnosis of SARS-CoV-2 by FDA under an Emergency Use Authorization (EUA). This EUA will remain  in effect (meaning this test can be used) for the duration of the COVID-19 declaration under Section 564(b)(1) of the Act, 21 U.S.C.section 360bbb-3(b)(1), unless the authorization is terminated  or revoked sooner.       Influenza A by PCR NEGATIVE NEGATIVE Final   Influenza B by PCR NEGATIVE NEGATIVE Final    Comment: (NOTE) The Xpert Xpress SARS-CoV-2/FLU/RSV plus assay is intended as an aid in the diagnosis of influenza from Nasopharyngeal swab specimens and should not be used as a sole basis for treatment. Nasal washings and aspirates are unacceptable for Xpert Xpress SARS-CoV-2/FLU/RSV testing.  Fact Sheet for Patients: BloggerCourse.com  Fact Sheet for Healthcare  Providers: SeriousBroker.it  This test is not yet approved or cleared by the [place]  FDA and has been authorized for detection and/or diagnosis of SARS-CoV-2 by FDA under an Emergency Use Authorization (EUA). This EUA will remain in effect (meaning this test can be used) for the duration of the COVID-19 declaration under Section 564(b)(1) of the Act, 21 U.S.C. section 360bbb-3(b)(1), unless the authorization is terminated or revoked.     Resp Syncytial Virus by PCR NEGATIVE NEGATIVE Final    Comment: (NOTE) Fact Sheet for Patients: BloggerCourse.com  Fact Sheet for Healthcare Providers: SeriousBroker.it  This test is not yet approved or cleared by the [place]  FDA and has been authorized for detection and/or diagnosis of SARS-CoV-2 by FDA under an Emergency Use Authorization (EUA). This EUA will remain in  effect (meaning this test can be used) for the duration of the COVID-19 declaration under Section 564(b)(1) of the Act, 21 U.S.C. section 360bbb-3(b)(1), unless the authorization is terminated or revoked.  Performed at [facility], [address] [place], [place] [address]   MRSA Next Gen by PCR, Nasal     Status: Abnormal   Collection Time: 04/12/24 12:33 AM   Specimen: Nasal Mucosa; Nasal Swab  Result Value Ref Range Status   MRSA by PCR Next Gen DETECTED (A) NOT DETECTED Final    Comment: CRITICAL RESULT CALLED TO, READ BACK BY AND VERIFIED WITH: REXIE MANO RN AT 04/12/2024 0609 BY JEREMY C (NOTE) The GeneXpert MRSA Assay (FDA approved for NASAL specimens only), is one component of a comprehensive MRSA colonization surveillance program. It is not intended to diagnose MRSA infection nor to guide or monitor treatment for MRSA infections. Test performance is not FDA approved in patients less than 1 years old. Performed at [facility], [address] [place], [place] [address]       Radiology Studies: DG CHEST PORT 1 VIEW Result Date: 04/14/2024 CLINICAL DATA:  Dyspnea EXAM: PORTABLE CHEST 1 VIEW COMPARISON:  Chest radiograph dated 04/11/2024 FINDINGS: Right upper extremity PICC tip projects over the SVC. Mildly low lung volumes. Increased diffuse interstitial opacities. No pleural effusion or pneumothorax. Similar mildly enlarged cardiomediastinal silhouette. No acute osseous abnormality. IMPRESSION: Increased pulmonary edema. Electronically Signed   By: Limin  Xu M.D.   On: 04/14/2024 12:07   NM Pulmonary Perfusion Result Date: 04/13/2024 CLINICAL DATA:  Pulmonary embolism (PE) suspected, high prob EXAM: NUCLEAR MEDICINE PERFUSION LUNG SCAN TECHNIQUE: Perfusion images were obtained in multiple projections after intravenous injection of radiopharmaceutical. No ventilation imaging performed. RADIOPHARMACEUTICALS:  4.38 mCi Tc-69m MAA IV COMPARISON:  Noncontrast chest CT 04/11/2024. Chest radiographs 04/11/2024. FINDINGS: There are no wedge-shaped perfusion defects to suggest acute pulmonary embolism. Pulmonary perfusion within normal limits. IMPRESSION: No evidence of acute pulmonary embolism on perfusion scintigraphy by PISAPED criteria. Electronically Signed   By: Elsie Perone M.D.   On: 04/13/2024 14:36      LOS: 4 days    Elgin Lam, MD [facility] 04/15/2024, 8:05 AM   If 7PM-7AM, please contact night-coverage [email or web address]

## 2024-04-15 NOTE — Hospital Course (Addendum)
 Brett White is a 63 y.o. male with a history of hypertension, hyperlipidemia, diabetes mellitus, depression, anxiety, insomnia, IBS, chronic pain syndrome, left BKA, ESBL UTI.  Patient presented secondary to fatigue, malaise, nausea, vomiting and flank pain and was found to have evidence of sepsis secondary to UTI/pyelonephritis, in addition to AKI and acute respiratory failure. Empiric antibiotics started. Respiratory failure likely secondary to acute diastolic heart failure and resolved with Lasix  diuresis. Patient completed antibiotic regimen prior to discharge.

## 2024-04-15 NOTE — TOC Progression Note (Signed)
 Transition of Care [facility]) - Progression Note    Patient Details  Name: Brett White MRN: [id number] Date of Birth: [birth year]  Transition of [facility]) CM/SW Contact  Jon ONEIDA Anon, RN Phone Number: 04/15/2024, 4:03 PM  Clinical Narrative:    Pt remains in ICU, needing IV ABT and to wean O2. Currently requiring HFNC 7L O2, and was not requiring O2 prior to this admission. Once pt medically stable, can discharge back to LTC bed at [facility]. TOC continuing to follow.   Expected Discharge Plan: Long Term Nursing Home Barriers to Discharge: Continued Medical Work up  Expected Discharge Plan and Services In-house Referral: Clinical Social Work Discharge Planning Services: CM Consult Post Acute Care Choice: Nursing Home Living arrangements for the past 2 months: Skilled Nursing Facility                 DME Arranged: N/A DME Agency: NA       HH Arranged: NA HH Agency: NA         Social Determinants of Health (SDOH) Interventions SDOH Screenings   Food Insecurity: No Food Insecurity (04/12/2024)  Housing: Low Risk  (04/12/2024)  Transportation Needs: No Transportation Needs (04/12/2024)  Utilities: Not At Risk (04/12/2024)  Tobacco Use: Low Risk  (04/11/2024)    Readmission Risk Interventions    04/13/2024    3:04 PM 11/16/2022    2:57 PM  Readmission Risk Prevention Plan  Transportation Screening Complete Complete  Medication Review (RN Care Manager) Complete Complete  PCP or Specialist appointment within 3-5 days of discharge Complete Complete  HRI or Home Care Consult Complete Complete  SW Recovery Care/Counseling Consult Complete Complete  Palliative Care Screening Complete Complete  Skilled Nursing Facility Complete Complete

## 2024-04-16 ENCOUNTER — Inpatient Hospital Stay (HOSPITAL_COMMUNITY)

## 2024-04-16 DIAGNOSIS — E1165 Type 2 diabetes mellitus with hyperglycemia: Secondary | ICD-10-CM | POA: Diagnosis not present

## 2024-04-16 DIAGNOSIS — I10 Essential (primary) hypertension: Secondary | ICD-10-CM | POA: Diagnosis not present

## 2024-04-16 DIAGNOSIS — N179 Acute kidney failure, unspecified: Secondary | ICD-10-CM | POA: Diagnosis not present

## 2024-04-16 DIAGNOSIS — I5031 Acute diastolic (congestive) heart failure: Secondary | ICD-10-CM

## 2024-04-16 DIAGNOSIS — J9601 Acute respiratory failure with hypoxia: Secondary | ICD-10-CM | POA: Diagnosis not present

## 2024-04-16 LAB — GLUCOSE, CAPILLARY
Glucose-Capillary: 241 mg/dL — ABNORMAL HIGH (ref 70–99)
Glucose-Capillary: 237 mg/dL — ABNORMAL HIGH (ref 70–99)
Glucose-Capillary: 267 mg/dL — ABNORMAL HIGH (ref 70–99)
Glucose-Capillary: 213 mg/dL — ABNORMAL HIGH (ref 70–99)

## 2024-04-16 LAB — ECHOCARDIOGRAM COMPLETE
Area-P 1/2: 4.3 cm2
Calc EF: 53.7 %
Height: 74 in
S' Lateral: 3.7 cm
Single Plane A2C EF: 54.6 %
Single Plane A4C EF: 53.5 %
Weight: 4490.33 [oz_av]

## 2024-04-16 LAB — BASIC METABOLIC PANEL WITH GFR
Anion gap: 9 (ref 5–15)
BUN: 30 mg/dL — ABNORMAL HIGH (ref 6–20)
CO2: 30 mmol/L (ref 22–32)
Calcium: 8.5 mg/dL — ABNORMAL LOW (ref 8.9–10.3)
Chloride: 98 mmol/L (ref 98–111)
Creatinine, Ser: 1.48 mg/dL — ABNORMAL HIGH (ref 0.61–1.24)
GFR, Estimated: 54 mL/min — ABNORMAL LOW (ref >60)
Glucose, Bld: 276 mg/dL — ABNORMAL HIGH (ref 70–99)
Potassium: 4.7 mmol/L (ref 3.5–5.1)
Sodium: 137 mmol/L (ref 135–145)

## 2024-04-16 LAB — CULTURE, BLOOD (ROUTINE X 2)
Culture: NO GROWTH
Culture: NO GROWTH
Special Requests: ADEQUATE

## 2024-04-16 LAB — MAGNESIUM: Magnesium: 2.1 mg/dL (ref 1.7–2.4)

## 2024-04-16 MED ORDER — HYDROMORPHONE HCL 1 MG/ML IJ SOLN
0.5000 mg | Freq: Two times a day (BID) | INTRAMUSCULAR | Status: DC | PRN
  Administered 2024-04-16 – 2024-04-19 (×5): 0.5 mg via INTRAVENOUS
  Filled 2024-04-16 (×4): qty 0.5
  Filled 2024-04-16: qty 1
  Filled 2024-04-16 (×2): qty 0.5

## 2024-04-16 MED ORDER — FUROSEMIDE 10 MG/ML IJ SOLN
40.0000 mg | Freq: Once | INTRAMUSCULAR | Status: AC
  Administered 2024-04-16: 40 mg via INTRAVENOUS
  Filled 2024-04-16: qty 4

## 2024-04-16 NOTE — Progress Notes (Signed)
  Echocardiogram 2D Echocardiogram has been performed.  Devora Ellouise SAUNDERS 04/16/2024, 9:02 AM

## 2024-04-16 NOTE — Plan of Care (Signed)
  Problem: Fluid Volume: Goal: Ability to maintain a balanced intake and output will improve Outcome: Progressing   Problem: Skin Integrity: Goal: Risk for impaired skin integrity will decrease Outcome: Progressing   Problem: Clinical Measurements: Goal: Cardiovascular complication will be avoided Outcome: Progressing   Problem: Pain Managment: Goal: General experience of comfort will improve and/or be controlled Outcome: Not Progressing   Problem: Safety: Goal: Ability to remain free from injury will improve Outcome: Progressing

## 2024-04-16 NOTE — Progress Notes (Signed)
 PROGRESS NOTE    Brett White  FMW:[id number] DOB: 1961/07/06 DOA: 04/11/2024 PCP: [facility]   Brief Narrative: Brett White is a 63 y.o. male with a history of hypertension, hyperlipidemia, diabetes mellitus, depression, anxiety, insomnia, IBS, chronic pain syndrome, left BKA, ESBL UTI.  Patient presented secondary to fatigue, malaise, nausea, vomiting and flank pain and was found to have evidence of sepsis secondary to UTI, in addition to AKI and acute respiratory failure. Empiric antibiotics started. No etiology for respiratory failure identified.   Assessment and Plan:  Acute respiratory failure with hypoxia Unclear etiology. CT chest without clear etiology. V/Q scan obtained and was with no evidence of acute PE. Patient requiring as much as 10 L/min of supplemental oxygen. Repeat chest x-ray )7/15) significant for evidence of pulmonary edema, which could be contributory. Patient given Lasix  80 mg IV x1 with resultant worsening renal function. Weaned down to 2 L/min today with continued diuresis. -Continue to wean to room air as able -Incentive spirometry -Follow-up Transthoracic Echocardiogram results  Severe sepsis Patient on admission.  Secondary to UTI.  Patient started empirically on vancomycin , ceftriaxone , azithromycin  and transition to meropenem  for ESBL coverage.  Blood cultures with no growth.  ESBL E. coli UTI Present on admission.  Urine culture significant for ESBL E. coli. -Continue meropenem   Right flank pain Continued and not improved. Patient reports it feeling like barbs. States he does not think it is muscle related. Renal ultrasound without abnormality to explain. - Will obtain a CT renal stone study; if negative, will treat as musculoskeletal pain  AKI on CKD stage II Baseline creatinine appears to be around 1.24.  Creatinine of 2.70 on admission in the setting of sepsis.  Patient managed with IV fluids.  Creatinine down to a low of 1.28  but has risen slightly - BMP daily  Leukocytosis Secondary to acute infection.  Resolved.  Anemia of chronic disease Baseline hemoglobin appears to be around 12. Acute drop this admission of unknown significant or etiology. No evidence of hemorrhaging. Patient with CT chest abdomen and pelvis without evidence for hematoma.  Primary hypertension -Continue amlodipine   Hyperlipidemia -Continue Lipitor 40 mg daily  Hyponatremia Mild.  Stable.  Diabetes mellitus type 2 Uncontrolled based off last hemoglobin A1c of 8.1%.  Patient is on glipizide , Humalog, Farxiga , Trulicity  as an outpatient for management. - Continue SSI  BPH - Continue tamsulosin   Anxiety Depression -Continue Cymbalta  - Continue Zoloft   Chronic pain syndrome Opiate dependence Noted. - Continue Lyrica  - Continue oxycodone  as needed  Obesity, class II Estimated body mass index is 36.03 kg/m as calculated from the following:   Height as of this encounter: 6' 2 (1.88 m).   Weight as of this encounter: 127.3 kg.   DVT prophylaxis: Subcutaneous heparin  Code Status:   Code Status: Full Code Family Communication: None at bedside Disposition Plan: Discharge home once patient has completed IV antibiotic course and ability to wean oxygen to room air   Consultants:  None  Procedures:  Transthoracic Echocardiogram (pending)  Antimicrobials: Vancomycin  Ceftriaxone  Azithromycin  Meropenem     Subjective: Continued right flank pain  Objective: BP (!) 159/118 (BP Location: Left Arm)   Pulse 94   Temp 98 F (36.7 C) (Oral)   Resp 19   Ht 6' 2 (1.88 m)   Wt 127.3 kg   SpO2 96%   BMI 36.03 kg/m   Examination:  General exam: Appears calm and comfortable Respiratory system: Clear to auscultation. Respiratory effort normal. Cardiovascular system: S1 &  S2 heard, RRR. No murmurs, rubs, gallops or clicks. Gastrointestinal system: Abdomen is protuberant. Tenderness of right flank to palpation. Normal  bowel sounds heard. Central nervous system: Alert and oriented. No focal neurological deficits. Psychiatry: Judgement and insight appear normal. Mood & affect appropriate.    Data Reviewed: I have personally reviewed following labs and imaging studies  CBC Lab Results  Component Value Date   WBC 7.4 04/15/2024   RBC 3.37 (L) 04/15/2024   HGB 8.2 (L) 04/15/2024   HCT 27.2 (L) 04/15/2024   MCV 80.7 04/15/2024   MCH 24.3 (L) 04/15/2024   PLT 211 04/15/2024   MCHC 30.1 04/15/2024   RDW 18.6 (H) 04/15/2024   LYMPHSABS 0.9 04/15/2024   MONOABS 0.6 04/15/2024   EOSABS 0.1 04/15/2024   BASOSABS 0.0 04/15/2024     Last metabolic panel Lab Results  Component Value Date   NA 137 04/16/2024   K 4.7 04/16/2024   CL 98 04/16/2024   CO2 30 04/16/2024   BUN 30 (H) 04/16/2024   CREATININE 1.48 (H) 04/16/2024   GLUCOSE 276 (H) 04/16/2024   GFRNONAA 54 (L) 04/16/2024   CALCIUM  8.5 (L) 04/16/2024   PHOS 3.6 02/16/2022   PROT 5.9 (L) 04/15/2024   ALBUMIN  2.3 (L) 04/15/2024   BILITOT 0.5 04/15/2024   ALKPHOS 96 04/15/2024   AST 27 04/15/2024   ALT 25 04/15/2024   ANIONGAP 9 04/16/2024    GFR: Estimated Creatinine Clearance: 75.2 mL/min (A) (by C-G formula based on SCr of 1.48 mg/dL (H)).  Recent Results (from the past 240 hours)  Urine Culture     Status: Abnormal   Collection Time: 04/11/24  3:31 PM   Specimen: Urine, Random  Result Value Ref Range Status   Specimen Description   Final    URINE, RANDOM Performed at [facility], [address] [place], [place] [address]    Special Requests   Final    NONE Reflexed from [id number] Performed at [facility], [address], [place], [place] [address]    Culture (A)  Final    80,000 COLONIES/mL ESCHERICHIA COLI Confirmed Extended Spectrum Beta-Lactamase Producer (ESBL).  In bloodstream infections from ESBL organisms, carbapenems are preferred over piperacillin /tazobactam. They are shown to  have a lower risk of mortality.    Report Status 04/14/2024 FINAL  Final   Organism ID, Bacteria ESCHERICHIA COLI (A)  Final      Susceptibility   Escherichia coli - MIC*    AMPICILLIN  >=32 RESISTANT Resistant     CEFAZOLIN  >=64 RESISTANT Resistant     CEFEPIME  >=32 RESISTANT Resistant     CEFTRIAXONE  >=64 RESISTANT Resistant     CIPROFLOXACIN >=4 RESISTANT Resistant     GENTAMICIN  <=1 SENSITIVE Sensitive     IMIPENEM <=0.25 SENSITIVE Sensitive     NITROFURANTOIN <=16 SENSITIVE Sensitive     TRIMETH /SULFA  >=320 RESISTANT Resistant     AMPICILLIN /SULBACTAM >=32 RESISTANT Resistant     PIP/TAZO 8 SENSITIVE Sensitive ug/mL    * 80,000 COLONIES/mL ESCHERICHIA COLI  Blood Culture (routine x 2)     Status: None   Collection Time: 04/11/24  5:40 PM   Specimen: BLOOD LEFT FOREARM  Result Value Ref Range Status   Specimen Description   Final    BLOOD LEFT FOREARM Performed at [facility], [address], [place], [place] [address]    Special Requests   Final    BOTTLES DRAWN AEROBIC AND ANAEROBIC Blood Culture results may not  be optimal due to an inadequate volume of blood received in culture bottles Performed at [facility], [address], [place], [place] [address]    Culture   Final    NO GROWTH 5 DAYS Performed at [facility], [address] [place], [place] [address]    Report Status 04/16/2024 FINAL  Final  Blood Culture (routine x 2)     Status: None   Collection Time: 04/11/24  5:46 PM   Specimen: BLOOD LEFT HAND  Result Value Ref Range Status   Specimen Description BLOOD LEFT HAND  Final   Special Requests   Final    BOTTLES DRAWN AEROBIC AND ANAEROBIC Blood Culture adequate volume   Culture   Final    NO GROWTH 5 DAYS Performed at [facility], [address], [place], [place] [address]    Report Status 04/16/2024 FINAL  Final  Resp panel by RT-PCR (RSV, Flu A&B, Covid) Anterior Nasal Swab     Status: None   Collection Time:  04/11/24  6:50 PM   Specimen: Anterior Nasal Swab  Result Value Ref Range Status   SARS Coronavirus 2 by RT PCR NEGATIVE NEGATIVE Final    Comment: (NOTE) SARS-CoV-2 target nucleic acids are NOT DETECTED.  The SARS-CoV-2 RNA is generally detectable in upper respiratory specimens during the acute phase of infection. The lowest concentration of SARS-CoV-2 viral copies this assay can detect is 138 copies/mL. A negative result does not preclude SARS-Cov-2 infection and should not be used as the sole basis for treatment or other patient management decisions. A negative result may occur with  improper specimen collection/handling, submission of specimen other than nasopharyngeal swab, presence of viral mutation(s) within the areas targeted by this assay, and inadequate number of viral copies(<138 copies/mL). A negative result must be combined with clinical observations, patient history, and epidemiological information. The expected result is Negative.  Fact Sheet for Patients:  BloggerCourse.com  Fact Sheet for Healthcare Providers:  SeriousBroker.it  This test is no t yet approved or cleared by the [place]  FDA and  has been authorized for detection and/or diagnosis of SARS-CoV-2 by FDA under an Emergency Use Authorization (EUA). This EUA will remain  in effect (meaning this test can be used) for the duration of the COVID-19 declaration under Section 564(b)(1) of the Act, 21 U.S.C.section 360bbb-3(b)(1), unless the authorization is terminated  or revoked sooner.       Influenza A by PCR NEGATIVE NEGATIVE Final   Influenza B by PCR NEGATIVE NEGATIVE Final    Comment: (NOTE) The Xpert Xpress SARS-CoV-2/FLU/RSV plus assay is intended as an aid in the diagnosis of influenza from Nasopharyngeal swab specimens and should not be used as a sole basis for treatment. Nasal washings and aspirates are unacceptable for Xpert Xpress  SARS-CoV-2/FLU/RSV testing.  Fact Sheet for Patients: BloggerCourse.com  Fact Sheet for Healthcare Providers: SeriousBroker.it  This test is not yet approved or cleared by the [place]  FDA and has been authorized for detection and/or diagnosis of SARS-CoV-2 by FDA under an Emergency Use Authorization (EUA). This EUA will remain in effect (meaning this test can be used) for the duration of the COVID-19 declaration under Section 564(b)(1) of the Act, 21 U.S.C. section 360bbb-3(b)(1), unless the authorization is terminated or revoked.     Resp Syncytial Virus by PCR NEGATIVE NEGATIVE Final    Comment: (NOTE) Fact Sheet for Patients: BloggerCourse.com  Fact Sheet for Healthcare Providers: SeriousBroker.it  This test is not  yet approved or cleared by the [place]  FDA and has been authorized for detection and/or diagnosis of SARS-CoV-2 by FDA under an Emergency Use Authorization (EUA). This EUA will remain in effect (meaning this test can be used) for the duration of the COVID-19 declaration under Section 564(b)(1) of the Act, 21 U.S.C. section 360bbb-3(b)(1), unless the authorization is terminated or revoked.  Performed at [facility], [address] [place], [place] [address]   MRSA Next Gen by PCR, Nasal     Status: Abnormal   Collection Time: 04/12/24 12:33 AM   Specimen: Nasal Mucosa; Nasal Swab  Result Value Ref Range Status   MRSA by PCR Next Gen DETECTED (A) NOT DETECTED Final    Comment: CRITICAL RESULT CALLED TO, READ BACK BY AND VERIFIED WITH: REXIE MANO RN AT 04/12/2024 0609 BY JEREMY C (NOTE) The GeneXpert MRSA Assay (FDA approved for NASAL specimens only), is one component of a comprehensive MRSA colonization surveillance program. It is not intended to diagnose MRSA infection nor to guide or monitor treatment for MRSA  infections. Test performance is not FDA approved in patients less than 59 years old. Performed at [facility], [address] [place], [place] [address]       Radiology Studies: US  RENAL Result Date: 04/15/2024 CLINICAL DATA:  Acute kidney insufficiency EXAM: RENAL / URINARY TRACT ULTRASOUND COMPLETE COMPARISON:  CT scan 04/11/2024 FINDINGS: Right Kidney: Renal measurements: 13.7 x 6.6 x 6.3 cm = volume: 298.1 mL. Mild parenchymal atrophy. No collecting system dilatation or perinephric fluid. Shadowing lower pole right-sided renal stone measuring 19 mm. Left Kidney: Renal measurements: 13.8 x 7.2 x 6.4 cm = volume: 330.3 mL. Mild parenchymal atrophy. No collecting system dilatation or perinephric fluid. There is a central cystic area measuring 3 cm which was seen on the prior CT scan as a parapelvic cyst. Bladder: Appears normal for degree of bladder distention. Other: Limited by overlapping bowel gas and soft tissue. IMPRESSION: Bilateral renal atrophy without collecting system dilatation. Left-sided parapelvic renal cyst as seen on prior CT. Right-sided renal stone. Electronically Signed   By: Ranell Bring M.D.   On: 04/15/2024 17:10   DG Chest 2 View Result Date: 04/15/2024 CLINICAL DATA:  [id number] Acute respiratory failure with hypoxia (HCC) [id number] EXAM: CHEST - 2 VIEW COMPARISON:  None available. FINDINGS: Diffuse interstitial opacities throughout both lungs. No focal airspace consolidation or pneumothorax. Small bilateral pleural effusions. Moderate cardiomegaly. Tortuous aorta with aortic atherosclerosis. No acute fracture or destructive lesions. Multilevel thoracic osteophytosis. Right PICC terminates at the cavoatrial junction. IMPRESSION: Moderate cardiomegaly with similar findings of interstitial edema. Small bilateral pleural effusions. Electronically Signed   By: Rogelia Myers M.D.   On: 04/15/2024 11:42      LOS: 5 days    Elgin Lam, MD [facility] 04/16/2024, 1:26 PM   If 7PM-7AM, please contact night-coverage [email or web address]

## 2024-04-17 ENCOUNTER — Inpatient Hospital Stay (HOSPITAL_COMMUNITY)

## 2024-04-17 DIAGNOSIS — N179 Acute kidney failure, unspecified: Secondary | ICD-10-CM | POA: Diagnosis not present

## 2024-04-17 DIAGNOSIS — J9601 Acute respiratory failure with hypoxia: Secondary | ICD-10-CM | POA: Diagnosis not present

## 2024-04-17 DIAGNOSIS — I10 Essential (primary) hypertension: Secondary | ICD-10-CM | POA: Diagnosis not present

## 2024-04-17 DIAGNOSIS — E1165 Type 2 diabetes mellitus with hyperglycemia: Secondary | ICD-10-CM | POA: Diagnosis not present

## 2024-04-17 LAB — BASIC METABOLIC PANEL WITH GFR
Anion gap: 9 (ref 5–15)
BUN: 33 mg/dL — ABNORMAL HIGH (ref 6–20)
CO2: 31 mmol/L (ref 22–32)
Calcium: 8.6 mg/dL — ABNORMAL LOW (ref 8.9–10.3)
Chloride: 97 mmol/L — ABNORMAL LOW (ref 98–111)
Creatinine, Ser: 1.2 mg/dL (ref 0.61–1.24)
GFR, Estimated: >60 mL/min (ref >60)
Glucose, Bld: 213 mg/dL — ABNORMAL HIGH (ref 70–99)
Potassium: 5 mmol/L (ref 3.5–5.1)
Sodium: 137 mmol/L (ref 135–145)

## 2024-04-17 LAB — CBC
HCT: 29.5 % — ABNORMAL LOW (ref 39.0–52.0)
Hemoglobin: 8.8 g/dL — ABNORMAL LOW (ref 13.0–17.0)
MCH: 24.4 pg — ABNORMAL LOW (ref 26.0–34.0)
MCHC: 29.8 g/dL — ABNORMAL LOW (ref 30.0–36.0)
MCV: 81.7 fL (ref 80.0–100.0)
Platelets: 339 K/uL (ref 150–400)
RBC: 3.61 MIL/uL — ABNORMAL LOW (ref 4.22–5.81)
RDW: 18.6 % — ABNORMAL HIGH (ref 11.5–15.5)
WBC: 5.9 K/uL (ref 4.0–10.5)
nRBC: 0 % (ref 0.0–0.2)

## 2024-04-17 LAB — GLUCOSE, CAPILLARY
Glucose-Capillary: 193 mg/dL — ABNORMAL HIGH (ref 70–99)
Glucose-Capillary: 280 mg/dL — ABNORMAL HIGH (ref 70–99)
Glucose-Capillary: 272 mg/dL — ABNORMAL HIGH (ref 70–99)
Glucose-Capillary: 223 mg/dL — ABNORMAL HIGH (ref 70–99)

## 2024-04-17 LAB — MAGNESIUM: Magnesium: 1.9 mg/dL (ref 1.7–2.4)

## 2024-04-17 MED ORDER — FUROSEMIDE 10 MG/ML IJ SOLN
40.0000 mg | Freq: Once | INTRAMUSCULAR | Status: AC
  Administered 2024-04-17: 40 mg via INTRAVENOUS
  Filled 2024-04-17: qty 4

## 2024-04-17 NOTE — Progress Notes (Signed)
 PROGRESS NOTE    Brett White  FMW:[id number] DOB: Aug 15, 1961 DOA: 04/11/2024 PCP: [facility]   Brief Narrative: Brett White is a 63 y.o. male with a history of hypertension, hyperlipidemia, diabetes mellitus, depression, anxiety, insomnia, IBS, chronic pain syndrome, left BKA, ESBL UTI.  Patient presented secondary to fatigue, malaise, nausea, vomiting and flank pain and was found to have evidence of sepsis secondary to UTI, in addition to AKI and acute respiratory failure. Empiric antibiotics started. No etiology for respiratory failure identified.   Assessment and Plan:  Acute respiratory failure with hypoxia Unclear etiology. CT chest without clear etiology. V/Q scan obtained and was with no evidence of acute PE. Patient requiring as much as 10 L/min of supplemental oxygen. Repeat chest x-ray )7/15) significant for evidence of pulmonary edema, which could be contributory. Patient given Lasix  80 mg IV x1 with resultant worsening renal function. Transthoracic Echocardiogram obtained on 7/17 suggests right ventricular dysfunction. Weaned down to room air today with continued diuresis. -Incentive spirometry -Will consider consulting cardiology in the AM  Severe sepsis Patient on admission.  Secondary to UTI.  Patient started empirically on vancomycin , ceftriaxone , azithromycin  and transition to meropenem  for ESBL coverage.  Blood cultures with no growth.  ESBL E. coli UTI Present on admission.  Urine culture significant for ESBL E. coli. Discussed with ID, who recommends continuing meropenem  until 7/19. -Continue meropenem   Right flank pain Continued and not improved. Patient reports it feeling like barbs. States he does not think it is muscle related. Renal ultrasound without abnormality to explain. Repeat CT shows edema and stranding involving the right kidney and ureter and no mention of previously seen stone. Possible patient passed the stone since admission,  causing his symptoms.  AKI on CKD stage II Baseline creatinine appears to be around 1.24.  Creatinine of 2.70 on admission in the setting of sepsis.  Patient managed with IV fluids initially with worsening respiratory status and worsening renal function. Function improved after starting Lasix  diuresis.  Leukocytosis Secondary to acute infection.  Resolved.  Anemia of chronic disease Baseline hemoglobin appears to be around 12. Acute drop this admission of unknown significant or etiology. No evidence of hemorrhaging. Patient with CT chest abdomen and pelvis without evidence for hematoma. Stable.  Primary hypertension -Continue amlodipine   Hyperlipidemia -Continue Lipitor 40 mg daily  Hyponatremia Mild.  Stable.  Diabetes mellitus type 2 Uncontrolled based off last hemoglobin A1c of 8.1%.  Patient is on glipizide , Humalog, Farxiga , Trulicity  as an outpatient for management. - Continue SSI  BPH - Continue tamsulosin   Anxiety Depression - Continue Cymbalta  - Continue Zoloft   Ascending aorta dilation Noted on Transthoracic Echocardiogram measuring 42 mm, in addition to dilation of the aortic root measuring 43 mm.  Chronic pain syndrome Opiate dependence Noted. - Continue Lyrica  - Continue oxycodone  as needed  Obesity, class II Estimated body mass index is 36.63 kg/m as calculated from the following:   Height as of this encounter: 6' 2 (1.88 m).   Weight as of this encounter: 129.4 kg.   DVT prophylaxis: Subcutaneous heparin  Code Status:   Code Status: Full Code Family Communication: None at bedside Disposition Plan: Discharge home once patient has completed IV antibiotic course and ability to wean oxygen to room air   Consultants:  None  Procedures:  Transthoracic Echocardiogram (pending)  Antimicrobials: Vancomycin  Ceftriaxone  Azithromycin  Meropenem     Subjective: Patient reports feeling generally bad. No other issues.  Objective: BP (!) 144/80 (BP  Location: Right Arm)  Pulse 97   Temp 98 F (36.7 C)   Resp 17   Ht 6' 2 (1.88 m)   Wt 129.4 kg   SpO2 90%   BMI 36.63 kg/m   Examination:  General exam: Appears calm and comfortable Respiratory system: Diminished. Respiratory effort normal. Cardiovascular system: S1 & S2 heard, RRR. No murmurs, rubs, gallops or clicks. Gastrointestinal system: Abdomen is obese, soft and nontender.  Normal bowel sounds heard. Central nervous system: Alert and oriented. No focal neurological deficits. Psychiatry: Judgement and insight appear normal. Mood & affect appropriate.    Data Reviewed: I have personally reviewed following labs and imaging studies  CBC Lab Results  Component Value Date   WBC 5.9 04/17/2024   RBC 3.61 (L) 04/17/2024   HGB 8.8 (L) 04/17/2024   HCT 29.5 (L) 04/17/2024   MCV 81.7 04/17/2024   MCH 24.4 (L) 04/17/2024   PLT 339 04/17/2024   MCHC 29.8 (L) 04/17/2024   RDW 18.6 (H) 04/17/2024   LYMPHSABS 0.9 04/15/2024   MONOABS 0.6 04/15/2024   EOSABS 0.1 04/15/2024   BASOSABS 0.0 04/15/2024     Last metabolic panel Lab Results  Component Value Date   NA 137 04/17/2024   K 5.0 04/17/2024   CL 97 (L) 04/17/2024   CO2 31 04/17/2024   BUN 33 (H) 04/17/2024   CREATININE 1.20 04/17/2024   GLUCOSE 213 (H) 04/17/2024   GFRNONAA >60 04/17/2024   CALCIUM  8.6 (L) 04/17/2024   PHOS 3.6 02/16/2022   PROT 5.9 (L) 04/15/2024   ALBUMIN  2.3 (L) 04/15/2024   BILITOT 0.5 04/15/2024   ALKPHOS 96 04/15/2024   AST 27 04/15/2024   ALT 25 04/15/2024   ANIONGAP 9 04/17/2024    GFR: Estimated Creatinine Clearance: 93.6 mL/min (by C-G formula based on SCr of 1.2 mg/dL).  Recent Results (from the past 240 hours)  Urine Culture     Status: Abnormal   Collection Time: 04/11/24  3:31 PM   Specimen: Urine, Random  Result Value Ref Range Status   Specimen Description   Final    URINE, RANDOM Performed at [facility], [address], [place], [place]  [address]    Special Requests   Final    NONE Reflexed from [id number] Performed at [facility], [address], [place], [place] [address]    Culture (A)  Final    80,000 COLONIES/mL ESCHERICHIA COLI Confirmed Extended Spectrum Beta-Lactamase Producer (ESBL).  In bloodstream infections from ESBL organisms, carbapenems are preferred over piperacillin /tazobactam. They are shown to have a lower risk of mortality.    Report Status 04/14/2024 FINAL  Final   Organism ID, Bacteria ESCHERICHIA COLI (A)  Final      Susceptibility   Escherichia coli - MIC*    AMPICILLIN  >=32 RESISTANT Resistant     CEFAZOLIN  >=64 RESISTANT Resistant     CEFEPIME  >=32 RESISTANT Resistant     CEFTRIAXONE  >=64 RESISTANT Resistant     CIPROFLOXACIN >=4 RESISTANT Resistant     GENTAMICIN  <=1 SENSITIVE Sensitive     IMIPENEM <=0.25 SENSITIVE Sensitive     NITROFURANTOIN <=16 SENSITIVE Sensitive     TRIMETH /SULFA  >=320 RESISTANT Resistant     AMPICILLIN /SULBACTAM >=32 RESISTANT Resistant     PIP/TAZO 8 SENSITIVE Sensitive ug/mL    * 80,000 COLONIES/mL ESCHERICHIA COLI  Blood Culture (routine x 2)     Status: None   Collection Time: 04/11/24  5:40 PM   Specimen: BLOOD LEFT FOREARM  Result Value Ref  Range Status   Specimen Description   Final    BLOOD LEFT FOREARM Performed at [facility], [address] [place], [place] [address]    Special Requests   Final    BOTTLES DRAWN AEROBIC AND ANAEROBIC Blood Culture results may not be optimal due to an inadequate volume of blood received in culture bottles Performed at [facility], [address] [place], [place] [address]    Culture   Final    NO GROWTH 5 DAYS Performed at [facility], [address], [place], [place] [address]    Report Status 04/16/2024 FINAL  Final  Blood Culture (routine x 2)     Status: None   Collection Time: 04/11/24  5:46 PM   Specimen: BLOOD LEFT HAND  Result Value Ref Range Status    Specimen Description BLOOD LEFT HAND  Final   Special Requests   Final    BOTTLES DRAWN AEROBIC AND ANAEROBIC Blood Culture adequate volume   Culture   Final    NO GROWTH 5 DAYS Performed at [facility], [address] [place], [place] [address]    Report Status 04/16/2024 FINAL  Final  Resp panel by RT-PCR (RSV, Flu A&B, Covid) Anterior Nasal Swab     Status: None   Collection Time: 04/11/24  6:50 PM   Specimen: Anterior Nasal Swab  Result Value Ref Range Status   SARS Coronavirus 2 by RT PCR NEGATIVE NEGATIVE Final    Comment: (NOTE) SARS-CoV-2 target nucleic acids are NOT DETECTED.  The SARS-CoV-2 RNA is generally detectable in upper respiratory specimens during the acute phase of infection. The lowest concentration of SARS-CoV-2 viral copies this assay can detect is 138 copies/mL. A negative result does not preclude SARS-Cov-2 infection and should not be used as the sole basis for treatment or other patient management decisions. A negative result may occur with  improper specimen collection/handling, submission of specimen other than nasopharyngeal swab, presence of viral mutation(s) within the areas targeted by this assay, and inadequate number of viral copies(<138 copies/mL). A negative result must be combined with clinical observations, patient history, and epidemiological information. The expected result is Negative.  Fact Sheet for Patients:  BloggerCourse.com  Fact Sheet for Healthcare Providers:  SeriousBroker.it  This test is no t yet approved or cleared by the [place]  FDA and  has been authorized for detection and/or diagnosis of SARS-CoV-2 by FDA under an Emergency Use Authorization (EUA). This EUA will remain  in effect (meaning this test can be used) for the duration of the COVID-19 declaration under Section 564(b)(1) of the Act, 21 U.S.C.section 360bbb-3(b)(1), unless the authorization is  terminated  or revoked sooner.       Influenza A by PCR NEGATIVE NEGATIVE Final   Influenza B by PCR NEGATIVE NEGATIVE Final    Comment: (NOTE) The Xpert Xpress SARS-CoV-2/FLU/RSV plus assay is intended as an aid in the diagnosis of influenza from Nasopharyngeal swab specimens and should not be used as a sole basis for treatment. Nasal washings and aspirates are unacceptable for Xpert Xpress SARS-CoV-2/FLU/RSV testing.  Fact Sheet for Patients: BloggerCourse.com  Fact Sheet for Healthcare Providers: SeriousBroker.it  This test is not yet approved or cleared by the [place]  FDA and has been authorized for detection and/or diagnosis of SARS-CoV-2 by FDA under an Emergency Use Authorization (EUA). This EUA will remain in effect (meaning this test can be used) for the duration of the COVID-19 declaration under Section  564(b)(1) of the Act, 21 U.S.C. section 360bbb-3(b)(1), unless the authorization is terminated or revoked.     Resp Syncytial Virus by PCR NEGATIVE NEGATIVE Final    Comment: (NOTE) Fact Sheet for Patients: BloggerCourse.com  Fact Sheet for Healthcare Providers: SeriousBroker.it  This test is not yet approved or cleared by the [place]  FDA and has been authorized for detection and/or diagnosis of SARS-CoV-2 by FDA under an Emergency Use Authorization (EUA). This EUA will remain in effect (meaning this test can be used) for the duration of the COVID-19 declaration under Section 564(b)(1) of the Act, 21 U.S.C. section 360bbb-3(b)(1), unless the authorization is terminated or revoked.  Performed at [facility], [address] [place], [place] [address]   MRSA Next Gen by PCR, Nasal     Status: Abnormal   Collection Time: 04/12/24 12:33 AM   Specimen: Nasal Mucosa; Nasal Swab  Result Value Ref Range Status   MRSA by PCR Next Gen  DETECTED (A) NOT DETECTED Final    Comment: CRITICAL RESULT CALLED TO, READ BACK BY AND VERIFIED WITH: REXIE MANO RN AT 04/12/2024 0609 BY JEREMY C (NOTE) The GeneXpert MRSA Assay (FDA approved for NASAL specimens only), is one component of a comprehensive MRSA colonization surveillance program. It is not intended to diagnose MRSA infection nor to guide or monitor treatment for MRSA infections. Test performance is not FDA approved in patients less than 24 years old. Performed at [facility], [address] [place], [place] [address]       Radiology Studies: CT RENAL STONE STUDY Result Date: 04/17/2024 CLINICAL DATA:  Right flank pain. EXAM: CT ABDOMEN AND PELVIS WITHOUT CONTRAST TECHNIQUE: Multidetector CT imaging of the abdomen and pelvis was performed following the standard protocol without IV contrast. RADIATION DOSE REDUCTION: This exam was performed according to the departmental dose-optimization program which includes automated exposure control, adjustment of the mA and/or kV according to patient size and/or use of iterative reconstruction technique. COMPARISON:  Renal ultrasound 04/15/2024 and CT abdomen pelvis 03/28/2024. FINDINGS: Lower chest: Dependent and scattered subsegmental atelectasis. Small bilateral pleural effusions. Heart is enlarged. No pericardial effusion. Distal esophagus is grossly unremarkable. Hepatobiliary: Liver and gallbladder are unremarkable. No biliary ductal dilatation. Pancreas: Negative. Spleen: Negative. Adrenals/Urinary Tract: Adrenal glands are unremarkable. Bilateral renal stones. Small low-attenuation lesion in the left kidney. No specific follow-up necessary. Increased bilateral perinephric edema with new stranding along the mid/distal ureters bilaterally. Ureters are decompressed. Bladder is grossly unremarkable. Stomach/Bowel: Stomach and small bowel are unremarkable. Appendix is not readily visualized. Stool throughout the colon.  Vascular/Lymphatic: Atherosclerotic calcification of the aorta. No pathologically enlarged lymph nodes. Reproductive: Prostate is visualized. Other: No free fluid. Tiny umbilical hernia contains fat. Mesenteries and peritoneum are otherwise unremarkable. Musculoskeletal: Degenerative changes in the spine. Interbody cages at L3-4 and L4-5. IMPRESSION: 1. Mild perinephric edema with mild stranding along the mid/distal ureters, new from 03/28/2024. No ureteral or bladder stones nor hydronephrosis. Please correlate with laboratory values for the possibility of pyelonephritis. 2. Bilateral renal stones. 3. Small bilateral pleural effusions with dependent and scattered subsegmental atelectasis. 4.  Aortic atherosclerosis (ICD10-I70.0). Electronically Signed   By: Newell Eke M.D.   On: 04/17/2024 09:08   ECHOCARDIOGRAM COMPLETE Result Date: 04/16/2024    ECHOCARDIOGRAM REPORT   Patient Name:   FRITZ CAUTHON Date of Exam: 04/16/2024 Medical Rec #:  [id number]      Height:       74.0 in Accession #:    [id number]  Weight:       280.6 lb Date of Birth:  1961/06/29      BSA:          2.510 m Patient Age:    62 years       BP:           86/59 mmHg Patient Gender: M              HR:           88 bpm. Exam Location:  Inpatient Procedure: 2D Echo, Cardiac Doppler and Color Doppler (Both Spectral and Color            Flow Doppler were utilized during procedure). Indications:    I50.40* Unspecified combined systolic (congestive) and diastolic                 (congestive) heart failure  History:        Patient has no prior history of Echocardiogram examinations.                 Abnormal ECG, Arrythmias:Tachycardia, Signs/Symptoms:Dyspnea and                 Shortness of Breath; Risk Factors:Hypertension, Dyslipidemia and                 Diabetes.  Sonographer:    Ellouise Mose RDCS Referring Phys: [phone] Aletha Allebach A Zyaire Dumas  Sonographer Comments: Technically difficult study due to poor echo windows and patient is obese. Image  acquisition challenging due to patient body habitus. IMPRESSIONS  1. Left ventricular ejection fraction, by estimation, is 55 to 60%. The left ventricle has normal function. The left ventricle has no regional wall motion abnormalities. Left ventricular diastolic parameters are indeterminate.  2. Right ventricular systolic function is low normal. The right ventricular size is moderately enlarged. Tricuspid regurgitation signal is inadequate for assessing PA pressure.  3. The mitral valve is normal in structure. Trivial mitral valve regurgitation. No evidence of mitral stenosis.  4. The aortic valve is tricuspid. Aortic valve regurgitation is not visualized. No aortic stenosis is present.  5. Aortic dilatation noted. There is dilatation of the ascending aorta, measuring 42 mm. There is dilatation of the aortic root, measuring 43 mm.  6. The inferior vena cava is dilated in size with <50% respiratory variability, suggesting right atrial pressure of 15 mmHg. Comparison(s): No prior Echocardiogram. FINDINGS  Left Ventricle: Left ventricular ejection fraction, by estimation, is 55 to 60%. The left ventricle has normal function. The left ventricle has no regional wall motion abnormalities. The left ventricular internal cavity size was normal in size. There is  no left ventricular hypertrophy. Left ventricular diastolic parameters are indeterminate. Right Ventricle: The right ventricular size is moderately enlarged. No increase in right ventricular wall thickness. Right ventricular systolic function is low normal. Tricuspid regurgitation signal is inadequate for assessing PA pressure. Left Atrium: Left atrial size was normal in size. Right Atrium: Right atrial size was normal in size. Pericardium: There is no evidence of pericardial effusion. Mitral Valve: The mitral valve is normal in structure. Trivial mitral valve regurgitation. No evidence of mitral valve stenosis. Tricuspid Valve: The tricuspid valve is normal in  structure. Tricuspid valve regurgitation is trivial. No evidence of tricuspid stenosis. Aortic Valve: The aortic valve is tricuspid. Aortic valve regurgitation is not visualized. No aortic stenosis is present. Pulmonic Valve: The pulmonic valve was normal in structure. Pulmonic valve regurgitation is not visualized. No evidence of pulmonic stenosis. Aorta: Aortic dilatation  noted. There is dilatation of the ascending aorta, measuring 42 mm. There is dilatation of the aortic root, measuring 43 mm. Venous: The inferior vena cava is dilated in size with less than 50% respiratory variability, suggesting right atrial pressure of 15 mmHg. IAS/Shunts: There is right bowing of the interatrial septum, suggestive of elevated left atrial pressure. No atrial level shunt detected by color flow Doppler.  LEFT VENTRICLE PLAX 2D LVIDd:         5.00 cm      Diastology LVIDs:         3.70 cm      LV e' medial:    6.85 cm/s LV PW:         1.40 cm      LV E/e' medial:  13.6 LV IVS:        1.30 cm      LV e' lateral:   7.94 cm/s LVOT diam:     2.40 cm      LV E/e' lateral: 11.8 LV SV:         81 LV SV Index:   32 LVOT Area:     4.52 cm  LV Volumes (MOD) LV vol d, MOD A2C: 81.7 ml LV vol d, MOD A4C: 100.0 ml LV vol s, MOD A2C: 37.1 ml LV vol s, MOD A4C: 46.5 ml LV SV MOD A2C:     44.6 ml LV SV MOD A4C:     100.0 ml LV SV MOD BP:      48.5 ml RIGHT VENTRICLE             IVC RV S prime:     12.24 cm/s  IVC diam: 2.40 cm TAPSE (M-mode): 1.7 cm LEFT ATRIUM             Index       RIGHT ATRIUM           Index LA diam:        2.80 cm 1.12 cm/m  RA Area:     13.70 cm LA Vol (A2C):   22.0 ml 8.76 ml/m  RA Volume:   29.00 ml  11.55 ml/m LA Vol (A4C):   23.0 ml 9.16 ml/m LA Biplane Vol: 23.8 ml 9.48 ml/m  AORTIC VALVE LVOT Vmax:   99.20 cm/s LVOT Vmean:  62.400 cm/s LVOT VTI:    0.179 m  AORTA Ao Root diam: 4.30 cm Ao Asc diam:  4.10 cm MITRAL VALVE MV Area (PHT): 4.30 cm    SHUNTS MV Decel Time: 177 msec    Systemic VTI:  0.18 m MV E  velocity: 93.45 cm/s  Systemic Diam: 2.40 cm MV A velocity: 97.40 cm/s MV E/A ratio:  0.96 Morene Brownie Electronically signed by Morene Brownie Signature Date/Time: 04/16/2024/3:35:42 PM    Final    US  RENAL Result Date: 04/15/2024 CLINICAL DATA:  Acute kidney insufficiency EXAM: RENAL / URINARY TRACT ULTRASOUND COMPLETE COMPARISON:  CT scan 04/11/2024 FINDINGS: Right Kidney: Renal measurements: 13.7 x 6.6 x 6.3 cm = volume: 298.1 mL. Mild parenchymal atrophy. No collecting system dilatation or perinephric fluid. Shadowing lower pole right-sided renal stone measuring 19 mm. Left Kidney: Renal measurements: 13.8 x 7.2 x 6.4 cm = volume: 330.3 mL. Mild parenchymal atrophy. No collecting system dilatation or perinephric fluid. There is a central cystic area measuring 3 cm which was seen on the prior CT scan as a parapelvic cyst. Bladder: Appears normal for degree of bladder distention. Other: Limited by overlapping bowel gas  and soft tissue. IMPRESSION: Bilateral renal atrophy without collecting system dilatation. Left-sided parapelvic renal cyst as seen on prior CT. Right-sided renal stone. Electronically Signed   By: Ranell Bring M.D.   On: 04/15/2024 17:10      LOS: 6 days    Elgin Lam, MD [facility] 04/17/2024, 12:49 PM   If 7PM-7AM, please contact night-coverage [email or web address]

## 2024-04-17 NOTE — Progress Notes (Signed)
 PT Cancellation Note  Patient Details Name: Fredis Malkiewicz MRN: [id number] DOB: 12-17-1960   Cancelled Treatment:    Reason Eval/Treat Not Completed: Other (comment) Pt declined due to pain.  He was up in chair earlier.  Will f/u later date. Estie Sproule, PT Acute Rehab [facility] [phone]   Benjiman VEAR Mulberry 04/17/2024, 4:32 PM

## 2024-04-17 NOTE — Plan of Care (Signed)
 Patient with pyelo and esbl ecoli on urine culture without oral options for deep infection Bcx negative Meropenem  started 7/12   Leukocytosis had resolved within 24 hours admission and no sign of sepsis or other complication  Hx renal stone and ct suggest recent passing of a stone    -finish 1 more day abx for a 7 day course total by tomorrow -no need for id clinic f/u -urology f/u if being seen for urinary stone

## 2024-04-17 NOTE — Inpatient Diabetes Management (Signed)
 Inpatient Diabetes Program Recommendations  AACE/ADA: New Consensus Statement on Inpatient Glycemic Control (2015)  Target Ranges:  Prepandial:   less than 140 mg/dL      Peak postprandial:   less than 180 mg/dL (1-2 hours)      Critically ill patients:  140 - 180 mg/dL   Lab Results  Component Value Date   GLUCAP 280 (H) 04/17/2024   HGBA1C 8.1 (H) 11/21/2023    Review of Glycemic Control  Outpatient Diabetes medications:  Trulicity  0.5 mg weekly Glipizide  10 mg QAM Humalog 2-8 units TID Farxiga  10 mg every day   Current orders for Inpatient glycemic control:  Novolog  0-6 units TID and 0-5 units at bedtime, Semglee  10 units every day, Novolog  2 units TID   Inpatient Diabetes Program Recommendations:    Novolog  4 units TID with meals if he consumes at least 50%   Thank you, Wyvonna Pinal, MSN, CDCES Diabetes Coordinator Inpatient Diabetes Program [phone] (team pager from 8a-5p)

## 2024-04-17 NOTE — Plan of Care (Signed)

## 2024-04-18 DIAGNOSIS — E1165 Type 2 diabetes mellitus with hyperglycemia: Secondary | ICD-10-CM | POA: Diagnosis not present

## 2024-04-18 DIAGNOSIS — I10 Essential (primary) hypertension: Secondary | ICD-10-CM | POA: Diagnosis not present

## 2024-04-18 DIAGNOSIS — J9601 Acute respiratory failure with hypoxia: Secondary | ICD-10-CM | POA: Diagnosis not present

## 2024-04-18 DIAGNOSIS — N179 Acute kidney failure, unspecified: Secondary | ICD-10-CM | POA: Diagnosis not present

## 2024-04-18 LAB — BASIC METABOLIC PANEL WITH GFR
Anion gap: 9 (ref 5–15)
BUN: 27 mg/dL — ABNORMAL HIGH (ref 6–20)
CO2: 31 mmol/L (ref 22–32)
Calcium: 8.6 mg/dL — ABNORMAL LOW (ref 8.9–10.3)
Chloride: 98 mmol/L (ref 98–111)
Creatinine, Ser: 0.96 mg/dL (ref 0.61–1.24)
GFR, Estimated: >60 mL/min (ref >60)
Glucose, Bld: 196 mg/dL — ABNORMAL HIGH (ref 70–99)
Potassium: 4.8 mmol/L (ref 3.5–5.1)
Sodium: 138 mmol/L (ref 135–145)

## 2024-04-18 LAB — GLUCOSE, CAPILLARY
Glucose-Capillary: 216 mg/dL — ABNORMAL HIGH (ref 70–99)
Glucose-Capillary: 274 mg/dL — ABNORMAL HIGH (ref 70–99)
Glucose-Capillary: 294 mg/dL — ABNORMAL HIGH (ref 70–99)
Glucose-Capillary: 276 mg/dL — ABNORMAL HIGH (ref 70–99)

## 2024-04-18 LAB — MAGNESIUM: Magnesium: 1.7 mg/dL (ref 1.7–2.4)

## 2024-04-18 MED ORDER — FUROSEMIDE 10 MG/ML IJ SOLN
40.0000 mg | Freq: Once | INTRAMUSCULAR | Status: AC
  Administered 2024-04-18: 40 mg via INTRAVENOUS
  Filled 2024-04-18: qty 4

## 2024-04-18 NOTE — TOC Progression Note (Signed)
 Transition of Care [facility]) - Progression Note    Patient Details  Name: Brett White MRN: [id number] Date of Birth: 1960/10/04  Transition of Care [facility]) CM/SW Contact  Heather DELENA Saltness, LCSW Phone Number: 04/18/2024, 11:33 AM  Clinical Narrative:    CSW spoke with Glenys at [facility] who confirmed pt's ability to return to LTC tomorrow. TOC will continue to follow.   Expected Discharge Plan: Long Term Nursing Home Barriers to Discharge: Continued Medical Work up  Expected Discharge Plan and Services In-house Referral: Clinical Social Work Discharge Planning Services: CM Consult Post Acute Care Choice: Nursing Home Living arrangements for the past 2 months: Skilled Nursing Facility                 DME Arranged: N/A DME Agency: NA       HH Arranged: NA HH Agency: NA         Social Determinants of Health (SDOH) Interventions SDOH Screenings   Food Insecurity: No Food Insecurity (04/12/2024)  Housing: Low Risk  (04/12/2024)  Transportation Needs: No Transportation Needs (04/12/2024)  Utilities: Not At Risk (04/12/2024)  Tobacco Use: Low Risk  (04/11/2024)    Readmission Risk Interventions    04/13/2024    3:04 PM 11/16/2022    2:57 PM  Readmission Risk Prevention Plan  Transportation Screening Complete Complete  Medication Review (RN Care Manager) Complete Complete  PCP or Specialist appointment within 3-5 days of discharge Complete Complete  HRI or Home Care Consult Complete Complete  SW Recovery Care/Counseling Consult Complete Complete  Palliative Care Screening Complete Complete  Skilled Nursing Facility Complete Complete

## 2024-04-18 NOTE — Progress Notes (Addendum)
 PROGRESS NOTE    Brett White  FMW:[id number] DOB: 1960/11/24 DOA: 04/11/2024 PCP: [facility]   Brief Narrative: Brett White is a 63 y.o. male with a history of hypertension, hyperlipidemia, diabetes mellitus, depression, anxiety, insomnia, IBS, chronic pain syndrome, left BKA, ESBL UTI.  Patient presented secondary to fatigue, malaise, nausea, vomiting and flank pain and was found to have evidence of sepsis secondary to UTI, in addition to AKI and acute respiratory failure. Empiric antibiotics started. No etiology for respiratory failure identified.   Assessment and Plan:  Acute respiratory failure with hypoxia Unclear etiology. CT chest without clear etiology. V/Q scan obtained and was with no evidence of acute PE. Patient requiring as much as 10 L/min of supplemental oxygen. Repeat chest x-ray )7/15) significant for evidence of pulmonary edema, which could be contributory. Patient given Lasix  80 mg IV x1 with resultant worsening renal function. Transthoracic Echocardiogram obtained on 7/17 suggests right ventricular dysfunction. Weaned down to room air today with continued diuresis. Discussed with cardiology, Dr. Francyne; unlikely RV dysfunction but with LVH, more likely diastolic heart dysfunction. Recommendation to resume lasix  as an outpatient and follow-up with outpatient cardiology. -Incentive spirometry  Severe sepsis Patient on admission.  Secondary to UTI.  Patient started empirically on vancomycin , ceftriaxone , azithromycin  and transition to meropenem  for ESBL coverage.  Blood cultures with no growth.  ESBL E. coli UTI Pyelonephritis Present on admission.  Urine culture significant for ESBL E. coli. Discussed with ID, who recommends continuing meropenem  until 7/19. Repeat CT suggests evidence of pyelonephritis. ID recommendation to complete meropenem  course with end date of 7/19. -Continue meropenem   Right flank pain Continued and not improved. Patient  reports it feeling like barbs. States he does not think it is muscle related. Renal ultrasound without abnormality to explain. Repeat CT shows edema and stranding involving the right kidney and ureter and no mention of previously seen stone. Possible patient passed the stone since admission, causing his symptoms.  AKI on CKD stage II Baseline creatinine appears to be around 1.24.  Creatinine of 2.70 on admission in the setting of sepsis.  Patient managed with IV fluids initially with worsening respiratory status and worsening renal function. Function improved after starting Lasix  diuresis.  Leukocytosis Secondary to acute infection.  Resolved.  Anemia of chronic disease Baseline hemoglobin appears to be around 12. Acute drop this admission of unknown significant or etiology. No evidence of hemorrhaging. Patient with CT chest abdomen and pelvis without evidence for hematoma. Stable.  Primary hypertension -Continue amlodipine   Hyperlipidemia -Continue Lipitor 40 mg daily  Hyponatremia Mild.  Stable.  Diabetes mellitus type 2 Uncontrolled based off last hemoglobin A1c of 8.1%.  Patient is on glipizide , Humalog, Farxiga , Trulicity  as an outpatient for management. - Continue SSI  BPH - Continue tamsulosin   Anxiety Depression - Continue Cymbalta  - Continue Zoloft   Ascending aorta dilation Noted on Transthoracic Echocardiogram measuring 42 mm, in addition to dilation of the aortic root measuring 43 mm.  Chronic pain syndrome Opiate dependence Noted. - Continue Lyrica  - Continue oxycodone  as needed  Obesity, class II Estimated body mass index is 36.63 kg/m as calculated from the following:   Height as of this encounter: 6' 2 (1.88 m).   Weight as of this encounter: 129.4 kg.   DVT prophylaxis: Subcutaneous heparin  Code Status:   Code Status: Full Code Family Communication: None at bedside Disposition Plan: Discharge home once patient has completed IV antibiotic course and  ability to wean oxygen to room air. Anticipate  discharge back to facility in 24 hours   Consultants:  Cardiology Infectious disease  Procedures:  Transthoracic Echocardiogram (pending)  Antimicrobials: Vancomycin  Ceftriaxone  Azithromycin  Meropenem     Subjective: Still with right flank pain. No other concerns.  Objective: BP (!) 172/92   Pulse 95   Temp 98.3 F (36.8 C)   Resp 20   Ht 6' 2 (1.88 m)   Wt 129.4 kg   SpO2 96%   BMI 36.63 kg/m   Examination:  General exam: Appears calm and comfortable Respiratory system: Clear to auscultation. Respiratory effort normal. Cardiovascular system: S1 & S2 heard, RRR. No murmurs, rubs, gallops or clicks. Gastrointestinal system: Abdomen is nondistended, soft and nontender. Normal bowel sounds heard. Central nervous system: Alert and oriented. No focal neurological deficits. Psychiatry: Judgement and insight appear normal. Mood & affect appropriate.    Data Reviewed: I have personally reviewed following labs and imaging studies  CBC Lab Results  Component Value Date   WBC 5.9 04/17/2024   RBC 3.61 (L) 04/17/2024   HGB 8.8 (L) 04/17/2024   HCT 29.5 (L) 04/17/2024   MCV 81.7 04/17/2024   MCH 24.4 (L) 04/17/2024   PLT 339 04/17/2024   MCHC 29.8 (L) 04/17/2024   RDW 18.6 (H) 04/17/2024   LYMPHSABS 0.9 04/15/2024   MONOABS 0.6 04/15/2024   EOSABS 0.1 04/15/2024   BASOSABS 0.0 04/15/2024     Last metabolic panel Lab Results  Component Value Date   NA 138 04/18/2024   K 4.8 04/18/2024   CL 98 04/18/2024   CO2 31 04/18/2024   BUN 27 (H) 04/18/2024   CREATININE 0.96 04/18/2024   GLUCOSE 196 (H) 04/18/2024   GFRNONAA >60 04/18/2024   CALCIUM  8.6 (L) 04/18/2024   PHOS 3.6 02/16/2022   PROT 5.9 (L) 04/15/2024   ALBUMIN  2.3 (L) 04/15/2024   BILITOT 0.5 04/15/2024   ALKPHOS 96 04/15/2024   AST 27 04/15/2024   ALT 25 04/15/2024   ANIONGAP 9 04/18/2024    GFR: Estimated Creatinine Clearance: 117 mL/min (by  C-G formula based on SCr of 0.96 mg/dL).  Recent Results (from the past 240 hours)  Urine Culture     Status: Abnormal   Collection Time: 04/11/24  3:31 PM   Specimen: Urine, Random  Result Value Ref Range Status   Specimen Description   Final    URINE, RANDOM Performed at [facility], [address], [place], [place] [address]    Special Requests   Final    NONE Reflexed from [id number] Performed at [facility], [address], [place], [place] [address]    Culture (A)  Final    80,000 COLONIES/mL ESCHERICHIA COLI Confirmed Extended Spectrum Beta-Lactamase Producer (ESBL).  In bloodstream infections from ESBL organisms, carbapenems are preferred over piperacillin /tazobactam. They are shown to have a lower risk of mortality.    Report Status 04/14/2024 FINAL  Final   Organism ID, Bacteria ESCHERICHIA COLI (A)  Final      Susceptibility   Escherichia coli - MIC*    AMPICILLIN  >=32 RESISTANT Resistant     CEFAZOLIN  >=64 RESISTANT Resistant     CEFEPIME  >=32 RESISTANT Resistant     CEFTRIAXONE  >=64 RESISTANT Resistant     CIPROFLOXACIN >=4 RESISTANT Resistant     GENTAMICIN  <=1 SENSITIVE Sensitive     IMIPENEM <=0.25 SENSITIVE Sensitive     NITROFURANTOIN <=16 SENSITIVE Sensitive     TRIMETH /SULFA  >=320 RESISTANT Resistant     AMPICILLIN /SULBACTAM >=32 RESISTANT Resistant  PIP/TAZO 8 SENSITIVE Sensitive ug/mL    * 80,000 COLONIES/mL ESCHERICHIA COLI  Blood Culture (routine x 2)     Status: None   Collection Time: 04/11/24  5:40 PM   Specimen: BLOOD LEFT FOREARM  Result Value Ref Range Status   Specimen Description   Final    BLOOD LEFT FOREARM Performed at [facility], [address] [place], [place] [address]    Special Requests   Final    BOTTLES DRAWN AEROBIC AND ANAEROBIC Blood Culture results may not be optimal due to an inadequate volume of blood received in culture bottles Performed at [facility], [address] [place], [place] [address]    Culture   Final    NO GROWTH 5 DAYS Performed at [facility], [address], [place], [place] [address]    Report Status 04/16/2024 FINAL  Final  Blood Culture (routine x 2)     Status: None   Collection Time: 04/11/24  5:46 PM   Specimen: BLOOD LEFT HAND  Result Value Ref Range Status   Specimen Description BLOOD LEFT HAND  Final   Special Requests   Final    BOTTLES DRAWN AEROBIC AND ANAEROBIC Blood Culture adequate volume   Culture   Final    NO GROWTH 5 DAYS Performed at [facility], [address] [place], [place] [address]    Report Status 04/16/2024 FINAL  Final  Resp panel by RT-PCR (RSV, Flu A&B, Covid) Anterior Nasal Swab     Status: None   Collection Time: 04/11/24  6:50 PM   Specimen: Anterior Nasal Swab  Result Value Ref Range Status   SARS Coronavirus 2 by RT PCR NEGATIVE NEGATIVE Final    Comment: (NOTE) SARS-CoV-2 target nucleic acids are NOT DETECTED.  The SARS-CoV-2 RNA is generally detectable in upper respiratory specimens during the acute phase of infection. The lowest concentration of SARS-CoV-2 viral copies this assay can detect is 138 copies/mL. A negative result does not preclude SARS-Cov-2 infection and should not be used as the sole basis for treatment or other patient management decisions. A negative result may occur with  improper specimen collection/handling, submission of specimen other than nasopharyngeal swab, presence of viral mutation(s) within the areas targeted by this assay, and inadequate number of viral copies(<138 copies/mL). A negative result must be combined with clinical observations, patient history, and epidemiological information. The expected result is Negative.  Fact Sheet for Patients:  BloggerCourse.com  Fact Sheet for Healthcare Providers:  SeriousBroker.it  This test is no t yet approved or cleared by the [place] FDA and  has been authorized for detection and/or diagnosis of SARS-CoV-2 by FDA under an Emergency Use Authorization (EUA). This EUA will remain  in effect (meaning this test can be used) for the duration of the COVID-19 declaration under Section 564(b)(1) of the Act, 21 U.S.C.section 360bbb-3(b)(1), unless the authorization is terminated  or revoked sooner.       Influenza A by PCR NEGATIVE NEGATIVE Final   Influenza B by PCR NEGATIVE NEGATIVE Final    Comment: (NOTE) The Xpert Xpress SARS-CoV-2/FLU/RSV plus assay is intended as an aid in the diagnosis of influenza from Nasopharyngeal swab specimens and should not be used as a sole basis for treatment. Nasal washings and aspirates are unacceptable for Xpert Xpress SARS-CoV-2/FLU/RSV testing.  Fact Sheet for Patients: BloggerCourse.com  Fact Sheet for Healthcare Providers: SeriousBroker.it  This test is not yet approved or cleared by  the [place]  FDA and has been authorized for detection and/or diagnosis of SARS-CoV-2 by FDA under an Emergency Use Authorization (EUA). This EUA will remain in effect (meaning this test can be used) for the duration of the COVID-19 declaration under Section 564(b)(1) of the Act, 21 U.S.C. section 360bbb-3(b)(1), unless the authorization is terminated or revoked.     Resp Syncytial Virus by PCR NEGATIVE NEGATIVE Final    Comment: (NOTE) Fact Sheet for Patients: BloggerCourse.com  Fact Sheet for Healthcare Providers: SeriousBroker.it  This test is not yet approved or cleared by the [place]  FDA and has been authorized for detection and/or diagnosis of SARS-CoV-2 by FDA under an Emergency Use Authorization (EUA). This EUA will remain in effect (meaning this test can be used) for the duration of the COVID-19 declaration under Section 564(b)(1) of the Act, 21 U.S.C. section  360bbb-3(b)(1), unless the authorization is terminated or revoked.  Performed at [facility], [address], [place], [place] [address]   MRSA Next Gen by PCR, Nasal     Status: Abnormal   Collection Time: 04/12/24 12:33 AM   Specimen: Nasal Mucosa; Nasal Swab  Result Value Ref Range Status   MRSA by PCR Next Gen DETECTED (A) NOT DETECTED Final    Comment: CRITICAL RESULT CALLED TO, READ BACK BY AND VERIFIED WITH: REXIE MANO RN AT 04/12/2024 0609 BY JEREMY C (NOTE) The GeneXpert MRSA Assay (FDA approved for NASAL specimens only), is one component of a comprehensive MRSA colonization surveillance program. It is not intended to diagnose MRSA infection nor to guide or monitor treatment for MRSA infections. Test performance is not FDA approved in patients less than 78 years old. Performed at [facility], [address], [place], [place] [address]       Radiology Studies: CT RENAL STONE STUDY Result Date: 04/17/2024 CLINICAL DATA:  Right flank pain. EXAM: CT ABDOMEN AND PELVIS WITHOUT CONTRAST TECHNIQUE: Multidetector CT imaging of the abdomen and pelvis was performed following the standard protocol without IV contrast. RADIATION DOSE REDUCTION: This exam was performed according to the departmental dose-optimization program which includes automated exposure control, adjustment of the mA and/or kV according to patient size and/or use of iterative reconstruction technique. COMPARISON:  Renal ultrasound 04/15/2024 and CT abdomen pelvis 03/28/2024. FINDINGS: Lower chest: Dependent and scattered subsegmental atelectasis. Small bilateral pleural effusions. Heart is enlarged. No pericardial effusion. Distal esophagus is grossly unremarkable. Hepatobiliary: Liver and gallbladder are unremarkable. No biliary ductal dilatation. Pancreas: Negative. Spleen: Negative. Adrenals/Urinary Tract: Adrenal glands are unremarkable. Bilateral renal stones. Small  low-attenuation lesion in the left kidney. No specific follow-up necessary. Increased bilateral perinephric edema with new stranding along the mid/distal ureters bilaterally. Ureters are decompressed. Bladder is grossly unremarkable. Stomach/Bowel: Stomach and small bowel are unremarkable. Appendix is not readily visualized. Stool throughout the colon. Vascular/Lymphatic: Atherosclerotic calcification of the aorta. No pathologically enlarged lymph nodes. Reproductive: Prostate is visualized. Other: No free fluid. Tiny umbilical hernia contains fat. Mesenteries and peritoneum are otherwise unremarkable. Musculoskeletal: Degenerative changes in the spine. Interbody cages at L3-4 and L4-5. IMPRESSION: 1. Mild perinephric edema with mild stranding along the mid/distal ureters, new from 03/28/2024. No ureteral or bladder stones nor hydronephrosis. Please correlate with laboratory values for the possibility of pyelonephritis. 2. Bilateral renal stones. 3. Small bilateral pleural effusions with dependent and scattered subsegmental atelectasis. 4.  Aortic atherosclerosis (ICD10-I70.0). Electronically Signed   By: Newell Eke M.D.   On: 04/17/2024 09:08      LOS:  7 days    Elgin Lam, MD [facility] 04/18/2024, 11:06 AM   If 7PM-7AM, please contact night-coverage [email or web address]

## 2024-04-18 NOTE — Plan of Care (Signed)
  Problem: Education: Goal: Ability to describe self-care measures that may prevent or decrease complications (Diabetes Survival Skills Education) will improve Outcome: Progressing   Problem: Activity: Goal: Risk for activity intolerance will decrease Outcome: Progressing   Problem: Elimination: Goal: Will not experience complications related to bowel motility Outcome: Progressing Goal: Will not experience complications related to urinary retention Outcome: Progressing   Problem: Pain Managment: Goal: General experience of comfort will improve and/or be controlled Outcome: Progressing

## 2024-04-19 DIAGNOSIS — N182 Chronic kidney disease, stage 2 (mild): Secondary | ICD-10-CM | POA: Diagnosis not present

## 2024-04-19 DIAGNOSIS — J9601 Acute respiratory failure with hypoxia: Secondary | ICD-10-CM | POA: Diagnosis not present

## 2024-04-19 DIAGNOSIS — E1165 Type 2 diabetes mellitus with hyperglycemia: Secondary | ICD-10-CM | POA: Diagnosis not present

## 2024-04-19 DIAGNOSIS — N179 Acute kidney failure, unspecified: Secondary | ICD-10-CM | POA: Diagnosis not present

## 2024-04-19 LAB — MAGNESIUM: Magnesium: 1.9 mg/dL (ref 1.7–2.4)

## 2024-04-19 LAB — GLUCOSE, CAPILLARY
Glucose-Capillary: 243 mg/dL — ABNORMAL HIGH (ref 70–99)
Glucose-Capillary: 368 mg/dL — ABNORMAL HIGH (ref 70–99)

## 2024-04-19 MED ORDER — PREGABALIN 100 MG PO CAPS: 100.0000 mg | ORAL_CAPSULE | Freq: Two times a day (BID) | ORAL | 0 refills | Status: AC

## 2024-04-19 MED ORDER — ENSURE PLUS HIGH PROTEIN PO LIQD: 237.0000 mL | Freq: Two times a day (BID) | ORAL | Status: DC

## 2024-04-19 MED ORDER — HYDROMORPHONE HCL 4 MG PO TABS: 4.0000 mg | ORAL_TABLET | Freq: Four times a day (QID) | ORAL | 0 refills | Status: DC | PRN

## 2024-04-19 NOTE — Discharge Summary (Signed)
 Physician Discharge Summary   Patient: Brett White MRN: [id number] DOB: 17-Apr-1961  Admit date:     04/11/2024  Discharge date: 04/19/24  Discharge Physician: Elgin Lam, MD   PCP: [facility]   Recommendations at discharge: PCP visit for hospital follow-up Establish with cardiology  Discharge Diagnoses: Principal Problem:   Acute renal failure superimposed on stage 2 chronic kidney disease (HCC) Active Problems:   Essential hypertension   Acute respiratory failure with hypoxia (HCC)   Uncontrolled type 2 diabetes mellitus with hyperglycemia, without long-term current use of insulin  (HCC)   SIRS (systemic inflammatory response syndrome) (HCC)  Resolved Problems:   * No resolved hospital problems. *  Hospital Course: Brett White is a 63 y.o. male with a history of hypertension, hyperlipidemia, diabetes mellitus, depression, anxiety, insomnia, IBS, chronic pain syndrome, left BKA, ESBL UTI.  Patient presented secondary to fatigue, malaise, nausea, vomiting and flank pain and was found to have evidence of sepsis secondary to UTI/pyelonephritis, in addition to AKI and acute respiratory failure. Empiric antibiotics started. Respiratory failure likely secondary to acute diastolic heart failure and resolved with Lasix  diuresis. Patient completed antibiotic regimen prior to discharge.  Assessment and Plan:  Acute respiratory failure with hypoxia Unclear etiology. CT chest without clear etiology. V/Q scan obtained and was with no evidence of acute PE. Patient requiring as much as 10 L/min of supplemental oxygen. Repeat chest x-ray )7/15) significant for evidence of pulmonary edema, which could be contributory. Patient given Lasix  80 mg IV x1 with resultant worsening renal function. Transthoracic Echocardiogram obtained on 7/17 suggests right ventricular dysfunction. Weaned down to room air today with continued diuresis. Discussed with cardiology, Dr. Francyne; unlikely RV  dysfunction but with LVH, more likely diastolic heart dysfunction. Recommendation to resume lasix  as an outpatient and follow-up with outpatient cardiology.   Severe sepsis Patient on admission.  Secondary to UTI.  Patient started empirically on vancomycin , ceftriaxone , azithromycin  and transition to meropenem  for ESBL coverage.  Blood cultures with no growth.   ESBL E. coli UTI Pyelonephritis Present on admission.  Urine culture significant for ESBL E. coli. Discussed with ID, who recommends continuing meropenem  until 7/19. Repeat CT suggests evidence of pyelonephritis. ID recommendation to complete meropenem  course with end date of 7/19.  Acute diastolic heart failure Likely diagnosis after discussing with cardiology. Indeterminate read on Transthoracic Echocardiogram. Patient was managed with Lasix  IV for resultant respiratory failure. Recommendation to follow-up with cardiology as an outpatient. Resume Lasix  on discharge.   Right flank pain Continued and not improved. Patient reports it feeling like barbs. States he does not think it is muscle related. Renal ultrasound without abnormality to explain. Repeat CT shows edema and stranding involving the right kidney and ureter and no mention of previously seen stone. Possible patient passed the stone since admission, causing his symptoms   AKI on CKD stage II Baseline creatinine appears to be around 1.24.  Creatinine of 2.70 on admission in the setting of sepsis.  Patient managed with IV fluids initially with worsening respiratory status and worsening renal function. Function improved after starting Lasix  diuresis.   Leukocytosis Secondary to acute infection.  Resolved.   Anemia of chronic disease Baseline hemoglobin appears to be around 12. Acute drop this admission of unknown significant or etiology. No evidence of hemorrhaging. Patient with CT chest abdomen and pelvis without evidence for hematoma. Stable.   Primary hypertension Continue  amlodipine .   Hyperlipidemia Continue Lipitor 40 mg daily.   Hyponatremia Mild.  Stable.  Diabetes mellitus type 2 Uncontrolled based off last hemoglobin A1c of 8.1%.  Patient is on glipizide , Humalog, Farxiga , Trulicity  as an outpatient for management. Continue regimen on discharge.   BPH Continue tamsulosin .   Anxiety Depression Continue Cymbalta  and Zoloft .   Ascending aorta dilation Noted on Transthoracic Echocardiogram measuring 42 mm, in addition to dilation of the aortic root measuring 43 mm.   Chronic pain syndrome Opiate dependence Noted. Continue home Lyrica  and dilaudid .   Obesity, class II Estimated body mass index is 36 kg/m as calculated from the following:   Height as of this encounter: 6' 2 (1.88 m).   Weight as of this encounter: 127.2 kg.  Consultants:  Cardiology Infectious disease   Procedures:  Transthoracic Echocardiogram  Disposition: Long term care facility Diet recommendation: Cardiac and Carb modified diet   DISCHARGE MEDICATION: Allergies as of 04/19/2024   No Known Allergies      Medication List     STOP taking these medications    erythromycin  ophthalmic ointment   methylPREDNISolone  4 MG Tbpk tablet Commonly known as: MEDROL  DOSEPAK   moxifloxacin 0.5 % ophthalmic solution Commonly known as: VIGAMOX       TAKE these medications    acetaminophen  325 MG tablet Commonly known as: TYLENOL  Take 2 tablets (650 mg total) by mouth every 6 (six) hours as needed for mild pain (or Fever >/= 101).   amLODipine  10 MG tablet Commonly known as: NORVASC  Take 10 mg by mouth every morning.   atorvastatin  40 MG tablet Commonly known as: LIPITOR Take 40 mg by mouth at bedtime.   bacitracin -polymyxin b  ophthalmic ointment Commonly known as: POLYSPORIN  Place into the right eye 4 (four) times daily. Place a 1/2 inch ribbon of ointment into the lower eyelid 4 a day and as needed for discomfort   bisacodyl  5 MG EC  tablet Commonly known as: DULCOLAX Take 10 mg by mouth daily as needed (CONSTIPATION).   brimonidine  0.2 % ophthalmic solution Commonly known as: ALPHAGAN  Place 1 drop into the left eye in the morning and at bedtime.   carboxymethylcellulose 1 % ophthalmic solution Place 4 drops into the left eye in the morning, at noon, in the evening, and at bedtime.   cyclobenzaprine  5 MG tablet Commonly known as: FLEXERIL  Take 1 tablet (5 mg total) by mouth 3 (three) times daily as needed for muscle spasms.   diclofenac  Sodium 1 % Gel Commonly known as: VOLTAREN  Apply 4 g topically 4 (four) times daily.   DULoxetine  60 MG capsule Commonly known as: CYMBALTA  Take 60 mg by mouth in the morning.   ergocalciferol  1.25 MG (50000 UT) capsule Commonly known as: VITAMIN D2 Take 50,000 Units by mouth once a week. Fridays   famotidine  20 MG tablet Commonly known as: PEPCID  Take 20 mg by mouth in the morning.   Farxiga  10 MG Tabs tablet Generic drug: dapagliflozin  propanediol Take 10 mg by mouth every morning. Dapaglifozin   feeding supplement Liqd Take 237 mLs by mouth 2 (two) times daily between meals.   furosemide  20 MG tablet Commonly known as: LASIX  Take 20 mg by mouth every morning.   glipiZIDE  10 MG tablet Commonly known as: GLUCOTROL  Take 10 mg by mouth every morning.   guaiFENesin  100 MG/5ML liquid Commonly known as: ROBITUSSIN Take 10 mLs by mouth every 4 (four) hours as needed for cough or to loosen phlegm.   HYDROmorphone  4 MG tablet Commonly known as: DILAUDID  Take 1 tablet (4 mg total) by mouth every 6 (  six) hours as needed for moderate pain (pain score 4-6) or severe pain (pain score 7-10).   insulin  lispro 100 UNIT/ML KwikPen Commonly known as: HUMALOG Inject 2-8 Units into the skin 3 (three) times daily with meals. Sliding scale 201-250=2 units 251-300=4 units 301-350=6 units 351-400= 8 units 401 or greater than=call [facility]   Linzess  145 MCG Caps  capsule Generic drug: linaclotide  Take 145 mcg by mouth in the morning.   losartan  100 MG tablet Commonly known as: COZAAR  Take 0.5 tablets (50 mg total) by mouth in the morning.   Melatonin 5 MG Caps Take 10 mg by mouth at bedtime.   Milk of Magnesia 1200 MG/15ML suspension Generic drug: magnesium  hydroxide Take 30 mLs by mouth daily as needed for mild constipation.   naloxone  4 MG/0.1ML Liqd nasal spray kit Commonly known as: NARCAN  Place 1 spray into the nose See admin instructions. Every 2-3 minutesas needed until patient response/ emba arrived.   pantoprazole  40 MG tablet Commonly known as: PROTONIX  Take 1 tablet (40 mg total) by mouth daily. What changed: when to take this   polyethylene glycol 17 g packet Commonly known as: MIRALAX  / GLYCOLAX  Take 17 g by mouth daily. What changed:  when to take this additional instructions   pregabalin  100 MG capsule Commonly known as: LYRICA  Take 1 capsule (100 mg total) by mouth 2 (two) times daily.   sertraline  50 MG tablet Commonly known as: ZOLOFT  Take 50 mg by mouth in the morning.   tamsulosin  0.4 MG Caps capsule Commonly known as: FLOMAX  Take 1 capsule (0.4 mg total) by mouth daily after supper.   traZODone  150 MG tablet Commonly known as: DESYREL  Take 75 mg by mouth at bedtime.   Trulicity  1.5 MG/0.5ML Soaj Generic drug: Dulaglutide  Inject 0.5 mg into the skin once a week. On Thursdays        Follow-up Information     [facility] Follow up.   Specialty: Skilled Nursing Facility Why: For hospital follow-up Contact information: [address] [place] [address] [phone]         Croitoru, Jerel, MD. Schedule an appointment as soon as possible for a visit in 1 week(s).   Specialty: Cardiology Why: For hospital follow-up Contact information: [address] [place] [address] [phone]                Discharge Exam: BP (!) 156/90 (BP Location: Left Arm)    Pulse 85   Temp 98.6 F (37 C)   Resp 17   Ht 6' 2 (1.88 m)   Wt 127.2 kg   SpO2 92%   BMI 36.00 kg/m   General exam: Appears calm and comfortable Respiratory system: Respiratory effort normal. Central nervous system: Alert and oriented. Psychiatry: Judgement and insight appear normal. Mood & affect appropriate.   Condition at discharge: stable  The results of significant diagnostics from this hospitalization (including imaging, microbiology, ancillary and laboratory) are listed below for reference.   Imaging Studies: CT RENAL STONE STUDY Result Date: 04/17/2024 CLINICAL DATA:  Right flank pain. EXAM: CT ABDOMEN AND PELVIS WITHOUT CONTRAST TECHNIQUE: Multidetector CT imaging of the abdomen and pelvis was performed following the standard protocol without IV contrast. RADIATION DOSE REDUCTION: This exam was performed according to the departmental dose-optimization program which includes automated exposure control, adjustment of the mA and/or kV according to patient size and/or use of iterative reconstruction technique. COMPARISON:  Renal ultrasound 04/15/2024 and CT abdomen pelvis 03/28/2024. FINDINGS: Lower chest: Dependent  and scattered subsegmental atelectasis. Small bilateral pleural effusions. Heart is enlarged. No pericardial effusion. Distal esophagus is grossly unremarkable. Hepatobiliary: Liver and gallbladder are unremarkable. No biliary ductal dilatation. Pancreas: Negative. Spleen: Negative. Adrenals/Urinary Tract: Adrenal glands are unremarkable. Bilateral renal stones. Small low-attenuation lesion in the left kidney. No specific follow-up necessary. Increased bilateral perinephric edema with new stranding along the mid/distal ureters bilaterally. Ureters are decompressed. Bladder is grossly unremarkable. Stomach/Bowel: Stomach and small bowel are unremarkable. Appendix is not readily visualized. Stool throughout the colon. Vascular/Lymphatic: Atherosclerotic calcification of the  aorta. No pathologically enlarged lymph nodes. Reproductive: Prostate is visualized. Other: No free fluid. Tiny umbilical hernia contains fat. Mesenteries and peritoneum are otherwise unremarkable. Musculoskeletal: Degenerative changes in the spine. Interbody cages at L3-4 and L4-5. IMPRESSION: 1. Mild perinephric edema with mild stranding along the mid/distal ureters, new from 03/28/2024. No ureteral or bladder stones nor hydronephrosis. Please correlate with laboratory values for the possibility of pyelonephritis. 2. Bilateral renal stones. 3. Small bilateral pleural effusions with dependent and scattered subsegmental atelectasis. 4.  Aortic atherosclerosis (ICD10-I70.0). Electronically Signed   By: Newell Eke M.D.   On: 04/17/2024 09:08   ECHOCARDIOGRAM COMPLETE Result Date: 04/16/2024    ECHOCARDIOGRAM REPORT   Patient Name:   Brett White Date of Exam: 04/16/2024 Medical Rec #:  [id number]      Height:       74.0 in Accession #:    [id number]     Weight:       280.6 lb Date of Birth:  03-Mar-1961      BSA:          2.510 m Patient Age:    62 years       BP:           86/59 mmHg Patient Gender: M              HR:           88 bpm. Exam Location:  Inpatient Procedure: 2D Echo, Cardiac Doppler and Color Doppler (Both Spectral and Color            Flow Doppler were utilized during procedure). Indications:    I50.40* Unspecified combined systolic (congestive) and diastolic                 (congestive) heart failure  History:        Patient has no prior history of Echocardiogram examinations.                 Abnormal ECG, Arrythmias:Tachycardia, Signs/Symptoms:Dyspnea and                 Shortness of Breath; Risk Factors:Hypertension, Dyslipidemia and                 Diabetes.  Sonographer:    Ellouise Mose RDCS Referring Phys: [id number] Tyquez Hollibaugh A [place] Study  Sonographer Comments: Technically difficult study due to poor echo windows and patient is obese. Image acquisition challenging due to patient body habitus. IMPRESSIONS   1. Left ventricular ejection fraction, by estimation, is 55 to 60%. The left ventricle has normal function. The left ventricle has no regional wall motion abnormalities. Left ventricular diastolic parameters are indeterminate.  2. Right ventricular systolic function is low normal. The right ventricular size is moderately enlarged. Tricuspid regurgitation signal is inadequate for assessing PA pressure.  3. The mitral valve is normal in structure. Trivial mitral valve regurgitation. No evidence of mitral stenosis.  4. The aortic valve is tricuspid. Aortic  valve regurgitation is not visualized. No aortic stenosis is present.  5. Aortic dilatation noted. There is dilatation of the ascending aorta, measuring 42 mm. There is dilatation of the aortic root, measuring 43 mm.  6. The inferior vena cava is dilated in size with <50% respiratory variability, suggesting right atrial pressure of 15 mmHg. Comparison(s): No prior Echocardiogram. FINDINGS  Left Ventricle: Left ventricular ejection fraction, by estimation, is 55 to 60%. The left ventricle has normal function. The left ventricle has no regional wall motion abnormalities. The left ventricular internal cavity size was normal in size. There is  no left ventricular hypertrophy. Left ventricular diastolic parameters are indeterminate. Right Ventricle: The right ventricular size is moderately enlarged. No increase in right ventricular wall thickness. Right ventricular systolic function is low normal. Tricuspid regurgitation signal is inadequate for assessing PA pressure. Left Atrium: Left atrial size was normal in size. Right Atrium: Right atrial size was normal in size. Pericardium: There is no evidence of pericardial effusion. Mitral Valve: The mitral valve is normal in structure. Trivial mitral valve regurgitation. No evidence of mitral valve stenosis. Tricuspid Valve: The tricuspid valve is normal in structure. Tricuspid valve regurgitation is trivial. No evidence of  tricuspid stenosis. Aortic Valve: The aortic valve is tricuspid. Aortic valve regurgitation is not visualized. No aortic stenosis is present. Pulmonic Valve: The pulmonic valve was normal in structure. Pulmonic valve regurgitation is not visualized. No evidence of pulmonic stenosis. Aorta: Aortic dilatation noted. There is dilatation of the ascending aorta, measuring 42 mm. There is dilatation of the aortic root, measuring 43 mm. Venous: The inferior vena cava is dilated in size with less than 50% respiratory variability, suggesting right atrial pressure of 15 mmHg. IAS/Shunts: There is right bowing of the interatrial septum, suggestive of elevated left atrial pressure. No atrial level shunt detected by color flow Doppler.  LEFT VENTRICLE PLAX 2D LVIDd:         5.00 cm      Diastology LVIDs:         3.70 cm      LV e' medial:    6.85 cm/s LV PW:         1.40 cm      LV E/e' medial:  13.6 LV IVS:        1.30 cm      LV e' lateral:   7.94 cm/s LVOT diam:     2.40 cm      LV E/e' lateral: 11.8 LV SV:         81 LV SV Index:   32 LVOT Area:     4.52 cm  LV Volumes (MOD) LV vol d, MOD A2C: 81.7 ml LV vol d, MOD A4C: 100.0 ml LV vol s, MOD A2C: 37.1 ml LV vol s, MOD A4C: 46.5 ml LV SV MOD A2C:     44.6 ml LV SV MOD A4C:     100.0 ml LV SV MOD BP:      48.5 ml RIGHT VENTRICLE             IVC RV S prime:     12.24 cm/s  IVC diam: 2.40 cm TAPSE (M-mode): 1.7 cm LEFT ATRIUM             Index       RIGHT ATRIUM           Index LA diam:        2.80 cm 1.12 cm/m  RA Area:  13.70 cm LA Vol (A2C):   22.0 ml 8.76 ml/m  RA Volume:   29.00 ml  11.55 ml/m LA Vol (A4C):   23.0 ml 9.16 ml/m LA Biplane Vol: 23.8 ml 9.48 ml/m  AORTIC VALVE LVOT Vmax:   99.20 cm/s LVOT Vmean:  62.400 cm/s LVOT VTI:    0.179 m  AORTA Ao Root diam: 4.30 cm Ao Asc diam:  4.10 cm MITRAL VALVE MV Area (PHT): 4.30 cm    SHUNTS MV Decel Time: 177 msec    Systemic VTI:  0.18 m MV E velocity: 93.45 cm/s  Systemic Diam: 2.40 cm MV A velocity: 97.40 cm/s  MV E/A ratio:  0.96 Morene Brownie Electronically signed by Morene Brownie Signature Date/Time: 04/16/2024/3:35:42 PM    Final    US  RENAL Result Date: 04/15/2024 CLINICAL DATA:  Acute kidney insufficiency EXAM: RENAL / URINARY TRACT ULTRASOUND COMPLETE COMPARISON:  CT scan 04/11/2024 FINDINGS: Right Kidney: Renal measurements: 13.7 x 6.6 x 6.3 cm = volume: 298.1 mL. Mild parenchymal atrophy. No collecting system dilatation or perinephric fluid. Shadowing lower pole right-sided renal stone measuring 19 mm. Left Kidney: Renal measurements: 13.8 x 7.2 x 6.4 cm = volume: 330.3 mL. Mild parenchymal atrophy. No collecting system dilatation or perinephric fluid. There is a central cystic area measuring 3 cm which was seen on the prior CT scan as a parapelvic cyst. Bladder: Appears normal for degree of bladder distention. Other: Limited by overlapping bowel gas and soft tissue. IMPRESSION: Bilateral renal atrophy without collecting system dilatation. Left-sided parapelvic renal cyst as seen on prior CT. Right-sided renal stone. Electronically Signed   By: Ranell Bring M.D.   On: 04/15/2024 17:10   DG Chest 2 View Result Date: 04/15/2024 CLINICAL DATA:  [id number] Acute respiratory failure with hypoxia (HCC) [id number] EXAM: CHEST - 2 VIEW COMPARISON:  None available. FINDINGS: Diffuse interstitial opacities throughout both lungs. No focal airspace consolidation or pneumothorax. Small bilateral pleural effusions. Moderate cardiomegaly. Tortuous aorta with aortic atherosclerosis. No acute fracture or destructive lesions. Multilevel thoracic osteophytosis. Right PICC terminates at the cavoatrial junction. IMPRESSION: Moderate cardiomegaly with similar findings of interstitial edema. Small bilateral pleural effusions. Electronically Signed   By: Rogelia Myers M.D.   On: 04/15/2024 11:42   DG CHEST PORT 1 VIEW Result Date: 04/14/2024 CLINICAL DATA:  Dyspnea EXAM: PORTABLE CHEST 1 VIEW COMPARISON:  Chest radiograph dated  04/11/2024 FINDINGS: Right upper extremity PICC tip projects over the SVC. Mildly low lung volumes. Increased diffuse interstitial opacities. No pleural effusion or pneumothorax. Similar mildly enlarged cardiomediastinal silhouette. No acute osseous abnormality. IMPRESSION: Increased pulmonary edema. Electronically Signed   By: Limin  Xu M.D.   On: 04/14/2024 12:07   NM Pulmonary Perfusion Result Date: 04/13/2024 CLINICAL DATA:  Pulmonary embolism (PE) suspected, high prob EXAM: NUCLEAR MEDICINE PERFUSION LUNG SCAN TECHNIQUE: Perfusion images were obtained in multiple projections after intravenous injection of radiopharmaceutical. No ventilation imaging performed. RADIOPHARMACEUTICALS:  4.38 mCi Tc-18m MAA IV COMPARISON:  Noncontrast chest CT 04/11/2024. Chest radiographs 04/11/2024. FINDINGS: There are no wedge-shaped perfusion defects to suggest acute pulmonary embolism. Pulmonary perfusion within normal limits. IMPRESSION: No evidence of acute pulmonary embolism on perfusion scintigraphy by PISAPED criteria. Electronically Signed   By: Elsie Perone M.D.   On: 04/13/2024 14:36   US  EKG SITE RITE Result Date: 04/12/2024 If Site Rite image not attached, placement could not be confirmed due to current cardiac rhythm.  CT CHEST ABDOMEN PELVIS WO CONTRAST Result Date: 04/11/2024 CLINICAL DATA:  Sepsis EXAM: CT  CHEST, ABDOMEN AND PELVIS WITHOUT CONTRAST TECHNIQUE: Multidetector CT imaging of the chest, abdomen and pelvis was performed following the standard protocol without IV contrast. RADIATION DOSE REDUCTION: This exam was performed according to the departmental dose-optimization program which includes automated exposure control, adjustment of the mA and/or kV according to patient size and/or use of iterative reconstruction technique. COMPARISON:  Chest x-ray from earlier in the same day, CT from 03/28/2024. FINDINGS: CT CHEST FINDINGS Cardiovascular: Somewhat limited due to lack of IV contrast. Thoracic  aorta shows no aneurysmal dilatation. Mild atherosclerotic calcifications are seen. Pulmonary artery is not significantly enlarged. The heart is within normal limits. No significant coronary calcifications are noted. Mediastinum/Nodes: Thoracic inlet is within normal limits. No hilar or mediastinal adenopathy is noted. The esophagus as visualized is within normal limits. Lungs/Pleura: Mild bibasilar atelectatic changes are seen. No sizable effusion is noted. No parenchymal nodules are seen. Musculoskeletal: No chest wall mass or suspicious bone lesions identified. CT ABDOMEN PELVIS FINDINGS Hepatobiliary: Liver and gallbladder are within normal limits. Pancreas: Unremarkable. No pancreatic ductal dilatation or surrounding inflammatory changes. Spleen: Normal in size without focal abnormality. Adrenals/Urinary Tract: Adrenal glands are within normal limits. Kidneys demonstrate bilateral perinephric stranding similar to that seen on the prior exam. Simple renal cyst is noted centrally in the left kidney. No follow-up is recommended. Scattered nonobstructing stones are noted on the right the largest of which measures 10 mm. The ureters are within normal limits. No obstructive changes are seen. Hyperdense cyst is again noted in the right kidney inferiorly. No follow-up is recommended. The bladder is partially distended. Stomach/Bowel: No obstructive or inflammatory changes of the colon are noted. The appendix is not visualized consistent with a prior surgical history. Stomach and small bowel are within normal limits. Vascular/Lymphatic: Aortic atherosclerosis. No enlarged abdominal or pelvic lymph nodes. Reproductive: Prostate is unremarkable. Other: Small fat containing umbilical hernia is noted. No abdominopelvic ascites. Musculoskeletal: Postsurgical changes are noted in the lower lumbar spine. IMPRESSION: CT of the chest: Bibasilar atelectasis without sizable effusion. CT of the abdomen and pelvis: Nonobstructing  right renal calculi similar to that seen on the prior exam. No obstructive changes are seen. Electronically Signed   By: Oneil Devonshire M.D.   On: 04/11/2024 20:04   DG Chest Port 1 View Result Date: 04/11/2024 CLINICAL DATA:  Nausea, vomiting and diarrhea 3 days. Fell today. Questionable sepsis. EXAM: PORTABLE CHEST 1 VIEW COMPARISON:  Chest x-ray 03/24/2024 FINDINGS: The cardiac silhouette, mediastinal and hilar contours are within normal limits and stable given the AP projection, portable technique and low lung volumes. Low lung volumes with vascular crowding and bibasilar atelectasis. No definite infiltrates, effusions or pneumothorax. IMPRESSION: Low lung volumes with vascular crowding and bibasilar atelectasis. Electronically Signed   By: MYRTIS Stammer M.D.   On: 04/11/2024 18:26   CT Renal Stone Study Result Date: 03/28/2024 CLINICAL DATA:  Abdominal/flank pain, stone suspected. EXAM: CT ABDOMEN AND PELVIS WITHOUT CONTRAST TECHNIQUE: Multidetector CT imaging of the abdomen and pelvis was performed following the standard protocol without IV contrast. RADIATION DOSE REDUCTION: This exam was performed according to the departmental dose-optimization program which includes automated exposure control, adjustment of the mA and/or kV according to patient size and/or use of iterative reconstruction technique. COMPARISON:  03/20/2024 and 03/15/2024 FINDINGS: Lower chest: Again noted are streaky densities at the lung bases suggestive for scarring and/or atelectasis. No pleural effusions. Hepatobiliary: Normal appearance of the liver and gallbladder. Pancreas: Unremarkable. No pancreatic ductal dilatation or surrounding inflammatory changes.  Spleen: Normal in size without focal abnormality. Adrenals/Urinary Tract: Normal adrenal glands. Chronic perinephric stranding or edema. Negative for hydronephrosis. Normal urinary bladder. Multiple right kidney stones, largest measures 1.1 cm in lower pole. Again noted is a left  renal sinus cyst. Small hyperdense exophytic structure in the right kidney lower pole measures 1.1 cm and minimally changed since 2024. This is likely a hyperdense cyst. These renal cysts do not require dedicated follow-up. Tiny stone in left kidney lower pole. Stomach/Bowel: Moderate stool burden. No bowel dilatation or obstruction. No focal bowel inflammation. Normal appearance of the stomach. Vascular/Lymphatic: Atherosclerotic calcifications in the abdominal aorta without aneurysm. No significant lymph node enlargement in the abdomen or pelvis. Reproductive: Prostate is unremarkable. Other: Negative for free fluid. Negative for free air. Small umbilical hernia containing fat. Musculoskeletal: Interbody fusion at L3-L4 and L4-L5. Disc space narrowing at L5-S1. No acute bone abnormality. IMPRESSION: 1. No acute abnormality in the abdomen or pelvis. 2. Nonobstructive bilateral renal calculi. Largest stone measures 1.1 cm in the right kidney lower pole. 3. Moderate stool burden. 4. Umbilical hernia. 5. Aortic Atherosclerosis (ICD10-I70.0). Electronically Signed   By: Juliene Balder M.D.   On: 03/28/2024 09:45   DG CHEST PORT 1 VIEW Result Date: 03/24/2024 CLINICAL DATA:  Cough. EXAM: PORTABLE CHEST 1 VIEW COMPARISON:  Chest radiograph dated 03/02/2024. FINDINGS: No focal consolidation, pleural effusion or pneumothorax. The cardiac silhouette is within normal limits. No acute osseous pathology. IMPRESSION: No active disease. Electronically Signed   By: Vanetta Chou M.D.   On: 03/24/2024 14:03   CT Angio Chest/Abd/Pel for Dissection W and/or Wo Contrast Result Date: 03/20/2024 CLINICAL DATA:  Acute aortic syndrome suspected 63 year old male. Chest pain EXAM: CT ANGIOGRAPHY CHEST, ABDOMEN AND PELVIS TECHNIQUE: Non-contrast CT of the chest was initially obtained. Multidetector CT imaging through the chest, abdomen and pelvis was performed using the standard protocol during bolus administration of intravenous  contrast. Multiplanar reconstructed images and MIPs were obtained and reviewed to evaluate the vascular anatomy. RADIATION DOSE REDUCTION: This exam was performed according to the departmental dose-optimization program which includes automated exposure control, adjustment of the mA and/or kV according to patient size and/or use of iterative reconstruction technique. CONTRAST:  OMNIPAQUE  IOHEXOL  350 MG/ML SOLN COMPARISON:  None Available. FINDINGS: CTA CHEST FINDINGS Cardiovascular: Non IV contrast images demonstrate no intramural hematoma within the thoracic aorta. Contrast series demonstrates no aortic dissection or aneurysm. Great vessels are normal. No pericardial fluid. Mediastinum/Nodes: No axillary or supraclavicular adenopathy. No mediastinal or hilar adenopathy. No pericardial fluid. Esophagus normal. Lungs/Pleura: No pulmonary infarction. No pneumonia. No pleural fluid. No pneumothorax Musculoskeletal: No acute findings Review of the MIP images confirms the above findings. CTA ABDOMEN AND PELVIS FINDINGS VASCULAR Aorta: Normal caliber aorta without aneurysm, dissection, vasculitis or significant stenosis. Celiac: Patent without evidence of aneurysm, dissection, vasculitis or significant stenosis. SMA: Patent without evidence of aneurysm, dissection, vasculitis or significant stenosis. Renals: Both renal arteries are patent without evidence of aneurysm, dissection, vasculitis, fibromuscular dysplasia or significant stenosis. IMA: Patent without evidence of aneurysm, dissection, vasculitis or significant stenosis. Inflow: Patent without evidence of aneurysm, dissection, vasculitis or significant stenosis. Veins: No obvious venous abnormality within the limitations of this arterial phase study. Review of the MIP images confirms the above findings. NON-VASCULAR Hepatobiliary: No focal hepatic lesion. Normal gallbladder. No biliary duct dilatation. Common bile duct is normal. Pancreas: Pancreas is normal.  No ductal dilatation. No pancreatic inflammation. Spleen: Normal spleen Adrenals/urinary tract: Adrenal glands and kidneys are normal.  Small exophytic cystic lesion extending from the lower pole of the RIGHT kidney measures 11 mm. On comparison CT 03/15/2024 this cyst was high-density on noncontrast imaging. No apparent enhancement on the postcontrast study. 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 Nonobstructing calculus in the RIGHT kidney. Ureters and bladder normal. Stomach/Bowel: The stomach, duodenum, and small bowel normal. The colon and rectosigmoid colon are normal. Vascular/Lymphatic: Abdominal aorta is normal caliber. No periportal or retroperitoneal adenopathy. No pelvic adenopathy. Reproductive: Unremarkable Other: No free fluid. Musculoskeletal: No aggressive osseous lesion. Review of the MIP images confirms the above findings. IMPRESSION: CHEST: 1. No evidence of aortic dissection or aneurysm. 2. No acute pulmonary findings. PELVIS: 1. No evidence of aortic dissection or aneurysm. 2. No acute findings in the abdomen pelvis. 3. Nonobstructing calculus in the RIGHT kidney. 4. 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 JACR 2018 Feb; 264-273, Management of the Incidental Renal Mass on CT, [employer] 2021; [id number], Bosniak Classification of Cystic Renal Masses, Version 2019. Electronically Signed   By: Jackquline Boxer M.D.   On: 03/20/2024 19:59   DG Chest 2 View Result Date: 03/20/2024 CLINICAL DATA:  Right flank pain and chest pain. EXAM: CHEST - 2 VIEW COMPARISON:  03/15/2024 FINDINGS: Lateral view degraded by patient arm position. Midline trachea. Mild cardiomegaly. Ascending aortic prominence is similar. No pleural effusion or pneumothorax. No congestive failure. Minimal subsegmental atelectasis or scarring at both lung bases. IMPRESSION: 1. No acute cardiopulmonary disease. 2. Similar ascending aortic prominence.  CTA  is pending. Electronically Signed   By: Rockey Kilts M.D.   On: 03/20/2024 17:41    Microbiology: Results for orders placed or performed during the hospital encounter of 04/11/24  Urine Culture     Status: Abnormal   Collection Time: 04/11/24  3:31 PM   Specimen: Urine, Random  Result Value Ref Range Status   Specimen Description   Final    URINE, RANDOM Performed at [facility], [address], [place], [place] [address]    Special Requests   Final    NONE Reflexed from [id number] Performed at [facility], [address], [place], [place] [address]    Culture (A)  Final    80,000 COLONIES/mL ESCHERICHIA COLI Confirmed Extended Spectrum Beta-Lactamase Producer (ESBL).  In bloodstream infections from ESBL organisms, carbapenems are preferred over piperacillin /tazobactam. They are shown to have a lower risk of mortality.    Report Status 04/14/2024 FINAL  Final   Organism ID, Bacteria ESCHERICHIA COLI (A)  Final      Susceptibility   Escherichia coli - MIC*    AMPICILLIN  >=32 RESISTANT Resistant     CEFAZOLIN  >=64 RESISTANT Resistant     CEFEPIME  >=32 RESISTANT Resistant     CEFTRIAXONE  >=64 RESISTANT Resistant     CIPROFLOXACIN >=4 RESISTANT Resistant     GENTAMICIN  <=1 SENSITIVE Sensitive     IMIPENEM <=0.25 SENSITIVE Sensitive     NITROFURANTOIN <=16 SENSITIVE Sensitive     TRIMETH /SULFA  >=320 RESISTANT Resistant     AMPICILLIN /SULBACTAM >=32 RESISTANT Resistant     PIP/TAZO 8 SENSITIVE Sensitive ug/mL    * 80,000 COLONIES/mL ESCHERICHIA COLI  Blood Culture (routine x 2)     Status: None   Collection Time: 04/11/24  5:40 PM   Specimen: BLOOD LEFT FOREARM  Result Value Ref Range Status   Specimen Description   Final    BLOOD LEFT FOREARM Performed at [facility], [address], [place], [place] [address]    Special Requests   Final    BOTTLES DRAWN AEROBIC AND ANAEROBIC Blood Culture results may not be optimal due to an  inadequate volume of blood received in culture bottles Performed at [facility], [address] [place], [place] [address]    Culture   Final    NO GROWTH 5 DAYS Performed at [facility], [address] [place] [address], [place], [place] [address]    Report Status 04/16/2024 FINAL  Final  Blood Culture (routine x 2)     Status: None   Collection Time: 04/11/24  5:46 PM   Specimen: BLOOD LEFT HAND  Result Value Ref Range Status   Specimen Description BLOOD LEFT HAND  Final   Special Requests   Final    BOTTLES DRAWN AEROBIC AND ANAEROBIC Blood Culture adequate volume   Culture   Final    NO GROWTH 5 DAYS Performed at [facility], [address] [place], [place] [address]    Report Status 04/16/2024 FINAL  Final  Resp panel by RT-PCR (RSV, Flu A&B, Covid) Anterior Nasal Swab     Status: None   Collection Time: 04/11/24  6:50 PM   Specimen: Anterior Nasal Swab  Result Value Ref Range Status   SARS Coronavirus 2 by RT PCR NEGATIVE NEGATIVE Final    Comment: (NOTE) SARS-CoV-2 target nucleic acids are NOT DETECTED.  The SARS-CoV-2 RNA is generally detectable in upper respiratory specimens during the acute phase of infection. The lowest concentration of SARS-CoV-2 viral copies this assay can detect is 138 copies/mL. A negative result does not preclude SARS-Cov-2 infection and should not be used as the sole basis for treatment or other patient management decisions. A negative result may occur with  improper specimen collection/handling, submission of specimen other than nasopharyngeal swab, presence of viral mutation(s) within the areas targeted by this assay, and inadequate number of viral copies(<138 copies/mL). A negative result must be combined with clinical observations, patient history, and epidemiological information. The expected result is Negative.  Fact Sheet for Patients:  BloggerCourse.com  Fact Sheet for Healthcare Providers:   SeriousBroker.it  This test is no t yet approved or cleared by the [place]  FDA and  has been authorized for detection and/or diagnosis of SARS-CoV-2 by FDA under an Emergency Use Authorization (EUA). This EUA will remain  in effect (meaning this test can be used) for the duration of the COVID-19 declaration under Section 564(b)(1) of the Act, 21 U.S.C.section 360bbb-3(b)(1), unless the authorization is terminated  or revoked sooner.       Influenza A by PCR NEGATIVE NEGATIVE Final   Influenza B by PCR NEGATIVE NEGATIVE Final    Comment: (NOTE) The Xpert Xpress SARS-CoV-2/FLU/RSV plus assay is intended as an aid in the diagnosis of influenza from Nasopharyngeal swab specimens and should not be used as a sole basis for treatment. Nasal washings and aspirates are unacceptable for Xpert Xpress SARS-CoV-2/FLU/RSV testing.  Fact Sheet for Patients: BloggerCourse.com  Fact Sheet for Healthcare Providers: SeriousBroker.it  This test is not yet approved or cleared by the [place]  FDA and has been authorized for detection and/or diagnosis of SARS-CoV-2 by FDA under an Emergency Use Authorization (EUA). This EUA will remain in effect (meaning this test can be used) for the duration of the COVID-19 declaration under Section 564(b)(1) of the Act, 21 U.S.C. section 360bbb-3(b)(1), unless the authorization is terminated or revoked.     Resp Syncytial Virus by  PCR NEGATIVE NEGATIVE Final    Comment: (NOTE) Fact Sheet for Patients: BloggerCourse.com  Fact Sheet for Healthcare Providers: SeriousBroker.it  This test is not yet approved or cleared by the [place]  FDA and has been authorized for detection and/or diagnosis of SARS-CoV-2 by FDA under an Emergency Use Authorization (EUA). This EUA will remain in effect (meaning this test can be used) for  the duration of the COVID-19 declaration under Section 564(b)(1) of the Act, 21 U.S.C. section 360bbb-3(b)(1), unless the authorization is terminated or revoked.  Performed at [facility], [address] [place], [place] [address]   MRSA Next Gen by PCR, Nasal     Status: Abnormal   Collection Time: 04/12/24 12:33 AM   Specimen: Nasal Mucosa; Nasal Swab  Result Value Ref Range Status   MRSA by PCR Next Gen DETECTED (A) NOT DETECTED Final    Comment: CRITICAL RESULT CALLED TO, READ BACK BY AND VERIFIED WITH: REXIE MANO RN AT 04/12/2024 0609 BY JEREMY C (NOTE) The GeneXpert MRSA Assay (FDA approved for NASAL specimens only), is one component of a comprehensive MRSA colonization surveillance program. It is not intended to diagnose MRSA infection nor to guide or monitor treatment for MRSA infections. Test performance is not FDA approved in patients less than 11 years old. Performed at [facility], [address], [place], [place] [address]     Labs: CBC: Recent Labs  Lab 04/13/24 0548 04/14/24 0719 04/15/24 0447 04/17/24 0352  WBC 11.1* 6.8 7.4 5.9  NEUTROABS  --   --  5.7  --   HGB 9.5* 8.9* 8.2* 8.8*  HCT 31.7* 29.7* 27.2* 29.5*  MCV 80.7 80.7 80.7 81.7  PLT 192 179 211 339   Basic Metabolic Panel: Recent Labs  Lab 04/14/24 0719 04/15/24 0447 04/16/24 0500 04/17/24 0352 04/18/24 0324 04/19/24 0326  NA 133* 132* 137 137 138  --   K 4.6 4.2 4.7 5.0 4.8  --   CL 101 98 98 97* 98  --   CO2 28 27 30 31 31   --   GLUCOSE 223* 213* 276* 213* 196*  --   BUN 20 23* 30* 33* 27*  --   CREATININE 1.28* 1.42* 1.48* 1.20 0.96  --   CALCIUM  7.8* 8.0* 8.5* 8.6* 8.6*  --   MG 1.9 2.0 2.1 1.9 1.7 1.9   Liver Function Tests: Recent Labs  Lab 04/15/24 0447  AST 27  ALT 25  ALKPHOS 96  BILITOT 0.5  PROT 5.9*  ALBUMIN  2.3*   CBG: Recent Labs  Lab 04/18/24 0736 04/18/24 1113 04/18/24 1646 04/18/24 2059 04/19/24 0750  GLUCAP  216* 274* 294* 276* 243*    Discharge time spent: 35 minutes.  Signed: Elgin Lam, MD [facility] 04/19/2024

## 2024-04-19 NOTE — Progress Notes (Signed)
 Called Into skilled facility at around 12:30pm 04/19/2024 at [facility] living to give report to the nursing receiving this patient.  The operator took my name and number and said that the nurse would call me back for report.  Tillman Evert RN

## 2024-04-19 NOTE — Plan of Care (Signed)
  Problem: Coping: Goal: Ability to adjust to condition or change in health will improve Outcome: Progressing   Problem: Health Behavior/Discharge Planning: Goal: Ability to manage health-related needs will improve Outcome: Progressing   Problem: Nutritional: Goal: Maintenance of adequate nutrition will improve Outcome: Progressing   Problem: Clinical Measurements: Goal: Diagnostic test results will improve Outcome: Progressing   Problem: Education: Goal: Knowledge of General Education information will improve Description: Including pain rating scale, medication(s)/side effects and non-pharmacologic comfort measures Outcome: Progressing

## 2024-04-19 NOTE — [place] FL2 (Signed)
 [place]  MEDICAID FL2 LEVEL OF CARE FORM     IDENTIFICATION  Patient Name: Brett White Birthdate: [birth year] Sex: male Admission Date (Current Location): 04/11/2024  [facility] and [place] Number:  Producer, television/film/video and Address:  [facility],  [address] [place], [place] [address]      Provider Number: [id number]  Attending Physician Name and Address:  Briana Elgin LABOR, MD  Relative Name and Phone Number:  Lonne Browning (Relative)  [phone]    Current Level of Care: Hospital Recommended Level of Care: Assisted Living Facility Prior Approval Number:    Date Approved/Denied:   PASRR Number: [id number]  Discharge Plan: Other (Comment) (LTC)    Current Diagnoses: Patient Active Problem List   Diagnosis Date Noted   Acute renal failure superimposed on stage 2 chronic kidney disease (HCC) 04/11/2024   SIRS (systemic inflammatory response syndrome) (HCC) 04/11/2024   Acute cystitis 03/20/2024   Hyperkalemia 03/20/2024   Generalized anxiety disorder 03/20/2024   Chronic pain syndrome 03/20/2024   History of abdominal aortic aneurysm (AAA) 03/20/2024   HLD (hyperlipidemia) 03/20/2024   Narcotic dependence (HCC) 03/20/2024   Peripheral neuropathy 03/20/2024   Reactive airway disease 03/20/2024   Insomnia 03/20/2024   Unspecified open wound, right foot, initial encounter 05/14/2023   Pneumonia 11/16/2022   Acute respiratory failure with hypoxia (HCC) 11/16/2022   Sepsis with acute organ dysfunction (HCC) 11/16/2022   Aspiration pneumonia (HCC) 11/16/2022   GERD without esophagitis 06/11/2022   Mixed diabetic hyperlipidemia associated with type 2 diabetes mellitus (HCC) 06/11/2022   Adjustment disorder with depressed mood 05/23/2022   Chronic hyponatremia 12/09/2021   Uncontrolled type 2 diabetes mellitus with hyperglycemia, without long-term current use of insulin  (HCC) 12/09/2021   Tachycardia 12/09/2021   Acute kidney injury superimposed on  chronic kidney disease (HCC) 10/04/2021   S/P BKA (below knee amputation) unilateral, left (HCC) 04/05/2021   Insulin  dependent type 2 diabetes mellitus (HCC) 02/03/2021   Essential hypertension 02/03/2021   Diabetic polyneuropathy associated with type 2 diabetes mellitus (HCC) 02/03/2021    Orientation RESPIRATION BLADDER Height & Weight     Self, Time, Situation, Place  Normal Continent Weight: 280 lb 6.8 oz (127.2 kg) Height:  6' 2 (188 cm)  BEHAVIORAL SYMPTOMS/MOOD NEUROLOGICAL BOWEL NUTRITION STATUS      Continent Diet (Carb Motified)  AMBULATORY STATUS COMMUNICATION OF NEEDS Skin   Limited Assist Verbally Normal                       Personal Care Assistance Level of Assistance  Bathing, Dressing, Feeding Bathing Assistance: Limited assistance Feeding assistance: Independent Dressing Assistance: Limited assistance     Functional Limitations Info  Sight, Hearing, Speech Sight Info: Adequate Hearing Info: Adequate Speech Info: Adequate    SPECIAL CARE FACTORS FREQUENCY                       Contractures Contractures Info: Not present    Additional Factors Info  Code Status, Allergies Code Status Info: FULL Allergies Info: NKA           Current Medications (04/19/2024):  This is the current hospital active medication list Current Facility-Administered Medications  Medication Dose Route Frequency Provider Last Rate Last Admin   acetaminophen  (TYLENOL ) tablet 650 mg  650 mg Oral Q6H PRN Opyd, Timothy S, MD   650 mg at 04/16/24 2137   Or   acetaminophen  (TYLENOL ) suppository 650 mg  650 mg Rectal Q6H  PRN Opyd, Timothy S, MD       albuterol  (PROVENTIL ) (2.5 MG/3ML) 0.083% nebulizer solution 2.5 mg  2.5 mg Nebulization Q2H PRN Cheryle, Kshitiz, MD       amLODipine  (NORVASC ) tablet 10 mg  10 mg Oral Daily Cheryle, Kshitiz, MD   10 mg at 04/18/24 0825   atorvastatin  (LIPITOR) tablet 40 mg  40 mg Oral QHS Opyd, Timothy S, MD   40 mg at 04/18/24 2219    brimonidine  (ALPHAGAN ) 0.2 % ophthalmic solution 1 drop  1 drop Left Eye BID Opyd, Timothy S, MD   1 drop at 04/18/24 2223   budesonide  (PULMICORT ) nebulizer solution 0.5 mg  0.5 mg Nebulization BID Cheryle Page, MD   0.5 mg at 04/19/24 0753   Chlorhexidine  Gluconate Cloth 2 % PADS 6 each  6 each Topical Daily Opyd, Timothy S, MD   6 each at 04/18/24 1045   diclofenac  Sodium (VOLTAREN ) 1 % topical gel 4 g  4 g Topical QID Alekh, Kshitiz, MD   4 g at 04/18/24 1319   DULoxetine  (CYMBALTA ) DR capsule 60 mg  60 mg Oral q AM Cheryle Page, MD   60 mg at 04/19/24 0735   feeding supplement (ENSURE PLUS HIGH PROTEIN) liquid 237 mL  237 mL Oral BID BM Opyd, Timothy S, MD   237 mL at 04/18/24 1320   heparin  injection 5,000 Units  5,000 Units Subcutaneous Q8H Opyd, Timothy S, MD   5,000 Units at 04/19/24 0736   hydrALAZINE  (APRESOLINE ) tablet 25 mg  25 mg Oral Q6H PRN Cheryle Page, MD       HYDROmorphone  (DILAUDID ) injection 0.5 mg  0.5 mg Intravenous BID PRN Briana Elgin LABOR, MD   0.5 mg at 04/19/24 0340   HYDROmorphone  (DILAUDID ) tablet 4 mg  4 mg Oral Q6H PRN Cheryle Page, MD   4 mg at 04/19/24 0738   insulin  aspart (novoLOG ) injection 0-5 Units  0-5 Units Subcutaneous QHS Opyd, Timothy S, MD   3 Units at 04/18/24 2221   insulin  aspart (novoLOG ) injection 0-6 Units  0-6 Units Subcutaneous TID WC Opyd, Timothy S, MD   2 Units at 04/19/24 0847   insulin  aspart (novoLOG ) injection 2 Units  2 Units Subcutaneous TID WC Briana Elgin LABOR, MD   2 Units at 04/19/24 0848   insulin  glargine-yfgn (SEMGLEE ) injection 10 Units  10 Units Subcutaneous Daily Briana Elgin LABOR, MD   10 Units at 04/18/24 9171   ipratropium-albuterol  (DUONEB) 0.5-2.5 (3) MG/3ML nebulizer solution 3 mL  3 mL Nebulization BID Cheryle, Kshitiz, MD   3 mL at 04/19/24 0753   lidocaine  (LIDODERM ) 5 % 1 patch  1 patch Transdermal Q24H Cheryle Page, MD   1 patch at 04/18/24 1033   methocarbamol  (ROBAXIN ) tablet 750 mg  750 mg Oral Q6H PRN Cheryle Page, MD   750 mg at 04/18/24 2243   nitroGLYCERIN  (NITROSTAT ) SL tablet 0.4 mg  0.4 mg Sublingual Q5 min PRN Cheryle Page, MD       ondansetron  (ZOFRAN ) tablet 4 mg  4 mg Oral Q6H PRN Opyd, Timothy S, MD       Or   ondansetron  (ZOFRAN ) injection 4 mg  4 mg Intravenous Q6H PRN Opyd, Timothy S, MD   4 mg at 04/13/24 1752   Oral care mouth rinse  15 mL Mouth Rinse PRN Opyd, Evalene RAMAN, MD       oxyCODONE  (Oxy IR/ROXICODONE ) immediate release tablet 5-10 mg  5-10 mg Oral Q4H PRN Alekh,  Kshitiz, MD   10 mg at 04/18/24 1807   pregabalin  (LYRICA ) capsule 100 mg  100 mg Oral BID Cheryle Page, MD   100 mg at 04/18/24 2219   senna (SENOKOT) tablet 8.6 mg  1 tablet Oral Daily PRN Opyd, Timothy S, MD   8.6 mg at 04/16/24 2138   sertraline  (ZOLOFT ) tablet 50 mg  50 mg Oral Daily Opyd, Timothy S, MD   50 mg at 04/18/24 0825   sodium chloride  flush (NS) 0.9 % injection 10-40 mL  10-40 mL Intracatheter Q12H Alekh, Kshitiz, MD   10 mL at 04/19/24 0849   sodium chloride  flush (NS) 0.9 % injection 10-40 mL  10-40 mL Intracatheter PRN Cheryle Page, MD       sodium chloride  flush (NS) 0.9 % injection 3 mL  3 mL Intravenous Q12H Opyd, Timothy S, MD   3 mL at 04/19/24 0850   tamsulosin  (FLOMAX ) capsule 0.4 mg  0.4 mg Oral QPC supper Cheryle Page, MD   0.4 mg at 04/18/24 1807   traZODone  (DESYREL ) tablet 75 mg  75 mg Oral QHS Opyd, Timothy S, MD   75 mg at 04/17/24 2143     Discharge Medications:  acetaminophen  325 MG tablet Commonly known as: TYLENOL  Take 2 tablets (650 mg total) by mouth every 6 (six) hours as needed for mild pain (or Fever >/= 101).    amLODipine  10 MG tablet Commonly known as: NORVASC  Take 10 mg by mouth every morning.    atorvastatin  40 MG tablet Commonly known as: LIPITOR Take 40 mg by mouth at bedtime.    bacitracin -polymyxin b  ophthalmic ointment Commonly known as: POLYSPORIN  Place into the right eye 4 (four) times daily. Place a 1/2 inch ribbon of ointment into the  lower eyelid 4 a day and as needed for discomfort    bisacodyl  5 MG EC tablet Commonly known as: DULCOLAX Take 10 mg by mouth daily as needed (CONSTIPATION).    brimonidine  0.2 % ophthalmic solution Commonly known as: ALPHAGAN  Place 1 drop into the left eye in the morning and at bedtime.    carboxymethylcellulose 1 % ophthalmic solution Place 4 drops into the left eye in the morning, at noon, in the evening, and at bedtime.    cyclobenzaprine  5 MG tablet Commonly known as: FLEXERIL  Take 1 tablet (5 mg total) by mouth 3 (three) times daily as needed for muscle spasms.    diclofenac  Sodium 1 % Gel Commonly known as: VOLTAREN  Apply 4 g topically 4 (four) times daily.    DULoxetine  60 MG capsule Commonly known as: CYMBALTA  Take 60 mg by mouth in the morning.    ergocalciferol  1.25 MG (50000 UT) capsule Commonly known as: VITAMIN D2 Take 50,000 Units by mouth once a week. Fridays    famotidine  20 MG tablet Commonly known as: PEPCID  Take 20 mg by mouth in the morning.    Farxiga  10 MG Tabs tablet Generic drug: dapagliflozin  propanediol Take 10 mg by mouth every morning. Dapaglifozin    feeding supplement Liqd Take 237 mLs by mouth 2 (two) times daily between meals.    furosemide  20 MG tablet Commonly known as: LASIX  Take 20 mg by mouth every morning.    glipiZIDE  10 MG tablet Commonly known as: GLUCOTROL  Take 10 mg by mouth every morning.    guaiFENesin  100 MG/5ML liquid Commonly known as: ROBITUSSIN Take 10 mLs by mouth every 4 (four) hours as needed for cough or to loosen phlegm.    HYDROmorphone  4 MG  tablet Commonly known as: DILAUDID  Take 1 tablet (4 mg total) by mouth every 6 (six) hours as needed for moderate pain (pain score 4-6) or severe pain (pain score 7-10).    insulin  lispro 100 UNIT/ML KwikPen Commonly known as: HUMALOG Inject 2-8 Units into the skin 3 (three) times daily with meals. Sliding scale 201-250=2 units 251-300=4 units 301-350=6  units 351-400= 8 units 401 or greater than=call [facility]    Linzess  145 MCG Caps capsule Generic drug: linaclotide  Take 145 mcg by mouth in the morning.    losartan  100 MG tablet Commonly known as: COZAAR  Take 0.5 tablets (50 mg total) by mouth in the morning.    Melatonin 5 MG Caps Take 10 mg by mouth at bedtime.    Milk of Magnesia 1200 MG/15ML suspension Generic drug: magnesium  hydroxide Take 30 mLs by mouth daily as needed for mild constipation.    naloxone  4 MG/0.1ML Liqd nasal spray kit Commonly known as: NARCAN  Place 1 spray into the nose See admin instructions. Every 2-3 minutesas needed until patient response/ emba arrived.    pantoprazole  40 MG tablet Commonly known as: PROTONIX  Take 1 tablet (40 mg total) by mouth daily. What changed: when to take this    polyethylene glycol 17 g packet Commonly known as: MIRALAX  / GLYCOLAX  Take 17 g by mouth daily. What changed:  when to take this additional instructions    pregabalin  100 MG capsule Commonly known as: LYRICA  Take 1 capsule (100 mg total) by mouth 2 (two) times daily.    sertraline  50 MG tablet Commonly known as: ZOLOFT  Take 50 mg by mouth in the morning.    tamsulosin  0.4 MG Caps capsule Commonly known as: FLOMAX  Take 1 capsule (0.4 mg total) by mouth daily after supper.    traZODone  150 MG tablet Commonly known as: DESYREL  Take 75 mg by mouth at bedtime.    Trulicity  1.5 MG/0.5ML Soaj Generic drug: Dulaglutide  Inject 0.5 mg into the skin once a week. On Thursdays    Relevant Imaging Results:  Relevant Lab Results:   Additional Information SSN: [id number]  Heather DELENA Saltness, LCSW

## 2024-04-19 NOTE — Plan of Care (Signed)
  Problem: Education: Goal: Ability to describe self-care measures that may prevent or decrease complications (Diabetes Survival Skills Education) will improve Outcome: Adequate for Discharge Goal: Individualized Educational Video(s) Outcome: Adequate for Discharge   Problem: Coping: Goal: Ability to adjust to condition or change in health will improve Outcome: Adequate for Discharge   Problem: Fluid Volume: Goal: Ability to maintain a balanced intake and output will improve Outcome: Adequate for Discharge   Problem: Health Behavior/Discharge Planning: Goal: Ability to identify and utilize available resources and services will improve Outcome: Adequate for Discharge Goal: Ability to manage health-related needs will improve Outcome: Adequate for Discharge   Problem: Metabolic: Goal: Ability to maintain appropriate glucose levels will improve Outcome: Adequate for Discharge   Problem: Nutritional: Goal: Maintenance of adequate nutrition will improve Outcome: Adequate for Discharge Goal: Progress toward achieving an optimal weight will improve Outcome: Adequate for Discharge   Problem: Skin Integrity: Goal: Risk for impaired skin integrity will decrease Outcome: Adequate for Discharge   Problem: Tissue Perfusion: Goal: Adequacy of tissue perfusion will improve Outcome: Adequate for Discharge   Problem: Fluid Volume: Goal: Hemodynamic stability will improve Outcome: Adequate for Discharge   Problem: Clinical Measurements: Goal: Diagnostic test results will improve Outcome: Adequate for Discharge Goal: Signs and symptoms of infection will decrease Outcome: Adequate for Discharge   Problem: Respiratory: Goal: Ability to maintain adequate ventilation will improve Outcome: Adequate for Discharge   Problem: Education: Goal: Knowledge of General Education information will improve Description: Including pain rating scale, medication(s)/side effects and non-pharmacologic  comfort measures Outcome: Adequate for Discharge   Problem: Health Behavior/Discharge Planning: Goal: Ability to manage health-related needs will improve Outcome: Adequate for Discharge   Problem: Clinical Measurements: Goal: Ability to maintain clinical measurements within normal limits will improve Outcome: Adequate for Discharge Goal: Will remain free from infection Outcome: Adequate for Discharge Goal: Diagnostic test results will improve Outcome: Adequate for Discharge Goal: Respiratory complications will improve Outcome: Adequate for Discharge Goal: Cardiovascular complication will be avoided Outcome: Adequate for Discharge   Problem: Activity: Goal: Risk for activity intolerance will decrease Outcome: Adequate for Discharge   Problem: Nutrition: Goal: Adequate nutrition will be maintained Outcome: Adequate for Discharge   Problem: Coping: Goal: Level of anxiety will decrease Outcome: Adequate for Discharge   Problem: Elimination: Goal: Will not experience complications related to bowel motility Outcome: Adequate for Discharge Goal: Will not experience complications related to urinary retention Outcome: Adequate for Discharge   Problem: Pain Managment: Goal: General experience of comfort will improve and/or be controlled Outcome: Adequate for Discharge   Problem: Safety: Goal: Ability to remain free from injury will improve Outcome: Adequate for Discharge   Problem: Skin Integrity: Goal: Risk for impaired skin integrity will decrease Outcome: Adequate for Discharge

## 2024-04-19 NOTE — TOC Transition Note (Signed)
 Transition of Care [facility]) - Discharge Note   Patient Details  Name: Brett White MRN: [id number] Date of Birth: [birth year]  [facility] CM/SW Contact:  Heather DELENA Saltness, LCSW Phone Number: 04/19/2024, 10:20 AM   Clinical Narrative:    Pt to discharge back to [facility] LTC today. Pt accepted to room 201. FL2 and d/c summary sent to [facility]. D/C packet with signed prescriptions placed in pt's chart at RN station. RN to call report to [phone]. PTAR called at 12:34 PM. Pt in agreement with discharge plan. No further TOC needs at this time.   Final next level of care: Assisted Living Barriers to Discharge: Barriers Resolved   Patient Goals and CMS Choice Patient states their goals for this hospitalization and ongoing recovery are:: To [facility] to [facility].gov Compare Post Acute Care list provided to:: Other (Comment Required) (NA) Choice offered to / list presented to : NA [place] ownership interest in [facility].provided to:: Parent NA    Discharge Placement  [facility]            Patient chooses bed at: [facility] Patient to be transferred to facility by: PTAR Name of family member notified: Patient Patient and family notified of of transfer: 04/19/24  Discharge Plan and Services Additional resources added to the After Visit Summary for  N/A In-house Referral: Clinical Social Work Discharge Planning Services: CM Consult Post Acute Care Choice: Nursing Home          DME Arranged: N/A DME Agency: NA       HH Arranged: NA HH Agency: NA        Social Drivers of Health (SDOH) Interventions SDOH Screenings   Food Insecurity: No Food Insecurity (04/12/2024)  Housing: Low Risk  (04/12/2024)  Transportation Needs: No Transportation Needs (04/12/2024)  Utilities: Not At Risk (04/12/2024)  Tobacco Use: Low Risk  (04/11/2024)     Readmission Risk Interventions    04/13/2024    3:04 PM 11/16/2022    2:57 PM   Readmission Risk Prevention Plan  Transportation Screening Complete Complete  Medication Review Oceanographer) Complete Complete  PCP or Specialist appointment within 3-5 days of discharge Complete Complete  HRI or Home Care Consult Complete Complete  SW Recovery Care/Counseling Consult Complete Complete  Palliative Care Screening Complete Complete  Skilled Nursing Facility Complete Complete

## 2024-04-20 ENCOUNTER — Emergency Department (HOSPITAL_COMMUNITY)

## 2024-04-20 ENCOUNTER — Inpatient Hospital Stay (HOSPITAL_COMMUNITY)
Admission: EM | Admit: 2024-04-20 | Discharge: 2024-04-27 | DRG: 193 | Disposition: A | Discharge status: Skilled Nursing Facility | Attending: Internal Medicine | Admitting: Internal Medicine

## 2024-04-20 DIAGNOSIS — K449 Diaphragmatic hernia without obstruction or gangrene: Secondary | ICD-10-CM | POA: Diagnosis present

## 2024-04-20 DIAGNOSIS — J690 Pneumonitis due to inhalation of food and vomit: Secondary | ICD-10-CM | POA: Diagnosis not present

## 2024-04-20 DIAGNOSIS — I130 Hypertensive heart and chronic kidney disease with heart failure and stage 1 through stage 4 chronic kidney disease, or unspecified chronic kidney disease: Secondary | ICD-10-CM | POA: Diagnosis present

## 2024-04-20 DIAGNOSIS — K224 Dyskinesia of esophagus: Secondary | ICD-10-CM | POA: Diagnosis present

## 2024-04-20 DIAGNOSIS — I2510 Atherosclerotic heart disease of native coronary artery without angina pectoris: Secondary | ICD-10-CM | POA: Diagnosis present

## 2024-04-20 DIAGNOSIS — N182 Chronic kidney disease, stage 2 (mild): Secondary | ICD-10-CM | POA: Diagnosis present

## 2024-04-20 DIAGNOSIS — E8809 Other disorders of plasma-protein metabolism, not elsewhere classified: Secondary | ICD-10-CM | POA: Diagnosis present

## 2024-04-20 DIAGNOSIS — E66811 Obesity, class 1: Secondary | ICD-10-CM | POA: Diagnosis present

## 2024-04-20 DIAGNOSIS — I272 Pulmonary hypertension, unspecified: Secondary | ICD-10-CM | POA: Diagnosis present

## 2024-04-20 DIAGNOSIS — Z7901 Long term (current) use of anticoagulants: Secondary | ICD-10-CM

## 2024-04-20 DIAGNOSIS — Z7985 Long-term (current) use of injectable non-insulin antidiabetic drugs: Secondary | ICD-10-CM

## 2024-04-20 DIAGNOSIS — I5032 Chronic diastolic (congestive) heart failure: Secondary | ICD-10-CM | POA: Diagnosis present

## 2024-04-20 DIAGNOSIS — G8929 Other chronic pain: Secondary | ICD-10-CM | POA: Diagnosis present

## 2024-04-20 DIAGNOSIS — J9601 Acute respiratory failure with hypoxia: Secondary | ICD-10-CM | POA: Diagnosis present

## 2024-04-20 DIAGNOSIS — R0789 Other chest pain: Secondary | ICD-10-CM | POA: Diagnosis present

## 2024-04-20 DIAGNOSIS — K2100 Gastro-esophageal reflux disease with esophagitis, without bleeding: Secondary | ICD-10-CM | POA: Diagnosis present

## 2024-04-20 DIAGNOSIS — J189 Pneumonia, unspecified organism: Principal | ICD-10-CM | POA: Diagnosis present

## 2024-04-20 DIAGNOSIS — I77810 Thoracic aortic ectasia: Secondary | ICD-10-CM | POA: Diagnosis present

## 2024-04-20 DIAGNOSIS — Z833 Family history of diabetes mellitus: Secondary | ICD-10-CM

## 2024-04-20 DIAGNOSIS — I10 Essential (primary) hypertension: Secondary | ICD-10-CM | POA: Diagnosis not present

## 2024-04-20 DIAGNOSIS — Z79899 Other long term (current) drug therapy: Secondary | ICD-10-CM

## 2024-04-20 DIAGNOSIS — E1142 Type 2 diabetes mellitus with diabetic polyneuropathy: Secondary | ICD-10-CM

## 2024-04-20 DIAGNOSIS — Z89512 Acquired absence of left leg below knee: Secondary | ICD-10-CM

## 2024-04-20 DIAGNOSIS — Z6833 Body mass index (BMI) 33.0-33.9, adult: Secondary | ICD-10-CM

## 2024-04-20 DIAGNOSIS — E1140 Type 2 diabetes mellitus with diabetic neuropathy, unspecified: Secondary | ICD-10-CM | POA: Diagnosis present

## 2024-04-20 DIAGNOSIS — E1122 Type 2 diabetes mellitus with diabetic chronic kidney disease: Secondary | ICD-10-CM | POA: Diagnosis present

## 2024-04-20 DIAGNOSIS — R1310 Dysphagia, unspecified: Secondary | ICD-10-CM | POA: Diagnosis not present

## 2024-04-20 DIAGNOSIS — D631 Anemia in chronic kidney disease: Secondary | ICD-10-CM | POA: Diagnosis present

## 2024-04-20 DIAGNOSIS — Z794 Long term (current) use of insulin: Secondary | ICD-10-CM | POA: Diagnosis not present

## 2024-04-20 DIAGNOSIS — R7401 Elevation of levels of liver transaminase levels: Secondary | ICD-10-CM | POA: Diagnosis present

## 2024-04-20 DIAGNOSIS — E119 Type 2 diabetes mellitus without complications: Secondary | ICD-10-CM | POA: Diagnosis not present

## 2024-04-20 DIAGNOSIS — E1165 Type 2 diabetes mellitus with hyperglycemia: Secondary | ICD-10-CM

## 2024-04-20 DIAGNOSIS — Z7984 Long term (current) use of oral hypoglycemic drugs: Secondary | ICD-10-CM

## 2024-04-20 DIAGNOSIS — Z8673 Personal history of transient ischemic attack (TIA), and cerebral infarction without residual deficits: Secondary | ICD-10-CM

## 2024-04-20 DIAGNOSIS — F419 Anxiety disorder, unspecified: Secondary | ICD-10-CM | POA: Diagnosis not present

## 2024-04-20 DIAGNOSIS — K222 Esophageal obstruction: Secondary | ICD-10-CM | POA: Diagnosis not present

## 2024-04-20 DIAGNOSIS — R0902 Hypoxemia: Principal | ICD-10-CM

## 2024-04-20 LAB — GLUCOSE, CAPILLARY: Glucose-Capillary: 316 mg/dL — ABNORMAL HIGH (ref 70–99)

## 2024-04-20 LAB — CBC
HCT: 37.3 % — ABNORMAL LOW (ref 39.0–52.0)
Hemoglobin: 11.5 g/dL — ABNORMAL LOW (ref 13.0–17.0)
MCH: 24.3 pg — ABNORMAL LOW (ref 26.0–34.0)
MCHC: 30.8 g/dL (ref 30.0–36.0)
MCV: 78.9 fL — ABNORMAL LOW (ref 80.0–100.0)
Platelets: 542 K/uL — ABNORMAL HIGH (ref 150–400)
RBC: 4.73 MIL/uL (ref 4.22–5.81)
RDW: 18.7 % — ABNORMAL HIGH (ref 11.5–15.5)
WBC: 7.1 K/uL (ref 4.0–10.5)
nRBC: 0 % (ref 0.0–0.2)

## 2024-04-20 LAB — COMPREHENSIVE METABOLIC PANEL WITH GFR
ALT: 128 U/L — ABNORMAL HIGH (ref 0–44)
AST: 134 U/L — ABNORMAL HIGH (ref 15–41)
Albumin: 3 g/dL — ABNORMAL LOW (ref 3.5–5.0)
Alkaline Phosphatase: 125 U/L (ref 38–126)
Anion gap: 13 (ref 5–15)
BUN: 22 mg/dL — ABNORMAL HIGH (ref 6–20)
CO2: 27 mmol/L (ref 22–32)
Calcium: 9.3 mg/dL (ref 8.9–10.3)
Chloride: 95 mmol/L — ABNORMAL LOW (ref 98–111)
Creatinine, Ser: 1.13 mg/dL (ref 0.61–1.24)
GFR, Estimated: >60 mL/min (ref >60)
Glucose, Bld: 280 mg/dL — ABNORMAL HIGH (ref 70–99)
Potassium: 4.9 mmol/L (ref 3.5–5.1)
Sodium: 135 mmol/L (ref 135–145)
Total Bilirubin: 0.5 mg/dL (ref 0.0–1.2)
Total Protein: 7.4 g/dL (ref 6.5–8.1)

## 2024-04-20 LAB — URINALYSIS, ROUTINE W REFLEX MICROSCOPIC
Bacteria, UA: NONE SEEN
Bilirubin Urine: NEGATIVE
Glucose, UA: 50 mg/dL — AB
Hgb urine dipstick: NEGATIVE
Ketones, ur: NEGATIVE mg/dL
Leukocytes,Ua: NEGATIVE
Nitrite: NEGATIVE
Protein, ur: 30 mg/dL — AB
Specific Gravity, Urine: 1.012 (ref 1.005–1.030)
pH: 8 (ref 5.0–8.0)

## 2024-04-20 LAB — BRAIN NATRIURETIC PEPTIDE: B Natriuretic Peptide: 28.4 pg/mL (ref 0.0–100.0)

## 2024-04-20 LAB — CBG MONITORING, ED
Glucose-Capillary: 284 mg/dL — ABNORMAL HIGH (ref 70–99)
Glucose-Capillary: 302 mg/dL — ABNORMAL HIGH (ref 70–99)

## 2024-04-20 LAB — RAPID URINE DRUG SCREEN, HOSP PERFORMED
Amphetamines: NOT DETECTED
Barbiturates: NOT DETECTED
Benzodiazepines: NOT DETECTED
Cocaine: NOT DETECTED
Opiates: POSITIVE — AB
Tetrahydrocannabinol: NOT DETECTED

## 2024-04-20 LAB — TROPONIN I (HIGH SENSITIVITY)
Troponin I (High Sensitivity): 7 ng/L (ref <18)
Troponin I (High Sensitivity): 6 ng/L (ref <18)

## 2024-04-20 LAB — HEMOGLOBIN A1C
Hgb A1c MFr Bld: 9.1 % — ABNORMAL HIGH (ref 4.8–5.6)
Mean Plasma Glucose: 214.47 mg/dL

## 2024-04-20 MED ORDER — HYDROMORPHONE HCL 1 MG/ML IJ SOLN: 1.0000 mg | INTRAMUSCULAR | Status: DC | PRN

## 2024-04-20 MED ORDER — HYDROMORPHONE HCL 1 MG/ML IJ SOLN
2.0000 mg | INTRAMUSCULAR | Status: DC | PRN
  Administered 2024-04-20 – 2024-04-21 (×3): 2 mg via INTRAVENOUS
  Filled 2024-04-20 (×3): qty 2

## 2024-04-20 MED ORDER — SERTRALINE HCL 50 MG PO TABS
50.0000 mg | ORAL_TABLET | Freq: Every morning | ORAL | Status: DC
  Administered 2024-04-21 – 2024-04-27 (×7): 50 mg via ORAL
  Filled 2024-04-20 (×7): qty 1

## 2024-04-20 MED ORDER — CARBOXYMETHYLCELLULOSE SODIUM 1 % OP SOLN: 4.0000 [drp] | Freq: Four times a day (QID) | OPHTHALMIC | Status: DC

## 2024-04-20 MED ORDER — HYDROMORPHONE HCL 2 MG PO TABS
2.0000 mg | ORAL_TABLET | Freq: Four times a day (QID) | ORAL | Status: DC | PRN
  Administered 2024-04-20: 2 mg via ORAL
  Filled 2024-04-20: qty 1

## 2024-04-20 MED ORDER — FAMOTIDINE 20 MG PO TABS
20.0000 mg | ORAL_TABLET | Freq: Every morning | ORAL | Status: DC
Start: 2024-04-21 — End: 2024-04-24
  Administered 2024-04-21 – 2024-04-24 (×4): 20 mg via ORAL
  Filled 2024-04-20 (×4): qty 1

## 2024-04-20 MED ORDER — PREGABALIN 100 MG PO CAPS
100.0000 mg | ORAL_CAPSULE | Freq: Two times a day (BID) | ORAL | Status: DC
Start: 2024-04-20 — End: 2024-04-28
  Administered 2024-04-20 – 2024-04-27 (×15): 100 mg via ORAL
  Filled 2024-04-20 (×15): qty 1

## 2024-04-20 MED ORDER — POLYVINYL ALCOHOL 1.4 % OP SOLN
1.0000 [drp] | Freq: Four times a day (QID) | OPHTHALMIC | Status: DC
  Administered 2024-04-20 – 2024-04-27 (×26): 1 [drp] via OPHTHALMIC
  Filled 2024-04-20: qty 15

## 2024-04-20 MED ORDER — ENOXAPARIN SODIUM 60 MG/0.6ML IJ SOSY
60.0000 mg | PREFILLED_SYRINGE | INTRAMUSCULAR | Status: AC
  Administered 2024-04-20 – 2024-04-23 (×4): 60 mg via SUBCUTANEOUS
  Filled 2024-04-20 (×4): qty 0.6

## 2024-04-20 MED ORDER — AMLODIPINE BESYLATE 10 MG PO TABS
10.0000 mg | ORAL_TABLET | Freq: Every morning | ORAL | Status: DC
  Administered 2024-04-20 – 2024-04-27 (×8): 10 mg via ORAL
  Filled 2024-04-20 (×2): qty 1
  Filled 2024-04-20: qty 2
  Filled 2024-04-20 (×5): qty 1

## 2024-04-20 MED ORDER — IPRATROPIUM-ALBUTEROL 0.5-2.5 (3) MG/3ML IN SOLN
3.0000 mL | Freq: Three times a day (TID) | RESPIRATORY_TRACT | Status: DC
  Filled 2024-04-20: qty 3

## 2024-04-20 MED ORDER — INSULIN GLARGINE-YFGN 100 UNIT/ML ~~LOC~~ SOLN
10.0000 [IU] | Freq: Every day | SUBCUTANEOUS | Status: DC
  Administered 2024-04-20: 10 [IU] via SUBCUTANEOUS
  Filled 2024-04-20 (×2): qty 0.1

## 2024-04-20 MED ORDER — IPRATROPIUM-ALBUTEROL 0.5-2.5 (3) MG/3ML IN SOLN
3.0000 mL | RESPIRATORY_TRACT | Status: DC
  Administered 2024-04-20 (×2): 3 mL via RESPIRATORY_TRACT
  Filled 2024-04-20 (×2): qty 3

## 2024-04-20 MED ORDER — INSULIN ASPART 100 UNIT/ML IJ SOLN
0.0000 [IU] | Freq: Three times a day (TID) | INTRAMUSCULAR | Status: DC
  Administered 2024-04-20: 11 [IU] via SUBCUTANEOUS
  Administered 2024-04-21: 8 [IU] via SUBCUTANEOUS
  Administered 2024-04-21 – 2024-04-22 (×3): 11 [IU] via SUBCUTANEOUS
  Administered 2024-04-22: 5 [IU] via SUBCUTANEOUS
  Administered 2024-04-22: 2 [IU] via SUBCUTANEOUS
  Administered 2024-04-23: 8 [IU] via SUBCUTANEOUS
  Administered 2024-04-23: 2 [IU] via SUBCUTANEOUS
  Administered 2024-04-23: 11 [IU] via SUBCUTANEOUS
  Administered 2024-04-24 (×2): 3 [IU] via SUBCUTANEOUS
  Administered 2024-04-24: 5 [IU] via SUBCUTANEOUS
  Administered 2024-04-25: 11 [IU] via SUBCUTANEOUS
  Administered 2024-04-25: 5 [IU] via SUBCUTANEOUS
  Administered 2024-04-25: 8 [IU] via SUBCUTANEOUS
  Administered 2024-04-26: 5 [IU] via SUBCUTANEOUS
  Administered 2024-04-26 (×2): 11 [IU] via SUBCUTANEOUS
  Administered 2024-04-27: 8 [IU] via SUBCUTANEOUS
  Administered 2024-04-27: 5 [IU] via SUBCUTANEOUS
  Administered 2024-04-27: 8 [IU] via SUBCUTANEOUS

## 2024-04-20 MED ORDER — ATORVASTATIN CALCIUM 40 MG PO TABS
40.0000 mg | ORAL_TABLET | Freq: Every day | ORAL | Status: DC
  Administered 2024-04-20 – 2024-04-27 (×8): 40 mg via ORAL
  Filled 2024-04-20 (×8): qty 1

## 2024-04-20 MED ORDER — TAMSULOSIN HCL 0.4 MG PO CAPS
0.4000 mg | ORAL_CAPSULE | Freq: Every day | ORAL | Status: DC
  Administered 2024-04-20 – 2024-04-27 (×8): 0.4 mg via ORAL
  Filled 2024-04-20 (×8): qty 1

## 2024-04-20 MED ORDER — DULOXETINE HCL 60 MG PO CPEP
60.0000 mg | ORAL_CAPSULE | Freq: Every morning | ORAL | Status: DC
  Administered 2024-04-21 – 2024-04-27 (×7): 60 mg via ORAL
  Filled 2024-04-20 (×7): qty 1

## 2024-04-20 MED ORDER — HYDROMORPHONE HCL 2 MG PO TABS
4.0000 mg | ORAL_TABLET | Freq: Once | ORAL | Status: AC
  Administered 2024-04-20: 4 mg via ORAL
  Filled 2024-04-20: qty 2

## 2024-04-20 MED ORDER — FUROSEMIDE 10 MG/ML IJ SOLN
40.0000 mg | Freq: Two times a day (BID) | INTRAMUSCULAR | Status: DC
  Administered 2024-04-20 – 2024-04-21 (×2): 40 mg via INTRAVENOUS
  Filled 2024-04-20 (×2): qty 4

## 2024-04-20 MED ORDER — TRAZODONE HCL 50 MG PO TABS
75.0000 mg | ORAL_TABLET | Freq: Every day | ORAL | Status: DC
  Administered 2024-04-20 – 2024-04-27 (×8): 75 mg via ORAL
  Filled 2024-04-20 (×8): qty 2

## 2024-04-20 MED ORDER — BRIMONIDINE TARTRATE 0.2 % OP SOLN
1.0000 [drp] | Freq: Two times a day (BID) | OPHTHALMIC | Status: DC
  Administered 2024-04-20 – 2024-04-27 (×15): 1 [drp] via OPHTHALMIC
  Filled 2024-04-20: qty 5

## 2024-04-20 MED ORDER — PANTOPRAZOLE SODIUM 40 MG PO TBEC
40.0000 mg | DELAYED_RELEASE_TABLET | Freq: Every day | ORAL | Status: DC
  Administered 2024-04-21 – 2024-04-24 (×4): 40 mg via ORAL
  Filled 2024-04-20 (×4): qty 1

## 2024-04-20 NOTE — Progress Notes (Signed)
 TR[facility] on call was paged re: pt asking for specifically IV Dilaudid  multiple times an hour, see emar & note  Pt restless, pulling lines, gown ED was unable to obtain urine sample, pt able to urinate in urinal, sample sent to lab

## 2024-04-20 NOTE — Plan of Care (Addendum)
  Chronic pain syndrome Patient is stating that he takes Dilaudid  4 mg every 6 hour at home.  Reviewed PDMP queried showing that patient never refilled any Dilaudid  and part PDMP review previously refilled oxycodone  10 mg 05/14/2022 and Lyrica  100 mg in 05/14/2022.  It is highly possible that patient is getting Dilaudid  from other sources rather than from any medical sources. High tendency of pain medication seeking behavior. Currently he is getting Dilaudid  4 mg every 6 hour by mouth however requesting to give it IV 4 mg every 6 hour. I have instructed patient that I am not going to give as patient requested at that then we will continue some low-dose 2 mg every 3 hours as needed for severe pain control. Continue Lyrica  and duloxetine .

## 2024-04-20 NOTE — ED Notes (Signed)
 Patient transported to Ultrasound

## 2024-04-20 NOTE — Inpatient Diabetes Management (Signed)
 Inpatient Diabetes Program Recommendations  AACE/ADA: New Consensus Statement on Inpatient Glycemic Control (2015)  Target Ranges:  Prepandial:   less than 140 mg/dL      Peak postprandial:   less than 180 mg/dL (1-2 hours)      Critically ill patients:  140 - 180 mg/dL   Lab Results  Component Value Date   GLUCAP 284 (H) 04/20/2024   HGBA1C 8.1 (H) 11/21/2023    Review of Glycemic Control  Latest Reference Range & Units 04/20/24 09:17  Glucose-Capillary 70 - 99 mg/dL 715 (H)   Diabetes type 2 Outpatient Diabetes medications:  Trulicity  0.5 mg weekly Glipizide  10 mg QAM Humalog 2-8 units TID Farxiga  10 mg every day   Current orders for Inpatient glycemic control:  None Being evaluated in ED   Inpatient Diabetes Program Recommendations:   -   Semglee  10 units Q24 hours -   Novolog  0-9 units tid + hs   Note: just d/c'd on 7/20  Thanks,  Clotilda Bull RN, MSN, BC-ADM Inpatient Diabetes Coordinator Team Pager [phone] (8a-5p)

## 2024-04-20 NOTE — ED Notes (Signed)
CCMD notified

## 2024-04-20 NOTE — H&P (Addendum)
 History and Physical    Patient: Brett White FMW:[id number] DOB: August 20, 1961 DOA: 04/20/2024 DOS: the patient was seen and examined on 04/20/2024 PCP: [facility], [facility]  Patient coming from: Home  Chief Complaint:  Chief Complaint  Patient presents with   Chest Pain   Shortness of Breath   HPI: Brett White is a 63 y.o. male with medical history significant of D2M, HTN, and chronic pain p/w AHRF of unknown etiology.  Pt states that he was in his USOH until he woke up at 0900 this morning with 10/10 cp. Pt stated that it felt like an elephant was standing on his chest. He contacted the RN at his facility who activated EMS.   In the ED, pt tachycardic. Labs notable for glucose 368. CXR w/o focal consolidations. CT chest w/ bilateral pleural effusions. EDP requested admission for new 1L [place] O2 requirement.  Review of Systems: As mentioned in the history of present illness. All other systems reviewed and are negative. Past Medical History:  Diagnosis Date   CAP (community acquired pneumonia) 12/09/2021   CKD (chronic kidney disease)    GERD (gastroesophageal reflux disease)    HTN (hypertension)    Insulin  dependent type 2 diabetes mellitus (HCC)    Neuropathy    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   Stroke [facility])    Past Surgical History:  Procedure Laterality Date   ABDOMINAL AORTOGRAM W/LOWER EXTREMITY N/A 06/20/2023   Procedure: ABDOMINAL AORTOGRAM W/LOWER EXTREMITY;  Surgeon: Gretta Lonni PARAS, MD;  Location: [facility];  Service: Cardiovascular;  Laterality: N/A;   AMPUTATION Left 02/04/2021   Procedure: LEFT GREAT TOE AMPUTATION;  Surgeon: Harden Jerona GAILS, MD;  Location: [facility] OR;  Service: Orthopedics;  Laterality: Left;   AMPUTATION Left 02/10/2021   Procedure: LEFT FOOT 1ST RAY AMPUTATION;  Surgeon: Harden Jerona GAILS, MD;  Location: [facility] OR;  Service: Orthopedics;  Laterality: Left;   AMPUTATION Left 03/22/2021   Procedure: LEFT BELOW  KNEE AMPUTATION;  Surgeon: Harden Jerona GAILS, MD;  Location: [facility] OR;  Service: Orthopedics;  Laterality: Left;   APPENDECTOMY     BACK SURGERY     CATARACT EXTRACTION     I & D EXTREMITY Left 03/03/2021   Procedure: LEFT FOOT DEBRIDEMENT;  Surgeon: Harden Jerona GAILS, MD;  Location: [facility] OR;  Service: Orthopedics;  Laterality: Left;   INCISION AND DRAINAGE ABSCESS N/A 10/06/2021   Procedure: INCISION AND DRAINAGE BACK ABSCESS;  Surgeon: Signe Mitzie LABOR, MD;  Location: [facility] OR;  Service: General;  Laterality: N/A;   INJECTION OF SILICONE OIL Left 07/25/2023   Procedure: INJECTION OF SILICONE OIL;  Surgeon: Valdemar Rogue, MD;  Location: [facility] OR;  Service: Ophthalmology;  Laterality: Left;   MEMBRANE PEEL Left 07/25/2023   Procedure: MEMBRANE PEEL;  Surgeon: Valdemar Rogue, MD;  Location: [facility] OR;  Service: Ophthalmology;  Laterality: Left;   PARS PLANA VITRECTOMY Left 07/25/2023   Procedure: PARS PLANA VITRECTOMY WITH 25 GAUGE;  Surgeon: Valdemar Rogue, MD;  Location: [facility] OR;  Service: Ophthalmology;  Laterality: Left;   removal of back cyst     TONSILLECTOMY     Social History:  reports that he has never smoked. He has never used smokeless tobacco. He reports that he does not currently use alcohol . He reports that he does not currently use drugs.  No Known Allergies  Family History  Problem Relation Age of Onset   Diabetes Mother    Heart attack Neg Hx  Prior to Admission medications   Medication Sig Start Date End Date Taking? Authorizing Provider  acetaminophen  (TYLENOL ) 325 MG tablet Take 2 tablets (650 mg total) by mouth every 6 (six) hours as needed for mild pain (or Fever >/= 101). 06/13/22   Jadine Toribio SQUIBB, MD  amLODipine  (NORVASC ) 10 MG tablet Take 10 mg by mouth every morning. 05/11/22   [provider]  atorvastatin  (LIPITOR) 40 MG tablet Take 40 mg by mouth at bedtime. 11/07/21   [provider]  bacitracin -polymyxin b  (POLYSPORIN ) ophthalmic ointment Place into the  right eye 4 (four) times daily. Place a 1/2 inch ribbon of ointment into the lower eyelid 4 a day and as needed for discomfort 08/01/23   Valdemar Rogue, MD  bisacodyl  (DULCOLAX) 5 MG EC tablet Take 10 mg by mouth daily as needed (CONSTIPATION).    [provider]  brimonidine  (ALPHAGAN ) 0.2 % ophthalmic solution Place 1 drop into the left eye in the morning and at bedtime. 08/29/23   [provider]  carboxymethylcellulose 1 % ophthalmic solution Place 4 drops into the left eye in the morning, at noon, in the evening, and at bedtime.    [provider]  cyclobenzaprine  (FLEXERIL ) 5 MG tablet Take 1 tablet (5 mg total) by mouth 3 (three) times daily as needed for muscle spasms. 06/13/22   Jadine Toribio SQUIBB, MD  diclofenac  Sodium (VOLTAREN ) 1 % GEL Apply 4 g topically 4 (four) times daily. 03/28/24   Emil Share, DO  DULoxetine  (CYMBALTA ) 60 MG capsule Take 60 mg by mouth in the morning. 08/28/23   [provider]  ergocalciferol  (VITAMIN D2) 1.25 MG (50000 UT) capsule Take 50,000 Units by mouth once a week. Fridays    [provider]  famotidine  (PEPCID ) 20 MG tablet Take 20 mg by mouth in the morning. 04/20/23   [provider]  FARXIGA  10 MG TABS tablet Take 10 mg by mouth every morning. Dapaglifozin 02/07/22   [provider]  feeding supplement (ENSURE PLUS HIGH PROTEIN) LIQD Take 237 mLs by mouth 2 (two) times daily between meals. 04/19/24   Briana Elgin LABOR, MD  furosemide  (LASIX ) 20 MG tablet Take 20 mg by mouth every morning. 05/11/22   [provider]  glipiZIDE  (GLUCOTROL ) 10 MG tablet Take 10 mg by mouth every morning. 05/11/22   [provider]  guaiFENesin  (ROBITUSSIN) 100 MG/5ML liquid Take 10 mLs by mouth every 4 (four) hours as needed for cough or to loosen phlegm.    [provider]  HYDROmorphone  (DILAUDID ) 4 MG tablet Take 1 tablet (4 mg total) by mouth every 6 (six) hours as needed for moderate pain  (pain score 4-6) or severe pain (pain score 7-10). 04/19/24   Briana Elgin LABOR, MD  insulin  lispro (HUMALOG) 100 UNIT/ML KwikPen Inject 2-8 Units into the skin 3 (three) times daily with meals. Sliding scale 201-250=2 units 251-300=4 units 301-350=6 units 351-400= 8 units 401 or greater than=call [facility] 04/18/23   [provider]  LINZESS  145 MCG CAPS capsule Take 145 mcg by mouth in the morning. 06/11/23   [provider]  losartan  (COZAAR ) 100 MG tablet Take 0.5 tablets (50 mg total) by mouth in the morning. 03/24/24   Ezenduka, Nkeiruka J, MD  magnesium  hydroxide (MILK OF MAGNESIA) 400 MG/5ML suspension Take 30 mLs by mouth daily as needed for mild constipation.    [provider]  Melatonin 5 MG CAPS Take 10 mg by mouth at bedtime.  [provider]  naloxone  (NARCAN ) nasal spray 4 mg/0.1 mL Place 1 spray into the nose See admin instructions. Every 2-3 minutesas needed until patient response/ emba arrived. 11/18/22   Krishnan, Gokul, MD  pantoprazole  (PROTONIX ) 40 MG tablet Take 1 tablet (40 mg total) by mouth daily. Patient taking differently: Take 40 mg by mouth in the morning. 10/20/21   Ghimire, Donalda HERO, MD  polyethylene glycol (MIRALAX  / GLYCOLAX ) 17 g packet Take 17 g by mouth daily. Patient taking differently: Take 17 g by mouth in the morning. With 4 oz of fluids and drink 11/18/22   Krishnan, Gokul, MD  pregabalin  (LYRICA ) 100 MG capsule Take 1 capsule (100 mg total) by mouth 2 (two) times daily. 04/19/24   Briana Elgin LABOR, MD  sertraline  (ZOLOFT ) 50 MG tablet Take 50 mg by mouth in the morning. 04/30/23   [provider]  tamsulosin  (FLOMAX ) 0.4 MG CAPS capsule Take 1 capsule (0.4 mg total) by mouth daily after supper. 03/24/24 04/23/24  Ezenduka, Nkeiruka J, MD  traZODone  (DESYREL ) 150 MG tablet Take 75 mg by mouth at bedtime. 05/30/23   [provider]  TRULICITY  1.5 MG/0.5ML SOPN Inject 0.5 mg into the skin once a week. On Thursdays  04/04/23   [provider]    Physical Exam: Vitals:   04/20/24 0921 04/20/24 1045 04/20/24 1302 04/20/24 1400  BP:  135/84  134/78  Pulse:  93  88  Resp:  13  14  Temp:   98 F (36.7 C)   TempSrc:   Oral   SpO2: 97% 97%  95%  Weight:      Height:       General: Alert, oriented x3, resting comfortably in no acute distress Respiratory: Lungs clear to auscultation bilaterally with normal respiratory effort; no w/r/r Cardiovascular: Regular rate and rhythm w/o m/r/g   Data Reviewed:   Lab Results  Component Value Date   WBC 7.1 04/20/2024   HGB 11.5 (L) 04/20/2024   HCT 37.3 (L) 04/20/2024   MCV 78.9 (L) 04/20/2024   PLT 542 (H) 04/20/2024   Lab Results  Component Value Date   GLUCOSE 280 (H) 04/20/2024   CALCIUM  9.3 04/20/2024   NA 135 04/20/2024   K 4.9 04/20/2024   CO2 27 04/20/2024   CL 95 (L) 04/20/2024   BUN 22 (H) 04/20/2024   CREATININE 1.13 04/20/2024   Lab Results  Component Value Date   ALT 128 (H) 04/20/2024   AST 134 (H) 04/20/2024   ALKPHOS 125 04/20/2024   BILITOT 0.5 04/20/2024   Lab Results  Component Value Date   INR 1.2 04/11/2024   INR 1.0 02/16/2022   INR 1.0 12/09/2021    Radiology: US  Abdomen Limited RUQ (LIVER/GB) Result Date: 04/20/2024 CLINICAL DATA:  Upper abdominal pain EXAM: ULTRASOUND ABDOMEN LIMITED RIGHT UPPER QUADRANT COMPARISON:  None Available. FINDINGS: Gallbladder: No gallstones or wall thickening visualized. No sonographic Murphy sign noted by sonographer. Common bile duct: Diameter: 0.3 cm Liver: No focal lesion identified. Within normal limits in parenchymal echogenicity. Portal vein is patent on color Doppler imaging with normal direction of blood flow towards the liver. Other: None. IMPRESSION: 1. Normal hepatobiliary ultrasound. Electronically Signed   By: Ryan Salvage M.D.   On: 04/20/2024 14:42   DG Chest Portable 1 View Result Date: 04/20/2024 CLINICAL DATA:  Chest pain and shortness of breath EXAM:  PORTABLE CHEST 1 VIEW COMPARISON:  Chest radiograph dated 04/15/2024 FINDINGS: Low lung volumes with bronchovascular crowding. No  focal consolidations. No pleural effusion or pneumothorax. The heart size and mediastinal contours are within normal limits. No acute osseous abnormality. IMPRESSION: Low lung volumes with bronchovascular crowding. No focal consolidations. Electronically Signed   By: Limin  Xu M.D.   On: 04/20/2024 09:41    Assessment and Plan: 64M h/o D2M, HTN, and chronic pain p/w AHRF of unknown etiology.  AHRF Presumed HFpEF exacerbation Unclear etiology. BNP 28 (wnl). CXR w/o focal consolidations. CT chest with b/l pleural effusions. High suspicion for ADHF w/ depressed BNP due to obesity. -RD consulted for low sodium diet; apprec eval/recs -Continue supplemental O2; goal SpO2 >90 -IV lasix  40mg  BID for now; goal net neg 1-2L/d; strict I/Os; daily standing weights; K>4/Mg>2 -Duonebs q4h sch for now -Wean O2 as tolerated -Ambulatory pulse ox prior to d/c -Consider pulmonology consult pending improvement on inh and diuretic therapy  D2M -Diabetes coordinator consulted; recs; Semglee  10U daily and SSI TID AC prn -PTA atorvastatin  -F/u A1c  Chronic pain -PTA lyrica  100mg  BID, reduced dose Dilaudid  2mg  q6h (4mg  q6h pta), and duloxetine  60mg  daily  HTN -PTA amlodipine  10mg  daily  GERD -PTA PPI   Advance Care Planning:   Code Status: Prior   Consults: N/A  Family Communication: N/A  Severity of Illness: The appropriate patient status for this patient is INPATIENT. Inpatient status is judged to be reasonable and necessary in order to provide the required intensity of service to ensure the patient's safety. The patient's presenting symptoms, physical exam findings, and initial radiographic and laboratory data in the context of their chronic comorbidities is felt to place them at high risk for further clinical deterioration. Furthermore, it is not anticipated that the  patient will be medically stable for discharge from the hospital within 2 midnights of admission.   * I certify that at the point of admission it is my clinical judgment that the patient will require inpatient hospital care spanning beyond 2 midnights from the point of admission due to high intensity of service, high risk for further deterioration and high frequency of surveillance required.*   ------- I spent 55 minutes reviewing previous labs/notes, obtaining separate history at the bedside, counseling/discussing the treatment plan outlined above, ordering medications/tests, and performing clinical documentation.  Author: Marsha Ada, MD 04/20/2024 2:58 PM  For on call review [email or web address].

## 2024-04-20 NOTE — Progress Notes (Addendum)
 1815 Patient arrived from ED alert x4 on 2L [place] able to make all needs known patient states chest pain is worse when pushing on mid chest, patient states its throbbing pain. when I move it moves

## 2024-04-20 NOTE — ED Provider Notes (Addendum)
 [facility] Provider Note   CSN: [id number] Arrival date & time: 04/20/24  9090     Patient presents with: Chest Pain and Shortness of Breath   Brett White is a 63 y.o. male who presents to the ED today secondary to substernal chest pressure along with acute dyspnea.  Discharged from this facility yesterday, was admitted secondary to urosepsis, also had acute hypoxic respiratory failure.  According to EMS, his oxygen saturations at the facility which he stays was in the mid 70s, they applied nasal cannula oxygen which did return him to oxygen saturations above 90%.  He also was provided with a albuterol  treatment, has had 0.4 mg of nitroglycerin  x 4 prior to his arrival to the ED with no improvement in his symptoms.  Has also been provided with 324 mg of aspirin  PTA.  According the patient, he states that he feels like he is suffocating, cannot catch his breath.  Denies having any cough or productive sputum.  Symptoms began while he was at rest in bed, has not ambulated and is not able to appreciate if there is exertional dyspnea however exacerbation of his pain with activity.  Previous diagnoses are remarkable for type 2 diabetes, CKD stage II, hyperlipidemia, primary hypertension.    Chest Pain Associated symptoms: shortness of breath   Shortness of Breath Associated symptoms: chest pain        Prior to Admission medications   Medication Sig Start Date End Date Taking? Authorizing Provider  acetaminophen  (TYLENOL ) 325 MG tablet Take 2 tablets (650 mg total) by mouth every 6 (six) hours as needed for mild pain (or Fever >/= 101). 06/13/22   Jadine Toribio SQUIBB, MD  amLODipine  (NORVASC ) 10 MG tablet Take 10 mg by mouth every morning. 05/11/22   [provider]  atorvastatin  (LIPITOR) 40 MG tablet Take 40 mg by mouth at bedtime. 11/07/21   [provider]  bacitracin -polymyxin b  (POLYSPORIN ) ophthalmic ointment Place into the  right eye 4 (four) times daily. Place a 1/2 inch ribbon of ointment into the lower eyelid 4 a day and as needed for discomfort 08/01/23   Valdemar Rogue, MD  bisacodyl  (DULCOLAX) 5 MG EC tablet Take 10 mg by mouth daily as needed (CONSTIPATION).    [provider]  brimonidine  (ALPHAGAN ) 0.2 % ophthalmic solution Place 1 drop into the left eye in the morning and at bedtime. 08/29/23   [provider]  carboxymethylcellulose 1 % ophthalmic solution Place 4 drops into the left eye in the morning, at noon, in the evening, and at bedtime.    [provider]  cyclobenzaprine  (FLEXERIL ) 5 MG tablet Take 1 tablet (5 mg total) by mouth 3 (three) times daily as needed for muscle spasms. 06/13/22   Jadine Toribio SQUIBB, MD  diclofenac  Sodium (VOLTAREN ) 1 % GEL Apply 4 g topically 4 (four) times daily. 03/28/24   Emil Share, DO  DULoxetine  (CYMBALTA ) 60 MG capsule Take 60 mg by mouth in the morning. 08/28/23   [provider]  ergocalciferol  (VITAMIN D2) 1.25 MG (50000 UT) capsule Take 50,000 Units by mouth once a week. Fridays    [provider]  famotidine  (PEPCID ) 20 MG tablet Take 20 mg by mouth in the morning. 04/20/23   [provider]  FARXIGA  10 MG TABS tablet Take 10 mg by mouth every morning. Dapaglifozin 02/07/22   [provider]  feeding supplement (ENSURE PLUS HIGH PROTEIN) LIQD Take 237 mLs by mouth 2 (  two) times daily between meals. 04/19/24   Briana Elgin LABOR, MD  furosemide  (LASIX ) 20 MG tablet Take 20 mg by mouth every morning. 05/11/22   [provider]  glipiZIDE  (GLUCOTROL ) 10 MG tablet Take 10 mg by mouth every morning. 05/11/22   [provider]  guaiFENesin  (ROBITUSSIN) 100 MG/5ML liquid Take 10 mLs by mouth every 4 (four) hours as needed for cough or to loosen phlegm.    [provider]  HYDROmorphone  (DILAUDID ) 4 MG tablet Take 1 tablet (4 mg total) by mouth every 6 (six) hours as needed for moderate pain  (pain score 4-6) or severe pain (pain score 7-10). 04/19/24   Briana Elgin LABOR, MD  insulin  lispro (HUMALOG) 100 UNIT/ML KwikPen Inject 2-8 Units into the skin 3 (three) times daily with meals. Sliding scale 201-250=2 units 251-300=4 units 301-350=6 units 351-400= 8 units 401 or greater than=call [facility] 04/18/23   [provider]  LINZESS  145 MCG CAPS capsule Take 145 mcg by mouth in the morning. 06/11/23   [provider]  losartan  (COZAAR ) 100 MG tablet Take 0.5 tablets (50 mg total) by mouth in the morning. 03/24/24   Ezenduka, Nkeiruka J, MD  magnesium  hydroxide (MILK OF MAGNESIA) 400 MG/5ML suspension Take 30 mLs by mouth daily as needed for mild constipation.    [provider]  Melatonin 5 MG CAPS Take 10 mg by mouth at bedtime.    [provider]  naloxone  (NARCAN ) nasal spray 4 mg/0.1 mL Place 1 spray into the nose See admin instructions. Every 2-3 minutesas needed until patient response/ emba arrived. 11/18/22   Krishnan, Gokul, MD  pantoprazole  (PROTONIX ) 40 MG tablet Take 1 tablet (40 mg total) by mouth daily. Patient taking differently: Take 40 mg by mouth in the morning. 10/20/21   Ghimire, Donalda HERO, MD  polyethylene glycol (MIRALAX  / GLYCOLAX ) 17 g packet Take 17 g by mouth daily. Patient taking differently: Take 17 g by mouth in the morning. With 4 oz of fluids and drink 11/18/22   Krishnan, Gokul, MD  pregabalin  (LYRICA ) 100 MG capsule Take 1 capsule (100 mg total) by mouth 2 (two) times daily. 04/19/24   Briana Elgin LABOR, MD  sertraline  (ZOLOFT ) 50 MG tablet Take 50 mg by mouth in the morning. 04/30/23   [provider]  tamsulosin  (FLOMAX ) 0.4 MG CAPS capsule Take 1 capsule (0.4 mg total) by mouth daily after supper. 03/24/24 04/23/24  Ezenduka, Nkeiruka J, MD  traZODone  (DESYREL ) 150 MG tablet Take 75 mg by mouth at bedtime. 05/30/23   [provider]  TRULICITY  1.5 MG/0.5ML SOPN Inject 0.5 mg into the skin once a week. On Thursdays  04/04/23   [provider]    Allergies: Patient has no known allergies.    Review of Systems  Respiratory:  Positive for shortness of breath.   Cardiovascular:  Positive for chest pain.  All other systems reviewed and are negative.   Updated Vital Signs BP 134/78   Pulse 88   Temp 98 F (36.7 C) (Oral)   Resp 14   Ht 6' 2 (1.88 m)   Wt 127.2 kg   SpO2 95%   BMI 36.00 kg/m   Physical Exam Vitals and nursing note reviewed.  Constitutional:      General: He is not in acute distress.    Appearance: Normal appearance.     Interventions: Nasal cannula in place.  HENT:     Head: Normocephalic and atraumatic.     Mouth/Throat:  Mouth: Mucous membranes are moist.     Pharynx: Oropharynx is clear.  Eyes:     Extraocular Movements: Extraocular movements intact.     Conjunctiva/sclera: Conjunctivae normal.     Pupils: Pupils are equal, round, and reactive to light.  Cardiovascular:     Rate and Rhythm: Regular rhythm. Tachycardia present.     Pulses: Normal pulses.     Heart sounds: Normal heart sounds. No murmur heard.    No friction rub. No gallop.  Pulmonary:     Effort: Pulmonary effort is normal.     Breath sounds: Normal breath sounds.  Abdominal:     General: Abdomen is flat. Bowel sounds are normal.     Palpations: Abdomen is soft.  Musculoskeletal:        General: Normal range of motion.     Cervical back: Normal range of motion and neck supple.     Right lower leg: No edema.     Comments: Noted left BKA.  Skin:    General: Skin is warm and dry.     Capillary Refill: Capillary refill takes less than 2 seconds.  Neurological:     General: No focal deficit present.     Mental Status: He is alert. Mental status is at baseline.  Psychiatric:        Mood and Affect: Mood normal.     (all labs ordered are listed, but only abnormal results are displayed) Labs Reviewed  CBC - Abnormal; Notable for the following components:      Result Value    Hemoglobin 11.5 (*)    HCT 37.3 (*)    MCV 78.9 (*)    MCH 24.3 (*)    RDW 18.7 (*)    Platelets 542 (*)    All other components within normal limits  COMPREHENSIVE METABOLIC PANEL WITH GFR - Abnormal; Notable for the following components:   Chloride 95 (*)    Glucose, Bld 280 (*)    BUN 22 (*)    Albumin  3.0 (*)    AST 134 (*)    ALT 128 (*)    All other components within normal limits  URINALYSIS, ROUTINE W REFLEX MICROSCOPIC - Abnormal; Notable for the following components:   Glucose, UA 50 (*)    Protein, ur 30 (*)    All other components within normal limits  CBG MONITORING, ED - Abnormal; Notable for the following components:   Glucose-Capillary 284 (*)    All other components within normal limits  BRAIN NATRIURETIC PEPTIDE  RAPID URINE DRUG SCREEN, HOSP PERFORMED  TROPONIN I (HIGH SENSITIVITY)  TROPONIN I (HIGH SENSITIVITY)    EKG: EKG Interpretation Date/Time:  Monday April 20 2024 09:15:25 EDT Ventricular Rate:  106 PR Interval:  151 QRS Duration:  95 QT Interval:  328 QTC Calculation: 436 R Axis:   -24  Text Interpretation: Sinus tachycardia Borderline left axis deviation Confirmed by Yolande Charleston [phone]) on 04/20/2024 10:00:02 AM  Radiology: US  Abdomen Limited RUQ (LIVER/GB) Result Date: 04/20/2024 CLINICAL DATA:  Upper abdominal pain EXAM: ULTRASOUND ABDOMEN LIMITED RIGHT UPPER QUADRANT COMPARISON:  None Available. FINDINGS: Gallbladder: No gallstones or wall thickening visualized. No sonographic Murphy sign noted by sonographer. Common bile duct: Diameter: 0.3 cm Liver: No focal lesion identified. Within normal limits in parenchymal echogenicity. Portal vein is patent on color Doppler imaging with normal direction of blood flow towards the liver. Other: None. IMPRESSION: 1. Normal hepatobiliary ultrasound. Electronically Signed   By: Ryan Ramond HERO.D.  On: 04/20/2024 14:42   DG Chest Portable 1 View Result Date: 04/20/2024 CLINICAL DATA:  Chest pain  and shortness of breath EXAM: PORTABLE CHEST 1 VIEW COMPARISON:  Chest radiograph dated 04/15/2024 FINDINGS: Low lung volumes with bronchovascular crowding. No focal consolidations. No pleural effusion or pneumothorax. The heart size and mediastinal contours are within normal limits. No acute osseous abnormality. IMPRESSION: Low lung volumes with bronchovascular crowding. No focal consolidations. Electronically Signed   By: Limin  Xu M.D.   On: 04/20/2024 09:41     Procedures   Medications Ordered in the ED  HYDROmorphone  (DILAUDID ) tablet 4 mg (4 mg Oral Given 04/20/24 1229)    Clinical Course as of 04/20/24 1445  Mon Apr 20, 2024  0942 Hemoglobin(!): 11.5 Noted, this is increased to previous results from his admission. [JG]  [id number] Glucose-Capillary(!): 284 Noted, this is stable, decreased as compared to previous from his admission. [JG]  1134 Reassessed the patient, he has no significant change in his status.  Remains on oxygen by nasal cannula at 1 L/min.  Discontinued oxygen at this time and will assess his status at room air.  Also will attempt to ambulate patient and determine his oxygenation status. [JG]  1158 Patient request pain medication, per outpatient records he is on Dilaudid  4 mg p.o., provided this as per his previous prescription. [JG]    Clinical Course User Index [JG] Myriam Dorn BROCKS, PA                                 Medical Decision Making Amount and/or Complexity of Data Reviewed Labs: ordered. Decision-making details documented in ED Course. Radiology: ordered.  Risk Prescription drug management. Decision regarding hospitalization.   Medical Decision Making:   Oluwatosin Higginson is a 63 y.o. male who presented to the ED today with chest pain and dyspnea detailed above.    Handoff received from EMS.  Complete initial physical exam performed, notably the patient  was alert and oriented in no apparent distress.   Physical exam as noted Reviewed and confirmed  nursing documentation for past medical history, family history, social history.    Initial Assessment:   With the patient's presentation of chest pain and dyspnea, differential diagnosis includes ACS/STEMI, pulmonary emboli, pneumothorax, pneumonia, fluid volume overload secondary to CKD.   Initial Plan:   Screening labs including CBC and Metabolic panel to evaluate for infectious or metabolic etiology of disease.  Urinalysis with reflex culture ordered to evaluate for UTI or relevant urologic/nephrologic pathology.  CXR to evaluate for structural/infectious intrathoracic pathology.  EKG/Troponin testing/BNP testing to evaluate for cardiac pathology. Objective evaluation as below reviewed   Initial Study Results:   Laboratory  All laboratory results reviewed without evidence of clinically relevant pathology.   Exceptions include: Hemoglobin is 11.5 which is increased compared to previous labs over the last week.  Glucose is 284 however this is stable compared to previous readings.  EKG EKG was reviewed independently. Rate, rhythm, axis, intervals all examined and without medically relevant abnormality. ST segments without concerns for elevations.    Radiology:  All images reviewed independently. Agree with radiology report at this time.   US  Abdomen Limited RUQ (LIVER/GB) Result Date: 04/20/2024 CLINICAL DATA:  Upper abdominal pain EXAM: ULTRASOUND ABDOMEN LIMITED RIGHT UPPER QUADRANT COMPARISON:  None Available. FINDINGS: Gallbladder: No gallstones or wall thickening visualized. No sonographic Murphy sign noted by sonographer. Common bile duct: Diameter: 0.3 cm Liver: No  focal lesion identified. Within normal limits in parenchymal echogenicity. Portal vein is patent on color Doppler imaging with normal direction of blood flow towards the liver. Other: None. IMPRESSION: 1. Normal hepatobiliary ultrasound. Electronically Signed   By: Ryan Salvage M.D.   On: 04/20/2024 14:42   DG Chest  Portable 1 View Result Date: 04/20/2024 CLINICAL DATA:  Chest pain and shortness of breath EXAM: PORTABLE CHEST 1 VIEW COMPARISON:  Chest radiograph dated 04/15/2024 FINDINGS: Low lung volumes with bronchovascular crowding. No focal consolidations. No pleural effusion or pneumothorax. The heart size and mediastinal contours are within normal limits. No acute osseous abnormality. IMPRESSION: Low lung volumes with bronchovascular crowding. No focal consolidations. Electronically Signed   By: Limin  Xu M.D.   On: 04/20/2024 09:41   CT RENAL STONE STUDY Result Date: 04/17/2024 CLINICAL DATA:  Right flank pain. EXAM: CT ABDOMEN AND PELVIS WITHOUT CONTRAST TECHNIQUE: Multidetector CT imaging of the abdomen and pelvis was performed following the standard protocol without IV contrast. RADIATION DOSE REDUCTION: This exam was performed according to the departmental dose-optimization program which includes automated exposure control, adjustment of the mA and/or kV according to patient size and/or use of iterative reconstruction technique. COMPARISON:  Renal ultrasound 04/15/2024 and CT abdomen pelvis 03/28/2024. FINDINGS: Lower chest: Dependent and scattered subsegmental atelectasis. Small bilateral pleural effusions. Heart is enlarged. No pericardial effusion. Distal esophagus is grossly unremarkable. Hepatobiliary: Liver and gallbladder are unremarkable. No biliary ductal dilatation. Pancreas: Negative. Spleen: Negative. Adrenals/Urinary Tract: Adrenal glands are unremarkable. Bilateral renal stones. Small low-attenuation lesion in the left kidney. No specific follow-up necessary. Increased bilateral perinephric edema with new stranding along the mid/distal ureters bilaterally. Ureters are decompressed. Bladder is grossly unremarkable. Stomach/Bowel: Stomach and small bowel are unremarkable. Appendix is not readily visualized. Stool throughout the colon. Vascular/Lymphatic: Atherosclerotic calcification of the aorta. No  pathologically enlarged lymph nodes. Reproductive: Prostate is visualized. Other: No free fluid. Tiny umbilical hernia contains fat. Mesenteries and peritoneum are otherwise unremarkable. Musculoskeletal: Degenerative changes in the spine. Interbody cages at L3-4 and L4-5. IMPRESSION: 1. Mild perinephric edema with mild stranding along the mid/distal ureters, new from 03/28/2024. No ureteral or bladder stones nor hydronephrosis. Please correlate with laboratory values for the possibility of pyelonephritis. 2. Bilateral renal stones. 3. Small bilateral pleural effusions with dependent and scattered subsegmental atelectasis. 4.  Aortic atherosclerosis (ICD10-I70.0). Electronically Signed   By: Newell Eke M.D.   On: 04/17/2024 09:08   ECHOCARDIOGRAM COMPLETE Result Date: 04/16/2024    ECHOCARDIOGRAM REPORT   Patient Name:   Brett White Date of Exam: 04/16/2024 Medical Rec #:  [id number]      Height:       74.0 in Accession #:    [id number]     Weight:       280.6 lb Date of Birth:  06-10-1961      BSA:          2.510 m Patient Age:    62 years       BP:           86/59 mmHg Patient Gender: M              HR:           88 bpm. Exam Location:  Inpatient Procedure: 2D Echo, Cardiac Doppler and Color Doppler (Both Spectral and Color            Flow Doppler were utilized during procedure). 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                 Abnormal ECG, Arrythmias:Tachycardia, Signs/Symptoms:Dyspnea and                 Shortness of Breath; Risk Factors:Hypertension, Dyslipidemia and                 Diabetes.  Sonographer:    Ellouise Mose RDCS Referring Phys: [phone] RALPH A NETTEY  Sonographer Comments: Technically difficult study due to poor echo windows and patient is obese. Image acquisition challenging due to patient body habitus. IMPRESSIONS  1. Left  ventricular ejection fraction, by estimation, is 55 to 60%. The left ventricle has normal function. The left ventricle has no regional wall motion abnormalities. Left ventricular diastolic parameters are indeterminate.  2. Right ventricular systolic function is low normal. The right ventricular size is moderately enlarged. Tricuspid regurgitation signal is inadequate for assessing PA pressure.  3. The mitral valve is normal in structure. Trivial mitral valve regurgitation. No evidence of mitral stenosis.  4. The aortic valve is tricuspid. Aortic valve regurgitation is not visualized. No aortic stenosis is present.  5. Aortic dilatation noted. There is dilatation of the ascending aorta, measuring 42 mm. There is dilatation of the aortic root, measuring 43 mm.  6. The inferior vena cava is dilated in size with <50% respiratory variability, suggesting right atrial pressure of 15 mmHg. Comparison(s): No prior Echocardiogram. FINDINGS  Left Ventricle: Left ventricular ejection fraction, by estimation, is 55 to 60%. The left ventricle has normal function. The left ventricle has no regional wall motion abnormalities. The left ventricular internal cavity size was normal in size. There is  no left ventricular hypertrophy. Left ventricular diastolic parameters are indeterminate. Right Ventricle: The right ventricular size is moderately enlarged. No increase in right ventricular wall thickness. Right ventricular systolic function is low normal. Tricuspid regurgitation signal is inadequate for assessing PA pressure. Left Atrium: Left atrial size was normal in size. Right Atrium: Right atrial size was normal in size. Pericardium: There is no evidence of pericardial effusion. Mitral Valve: The mitral valve is normal in structure. Trivial mitral valve regurgitation. No evidence of mitral valve stenosis. Tricuspid Valve: The tricuspid valve is normal in structure. Tricuspid valve regurgitation is trivial. No evidence of tricuspid  stenosis. Aortic Valve: The aortic valve is tricuspid. Aortic valve regurgitation is not visualized. No aortic stenosis is present. Pulmonic Valve: The pulmonic valve was normal in structure. Pulmonic valve regurgitation is not visualized. No evidence of pulmonic stenosis. Aorta: Aortic dilatation noted. There is dilatation of the ascending aorta, measuring 42 mm. There is dilatation of the aortic root, measuring 43 mm. Venous: The inferior vena cava is dilated in size with less than 50% respiratory variability, suggesting right atrial pressure of 15 mmHg. IAS/Shunts: There is right bowing of the interatrial septum, suggestive of elevated left atrial pressure. No atrial level shunt detected by color flow Doppler.  LEFT VENTRICLE PLAX 2D LVIDd:         5.00 cm      Diastology LVIDs:         3.70 cm      LV e' medial:    6.85 cm/s LV PW:         1.40 cm      LV E/e' medial:  13.6 LV IVS:        1.30 cm  LV e' lateral:   7.94 cm/s LVOT diam:     2.40 cm      LV E/e' lateral: 11.8 LV SV:         81 LV SV Index:   32 LVOT Area:     4.52 cm  LV Volumes (MOD) LV vol d, MOD A2C: 81.7 ml LV vol d, MOD A4C: 100.0 ml LV vol s, MOD A2C: 37.1 ml LV vol s, MOD A4C: 46.5 ml LV SV MOD A2C:     44.6 ml LV SV MOD A4C:     100.0 ml LV SV MOD BP:      48.5 ml RIGHT VENTRICLE             IVC RV S prime:     12.24 cm/s  IVC diam: 2.40 cm TAPSE (M-mode): 1.7 cm LEFT ATRIUM             Index       RIGHT ATRIUM           Index LA diam:        2.80 cm 1.12 cm/m  RA Area:     13.70 cm LA Vol (A2C):   22.0 ml 8.76 ml/m  RA Volume:   29.00 ml  11.55 ml/m LA Vol (A4C):   23.0 ml 9.16 ml/m LA Biplane Vol: 23.8 ml 9.48 ml/m  AORTIC VALVE LVOT Vmax:   99.20 cm/s LVOT Vmean:  62.400 cm/s LVOT VTI:    0.179 m  AORTA Ao Root diam: 4.30 cm Ao Asc diam:  4.10 cm MITRAL VALVE MV Area (PHT): 4.30 cm    SHUNTS MV Decel Time: 177 msec    Systemic VTI:  0.18 m MV E velocity: 93.45 cm/s  Systemic Diam: 2.40 cm MV A velocity: 97.40 cm/s MV E/A  ratio:  0.96 Morene Brownie Electronically signed by Morene Brownie Signature Date/Time: 04/16/2024/3:35:42 PM    Final    US  RENAL Result Date: 04/15/2024 CLINICAL DATA:  Acute kidney insufficiency EXAM: RENAL / URINARY TRACT ULTRASOUND COMPLETE COMPARISON:  CT scan 04/11/2024 FINDINGS: Right Kidney: Renal measurements: 13.7 x 6.6 x 6.3 cm = volume: 298.1 mL. Mild parenchymal atrophy. No collecting system dilatation or perinephric fluid. Shadowing lower pole right-sided renal stone measuring 19 mm. Left Kidney: Renal measurements: 13.8 x 7.2 x 6.4 cm = volume: 330.3 mL. Mild parenchymal atrophy. No collecting system dilatation or perinephric fluid. There is a central cystic area measuring 3 cm which was seen on the prior CT scan as a parapelvic cyst. Bladder: Appears normal for degree of bladder distention. Other: Limited by overlapping bowel gas and soft tissue. IMPRESSION: Bilateral renal atrophy without collecting system dilatation. Left-sided parapelvic renal cyst as seen on prior CT. Right-sided renal stone. Electronically Signed   By: Ranell Bring M.D.   On: 04/15/2024 17:10   DG Chest 2 View Result Date: 04/15/2024 CLINICAL DATA:  [id number] Acute respiratory failure with hypoxia (HCC) [id number] EXAM: CHEST - 2 VIEW COMPARISON:  None available. FINDINGS: Diffuse interstitial opacities throughout both lungs. No focal airspace consolidation or pneumothorax. Small bilateral pleural effusions. Moderate cardiomegaly. Tortuous aorta with aortic atherosclerosis. No acute fracture or destructive lesions. Multilevel thoracic osteophytosis. Right PICC terminates at the cavoatrial junction. IMPRESSION: Moderate cardiomegaly with similar findings of interstitial edema. Small bilateral pleural effusions. Electronically Signed   By: Rogelia Myers M.D.   On: 04/15/2024 11:42   DG CHEST PORT 1 VIEW Result Date: 04/14/2024 CLINICAL DATA:  Dyspnea EXAM: PORTABLE CHEST 1  VIEW COMPARISON:  Chest radiograph dated 04/11/2024  FINDINGS: Right upper extremity PICC tip projects over the SVC. Mildly low lung volumes. Increased diffuse interstitial opacities. No pleural effusion or pneumothorax. Similar mildly enlarged cardiomediastinal silhouette. No acute osseous abnormality. IMPRESSION: Increased pulmonary edema. Electronically Signed   By: Limin  Xu M.D.   On: 04/14/2024 12:07   NM Pulmonary Perfusion Result Date: 04/13/2024 CLINICAL DATA:  Pulmonary embolism (PE) suspected, high prob EXAM: NUCLEAR MEDICINE PERFUSION LUNG SCAN TECHNIQUE: Perfusion images were obtained in multiple projections after intravenous injection of radiopharmaceutical. No ventilation imaging performed. RADIOPHARMACEUTICALS:  4.38 mCi Tc-46m MAA IV COMPARISON:  Noncontrast chest CT 04/11/2024. Chest radiographs 04/11/2024. FINDINGS: There are no wedge-shaped perfusion defects to suggest acute pulmonary embolism. Pulmonary perfusion within normal limits. IMPRESSION: No evidence of acute pulmonary embolism on perfusion scintigraphy by PISAPED criteria. Electronically Signed   By: Elsie Perone M.D.   On: 04/13/2024 14:36   US  EKG SITE RITE Result Date: 04/12/2024 If Site Rite image not attached, placement could not be confirmed due to current cardiac rhythm.  CT CHEST ABDOMEN PELVIS WO CONTRAST Result Date: 04/11/2024 CLINICAL DATA:  Sepsis EXAM: CT CHEST, ABDOMEN AND PELVIS WITHOUT CONTRAST TECHNIQUE: Multidetector CT imaging of the chest, abdomen and pelvis was performed following the standard protocol without IV contrast. RADIATION DOSE REDUCTION: This exam was performed according to the departmental dose-optimization program which includes automated exposure control, adjustment of the mA and/or kV according to patient size and/or use of iterative reconstruction technique. COMPARISON:  Chest x-ray from earlier in the same day, CT from 03/28/2024. FINDINGS: CT CHEST FINDINGS Cardiovascular: Somewhat limited due to lack of IV contrast. Thoracic aorta shows  no aneurysmal dilatation. Mild atherosclerotic calcifications are seen. Pulmonary artery is not significantly enlarged. The heart is within normal limits. No significant coronary calcifications are noted. Mediastinum/Nodes: Thoracic inlet is within normal limits. No hilar or mediastinal adenopathy is noted. The esophagus as visualized is within normal limits. Lungs/Pleura: Mild bibasilar atelectatic changes are seen. No sizable effusion is noted. No parenchymal nodules are seen. Musculoskeletal: No chest wall mass or suspicious bone lesions identified. CT ABDOMEN PELVIS FINDINGS Hepatobiliary: Liver and gallbladder are within normal limits. Pancreas: Unremarkable. No pancreatic ductal dilatation or surrounding inflammatory changes. Spleen: Normal in size without focal abnormality. Adrenals/Urinary Tract: Adrenal glands are within normal limits. Kidneys demonstrate bilateral perinephric stranding similar to that seen on the prior exam. Simple renal cyst is noted centrally in the left kidney. No follow-up is recommended. Scattered nonobstructing stones are noted on the right the largest of which measures 10 mm. The ureters are within normal limits. No obstructive changes are seen. Hyperdense cyst is again noted in the right kidney inferiorly. No follow-up is recommended. The bladder is partially distended. Stomach/Bowel: No obstructive or inflammatory changes of the colon are noted. The appendix is not visualized consistent with a prior surgical history. Stomach and small bowel are within normal limits. Vascular/Lymphatic: Aortic atherosclerosis. No enlarged abdominal or pelvic lymph nodes. Reproductive: Prostate is unremarkable. Other: Small fat containing umbilical hernia is noted. No abdominopelvic ascites. Musculoskeletal: Postsurgical changes are noted in the lower lumbar spine. IMPRESSION: CT of the chest: Bibasilar atelectasis without sizable effusion. CT of the abdomen and pelvis: Nonobstructing right renal  calculi similar to that seen on the prior exam. No obstructive changes are seen. Electronically Signed   By: Oneil Devonshire M.D.   On: 04/11/2024 20:04   DG Chest Port 1 View Result Date: 04/11/2024 CLINICAL DATA:  Nausea, vomiting  and diarrhea 3 days. Fell today. Questionable sepsis. EXAM: PORTABLE CHEST 1 VIEW COMPARISON:  Chest x-ray 03/24/2024 FINDINGS: The cardiac silhouette, mediastinal and hilar contours are within normal limits and stable given the AP projection, portable technique and low lung volumes. Low lung volumes with vascular crowding and bibasilar atelectasis. No definite infiltrates, effusions or pneumothorax. IMPRESSION: Low lung volumes with vascular crowding and bibasilar atelectasis. Electronically Signed   By: MYRTIS Stammer M.D.   On: 04/11/2024 18:26   CT Renal Stone Study Result Date: 03/28/2024 CLINICAL DATA:  Abdominal/flank pain, stone suspected. EXAM: CT ABDOMEN AND PELVIS WITHOUT CONTRAST TECHNIQUE: Multidetector CT imaging of the abdomen and pelvis was performed following the standard protocol without IV contrast. RADIATION DOSE REDUCTION: This exam was performed according to the departmental dose-optimization program which includes automated exposure control, adjustment of the mA and/or kV according to patient size and/or use of iterative reconstruction technique. COMPARISON:  03/20/2024 and 03/15/2024 FINDINGS: Lower chest: Again noted are streaky densities at the lung bases suggestive for scarring and/or atelectasis. No pleural effusions. Hepatobiliary: Normal appearance of the liver and gallbladder. Pancreas: Unremarkable. No pancreatic ductal dilatation or surrounding inflammatory changes. Spleen: Normal in size without focal abnormality. Adrenals/Urinary Tract: Normal adrenal glands. Chronic perinephric stranding or edema. Negative for hydronephrosis. Normal urinary bladder. Multiple right kidney stones, largest measures 1.1 cm in lower pole. Again noted is a left renal sinus  cyst. Small hyperdense exophytic structure in the right kidney lower pole measures 1.1 cm and minimally changed since 2024. This is likely a hyperdense cyst. These renal cysts do not require dedicated follow-up. Tiny stone in left kidney lower pole. Stomach/Bowel: Moderate stool burden. No bowel dilatation or obstruction. No focal bowel inflammation. Normal appearance of the stomach. Vascular/Lymphatic: Atherosclerotic calcifications in the abdominal aorta without aneurysm. No significant lymph node enlargement in the abdomen or pelvis. Reproductive: Prostate is unremarkable. Other: Negative for free fluid. Negative for free air. Small umbilical hernia containing fat. Musculoskeletal: Interbody fusion at L3-L4 and L4-L5. Disc space narrowing at L5-S1. No acute bone abnormality. IMPRESSION: 1. No acute abnormality in the abdomen or pelvis. 2. Nonobstructive bilateral renal calculi. Largest stone measures 1.1 cm in the right kidney lower pole. 3. Moderate stool burden. 4. Umbilical hernia. 5. Aortic Atherosclerosis (ICD10-I70.0). Electronically Signed   By: Juliene Balder M.D.   On: 03/28/2024 09:45   DG CHEST PORT 1 VIEW Result Date: 03/24/2024 CLINICAL DATA:  Cough. EXAM: PORTABLE CHEST 1 VIEW COMPARISON:  Chest radiograph dated 03/02/2024. FINDINGS: No focal consolidation, pleural effusion or pneumothorax. The cardiac silhouette is within normal limits. No acute osseous pathology. IMPRESSION: No active disease. Electronically Signed   By: Vanetta Chou M.D.   On: 03/24/2024 14:03    Reassessment and Plan:   After reassess the patient, once placed on room air he did desaturate to approximately 86-87% on room air.  Oxygen saturations rebounded after he was placed on oxygen by nasal cannula at 2 L/min.  As such, will consult with hospitalist team for admission to the hospital for new oxygen requirement, also pending imaging of the right upper quadrant of the abdomen to assess for elevated LFTs.  Secondary to  his hypoxia on room air at rest, consulted with hospitalist team who accepts patient for admission at this time.  Results of abdominal ultrasound did not show any acute hepatobiliary disorders.       Final diagnoses:  Hypoxia  Other chest pain    ED Discharge Orders     None  Myriam Dorn BROCKS, PA 04/20/24 1445    Myriam Dorn BROCKS, PA 04/20/24 1445    Yolande Lamar BROCKS, MD 04/25/24 2113

## 2024-04-20 NOTE — ED Triage Notes (Signed)
 Pt BIB EMS from [facility] for chest pain, states its more like pressure on his chest, and SOB for several days. Facility said room air 78%. Given 3 nitroglycerin  at the facility. EMS gave 1 nitroglycerin , 324 ASA, 5 albulterol. Not normally on oxygen. Hx of pneumonia. Aox4.

## 2024-04-21 ENCOUNTER — Inpatient Hospital Stay (HOSPITAL_COMMUNITY)

## 2024-04-21 DIAGNOSIS — J9601 Acute respiratory failure with hypoxia: Secondary | ICD-10-CM | POA: Diagnosis not present

## 2024-04-21 LAB — GLUCOSE, CAPILLARY
Glucose-Capillary: 281 mg/dL — ABNORMAL HIGH (ref 70–99)
Glucose-Capillary: 313 mg/dL — ABNORMAL HIGH (ref 70–99)
Glucose-Capillary: 304 mg/dL — ABNORMAL HIGH (ref 70–99)
Glucose-Capillary: 238 mg/dL — ABNORMAL HIGH (ref 70–99)

## 2024-04-21 MED ORDER — POLYETHYLENE GLYCOL 3350 17 G PO PACK
17.0000 g | PACK | Freq: Every day | ORAL | Status: DC
  Administered 2024-04-21 – 2024-04-26 (×3): 17 g via ORAL
  Filled 2024-04-21 (×7): qty 1

## 2024-04-21 MED ORDER — SODIUM CHLORIDE 0.9 % IV SOLN
1.5000 g | Freq: Four times a day (QID) | INTRAVENOUS | Status: DC
  Administered 2024-04-21 – 2024-04-25 (×16): 1.5 g via INTRAVENOUS
  Filled 2024-04-21 (×21): qty 4

## 2024-04-21 MED ORDER — HYDROMORPHONE HCL 1 MG/ML IJ SOLN: 1.0000 mg | INTRAMUSCULAR | Status: DC | PRN

## 2024-04-21 MED ORDER — INSULIN ASPART 100 UNIT/ML IJ SOLN
2.0000 [IU] | Freq: Three times a day (TID) | INTRAMUSCULAR | Status: DC
  Administered 2024-04-21 – 2024-04-27 (×18): 2 [IU] via SUBCUTANEOUS

## 2024-04-21 MED ORDER — HYDROMORPHONE HCL 1 MG/ML IJ SOLN
1.0000 mg | INTRAMUSCULAR | Status: AC | PRN
  Administered 2024-04-21 – 2024-04-22 (×4): 1 mg via INTRAVENOUS
  Filled 2024-04-21 (×4): qty 1

## 2024-04-21 MED ORDER — OXYCODONE HCL 5 MG PO TABS
5.0000 mg | ORAL_TABLET | Freq: Four times a day (QID) | ORAL | Status: DC | PRN
  Administered 2024-04-21 – 2024-04-27 (×19): 10 mg via ORAL
  Filled 2024-04-21 (×20): qty 2

## 2024-04-21 MED ORDER — IPRATROPIUM-ALBUTEROL 0.5-2.5 (3) MG/3ML IN SOLN: 3.0000 mL | Freq: Four times a day (QID) | RESPIRATORY_TRACT | Status: DC | PRN

## 2024-04-21 MED ORDER — INSULIN GLARGINE-YFGN 100 UNIT/ML ~~LOC~~ SOLN
15.0000 [IU] | Freq: Every day | SUBCUTANEOUS | Status: DC
  Administered 2024-04-21 – 2024-04-24 (×4): 15 [IU] via SUBCUTANEOUS
  Filled 2024-04-21 (×6): qty 0.15

## 2024-04-21 MED ORDER — SENNOSIDES-DOCUSATE SODIUM 8.6-50 MG PO TABS
1.0000 | ORAL_TABLET | Freq: Two times a day (BID) | ORAL | Status: DC
  Administered 2024-04-21 – 2024-04-26 (×9): 1 via ORAL
  Filled 2024-04-21 (×14): qty 1

## 2024-04-21 NOTE — Progress Notes (Signed)
 PROGRESS NOTE  Brett White FMW:[id number] DOB: Jun 25, 1961 DOA: 04/20/2024 PCP: [facility]  HPI/Recap of past 24 hours: Brett White is a 63 y.o. 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  Patient is from [facility] facility stated patient was around 78% on room air.  Patient he is not on oxygen. In the ED, pt tachycardic. Labs notable for glucose 368. CXR w/o focal consolidations. CT chest w/ bilateral pleural effusions.  Of note, patient has multiple frequent hospitalization and concern for significant drug-seeking behavior.  Patient admitted for further management.    Today, patient reports that his pain is all over his body, denies any specific chest pain or abdominal pain.  Laying in bed, appearing comfortable.  Currently on 2 L of oxygen, saturating well.  Overnight, patient requesting for round-the-clock IV Dilaudid .    Assessment/Plan: Principal Problem:   Acute hypoxemic respiratory failure (HCC)  Chest pain Denies any current left-sided chest pain Troponins negative EKG with sinus tachycardia, no acute ST changes Last echo done on 04/15/24, showed EF of 55 to 60%, no regional wall motion abnormality, left ventricular diastolic parameters are indeterminate Telemetry  Acute hypoxic respiratory failure Possible aspiration event/pneumonia Unlikely acute HFpEF exacerbation Oxygen sats dropped to 78% on room air with recovery, possibly an aspiration event (if accurate) Currently afebrile, with no leukocytosis BNP 28, trended downwards from 163  CXR w/o focal consolidations CT chest with b/l tiny pleural effusions, pulmonary artery hypertension, noted dilated esophagus with food/debris's within, suggest possibly esophageal dysmotility/GERD Discontinue IV Lasix  for now Will order esophagram, pending SLP evaluation Cover with IV Unasyn , may switch to p.o. Augmentin  to complete 5  days Continue DuoNebs Supplemental O2 as needed, plan to wean off  Transaminitis Elevated AST, ALT, alkaline phosphatase and T. 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, poorly controlled Last A1c 9.1 SSI, glargine, Accu-Cheks, hypoglycemic protocol On Farxiga , glipizide , Trulicity    Chronic pain PTA lyrica  100mg  BID, reduced dose Dilaudid  2mg  q6h (4mg  q6h pta), and duloxetine  60mg  daily Requesting for IV Dilaudid , will continue for 1 more day, switch back to home regimen at a reduced dose (??may have been pretty drowsy or possibly aspirated)   HTN PTA amlodipine  10mg  daily, hold losartan  for now   GERD PTA PPI  Obesity class 1 Lifestyle modification advised    Estimated body mass index is 33.26 kg/m as calculated from the following:   Height as of this encounter: 6' 2 (1.88 m).   Weight as of this encounter: 117.5 kg.     Code Status: Full  Family Communication: None at bedside  Disposition Plan: Status is: Inpatient Remains inpatient appropriate because: Level of care      Consultants: None  Procedures: None  Antimicrobials: Unasyn   DVT prophylaxis: Lovenox    Objective: Vitals:   04/21/24 0636 04/21/24 0819 04/21/24 1000 04/21/24 1030  BP:  138/75    Pulse: 89   88  Resp:  16  18  Temp:  97.9 F (36.6 C)    TempSrc:  Oral    SpO2: 94% 91% 96% 93%  Weight:      Height:        Intake/Output Summary (Last 24 hours) at 04/21/2024 1041 Last data filed at 04/21/2024 1000 Gross per 24 hour  Intake 720 ml  Output 2450 ml  Net -1730 ml   Filed Weights   04/20/24 0917 04/20/24 1819  Weight: 127.2 kg 117.5 kg    Exam: General: NAD  Cardiovascular: S1, S2 present Respiratory: CTAB Abdomen: Soft, nontender, nondistended, bowel sounds present Musculoskeletal: No pedal edema noted, left-sided BKA Skin:  Normal Psychiatry: Normal mood    Data Reviewed: CBC: Recent Labs  Lab 04/15/24 0447 04/17/24 0352 04/20/24 0920  WBC 7.4 5.9 7.1  NEUTROABS 5.7  --   --   HGB 8.2* 8.8* 11.5*  HCT 27.2* 29.5* 37.3*  MCV 80.7 81.7 78.9*  PLT 211 339 542*   Basic Metabolic Panel: Recent Labs  Lab 04/15/24 0447 04/16/24 0500 04/17/24 0352 04/18/24 0324 04/19/24 0326 04/20/24 0920  NA 132* 137 137 138  --  135  K 4.2 4.7 5.0 4.8  --  4.9  CL 98 98 97* 98  --  95*  CO2 27 30 31 31   --  27  GLUCOSE 213* 276* 213* 196*  --  280*  BUN 23* 30* 33* 27*  --  22*  CREATININE 1.42* 1.48* 1.20 0.96  --  1.13  CALCIUM  8.0* 8.5* 8.6* 8.6*  --  9.3  MG 2.0 2.1 1.9 1.7 1.9  --    GFR: Estimated Creatinine Clearance: 94.7 mL/min (by C-G formula based on SCr of 1.13 mg/dL). Liver Function Tests: Recent Labs  Lab 04/15/24 0447 04/20/24 0920  AST 27 134*  ALT 25 128*  ALKPHOS 96 125  BILITOT 0.5 0.5  PROT 5.9* 7.4  ALBUMIN  2.3* 3.0*   No results for input(s): LIPASE, AMYLASE in the last 168 hours. No results for input(s): AMMONIA in the last 168 hours. Coagulation Profile: No results for input(s): INR, PROTIME in the last 168 hours. Cardiac Enzymes: No results for input(s): CKTOTAL, CKMB, CKMBINDEX, TROPONINI in the last 168 hours. BNP (last 3 results) No results for input(s): PROBNP in the last 8760 hours. HbA1C: Recent Labs    04/20/24 1835  HGBA1C 9.1*   CBG: Recent Labs  Lab 04/19/24 1149 04/20/24 0917 04/20/24 1721 04/20/24 2122 04/21/24 0832  GLUCAP 368* 284* 302* 316* 281*   Lipid Profile: No results for input(s): CHOL, HDL, LDLCALC, TRIG, CHOLHDL, LDLDIRECT in the last 72 hours. Thyroid Function Tests: No results for input(s): TSH, T4TOTAL, FREET4, T3FREE, THYROIDAB in the last 72 hours. Anemia Panel: No results for input(s): VITAMINB12, FOLATE, FERRITIN, TIBC, IRON, RETICCTPCT in the last 72 hours. Urine  analysis:    Component Value Date/Time   COLORURINE YELLOW 04/20/2024 1054   APPEARANCEUR CLEAR 04/20/2024 1054   LABSPEC 1.012 04/20/2024 1054   PHURINE 8.0 04/20/2024 1054   GLUCOSEU 50 (A) 04/20/2024 1054   HGBUR NEGATIVE 04/20/2024 1054   BILIRUBINUR NEGATIVE 04/20/2024 1054   KETONESUR NEGATIVE 04/20/2024 1054   PROTEINUR 30 (A) 04/20/2024 1054   NITRITE NEGATIVE 04/20/2024 1054   LEUKOCYTESUR NEGATIVE 04/20/2024 1054   Sepsis Labs: @LABRCNTIP (procalcitonin:4,lacticidven:4)  ) Recent Results (from the past 240 hours)  Urine Culture     Status: Abnormal   Collection Time: 04/11/24  3:31 PM   Specimen: Urine, Random  Result Value Ref Range Status   Specimen Description   Final    URINE, RANDOM Performed at [facility], [address], [place], [place] [address]    Special Requests   Final    NONE Reflexed from [phone] Performed at [facility], [address], [place], [place] [address]    Culture (A)  Final  80,000 COLONIES/mL ESCHERICHIA COLI Confirmed Extended Spectrum Beta-Lactamase Producer (ESBL).  In bloodstream infections from ESBL organisms, carbapenems are preferred over piperacillin /tazobactam. They are shown to have a lower risk of mortality.    Report Status 04/14/2024 FINAL  Final   Organism ID, Bacteria ESCHERICHIA COLI (A)  Final      Susceptibility   Escherichia coli - MIC*    AMPICILLIN  >=32 RESISTANT Resistant     CEFAZOLIN  >=64 RESISTANT Resistant     CEFEPIME  >=32 RESISTANT Resistant     CEFTRIAXONE  >=64 RESISTANT Resistant     CIPROFLOXACIN >=4 RESISTANT Resistant     GENTAMICIN  <=1 SENSITIVE Sensitive     IMIPENEM <=0.25 SENSITIVE Sensitive     NITROFURANTOIN <=16 SENSITIVE Sensitive     TRIMETH /SULFA  >=320 RESISTANT Resistant     AMPICILLIN /SULBACTAM >=32 RESISTANT Resistant     PIP/TAZO 8 SENSITIVE Sensitive ug/mL    * 80,000 COLONIES/mL ESCHERICHIA COLI  Blood Culture (routine x 2)     Status:  None   Collection Time: 04/11/24  5:40 PM   Specimen: BLOOD LEFT FOREARM  Result Value Ref Range Status   Specimen Description   Final    BLOOD LEFT FOREARM Performed at [facility], [address], [place], [place] [address]    Special Requests   Final    BOTTLES DRAWN AEROBIC AND ANAEROBIC Blood Culture results may not be optimal due to an inadequate volume of blood received in culture bottles Performed at [facility], [address] [place], [place] [address]    Culture   Final    NO GROWTH 5 DAYS Performed at [facility], [address] [place], [place] [address]    Report Status 04/16/2024 FINAL  Final  Blood Culture (routine x 2)     Status: None   Collection Time: 04/11/24  5:46 PM   Specimen: BLOOD LEFT HAND  Result Value Ref Range Status   Specimen Description BLOOD LEFT HAND  Final   Special Requests   Final    BOTTLES DRAWN AEROBIC AND ANAEROBIC Blood Culture adequate volume   Culture   Final    NO GROWTH 5 DAYS Performed at [facility], [address], [place], [place] [address]    Report Status 04/16/2024 FINAL  Final  Resp panel by RT-PCR (RSV, Flu A&B, Covid) Anterior Nasal Swab     Status: None   Collection Time: 04/11/24  6:50 PM   Specimen: Anterior Nasal Swab  Result Value Ref Range Status   SARS Coronavirus 2 by RT PCR NEGATIVE NEGATIVE Final    Comment: (NOTE) SARS-CoV-2 target nucleic acids are NOT DETECTED.  The SARS-CoV-2 RNA is generally detectable in upper respiratory specimens during the acute phase of infection. The lowest concentration of SARS-CoV-2 viral copies this assay can detect is 138 copies/mL. A negative result does not preclude SARS-Cov-2 infection and should not be used as the sole basis for treatment or other patient management decisions. A negative result may occur with  improper specimen collection/handling, submission of specimen other than nasopharyngeal swab, presence of viral  mutation(s) within the areas targeted by this assay, and inadequate number of viral copies(<138 copies/mL). A negative result must be combined with clinical observations, patient history, and epidemiological information. The expected result is Negative.  Fact Sheet for Patients:  BloggerCourse.com  Fact Sheet for Healthcare Providers:  SeriousBroker.it  This test is no t yet approved or cleared by the [place]  FDA and  has  been authorized for detection and/or diagnosis of SARS-CoV-2 by FDA under an Emergency Use Authorization (EUA). This EUA will remain  in effect (meaning this test can be used) for the duration of the COVID-19 declaration under Section 564(b)(1) of the Act, 21 U.S.C.section 360bbb-3(b)(1), unless the authorization is terminated  or revoked sooner.       Influenza A by PCR NEGATIVE NEGATIVE Final   Influenza B by PCR NEGATIVE NEGATIVE Final    Comment: (NOTE) The Xpert Xpress SARS-CoV-2/FLU/RSV plus assay is intended as an aid in the diagnosis of influenza from Nasopharyngeal swab specimens and should not be used as a sole basis for treatment. Nasal washings and aspirates are unacceptable for Xpert Xpress SARS-CoV-2/FLU/RSV testing.  Fact Sheet for Patients: BloggerCourse.com  Fact Sheet for Healthcare Providers: SeriousBroker.it  This test is not yet approved or cleared by the [place]  FDA and has been authorized for detection and/or diagnosis of SARS-CoV-2 by FDA under an Emergency Use Authorization (EUA). This EUA will remain in effect (meaning this test can be used) for the duration of the COVID-19 declaration under Section 564(b)(1) of the Act, 21 U.S.C. section 360bbb-3(b)(1), unless the authorization is terminated or revoked.     Resp Syncytial Virus by PCR NEGATIVE NEGATIVE Final    Comment: (NOTE) Fact Sheet for  Patients: BloggerCourse.com  Fact Sheet for Healthcare Providers: SeriousBroker.it  This test is not yet approved or cleared by the [place]  FDA and has been authorized for detection and/or diagnosis of SARS-CoV-2 by FDA under an Emergency Use Authorization (EUA). This EUA will remain in effect (meaning this test can be used) for the duration of the COVID-19 declaration under Section 564(b)(1) of the Act, 21 U.S.C. section 360bbb-3(b)(1), unless the authorization is terminated or revoked.  Performed at [facility], [address] [place], [place] [address]   MRSA Next Gen by PCR, Nasal     Status: Abnormal   Collection Time: 04/12/24 12:33 AM   Specimen: Nasal Mucosa; Nasal Swab  Result Value Ref Range Status   MRSA by PCR Next Gen DETECTED (A) NOT DETECTED Final    Comment: CRITICAL RESULT CALLED TO, READ BACK BY AND VERIFIED WITH: REXIE MANO RN AT 04/12/2024 0609 BY JEREMY C (NOTE) The GeneXpert MRSA Assay (FDA approved for NASAL specimens only), is one component of a comprehensive MRSA colonization surveillance program. It is not intended to diagnose MRSA infection nor to guide or monitor treatment for MRSA infections. Test performance is not FDA approved in patients less than 33 years old. Performed at [facility] [address] [place], [place] [address]       Studies: CT CHEST WO CONTRAST Result Date: 04/20/2024 EXAM: CT CHEST WITHOUT CONTRAST 04/20/2024 03:21:24 PM TECHNIQUE: CT of the chest was performed without the administration of intravenous contrast. Multiplanar reformatted images are provided for review. Automated exposure control, iterative reconstruction, and/or weight based adjustment of the mA/kV was utilized to reduce the radiation dose to as low as reasonably achievable. COMPARISON: Chest radiograph of earlier today. CLINICAL HISTORY: Dyspnea, chronic, chest wall  or pleura disease suspected. Pt BIB EMS from [facility] for chest pain, states its more like pressure on his chest, and SOB for several days. Facility said room air 78%. Given 3 nitroglycerin  at the facility. EMS gave 1 nitroglycerin , 324 ASA, 5 albulterol. Not normally on oxygen. Hx of pneumonia. Aox4. FINDINGS: MEDIASTINUM: Mild cardiomegaly. Pulmonary artery enlargement, outflow tract 3.2 cm. Moderate esophageal dilatation with food or debris within,  including on 75/3. LYMPH NODES: No thoracic adenopathy. LUNGS AND PLEURA: Minimal dependent subsegmental atelectasis in both lung bases. Tiny bilateral pleural effusions. SOFT TISSUES/BONES: Aortic atherosclerosis. LAD coronary artery calcification. UPPER ABDOMEN: Limited images of the upper abdomen demonstrates no acute abnormality. IMPRESSION: 1. No acute findings. 2. Tiny bilateral pleural effusions 3. Aortic and coronary artery atherosclerosis 4.  Pulmonary artery enlargement suggests pulmonary arterial hypertension. 5. Dilated esophagus with food/debris within. This suggests esophageal dysmotility and/or gastroesophageal reflux. Electronically signed by: Rockey Kilts MD 04/20/2024 03:35 PM EDT RP Workstation: [id number]   US  Abdomen Limited RUQ (LIVER/GB) Result Date: 04/20/2024 CLINICAL DATA:  Upper abdominal pain EXAM: ULTRASOUND ABDOMEN LIMITED RIGHT UPPER QUADRANT COMPARISON:  None Available. FINDINGS: Gallbladder: No gallstones or wall thickening visualized. No sonographic Murphy sign noted by sonographer. Common bile duct: Diameter: 0.3 cm Liver: No focal lesion identified. Within normal limits in parenchymal echogenicity. Portal vein is patent on color Doppler imaging with normal direction of blood flow towards the liver. Other: None. IMPRESSION: 1. Normal hepatobiliary ultrasound. Electronically Signed   By: Ryan Salvage M.D.   On: 04/20/2024 14:42    Scheduled Meds:  amLODipine   10 mg Oral q morning   artificial tears  1 drop Left Eye QID    atorvastatin   40 mg Oral QHS   brimonidine   1 drop Left Eye BID   DULoxetine   60 mg Oral q AM   enoxaparin  (LOVENOX ) injection  60 mg Subcutaneous Q24H   famotidine   20 mg Oral q AM   furosemide   40 mg Intravenous Q12H   insulin  aspart  0-15 Units Subcutaneous TID WC   insulin  aspart  2 Units Subcutaneous TID WC   insulin  glargine-yfgn  15 Units Subcutaneous QHS   ipratropium-albuterol   3 mL Nebulization TID   pantoprazole   40 mg Oral Daily   pregabalin   100 mg Oral BID   sertraline   50 mg Oral q AM   tamsulosin   0.4 mg Oral QPC supper   traZODone   75 mg Oral QHS    Continuous Infusions:   LOS: 1 day     Hudson Majkowski J Mehki Klumpp, MD [facility]  If 7PM-7AM, please contact night-coverage [email or web address] 04/21/2024, 10:41 AM

## 2024-04-21 NOTE — Inpatient Diabetes Management (Signed)
 Inpatient Diabetes Program Recommendations  AACE/ADA: New Consensus Statement on Inpatient Glycemic Control (2015)  Target Ranges:  Prepandial:   less than 140 mg/dL      Peak postprandial:   less than 180 mg/dL (1-2 hours)      Critically ill patients:  140 - 180 mg/dL   Lab Results  Component Value Date   GLUCAP 281 (H) 04/21/2024   HGBA1C 9.1 (H) 04/20/2024    Review of Glycemic Control  Latest Reference Range & Units 04/20/24 09:17 04/20/24 17:21 04/20/24 21:22 04/21/24 08:32  Glucose-Capillary 70 - 99 mg/dL 715 (H) 697 (H) 683 (H) 281 (H)   Diabetes type 2 Outpatient Diabetes medications:  Trulicity  0.5 mg weekly Glipizide  10 mg QAM Humalog 2-8 units TID Farxiga  10 mg every day   Current orders for Inpatient glycemic control:  Semglee  10 units qhs Novolog  0-15 units tid   Inpatient Diabetes Program Recommendations:   -   Increase Semglee  15 units -   Add Novolog  2 units tid meal coverage if eating >50% of meals   Note: just d/c'd on 7/20  Thanks,  Clotilda Bull RN, MSN, BC-ADM Inpatient Diabetes Coordinator Team Pager [phone] (8a-5p)

## 2024-04-21 NOTE — Progress Notes (Signed)
 Pharmacy Antibiotic Note  Brett White is a 62 y.o. male admitted on 04/20/2024 with aspiration pneumonia.  Pharmacy has been consulted for Unasyn  dosing.  Plan: Unasyn  will be started at 1.5g IV every 6 hours. Monitor patient WBC and temperature daily. Monitor for any signs of worsening infection.  Height: 6' 2 (188 cm) Weight: 117.5 kg (259 lb 0.7 oz) IBW/kg (Calculated) : 82.2  Temp (24hrs), Avg:98.2 F (36.8 C), Min:97.9 F (36.6 C), Max:98.6 F (37 C)  Recent Labs  Lab 04/15/24 0447 04/16/24 0500 04/17/24 0352 04/18/24 0324 04/20/24 0920  WBC 7.4  --  5.9  --  7.1  CREATININE 1.42* 1.48* 1.20 0.96 1.13    Estimated Creatinine Clearance: 94.7 mL/min (by C-G formula based on SCr of 1.13 mg/dL).    No Known Allergies  Antimicrobials this admission: Unasyn  1.5g 7/22 >>    Dose adjustments this admission: None  Microbiology results: None collected. Monitor improvement in clinical picture.  Thank you for allowing pharmacy to be involved with this patient's care.  Mendel Barter, PharmD PGY1 Clinical Pharmacist [facility]  04/21/2024 11:16 AM

## 2024-04-21 NOTE — Plan of Care (Signed)
   Problem: Fluid Volume: Goal: Ability to maintain a balanced intake and output will improve Outcome: Progressing   Problem: Health Behavior/Discharge Planning: Goal: Ability to identify and utilize available resources and services will improve Outcome: Progressing Goal: Ability to manage health-related needs will improve Outcome: Progressing   Problem: Metabolic: Goal: Ability to maintain appropriate glucose levels will improve Outcome: Progressing   Problem: Nutritional: Goal: Maintenance of adequate nutrition will improve Outcome: Progressing

## 2024-04-21 NOTE — Evaluation (Signed)
 Clinical/Bedside Swallow Evaluation Patient Details  Name: Brett White MRN: [id number] Date of Birth: March 09, 1961  Today's Date: 04/21/2024 Time: SLP Start Time (ACUTE ONLY): 1411 SLP Stop Time (ACUTE ONLY): 1430 SLP Time Calculation (min) (ACUTE ONLY): 19 min  Past Medical History:  Past Medical History:  Diagnosis Date   CAP (community acquired pneumonia) 12/09/2021   CKD (chronic kidney disease)    GERD (gastroesophageal reflux disease)    HTN (hypertension)    Insulin  dependent type 2 diabetes mellitus (HCC)    Neuropathy    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   Stroke [facility])    Past Surgical History:  Past Surgical History:  Procedure Laterality Date   ABDOMINAL AORTOGRAM W/LOWER EXTREMITY N/A 06/20/2023   Procedure: ABDOMINAL AORTOGRAM W/LOWER EXTREMITY;  Surgeon: Gretta Lonni PARAS, MD;  Location: [facility] INVASIVE CV LAB;  Service: Cardiovascular;  Laterality: N/A;   AMPUTATION Left 02/04/2021   Procedure: LEFT GREAT TOE AMPUTATION;  Surgeon: Harden Jerona GAILS, MD;  Location: [facility] OR;  Service: Orthopedics;  Laterality: Left;   AMPUTATION Left 02/10/2021   Procedure: LEFT FOOT 1ST RAY AMPUTATION;  Surgeon: Harden Jerona GAILS, MD;  Location: [facility] OR;  Service: Orthopedics;  Laterality: Left;   AMPUTATION Left 03/22/2021   Procedure: LEFT BELOW KNEE AMPUTATION;  Surgeon: Harden Jerona GAILS, MD;  Location: [facility] OR;  Service: Orthopedics;  Laterality: Left;   APPENDECTOMY     BACK SURGERY     CATARACT EXTRACTION     I & D EXTREMITY Left 03/03/2021   Procedure: LEFT FOOT DEBRIDEMENT;  Surgeon: Harden Jerona GAILS, MD;  Location: [facility] OR;  Service: Orthopedics;  Laterality: Left;   INCISION AND DRAINAGE ABSCESS N/A 10/06/2021   Procedure: INCISION AND DRAINAGE BACK ABSCESS;  Surgeon: Signe Mitzie LABOR, MD;  Location: [facility] OR;  Service: General;  Laterality: N/A;   INJECTION OF SILICONE OIL Left 07/25/2023   Procedure: INJECTION OF SILICONE OIL;  Surgeon: Valdemar Rogue, MD;  Location:  [facility] OR;  Service: Ophthalmology;  Laterality: Left;   MEMBRANE PEEL Left 07/25/2023   Procedure: MEMBRANE PEEL;  Surgeon: Valdemar Rogue, MD;  Location: [facility] OR;  Service: Ophthalmology;  Laterality: Left;   PARS PLANA VITRECTOMY Left 07/25/2023   Procedure: PARS PLANA VITRECTOMY WITH 25 GAUGE;  Surgeon: Valdemar Rogue, MD;  Location: [facility] OR;  Service: Ophthalmology;  Laterality: Left;   removal of back cyst     TONSILLECTOMY     HPI:  Brett White is a 63 y.o. male who presented to ED from [facility] SNF with dx of aspiration pna, acute hypoxic respiratory failure. CT chest b/l tiny pleural effusions, pulmonary artery hypertension, noted dilated esophagus with food/debris, suggesting possibly esophageal dysmotility/GERD. Dx with aspiration pna. PMHx DM2, HTN, and chronic pain. Multiple frequent hospitalizations and concern for significant drug-seeking behavior. MBS Feb 2025 revealed functional swallowing, no oropharyngeal dysphagia.  Fluids in distal esophagus noted in imaging during that admission. He endorses feeling like cement is stuck in his chest when eating. He avoids hard breads, crackers, tough meats. He elevates his HOB at night and states he alternates solids and liquids. He reports occasional regurgitation of meals.    Assessment / Plan / Recommendation  Clinical Impression  Pt presents with a likely primary esophageal dysphagia based on symptoms and absence of oropharyngeal findings.  Review of imaging from today's esophagram reveals barium residue and reduced clearance (radiology report is pending). There are no clinical suggestions of aspiration occurring during the swallow. Mastication and oral control  are normal. No coughing during assessment. If he is aspirating, it is likely post-prandial. Impact of esophageal dysmotility can be very uncomfortable - we discussed basic strategies, which he has been trying to implement.  There is no need for further SLP f/u.  Hopefully GI can offer some  help.  Our service will sign off.    Pt given handout re: esophageal precautions   Aspiration Risk  No limitations    Diet Recommendation   Thin;Age appropriate regular  Medication Administration: Whole meds with liquid    Other  Recommendations Recommended Consults: Consider GI evaluation Oral Care Recommendations: Oral care BID                         Prognosis Prognosis for improved oropharyngeal function: Good      Swallow Study   General Date of Onset: 04/20/24 HPI: Brett White is a 63 y.o. male who presented to ED from [facility] SNF with dx of aspiration pna, acute hypoxic respiratory failure. CT chest b/l tiny pleural effusions, pulmonary artery hypertension, noted dilated esophagus with food/debris, suggesting possibly esophageal dysmotility/GERD. Dx with aspiration pna. PMHx DM2, HTN, and chronic pain. Multiple frequent hospitalizations and concern for significant drug-seeking behavior. MBS Feb 2025 revealed functional swallowing, no oropharyngeal dysphagia.  Fluids in distal esophagus noted in imaging during that admission. He endorses feeling like cement is stuck in his chest when eating. He avoids hard breads, crackers, tough meats. He elevates his HOB at night and states he alternates solids and liquids. He reports occasional regurgitation of meals. Type of Study: Bedside Swallow Evaluation Previous Swallow Assessment: see HPI Diet Prior to this Study: Regular;Thin liquids (Level 0) Temperature Spikes Noted: No Respiratory Status: Nasal cannula History of Recent Intubation: No Behavior/Cognition: Alert;Cooperative Oral Cavity Assessment: Within Functional Limits Oral Care Completed by SLP: No Oral Cavity - Dentition: Missing dentition Vision: Functional for self-feeding Self-Feeding Abilities: Able to feed self Patient Positioning: Upright in bed Baseline Vocal Quality: Normal Volitional Cough: Strong Volitional Swallow: Able to elicit     Oral/Motor/Sensory Function Overall Oral Motor/Sensory Function: Within functional limits   Ice Chips Ice chips: Within functional limits   Thin Liquid Thin Liquid: Within functional limits    Nectar Thick Nectar Thick Liquid: Not tested   Honey Thick Honey Thick Liquid: Not tested   Puree Puree: Within functional limits   Solid     Solid:  (declined crackers due to difficulty swallowing)      Mandi Mattioli, Alan Laurice 04/21/2024,2:35 PM  Bernadene Garside L. Vona, MA CCC/SLP Clinical Specialist - Acute Care SLP Acute Rehabilitation Services Office number [phone]

## 2024-04-21 NOTE — TOC Initial Note (Signed)
 Transition of Care [facility]) - Initial/Assessment Note    Patient Details  Name: Brett White MRN: [id number] Date of Birth: Jan 10, 1961  Transition [facility]) CM/SW Contact:    Inocente GORMAN Kindle, LCSW Phone Number: 04/21/2024, 2:07 PM  Clinical Narrative:                 Patient admitted from [facility] under long term care. CSW will continue to follow for medical stability.   Expected Discharge Plan: Long Term Nursing Home Barriers to Discharge: Continued Medical Work up   Patient Goals and CMS Choice Patient states their goals for this hospitalization and ongoing recovery are:: Feel better          Expected Discharge Plan and Services In-house Referral: Clinical Social Work   Post Acute Care Choice: Nursing Home Living arrangements for the past 2 months: Skilled Nursing Facility                                      Prior Living Arrangements/Services Living arrangements for the past 2 months: Skilled Nursing Facility Lives with:: Facility Resident Patient language and need for interpreter reviewed:: Yes Do you feel safe going back to the place where you live?: Yes      Need for Family Participation in Patient Care: No (Comment) Care giver support system in place?: Yes (comment)   Criminal Activity/Legal Involvement Pertinent to Current Situation/Hospitalization: No - Comment as needed  Activities of Daily Living      Permission Sought/Granted Permission sought to share information with : Facility Medical sales representative, Family Supports Permission granted to share information with : Yes, Verbal Permission Granted  Share Information with NAME: Monteith,Michelle (Relative) [phone]  Permission granted to share info w AGENCY: [facility]        Emotional Assessment Appearance:: Appears stated age     Orientation: : Oriented to Self, Oriented to Place, Oriented to  Time, Oriented to Situation Alcohol  / Substance Use: Not Applicable Psych Involvement: No  (comment)  Admission diagnosis:  Other chest pain [R07.89] Hypoxia [R09.02] Acute hypoxemic respiratory failure (HCC) [J96.01] Patient Active Problem List   Diagnosis Date Noted   Acute hypoxemic respiratory failure (HCC) 04/20/2024   Acute renal failure superimposed on stage 2 chronic kidney disease (HCC) 04/11/2024   SIRS (systemic inflammatory response syndrome) (HCC) 04/11/2024   Acute cystitis 03/20/2024   Hyperkalemia 03/20/2024   Generalized anxiety disorder 03/20/2024   Chronic pain syndrome 03/20/2024   History of abdominal aortic aneurysm (AAA) 03/20/2024   HLD (hyperlipidemia) 03/20/2024   Narcotic dependence (HCC) 03/20/2024   Peripheral neuropathy 03/20/2024   Reactive airway disease 03/20/2024   Insomnia 03/20/2024   Unspecified open wound, right foot, initial encounter 05/14/2023   Pneumonia 11/16/2022   Acute respiratory failure with hypoxia (HCC) 11/16/2022   Sepsis with acute organ dysfunction (HCC) 11/16/2022   Aspiration pneumonia (HCC) 11/16/2022   GERD without esophagitis 06/11/2022   Mixed diabetic hyperlipidemia associated with type 2 diabetes mellitus (HCC) 06/11/2022   Adjustment disorder with depressed mood 05/23/2022   Chronic hyponatremia 12/09/2021   Uncontrolled type 2 diabetes mellitus with hyperglycemia, without long-term current use of insulin  (HCC) 12/09/2021   Tachycardia 12/09/2021   Acute kidney injury superimposed on chronic kidney disease (HCC) 10/04/2021   S/P BKA (below knee amputation) unilateral, left (HCC) 04/05/2021   Insulin  dependent type 2 diabetes mellitus (HCC) 02/03/2021   Essential hypertension 02/03/2021   Diabetic polyneuropathy  associated with type 2 diabetes mellitus (HCC) 02/03/2021   PCP:  N[facility]:   [id number] [place], [place] - [address] AT [facility] OF [address] [place] [address] Phone: [phone] Fax:  [phone]  [facility] - [place], [place] - [address] [place] [address] Phone: [phone] Fax: [phone]     Social Drivers of Health (SDOH) Social History: SDOH Screenings   Food Insecurity: No Food Insecurity (04/20/2024)  Housing: Low Risk  (04/20/2024)  Transportation Needs: No Transportation Needs (04/20/2024)  Utilities: Not At Risk (04/20/2024)  Tobacco Use: Low Risk  (04/11/2024)   SDOH Interventions:     Readmission Risk Interventions    04/21/2024    2:05 PM 04/13/2024    3:04 PM 11/16/2022    2:57 PM  Readmission Risk Prevention Plan  Transportation Screening Complete Complete Complete  Medication Review Oceanographer) Complete Complete Complete  PCP or Specialist appointment within 3-5 days of discharge Complete Complete Complete  HRI or Home Care Consult Complete Complete Complete  SW Recovery Care/Counseling Consult Complete Complete Complete  Palliative Care Screening Not Applicable Complete Complete  Skilled Nursing Facility Complete Complete Complete

## 2024-04-22 ENCOUNTER — Other Ambulatory Visit: Payer: Self-pay

## 2024-04-22 DIAGNOSIS — J690 Pneumonitis due to inhalation of food and vomit: Secondary | ICD-10-CM | POA: Diagnosis not present

## 2024-04-22 DIAGNOSIS — K224 Dyskinesia of esophagus: Secondary | ICD-10-CM

## 2024-04-22 DIAGNOSIS — J9601 Acute respiratory failure with hypoxia: Secondary | ICD-10-CM | POA: Diagnosis not present

## 2024-04-22 LAB — COMPREHENSIVE METABOLIC PANEL WITH GFR
ALT: 90 U/L — ABNORMAL HIGH (ref 0–44)
AST: 56 U/L — ABNORMAL HIGH (ref 15–41)
Albumin: 2.7 g/dL — ABNORMAL LOW (ref 3.5–5.0)
Alkaline Phosphatase: 109 U/L (ref 38–126)
Anion gap: 9 (ref 5–15)
BUN: 36 mg/dL — ABNORMAL HIGH (ref 6–20)
CO2: 30 mmol/L (ref 22–32)
Calcium: 8.9 mg/dL (ref 8.9–10.3)
Chloride: 95 mmol/L — ABNORMAL LOW (ref 98–111)
Creatinine, Ser: 1.38 mg/dL — ABNORMAL HIGH (ref 0.61–1.24)
GFR, Estimated: 59 mL/min — ABNORMAL LOW (ref >60)
Glucose, Bld: 329 mg/dL — ABNORMAL HIGH (ref 70–99)
Potassium: 4.7 mmol/L (ref 3.5–5.1)
Sodium: 134 mmol/L — ABNORMAL LOW (ref 135–145)
Total Bilirubin: 0.7 mg/dL (ref 0.0–1.2)
Total Protein: 6.7 g/dL (ref 6.5–8.1)

## 2024-04-22 LAB — CBC WITH DIFFERENTIAL/PLATELET
Abs Immature Granulocytes: 0.12 K/uL — ABNORMAL HIGH (ref 0.00–0.07)
Basophils Absolute: 0 K/uL (ref 0.0–0.1)
Basophils Relative: 1 %
Eosinophils Absolute: 0.2 K/uL (ref 0.0–0.5)
Eosinophils Relative: 4 %
HCT: 34 % — ABNORMAL LOW (ref 39.0–52.0)
Hemoglobin: 10.4 g/dL — ABNORMAL LOW (ref 13.0–17.0)
Immature Granulocytes: 2 %
Lymphocytes Relative: 24 %
Lymphs Abs: 1.6 K/uL (ref 0.7–4.0)
MCH: 24.4 pg — ABNORMAL LOW (ref 26.0–34.0)
MCHC: 30.6 g/dL (ref 30.0–36.0)
MCV: 79.8 fL — ABNORMAL LOW (ref 80.0–100.0)
Monocytes Absolute: 0.5 K/uL (ref 0.1–1.0)
Monocytes Relative: 7 %
Neutro Abs: 4.2 K/uL (ref 1.7–7.7)
Neutrophils Relative %: 62 %
Platelets: 509 K/uL — ABNORMAL HIGH (ref 150–400)
RBC: 4.26 MIL/uL (ref 4.22–5.81)
RDW: 18.6 % — ABNORMAL HIGH (ref 11.5–15.5)
WBC: 6.7 K/uL (ref 4.0–10.5)
nRBC: 0 % (ref 0.0–0.2)

## 2024-04-22 LAB — PROCALCITONIN: Procalcitonin: <0.1 ng/mL

## 2024-04-22 LAB — HEPATITIS PANEL, ACUTE
HCV Ab: NONREACTIVE
Hep A IgM: NONREACTIVE
Hep B C IgM: NONREACTIVE
Hepatitis B Surface Ag: NONREACTIVE

## 2024-04-22 LAB — GLUCOSE, CAPILLARY
Glucose-Capillary: 338 mg/dL — ABNORMAL HIGH (ref 70–99)
Glucose-Capillary: 238 mg/dL — ABNORMAL HIGH (ref 70–99)
Glucose-Capillary: 129 mg/dL — ABNORMAL HIGH (ref 70–99)
Glucose-Capillary: 261 mg/dL — ABNORMAL HIGH (ref 70–99)

## 2024-04-22 NOTE — TOC Progression Note (Signed)
 Transition of Care [facility]) - Progression Note    Patient Details  Name: Brett White MRN: [id number] Date of Birth: 14-Mar-1961  [facility]) CM/SW Contact  Isaiah Public, LCSWA Phone Number: 04/22/2024, 1:20 PM  Clinical Narrative:     CSW spoke with patient who informed CSW PTA he comes from [place] [facility]. Patient confirmed his plan is to return back to [facility] when medically stable for dc. CSW will continue to follow and assist with patients dc planning needs.  Expected Discharge Plan: Long Term Nursing Home Barriers to Discharge: Continued Medical Work up               Expected Discharge Plan and Services In-house Referral: Clinical Social Work   Post Acute Care Choice: Nursing Home Living arrangements for the past 2 months: Skilled Nursing Facility                                       Social Drivers of Health (SDOH) Interventions SDOH Screenings   Food Insecurity: No Food Insecurity (04/20/2024)  Housing: Low Risk  (04/20/2024)  Transportation Needs: No Transportation Needs (04/20/2024)  Utilities: Not At Risk (04/20/2024)  Tobacco Use: Low Risk  (04/11/2024)    Readmission Risk Interventions    04/21/2024    2:05 PM 04/13/2024    3:04 PM 11/16/2022    2:57 PM  Readmission Risk Prevention Plan  Transportation Screening Complete Complete Complete  Medication Review Oceanographer) Complete Complete Complete  PCP or Specialist appointment within 3-5 days of discharge Complete Complete Complete  HRI or Home Care Consult Complete Complete Complete  SW Recovery Care/Counseling Consult Complete Complete Complete  Palliative Care Screening Not Applicable Complete Complete  Skilled Nursing Facility Complete Complete Complete

## 2024-04-22 NOTE — Inpatient Diabetes Management (Signed)
 Inpatient Diabetes Program Recommendations  AACE/ADA: New Consensus Statement on Inpatient Glycemic Control (2015)  Target Ranges:  Prepandial:   less than 140 mg/dL      Peak postprandial:   less than 180 mg/dL (1-2 hours)      Critically ill patients:  140 - 180 mg/dL   Lab Results  Component Value Date   GLUCAP 238 (H) 04/22/2024   HGBA1C 9.1 (H) 04/20/2024    Review of Glycemic Control  Latest Reference Range & Units 04/21/24 08:32 04/21/24 12:26 04/21/24 17:34 04/21/24 21:18 04/22/24 09:11 04/22/24 12:19  Glucose-Capillary 70 - 99 mg/dL 718 (H) 686 (H) 695 (H) 238 (H) 338 (H) 238 (H)   Diabetes type 2 Outpatient Diabetes medications:  Trulicity  0.5 mg weekly Glipizide  10 mg QAM Humalog 2-8 units TID Farxiga  10 mg every day   Current orders for Inpatient glycemic control:  Semglee  15 units qhs Novolog  0-15 units tid Novolog  2 units tid meal coverage   Inpatient Diabetes Program Recommendations:   -   Increase Semglee  18 units -   Add Novolog  5 units tid meal coverage if eating >50% of meals -   Add Novolog  hs scale   Note: just d/c'd on 7/20  Thanks,  Clotilda Bull RN, MSN, BC-ADM Inpatient Diabetes Coordinator Team Pager [phone] (8a-5p)

## 2024-04-22 NOTE — Progress Notes (Signed)
 PROGRESS NOTE    Brett White  FMW:[id number] DOB: Feb 04, 1961 DOA: 04/20/2024 PCP: [facility]   Brief Narrative:  Daren Yeagle is a 63 y.o. 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  Patient is from [facility], facility stated patient was around 78% on room air.  Patient he is not on oxygen. In the ED, pt tachycardic. Labs notable for glucose 368. CXR w/o focal consolidations. CT chest w/ bilateral pleural effusions.  Of note, patient has multiple frequent hospitalization and concern for significant drug-seeking behavior.  Patient admitted for further management.    Yesterday, patient reported that his pain is all over his body, denies any specific chest pain or abdominal pain.  Laying in bed, appearing comfortable.  Currently on 2 L of oxygen, saturating well.  The night before last he is requesting around-the-clock Dilaudid  but now he is doing okay.  Esophagram showed severe esophageal dysmotility and SLP employing strategies to implement given his reduced clearance.  Will consult GI in the a.m. for further evaluation  Assessment and Plan:  Chest pain and this could be likely in the setting of esophageal dysmotility: Denies any current left-sided chest pain. Troponins negative. EKG with sinus tachycardia, no acute ST changes. Last echo done on 04/15/24, showed EF of 55 to 60%, no regional wall motion abnormality, left ventricular diastolic parameters are indeterminate. CTM on Telemetry  Telemetry   Acute Hypoxic Respiratory Failure Possible aspiration event/pneumonia Unlikely acute HFpEF exacerbation -Oxygen sats dropped to 78% on room air with recovery, possibly an aspiration event (if accurate) -Currently afebrile, with no leukocytosis -BNP 28, trended downwards from 163  -CXR w/o focal consolidations -CT chest with b/l tiny pleural effusions, pulmonary artery hypertension, noted dilated  esophagus with food/debris's within, suggest possibly esophageal dysmotility/GERD -Discontinue IV Lasix  for now -Esophagram done and even thought it was a limited study it showed Significant esophageal dysmotility with associated diffuse esophageal distension, stasis, and tertiary contractions. The Barium tablet lodged in distal esophagus near GE junction but no underlying severe stricture or obstruction appreciated fluoroscopically. -SLP evaluation being done  -Cover with IV Unasyn , may switch to p.o. Augmentin  to complete 5 days -Continue DuoNebs -Supplemental O2 as needed, plan to wean off; Repeat CXR in the AM and will need    Transaminitis/ Abnormal LFTs, improving -Elevated AST, ALT, alkaline phosphatase and T. bili WNL:  Recent Labs  Lab 03/28/24 0646 04/11/24 1740 04/15/24 0447 04/20/24 0920 04/22/24 0508  AST 35 31 27 134* 56*  ALT 43 20 25 128* 90*  BILITOT 0.4 0.9 0.5 0.5 0.7  ALKPHOS 83 90 96 125 109  -Unknown etiology -Right upper quadrant ultrasound showed normal hepatobiliary Hepatitis panel Negative    Chronic Diastolic HF, ruled out Acute Exacerbation: Appears euvolemic. 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 SSI, glargine, Accu-Cheks, hypoglycemic protocol On Farxiga , glipizide , Trulicity . CTM CBGs per Protocol. CBG Trend:  Recent Labs  Lab 04/21/24 0832 04/21/24 1226 04/21/24 1734 04/21/24 2118 04/22/24 0911 04/22/24 1219 04/22/24 1557  GLUCAP 281* 313* 304* 238* 338* 238* 129*   Renal Insuffiencey: BUN/Cr Trend: Recent Labs  Lab 04/14/24 0719 04/15/24 0447 04/16/24 0500 04/17/24 0352 04/18/24 0324 04/20/24 0920 04/22/24 0508  BUN 20 23* 30* 33* 27* 22* 36*  CREATININE 1.28* 1.42* 1.48* 1.20 0.96 1.13 1.38*  -Avoid Nephrotoxic Medications, Contrast Dyes, Hypotension and Dehydration to Ensure Adequate Renal Perfusion and will need  to Renally Adjust Meds -Continue to Monitor and Trend Renal Function carefully and  repeat CMP in the AM   Chronic pain: PTA lyrica  100mg  BID, reduced dose Dilaudid  2mg  q6h (4mg  q6h pta), and duloxetine  60mg  daily. He wasRequesting for IV Dilaudid , will continue for 1 more day, switch back to home regimen at a reduced dose (??may have been pretty drowsy or possibly aspirated)   Essential HTN: C/w PTA amlodipine  10mg  daily, hold losartan  for now. CTM BP per Protocol. Last BP reading was 115/61.     GERD/GI Prophylaxis: C/w PTA PPI  Ascending aorta dilation: Noted on recent Transthoracic Echocardiogram measuring 42 mm, in addition to dilation of the aortic root measuring 43 mm.   Hypoalbuminemia: Patient's Albumin  Trend: Recent Labs  Lab 03/28/24 0646 04/11/24 1740 04/15/24 0447 04/20/24 0920 04/22/24 0508  ALBUMIN  3.4* 3.0* 2.3* 3.0* 2.7*  -Continue to Monitor and Trend and repeat CMP in the AM  Class I Obesity: Complicates overall prognosis and care. Estimated body mass index is 33.26 kg/m as calculated from the following:   Height as of this encounter: 6' 2 (1.88 m).   Weight as of this encounter: 117.5 kg. Weight Loss and Dietary Counseling given   DVT prophylaxis: Enoxaparin  60 mg sq q24h    Code Status: Full Code Family Communication: No family present @ bedside  Disposition Plan:  Level of care: Telemetry Cardiac Status is: Inpatient Remains inpatient appropriate because: Needs further clinical improvement and will consult GI  Consultants:  None  Procedures:  As delineated as above  Antimicrobials:  Anti-infectives (From admission, onward)    Start     Dose/Rate Route Frequency Ordered Stop   04/21/24 1400  ampicillin -sulbactam (UNASYN ) 1.5 g in sodium chloride  0.9 % 100 mL IVPB        1.5 g 200 mL/hr over 30 Minutes Intravenous 4 times daily 04/21/24 1122         Subjective: Seen and examined at bedside he is resting in was asleep and woke up from sleep.  States that he is doing okay.  Feels okay but states he has not been eating much  given that he is having a lot of difficulty swallowing.  No nausea or vomiting.  Denies any other concerns or complaints at this time.  Objective: Vitals:   04/22/24 1321 04/22/24 1559 04/22/24 1847 04/22/24 1947  BP: (!) 155/86 (!) 149/87  115/61  Pulse: 88   78  Resp: 18 12  18   Temp:  98.1 F (36.7 C)  98.1 F (36.7 C)  TempSrc:  Oral  Oral  SpO2: 96%  98% 100%  Weight:      Height:        Intake/Output Summary (Last 24 hours) at 04/22/2024 2051 Last data filed at 04/22/2024 9095 Gross per 24 hour  Intake 300 ml  Output --  Net 300 ml   Filed Weights   04/20/24 0917 04/20/24 1819  Weight: 127.2 kg 117.5 kg   Examination: Physical Exam:  Constitutional: WN/WD, obese chronically ill-appearing Caucasian male in no acute distress was awoken from his sleep a little somnolent and drowsy Respiratory: Diminished to auscultation bilaterally with some coarse breath sounds and some slight rhonchi and mild crackles.  No appreciable wheezing or rales. Normal respiratory effort and patient is not tachypenic. No accessory muscle use.  Wearing supplemental oxygen via nasal cannula Cardiovascular: RRR, no murmurs / rubs / gallops. S1 and S2 auscultated.  Mild 1+ extremity edema on the right Abdomen: Soft, non-tender,  distended secondary to body habitus. Bowel sounds positive.  GU: Deferred. 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 No induration; Warm and dry.  Neurologic: CN 2-12 grossly intact with no focal deficits. Romberg sign and cerebellar reflexes not assessed.  Psychiatric: A little somnolent and drowsy  Data Reviewed: I have personally reviewed following labs and imaging studies  CBC: Recent Labs  Lab 04/17/24 0352 04/20/24 0920 04/22/24 0508  WBC 5.9 7.1 6.7  NEUTROABS  --   --  4.2  HGB 8.8* 11.5* 10.4*  HCT 29.5* 37.3* 34.0*  MCV 81.7 78.9* 79.8*  PLT 339 542* 509*   Basic Metabolic Panel: Recent Labs  Lab  04/16/24 0500 04/17/24 0352 04/18/24 0324 04/19/24 0326 04/20/24 0920 04/22/24 0508  NA 137 137 138  --  135 134*  K 4.7 5.0 4.8  --  4.9 4.7  CL 98 97* 98  --  95* 95*  CO2 30 31 31   --  27 30  GLUCOSE 276* 213* 196*  --  280* 329*  BUN 30* 33* 27*  --  22* 36*  CREATININE 1.48* 1.20 0.96  --  1.13 1.38*  CALCIUM  8.5* 8.6* 8.6*  --  9.3 8.9  MG 2.1 1.9 1.7 1.9  --   --    GFR: Estimated Creatinine Clearance: 77.5 mL/min (A) (by C-G formula based on SCr of 1.38 mg/dL (H)). Liver Function Tests: Recent Labs  Lab 04/20/24 0920 04/22/24 0508  AST 134* 56*  ALT 128* 90*  ALKPHOS 125 109  BILITOT 0.5 0.7  PROT 7.4 6.7  ALBUMIN  3.0* 2.7*   No results for input(s): LIPASE, AMYLASE in the last 168 hours. No results for input(s): AMMONIA in the last 168 hours. Coagulation Profile: No results for input(s): INR, PROTIME in the last 168 hours. Cardiac Enzymes: No results for input(s): CKTOTAL, CKMB, CKMBINDEX, TROPONINI in the last 168 hours. BNP (last 3 results) No results for input(s): PROBNP in the last 8760 hours. HbA1C: Recent Labs    04/20/24 1835  HGBA1C 9.1*   CBG: Recent Labs  Lab 04/21/24 1734 04/21/24 2118 04/22/24 0911 04/22/24 1219 04/22/24 1557  GLUCAP 304* 238* 338* 238* 129*   Lipid Profile: No results for input(s): CHOL, HDL, LDLCALC, TRIG, CHOLHDL, LDLDIRECT in the last 72 hours. Thyroid Function Tests: No results for input(s): TSH, T4TOTAL, FREET4, T3FREE, THYROIDAB in the last 72 hours. Anemia Panel: No results for input(s): VITAMINB12, FOLATE, FERRITIN, TIBC, IRON, RETICCTPCT in the last 72 hours. Sepsis Labs: Recent Labs  Lab 04/22/24 0818  PROCALCITON <0.10   No results found for this or any previous visit (from the past 240 hours).   Radiology Studies: DG ESOPHAGUS W SINGLE CM (SOL OR THIN BA) Result Date: 04/21/2024 CLINICAL DATA:  Dysphagia, choking sensation of food sticking in  throat and lower chest. Chest CT demonstrates stasis and food contents within the esophagus from yesterday. EXAM: ESOPHAGUS/BARIUM SWALLOW/TABLET STUDY TECHNIQUE: Single contrast examination was performed using thin liquid barium. Study significantly limited secondary to patient immobility. This exam was performed by Kimble Clas, PA-C, and was supervised and interpreted by Dr. Vanice. FLUOROSCOPY: Radiation Exposure Index (as provided by the fluoroscopic device): 100.20 mGy Kerma COMPARISON:  Please reference speech pathology modified barium swallow study on 11/25/23 FINDINGS: Swallowing: Unable to assess due to limited study Pharynx: Unable to assess due to limited study. Esophagus: Normal appearance. Esophageal motility: Significant esophageal dysmotility throughout proximal, mid, and distal esophagus with diffuse esophageal distension  and stasis. Tertiary contractions throughout. Hiatal Hernia: None. Gastroesophageal reflux: None visualized. Ingested 13 mm barium tablet: Became lodged in distal esophagus and at GE junction. Not visualized passing into the stomach after more than 6 minutes of observing and multiple sips of water  and thin barium. Despite this, no underlying severe stricture or obstruction appreciated. Other: None. IMPRESSION: Limited study. Significant esophageal dysmotility with associated diffuse esophageal distension, stasis, and tertiary contractions. Barium tablet lodged in distal esophagus near GE junction but no underlying severe stricture or obstruction appreciated fluoroscopically. Electronically Signed   By: CHRISTELLA.  Shick M.D.   On: 04/21/2024 14:54     Scheduled Meds:  amLODipine   10 mg Oral q morning   artificial tears  1 drop Left Eye QID   atorvastatin   40 mg Oral QHS   brimonidine   1 drop Left Eye BID   DULoxetine   60 mg Oral q AM   enoxaparin  (LOVENOX ) injection  60 mg Subcutaneous Q24H   famotidine   20 mg Oral q AM   insulin  aspart  0-15 Units Subcutaneous TID WC   insulin   aspart  2 Units Subcutaneous TID WC   insulin  glargine-yfgn  15 Units Subcutaneous QHS   pantoprazole   40 mg Oral Daily   polyethylene glycol  17 g Oral Daily   pregabalin   100 mg Oral BID   senna-docusate  1 tablet Oral BID   sertraline   50 mg Oral q AM   tamsulosin   0.4 mg Oral QPC supper   traZODone   75 mg Oral QHS   Continuous Infusions:  ampicillin -sulbactam (UNASYN ) IV 1.5 g (04/22/24 1722)    LOS: 2 days   Alejandro Marker, DO [facility] Available via Epic secure chat 7am-7pm After these hours, please refer to coverage provider listed on [email or web address] 04/22/2024, 8:51 PM

## 2024-04-22 NOTE — Hospital Course (Addendum)
 Brett White is a 63 y.o. 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  Patient is from [facility], facility stated patient was around 78% on room air.  Patient he is not on oxygen. In the ED, pt tachycardic. Labs notable for glucose 368. CXR w/o focal consolidations. CT chest w/ bilateral pleural effusions.  Of note, patient has multiple frequent hospitalization and concern for significant drug-seeking behavior. Patient admitted for further management.    On 04/21/24 patient reported that his pain is all over his body, denies any specific chest pain or abdominal pain.  Laying in bed, appearing comfortable.  Currently on 2 L of oxygen, saturating well  Esophagram showed severe esophageal dysmotility and SLP employing strategies to implement given his reduced clearance.  Consulted GI for further evaluation and take the patient for EGD which showed no endoscopic esophageal abnormality to explain the dysphagia but his esophagus was dilated.  The patient thinks that his swallowing is easier.  He is improving he is medically stable for discharge and will need to follow-up with PCP in gastrology outpatient setting.  Assessment and Plan:  Chest pain and this could be likely in the setting of esophageal dysmotility: Denies any current left-sided chest pain and continues to complain that his left lung pain.. Troponins negative. EKG with sinus tachycardia, no acute ST changes. Last echo done on 04/15/24, showed EF of 55 to 60%, no regional wall motion abnormality, left ventricular diastolic parameters are indeterminate. CTM on Telemetry.  Likely in the setting of his esophageal dysmotility and pneumonia.  If persists can be followed in outpatient setting   Acute Hypoxic Respiratory Failure in the setting of Likely aspiration event/pneumonia from Esophageal Dysmotility and Unlikely acute HFpEF exacerbation -Oxygen sats dropped to 78% on room  air with recovery, possibly an aspiration event (if accurate); SpO2: 92 %; O2 Flow Rate (L/min): 2 L/min; Now weaned off -Currently afebrile, with no leukocytosis -BNP 28, trended downwards from 163  -CXR w/o focal consolidations -CT chest with b/l tiny pleural effusions, pulmonary artery hypertension, noted dilated esophagus with food/debris's within, suggest possibly esophageal dysmotility/GERD; see below -Discontinue IV Lasix  for now and resume home p.o. Lasix  now -Esophagram done and even thought it was a limited study it showed Significant esophageal dysmotility with associated diffuse esophageal distension, stasis, and tertiary contractions. The Barium tablet lodged in distal esophagus near GE junction but no underlying severe stricture or obstruction appreciated fluoroscopically.  -Consulted GI because of this and they are planning on EGD on 04/24/24 and this showed LA Grade B esophagitis w/ no bleeding and no endoscopic esophageal abnormality to explain the patient's dysphagia however his esophagus was dilated.  Notably he was also found to have a 2 cm hiatal hernia and a normal stomach and normal examined duodenum and biopsies were taken with cold forceps -SLP evaluation being done and gave him precautions.  -Covered with IV Unasyn  and will switch to p.o. Augmentin  to complete 7 days -Continue DuoNebs -Supplemental O2 as needed, and was weaned off of supplemental oxygen for now -Repeat CXR done today and showed no active disease; Improved and medically stable for D/C   Esophageal Dysmotolity and Dysphagia: see Above   Transaminitis/ Abnormal LFTs, Resolved -Alkaline phosphatase and T. bili WNL. AST and ALT have normalized. Right upper quadrant ultrasound showed normal hepatobiliary; Hepatitis panel Negative  -CTM and Trend intermittently    Chronic Diastolic HF, ruled out Acute Exacerbation: Appears euvolemic. 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  Continue SSI with moderate NovoLog /scale insulin  ACHS, and continue long-acting Lantus  insulin  glargine 24 units subcu nightly and continue insulin  aspart 2 units SQ 3 times daily with meals, Accu-Cheks, hypoglycemic protocol On Farxiga , glipizide , Trulicity . CTM CBGs per Protocol. CBG Trend ranging from 211-305  Renal Insuffiencey: BUN/Cr Trend improved: Recent Labs  Lab 04/18/24 0324 04/20/24 0920 04/22/24 0508 04/23/24 0435 04/24/24 0540 04/25/24 0248 04/26/24 0501 04/27/24 0536  BUN 27* 22* 36* 31* 22* 20 19  --   CREATININE 0.96 1.13 1.38* 1.16 1.06 1.21 1.11 1.10  -Avoid Nephrotoxic Medications, Contrast Dyes, Hypotension and Dehydration to Ensure Adequate Renal Perfusion and will need to Renally Adjust Meds -Continue to Monitor and Trend Renal Function carefully and repeat CMP in the AM   Chronic pain: PTA Lyrica  100mg  BID, reduced dose Dilaudid  2mg  q6h (4mg  q6h pta), and duloxetine  60mg  daily. Resume po Diluadid for now and give a short course of Oxycodone  IR for Moderate Pain   Essential HTN: C/w PTA amlodipine  10mg  daily, hold losartan  for now. CTM BP per Protocol. Last BP reading was 117/76.     GERD/GI Prophylaxis: C/w PTA PPI and now on BID dosing   Ascending Aorta Dilation: Noted on recent Transthoracic Echocardiogram measuring 42 mm, in addition to dilation of the aortic root measuring 43 mm.   Hypoalbuminemia: Patient's Albumin  Trend ranging from 2.3-3.4. (2.6 on last check) CTM and Trend and repeat CMP in the AM  Class I Obesity: Complicates overall prognosis and care. Estimated body mass index is 33.26 kg/m as calculated from the following:   Height as of this encounter: 6' 2 (1.88 m).   Weight as of this encounter: 117.5 kg. Weight Loss and Dietary Counseling given

## 2024-04-23 ENCOUNTER — Inpatient Hospital Stay (HOSPITAL_COMMUNITY)

## 2024-04-23 DIAGNOSIS — J690 Pneumonitis due to inhalation of food and vomit: Secondary | ICD-10-CM | POA: Diagnosis not present

## 2024-04-23 DIAGNOSIS — K224 Dyskinesia of esophagus: Secondary | ICD-10-CM | POA: Diagnosis not present

## 2024-04-23 DIAGNOSIS — R1310 Dysphagia, unspecified: Secondary | ICD-10-CM

## 2024-04-23 DIAGNOSIS — J9601 Acute respiratory failure with hypoxia: Secondary | ICD-10-CM | POA: Diagnosis not present

## 2024-04-23 LAB — COMPREHENSIVE METABOLIC PANEL WITH GFR
ALT: 70 U/L — ABNORMAL HIGH (ref 0–44)
AST: 39 U/L (ref 15–41)
Albumin: 2.6 g/dL — ABNORMAL LOW (ref 3.5–5.0)
Alkaline Phosphatase: 100 U/L (ref 38–126)
Anion gap: 7 (ref 5–15)
BUN: 31 mg/dL — ABNORMAL HIGH (ref 6–20)
CO2: 28 mmol/L (ref 22–32)
Calcium: 8.6 mg/dL — ABNORMAL LOW (ref 8.9–10.3)
Chloride: 98 mmol/L (ref 98–111)
Creatinine, Ser: 1.16 mg/dL (ref 0.61–1.24)
GFR, Estimated: >60 mL/min (ref >60)
Glucose, Bld: 372 mg/dL — ABNORMAL HIGH (ref 70–99)
Potassium: 4.5 mmol/L (ref 3.5–5.1)
Sodium: 133 mmol/L — ABNORMAL LOW (ref 135–145)
Total Bilirubin: 0.4 mg/dL (ref 0.0–1.2)
Total Protein: 6.4 g/dL — ABNORMAL LOW (ref 6.5–8.1)

## 2024-04-23 LAB — CBC WITH DIFFERENTIAL/PLATELET
Abs Immature Granulocytes: 0.08 K/uL — ABNORMAL HIGH (ref 0.00–0.07)
Basophils Absolute: 0.1 K/uL (ref 0.0–0.1)
Basophils Relative: 1 %
Eosinophils Absolute: 0.2 K/uL (ref 0.0–0.5)
Eosinophils Relative: 4 %
HCT: 33.4 % — ABNORMAL LOW (ref 39.0–52.0)
Hemoglobin: 10.2 g/dL — ABNORMAL LOW (ref 13.0–17.0)
Immature Granulocytes: 1 %
Lymphocytes Relative: 31 %
Lymphs Abs: 1.8 K/uL (ref 0.7–4.0)
MCH: 24.5 pg — ABNORMAL LOW (ref 26.0–34.0)
MCHC: 30.5 g/dL (ref 30.0–36.0)
MCV: 80.1 fL (ref 80.0–100.0)
Monocytes Absolute: 0.4 K/uL (ref 0.1–1.0)
Monocytes Relative: 7 %
Neutro Abs: 3.1 K/uL (ref 1.7–7.7)
Neutrophils Relative %: 56 %
Platelets: 480 K/uL — ABNORMAL HIGH (ref 150–400)
RBC: 4.17 MIL/uL — ABNORMAL LOW (ref 4.22–5.81)
RDW: 18.6 % — ABNORMAL HIGH (ref 11.5–15.5)
WBC: 5.7 K/uL (ref 4.0–10.5)
nRBC: 0.4 % — ABNORMAL HIGH (ref 0.0–0.2)

## 2024-04-23 LAB — MAGNESIUM: Magnesium: 2 mg/dL (ref 1.7–2.4)

## 2024-04-23 LAB — GLUCOSE, CAPILLARY
Glucose-Capillary: 341 mg/dL — ABNORMAL HIGH (ref 70–99)
Glucose-Capillary: 270 mg/dL — ABNORMAL HIGH (ref 70–99)
Glucose-Capillary: 146 mg/dL — ABNORMAL HIGH (ref 70–99)

## 2024-04-23 LAB — PHOSPHORUS: Phosphorus: 4 mg/dL (ref 2.5–4.6)

## 2024-04-23 MED ORDER — HYDROMORPHONE HCL 1 MG/ML IJ SOLN
0.5000 mg | INTRAMUSCULAR | Status: AC | PRN
  Administered 2024-04-23 – 2024-04-24 (×4): 0.5 mg via INTRAVENOUS
  Filled 2024-04-23 (×4): qty 1

## 2024-04-23 MED ORDER — SODIUM CHLORIDE 0.9 % IV SOLN: INTRAVENOUS | Status: DC

## 2024-04-23 NOTE — Consult Note (Cosign Needed)
 Consultation Note   Referring Provider:  [facility] PCP: [facility] Gastro[facility]ologist:  Sampson       Reason for Consultation:  Dysphagia, abnormal barium swallow DOA: 04/20/2024         Hospital Day: 4   ASSESSMENT    63 y.o. year old male with a medical history including but not limited to chronic pain, DM, hypertension , chronic diastolic heart failure , GERD.  Patient mated with chest pain.  He has reported dysphagia  Chronic diastolic heart failure Chest pain not felt to be cardiac related Troponins negative, no acute changes on EKG  Chronic dysphagia to solids and pills Esophageal dysmotility Abnormal Barium swallow with tablet Chronic dysphagia likely all secondary to esophageal dysmotility based on barium swallow studies but need to exclude mass/stricture   Chronic anemia with microcytosis Hemoglobin 10.2 which is near baseline.  Anemia chronic but microcytosis evolving over the last several months.  Elevated liver enzymes, improving A few days ago AST was in the 130s /ALT 120s.  No hepatobiliary findings on RUQ US   See PMH for additional history  Principal Problem:   Acute hypoxemic respiratory failure (HCC)     PLAN:   --Schedule for EGD to be done tomorrow. The risks and benefits of EGD with possible biopsies were discussed with the patient who agrees to proceed.  -- Clear liquids for dinner this evening and n.p.o. after midnight to hopefully clear esophagus of any remaining food/debris -- Patient is on Lovenox  60 mg daily.   I have discontinued Lovenox  after tonight's dose in case esophageal dilation is warranted.  -- Continue daily pantoprazole  before breakfast.  He is also getting famotidine  in the morning.  It is often more helpful for patients to take famotidine  at bedtime.  -- Checking iron studies   HPI   63 y.o. year old male with a medical history including but not  limited to chronic pain, DM, hypertension , chronic diastolic heart failure , GERD.  Patient admitted with chest pain  Kathryn was just discharged from the hospital on 7/20 after being admitted with acute on chronic renal failure, and acute respiratory failure felt to be secondary to acute diastolic heart failure.  Symptoms resolved with Lasix .  He was readmitted on 04/20/2024 with chest pressure.  Troponins were negative.  No acute ST changes on EKG .  He was hypoxic, admitted with acute hypoxic respiratory failure.     Patient gives about a 1 year history of dysphagia, mainly to solids and pills.  No difficulty with liquids.  He does not feel that the dysphagia has progressed since it started but it has certainly has not improved.  He has a history of GERD, takes pantoprazole  daily.  He does report about a 10 pound weight loss over the last several weeks  Chest CT scan  IMPRESSION: 1. No acute findings. 2. Tiny bilateral pleural effusions 3. Aortic and coronary artery atherosclerosis 4.  Pulmonary artery enlargement suggests pulmonary arterial hypertension. 5. Dilated esophagus with food/debris within. This suggests esophageal dysmotility and/or gastroesophageal reflux.   Barium swallow shows significant esophageal dysmotility with associated diffuse esophageal distention, stasis and tertiary contractions.  Barium lodged in the distal esophagus near the GE junction.  No severe stricture or obstruction was appreciated however  AST 39, ALT 70 (trending down from 120s a few days ago).  Recent RUQ ultrasound normal.      Labs and Imaging:  Recent Labs    04/22/24 0508 04/23/24 0435  PROT 6.7 6.4*  ALBUMIN  2.7* 2.6*  AST 56* 39  ALT 90* 70*  ALKPHOS 109 100  BILITOT 0.7 0.4   Recent Labs    04/22/24 0508 04/23/24 0435  WBC 6.7 5.7  HGB 10.4* 10.2*  HCT 34.0* 33.4*  MCV 79.8* 80.1  PLT 509* 480*   Recent Labs    04/22/24 0508 04/23/24 0435  NA 134* 133*  K 4.7 4.5  CL 95* 98   CO2 30 28  GLUCOSE 329* 372*  BUN 36* 31*  CREATININE 1.38* 1.16  CALCIUM  8.9 8.6*     DG CHEST PORT 1 VIEW CLINICAL DATA:  Short of breath  EXAM: PORTABLE CHEST 1 VIEW  COMPARISON:  None Available.  FINDINGS: Normal mediastinum and cardiac silhouette. Normal pulmonary vasculature. No evidence of effusion, infiltrate, or pneumothorax. No acute bony abnormality.  IMPRESSION: No acute cardiopulmonary process.  Electronically Signed   By: Jackquline Boxer M.D.   On: 04/23/2024 09:30    Pertinent GI Studies    None   Past Medical History:  Diagnosis Date   CAP (community acquired pneumonia) 12/09/2021   CKD (chronic kidney disease)    GERD (gastroesophageal reflux disease)    HTN (hypertension)    Insulin  dependent type 2 diabetes mellitus (HCC)    Neuropathy    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   Stroke [facility])     Past Surgical History:  Procedure Laterality Date   ABDOMINAL AORTOGRAM W/LOWER EXTREMITY N/A 06/20/2023   Procedure: ABDOMINAL AORTOGRAM W/LOWER EXTREMITY;  Surgeon: Gretta Lonni PARAS, MD;  Location: [facility] INV[facility]IVE [facility];  Service: Cardiovascular;  Laterality: N/A;   AMPUTATION Left 02/04/2021   Procedure: LEFT GREAT TOE AMPUTATION;  Surgeon: Harden Jerona GAILS, MD;  [facility];  Service: Orthopedics;  Laterality: Left;   AMPUTATION Left 02/10/2021   Procedure: LEFT FOOT 1ST RAY AMPUTATION;  Surgeon: Harden Jerona GAILS, MD;  Location[facility] OR;  Service: Orthopedics;  Laterality: Left;   AMPUTATION Left 03/22/2021   Procedure: LEFT BELOW KNEE AMPUTATION;  Surgeon: Harden Jerona GAILS, MD;  Location: [facility] OR;  Service: Orthopedics;  Laterality: Left;   APPENDECTOMY     BACK SURGERY     CATARACT EXTRACTION     I & D EXTREMITY Left 03/03/2021   Procedure: LEFT FOOT DEBRIDEMENT;  Surgeon: Harden Jerona GAILS, MD;  Location: [facility] OR;  Service: Orthopedics;  Laterality: Left;   INCISION AND DRAINAGE ABSCESS N/A 10/06/2021   Procedure: INCISION  AND DRAINAGE BACK ABSCESS;  Surgeon: Signe Mitzie LABOR, MD;  Location: [facility] OR;  Service: General;  Laterality: N/A;   INJECTION OF SILICONE OIL Left 07/25/2023   Procedure: INJECTION OF SILICONE OIL;  Surgeon: Valdemar Rogue, MD;  Location: [facility] OR;  Service: Ophthalmology;  Laterality: Left;   MEMBRANE PEEL Left 07/25/2023   Procedure: MEMBRANE PEEL;  Surgeon: Valdemar Rogue, MD;  Location: [facility] OR;  Service: Ophthalmology;  Laterality: Left;   PARS PLANA VITRECTOMY Left 07/25/2023   Procedure: PARS PLANA VITRECTOMY WITH 25 GAUGE;  Surgeon: Valdemar Rogue, MD;  Location: [facility] OR;  Service: Ophthalmology;  Laterality: Left;   removal of back cyst     TONSILLECTOMY      Family History  Problem  Relation Age of Onset   Diabetes Mother    Heart attack Neg Hx     Prior to Admission medications   Medication Sig Start Date End Date Taking? Authorizing Provider  amLODipine  (NORVASC ) 10 MG tablet Take 10 mg by mouth every morning. 05/11/22  Yes [provider]  atorvastatin  (LIPITOR) 40 MG tablet Take 40 mg by mouth at bedtime. 11/07/21  Yes [provider]  brimonidine  (ALPHAGAN ) 0.2 % ophthalmic solution Place 1 drop into the left eye in the morning and at bedtime. 08/29/23  Yes [provider]  carboxymethylcellulose 1 % ophthalmic solution Place 4 drops into the left eye in the morning, at noon, in the evening, and at bedtime.   Yes [provider]  DULoxetine  (CYMBALTA ) 60 MG capsule Take 60 mg by mouth in the morning. 08/28/23  Yes [provider]  ergocalciferol  (VITAMIN D2) 1.25 MG (50000 UT) capsule Take 50,000 Units by mouth once a week. Fridays   Yes [provider]  famotidine  (PEPCID ) 20 MG tablet Take 20 mg by mouth in the morning. 04/20/23  Yes [provider]  FARXIGA  10 MG TABS tablet Take 10 mg by mouth every morning. Dapaglifozin 02/07/22  Yes [provider]  feeding supplement (ENSURE PLUS HIGH PROTEIN) LIQD Take 237 mLs  by mouth 2 (two) times daily between meals. 04/19/24  Yes Briana Elgin LABOR, MD  furosemide  (LASIX ) 20 MG tablet Take 20 mg by mouth every morning. 05/11/22  Yes [provider]  glipiZIDE  (GLUCOTROL ) 10 MG tablet Take 10 mg by mouth every morning. 05/11/22  Yes [provider]  LINZESS  145 MCG CAPS capsule Take 145 mcg by mouth in the morning. 06/11/23  Yes [provider]  losartan  (COZAAR ) 100 MG tablet Take 0.5 tablets (50 mg total) by mouth in the morning. 03/24/24  Yes Ezenduka, Nkeiruka J, MD  Melatonin 5 MG CAPS Take 10 mg by mouth at bedtime.   Yes [provider]  pantoprazole  (PROTONIX ) 40 MG tablet Take 1 tablet (40 mg total) by mouth daily. Patient taking differently: Take 40 mg by mouth in the morning. 10/20/21  Yes Ghimire, Donalda HERO, MD  pregabalin  (LYRICA ) 100 MG capsule Take 1 capsule (100 mg total) by mouth 2 (two) times daily. 04/19/24  Yes Briana Elgin LABOR, MD  sertraline  (ZOLOFT ) 50 MG tablet Take 50 mg by mouth in the morning. 04/30/23  Yes [provider]  tamsulosin  (FLOMAX ) 0.4 MG CAPS capsule Take 1 capsule (0.4 mg total) by mouth daily after supper. 03/24/24 04/23/24 Yes Ezenduka, Nkeiruka J, MD  traZODone  (DESYREL ) 150 MG tablet Take 75 mg by mouth at bedtime. 05/30/23  Yes [provider]  TRULICITY  1.5 MG/0.5ML SOPN Inject 0.5 mg into the skin once a week. On Thursdays 04/04/23  Yes [provider]  HYDROmorphone  (DILAUDID ) 4 MG tablet Take 1 tablet (4 mg total) by mouth every 6 (six) hours as needed for moderate pain (pain score 4-6) or severe pain (pain score 7-10). Patient not taking: Reported on 04/20/2024 04/19/24   Briana Elgin LABOR, MD  polyethylene glycol (MIRALAX  / GLYCOLAX ) 17 g packet Take 17 g by mouth daily. Patient not taking: Reported on 04/20/2024 11/18/22   Krishnan, Gokul, MD    Current Facility-Administered Medications  Medication Dose Route Frequency Provider Last Rate Last Admin   amLODipine  (NORVASC )  tablet 10 mg  10 mg Oral q morning Moore, Willie, MD   10 mg at 04/23/24 9056   ampicillin -sulbactam (UNASYN ) 1.5  g in sodium chloride  0.9 % 100 mL IVPB  1.5 g Intravenous QID Brovey, Karlen, RPH 200 mL/hr at 04/23/24 1502 1.5 g at 04/23/24 1502   artificial tears ophthalmic solution 1 drop  1 drop Left Eye QID Georgina Basket, MD   1 drop at 04/23/24 1501   atorvastatin  (LIPITOR) tablet 40 mg  40 mg Oral QHS Georgina Basket, MD   40 mg at 04/22/24 2056   brimonidine  (ALPHAGAN ) 0.2 % ophthalmic solution 1 drop  1 drop Left Eye BID Georgina Basket, MD   1 drop at 04/23/24 0944   DULoxetine  (CYMBALTA ) DR capsule 60 mg  60 mg Oral q AM Georgina Basket, MD   60 mg at 04/23/24 0643   enoxaparin  (LOVENOX ) injection 60 mg  60 mg Subcutaneous Q24H Georgina Basket, MD   60 mg at 04/22/24 2057   famotidine  (PEPCID ) tablet 20 mg  20 mg Oral q AM Georgina Basket, MD   20 mg at 04/23/24 9356   HYDROmorphone  (DILAUDID ) injection 0.5 mg  0.5 mg Intravenous Q4H PRN Sheikh, Omair Latif, DO   0.5 mg at 04/23/24 1446   insulin  aspart (novoLOG ) injection 0-15 Units  0-15 Units Subcutaneous TID WC Georgina Basket, MD   8 Units at 04/23/24 1225   insulin  aspart (novoLOG ) injection 2 Units  2 Units Subcutaneous TID WC Ezenduka, Nkeiruka J, MD   2 Units at 04/23/24 1225   insulin  glargine-yfgn (SEMGLEE ) injection 15 Units  15 Units Subcutaneous QHS Ezenduka, Nkeiruka J, MD   15 Units at 04/22/24 2057   ipratropium-albuterol  (DUONEB) 0.5-2.5 (3) MG/3ML nebulizer solution 3 mL  3 mL Nebulization Q6H PRN Ezenduka, Nkeiruka J, MD       oxyCODONE  (Oxy IR/ROXICODONE ) immediate release tablet 5-10 mg  5-10 mg Oral Q6H PRN Ezenduka, Nkeiruka J, MD   10 mg at 04/23/24 9357   pantoprazole  (PROTONIX ) EC tablet 40 mg  40 mg Oral Daily Moore, Willie, MD   40 mg at 04/23/24 0943   polyethylene glycol (MIRALAX  / GLYCOLAX ) packet 17 g  17 g Oral Daily Ezenduka, Nkeiruka J, MD   17 g at 04/22/24 9143   pregabalin  (LYRICA ) capsule 100 mg  100 mg Oral BID  Georgina Basket, MD   100 mg at 04/23/24 9056   senna-docusate (Senokot-S) tablet 1 tablet  1 tablet Oral BID Ezenduka, Nkeiruka J, MD   1 tablet at 04/23/24 9056   sertraline  (ZOLOFT ) tablet 50 mg  50 mg Oral q AM Georgina Basket, MD   50 mg at 04/23/24 9356   tamsulosin  (FLOMAX ) capsule 0.4 mg  0.4 mg Oral QPC supper Georgina Basket, MD   0.4 mg at 04/22/24 1702   traZODone  (DESYREL ) tablet 75 mg  75 mg Oral QHS Georgina Basket, MD   75 mg at 04/22/24 2056    Allergies as of 04/20/2024   (No Known Allergies)    Social History   Socioeconomic History   Marital status: Divorced    Spouse name: Not on file   Number of children: Not on file   Years of education: Not on file   Highest education level: Not on file  Occupational History   Not on file  Tobacco Use   Smoking status: Never   Smokeless tobacco: Never  Vaping Use   Vaping status: Never Used  Substance and Sexual Activity   Alcohol  use: Not Currently   Drug use: Not Currently   Sexual activity: Not Currently  Other Topics Concern   Not on file  Social History Narrative   Not on file   Social Drivers of Health   Financial Resource Strain: Not on file  Food Insecurity: No Food Insecurity (04/20/2024)   Hunger Vital Sign    Worried About Running Out of Food in the Last Year: Never true    Ran Out of Food in the Last Year: Never true  Transportation Needs: No Transportation Needs (04/20/2024)   PRAPARE - Administrator, Civil Service (Medical): No    Lack of Transportation (Non-Medical): No  Physical Activity: Not on file  Stress: Not on file  Social Connections: Not on file  Intimate Partner Violence: Not At Risk (04/20/2024)   Humiliation, Afraid, Rape, and Kick questionnaire    Fear of Current or Ex-Partner: No    Emotionally Abused: No    Physically Abused: No    Sexually Abused: No     Code Status   Code Status: Full Code  Review of Systems: All systems reviewed and negative except where noted in  HPI.  Physical Exam: Vital signs in last 24 hours: Temp:  [97.6 F (36.4 C)-98.1 F (36.7 C)] 97.9 F (36.6 C) (07/24 1603) Pulse Rate:  [78-98] 94 (07/24 1603) Resp:  [11-19] 17 (07/24 1603) BP: (115-141)/(61-94) 141/94 (07/24 1603) SpO2:  [94 %-100 %] 99 % (07/24 1324) Last BM Date : 04/22/24  General:  Pleasant male in NAD Psych:  Cooperative. Normal mood and affect Eyes: Pupils equal Ears:  Normal auditory acuity Nose: No deformity, discharge or lesions Neck:  Supple, no masses felt Lungs:  Clear to auscultation.  Heart:  Regular rate, regular rhythm.  Abdomen:  Soft, nondistended, nontender, active bowel sounds, no masses felt Rectal :  Deferred Msk: Symmetrical without gross deformities.  Neurologic:  Alert, oriented, grossly normal neurologically Skin:  Intact without significant lesions.    Intake/Output from previous day: 07/23 0701 - 07/24 0700 In: 780 [P.O.:780] Out: 1300 [Urine:1300] Intake/Output this shift:  Total I/O In: 480 [P.O.:480] Out: -    Vina Dasen, NP-C   04/23/2024, 4:20 PM

## 2024-04-23 NOTE — Progress Notes (Signed)
 SATURATION QUALIFICATIONS: (This note is used to comply with regulatory documentation for home oxygen)  Patient Saturations on 3 Liters at Rest = 98%   Patient Saturations on 3 Liters of oxygen while Ambulating = 94%  Please briefly explain why patient needs home oxygen: Pt able to maintain O2 saturation levels on 3L for short distance mobility ~3ft. Unable to test further distances d/t pt's prosthetic not present.   Brett White C, OT  Acute Rehabilitation Services Office [phone] Secure chat preferred

## 2024-04-23 NOTE — Inpatient Diabetes Management (Signed)
 Inpatient Diabetes Program Recommendations  AACE/ADA: New Consensus Statement on Inpatient Glycemic Control (2015)  Target Ranges:  Prepandial:   less than 140 mg/dL      Peak postprandial:   less than 180 mg/dL (1-2 hours)      Critically ill patients:  140 - 180 mg/dL   Lab Results  Component Value Date   GLUCAP 341 (H) 04/23/2024   HGBA1C 9.1 (H) 04/20/2024    Review of Glycemic Control   Latest Reference Range & Units 04/22/24 09:11 04/22/24 12:19 04/22/24 15:57 04/22/24 21:09 04/23/24 08:58  Glucose-Capillary 70 - 99 mg/dL 661 (H) 761 (H) 870 (H) 261 (H) 341 (H)   Diabetes type 2 Outpatient Diabetes medications:  Trulicity  0.5 mg weekly Glipizide  10 mg QAM Humalog 2-8 units TID Farxiga  10 mg every day   Current orders for Inpatient glycemic control:  Semglee  15 units qhs Novolog  0-15 units tid Novolog  2 units tid meal coverage   Inpatient Diabetes Program Recommendations:   -   Increase Semglee  18 units -   Add Novolog  hs scale -   Reduce Novolog  correction scale to 0-9 units tid   Thanks,  Clotilda Bull RN, MSN, BC-ADM Inpatient Diabetes Coordinator Team Pager [phone] (8a-5p)

## 2024-04-23 NOTE — Progress Notes (Signed)
 PROGRESS NOTE    Brett White  FMW:[id number] DOB: 03-21-1961 DOA: 04/20/2024 PCP: [facility]   Brief Narrative:  Brett White is a 63 y.o. 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  Patient is from [facility], facility stated patient was around 78% on room air.  Patient he is not on oxygen. In the ED, pt tachycardic. Labs notable for glucose 368. CXR w/o focal consolidations. CT chest w/ bilateral pleural effusions.  Of note, patient has multiple frequent hospitalization and concern for significant drug-seeking behavior.  Patient admitted for further management.    On 04/21/24 patient reported that his pain is all over his body, denies any specific chest pain or abdominal pain.  Laying in bed, appearing comfortable.  Currently on 2 L of oxygen, saturating well  Esophagram showed severe esophageal dysmotility and SLP employing strategies to implement given his reduced clearance.  Consulted GI for further evaluation and plan is for EGD in the AM   Assessment and Plan:  Chest pain and this could be likely in the setting of esophageal dysmotility: Denies any current left-sided chest pain. Troponins negative. EKG with sinus tachycardia, no acute ST changes. Last echo done on 04/15/24, showed EF of 55 to 60%, no regional wall motion abnormality, left ventricular diastolic parameters are indeterminate. CTM on Telemetry  Telemetry   Acute Hypoxic Respiratory Failure in the setting of Likely aspiration event/pneumonia from Esophageal Dysmotility and Unlikely acute HFpEF exacerbation -Oxygen sats dropped to 78% on room air with recovery, possibly an aspiration event (if accurate) -Currently afebrile, with no leukocytosis -BNP 28, trended downwards from 163  -CXR w/o focal consolidations -CT chest with b/l tiny pleural effusions, pulmonary artery hypertension, noted dilated esophagus with food/debris's  within, suggest possibly esophageal dysmotility/GERD; see below -Discontinue IV Lasix  for now -Esophagram done and even thought it was a limited study it showed Significant esophageal dysmotility with associated diffuse esophageal distension, stasis, and tertiary contractions. The Barium tablet lodged in distal esophagus near GE junction but no underlying severe stricture or obstruction appreciated fluoroscopically.  -Consulted GI because of this and they are planning on EGD on 04/24/24.  -SLP evaluation being done and gave him precautions.  -Cover with IV Unasyn , may switch to p.o. Augmentin  to complete 5 days -Continue DuoNebs -Supplemental O2 as needed, plan to wean off; Repeat CXR in the AM and will need   Esophageal Dysmotolity and Dysphagia: see Above   Transaminitis/ Abnormal LFTs, improving -Elevated AST, ALT, alkaline phosphatase and T. bili WNL:  Recent Labs  Lab 03/28/24 0646 04/11/24 1740 04/15/24 0447 04/20/24 0920 04/22/24 0508 04/23/24 0435  AST 35 31 27 134* 56* 39  ALT 43 20 25 128* 90* 70*  BILITOT 0.4 0.9 0.5 0.5 0.7 0.4  ALKPHOS 83 90 96 125 109 100  -Unknown etiology but improving.  -Right upper quadrant ultrasound showed normal hepatobiliary; Hepatitis panel Negative  -CTM and Trend intermittently    Chronic Diastolic HF, ruled out Acute Exacerbation: Appears euvolemic. 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 SSI, glargine, Accu-Cheks, hypoglycemic protocol On Farxiga , glipizide , Trulicity . CTM CBGs per Protocol. CBG Trend:  Recent Labs  Lab 04/22/24 0911 04/22/24 1219 04/22/24 1557 04/22/24 2109 04/23/24 0858 04/23/24 1223 04/23/24 1607  GLUCAP 338* 238* 129* 261* 341* 270* 146*   Renal Insuffiencey: BUN/Cr Trend: Recent Labs  Lab 04/15/24 0447 04/16/24 0500 04/17/24 0352 04/18/24 0324 04/20/24  0920 04/22/24 0508 04/23/24 0435  BUN 23* 30* 33* 27* 22* 36* 31*  CREATININE 1.42* 1.48* 1.20 0.96 1.13 1.38* 1.16   -Avoid Nephrotoxic Medications, Contrast Dyes, Hypotension and Dehydration to Ensure Adequate Renal Perfusion and will need to Renally Adjust Meds -Continue to Monitor and Trend Renal Function carefully and repeat CMP in the AM   Chronic pain: PTA lyrica  100mg  BID, reduced dose Dilaudid  2mg  q6h (4mg  q6h pta), and duloxetine  60mg  daily. Resume IV diluadid for now and c/w other regimen per [facility]   Essential HTN: C/w PTA amlodipine  10mg  daily, hold losartan  for now. CTM BP per Protocol. Last BP reading was 115/61.     GERD/GI Prophylaxis: C/w PTA PPI  Ascending aorta dilation: Noted on recent Transthoracic Echocardiogram measuring 42 mm, in addition to dilation of the aortic root measuring 43 mm.   Hypoalbuminemia: Patient's Albumin  Trend: Recent Labs  Lab 03/28/24 0646 04/11/24 1740 04/15/24 0447 04/20/24 0920 04/22/24 0508 04/23/24 0435  ALBUMIN  3.4* 3.0* 2.3* 3.0* 2.7* 2.6*  -Continue to Monitor and Trend and repeat CMP in the AM  Class I Obesity: Complicates overall prognosis and care. Estimated body mass index is 33.26 kg/m as calculated from the following:   Height as of this encounter: 6' 2 (1.88 m).   Weight as of this encounter: 117.5 kg. Weight Loss and Dietary Counseling given   DVT prophylaxis: Holding Enoxaparin  given EGD    Code Status: Full Code Family Communication: No family present @ bedside   Disposition Plan:  Level of care: Telemetry Cardiac Status is: Inpatient Remains inpatient appropriate because: Needs further clinical improvement and clearance by the specialists   Consultants:  Gastroenterology  Procedures:  As delineated as above  Antimicrobials:  Anti-infectives (From admission, onward)    Start     Dose/Rate Route Frequency Ordered Stop   04/21/24 1400  ampicillin -sulbactam (UNASYN ) 1.5 g in sodium chloride  0.9 % 100 mL IVPB        1.5 g 200 mL/hr over 30 Minutes Intravenous 4 times daily 04/21/24 1122         Subjective: Seen and  examined at bedside and states that he is still having some pain especially when he takes a deep breath in.  No nausea or vomiting.  Given his esophageal study we have consulted GI and plans for EGD.  He denied any other concerns or complaints at this time.  Objective: Vitals:   04/23/24 0642 04/23/24 0858 04/23/24 1324 04/23/24 1603  BP:  118/79 (!) 127/91 (!) 141/94  Pulse: 84 98 93 94  Resp: 15 11 18 17   Temp:  98 F (36.7 C) (P) 98 F (36.7 C) 97.9 F (36.6 C)  TempSrc:  Oral (P) Oral Axillary  SpO2: 95% 94% 99%   Weight:      Height:        Intake/Output Summary (Last 24 hours) at 04/23/2024 2014 Last data filed at 04/23/2024 1500 Gross per 24 hour  Intake 960 ml  Output 1300 ml  Net -340 ml   Filed Weights   04/20/24 0917 04/20/24 1819  Weight: 127.2 kg 117.5 kg   Examination: Physical Exam:  Constitutional: WN/WD obese chronically ill-appearing Caucasian male in no acute distress Respiratory: Diminished to auscultation bilaterally with some coarse breath sounds and has some slight rhonchi and crackles but no appreciable wheezing or rales.  Has a normal respiratory effort is not tachypneic but is wearing supplemental oxygen nasal cannula.  Cardiovascular: RRR, no murmurs / rubs / gallops. S1  and S2 auscultated.  There is mild 1+ lower extremity edema on the right and has a left BKA Abdomen: Soft, non-tender, distended secondary to body habitus. Bowel sounds positive.  GU: Deferred. Musculoskeletal: Has a left BKA Skin: No rashes, lesions, ulcers. No induration; Warm and dry.  Neurologic: CN 2-12 grossly intact with no focal deficits. Romberg sign and cerebellar reflexes not assessed.  Psychiatric: Normal judgment and insight. Alert and oriented x 3  Data Reviewed: I have personally reviewed following labs and imaging studies  CBC: Recent Labs  Lab 04/17/24 0352 04/20/24 0920 04/22/24 0508 04/23/24 0435  WBC 5.9 7.1 6.7 5.7  NEUTROABS  --   --  4.2 3.1  HGB  8.8* 11.5* 10.4* 10.2*  HCT 29.5* 37.3* 34.0* 33.4*  MCV 81.7 78.9* 79.8* 80.1  PLT 339 542* 509* 480*   Basic Metabolic Panel: Recent Labs  Lab 04/17/24 0352 04/18/24 0324 04/19/24 0326 04/20/24 0920 04/22/24 0508 04/23/24 0435  NA 137 138  --  135 134* 133*  K 5.0 4.8  --  4.9 4.7 4.5  CL 97* 98  --  95* 95* 98  CO2 31 31  --  27 30 28   GLUCOSE 213* 196*  --  280* 329* 372*  BUN 33* 27*  --  22* 36* 31*  CREATININE 1.20 0.96  --  1.13 1.38* 1.16  CALCIUM  8.6* 8.6*  --  9.3 8.9 8.6*  MG 1.9 1.7 1.9  --   --  2.0  PHOS  --   --   --   --   --  4.0   GFR: Estimated Creatinine Clearance: 92.2 mL/min (by C-G formula based on SCr of 1.16 mg/dL). Liver Function Tests: Recent Labs  Lab 04/20/24 0920 04/22/24 0508 04/23/24 0435  AST 134* 56* 39  ALT 128* 90* 70*  ALKPHOS 125 109 100  BILITOT 0.5 0.7 0.4  PROT 7.4 6.7 6.4*  ALBUMIN  3.0* 2.7* 2.6*   No results for input(s): LIPASE, AMYLASE in the last 168 hours. No results for input(s): AMMONIA in the last 168 hours. Coagulation Profile: No results for input(s): INR, PROTIME in the last 168 hours. Cardiac Enzymes: No results for input(s): CKTOTAL, CKMB, CKMBINDEX, TROPONINI in the last 168 hours. BNP (last 3 results) No results for input(s): PROBNP in the last 8760 hours. HbA1C: No results for input(s): HGBA1C in the last 72 hours. CBG: Recent Labs  Lab 04/22/24 1557 04/22/24 2109 04/23/24 0858 04/23/24 1223 04/23/24 1607  GLUCAP 129* 261* 341* 270* 146*   Lipid Profile: No results for input(s): CHOL, HDL, LDLCALC, TRIG, CHOLHDL, LDLDIRECT in the last 72 hours. Thyroid Function Tests: No results for input(s): TSH, T4TOTAL, FREET4, T3FREE, THYROIDAB in the last 72 hours. Anemia Panel: No results for input(s): VITAMINB12, FOLATE, FERRITIN, TIBC, IRON, RETICCTPCT in the last 72 hours. Sepsis Labs: Recent Labs  Lab 04/22/24 0818  PROCALCITON <0.10   No  results found for this or any previous visit (from the past 240 hours).   Radiology Studies: DG CHEST PORT 1 VIEW Result Date: 04/23/2024 CLINICAL DATA:  Short of breath EXAM: PORTABLE CHEST 1 VIEW COMPARISON:  None Available. FINDINGS: Normal mediastinum and cardiac silhouette. Normal pulmonary vasculature. No evidence of effusion, infiltrate, or pneumothorax. No acute bony abnormality. IMPRESSION: No acute cardiopulmonary process. Electronically Signed   By: Jackquline Boxer M.D.   On: 04/23/2024 09:30   Scheduled Meds:  amLODipine   10 mg Oral q morning   artificial tears  1 drop Left Eye  QID   atorvastatin   40 mg Oral QHS   brimonidine   1 drop Left Eye BID   DULoxetine   60 mg Oral q AM   enoxaparin  (LOVENOX ) injection  60 mg Subcutaneous Q24H   famotidine   20 mg Oral q AM   insulin  aspart  0-15 Units Subcutaneous TID WC   insulin  aspart  2 Units Subcutaneous TID WC   insulin  glargine-yfgn  15 Units Subcutaneous QHS   pantoprazole   40 mg Oral Daily   polyethylene glycol  17 g Oral Daily   pregabalin   100 mg Oral BID   senna-docusate  1 tablet Oral BID   sertraline   50 mg Oral q AM   tamsulosin   0.4 mg Oral QPC supper   traZODone   75 mg Oral QHS   Continuous Infusions:  sodium chloride      ampicillin -sulbactam (UNASYN ) IV 1.5 g (04/23/24 1918)    LOS: 3 days   Alejandro Marker, DO [facility] Available via Epic secure chat 7am-7pm After these hours, please refer to coverage provider listed on [email or web address] 04/23/2024, 8:14 PM

## 2024-04-23 NOTE — TOC Progression Note (Signed)
 Transition of Care [facility]) - Progression Note    Patient Details  Name: Brett White MRN: [id number] Date of Birth: 24-May-1961  [facility] CM/SW Contact  Isaiah Public, LCSWA Phone Number: 04/23/2024, 11:27 AM  Clinical Narrative:     Plan for patient to return to [facility] when medically stable for dc. CSW will continue to follow.  Expected Discharge Plan: Long Term Nursing Home Barriers to Discharge: Continued Medical Work up               Expected Discharge Plan and Services In-house Referral: Clinical Social Work   Post Acute Care Choice: Nursing Home Living arrangements for the past 2 months: Skilled Nursing Facility                                       Social Drivers of Health (SDOH) Interventions SDOH Screenings   Food Insecurity: No Food Insecurity (04/20/2024)  Housing: Low Risk  (04/20/2024)  Transportation Needs: No Transportation Needs (04/20/2024)  Utilities: Not At Risk (04/20/2024)  Tobacco Use: Low Risk  (04/11/2024)    Readmission Risk Interventions    04/21/2024    2:05 PM 04/13/2024    3:04 PM 11/16/2022    2:57 PM  Readmission Risk Prevention Plan  Transportation Screening Complete Complete Complete  Medication Review Oceanographer) Complete Complete Complete  PCP or Specialist appointment within 3-5 days of discharge Complete Complete Complete  HRI or Home Care Consult Complete Complete Complete  SW Recovery Care/Counseling Consult Complete Complete Complete  Palliative Care Screening Not Applicable Complete Complete  Skilled Nursing Facility Complete Complete Complete

## 2024-04-23 NOTE — Plan of Care (Signed)
  Problem: Nutritional: Goal: Maintenance of adequate nutrition will improve Outcome: Progressing   Problem: Clinical Measurements: Goal: Respiratory complications will improve Outcome: Progressing Goal: Cardiovascular complication will be avoided Outcome: Progressing   Problem: Pain Managment: Goal: General experience of comfort will improve and/or be controlled Outcome: Progressing   Problem: Safety: Goal: Ability to remain free from injury will improve Outcome: Progressing

## 2024-04-23 NOTE — Plan of Care (Signed)
  Problem: Education: Goal: Ability to describe self-care measures that may prevent or decrease complications (Diabetes Survival Skills Education) will improve Outcome: Progressing Goal: Individualized Educational Video(s) Outcome: Progressing   Problem: Coping: Goal: Ability to adjust to condition or change in health will improve Outcome: Progressing   Problem: Fluid Volume: Goal: Ability to maintain a balanced intake and output will improve Outcome: Progressing   Problem: Health Behavior/Discharge Planning: Goal: Ability to identify and utilize available resources and services will improve Outcome: Progressing Goal: Ability to manage health-related needs will improve Outcome: Progressing   Problem: Metabolic: Goal: Ability to maintain appropriate glucose levels will improve Outcome: Progressing   Problem: Nutritional: Goal: Maintenance of adequate nutrition will improve Outcome: Progressing Goal: Progress toward achieving an optimal weight will improve Outcome: Progressing   Problem: Skin Integrity: Goal: Risk for impaired skin integrity will decrease Outcome: Progressing   Problem: Tissue Perfusion: Goal: Adequacy of tissue perfusion will improve Outcome: Progressing   Problem: Education: Goal: Knowledge of General Education information will improve Description: Including pain rating scale, medication(s)/side effects and non-pharmacologic comfort measures Outcome: Progressing   Problem: Clinical Measurements: Goal: Ability to maintain clinical measurements within normal limits will improve Outcome: Progressing Goal: Will remain free from infection Outcome: Progressing Goal: Diagnostic test results will improve Outcome: Progressing Goal: Respiratory complications will improve Outcome: Progressing Goal: Cardiovascular complication will be avoided Outcome: Progressing   Problem: Activity: Goal: Risk for activity intolerance will decrease Outcome: Progressing    Problem: Nutrition: Goal: Adequate nutrition will be maintained Outcome: Progressing   Problem: Coping: Goal: Level of anxiety will decrease Outcome: Progressing   Problem: Elimination: Goal: Will not experience complications related to bowel motility Outcome: Progressing Goal: Will not experience complications related to urinary retention Outcome: Progressing   Problem: Pain Managment: Goal: General experience of comfort will improve and/or be controlled Outcome: Progressing   Problem: Safety: Goal: Ability to remain free from injury will improve Outcome: Progressing   Problem: Skin Integrity: Goal: Risk for impaired skin integrity will decrease Outcome: Progressing

## 2024-04-23 NOTE — Progress Notes (Signed)
 PT Cancellation Note  Patient Details Name: Brett White MRN: [id number] DOB: 11/18/1960   Cancelled Treatment:    Reason Eval/Treat Not Completed: PT screened, no needs identified, will sign off.  Pt at baseline. 04/23/2024  India HERO., PT Acute Rehabilitation Services [phone]  (office)   Vinie GAILS Kimsey Demaree 04/23/2024, 10:13 AM

## 2024-04-23 NOTE — H&P (View-Only) (Signed)
 Consultation Note   Referring Provider:  [facility] PCP: [facility]ologist:  Sampson       Reason for Consultation:  Dysphagia, abnormal barium swallow DOA: 04/20/2024         Hospital Day: 4   Attending physician's note  I personally saw the patient on 04/23/24 and performed a substantive portion of this encounter (>50% time spent), including a complete performance of at least one of the key components (MDM, Hx and/or Exam), in conjunction with the APP.  I agree with the APP's note, impression, and recommendations with additional input as follows.     63 year old male with multiple comorbidities with worsening chronic solid dysphagia Barium esophagram with esophageal dysmotility, could not pass the 13 mm barium tablet  Will plan to proceed with EGD for further evaluation, exclude peptic stricture or erosive esophagitis.  Will obtain esophageal biopsies and perform esophageal dilation as needed N.p.o. PPI twice daily   The patient was provided an opportunity to ask questions and all were answered. The patient agreed with the plan and demonstrated an understanding of the instructions.  LOIS Wilkie Mcgee , MD [phone]    ASSESSMENT    63 y.o. year old male with a medical history including but not limited to chronic pain, DM, hypertension , chronic diastolic heart failure , GERD.  Patient mated with chest pain.  He has reported dysphagia  Chronic diastolic heart failure Chest pain not felt to be cardiac related Troponins negative, no acute changes on EKG  Chronic dysphagia to solids and pills Esophageal dysmotility Abnormal Barium swallow with tablet Chronic dysphagia likely all secondary to esophageal dysmotility based on barium swallow studies but need to exclude mass/stricture   Chronic anemia with microcytosis Hemoglobin 10.2 which is near baseline.  Anemia chronic but  microcytosis evolving over the last several months.  Elevated liver enzymes, improving A few days ago AST was in the 130s /ALT 120s.  No hepatobiliary findings on RUQ US   See PMH for additional history  Principal Problem:   Acute hypoxemic respiratory failure (HCC)     PLAN:   --Schedule for EGD to be done tomorrow. The risks and benefits of EGD with possible biopsies were discussed with the patient who agrees to proceed.  -- Clear liquids for dinner this evening and n.p.o. after midnight to hopefully clear esophagus of any remaining food/debris -- Patient is on Lovenox  60 mg daily.   I have discontinued Lovenox  after tonight's dose in case esophageal dilation is warranted.  -- Continue daily pantoprazole  before breakfast.  He is also getting famotidine  in the morning.  It is often more helpful for patients to take famotidine  at bedtime.  -- Checking iron studies   HPI   63 y.o. year old male with a medical history including but not limited to chronic pain, DM, hypertension , chronic diastolic heart failure , GERD.  Patient admitted with chest pain  [place] was just discharged from the hospital on 7/20 after being admitted with acute on chronic renal failure, and acute respiratory failure felt to be secondary to acute diastolic heart failure.  Symptoms resolved with Lasix .  He was readmitted on 04/20/2024 with chest pressure.  Troponins were negative.  No acute  ST changes on EKG .  He was hypoxic, admitted with acute hypoxic respiratory failure.     Patient gives about a 1 year history of dysphagia, mainly to solids and pills.  No difficulty with liquids.  He does not feel that the dysphagia has progressed since it started but it has certainly has not improved.  He has a history of GERD, takes pantoprazole  daily.  He does report about a 10 pound weight loss over the last several weeks  Chest CT scan  IMPRESSION: 1. No acute findings. 2. Tiny bilateral pleural effusions 3. Aortic and  coronary artery atherosclerosis 4.  Pulmonary artery enlargement suggests pulmonary arterial hypertension. 5. Dilated esophagus with food/debris within. This suggests esophageal dysmotility and/or gastroesophageal reflux.   Barium swallow shows significant esophageal dysmotility with associated diffuse esophageal distention, stasis and tertiary contractions.  Barium lodged in the distal esophagus near the GE junction.  No severe stricture or obstruction was appreciated however  AST 39, ALT 70 (trending down from 120s a few days ago).  Recent RUQ ultrasound normal.      Labs and Imaging:  Recent Labs    04/22/24 0508 04/23/24 0435  PROT 6.7 6.4*  ALBUMIN  2.7* 2.6*  AST 56* 39  ALT 90* 70*  ALKPHOS 109 100  BILITOT 0.7 0.4   Recent Labs    04/22/24 0508 04/23/24 0435  WBC 6.7 5.7  HGB 10.4* 10.2*  HCT 34.0* 33.4*  MCV 79.8* 80.1  PLT 509* 480*   Recent Labs    04/22/24 0508 04/23/24 0435  NA 134* 133*  K 4.7 4.5  CL 95* 98  CO2 30 28  GLUCOSE 329* 372*  BUN 36* 31*  CREATININE 1.38* 1.16  CALCIUM  8.9 8.6*     DG CHEST PORT 1 VIEW CLINICAL DATA:  Short of breath  EXAM: PORTABLE CHEST 1 VIEW  COMPARISON:  None Available.  FINDINGS: Normal mediastinum and cardiac silhouette. Normal pulmonary vasculature. No evidence of effusion, infiltrate, or pneumothorax. No acute bony abnormality.  IMPRESSION: No acute cardiopulmonary process.  Electronically Signed   By: Jackquline Boxer M.D.   On: 04/23/2024 09:30    Pertinent GI Studies    None   Past Medical History:  Diagnosis Date   CAP (community acquired pneumonia) 12/09/2021   CKD (chronic kidney disease)    GERD (gastroesophageal reflux disease)    HTN (hypertension)    Insulin  dependent type 2 diabetes mellitus (HCC)    Neuropathy    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   Stroke [facility])     Past Surgical History:  Procedure Laterality Date   ABDOMINAL AORTOGRAM W/LOWER  EXTREMITY N/A 06/20/2023   Procedure: ABDOMINAL AORTOGRAM W/LOWER EXTREMITY;  Surgeon: Gretta Lonni PARAS, MD;  Location: [facility] IN[facility];  Service: Cardiovascular;  Laterality: N/A;   AMPUTATION Left 02/04/2021   Procedure: LEFT GREAT TOE AMPUTATION;  Surgeon: Harden Jerona GAILS, MD;  Location: [facility] OR;  Service: Orthopedics;  Laterality: Left;   AMPUTATION Left 02/10/2021   Procedure: LEFT FOOT 1ST RAY AMPUTATION;  Surgeon: Harden Jerona GAILS, MD;  Location: [facility] OR;  Service: Orthopedics;  Laterality: Left;   AMPUTATION Left 03/22/2021   Procedure: LEFT BELOW KNEE AMPUTATION;  Surgeon: Harden Jerona GAILS, MD;  Location: [facility] OR;  Service: Orthopedics;  Laterality: Left;   APPENDECTOMY     BACK SURGERY     CATARACT EXTRACTION     I & D EXTREMITY Left 03/03/2021   Procedure: LEFT FOOT DEBRIDEMENT;  Surgeon: Harden Jerona GAILS, MD;  Location: [facility] OR;  Service: Orthopedics;  Laterality: Left;   INCISION AND DRAINAGE ABSCESS N/A 10/06/2021   Procedure: INCISION AND DRAINAGE BACK ABSCESS;  Surgeon: Signe Mitzie LABOR, MD;  Location: [facility] OR;  Service: General;  Laterality: N/A;   INJECTION OF SILICONE OIL Left 07/25/2023   Procedure: INJECTION OF SILICONE OIL;  Surgeon: Valdemar Rogue, MD;  Location: [facility] OR;  Service: Ophthalmology;  Laterality: Left;   MEMBRANE PEEL Left 07/25/2023   Procedure: MEMBRANE PEEL;  Surgeon: Valdemar Rogue, MD;  Location: [facility] OR;  Service: Ophthalmology;  Laterality: Left;   PARS PLANA VITRECTOMY Left 07/25/2023   Procedure: PARS PLANA VITRECTOMY WITH 25 GAUGE;  Surgeon: Valdemar Rogue, MD;  Location: [facility] OR;  Service: Ophthalmology;  Laterality: Left;   removal of back cyst     TONSILLECTOMY      Family History  Problem Relation Age of Onset   Diabetes Mother    Heart attack Neg Hx     Prior to Admission medications   Medication Sig Start Date End Date Taking? Authorizing Provider  amLODipine  (NORVASC ) 10 MG tablet Take 10 mg by mouth every morning. 05/11/22  Yes [provider]  atorvastatin  (LIPITOR) 40 MG tablet Take 40 mg by mouth at bedtime. 11/07/21  Yes [provider]  brimonidine  (ALPHAGAN ) 0.2 % ophthalmic solution Place 1 drop into the left eye in the morning and at bedtime. 08/29/23  Yes [provider]  carboxymethylcellulose 1 % ophthalmic solution Place 4 drops into the left eye in the morning, at noon, in the evening, and at bedtime.   Yes [provider]  DULoxetine  (CYMBALTA ) 60 MG capsule Take 60 mg by mouth in the morning. 08/28/23  Yes [provider]  ergocalciferol  (VITAMIN D2) 1.25 MG (50000 UT) capsule Take 50,000 Units by mouth once a week. Fridays   Yes [provider]  famotidine  (PEPCID ) 20 MG tablet Take 20 mg by mouth in the morning. 04/20/23  Yes [provider]  FARXIGA  10 MG TABS tablet Take 10 mg by mouth every morning. Dapaglifozin 02/07/22  Yes [provider]  feeding supplement (ENSURE PLUS HIGH PROTEIN) LIQD Take 237 mLs by mouth 2 (two) times daily between meals. 04/19/24  Yes Briana Elgin LABOR, MD  furosemide  (LASIX ) 20 MG tablet Take 20 mg by mouth every morning. 05/11/22  Yes [provider]  glipiZIDE  (GLUCOTROL ) 10 MG tablet Take 10 mg by mouth every morning. 05/11/22  Yes [provider]  LINZESS  145 MCG CAPS capsule Take 145 mcg by mouth in the morning. 06/11/23  Yes [provider]  losartan  (COZAAR ) 100 MG tablet Take 0.5 tablets (50 mg total) by mouth in the morning. 03/24/24  Yes Ezenduka, Nkeiruka J, MD  Melatonin 5 MG CAPS Take 10 mg by mouth at bedtime.   Yes [provider]  pantoprazole  (PROTONIX ) 40 MG tablet Take 1 tablet (40 mg total) by mouth daily. Patient taking differently: Take 40 mg by mouth in the morning. 10/20/21  Yes Ghimire, Donalda HERO, MD  pregabalin  (LYRICA ) 100 MG capsule Take 1 capsule (100 mg total) by mouth 2 (two) times daily. 04/19/24  Yes Briana Elgin LABOR, MD  sertraline  (ZOLOFT ) 50 MG tablet Take 50 mg by  mouth in the morning. 04/30/23  Yes [provider]  tamsulosin  (FLOMAX ) 0.4 MG CAPS capsule Take 1 capsule (0.4 mg total) by mouth daily after supper. 03/24/24 04/23/24 Yes Ezenduka, Nkeiruka J, MD  traZODone  (DESYREL )  150 MG tablet Take 75 mg by mouth at bedtime. 05/30/23  Yes [provider]  TRULICITY  1.5 MG/0.5ML SOPN Inject 0.5 mg into the skin once a week. On Thursdays 04/04/23  Yes [provider]  HYDROmorphone  (DILAUDID ) 4 MG tablet Take 1 tablet (4 mg total) by mouth every 6 (six) hours as needed for moderate pain (pain score 4-6) or severe pain (pain score 7-10). Patient not taking: Reported on 04/20/2024 04/19/24   Briana Elgin LABOR, MD  polyethylene glycol (MIRALAX  / GLYCOLAX ) 17 g packet Take 17 g by mouth daily. Patient not taking: Reported on 04/20/2024 11/18/22   Krishnan, Gokul, MD    Current Facility-Administered Medications  Medication Dose Route Frequency Provider Last Rate Last Admin   amLODipine  (NORVASC ) tablet 10 mg  10 mg Oral q morning Moore, Willie, MD   10 mg at 04/23/24 9056   ampicillin -sulbactam (UNASYN ) 1.5 g in sodium chloride  0.9 % 100 mL IVPB  1.5 g Intravenous QID Brovey, Karlen, RPH 200 mL/hr at 04/23/24 1502 1.5 g at 04/23/24 1502   artificial tears ophthalmic solution 1 drop  1 drop Left Eye QID Georgina Basket, MD   1 drop at 04/23/24 1501   atorvastatin  (LIPITOR) tablet 40 mg  40 mg Oral QHS Moore, Willie, MD   40 mg at 04/22/24 2056   brimonidine  (ALPHAGAN ) 0.2 % ophthalmic solution 1 drop  1 drop Left Eye BID Georgina Basket, MD   1 drop at 04/23/24 0944   DULoxetine  (CYMBALTA ) DR capsule 60 mg  60 mg Oral q AM Georgina Basket, MD   60 mg at 04/23/24 9356   enoxaparin  (LOVENOX ) injection 60 mg  60 mg Subcutaneous Q24H Georgina Basket, MD   60 mg at 04/22/24 2057   famotidine  (PEPCID ) tablet 20 mg  20 mg Oral q AM Georgina Basket, MD   20 mg at 04/23/24 9356   HYDROmorphone  (DILAUDID ) injection 0.5 mg  0.5 mg Intravenous Q4H PRN Sheikh, Omair  Latif, DO   0.5 mg at 04/23/24 1446   insulin  aspart (novoLOG ) injection 0-15 Units  0-15 Units Subcutaneous TID WC Georgina Basket, MD   8 Units at 04/23/24 1225   insulin  aspart (novoLOG ) injection 2 Units  2 Units Subcutaneous TID WC Ezenduka, Nkeiruka J, MD   2 Units at 04/23/24 1225   insulin  glargine-yfgn (SEMGLEE ) injection 15 Units  15 Units Subcutaneous QHS Ezenduka, Nkeiruka J, MD   15 Units at 04/22/24 2057   ipratropium-albuterol  (DUONEB) 0.5-2.5 (3) MG/3ML nebulizer solution 3 mL  3 mL Nebulization Q6H PRN Ezenduka, Nkeiruka J, MD       oxyCODONE  (Oxy IR/ROXICODONE ) immediate release tablet 5-10 mg  5-10 mg Oral Q6H PRN Ezenduka, Nkeiruka J, MD   10 mg at 04/23/24 9357   pantoprazole  (PROTONIX ) EC tablet 40 mg  40 mg Oral Daily Moore, Willie, MD   40 mg at 04/23/24 0943   polyethylene glycol (MIRALAX  / GLYCOLAX ) packet 17 g  17 g Oral Daily Ezenduka, Nkeiruka J, MD   17 g at 04/22/24 9143   pregabalin  (LYRICA ) capsule 100 mg  100 mg Oral BID Georgina Basket, MD   100 mg at 04/23/24 9056   senna-docusate (Senokot-S) tablet 1 tablet  1 tablet Oral BID Ezenduka, Nkeiruka J, MD   1 tablet at 04/23/24 9056   sertraline  (ZOLOFT ) tablet 50 mg  50 mg Oral q AM Moore, Willie, MD   50 mg at 04/23/24 9356   tamsulosin  (FLOMAX ) capsule 0.4 mg  0.4 mg Oral  QPC supper Georgina Basket, MD   0.4 mg at 04/22/24 1702   traZODone  (DESYREL ) tablet 75 mg  75 mg Oral QHS Georgina Basket, MD   75 mg at 04/22/24 2056    Allergies as of 04/20/2024   (No Known Allergies)    Social History   Socioeconomic History   Marital status: Divorced    Spouse name: Not on file   Number of children: Not on file   Years of education: Not on file   Highest education level: Not on file  Occupational History   Not on file  Tobacco Use   Smoking status: Never   Smokeless tobacco: Never  Vaping Use   Vaping status: Never Used  Substance and Sexual Activity   Alcohol  use: Not Currently   Drug use: Not Currently    Sexual activity: Not Currently  Other Topics Concern   Not on file  Social History Narrative   Not on file   Social Drivers of Health   Financial Resource Strain: Not on file  Food Insecurity: No Food Insecurity (04/20/2024)   Hunger Vital Sign    Worried About Running Out of Food in the Last Year: Never true    Ran Out of Food in the Last Year: Never true  Transportation Needs: No Transportation Needs (04/20/2024)   PRAPARE - Administrator, Civil Service (Medical): No    Lack of Transportation (Non-Medical): No  Physical Activity: Not on file  Stress: Not on file  Social Connections: Not on file  Intimate Partner Violence: Not At Risk (04/20/2024)   Humiliation, Afraid, Rape, and Kick questionnaire    Fear of Current or Ex-Partner: No    Emotionally Abused: No    Physically Abused: No    Sexually Abused: No     Code Status   Code Status: Full Code  Review of Systems: All systems reviewed and negative except where noted in HPI.  Physical Exam: Vital signs in last 24 hours: Temp:  [97.6 F (36.4 C)-98.1 F (36.7 C)] 97.9 F (36.6 C) (07/24 1603) Pulse Rate:  [78-98] 94 (07/24 1603) Resp:  [11-19] 17 (07/24 1603) BP: (115-141)/(61-94) 141/94 (07/24 1603) SpO2:  [94 %-100 %] 99 % (07/24 1324) Last BM Date : 04/22/24  General:  Pleasant male in NAD Psych:  Cooperative. Normal mood and affect Eyes: Pupils equal Ears:  Normal auditory acuity Nose: No deformity, discharge or lesions Neck:  Supple, no masses felt Lungs:  Clear to auscultation.  Heart:  Regular rate, regular rhythm.  Abdomen:  Soft, nondistended, nontender, active bowel sounds, no masses felt Rectal :  Deferred Msk: Symmetrical without gross deformities.  Neurologic:  Alert, oriented, grossly normal neurologically Skin:  Intact without significant lesions.    Intake/Output from previous day: 07/23 0701 - 07/24 0700 In: 780 [P.O.:780] Out: 1300 [Urine:1300] Intake/Output this  shift:  Total I/O In: 480 [P.O.:480] Out: -    Vina Dasen, NP-C   04/23/2024, 4:20 PM

## 2024-04-23 NOTE — Evaluation (Signed)
 Occupational Therapy Evaluation Patient Details Name: Brett White MRN: [id number] DOB: [birth year] Today's Date: 04/23/2024   History of Present Illness   The pt is a 63yo male presenting from [facility] 7/21 with L sided chest pain. Negative for PE, concerns for pneumonia. PMH includes: chronic pain, L BKA, DM II, HTN, CKD II, and stroke.     Clinical Impressions Pt admitted based on above, and was seen based on problem list below. PTA pt was living at [facility] and was mod I for ADLs using his prosthetic or w/c for functional mobility. Today pt is at his functional baseline for ADLs and mobility. Pt reporting no concerns or decreased activity tolerance, just L sided pain in lungs. Pt sustaining O2 levels on 3L with mobility. No further acute or follow up OT needs. Pt can d/c back to [facility] when medically cleared.        If plan is discharge home, recommend the following:   Assistance with cooking/housework     Functional Status Assessment   Patient has not had a recent decline in their functional status     Equipment Recommendations   None recommended by OT      Precautions/Restrictions   Precautions Precautions: Fall Recall of Precautions/Restrictions: Intact Restrictions Weight Bearing Restrictions Per Provider Order: No     Mobility Bed Mobility Overal bed mobility: Independent      Transfers Overall transfer level: Modified independent Equipment used: Rolling walker (2 wheels)     General transfer comment: Mod I for short hop ~41ft with RW, pt without prosthetic here at the hospital      Balance Overall balance assessment: Mild deficits observed, not formally tested       ADL either performed or assessed with clinical judgement   ADL Overall ADL's : At baseline;Modified independent     General ADL Comments: pt able to maintain standing balance, reach outside of base of support, complete LB dressing and transfer with RW      Vision Baseline Vision/History: 6 Macular Degeneration Ability to See in Adequate Light: 0 Adequate Patient Visual Report: No change from baseline Vision Assessment?: No apparent visual deficits            Pertinent Vitals/Pain Pain Assessment Pain Assessment: Faces Faces Pain Scale: Hurts even more Pain Location: L sided chest Pain Descriptors / Indicators: Discomfort Pain Intervention(s): Monitored during session     Extremity/Trunk Assessment Upper Extremity Assessment Upper Extremity Assessment: Generalized weakness   Lower Extremity Assessment Lower Extremity Assessment: LLE deficits/detail LLE Deficits / Details: BKA   Cervical / Trunk Assessment Cervical / Trunk Assessment: Normal   Communication Communication Communication: No apparent difficulties   Cognition Arousal: Alert Behavior During Therapy: WFL for tasks assessed/performed Cognition: No apparent impairments       Following commands: Intact       Cueing  General Comments   Cueing Techniques: Verbal cues  O2 levels stable on 3L O2           Home Living Family/patient expects to be discharged to:: Skilled nursing facility   Additional Comments: LTC at [facility]      Prior Functioning/Environment Prior Level of Function : Independent/Modified Independent       Mobility Comments: Wears L proshtesis, transfer to w/c or uses prosthetic + RW about 11ft ADLs Comments: ind for ADLs, facility manages meals and meds    OT Problem List: Cardiopulmonary status limiting activity        OT Goals(Current goals can be found  in the care plan section)   Acute Rehab OT Goals Patient Stated Goal: To get better OT Goal Formulation: With patient Time For Goal Achievement: 05/07/24 Potential to Achieve Goals: Good   AM-PAC OT 6 Clicks Daily Activity     Outcome Measure Help from another person eating meals?: None Help from another person taking care of personal grooming?: None Help  from another person toileting, which includes using toliet, bedpan, or urinal?: None Help from another person bathing (including washing, rinsing, drying)?: None Help from another person to put on and taking off regular upper body clothing?: None Help from another person to put on and taking off regular lower body clothing?: None 6 Click Score: 24   End of Session Equipment Utilized During Treatment: Gait belt;Rolling walker (2 wheels) Nurse Communication: Mobility status  Activity Tolerance: Patient tolerated treatment well Patient left: in chair;with call bell/phone within reach;with chair alarm set  OT Visit Diagnosis: Other (comment) (Cardiopulmonary status)                Time: 9063-8992 OT Time Calculation (min): 31 min Charges:  OT General Charges $OT Visit: 1 Visit OT Evaluation $OT Eval Moderate Complexity: 1 Mod OT Treatments $Self Care/Home Management : 8-22 mins  Adrianne BROCKS, OT  Acute Rehabilitation Services Office [phone] Secure chat preferred   Adrianne GORMAN Savers 04/23/2024, 10:33 AM

## 2024-04-24 ENCOUNTER — Inpatient Hospital Stay (HOSPITAL_COMMUNITY): Admitting: Certified Registered Nurse Anesthetist

## 2024-04-24 ENCOUNTER — Encounter (HOSPITAL_COMMUNITY)
Admission: EM | Disposition: A | Discharge status: Skilled Nursing Facility | Payer: Self-pay | Source: Home / Self Care | Attending: Internal Medicine

## 2024-04-24 ENCOUNTER — Encounter (HOSPITAL_COMMUNITY): Payer: Self-pay | Admitting: Hospitalist

## 2024-04-24 DIAGNOSIS — K224 Dyskinesia of esophagus: Secondary | ICD-10-CM | POA: Diagnosis not present

## 2024-04-24 DIAGNOSIS — J690 Pneumonitis due to inhalation of food and vomit: Secondary | ICD-10-CM | POA: Diagnosis not present

## 2024-04-24 DIAGNOSIS — E119 Type 2 diabetes mellitus without complications: Secondary | ICD-10-CM

## 2024-04-24 DIAGNOSIS — I10 Essential (primary) hypertension: Secondary | ICD-10-CM

## 2024-04-24 DIAGNOSIS — F419 Anxiety disorder, unspecified: Secondary | ICD-10-CM | POA: Diagnosis not present

## 2024-04-24 DIAGNOSIS — K222 Esophageal obstruction: Secondary | ICD-10-CM

## 2024-04-24 DIAGNOSIS — J9601 Acute respiratory failure with hypoxia: Secondary | ICD-10-CM | POA: Diagnosis not present

## 2024-04-24 LAB — CBC WITH DIFFERENTIAL/PLATELET
Abs Immature Granulocytes: 0.05 K/uL (ref 0.00–0.07)
Basophils Absolute: 0 K/uL (ref 0.0–0.1)
Basophils Relative: 1 %
Eosinophils Absolute: 0.2 K/uL (ref 0.0–0.5)
Eosinophils Relative: 4 %
HCT: 33.1 % — ABNORMAL LOW (ref 39.0–52.0)
Hemoglobin: 10.1 g/dL — ABNORMAL LOW (ref 13.0–17.0)
Immature Granulocytes: 1 %
Lymphocytes Relative: 25 %
Lymphs Abs: 1.5 K/uL (ref 0.7–4.0)
MCH: 24.3 pg — ABNORMAL LOW (ref 26.0–34.0)
MCHC: 30.5 g/dL (ref 30.0–36.0)
MCV: 79.8 fL — ABNORMAL LOW (ref 80.0–100.0)
Monocytes Absolute: 0.4 K/uL (ref 0.1–1.0)
Monocytes Relative: 7 %
Neutro Abs: 3.7 K/uL (ref 1.7–7.7)
Neutrophils Relative %: 62 %
Platelets: 468 K/uL — ABNORMAL HIGH (ref 150–400)
RBC: 4.15 MIL/uL — ABNORMAL LOW (ref 4.22–5.81)
RDW: 18.1 % — ABNORMAL HIGH (ref 11.5–15.5)
WBC: 5.9 K/uL (ref 4.0–10.5)
nRBC: 0 % (ref 0.0–0.2)

## 2024-04-24 LAB — COMPREHENSIVE METABOLIC PANEL WITH GFR
ALT: 56 U/L — ABNORMAL HIGH (ref 0–44)
AST: 32 U/L (ref 15–41)
Albumin: 2.7 g/dL — ABNORMAL LOW (ref 3.5–5.0)
Alkaline Phosphatase: 102 U/L (ref 38–126)
Anion gap: 8 (ref 5–15)
BUN: 22 mg/dL — ABNORMAL HIGH (ref 6–20)
CO2: 28 mmol/L (ref 22–32)
Calcium: 8.6 mg/dL — ABNORMAL LOW (ref 8.9–10.3)
Chloride: 101 mmol/L (ref 98–111)
Creatinine, Ser: 1.06 mg/dL (ref 0.61–1.24)
GFR, Estimated: >60 mL/min (ref >60)
Glucose, Bld: 186 mg/dL — ABNORMAL HIGH (ref 70–99)
Potassium: 4.6 mmol/L (ref 3.5–5.1)
Sodium: 137 mmol/L (ref 135–145)
Total Bilirubin: 0.5 mg/dL (ref 0.0–1.2)
Total Protein: 6.4 g/dL — ABNORMAL LOW (ref 6.5–8.1)

## 2024-04-24 LAB — GLUCOSE, CAPILLARY
Glucose-Capillary: 181 mg/dL — ABNORMAL HIGH (ref 70–99)
Glucose-Capillary: 185 mg/dL — ABNORMAL HIGH (ref 70–99)
Glucose-Capillary: 189 mg/dL — ABNORMAL HIGH (ref 70–99)
Glucose-Capillary: 217 mg/dL — ABNORMAL HIGH (ref 70–99)
Glucose-Capillary: 264 mg/dL — ABNORMAL HIGH (ref 70–99)
Glucose-Capillary: 301 mg/dL — ABNORMAL HIGH (ref 70–99)

## 2024-04-24 LAB — PHOSPHORUS: Phosphorus: 3.1 mg/dL (ref 2.5–4.6)

## 2024-04-24 LAB — MAGNESIUM: Magnesium: 2 mg/dL (ref 1.7–2.4)

## 2024-04-24 SURGERY — EGD (ESOPHAGOGASTRODUODENOSCOPY)
Anesthesia: Monitor Anesthesia Care

## 2024-04-24 MED ORDER — HYDROMORPHONE HCL 1 MG/ML IJ SOLN
0.5000 mg | Freq: Once | INTRAMUSCULAR | Status: AC
  Administered 2024-04-24: 0.5 mg via INTRAVENOUS
  Filled 2024-04-24: qty 1

## 2024-04-24 MED ORDER — PANTOPRAZOLE SODIUM 40 MG PO TBEC
40.0000 mg | DELAYED_RELEASE_TABLET | Freq: Two times a day (BID) | ORAL | Status: DC
  Administered 2024-04-24 – 2024-04-27 (×7): 40 mg via ORAL
  Filled 2024-04-24 (×7): qty 1

## 2024-04-24 MED ORDER — INSULIN ASPART 100 UNIT/ML IJ SOLN: 0.0000 [IU] | Freq: Three times a day (TID) | INTRAMUSCULAR | Status: DC

## 2024-04-24 MED ORDER — PROPOFOL 10 MG/ML IV BOLUS
INTRAVENOUS | Status: DC | PRN
  Administered 2024-04-24: 30 mg via INTRAVENOUS

## 2024-04-24 MED ORDER — FAMOTIDINE 20 MG PO TABS
20.0000 mg | ORAL_TABLET | Freq: Every day | ORAL | Status: DC
  Administered 2024-04-25 – 2024-04-27 (×3): 20 mg via ORAL
  Filled 2024-04-24 (×3): qty 1

## 2024-04-24 MED ORDER — ACETAMINOPHEN 325 MG PO TABS
650.0000 mg | ORAL_TABLET | Freq: Four times a day (QID) | ORAL | Status: DC | PRN
  Administered 2024-04-24: 650 mg via ORAL
  Filled 2024-04-24: qty 2

## 2024-04-24 MED ORDER — LIDOCAINE 2% (20 MG/ML) 5 ML SYRINGE
INTRAMUSCULAR | Status: DC | PRN
  Administered 2024-04-24: 50 mg via INTRAVENOUS

## 2024-04-24 MED ORDER — INSULIN ASPART 100 UNIT/ML IJ SOLN
0.0000 [IU] | Freq: Every day | INTRAMUSCULAR | Status: DC
  Administered 2024-04-24: 4 [IU] via SUBCUTANEOUS
  Administered 2024-04-25 – 2024-04-27 (×3): 3 [IU] via SUBCUTANEOUS

## 2024-04-24 MED ORDER — PROPOFOL 500 MG/50ML IV EMUL
INTRAVENOUS | Status: DC | PRN
  Administered 2024-04-24: 125 ug/kg/min via INTRAVENOUS

## 2024-04-24 NOTE — Plan of Care (Signed)
  Problem: Clinical Measurements: Goal: Respiratory complications will improve Outcome: Progressing Goal: Cardiovascular complication will be avoided Outcome: Progressing   Problem: Pain Managment: Goal: General experience of comfort will improve and/or be controlled Outcome: Progressing   Problem: Safety: Goal: Ability to remain free from injury will improve Outcome: Progressing

## 2024-04-24 NOTE — Op Note (Signed)
 [facility] Patient Name: Brett White Procedure Date : 04/24/2024 MRN: [id number] Attending MD: Gustav ALONSO Mcgee , MD, [id number] Date of Birth: 1960/12/28 CSN: [id number] Age: 63 Admit Type: Inpatient Procedure:                Upper GI endoscopy Indications:              Dysphagia Providers:                Gustav ALONSO Mcgee, MD, Ozell Pouch, Felice Sar, Technician Referring MD:              Medicines:                Monitored Anesthesia Care Complications:            No immediate complications. Estimated Blood Loss:     Estimated blood loss was minimal. Procedure:                Pre-Anesthesia Assessment:                           - Prior to the procedure, a History and Physical                            was performed, and patient medications and                            allergies were reviewed. The patient's tolerance of                            previous anesthesia was also reviewed. The risks                            and benefits of the procedure and the sedation                            options and risks were discussed with the patient.                            All questions were answered, and informed consent                            was obtained. Prior Anticoagulants: The patient has                            taken no anticoagulant or antiplatelet agents. ASA                            Grade Assessment: III - A patient with severe                            systemic disease. After reviewing the risks and  benefits, the patient was deemed in satisfactory                            condition to undergo the procedure.                           After obtaining informed consent, the endoscope was                            passed under direct vision. Throughout the                            procedure, the patient's blood pressure, pulse, and                            oxygen saturations  were monitored continuously. The                            [id number]-H190 ([id number]) Olympus endoscope was introduced                            through the mouth, and advanced to the second part                            of duodenum. The upper GI endoscopy was                            accomplished without difficulty. The patient                            tolerated the procedure well. Scope In: Scope Out: Findings:      The Z-line was irregular and was found 38 cm from the incisors.      LA Grade B (one or more mucosal breaks greater than 5 mm, not extending       between the tops of two mucosal folds) esophagitis with no bleeding was       found 30 to 38 cm from the incisors. Biopsies were obtained from the       proximal and distal esophagus with cold forceps for histology of       suspected eosinophilic esophagitis.      No endoscopic abnormality was evident in the esophagus to explain the       patient's complaint of dysphagia. It was decided, however, to proceed       with dilation of the lower third of the esophagus. A TTS dilator was       passed through the scope. Dilation with an 18-19-20 mm x 8 cm CRE       balloon dilator was performed to 20 mm. The dilation site was examined       following endoscope reinsertion and showed no change.      A 2 cm hiatal hernia was present.      The stomach was normal.      The cardia and gastric fundus were normal on retroflexion.      The examined duodenum was normal. Impression:               -  Z-line irregular, 38 cm from the incisors.                           - LA Grade B reflux esophagitis with no bleeding.                           - No endoscopic esophageal abnormality to explain                            patient's dysphagia. Esophagus dilated. Dilated.                           - 2 cm hiatal hernia.                           - Normal stomach.                           - Normal examined duodenum.                           - Biopsies  were taken with a cold forceps for                            evaluation of eosinophilic esophagitis. Recommendation:           - Patient has a contact number available for                            emergencies. The signs and symptoms of potential                            delayed complications were discussed with the                            patient. Return to normal activities tomorrow.                            Written discharge instructions were provided to the                            patient.                           - Resume previous diet.                           - Continue present medications.                           - Await pathology results.                           - Follow an antireflux regimen.                           - Use Protonix  (pantoprazole ) 40 mg PO BID.                           -  GI signing off, please call with any questions Procedure Code(s):        --- Professional ---                           [phone], Esophagogastroduodenoscopy, flexible,                            transoral; with transendoscopic balloon dilation of                            esophagus (less than 30 mm diameter)                           43239, 59, Esophagogastroduodenoscopy, flexible,                            transoral; with biopsy, single or multiple Diagnosis Code(s):        --- Professional ---                           K22.89, Other specified disease of esophagus                           K21.00, Gastro-esophageal reflux disease with                            esophagitis, without bleeding                           R13.10, Dysphagia, unspecified                           K44.9, Diaphragmatic hernia without obstruction or                            gangrene CPT copyright 2022 American Medical Association. All rights reserved. The codes documented in this report are preliminary and upon coder review may  be revised to meet current compliance requirements. Kynan Peasley V. Yago Ludvigsen,  MD 04/24/2024 12:50:18 PM This report has been signed electronically. Number of Addenda: 0

## 2024-04-24 NOTE — TOC Progression Note (Signed)
 Transition of Care [facility]) - Progression Note    Patient Details  Name: Brett White MRN: [id number] Date of Birth: Sep 23, 1961  [facility] of [facility] CM/SW Contact  Isaiah Public, LCSWA Phone Number: 04/24/2024, 10:24 AM  Clinical Narrative:     CSW spoke with Glenys with [facility] who informed CSW that facility can accept patient back when patient is medically stable. If patient is medically stable over the weekend Tanya request for weekend CSW to follow up with facility to check status on bed availability. CSW will continue to follow and assist with patients dc planning needs.  Expected Discharge Plan: Long Term Nursing Home Barriers to Discharge: Continued Medical Work up               Expected Discharge Plan and Services In-house Referral: Clinical Social Work   Post Acute Care Choice: Nursing Home Living arrangements for the past 2 months: Skilled Nursing Facility                                       Social Drivers of Health (SDOH) Interventions SDOH Screenings   Food Insecurity: No Food Insecurity (04/20/2024)  Housing: Low Risk  (04/20/2024)  Transportation Needs: No Transportation Needs (04/20/2024)  Utilities: Not At Risk (04/20/2024)  Tobacco Use: Low Risk  (04/11/2024)    Readmission Risk Interventions    04/21/2024    2:05 PM 04/13/2024    3:04 PM 11/16/2022    2:57 PM  Readmission Risk Prevention Plan  Transportation Screening Complete Complete Complete  Medication Review Oceanographer) Complete Complete Complete  PCP or Specialist appointment within 3-5 days of discharge Complete Complete Complete  HRI or Home Care Consult Complete Complete Complete  SW Recovery Care/Counseling Consult Complete Complete Complete  Palliative Care Screening Not Applicable Complete Complete  Skilled Nursing Facility Complete Complete Complete

## 2024-04-24 NOTE — Interval H&P Note (Signed)
 History and Physical Interval Note:  04/24/2024 12:00 PM  Brett White  has presented today for surgery, with the diagnosis of Dysphagia and abnormal barium swallow.  The various methods of treatment have been discussed with the patient and family. After consideration of risks, benefits and other options for treatment, the patient has consented to  Procedure(s): EGD (ESOPHAGOGASTRODUODENOSCOPY) (N/A) as a surgical intervention.  The patient's history has been reviewed, patient examined, no change in status, stable for surgery.  I have reviewed the patient's chart and labs.  Questions were answered to the patient's satisfaction.     [name]

## 2024-04-24 NOTE — Anesthesia Preprocedure Evaluation (Signed)
 Anesthesia Evaluation  Patient identified by MRN, date of birth, ID band Patient awake    Reviewed: Allergy & Precautions, H&P , NPO status , Patient's Chart, lab work & pertinent test results  Airway Mallampati: II   Neck ROM: full    Dental   Pulmonary neg pulmonary ROS   breath sounds clear to auscultation       Cardiovascular hypertension,  Rhythm:regular Rate:Normal     Neuro/Psych  PSYCHIATRIC DISORDERS Anxiety      Neuromuscular disease CVA    GI/Hepatic ,GERD  ,,  Endo/Other  diabetes, Type 2    Renal/GU Renal InsufficiencyRenal disease     Musculoskeletal   Abdominal   Peds  Hematology   Anesthesia Other Findings   Reproductive/Obstetrics                              Anesthesia Physical Anesthesia Plan  ASA: 3  Anesthesia Plan: MAC   Post-op Pain Management:    Induction: Intravenous  PONV Risk Score and Plan: 1 and Propofol  infusion and Treatment may vary due to age or medical condition  Airway Management Planned: Nasal Cannula  Additional Equipment:   Intra-op Plan:   Post-operative Plan:   Informed Consent: I have reviewed the patients History and Physical, chart, labs and discussed the procedure including the risks, benefits and alternatives for the proposed anesthesia with the patient or authorized representative who has indicated his/her understanding and acceptance.     Dental advisory given  Plan Discussed with: CRNA, Anesthesiologist and Surgeon  Anesthesia Plan Comments:         Anesthesia Quick Evaluation

## 2024-04-24 NOTE — Transfer of Care (Signed)
 Immediate Anesthesia Transfer of Care Note  Patient: Brett White  Procedure(s) Performed: EGD (ESOPHAGOGASTRODUODENOSCOPY) Balloon dilation wire-guided  Patient Location: Endoscopy Unit  Anesthesia Type:MAC  Level of Consciousness: drowsy and patient cooperative  Airway & Oxygen Therapy: Patient Spontanous Breathing and Patient connected to nasal cannula oxygen  Post-op Assessment: Report given to RN and Post -op Vital signs reviewed and stable  Post vital signs: Reviewed and stable  Last Vitals:  Vitals Value Taken Time  BP 104/60 04/24/24 12:36  Temp    Pulse 83 04/24/24 12:38  Resp 9 04/24/24 12:38  SpO2 96 % 04/24/24 12:38  Vitals shown include unfiled device data.  Last Pain:  Vitals:   04/24/24 1139  TempSrc: Temporal  PainSc: 9       Patients Stated Pain Goal: 0 (04/22/24 1932)  Complications: No notable events documented.

## 2024-04-24 NOTE — Progress Notes (Signed)
 PROGRESS NOTE    Brett White  FMW:[id number] DOB: [birth year] DOA: 04/20/2024 PCP: [facility]   Brief Narrative:  Colbin Jovel is a 63 y.o. 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  Patient is from [facility], facility stated patient was around 78% on room air.  Patient he is not on oxygen. In the ED, pt tachycardic. Labs notable for glucose 368. CXR w/o focal consolidations. CT chest w/ bilateral pleural effusions.  Of note, patient has multiple frequent hospitalization and concern for significant drug-seeking behavior.  Patient admitted for further management.    On 04/21/24 patient reported that his pain is all over his body, denies any specific chest pain or abdominal pain.  Laying in bed, appearing comfortable.  Currently on 2 L of oxygen, saturating well  Esophagram showed severe esophageal dysmotility and SLP employing strategies to implement given his reduced clearance.  Consulted GI for further evaluation and plan is for EGD 04/24/24.   Assessment and Plan:  Chest pain and this could be likely in the setting of esophageal dysmotility: Denies any current left-sided chest pain. Troponins negative. EKG with sinus tachycardia, no acute ST changes. Last echo done on 04/15/24, showed EF of 55 to 60%, no regional wall motion abnormality, left ventricular diastolic parameters are indeterminate. CTM on Telemetry    Acute Hypoxic Respiratory Failure in the setting of Likely aspiration event/pneumonia from Esophageal Dysmotility and Unlikely acute HFpEF exacerbation -Oxygen sats dropped to 78% on room air with recovery, possibly an aspiration event (if accurate) -Currently afebrile, with no leukocytosis -BNP 28, trended downwards from 163  -CXR w/o focal consolidations -CT chest with b/l tiny pleural effusions, pulmonary artery hypertension, noted dilated esophagus with food/debris's within, suggest  possibly esophageal dysmotility/GERD; see below -Discontinue IV Lasix  for now -Esophagram done and even thought it was a limited study it showed Significant esophageal dysmotility with associated diffuse esophageal distension, stasis, and tertiary contractions. The Barium tablet lodged in distal esophagus near GE junction but no underlying severe stricture or obstruction appreciated fluoroscopically.  -Consulted GI because of this and they are planning on EGD on 04/24/24 and patient now going to Endoscopy .  -SLP evaluation being done and gave him precautions.  -Cover with IV Unasyn , may switch to p.o. Augmentin  to complete 5 days -Continue DuoNebs -Supplemental O2 as needed, plan to wean off; Repeat CXR in the AM and will need   Esophageal Dysmotolity and Dysphagia: see Above   Transaminitis/ Abnormal LFTs, improving -Alkaline phosphatase and T. bili WNL. Unknown etiology but improving as AST went from 134 at its peak and is now 32 and ALT went from 128 at its peak and is now  56 -Right upper quadrant ultrasound showed normal hepatobiliary; Hepatitis panel Negative  -CTM and Trend intermittently    Chronic Diastolic HF, ruled out Acute Exacerbation: Appears euvolemic. 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 SSI, glargine, Accu-Cheks, hypoglycemic protocol On Farxiga , glipizide , Trulicity . CTM CBGs per Protocol. CBG Trend ranging from 129-341  Renal Insuffiencey: BUN/Cr Trend improved: Recent Labs  Lab 04/16/24 0500 04/17/24 0352 04/18/24 0324 04/20/24 0920 04/22/24 0508 04/23/24 0435 04/24/24 0540  BUN 30* 33* 27* 22* 36* 31* 22*  CREATININE 1.48* 1.20 0.96 1.13 1.38* 1.16 1.06  -Avoid Nephrotoxic Medications, Contrast Dyes, Hypotension and Dehydration to Ensure Adequate Renal Perfusion and will need to Renally Adjust Meds -Continue to Monitor  and Trend Renal Function carefully and repeat CMP in the AM   Chronic pain: PTA Lyrica  100mg  BID,  reduced dose Dilaudid  2mg  q6h (4mg  q6h pta), and duloxetine  60mg  daily. Resume IV diluadid for now and c/w other regimen per [facility]   Essential HTN: C/w PTA amlodipine  10mg  daily, hold losartan  for now. CTM BP per Protocol. Last BP reading was 115/61.     GERD/GI Prophylaxis: C/w PTA PPI  Ascending aorta dilation: Noted on recent Transthoracic Echocardiogram measuring 42 mm, in addition to dilation of the aortic root measuring 43 mm.   Hypoalbuminemia: Patient's Albumin  Trend ranging from 2.3-3.4. (2.7 today) CTM and Trend and repeat CMP in the AM  Class I Obesity: Complicates overall prognosis and care. Estimated body mass index is 33.26 kg/m as calculated from the following:   Height as of this encounter: 6' 2 (1.88 m).   Weight as of this encounter: 117.5 kg. Weight Loss and Dietary Counseling given   DVT prophylaxis: SCDs    Code Status: Full Code Family Communication: No family present @ bedside  Disposition Plan:  Level of care: Telemetry Cardiac Status is: Inpatient Remains inpatient appropriate because: Needs further clinical improvement and evaluation by GI as he is undergoing EGD today   Consultants:  Gastroenterology  Procedures:  EGD to be performed today  Antimicrobials:  Anti-infectives (From admission, onward)    Start     Dose/Rate Route Frequency Ordered Stop   04/21/24 1400  [MAR Hold]  ampicillin -sulbactam (UNASYN ) 1.5 g in sodium chloride  0.9 % 100 mL IVPB        (MAR Hold since Fri 04/24/2024 at 1141.Hold Reason: Transfer to a Procedural area)   1.5 g 200 mL/hr over 30 Minutes Intravenous 4 times daily 04/21/24 1122         Subjective: Seen and examined at bedside thinks that his pills are stuck in his esophagus now.  No nausea or vomiting.  Feels okay.  Had a blister on his arm that he burst denies any other concerns or complaints at this time.  States that he is hungry though.  Objective: Vitals:   04/24/24 0635 04/24/24 0818 04/24/24 0927 04/24/24  1139  BP:  (!) 153/88 (!) 153/88 (!) 150/85  Pulse: 80 80  91  Resp: 15 12  (!) 26  Temp:  97.9 F (36.6 C)  (!) 97.5 F (36.4 C)  TempSrc:  Oral  Temporal  SpO2: 96% 92%  93%  Weight:      Height:        Intake/Output Summary (Last 24 hours) at 04/24/2024 1231 Last data filed at 04/24/2024 0930 Gross per 24 hour  Intake 600 ml  Output 1875 ml  Net -1275 ml   Filed Weights   04/20/24 0917 04/20/24 1819  Weight: 127.2 kg 117.5 kg   Examination: Physical Exam:  Constitutional: WN/WD, obese chronically ill-appearing Caucasian male in no acute distress Respiratory: Diminished to auscultation bilaterally with some coarse breath sounds, no wheezing, rales, rhonchi or crackles. Normal respiratory effort and patient is not tachypenic. No accessory muscle use and was not wearing nasal cannula today and saturating the mid 90s.  Cardiovascular: RRR, no murmurs / rubs / gallops. S1 and S2 auscultated.  + Mild lower extremity edema on the right and has a left BKA Abdomen: Soft, non-tender, distended secondary to body habitus bowel sounds positive.  GU: Deferred. Musculoskeletal: No clubbing / cyanosis of digits/nails. No joint deformity upper and lower extremities. Skin: No rashes, lesions, ulcers on limited  skin evaluation. No induration; Warm and dry.  Neurologic: CN 2-12 grossly intact with no focal deficits. Romberg sign and cerebellar reflexes not assessed.  Psychiatric: Normal judgment and insight. Alert and oriented x 3. Normal mood and appropriate affect.   Data Reviewed: I have personally reviewed following labs and imaging studies  CBC: Recent Labs  Lab 04/20/24 0920 04/22/24 0508 04/23/24 0435 04/24/24 0540  WBC 7.1 6.7 5.7 5.9  NEUTROABS  --  4.2 3.1 3.7  HGB 11.5* 10.4* 10.2* 10.1*  HCT 37.3* 34.0* 33.4* 33.1*  MCV 78.9* 79.8* 80.1 79.8*  PLT 542* 509* 480* 468*   Basic Metabolic Panel: Recent Labs  Lab 04/18/24 0324 04/19/24 0326 04/20/24 0920 04/22/24 0508  04/23/24 0435 04/24/24 0540  NA 138  --  135 134* 133* 137  K 4.8  --  4.9 4.7 4.5 4.6  CL 98  --  95* 95* 98 101  CO2 31  --  27 30 28 28   GLUCOSE 196*  --  280* 329* 372* 186*  BUN 27*  --  22* 36* 31* 22*  CREATININE 0.96  --  1.13 1.38* 1.16 1.06  CALCIUM  8.6*  --  9.3 8.9 8.6* 8.6*  MG 1.7 1.9  --   --  2.0 2.0  PHOS  --   --   --   --  4.0 3.1   GFR: Estimated Creatinine Clearance: 100.9 mL/min (by C-G formula based on SCr of 1.06 mg/dL). Liver Function Tests: Recent Labs  Lab 04/20/24 0920 04/22/24 0508 04/23/24 0435 04/24/24 0540  AST 134* 56* 39 32  ALT 128* 90* 70* 56*  ALKPHOS 125 109 100 102  BILITOT 0.5 0.7 0.4 0.5  PROT 7.4 6.7 6.4* 6.4*  ALBUMIN  3.0* 2.7* 2.6* 2.7*   No results for input(s): LIPASE, AMYLASE in the last 168 hours. No results for input(s): AMMONIA in the last 168 hours. Coagulation Profile: No results for input(s): INR, PROTIME in the last 168 hours. Cardiac Enzymes: No results for input(s): CKTOTAL, CKMB, CKMBINDEX, TROPONINI in the last 168 hours. BNP (last 3 results) No results for input(s): PROBNP in the last 8760 hours. HbA1C: No results for input(s): HGBA1C in the last 72 hours. CBG: Recent Labs  Lab 04/23/24 0858 04/23/24 1223 04/23/24 1607 04/24/24 0006 04/24/24 0816  GLUCAP 341* 270* 146* 181* 185*   Lipid Profile: No results for input(s): CHOL, HDL, LDLCALC, TRIG, CHOLHDL, LDLDIRECT in the last 72 hours. Thyroid Function Tests: No results for input(s): TSH, T4TOTAL, FREET4, T3FREE, THYROIDAB in the last 72 hours. Anemia Panel: No results for input(s): VITAMINB12, FOLATE, FERRITIN, TIBC, IRON, RETICCTPCT in the last 72 hours. Sepsis Labs: Recent Labs  Lab 04/22/24 0818  PROCALCITON <0.10   No results found for this or any previous visit (from the past 240 hours).   Radiology Studies: DG CHEST PORT 1 VIEW Result Date: 04/23/2024 CLINICAL DATA:  Short of  breath EXAM: PORTABLE CHEST 1 VIEW COMPARISON:  None Available. FINDINGS: Normal mediastinum and cardiac silhouette. Normal pulmonary vasculature. No evidence of effusion, infiltrate, or pneumothorax. No acute bony abnormality. IMPRESSION: No acute cardiopulmonary process. Electronically Signed   By: Jackquline Boxer M.D.   On: 04/23/2024 09:30   Scheduled Meds:  [MAR Hold] amLODipine   10 mg Oral q morning   [MAR Hold] artificial tears  1 drop Left Eye QID   [MAR Hold] atorvastatin   40 mg Oral QHS   [MAR Hold] brimonidine   1 drop Left Eye BID   [MAR Hold] DULoxetine   60 mg Oral q AM   [MAR Hold] famotidine   20 mg Oral q AM   [MAR Hold] insulin  aspart  0-15 Units Subcutaneous TID WC   [MAR Hold] insulin  aspart  2 Units Subcutaneous TID WC   [MAR Hold] insulin  glargine-yfgn  15 Units Subcutaneous QHS   [MAR Hold] pantoprazole   40 mg Oral Daily   [MAR Hold] polyethylene glycol  17 g Oral Daily   [MAR Hold] pregabalin   100 mg Oral BID   [MAR Hold] senna-docusate  1 tablet Oral BID   [MAR Hold] sertraline   50 mg Oral q AM   [MAR Hold] tamsulosin   0.4 mg Oral QPC supper   [MAR Hold] traZODone   75 mg Oral QHS   Continuous Infusions:  sodium chloride  20 mL/hr at 04/24/24 1001   [MAR Hold] ampicillin -sulbactam (UNASYN ) IV 1.5 g (04/24/24 1003)    LOS: 4 days   Alejandro Marker, DO [facility] Available via Epic secure chat 7am-7pm After these hours, please refer to coverage provider listed on [email or web address] 04/24/2024, 12:31 PM

## 2024-04-24 NOTE — Anesthesia Procedure Notes (Signed)
 Procedure Name: MAC Date/Time: 04/24/2024 12:14 PM  Performed by: Cindie Donald CROME, CRNAPre-anesthesia Checklist: Patient identified, Emergency Drugs available, Suction available, Patient being monitored and Timeout performed Patient Re-evaluated:Patient Re-evaluated prior to induction Oxygen Delivery Method: Nasal cannula Preoxygenation: Pre-oxygenation with 100% oxygen

## 2024-04-24 NOTE — Plan of Care (Signed)
  Problem: Fluid Volume: Goal: Ability to maintain a balanced intake and output will improve Outcome: Progressing   Problem: Nutritional: Goal: Maintenance of adequate nutrition will improve Outcome: Progressing   Problem: Skin Integrity: Goal: Risk for impaired skin integrity will decrease Outcome: Progressing   Problem: Tissue Perfusion: Goal: Adequacy of tissue perfusion will improve Outcome: Progressing   Problem: Coping: Goal: Level of anxiety will decrease Outcome: Progressing   Problem: Pain Managment: Goal: General experience of comfort will improve and/or be controlled Outcome: Progressing   Problem: Skin Integrity: Goal: Risk for impaired skin integrity will decrease Outcome: Progressing

## 2024-04-25 ENCOUNTER — Encounter (HOSPITAL_COMMUNITY): Payer: Self-pay | Admitting: Gastroenterology

## 2024-04-25 DIAGNOSIS — J690 Pneumonitis due to inhalation of food and vomit: Secondary | ICD-10-CM | POA: Diagnosis not present

## 2024-04-25 DIAGNOSIS — K224 Dyskinesia of esophagus: Secondary | ICD-10-CM | POA: Diagnosis not present

## 2024-04-25 DIAGNOSIS — J9601 Acute respiratory failure with hypoxia: Secondary | ICD-10-CM | POA: Diagnosis not present

## 2024-04-25 LAB — CBC WITH DIFFERENTIAL/PLATELET
Abs Immature Granulocytes: 0.03 K/uL (ref 0.00–0.07)
Basophils Absolute: 0 K/uL (ref 0.0–0.1)
Basophils Relative: 0 %
Eosinophils Absolute: 0.1 K/uL (ref 0.0–0.5)
Eosinophils Relative: 2 %
HCT: 30.8 % — ABNORMAL LOW (ref 39.0–52.0)
Hemoglobin: 9.5 g/dL — ABNORMAL LOW (ref 13.0–17.0)
Immature Granulocytes: 1 %
Lymphocytes Relative: 26 %
Lymphs Abs: 1.6 K/uL (ref 0.7–4.0)
MCH: 24.5 pg — ABNORMAL LOW (ref 26.0–34.0)
MCHC: 30.8 g/dL (ref 30.0–36.0)
MCV: 79.6 fL — ABNORMAL LOW (ref 80.0–100.0)
Monocytes Absolute: 0.4 K/uL (ref 0.1–1.0)
Monocytes Relative: 7 %
Neutro Abs: 3.9 K/uL (ref 1.7–7.7)
Neutrophils Relative %: 64 %
Platelets: 436 K/uL — ABNORMAL HIGH (ref 150–400)
RBC: 3.87 MIL/uL — ABNORMAL LOW (ref 4.22–5.81)
RDW: 18.3 % — ABNORMAL HIGH (ref 11.5–15.5)
WBC: 6.1 K/uL (ref 4.0–10.5)
nRBC: 0 % (ref 0.0–0.2)

## 2024-04-25 LAB — PHOSPHORUS: Phosphorus: 3.7 mg/dL (ref 2.5–4.6)

## 2024-04-25 LAB — COMPREHENSIVE METABOLIC PANEL WITH GFR
ALT: 46 U/L — ABNORMAL HIGH (ref 0–44)
AST: 27 U/L (ref 15–41)
Albumin: 2.6 g/dL — ABNORMAL LOW (ref 3.5–5.0)
Alkaline Phosphatase: 96 U/L (ref 38–126)
Anion gap: 6 (ref 5–15)
BUN: 20 mg/dL (ref 6–20)
CO2: 26 mmol/L (ref 22–32)
Calcium: 8.3 mg/dL — ABNORMAL LOW (ref 8.9–10.3)
Chloride: 104 mmol/L (ref 98–111)
Creatinine, Ser: 1.21 mg/dL (ref 0.61–1.24)
GFR, Estimated: >60 mL/min (ref >60)
Glucose, Bld: 289 mg/dL — ABNORMAL HIGH (ref 70–99)
Potassium: 4.5 mmol/L (ref 3.5–5.1)
Sodium: 136 mmol/L (ref 135–145)
Total Bilirubin: 0.4 mg/dL (ref 0.0–1.2)
Total Protein: 6.1 g/dL — ABNORMAL LOW (ref 6.5–8.1)

## 2024-04-25 LAB — GLUCOSE, CAPILLARY
Glucose-Capillary: 228 mg/dL — ABNORMAL HIGH (ref 70–99)
Glucose-Capillary: 330 mg/dL — ABNORMAL HIGH (ref 70–99)
Glucose-Capillary: 257 mg/dL — ABNORMAL HIGH (ref 70–99)
Glucose-Capillary: 300 mg/dL — ABNORMAL HIGH (ref 70–99)

## 2024-04-25 LAB — MAGNESIUM: Magnesium: 2.1 mg/dL (ref 1.7–2.4)

## 2024-04-25 MED ORDER — HYDROMORPHONE HCL 2 MG PO TABS
4.0000 mg | ORAL_TABLET | Freq: Four times a day (QID) | ORAL | Status: DC | PRN
  Administered 2024-04-25 – 2024-04-27 (×10): 4 mg via ORAL
  Filled 2024-04-25 (×10): qty 2

## 2024-04-25 MED ORDER — ENOXAPARIN SODIUM 60 MG/0.6ML IJ SOSY
60.0000 mg | PREFILLED_SYRINGE | INTRAMUSCULAR | Status: DC
  Administered 2024-04-25 – 2024-04-27 (×3): 60 mg via SUBCUTANEOUS
  Filled 2024-04-25 (×3): qty 0.6

## 2024-04-25 MED ORDER — AMOXICILLIN-POT CLAVULANATE 875-125 MG PO TABS
1.0000 | ORAL_TABLET | Freq: Two times a day (BID) | ORAL | Status: AC
  Administered 2024-04-25 – 2024-04-27 (×5): 1 via ORAL
  Filled 2024-04-25 (×5): qty 1

## 2024-04-25 MED ORDER — INSULIN GLARGINE-YFGN 100 UNIT/ML ~~LOC~~ SOLN
24.0000 [IU] | Freq: Every day | SUBCUTANEOUS | Status: DC
  Administered 2024-04-25 – 2024-04-26 (×2): 24 [IU] via SUBCUTANEOUS
  Filled 2024-04-25 (×3): qty 0.24

## 2024-04-25 NOTE — Plan of Care (Signed)
  Problem: Clinical Measurements: Goal: Respiratory complications will improve Outcome: Progressing Goal: Cardiovascular complication will be avoided Outcome: Progressing   Problem: Nutrition: Goal: Adequate nutrition will be maintained Outcome: Progressing   Problem: Elimination: Goal: Will not experience complications related to urinary retention Outcome: Progressing   Problem: Safety: Goal: Ability to remain free from injury will improve Outcome: Progressing

## 2024-04-25 NOTE — Progress Notes (Signed)
 Mobility Specialist Progress Note:    04/25/24 1115  Mobility  Activity Dangled on edge of bed (Bedside exercises)  Level of Assistance Minimal assist, patient does 75% or more  Assistive Device None  Activity Response Tolerated fair  Mobility Referral Yes  Mobility visit 1 Mobility  Mobility Specialist Start Time (ACUTE ONLY) 1115  Mobility Specialist Stop Time (ACUTE ONLY) 1120  Mobility Specialist Time Calculation (min) (ACUTE ONLY) 5 min   Pt had just ambulated w/ staff to bathroom but was willing to do some bedside exercises (Leg Ext. Leg Curl, Calf raises ea x5). Pt c/o slight pain while breathing w/ RN knowing from exertion. Left pt in bed w/ all needs met.   Venetia Keel Mobility Specialist Please Neurosurgeon or Rehab Office at [phone]

## 2024-04-25 NOTE — Progress Notes (Signed)
 PROGRESS NOTE    Brett White  FMW:[id number] DOB: 09/22/1961 DOA: 04/20/2024 PCP: [facility]   Brief Narrative:  Brett White is a 63 y.o. 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  Patient is from [facility], facility stated patient was around 78% on room air.  Patient he is not on oxygen. In the ED, pt tachycardic. Labs notable for glucose 368. CXR w/o focal consolidations. CT chest w/ bilateral pleural effusions.  Of note, patient has multiple frequent hospitalization and concern for significant drug-seeking behavior.  Patient admitted for further management.    On 04/21/24 patient reported that his pain is all over his body, denies any specific chest pain or abdominal pain.  Laying in bed, appearing comfortable.  Currently on 2 L of oxygen, saturating well  Esophagram showed severe esophageal dysmotility and SLP employing strategies to implement given his reduced clearance.  Consulted GI for further evaluation and take the patient for EGD which showed no endoscopic esophageal abnormality to explain the dysphagia but his esophagus was dilated.  Today the patient thinks that his swallowing is easier.  Anticipate discharge in the next 24 to 48 hours if respiratory status stays stable.  Assessment and Plan:  Chest pain and this could be likely in the setting of esophageal dysmotility: Denies any current left-sided chest pain. Troponins negative. EKG with sinus tachycardia, no acute ST changes. Last echo done on 04/15/24, showed EF of 55 to 60%, no regional wall motion abnormality, left ventricular diastolic parameters are indeterminate. CTM on Telemetry    Acute Hypoxic Respiratory Failure in the setting of Likely aspiration event/pneumonia from Esophageal Dysmotility and Unlikely acute HFpEF exacerbation -Oxygen sats dropped to 78% on room air with recovery, possibly an aspiration event (if  accurate) -Currently afebrile, with no leukocytosis -BNP 28, trended downwards from 163  -CXR w/o focal consolidations -CT chest with b/l tiny pleural effusions, pulmonary artery hypertension, noted dilated esophagus with food/debris's within, suggest possibly esophageal dysmotility/GERD; see below -Discontinue IV Lasix  for now -Esophagram done and even thought it was a limited study it showed Significant esophageal dysmotility with associated diffuse esophageal distension, stasis, and tertiary contractions. The Barium tablet lodged in distal esophagus near GE junction but no underlying severe stricture or obstruction appreciated fluoroscopically.  -Consulted GI because of this and they are planning on EGD on 04/24/24 and this showed LA Grade B esophagitis w/ no bleeding and no endoscopic esophageal abnormality to explain the patient's dysphagia however his esophagus was dilated.  Notably he was also found to have a 2 cm hiatal hernia and a normal stomach and normal examined duodenum and biopsies were taken with cold forceps -SLP evaluation being done and gave him precautions.  -Cover with IV Unasyn , may switch to p.o. Augmentin  to complete 7 days -Continue DuoNebs -Supplemental O2 as needed, plan to wean off; Repeat CXR in the AM and will need an ambulatory Home O2 Screen prior to D/C   Esophageal Dysmotolity and Dysphagia: see Above   Transaminitis/ Abnormal LFTs, improving -Alkaline phosphatase and T. bili WNL. Unknown etiology but improving as AST went from 134 at its peak and is now 27 and ALT went from 128 at its peak and is now 46 -Right upper quadrant ultrasound showed normal hepatobiliary; Hepatitis panel Negative  -CTM and Trend intermittently    Chronic Diastolic HF, ruled out Acute Exacerbation: Appears euvolemic. 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  Continue SSI with moderate NovoLog /scale insulin  ACHS, and continue long-acting Lantus   insulin  glargine 24 units subcu nightly and continue insulin  aspart 2 units SQ 3 times daily with meals, Accu-Cheks, hypoglycemic protocol On Farxiga , glipizide , Trulicity . CTM CBGs per Protocol. CBG Trend ranging from 228-330  Renal Insuffiencey: BUN/Cr Trend improved: Recent Labs  Lab 04/17/24 0352 04/18/24 0324 04/20/24 0920 04/22/24 0508 04/23/24 0435 04/24/24 0540 04/25/24 0248  BUN 33* 27* 22* 36* 31* 22* 20  CREATININE 1.20 0.96 1.13 1.38* 1.16 1.06 1.21  -Avoid Nephrotoxic Medications, Contrast Dyes, Hypotension and Dehydration to Ensure Adequate Renal Perfusion and will need to Renally Adjust Meds -Continue to Monitor and Trend Renal Function carefully and repeat CMP in the AM   Chronic pain: PTA Lyrica  100mg  BID, reduced dose Dilaudid  2mg  q6h (4mg  q6h pta), and duloxetine  60mg  daily. Resume po Diluadid for now and c/w other regimen per [facility]   Essential HTN: C/w PTA amlodipine  10mg  daily, hold losartan  for now. CTM BP per Protocol. Last BP reading was 144/87.     GERD/GI Prophylaxis: C/w PTA PPI and now on BID dosing   Ascending aorta dilation: Noted on recent Transthoracic Echocardiogram measuring 42 mm, in addition to dilation of the aortic root measuring 43 mm.   Hypoalbuminemia: Patient's Albumin  Trend ranging from 2.3-3.4. (2.6 today) CTM and Trend and repeat CMP in the AM  Class I Obesity: Complicates overall prognosis and care. Estimated body mass index is 33.26 kg/m as calculated from the following:   Height as of this encounter: 6' 2 (1.88 m).   Weight as of this encounter: 117.5 kg. Weight Loss and Dietary Counseling given   DVT prophylaxis: Enoxaparin  60 mg sq q24h    Code Status: Full Code Family Communication: No family present @ bedside  Disposition Plan:  Level of care: Telemetry Cardiac Status is: Inpatient Remains inpatient appropriate because: Will monitor overnight and anticipate discharging back to this SNF in the next 24 to 48 hours given that he  is approaching medical stability   Consultants:  Gastroenterology  Procedures:  EGD  Antimicrobials:  Anti-infectives (From admission, onward)    Start     Dose/Rate Route Frequency Ordered Stop   04/25/24 1900  amoxicillin -clavulanate (AUGMENTIN ) 875-125 MG per tablet 1 tablet        1 tablet Oral Every 12 hours 04/25/24 1409 04/28/24 0959   04/21/24 1400  ampicillin -sulbactam (UNASYN ) 1.5 g in sodium chloride  0.9 % 100 mL IVPB  Status:  Discontinued        1.5 g 200 mL/hr over 30 Minutes Intravenous 4 times daily 04/21/24 1122 04/25/24 1410       Subjective: Seen and examined at bedside and states that his lungs are still hurting.  No nausea or vomiting.  Thinks is little bit easier for him to swallow today compared to yesterday.  No nausea or vomiting.  Denies any other concerns or complaints at this time.  Objective: Vitals:   04/25/24 1144 04/25/24 1202 04/25/24 1627 04/25/24 1945  BP: 132/84  (!) 159/91 (!) 144/87  Pulse: 89 90 92 95  Resp: 20 17 15 20   Temp: 98.7 F (37.1 C)  98.2 F (36.8 C) 99 F (37.2 C)  TempSrc: Oral  Oral Oral  SpO2: 97% 100% 92% 93%  Weight:      Height:        Intake/Output Summary (Last 24 hours) at 04/25/2024 2025 Last data filed at 04/25/2024 1049 Gross per  24 hour  Intake 600 ml  Output 1250 ml  Net -650 ml   Filed Weights   04/20/24 0917 04/20/24 1819  Weight: 127.2 kg 117.5 kg   Examination: Physical Exam:  Constitutional: WN/WD obese chronically ill-appearing Caucasian male in no acute distress Respiratory: Diminished to auscultation bilaterally with some coarse breath sounds, no wheezing, rales, rhonchi or crackles. Normal respiratory effort and patient is not tachypenic. No accessory muscle use.  Wearing supplemental oxygen via nasal cannula Cardiovascular: RRR, no murmurs / rubs / gallops. S1 and S2 auscultated.  Has some slight lower extremity edema on the right and has a left BKA Abdomen: Soft, non-tender, distended  secondary to body habitus. Bowel sounds positive.  GU: Deferred. 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 No induration; Warm and dry.  Neurologic: CN 2-12 grossly intact with no focal deficits. Romberg sign and cerebellar reflexes not assessed.  Psychiatric: Normal judgment and insight. Alert and oriented x 3. Normal mood and appropriate affect.   Data Reviewed: I have personally reviewed following labs and imaging studies  CBC: Recent Labs  Lab 04/20/24 0920 04/22/24 0508 04/23/24 0435 04/24/24 0540 04/25/24 0248  WBC 7.1 6.7 5.7 5.9 6.1  NEUTROABS  --  4.2 3.1 3.7 3.9  HGB 11.5* 10.4* 10.2* 10.1* 9.5*  HCT 37.3* 34.0* 33.4* 33.1* 30.8*  MCV 78.9* 79.8* 80.1 79.8* 79.6*  PLT 542* 509* 480* 468* 436*   Basic Metabolic Panel: Recent Labs  Lab 04/19/24 0326 04/20/24 0920 04/22/24 0508 04/23/24 0435 04/24/24 0540 04/25/24 0248  NA  --  135 134* 133* 137 136  K  --  4.9 4.7 4.5 4.6 4.5  CL  --  95* 95* 98 101 104  CO2  --  27 30 28 28 26   GLUCOSE  --  280* 329* 372* 186* 289*  BUN  --  22* 36* 31* 22* 20  CREATININE  --  1.13 1.38* 1.16 1.06 1.21  CALCIUM   --  9.3 8.9 8.6* 8.6* 8.3*  MG 1.9  --   --  2.0 2.0 2.1  PHOS  --   --   --  4.0 3.1 3.7   GFR: Estimated Creatinine Clearance: 88.4 mL/min (by C-G formula based on SCr of 1.21 mg/dL). Liver Function Tests: Recent Labs  Lab 04/20/24 0920 04/22/24 0508 04/23/24 0435 04/24/24 0540 04/25/24 0248  AST 134* 56* 39 32 27  ALT 128* 90* 70* 56* 46*  ALKPHOS 125 109 100 102 96  BILITOT 0.5 0.7 0.4 0.5 0.4  PROT 7.4 6.7 6.4* 6.4* 6.1*  ALBUMIN  3.0* 2.7* 2.6* 2.7* 2.6*   No results for input(s): LIPASE, AMYLASE in the last 168 hours. No results for input(s): AMMONIA in the last 168 hours. Coagulation Profile: No results for input(s): INR, PROTIME in the last 168 hours. Cardiac Enzymes: No results for input(s): CKTOTAL, CKMB, CKMBINDEX,  TROPONINI in the last 168 hours. BNP (last 3 results) No results for input(s): PROBNP in the last 8760 hours. HbA1C: No results for input(s): HGBA1C in the last 72 hours. CBG: Recent Labs  Lab 04/24/24 1705 04/24/24 2234 04/25/24 0727 04/25/24 1142 04/25/24 1625  GLUCAP 217* 301* 228* 330* 257*   Lipid Profile: No results for input(s): CHOL, HDL, LDLCALC, TRIG, CHOLHDL, LDLDIRECT in the last 72 hours. Thyroid Function Tests: No results for input(s): TSH, T4TOTAL, FREET4, T3FREE, THYROIDAB in the last 72 hours. Anemia Panel: No results for input(s): VITAMINB12, FOLATE, FERRITIN, TIBC, IRON, RETICCTPCT in  the last 72 hours. Sepsis Labs: Recent Labs  Lab 04/22/24 0818  PROCALCITON <0.10   No results found for this or any previous visit (from the past 240 hours).   Radiology Studies: No results found.  Scheduled Meds:  amLODipine   10 mg Oral q morning   amoxicillin -clavulanate  1 tablet Oral Q12H   artificial tears  1 drop Left Eye QID   atorvastatin   40 mg Oral QHS   brimonidine   1 drop Left Eye BID   DULoxetine   60 mg Oral q AM   enoxaparin  (LOVENOX ) injection  60 mg Subcutaneous Q24H   famotidine   20 mg Oral QHS   insulin  aspart  0-15 Units Subcutaneous TID WC   insulin  aspart  0-5 Units Subcutaneous QHS   insulin  aspart  2 Units Subcutaneous TID WC   insulin  glargine-yfgn  24 Units Subcutaneous QHS   pantoprazole   40 mg Oral BID AC   polyethylene glycol  17 g Oral Daily   pregabalin   100 mg Oral BID   senna-docusate  1 tablet Oral BID   sertraline   50 mg Oral q AM   tamsulosin   0.4 mg Oral QPC supper   traZODone   75 mg Oral QHS   Continuous Infusions:   LOS: 5 days   Alejandro Marker, DO [facility] Available via Epic secure chat 7am-7pm After these hours, please refer to coverage provider listed on [email or web address] 04/25/2024, 8:25 PM

## 2024-04-26 ENCOUNTER — Inpatient Hospital Stay (HOSPITAL_COMMUNITY)

## 2024-04-26 DIAGNOSIS — J9601 Acute respiratory failure with hypoxia: Secondary | ICD-10-CM | POA: Diagnosis not present

## 2024-04-26 DIAGNOSIS — K224 Dyskinesia of esophagus: Secondary | ICD-10-CM | POA: Diagnosis not present

## 2024-04-26 DIAGNOSIS — J690 Pneumonitis due to inhalation of food and vomit: Secondary | ICD-10-CM | POA: Diagnosis not present

## 2024-04-26 LAB — CBC WITH DIFFERENTIAL/PLATELET
Abs Immature Granulocytes: 0.02 K/uL (ref 0.00–0.07)
Basophils Absolute: 0 K/uL (ref 0.0–0.1)
Basophils Relative: 1 %
Eosinophils Absolute: 0.1 K/uL (ref 0.0–0.5)
Eosinophils Relative: 3 %
HCT: 32.2 % — ABNORMAL LOW (ref 39.0–52.0)
Hemoglobin: 9.9 g/dL — ABNORMAL LOW (ref 13.0–17.0)
Immature Granulocytes: 0 %
Lymphocytes Relative: 31 %
Lymphs Abs: 1.6 K/uL (ref 0.7–4.0)
MCH: 24.5 pg — ABNORMAL LOW (ref 26.0–34.0)
MCHC: 30.7 g/dL (ref 30.0–36.0)
MCV: 79.7 fL — ABNORMAL LOW (ref 80.0–100.0)
Monocytes Absolute: 0.5 K/uL (ref 0.1–1.0)
Monocytes Relative: 9 %
Neutro Abs: 2.9 K/uL (ref 1.7–7.7)
Neutrophils Relative %: 56 %
Platelets: 412 K/uL — ABNORMAL HIGH (ref 150–400)
RBC: 4.04 MIL/uL — ABNORMAL LOW (ref 4.22–5.81)
RDW: 18.2 % — ABNORMAL HIGH (ref 11.5–15.5)
WBC: 5.1 K/uL (ref 4.0–10.5)
nRBC: 0 % (ref 0.0–0.2)

## 2024-04-26 LAB — GLUCOSE, CAPILLARY
Glucose-Capillary: 223 mg/dL — ABNORMAL HIGH (ref 70–99)
Glucose-Capillary: 321 mg/dL — ABNORMAL HIGH (ref 70–99)
Glucose-Capillary: 305 mg/dL — ABNORMAL HIGH (ref 70–99)
Glucose-Capillary: 257 mg/dL — ABNORMAL HIGH (ref 70–99)

## 2024-04-26 LAB — COMPREHENSIVE METABOLIC PANEL WITH GFR
ALT: 37 U/L (ref 0–44)
AST: 21 U/L (ref 15–41)
Albumin: 2.6 g/dL — ABNORMAL LOW (ref 3.5–5.0)
Alkaline Phosphatase: 90 U/L (ref 38–126)
Anion gap: 12 (ref 5–15)
BUN: 19 mg/dL (ref 6–20)
CO2: 23 mmol/L (ref 22–32)
Calcium: 8.8 mg/dL — ABNORMAL LOW (ref 8.9–10.3)
Chloride: 102 mmol/L (ref 98–111)
Creatinine, Ser: 1.11 mg/dL (ref 0.61–1.24)
GFR, Estimated: >60 mL/min (ref >60)
Glucose, Bld: 207 mg/dL — ABNORMAL HIGH (ref 70–99)
Potassium: 4.6 mmol/L (ref 3.5–5.1)
Sodium: 137 mmol/L (ref 135–145)
Total Bilirubin: 0.3 mg/dL (ref 0.0–1.2)
Total Protein: 6.1 g/dL — ABNORMAL LOW (ref 6.5–8.1)

## 2024-04-26 LAB — MAGNESIUM: Magnesium: 1.8 mg/dL (ref 1.7–2.4)

## 2024-04-26 LAB — PHOSPHORUS: Phosphorus: 3.6 mg/dL (ref 2.5–4.6)

## 2024-04-26 MED ORDER — PROCHLORPERAZINE EDISYLATE 10 MG/2ML IJ SOLN
5.0000 mg | Freq: Four times a day (QID) | INTRAMUSCULAR | Status: DC | PRN
  Filled 2024-04-26: qty 2

## 2024-04-26 MED ORDER — MAGNESIUM SULFATE 2 GM/50ML IV SOLN
2.0000 g | Freq: Once | INTRAVENOUS | Status: AC
  Administered 2024-04-26: 2 g via INTRAVENOUS
  Filled 2024-04-26: qty 50

## 2024-04-26 NOTE — Plan of Care (Signed)
  Problem: Health Behavior/Discharge Planning: Goal: Ability to manage health-related needs will improve Outcome: Progressing   Problem: Clinical Measurements: Goal: Respiratory complications will improve Outcome: Progressing Goal: Cardiovascular complication will be avoided Outcome: Progressing   Problem: Nutrition: Goal: Adequate nutrition will be maintained Outcome: Progressing   Problem: Pain Managment: Goal: General experience of comfort will improve and/or be controlled Outcome: Progressing   Problem: Safety: Goal: Ability to remain free from injury will improve Outcome: Progressing

## 2024-04-26 NOTE — TOC Progression Note (Signed)
 Transition of Care [facility]) - Progression Note    Patient Details  Name: Brett White MRN: [id number] Date of Birth: 1960-11-01  [facility] CM/SW Contact  Olam FORBES Ally, LCSW Phone Number: 04/26/2024, 2:09 PM  Clinical Narrative:     CSW was alerted by MD that patient was medically ready for DC. CSW spoke with Glenys at [facility] and she stated that they would have a bed tomorrow. MD updated about status of bed availability.  TOC team will continue to assist with discharge planning needs.    Expected Discharge Plan: Long Term Nursing Home Barriers to Discharge: Continued Medical Work up               Expected Discharge Plan and Services In-house Referral: Clinical Social Work   Post Acute Care Choice: Nursing Home Living arrangements for the past 2 months: Skilled Nursing Facility                                       Social Drivers of Health (SDOH) Interventions SDOH Screenings   Food Insecurity: No Food Insecurity (04/20/2024)  Housing: Low Risk  (04/20/2024)  Transportation Needs: No Transportation Needs (04/20/2024)  Utilities: Not At Risk (04/20/2024)  Tobacco Use: Low Risk  (04/24/2024)    Readmission Risk Interventions    04/21/2024    2:05 PM 04/13/2024    3:04 PM 11/16/2022    2:57 PM  Readmission Risk Prevention Plan  Transportation Screening Complete Complete Complete  Medication Review Oceanographer) Complete Complete Complete  PCP or Specialist appointment within 3-5 days of discharge Complete Complete Complete  HRI or Home Care Consult Complete Complete Complete  SW Recovery Care/Counseling Consult Complete Complete Complete  Palliative Care Screening Not Applicable Complete Complete  Skilled Nursing Facility Complete Complete Complete

## 2024-04-26 NOTE — Progress Notes (Signed)
 PROGRESS NOTE    Brett White  FMW:[id number] DOB: 01-Aug-1961 DOA: 04/20/2024 PCP: [facility]   Brief Narrative:  Frazer [place] is a 63 y.o. 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  Patient is from [facility], facility stated patient was around 78% on room air.  Patient he is not on oxygen. In the ED, pt tachycardic. Labs notable for glucose 368. CXR w/o focal consolidations. CT chest w/ bilateral pleural effusions.  Of note, patient has multiple frequent hospitalization and concern for significant drug-seeking behavior.  Patient admitted for further management.    On 04/21/24 patient reported that his pain is all over his body, denies any specific chest pain or abdominal pain.  Laying in bed, appearing comfortable.  Currently on 2 L of oxygen, saturating well  Esophagram showed severe esophageal dysmotility and SLP employing strategies to implement given his reduced clearance.  Consulted GI for further evaluation and take the patient for EGD which showed no endoscopic esophageal abnormality to explain the dysphagia but his esophagus was dilated.  Yesterday  the patient thinks that his swallowing is easier.  Anticipate discharge in the next 24 hours given improvement as he is medically stable and because bed will be available tomorrow.   Assessment and Plan:  Chest pain and this could be likely in the setting of esophageal dysmotility: Denies any current left-sided chest pain. Troponins negative. EKG with sinus tachycardia, no acute ST changes. Last echo done on 04/15/24, showed EF of 55 to 60%, no regional wall motion abnormality, left ventricular diastolic parameters are indeterminate. CTM on Telemetry    Acute Hypoxic Respiratory Failure in the setting of Likely aspiration event/pneumonia from Esophageal Dysmotility and Unlikely acute HFpEF exacerbation -Oxygen sats dropped to 78% on room air with  recovery, possibly an aspiration event (if accurate); SpO2: 92 %; O2 Flow Rate (L/min): 2 L/min; Now weaned off -Currently afebrile, with no leukocytosis -BNP 28, trended downwards from 163  -CXR w/o focal consolidations -CT chest with b/l tiny pleural effusions, pulmonary artery hypertension, noted dilated esophagus with food/debris's within, suggest possibly esophageal dysmotility/GERD; see below -Discontinue IV Lasix  for now -Esophagram done and even thought it was a limited study it showed Significant esophageal dysmotility with associated diffuse esophageal distension, stasis, and tertiary contractions. The Barium tablet lodged in distal esophagus near GE junction but no underlying severe stricture or obstruction appreciated fluoroscopically.  -Consulted GI because of this and they are planning on EGD on 04/24/24 and this showed LA Grade B esophagitis w/ no bleeding and no endoscopic esophageal abnormality to explain the patient's dysphagia however his esophagus was dilated.  Notably he was also found to have a 2 cm hiatal hernia and a normal stomach and normal examined duodenum and biopsies were taken with cold forceps -SLP evaluation being done and gave him precautions.  -Covered with IV Unasyn  and will switch to p.o. Augmentin  to complete 7 days -Continue DuoNebs -Supplemental O2 as needed, plan to wean off;  Will need an ambulatory Home O2 Screen prior to D/C  -Repeat CXR done today and showed no active disease; Improved and medically stable for D/C but per TOC bed not available at SNF today and can go tomorrow   Esophageal Dysmotolity and Dysphagia: see Above   Transaminitis/ Abnormal LFTs, Resolved -Alkaline phosphatase and T. bili WNL. AST and ALT have normalized. Right upper quadrant ultrasound showed normal hepatobiliary; Hepatitis panel Negative  -CTM and Trend  intermittently    Chronic Diastolic HF, ruled out Acute Exacerbation: Appears euvolemic. 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  Continue SSI with moderate NovoLog /scale insulin  ACHS, and continue long-acting Lantus  insulin  glargine 24 units subcu nightly and continue insulin  aspart 2 units SQ 3 times daily with meals, Accu-Cheks, hypoglycemic protocol On Farxiga , glipizide , Trulicity . CTM CBGs per Protocol. CBG Trend ranging from 223-321  Renal Insuffiencey: BUN/Cr Trend improved: Recent Labs  Lab 04/18/24 0324 04/20/24 0920 04/22/24 0508 04/23/24 0435 04/24/24 0540 04/25/24 0248 04/26/24 0501  BUN 27* 22* 36* 31* 22* 20 19  CREATININE 0.96 1.13 1.38* 1.16 1.06 1.21 1.11  -Avoid Nephrotoxic Medications, Contrast Dyes, Hypotension and Dehydration to Ensure Adequate Renal Perfusion and will need to Renally Adjust Meds -Continue to Monitor and Trend Renal Function carefully and repeat CMP in the AM   Chronic pain: PTA Lyrica  100mg  BID, reduced dose Dilaudid  2mg  q6h (4mg  q6h pta), and duloxetine  60mg  daily. Resume po Diluadid for now and c/w other regimen per [facility]   Essential HTN: C/w PTA amlodipine  10mg  daily, hold losartan  for now. CTM BP per Protocol. Last BP reading was 126/83.     GERD/GI Prophylaxis: C/w PTA PPI and now on BID dosing   Ascending Aorta Dilation: Noted on recent Transthoracic Echocardiogram measuring 42 mm, in addition to dilation of the aortic root measuring 43 mm.   Hypoalbuminemia: Patient's Albumin  Trend ranging from 2.3-3.4. (2.6 today) CTM and Trend and repeat CMP in the AM  Class I Obesity: Complicates overall prognosis and care. Estimated body mass index is 33.26 kg/m as calculated from the following:   Height as of this encounter: 6' 2 (1.88 m).   Weight as of this encounter: 117.5 kg. Weight Loss and Dietary Counseling given   DVT prophylaxis: Enoxaparin  60 mg sq q24h    Code Status: Full Code Family Communication: No family present @ bedside   Disposition Plan:  Level of care: Telemetry Cardiac Status is:  Inpatient Remains inpatient appropriate because: Medically stable to D/C to SNF and awaiting Bed available   Consultants:  Gastroenterology   Procedures:  EGD  Antimicrobials:  Anti-infectives (From admission, onward)    Start     Dose/Rate Route Frequency Ordered Stop   04/25/24 1900  amoxicillin -clavulanate (AUGMENTIN ) 875-125 MG per tablet 1 tablet        1 tablet Oral Every 12 hours 04/25/24 1409 04/28/24 0959   04/21/24 1400  ampicillin -sulbactam (UNASYN ) 1.5 g in sodium chloride  0.9 % 100 mL IVPB  Status:  Discontinued        1.5 g 200 mL/hr over 30 Minutes Intravenous 4 times daily 04/21/24 1122 04/25/24 1410       Subjective: Seen and examined at bedside and thinks he is feeling better.  Still complaining of some left-sided lung pain.  No nausea or vomiting.  Thinks his swallowing is easier and is not short of breath.  Denies any other concerns or complaints at this time.  Objective: Vitals:   04/26/24 0832 04/26/24 1006 04/26/24 1211 04/26/24 1607  BP: (!) 142/87 (!) 142/87 121/69 126/83  Pulse: 83  86 88  Resp: 11  17 12   Temp: 97.7 F (36.5 C)  97.8 F (36.6 C) 98 F (36.7 C)  TempSrc: Oral  Oral Oral  SpO2: 92%  92% 92%  Weight:      Height:        Intake/Output Summary (Last 24 hours) at  04/26/2024 1819 Last data filed at 04/26/2024 1300 Gross per 24 hour  Intake 50 ml  Output 1600 ml  Net -1550 ml   Filed Weights   04/20/24 0917 04/20/24 1819  Weight: 127.2 kg 117.5 kg   Examination: Physical Exam:  Constitutional: WN/WD obese chronically ill-appearing Caucasian male in no acute distress Respiratory: Diminished to auscultation bilaterally with some coarse breath sounds and had some mild rhonchi but no appreciable wheezing, rales or crackles. Normal respiratory effort and patient is not tachypenic. No accessory muscle use. Not wearing Supplemental O2 via [place] Cardiovascular: RRR, no murmurs / rubs / gallops. S1 and S2 auscultated. No extremity edema.  2+ pedal pulses. No carotid bruits. Mild LE edema on the Right  Abdomen: Soft, non-tender, Distended 2/2 body habitus. Bowel sounds positive.  GU: Deferred. Musculoskeletal: Has a Left BKA Skin: No rashes, lesions, ulcers on a limited skin evaluation. No induration; Warm and dry.  Neurologic: CN 2-12 grossly intact with no focal deficits. Romberg sign and cerebellar reflexes not assessed.  Psychiatric: Normal judgment and insight. Alert and oriented x 3. Normal mood and appropriate affect.   Data Reviewed: I have personally reviewed following labs and imaging studies  CBC: Recent Labs  Lab 04/22/24 0508 04/23/24 0435 04/24/24 0540 04/25/24 0248 04/26/24 0501  WBC 6.7 5.7 5.9 6.1 5.1  NEUTROABS 4.2 3.1 3.7 3.9 2.9  HGB 10.4* 10.2* 10.1* 9.5* 9.9*  HCT 34.0* 33.4* 33.1* 30.8* 32.2*  MCV 79.8* 80.1 79.8* 79.6* 79.7*  PLT 509* 480* 468* 436* 412*   Basic Metabolic Panel: Recent Labs  Lab 04/22/24 0508 04/23/24 0435 04/24/24 0540 04/25/24 0248 04/26/24 0501  NA 134* 133* 137 136 137  K 4.7 4.5 4.6 4.5 4.6  CL 95* 98 101 104 102  CO2 30 28 28 26 23   GLUCOSE 329* 372* 186* 289* 207*  BUN 36* 31* 22* 20 19  CREATININE 1.38* 1.16 1.06 1.21 1.11  CALCIUM  8.9 8.6* 8.6* 8.3* 8.8*  MG  --  2.0 2.0 2.1 1.8  PHOS  --  4.0 3.1 3.7 3.6   GFR: Estimated Creatinine Clearance: 96.4 mL/min (by C-G formula based on SCr of 1.11 mg/dL). Liver Function Tests: Recent Labs  Lab 04/22/24 0508 04/23/24 0435 04/24/24 0540 04/25/24 0248 04/26/24 0501  AST 56* 39 32 27 21  ALT 90* 70* 56* 46* 37  ALKPHOS 109 100 102 96 90  BILITOT 0.7 0.4 0.5 0.4 0.3  PROT 6.7 6.4* 6.4* 6.1* 6.1*  ALBUMIN  2.7* 2.6* 2.7* 2.6* 2.6*   No results for input(s): LIPASE, AMYLASE in the last 168 hours. No results for input(s): AMMONIA in the last 168 hours. Coagulation Profile: No results for input(s): INR, PROTIME in the last 168 hours. Cardiac Enzymes: No results for input(s): CKTOTAL, CKMB,  CKMBINDEX, TROPONINI in the last 168 hours. BNP (last 3 results) No results for input(s): PROBNP in the last 8760 hours. HbA1C: No results for input(s): HGBA1C in the last 72 hours. CBG: Recent Labs  Lab 04/25/24 1625 04/25/24 2051 04/26/24 0831 04/26/24 1211 04/26/24 1602  GLUCAP 257* 300* 223* 321* 305*   Lipid Profile: No results for input(s): CHOL, HDL, LDLCALC, TRIG, CHOLHDL, LDLDIRECT in the last 72 hours. Thyroid Function Tests: No results for input(s): TSH, T4TOTAL, FREET4, T3FREE, THYROIDAB in the last 72 hours. Anemia Panel: No results for input(s): VITAMINB12, FOLATE, FERRITIN, TIBC, IRON, RETICCTPCT in the last 72 hours. Sepsis Labs: Recent Labs  Lab 04/22/24 0818  PROCALCITON <0.10   No results found  for this or any previous visit (from the past 240 hours).   Radiology Studies: DG CHEST PORT 1 VIEW Result Date: 04/26/2024 CLINICAL DATA:  Shortness of breath. EXAM: PORTABLE CHEST 1 VIEW COMPARISON:  Chest radiograph dated 04/23/2024. FINDINGS: No focal consolidation, pleural effusion or pneumothorax. The cardiac silhouette is within normal limits. No acute osseous pathology. IMPRESSION: No active disease. Electronically Signed   By: Vanetta Chou M.D.   On: 04/26/2024 10:59   Scheduled Meds:  amLODipine   10 mg Oral q morning   amoxicillin -clavulanate  1 tablet Oral Q12H   artificial tears  1 drop Left Eye QID   atorvastatin   40 mg Oral QHS   brimonidine   1 drop Left Eye BID   DULoxetine   60 mg Oral q AM   enoxaparin  (LOVENOX ) injection  60 mg Subcutaneous Q24H   famotidine   20 mg Oral QHS   insulin  aspart  0-15 Units Subcutaneous TID WC   insulin  aspart  0-5 Units Subcutaneous QHS   insulin  aspart  2 Units Subcutaneous TID WC   insulin  glargine-yfgn  24 Units Subcutaneous QHS   pantoprazole   40 mg Oral BID AC   polyethylene glycol  17 g Oral Daily   pregabalin   100 mg Oral BID   senna-docusate  1 tablet Oral BID    sertraline   50 mg Oral q AM   tamsulosin   0.4 mg Oral QPC supper   traZODone   75 mg Oral QHS   Continuous Infusions:   LOS: 6 days   Alejandro Marker, DO [facility] Available via Epic secure chat 7am-7pm After these hours, please refer to coverage provider listed on [email or web address] 04/26/2024, 6:19 PM

## 2024-04-26 NOTE — Plan of Care (Signed)
  Problem: Education: Goal: Ability to describe self-care measures that may prevent or decrease complications (Diabetes Survival Skills Education) will improve Outcome: Progressing   Problem: Coping: Goal: Ability to adjust to condition or change in health will improve Outcome: Progressing   Problem: Fluid Volume: Goal: Ability to maintain a balanced intake and output will improve Outcome: Progressing   Problem: Health Behavior/Discharge Planning: Goal: Ability to identify and utilize available resources and services will improve Outcome: Progressing Goal: Ability to manage health-related needs will improve Outcome: Progressing   Problem: Metabolic: Goal: Ability to maintain appropriate glucose levels will improve Outcome: Progressing   Problem: Nutritional: Goal: Maintenance of adequate nutrition will improve Outcome: Progressing Goal: Progress toward achieving an optimal weight will improve Outcome: Progressing   Problem: Skin Integrity: Goal: Risk for impaired skin integrity will decrease Outcome: Progressing   Problem: Tissue Perfusion: Goal: Adequacy of tissue perfusion will improve Outcome: Progressing   Problem: Education: Goal: Knowledge of General Education information will improve Description: Including pain rating scale, medication(s)/side effects and non-pharmacologic comfort measures Outcome: Progressing   Problem: Health Behavior/Discharge Planning: Goal: Ability to manage health-related needs will improve Outcome: Progressing   Problem: Clinical Measurements: Goal: Ability to maintain clinical measurements within normal limits will improve Outcome: Progressing Goal: Will remain free from infection Outcome: Progressing   Problem: Nutrition: Goal: Adequate nutrition will be maintained Outcome: Progressing   Problem: Coping: Goal: Level of anxiety will decrease Outcome: Progressing

## 2024-04-27 LAB — GLUCOSE, CAPILLARY
Glucose-Capillary: 211 mg/dL — ABNORMAL HIGH (ref 70–99)
Glucose-Capillary: 260 mg/dL — ABNORMAL HIGH (ref 70–99)
Glucose-Capillary: 254 mg/dL — ABNORMAL HIGH (ref 70–99)
Glucose-Capillary: 269 mg/dL — ABNORMAL HIGH (ref 70–99)

## 2024-04-27 LAB — SURGICAL PATHOLOGY

## 2024-04-27 LAB — CREATININE, SERUM
Creatinine, Ser: 1.1 mg/dL (ref 0.61–1.24)
GFR, Estimated: >60 mL/min (ref >60)

## 2024-04-27 MED ORDER — POLYVINYL ALCOHOL 1.4 % OP SOLN: 1.0000 [drp] | Freq: Four times a day (QID) | OPHTHALMIC | 0 refills | Status: AC

## 2024-04-27 MED ORDER — IPRATROPIUM-ALBUTEROL 0.5-2.5 (3) MG/3ML IN SOLN: 3.0000 mL | Freq: Four times a day (QID) | RESPIRATORY_TRACT | Status: AC | PRN

## 2024-04-27 MED ORDER — ACETAMINOPHEN 325 MG PO TABS: 650.0000 mg | ORAL_TABLET | Freq: Four times a day (QID) | ORAL | Status: AC | PRN

## 2024-04-27 MED ORDER — OXYCODONE HCL 5 MG PO TABS: 5.0000 mg | ORAL_TABLET | Freq: Four times a day (QID) | ORAL | 0 refills | Status: DC | PRN

## 2024-04-27 MED ORDER — TAMSULOSIN HCL 0.4 MG PO CAPS: 0.4000 mg | ORAL_CAPSULE | Freq: Every day | ORAL | Status: AC

## 2024-04-27 MED ORDER — HYDROMORPHONE HCL 4 MG PO TABS: 4.0000 mg | ORAL_TABLET | Freq: Four times a day (QID) | ORAL | 0 refills | Status: DC | PRN

## 2024-04-27 MED ORDER — SENNOSIDES-DOCUSATE SODIUM 8.6-50 MG PO TABS: 1.0000 | ORAL_TABLET | Freq: Two times a day (BID) | ORAL | Status: AC

## 2024-04-27 MED ORDER — INSULIN GLARGINE-YFGN 100 UNIT/ML ~~LOC~~ SOLN
30.0000 [IU] | Freq: Every day | SUBCUTANEOUS | Status: DC
  Administered 2024-04-27: 30 [IU] via SUBCUTANEOUS
  Filled 2024-04-27: qty 0.3

## 2024-04-27 MED ORDER — PANTOPRAZOLE SODIUM 40 MG PO TBEC: 40.0000 mg | DELAYED_RELEASE_TABLET | Freq: Two times a day (BID) | ORAL | 0 refills | Status: AC

## 2024-04-27 MED ORDER — AMOXICILLIN-POT CLAVULANATE 875-125 MG PO TABS: 1.0000 | ORAL_TABLET | Freq: Two times a day (BID) | ORAL | 0 refills | Status: AC

## 2024-04-27 NOTE — TOC Progression Note (Signed)
 Transition of Care [facility]) - Progression Note    Patient Details  Name: Merdith Boyd MRN: [id number] Date of Birth: April 11, 1961  [facility] CM/SW Contact  Isaiah Public, LCSWA Phone Number: 04/27/2024, 3:01 PM  Clinical Narrative:     CSW spoke with Glenys with [facility] who confirmed patient can dc over to facility today if medically stable for dc. CSW informed MD.  Expected Discharge Plan: Long Term Nursing Home Barriers to Discharge: Continued Medical Work up               Expected Discharge Plan and Services In-house Referral: Clinical Social Work   Post Acute Care Choice: Nursing Home Living arrangements for the past 2 months: Skilled Nursing Facility Expected Discharge Date: 04/27/24                                     Social Drivers of Health (SDOH) Interventions SDOH Screenings   Food Insecurity: No Food Insecurity (04/20/2024)  Housing: Low Risk  (04/20/2024)  Transportation Needs: No Transportation Needs (04/20/2024)  Utilities: Not At Risk (04/20/2024)  Tobacco Use: Low Risk  (04/24/2024)    Readmission Risk Interventions    04/21/2024    2:05 PM 04/13/2024    3:04 PM 11/16/2022    2:57 PM  Readmission Risk Prevention Plan  Transportation Screening Complete Complete Complete  Medication Review Oceanographer) Complete Complete Complete  PCP or Specialist appointment within 3-5 days of discharge Complete Complete Complete  HRI or Home Care Consult Complete Complete Complete  SW Recovery Care/Counseling Consult Complete Complete Complete  Palliative Care Screening Not Applicable Complete Complete  Skilled Nursing Facility Complete Complete Complete

## 2024-04-27 NOTE — Progress Notes (Signed)
Report called to [facility]

## 2024-04-27 NOTE — Anesthesia Postprocedure Evaluation (Signed)
 Anesthesia Post Note  Patient: Brett White  Procedure(s) Performed: EGD (ESOPHAGOGASTRODUODENOSCOPY) Balloon dilation wire-guided     Patient location during evaluation: Endoscopy Anesthesia Type: MAC Level of consciousness: awake and alert Pain management: pain level controlled Vital Signs Assessment: post-procedure vital signs reviewed and stable Respiratory status: spontaneous breathing, nonlabored ventilation, respiratory function stable and patient connected to nasal cannula oxygen Cardiovascular status: stable and blood pressure returned to baseline Postop Assessment: no apparent nausea or vomiting Anesthetic complications: no   No notable events documented.  Last Vitals:  Vitals:   04/27/24 0745 04/27/24 1124  BP: 137/86 117/76  Pulse: 86 90  Resp: 18 16  Temp: 36.7 C 36.8 C  SpO2: 90% 93%    Last Pain:  Vitals:   04/27/24 1124  TempSrc: Oral  PainSc:                  Mylena Sedberry S

## 2024-04-27 NOTE — NC FL2 (Signed)
 [place]  MEDICAID FL2 LEVEL OF CARE FORM     IDENTIFICATION  Patient Name: Brett White Birthdate: Feb 12, 1961 Sex: male Admission Date (Current Location): 04/20/2024  [facility] and [place] Number:  Producer, television/film/video and Address:  [facility] [place], [address], [place], [place] [address]      Provider Number: [id number]  Attending Physician Name and Address:  Sherrill Alejandro Donovan, DO  Relative Name and Phone Number:  Rosaline Limerick (relative) [phone]    Current Level of Care: Hospital Recommended Level of Care: Skilled Nursing Facility Prior Approval Number:    Date Approved/Denied:   PASRR Number: [id number]  Discharge Plan: SNF    Current Diagnoses: Patient Active Problem List   Diagnosis Date Noted   Acute hypoxemic respiratory failure (HCC) 04/20/2024   Acute renal failure superimposed on stage 2 chronic kidney disease (HCC) 04/11/2024   SIRS (systemic inflammatory response syndrome) (HCC) 04/11/2024   Acute cystitis 03/20/2024   Hyperkalemia 03/20/2024   Generalized anxiety disorder 03/20/2024   Chronic pain syndrome 03/20/2024   History of abdominal aortic aneurysm (AAA) 03/20/2024   HLD (hyperlipidemia) 03/20/2024   Narcotic dependence (HCC) 03/20/2024   Peripheral neuropathy 03/20/2024   Reactive airway disease 03/20/2024   Insomnia 03/20/2024   Unspecified open wound, right foot, initial encounter 05/14/2023   Pneumonia 11/16/2022   Acute respiratory failure with hypoxia (HCC) 11/16/2022   Sepsis with acute organ dysfunction (HCC) 11/16/2022   Aspiration pneumonia (HCC) 11/16/2022   GERD without esophagitis 06/11/2022   Mixed diabetic hyperlipidemia associated with type 2 diabetes mellitus (HCC) 06/11/2022   Adjustment disorder with depressed mood 05/23/2022   Chronic hyponatremia 12/09/2021   Uncontrolled type 2 diabetes mellitus with hyperglycemia, without long-term current use of insulin  (HCC) 12/09/2021    Tachycardia 12/09/2021   Acute kidney injury superimposed on chronic kidney disease (HCC) 10/04/2021   S/P BKA (below knee amputation) unilateral, left (HCC) 04/05/2021   Insulin  dependent type 2 diabetes mellitus (HCC) 02/03/2021   Essential hypertension 02/03/2021   Diabetic polyneuropathy associated with type 2 diabetes mellitus (HCC) 02/03/2021    Orientation RESPIRATION BLADDER Height & Weight     Self, Time, Situation, Place  Normal Continent Weight: 259 lb 0.7 oz (117.5 kg) Height:  6' 2 (188 cm)  BEHAVIORAL SYMPTOMS/MOOD NEUROLOGICAL BOWEL NUTRITION STATUS      Continent Diet (Please see discharge summary)  AMBULATORY STATUS COMMUNICATION OF NEEDS Skin   Limited Assist Verbally Other (Comment) Ileana virgil Chars)                       Personal Care Assistance Level of Assistance    Bathing Assistance: Limited assistance Feeding assistance: Independent Dressing Assistance: Limited assistance     Functional Limitations Info  Sight, Hearing Sight Info: Impaired Hearing Info: Impaired Speech Info: Adequate    SPECIAL CARE FACTORS FREQUENCY                       Contractures Contractures Info: Not present    Additional Factors Info  Code Status, Allergies, Psychotropic, Insulin  Sliding Scale, Isolation Precautions Code Status Info: FULL Allergies Info: NKA Psychotropic Info: DULoxetine  (CYMBALTA ) DR capsule 60 mg every morning,pregabalin  (LYRICA ) capsule 100 mg 2 times daily,sertraline  (ZOLOFT ) tablet 50 mg every morning,traZODone  (DESYREL ) tablet 75 mg daily at bedtime Insulin  Sliding Scale Info: insulin  aspart (novoLOG ) injection 0-15 Units 3 times daily with meals,insulin  aspart (novoLOG ) injection 2 Units 3 times daily with meals,insulin  glargine-yfgn (SEMGLEE ) injection  15 Units daily at bedtime Isolation Precautions Info: ESBL,MRSA     Current Medications (04/27/2024):  This is the current hospital active medication list Current  Facility-Administered Medications  Medication Dose Route Frequency Provider Last Rate Last Admin   acetaminophen  (TYLENOL ) tablet 650 mg  650 mg Oral Q6H PRN Sheikh, Omair Latif, DO   650 mg at 04/24/24 1408   amLODipine  (NORVASC ) tablet 10 mg  10 mg Oral q morning Georgina Basket, MD   10 mg at 04/27/24 0828   amoxicillin -clavulanate (AUGMENTIN ) 875-125 MG per tablet 1 tablet  1 tablet Oral Q12H Sherrill Cable Kerhonkson, DO   1 tablet at 04/27/24 9171   artificial tears ophthalmic solution 1 drop  1 drop Left Eye QID Georgina Basket, MD   1 drop at 04/27/24 1047   atorvastatin  (LIPITOR) tablet 40 mg  40 mg Oral QHS Georgina Basket, MD   40 mg at 04/26/24 2312   brimonidine  (ALPHAGAN ) 0.2 % ophthalmic solution 1 drop  1 drop Left Eye BID Georgina Basket, MD   1 drop at 04/27/24 1048   DULoxetine  (CYMBALTA ) DR capsule 60 mg  60 mg Oral q AM Georgina Basket, MD   60 mg at 04/27/24 0531   enoxaparin  (LOVENOX ) injection 60 mg  60 mg Subcutaneous Q24H Sheikh, Omair Latif, DO   60 mg at 04/26/24 1438   famotidine  (PEPCID ) tablet 20 mg  20 mg Oral QHS Nandigam, Kavitha V, MD   20 mg at 04/26/24 2311   HYDROmorphone  (DILAUDID ) tablet 4 mg  4 mg Oral Q6H PRN Sheikh, Omair Latif, DO   4 mg at 04/27/24 0827   insulin  aspart (novoLOG ) injection 0-15 Units  0-15 Units Subcutaneous TID WC Georgina Basket, MD   8 Units at 04/27/24 1225   insulin  aspart (novoLOG ) injection 0-5 Units  0-5 Units Subcutaneous QHS Opyd, Timothy S, MD   3 Units at 04/26/24 2310   insulin  aspart (novoLOG ) injection 2 Units  2 Units Subcutaneous TID WC Ezenduka, Nkeiruka J, MD   2 Units at 04/27/24 1225   insulin  glargine-yfgn (SEMGLEE ) injection 30 Units  30 Units Subcutaneous QHS Sheikh, Omair Latif, DO       ipratropium-albuterol  (DUONEB) 0.5-2.5 (3) MG/3ML nebulizer solution 3 mL  3 mL Nebulization Q6H PRN Ezenduka, Nkeiruka J, MD       oxyCODONE  (Oxy IR/ROXICODONE ) immediate release tablet 5-10 mg  5-10 mg Oral Q6H PRN Ezenduka, Nkeiruka J, MD   10  mg at 04/27/24 1225   pantoprazole  (PROTONIX ) EC tablet 40 mg  40 mg Oral BID AC Nandigam, Kavitha V, MD   40 mg at 04/27/24 9171   polyethylene glycol (MIRALAX  / GLYCOLAX ) packet 17 g  17 g Oral Daily Ezenduka, Nkeiruka J, MD   17 g at 04/26/24 1006   pregabalin  (LYRICA ) capsule 100 mg  100 mg Oral BID Georgina Basket, MD   100 mg at 04/27/24 9171   prochlorperazine  (COMPAZINE ) injection 5 mg  5 mg Intravenous Q6H PRN Opyd, Timothy S, MD       senna-docusate (Senokot-S) tablet 1 tablet  1 tablet Oral BID Ezenduka, Nkeiruka J, MD   1 tablet at 04/26/24 1006   sertraline  (ZOLOFT ) tablet 50 mg  50 mg Oral q AM Georgina Basket, MD   50 mg at 04/27/24 0531   tamsulosin  (FLOMAX ) capsule 0.4 mg  0.4 mg Oral QPC supper Georgina Basket, MD   0.4 mg at 04/26/24 1706   traZODone  (DESYREL ) tablet 75 mg  75 mg Oral QHS  Georgina Basket, MD   75 mg at 04/26/24 2311     Discharge Medications: Please see discharge summary for a list of discharge medications.  Relevant Imaging Results:  Relevant Lab Results:   Additional Information [id number]  Isaiah Public, LCSWA

## 2024-04-27 NOTE — Inpatient Diabetes Management (Signed)
 Inpatient Diabetes Program Recommendations  AACE/ADA: New Consensus Statement on Inpatient Glycemic Control (2015)  Target Ranges:  Prepandial:   less than 140 mg/dL      Peak postprandial:   less than 180 mg/dL (1-2 hours)      Critically ill patients:  140 - 180 mg/dL   Lab Results  Component Value Date   GLUCAP 211 (H) 04/27/2024   HGBA1C 9.1 (H) 04/20/2024    Review of Glycemic Control  Latest Reference Range & Units 04/26/24 08:31 04/26/24 12:11 04/26/24 16:02 04/26/24 21:26 04/27/24 07:42  Glucose-Capillary 70 - 99 mg/dL 776 (H) 678 (H) 694 (H) 257 (H) 211 (H)  (H): Data is abnormally high  Diabetes type 2 Outpatient Diabetes medications:  Trulicity  0.5 mg weekly Glipizide  10 mg QAM Humalog 2-8 units TID Farxiga  10 mg every day  Current orders for Inpatient glycemic control:  Semglee  24 units qhs Novolog  0-15 units tid Novolog  2 units tid meal coverage    Inpatient Diabetes Program Recommendations:    Semglee  30 units every day Novolog  4 units TID with meals   Thank you, Brett Pinal, MSN, CDCES Diabetes Coordinator Inpatient Diabetes Program [phone] (team pager from 8a-5p)

## 2024-04-27 NOTE — TOC Transition Note (Addendum)
 Transition of Care [facility]) - Discharge Note   Patient Details  Name: Brett White MRN: [id number] Date of Birth: 1961/01/24  Transition of [facility]) CM/SW Contact:  Isaiah Public, LCSWA Phone Number: 04/27/2024, 3:10 PM   Clinical Narrative:     Patient will DC to: [facility]   Anticipated DC date: 04/27/2024  Family notified: patient gave CSW permission to LVM for [employer] by: [place]  ?  Per MD patient ready for DC to [facility] . RN, patient, family ,and facility notified of DC. Discharge Summary sent to facility. RN given number for report [phone] RM# [id number]. DC packet on chart. Ambulance transport requested for patient.  CSW signing off.   Final next level of care: Long Term Nursing Home Barriers to Discharge: No Barriers Identified   Patient Goals and CMS Choice Patient states their goals for this hospitalization and ongoing recovery are:: SNF   Choice offered to / list presented to : Patient      Discharge Placement    Patient chooses bed at: [facility] Patient to be transferred to facility by: [facility] Name of family member notified: Rosaline Patient  notified of of transfer: 04/27/24   Discharge Plan and Services Additional resources added to the After Visit Summary for   In-house Referral: Clinical Social Work   Post Acute Care Choice: Nursing Home                               Social Drivers of Health (SDOH) Interventions SDOH Screenings   Food Insecurity: No Food Insecurity (04/20/2024)  Housing: Low Risk  (04/20/2024)  Transportation Needs: No Transportation Needs (04/20/2024)  Utilities: Not At Risk (04/20/2024)  Tobacco Use: Low Risk  (04/24/2024)     Readmission Risk Interventions    04/21/2024    2:05 PM 04/13/2024    3:04 PM 11/16/2022    2:57 PM  Readmission Risk Prevention Plan  Transportation Screening Complete Complete Complete  Medication Review Furniture conservator/restorer) Complete Complete Complete  PCP or Specialist appointment within 3-5 days of discharge Complete Complete Complete  HRI or Home Care Consult Complete Complete Complete  SW Recovery Care/Counseling Consult Complete Complete Complete  Palliative Care Screening Not Applicable Complete Complete  Skilled Nursing Facility Complete Complete Complete

## 2024-04-27 NOTE — Discharge Summary (Signed)
 Physician Discharge Summary   Patient: Brett White MRN: [id number] DOB: 05/10/1961  Admit date:     04/20/2024  Discharge date: 04/27/24  Discharge Physician: Alejandro Marker, DO   PCP: [facility]   Recommendations at discharge:   Follow-up with PCP within 1 to 2 weeks repeat CBC, CMP, mag, Phos within 1 week Repeat chest x-ray in 3 to 6 weeks and continue with aspiration precautions Follow-up with gastroenterology in outpatient setting and follow-up on biopsy results  Discharge Diagnoses: Principal Problem:   Acute hypoxemic respiratory failure (HCC)  Resolved Problems:   * No resolved hospital problems. *  Hospital Course: Brett White is a 63 y.o. 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  Patient is from [facility], facility stated patient was around 78% on room air.  Patient he is not on oxygen. In the ED, pt tachycardic. Labs notable for glucose 368. CXR w/o focal consolidations. CT chest w/ bilateral pleural effusions.  Of note, patient has multiple frequent hospitalization and concern for significant drug-seeking behavior. Patient admitted for further management.    On 04/21/24 patient reported that his pain is all over his body, denies any specific chest pain or abdominal pain.  Laying in bed, appearing comfortable.  Currently on 2 L of oxygen, saturating well  Esophagram showed severe esophageal dysmotility and SLP employing strategies to implement given his reduced clearance.  Consulted GI for further evaluation and take the patient for EGD which showed no endoscopic esophageal abnormality to explain the dysphagia but his esophagus was dilated.  The patient thinks that his swallowing is easier.  He is improving he is medically stable for discharge and will need to follow-up with PCP in gastrology outpatient setting.  Assessment and Plan:  Chest pain and this could be likely in the  setting of esophageal dysmotility: Denies any current left-sided chest pain and continues to complain that his left lung pain.. Troponins negative. EKG with sinus tachycardia, no acute ST changes. Last echo done on 04/15/24, showed EF of 55 to 60%, no regional wall motion abnormality, left ventricular diastolic parameters are indeterminate. CTM on Telemetry.  Likely in the setting of his esophageal dysmotility and pneumonia.  If persists can be followed in outpatient setting   Acute Hypoxic Respiratory Failure in the setting of Likely aspiration event/pneumonia from Esophageal Dysmotility and Unlikely acute HFpEF exacerbation -Oxygen sats dropped to 78% on room air with recovery, possibly an aspiration event (if accurate); SpO2: 92 %; O2 Flow Rate (L/min): 2 L/min; Now weaned off -Currently afebrile, with no leukocytosis -BNP 28, trended downwards from 163  -CXR w/o focal consolidations -CT chest with b/l tiny pleural effusions, pulmonary artery hypertension, noted dilated esophagus with food/debris's within, suggest possibly esophageal dysmotility/GERD; see below -Discontinue IV Lasix  for now and resume home p.o. Lasix  now -Esophagram done and even thought it was a limited study it showed Significant esophageal dysmotility with associated diffuse esophageal distension, stasis, and tertiary contractions. The Barium tablet lodged in distal esophagus near GE junction but no underlying severe stricture or obstruction appreciated fluoroscopically.  -Consulted GI because of this and they are planning on EGD on 04/24/24 and this showed LA Grade B esophagitis w/ no bleeding and no endoscopic esophageal abnormality to explain the patient's dysphagia however his esophagus was dilated.  Notably he was also found to have a 2 cm hiatal hernia and a normal stomach and normal examined duodenum and biopsies were  taken with cold forceps -SLP evaluation being done and gave him precautions.  -Covered with IV Unasyn  and will  switch to p.o. Augmentin  to complete 7 days -Continue DuoNebs -Supplemental O2 as needed, and was weaned off of supplemental oxygen for now -Repeat CXR done today and showed no active disease; Improved and medically stable for D/C   Esophageal Dysmotolity and Dysphagia: see Above   Transaminitis/ Abnormal LFTs, Resolved -Alkaline phosphatase and T. bili WNL. AST and ALT have normalized. Right upper quadrant ultrasound showed normal hepatobiliary; Hepatitis panel Negative  -CTM and Trend intermittently    Chronic Diastolic HF, ruled out Acute Exacerbation: Appears euvolemic. 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  Continue SSI with moderate NovoLog /scale insulin  ACHS, and continue long-acting Lantus  insulin  glargine 24 units subcu nightly and continue insulin  aspart 2 units SQ 3 times daily with meals, Accu-Cheks, hypoglycemic protocol On Farxiga , glipizide , Trulicity . CTM CBGs per Protocol. CBG Trend ranging from 211-305  Renal Insuffiencey: BUN/Cr Trend improved: Recent Labs  Lab 04/18/24 0324 04/20/24 0920 04/22/24 0508 04/23/24 0435 04/24/24 0540 04/25/24 0248 04/26/24 0501 04/27/24 0536  BUN 27* 22* 36* 31* 22* 20 19  --   CREATININE 0.96 1.13 1.38* 1.16 1.06 1.21 1.11 1.10  -Avoid Nephrotoxic Medications, Contrast Dyes, Hypotension and Dehydration to Ensure Adequate Renal Perfusion and will need to Renally Adjust Meds -Continue to Monitor and Trend Renal Function carefully and repeat CMP in the AM   Chronic pain: PTA Lyrica  100mg  BID, reduced dose Dilaudid  2mg  q6h (4mg  q6h pta), and duloxetine  60mg  daily. Resume po Diluadid for now and give a short course of Oxycodone  IR for Moderate Pain   Essential HTN: C/w PTA amlodipine  10mg  daily, hold losartan  for now. CTM BP per Protocol. Last BP reading was 117/76.     GERD/GI Prophylaxis: C/w PTA PPI and now on BID dosing   Ascending Aorta Dilation: Noted on recent Transthoracic  Echocardiogram measuring 42 mm, in addition to dilation of the aortic root measuring 43 mm.   Hypoalbuminemia: Patient's Albumin  Trend ranging from 2.3-3.4. (2.6 on last check) CTM and Trend and repeat CMP in the AM  Class I Obesity: Complicates overall prognosis and care. Estimated body mass index is 33.26 kg/m as calculated from the following:   Height as of this encounter: 6' 2 (1.88 m).   Weight as of this encounter: 117.5 kg. Weight Loss and Dietary Counseling given  Consultants: Gastroenterology Procedures performed: As delineated as above  Disposition: Skilled nursing facility Diet recommendation:  Discharge Diet Orders (From admission, onward)     Start     Ordered   04/27/24 0000  Diet - low sodium heart healthy       Comments: SOFT DIET   04/27/24 1417   04/27/24 0000  Diet Carb Modified        04/27/24 1417           Cardiac and Carb modified diet DISCHARGE MEDICATION: Allergies as of 04/27/2024   No Known Allergies      Medication List     TAKE these medications    acetaminophen  325 MG tablet Commonly known as: TYLENOL  Take 2 tablets (650 mg total) by mouth every 6 (six) hours as needed for mild pain (pain score 1-3), fever or headache.   amLODipine  10 MG tablet Commonly known as: NORVASC  Take 10 mg by mouth every morning.   amoxicillin -clavulanate 875-125 MG tablet Commonly known as: AUGMENTIN  Take 1 tablet  by mouth every 12 (twelve) hours for 3 doses.   artificial tears ophthalmic solution Place 1 drop into the left eye 4 (four) times daily.   atorvastatin  40 MG tablet Commonly known as: LIPITOR Take 40 mg by mouth at bedtime.   brimonidine  0.2 % ophthalmic solution Commonly known as: ALPHAGAN  Place 1 drop into the left eye in the morning and at bedtime.   carboxymethylcellulose 1 % ophthalmic solution Place 4 drops into the left eye in the morning, at noon, in the evening, and at bedtime.   DULoxetine  60 MG capsule Commonly known as:  CYMBALTA  Take 60 mg by mouth in the morning.   ergocalciferol  1.25 MG (50000 UT) capsule Commonly known as: VITAMIN D2 Take 50,000 Units by mouth once a week. Fridays   famotidine  20 MG tablet Commonly known as: PEPCID  Take 20 mg by mouth in the morning.   Farxiga  10 MG Tabs tablet Generic drug: dapagliflozin  propanediol Take 10 mg by mouth every morning. Dapaglifozin   feeding supplement Liqd Take 237 mLs by mouth 2 (two) times daily between meals.   furosemide  20 MG tablet Commonly known as: LASIX  Take 20 mg by mouth every morning.   glipiZIDE  10 MG tablet Commonly known as: GLUCOTROL  Take 10 mg by mouth every morning.   HYDROmorphone  4 MG tablet Commonly known as: DILAUDID  Take 1 tablet (4 mg total) by mouth every 6 (six) hours as needed for severe pain (pain score 7-10). What changed: reasons to take this   ipratropium-albuterol  0.5-2.5 (3) MG/3ML Soln Commonly known as: DUONEB Take 3 mLs by nebulization every 6 (six) hours as needed.   Linzess  145 MCG Caps capsule Generic drug: linaclotide  Take 145 mcg by mouth in the morning.   losartan  100 MG tablet Commonly known as: COZAAR  Take 0.5 tablets (50 mg total) by mouth in the morning.   Melatonin 5 MG Caps Take 10 mg by mouth at bedtime.   oxyCODONE  5 MG immediate release tablet Commonly known as: Oxy IR/ROXICODONE  Take 1 tablet (5 mg total) by mouth every 6 (six) hours as needed for moderate pain (pain score 4-6) or breakthrough pain.   pantoprazole  40 MG tablet Commonly known as: PROTONIX  Take 1 tablet (40 mg total) by mouth 2 (two) times daily before a meal. What changed: when to take this   polyethylene glycol 17 g packet Commonly known as: MIRALAX  / GLYCOLAX  Take 17 g by mouth daily.   pregabalin  100 MG capsule Commonly known as: LYRICA  Take 1 capsule (100 mg total) by mouth 2 (two) times daily.   senna-docusate 8.6-50 MG tablet Commonly known as: Senokot-S Take 1 tablet by mouth 2 (two) times  daily.   sertraline  50 MG tablet Commonly known as: ZOLOFT  Take 50 mg by mouth in the morning.   tamsulosin  0.4 MG Caps capsule Commonly known as: FLOMAX  Take 1 capsule (0.4 mg total) by mouth daily after supper.   traZODone  150 MG tablet Commonly known as: DESYREL  Take 75 mg by mouth at bedtime.   Trulicity  1.5 MG/0.5ML Soaj Generic drug: Dulaglutide  Inject 0.5 mg into the skin once a week. On Thursdays       Discharge Exam: Filed Weights   04/20/24 9082 04/20/24 1819  Weight: 127.2 kg 117.5 kg   Vitals:   04/27/24 0745 04/27/24 1124  BP: 137/86 117/76  Pulse: 86 90  Resp: 18 16  Temp: 98 F (36.7 C) 98.3 F (36.8 C)  SpO2: 90% 93%   Examination: Physical Exam:  Constitutional: WN/WD  chronically ill-appearing Caucasian male in no acute distress Respiratory: Diminished to auscultation bilaterally with some coarse breath sounds, no wheezing, rales, rhonchi or crackles. Normal respiratory effort and patient is not tachypenic. No accessory muscle use.  Not wearing any supplemental oxygen nasal cannula Cardiovascular: RRR, no murmurs / rubs / gallops. S1 and S2 auscultated.  Mild lower extremity edema on the right but no appreciable edema on the left given that his left BKA Abdomen: Soft, non-tender, patient secondary to body habitus.  Bowel sounds positive.  GU: Deferred. 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 No induration; Warm and dry.  Neurologic: CN 2-12 grossly intact with no focal deficits. Romberg sign and cerebellar reflexes not assessed.  Psychiatric: Normal judgment and insight. Alert and oriented x 3. Normal mood and appropriate affect.   Condition at discharge: stable  The results of significant diagnostics from this hospitalization (including imaging, microbiology, ancillary and laboratory) are listed below for reference.   Imaging Studies: DG CHEST PORT 1 VIEW Result Date: 04/26/2024 CLINICAL  DATA:  Shortness of breath. EXAM: PORTABLE CHEST 1 VIEW COMPARISON:  Chest radiograph dated 04/23/2024. FINDINGS: No focal consolidation, pleural effusion or pneumothorax. The cardiac silhouette is within normal limits. No acute osseous pathology. IMPRESSION: No active disease. Electronically Signed   By: Vanetta Chou M.D.   On: 04/26/2024 10:59   DG CHEST PORT 1 VIEW Result Date: 04/23/2024 CLINICAL DATA:  Short of breath EXAM: PORTABLE CHEST 1 VIEW COMPARISON:  None Available. FINDINGS: Normal mediastinum and cardiac silhouette. Normal pulmonary vasculature. No evidence of effusion, infiltrate, or pneumothorax. No acute bony abnormality. IMPRESSION: No acute cardiopulmonary process. Electronically Signed   By: Jackquline Boxer M.D.   On: 04/23/2024 09:30   DG ESOPHAGUS W SINGLE CM (SOL OR THIN BA) Result Date: 04/21/2024 CLINICAL DATA:  Dysphagia, choking sensation of food sticking in throat and lower chest. Chest CT demonstrates stasis and food contents within the esophagus from yesterday. EXAM: ESOPHAGUS/BARIUM SWALLOW/TABLET STUDY TECHNIQUE: Single contrast examination was performed using thin liquid barium. Study significantly limited secondary to patient immobility. This exam was performed by Kimble Clas, PA-C, and was supervised and interpreted by Dr. Vanice. FLUOROSCOPY: Radiation Exposure Index (as provided by the fluoroscopic device): 100.20 mGy Kerma COMPARISON:  Please reference speech pathology modified barium swallow study on 11/25/23 FINDINGS: Swallowing: Unable to assess due to limited study Pharynx: Unable to assess due to limited study. Esophagus: Normal appearance. Esophageal motility: Significant esophageal dysmotility throughout proximal, mid, and distal esophagus with diffuse esophageal distension and stasis. Tertiary contractions throughout. Hiatal Hernia: None. Gastroesophageal reflux: None visualized. Ingested 13 mm barium tablet: Became lodged in distal esophagus and at GE  junction. Not visualized passing into the stomach after more than 6 minutes of observing and multiple sips of water  and thin barium. Despite this, no underlying severe stricture or obstruction appreciated. Other: None. IMPRESSION: Limited study. Significant esophageal dysmotility with associated diffuse esophageal distension, stasis, and tertiary contractions. Barium tablet lodged in distal esophagus near GE junction but no underlying severe stricture or obstruction appreciated fluoroscopically. Electronically Signed   By: CHRISTELLA.  Shick M.D.   On: 04/21/2024 14:54   CT CHEST WO CONTRAST Result Date: 04/20/2024 EXAM: CT CHEST WITHOUT CONTRAST 04/20/2024 03:21:24 PM TECHNIQUE: CT of the chest was performed without the administration of intravenous contrast. Multiplanar reformatted images are provided for review. Automated exposure control, iterative reconstruction, and/or weight based adjustment of the mA/kV was utilized to reduce the radiation dose to  as low as reasonably achievable. COMPARISON: Chest radiograph of earlier today. CLINICAL HISTORY: Dyspnea, chronic, chest wall or pleura disease suspected. Pt BIB EMS from [facility] for chest pain, states its more like pressure on his chest, and SOB for several days. Facility said room air 78%. Given 3 nitroglycerin  at the facility. EMS gave 1 nitroglycerin , 324 ASA, 5 albulterol. Not normally on oxygen. Hx of pneumonia. Aox4. FINDINGS: MEDIASTINUM: Mild cardiomegaly. Pulmonary artery enlargement, outflow tract 3.2 cm. Moderate esophageal dilatation with food or debris within, including on 75/3. LYMPH NODES: No thoracic adenopathy. LUNGS AND PLEURA: Minimal dependent subsegmental atelectasis in both lung bases. Tiny bilateral pleural effusions. SOFT TISSUES/BONES: Aortic atherosclerosis. LAD coronary artery calcification. UPPER ABDOMEN: Limited images of the upper abdomen demonstrates no acute abnormality. IMPRESSION: 1. No acute findings. 2. Tiny bilateral pleural  effusions 3. Aortic and coronary artery atherosclerosis 4.  Pulmonary artery enlargement suggests pulmonary arterial hypertension. 5. Dilated esophagus with food/debris within. This suggests esophageal dysmotility and/or gastroesophageal reflux. Electronically signed by: Rockey Kilts MD 04/20/2024 03:35 PM EDT RP Workstation: [id number]   US  Abdomen Limited RUQ (LIVER/GB) Result Date: 04/20/2024 CLINICAL DATA:  Upper abdominal pain EXAM: ULTRASOUND ABDOMEN LIMITED RIGHT UPPER QUADRANT COMPARISON:  None Available. FINDINGS: Gallbladder: No gallstones or wall thickening visualized. No sonographic Murphy sign noted by sonographer. Common bile duct: Diameter: 0.3 cm Liver: No focal lesion identified. Within normal limits in parenchymal echogenicity. Portal vein is patent on color Doppler imaging with normal direction of blood flow towards the liver. Other: None. IMPRESSION: 1. Normal hepatobiliary ultrasound. Electronically Signed   By: Ryan Salvage M.D.   On: 04/20/2024 14:42   DG Chest Portable 1 View Result Date: 04/20/2024 CLINICAL DATA:  Chest pain and shortness of breath EXAM: PORTABLE CHEST 1 VIEW COMPARISON:  Chest radiograph dated 04/15/2024 FINDINGS: Low lung volumes with bronchovascular crowding. No focal consolidations. No pleural effusion or pneumothorax. The heart size and mediastinal contours are within normal limits. No acute osseous abnormality. IMPRESSION: Low lung volumes with bronchovascular crowding. No focal consolidations. Electronically Signed   By: Limin  Xu M.D.   On: 04/20/2024 09:41   CT RENAL STONE STUDY Result Date: 04/17/2024 CLINICAL DATA:  Right flank pain. EXAM: CT ABDOMEN AND PELVIS WITHOUT CONTRAST TECHNIQUE: Multidetector CT imaging of the abdomen and pelvis was performed following the standard protocol without IV contrast. RADIATION DOSE REDUCTION: This exam was performed according to the departmental dose-optimization program which includes automated exposure control,  adjustment of the mA and/or kV according to patient size and/or use of iterative reconstruction technique. COMPARISON:  Renal ultrasound 04/15/2024 and CT abdomen pelvis 03/28/2024. FINDINGS: Lower chest: Dependent and scattered subsegmental atelectasis. Small bilateral pleural effusions. Heart is enlarged. No pericardial effusion. Distal esophagus is grossly unremarkable. Hepatobiliary: Liver and gallbladder are unremarkable. No biliary ductal dilatation. Pancreas: Negative. Spleen: Negative. Adrenals/Urinary Tract: Adrenal glands are unremarkable. Bilateral renal stones. Small low-attenuation lesion in the left kidney. No specific follow-up necessary. Increased bilateral perinephric edema with new stranding along the mid/distal ureters bilaterally. Ureters are decompressed. Bladder is grossly unremarkable. Stomach/Bowel: Stomach and small bowel are unremarkable. Appendix is not readily visualized. Stool throughout the colon. Vascular/Lymphatic: Atherosclerotic calcification of the aorta. No pathologically enlarged lymph nodes. Reproductive: Prostate is visualized. Other: No free fluid. Tiny umbilical hernia contains fat. Mesenteries and peritoneum are otherwise unremarkable. Musculoskeletal: Degenerative changes in the spine. Interbody cages at L3-4 and L4-5. IMPRESSION: 1. Mild perinephric edema with mild stranding along the mid/distal ureters, new from 03/28/2024. No ureteral or  bladder stones nor hydronephrosis. Please correlate with laboratory values for the possibility of pyelonephritis. 2. Bilateral renal stones. 3. Small bilateral pleural effusions with dependent and scattered subsegmental atelectasis. 4.  Aortic atherosclerosis (ICD10-I70.0). Electronically Signed   By: Newell Eke M.D.   On: 04/17/2024 09:08   ECHOCARDIOGRAM COMPLETE Result Date: 04/16/2024    ECHOCARDIOGRAM REPORT   Patient Name:   JUSTINIAN MIANO Date of Exam: 04/16/2024 Medical Rec #:  [id number]      Height:       74.0 in  Accession #:    [id number]     Weight:       280.6 lb Date of Birth:  08/07/1961      BSA:          2.510 m Patient Age:    62 years       BP:           86/59 mmHg Patient Gender: M              HR:           88 bpm. Exam Location:  Inpatient Procedure: 2D Echo, Cardiac Doppler and Color Doppler (Both Spectral and Color            Flow Doppler were utilized during procedure). Indications:    I50.40* Unspecified combined systolic (congestive) and diastolic                 (congestive) heart failure  History:        Patient has no prior history of Echocardiogram examinations.                 Abnormal ECG, Arrythmias:Tachycardia, Signs/Symptoms:Dyspnea and                 Shortness of Breath; Risk Factors:Hypertension, Dyslipidemia and                 Diabetes.  Sonographer:    Ellouise Mose RDCS Referring Phys: [phone] RALPH A NETTEY  Sonographer Comments: Technically difficult study due to poor echo windows and patient is obese. Image acquisition challenging due to patient body habitus. IMPRESSIONS  1. Left ventricular ejection fraction, by estimation, is 55 to 60%. The left ventricle has normal function. The left ventricle has no regional wall motion abnormalities. Left ventricular diastolic parameters are indeterminate.  2. Right ventricular systolic function is low normal. The right ventricular size is moderately enlarged. Tricuspid regurgitation signal is inadequate for assessing PA pressure.  3. The mitral valve is normal in structure. Trivial mitral valve regurgitation. No evidence of mitral stenosis.  4. The aortic valve is tricuspid. Aortic valve regurgitation is not visualized. No aortic stenosis is present.  5. Aortic dilatation noted. There is dilatation of the ascending aorta, measuring 42 mm. There is dilatation of the aortic root, measuring 43 mm.  6. The inferior vena cava is dilated in size with <50% respiratory variability, suggesting right atrial pressure of 15 mmHg. Comparison(s): No prior Echocardiogram.  FINDINGS  Left Ventricle: Left ventricular ejection fraction, by estimation, is 55 to 60%. The left ventricle has normal function. The left ventricle has no regional wall motion abnormalities. The left ventricular internal cavity size was normal in size. There is  no left ventricular hypertrophy. Left ventricular diastolic parameters are indeterminate. Right Ventricle: The right ventricular size is moderately enlarged. No increase in right ventricular wall thickness. Right ventricular systolic function is low normal. Tricuspid regurgitation signal is inadequate for assessing PA pressure. Left Atrium: Left atrial  size was normal in size. Right Atrium: Right atrial size was normal in size. Pericardium: There is no evidence of pericardial effusion. Mitral Valve: The mitral valve is normal in structure. Trivial mitral valve regurgitation. No evidence of mitral valve stenosis. Tricuspid Valve: The tricuspid valve is normal in structure. Tricuspid valve regurgitation is trivial. No evidence of tricuspid stenosis. Aortic Valve: The aortic valve is tricuspid. Aortic valve regurgitation is not visualized. No aortic stenosis is present. Pulmonic Valve: The pulmonic valve was normal in structure. Pulmonic valve regurgitation is not visualized. No evidence of pulmonic stenosis. Aorta: Aortic dilatation noted. There is dilatation of the ascending aorta, measuring 42 mm. There is dilatation of the aortic root, measuring 43 mm. Venous: The inferior vena cava is dilated in size with less than 50% respiratory variability, suggesting right atrial pressure of 15 mmHg. IAS/Shunts: There is right bowing of the interatrial septum, suggestive of elevated left atrial pressure. No atrial level shunt detected by color flow Doppler.  LEFT VENTRICLE PLAX 2D LVIDd:         5.00 cm      Diastology LVIDs:         3.70 cm      LV e' medial:    6.85 cm/s LV PW:         1.40 cm      LV E/e' medial:  13.6 LV IVS:        1.30 cm      LV e' lateral:    7.94 cm/s LVOT diam:     2.40 cm      LV E/e' lateral: 11.8 LV SV:         81 LV SV Index:   32 LVOT Area:     4.52 cm  LV Volumes (MOD) LV vol d, MOD A2C: 81.7 ml LV vol d, MOD A4C: 100.0 ml LV vol s, MOD A2C: 37.1 ml LV vol s, MOD A4C: 46.5 ml LV SV MOD A2C:     44.6 ml LV SV MOD A4C:     100.0 ml LV SV MOD BP:      48.5 ml RIGHT VENTRICLE             IVC RV S prime:     12.24 cm/s  IVC diam: 2.40 cm TAPSE (M-mode): 1.7 cm LEFT ATRIUM             Index       RIGHT ATRIUM           Index LA diam:        2.80 cm 1.12 cm/m  RA Area:     13.70 cm LA Vol (A2C):   22.0 ml 8.76 ml/m  RA Volume:   29.00 ml  11.55 ml/m LA Vol (A4C):   23.0 ml 9.16 ml/m LA Biplane Vol: 23.8 ml 9.48 ml/m  AORTIC VALVE LVOT Vmax:   99.20 cm/s LVOT Vmean:  62.400 cm/s LVOT VTI:    0.179 m  AORTA Ao Root diam: 4.30 cm Ao Asc diam:  4.10 cm MITRAL VALVE MV Area (PHT): 4.30 cm    SHUNTS MV Decel Time: 177 msec    Systemic VTI:  0.18 m MV E velocity: 93.45 cm/s  Systemic Diam: 2.40 cm MV A velocity: 97.40 cm/s MV E/A ratio:  0.96 Morene Brownie Electronically signed by Morene Brownie Signature Date/Time: 04/16/2024/3:35:42 PM    Final    US  RENAL Result Date: 04/15/2024 CLINICAL DATA:  Acute kidney insufficiency EXAM: RENAL / URINARY  TRACT ULTRASOUND COMPLETE COMPARISON:  CT scan 04/11/2024 FINDINGS: Right Kidney: Renal measurements: 13.7 x 6.6 x 6.3 cm = volume: 298.1 mL. Mild parenchymal atrophy. No collecting system dilatation or perinephric fluid. Shadowing lower pole right-sided renal stone measuring 19 mm. Left Kidney: Renal measurements: 13.8 x 7.2 x 6.4 cm = volume: 330.3 mL. Mild parenchymal atrophy. No collecting system dilatation or perinephric fluid. There is a central cystic area measuring 3 cm which was seen on the prior CT scan as a parapelvic cyst. Bladder: Appears normal for degree of bladder distention. Other: Limited by overlapping bowel gas and soft tissue. IMPRESSION: Bilateral renal atrophy without collecting  system dilatation. Left-sided parapelvic renal cyst as seen on prior CT. Right-sided renal stone. Electronically Signed   By: Ranell Bring M.D.   On: 04/15/2024 17:10   DG Chest 2 View Result Date: 04/15/2024 CLINICAL DATA:  [id number] Acute respiratory failure with hypoxia (HCC) [id number] EXAM: CHEST - 2 VIEW COMPARISON:  None available. FINDINGS: Diffuse interstitial opacities throughout both lungs. No focal airspace consolidation or pneumothorax. Small bilateral pleural effusions. Moderate cardiomegaly. Tortuous aorta with aortic atherosclerosis. No acute fracture or destructive lesions. Multilevel thoracic osteophytosis. Right PICC terminates at the cavoatrial junction. IMPRESSION: Moderate cardiomegaly with similar findings of interstitial edema. Small bilateral pleural effusions. Electronically Signed   By: Rogelia Myers M.D.   On: 04/15/2024 11:42   DG CHEST PORT 1 VIEW Result Date: 04/14/2024 CLINICAL DATA:  Dyspnea EXAM: PORTABLE CHEST 1 VIEW COMPARISON:  Chest radiograph dated 04/11/2024 FINDINGS: Right upper extremity PICC tip projects over the SVC. Mildly low lung volumes. Increased diffuse interstitial opacities. No pleural effusion or pneumothorax. Similar mildly enlarged cardiomediastinal silhouette. No acute osseous abnormality. IMPRESSION: Increased pulmonary edema. Electronically Signed   By: Limin  Xu M.D.   On: 04/14/2024 12:07   NM Pulmonary Perfusion Result Date: 04/13/2024 CLINICAL DATA:  Pulmonary embolism (PE) suspected, high prob EXAM: NUCLEAR MEDICINE PERFUSION LUNG SCAN TECHNIQUE: Perfusion images were obtained in multiple projections after intravenous injection of radiopharmaceutical. No ventilation imaging performed. RADIOPHARMACEUTICALS:  4.38 mCi Tc-61m MAA IV COMPARISON:  Noncontrast chest CT 04/11/2024. Chest radiographs 04/11/2024. FINDINGS: There are no wedge-shaped perfusion defects to suggest acute pulmonary embolism. Pulmonary perfusion within normal limits. IMPRESSION: No  evidence of acute pulmonary embolism on perfusion scintigraphy by PISAPED criteria. Electronically Signed   By: Elsie Perone M.D.   On: 04/13/2024 14:36   US  EKG SITE RITE Result Date: 04/12/2024 If Site Rite image not attached, placement could not be confirmed due to current cardiac rhythm.  CT CHEST ABDOMEN PELVIS WO CONTRAST Result Date: 04/11/2024 CLINICAL DATA:  Sepsis EXAM: CT CHEST, ABDOMEN AND PELVIS WITHOUT CONTRAST TECHNIQUE: Multidetector CT imaging of the chest, abdomen and pelvis was performed following the standard protocol without IV contrast. RADIATION DOSE REDUCTION: This exam was performed according to the departmental dose-optimization program which includes automated exposure control, adjustment of the mA and/or kV according to patient size and/or use of iterative reconstruction technique. COMPARISON:  Chest x-ray from earlier in the same day, CT from 03/28/2024. FINDINGS: CT CHEST FINDINGS Cardiovascular: Somewhat limited due to lack of IV contrast. Thoracic aorta shows no aneurysmal dilatation. Mild atherosclerotic calcifications are seen. Pulmonary artery is not significantly enlarged. The heart is within normal limits. No significant coronary calcifications are noted. Mediastinum/Nodes: Thoracic inlet is within normal limits. No hilar or mediastinal adenopathy is noted. The esophagus as visualized is within normal limits. Lungs/Pleura: Mild bibasilar atelectatic changes are seen. No sizable effusion is noted.  No parenchymal nodules are seen. Musculoskeletal: No chest wall mass or suspicious bone lesions identified. CT ABDOMEN PELVIS FINDINGS Hepatobiliary: Liver and gallbladder are within normal limits. Pancreas: Unremarkable. No pancreatic ductal dilatation or surrounding inflammatory changes. Spleen: Normal in size without focal abnormality. Adrenals/Urinary Tract: Adrenal glands are within normal limits. Kidneys demonstrate bilateral perinephric stranding similar to that seen on  the prior exam. Simple renal cyst is noted centrally in the left kidney. No follow-up is recommended. Scattered nonobstructing stones are noted on the right the largest of which measures 10 mm. The ureters are within normal limits. No obstructive changes are seen. Hyperdense cyst is again noted in the right kidney inferiorly. No follow-up is recommended. The bladder is partially distended. Stomach/Bowel: No obstructive or inflammatory changes of the colon are noted. The appendix is not visualized consistent with a prior surgical history. Stomach and small bowel are within normal limits. Vascular/Lymphatic: Aortic atherosclerosis. No enlarged abdominal or pelvic lymph nodes. Reproductive: Prostate is unremarkable. Other: Small fat containing umbilical hernia is noted. No abdominopelvic ascites. Musculoskeletal: Postsurgical changes are noted in the lower lumbar spine. IMPRESSION: CT of the chest: Bibasilar atelectasis without sizable effusion. CT of the abdomen and pelvis: Nonobstructing right renal calculi similar to that seen on the prior exam. No obstructive changes are seen. Electronically Signed   By: Oneil Devonshire M.D.   On: 04/11/2024 20:04   DG Chest Port 1 View Result Date: 04/11/2024 CLINICAL DATA:  Nausea, vomiting and diarrhea 3 days. Fell today. Questionable sepsis. EXAM: PORTABLE CHEST 1 VIEW COMPARISON:  Chest x-ray 03/24/2024 FINDINGS: The cardiac silhouette, mediastinal and hilar contours are within normal limits and stable given the AP projection, portable technique and low lung volumes. Low lung volumes with vascular crowding and bibasilar atelectasis. No definite infiltrates, effusions or pneumothorax. IMPRESSION: Low lung volumes with vascular crowding and bibasilar atelectasis. Electronically Signed   By: MYRTIS Stammer M.D.   On: 04/11/2024 18:26   Microbiology: Results for orders placed or performed during the hospital encounter of 04/11/24  Urine Culture     Status: Abnormal    Collection Time: 04/11/24  3:31 PM   Specimen: Urine, Random  Result Value Ref Range Status   Specimen Description   Final    URINE, RANDOM Performed at [facility], [address], [place], [place] [address]    Special Requests   Final    NONE Reflexed from [id number] Performed at [facility], [address], [place], [place] [address]    Culture (A)  Final    80,000 COLONIES/mL ESCHERICHIA COLI Confirmed Extended Spectrum Beta-Lactamase Producer (ESBL).  In bloodstream infections from ESBL organisms, carbapenems are preferred over piperacillin /tazobactam. They are shown to have a lower risk of mortality.    Report Status 04/14/2024 FINAL  Final   Organism ID, Bacteria ESCHERICHIA COLI (A)  Final      Susceptibility   Escherichia coli - MIC*    AMPICILLIN  >=32 RESISTANT Resistant     CEFAZOLIN  >=64 RESISTANT Resistant     CEFEPIME  >=32 RESISTANT Resistant     CEFTRIAXONE  >=64 RESISTANT Resistant     CIPROFLOXACIN >=4 RESISTANT Resistant     GENTAMICIN  <=1 SENSITIVE Sensitive     IMIPENEM <=0.25 SENSITIVE Sensitive     NITROFURANTOIN <=16 SENSITIVE Sensitive     TRIMETH /SULFA  >=320 RESISTANT Resistant     AMPICILLIN /SULBACTAM >=32 RESISTANT Resistant     PIP/TAZO 8 SENSITIVE Sensitive ug/mL    * 80,000 COLONIES/mL ESCHERICHIA COLI  Blood Culture (routine x 2)     Status: None   Collection Time: 04/11/24  5:40 PM   Specimen: BLOOD LEFT FOREARM  Result Value Ref Range Status   Specimen Description   Final    BLOOD LEFT FOREARM Performed at [facility], [address] [place], [place] [address]    Special Requests   Final    BOTTLES DRAWN AEROBIC AND ANAEROBIC Blood Culture results may not be optimal due to an inadequate volume of blood received in culture bottles Performed at [facility], [address], [place], [place] [address]    Culture   Final    NO GROWTH 5 DAYS Performed at [facility], [address] [place], [place] [address]    Report Status 04/16/2024 FINAL  Final  Blood Culture (routine x 2)     Status: None   Collection Time: 04/11/24  5:46 PM   Specimen: BLOOD LEFT HAND  Result Value Ref Range Status   Specimen Description BLOOD LEFT HAND  Final   Special Requests   Final    BOTTLES DRAWN AEROBIC AND ANAEROBIC Blood Culture adequate volume   Culture   Final    NO GROWTH 5 DAYS Performed at [facility], [address], [place], [place] [address]    Report Status 04/16/2024 FINAL  Final  Resp panel by RT-PCR (RSV, Flu A&B, Covid) Anterior Nasal Swab     Status: None   Collection Time: 04/11/24  6:50 PM   Specimen: Anterior Nasal Swab  Result Value Ref Range Status   SARS Coronavirus 2 by RT PCR NEGATIVE NEGATIVE Final    Comment: (NOTE) SARS-CoV-2 target nucleic acids are NOT DETECTED.  The SARS-CoV-2 RNA is generally detectable in upper respiratory specimens during the acute phase of infection. The lowest concentration of SARS-CoV-2 viral copies this assay can detect is 138 copies/mL. A negative result does not preclude SARS-Cov-2 infection and should not be used as the sole basis for treatment or other patient management decisions. A negative result may occur with  improper specimen collection/handling, submission of specimen other than nasopharyngeal swab, presence of viral mutation(s) within the areas targeted by this assay, and inadequate number of viral copies(<138 copies/mL). A negative result must be combined with clinical observations, patient history, and epidemiological information. The expected result is Negative.  Fact Sheet for Patients:  BloggerCourse.com  Fact Sheet for Healthcare Providers:  SeriousBroker.it  This test is no t yet approved or cleared by the [place]  FDA and  has been authorized for detection and/or diagnosis of SARS-CoV-2 by FDA under an Emergency Use Authorization  (EUA). This EUA will remain  in effect (meaning this test can be used) for the duration of the COVID-19 declaration under Section 564(b)(1) of the Act, 21 U.S.C.section 360bbb-3(b)(1), unless the authorization is terminated  or revoked sooner.       Influenza A by PCR NEGATIVE NEGATIVE Final   Influenza B by PCR NEGATIVE NEGATIVE Final    Comment: (NOTE) The Xpert Xpress SARS-CoV-2/FLU/RSV plus assay is intended as an aid in the diagnosis of influenza from Nasopharyngeal swab specimens and should not be used as a sole basis for treatment. Nasal washings and aspirates are unacceptable for Xpert Xpress SARS-CoV-2/FLU/RSV testing.  Fact Sheet for Patients: BloggerCourse.com  Fact Sheet for Healthcare Providers: SeriousBroker.it  This test is not yet approved or cleared by the [place]  FDA and has been authorized for detection and/or diagnosis of SARS-CoV-2  by FDA under an Emergency Use Authorization (EUA). This EUA will remain in effect (meaning this test can be used) for the duration of the COVID-19 declaration under Section 564(b)(1) of the Act, 21 U.S.C. section 360bbb-3(b)(1), unless the authorization is terminated or revoked.     Resp Syncytial Virus by PCR NEGATIVE NEGATIVE Final    Comment: (NOTE) Fact Sheet for Patients: BloggerCourse.com  Fact Sheet for Healthcare Providers: SeriousBroker.it  This test is not yet approved or cleared by the [place]  FDA and has been authorized for detection and/or diagnosis of SARS-CoV-2 by FDA under an Emergency Use Authorization (EUA). This EUA will remain in effect (meaning this test can be used) for the duration of the COVID-19 declaration under Section 564(b)(1) of the Act, 21 U.S.C. section 360bbb-3(b)(1), unless the authorization is terminated or revoked.  Performed at [facility], [address], [place], [place] [address]   MRSA Next Gen by PCR, Nasal     Status: Abnormal   Collection Time: 04/12/24 12:33 AM   Specimen: Nasal Mucosa; Nasal Swab  Result Value Ref Range Status   MRSA by PCR Next Gen DETECTED (A) NOT DETECTED Final    Comment: CRITICAL RESULT CALLED TO, READ BACK BY AND VERIFIED WITH: REXIE MANO RN AT 04/12/2024 0609 BY JEREMY C (NOTE) The GeneXpert MRSA Assay (FDA approved for NASAL specimens only), is one component of a comprehensive MRSA colonization surveillance program. It is not intended to diagnose MRSA infection nor to guide or monitor treatment for MRSA infections. Test performance is not FDA approved in patients less than 5 years old. Performed at [facility], [address] [place], [place] [address]    Labs: CBC: Recent Labs  Lab 04/22/24 [id number] 04/23/24 0435 04/24/24 0540 04/25/24 0248 04/26/24 0501  WBC 6.7 5.7 5.9 6.1 5.1  NEUTROABS 4.2 3.1 3.7 3.9 2.9  HGB 10.4* 10.2* 10.1* 9.5* 9.9*  HCT 34.0* 33.4* 33.1* 30.8* 32.2*  MCV 79.8* 80.1 79.8* 79.6* 79.7*  PLT 509* 480* 468* 436* 412*   Basic Metabolic Panel: Recent Labs  Lab 04/22/24 0508 04/23/24 0435 04/24/24 0540 04/25/24 0248 04/26/24 0501 04/27/24 0536  NA 134* 133* 137 136 137  --   K 4.7 4.5 4.6 4.5 4.6  --   CL 95* 98 101 104 102  --   CO2 30 28 28 26 23   --   GLUCOSE 329* 372* 186* 289* 207*  --   BUN 36* 31* 22* 20 19  --   CREATININE 1.38* 1.16 1.06 1.21 1.11 1.10  CALCIUM  8.9 8.6* 8.6* 8.3* 8.8*  --   MG  --  2.0 2.0 2.1 1.8  --   PHOS  --  4.0 3.1 3.7 3.6  --    Liver Function Tests: Recent Labs  Lab 04/22/24 0508 04/23/24 0435 04/24/24 0540 04/25/24 0248 04/26/24 0501  AST 56* 39 32 27 21  ALT 90* 70* 56* 46* 37  ALKPHOS 109 100 102 96 90  BILITOT 0.7 0.4 0.5 0.4 0.3  PROT 6.7 6.4* 6.4* 6.1* 6.1*  ALBUMIN  2.7* 2.6* 2.7* 2.6* 2.6*   CBG: Recent Labs  Lab 04/26/24 1211 04/26/24 1602 04/26/24 2126 04/27/24 0742  04/27/24 1121  GLUCAP 321* 305* 257* 211* 260*   Discharge time spent: greater than 30 minutes.  Signed: Alejandro Marker, DO [facility] 04/27/2024

## 2024-06-10 NOTE — Progress Notes (Unsigned)
 No chief complaint on file.  History of Present Illness: 63 yo male with history of GERD, HTN, DM, prior CVA, thoracic aortic aneurysm,PAD and CKD who is here today as a new patient for the evaluation of *** Echo July 2025 with LVEF=55-60%. No valve disease.   Primary Care Physician: [facility]   Past Medical History:  Diagnosis Date   CAP (community acquired pneumonia) 12/09/2021   CKD (chronic kidney disease)    GERD (gastroesophageal reflux disease)    HTN (hypertension)    Insulin  dependent type 2 diabetes mellitus (HCC)    Neuropathy    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   Stroke [facility])     Past Surgical History:  Procedure Laterality Date   ABDOMINAL AORTOGRAM W/LOWER EXTREMITY N/A 06/20/2023   Procedure: ABDOMINAL AORTOGRAM W/LOWER EXTREMITY;  Surgeon: Gretta Lonni PARAS, MD;  Location: [facility] INVASIVE [facility] LAB;  Service: Cardiovascular;  Laterality: N/A;   AMPUTATION Left 02/04/2021   Procedure: LEFT GREAT TOE AMPUTATION;  Surgeon: Harden Jerona GAILS, MD;  Location: [facility] OR;  Service: Orthopedics;  Laterality: Left;   AMPUTATION Left 02/10/2021   Procedure: LEFT FOOT 1ST RAY AMPUTATION;  Surgeon: Harden Jerona GAILS, MD;  Location: [facility] OR;  Service: Orthopedics;  Laterality: Left;   AMPUTATION Left 03/22/2021   Procedure: LEFT BELOW KNEE AMPUTATION;  Surgeon: Harden Jerona GAILS, MD;  Location: [facility] OR;  Service: Orthopedics;  Laterality: Left;   APPENDECTOMY     BACK SURGERY     CATARACT EXTRACTION     ESOPHAGOGASTRODUODENOSCOPY N/A 04/24/2024   Procedure: EGD (ESOPHAGOGASTRODUODENOSCOPY);  Surgeon: Nandigam, Kavitha V, MD;  Location: [facility] ENDOSCOPY;  Service: Gastroenterology;  Laterality: N/A;   I & D EXTREMITY Left 03/03/2021   Procedure: LEFT FOOT DEBRIDEMENT;  Surgeon: Harden Jerona GAILS, MD;  Location: [facility] OR;  Service: Orthopedics;  Laterality: Left;   INCISION AND DRAINAGE ABSCESS N/A 10/06/2021   Procedure: INCISION AND DRAINAGE BACK ABSCESS;   Surgeon: Signe Mitzie LABOR, MD;  Location: [facility] OR;  Service: General;  Laterality: N/A;   INJECTION OF SILICONE OIL Left 07/25/2023   Procedure: INJECTION OF SILICONE OIL;  Surgeon: Valdemar Rogue, MD;  Location: [facility] OR;  Service: Ophthalmology;  Laterality: Left;   MEMBRANE PEEL Left 07/25/2023   Procedure: MEMBRANE PEEL;  Surgeon: Valdemar Rogue, MD;  Location: [facility] OR;  Service: Ophthalmology;  Laterality: Left;   PARS PLANA VITRECTOMY Left 07/25/2023   Procedure: PARS PLANA VITRECTOMY WITH 25 GAUGE;  Surgeon: Valdemar Rogue, MD;  Location: [facility] OR;  Service: Ophthalmology;  Laterality: Left;   removal of back cyst     TONSILLECTOMY      Current Outpatient Medications  Medication Sig Dispense Refill   acetaminophen  (TYLENOL ) 325 MG tablet Take 2 tablets (650 mg total) by mouth every 6 (six) hours as needed for mild pain (pain score 1-3), fever or headache.     amLODipine  (NORVASC ) 10 MG tablet Take 10 mg by mouth every morning.     artificial tears ophthalmic solution Place 1 drop into the left eye 4 (four) times daily. 15 mL 0   atorvastatin  (LIPITOR) 40 MG tablet Take 40 mg by mouth at bedtime.     brimonidine  (ALPHAGAN ) 0.2 % ophthalmic solution Place 1 drop into the left eye in the morning and at bedtime.     carboxymethylcellulose 1 % ophthalmic solution Place 4 drops into the left eye in the morning, at noon, in the evening, and at bedtime.  DULoxetine  (CYMBALTA ) 60 MG capsule Take 60 mg by mouth in the morning.     ergocalciferol  (VITAMIN D2) 1.25 MG (50000 UT) capsule Take 50,000 Units by mouth once a week. Fridays     famotidine  (PEPCID ) 20 MG tablet Take 20 mg by mouth in the morning.     FARXIGA  10 MG TABS tablet Take 10 mg by mouth every morning. Dapaglifozin     feeding supplement (ENSURE PLUS HIGH PROTEIN) LIQD Take 237 mLs by mouth 2 (two) times daily between meals.     furosemide  (LASIX ) 20 MG tablet Take 20 mg by mouth every morning.     glipiZIDE  (GLUCOTROL ) 10 MG tablet Take  10 mg by mouth every morning.     ipratropium-albuterol  (DUONEB) 0.5-2.5 (3) MG/3ML SOLN Take 3 mLs by nebulization every 6 (six) hours as needed.     LINZESS  145 MCG CAPS capsule Take 145 mcg by mouth in the morning.     losartan  (COZAAR ) 100 MG tablet Take 0.5 tablets (50 mg total) by mouth in the morning.     Melatonin 5 MG CAPS Take 10 mg by mouth at bedtime.     oxyCODONE  (OXY IR/ROXICODONE ) 5 MG immediate release tablet Take 1 tablet (5 mg total) by mouth every 6 (six) hours as needed for moderate pain (pain score 4-6) or breakthrough pain. 10 tablet 0   pantoprazole  (PROTONIX ) 40 MG tablet Take 1 tablet (40 mg total) by mouth 2 (two) times daily before a meal. 60 tablet 0   polyethylene glycol (MIRALAX  / GLYCOLAX ) 17 g packet Take 17 g by mouth daily. (Patient not taking: Reported on 04/20/2024) 14 each 0   pregabalin  (LYRICA ) 100 MG capsule Take 1 capsule (100 mg total) by mouth 2 (two) times daily. 10 capsule 0   senna-docusate (SENOKOT-S) 8.6-50 MG tablet Take 1 tablet by mouth 2 (two) times daily.     sertraline  (ZOLOFT ) 50 MG tablet Take 50 mg by mouth in the morning.     traZODone  (DESYREL ) 150 MG tablet Take 75 mg by mouth at bedtime.     TRULICITY  1.5 MG/0.5ML SOPN Inject 0.5 mg into the skin once a week. On Thursdays     No current facility-administered medications for this visit.    No Known Allergies  Social History   Socioeconomic History   Marital status: Divorced    Spouse name: Not on file   Number of children: Not on file   Years of education: Not on file   Highest education level: Not on file  Occupational History   Not on file  Tobacco Use   Smoking status: Never   Smokeless tobacco: Never  Vaping Use   Vaping status: Never Used  Substance and Sexual Activity   Alcohol  use: Not Currently   Drug use: Not Currently   Sexual activity: Not Currently  Other Topics Concern   Not on file  Social History Narrative   Not on file   Social Drivers of Health    Financial Resource Strain: Not on file  Food Insecurity: No Food Insecurity (04/20/2024)   Hunger Vital Sign    Worried About Running Out of Food in the Last Year: Never true    Ran Out of Food in the Last Year: Never true  Transportation Needs: No Transportation Needs (04/20/2024)   PRAPARE - Administrator, Civil Service (Medical): No    Lack of Transportation (Non-Medical): No  Physical Activity: Not on file  Stress: Not on file  Social Connections: Not on file  Intimate Partner Violence: Not At Risk (04/20/2024)   Humiliation, Afraid, Rape, and Kick questionnaire    Fear of Current or Ex-Partner: No    Emotionally Abused: No    Physically Abused: No    Sexually Abused: No    Family History  Problem Relation Age of Onset   Diabetes Mother    Heart attack Neg Hx     Review of Systems:  As stated in the HPI and otherwise negative.   There were no vitals taken for this visit.  Physical Examination: General: Well developed, well nourished, NAD  HEENT: OP clear, mucus membranes moist  SKIN: warm, dry. No rashes. Neuro: No focal deficits  Musculoskeletal: Muscle strength 5/5 all ext  Psychiatric: Mood and affect normal  Neck: No JVD, no carotid bruits, no thyromegaly, no lymphadenopathy.  Lungs:Clear bilaterally, no wheezes, rhonci, crackles Cardiovascular: Regular rate and rhythm. No murmurs, gallops or rubs. Abdomen:Soft. Bowel sounds present. Non-tender.  Extremities: No lower extremity edema. Pulses are 2 + in the bilateral DP/PT.  EKG:  EKG {ACTION; IS/IS WNU:[id number]} ordered today. The ekg ordered today demonstrates ***  Recent Labs: 04/20/2024: B Natriuretic Peptide 28.4 04/26/2024: ALT 37; BUN 19; Hemoglobin 9.9; Magnesium  1.8; Platelets 412; Potassium 4.6; Sodium 137 04/27/2024: Creatinine, Ser 1.10   Lipid Panel No results found for: CHOL, TRIG, HDL, CHOLHDL, VLDL, LDLCALC, LDLDIRECT   Wt Readings from Last 3 Encounters:  04/20/24  259 lb 0.7 oz (117.5 kg)  04/19/24 280 lb 6.8 oz (127.2 kg)  03/28/24 266 lb (120.7 kg)      Assessment and Plan:   1.   Labs/ tests ordered today include:  No orders of the defined types were placed in this encounter.    Disposition:   F/U with me in ***    Signed, Lonni Cash, MD, [facility] 06/10/2024 1:33 PM    [facility] [address] [place], [place], [place]  [address] Phone: [phone]; Fax: [phone]

## 2024-06-11 ENCOUNTER — Ambulatory Visit: Attending: Cardiology | Admitting: Cardiovascular Disease

## 2024-06-11 ENCOUNTER — Encounter: Payer: Self-pay | Admitting: Cardiovascular Disease

## 2024-06-11 ENCOUNTER — Other Ambulatory Visit (HOSPITAL_COMMUNITY): Payer: Self-pay

## 2024-06-11 VITALS — BP 120/70 | HR 103 | Ht 74.0 in | Wt 269.6 lb

## 2024-06-11 DIAGNOSIS — R079 Chest pain, unspecified: Secondary | ICD-10-CM | POA: Diagnosis not present

## 2024-06-11 DIAGNOSIS — R072 Precordial pain: Secondary | ICD-10-CM

## 2024-06-11 MED ORDER — METOPROLOL TARTRATE 100 MG PO TABS
ORAL_TABLET | ORAL | 0 refills | Status: DC
  Filled 2024-06-11: qty 1, 1d supply, fill #0

## 2024-06-11 NOTE — Patient Instructions (Addendum)
 Medication Instructions:  Your physician recommends that you continue on your current medications as directed. Please refer to the Current Medication list given to you today.  *If you need a refill on your cardiac medications before your next appointment, please call your pharmacy*  Lab Work: none If you have labs (blood work) drawn today and your tests are completely normal, you will receive your results only by: MyChart Message (if you have MyChart) OR A paper copy in the mail If you have any lab test that is abnormal or we need to change your treatment, we will call you to review the results.  Testing/Procedures: Your physician has requested that you have cardiac CT. Cardiac computed tomography (CT) is a painless test that uses an x-ray machine to take clear, detailed pictures of your heart. For further information please visit [email or web address] Please follow instruction sheet as given.   Follow-Up: At [facility], you and your health needs are our priority.  As part of our continuing mission to provide you with exceptional heart care, our providers are all part of one team.  This team includes your primary Cardiologist (physician) and Advanced Practice Providers or APPs (Physician Assistants and Nurse Practitioners) who all work together to provide you with the care you need, when you need it.  Your next appointment:   2 month(s)  Provider:   APP  We recommend signing up for the patient portal called MyChart.  Sign up information is provided on this After Visit Summary.  MyChart is used to connect with patients for Virtual Visits (Telemedicine).  Patients are able to view lab/test results, encounter notes, upcoming appointments, etc.  Non-urgent messages can be sent to your provider as well.   To learn more about what you can do with MyChart, go to [email or web address].   Other Instructions   Your cardiac CT will be scheduled at one of the below locations:    [facility] [address] [place], [place] [address] [phone] (Severe contrast allergies only)  OR   [facility] [address] [place], [place] [address] [phone]  OR   [facility] [address] [place], [place] [address] [phone]  OR   [facility] and Vascular Tower [address]  [place], [place] [address] [phone]  OR   [facility] [address] [place], [place] [phone]  If scheduled at [facility], please arrive at the [facility] and Children's Entrance (Entrance C2) of [facility] 30 minutes prior to test start time. You can use the FREE valet parking offered at entrance C (encouraged to control the heart rate for the test)  Proceed to the [facility] Radiology Department (first floor) to check-in and test prep.  All radiology patients and guests should use entrance C2 at [facility], accessed from [facility], even though the hospital's physical address listed is [address].  If scheduled at the Heart and Vascular Tower at [address], please enter the parking lot using the [address] entrance and use the FREE valet service at the patient drop-off area. Enter the building and check-in with registration on the main floor.  If scheduled at [facility], please arrive to the Heart and Vascular Center 15 mins early for check-in and test prep.  There is spacious parking and easy access to the radiology department from the [facility] Heart and Vascular entrance. Please enter here and check-in with the  desk attendant.   If scheduled at [facility], please arrive 30 minutes early for check-in and test prep.  Please follow these instructions carefully (unless otherwise directed):  An IV will be required for this test and Nitroglycerin  will be given.  Hold all erectile dysfunction medications at  least 3 days (72 hrs) prior to test. (Ie viagra, cialis, sildenafil, tadalafil, etc)   On the Night Before the Test: Be sure to Drink plenty of water . Do not consume any caffeinated/decaffeinated beverages or chocolate 12 hours prior to your test. Do not take any antihistamines 12 hours prior to your test.   On the Day of the Test: Drink plenty of water  until 1 hour prior to the test. Do not eat any food 1 hour prior to test. You may take your regular medications prior to the test.  Take metoprolol  (Lopressor ) two hours prior to test. If you take Furosemide /Hydrochlorothiazide /Spironolactone/Chlorthalidone, please HOLD on the morning of the test. Patients who wear a continuous glucose monitor MUST remove the device prior to scanning.           After the Test: Drink plenty of water . After receiving IV contrast, you may experience a mild flushed feeling. This is normal. On occasion, you may experience a mild rash up to 24 hours after the test. This is not dangerous. If this occurs, you can take Benadryl  25 mg, Zyrtec, Claritin , or Allegra and increase your fluid intake. (Patients taking Tikosyn should avoid Benadryl , and may take Zyrtec, Claritin , or Allegra) If you experience trouble breathing, this can be serious. If it is severe call 911 IMMEDIATELY. If it is mild, please call our office.  We will call to schedule your test 2-4 weeks out understanding that some insurance companies will need an authorization prior to the service being performed.   For more information and frequently asked questions, please visit our website : [email or web address]  For non-scheduling related questions, please contact the cardiac imaging nurse navigator should you have any questions/concerns: Cardiac Imaging Nurse Navigators Direct Office Dial: [phone]   For scheduling needs, including cancellations and rescheduling, please call [place], [phone].

## 2024-06-16 ENCOUNTER — Ambulatory Visit (HOSPITAL_COMMUNITY)
Admission: RE | Admit: 2024-06-16 | Discharge: 2024-06-16 | Disposition: A | Discharge status: Home / Self Care | Source: Ambulatory Visit | Attending: Cardiovascular Disease | Admitting: Cardiovascular Disease

## 2024-06-16 DIAGNOSIS — R072 Precordial pain: Secondary | ICD-10-CM

## 2024-06-16 DIAGNOSIS — I517 Cardiomegaly: Secondary | ICD-10-CM | POA: Diagnosis not present

## 2024-06-16 DIAGNOSIS — I2510 Atherosclerotic heart disease of native coronary artery without angina pectoris: Secondary | ICD-10-CM | POA: Insufficient documentation

## 2024-06-16 LAB — POCT I-STAT CREATININE: Creatinine, Ser: 1.4 mg/dL — ABNORMAL HIGH (ref 0.61–1.24)

## 2024-06-16 MED ORDER — NITROGLYCERIN 0.4 MG SL SUBL: 0.8000 mg | SUBLINGUAL_TABLET | Freq: Once | SUBLINGUAL | Status: DC

## 2024-06-16 MED ORDER — IOHEXOL 350 MG/ML SOLN
100.0000 mL | Freq: Once | INTRAVENOUS | Status: AC | PRN
  Administered 2024-06-16: 100 mL via INTRAVENOUS

## 2024-06-21 ENCOUNTER — Ambulatory Visit: Payer: Self-pay | Admitting: Cardiovascular Disease

## 2024-06-21 DIAGNOSIS — I77810 Thoracic aortic ectasia: Secondary | ICD-10-CM

## 2024-06-22 MED ORDER — ASPIRIN 81 MG PO TBEC: 81.0000 mg | DELAYED_RELEASE_TABLET | Freq: Every day | ORAL | Status: AC

## 2024-08-09 NOTE — Progress Notes (Deleted)
 Cardiology Clinic Note   Patient Name: Belton Peplinski Date of Encounter: 08/09/2024  Primary Care Provider:  [facility] Cardiologist:  Lonni Cash, MD  Patient Profile    Travian Kerner 63 year old male presents to the clinic today for follow-up evaluation of his chest discomfort and coronary CTA.  Past Medical History    Past Medical History:  Diagnosis Date   CAP (community acquired pneumonia) 12/09/2021   CKD (chronic kidney disease)    GERD (gastroesophageal reflux disease)    HTN (hypertension)    Insulin  dependent type 2 diabetes mellitus (HCC)    Neuropathy    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   Stroke [facility])    Past Surgical History:  Procedure Laterality Date   ABDOMINAL AORTOGRAM W/LOWER EXTREMITY N/A 06/20/2023   Procedure: ABDOMINAL AORTOGRAM W/LOWER EXTREMITY;  Surgeon: Gretta Lonni PARAS, MD;  Location: [facility] INVASIVE CV LAB;  Service: Cardiovascular;  Laterality: N/A;   AMPUTATION Left 02/04/2021   Procedure: LEFT GREAT TOE AMPUTATION;  Surgeon: Harden Jerona GAILS, MD;  Location: [facility] OR;  Service: Orthopedics;  Laterality: Left;   AMPUTATION Left 02/10/2021   Procedure: LEFT FOOT 1ST RAY AMPUTATION;  Surgeon: Harden Jerona GAILS, MD;  Location: [facility] OR;  Service: Orthopedics;  Laterality: Left;   AMPUTATION Left 03/22/2021   Procedure: LEFT BELOW KNEE AMPUTATION;  Surgeon: Harden Jerona GAILS, MD;  Location: [facility] OR;  Service: Orthopedics;  Laterality: Left;   APPENDECTOMY     BACK SURGERY     CATARACT EXTRACTION     ESOPHAGOGASTRODUODENOSCOPY N/A 04/24/2024   Procedure: EGD (ESOPHAGOGASTRODUODENOSCOPY);  Surgeon: Nandigam, Kavitha V, MD;  Location: [facility] ENDOSCOPY;  Service: Gastroenterology;  Laterality: N/A;   I & D EXTREMITY Left 03/03/2021   Procedure: LEFT FOOT DEBRIDEMENT;  Surgeon: Harden Jerona GAILS, MD;  Location: [facility] OR;  Service: Orthopedics;  Laterality: Left;   INCISION AND DRAINAGE ABSCESS N/A 10/06/2021    Procedure: INCISION AND DRAINAGE BACK ABSCESS;  Surgeon: Signe Mitzie LABOR, MD;  Location: [facility] OR;  Service: General;  Laterality: N/A;   INJECTION OF SILICONE OIL Left 07/25/2023   Procedure: INJECTION OF SILICONE OIL;  Surgeon: Valdemar Rogue, MD;  Location: [facility] OR;  Service: Ophthalmology;  Laterality: Left;   MEMBRANE PEEL Left 07/25/2023   Procedure: MEMBRANE PEEL;  Surgeon: Valdemar Rogue, MD;  Location: [facility] OR;  Service: Ophthalmology;  Laterality: Left;   PARS PLANA VITRECTOMY Left 07/25/2023   Procedure: PARS PLANA VITRECTOMY WITH 25 GAUGE;  Surgeon: Valdemar Rogue, MD;  Location: [facility] OR;  Service: Ophthalmology;  Laterality: Left;   removal of back cyst     TONSILLECTOMY      Allergies  No Known Allergies  History of Present Illness    Arda Keadle has a PMH of hypertension, GERD, hyperlipidemia, diabetes, prior CVA, peripheral arterial disease, CKD, and coronary artery disease.  He was seen and evaluated by Dr. Cash on 06/11/2024.  During that time he noted that he did not know why he was there.  He lives at [facility].  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  He has left below-knee amputation.  He was admitted to [facility] 7/25 with pneumonia.  His echocardiogram 7/25 showed an LVEF of 55-60% and no valve disease.  He reported chest pain with exertion and associated dyspnea.  His mobility is limited due to his prosthetic leg.  He denied dizziness and  right lower extremity swelling.  He  denied palpitations.  Coronary CTA was ordered 06/16/2024 and showed a coronary calcium  score of 23 which placed him in the 52nd percentile for age sex and race matched control.  He was noted to have minimal focal calcified plaque in his left main and LAD with no associated stenosis.  Medical management was recommended.  He was also noted to have mild dilation 40 mm ascending, 42 mm aortic root dilation.  His statin therapy was continued.  He was started on  aspirin  81 mg with plan to repeat CTA in 1 year.  He presents to the clinic today for follow-up evaluation and states***.  *** denies chest pain, shortness of breath, lower extremity edema, fatigue, palpitations, melena, hematuria, hemoptysis, diaphoresis, weakness, presyncope, syncope, orthopnea, and PND.  Coronary artery disease-no chest pain today.  Notes episodes of exertional chest discomfort.  Coronary CTA reassuring.  Details above. Heart healthy low-sodium diet Increase physical activity as tolerated Continue statin therapy, aspirin , metoprolol   Hyperlipidemia-LDL***. High-fiber diet Continue statin therapy, aspirin   Essential hypertension-BP today***. Continue amlodipine , losartan , metoprolol   Sinus tachycardia-heart rate today***.  Denies lightheadedness, presyncope or syncope. Avoid triggers caffeine, chocolate, EtOH, dehydration excetra. Continue metoprolol   Disposition: Follow-up with Dr. Verlin or me in 9-12 months.  Home Medications    Prior to Admission medications   Medication Sig Start Date End Date Taking? Authorizing Provider  acetaminophen  (TYLENOL ) 325 MG tablet Take 2 tablets (650 mg total) by mouth every 6 (six) hours as needed for mild pain (pain score 1-3), fever or headache. 04/27/24   Sherrill Cable Latif, DO  amLODipine  (NORVASC ) 10 MG tablet Take 10 mg by mouth every morning. 05/11/22   [provider]  artificial tears ophthalmic solution Place 1 drop into the left eye 4 (four) times daily. 04/27/24   Sherrill Cable Latif, DO  aspirin  EC 81 MG tablet Take 1 tablet (81 mg total) by mouth daily. Swallow whole. 06/22/24   Verlin Lonni BIRCH, MD  atorvastatin  (LIPITOR) 40 MG tablet Take 40 mg by mouth at bedtime. 11/07/21   [provider]  bisacodyl  (BISACODYL  LAXATIVE) 10 MG suppository Place 10 mg rectally daily as needed for moderate constipation.    [provider]  brimonidine  (ALPHAGAN ) 0.2 % ophthalmic solution Place 1  drop into the left eye in the morning and at bedtime. 08/29/23   [provider]  carboxymethylcellulose 1 % ophthalmic solution Place 4 drops into the left eye in the morning, at noon, in the evening, and at bedtime.    [provider]  DULoxetine  (CYMBALTA ) 60 MG capsule Take 60 mg by mouth in the morning. 08/28/23   [provider]  ergocalciferol  (VITAMIN D2) 1.25 MG (50000 UT) capsule Take 50,000 Units by mouth once a week. Fridays    [provider]  famotidine  (PEPCID ) 20 MG tablet Take 20 mg by mouth in the morning. 04/20/23   [provider]  FARXIGA  10 MG TABS tablet Take 10 mg by mouth every morning. Dapaglifozin 02/07/22   [provider]  feeding supplement (ENSURE PLUS HIGH PROTEIN) LIQD Take 237 mLs by mouth 2 (two) times daily between meals. Patient taking differently: Take 237 mLs by mouth as needed. 04/19/24   Briana Elgin LABOR, MD  furosemide  (LASIX ) 20 MG tablet Take 20 mg by mouth every morning. 05/11/22   [provider]  glipiZIDE  (GLUCOTROL ) 10 MG tablet Take 10 mg by mouth every morning. 05/11/22   [provider]  HYDROmorphone  (DILAUDID ) 4 MG tablet Take 4  mg by mouth every 6 (six) hours as needed. 06/10/24   [provider]  Insulin  Lispro (HUMALOG [place]) Inject into the skin as needed (sliding scale).    [provider]  ipratropium-albuterol  (DUONEB) 0.5-2.5 (3) MG/3ML SOLN Take 3 mLs by nebulization every 6 (six) hours as needed. 04/27/24   Sherrill Cable Latif, DO  LINZESS  145 MCG CAPS capsule Take 145 mcg by mouth in the morning. 06/11/23   [provider]  losartan  (COZAAR ) 100 MG tablet Take 0.5 tablets (50 mg total) by mouth in the morning. 03/24/24   Ezenduka, Nkeiruka J, MD  Magnesium  Hydroxide (MILK OF MAGNESIA PO) Take 30 mLs by mouth as needed.    [provider]  Melatonin 5 MG CAPS Take 10 mg by mouth at bedtime.    [provider]  metoprolol  tartrate  (LOPRESSOR ) 100 MG tablet Take one tablet by mouth 2 hours prior to CT Scan 06/11/24   Verlin Lonni BIRCH, MD  pantoprazole  (PROTONIX ) 40 MG tablet Take 1 tablet (40 mg total) by mouth 2 (two) times daily before a meal. 04/27/24   Sherrill Cable Latif, DO  polyethylene glycol (MIRALAX  / GLYCOLAX ) 17 g packet Take 17 g by mouth daily. 11/18/22   Krishnan, Gokul, MD  pregabalin  (LYRICA ) 100 MG capsule Take 1 capsule (100 mg total) by mouth 2 (two) times daily. 04/19/24   Briana Elgin LABOR, MD  senna-docusate (SENOKOT-S) 8.6-50 MG tablet Take 1 tablet by mouth 2 (two) times daily. 04/27/24   Sherrill Cable Latif, DO  sertraline  (ZOLOFT ) 50 MG tablet Take 50 mg by mouth in the morning. 04/30/23   [provider]  tamsulosin  (FLOMAX ) 0.4 MG CAPS capsule Take 0.4 mg by mouth daily. 05/20/24   [provider]  traZODone  (DESYREL ) 150 MG tablet Take 75 mg by mouth at bedtime. 05/30/23   [provider]  TRULICITY  1.5 MG/0.5ML SOPN Inject 0.5 mg into the skin once a week. On Thursdays 04/04/23   [provider]    Family History    Family History  Problem Relation Age of Onset   Diabetes Mother    Heart attack Neg Hx    He indicated that the status of his mother is unknown. He indicated that the status of his neg hx is unknown.  Social History    Social History   Socioeconomic History   Marital status: Divorced    Spouse name: Not on file   Number of children: 1   Years of education: Not on file   Highest education level: Not on file  Occupational History   Occupation: Disabled  Tobacco Use   Smoking status: Never   Smokeless tobacco: Never  Vaping Use   Vaping status: Never Used  Substance and Sexual Activity   Alcohol  use: Not Currently   Drug use: Not Currently   Sexual activity: Not Currently  Other Topics Concern   Not on file  Social History Narrative   Not on file   Social Drivers of Health   Financial Resource Strain: Not on file  Food  Insecurity: No Food Insecurity (04/20/2024)   Hunger Vital Sign    Worried About Running Out of Food in the Last Year: Never true    Ran Out of Food in the Last Year: Never true  Transportation Needs: No Transportation Needs (04/20/2024)   PRAPARE - Administrator, Civil Service (Medical): No    Lack of Transportation (Non-Medical): No  Physical Activity: Not on file  Stress: Not on file  Social Connections: Not on file  Intimate Partner Violence: Not At Risk (04/20/2024)   Humiliation, Afraid, Rape, and Kick questionnaire    Fear of Current or Ex-Partner: No    Emotionally Abused: No    Physically Abused: No    Sexually Abused: No     Review of Systems    General:  No chills, fever, night sweats or weight changes.  Cardiovascular:  No chest pain, dyspnea on exertion, edema, orthopnea, palpitations, paroxysmal nocturnal dyspnea. Dermatological: No rash, lesions/masses Respiratory: No cough, dyspnea Urologic: No hematuria, dysuria Abdominal:   No nausea, vomiting, diarrhea, bright red blood per rectum, melena, or hematemesis Neurologic:  No visual changes, wkns, changes in mental status. All other systems reviewed and are otherwise negative except as noted above.  Physical Exam    VS:  There were no vitals taken for this visit. , BMI There is no height or weight on file to calculate BMI. GEN: Well nourished, well developed, in no acute distress. HEENT: normal. Neck: Supple, no JVD, carotid bruits, or masses. Cardiac: RRR, no murmurs, rubs, or gallops. No clubbing, cyanosis, edema.  Radials/DP/PT 2+ and equal bilaterally.  Respiratory:  Respirations regular and unlabored, clear to auscultation bilaterally. GI: Soft, nontender, nondistended, BS + x 4. MS: no deformity or atrophy. Skin: warm and dry, no rash. Neuro:  Strength and sensation are intact. Psych: Normal affect.  Accessory Clinical Findings    Recent Labs: 04/20/2024: B Natriuretic Peptide 28.4 04/26/2024:  ALT 37; BUN 19; Hemoglobin 9.9; Magnesium  1.8; Platelets 412; Potassium 4.6; Sodium 137 06/16/2024: Creatinine, Ser 1.40   Recent Lipid Panel No results found for: CHOL, TRIG, HDL, CHOLHDL, VLDL, LDLCALC, LDLDIRECT  No BP recorded.  {Refresh Note OR Click here to enter BP  :1}***    ECG personally reviewed by me today- ***    IMPRESSION: 1. Coronary calcium  score of 23. This was 69 percentile for age-, sex, and race-matched controls.   2. Normal coronary origin with right dominance.   3. Minimal focal calcified plaque in left main and LAD with no associated stenosis. Recommend medical management.   4. Aorta: Mildly dilated 40 mm ascending, 42 mm root at the level of sinus of Valsalva.   Electronically Signed: By: Oneil Parchment M.D. On: 06/17/2024 11:35      Assessment & Plan   1.  ***   Josefa HERO. Sherod Cisse NP-C     08/09/2024, 2:36 PM [facility] [address] [place], [place] [address] Office [phone]    Notice: This dictation was prepared with Dragon dictation along with smaller phrase technology. Any transcriptional errors that result from this process are unintentional and may not be corrected upon review.   I spent***minutes examining this patient, reviewing medications, and using patient centered shared decision making involving their cardiac care.   I spent  20 minutes reviewing past medical history,  medications, and prior cardiac tests.

## 2024-08-11 ENCOUNTER — Ambulatory Visit: Attending: General Practice | Admitting: General Practice

## 2024-08-11 ENCOUNTER — Encounter: Payer: Self-pay | Admitting: General Practice

## 2024-09-05 ENCOUNTER — Emergency Department (HOSPITAL_COMMUNITY)

## 2024-09-05 ENCOUNTER — Observation Stay (HOSPITAL_COMMUNITY)
Admission: EM | Admit: 2024-09-05 | Discharge: 2024-09-09 | Disposition: A | Discharge status: Skilled Nursing Facility | Attending: Emergency Medicine | Admitting: Emergency Medicine

## 2024-09-05 ENCOUNTER — Other Ambulatory Visit: Payer: Self-pay

## 2024-09-05 ENCOUNTER — Encounter (HOSPITAL_COMMUNITY): Payer: Self-pay

## 2024-09-05 ENCOUNTER — Observation Stay (HOSPITAL_COMMUNITY)

## 2024-09-05 DIAGNOSIS — F4321 Adjustment disorder with depressed mood: Secondary | ICD-10-CM | POA: Diagnosis present

## 2024-09-05 DIAGNOSIS — N289 Disorder of kidney and ureter, unspecified: Principal | ICD-10-CM

## 2024-09-05 DIAGNOSIS — N179 Acute kidney failure, unspecified: Secondary | ICD-10-CM | POA: Diagnosis not present

## 2024-09-05 DIAGNOSIS — R55 Syncope and collapse: Secondary | ICD-10-CM | POA: Diagnosis present

## 2024-09-05 DIAGNOSIS — E1169 Type 2 diabetes mellitus with other specified complication: Secondary | ICD-10-CM | POA: Diagnosis present

## 2024-09-05 DIAGNOSIS — G894 Chronic pain syndrome: Secondary | ICD-10-CM | POA: Diagnosis present

## 2024-09-05 DIAGNOSIS — E119 Type 2 diabetes mellitus without complications: Secondary | ICD-10-CM

## 2024-09-05 DIAGNOSIS — Z8673 Personal history of transient ischemic attack (TIA), and cerebral infarction without residual deficits: Secondary | ICD-10-CM

## 2024-09-05 DIAGNOSIS — R112 Nausea with vomiting, unspecified: Secondary | ICD-10-CM

## 2024-09-05 DIAGNOSIS — K148 Other diseases of tongue: Secondary | ICD-10-CM

## 2024-09-05 DIAGNOSIS — I152 Hypertension secondary to endocrine disorders: Secondary | ICD-10-CM | POA: Diagnosis present

## 2024-09-05 LAB — LIPASE, BLOOD: Lipase: 22 U/L (ref 11–51)

## 2024-09-05 LAB — COMPREHENSIVE METABOLIC PANEL WITH GFR
ALT: 13 U/L (ref 0–44)
AST: 23 U/L (ref 15–41)
Albumin: 3.9 g/dL (ref 3.5–5.0)
Alkaline Phosphatase: 123 U/L (ref 38–126)
Anion gap: 11 (ref 5–15)
BUN: 22 mg/dL (ref 8–23)
CO2: 27 mmol/L (ref 22–32)
Calcium: 8.9 mg/dL (ref 8.9–10.3)
Chloride: 103 mmol/L (ref 98–111)
Creatinine, Ser: 1.97 mg/dL — ABNORMAL HIGH (ref 0.61–1.24)
GFR, Estimated: 38 mL/min — ABNORMAL LOW (ref >60)
Glucose, Bld: 100 mg/dL — ABNORMAL HIGH (ref 70–99)
Potassium: 4 mmol/L (ref 3.5–5.1)
Sodium: 141 mmol/L (ref 135–145)
Total Bilirubin: 0.3 mg/dL (ref 0.0–1.2)
Total Protein: 7.1 g/dL (ref 6.5–8.1)

## 2024-09-05 LAB — CBC WITH DIFFERENTIAL/PLATELET
Abs Immature Granulocytes: 0.02 K/uL (ref 0.00–0.07)
Basophils Absolute: 0 K/uL (ref 0.0–0.1)
Basophils Relative: 0 %
Eosinophils Absolute: 0.1 K/uL (ref 0.0–0.5)
Eosinophils Relative: 1 %
HCT: 38.4 % — ABNORMAL LOW (ref 39.0–52.0)
Hemoglobin: 11.7 g/dL — ABNORMAL LOW (ref 13.0–17.0)
Immature Granulocytes: 0 %
Lymphocytes Relative: 15 %
Lymphs Abs: 1.1 K/uL (ref 0.7–4.0)
MCH: 24.5 pg — ABNORMAL LOW (ref 26.0–34.0)
MCHC: 30.5 g/dL (ref 30.0–36.0)
MCV: 80.3 fL (ref 80.0–100.0)
Monocytes Absolute: 0.6 K/uL (ref 0.1–1.0)
Monocytes Relative: 9 %
Neutro Abs: 5.3 K/uL (ref 1.7–7.7)
Neutrophils Relative %: 75 %
Platelets: 275 K/uL (ref 150–400)
RBC: 4.78 MIL/uL (ref 4.22–5.81)
RDW: 15.9 % — ABNORMAL HIGH (ref 11.5–15.5)
WBC: 7.1 K/uL (ref 4.0–10.5)
nRBC: 0 % (ref 0.0–0.2)

## 2024-09-05 LAB — I-STAT CHEM 8, ED
BUN: 23 mg/dL (ref 8–23)
Calcium, Ion: 1.1 mmol/L — ABNORMAL LOW (ref 1.15–1.40)
Chloride: 103 mmol/L (ref 98–111)
Creatinine, Ser: 2.2 mg/dL — ABNORMAL HIGH (ref 0.61–1.24)
Glucose, Bld: 99 mg/dL (ref 70–99)
HCT: 38 % — ABNORMAL LOW (ref 39.0–52.0)
Hemoglobin: 12.9 g/dL — ABNORMAL LOW (ref 13.0–17.0)
Potassium: 4 mmol/L (ref 3.5–5.1)
Sodium: 141 mmol/L (ref 135–145)
TCO2: 24 mmol/L (ref 22–32)

## 2024-09-05 LAB — CBG MONITORING, ED: Glucose-Capillary: 103 mg/dL — ABNORMAL HIGH (ref 70–99)

## 2024-09-05 MED ORDER — SERTRALINE HCL 50 MG PO TABS
50.0000 mg | ORAL_TABLET | Freq: Every day | ORAL | Status: DC
  Administered 2024-09-06 – 2024-09-09 (×4): 50 mg via ORAL
  Filled 2024-09-05 (×4): qty 1

## 2024-09-05 MED ORDER — IPRATROPIUM-ALBUTEROL 0.5-2.5 (3) MG/3ML IN SOLN: 3.0000 mL | Freq: Four times a day (QID) | RESPIRATORY_TRACT | Status: DC | PRN

## 2024-09-05 MED ORDER — ACETAMINOPHEN 650 MG RE SUPP: 650.0000 mg | Freq: Four times a day (QID) | RECTAL | Status: DC | PRN

## 2024-09-05 MED ORDER — SENNOSIDES-DOCUSATE SODIUM 8.6-50 MG PO TABS
1.0000 | ORAL_TABLET | Freq: Two times a day (BID) | ORAL | Status: DC
  Administered 2024-09-06 – 2024-09-07 (×4): 1 via ORAL
  Filled 2024-09-05 (×7): qty 1

## 2024-09-05 MED ORDER — TRAZODONE HCL 100 MG PO TABS
100.0000 mg | ORAL_TABLET | Freq: Every day | ORAL | Status: DC
  Administered 2024-09-05 – 2024-09-08 (×4): 100 mg via ORAL
  Filled 2024-09-05 (×4): qty 1

## 2024-09-05 MED ORDER — LACTATED RINGERS IV SOLN: INTRAVENOUS | Status: DC

## 2024-09-05 MED ORDER — HYDROMORPHONE HCL 2 MG PO TABS
4.0000 mg | ORAL_TABLET | Freq: Four times a day (QID) | ORAL | Status: DC | PRN
  Administered 2024-09-05 – 2024-09-09 (×12): 4 mg via ORAL
  Filled 2024-09-05 (×12): qty 2

## 2024-09-05 MED ORDER — ACETAMINOPHEN 325 MG PO TABS: 650.0000 mg | ORAL_TABLET | Freq: Four times a day (QID) | ORAL | Status: DC | PRN

## 2024-09-05 MED ORDER — BRIMONIDINE TARTRATE 0.2 % OP SOLN
1.0000 [drp] | Freq: Two times a day (BID) | OPHTHALMIC | Status: DC
  Administered 2024-09-05 – 2024-09-09 (×8): 1 [drp] via OPHTHALMIC
  Filled 2024-09-05: qty 5

## 2024-09-05 MED ORDER — SENNOSIDES-DOCUSATE SODIUM 8.6-50 MG PO TABS: 1.0000 | ORAL_TABLET | Freq: Every evening | ORAL | Status: DC | PRN

## 2024-09-05 MED ORDER — DULOXETINE HCL 20 MG PO CPEP
60.0000 mg | ORAL_CAPSULE | Freq: Every day | ORAL | Status: DC
  Administered 2024-09-06 – 2024-09-09 (×4): 60 mg via ORAL
  Filled 2024-09-05 (×4): qty 3

## 2024-09-05 MED ORDER — MELATONIN 5 MG PO TABS
10.0000 mg | ORAL_TABLET | Freq: Every day | ORAL | Status: DC
  Administered 2024-09-05 – 2024-09-08 (×4): 10 mg via ORAL
  Filled 2024-09-05 (×4): qty 2

## 2024-09-05 MED ORDER — PANTOPRAZOLE SODIUM 40 MG PO TBEC
40.0000 mg | DELAYED_RELEASE_TABLET | Freq: Two times a day (BID) | ORAL | Status: DC
  Administered 2024-09-06 – 2024-09-07 (×3): 40 mg via ORAL
  Filled 2024-09-05 (×3): qty 1

## 2024-09-05 MED ORDER — ATORVASTATIN CALCIUM 40 MG PO TABS
40.0000 mg | ORAL_TABLET | Freq: Every day | ORAL | Status: DC
  Administered 2024-09-06 – 2024-09-08 (×3): 40 mg via ORAL
  Filled 2024-09-05 (×4): qty 1

## 2024-09-05 MED ORDER — POLYVINYL ALCOHOL 1.4 % OP SOLN
1.0000 [drp] | Freq: Four times a day (QID) | OPHTHALMIC | Status: DC
  Administered 2024-09-05 – 2024-09-09 (×14): 1 [drp] via OPHTHALMIC
  Filled 2024-09-05: qty 15

## 2024-09-05 MED ORDER — LACTATED RINGERS IV BOLUS
500.0000 mL | Freq: Once | INTRAVENOUS | Status: AC
  Administered 2024-09-05: 500 mL via INTRAVENOUS

## 2024-09-05 MED ORDER — INSULIN ASPART 100 UNIT/ML IJ SOLN: 0.0000 [IU] | Freq: Every day | INTRAMUSCULAR | Status: DC

## 2024-09-05 MED ORDER — CARBOXYMETHYLCELLULOSE SODIUM 1 % OP SOLN: 4.0000 [drp] | Freq: Four times a day (QID) | OPHTHALMIC | Status: DC

## 2024-09-05 MED ORDER — MORPHINE SULFATE (PF) 4 MG/ML IV SOLN
4.0000 mg | Freq: Once | INTRAVENOUS | Status: AC
  Administered 2024-09-05: 4 mg via INTRAVENOUS
  Filled 2024-09-05: qty 1

## 2024-09-05 MED ORDER — TAMSULOSIN HCL 0.4 MG PO CAPS
0.4000 mg | ORAL_CAPSULE | Freq: Every day | ORAL | Status: DC
  Administered 2024-09-05 – 2024-09-08 (×4): 0.4 mg via ORAL
  Filled 2024-09-05 (×4): qty 1

## 2024-09-05 MED ORDER — INSULIN ASPART 100 UNIT/ML IJ SOLN
0.0000 [IU] | Freq: Three times a day (TID) | INTRAMUSCULAR | Status: DC
  Administered 2024-09-06: 1 [IU] via SUBCUTANEOUS
  Administered 2024-09-06: 2 [IU] via SUBCUTANEOUS
  Administered 2024-09-06 – 2024-09-08 (×5): 1 [IU] via SUBCUTANEOUS
  Administered 2024-09-08 (×2): 2 [IU] via SUBCUTANEOUS
  Administered 2024-09-09: 1 [IU] via SUBCUTANEOUS
  Filled 2024-09-05: qty 2
  Filled 2024-09-05 (×4): qty 1
  Filled 2024-09-05 (×2): qty 2
  Filled 2024-09-05: qty 1
  Filled 2024-09-05: qty 2
  Filled 2024-09-05: qty 1

## 2024-09-05 MED ORDER — ONDANSETRON HCL 4 MG PO TABS: 4.0000 mg | ORAL_TABLET | Freq: Four times a day (QID) | ORAL | Status: DC | PRN

## 2024-09-05 MED ORDER — ASPIRIN 81 MG PO TBEC
81.0000 mg | DELAYED_RELEASE_TABLET | Freq: Every day | ORAL | Status: DC
  Administered 2024-09-06 – 2024-09-09 (×4): 81 mg via ORAL
  Filled 2024-09-05 (×4): qty 1

## 2024-09-05 MED ORDER — POLYETHYLENE GLYCOL 3350 17 G PO PACK: 17.0000 g | PACK | Freq: Every day | ORAL | Status: DC | PRN

## 2024-09-05 MED ORDER — SODIUM CHLORIDE 0.9% FLUSH
3.0000 mL | Freq: Two times a day (BID) | INTRAVENOUS | Status: DC
  Administered 2024-09-05 – 2024-09-09 (×8): 3 mL via INTRAVENOUS

## 2024-09-05 MED ORDER — ONDANSETRON HCL 4 MG/2ML IJ SOLN
4.0000 mg | Freq: Once | INTRAMUSCULAR | Status: AC
  Administered 2024-09-05: 4 mg via INTRAVENOUS
  Filled 2024-09-05: qty 2

## 2024-09-05 MED ORDER — ONDANSETRON HCL 4 MG/2ML IJ SOLN
4.0000 mg | Freq: Four times a day (QID) | INTRAMUSCULAR | Status: DC | PRN
  Administered 2024-09-06 – 2024-09-07 (×2): 4 mg via INTRAVENOUS
  Filled 2024-09-05 (×2): qty 2

## 2024-09-05 MED ORDER — BISACODYL 5 MG PO TBEC: 5.0000 mg | DELAYED_RELEASE_TABLET | Freq: Every day | ORAL | Status: DC | PRN

## 2024-09-05 MED ORDER — HEPARIN SODIUM (PORCINE) 5000 UNIT/ML IJ SOLN
5000.0000 [IU] | Freq: Three times a day (TID) | INTRAMUSCULAR | Status: DC
  Administered 2024-09-05 – 2024-09-09 (×11): 5000 [IU] via SUBCUTANEOUS
  Filled 2024-09-05 (×11): qty 1

## 2024-09-05 MED ORDER — LINACLOTIDE 145 MCG PO CAPS
145.0000 ug | ORAL_CAPSULE | Freq: Every day | ORAL | Status: DC
  Administered 2024-09-06 – 2024-09-09 (×4): 145 ug via ORAL
  Filled 2024-09-05 (×4): qty 1

## 2024-09-05 NOTE — H&P (Signed)
 History and Physical    Brett White FMW:[id number] DOB: 1960/10/15 DOA: 09/05/2024  PCP: [facility]  Patient coming from: [facility]  I have personally briefly reviewed patient's old medical records in [facility]  Chief Complaint: Syncope  HPI: Brett White is a 63 y.o. male with medical history significant for T2DM, HTN, HLD, history of CVA, chronic pain on Dilaudid , s/p left BKA, esophageal dysmotility, depression/anxiety who presented to the ED from SNF for evaluation after syncopal episode.  Patient resides at [facility].  He says this morning he had a lot of nausea and vomiting, up to 6 times.  He did not have any associated abdominal pain.  He says his last bowel movement was 2 days ago.  He was standing up from his chair when he became lightheaded and passed out.  He fell forward and hit his head when he fell.  He was pain involving his head, neck, back, and right side of his body.  He does report chronic pain for which he takes Dilaudid  4 mg every 6 hours.  Patient also notes decreased urine output, he believes the last time he urinated was yesterday morning.  He denies any burning or pain when he did urinate.  EMS were called.  Per ED triage documentation initial BP was 98/60.  He was given 500 cc normal saline and brought to the ED for further evaluation.  ED Course  Labs/Imaging on admission: I have personally reviewed following labs and imaging studies.  Initial vitals showed BP 112/73, pulse 103, RR 17, temp 98.2 F, SpO2 98% on room air.  Labs showed BUN 22, creatinine 1.97, sodium 141, potassium 4.0, bicarb 27, serum glucose 100, LFTs within normal limits, lipase 22, WBC 7.1, hemoglobin 11.7, platelets 275.  UA pending collection.  CT head without contrast IMPRESSION: 1. No acute intracranial abnormality. 2. Encephalomalacia involving the globus pallidus bilaterally, possibly related to remote hypoxic/ischemic encephalopathy or  carbon monoxide poisoning. 3. Remote infarct within the right cerebellar hemisphere.  CT cervical spine without contrast IMPRESSION: 1. No acute abnormality of the cervical spine related to the reported neck trauma. 2. No acute findings. multilevel degenerative disc and degenerative joint disease with resultant mild-to-moderate multilevel neuroforaminal narrowing and moderate central canal stenosis at C5-6 as noted above. 3. Asymmetric thickening of the left base of the tongue, not well characterized on this examination. An infiltrative mass is not excluded. Recommend direct visualization and/or non-emergent contrast-enhanced CT of the neck soft tissues for further characterization.  CT L-spine IMPRESSION: 1. No acute fracture or listhesis of the lumbar spine. 2. Status post anterior lumbar discectomy and fusion with interbody bone cages at L3-L5, with incorporation of interbody bone graft and bridging callus at L4-5 and bridging callus at L3-4. Mild straightening of the operative segment. 3. Moderate degenerative disc disease at L5-S1 with moderate right and mild-to-moderate left neural foraminal narrowing. No significant canal stenosis.  CT abdomen/pelvis without contrast IMPRESSION: 1. No acute findings. 2. Indeterminate 12 mm exophytic lesion in the lower pole of the right kidney, enlarged since prior examination; recommend nonemergent renal protocol MRI (preferred) or CT without and with contrast for further characterization. MRI is preferred in younger patients and for evaluating calcified lesions. 3. Multiple nonobstructing right renal calculi. No hydronephrosis or ureteral calculi.  Thoracic spine x-ray negative for acute fracture.  Patient was given 500 cc LR, IV morphine  4 mg x 2, IV Zofran .  The hospitalist service was consulted for admission.  Review  of Systems: All systems reviewed and are negative except as documented in history of present illness above.   Past Medical  History:  Diagnosis Date   CAP (community acquired pneumonia) 12/09/2021   CKD (chronic kidney disease)    GERD (gastroesophageal reflux disease)    HTN (hypertension)    Insulin  dependent type 2 diabetes mellitus (HCC)    Neuropathy    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   Stroke [facility])     Past Surgical History:  Procedure Laterality Date   ABDOMINAL AORTOGRAM W/LOWER EXTREMITY N/A 06/20/2023   Procedure: ABDOMINAL AORTOGRAM W/LOWER EXTREMITY;  Surgeon: Gretta Lonni PARAS, MD;  Location: [facility] INVASIVE CV LAB;  Service: Cardiovascular;  Laterality: N/A;   AMPUTATION Left 02/04/2021   Procedure: LEFT GREAT TOE AMPUTATION;  Surgeon: Harden Jerona GAILS, MD;  Location: [facility] OR;  Service: Orthopedics;  Laterality: Left;   AMPUTATION Left 02/10/2021   Procedure: LEFT FOOT 1ST RAY AMPUTATION;  Surgeon: Harden Jerona GAILS, MD;  Location: [facility] OR;  Service: Orthopedics;  Laterality: Left;   AMPUTATION Left 03/22/2021   Procedure: LEFT BELOW KNEE AMPUTATION;  Surgeon: Harden Jerona GAILS, MD;  Location: [facility] OR;  Service: Orthopedics;  Laterality: Left;   APPENDECTOMY     BACK SURGERY     CATARACT EXTRACTION     ESOPHAGOGASTRODUODENOSCOPY N/A 04/24/2024   Procedure: EGD (ESOPHAGOGASTRODUODENOSCOPY);  Surgeon: Nandigam, Kavitha V, MD;  Location: [facility] ENDOSCOPY;  Service: Gastroenterology;  Laterality: N/A;   I & D EXTREMITY Left 03/03/2021   Procedure: LEFT FOOT DEBRIDEMENT;  Surgeon: Harden Jerona GAILS, MD;  Location: [facility] OR;  Service: Orthopedics;  Laterality: Left;   INCISION AND DRAINAGE ABSCESS N/A 10/06/2021   Procedure: INCISION AND DRAINAGE BACK ABSCESS;  Surgeon: Signe Mitzie LABOR, MD;  Location: [facility] OR;  Service: General;  Laterality: N/A;   INJECTION OF SILICONE OIL Left 07/25/2023   Procedure: INJECTION OF SILICONE OIL;  Surgeon: Valdemar Rogue, MD;  Location: [facility] OR;  Service: Ophthalmology;  Laterality: Left;   MEMBRANE PEEL Left 07/25/2023   Procedure: MEMBRANE PEEL;  Surgeon: Valdemar Rogue, MD;  Location: [facility] OR;  Service: Ophthalmology;  Laterality: Left;   PARS PLANA VITRECTOMY Left 07/25/2023   Procedure: PARS PLANA VITRECTOMY WITH 25 GAUGE;  Surgeon: Valdemar Rogue, MD;  Location: [facility] OR;  Service: Ophthalmology;  Laterality: Left;   removal of back cyst     TONSILLECTOMY      Social History: Patient denies history of tobacco use.  No Known Allergies  Family History  Problem Relation Age of Onset   Diabetes Mother    Heart attack Neg Hx      Prior to Admission medications   Medication Sig Start Date End Date Taking? Authorizing Provider  acetaminophen  (TYLENOL ) 325 MG tablet Take 2 tablets (650 mg total) by mouth every 6 (six) hours as needed for mild pain (pain score 1-3), fever or headache. 04/27/24   Sherrill Cable Latif, DO  amLODipine  (NORVASC ) 10 MG tablet Take 10 mg by mouth every morning. 05/11/22   [provider]  artificial tears ophthalmic solution Place 1 drop into the left eye 4 (four) times daily. 04/27/24   Sherrill Cable Latif, DO  aspirin  EC 81 MG tablet Take 1 tablet (81 mg total) by mouth daily. Swallow whole. 06/22/24   Verlin Lonni BIRCH, MD  atorvastatin  (LIPITOR) 40 MG tablet Take 40 mg by mouth at bedtime. 11/07/21   [provider]  bisacodyl  (BISACODYL  LAXATIVE) 10 MG suppository Place  10 mg rectally daily as needed for moderate constipation.    [provider]  brimonidine  (ALPHAGAN ) 0.2 % ophthalmic solution Place 1 drop into the left eye in the morning and at bedtime. 08/29/23   [provider]  carboxymethylcellulose 1 % ophthalmic solution Place 4 drops into the left eye in the morning, at noon, in the evening, and at bedtime.    [provider]  DULoxetine  (CYMBALTA ) 60 MG capsule Take 60 mg by mouth in the morning. 08/28/23   [provider]  ergocalciferol  (VITAMIN D2) 1.25 MG (50000 UT) capsule Take 50,000 Units by mouth once a week. Fridays    [provider]   famotidine  (PEPCID ) 20 MG tablet Take 20 mg by mouth in the morning. 04/20/23   [provider]  FARXIGA  10 MG TABS tablet Take 10 mg by mouth every morning. Dapaglifozin 02/07/22   [provider]  feeding supplement (ENSURE PLUS HIGH PROTEIN) LIQD Take 237 mLs by mouth 2 (two) times daily between meals. Patient taking differently: Take 237 mLs by mouth as needed. 04/19/24   Briana Elgin LABOR, MD  furosemide  (LASIX ) 20 MG tablet Take 20 mg by mouth every morning. 05/11/22   [provider]  glipiZIDE  (GLUCOTROL ) 10 MG tablet Take 10 mg by mouth every morning. 05/11/22   [provider]  HYDROmorphone  (DILAUDID ) 4 MG tablet Take 4 mg by mouth every 6 (six) hours as needed. 06/10/24   [provider]  Insulin  Lispro (HUMALOG [place]) Inject into the skin as needed (sliding scale).    [provider]  ipratropium-albuterol  (DUONEB) 0.5-2.5 (3) MG/3ML SOLN Take 3 mLs by nebulization every 6 (six) hours as needed. 04/27/24   Sherrill Cable Latif, DO  LINZESS  145 MCG CAPS capsule Take 145 mcg by mouth in the morning. 06/11/23   [provider]  losartan  (COZAAR ) 100 MG tablet Take 0.5 tablets (50 mg total) by mouth in the morning. 03/24/24   Ezenduka, Nkeiruka J, MD  Magnesium  Hydroxide (MILK OF MAGNESIA PO) Take 30 mLs by mouth as needed.    [provider]  Melatonin 5 MG CAPS Take 10 mg by mouth at bedtime.    [provider]  metoprolol  tartrate (LOPRESSOR ) 100 MG tablet Take one tablet by mouth 2 hours prior to CT Scan 06/11/24   Verlin Lonni BIRCH, MD  pantoprazole  (PROTONIX ) 40 MG tablet Take 1 tablet (40 mg total) by mouth 2 (two) times daily before a meal. 04/27/24   Sherrill Cable Latif, DO  polyethylene glycol (MIRALAX  / GLYCOLAX ) 17 g packet Take 17 g by mouth daily. 11/18/22   Krishnan, Gokul, MD  pregabalin  (LYRICA ) 100 MG capsule Take 1 capsule (100 mg total) by mouth 2 (two) times daily. 04/19/24   Briana Elgin LABOR, MD   senna-docusate (SENOKOT-S) 8.6-50 MG tablet Take 1 tablet by mouth 2 (two) times daily. 04/27/24   Sherrill Cable Latif, DO  sertraline  (ZOLOFT ) 50 MG tablet Take 50 mg by mouth in the morning. 04/30/23   [provider]  tamsulosin  (FLOMAX ) 0.4 MG CAPS capsule Take 0.4 mg by mouth daily. 05/20/24   [provider]  traZODone  (DESYREL ) 150 MG tablet Take 75 mg by mouth at bedtime. 05/30/23   [provider]  TRULICITY  1.5 MG/0.5ML SOPN Inject 0.5 mg into the skin once a week. On Thursdays 04/04/23   [provider]    Physical Exam: Vitals:   09/05/24 2000 09/05/24 2038 09/05/24 2052 09/05/24 2100  BP: ROLLEN)  163/108 (!) 146/95    Pulse: 85 79  79  Resp:  19  10  Temp:  98.5 F (36.9 C)    TempSrc:      SpO2: 92% 95% 95% 98%   Constitutional: Resting in bed in the left lateral decubitus position.  NAD, calm Eyes: EOMI, injected sclera medial portion of left eye ENMT: Mucous membranes are moist. Posterior pharynx clear of any exudate or lesions.Normal dentition.  No appreciable abnormality of the tongue base. Neck: normal, supple, no masses. Respiratory: clear to auscultation bilaterally, no wheezing, no crackles. Normal respiratory effort. No accessory muscle use.  Cardiovascular: Regular rate and rhythm, no murmurs / rubs / gallops. No extremity edema.  Abdomen: no tenderness, no masses palpated. Musculoskeletal: S/p left BKA.  Good ROM, no contractures. Normal muscle tone.  Skin: no rashes, lesions, ulcers. No induration Neurologic: Sensation intact. Strength 5/5 in all 4.  Psychiatric: Normal judgment and insight. Alert and oriented x 3. Normal mood.   EKG: Personally reviewed. Sinus rhythm, rate 76, no acute ischemic changes.  Rate is slower when compared to previous.  Assessment/Plan Principal Problem:   AKI (acute kidney injury) Active Problems:   Syncope   Insulin  dependent type 2 diabetes mellitus (HCC)   Hypertension associated with diabetes  (HCC)   Adjustment disorder with depressed mood   Chronic pain syndrome   Hyperlipidemia associated with type 2 diabetes mellitus (HCC)   History of CVA (cerebrovascular accident)   Lesion of right native kidney   Brett White is a 63 y.o. male with medical history significant for T2DM, HTN, HLD, history of CVA, chronic pain on Dilaudid , s/p left BKA, esophageal dysmotility, depression/anxiety who is admitted with AKI and for syncope eval.  Assessment and Plan: Acute kidney injury: Creatinine 1.97 on admission compared to baseline 1.1-1.2.  Likely multifactorial from GI losses and medication effect.  He has had decreased urine output, urinary retention also considered. - Obtain renal ultrasound - Bladder scan, In-N-Out cath as needed - Obtain urine studies - IV fluid hydration overnight - Hold Lasix , losartan , Farxiga   Syncope: History suggestive of orthostatic syncope.  Imaging negative for acute injury. - Check orthostatic vitals - IV fluid hydration overnight - PT/OT eval, fall precautions - Holding antihypertensives for now  Hypertension: Holding antihypertensives for now.  Type 2 diabetes: Holding home meds.  Placed on SSI.  History of CVA/hyperlipidemia: Continue aspirin  and atorvastatin .  Chronic pain syndrome: Continue home Dilaudid  4 mg every 6 hours as needed.  BPH: As above, patient reports decreased urine output.  Check bladder scan and resume Flomax .  Depression/anxiety: Continue Cymbalta , sertraline , trazodone .  Right kidney lesion: CT showed indeterminate 12 mm exophytic lesion in the lower pole of the right kidney, enlarged since prior exam.  Nonemergent follow-up renal protocol MRI or CT with and without contrast recommended for further evaluation.  Asymmetric thickening of the left base of tongue: Incidental finding on CT C-spine.  No obvious abnormality appreciated on visual inspection.  Consider nonemergent contrast-enhanced CT of the neck soft  tissues for further characterization.   DVT prophylaxis: heparin  injection 5,000 Units Start: 09/05/24 2200 Code Status: Full code, confirmed with patient on admission Family Communication: Discussed with patient, he has discussed with his primary contacts Disposition Plan: From [facility] and likely return to same facility pending clinical progress Consults called: None Severity of Illness: The appropriate patient status for this patient is OBSERVATION. Observation status is judged to be reasonable and necessary in order to provide the required  intensity of service to ensure the patient's safety. The patient's presenting symptoms, physical exam findings, and initial radiographic and laboratory data in the context of their medical condition is felt to place them at decreased risk for further clinical deterioration. Furthermore, it is anticipated that the patient will be medically stable for discharge from the hospital within 2 midnights of admission.   Jorie Blanch MD [facility]  If 7PM-7AM, please contact night-coverage [email or web address]  09/05/2024, 9:52 PM

## 2024-09-05 NOTE — ED Triage Notes (Addendum)
 Pt BIB EMS from [facility] rehab (left amputee below knee). Pt went to stand from wheelchair, had a syncopal episode, hit right side of head on night stand and fell. Pt twisted his lower back while falling. 98/60 initial bp.   20 g right wrist  500 ml ns  C-collar in place (neck pain)  130/60 92 hr 146 cbg 95% 2 L [place] (not always on o2) Resp 18

## 2024-09-05 NOTE — Plan of Care (Signed)
Discuss and review plan of care with patient/family  

## 2024-09-05 NOTE — ED Provider Notes (Signed)
 [place] [facility] AT [facility] Provider Note   CSN: [id number] Arrival date & time: 09/05/24  1712     Patient presents with: Brett White is a 63 y.o. male.    Fall     Patient has a history of prior stroke, hypertension, diabetes, acid reflux, osteomyelitis, chronic kidney disease, prior amputation.  Patient states he was getting up when he suddenly felt lightheaded.  He ended up falling forward passing out hitting his head.  Patient is now having pain in his head neck and back.  He also says the right side of his body is sore.  He denies any hip or knee pain to me.  Patient states he has been having issues with vomiting today.  He said multiple episodes.  He denies any diarrhea.  No dysuria.  Prior to Admission medications   Medication Sig Start Date End Date Taking? Authorizing Provider  acetaminophen  (TYLENOL ) 325 MG tablet Take 2 tablets (650 mg total) by mouth every 6 (six) hours as needed for mild pain (pain score 1-3), fever or headache. 04/27/24   Sherrill Cable Latif, DO  amLODipine  (NORVASC ) 10 MG tablet Take 10 mg by mouth every morning. 05/11/22   [provider]  artificial tears ophthalmic solution Place 1 drop into the left eye 4 (four) times daily. 04/27/24   Sherrill Cable Latif, DO  aspirin  EC 81 MG tablet Take 1 tablet (81 mg total) by mouth daily. Swallow whole. 06/22/24   Verlin Lonni BIRCH, MD  atorvastatin  (LIPITOR) 40 MG tablet Take 40 mg by mouth at bedtime. 11/07/21   [provider]  bisacodyl  (BISACODYL  LAXATIVE) 10 MG suppository Place 10 mg rectally daily as needed for moderate constipation.    [provider]  brimonidine  (ALPHAGAN ) 0.2 % ophthalmic solution Place 1 drop into the left eye in the morning and at bedtime. 08/29/23   [provider]  carboxymethylcellulose 1 % ophthalmic solution Place 4 drops into the left eye in the morning, at noon, in the evening, and at bedtime.     [provider]  DULoxetine  (CYMBALTA ) 60 MG capsule Take 60 mg by mouth in the morning. 08/28/23   [provider]  ergocalciferol  (VITAMIN D2) 1.25 MG (50000 UT) capsule Take 50,000 Units by mouth once a week. Fridays    [provider]  famotidine  (PEPCID ) 20 MG tablet Take 20 mg by mouth in the morning. 04/20/23   [provider]  FARXIGA  10 MG TABS tablet Take 10 mg by mouth every morning. Dapaglifozin 02/07/22   [provider]  feeding supplement (ENSURE PLUS HIGH PROTEIN) LIQD Take 237 mLs by mouth 2 (two) times daily between meals. Patient taking differently: Take 237 mLs by mouth as needed. 04/19/24   Briana Elgin LABOR, MD  furosemide  (LASIX ) 20 MG tablet Take 20 mg by mouth every morning. 05/11/22   [provider]  glipiZIDE  (GLUCOTROL ) 10 MG tablet Take 10 mg by mouth every morning. 05/11/22   [provider]  HYDROmorphone  (DILAUDID ) 4 MG tablet Take 4 mg by mouth every 6 (six) hours as needed. 06/10/24   [provider]  Insulin  Lispro (HUMALOG [place]) Inject into the skin as needed (sliding scale).    [provider]  ipratropium-albuterol  (DUONEB) 0.5-2.5 (3) MG/3ML SOLN Take 3 mLs by nebulization every 6 (six) hours as needed. 04/27/24   Sherrill Cable Latif, DO  LINZESS  145 MCG CAPS capsule Take 145 mcg by mouth in the morning.  06/11/23   [provider]  losartan  (COZAAR ) 100 MG tablet Take 0.5 tablets (50 mg total) by mouth in the morning. 03/24/24   Ezenduka, Nkeiruka J, MD  Magnesium  Hydroxide (MILK OF MAGNESIA PO) Take 30 mLs by mouth as needed.    [provider]  Melatonin 5 MG CAPS Take 10 mg by mouth at bedtime.    [provider]  metoprolol  tartrate (LOPRESSOR ) 100 MG tablet Take one tablet by mouth 2 hours prior to CT Scan 06/11/24   Verlin Lonni BIRCH, MD  pantoprazole  (PROTONIX ) 40 MG tablet Take 1 tablet (40 mg total) by mouth 2 (two) times daily before a meal. 04/27/24    Sherrill Cable Latif, DO  polyethylene glycol (MIRALAX  / GLYCOLAX ) 17 g packet Take 17 g by mouth daily. 11/18/22   Krishnan, Gokul, MD  pregabalin  (LYRICA ) 100 MG capsule Take 1 capsule (100 mg total) by mouth 2 (two) times daily. 04/19/24   Briana Elgin LABOR, MD  senna-docusate (SENOKOT-S) 8.6-50 MG tablet Take 1 tablet by mouth 2 (two) times daily. 04/27/24   Sherrill Cable Latif, DO  sertraline  (ZOLOFT ) 50 MG tablet Take 50 mg by mouth in the morning. 04/30/23   [provider]  tamsulosin  (FLOMAX ) 0.4 MG CAPS capsule Take 0.4 mg by mouth daily. 05/20/24   [provider]  traZODone  (DESYREL ) 150 MG tablet Take 75 mg by mouth at bedtime. 05/30/23   [provider]  TRULICITY  1.5 MG/0.5ML SOPN Inject 0.5 mg into the skin once a week. On Thursdays 04/04/23   [provider]    Allergies: Patient has no known allergies.    Review of Systems  Updated Vital Signs BP (!) 146/95   Pulse 79   Temp 98.5 F (36.9 C)   Resp 10   SpO2 98%   Physical Exam Vitals and nursing note reviewed.  Constitutional:      Appearance: He is well-developed. He is ill-appearing.  HENT:     Head: Normocephalic and atraumatic.     Right Ear: External ear normal.     Left Ear: External ear normal.     Mouth/Throat:     Comments: Mucous membranes dry, no obvious tongue lesion noted Eyes:     General: No scleral icterus.       Right eye: No discharge.        Left eye: No discharge.     Conjunctiva/sclera: Conjunctivae normal.  Neck:     Trachea: No tracheal deviation.  Cardiovascular:     Rate and Rhythm: Normal rate and regular rhythm.  Pulmonary:     Effort: Pulmonary effort is normal. No respiratory distress.     Breath sounds: Normal breath sounds. No stridor. No wheezing or rales.  Abdominal:     General: Bowel sounds are normal. There is no distension.     Palpations: Abdomen is soft.     Tenderness: There is no abdominal tenderness. There is no guarding or rebound.   Musculoskeletal:        General: No deformity.     Cervical back: Neck supple. Tenderness present.     Thoracic back: Tenderness present.     Lumbar back: Tenderness present.     Comments: Tenderness palpation right shoulder, no tenderness palpation right hip right knee  Skin:    General: Skin is warm and dry.     Findings: No rash.  Neurological:     General: No focal deficit present.     Mental Status: He  is alert.     Cranial Nerves: No cranial nerve deficit, dysarthria or facial asymmetry.     Sensory: No sensory deficit.     Motor: No abnormal muscle tone or seizure activity.     Coordination: Coordination normal.  Psychiatric:        Mood and Affect: Mood normal.     (all labs ordered are listed, but only abnormal results are displayed) Labs Reviewed  COMPREHENSIVE METABOLIC PANEL WITH GFR - Abnormal; Notable for the following components:      Result Value   Glucose, Bld 100 (*)    Creatinine, Ser 1.97 (*)    GFR, Estimated 38 (*)    All other components within normal limits  CBC WITH DIFFERENTIAL/PLATELET - Abnormal; Notable for the following components:   Hemoglobin 11.7 (*)    HCT 38.4 (*)    MCH 24.5 (*)    RDW 15.9 (*)    All other components within normal limits  I-STAT CHEM 8, ED - Abnormal; Notable for the following components:   Creatinine, Ser 2.20 (*)    Calcium , Ion 1.10 (*)    Hemoglobin 12.9 (*)    HCT 38.0 (*)    All other components within normal limits  LIPASE, BLOOD  URINALYSIS, ROUTINE W REFLEX MICROSCOPIC  SODIUM, URINE, RANDOM  CREATININE, URINE, RANDOM    EKG: EKG Interpretation Date/Time:  Saturday September 05 2024 21:00:51 EST Ventricular Rate:  76 PR Interval:  190 QRS Duration:  104 QT Interval:  379 QTC Calculation: 427 R Axis:   -25  Text Interpretation: Sinus rhythm Borderline left axis deviation Confirmed by Randol Simmonds [phone]) on 09/05/2024 9:05:49 PM  Radiology: CT L-SPINE NO CHARGE Result Date: 09/05/2024 EXAM: CT OF  THE LUMBAR SPINE WITHOUT CONTRAST 09/05/2024 07:41:55 PM TECHNIQUE: CT of the lumbar spine was performed without the administration of intravenous contrast. Multiplanar reformatted images are provided for review. Automated exposure control, iterative reconstruction, and/or weight based adjustment of the mA/kV was utilized to reduce the radiation dose to as low as reasonably achievable. COMPARISON: None available. CLINICAL HISTORY: Fall, low back twisting injury. FINDINGS: BONES AND ALIGNMENT: Normal vertebral body heights. Anterior lumbar discectomy and fusion with interbody bone cages seen at L3-L5. Incorporation of interbody bone graft and bridging callus identified at L4-L5. Bridging callus identified involving the posterolateral aspects of the vertebral bodies bilaterally at L3-L4. Mild straightening of the operative segment. Left L3 hemilaminectomy. Left L4 hemilaminectomy. No acute fracture or listhesis of the lumbar spine. No suspicious bone lesion. DEGENERATIVE CHANGES: Disc space narrowing and endplate remodeling at L5-S1 are present, in keeping with changes of degenerative disc disease moderate degenerative disc disease. Moderate degenerative changes are seen throughout the remainder of the visualized thoracolumbar spine. T12-L1: Normal. L1-L2: Mild bilateral facet arthrosis with possible left synovial cyst resulting in mild left neural foraminal narrowing. The right neural foramen is widely patent. Broad-based disc bulge results in mild central canal stenosis within the subforaminal zone. L2-L3: Small bilateral facet arthrosis. Broad-based disc bulge. Moderate central canal stenosis within the subarticular zone. No significant neural foraminal narrowing. L3-L4: Anterior discectomy and fusion. No significant neural foraminal narrowing. Left L3 hemilaminectomy. No significant canal stenosis. L4-L5: Anterior discectomy and fusion. Left L4 hemilaminectomy. No significant neural foraminal narrowing. No canal  stenosis. L5-S1: Moderate right and mild-to-moderate left neural foraminal narrowing. A posterior disc osteophyte complex mildly effaces the lateral recesses bilaterally. No significant canal stenosis. Advanced bilateral facet arthrosis. SOFT TISSUES: 10 mm nonobstructing calculus within the visualized  lower pole of the right kidney. Mild aortoiliac atherosclerotic calcification. No paraspinal inflammatory change or fluid collection identified. No acute abnormality. IMPRESSION: 1. No acute fracture or listhesis of the lumbar spine. 2. Status post anterior lumbar discectomy and fusion with interbody bone cages at L3-L5, with incorporation of interbody bone graft and bridging callus at L4-5 and bridging callus at L3-4. Mild straightening of the operative segment. 3. Moderate degenerative disc disease at L5-S1 with moderate right and mild-to-moderate left neural foraminal narrowing. No significant canal stenosis. Electronically signed by: Dorethia Molt MD 09/05/2024 08:11 PM EST RP Workstation: [id number]   DG Thoracic Spine 2 View Result Date: 09/05/2024 CLINICAL DATA:  Clemens, pain EXAM: THORACIC SPINE 2 VIEWS COMPARISON:  None Available. FINDINGS: Frontal and lateral views of the thoracic spine are obtained. Alignment is anatomic. There are no acute displaced fractures. Mild spondylosis within the lower thoracic spine. The paraspinal soft tissues are unremarkable. IMPRESSION: 1. Lower thoracic spondylosis. 2. No acute fracture. Electronically Signed   By: Ozell Daring M.D.   On: 09/05/2024 20:11   CT ABDOMEN PELVIS WO CONTRAST Result Date: 09/05/2024 EXAM: CT ABDOMEN AND PELVIS WITHOUT CONTRAST 09/05/2024 07:41:55 PM TECHNIQUE: CT of the abdomen and pelvis was performed without the administration of intravenous contrast. Multiplanar reformatted images are provided for review. Automated exposure control, iterative reconstruction, and/or weight-based adjustment of the mA/kV was utilized to reduce the radiation  dose to as low as reasonably achievable. COMPARISON: 04/17/2024. Also compared to renal sonogram of 04/17/2024 and prior examination of 06/29/2021. CLINICAL HISTORY: Abdominal pain, acute, nonlocalized. FINDINGS: LOWER CHEST: No acute abnormality. LIVER: The liver is unremarkable. GALLBLADDER AND BILE DUCTS: Gallbladder is unremarkable. No biliary ductal dilatation. SPLEEN: No acute abnormality. PANCREAS: No acute abnormality. ADRENAL GLANDS: No acute abnormality. KIDNEYS, URETERS AND BLADDER: Moderate bilateral perinephric stranding is present, nonspecific, but stable since prior examination. A simple cortical cyst is seen within the interpolar region of the left kidney, for which no follow-up imaging is recommended. An indeterminate 12 mm exophytic lesion is seen arising from the lower pole of the right kidney, which was not visualized on the prior renal sonogram of 04/17/2024, and has enlarged slightly since her prior examination of 06/29/2021, with this measured 8 mm. Further evaluation with pre and post contrast MRI or CT should be considered. MRI is preferred in younger patients (due to lack of ionizing radiation) and for evaluating calcified lesion(s). Multiple nonobstructing renal calculi are seen within the right kidney measuring up to 10 cm. No hydronephrosis. No ureteral calculi. The bladder is unremarkable. GI AND BOWEL: Appendix absent. The stomach, small bowel, and large bowel are otherwise unremarkable. There is no bowel obstruction. PERITONEUM AND RETROPERITONEUM: No ascites. No free air. VASCULATURE: Mild aortoiliac atherosclerotic calcification. No aortic aneurysm. Aorta is normal in caliber. LYMPH NODES: No lymphadenopathy. REPRODUCTIVE ORGANS: No acute abnormality. BONES AND SOFT TISSUES: L3-L5 anterior lumbar fusion with instrumentation has been performed with solid incorporation of interbody bone graft and bridging callus identified. Osseous structures are otherwise age-appropriate. No acute  bone abnormality. Tiny fat-containing umbilical hernia. IMPRESSION: 1. No acute findings. 2. Indeterminate 12 mm exophytic lesion in the lower pole of the right kidney, enlarged since prior examination; recommend nonemergent renal protocol MRI (preferred) or CT without and with contrast for further characterization. MRI is preferred in younger patients and for evaluating calcified lesions. 3. Multiple nonobstructing right renal calculi. No hydronephrosis or ureteral calculi. Electronically signed by: Dorethia Molt MD 09/05/2024 08:00 PM EST RP Workstation: [id number]  CT Cervical Spine Wo Contrast Result Date: 09/05/2024 EXAM: CT CERVICAL SPINE WITHOUT CONTRAST 09/05/2024 07:41:55 PM TECHNIQUE: CT of the cervical spine was performed without the administration of intravenous contrast. Multiplanar reformatted images are provided for review. Automated exposure control, iterative reconstruction, and/or weight based adjustment of the mA/kV was utilized to reduce the radiation dose to as low as reasonably achievable. COMPARISON: None available. CLINICAL HISTORY: Neck trauma, midline tenderness (Age 52-64y). FINDINGS: CERVICAL SPINE: BONES AND ALIGNMENT: Cervical alignment is normal. Mild reversal of the normal cervical lordosis. No listhesis. The atlantodental interval is not widened. No acute fracture or traumatic malalignment. DEGENERATIVE CHANGES: Disc space narrowing and endplate remodeling are seen at C3-C4 and C5-C7 in keeping with changes of advanced degenerative disc disease. Degenerative ankylosis of the vertebral bodies and facet joints of C6-C7. Bridging anterior disc osteophyte seen at C5-C7. Posterior disc osteophyte complex at C5-C6 results in moderate central canal stenosis with an AP diameter of the spinal canal of approximately 6-7 mm and flattening of the thecal sac. The spinal canal is otherwise widely patent. Multilevel uncovertebral and facet arthrosis results in moderate bilateral neural  foraminal narrowing at C3-C4 and C6-C7, and mild bilateral neural foraminal narrowing at C5-C6. SOFT TISSUES: No prevertebral soft tissue swelling. There is asymmetric thickening of the left base of the tongue which is not well characterized on this examination; however, an infiltrative mass in this region is not excluded. Correlation with direct visualization and/or oral contrast enhanced CT examination of the neck soft tissues is recommended for further characterization. IMPRESSION: 1. No acute abnormality of the cervical spine related to the reported neck trauma. 2. No acute findings. multilevel degenerative disc and degenerative joint disease with resultant mild-to-moderate multilevel neuroforaminal narrowing and moderate central canal stenosis at C5-6 as noted above. 3. Asymmetric thickening of the left base of the tongue, not well characterized on this examination. An infiltrative mass is not excluded. Recommend direct visualization and/or non-emergent contrast-enhanced CT of the neck soft tissues for further characterization. Electronically signed by: Dorethia Molt MD 09/05/2024 07:51 PM EST RP Workstation: [id number]   CT Head Wo Contrast Result Date: 09/05/2024 EXAM: CT HEAD WITHOUT CONTRAST 09/05/2024 07:41:55 PM TECHNIQUE: CT of the head was performed without the administration of intravenous contrast. Automated exposure control, iterative reconstruction, and/or weight based adjustment of the mA/kV was utilized to reduce the radiation dose to as low as reasonably achievable. COMPARISON: None available. CLINICAL HISTORY: Head trauma, moderate-severe. FINDINGS: BRAIN AND VENTRICLES: No acute hemorrhage. No evidence of acute infarct. Encephalomalacia involving the globus pallidus bilaterally may reflect a sequela of remote hypoxic / ischemic encephalopathy and/or carbon monoxide poisoning. Remote-appearing infarct within the right cerebellar hemisphere. No hydrocephalus. No extra-axial collection. No mass  effect or midline shift. ORBITS: No acute abnormality. SINUSES: No acute abnormality. SOFT TISSUES AND SKULL: No acute soft tissue abnormality. No skull fracture. IMPRESSION: 1. No acute intracranial abnormality. 2. Encephalomalacia involving the globus pallidus bilaterally, possibly related to remote hypoxic/ischemic encephalopathy or carbon monoxide poisoning. 3. Remote infarct within the right cerebellar hemisphere. Electronically signed by: Dorethia Molt MD 09/05/2024 07:46 PM EST RP Workstation: [id number]     Procedures   Medications Ordered in the ED  morphine  (PF) 4 MG/ML injection 4 mg (4 mg Intravenous Given 09/05/24 1752)  ondansetron  (ZOFRAN ) injection 4 mg (4 mg Intravenous Given 09/05/24 1753)  lactated ringers  bolus 500 mL (0 mLs Intravenous Stopped 09/05/24 2046)  morphine  (PF) 4 MG/ML injection 4 mg (4 mg Intravenous Given 09/05/24 1915)  Clinical Course as of 09/05/24 2105  Sat Sep 05, 2024  2054 Case discussed with Dr Tobie [JK]  2054 I-stat chem 8, ED (not at [facility], [facility] or [facility])(!) elevated creatinine [JK]  2055 CBC with Diff(!) No leukocytosis [JK]    Clinical Course User Index [JK] Randol Simmonds, MD                                 Medical Decision Making Problems Addressed: AKI (acute kidney injury): acute illness or injury that poses a threat to life or bodily functions Syncope, unspecified syncope type: acute illness or injury that poses a threat to life or bodily functions Tongue lesion: undiagnosed new problem with uncertain prognosis  Amount and/or Complexity of Data Reviewed Labs: ordered. Decision-making details documented in ED Course. Radiology: ordered.  Risk Prescription drug management. Decision regarding hospitalization.   Patient presented to the ED for evaluation after a fall.  This was precipitated by nausea vomiting and a syncopal episode.  Patient reports multiple episodes of vomiting today.    Patient's laboratory tests are notable for an  AKI with elevated BUN and creatinine.  CT scan did not show any definite injury however there were incidental findings of some possible swelling at the base of his tongue.  There was also an incidental renal lesion that will require outpatient follow-up.  Patient required multiple doses of IV narcotic pain medications for his discomfort.  He was treated with IV fluids for his dehydration and antiemetics.  With his syncopal episode dehydration and AKI will consult the medical service for admission.  Patient will require follow-up imaging of his tongue and kidney    Final diagnoses:  Kidney lesion  Tongue lesion  AKI (acute kidney injury)  Syncope, unspecified syncope type  Nausea and vomiting, unspecified vomiting type    ED Discharge Orders     None          Randol Simmonds, MD 09/05/24 2105

## 2024-09-05 NOTE — ED Notes (Signed)
 Patient denies being on oxygen at home. Has been on 2L d/t low sat. Pt mistakenly removed cannula and O2 dropped to 89.

## 2024-09-05 NOTE — Hospital Course (Signed)
 Brett White is a 63 y.o. male with medical history significant for T2DM, HTN, HLD, history of CVA, chronic pain on Dilaudid , s/p left BKA, esophageal dysmotility, depression/anxiety who is admitted with AKI and for syncope eval.

## 2024-09-06 LAB — GLUCOSE, CAPILLARY
Glucose-Capillary: 126 mg/dL — ABNORMAL HIGH (ref 70–99)
Glucose-Capillary: 167 mg/dL — ABNORMAL HIGH (ref 70–99)
Glucose-Capillary: 142 mg/dL — ABNORMAL HIGH (ref 70–99)
Glucose-Capillary: 159 mg/dL — ABNORMAL HIGH (ref 70–99)

## 2024-09-06 LAB — CBC
HCT: 36.8 % — ABNORMAL LOW (ref 39.0–52.0)
Hemoglobin: 11 g/dL — ABNORMAL LOW (ref 13.0–17.0)
MCH: 24.4 pg — ABNORMAL LOW (ref 26.0–34.0)
MCHC: 29.9 g/dL — ABNORMAL LOW (ref 30.0–36.0)
MCV: 81.8 fL (ref 80.0–100.0)
Platelets: 233 K/uL (ref 150–400)
RBC: 4.5 MIL/uL (ref 4.22–5.81)
RDW: 15.9 % — ABNORMAL HIGH (ref 11.5–15.5)
WBC: 5.7 K/uL (ref 4.0–10.5)
nRBC: 0 % (ref 0.0–0.2)

## 2024-09-06 LAB — BASIC METABOLIC PANEL WITH GFR
Anion gap: 7 (ref 5–15)
BUN: 19 mg/dL (ref 8–23)
CO2: 29 mmol/L (ref 22–32)
Calcium: 8.9 mg/dL (ref 8.9–10.3)
Chloride: 105 mmol/L (ref 98–111)
Creatinine, Ser: 1.59 mg/dL — ABNORMAL HIGH (ref 0.61–1.24)
GFR, Estimated: 49 mL/min — ABNORMAL LOW (ref >60)
Glucose, Bld: 96 mg/dL (ref 70–99)
Potassium: 4.2 mmol/L (ref 3.5–5.1)
Sodium: 141 mmol/L (ref 135–145)

## 2024-09-06 MED ORDER — SODIUM CHLORIDE 0.9 % IV SOLN: INTRAVENOUS | Status: DC

## 2024-09-06 NOTE — Progress Notes (Signed)
   09/06/24 1621  PT Visit Information  Last PT Received On 09/06/24  Reason Eval/Treat Not Completed PT screened, no needs identified, will sign off (pt reports he does not feel he needs PT eval during stay. Has been independent with mobility (transfers to w/c and use of prosthesis and RW for ambulation with h/o BKA) PT will sign off. Please reconsult if mobility status changes.)

## 2024-09-06 NOTE — Progress Notes (Signed)
 PROGRESS NOTE    Brett White  FMW:[id number] DOB: Jan 30, 1961 DOA: 09/05/2024 PCP: [facility]   Brief Narrative:  63 y.o. male with medical history significant for diabetes mellitus type 2, HTN, hyperlipidemia, history of unspecified CVA, chronic pain on Dilaudid , s/p left BKA, esophageal dysmotility, depression/anxiety presented from SNF for evaluation of syncopal episode along with nausea and vomiting.  On presentation, initial BP was 98/60.  Creatinine 1.97 , Lipase 22, WBC 7.1.  CT head without contrast showed no acute intracranial abnormality.  CT cervical spine without contrast showed no acute cervical spine abnormality.  CT L-spine showed no acute fracture or listhesis of the lumbar spine.  CT of abdomen and pelvis without contrast showed no acute findings.  Thoracic spine x-ray was negative for fracture.  He was started on IV fluids and antiemetics.  Assessment & Plan:   Acute kidney injury -Possibly prerenal from GI losses.  Creatinine 1.97 compared to baseline of 1.1-1.4.  Imaging as above.  Renal ultrasound showed no hydronephrosis but showed exophytic right renal lesion and recommended nonemergent renal protocol MRI. - Creatinine improving to 1.59 today.  Continue IV fluids.  Repeat a.m. labs - Lasix , losartan  and Farxiga  on hold  Syncope - Possibly orthostatic.  Imaging negative for acute injury.  Continue orthostatic vital monitoring.  PT/OT eval.  IV fluid as above.  Antihypertensives on hold for now  Hypertension -Monitor blood pressure.  Antihypertensive plan as above  Diabetes mellitus type 2 - Home meds on hold.  Continue CBGs with SSI  Hyperlipidemia Continue statin  Anxiety/depression - Continue duloxetine , sertraline  and trazodone   History of unspecified CVA - Continue statin and aspirin   Chronic pain syndrome with opiate dependence -Continue current pain management regimen.  Outpatient follow-up with pain management  BPH - Continue  Flomax   Right kidney lesion -CT showed indeterminate 12 mm exophytic lesion in the lower pole of the right kidney, enlarged since prior exam. Nonemergent follow-up renal protocol MRI or CT with and without contrast recommended for further evaluation.  This can happen as an outpatient  Asymmetric thickening of the left base of tongue -Incidental finding on CT C-spine.  No obvious abnormality appreciated on visual inspection.  Consider nonemergent contrast-enhanced CT of the neck soft tissues as an outpatient for further characterization.   Obesity class I - Outpatient follow-up  DVT prophylaxis: Heparin  subcutaneous Code Status: Full Family Communication: None at bedside Disposition Plan: Status is: Observation The patient will require care spanning > 2 midnights and should be moved to inpatient because: Of severity of illness    Consultants: None  Procedures: None  Antimicrobials: None   Subjective: Patient seen and examined at bedside.  Still complains of flank pain with some nausea but denies any vomiting.  Does not feel ready to be discharged back today.  No fever, chest pain or shortness of breath reported. Objective: Vitals:   09/05/24 2243 09/06/24 0424 09/06/24 0500 09/06/24 0925  BP: (!) 159/88 115/75  124/74  Pulse: 88 78  86  Resp:  18  18  Temp:  98.1 F (36.7 C)  98 F (36.7 C)  TempSrc:      SpO2:  98%  96%  Weight:   116.5 kg   Height:        Intake/Output Summary (Last 24 hours) at 09/06/2024 1012 Last data filed at 09/06/2024 0500 Gross per 24 hour  Intake 500 ml  Output 600 ml  Net -100 ml   Filed Weights   09/05/24 2234  09/06/24 0500  Weight: 115.2 kg 116.5 kg    Examination:  General exam: Appears calm and comfortable.  Chronically ill and deconditioned looking Respiratory system: Bilateral decreased breath sounds at bases with scattered crackles Cardiovascular system: S1 & S2 heard, Rate controlled Gastrointestinal system: Abdomen is  obese, nondistended, soft and mildly tender in the lower quadrant; normal bowel sounds heard. Extremities: Left BKA present. Central nervous system: Alert and oriented.  Slow to respond.  Poor historian.  No focal neurological deficits. Moving extremities Skin: No rashes, lesions or ulcers Psychiatry: Flat affect.  Not agitated   Data Reviewed: I have personally reviewed following labs and imaging studies  CBC: Recent Labs  Lab 09/05/24 1850 09/05/24 1857 09/06/24 0548  WBC 7.1  --  5.7  NEUTROABS 5.3  --   --   HGB 11.7* 12.9* 11.0*  HCT 38.4* 38.0* 36.8*  MCV 80.3  --  81.8  PLT 275  --  233   Basic Metabolic Panel: Recent Labs  Lab 09/05/24 1850 09/05/24 1857 09/06/24 0548  NA 141 141 141  K 4.0 4.0 4.2  CL 103 103 105  CO2 27  --  29  GLUCOSE 100* 99 96  BUN 22 23 19   CREATININE 1.97* 2.20* 1.59*  CALCIUM  8.9  --  8.9   GFR: Estimated Creatinine Clearance: 66.2 mL/min (A) (by C-G formula based on SCr of 1.59 mg/dL (H)). Liver Function Tests: Recent Labs  Lab 09/05/24 1850  AST 23  ALT 13  ALKPHOS 123  BILITOT 0.3  PROT 7.1  ALBUMIN  3.9   Recent Labs  Lab 09/05/24 1850  LIPASE 22   No results for input(s): AMMONIA in the last 168 hours. Coagulation Profile: No results for input(s): INR, PROTIME in the last 168 hours. Cardiac Enzymes: No results for input(s): CKTOTAL, CKMB, CKMBINDEX, TROPONINI in the last 168 hours. BNP (last 3 results) No results for input(s): PROBNP in the last 8760 hours. HbA1C: No results for input(s): HGBA1C in the last 72 hours. CBG: Recent Labs  Lab 09/05/24 2205 09/06/24 0727  GLUCAP 103* 126*   Lipid Profile: No results for input(s): CHOL, HDL, LDLCALC, TRIG, CHOLHDL, LDLDIRECT in the last 72 hours. Thyroid Function Tests: No results for input(s): TSH, T4TOTAL, FREET4, T3FREE, THYROIDAB in the last 72 hours. Anemia Panel: No results for input(s): VITAMINB12, FOLATE,  FERRITIN, TIBC, IRON, RETICCTPCT in the last 72 hours. Sepsis Labs: No results for input(s): PROCALCITON, LATICACIDVEN in the last 168 hours.  No results found for this or any previous visit (from the past 240 hours).       Radiology Studies: US  RENAL Result Date: 09/05/2024 EXAM: US  Retroperitoneum Complete, Renal. 09/05/2024 10:20:00 PM TECHNIQUE: Real-time ultrasonography of the retroperitoneum renal was performed. COMPARISON: CT abdomen and pelvis 09/25/2024 CLINICAL HISTORY: 409830 AKI (acute kidney injury) 409830 AKI (acute kidney injury) FINDINGS: FINDINGS: RIGHT KIDNEY/URETER: Right kidney measures 13.4 x 5.7 x 5.6 cm, volume 223 ml. Normal cortical echogenicity. No hydronephrosis. Calcified stone within the right kidney measuring up to 16 mm. Exophytic right renal lesion visualized on CT not well visualized on ultrasound. LEFT KIDNEY/URETER: Left kidney measures 13.1 x 6.1 x 4.2 cm, volume 175 ml. Normal cortical echogenicity. No hydronephrosis. No calculus. Simple 3.4 x 2.5 x 2.8 cm renal cyst in the left kidney. BLADDER: Unremarkable appearance of the bladder. IMPRESSION: 1. Nonobstructive calcified right renal stone measuring up to 16 mm. 2. Exophytic right renal lesion visualized on CT not well visualized on ultrasound. Recommend nonemergent renal  protocol MRI. Electronically signed by: Morgane Naveau MD 09/05/2024 10:29 PM EST RP Workstation: [id number]   CT L-SPINE NO CHARGE Result Date: 09/05/2024 EXAM: CT OF THE LUMBAR SPINE WITHOUT CONTRAST 09/05/2024 07:41:55 PM TECHNIQUE: CT of the lumbar spine was performed without the administration of intravenous contrast. Multiplanar reformatted images are provided for review. Automated exposure control, iterative reconstruction, and/or weight based adjustment of the mA/kV was utilized to reduce the radiation dose to as low as reasonably achievable. COMPARISON: None available. CLINICAL HISTORY: Fall, low back twisting injury.  FINDINGS: BONES AND ALIGNMENT: Normal vertebral body heights. Anterior lumbar discectomy and fusion with interbody bone cages seen at L3-L5. Incorporation of interbody bone graft and bridging callus identified at L4-L5. Bridging callus identified involving the posterolateral aspects of the vertebral bodies bilaterally at L3-L4. Mild straightening of the operative segment. Left L3 hemilaminectomy. Left L4 hemilaminectomy. No acute fracture or listhesis of the lumbar spine. No suspicious bone lesion. DEGENERATIVE CHANGES: Disc space narrowing and endplate remodeling at L5-S1 are present, in keeping with changes of degenerative disc disease moderate degenerative disc disease. Moderate degenerative changes are seen throughout the remainder of the visualized thoracolumbar spine. T12-L1: Normal. L1-L2: Mild bilateral facet arthrosis with possible left synovial cyst resulting in mild left neural foraminal narrowing. The right neural foramen is widely patent. Broad-based disc bulge results in mild central canal stenosis within the subforaminal zone. L2-L3: Small bilateral facet arthrosis. Broad-based disc bulge. Moderate central canal stenosis within the subarticular zone. No significant neural foraminal narrowing. L3-L4: Anterior discectomy and fusion. No significant neural foraminal narrowing. Left L3 hemilaminectomy. No significant canal stenosis. L4-L5: Anterior discectomy and fusion. Left L4 hemilaminectomy. No significant neural foraminal narrowing. No canal stenosis. L5-S1: Moderate right and mild-to-moderate left neural foraminal narrowing. A posterior disc osteophyte complex mildly effaces the lateral recesses bilaterally. No significant canal stenosis. Advanced bilateral facet arthrosis. SOFT TISSUES: 10 mm nonobstructing calculus within the visualized lower pole of the right kidney. Mild aortoiliac atherosclerotic calcification. No paraspinal inflammatory change or fluid collection identified. No acute  abnormality. IMPRESSION: 1. No acute fracture or listhesis of the lumbar spine. 2. Status post anterior lumbar discectomy and fusion with interbody bone cages at L3-L5, with incorporation of interbody bone graft and bridging callus at L4-5 and bridging callus at L3-4. Mild straightening of the operative segment. 3. Moderate degenerative disc disease at L5-S1 with moderate right and mild-to-moderate left neural foraminal narrowing. No significant canal stenosis. Electronically signed by: Dorethia Molt MD 09/05/2024 08:11 PM EST RP Workstation: [id number]   DG Thoracic Spine 2 View Result Date: 09/05/2024 CLINICAL DATA:  Clemens, pain EXAM: THORACIC SPINE 2 VIEWS COMPARISON:  None Available. FINDINGS: Frontal and lateral views of the thoracic spine are obtained. Alignment is anatomic. There are no acute displaced fractures. Mild spondylosis within the lower thoracic spine. The paraspinal soft tissues are unremarkable. IMPRESSION: 1. Lower thoracic spondylosis. 2. No acute fracture. Electronically Signed   By: Ozell Daring M.D.   On: 09/05/2024 20:11   CT ABDOMEN PELVIS WO CONTRAST Result Date: 09/05/2024 EXAM: CT ABDOMEN AND PELVIS WITHOUT CONTRAST 09/05/2024 07:41:55 PM TECHNIQUE: CT of the abdomen and pelvis was performed without the administration of intravenous contrast. Multiplanar reformatted images are provided for review. Automated exposure control, iterative reconstruction, and/or weight-based adjustment of the mA/kV was utilized to reduce the radiation dose to as low as reasonably achievable. COMPARISON: 04/17/2024. Also compared to renal sonogram of 04/17/2024 and prior examination of 06/29/2021. CLINICAL HISTORY: Abdominal pain, acute, nonlocalized. FINDINGS: LOWER CHEST:  No acute abnormality. LIVER: The liver is unremarkable. GALLBLADDER AND BILE DUCTS: Gallbladder is unremarkable. No biliary ductal dilatation. SPLEEN: No acute abnormality. PANCREAS: No acute abnormality. ADRENAL GLANDS: No acute  abnormality. KIDNEYS, URETERS AND BLADDER: Moderate bilateral perinephric stranding is present, nonspecific, but stable since prior examination. A simple cortical cyst is seen within the interpolar region of the left kidney, for which no follow-up imaging is recommended. An indeterminate 12 mm exophytic lesion is seen arising from the lower pole of the right kidney, which was not visualized on the prior renal sonogram of 04/17/2024, and has enlarged slightly since her prior examination of 06/29/2021, with this measured 8 mm. Further evaluation with pre and post contrast MRI or CT should be considered. MRI is preferred in younger patients (due to lack of ionizing radiation) and for evaluating calcified lesion(s). Multiple nonobstructing renal calculi are seen within the right kidney measuring up to 10 cm. No hydronephrosis. No ureteral calculi. The bladder is unremarkable. GI AND BOWEL: Appendix absent. The stomach, small bowel, and large bowel are otherwise unremarkable. There is no bowel obstruction. PERITONEUM AND RETROPERITONEUM: No ascites. No free air. VASCULATURE: Mild aortoiliac atherosclerotic calcification. No aortic aneurysm. Aorta is normal in caliber. LYMPH NODES: No lymphadenopathy. REPRODUCTIVE ORGANS: No acute abnormality. BONES AND SOFT TISSUES: L3-L5 anterior lumbar fusion with instrumentation has been performed with solid incorporation of interbody bone graft and bridging callus identified. Osseous structures are otherwise age-appropriate. No acute bone abnormality. Tiny fat-containing umbilical hernia. IMPRESSION: 1. No acute findings. 2. Indeterminate 12 mm exophytic lesion in the lower pole of the right kidney, enlarged since prior examination; recommend nonemergent renal protocol MRI (preferred) or CT without and with contrast for further characterization. MRI is preferred in younger patients and for evaluating calcified lesions. 3. Multiple nonobstructing right renal calculi. No hydronephrosis  or ureteral calculi. Electronically signed by: Dorethia Molt MD 09/05/2024 08:00 PM EST RP Workstation: [id number]   CT Cervical Spine Wo Contrast Result Date: 09/05/2024 EXAM: CT CERVICAL SPINE WITHOUT CONTRAST 09/05/2024 07:41:55 PM TECHNIQUE: CT of the cervical spine was performed without the administration of intravenous contrast. Multiplanar reformatted images are provided for review. Automated exposure control, iterative reconstruction, and/or weight based adjustment of the mA/kV was utilized to reduce the radiation dose to as low as reasonably achievable. COMPARISON: None available. CLINICAL HISTORY: Neck trauma, midline tenderness (Age 46-64y). FINDINGS: CERVICAL SPINE: BONES AND ALIGNMENT: Cervical alignment is normal. Mild reversal of the normal cervical lordosis. No listhesis. The atlantodental interval is not widened. No acute fracture or traumatic malalignment. DEGENERATIVE CHANGES: Disc space narrowing and endplate remodeling are seen at C3-C4 and C5-C7 in keeping with changes of advanced degenerative disc disease. Degenerative ankylosis of the vertebral bodies and facet joints of C6-C7. Bridging anterior disc osteophyte seen at C5-C7. Posterior disc osteophyte complex at C5-C6 results in moderate central canal stenosis with an AP diameter of the spinal canal of approximately 6-7 mm and flattening of the thecal sac. The spinal canal is otherwise widely patent. Multilevel uncovertebral and facet arthrosis results in moderate bilateral neural foraminal narrowing at C3-C4 and C6-C7, and mild bilateral neural foraminal narrowing at C5-C6. SOFT TISSUES: No prevertebral soft tissue swelling. There is asymmetric thickening of the left base of the tongue which is not well characterized on this examination; however, an infiltrative mass in this region is not excluded. Correlation with direct visualization and/or oral contrast enhanced CT examination of the neck soft tissues is recommended for further  characterization. IMPRESSION: 1. No acute abnormality of  the cervical spine related to the reported neck trauma. 2. No acute findings. multilevel degenerative disc and degenerative joint disease with resultant mild-to-moderate multilevel neuroforaminal narrowing and moderate central canal stenosis at C5-6 as noted above. 3. Asymmetric thickening of the left base of the tongue, not well characterized on this examination. An infiltrative mass is not excluded. Recommend direct visualization and/or non-emergent contrast-enhanced CT of the neck soft tissues for further characterization. Electronically signed by: Dorethia Molt MD 09/05/2024 07:51 PM EST RP Workstation: [id number]   CT Head Wo Contrast Result Date: 09/05/2024 EXAM: CT HEAD WITHOUT CONTRAST 09/05/2024 07:41:55 PM TECHNIQUE: CT of the head was performed without the administration of intravenous contrast. Automated exposure control, iterative reconstruction, and/or weight based adjustment of the mA/kV was utilized to reduce the radiation dose to as low as reasonably achievable. COMPARISON: None available. CLINICAL HISTORY: Head trauma, moderate-severe. FINDINGS: BRAIN AND VENTRICLES: No acute hemorrhage. No evidence of acute infarct. Encephalomalacia involving the globus pallidus bilaterally may reflect a sequela of remote hypoxic / ischemic encephalopathy and/or carbon monoxide poisoning. Remote-appearing infarct within the right cerebellar hemisphere. No hydrocephalus. No extra-axial collection. No mass effect or midline shift. ORBITS: No acute abnormality. SINUSES: No acute abnormality. SOFT TISSUES AND SKULL: No acute soft tissue abnormality. No skull fracture. IMPRESSION: 1. No acute intracranial abnormality. 2. Encephalomalacia involving the globus pallidus bilaterally, possibly related to remote hypoxic/ischemic encephalopathy or carbon monoxide poisoning. 3. Remote infarct within the right cerebellar hemisphere. Electronically signed by: Dorethia Molt MD 09/05/2024 07:46 PM EST RP Workstation: [id number]        Scheduled Meds:  artificial tears  1 drop Left Eye QID   aspirin  EC  81 mg Oral Daily   atorvastatin   40 mg Oral QHS   brimonidine   1 drop Left Eye BID   DULoxetine   60 mg Oral Daily   heparin   5,000 Units Subcutaneous Q8H   insulin  aspart  0-5 Units Subcutaneous QHS   insulin  aspart  0-9 Units Subcutaneous TID WC   linaclotide   145 mcg Oral QAC breakfast   melatonin  10 mg Oral QHS   pantoprazole   40 mg Oral BID AC   senna-docusate  1 tablet Oral BID   sertraline   50 mg Oral Daily   sodium chloride  flush  3 mL Intravenous Q12H   tamsulosin   0.4 mg Oral QHS   traZODone   100 mg Oral QHS   Continuous Infusions:        Sophie Mao, MD [facility] 09/06/2024, 10:12 AM

## 2024-09-06 NOTE — Plan of Care (Signed)
  Problem: Education: Goal: Ability to describe self-care measures that may prevent or decrease complications (Diabetes Survival Skills Education) will improve Outcome: Progressing Goal: Individualized Educational Video(s) Outcome: Progressing   Problem: Coping: Goal: Ability to adjust to condition or change in health will improve Outcome: Progressing   Problem: Fluid Volume: Goal: Ability to maintain a balanced intake and output will improve Outcome: Progressing   Problem: Health Behavior/Discharge Planning: Goal: Ability to identify and utilize available resources and services will improve Outcome: Progressing Goal: Ability to manage health-related needs will improve Outcome: Progressing   Problem: Metabolic: Goal: Ability to maintain appropriate glucose levels will improve Outcome: Progressing   Problem: Nutritional: Goal: Maintenance of adequate nutrition will improve Outcome: Progressing Goal: Progress toward achieving an optimal weight will improve Outcome: Progressing   Problem: Skin Integrity: Goal: Risk for impaired skin integrity will decrease Outcome: Progressing   Problem: Tissue Perfusion: Goal: Adequacy of tissue perfusion will improve Outcome: Progressing   Problem: Education: Goal: Knowledge of condition and prescribed therapy will improve Outcome: Progressing   Problem: Cardiac: Goal: Will achieve and/or maintain adequate cardiac output Outcome: Progressing   Problem: Physical Regulation: Goal: Complications related to the disease process, condition or treatment will be avoided or minimized Outcome: Progressing   Problem: Education: Goal: Knowledge of General Education information will improve Description: Including pain rating scale, medication(s)/side effects and non-pharmacologic comfort measures Outcome: Progressing   Problem: Health Behavior/Discharge Planning: Goal: Ability to manage health-related needs will improve Outcome:  Progressing   Problem: Clinical Measurements: Goal: Ability to maintain clinical measurements within normal limits will improve Outcome: Progressing Goal: Will remain free from infection Outcome: Progressing Goal: Diagnostic test results will improve Outcome: Progressing Goal: Respiratory complications will improve Outcome: Progressing Goal: Cardiovascular complication will be avoided Outcome: Progressing   Problem: Activity: Goal: Risk for activity intolerance will decrease Outcome: Progressing   Problem: Nutrition: Goal: Adequate nutrition will be maintained Outcome: Progressing   Problem: Coping: Goal: Level of anxiety will decrease Outcome: Progressing   Problem: Elimination: Goal: Will not experience complications related to bowel motility Outcome: Progressing Goal: Will not experience complications related to urinary retention Outcome: Progressing   Problem: Pain Managment: Goal: General experience of comfort will improve and/or be controlled Outcome: Progressing   Problem: Safety: Goal: Ability to remain free from injury will improve Outcome: Progressing   Problem: Skin Integrity: Goal: Risk for impaired skin integrity will decrease Outcome: Progressing

## 2024-09-06 NOTE — Care Management Obs Status (Signed)
 MEDICARE OBSERVATION STATUS NOTIFICATION   Patient Details  Name: Brett White MRN: [id number] Date of Birth: 1961-01-13   Medicare Observation Status Notification Given:  Yes    Sonda Manuella Quill, RN 09/06/2024, 5:18 PM

## 2024-09-07 LAB — SODIUM, URINE, RANDOM: Sodium, Ur: 162 mmol/L

## 2024-09-07 LAB — BASIC METABOLIC PANEL WITH GFR
Anion gap: 7 (ref 5–15)
BUN: 16 mg/dL (ref 8–23)
CO2: 28 mmol/L (ref 22–32)
Calcium: 8.8 mg/dL — ABNORMAL LOW (ref 8.9–10.3)
Chloride: 105 mmol/L (ref 98–111)
Creatinine, Ser: 1.09 mg/dL (ref 0.61–1.24)
GFR, Estimated: >60 mL/min (ref >60)
Glucose, Bld: 112 mg/dL — ABNORMAL HIGH (ref 70–99)
Potassium: 4.2 mmol/L (ref 3.5–5.1)
Sodium: 140 mmol/L (ref 135–145)

## 2024-09-07 LAB — URINALYSIS, ROUTINE W REFLEX MICROSCOPIC
Bacteria, UA: NONE SEEN
Bilirubin Urine: NEGATIVE
Glucose, UA: >=500 mg/dL — AB
Hgb urine dipstick: NEGATIVE
Ketones, ur: NEGATIVE mg/dL
Leukocytes,Ua: NEGATIVE
Nitrite: NEGATIVE
Protein, ur: NEGATIVE mg/dL
Specific Gravity, Urine: 1.013 (ref 1.005–1.030)
pH: 5 (ref 5.0–8.0)

## 2024-09-07 LAB — GLUCOSE, CAPILLARY
Glucose-Capillary: 112 mg/dL — ABNORMAL HIGH (ref 70–99)
Glucose-Capillary: 138 mg/dL — ABNORMAL HIGH (ref 70–99)
Glucose-Capillary: 130 mg/dL — ABNORMAL HIGH (ref 70–99)
Glucose-Capillary: 123 mg/dL — ABNORMAL HIGH (ref 70–99)
Glucose-Capillary: 117 mg/dL — ABNORMAL HIGH (ref 70–99)

## 2024-09-07 LAB — CREATININE, URINE, RANDOM: Creatinine, Urine: 55 mg/dL

## 2024-09-07 LAB — MAGNESIUM: Magnesium: 2.2 mg/dL (ref 1.7–2.4)

## 2024-09-07 MED ORDER — SODIUM CHLORIDE 0.9 % IV SOLN: INTRAVENOUS | Status: DC

## 2024-09-07 MED ORDER — PANTOPRAZOLE SODIUM 40 MG IV SOLR
40.0000 mg | Freq: Two times a day (BID) | INTRAVENOUS | Status: DC
  Administered 2024-09-07 – 2024-09-09 (×4): 40 mg via INTRAVENOUS
  Filled 2024-09-07 (×4): qty 10

## 2024-09-07 MED ORDER — ONDANSETRON HCL 4 MG/2ML IJ SOLN
4.0000 mg | Freq: Four times a day (QID) | INTRAMUSCULAR | Status: DC
  Administered 2024-09-07 – 2024-09-09 (×8): 4 mg via INTRAVENOUS
  Filled 2024-09-07 (×8): qty 2

## 2024-09-07 MED ORDER — METOCLOPRAMIDE HCL 5 MG/ML IJ SOLN
10.0000 mg | Freq: Four times a day (QID) | INTRAMUSCULAR | Status: DC | PRN
  Administered 2024-09-08: 10 mg via INTRAVENOUS
  Filled 2024-09-07: qty 2

## 2024-09-07 MED ORDER — ALUM & MAG HYDROXIDE-SIMETH 200-200-20 MG/5ML PO SUSP: 15.0000 mL | ORAL | Status: DC | PRN

## 2024-09-07 NOTE — Plan of Care (Signed)
  Problem: Education: Goal: Ability to describe self-care measures that may prevent or decrease complications (Diabetes Survival Skills Education) will improve Outcome: Progressing   Problem: Fluid Volume: Goal: Ability to maintain a balanced intake and output will improve Outcome: Progressing   Problem: Health Behavior/Discharge Planning: Goal: Ability to identify and utilize available resources and services will improve Outcome: Progressing   Problem: Metabolic: Goal: Ability to maintain appropriate glucose levels will improve Outcome: Progressing   Problem: Nutritional: Goal: Maintenance of adequate nutrition will improve Outcome: Progressing Goal: Progress toward achieving an optimal weight will improve Outcome: Progressing   Problem: Skin Integrity: Goal: Risk for impaired skin integrity will decrease Outcome: Progressing   Problem: Tissue Perfusion: Goal: Adequacy of tissue perfusion will improve Outcome: Progressing   Problem: Activity: Goal: Risk for activity intolerance will decrease Outcome: Progressing   Problem: Coping: Goal: Level of anxiety will decrease Outcome: Progressing   Problem: Elimination: Goal: Will not experience complications related to bowel motility Outcome: Progressing   Problem: Pain Managment: Goal: General experience of comfort will improve and/or be controlled Outcome: Progressing   Problem: Safety: Goal: Ability to remain free from injury will improve Outcome: Progressing   Problem: Skin Integrity: Goal: Risk for impaired skin integrity will decrease Outcome: Progressing

## 2024-09-07 NOTE — TOC Initial Note (Signed)
 Transition of Care [facility]) - Initial/Assessment Note    Patient Details  Name: Brett White MRN: [id number] Date of Birth: [birth year]  [facility]) CM/SW Contact:    Doneta Glenys DASEN, RN Phone Number: 09/07/2024, 12:23 PM  Clinical Narrative:                 CM met with patient in the room. 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 CM contacted Karrin Glenys to confirm and can return to [place]. Updated FL2 completed.  Expected Discharge Plan: Long Term Acute Care (LTAC) Barriers to Discharge: Continued Medical Work up   Patient Goals and CMS Choice Patient states their goals for this hospitalization and ongoing recovery are:: Return to [facility].gov Compare Post Acute Care list provided to:: Patient Choice offered to / list presented to : Patient [place] ownership interest in [facility].provided to:: Patient    Expected Discharge Plan and Services In-house Referral: NA Discharge Planning Services: CM Consult   Living arrangements for the past 2 months: Skilled Nursing Facility (LTC [place])                 DME Arranged: N/A DME Agency: NA       HH Arranged: NA HH Agency: NA        Prior Living Arrangements/Services Living arrangements for the past 2 months: Skilled Holiday Representative (LTC [place]) Lives with:: Facility Resident Patient language and need for interpreter reviewed:: Yes Do you feel safe going back to the place where you live?: Yes      Need for Family Participation in Patient Care: No (Comment) Care giver support system in place?: Yes (comment) Current home services: DME ([facility], [facility]) Criminal Activity/Legal Involvement Pertinent to Current Situation/Hospitalization: No - Comment as needed  Activities of Daily Living   ADL Screening (condition at time of admission) Independently performs ADLs?: Yes (appropriate for developmental age) Is the patient deaf or have difficulty hearing?: No Does the  patient have difficulty seeing, even when wearing glasses/contacts?: Yes Does the patient have difficulty concentrating, remembering, or making decisions?: No  Permission Sought/Granted Permission sought to share information with : Case Manager Permission granted to share information with : Yes, Verbal Permission Granted  Share Information with NAME: Monteith,Michelle (Relative)  [phone]  Permission granted to share info w AGENCY: [facility]        Emotional Assessment Appearance:: Appears stated age Attitude/Demeanor/Rapport: Gracious Affect (typically observed): Appropriate Orientation: : Oriented to Self, Oriented to Place, Oriented to  Time, Oriented to Situation Alcohol  / Substance Use: Not Applicable Psych Involvement: No (comment)  Admission diagnosis:  Kidney lesion [N28.9] Tongue lesion [K14.8] AKI (acute kidney injury) [N17.9] Syncope, unspecified syncope type [R55] Nausea and vomiting, unspecified vomiting type [R11.2] Patient Active Problem List   Diagnosis Date Noted   AKI (acute kidney injury) 09/05/2024   Syncope 09/05/2024   History of CVA (cerebrovascular accident) 09/05/2024   Lesion of right native kidney 09/05/2024   Acute renal failure superimposed on stage 2 chronic kidney disease 04/11/2024   SIRS (systemic inflammatory response syndrome) (HCC) 04/11/2024   Generalized anxiety disorder 03/20/2024   Chronic pain syndrome 03/20/2024   History of abdominal aortic aneurysm (AAA) 03/20/2024   Hyperlipidemia associated with type 2 diabetes mellitus (HCC) 03/20/2024   Narcotic dependence (HCC) 03/20/2024   Peripheral neuropathy 03/20/2024   Reactive airway disease 03/20/2024   Insomnia 03/20/2024   Unspecified open wound, right foot, initial encounter 05/14/2023   Acute respiratory failure with  hypoxia (HCC) 11/16/2022   GERD without esophagitis 06/11/2022   Mixed diabetic hyperlipidemia associated with type 2 diabetes mellitus (HCC) 06/11/2022    Adjustment disorder with depressed mood 05/23/2022   Chronic hyponatremia 12/09/2021   Uncontrolled type 2 diabetes mellitus with hyperglycemia, without long-term current use of insulin  (HCC) 12/09/2021   S/P BKA (below knee amputation) unilateral, left (HCC) 04/05/2021   Insulin  dependent type 2 diabetes mellitus (HCC) 02/03/2021   Hypertension associated with diabetes (HCC) 02/03/2021   Diabetic polyneuropathy associated with type 2 diabetes mellitus (HCC) 02/03/2021   PCP:  [facility]:   [id number] [place], [place] - [address] AT [facility] OF [address] [place] [address] Phone: [phone] Fax: [phone]  [facility] - [place], [place] - [address] [place] [address] Phone: [phone] Fax: [phone]     Social Drivers of Health (SDOH) Social History: SDOH Screenings   Food Insecurity: No Food Insecurity (09/05/2024)  Housing: Low Risk  (09/05/2024)  Transportation Needs: No Transportation Needs (09/05/2024)  Utilities: Not At Risk (09/05/2024)  Tobacco Use: Low Risk  (09/05/2024)   SDOH Interventions:     Readmission Risk Interventions    04/21/2024    2:05 PM 04/13/2024    3:04 PM 11/16/2022    2:57 PM  Readmission Risk Prevention Plan  Transportation Screening Complete Complete Complete  Medication Review Oceanographer) Complete Complete Complete  PCP or Specialist appointment within 3-5 days of discharge Complete Complete Complete  HRI or Home Care Consult Complete Complete Complete  SW Recovery Care/Counseling Consult Complete Complete Complete  Palliative Care Screening Not Applicable Complete Complete  Skilled Nursing Facility Complete Complete Complete

## 2024-09-07 NOTE — Progress Notes (Signed)
 OT Screen Note  Patient Details Name: Brett White MRN: [id number] DOB: January 08, 1961   Cancelled Treatment:    Reason Eval/Treat Not Completed: OT screened, no needs identified, will sign off Patient politely declining OT evaluation. Patient stating that he feels back to his baseline, and is not in need of acute OT services at this time. OT will sign off, please re-consult if further acute OT needs arise.   Ronal Gift E. Breia Ocampo, OTR/L Acute Rehabilitation Services [phone]   Ronal Gift Salt 09/07/2024, 9:25 AM

## 2024-09-07 NOTE — Progress Notes (Signed)
 PROGRESS NOTE    Brett White  FMW:[id number] DOB: [birth year] DOA: 09/05/2024 PCP: [facility]   Brief Narrative:  63 y.o. male with medical history significant for diabetes mellitus type 2, HTN, hyperlipidemia, history of unspecified CVA, chronic pain on Dilaudid , s/p left BKA, esophageal dysmotility, depression/anxiety presented from SNF for evaluation of syncopal episode along with nausea and vomiting.  On presentation, initial BP was 98/60.  Creatinine 1.97 , Lipase 22, WBC 7.1.  CT head without contrast showed no acute intracranial abnormality.  CT cervical spine without contrast showed no acute cervical spine abnormality.  CT L-spine showed no acute fracture or listhesis of the lumbar spine.  CT of abdomen and pelvis without contrast showed no acute findings.  Thoracic spine x-ray was negative for fracture.  He was started on IV fluids and antiemetics.  Assessment & Plan:   Acute kidney injury -Possibly prerenal from GI losses.  Creatinine 1.97 compared to baseline of 1.1-1.4.  Imaging as above.  Renal ultrasound showed no hydronephrosis but showed exophytic right renal lesion and recommended nonemergent renal protocol MRI. - Creatinine improving to 1.09 today.  Resume gentle hydration as patient was vomiting earlier this morning again.  Repeat a.m. labs - Lasix , losartan  and Farxiga  on hold  Syncope - Possibly orthostatic.  Imaging negative for acute injury.  Continue orthostatic vital monitoring.  Patient declined PT/OT eval.  IV fluid as above.  Antihypertensives on hold for now  Nausea and vomiting - Questionable cause.  Had vomiting this morning.  Will switch back to clear liquid diet.  Should Zofran  to around-the-clock for the next 24 hours.  Add Reglan  as needed.  Switch Protonix  to IV.  Add Maalox as needed.  Repeat a.m. LFTs.  Admission LFTs were normal.  Hypertension -Monitor blood pressure.  Antihypertensive plan as above  Diabetes mellitus type 2 - Home  meds on hold.  Continue CBGs with SSI  Hyperlipidemia -Continue statin  Anxiety/depression - Continue duloxetine , sertraline  and trazodone   History of unspecified CVA - Continue statin and aspirin   Chronic pain syndrome with opiate dependence -Continue current pain management regimen.  Outpatient follow-up with pain management  BPH - Continue Flomax   Right kidney lesion -CT showed indeterminate 12 mm exophytic lesion in the lower pole of the right kidney, enlarged since prior exam. Nonemergent follow-up renal protocol MRI or CT with and without contrast recommended for further evaluation.  This can happen as an outpatient  Asymmetric thickening of the left base of tongue -Incidental finding on CT C-spine.  No obvious abnormality appreciated on visual inspection.  Consider nonemergent contrast-enhanced CT of the neck soft tissues as an outpatient for further characterization.   Obesity class I - Outpatient follow-up  DVT prophylaxis: Heparin  subcutaneous Code Status: Full Family Communication: None at bedside Disposition Plan: Status is: Observation The patient will require care spanning > 2 midnights and should be moved to inpatient because: Of severity of illness    Consultants: None  Procedures: None  Antimicrobials: None   Subjective: Patient seen and examined at bedside.  Does not feel well and had vomiting earlier this morning.  Does not want to eat anything this morning.  No fever, chest pain or shortness of breath reported. Objective: Vitals:   09/06/24 0925 09/06/24 1339 09/06/24 2044 09/07/24 0552  BP: 124/74 106/65 (!) 142/87 (!) 147/76  Pulse: 86 86 88 84  Resp: 18 16 16 14   Temp: 98 F (36.7 C) 98.3 F (36.8 C) 97.9 F (36.6 C) 98.4 F (  36.9 C)  TempSrc:   Oral   SpO2: 96% 94% 92% 95%  Weight:      Height:        Intake/Output Summary (Last 24 hours) at 09/07/2024 1126 Last data filed at 09/07/2024 9374 Gross per 24 hour  Intake --  Output  1975 ml  Net -1975 ml   Filed Weights   09/05/24 2234 09/06/24 0500  Weight: 115.2 kg 116.5 kg    Examination:  General: On 2 L oxygen via nasal cannula.  No distress ENT/neck: No thyromegaly.  JVD is not elevated  respiratory: Decreased breath sounds at bases bilaterally with some crackles; no wheezing  CVS: S1-S2 heard, rate controlled currently Abdominal: Soft, obese, nontender, slightly distended; no organomegaly, bowel sounds are heard Extremities: Left BKA present  CNS: Awake and alert.  Slow to respond.  Poor historian.  No focal neurologic deficit.  Moves extremities Lymph: No obvious lymphadenopathy Skin: No obvious ecchymosis/lesions  psych: Mostly flat affect.  Currently not agitated     Data Reviewed: I have personally reviewed following labs and imaging studies  CBC: Recent Labs  Lab 09/05/24 1850 09/05/24 1857 09/06/24 0548  WBC 7.1  --  5.7  NEUTROABS 5.3  --   --   HGB 11.7* 12.9* 11.0*  HCT 38.4* 38.0* 36.8*  MCV 80.3  --  81.8  PLT 275  --  233   Basic Metabolic Panel: Recent Labs  Lab 09/05/24 1850 09/05/24 1857 09/06/24 0548 09/07/24 0540  NA 141 141 141 140  K 4.0 4.0 4.2 4.2  CL 103 103 105 105  CO2 27  --  29 28  GLUCOSE 100* 99 96 112*  BUN 22 23 19 16   CREATININE 1.97* 2.20* 1.59* 1.09  CALCIUM  8.9  --  8.9 8.8*  MG  --   --   --  2.2   GFR: Estimated Creatinine Clearance: 96.5 mL/min (by C-G formula based on SCr of 1.09 mg/dL). Liver Function Tests: Recent Labs  Lab 09/05/24 1850  AST 23  ALT 13  ALKPHOS 123  BILITOT 0.3  PROT 7.1  ALBUMIN  3.9   Recent Labs  Lab 09/05/24 1850  LIPASE 22   No results for input(s): AMMONIA in the last 168 hours. Coagulation Profile: No results for input(s): INR, PROTIME in the last 168 hours. Cardiac Enzymes: No results for input(s): CKTOTAL, CKMB, CKMBINDEX, TROPONINI in the last 168 hours. BNP (last 3 results) No results for input(s): PROBNP in the last 8760  hours. HbA1C: No results for input(s): HGBA1C in the last 72 hours. CBG: Recent Labs  Lab 09/06/24 1150 09/06/24 1639 09/06/24 2052 09/07/24 0554 09/07/24 0740  GLUCAP 167* 142* 159* 112* 138*   Lipid Profile: No results for input(s): CHOL, HDL, LDLCALC, TRIG, CHOLHDL, LDLDIRECT in the last 72 hours. Thyroid Function Tests: No results for input(s): TSH, T4TOTAL, FREET4, T3FREE, THYROIDAB in the last 72 hours. Anemia Panel: No results for input(s): VITAMINB12, FOLATE, FERRITIN, TIBC, IRON, RETICCTPCT in the last 72 hours. Sepsis Labs: No results for input(s): PROCALCITON, LATICACIDVEN in the last 168 hours.  No results found for this or any previous visit (from the past 240 hours).       Radiology Studies: US  RENAL Result Date: 09/05/2024 EXAM: US  Retroperitoneum Complete, Renal. 09/05/2024 10:20:00 PM TECHNIQUE: Real-time ultrasonography of the retroperitoneum renal was performed. COMPARISON: CT abdomen and pelvis 09/25/2024 CLINICAL HISTORY: [id number] AKI (acute kidney injury) 409830 AKI (acute kidney injury) FINDINGS: FINDINGS: RIGHT KIDNEY/URETER: Right kidney measures 13.4  x 5.7 x 5.6 cm, volume 223 ml. Normal cortical echogenicity. No hydronephrosis. Calcified stone within the right kidney measuring up to 16 mm. Exophytic right renal lesion visualized on CT not well visualized on ultrasound. LEFT KIDNEY/URETER: Left kidney measures 13.1 x 6.1 x 4.2 cm, volume 175 ml. Normal cortical echogenicity. No hydronephrosis. No calculus. Simple 3.4 x 2.5 x 2.8 cm renal cyst in the left kidney. BLADDER: Unremarkable appearance of the bladder. IMPRESSION: 1. Nonobstructive calcified right renal stone measuring up to 16 mm. 2. Exophytic right renal lesion visualized on CT not well visualized on ultrasound. Recommend nonemergent renal protocol MRI. Electronically signed by: Morgane Naveau MD 09/05/2024 10:29 PM EST RP Workstation: [id number]   CT L-SPINE NO  CHARGE Result Date: 09/05/2024 EXAM: CT OF THE LUMBAR SPINE WITHOUT CONTRAST 09/05/2024 07:41:55 PM TECHNIQUE: CT of the lumbar spine was performed without the administration of intravenous contrast. Multiplanar reformatted images are provided for review. Automated exposure control, iterative reconstruction, and/or weight based adjustment of the mA/kV was utilized to reduce the radiation dose to as low as reasonably achievable. COMPARISON: None available. CLINICAL HISTORY: Fall, low back twisting injury. FINDINGS: BONES AND ALIGNMENT: Normal vertebral body heights. Anterior lumbar discectomy and fusion with interbody bone cages seen at L3-L5. Incorporation of interbody bone graft and bridging callus identified at L4-L5. Bridging callus identified involving the posterolateral aspects of the vertebral bodies bilaterally at L3-L4. Mild straightening of the operative segment. Left L3 hemilaminectomy. Left L4 hemilaminectomy. No acute fracture or listhesis of the lumbar spine. No suspicious bone lesion. DEGENERATIVE CHANGES: Disc space narrowing and endplate remodeling at L5-S1 are present, in keeping with changes of degenerative disc disease moderate degenerative disc disease. Moderate degenerative changes are seen throughout the remainder of the visualized thoracolumbar spine. T12-L1: Normal. L1-L2: Mild bilateral facet arthrosis with possible left synovial cyst resulting in mild left neural foraminal narrowing. The right neural foramen is widely patent. Broad-based disc bulge results in mild central canal stenosis within the subforaminal zone. L2-L3: Small bilateral facet arthrosis. Broad-based disc bulge. Moderate central canal stenosis within the subarticular zone. No significant neural foraminal narrowing. L3-L4: Anterior discectomy and fusion. No significant neural foraminal narrowing. Left L3 hemilaminectomy. No significant canal stenosis. L4-L5: Anterior discectomy and fusion. Left L4 hemilaminectomy. No  significant neural foraminal narrowing. No canal stenosis. L5-S1: Moderate right and mild-to-moderate left neural foraminal narrowing. A posterior disc osteophyte complex mildly effaces the lateral recesses bilaterally. No significant canal stenosis. Advanced bilateral facet arthrosis. SOFT TISSUES: 10 mm nonobstructing calculus within the visualized lower pole of the right kidney. Mild aortoiliac atherosclerotic calcification. No paraspinal inflammatory change or fluid collection identified. No acute abnormality. IMPRESSION: 1. No acute fracture or listhesis of the lumbar spine. 2. Status post anterior lumbar discectomy and fusion with interbody bone cages at L3-L5, with incorporation of interbody bone graft and bridging callus at L4-5 and bridging callus at L3-4. Mild straightening of the operative segment. 3. Moderate degenerative disc disease at L5-S1 with moderate right and mild-to-moderate left neural foraminal narrowing. No significant canal stenosis. Electronically signed by: Dorethia Molt MD 09/05/2024 08:11 PM EST RP Workstation: [id number]   DG Thoracic Spine 2 View Result Date: 09/05/2024 CLINICAL DATA:  Clemens, pain EXAM: THORACIC SPINE 2 VIEWS COMPARISON:  None Available. FINDINGS: Frontal and lateral views of the thoracic spine are obtained. Alignment is anatomic. There are no acute displaced fractures. Mild spondylosis within the lower thoracic spine. The paraspinal soft tissues are unremarkable. IMPRESSION: 1. Lower thoracic spondylosis. 2. No acute  fracture. Electronically Signed   By: Ozell Daring M.D.   On: 09/05/2024 20:11   CT ABDOMEN PELVIS WO CONTRAST Result Date: 09/05/2024 EXAM: CT ABDOMEN AND PELVIS WITHOUT CONTRAST 09/05/2024 07:41:55 PM TECHNIQUE: CT of the abdomen and pelvis was performed without the administration of intravenous contrast. Multiplanar reformatted images are provided for review. Automated exposure control, iterative reconstruction, and/or weight-based adjustment of  the mA/kV was utilized to reduce the radiation dose to as low as reasonably achievable. COMPARISON: 04/17/2024. Also compared to renal sonogram of 04/17/2024 and prior examination of 06/29/2021. CLINICAL HISTORY: Abdominal pain, acute, nonlocalized. FINDINGS: LOWER CHEST: No acute abnormality. LIVER: The liver is unremarkable. GALLBLADDER AND BILE DUCTS: Gallbladder is unremarkable. No biliary ductal dilatation. SPLEEN: No acute abnormality. PANCREAS: No acute abnormality. ADRENAL GLANDS: No acute abnormality. KIDNEYS, URETERS AND BLADDER: Moderate bilateral perinephric stranding is present, nonspecific, but stable since prior examination. A simple cortical cyst is seen within the interpolar region of the left kidney, for which no follow-up imaging is recommended. An indeterminate 12 mm exophytic lesion is seen arising from the lower pole of the right kidney, which was not visualized on the prior renal sonogram of 04/17/2024, and has enlarged slightly since her prior examination of 06/29/2021, with this measured 8 mm. Further evaluation with pre and post contrast MRI or CT should be considered. MRI is preferred in younger patients (due to lack of ionizing radiation) and for evaluating calcified lesion(s). Multiple nonobstructing renal calculi are seen within the right kidney measuring up to 10 cm. No hydronephrosis. No ureteral calculi. The bladder is unremarkable. GI AND BOWEL: Appendix absent. The stomach, small bowel, and large bowel are otherwise unremarkable. There is no bowel obstruction. PERITONEUM AND RETROPERITONEUM: No ascites. No free air. VASCULATURE: Mild aortoiliac atherosclerotic calcification. No aortic aneurysm. Aorta is normal in caliber. LYMPH NODES: No lymphadenopathy. REPRODUCTIVE ORGANS: No acute abnormality. BONES AND SOFT TISSUES: L3-L5 anterior lumbar fusion with instrumentation has been performed with solid incorporation of interbody bone graft and bridging callus identified. Osseous  structures are otherwise age-appropriate. No acute bone abnormality. Tiny fat-containing umbilical hernia. IMPRESSION: 1. No acute findings. 2. Indeterminate 12 mm exophytic lesion in the lower pole of the right kidney, enlarged since prior examination; recommend nonemergent renal protocol MRI (preferred) or CT without and with contrast for further characterization. MRI is preferred in younger patients and for evaluating calcified lesions. 3. Multiple nonobstructing right renal calculi. No hydronephrosis or ureteral calculi. Electronically signed by: Dorethia Molt MD 09/05/2024 08:00 PM EST RP Workstation: [id number]   CT Cervical Spine Wo Contrast Result Date: 09/05/2024 EXAM: CT CERVICAL SPINE WITHOUT CONTRAST 09/05/2024 07:41:55 PM TECHNIQUE: CT of the cervical spine was performed without the administration of intravenous contrast. Multiplanar reformatted images are provided for review. Automated exposure control, iterative reconstruction, and/or weight based adjustment of the mA/kV was utilized to reduce the radiation dose to as low as reasonably achievable. COMPARISON: None available. CLINICAL HISTORY: Neck trauma, midline tenderness (Age 81-64y). FINDINGS: CERVICAL SPINE: BONES AND ALIGNMENT: Cervical alignment is normal. Mild reversal of the normal cervical lordosis. No listhesis. The atlantodental interval is not widened. No acute fracture or traumatic malalignment. DEGENERATIVE CHANGES: Disc space narrowing and endplate remodeling are seen at C3-C4 and C5-C7 in keeping with changes of advanced degenerative disc disease. Degenerative ankylosis of the vertebral bodies and facet joints of C6-C7. Bridging anterior disc osteophyte seen at C5-C7. Posterior disc osteophyte complex at C5-C6 results in moderate central canal stenosis with an AP diameter of the spinal canal  of approximately 6-7 mm and flattening of the thecal sac. The spinal canal is otherwise widely patent. Multilevel uncovertebral and facet  arthrosis results in moderate bilateral neural foraminal narrowing at C3-C4 and C6-C7, and mild bilateral neural foraminal narrowing at C5-C6. SOFT TISSUES: No prevertebral soft tissue swelling. There is asymmetric thickening of the left base of the tongue which is not well characterized on this examination; however, an infiltrative mass in this region is not excluded. Correlation with direct visualization and/or oral contrast enhanced CT examination of the neck soft tissues is recommended for further characterization. IMPRESSION: 1. No acute abnormality of the cervical spine related to the reported neck trauma. 2. No acute findings. multilevel degenerative disc and degenerative joint disease with resultant mild-to-moderate multilevel neuroforaminal narrowing and moderate central canal stenosis at C5-6 as noted above. 3. Asymmetric thickening of the left base of the tongue, not well characterized on this examination. An infiltrative mass is not excluded. Recommend direct visualization and/or non-emergent contrast-enhanced CT of the neck soft tissues for further characterization. Electronically signed by: Dorethia Molt MD 09/05/2024 07:51 PM EST RP Workstation: [id number]   CT Head Wo Contrast Result Date: 09/05/2024 EXAM: CT HEAD WITHOUT CONTRAST 09/05/2024 07:41:55 PM TECHNIQUE: CT of the head was performed without the administration of intravenous contrast. Automated exposure control, iterative reconstruction, and/or weight based adjustment of the mA/kV was utilized to reduce the radiation dose to as low as reasonably achievable. COMPARISON: None available. CLINICAL HISTORY: Head trauma, moderate-severe. FINDINGS: BRAIN AND VENTRICLES: No acute hemorrhage. No evidence of acute infarct. Encephalomalacia involving the globus pallidus bilaterally may reflect a sequela of remote hypoxic / ischemic encephalopathy and/or carbon monoxide poisoning. Remote-appearing infarct within the right cerebellar hemisphere. No  hydrocephalus. No extra-axial collection. No mass effect or midline shift. ORBITS: No acute abnormality. SINUSES: No acute abnormality. SOFT TISSUES AND SKULL: No acute soft tissue abnormality. No skull fracture. IMPRESSION: 1. No acute intracranial abnormality. 2. Encephalomalacia involving the globus pallidus bilaterally, possibly related to remote hypoxic/ischemic encephalopathy or carbon monoxide poisoning. 3. Remote infarct within the right cerebellar hemisphere. Electronically signed by: Dorethia Molt MD 09/05/2024 07:46 PM EST RP Workstation: [id number]        Scheduled Meds:  artificial tears  1 drop Left Eye QID   aspirin  EC  81 mg Oral Daily   atorvastatin   40 mg Oral QHS   brimonidine   1 drop Left Eye BID   DULoxetine   60 mg Oral Daily   heparin   5,000 Units Subcutaneous Q8H   insulin  aspart  0-5 Units Subcutaneous QHS   insulin  aspart  0-9 Units Subcutaneous TID WC   linaclotide   145 mcg Oral QAC breakfast   melatonin  10 mg Oral QHS   ondansetron  (ZOFRAN ) IV  4 mg Intravenous Q6H   pantoprazole   40 mg Oral BID AC   senna-docusate  1 tablet Oral BID   sertraline   50 mg Oral Daily   sodium chloride  flush  3 mL Intravenous Q12H   tamsulosin   0.4 mg Oral QHS   traZODone   100 mg Oral QHS   Continuous Infusions:        Sophie Mao, MD [facility] 09/07/2024, 11:26 AM

## 2024-09-07 NOTE — [place] FL2 (Signed)
 [place]  MEDICAID FL2 LEVEL OF CARE FORM     IDENTIFICATION  Patient Name: Brett White Birthdate: 1961/08/03 Sex: male Admission Date (Current Location): 09/05/2024  [facility] and [place] Number:  Producer, Television/film/video and Address:  [facility],  [address], [place] [address]      Provider Number: [id number]  Attending Physician Name and Address:  Cheryle Page, MD  Relative Name and Phone Number:       Current Level of Care: SNF Recommended Level of Care: Skilled Nursing Facility Prior Approval Number:    Date Approved/Denied:   PASRR Number:    Discharge Plan: SNF    Current Diagnoses: Patient Active Problem List   Diagnosis Date Noted   AKI (acute kidney injury) 09/05/2024   Syncope 09/05/2024   History of CVA (cerebrovascular accident) 09/05/2024   Lesion of right native kidney 09/05/2024   Acute renal failure superimposed on stage 2 chronic kidney disease 04/11/2024   SIRS (systemic inflammatory response syndrome) (HCC) 04/11/2024   Generalized anxiety disorder 03/20/2024   Chronic pain syndrome 03/20/2024   History of abdominal aortic aneurysm (AAA) 03/20/2024   Hyperlipidemia associated with type 2 diabetes mellitus (HCC) 03/20/2024   Narcotic dependence (HCC) 03/20/2024   Peripheral neuropathy 03/20/2024   Reactive airway disease 03/20/2024   Insomnia 03/20/2024   Unspecified open wound, right foot, initial encounter 05/14/2023   Acute respiratory failure with hypoxia (HCC) 11/16/2022   GERD without esophagitis 06/11/2022   Mixed diabetic hyperlipidemia associated with type 2 diabetes mellitus (HCC) 06/11/2022   Adjustment disorder with depressed mood 05/23/2022   Chronic hyponatremia 12/09/2021   Uncontrolled type 2 diabetes mellitus with hyperglycemia, without long-term current use of insulin  (HCC) 12/09/2021   S/P BKA (below knee amputation) unilateral, left (HCC) 04/05/2021   Insulin  dependent type 2 diabetes mellitus (HCC)  02/03/2021   Hypertension associated with diabetes (HCC) 02/03/2021   Diabetic polyneuropathy associated with type 2 diabetes mellitus (HCC) 02/03/2021    Orientation RESPIRATION BLADDER Height & Weight     Self, Time, Place  Normal Continent Weight: 116.5 kg Height:  6' 2 (188 cm)  BEHAVIORAL SYMPTOMS/MOOD NEUROLOGICAL BOWEL NUTRITION STATUS      Continent Diet  AMBULATORY STATUS COMMUNICATION OF NEEDS Skin   Limited Assist Verbally Normal                       Personal Care Assistance Level of Assistance  Bathing, Feeding, Dressing Bathing Assistance: Limited assistance Feeding assistance: Independent Dressing Assistance: Limited assistance     Functional Limitations Info  Sight, Hearing, Speech Sight Info: Impaired Hearing Info: Impaired Speech Info: Adequate    SPECIAL CARE FACTORS FREQUENCY                       Contractures Contractures Info: Not present    Additional Factors Info  Code Status, Psychotropic, Allergies, Insulin  Sliding Scale Code Status Info: FULL Allergies Info: NKDA Psychotropic Info: DULoxetine  (CYMBALTA ) DR capsule 60 mg every morning,pregabalin  (LYRICA ) capsule 100 mg 2 times daily,sertraline  (ZOLOFT ) tablet 50 mg every morning,traZODone  (DESYREL ) tablet 75 mg daily at bedtime Insulin  Sliding Scale Info: nsulin lispro (HUMALOG KWIKPEN) 100 UNIT/ML KwikPen Inject 2 Units into the skin 3 (three) times daily as needed (per sliding scale). Inject per sliding scale: If 201-250 = 2 units; if 251-300 = 4 units; if 301-350 = 6 units; if 351-400 = 8 units; for greater than 401, notify provider.insulin  aspart (novoLOG ) injection 0-5 Units  0-5 Units, Subcutaneous, Daily at bedtime, First dose on Sat 09/05/24 at 2200  Correction coverage: HS scale  CBG < 70: Implement Hypoglycemia Standing Orders and refer to Hypoglycemia Standing Orders sidebar report  CBG 70 - 120: 0 units  CBG 121 - 150: 0 units  CBG 151 - 200: 0 units  CBG 201 - 250: 2 units  CBG  251 - 300: 3 units  CBG 301 - 350: 4 units  CBG 351 - 400: 5 units       Current Medications (09/07/2024):  This is the current hospital active medication list Current Facility-Administered Medications  Medication Dose Route Frequency Provider Last Rate Last Admin   acetaminophen  (TYLENOL ) tablet 650 mg  650 mg Oral Q6H PRN Patel, Vishal R, MD       Or   acetaminophen  (TYLENOL ) suppository 650 mg  650 mg Rectal Q6H PRN Patel, Vishal R, MD       artificial tears ophthalmic solution 1 drop  1 drop Left Eye QID Tobie Bloch R, MD   1 drop at 09/06/24 2143   aspirin  EC tablet 81 mg  81 mg Oral Daily Patel, Vishal R, MD   81 mg at 09/06/24 0843   atorvastatin  (LIPITOR) tablet 40 mg  40 mg Oral QHS Patel, Vishal R, MD   40 mg at 09/06/24 2141   bisacodyl  (DULCOLAX) EC tablet 5 mg  5 mg Oral Daily PRN Patel, Vishal R, MD       brimonidine  (ALPHAGAN ) 0.2 % ophthalmic solution 1 drop  1 drop Left Eye BID Tobie Bloch R, MD   1 drop at 09/06/24 2144   DULoxetine  (CYMBALTA ) DR capsule 60 mg  60 mg Oral Daily Patel, Vishal R, MD   60 mg at 09/06/24 0834   heparin  injection 5,000 Units  5,000 Units Subcutaneous Q8H Patel, Vishal R, MD   5,000 Units at 09/07/24 0618   HYDROmorphone  (DILAUDID ) tablet 4 mg  4 mg Oral Q6H PRN Patel, Vishal R, MD   4 mg at 09/07/24 9376   insulin  aspart (novoLOG ) injection 0-5 Units  0-5 Units Subcutaneous QHS Patel, Vishal R, MD       insulin  aspart (novoLOG ) injection 0-9 Units  0-9 Units Subcutaneous TID WC Patel, Vishal R, MD   1 Units at 09/07/24 0801   ipratropium-albuterol  (DUONEB) 0.5-2.5 (3) MG/3ML nebulizer solution 3 mL  3 mL Nebulization Q6H PRN Patel, Vishal R, MD       linaclotide  (LINZESS ) capsule 145 mcg  145 mcg Oral QAC breakfast Patel, Vishal R, MD   145 mcg at 09/07/24 0801   melatonin tablet 10 mg  10 mg Oral QHS Patel, Vishal R, MD   10 mg at 09/06/24 2141   ondansetron  (ZOFRAN ) tablet 4 mg  4 mg Oral Q6H PRN Patel, Vishal R, MD       Or   ondansetron   (ZOFRAN ) injection 4 mg  4 mg Intravenous Q6H PRN Patel, Vishal R, MD   4 mg at 09/07/24 0618   pantoprazole  (PROTONIX ) EC tablet 40 mg  40 mg Oral BID AC Patel, Vishal R, MD   40 mg at 09/07/24 0801   polyethylene glycol (MIRALAX  / GLYCOLAX ) packet 17 g  17 g Oral Daily PRN Patel, Vishal R, MD       senna-docusate (Senokot-S) tablet 1 tablet  1 tablet Oral BID Patel, Vishal R, MD   1 tablet at 09/06/24 2141   sertraline  (ZOLOFT ) tablet 50 mg  50 mg Oral Daily  Tobie Bloch R, MD   50 mg at 09/06/24 9165   sodium chloride  flush (NS) 0.9 % injection 3 mL  3 mL Intravenous Q12H Patel, Vishal R, MD   3 mL at 09/06/24 2145   tamsulosin  (FLOMAX ) capsule 0.4 mg  0.4 mg Oral QHS Patel, Vishal R, MD   0.4 mg at 09/06/24 2141   traZODone  (DESYREL ) tablet 100 mg  100 mg Oral QHS Patel, Vishal R, MD   100 mg at 09/06/24 2141     Discharge Medications: Please see discharge summary for a list of discharge medications.  Relevant Imaging Results:  Relevant Lab Results:   Additional Information SSN [id number]  Doneta Glenys DASEN, RN

## 2024-09-08 LAB — CBC WITH DIFFERENTIAL/PLATELET
Abs Immature Granulocytes: 0.01 K/uL (ref 0.00–0.07)
Basophils Absolute: 0 K/uL (ref 0.0–0.1)
Basophils Relative: 1 %
Eosinophils Absolute: 0.1 K/uL (ref 0.0–0.5)
Eosinophils Relative: 2 %
HCT: 38.7 % — ABNORMAL LOW (ref 39.0–52.0)
Hemoglobin: 11.6 g/dL — ABNORMAL LOW (ref 13.0–17.0)
Immature Granulocytes: 0 %
Lymphocytes Relative: 25 %
Lymphs Abs: 1.3 K/uL (ref 0.7–4.0)
MCH: 24.4 pg — ABNORMAL LOW (ref 26.0–34.0)
MCHC: 30 g/dL (ref 30.0–36.0)
MCV: 81.3 fL (ref 80.0–100.0)
Monocytes Absolute: 0.4 K/uL (ref 0.1–1.0)
Monocytes Relative: 9 %
Neutro Abs: 3.3 K/uL (ref 1.7–7.7)
Neutrophils Relative %: 63 %
Platelets: 241 K/uL (ref 150–400)
RBC: 4.76 MIL/uL (ref 4.22–5.81)
RDW: 15.3 % (ref 11.5–15.5)
WBC: 5.1 K/uL (ref 4.0–10.5)
nRBC: 0 % (ref 0.0–0.2)

## 2024-09-08 LAB — COMPREHENSIVE METABOLIC PANEL WITH GFR
ALT: 10 U/L (ref 0–44)
AST: 17 U/L (ref 15–41)
Albumin: 3.6 g/dL (ref 3.5–5.0)
Alkaline Phosphatase: 124 U/L (ref 38–126)
Anion gap: 11 (ref 5–15)
BUN: 13 mg/dL (ref 8–23)
CO2: 25 mmol/L (ref 22–32)
Calcium: 8.9 mg/dL (ref 8.9–10.3)
Chloride: 102 mmol/L (ref 98–111)
Creatinine, Ser: 1.02 mg/dL (ref 0.61–1.24)
GFR, Estimated: >60 mL/min (ref >60)
Glucose, Bld: 107 mg/dL — ABNORMAL HIGH (ref 70–99)
Potassium: 3.9 mmol/L (ref 3.5–5.1)
Sodium: 138 mmol/L (ref 135–145)
Total Bilirubin: 0.4 mg/dL (ref 0.0–1.2)
Total Protein: 6.7 g/dL (ref 6.5–8.1)

## 2024-09-08 LAB — LIPASE, BLOOD: Lipase: 15 U/L (ref 11–51)

## 2024-09-08 LAB — GLUCOSE, CAPILLARY
Glucose-Capillary: 98 mg/dL (ref 70–99)
Glucose-Capillary: 129 mg/dL — ABNORMAL HIGH (ref 70–99)
Glucose-Capillary: 180 mg/dL — ABNORMAL HIGH (ref 70–99)
Glucose-Capillary: 165 mg/dL — ABNORMAL HIGH (ref 70–99)
Glucose-Capillary: 161 mg/dL — ABNORMAL HIGH (ref 70–99)

## 2024-09-08 LAB — MAGNESIUM: Magnesium: 1.9 mg/dL (ref 1.7–2.4)

## 2024-09-08 MED ORDER — SUCRALFATE 1 GM/10ML PO SUSP
1.0000 g | Freq: Three times a day (TID) | ORAL | Status: DC
  Administered 2024-09-08 – 2024-09-09 (×4): 1 g via ORAL
  Filled 2024-09-08 (×4): qty 10

## 2024-09-08 NOTE — Plan of Care (Signed)
  Problem: Education: Goal: Ability to describe self-care measures that may prevent or decrease complications (Diabetes Survival Skills Education) will improve Outcome: Progressing   Problem: Coping: Goal: Ability to adjust to condition or change in health will improve Outcome: Progressing

## 2024-09-08 NOTE — Plan of Care (Signed)
  Problem: Education: Goal: Ability to describe self-care measures that may prevent or decrease complications (Diabetes Survival Skills Education) will improve Outcome: Progressing Goal: Individualized Educational Video(s) Outcome: Progressing   Problem: Coping: Goal: Ability to adjust to condition or change in health will improve Outcome: Progressing   Problem: Fluid Volume: Goal: Ability to maintain a balanced intake and output will improve Outcome: Progressing   Problem: Health Behavior/Discharge Planning: Goal: Ability to identify and utilize available resources and services will improve Outcome: Progressing Goal: Ability to manage health-related needs will improve Outcome: Progressing   Problem: Metabolic: Goal: Ability to maintain appropriate glucose levels will improve Outcome: Progressing   Problem: Nutritional: Goal: Maintenance of adequate nutrition will improve Outcome: Progressing Goal: Progress toward achieving an optimal weight will improve Outcome: Progressing   Problem: Skin Integrity: Goal: Risk for impaired skin integrity will decrease Outcome: Progressing   Problem: Tissue Perfusion: Goal: Adequacy of tissue perfusion will improve Outcome: Progressing   Problem: Education: Goal: Knowledge of condition and prescribed therapy will improve Outcome: Progressing   Problem: Cardiac: Goal: Will achieve and/or maintain adequate cardiac output Outcome: Progressing   Problem: Physical Regulation: Goal: Complications related to the disease process, condition or treatment will be avoided or minimized Outcome: Progressing   Problem: Education: Goal: Knowledge of General Education information will improve Description: Including pain rating scale, medication(s)/side effects and non-pharmacologic comfort measures Outcome: Progressing   Problem: Health Behavior/Discharge Planning: Goal: Ability to manage health-related needs will improve Outcome:  Progressing   Problem: Clinical Measurements: Goal: Ability to maintain clinical measurements within normal limits will improve Outcome: Progressing Goal: Will remain free from infection Outcome: Progressing Goal: Diagnostic test results will improve Outcome: Progressing Goal: Respiratory complications will improve Outcome: Progressing Goal: Cardiovascular complication will be avoided Outcome: Progressing   Problem: Activity: Goal: Risk for activity intolerance will decrease Outcome: Progressing   Problem: Nutrition: Goal: Adequate nutrition will be maintained Outcome: Progressing   Problem: Coping: Goal: Level of anxiety will decrease Outcome: Progressing   Problem: Elimination: Goal: Will not experience complications related to bowel motility Outcome: Progressing Goal: Will not experience complications related to urinary retention Outcome: Progressing   Problem: Pain Managment: Goal: General experience of comfort will improve and/or be controlled Outcome: Progressing   Problem: Safety: Goal: Ability to remain free from injury will improve Outcome: Progressing   Problem: Skin Integrity: Goal: Risk for impaired skin integrity will decrease Outcome: Progressing

## 2024-09-08 NOTE — Progress Notes (Signed)
 PROGRESS NOTE    Christoffer Currier  FMW:[id number] DOB: 1961-05-18 DOA: 09/05/2024 PCP: [facility]   Brief Narrative:  63 y.o. male with medical history significant for diabetes mellitus type 2, HTN, hyperlipidemia, history of unspecified CVA, chronic pain on Dilaudid , s/p left BKA, esophageal dysmotility, depression/anxiety presented from SNF for evaluation of syncopal episode along with nausea and vomiting.  On presentation, initial BP was 98/60.  Creatinine 1.97 , Lipase 22, WBC 7.1.  CT head without contrast showed no acute intracranial abnormality.  CT cervical spine without contrast showed no acute cervical spine abnormality.  CT L-spine showed no acute fracture or listhesis of the lumbar spine.  CT of abdomen and pelvis without contrast showed no acute findings.  Thoracic spine x-ray was negative for fracture.  He was started on IV fluids and antiemetics.  Assessment & Plan:   Acute kidney injury -Possibly prerenal from GI losses.  Creatinine 1.97 compared to baseline of 1.1-1.4.  Imaging as above.  Renal ultrasound showed no hydronephrosis but showed exophytic right renal lesion and recommended nonemergent renal protocol MRI. - Creatinine improving to 1.02 today.  Decrease IV fluids to normal saline at 50 cc an hour.  Repeat a.m. labs - Lasix , losartan  and Farxiga  on hold  Syncope - Possibly orthostatic.  Imaging negative for acute injury.  Continue orthostatic vital monitoring.  Patient declined PT/OT eval.  IV fluid as above.  Antihypertensives on hold for now  Nausea and vomiting - Questionable cause.  Patient states that he had 3 episodes of vomiting earlier this morning and is not ready for discharge today.  Advance diet as tolerated.  Continue Zofran  .  around-the-clock for the next 24 hours.  Add Reglan  as needed.  Continue IV Protonix  and Maalox as needed.  LFTs and lipase are normal.  Add Carafate .  Hypertension -Monitor blood pressure.  Antihypertensive plan as  above  Diabetes mellitus type 2 - Home meds on hold.  Continue CBGs with SSI  Hyperlipidemia -Continue statin  Anxiety/depression - Continue duloxetine , sertraline  and trazodone   History of unspecified CVA - Continue statin and aspirin   Chronic pain syndrome with opiate dependence -Continue current pain management regimen.  Outpatient follow-up with pain management  BPH - Continue Flomax   Right kidney lesion -CT showed indeterminate 12 mm exophytic lesion in the lower pole of the right kidney, enlarged since prior exam. Nonemergent follow-up renal protocol MRI or CT with and without contrast recommended for further evaluation.  This can happen as an outpatient  Asymmetric thickening of the left base of tongue -Incidental finding on CT C-spine.  No obvious abnormality appreciated on visual inspection.  Consider nonemergent contrast-enhanced CT of the neck soft tissues as an outpatient for further characterization.   Obesity class I - Outpatient follow-up  DVT prophylaxis: Heparin  subcutaneous Code Status: Full Family Communication: None at bedside Disposition Plan: Status is: Observation The patient will require care spanning > 2 midnights and should be moved to inpatient because: Of severity of illness    Consultants: None  Procedures: None  Antimicrobials: None   Subjective: Patient seen and examined at bedside.  Had 3 episodes of vomiting earlier this morning and does not feel well enough to be discharged today.  Continues to have intermittent pain.  No fever, chest pain or worsening shortness of breath reported  objective: Vitals:   09/07/24 1340 09/07/24 2014 09/08/24 0457 09/08/24 0458  BP: (!) 142/85 (!) 147/75 (!) 165/92   Pulse: 86 89 93   Resp: 15  19 18   Temp: 98.2 F (36.8 C) 97.6 F (36.4 C) 98.4 F (36.9 C)   TempSrc:  Oral Oral   SpO2: 95% 93% 95%   Weight:    115 kg  Height:        Intake/Output Summary (Last 24 hours) at 09/08/2024  0920 Last data filed at 09/08/2024 0901 Gross per 24 hour  Intake 447.91 ml  Output 1000 ml  Net -552.09 ml   Filed Weights   09/05/24 2234 09/06/24 0500 09/08/24 0458  Weight: 115.2 kg 116.5 kg 115 kg    Examination:  General: No acute distress.  On room air currently. ENT/neck: No JVD elevation or palpable neck masses noted  respiratory: Bilateral decreased breath sounds at bases with scattered crackles CVS: Rate mostly controlled; S1 and S2 are heard  abdominal: Soft, obese, nontender, distended mildly; no organomegaly, bowel sounds are heard normally Extremities: Has left BKA CNS: Alert and oriented.  Remains slow to respond.  No focal neurologic deficit.  Able to move extremities Lymph: No obvious palpable lymphadenopathy Skin: No obvious petechiae/rashes psych: Showing no signs of agitation.  Flat affect mostly.   Data Reviewed: I have personally reviewed following labs and imaging studies  CBC: Recent Labs  Lab 09/05/24 1850 09/05/24 1857 09/06/24 0548 09/08/24 0557  WBC 7.1  --  5.7 5.1  NEUTROABS 5.3  --   --  3.3  HGB 11.7* 12.9* 11.0* 11.6*  HCT 38.4* 38.0* 36.8* 38.7*  MCV 80.3  --  81.8 81.3  PLT 275  --  233 241   Basic Metabolic Panel: Recent Labs  Lab 09/05/24 1850 09/05/24 1857 09/06/24 0548 09/07/24 0540 09/08/24 0557  NA 141 141 141 140 138  K 4.0 4.0 4.2 4.2 3.9  CL 103 103 105 105 102  CO2 27  --  29 28 25   GLUCOSE 100* 99 96 112* 107*  BUN 22 23 19 16 13   CREATININE 1.97* 2.20* 1.59* 1.09 1.02  CALCIUM  8.9  --  8.9 8.8* 8.9  MG  --   --   --  2.2 1.9   GFR: Estimated Creatinine Clearance: 102.5 mL/min (by C-G formula based on SCr of 1.02 mg/dL). Liver Function Tests: Recent Labs  Lab 09/05/24 1850 09/08/24 0557  AST 23 17  ALT 13 10  ALKPHOS 123 124  BILITOT 0.3 0.4  PROT 7.1 6.7  ALBUMIN  3.9 3.6   Recent Labs  Lab 09/05/24 1850 09/08/24 0557  LIPASE 22 15   No results for input(s): AMMONIA in the last 168  hours. Coagulation Profile: No results for input(s): INR, PROTIME in the last 168 hours. Cardiac Enzymes: No results for input(s): CKTOTAL, CKMB, CKMBINDEX, TROPONINI in the last 168 hours. BNP (last 3 results) No results for input(s): PROBNP in the last 8760 hours. HbA1C: No results for input(s): HGBA1C in the last 72 hours. CBG: Recent Labs  Lab 09/07/24 1205 09/07/24 1644 09/07/24 2014 09/08/24 0455 09/08/24 0722  GLUCAP 130* 123* 117* 98 129*   Lipid Profile: No results for input(s): CHOL, HDL, LDLCALC, TRIG, CHOLHDL, LDLDIRECT in the last 72 hours. Thyroid Function Tests: No results for input(s): TSH, T4TOTAL, FREET4, T3FREE, THYROIDAB in the last 72 hours. Anemia Panel: No results for input(s): VITAMINB12, FOLATE, FERRITIN, TIBC, IRON, RETICCTPCT in the last 72 hours. Sepsis Labs: No results for input(s): PROCALCITON, LATICACIDVEN in the last 168 hours.  No results found for this or any previous visit (from the past 240 hours).  Radiology Studies: No results found.       Scheduled Meds:  artificial tears  1 drop Left Eye QID   aspirin  EC  81 mg Oral Daily   atorvastatin   40 mg Oral QHS   brimonidine   1 drop Left Eye BID   DULoxetine   60 mg Oral Daily   heparin   5,000 Units Subcutaneous Q8H   insulin  aspart  0-5 Units Subcutaneous QHS   insulin  aspart  0-9 Units Subcutaneous TID WC   linaclotide   145 mcg Oral QAC breakfast   melatonin  10 mg Oral QHS   ondansetron  (ZOFRAN ) IV  4 mg Intravenous Q6H   pantoprazole  (PROTONIX ) IV  40 mg Intravenous Q12H   senna-docusate  1 tablet Oral BID   sertraline   50 mg Oral Daily   sodium chloride  flush  3 mL Intravenous Q12H   tamsulosin   0.4 mg Oral QHS   traZODone   100 mg Oral QHS   Continuous Infusions:  sodium chloride  75 mL/hr at 09/08/24 0152          Sophie Mao, MD [facility] 09/08/2024, 9:20 AM

## 2024-09-09 DIAGNOSIS — N179 Acute kidney failure, unspecified: Secondary | ICD-10-CM | POA: Diagnosis not present

## 2024-09-09 LAB — BASIC METABOLIC PANEL WITH GFR
Anion gap: 9 (ref 5–15)
BUN: 15 mg/dL (ref 8–23)
CO2: 28 mmol/L (ref 22–32)
Calcium: 8.7 mg/dL — ABNORMAL LOW (ref 8.9–10.3)
Chloride: 101 mmol/L (ref 98–111)
Creatinine, Ser: 0.99 mg/dL (ref 0.61–1.24)
GFR, Estimated: >60 mL/min (ref >60)
Glucose, Bld: 128 mg/dL — ABNORMAL HIGH (ref 70–99)
Potassium: 3.9 mmol/L (ref 3.5–5.1)
Sodium: 138 mmol/L (ref 135–145)

## 2024-09-09 LAB — GLUCOSE, CAPILLARY
Glucose-Capillary: 140 mg/dL — ABNORMAL HIGH (ref 70–99)
Glucose-Capillary: 146 mg/dL — ABNORMAL HIGH (ref 70–99)
Glucose-Capillary: 226 mg/dL — ABNORMAL HIGH (ref 70–99)

## 2024-09-09 LAB — MAGNESIUM: Magnesium: 2 mg/dL (ref 1.7–2.4)

## 2024-09-09 MED ORDER — ALUM & MAG HYDROXIDE-SIMETH 200-200-20 MG/5ML PO SUSP: 15.0000 mL | ORAL | 0 refills | Status: AC | PRN

## 2024-09-09 NOTE — Discharge Summary (Signed)
 Physician Discharge Summary  Brett White FMW:[id number] DOB: 1961-01-23 DOA: 09/05/2024  PCP: [facility]  Admit date: 09/05/2024 Discharge date: 09/09/2024  Admitted From: SNF Disposition: SNF  Recommendations for Outpatient Follow-up:  Follow up with SNF provider at earliest convenience Follow up in ED if symptoms worsen or new appear   Home Health: No Equipment/Devices: None  Discharge Condition: Stable CODE STATUS: Full Diet recommendation: Heart healthy/carb modified   Brief/Interim Summary: 63 y.o. male with medical history significant for diabetes mellitus type 2, HTN, hyperlipidemia, history of unspecified CVA, chronic pain on Dilaudid , s/p left BKA, esophageal dysmotility, depression/anxiety presented from SNF for evaluation of syncopal episode along with nausea and vomiting.  On presentation, initial BP was 98/60.  Creatinine 1.97 , Lipase 22, WBC 7.1.  CT head without contrast showed no acute intracranial abnormality.  CT cervical spine without contrast showed no acute cervical spine abnormality.  CT L-spine showed no acute fracture or listhesis of the lumbar spine.  CT of abdomen and pelvis without contrast showed no acute findings.  Thoracic spine x-ray was negative for fracture.  He was started on IV fluids and antiemetics.  During the hospital lesson, his condition has gradually improved.  He is currently tolerating diet and feels okay did be discharged back.  He will be discharged back to SNF today.  Discharge Diagnoses:   Acute kidney injury -Possibly prerenal from GI losses.  Creatinine 1.97 compared to baseline of 1.1-1.4.  Imaging as above.  Renal ultrasound showed no hydronephrosis but showed exophytic right renal lesion and recommended nonemergent renal protocol MRI. - Creatinine improving to 0.99 today.  DC IV fluids.  Outpatient follow-up of BMP. -Resume Lasix  and Farxiga .  Keep losartan  on hold till reevaluation by SNF provider.   - Discharge  patient back to SNF today.  Currently hemodynamically stable  Syncope - Possibly orthostatic.  Imaging negative for acute injury.   -Treated with IV fluids. -No more syncope since admission.  Antihypertensives as below.  Nausea and vomiting - Questionable cause.  Treated with around-the-clock Zofran  along with IV Protonix  and as needed Maalox.   - Improved.  Currently tolerating diet.  Continue PPI twice a day along with as needed Maalox on discharge.  Continue as needed antiemetics  Hypertension - Blood pressure is now on the higher side.  Resume Lasix  and amlodipine  on discharge.   Losartan  to remain on hold   Diabetes mellitus type 2 - Blood sugars intermittently on the lower side.  Continue sliding scale coverage.  Glipizide  to remain on hold till reevaluation by SNF provider   Hyperlipidemia -Continue statin   Anxiety/depression - Continue duloxetine , sertraline  and trazodone    History of unspecified CVA - Continue statin and aspirin    Chronic pain syndrome with opiate dependence -Continue current pain management regimen.  Outpatient follow-up with pain management -Continue bowel regimen   BPH - Continue Flomax    Right kidney lesion -CT showed indeterminate 12 mm exophytic lesion in the lower pole of the right kidney, enlarged since prior exam. Nonemergent follow-up renal protocol MRI or CT with and without contrast recommended for further evaluation.  This can happen as an outpatient   Asymmetric thickening of the left base of tongue -Incidental finding on CT C-spine.  No obvious abnormality appreciated on visual inspection.  Consider nonemergent contrast-enhanced CT of the neck soft tissues as an outpatient for further characterization.    Obesity class I - Outpatient follow-up  Discharge Instructions  Discharge Instructions     Diet  Carb Modified   Complete by: As directed       Allergies as of 09/09/2024   No Known Allergies      Medication List      STOP taking these medications    glipiZIDE  10 MG tablet Commonly known as: GLUCOTROL    losartan  100 MG tablet Commonly known as: COZAAR    promethazine  25 MG tablet Commonly known as: PHENERGAN    senna 8.6 MG Tabs tablet Commonly known as: SENOKOT       TAKE these medications    acetaminophen  325 MG tablet Commonly known as: TYLENOL  Take 2 tablets (650 mg total) by mouth every 6 (six) hours as needed for mild pain (pain score 1-3), fever or headache.   alum & mag hydroxide-simeth 200-200-20 MG/5ML suspension Commonly known as: MAALOX/MYLANTA Take 15 mLs by mouth every 4 (four) hours as needed for indigestion or heartburn.   amLODipine  10 MG tablet Commonly known as: NORVASC  Take 10 mg by mouth every morning.   artificial tears ophthalmic solution Place 1 drop into the left eye 4 (four) times daily.   aspirin  EC 81 MG tablet Take 1 tablet (81 mg total) by mouth daily. Swallow whole.   atorvastatin  40 MG tablet Commonly known as: LIPITOR Take 40 mg by mouth at bedtime.   Bisacodyl  Laxative 10 MG suppository Generic drug: bisacodyl  Place 10 mg rectally daily as needed for moderate constipation.   brimonidine  0.2 % ophthalmic solution Commonly known as: ALPHAGAN  Place 1 drop into the left eye in the morning and at bedtime.   carboxymethylcellulose 1 % ophthalmic solution Place 4 drops into the left eye in the morning, at noon, in the evening, and at bedtime.   DULoxetine  60 MG capsule Commonly known as: CYMBALTA  Take 60 mg by mouth in the morning.   ergocalciferol  1.25 MG (50000 UT) capsule Commonly known as: VITAMIN D2 Take 50,000 Units by mouth once a week. Fridays   famotidine  20 MG tablet Commonly known as: PEPCID  Take 20 mg by mouth in the morning.   Farxiga  10 MG Tabs tablet Generic drug: dapagliflozin  propanediol Take 10 mg by mouth every morning. Dapaglifozin   feeding supplement Liqd Take 237 mLs by mouth 2 (two) times daily between  meals. What changed:  when to take this reasons to take this   furosemide  20 MG tablet Commonly known as: LASIX  Take 20 mg by mouth every morning.   HumaLOG KwikPen 100 UNIT/ML KwikPen Generic drug: insulin  lispro Inject 2 Units into the skin 3 (three) times daily as needed (per sliding scale). Inject per sliding scale: If 201-250 = 2 units; if 251-300 = 4 units; if 301-350 = 6 units; if 351-400 = 8 units; for greater than 401, notify provider.   HYDROmorphone  4 MG tablet Commonly known as: DILAUDID  Take 4 mg by mouth every 6 (six) hours as needed for severe pain (pain score 7-10).   ipratropium-albuterol  0.5-2.5 (3) MG/3ML Soln Commonly known as: DUONEB Take 3 mLs by nebulization every 6 (six) hours as needed.   Linzess  145 MCG Caps capsule Generic drug: linaclotide  Take 145 mcg by mouth in the morning.   Melatonin 5 MG Caps Take 10 mg by mouth at bedtime.   metoprolol  tartrate 100 MG tablet Commonly known as: LOPRESSOR  Take one tablet by mouth 2 hours prior to CT Scan   MILK OF MAGNESIA PO Take 30 mLs by mouth as needed (for constipation).   OVER THE COUNTER MEDICATION Place 1 enema rectally daily as needed (constipation, if  no relief from suppository). **Sodium Phosphate  Enema**   pantoprazole  40 MG tablet Commonly known as: PROTONIX  Take 1 tablet (40 mg total) by mouth 2 (two) times daily before a meal.   polyethylene glycol 17 g packet Commonly known as: MIRALAX  / GLYCOLAX  Take 17 g by mouth daily.   pregabalin  100 MG capsule Commonly known as: LYRICA  Take 1 capsule (100 mg total) by mouth 2 (two) times daily.   senna-docusate 8.6-50 MG tablet Commonly known as: Senokot-S Take 1 tablet by mouth 2 (two) times daily.   sertraline  50 MG tablet Commonly known as: ZOLOFT  Take 50 mg by mouth in the morning.   tamsulosin  0.4 MG Caps capsule Commonly known as: FLOMAX  Take 0.4 mg by mouth at bedtime.   traZODone  100 MG tablet Commonly known as: DESYREL  Take  100 mg by mouth at bedtime.   Trulicity  1.5 MG/0.5ML Soaj Generic drug: Dulaglutide  Inject 0.5 mg into the skin once a week. On Thursdays        No Known Allergies  Consultations: None   Procedures/Studies: US  RENAL Result Date: 09/05/2024 EXAM: US  Retroperitoneum Complete, Renal. 09/05/2024 10:20:00 PM TECHNIQUE: Real-time ultrasonography of the retroperitoneum renal was performed. COMPARISON: CT abdomen and pelvis 09/25/2024 CLINICAL HISTORY: 409830 AKI (acute kidney injury) 409830 AKI (acute kidney injury) FINDINGS: FINDINGS: RIGHT KIDNEY/URETER: Right kidney measures 13.4 x 5.7 x 5.6 cm, volume 223 ml. Normal cortical echogenicity. No hydronephrosis. Calcified stone within the right kidney measuring up to 16 mm. Exophytic right renal lesion visualized on CT not well visualized on ultrasound. LEFT KIDNEY/URETER: Left kidney measures 13.1 x 6.1 x 4.2 cm, volume 175 ml. Normal cortical echogenicity. No hydronephrosis. No calculus. Simple 3.4 x 2.5 x 2.8 cm renal cyst in the left kidney. BLADDER: Unremarkable appearance of the bladder. IMPRESSION: 1. Nonobstructive calcified right renal stone measuring up to 16 mm. 2. Exophytic right renal lesion visualized on CT not well visualized on ultrasound. Recommend nonemergent renal protocol MRI. Electronically signed by: Morgane Naveau MD 09/05/2024 10:29 PM EST RP Workstation: [id number]   CT L-SPINE NO CHARGE Result Date: 09/05/2024 EXAM: CT OF THE LUMBAR SPINE WITHOUT CONTRAST 09/05/2024 07:41:55 PM TECHNIQUE: CT of the lumbar spine was performed without the administration of intravenous contrast. Multiplanar reformatted images are provided for review. Automated exposure control, iterative reconstruction, and/or weight based adjustment of the mA/kV was utilized to reduce the radiation dose to as low as reasonably achievable. COMPARISON: None available. CLINICAL HISTORY: Fall, low back twisting injury. FINDINGS: BONES AND ALIGNMENT: Normal vertebral  body heights. Anterior lumbar discectomy and fusion with interbody bone cages seen at L3-L5. Incorporation of interbody bone graft and bridging callus identified at L4-L5. Bridging callus identified involving the posterolateral aspects of the vertebral bodies bilaterally at L3-L4. Mild straightening of the operative segment. Left L3 hemilaminectomy. Left L4 hemilaminectomy. No acute fracture or listhesis of the lumbar spine. No suspicious bone lesion. DEGENERATIVE CHANGES: Disc space narrowing and endplate remodeling at L5-S1 are present, in keeping with changes of degenerative disc disease moderate degenerative disc disease. Moderate degenerative changes are seen throughout the remainder of the visualized thoracolumbar spine. T12-L1: Normal. L1-L2: Mild bilateral facet arthrosis with possible left synovial cyst resulting in mild left neural foraminal narrowing. The right neural foramen is widely patent. Broad-based disc bulge results in mild central canal stenosis within the subforaminal zone. L2-L3: Small bilateral facet arthrosis. Broad-based disc bulge. Moderate central canal stenosis within the subarticular zone. No significant neural foraminal narrowing. L3-L4: Anterior discectomy and fusion. No significant  neural foraminal narrowing. Left L3 hemilaminectomy. No significant canal stenosis. L4-L5: Anterior discectomy and fusion. Left L4 hemilaminectomy. No significant neural foraminal narrowing. No canal stenosis. L5-S1: Moderate right and mild-to-moderate left neural foraminal narrowing. A posterior disc osteophyte complex mildly effaces the lateral recesses bilaterally. No significant canal stenosis. Advanced bilateral facet arthrosis. SOFT TISSUES: 10 mm nonobstructing calculus within the visualized lower pole of the right kidney. Mild aortoiliac atherosclerotic calcification. No paraspinal inflammatory change or fluid collection identified. No acute abnormality. IMPRESSION: 1. No acute fracture or listhesis  of the lumbar spine. 2. Status post anterior lumbar discectomy and fusion with interbody bone cages at L3-L5, with incorporation of interbody bone graft and bridging callus at L4-5 and bridging callus at L3-4. Mild straightening of the operative segment. 3. Moderate degenerative disc disease at L5-S1 with moderate right and mild-to-moderate left neural foraminal narrowing. No significant canal stenosis. Electronically signed by: Dorethia Molt MD 09/05/2024 08:11 PM EST RP Workstation: [id number]   DG Thoracic Spine 2 View Result Date: 09/05/2024 CLINICAL DATA:  Clemens, pain EXAM: THORACIC SPINE 2 VIEWS COMPARISON:  None Available. FINDINGS: Frontal and lateral views of the thoracic spine are obtained. Alignment is anatomic. There are no acute displaced fractures. Mild spondylosis within the lower thoracic spine. The paraspinal soft tissues are unremarkable. IMPRESSION: 1. Lower thoracic spondylosis. 2. No acute fracture. Electronically Signed   By: Ozell Daring M.D.   On: 09/05/2024 20:11   CT ABDOMEN PELVIS WO CONTRAST Result Date: 09/05/2024 EXAM: CT ABDOMEN AND PELVIS WITHOUT CONTRAST 09/05/2024 07:41:55 PM TECHNIQUE: CT of the abdomen and pelvis was performed without the administration of intravenous contrast. Multiplanar reformatted images are provided for review. Automated exposure control, iterative reconstruction, and/or weight-based adjustment of the mA/kV was utilized to reduce the radiation dose to as low as reasonably achievable. COMPARISON: 04/17/2024. Also compared to renal sonogram of 04/17/2024 and prior examination of 06/29/2021. CLINICAL HISTORY: Abdominal pain, acute, nonlocalized. FINDINGS: LOWER CHEST: No acute abnormality. LIVER: The liver is unremarkable. GALLBLADDER AND BILE DUCTS: Gallbladder is unremarkable. No biliary ductal dilatation. SPLEEN: No acute abnormality. PANCREAS: No acute abnormality. ADRENAL GLANDS: No acute abnormality. KIDNEYS, URETERS AND BLADDER: Moderate bilateral  perinephric stranding is present, nonspecific, but stable since prior examination. A simple cortical cyst is seen within the interpolar region of the left kidney, for which no follow-up imaging is recommended. An indeterminate 12 mm exophytic lesion is seen arising from the lower pole of the right kidney, which was not visualized on the prior renal sonogram of 04/17/2024, and has enlarged slightly since her prior examination of 06/29/2021, with this measured 8 mm. Further evaluation with pre and post contrast MRI or CT should be considered. MRI is preferred in younger patients (due to lack of ionizing radiation) and for evaluating calcified lesion(s). Multiple nonobstructing renal calculi are seen within the right kidney measuring up to 10 cm. No hydronephrosis. No ureteral calculi. The bladder is unremarkable. GI AND BOWEL: Appendix absent. The stomach, small bowel, and large bowel are otherwise unremarkable. There is no bowel obstruction. PERITONEUM AND RETROPERITONEUM: No ascites. No free air. VASCULATURE: Mild aortoiliac atherosclerotic calcification. No aortic aneurysm. Aorta is normal in caliber. LYMPH NODES: No lymphadenopathy. REPRODUCTIVE ORGANS: No acute abnormality. BONES AND SOFT TISSUES: L3-L5 anterior lumbar fusion with instrumentation has been performed with solid incorporation of interbody bone graft and bridging callus identified. Osseous structures are otherwise age-appropriate. No acute bone abnormality. Tiny fat-containing umbilical hernia. IMPRESSION: 1. No acute findings. 2. Indeterminate 12 mm exophytic lesion in  the lower pole of the right kidney, enlarged since prior examination; recommend nonemergent renal protocol MRI (preferred) or CT without and with contrast for further characterization. MRI is preferred in younger patients and for evaluating calcified lesions. 3. Multiple nonobstructing right renal calculi. No hydronephrosis or ureteral calculi. Electronically signed by: Dorethia Molt  MD 09/05/2024 08:00 PM EST RP Workstation: [id number]   CT Cervical Spine Wo Contrast Result Date: 09/05/2024 EXAM: CT CERVICAL SPINE WITHOUT CONTRAST 09/05/2024 07:41:55 PM TECHNIQUE: CT of the cervical spine was performed without the administration of intravenous contrast. Multiplanar reformatted images are provided for review. Automated exposure control, iterative reconstruction, and/or weight based adjustment of the mA/kV was utilized to reduce the radiation dose to as low as reasonably achievable. COMPARISON: None available. CLINICAL HISTORY: Neck trauma, midline tenderness (Age 81-64y). FINDINGS: CERVICAL SPINE: BONES AND ALIGNMENT: Cervical alignment is normal. Mild reversal of the normal cervical lordosis. No listhesis. The atlantodental interval is not widened. No acute fracture or traumatic malalignment. DEGENERATIVE CHANGES: Disc space narrowing and endplate remodeling are seen at C3-C4 and C5-C7 in keeping with changes of advanced degenerative disc disease. Degenerative ankylosis of the vertebral bodies and facet joints of C6-C7. Bridging anterior disc osteophyte seen at C5-C7. Posterior disc osteophyte complex at C5-C6 results in moderate central canal stenosis with an AP diameter of the spinal canal of approximately 6-7 mm and flattening of the thecal sac. The spinal canal is otherwise widely patent. Multilevel uncovertebral and facet arthrosis results in moderate bilateral neural foraminal narrowing at C3-C4 and C6-C7, and mild bilateral neural foraminal narrowing at C5-C6. SOFT TISSUES: No prevertebral soft tissue swelling. There is asymmetric thickening of the left base of the tongue which is not well characterized on this examination; however, an infiltrative mass in this region is not excluded. Correlation with direct visualization and/or oral contrast enhanced CT examination of the neck soft tissues is recommended for further characterization. IMPRESSION: 1. No acute abnormality of the  cervical spine related to the reported neck trauma. 2. No acute findings. multilevel degenerative disc and degenerative joint disease with resultant mild-to-moderate multilevel neuroforaminal narrowing and moderate central canal stenosis at C5-6 as noted above. 3. Asymmetric thickening of the left base of the tongue, not well characterized on this examination. An infiltrative mass is not excluded. Recommend direct visualization and/or non-emergent contrast-enhanced CT of the neck soft tissues for further characterization. Electronically signed by: Dorethia Molt MD 09/05/2024 07:51 PM EST RP Workstation: [id number]   CT Head Wo Contrast Result Date: 09/05/2024 EXAM: CT HEAD WITHOUT CONTRAST 09/05/2024 07:41:55 PM TECHNIQUE: CT of the head was performed without the administration of intravenous contrast. Automated exposure control, iterative reconstruction, and/or weight based adjustment of the mA/kV was utilized to reduce the radiation dose to as low as reasonably achievable. COMPARISON: None available. CLINICAL HISTORY: Head trauma, moderate-severe. FINDINGS: BRAIN AND VENTRICLES: No acute hemorrhage. No evidence of acute infarct. Encephalomalacia involving the globus pallidus bilaterally may reflect a sequela of remote hypoxic / ischemic encephalopathy and/or carbon monoxide poisoning. Remote-appearing infarct within the right cerebellar hemisphere. No hydrocephalus. No extra-axial collection. No mass effect or midline shift. ORBITS: No acute abnormality. SINUSES: No acute abnormality. SOFT TISSUES AND SKULL: No acute soft tissue abnormality. No skull fracture. IMPRESSION: 1. No acute intracranial abnormality. 2. Encephalomalacia involving the globus pallidus bilaterally, possibly related to remote hypoxic/ischemic encephalopathy or carbon monoxide poisoning. 3. Remote infarct within the right cerebellar hemisphere. Electronically signed by: Dorethia Molt MD 09/05/2024 07:46 PM EST RP Workstation: [id number]  Subjective: Patient seen and examined at bedside.  No fever, vomiting, worsening abdominal pain reported.  Feels okay to go be discharged today.  Discharge Exam: Vitals:   09/08/24 2105 09/09/24 0451  BP: (!) 167/97 (!) 146/92  Pulse: 83 83  Resp: 17 18  Temp: 98.3 F (36.8 C) 97.9 F (36.6 C)  SpO2: 94% 91%    General: Pt is alert, awake, not in acute distress.  Looks chronically ill and deconditioned.  Slow to respond.  Poor historian.  Flat affect. Cardiovascular: rate controlled, S1/S2 + Respiratory: bilateral decreased breath sounds at bases Abdominal: Soft, obese, NT, ND, bowel sounds + Extremities: Left BKA present   The results of significant diagnostics from this hospitalization (including imaging, microbiology, ancillary and laboratory) are listed below for reference.     Microbiology: No results found for this or any previous visit (from the past 240 hours).   Labs: BNP (last 3 results) Recent Labs    04/15/24 0447 04/20/24 0920  BNP 163.4* 28.4   Basic Metabolic Panel: Recent Labs  Lab 09/05/24 1850 09/05/24 1857 09/06/24 0548 09/07/24 0540 09/08/24 0557 09/09/24 0528  NA 141 141 141 140 138 138  K 4.0 4.0 4.2 4.2 3.9 3.9  CL 103 103 105 105 102 101  CO2 27  --  29 28 25 28   GLUCOSE 100* 99 96 112* 107* 128*  BUN 22 23 19 16 13 15   CREATININE 1.97* 2.20* 1.59* 1.09 1.02 0.99  CALCIUM  8.9  --  8.9 8.8* 8.9 8.7*  MG  --   --   --  2.2 1.9 2.0   Liver Function Tests: Recent Labs  Lab 09/05/24 1850 09/08/24 0557  AST 23 17  ALT 13 10  ALKPHOS 123 124  BILITOT 0.3 0.4  PROT 7.1 6.7  ALBUMIN  3.9 3.6   Recent Labs  Lab 09/05/24 1850 09/08/24 0557  LIPASE 22 15   No results for input(s): AMMONIA in the last 168 hours. CBC: Recent Labs  Lab 09/05/24 1850 09/05/24 1857 09/06/24 0548 09/08/24 0557  WBC 7.1  --  5.7 5.1  NEUTROABS 5.3  --   --  3.3  HGB 11.7* 12.9* 11.0* 11.6*  HCT 38.4* 38.0* 36.8* 38.7*  MCV 80.3  --   81.8 81.3  PLT 275  --  233 241   Cardiac Enzymes: No results for input(s): CKTOTAL, CKMB, CKMBINDEX, TROPONINI in the last 168 hours. BNP: Invalid input(s): POCBNP CBG: Recent Labs  Lab 09/08/24 1159 09/08/24 1620 09/08/24 2102 09/09/24 0448 09/09/24 0747  GLUCAP 180* 165* 161* 140* 146*   D-Dimer No results for input(s): DDIMER in the last 72 hours. Hgb A1c No results for input(s): HGBA1C in the last 72 hours. Lipid Profile No results for input(s): CHOL, HDL, LDLCALC, TRIG, CHOLHDL, LDLDIRECT in the last 72 hours. Thyroid function studies No results for input(s): TSH, T4TOTAL, T3FREE, THYROIDAB in the last 72 hours.  Invalid input(s): FREET3 Anemia work up No results for input(s): VITAMINB12, FOLATE, FERRITIN, TIBC, IRON, RETICCTPCT in the last 72 hours. Urinalysis    Component Value Date/Time   COLORURINE STRAW (A) 09/07/2024 1017   APPEARANCEUR CLEAR 09/07/2024 1017   LABSPEC 1.013 09/07/2024 1017   PHURINE 5.0 09/07/2024 1017   GLUCOSEU >=500 (A) 09/07/2024 1017   HGBUR NEGATIVE 09/07/2024 1017   BILIRUBINUR NEGATIVE 09/07/2024 1017   KETONESUR NEGATIVE 09/07/2024 1017   PROTEINUR NEGATIVE 09/07/2024 1017   NITRITE NEGATIVE 09/07/2024 1017   LEUKOCYTESUR NEGATIVE 09/07/2024 1017   Sepsis  Labs Recent Labs  Lab 09/05/24 1850 09/06/24 0548 09/08/24 0557  WBC 7.1 5.7 5.1   Microbiology No results found for this or any previous visit (from the past 240 hours).   Time coordinating discharge: 35 minutes  SIGNED:   Sophie Mao, MD  [facility] 09/09/2024, 9:26 AM

## 2024-09-09 NOTE — TOC Transition Note (Signed)
 Transition of Care [facility]) - Discharge Note   Patient Details  Name: Brett White MRN: [id number] Date of Birth: Jun 09, 1961  Transition of Care [facility]) CM/SW Contact:  Heather DELENA Saltness, LCSW Phone Number: 09/09/2024, 10:02 AM   Clinical Narrative:    Pt discharging home to [facility] today. Pt admitting to room 206B. D/C packet placed in pt's chart at RN station. RN to call report to [phone]. D/C summary and FL2 faxed to [facility]. PTAR called at 10:49 AM. Pt made aware and in agreement with discharge plan. No further TOC needs at this time.   Final next level of care: Long Term Nursing Home Barriers to Discharge: Barriers Resolved   Patient Goals and CMS Choice Patient states their goals for this hospitalization and ongoing recovery are:: To return to [facility].gov Compare Post Acute Care list provided to:: Patient Choice offered to / list presented to : Patient [place] ownership interest in [facility].provided to:: Patient    Discharge Placement          Patient chooses bed at: [facility] Rehab Patient to be transferred to facility by: PTAR Name of family member notified: Patient Patient and family notified of of transfer: 09/09/24  Discharge Plan and Services Additional resources added to the After Visit Summary for  Follow Up In-house Referral: NA Discharge Planning Services: CM Consult            DME Arranged: N/A DME Agency: NA       HH Arranged: NA HH Agency: NA        Social Drivers of Health (SDOH) Interventions SDOH Screenings   Food Insecurity: No Food Insecurity (09/05/2024)  Housing: Low Risk  (09/05/2024)  Transportation Needs: No Transportation Needs (09/05/2024)  Utilities: Not At Risk (09/05/2024)  Tobacco Use: Low Risk  (09/05/2024)     Readmission Risk Interventions    04/21/2024    2:05 PM 04/13/2024    3:04 PM 11/16/2022    2:57 PM  Readmission Risk Prevention Plan  Transportation Screening  Complete Complete Complete  Medication Review Oceanographer) Complete Complete Complete  PCP or Specialist appointment within 3-5 days of discharge Complete Complete Complete  HRI or Home Care Consult Complete Complete Complete  SW Recovery Care/Counseling Consult Complete Complete Complete  Palliative Care Screening Not Applicable Complete Complete  Skilled Nursing Facility Complete Complete Complete    Signed: Heather Saltness, MSW, LCSW Clinical Social Worker Inpatient Care Management 09/09/2024 11:01 AM

## 2024-09-09 NOTE — Plan of Care (Signed)
  Problem: Education: Goal: Ability to describe self-care measures that may prevent or decrease complications (Diabetes Survival Skills Education) will improve Outcome: Progressing Goal: Individualized Educational Video(s) Outcome: Progressing   Problem: Coping: Goal: Ability to adjust to condition or change in health will improve Outcome: Progressing   Problem: Fluid Volume: Goal: Ability to maintain a balanced intake and output will improve Outcome: Progressing   Problem: Health Behavior/Discharge Planning: Goal: Ability to identify and utilize available resources and services will improve Outcome: Progressing Goal: Ability to manage health-related needs will improve Outcome: Progressing   Problem: Metabolic: Goal: Ability to maintain appropriate glucose levels will improve Outcome: Progressing   Problem: Nutritional: Goal: Maintenance of adequate nutrition will improve Outcome: Progressing Goal: Progress toward achieving an optimal weight will improve Outcome: Progressing   Problem: Skin Integrity: Goal: Risk for impaired skin integrity will decrease Outcome: Progressing   Problem: Tissue Perfusion: Goal: Adequacy of tissue perfusion will improve Outcome: Progressing   Problem: Education: Goal: Knowledge of condition and prescribed therapy will improve Outcome: Progressing   Problem: Cardiac: Goal: Will achieve and/or maintain adequate cardiac output Outcome: Progressing   Problem: Physical Regulation: Goal: Complications related to the disease process, condition or treatment will be avoided or minimized Outcome: Progressing   Problem: Education: Goal: Knowledge of General Education information will improve Description: Including pain rating scale, medication(s)/side effects and non-pharmacologic comfort measures Outcome: Progressing   Problem: Health Behavior/Discharge Planning: Goal: Ability to manage health-related needs will improve Outcome:  Progressing   Problem: Clinical Measurements: Goal: Ability to maintain clinical measurements within normal limits will improve Outcome: Progressing Goal: Will remain free from infection Outcome: Progressing Goal: Diagnostic test results will improve Outcome: Progressing Goal: Respiratory complications will improve Outcome: Progressing Goal: Cardiovascular complication will be avoided Outcome: Progressing   Problem: Activity: Goal: Risk for activity intolerance will decrease Outcome: Progressing   Problem: Nutrition: Goal: Adequate nutrition will be maintained Outcome: Progressing   Problem: Coping: Goal: Level of anxiety will decrease Outcome: Progressing   Problem: Elimination: Goal: Will not experience complications related to bowel motility Outcome: Progressing Goal: Will not experience complications related to urinary retention Outcome: Progressing   Problem: Pain Managment: Goal: General experience of comfort will improve and/or be controlled Outcome: Progressing   Problem: Safety: Goal: Ability to remain free from injury will improve Outcome: Progressing   Problem: Skin Integrity: Goal: Risk for impaired skin integrity will decrease Outcome: Progressing

## 2024-09-09 NOTE — NC FL2 (Signed)
 [place]  MEDICAID [id number] LEVEL OF CARE FORM     IDENTIFICATION  Patient Name: Daryll Spisak Birthdate: 03-11-1961 Sex: male Admission Date (Current Location): 09/05/2024  [facility] and [place] Number:  Producer, Television/film/video and Address:  [facility],  [address] [place] [address]      Provider Number: [id number]  Attending Physician Name and Address:  Cheryle Page, MD  Relative Name and Phone Number:       Current Level of Care: SNF Recommended Level of Care: Skilled Nursing Facility Prior Approval Number:    Date Approved/Denied:   PASRR Number:    Discharge Plan: SNF    Current Diagnoses: Patient Active Problem List   Diagnosis Date Noted   AKI (acute kidney injury) 09/05/2024   Syncope 09/05/2024   History of CVA (cerebrovascular accident) 09/05/2024   Lesion of right native kidney 09/05/2024   Acute renal failure superimposed on stage 2 chronic kidney disease 04/11/2024   SIRS (systemic inflammatory response syndrome) (HCC) 04/11/2024   Generalized anxiety disorder 03/20/2024   Chronic pain syndrome 03/20/2024   History of abdominal aortic aneurysm (AAA) 03/20/2024   Hyperlipidemia associated with type 2 diabetes mellitus (HCC) 03/20/2024   Narcotic dependence (HCC) 03/20/2024   Peripheral neuropathy 03/20/2024   Reactive airway disease 03/20/2024   Insomnia 03/20/2024   Unspecified open wound, right foot, initial encounter 05/14/2023   Acute respiratory failure with hypoxia (HCC) 11/16/2022   GERD without esophagitis 06/11/2022   Mixed diabetic hyperlipidemia associated with type 2 diabetes mellitus (HCC) 06/11/2022   Adjustment disorder with depressed mood 05/23/2022   Chronic hyponatremia 12/09/2021   Uncontrolled type 2 diabetes mellitus with hyperglycemia, without long-term current use of insulin  (HCC) 12/09/2021   S/P BKA (below knee amputation) unilateral, left (HCC) 04/05/2021   Insulin  dependent type 2 diabetes mellitus (HCC)  02/03/2021   Hypertension associated with diabetes (HCC) 02/03/2021   Diabetic polyneuropathy associated with type 2 diabetes mellitus (HCC) 02/03/2021    Orientation RESPIRATION BLADDER Height & Weight     Self, Time, Place  Normal Continent Weight: 254 lb 6.6 oz (115.4 kg) Height:  6' 2 (188 cm)  BEHAVIORAL SYMPTOMS/MOOD NEUROLOGICAL BOWEL NUTRITION STATUS      Continent Diet  AMBULATORY STATUS COMMUNICATION OF NEEDS Skin   Limited Assist Verbally Normal                       Personal Care Assistance Level of Assistance  Bathing, Feeding, Dressing Bathing Assistance: Limited assistance Feeding assistance: Independent Dressing Assistance: Limited assistance     Functional Limitations Info  Sight, Hearing, Speech Sight Info: Impaired Hearing Info: Impaired Speech Info: Adequate    SPECIAL CARE FACTORS FREQUENCY                       Contractures Contractures Info: Not present    Additional Factors Info  Code Status, Psychotropic, Allergies, Insulin  Sliding Scale Code Status Info: FULL Allergies Info: NKDA Psychotropic Info: DULoxetine  (CYMBALTA ) DR capsule 60 mg every morning,pregabalin  (LYRICA ) capsule 100 mg 2 times daily,sertraline  (ZOLOFT ) tablet 50 mg every morning,traZODone  (DESYREL ) tablet 75 mg daily at bedtime Insulin  Sliding Scale Info: nsulin lispro (HUMALOG KWIKPEN) 100 UNIT/ML KwikPen Inject 2 Units into the skin 3 (three) times daily as needed (per sliding scale). Inject per sliding scale: If 201-250 = 2 units; if 251-300 = 4 units; if 301-350 = 6 units; if 351-400 = 8 units; for greater than 401, notify provider.insulin  aspart (  novoLOG ) injection 0-5 Units 0-5 Units, Subcutaneous, Daily at bedtime, First dose on Sat 09/05/24 at 2200  Correction coverage: HS scale  CBG < 70: Implement Hypoglycemia Standing Orders and refer to Hypoglycemia Standing Orders sidebar report  CBG 70 - 120: 0 units  CBG 121 - 150: 0 units  CBG 151 - 200: 0 units  CBG 201 -  250: 2 units  CBG 251 - 300: 3 units  CBG 301 - 350: 4 units  CBG 351 - 400: 5 units       Current Medications (09/09/2024):  This is the current hospital active medication list Current Facility-Administered Medications  Medication Dose Route Frequency Provider Last Rate Last Admin   acetaminophen  (TYLENOL ) tablet 650 mg  650 mg Oral Q6H PRN Patel, Vishal R, MD       Or   acetaminophen  (TYLENOL ) suppository 650 mg  650 mg Rectal Q6H PRN Patel, Vishal R, MD       alum & mag hydroxide-simeth (MAALOX/MYLANTA) 200-200-20 MG/5ML suspension 15 mL  15 mL Oral Q4H PRN Cheryle, Kshitiz, MD       artificial tears ophthalmic solution 1 drop  1 drop Left Eye QID Tobie Jorie SAUNDERS, MD   1 drop at 09/09/24 0936   aspirin  EC tablet 81 mg  81 mg Oral Daily Patel, Vishal R, MD   81 mg at 09/09/24 9062   atorvastatin  (LIPITOR) tablet 40 mg  40 mg Oral QHS Patel, Vishal R, MD   40 mg at 09/08/24 2122   bisacodyl  (DULCOLAX) EC tablet 5 mg  5 mg Oral Daily PRN Patel, Vishal R, MD       brimonidine  (ALPHAGAN ) 0.2 % ophthalmic solution 1 drop  1 drop Left Eye BID Tobie Jorie R, MD   1 drop at 09/09/24 0936   DULoxetine  (CYMBALTA ) DR capsule 60 mg  60 mg Oral Daily Patel, Vishal R, MD   60 mg at 09/09/24 0936   heparin  injection 5,000 Units  5,000 Units Subcutaneous Q8H Patel, Vishal R, MD   5,000 Units at 09/09/24 0531   HYDROmorphone  (DILAUDID ) tablet 4 mg  4 mg Oral Q6H PRN Patel, Vishal R, MD   4 mg at 09/09/24 0935   insulin  aspart (novoLOG ) injection 0-5 Units  0-5 Units Subcutaneous QHS Patel, Vishal R, MD       insulin  aspart (novoLOG ) injection 0-9 Units  0-9 Units Subcutaneous TID WC Patel, Vishal R, MD   1 Units at 09/09/24 0936   ipratropium-albuterol  (DUONEB) 0.5-2.5 (3) MG/3ML nebulizer solution 3 mL  3 mL Nebulization Q6H PRN Patel, Vishal R, MD       linaclotide  (LINZESS ) capsule 145 mcg  145 mcg Oral QAC breakfast Patel, Vishal R, MD   145 mcg at 09/09/24 9062   melatonin tablet 10 mg  10 mg Oral QHS  Patel, Vishal R, MD   10 mg at 09/08/24 2122   metoCLOPramide  (REGLAN ) injection 10 mg  10 mg Intravenous Q6H PRN Cheryle Page, MD   10 mg at 09/08/24 9164   ondansetron  (ZOFRAN ) injection 4 mg  4 mg Intravenous Q6H Alekh, Kshitiz, MD   4 mg at 09/09/24 0531   pantoprazole  (PROTONIX ) injection 40 mg  40 mg Intravenous Q12H Alekh, Kshitiz, MD   40 mg at 09/09/24 0930   polyethylene glycol (MIRALAX  / GLYCOLAX ) packet 17 g  17 g Oral Daily PRN Patel, Vishal R, MD       senna-docusate (Senokot-S) tablet 1 tablet  1 tablet Oral BID  Tobie Jorie SAUNDERS, MD   1 tablet at 09/07/24 2117   sertraline  (ZOLOFT ) tablet 50 mg  50 mg Oral Daily Patel, Vishal R, MD   50 mg at 09/09/24 9063   sodium chloride  flush (NS) 0.9 % injection 3 mL  3 mL Intravenous Q12H Patel, Vishal R, MD   3 mL at 09/09/24 0943   sucralfate  (CARAFATE ) 1 GM/10ML suspension 1 g  1 g Oral TID WC & HS Alekh, Kshitiz, MD   1 g at 09/09/24 9062   tamsulosin  (FLOMAX ) capsule 0.4 mg  0.4 mg Oral QHS Patel, Vishal R, MD   0.4 mg at 09/08/24 2122   traZODone  (DESYREL ) tablet 100 mg  100 mg Oral QHS Patel, Vishal R, MD   100 mg at 09/08/24 2122     Discharge Medications: acetaminophen  325 MG tablet Commonly known as: TYLENOL  Take 2 tablets (650 mg total) by mouth every 6 (six) hours as needed for mild pain (pain score 1-3), fever or headache.    alum & mag hydroxide-simeth 200-200-20 MG/5ML suspension Commonly known as: MAALOX/MYLANTA Take 15 mLs by mouth every 4 (four) hours as needed for indigestion or heartburn.    amLODipine  10 MG tablet Commonly known as: NORVASC  Take 10 mg by mouth every morning.    artificial tears ophthalmic solution Place 1 drop into the left eye 4 (four) times daily.    aspirin  EC 81 MG tablet Take 1 tablet (81 mg total) by mouth daily. Swallow whole.    atorvastatin  40 MG tablet Commonly known as: LIPITOR Take 40 mg by mouth at bedtime.    Bisacodyl  Laxative 10 MG suppository Generic drug:  bisacodyl  Place 10 mg rectally daily as needed for moderate constipation.    brimonidine  0.2 % ophthalmic solution Commonly known as: ALPHAGAN  Place 1 drop into the left eye in the morning and at bedtime.    carboxymethylcellulose 1 % ophthalmic solution Place 4 drops into the left eye in the morning, at noon, in the evening, and at bedtime.    DULoxetine  60 MG capsule Commonly known as: CYMBALTA  Take 60 mg by mouth in the morning.    ergocalciferol  1.25 MG (50000 UT) capsule Commonly known as: VITAMIN D2 Take 50,000 Units by mouth once a week. Fridays    famotidine  20 MG tablet Commonly known as: PEPCID  Take 20 mg by mouth in the morning.    Farxiga  10 MG Tabs tablet Generic drug: dapagliflozin  propanediol Take 10 mg by mouth every morning. Dapaglifozin    feeding supplement Liqd Take 237 mLs by mouth 2 (two) times daily between meals. What changed:  when to take this reasons to take this    furosemide  20 MG tablet Commonly known as: LASIX  Take 20 mg by mouth every morning.    HumaLOG KwikPen 100 UNIT/ML KwikPen Generic drug: insulin  lispro Inject 2 Units into the skin 3 (three) times daily as needed (per sliding scale). Inject per sliding scale: If 201-250 = 2 units; if 251-300 = 4 units; if 301-350 = 6 units; if 351-400 = 8 units; for greater than 401, notify provider.    HYDROmorphone  4 MG tablet Commonly known as: DILAUDID  Take 4 mg by mouth every 6 (six) hours as needed for severe pain (pain score 7-10).    ipratropium-albuterol  0.5-2.5 (3) MG/3ML Soln Commonly known as: DUONEB Take 3 mLs by nebulization every 6 (six) hours as needed.    Linzess  145 MCG Caps capsule Generic drug: linaclotide  Take 145 mcg by mouth in the morning.  Melatonin 5 MG Caps Take 10 mg by mouth at bedtime.    metoprolol  tartrate 100 MG tablet Commonly known as: LOPRESSOR  Take one tablet by mouth 2 hours prior to CT Scan    MILK OF MAGNESIA PO Take 30 mLs by mouth as needed  (for constipation).    OVER THE COUNTER MEDICATION Place 1 enema rectally daily as needed (constipation, if no relief from suppository). **Sodium Phosphate  Enema**    pantoprazole  40 MG tablet Commonly known as: PROTONIX  Take 1 tablet (40 mg total) by mouth 2 (two) times daily before a meal.    polyethylene glycol 17 g packet Commonly known as: MIRALAX  / GLYCOLAX  Take 17 g by mouth daily.    pregabalin  100 MG capsule Commonly known as: LYRICA  Take 1 capsule (100 mg total) by mouth 2 (two) times daily.    senna-docusate 8.6-50 MG tablet Commonly known as: Senokot-S Take 1 tablet by mouth 2 (two) times daily.    sertraline  50 MG tablet Commonly known as: ZOLOFT  Take 50 mg by mouth in the morning.    tamsulosin  0.4 MG Caps capsule Commonly known as: FLOMAX  Take 0.4 mg by mouth at bedtime.    traZODone  100 MG tablet Commonly known as: DESYREL  Take 100 mg by mouth at bedtime.    Trulicity  1.5 MG/0.5ML Soaj Generic drug: Dulaglutide  Inject 0.5 mg into the skin once a week. On Thursdays      Relevant Imaging Results:  Relevant Lab Results:   Additional Information SSN [id number]  Heather DELENA Saltness, LCSW

## 2024-09-09 NOTE — Plan of Care (Signed)
 Problem: Education: Goal: Ability to describe self-care measures that may prevent or decrease complications (Diabetes Survival Skills Education) will improve 09/09/2024 1133 by Hogan-Nutting, Burnard HERO, RN Outcome: Adequate for Discharge 09/09/2024 0949 by Reesa Burnard HERO, RN Outcome: Progressing Goal: Individualized Educational Video(s) 09/09/2024 1133 by Reesa Burnard HERO, RN Outcome: Adequate for Discharge 09/09/2024 0949 by Reesa Burnard HERO, RN Outcome: Progressing   Problem: Coping: Goal: Ability to adjust to condition or change in health will improve 09/09/2024 1133 by Reesa Burnard HERO, RN Outcome: Adequate for Discharge 09/09/2024 0949 by Reesa Burnard HERO, RN Outcome: Progressing   Problem: Fluid Volume: Goal: Ability to maintain a balanced intake and output will improve 09/09/2024 1133 by Reesa Burnard HERO, RN Outcome: Adequate for Discharge 09/09/2024 0949 by Reesa Burnard HERO, RN Outcome: Progressing   Problem: Health Behavior/Discharge Planning: Goal: Ability to identify and utilize available resources and services will improve 09/09/2024 1133 by Hogan-Nutting, Burnard HERO, RN Outcome: Adequate for Discharge 09/09/2024 0949 by Reesa Burnard HERO, RN Outcome: Progressing Goal: Ability to manage health-related needs will improve 09/09/2024 1133 by Hogan-Nutting, Burnard HERO, RN Outcome: Adequate for Discharge 09/09/2024 0949 by Reesa Burnard HERO, RN Outcome: Progressing   Problem: Metabolic: Goal: Ability to maintain appropriate glucose levels will improve 09/09/2024 1133 by Reesa Burnard HERO, RN Outcome: Adequate for Discharge 09/09/2024 0949 by Reesa Burnard HERO, RN Outcome: Progressing   Problem: Nutritional: Goal: Maintenance of adequate nutrition will improve 09/09/2024 1133 by Reesa Burnard HERO, RN Outcome: Adequate for Discharge 09/09/2024 0949 by Reesa Burnard HERO, RN Outcome:  Progressing Goal: Progress toward achieving an optimal weight will improve 09/09/2024 1133 by Hogan-Nutting, Burnard HERO, RN Outcome: Adequate for Discharge 09/09/2024 0949 by Reesa Burnard HERO, RN Outcome: Progressing   Problem: Skin Integrity: Goal: Risk for impaired skin integrity will decrease 09/09/2024 1133 by Reesa Burnard HERO, RN Outcome: Adequate for Discharge 09/09/2024 0949 by Reesa Burnard HERO, RN Outcome: Progressing   Problem: Tissue Perfusion: Goal: Adequacy of tissue perfusion will improve 09/09/2024 1133 by Reesa Burnard HERO, RN Outcome: Adequate for Discharge 09/09/2024 0949 by Reesa Burnard HERO, RN Outcome: Progressing   Problem: Education: Goal: Knowledge of condition and prescribed therapy will improve 09/09/2024 1133 by Reesa Burnard HERO, RN Outcome: Adequate for Discharge 09/09/2024 0949 by Reesa Burnard HERO, RN Outcome: Progressing   Problem: Cardiac: Goal: Will achieve and/or maintain adequate cardiac output 09/09/2024 1133 by Reesa Burnard HERO, RN Outcome: Adequate for Discharge 09/09/2024 0949 by Reesa Burnard HERO, RN Outcome: Progressing   Problem: Physical Regulation: Goal: Complications related to the disease process, condition or treatment will be avoided or minimized 09/09/2024 1133 by Reesa Burnard HERO, RN Outcome: Adequate for Discharge 09/09/2024 0949 by Reesa Burnard HERO, RN Outcome: Progressing   Problem: Education: Goal: Knowledge of General Education information will improve Description: Including pain rating scale, medication(s)/side effects and non-pharmacologic comfort measures 09/09/2024 1133 by Reesa Burnard HERO, RN Outcome: Adequate for Discharge 09/09/2024 0949 by Reesa Burnard HERO, RN Outcome: Progressing   Problem: Health Behavior/Discharge Planning: Goal: Ability to manage health-related needs will improve 09/09/2024 1133 by Reesa Burnard HERO,  RN Outcome: Adequate for Discharge 09/09/2024 0949 by Reesa Burnard HERO, RN Outcome: Progressing   Problem: Clinical Measurements: Goal: Ability to maintain clinical measurements within normal limits will improve 09/09/2024 1133 by Reesa Burnard HERO, RN Outcome: Adequate for Discharge 09/09/2024 0949 by Reesa Burnard HERO, RN Outcome: Progressing Goal: Will remain free from infection 09/09/2024 1133 by Reesa Burnard HERO, RN Outcome: Adequate for Discharge 09/09/2024 [phone] by  Hogan-Nutting, Burnard HERO, RN Outcome: Progressing Goal: Diagnostic test results will improve 09/09/2024 1133 by Hogan-Nutting, Burnard HERO, RN Outcome: Adequate for Discharge 09/09/2024 0949 by Reesa Burnard HERO, RN Outcome: Progressing Goal: Respiratory complications will improve 09/09/2024 1133 by Hogan-Nutting, Burnard HERO, RN Outcome: Adequate for Discharge 09/09/2024 0949 by Reesa Burnard HERO, RN Outcome: Progressing Goal: Cardiovascular complication will be avoided 09/09/2024 1133 by Hogan-Nutting, Burnard HERO, RN Outcome: Adequate for Discharge 09/09/2024 0949 by Reesa Burnard HERO, RN Outcome: Progressing   Problem: Activity: Goal: Risk for activity intolerance will decrease 09/09/2024 1133 by Hogan-Nutting, Burnard HERO, RN Outcome: Adequate for Discharge 09/09/2024 0949 by Reesa Burnard HERO, RN Outcome: Progressing   Problem: Nutrition: Goal: Adequate nutrition will be maintained 09/09/2024 1133 by Reesa Burnard HERO, RN Outcome: Adequate for Discharge 09/09/2024 0949 by Reesa Burnard HERO, RN Outcome: Progressing   Problem: Coping: Goal: Level of anxiety will decrease 09/09/2024 1133 by Reesa Burnard HERO, RN Outcome: Adequate for Discharge 09/09/2024 0949 by Reesa Burnard HERO, RN Outcome: Progressing   Problem: Elimination: Goal: Will not experience complications related to bowel motility 09/09/2024 1133 by Reesa Burnard HERO, RN Outcome:  Adequate for Discharge 09/09/2024 0949 by Reesa Burnard HERO, RN Outcome: Progressing Goal: Will not experience complications related to urinary retention 09/09/2024 1133 by Reesa Burnard HERO, RN Outcome: Adequate for Discharge 09/09/2024 0949 by Reesa Burnard HERO, RN Outcome: Progressing   Problem: Pain Managment: Goal: General experience of comfort will improve and/or be controlled 09/09/2024 1133 by Reesa Burnard HERO, RN Outcome: Adequate for Discharge 09/09/2024 0949 by Reesa Burnard HERO, RN Outcome: Progressing   Problem: Safety: Goal: Ability to remain free from injury will improve 09/09/2024 1133 by Reesa Burnard HERO, RN Outcome: Adequate for Discharge 09/09/2024 0949 by Reesa Burnard HERO, RN Outcome: Progressing   Problem: Skin Integrity: Goal: Risk for impaired skin integrity will decrease 09/09/2024 1133 by Reesa Burnard HERO, RN Outcome: Adequate for Discharge 09/09/2024 0949 by Reesa Burnard HERO, RN Outcome: Progressing

## 2024-11-04 ENCOUNTER — Observation Stay (HOSPITAL_COMMUNITY)
Admission: EM | Admit: 2024-11-04 | Discharge: 2024-11-06 | Disposition: A | Discharge status: Skilled Nursing Facility | Source: Home / Self Care

## 2024-11-04 ENCOUNTER — Other Ambulatory Visit: Payer: Self-pay

## 2024-11-04 ENCOUNTER — Emergency Department (HOSPITAL_COMMUNITY)

## 2024-11-04 ENCOUNTER — Encounter (HOSPITAL_COMMUNITY): Payer: Self-pay | Admitting: Pharmacy Technician

## 2024-11-04 DIAGNOSIS — N179 Acute kidney failure, unspecified: Principal | ICD-10-CM

## 2024-11-04 DIAGNOSIS — R55 Syncope and collapse: Secondary | ICD-10-CM | POA: Diagnosis present

## 2024-11-04 LAB — COMPREHENSIVE METABOLIC PANEL WITH GFR
ALT: 33 U/L (ref 0–44)
AST: 44 U/L — ABNORMAL HIGH (ref 15–41)
Albumin: 4.4 g/dL (ref 3.5–5.0)
Alkaline Phosphatase: 146 U/L — ABNORMAL HIGH (ref 38–126)
Anion gap: 17 — ABNORMAL HIGH (ref 5–15)
BUN: 26 mg/dL — ABNORMAL HIGH (ref 8–23)
CO2: 23 mmol/L (ref 22–32)
Calcium: 9.6 mg/dL (ref 8.9–10.3)
Chloride: 96 mmol/L — ABNORMAL LOW (ref 98–111)
Creatinine, Ser: 1.94 mg/dL — ABNORMAL HIGH (ref 0.61–1.24)
GFR, Estimated: 39 mL/min — ABNORMAL LOW
Glucose, Bld: 169 mg/dL — ABNORMAL HIGH (ref 70–99)
Potassium: 4.3 mmol/L (ref 3.5–5.1)
Sodium: 136 mmol/L (ref 135–145)
Total Bilirubin: 0.5 mg/dL (ref 0.0–1.2)
Total Protein: 7.9 g/dL (ref 6.5–8.1)

## 2024-11-04 LAB — CBC WITH DIFFERENTIAL/PLATELET
Abs Immature Granulocytes: 0.03 10*3/uL (ref 0.00–0.07)
Basophils Absolute: 0.1 10*3/uL (ref 0.0–0.1)
Basophils Relative: 1 %
Eosinophils Absolute: 0 10*3/uL (ref 0.0–0.5)
Eosinophils Relative: 0 %
HCT: 42.3 % (ref 39.0–52.0)
Hemoglobin: 13.2 g/dL (ref 13.0–17.0)
Immature Granulocytes: 0 %
Lymphocytes Relative: 10 %
Lymphs Abs: 1 10*3/uL (ref 0.7–4.0)
MCH: 24.7 pg — ABNORMAL LOW (ref 26.0–34.0)
MCHC: 31.2 g/dL (ref 30.0–36.0)
MCV: 79.2 fL — ABNORMAL LOW (ref 80.0–100.0)
Monocytes Absolute: 0.6 10*3/uL (ref 0.1–1.0)
Monocytes Relative: 6 %
Neutro Abs: 8.4 10*3/uL — ABNORMAL HIGH (ref 1.7–7.7)
Neutrophils Relative %: 83 %
Platelets: 278 10*3/uL (ref 150–400)
RBC: 5.34 MIL/uL (ref 4.22–5.81)
RDW: 17.1 % — ABNORMAL HIGH (ref 11.5–15.5)
WBC: 10.1 10*3/uL (ref 4.0–10.5)
nRBC: 0 % (ref 0.0–0.2)

## 2024-11-04 LAB — TROPONIN T, HIGH SENSITIVITY
Troponin T High Sensitivity: 31 ng/L — ABNORMAL HIGH (ref 0–19)
Troponin T High Sensitivity: 23 ng/L — ABNORMAL HIGH (ref 0–19)

## 2024-11-04 LAB — I-STAT CG4 LACTIC ACID, ED
Lactic Acid, Venous: 3.3 mmol/L (ref 0.5–1.9)
Lactic Acid, Venous: 1.4 mmol/L (ref 0.5–1.9)

## 2024-11-04 LAB — MAGNESIUM: Magnesium: 2 mg/dL (ref 1.7–2.4)

## 2024-11-04 LAB — TSH: TSH: 0.901 u[IU]/mL (ref 0.350–4.500)

## 2024-11-04 MED ORDER — HYDROMORPHONE HCL 2 MG PO TABS
4.0000 mg | ORAL_TABLET | Freq: Once | ORAL | Status: AC
  Administered 2024-11-04: 4 mg via ORAL
  Filled 2024-11-04: qty 2

## 2024-11-04 MED ORDER — SODIUM CHLORIDE 0.9 % IV SOLN: INTRAVENOUS | Status: AC

## 2024-11-04 MED ORDER — TAMSULOSIN HCL 0.4 MG PO CAPS
0.4000 mg | ORAL_CAPSULE | Freq: Every day | ORAL | Status: DC
  Administered 2024-11-05 (×2): 0.4 mg via ORAL
  Filled 2024-11-04 (×2): qty 1

## 2024-11-04 MED ORDER — INSULIN ASPART 100 UNIT/ML IJ SOLN: 0.0000 [IU] | Freq: Every day | INTRAMUSCULAR | Status: DC

## 2024-11-04 MED ORDER — LINACLOTIDE 145 MCG PO CAPS
145.0000 ug | ORAL_CAPSULE | Freq: Every morning | ORAL | Status: DC
  Administered 2024-11-05 – 2024-11-06 (×2): 145 ug via ORAL
  Filled 2024-11-04 (×2): qty 1

## 2024-11-04 MED ORDER — PANTOPRAZOLE SODIUM 40 MG PO TBEC
40.0000 mg | DELAYED_RELEASE_TABLET | Freq: Two times a day (BID) | ORAL | Status: DC
  Administered 2024-11-05 – 2024-11-06 (×3): 40 mg via ORAL
  Filled 2024-11-04 (×3): qty 1

## 2024-11-04 MED ORDER — HYDROMORPHONE HCL 2 MG PO TABS
2.0000 mg | ORAL_TABLET | Freq: Once | ORAL | Status: AC
  Administered 2024-11-05: 2 mg via ORAL
  Filled 2024-11-04: qty 1

## 2024-11-04 MED ORDER — AMLODIPINE BESYLATE 5 MG PO TABS: 10.0000 mg | ORAL_TABLET | Freq: Every morning | ORAL | Status: DC

## 2024-11-04 MED ORDER — POLYETHYLENE GLYCOL 3350 17 G PO PACK
17.0000 g | PACK | Freq: Every day | ORAL | Status: DC
  Filled 2024-11-04: qty 1

## 2024-11-04 MED ORDER — SENNOSIDES-DOCUSATE SODIUM 8.6-50 MG PO TABS
1.0000 | ORAL_TABLET | Freq: Two times a day (BID) | ORAL | Status: DC
  Administered 2024-11-05: 1 via ORAL
  Filled 2024-11-04: qty 1

## 2024-11-04 MED ORDER — DULOXETINE HCL 60 MG PO CPEP
60.0000 mg | ORAL_CAPSULE | Freq: Every morning | ORAL | Status: DC
  Administered 2024-11-05 – 2024-11-06 (×2): 60 mg via ORAL
  Filled 2024-11-04: qty 2
  Filled 2024-11-04: qty 1

## 2024-11-04 MED ORDER — ALUM & MAG HYDROXIDE-SIMETH 200-200-20 MG/5ML PO SUSP: 15.0000 mL | ORAL | Status: DC | PRN

## 2024-11-04 MED ORDER — INSULIN ASPART 100 UNIT/ML IJ SOLN
0.0000 [IU] | Freq: Three times a day (TID) | INTRAMUSCULAR | Status: DC
  Administered 2024-11-05 (×2): 3 [IU] via SUBCUTANEOUS
  Filled 2024-11-04: qty 5
  Filled 2024-11-04 (×2): qty 3

## 2024-11-04 MED ORDER — ONDANSETRON HCL 4 MG PO TABS: 4.0000 mg | ORAL_TABLET | Freq: Four times a day (QID) | ORAL | Status: DC | PRN

## 2024-11-04 MED ORDER — FAMOTIDINE 20 MG PO TABS
20.0000 mg | ORAL_TABLET | Freq: Every morning | ORAL | Status: DC
  Administered 2024-11-05 – 2024-11-06 (×2): 20 mg via ORAL
  Filled 2024-11-04 (×2): qty 1

## 2024-11-04 MED ORDER — SODIUM CHLORIDE 0.9 % IV BOLUS
500.0000 mL | Freq: Once | INTRAVENOUS | Status: AC
  Administered 2024-11-04: 500 mL via INTRAVENOUS

## 2024-11-04 MED ORDER — POLYVINYL ALCOHOL 1.4 % OP SOLN
1.0000 [drp] | Freq: Four times a day (QID) | OPHTHALMIC | Status: DC
  Administered 2024-11-05 – 2024-11-06 (×2): 1 [drp] via OPHTHALMIC
  Filled 2024-11-04 (×2): qty 15

## 2024-11-04 MED ORDER — MELATONIN 5 MG PO TABS
10.0000 mg | ORAL_TABLET | Freq: Every day | ORAL | Status: DC
  Administered 2024-11-05 (×2): 10 mg via ORAL
  Filled 2024-11-04 (×2): qty 2

## 2024-11-04 MED ORDER — CARBOXYMETHYLCELLULOSE SODIUM 1 % OP SOLN: 4.0000 [drp] | Freq: Four times a day (QID) | OPHTHALMIC | Status: DC

## 2024-11-04 MED ORDER — HEPARIN SODIUM (PORCINE) 5000 UNIT/ML IJ SOLN
5000.0000 [IU] | Freq: Three times a day (TID) | INTRAMUSCULAR | Status: DC
  Administered 2024-11-05 – 2024-11-06 (×4): 5000 [IU] via SUBCUTANEOUS
  Filled 2024-11-04 (×4): qty 1

## 2024-11-04 MED ORDER — TRAZODONE HCL 50 MG PO TABS: 100.0000 mg | ORAL_TABLET | Freq: Every day | ORAL | Status: DC

## 2024-11-04 MED ORDER — SODIUM CHLORIDE 0.9 % IV BOLUS
1000.0000 mL | Freq: Once | INTRAVENOUS | Status: AC
  Administered 2024-11-04: 1000 mL via INTRAVENOUS

## 2024-11-04 MED ORDER — ONDANSETRON HCL 4 MG/2ML IJ SOLN
4.0000 mg | Freq: Four times a day (QID) | INTRAMUSCULAR | Status: DC | PRN
  Administered 2024-11-06 (×2): 4 mg via INTRAVENOUS
  Filled 2024-11-04 (×2): qty 2

## 2024-11-04 MED ORDER — IOHEXOL 350 MG/ML SOLN
75.0000 mL | Freq: Once | INTRAVENOUS | Status: AC | PRN
  Administered 2024-11-04: 75 mL via INTRAVENOUS

## 2024-11-04 MED ORDER — BRIMONIDINE TARTRATE 0.2 % OP SOLN
1.0000 [drp] | Freq: Two times a day (BID) | OPHTHALMIC | Status: DC
  Administered 2024-11-05 – 2024-11-06 (×2): 1 [drp] via OPHTHALMIC
  Filled 2024-11-04 (×2): qty 5

## 2024-11-04 MED ORDER — FENTANYL CITRATE (PF) 50 MCG/ML IJ SOSY
50.0000 ug | PREFILLED_SYRINGE | Freq: Once | INTRAMUSCULAR | Status: AC
  Administered 2024-11-04: 50 ug via INTRAVENOUS
  Filled 2024-11-04: qty 1

## 2024-11-04 MED ORDER — SERTRALINE HCL 50 MG PO TABS
50.0000 mg | ORAL_TABLET | Freq: Every morning | ORAL | Status: DC
  Administered 2024-11-05: 50 mg via ORAL
  Filled 2024-11-04: qty 1

## 2024-11-04 MED ORDER — IPRATROPIUM-ALBUTEROL 0.5-2.5 (3) MG/3ML IN SOLN: 3.0000 mL | Freq: Four times a day (QID) | RESPIRATORY_TRACT | Status: DC | PRN

## 2024-11-04 MED ORDER — ATORVASTATIN CALCIUM 40 MG PO TABS
40.0000 mg | ORAL_TABLET | Freq: Every day | ORAL | Status: DC
  Administered 2024-11-05 (×2): 40 mg via ORAL
  Filled 2024-11-04 (×2): qty 1

## 2024-11-04 MED ORDER — ASPIRIN 81 MG PO TBEC
81.0000 mg | DELAYED_RELEASE_TABLET | Freq: Every day | ORAL | Status: DC
  Administered 2024-11-05 – 2024-11-06 (×2): 81 mg via ORAL
  Filled 2024-11-04 (×2): qty 1

## 2024-11-04 NOTE — ED Triage Notes (Signed)
 Pt bib ems from [place], had a fall. Now pt with worsening pain to lower back. Pt did have LOC when he fell. Not on blood thinners. VSS with ems.

## 2024-11-04 NOTE — ED Notes (Signed)
Pt asking for more pain meds, MD aware

## 2024-11-04 NOTE — ED Notes (Signed)
 Pt lying supine on stretcher, requesting more pain medications. Awaiting MD assessment, no further orders or medications available at this time.

## 2024-11-04 NOTE — ED Notes (Signed)
 Patient states, Am I in the hospital or in a storage facility? Continues to curse randomly and yell.

## 2024-11-04 NOTE — ED Notes (Signed)
 Pt yelling at staff demanding more pain meds stating Im still in pain and you haven't done nothing. RN explained to patient he has received fentanyl  and dilaudid  for pain and is not due for more medications at this time. Pt continued yelling and told this writer he is going to rip this IV out and walk out of here if you dont get away from me. Pt in NAD, lying on stretcher, using cell phone, safety maintained.

## 2024-11-04 NOTE — ED Notes (Signed)
 Provided pt bag lunch and beverage

## 2024-11-04 NOTE — ED Notes (Signed)
 Pt transported to CT ?

## 2024-11-04 NOTE — ED Notes (Signed)
 MD messaged to verify lab draw order as full set of labs was drawn at 1800. Awaiting response

## 2024-11-04 NOTE — ED Notes (Signed)
 Patient yelling in the hall at staff, demanding pain medicine and food.

## 2024-11-04 NOTE — H&P (Signed)
 " History and Physical    Brett White FMW:[id number] DOB: 1961/07/07 DOA: 11/04/2024  PCP: [facility]  Patient coming from: [place]  I have personally briefly reviewed patient's old medical records in [facility] Health Link  Chief Complaint: Passing out  HPI: Brett White is a 64 y.o. male with medical history significant of hypertension, hyperlipidemia, type 2 diabetes mellitus, constipation, GERD, stroke and BPH presented to ED with a complaint of passing out.  History provided by the ED physician and I personally obtained history from the patient.  Per patient, he was at the [facility] today getting signed up for the insurance.  When he stood up from the seat, he passed out.  Shortly after, he regained consciousness.  Somebody helped him sit up in the chair.  He said for few minutes and then when he stood up, he had another episode of passing out.  He rested at the [facility] for a little.  He was then drove back to his facility/outlined by his friend.  According to him, when he was at the facility, yet again, when he was standing up from the sitting position, he lost consciousness.  According to him, he thinks he lost consciousness only for few seconds during the first 2 times but does not know how long he was out for the third time.  He denies having any prodromal symptoms prior to episode such as chest pain, dizziness, nausea, vomiting or any other complaint.  He tells me that no one checked his blood pressure during any of those 3 episodes.  Currently he has no complaints.  Denies any trauma to the head.  No previous history of syncope but a history of stroke in the past.  ED Course: Upon arrival to ED, patient was hemodynamically stable.  CBC indicates hemoconcentration and BMP indicates AKI.  Also had lactic acidosis.  Received 1.5 L of IV fluid bolus, lactic acidosis resolved.  Underwent extensive trauma workup including CT head, CT cervical spine and CT chest abdomen and pelvis with  contrast, all of them were unremarkable.  Hospitalist was called for admission.  Review of Systems: As per HPI otherwise negative.    Past Medical History:  Diagnosis Date   CAP (community acquired pneumonia) 12/09/2021   CKD (chronic kidney disease)    GERD (gastroesophageal reflux disease)    HTN (hypertension)    Insulin  dependent type 2 diabetes mellitus (HCC)    Neuropathy    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   Stroke [facility])     Past Surgical History:  Procedure Laterality Date   ABDOMINAL AORTOGRAM W/LOWER EXTREMITY N/A 06/20/2023   Procedure: ABDOMINAL AORTOGRAM W/LOWER EXTREMITY;  Surgeon: Gretta Lonni PARAS, MD;  Location: [facility] INVASIVE CV LAB;  Service: Cardiovascular;  Laterality: N/A;   AMPUTATION Left 02/04/2021   Procedure: LEFT GREAT TOE AMPUTATION;  Surgeon: Harden Jerona GAILS, MD;  Location: [facility] OR;  Service: Orthopedics;  Laterality: Left;   AMPUTATION Left 02/10/2021   Procedure: LEFT FOOT 1ST RAY AMPUTATION;  Surgeon: Harden Jerona GAILS, MD;  Location[facility] OR;  Service: Orthopedics;  Laterality: Left;   AMPUTATION Left 03/22/2021   Procedure: LEFT BELOW KNEE AMPUTATION;  Surgeon: Harden Jerona GAILS, MD;  Location: [facility] OR;  Service: Orthopedics;  Laterality: Left;   APPENDECTOMY     BACK SURGERY     CATARACT EXTRACTION     ESOPHAGOGASTRODUODENOSCOPY N/A 04/24/2024   Procedure: EGD (ESOPHAGOGASTRODUODENOSCOPY);  Surgeon: Nandigam, Kavitha V, MD;  Location: [facility] ENDOSCOPY;  Service: Gastroenterology;  Laterality: N/A;   I & D EXTREMITY Left 03/03/2021   Procedure: LEFT FOOT DEBRIDEMENT;  Surgeon: Harden Jerona GAILS, MD;  Location: [facility] OR;  Service: Orthopedics;  Laterality: Left;   INCISION AND DRAINAGE ABSCESS N/A 10/06/2021   Procedure: INCISION AND DRAINAGE BACK ABSCESS;  Surgeon: Signe Mitzie LABOR, MD;  Location: [facility] OR;  Service: General;  Laterality: N/A;   INJECTION OF SILICONE OIL Left 07/25/2023   Procedure: INJECTION OF SILICONE OIL;  Surgeon: Valdemar Rogue, MD;  Location: [facility] OR;  Service: Ophthalmology;  Laterality: Left;   MEMBRANE PEEL Left 07/25/2023   Procedure: MEMBRANE PEEL;  Surgeon: Valdemar Rogue, MD;  Location: [facility] OR;  Service: Ophthalmology;  Laterality: Left;   PARS PLANA VITRECTOMY Left 07/25/2023   Procedure: PARS PLANA VITRECTOMY WITH 25 GAUGE;  Surgeon: Valdemar Rogue, MD;  Location: [facility] OR;  Service: Ophthalmology;  Laterality: Left;   removal of back cyst     TONSILLECTOMY       reports that he has never smoked. He has never used smokeless tobacco. He reports that he does not currently use alcohol . He reports that he does not currently use drugs.  Allergies[1]  Family History  Problem Relation Age of Onset   Diabetes Mother    Heart attack Neg Hx     Prior to Admission medications  Medication Sig Start Date End Date Taking? Authorizing Provider  acetaminophen  (TYLENOL ) 325 MG tablet Take 2 tablets (650 mg total) by mouth every 6 (six) hours as needed for mild pain (pain score 1-3), fever or headache. 04/27/24   Sheikh, Omair Latif, DO  alum & mag hydroxide-simeth (MAALOX/MYLANTA) 200-200-20 MG/5ML suspension Take 15 mLs by mouth every 4 (four) hours as needed for indigestion or heartburn. 09/09/24   Cheryle Page, MD  amLODipine  (NORVASC ) 10 MG tablet Take 10 mg by mouth every morning. 05/11/22   [provider]  artificial tears ophthalmic solution Place 1 drop into the left eye 4 (four) times daily. 04/27/24   Sherrill Cable Latif, DO  aspirin  EC 81 MG tablet Take 1 tablet (81 mg total) by mouth daily. Swallow whole. 06/22/24   Verlin Lonni BIRCH, MD  atorvastatin  (LIPITOR) 40 MG tablet Take 40 mg by mouth at bedtime. 11/07/21   [provider]  bisacodyl  (BISACODYL  LAXATIVE) 10 MG suppository Place 10 mg rectally daily as needed for moderate constipation.    [provider]  brimonidine  (ALPHAGAN ) 0.2 % ophthalmic solution Place 1 drop into the left eye in the morning and at bedtime.  08/29/23   [provider]  carboxymethylcellulose 1 % ophthalmic solution Place 4 drops into the left eye in the morning, at noon, in the evening, and at bedtime.    [provider]  DULoxetine  (CYMBALTA ) 60 MG capsule Take 60 mg by mouth in the morning. 08/28/23   [provider]  ergocalciferol  (VITAMIN D2) 1.25 MG (50000 UT) capsule Take 50,000 Units by mouth once a week. Fridays    [provider]  famotidine  (PEPCID ) 20 MG tablet Take 20 mg by mouth in the morning. 04/20/23   [provider]  FARXIGA  10 MG TABS tablet Take 10 mg by mouth every morning. Dapaglifozin 02/07/22   [provider]  feeding supplement (ENSURE PLUS HIGH PROTEIN) LIQD Take 237 mLs by mouth 2 (two) times daily between meals. Patient taking differently: Take 237 mLs by mouth as needed. 04/19/24   Briana Elgin LABOR, MD  furosemide  (LASIX ) 20 MG tablet Take  20 mg by mouth every morning. 05/11/22   [provider]  HYDROmorphone  (DILAUDID ) 4 MG tablet Take 4 mg by mouth every 6 (six) hours as needed for severe pain (pain score 7-10). 06/10/24   [provider]  insulin  lispro (HUMALOG KWIKPEN) 100 UNIT/ML KwikPen Inject 2 Units into the skin 3 (three) times daily as needed (per sliding scale). Inject per sliding scale: If 201-250 = 2 units; if 251-300 = 4 units; if 301-350 = 6 units; if 351-400 = 8 units; for greater than 401, notify provider.    [provider]  ipratropium-albuterol  (DUONEB) 0.5-2.5 (3) MG/3ML SOLN Take 3 mLs by nebulization every 6 (six) hours as needed. 04/27/24   Sherrill Cable Latif, DO  LINZESS  145 MCG CAPS capsule Take 145 mcg by mouth in the morning. 06/11/23   [provider]  Magnesium  Hydroxide (MILK OF MAGNESIA PO) Take 30 mLs by mouth as needed (for constipation).    [provider]  Melatonin 5 MG CAPS Take 10 mg by mouth at bedtime.    [provider]  metoprolol  tartrate (LOPRESSOR ) 100 MG tablet  Take one tablet by mouth 2 hours prior to CT Scan 06/11/24   Verlin Lonni BIRCH, MD  OVER THE COUNTER MEDICATION Place 1 enema rectally daily as needed (constipation, if no relief from suppository). **Sodium Phosphate  Enema**    [provider]  pantoprazole  (PROTONIX ) 40 MG tablet Take 1 tablet (40 mg total) by mouth 2 (two) times daily before a meal. 04/27/24   Sheikh, Omair Latif, DO  polyethylene glycol (MIRALAX  / GLYCOLAX ) 17 g packet Take 17 g by mouth daily. 11/18/22   Krishnan, Gokul, MD  pregabalin  (LYRICA ) 100 MG capsule Take 1 capsule (100 mg total) by mouth 2 (two) times daily. 04/19/24   Briana Elgin LABOR, MD  senna-docusate (SENOKOT-S) 8.6-50 MG tablet Take 1 tablet by mouth 2 (two) times daily. 04/27/24   Sherrill Cable Latif, DO  sertraline  (ZOLOFT ) 50 MG tablet Take 50 mg by mouth in the morning. 04/30/23   [provider]  tamsulosin  (FLOMAX ) 0.4 MG CAPS capsule Take 0.4 mg by mouth at bedtime. 05/20/24   [provider]  traZODone  (DESYREL ) 100 MG tablet Take 100 mg by mouth at bedtime.    [provider]  TRULICITY  1.5 MG/0.5ML SOPN Inject 0.5 mg into the skin once a week. On Thursdays 04/04/23   [provider]    Physical Exam: Vitals:   11/04/24 1653 11/04/24 2045  BP: 133/69 (!) 156/89  Pulse: (!) 102 88  Resp: 18 16  Temp: 98.3 F (36.8 C) 98 F (36.7 C)  TempSrc: Oral Oral  SpO2: 99% 99%    Constitutional: NAD, calm, comfortable Vitals:   11/04/24 1653 11/04/24 2045  BP: 133/69 (!) 156/89  Pulse: (!) 102 88  Resp: 18 16  Temp: 98.3 F (36.8 C) 98 F (36.7 C)  TempSrc: Oral Oral  SpO2: 99% 99%   Eyes: PERRL, lids and conjunctivae normal ENMT: Mucous membranes are dry. Posterior pharynx clear of any exudate or lesions.Normal dentition.  Neck: normal, supple, no masses, no thyromegaly Respiratory: clear to auscultation bilaterally, no wheezing, no crackles. Normal respiratory effort. No accessory muscle use.   Cardiovascular: Regular rate and rhythm, no murmurs / rubs / gallops. No extremity edema. 2+ pedal pulses. No carotid bruits.  Abdomen: no tenderness, no masses palpated. No hepatosplenomegaly. Bowel sounds positive.  Musculoskeletal: Right BKA. Skin: no rashes, lesions, ulcers. No induration Neurologic: CN 2-12 grossly  intact. Sensation intact, DTR normal. Strength 5/5 in all 4.  Psychiatric: Normal judgment and insight. Alert and oriented x 3. Normal mood.    Labs on Admission: I have personally reviewed following labs and imaging studies  CBC: Recent Labs  Lab 11/04/24 1754  WBC 10.1  NEUTROABS 8.4*  HGB 13.2  HCT 42.3  MCV 79.2*  PLT 278   Basic Metabolic Panel: Recent Labs  Lab 11/04/24 1754  NA 136  K 4.3  CL 96*  CO2 23  GLUCOSE 169*  BUN 26*  CREATININE 1.94*  CALCIUM  9.6  MG 2.0   GFR: CrCl cannot be calculated (Unknown ideal weight.). Liver Function Tests: Recent Labs  Lab 11/04/24 1754  AST 44*  ALT 33  ALKPHOS 146*  BILITOT 0.5  PROT 7.9  ALBUMIN  4.4   No results for input(s): LIPASE, AMYLASE in the last 168 hours. No results for input(s): AMMONIA in the last 168 hours. Coagulation Profile: No results for input(s): INR, PROTIME in the last 168 hours. Cardiac Enzymes: No results for input(s): CKTOTAL, CKMB, CKMBINDEX, TROPONINI in the last 168 hours. BNP (last 3 results) No results for input(s): PROBNP in the last 8760 hours. HbA1C: No results for input(s): HGBA1C in the last 72 hours. CBG: No results for input(s): GLUCAP in the last 168 hours. Lipid Profile: No results for input(s): CHOL, HDL, LDLCALC, TRIG, CHOLHDL, LDLDIRECT in the last 72 hours. Thyroid Function Tests: Recent Labs    11/04/24 1755  TSH 0.901   Anemia Panel: No results for input(s): VITAMINB12, FOLATE, FERRITIN, TIBC, IRON, RETICCTPCT in the last 72 hours. Urine analysis:    Component Value Date/Time   COLORURINE  STRAW (A) 09/07/2024 1017   APPEARANCEUR CLEAR 09/07/2024 1017   LABSPEC 1.013 09/07/2024 1017   PHURINE 5.0 09/07/2024 1017   GLUCOSEU >=500 (A) 09/07/2024 1017   HGBUR NEGATIVE 09/07/2024 1017   BILIRUBINUR NEGATIVE 09/07/2024 1017   KETONESUR NEGATIVE 09/07/2024 1017   PROTEINUR NEGATIVE 09/07/2024 1017   NITRITE NEGATIVE 09/07/2024 1017   LEUKOCYTESUR NEGATIVE 09/07/2024 1017    Radiological Exams on Admission: CT CHEST ABDOMEN PELVIS W CONTRAST Result Date: 11/04/2024 CLINICAL DATA:  Syncope, back pain EXAM: CT CHEST, ABDOMEN, AND PELVIS WITH CONTRAST TECHNIQUE: Multidetector CT imaging of the chest, abdomen and pelvis was performed following the standard protocol during bolus administration of intravenous contrast. RADIATION DOSE REDUCTION: This exam was performed according to the departmental dose-optimization program which includes automated exposure control, adjustment of the mA and/or kV according to patient size and/or use of iterative reconstruction technique. CONTRAST:  75mL OMNIPAQUE  IOHEXOL  350 MG/ML SOLN COMPARISON:  09/05/2024 FINDINGS: CT CHEST FINDINGS Cardiovascular: The heart is unremarkable without pericardial effusion. No evidence of acute vascular injury. No evidence of thoracic aortic aneurysm or dissection. Atherosclerosis of the aorta and coronary vasculature. Mediastinum/Nodes: No enlarged mediastinal, hilar, or axillary lymph nodes. Thyroid gland, trachea, and esophagus demonstrate no significant findings. Lungs/Pleura: Scattered areas of subsegmental atelectasis are seen at the lung bases. No acute airspace disease, effusion, or pneumothorax. The central airways are patent. Musculoskeletal: There are no acute displaced fractures. Reconstructed images demonstrate no additional findings. CT ABDOMEN PELVIS FINDINGS Hepatobiliary: No hepatic injury or perihepatic hematoma. Gallbladder is unremarkable. Pancreas: Unremarkable. No pancreatic ductal dilatation or surrounding  inflammatory changes. Spleen: No splenic injury or perisplenic hematoma. Adrenals/Urinary Tract: No evidence of acute renal injury. Multiple nonobstructing right renal calculi are again noted, largest measuring 11 mm. Stable indeterminate 12 mm hypodense lesion off the lower pole right  kidney reference image 87/3. The adrenals and bladder are unremarkable. Stomach/Bowel: No bowel obstruction or ileus. No bowel wall thickening or inflammatory change. Vascular/Lymphatic: Aortic atherosclerosis. No enlarged abdominal or pelvic lymph nodes. Reproductive: Prostate is unremarkable. Other: No free fluid or free intraperitoneal gas. Small fat containing umbilical hernia. No bowel herniation. Musculoskeletal: No acute or destructive bony abnormalities. Stable postsurgical changes within the lower lumbar spine. Reconstructed images demonstrate no additional findings. IMPRESSION: 1. No acute intrathoracic, intra-abdominal, or intrapelvic trauma. 2. Stable indeterminate 12 mm right renal lesion. Nonemergent outpatient dedicated renal CT or MRI with and without contrast is again recommended. 3. Stable nonobstructing right renal calculi. 4.  Aortic Atherosclerosis (ICD10-I70.0). Electronically Signed   By: Ozell Daring M.D.   On: 11/04/2024 20:01   CT Cervical Spine Wo Contrast Result Date: 11/04/2024 CLINICAL DATA:  Clemens, loss of consciousness EXAM: CT CERVICAL SPINE WITHOUT CONTRAST TECHNIQUE: Multidetector CT imaging of the cervical spine was performed without intravenous contrast. Multiplanar CT image reconstructions were also generated. RADIATION DOSE REDUCTION: This exam was performed according to the departmental dose-optimization program which includes automated exposure control, adjustment of the mA and/or kV according to patient size and/or use of iterative reconstruction technique. COMPARISON:  09/05/2024 FINDINGS: Alignment: Stable straightening of the cervical spine due to multilevel degenerative changes. Skull  base and vertebrae: No acute fracture. No primary bone lesion or focal pathologic process. Soft tissues and spinal canal: No prevertebral fluid or swelling. No visible canal hematoma. Asymmetric soft tissue prominence at the left base of tongue is again noted, incompletely characterized on this unenhanced exam. Disc levels: Stable multilevel spondylosis and facet hypertrophy, with spondylosis greatest at C3-4 and C5-6. Bony fusion across the C6-7 level. Upper chest: Airway is patent.  Lung apices are clear. Other: Reconstructed images demonstrate no additional findings. IMPRESSION: 1. No acute cervical spine fracture. 2. Stable multilevel cervical degenerative changes. 3. Stable asymmetric soft tissue prominence at the left base of tongue. Underlying infiltrative mass cannot be excluded on this unenhanced exam, and correlation with direct visual inspection is recommended. Electronically Signed   By: Ozell Daring M.D.   On: 11/04/2024 19:55   CT Head Wo Contrast Result Date: 11/04/2024 CLINICAL DATA:  Clemens, loss of consciousness EXAM: CT HEAD WITHOUT CONTRAST TECHNIQUE: Contiguous axial images were obtained from the base of the skull through the vertex without intravenous contrast. RADIATION DOSE REDUCTION: This exam was performed according to the departmental dose-optimization program which includes automated exposure control, adjustment of the mA and/or kV according to patient size and/or use of iterative reconstruction technique. COMPARISON:  09/05/2024 FINDINGS: Brain: Stable chronic small-vessel ischemic changes within the basal ganglia. No evidence of acute infarct or hemorrhage. The lateral ventricles and remaining midline structures are unremarkable. No acute extra-axial fluid collections. No mass effect. Vascular: No hyperdense vessel or unexpected calcification. Skull: Normal. Negative for fracture or focal lesion. Sinuses/Orbits: Stable postsurgical changes within the left globe. No acute abnormality.  Other: None. IMPRESSION: 1. No acute intracranial process. Electronically Signed   By: Ozell Daring M.D.   On: 11/04/2024 19:51    EKG: Independently reviewed.  Normal sinus rhythm with no acute ST-T wave changes.  Assessment/Plan Principal Problem:   Syncope   Recurrent syncope/dehydration, POA: Per patient, it happened 3 times as mentioned above.  I suspect likely hypovolemic, hypotensive syncope based on the fact that he is dehydrated with AKI and lactic acidosis.  Although blood pressure is better now but I am going to hold his antihypertensives.  Aggressively hydrating for next 15 hours.  Transthoracic echo to rule out aortic stenosis.  Monitor on telemetry.  Also, patient appears to be on several medications which can sedate him so polypharmacy is also one of the concern.  However med rec is pending at this point in time.  Patient tells me that he does not take trazodone  or melatonin at night.  Dehydration/AKI: Received fluids in the ED as mentioned above, continue fluids.  Repeat labs in the morning.  Type 2 diabetes mellitus: PTA medications include Farxiga , Humalog and Trulicity .  Check hemoglobin A1c, start on SSI only for now.  History of CVA: Resume aspirin .  Constipation: Resume home medications.  BPH: Resume Flomax .  GERD: Resume home medications.  Depression: Resume Zoloft .  Hyperlipidemia: Resume statin.  DVT prophylaxis: heparin  injection 5,000 Units Start: 11/04/24 2200 Code Status: DNR Family Communication: None present at bedside.  Plan of care discussed with patient in length and he verbalized understanding and agreed with it. Disposition Plan: Potential discharge tomorrow. Consults called: None  Fredia Skeeter MD [facility]  *Please note that this is a verbal dictation therefore any spelling or grammatical errors are due to the Dragon Medical One system interpretation.  Please page via Amion and do not message via secure chat for urgent patient  care matters. Secure chat can be used for non urgent patient care matters. 11/04/2024, 9:55 PM  To contact the attending provider between 7A-7P or the covering provider during after hours 7P-7A, please log into the web site [email or web address]     [1] No Known Allergies  "

## 2024-11-04 NOTE — ED Provider Notes (Signed)
 " [place] [facility] Provider Note   CSN: [id number] Arrival date & time: 11/04/24  1652     Patient presents with: No chief complaint on file.   Brett White is a 64 y.o. male.   HPI   Patient presents because of syncope.  Patient states he lost consciousness 3 times today.  Patient states that happened twice when walking around in [place].  He states he did not make no prodromal symptoms.  Happen randomly in nature.  Also happen once when he was sitting down back at his facility.  Was a sitting there when he subsequently became unresponsive per his roommate.  States that he did hit his head.  Does not take ambulation.  Endorsing lumbar pain which is somewhat new for him.  No chest pain or shortness of breath at this time.  Patient states been having episodes of vomiting every morning for the past week.  Subsequently able to tolerate p.o. later in afternoon at night.  Otherwise, no chest pain.  No exertional chest pain.  No shortness of breath.  Previous medical history reviewed : Patient was last admitted in December 2025.  AKI.  Improved after IV fluids.   Prior to Admission medications  Medication Sig Start Date End Date Taking? Authorizing Provider  acetaminophen  (TYLENOL ) 325 MG tablet Take 2 tablets (650 mg total) by mouth every 6 (six) hours as needed for mild pain (pain score 1-3), fever or headache. 04/27/24   Sheikh, Omair Latif, DO  alum & mag hydroxide-simeth (MAALOX/MYLANTA) 200-200-20 MG/5ML suspension Take 15 mLs by mouth every 4 (four) hours as needed for indigestion or heartburn. 09/09/24   Cheryle Page, MD  amLODipine  (NORVASC ) 10 MG tablet Take 10 mg by mouth every morning. 05/11/22   [provider]  artificial tears ophthalmic solution Place 1 drop into the left eye 4 (four) times daily. 04/27/24   Sherrill Cable Latif, DO  aspirin  EC 81 MG tablet Take 1 tablet (81 mg total) by mouth daily. Swallow whole. 06/22/24   Verlin Lonni BIRCH, MD  atorvastatin  (LIPITOR) 40 MG tablet Take 40 mg by mouth at bedtime. 11/07/21   [provider]  bisacodyl  (BISACODYL  LAXATIVE) 10 MG suppository Place 10 mg rectally daily as needed for moderate constipation.    [provider]  brimonidine  (ALPHAGAN ) 0.2 % ophthalmic solution Place 1 drop into the left eye in the morning and at bedtime. 08/29/23   [provider]  carboxymethylcellulose 1 % ophthalmic solution Place 4 drops into the left eye in the morning, at noon, in the evening, and at bedtime.    [provider]  DULoxetine  (CYMBALTA ) 60 MG capsule Take 60 mg by mouth in the morning. 08/28/23   [provider]  ergocalciferol  (VITAMIN D2) 1.25 MG (50000 UT) capsule Take 50,000 Units by mouth once a week. Fridays    [provider]  famotidine  (PEPCID ) 20 MG tablet Take 20 mg by mouth in the morning. 04/20/23   [provider]  FARXIGA  10 MG TABS tablet Take 10 mg by mouth every morning. Dapaglifozin 02/07/22   [provider]  feeding supplement (ENSURE PLUS HIGH PROTEIN) LIQD Take 237 mLs by mouth 2 (two) times daily between meals. Patient taking differently: Take 237 mLs by mouth as needed. 04/19/24   Briana Elgin LABOR, MD  furosemide  (LASIX ) 20 MG tablet Take 20 mg by mouth every morning. 05/11/22   [provider]  HYDROmorphone  (DILAUDID ) 4 MG tablet  Take 4 mg by mouth every 6 (six) hours as needed for severe pain (pain score 7-10). 06/10/24   [provider]  insulin  lispro (HUMALOG KWIKPEN) 100 UNIT/ML KwikPen Inject 2 Units into the skin 3 (three) times daily as needed (per sliding scale). Inject per sliding scale: If 201-250 = 2 units; if 251-300 = 4 units; if 301-350 = 6 units; if 351-400 = 8 units; for greater than 401, notify provider.    [provider]  ipratropium-albuterol  (DUONEB) 0.5-2.5 (3) MG/3ML SOLN Take 3 mLs by nebulization every 6 (six) hours as needed. 04/27/24    Sherrill Cable Latif, DO  LINZESS  145 MCG CAPS capsule Take 145 mcg by mouth in the morning. 06/11/23   [provider]  Magnesium  Hydroxide (MILK OF MAGNESIA PO) Take 30 mLs by mouth as needed (for constipation).    [provider]  Melatonin 5 MG CAPS Take 10 mg by mouth at bedtime.    [provider]  metoprolol  tartrate (LOPRESSOR ) 100 MG tablet Take one tablet by mouth 2 hours prior to CT Scan 06/11/24   Verlin Lonni BIRCH, MD  OVER THE COUNTER MEDICATION Place 1 enema rectally daily as needed (constipation, if no relief from suppository). **Sodium Phosphate  Enema**    [provider]  pantoprazole  (PROTONIX ) 40 MG tablet Take 1 tablet (40 mg total) by mouth 2 (two) times daily before a meal. 04/27/24   Sheikh, Omair Latif, DO  polyethylene glycol (MIRALAX  / GLYCOLAX ) 17 g packet Take 17 g by mouth daily. 11/18/22   Krishnan, Gokul, MD  pregabalin  (LYRICA ) 100 MG capsule Take 1 capsule (100 mg total) by mouth 2 (two) times daily. 04/19/24   Briana Elgin LABOR, MD  senna-docusate (SENOKOT-S) 8.6-50 MG tablet Take 1 tablet by mouth 2 (two) times daily. 04/27/24   Sherrill Cable Latif, DO  sertraline  (ZOLOFT ) 50 MG tablet Take 50 mg by mouth in the morning. 04/30/23   [provider]  tamsulosin  (FLOMAX ) 0.4 MG CAPS capsule Take 0.4 mg by mouth at bedtime. 05/20/24   [provider]  traZODone  (DESYREL ) 100 MG tablet Take 100 mg by mouth at bedtime.    [provider]  TRULICITY  1.5 MG/0.5ML SOPN Inject 0.5 mg into the skin once a week. On Thursdays 04/04/23   [provider]    Allergies: Patient has no known allergies.    Review of Systems  Constitutional:  Negative for chills and fever.  HENT:  Negative for ear pain and sore throat.   Eyes:  Negative for pain and visual disturbance.  Respiratory:  Negative for cough and shortness of breath.   Cardiovascular:  Negative for chest pain and palpitations.  Gastrointestinal:   Negative for abdominal pain and vomiting.  Genitourinary:  Negative for dysuria and hematuria.  Musculoskeletal:  Negative for arthralgias and back pain.  Skin:  Negative for color change and rash.  Neurological:  Negative for seizures and syncope.  All other systems reviewed and are negative.   Updated Vital Signs BP (!) 156/89 (BP Location: Left Arm)   Pulse 88   Temp 98 F (36.7 C) (Oral)   Resp 16   SpO2 99%   Physical Exam Vitals and nursing note reviewed.  Constitutional:      General: He is not in acute distress.    Appearance: He is well-developed.  HENT:     Head: Normocephalic and atraumatic.  Eyes:     Conjunctiva/sclera: Conjunctivae normal.  Cardiovascular:     Rate  and Rhythm: Normal rate and regular rhythm.     Heart sounds: No murmur heard. Pulmonary:     Effort: Pulmonary effort is normal. No respiratory distress.     Breath sounds: Normal breath sounds.  Abdominal:     Palpations: Abdomen is soft.     Tenderness: There is no abdominal tenderness.  Musculoskeletal:        General: No swelling.     Cervical back: Neck supple.  Skin:    General: Skin is warm and dry.     Capillary Refill: Capillary refill takes less than 2 seconds.  Neurological:     Mental Status: He is alert.  Psychiatric:        Mood and Affect: Mood normal.     (all labs ordered are listed, but only abnormal results are displayed) Labs Reviewed  CBC WITH DIFFERENTIAL/PLATELET - Abnormal; Notable for the following components:      Result Value   MCV 79.2 (*)    MCH 24.7 (*)    RDW 17.1 (*)    Neutro Abs 8.4 (*)    All other components within normal limits  COMPREHENSIVE METABOLIC PANEL WITH GFR - Abnormal; Notable for the following components:   Chloride 96 (*)    Glucose, Bld 169 (*)    BUN 26 (*)    Creatinine, Ser 1.94 (*)    AST 44 (*)    Alkaline Phosphatase 146 (*)    GFR, Estimated 39 (*)    Anion gap 17 (*)    All other components within normal limits  I-STAT  CG4 LACTIC ACID, ED - Abnormal; Notable for the following components:   Lactic Acid, Venous 3.3 (*)    All other components within normal limits  TROPONIN T, HIGH SENSITIVITY - Abnormal; Notable for the following components:   Troponin T High Sensitivity 31 (*)    All other components within normal limits  TROPONIN T, HIGH SENSITIVITY - Abnormal; Notable for the following components:   Troponin T High Sensitivity 23 (*)    All other components within normal limits  TSH  MAGNESIUM   URINALYSIS, W/ REFLEX TO CULTURE (INFECTION SUSPECTED)  BASIC METABOLIC PANEL WITH GFR  CBC  HEMOGLOBIN A1C  I-STAT CG4 LACTIC ACID, ED    EKG: EKG Interpretation Date/Time:  Wednesday November 04 2024 17:41:06 EST Ventricular Rate:  92 PR Interval:  174 QRS Duration:  94 QT Interval:  352 QTC Calculation: 435 R Axis:   -18  Text Interpretation: Normal sinus rhythm Low voltage QRS Borderline ECG When compared with ECG of 05-Sep-2024 21:00, PREVIOUS ECG IS PRESENT Confirmed by Simon Rea [phone]) on 11/04/2024 8:42:03 PM  Radiology: CT L-SPINE NO CHARGE Result Date: 11/04/2024 EXAM: CT OF THE LUMBAR SPINE WITHOUT CONTRAST 11/04/2024 09:15:00 PM TECHNIQUE: CT of the lumbar spine was performed without the administration of intravenous contrast. Multiplanar reformatted images are provided for review. Automated exposure control, iterative reconstruction, and/or weight based adjustment of the mA/kV was utilized to reduce the radiation dose to as low as reasonably achievable. COMPARISON: None available. CLINICAL HISTORY: FALL WITH BACK PAIN FINDINGS: BONES AND ALIGNMENT: Interbody spacers are present at L3 - L4 and L4 - L5. Normal vertebral body heights. No acute fracture or traumatic malalignment. Normal alignment. DEGENERATIVE CHANGES: Mild multilevel spondylosis. Moderate multilevel facet arthropathy. No severe spinal canal narrowing. SOFT TISSUES: Findings in the abdomen and pelvis reported separately on same day  CT abdomen and pelvis. IMPRESSION: 1. No acute fracture or traumatic malalignment. Electronically signed  by: Norman Gatlin MD 11/04/2024 09:55 PM EST RP Workstation: [id number]   CT CHEST ABDOMEN PELVIS W CONTRAST Result Date: 11/04/2024 CLINICAL DATA:  Syncope, back pain EXAM: CT CHEST, ABDOMEN, AND PELVIS WITH CONTRAST TECHNIQUE: Multidetector CT imaging of the chest, abdomen and pelvis was performed following the standard protocol during bolus administration of intravenous contrast. RADIATION DOSE REDUCTION: This exam was performed according to the departmental dose-optimization program which includes automated exposure control, adjustment of the mA and/or kV according to patient size and/or use of iterative reconstruction technique. CONTRAST:  75mL OMNIPAQUE  IOHEXOL  350 MG/ML SOLN COMPARISON:  09/05/2024 FINDINGS: CT CHEST FINDINGS Cardiovascular: The heart is unremarkable without pericardial effusion. No evidence of acute vascular injury. No evidence of thoracic aortic aneurysm or dissection. Atherosclerosis of the aorta and coronary vasculature. Mediastinum/Nodes: No enlarged mediastinal, hilar, or axillary lymph nodes. Thyroid gland, trachea, and esophagus demonstrate no significant findings. Lungs/Pleura: Scattered areas of subsegmental atelectasis are seen at the lung bases. No acute airspace disease, effusion, or pneumothorax. The central airways are patent. Musculoskeletal: There are no acute displaced fractures. Reconstructed images demonstrate no additional findings. CT ABDOMEN PELVIS FINDINGS Hepatobiliary: No hepatic injury or perihepatic hematoma. Gallbladder is unremarkable. Pancreas: Unremarkable. No pancreatic ductal dilatation or surrounding inflammatory changes. Spleen: No splenic injury or perisplenic hematoma. Adrenals/Urinary Tract: No evidence of acute renal injury. Multiple nonobstructing right renal calculi are again noted, largest measuring 11 mm. Stable indeterminate 12 mm hypodense  lesion off the lower pole right kidney reference image 87/3. The adrenals and bladder are unremarkable. Stomach/Bowel: No bowel obstruction or ileus. No bowel wall thickening or inflammatory change. Vascular/Lymphatic: Aortic atherosclerosis. No enlarged abdominal or pelvic lymph nodes. Reproductive: Prostate is unremarkable. Other: No free fluid or free intraperitoneal gas. Small fat containing umbilical hernia. No bowel herniation. Musculoskeletal: No acute or destructive bony abnormalities. Stable postsurgical changes within the lower lumbar spine. Reconstructed images demonstrate no additional findings. IMPRESSION: 1. No acute intrathoracic, intra-abdominal, or intrapelvic trauma. 2. Stable indeterminate 12 mm right renal lesion. Nonemergent outpatient dedicated renal CT or MRI with and without contrast is again recommended. 3. Stable nonobstructing right renal calculi. 4.  Aortic Atherosclerosis (ICD10-I70.0). Electronically Signed   By: Ozell Daring M.D.   On: 11/04/2024 20:01   CT Cervical Spine Wo Contrast Result Date: 11/04/2024 CLINICAL DATA:  Clemens, loss of consciousness EXAM: CT CERVICAL SPINE WITHOUT CONTRAST TECHNIQUE: Multidetector CT imaging of the cervical spine was performed without intravenous contrast. Multiplanar CT image reconstructions were also generated. RADIATION DOSE REDUCTION: This exam was performed according to the departmental dose-optimization program which includes automated exposure control, adjustment of the mA and/or kV according to patient size and/or use of iterative reconstruction technique. COMPARISON:  09/05/2024 FINDINGS: Alignment: Stable straightening of the cervical spine due to multilevel degenerative changes. Skull base and vertebrae: No acute fracture. No primary bone lesion or focal pathologic process. Soft tissues and spinal canal: No prevertebral fluid or swelling. No visible canal hematoma. Asymmetric soft tissue prominence at the left base of tongue is again  noted, incompletely characterized on this unenhanced exam. Disc levels: Stable multilevel spondylosis and facet hypertrophy, with spondylosis greatest at C3-4 and C5-6. Bony fusion across the C6-7 level. Upper chest: Airway is patent.  Lung apices are clear. Other: Reconstructed images demonstrate no additional findings. IMPRESSION: 1. No acute cervical spine fracture. 2. Stable multilevel cervical degenerative changes. 3. Stable asymmetric soft tissue prominence at the left base of tongue. Underlying infiltrative mass cannot be excluded on this unenhanced exam, and  correlation with direct visual inspection is recommended. Electronically Signed   By: Ozell Daring M.D.   On: 11/04/2024 19:55   CT Head Wo Contrast Result Date: 11/04/2024 CLINICAL DATA:  Clemens, loss of consciousness EXAM: CT HEAD WITHOUT CONTRAST TECHNIQUE: Contiguous axial images were obtained from the base of the skull through the vertex without intravenous contrast. RADIATION DOSE REDUCTION: This exam was performed according to the departmental dose-optimization program which includes automated exposure control, adjustment of the mA and/or kV according to patient size and/or use of iterative reconstruction technique. COMPARISON:  09/05/2024 FINDINGS: Brain: Stable chronic small-vessel ischemic changes within the basal ganglia. No evidence of acute infarct or hemorrhage. The lateral ventricles and remaining midline structures are unremarkable. No acute extra-axial fluid collections. No mass effect. Vascular: No hyperdense vessel or unexpected calcification. Skull: Normal. Negative for fracture or focal lesion. Sinuses/Orbits: Stable postsurgical changes within the left globe. No acute abnormality. Other: None. IMPRESSION: 1. No acute intracranial process. Electronically Signed   By: Ozell Daring M.D.   On: 11/04/2024 19:51     Procedures   Medications Ordered in the ED  aspirin  EC tablet 81 mg (has no administration in time range)   atorvastatin  (LIPITOR) tablet 40 mg (has no administration in time range)  DULoxetine  (CYMBALTA ) DR capsule 60 mg (has no administration in time range)  sertraline  (ZOLOFT ) tablet 50 mg (has no administration in time range)  alum & mag hydroxide-simeth (MAALOX/MYLANTA) 200-200-20 MG/5ML suspension 15 mL (has no administration in time range)  famotidine  (PEPCID ) tablet 20 mg (has no administration in time range)  linaclotide  (LINZESS ) capsule 145 mcg (has no administration in time range)  pantoprazole  (PROTONIX ) EC tablet 40 mg (has no administration in time range)  polyethylene glycol (MIRALAX  / GLYCOLAX ) packet 17 g (has no administration in time range)  senna-docusate (Senokot-S) tablet 1 tablet (has no administration in time range)  tamsulosin  (FLOMAX ) capsule 0.4 mg (has no administration in time range)  Melatonin CAPS 10 mg (has no administration in time range)  ipratropium-albuterol  (DUONEB) 0.5-2.5 (3) MG/3ML nebulizer solution 3 mL (has no administration in time range)  artificial tears ophthalmic solution 1 drop (has no administration in time range)  brimonidine  (ALPHAGAN ) 0.2 % ophthalmic solution 1 drop (has no administration in time range)  carboxymethylcellulose 1 % ophthalmic solution 4 drop (has no administration in time range)  heparin  injection 5,000 Units (has no administration in time range)  0.9 %  sodium chloride  infusion (has no administration in time range)  ondansetron  (ZOFRAN ) tablet 4 mg (has no administration in time range)    Or  ondansetron  (ZOFRAN ) injection 4 mg (has no administration in time range)  HYDROmorphone  (DILAUDID ) tablet 2 mg (0 mg Oral Hold 11/04/24 2226)  fentaNYL  (SUBLIMAZE ) injection 50 mcg (50 mcg Intravenous Given 11/04/24 1734)  sodium chloride  0.9 % bolus 500 mL (0 mLs Intravenous Stopped 11/04/24 1948)  iohexol  (OMNIPAQUE ) 350 MG/ML injection 75 mL (75 mLs Intravenous Contrast Given 11/04/24 1946)  sodium chloride  0.9 % bolus 1,000 mL (1,000 mLs  Intravenous New Bag/Given 11/04/24 2052)  HYDROmorphone  (DILAUDID ) tablet 4 mg (4 mg Oral Given 11/04/24 2051)                                    Medical Decision Making Amount and/or Complexity of Data Reviewed Radiology: ordered.  Risk Prescription drug management. Decision regarding hospitalization.     HPI:   Patient presents  because of syncope.  Patient states he lost consciousness 3 times today.  Patient states that happened twice when walking around in [place].  He states he did not make no prodromal symptoms.  Happen randomly in nature.  Also happen once when he was sitting down back at his facility.  Was a sitting there when he subsequently became unresponsive per his roommate.  States that he did hit his head.  Does not take ambulation.  Endorsing lumbar pain which is somewhat new for him.  No chest pain or shortness of breath at this time.  Patient states been having episodes of vomiting every morning for the past week.  Subsequently able to tolerate p.o. later in afternoon at night.  Otherwise, no chest pain.  No exertional chest pain.  No shortness of breath.  Previous medical history reviewed : Patient was last admitted in December 2025.  AKI.  Improved after IV fluids.  MDM:   Upon examination, patient hemodynamically stable. A&O x 3 with GCS 15.  Attention syncope.  Patient was tell me that he did not go from sitting to standing position.  Happen whenever he is up walking around.  Happened twice.  Also happened once when he was sitting down.  Unclear if syncope.  Heart rate syncope.  Cardiac telemetry sinus rhythm.  EKG sinus rhythm.  No large electrolyte derangements explained this.  Will need to be monitored for this.  AKI.  Creatinine 1.94.  Increased anion gap likely because of uremia.  Lactic acid normal.  Likely in setting of prerenal disease process.  Vomiting in the mornings.  Subsequently patient was given a liter of fluid  Cardiac enzymes flat.  No concerns for  ACS process.  Some generalized lumbar area pain.  No cauda equina signs or symptoms.  CT lumbar spine here.  Given unexplained syncope as well as AKI patient will be placed in observation.     Interventions: 1 L NS   EKG Interpreted by Me: sinus    Cardiac Tele Interpreted by Me: sinus    I have independently interpreted the  CT  images and agree with the radiologist finding   Social Determinant of Health: denies iv drug abuse    Disposition and Follow Up: admit       Final diagnoses:  AKI (acute kidney injury)  Syncope, unspecified syncope type    ED Discharge Orders     None          Simon Lavonia SAILOR, MD 11/04/24 2233  "

## 2024-11-04 NOTE — ED Provider Triage Note (Signed)
 Emergency Medicine Provider Triage Evaluation Note  Brett White , a 64 y.o. male  was evaluated in triage.  Pt complains of pain after syncope in the setting of decreased oral intake for the last week with nausea and vomiting and decreased appetite.  Review of Systems  Positive: Headache, pain across mid back, low back, nausea, vomiting, lightheadedness, fatigue, decreased appetite, and syncope Negative: Denying chest pain, abdominal pain, dysuria, constipation, or diarrhea  Physical Exam  BP 133/69 (BP Location: Right Arm)   Pulse (!) 102   Temp 98.3 F (36.8 C) (Oral)   Resp 18   SpO2 99%  Gen:   Awake, no distress   Resp:  Normal effort  MSK:   Moves extremities without difficulty  Other:  Tenderness on forehead, tenderness across his mid back including the bilateral posterior rib areas, tenderness across his low back and posterior pelvis area.  Medical Decision Making  Medically screening exam initiated at 5:24 PM.  Appropriate orders placed.  Danuel Felicetti was informed that the remainder of the evaluation will be completed by another provider, this initial triage assessment does not replace that evaluation, and the importance of remaining in the ED until their evaluation is complete.  Brett White is a 64 y.o. male with a complex past medical history including hypertension, diabetes, previous left BKA, GERD, hyperlipidemia, previous AAA, previous stroke, reactive airway disease, and peripheral neuropathy who presents with 1 week of fatigue, nausea, vomiting, decreased oral intake and then today at [facility] stood up and had a syncopal episode and has injuries after this.  Patient reports after last week he had a good oral intake with no appetite but has had nausea and vomiting.  He reports he is feeling lightheaded at times but has never had syncope like this before.  He said this morning he still had nausea and vomiting and that was having diaphoresis he cannot explain.  He denied  fevers or chills with it.  He said that he was waiting in line at [facility] when he was sitting for about 20 minutes and stood up and next and he knew he was on the ground.  He is having pain in his head, across his mid back and having pleuritic pain across his back, low back pain, and he is still feeling fatigued.  On my exam, he has tenderness across his low back, across his mid back, and on his forehead.  He does have dry mucous membranes.  No other focal neurologic deficits initially for me.  Chest and abdomen nontender.  Lungs were clear.  Given his 1 week of nausea, vomiting, diarrhea diaphoresis and lightheadedness he will get screening labs we will give some fluids given a dry mucous membranes.  With his syncope although I suspect it was likely orthostatic due to sitting for 20.  He will get see imaging of his head, neck and chest/abdomen/pelvis to look for skull fracture, bleed, or any injuries to his ribs or significant into his back.  He has had back surgeries before.  Anticipate further evaluation when he is seen by provider in exam room.    Brett White, Lonni PARAS, MD 11/04/24 [phone]

## 2024-11-05 ENCOUNTER — Observation Stay (HOSPITAL_COMMUNITY)

## 2024-11-05 LAB — CBG MONITORING, ED
Glucose-Capillary: 152 mg/dL — ABNORMAL HIGH (ref 70–99)
Glucose-Capillary: 163 mg/dL — ABNORMAL HIGH (ref 70–99)
Glucose-Capillary: 176 mg/dL — ABNORMAL HIGH (ref 70–99)
Glucose-Capillary: 201 mg/dL — ABNORMAL HIGH (ref 70–99)

## 2024-11-05 LAB — BASIC METABOLIC PANEL WITH GFR
Anion gap: 9 (ref 5–15)
BUN: 23 mg/dL (ref 8–23)
CO2: 26 mmol/L (ref 22–32)
Calcium: 8.2 mg/dL — ABNORMAL LOW (ref 8.9–10.3)
Chloride: 104 mmol/L (ref 98–111)
Creatinine, Ser: 1.51 mg/dL — ABNORMAL HIGH (ref 0.61–1.24)
GFR, Estimated: 52 mL/min — ABNORMAL LOW
Glucose, Bld: 196 mg/dL — ABNORMAL HIGH (ref 70–99)
Potassium: 4.1 mmol/L (ref 3.5–5.1)
Sodium: 139 mmol/L (ref 135–145)

## 2024-11-05 LAB — HEMOGLOBIN A1C
Hgb A1c MFr Bld: 8.4 % — ABNORMAL HIGH (ref 4.8–5.6)
Mean Plasma Glucose: 194.38 mg/dL

## 2024-11-05 LAB — CBC
HCT: 37.6 % — ABNORMAL LOW (ref 39.0–52.0)
Hemoglobin: 11.8 g/dL — ABNORMAL LOW (ref 13.0–17.0)
MCH: 25.1 pg — ABNORMAL LOW (ref 26.0–34.0)
MCHC: 31.4 g/dL (ref 30.0–36.0)
MCV: 79.8 fL — ABNORMAL LOW (ref 80.0–100.0)
Platelets: 234 10*3/uL (ref 150–400)
RBC: 4.71 MIL/uL (ref 4.22–5.81)
RDW: 17.1 % — ABNORMAL HIGH (ref 11.5–15.5)
WBC: 7.2 10*3/uL (ref 4.0–10.5)
nRBC: 0 % (ref 0.0–0.2)

## 2024-11-05 LAB — URINE DRUG SCREEN
Amphetamines: NEGATIVE
Barbiturates: NEGATIVE
Benzodiazepines: NEGATIVE
Cocaine: NEGATIVE
Fentanyl: POSITIVE — AB
Methadone Scn, Ur: NEGATIVE
Opiates: POSITIVE — AB
Tetrahydrocannabinol: NEGATIVE

## 2024-11-05 LAB — ECHOCARDIOGRAM COMPLETE
Area-P 1/2: 3.75 cm2
Height: 74 in
S' Lateral: 3.4 cm
Weight: 4091.74 [oz_av]

## 2024-11-05 LAB — CK: Total CK: 578 U/L — ABNORMAL HIGH (ref 49–397)

## 2024-11-05 LAB — GLUCOSE, CAPILLARY: Glucose-Capillary: 124 mg/dL — ABNORMAL HIGH (ref 70–99)

## 2024-11-05 MED ORDER — ACETAMINOPHEN 325 MG PO TABS
975.0000 mg | ORAL_TABLET | Freq: Four times a day (QID) | ORAL | Status: DC
  Administered 2024-11-06: 975 mg via ORAL
  Filled 2024-11-05 (×2): qty 3

## 2024-11-05 MED ORDER — POLYETHYLENE GLYCOL 3350 17 G PO PACK: 17.0000 g | PACK | Freq: Every day | ORAL | Status: DC | PRN

## 2024-11-05 MED ORDER — PREGABALIN 25 MG PO CAPS
50.0000 mg | ORAL_CAPSULE | Freq: Two times a day (BID) | ORAL | Status: DC
  Administered 2024-11-05 – 2024-11-06 (×3): 50 mg via ORAL
  Filled 2024-11-05 (×3): qty 2

## 2024-11-05 MED ORDER — ACETAMINOPHEN 325 MG PO TABS: 650.0000 mg | ORAL_TABLET | Freq: Four times a day (QID) | ORAL | Status: DC | PRN

## 2024-11-05 MED ORDER — SODIUM CHLORIDE 0.9 % IV SOLN: INTRAVENOUS | Status: AC

## 2024-11-05 MED ORDER — MELATONIN 5 MG PO TABS: 5.0000 mg | ORAL_TABLET | Freq: Every evening | ORAL | Status: DC | PRN

## 2024-11-05 MED ORDER — SENNOSIDES-DOCUSATE SODIUM 8.6-50 MG PO TABS
2.0000 | ORAL_TABLET | Freq: Two times a day (BID) | ORAL | Status: DC
  Administered 2024-11-05 (×2): 2 via ORAL
  Filled 2024-11-05 (×3): qty 2

## 2024-11-05 MED ORDER — HYDROMORPHONE HCL 2 MG PO TABS
4.0000 mg | ORAL_TABLET | Freq: Four times a day (QID) | ORAL | Status: DC | PRN
  Administered 2024-11-05 – 2024-11-06 (×4): 4 mg via ORAL
  Filled 2024-11-05 (×4): qty 2

## 2024-11-05 MED ORDER — OXYCODONE HCL 5 MG PO TABS
10.0000 mg | ORAL_TABLET | ORAL | Status: AC
  Administered 2024-11-05: 10 mg via ORAL
  Filled 2024-11-05: qty 2

## 2024-11-05 NOTE — ED Notes (Signed)
 Pt transported to echo

## 2024-11-05 NOTE — Plan of Care (Signed)

## 2024-11-05 NOTE — ED Notes (Signed)
 Pt transported to Echo.

## 2024-11-05 NOTE — Progress Notes (Signed)
 " PROGRESS NOTE  Brett White FMW:[id number] DOB: July 07, 1961   PCP: [facility]  Patient is from: SNF, [facility]  DOA: 11/04/2024 LOS: 0  Chief complaints No chief complaint on file.    Brief Narrative / Interim history: 64 year old M with PMH of CVA, DM-2, HTN, HLD, BPH, constipation, chronic pain on opiate and left BKA presented to ED with recurrent syncopal episodes.  Patient was at the [facility] today getting signed up for the insurance.  When he stood up from the seat, he passed out.  Shortly after, he regained consciousness.  Somebody helped him sit up in the chair.  He said for few minutes and then when he stood up, he had another episode of passing out.  He rested at the [facility] for a little.  He was then drove back to his facility.  Similar thing happened at his facility.   He denies having any prodromal symptoms prior to episode.   In ED, stable vitals. Cr 1.94 (baseline 1.1).  Lactic acid 3.3 but resolved quickly.  Troponin 31 and 23.  EKG without acute finding.  CT head, cervical spine, lumbar spine, chest, abdomen and pelvis without significant finding to explain patient's presentation.  Some signs of hemoconcentration on CBC.  Patient was started on IV fluid and admitted for syncope, AKI and dehydration.  TTE ordered.    Subjective: Seen and examined earlier this afternoon.  Patient complains of right-sided back pain after his falls.  Denies new focal neurosymptoms in his legs.  Denies chest pain, dizziness, shortness of breath, palpitation, GI or UTI symptoms.  Denies smoking cigarettes, drinking alcohol  recreational drug use.   Assessment and plan: Recurrent syncope/orthostasis: Had 3 syncopal episodes before coming to the hospital.  Denies new medications.  Some signs of dehydration as evidenced by hemoconcentration and AKI.  CT head, cervical spine, chest, abdomen and pelvis without significant finding.  Low suspicion for infection.  TTE without significant  finding.  He is at risk for polypharmacy on multiple sedating medications. - Continue IV fluid for AKI and dehydration. - Check orthostatic vital, PT and OT eval. -If orthostatic vitals negative, will consult cardiology for heart monitor -Minimize sedating medications. -Check UDS.    Dehydration/AKI: b/l Cr ~1.0.  No obstruction on CT abdomen and pelvis. Recent Labs    04/25/24 0248 04/26/24 0501 04/27/24 0536 06/16/24 1315 09/05/24 1850 09/05/24 1857 09/06/24 0548 09/07/24 0540 09/08/24 0557 09/09/24 0528 11/04/24 1754 11/05/24 0331  BUN 20 19  --   --  22 23 19 16 13 15  26* 23  CREATININE 1.21 1.11 1.10 1.40* 1.97* 2.20* 1.59* 1.09 1.02 0.99 1.94* 1.51*  - Continue IV fluid - Check CK - Continue holding Lasix  and Farxiga    IDDM-2 with hyperglycemia: A1c 8.4%.  Seems to be on Farxiga , Trulicity  and sliding scale insulin  Recent Labs  Lab 11/05/24 0012 11/05/24 0812 11/05/24 1255  GLUCAP 152* 201* 176*  - Continue SSI-moderate -On Lantus  6 units daily -Hold home Farxiga   Right back pain: Likely due to fall.  Exam with tenderness over thoracic and lumbar paraspinal muscles but no tenderness over spinous process.  Known history of chronic back pain. -Resume home p.o. Dilaudid  and Lyrica  -Scheduled Tylenol  around-the-clock -Check CK   History of CVA:  - Continue home aspirin .   Constipation:  -Resume home medications. -Schedule Senokot twice daily   BPH:  - Continue home Flomax    GERD: - Continue on PPI  Left BKA: Uses prosthesis.   Depression: Stable -Continue  home Cymbalta   -Discontinue Zoloft .  I do not think he should be on Cymbalta  and Zoloft  at the same time.   Class I obesity Body mass index is 32.83 kg/m.           DVT prophylaxis:  heparin  injection 5,000 Units Start: 11/04/24 2200  Code Status: DNR-confirmed with patient Family Communication: None at bedside Level of care: Telemetry Status is: Observation The patient will require  care spanning > 2 midnights and should be moved to inpatient because: Syncopal episode, AKI, dehydration   Final disposition: Back to SNF   55 minutes with more than 50% spent in reviewing records, counseling patient/family and coordinating care.  Consultants:  None  Procedures: None  Microbiology summarized: None  Objective: Vitals:   11/04/24 1653 11/04/24 2045 11/05/24 0313 11/05/24 0820  BP: 133/69 (!) 156/89 135/78 136/77  Pulse: (!) 102 88 88 87  Resp: 18 16 19 17   Temp: 98.3 F (36.8 C) 98 F (36.7 C) 98.7 F (37.1 C) 98.3 F (36.8 C)  TempSrc: Oral Oral Oral   SpO2: 99% 99% 93% 92%  Weight:   116 kg   Height:   6' 2 (1.88 m)     Examination:  GENERAL: No apparent distress.  Nontoxic. HEENT: MMM.  Vision and hearing grossly intact.  NECK: Supple.  No apparent JVD.  RESP:  No IWOB.  Fair aeration bilaterally. CVS:  RRR. Heart sounds normal.  ABD/GI/GU: BS+. Abd soft, NTND.  MSK/EXT:  Moves extremities.  Left BKA.  Tenderness over right thoracic and lumbar paraspinous muscles.  No tenderness over spinous process. SKIN: no apparent skin lesion or wound NEURO: AA.  Oriented appropriately.  No apparent focal neuro deficit. PSYCH: Calm. Normal affect.   Sch Meds:  Scheduled Meds:  artificial tears  1 drop Left Eye QID   aspirin  EC  81 mg Oral Daily   atorvastatin   40 mg Oral QHS   brimonidine   1 drop Left Eye BID   DULoxetine   60 mg Oral Daily   famotidine   20 mg Oral q AM   heparin   5,000 Units Subcutaneous Q8H   insulin  aspart  0-15 Units Subcutaneous TID WC   insulin  aspart  0-5 Units Subcutaneous QHS   linaclotide   145 mcg Oral q AM   melatonin  10 mg Oral QHS   pantoprazole   40 mg Oral BID AC   polyethylene glycol  17 g Oral Daily   pregabalin   50 mg Oral BID   senna-docusate  2 tablet Oral BID   sertraline   50 mg Oral Daily   tamsulosin   0.4 mg Oral QHS   Continuous Infusions: PRN Meds:.acetaminophen , alum & mag hydroxide-simeth,  HYDROmorphone , ipratropium-albuterol , melatonin, ondansetron  **OR** ondansetron  (ZOFRAN ) IV, polyethylene glycol  Antimicrobials: Anti-infectives (From admission, onward)    None        I have personally reviewed the following labs and images: CBC: Recent Labs  Lab 11/04/24 1754 11/05/24 0331  WBC 10.1 7.2  NEUTROABS 8.4*  --   HGB 13.2 11.8*  HCT 42.3 37.6*  MCV 79.2* 79.8*  PLT 278 234   BMP &GFR Recent Labs  Lab 11/04/24 1754 11/05/24 0331  NA 136 139  K 4.3 4.1  CL 96* 104  CO2 23 26  GLUCOSE 169* 196*  BUN 26* 23  CREATININE 1.94* 1.51*  CALCIUM  9.6 8.2*  MG 2.0  --    Estimated Creatinine Clearance: 69.5 mL/min (A) (by C-G formula based on SCr of 1.51 mg/dL (H)).  Liver & Pancreas: Recent Labs  Lab 11/04/24 1754  AST 44*  ALT 33  ALKPHOS 146*  BILITOT 0.5  PROT 7.9  ALBUMIN  4.4   No results for input(s): LIPASE, AMYLASE in the last 168 hours. No results for input(s): AMMONIA in the last 168 hours. Diabetic: Recent Labs    11/05/24 0331  HGBA1C 8.4*   Recent Labs  Lab 11/05/24 0012 11/05/24 0812 11/05/24 1255  GLUCAP 152* 201* 176*   Cardiac Enzymes: No results for input(s): CKTOTAL, CKMB, CKMBINDEX, TROPONINI in the last 168 hours. No results for input(s): PROBNP in the last 8760 hours. Coagulation Profile: No results for input(s): INR, PROTIME in the last 168 hours. Thyroid Function Tests: Recent Labs    11/04/24 1755  TSH 0.901   Lipid Profile: No results for input(s): CHOL, HDL, LDLCALC, TRIG, CHOLHDL, LDLDIRECT in the last 72 hours. Anemia Panel: No results for input(s): VITAMINB12, FOLATE, FERRITIN, TIBC, IRON, RETICCTPCT in the last 72 hours. Urine analysis:    Component Value Date/Time   COLORURINE STRAW (A) 09/07/2024 1017   APPEARANCEUR CLEAR 09/07/2024 1017   LABSPEC 1.013 09/07/2024 1017   PHURINE 5.0 09/07/2024 1017   GLUCOSEU >=500 (A) 09/07/2024 1017   HGBUR NEGATIVE  09/07/2024 1017   BILIRUBINUR NEGATIVE 09/07/2024 1017   KETONESUR NEGATIVE 09/07/2024 1017   PROTEINUR NEGATIVE 09/07/2024 1017   NITRITE NEGATIVE 09/07/2024 1017   LEUKOCYTESUR NEGATIVE 09/07/2024 1017   Sepsis Labs: Invalid input(s): PROCALCITONIN, LACTICIDVEN  Microbiology: No results found for this or any previous visit (from the past 240 hours).  Radiology Studies: CT L-SPINE NO CHARGE Result Date: 11/04/2024 EXAM: CT OF THE LUMBAR SPINE WITHOUT CONTRAST 11/04/2024 09:15:00 PM TECHNIQUE: CT of the lumbar spine was performed without the administration of intravenous contrast. Multiplanar reformatted images are provided for review. Automated exposure control, iterative reconstruction, and/or weight based adjustment of the mA/kV was utilized to reduce the radiation dose to as low as reasonably achievable. COMPARISON: None available. CLINICAL HISTORY: FALL WITH BACK PAIN FINDINGS: BONES AND ALIGNMENT: Interbody spacers are present at L3 - L4 and L4 - L5. Normal vertebral body heights. No acute fracture or traumatic malalignment. Normal alignment. DEGENERATIVE CHANGES: Mild multilevel spondylosis. Moderate multilevel facet arthropathy. No severe spinal canal narrowing. SOFT TISSUES: Findings in the abdomen and pelvis reported separately on same day CT abdomen and pelvis. IMPRESSION: 1. No acute fracture or traumatic malalignment. Electronically signed by: Norman Gatlin MD 11/04/2024 09:55 PM EST RP Workstation: [id number]   CT CHEST ABDOMEN PELVIS W CONTRAST Result Date: 11/04/2024 CLINICAL DATA:  Syncope, back pain EXAM: CT CHEST, ABDOMEN, AND PELVIS WITH CONTRAST TECHNIQUE: Multidetector CT imaging of the chest, abdomen and pelvis was performed following the standard protocol during bolus administration of intravenous contrast. RADIATION DOSE REDUCTION: This exam was performed according to the departmental dose-optimization program which includes automated exposure control, adjustment of the  mA and/or kV according to patient size and/or use of iterative reconstruction technique. CONTRAST:  75mL OMNIPAQUE  IOHEXOL  350 MG/ML SOLN COMPARISON:  09/05/2024 FINDINGS: CT CHEST FINDINGS Cardiovascular: The heart is unremarkable without pericardial effusion. No evidence of acute vascular injury. No evidence of thoracic aortic aneurysm or dissection. Atherosclerosis of the aorta and coronary vasculature. Mediastinum/Nodes: No enlarged mediastinal, hilar, or axillary lymph nodes. Thyroid gland, trachea, and esophagus demonstrate no significant findings. Lungs/Pleura: Scattered areas of subsegmental atelectasis are seen at the lung bases. No acute airspace disease, effusion, or pneumothorax. The central airways are patent. Musculoskeletal: There are no acute displaced fractures. Reconstructed  images demonstrate no additional findings. CT ABDOMEN PELVIS FINDINGS Hepatobiliary: No hepatic injury or perihepatic hematoma. Gallbladder is unremarkable. Pancreas: Unremarkable. No pancreatic ductal dilatation or surrounding inflammatory changes. Spleen: No splenic injury or perisplenic hematoma. Adrenals/Urinary Tract: No evidence of acute renal injury. Multiple nonobstructing right renal calculi are again noted, largest measuring 11 mm. Stable indeterminate 12 mm hypodense lesion off the lower pole right kidney reference image 87/3. The adrenals and bladder are unremarkable. Stomach/Bowel: No bowel obstruction or ileus. No bowel wall thickening or inflammatory change. Vascular/Lymphatic: Aortic atherosclerosis. No enlarged abdominal or pelvic lymph nodes. Reproductive: Prostate is unremarkable. Other: No free fluid or free intraperitoneal gas. Small fat containing umbilical hernia. No bowel herniation. Musculoskeletal: No acute or destructive bony abnormalities. Stable postsurgical changes within the lower lumbar spine. Reconstructed images demonstrate no additional findings. IMPRESSION: 1. No acute intrathoracic,  intra-abdominal, or intrapelvic trauma. 2. Stable indeterminate 12 mm right renal lesion. Nonemergent outpatient dedicated renal CT or MRI with and without contrast is again recommended. 3. Stable nonobstructing right renal calculi. 4.  Aortic Atherosclerosis (ICD10-I70.0). Electronically Signed   By: Ozell Daring M.D.   On: 11/04/2024 20:01   CT Cervical Spine Wo Contrast Result Date: 11/04/2024 CLINICAL DATA:  Clemens, loss of consciousness EXAM: CT CERVICAL SPINE WITHOUT CONTRAST TECHNIQUE: Multidetector CT imaging of the cervical spine was performed without intravenous contrast. Multiplanar CT image reconstructions were also generated. RADIATION DOSE REDUCTION: This exam was performed according to the departmental dose-optimization program which includes automated exposure control, adjustment of the mA and/or kV according to patient size and/or use of iterative reconstruction technique. COMPARISON:  09/05/2024 FINDINGS: Alignment: Stable straightening of the cervical spine due to multilevel degenerative changes. Skull base and vertebrae: No acute fracture. No primary bone lesion or focal pathologic process. Soft tissues and spinal canal: No prevertebral fluid or swelling. No visible canal hematoma. Asymmetric soft tissue prominence at the left base of tongue is again noted, incompletely characterized on this unenhanced exam. Disc levels: Stable multilevel spondylosis and facet hypertrophy, with spondylosis greatest at C3-4 and C5-6. Bony fusion across the C6-7 level. Upper chest: Airway is patent.  Lung apices are clear. Other: Reconstructed images demonstrate no additional findings. IMPRESSION: 1. No acute cervical spine fracture. 2. Stable multilevel cervical degenerative changes. 3. Stable asymmetric soft tissue prominence at the left base of tongue. Underlying infiltrative mass cannot be excluded on this unenhanced exam, and correlation with direct visual inspection is recommended. Electronically Signed    By: Ozell Daring M.D.   On: 11/04/2024 19:55   CT Head Wo Contrast Result Date: 11/04/2024 CLINICAL DATA:  Clemens, loss of consciousness EXAM: CT HEAD WITHOUT CONTRAST TECHNIQUE: Contiguous axial images were obtained from the base of the skull through the vertex without intravenous contrast. RADIATION DOSE REDUCTION: This exam was performed according to the departmental dose-optimization program which includes automated exposure control, adjustment of the mA and/or kV according to patient size and/or use of iterative reconstruction technique. COMPARISON:  09/05/2024 FINDINGS: Brain: Stable chronic small-vessel ischemic changes within the basal ganglia. No evidence of acute infarct or hemorrhage. The lateral ventricles and remaining midline structures are unremarkable. No acute extra-axial fluid collections. No mass effect. Vascular: No hyperdense vessel or unexpected calcification. Skull: Normal. Negative for fracture or focal lesion. Sinuses/Orbits: Stable postsurgical changes within the left globe. No acute abnormality. Other: None. IMPRESSION: 1. No acute intracranial process. Electronically Signed   By: Ozell Daring M.D.   On: 11/04/2024 19:51      Allon Costlow  [facility]  If 7PM-7AM, please contact night-coverage [email or web address] 11/05/2024, 1:10 PM   "

## 2024-11-05 NOTE — ED Notes (Signed)
 Pt placed onto hospital bed for comfort.

## 2024-11-05 NOTE — Care Management Obs Status (Signed)
 MEDICARE OBSERVATION STATUS NOTIFICATION   Patient Details  Name: Brett White MRN: [id number] Date of Birth: 12-Sep-1961   Medicare Observation Status Notification Given:  Yes    Merilee LOISE Batty, RN 11/05/2024, 3:41 PM

## 2024-11-05 NOTE — Progress Notes (Signed)
 OT Cancellation Note  Patient Details Name: Brett White MRN: [id number] DOB: Feb 20, 1961   Cancelled Treatment:    Reason Eval/Treat Not Completed: OT screened, no needs identified, will sign off (Pt delcining rehab services. OT will sign off. Please re-order if needed.)  Lynne Righi D Causey 11/05/2024, 12:29 PM

## 2024-11-05 NOTE — Progress Notes (Signed)
" °   11/05/24 1541  TOC Brief Assessment  Insurance and Status Reviewed  Patient has primary care physician Yes  Home environment has been reviewed [facility] SNF - patient is LTC  Prior level of function: Assisted as needed by facility staff   Met with patient at bedside. Patient resides at [facility]. He is LTC. Message sent via Epic to SNF to confirm. Patient is mostly independent, staff assists as needed. Patient has a prosthetic, FWW and w/c for mobility.   No current CM needs reported at this time. Plan for patient to d/c back to SNF when medically appropriate.   Merilee Batty, MSN, RN Case Management [phone]   "

## 2024-11-05 NOTE — Progress Notes (Signed)
 PT Cancellation Note  Patient Details Name: Brett White MRN: [id number] DOB: [birth year]   Cancelled Treatment:    Reason Eval/Treat Not Completed: Patient declined, no reason specified (Pt reports he is moving okay he just has pain and declines physical therapy at this time. pt appears to be at baseline and wants to return to long term SNF on discharge. Will discharge at this time. Please re-consult if further needs arise.)  Dorothyann Maier, DPT, CLT  Acute Rehabilitation Services Office: [phone] (Secure chat preferred)   Dorothyann VEAR Maier 11/05/2024, 1:34 PM

## 2024-11-06 ENCOUNTER — Observation Stay

## 2024-11-06 ENCOUNTER — Telehealth: Payer: Self-pay | Admitting: Cardiology

## 2024-11-06 DIAGNOSIS — R55 Syncope and collapse: Secondary | ICD-10-CM

## 2024-11-06 LAB — CBC
HCT: 39.2 % (ref 39.0–52.0)
Hemoglobin: 12.2 g/dL — ABNORMAL LOW (ref 13.0–17.0)
MCH: 25 pg — ABNORMAL LOW (ref 26.0–34.0)
MCHC: 31.1 g/dL (ref 30.0–36.0)
MCV: 80.3 fL (ref 80.0–100.0)
Platelets: 249 10*3/uL (ref 150–400)
RBC: 4.88 MIL/uL (ref 4.22–5.81)
RDW: 17.2 % — ABNORMAL HIGH (ref 11.5–15.5)
WBC: 6.3 10*3/uL (ref 4.0–10.5)
nRBC: 0 % (ref 0.0–0.2)

## 2024-11-06 LAB — COMPREHENSIVE METABOLIC PANEL WITH GFR
ALT: 20 U/L (ref 0–44)
AST: 35 U/L (ref 15–41)
Albumin: 3.9 g/dL (ref 3.5–5.0)
Alkaline Phosphatase: 128 U/L — ABNORMAL HIGH (ref 38–126)
Anion gap: 13 (ref 5–15)
BUN: 15 mg/dL (ref 8–23)
CO2: 24 mmol/L (ref 22–32)
Calcium: 8.8 mg/dL — ABNORMAL LOW (ref 8.9–10.3)
Chloride: 101 mmol/L (ref 98–111)
Creatinine, Ser: 1.16 mg/dL (ref 0.61–1.24)
GFR, Estimated: >60 mL/min
Glucose, Bld: 141 mg/dL — ABNORMAL HIGH (ref 70–99)
Potassium: 4.1 mmol/L (ref 3.5–5.1)
Sodium: 138 mmol/L (ref 135–145)
Total Bilirubin: 0.4 mg/dL (ref 0.0–1.2)
Total Protein: 6.9 g/dL (ref 6.5–8.1)

## 2024-11-06 LAB — PHOSPHORUS: Phosphorus: 2.6 mg/dL (ref 2.5–4.6)

## 2024-11-06 LAB — GLUCOSE, CAPILLARY
Glucose-Capillary: 117 mg/dL — ABNORMAL HIGH (ref 70–99)
Glucose-Capillary: 192 mg/dL — ABNORMAL HIGH (ref 70–99)

## 2024-11-06 LAB — CK: Total CK: 593 U/L — ABNORMAL HIGH (ref 49–397)

## 2024-11-06 LAB — MAGNESIUM: Magnesium: 1.9 mg/dL (ref 1.7–2.4)

## 2024-11-06 MED ORDER — METOCLOPRAMIDE HCL 5 MG/ML IJ SOLN
5.0000 mg | Freq: Three times a day (TID) | INTRAMUSCULAR | Status: DC
  Administered 2024-11-06: 5 mg via INTRAVENOUS
  Filled 2024-11-06: qty 2

## 2024-11-06 MED ORDER — METOCLOPRAMIDE HCL 5 MG PO TABS: 5.0000 mg | ORAL_TABLET | Freq: Three times a day (TID) | ORAL | Status: AC

## 2024-11-06 NOTE — Progress Notes (Unsigned)
Enrolled patient for a 14 day Zio XT monitor to be mailed to patients home  [place] to read

## 2024-11-06 NOTE — Addendum Note (Signed)
 Addended byBETHA DARRYLE CURRIER on: 11/06/2024 01:07 PM   Modules accepted: Orders

## 2024-11-06 NOTE — Telephone Encounter (Addendum)
 Please order a 2-week heart monitor for syncope in the setting of dehydration and AKI per primary team.  Dr. Verlin to read.   Scheduling, can you please arrange follow up around 6-8 weeks.

## 2024-11-06 NOTE — Progress Notes (Signed)
 Attempted to give report at [phone] nurse unavailable

## 2024-11-06 NOTE — Progress Notes (Signed)
 DISCHARGE NOTE SNF Brett White to be discharged [facility] nursing [facility]  per MD order. Patient verbalized understanding.  Skin clean, dry and intact without evidence of skin break down, no evidence of skin tears noted. IV catheter discontinued intact. Site without signs and symptoms of complications. Dressing and pressure applied. Pt denies pain at the site currently. No complaints noted.  Patient free of lines, drains, and wounds.   Discharge packet assembled. An After Visit Summary (AVS) was printed and given to the EMS personnel. Patient escorted via stretcher and discharged to [facility] via ambulance. Report attempted to accepting facility; no answer   Peyton SHAUNNA Pepper, RN

## 2024-11-06 NOTE — Discharge Summary (Addendum)
 "  Physician Discharge Summary  Brett White FMW:[id number] DOB: [birth year] DOA: 11/04/2024  PCP: [facility]  Admit date: 11/04/2024 Discharge date: 11/06/24  Admitted [facility]: [facility] Disposition: [facility] Recommendations for Outpatient Follow-up:   Check CMP and CBC in 1 week Cardiology to arrange outpatient heart monitor for recurrent syncope. Patient at risk for polypharmacy.  Reassess medications and adjust as appropriate Please follow up on the following pending results: None   Discharge Condition: Stable CODE STATUS: DNR    Hospital course 64 year old M with PMH of CVA, DM-2, HTN, HLD, BPH, constipation, chronic pain on opiate and left BKA presented to ED with recurrent syncopal episodes.   Patient was at the [facility] today getting signed up for the insurance.  When he stood up from the seat, he passed out.  Shortly after, he regained consciousness.  Somebody helped him sit up in the chair.  He said for few minutes and then when he stood up, he had another episode of passing out.  He rested at the [facility] for a little.  He was then drove back to his facility.  Similar thing happened at his facility.   He denies having any prodromal symptoms prior to episode.    In ED, stable vitals. Cr 1.94 (baseline 1.1).  Lactic acid 3.3 but resolved quickly.  Troponin 31 and 23.  EKG without acute finding.  CT head, cervical spine, lumbar spine, chest, abdomen and pelvis without significant finding to explain patient's presentation.  Some signs of hemoconcentration on CBC.  Patient was started on IV fluid and admitted for syncope, AKI and dehydration.  TTE ordered.  TTE with LVEF of 60 to 65%, G1 DD.  CK slightly elevated to 578.  UDS positive for fentanyl  and opiate.  Patient had fentanyl  and an opiate in ED.  On the day of discharge, AKI resolved.  We discontinued Lasix  due to risk of dehydration.  Cardiology to arrange outpatient heart monitor to evaluate recurrent  syncope.  See individual problem list below for more.   Problems addressed during this hospitalization Recurrent syncope/orthostasis: Had 3 syncopal episodes before coming to the hospital.  Denies new medications.  Some signs of dehydration as evidenced by hemoconcentration and AKI.  CT head, cervical spine, chest, abdomen and pelvis without significant finding.  Low suspicion for infection.  TTE without significant finding.  He is at risk for polypharmacy on multiple sedating medications.  Dehydration and AKI resolved. -Discontinue Lasix  due to risk for dehydration - Cardiology to arrange outpatient heart monitor.   Dehydration/AKI: b/l Cr ~1.0.  No obstruction on CT abdomen and pelvis.  CK slightly elevated.  AKI resolved. -Discontinued Lasix  -Recheck renal function in 1 week     IDDM-2 with hyperglycemia: A1c 8.4%.  Seems to be on Farxiga , Trulicity  and sliding scale insulin  -Continue home Farxiga , Trulicity  and insulin .   Right back pain: Likely due to fall.  Exam with tenderness over thoracic and lumbar paraspinal muscles but no tenderness over spinous process.  Known history of chronic back pain. - Continue home Tylenol , Lyrica , Dilaudid  and  Nausea and vomiting: Suspect gastroparesis.  Abdominal exam benign. - Started Reglan   Mild rhabdomyolysis: CK slightly elevated.  Could be traumatic. - Hold statin.  History of CVA:  - Continue home aspirin .   Constipation: Resolved. - Continue bowel regimen   BPH:  - Continue home Flomax    GERD: - Continue on PPI   Left BKA: Uses prosthesis.   Depression: Stable -Continue home Cymbalta   -Discontinue Zoloft .  I do not think he should be on Cymbalta  and Zoloft  at the same time.  Body mass index is 32.83 kg/m.           Consultations: Cardiology for outpatient heart monitor  Time spent 35  minutes  Vital signs Vitals:   11/05/24 2031 11/06/24 0131 11/06/24 0453 11/06/24 0818  BP: 137/78 (!) 163/89 (!) 145/76 (!)  157/83  Pulse: 88 81 87 94  Temp: 98.3 F (36.8 C) 97.8 F (36.6 C) 99 F (37.2 C) 98 F (36.7 C)  Resp: 19 19 20 18   Height:      Weight:      SpO2: 93% 91% 94% 93%  TempSrc: Oral Oral    BMI (Calculated):         Discharge exam  GENERAL: No apparent distress.  Nontoxic. HEENT: MMM.  Vision and hearing grossly intact.  NECK: Supple.  No apparent JVD.  RESP:  No IWOB.  Fair aeration bilaterally. CVS:  RRR. Heart sounds normal.  ABD/GI/GU: BS+. Abd soft, NTND.  MSK/EXT:  Moves extremities.  Left BKA.  Some tenderness over right thoracic and lumbar paraspinous muscles.  No tenderness over spinous process. SKIN: no apparent skin lesion or wound NEURO: AA.  Oriented appropriately.  No apparent focal neuro deficit. PSYCH: Calm. Normal affect.   Discharge Instructions Discharge Instructions     Increase activity slowly   Complete by: As directed       Allergies as of 11/06/2024   No Known Allergies      Medication List     PAUSE taking these medications    atorvastatin  40 MG tablet Wait to take this until: November 13, 2024 Commonly known as: LIPITOR Take 40 mg by mouth at bedtime.       STOP taking these medications    furosemide  20 MG tablet Commonly known as: LASIX    sertraline  50 MG tablet Commonly known as: ZOLOFT        TAKE these medications    acetaminophen  325 MG tablet Commonly known as: TYLENOL  Take 2 tablets (650 mg total) by mouth every 6 (six) hours as needed for mild pain (pain score 1-3), fever or headache.   alum & mag hydroxide-simeth 200-200-20 MG/5ML suspension Commonly known as: MAALOX/MYLANTA Take 15 mLs by mouth every 4 (four) hours as needed for indigestion or heartburn.   amLODipine  10 MG tablet Commonly known as: NORVASC  Take 10 mg by mouth every morning.   artificial tears ophthalmic solution Place 1 drop into the left eye 4 (four) times daily.   aspirin  EC 81 MG tablet Take 1 tablet (81 mg total) by mouth daily.  Swallow whole.   BABY SHAMPOO EX Place 1 Application into both eyes in the morning, at noon, and at bedtime. 1 application in both eyes every shift for eye irritation; dilute and use to cleanse with warm compress to both eyes.   Bisacodyl  Laxative 10 MG suppository Generic drug: bisacodyl  Place 10 mg rectally daily as needed for moderate constipation.   brimonidine  0.2 % ophthalmic solution Commonly known as: ALPHAGAN  Place 1 drop into the left eye in the morning and at bedtime.   carboxymethylcellulose 1 % ophthalmic solution Place 4 drops into the left eye in the morning, at noon, in the evening, and at bedtime.   DULoxetine  60 MG capsule Commonly known as: CYMBALTA  Take 60 mg by mouth in the morning.   ergocalciferol  1.25 MG (50000 UT) capsule Commonly known as: VITAMIN D2 Take 50,000 Units by mouth once  a week. Fridays   famotidine  20 MG tablet Commonly known as: PEPCID  Take 20 mg by mouth in the morning.   Farxiga  10 MG Tabs tablet Generic drug: dapagliflozin  propanediol Take 10 mg by mouth every morning. Dapaglifozin   HumaLOG KwikPen 100 UNIT/ML KwikPen Generic drug: insulin  lispro Inject 2 Units into the skin 3 (three) times daily as needed (per sliding scale). Inject per sliding scale: If 201-250 = 2 units; if 251-300 = 4 units; if 301-350 = 6 units; if 351-400 = 8 units; for greater than 401, notify provider.   HYDROmorphone  4 MG tablet Commonly known as: DILAUDID  Take 4 mg by mouth every 6 (six) hours as needed for severe pain (pain score 7-10).   ipratropium-albuterol  0.5-2.5 (3) MG/3ML Soln Commonly known as: DUONEB Take 3 mLs by nebulization every 6 (six) hours as needed.   Linzess  145 MCG Caps capsule Generic drug: linaclotide  Take 145 mcg by mouth in the morning.   Melatonin 5 MG Caps Take 10 mg by mouth at bedtime.   metoCLOPramide  5 MG tablet Commonly known as: Reglan  Take 1 tablet (5 mg total) by mouth 4 (four) times daily -  before meals and at  bedtime.   MILK OF MAGNESIA PO Take 30 mLs by mouth as needed (for constipation).   ondansetron  4 MG tablet Commonly known as: ZOFRAN  Take 4 mg by mouth every 8 (eight) hours as needed.   OVER THE COUNTER MEDICATION Place 1 enema rectally daily as needed (constipation, if no relief from suppository). **Sodium Phosphate  Enema**   pantoprazole  40 MG tablet Commonly known as: PROTONIX  Take 1 tablet (40 mg total) by mouth 2 (two) times daily before a meal.   polyethylene glycol 17 g packet Commonly known as: MIRALAX  / GLYCOLAX  Take 17 g by mouth daily.   pregabalin  100 MG capsule Commonly known as: LYRICA  Take 1 capsule (100 mg total) by mouth 2 (two) times daily.   senna-docusate 8.6-50 MG tablet Commonly known as: Senokot-S Take 1 tablet by mouth 2 (two) times daily.   tamsulosin  0.4 MG Caps capsule Commonly known as: FLOMAX  Take 0.4 mg by mouth at bedtime.   traZODone  100 MG tablet Commonly known as: DESYREL  Take 100 mg by mouth at bedtime.   Trulicity  1.5 MG/0.5ML Soaj Generic drug: Dulaglutide  Inject 0.5 mg into the skin once a week. On Thursdays         Procedures/Studies:   ECHOCARDIOGRAM COMPLETE Result Date: 11/05/2024    ECHOCARDIOGRAM REPORT   Patient Name:   Brett White Date of Exam: 11/05/2024 Medical Rec #:  [id number]      Height:       74.0 in Accession #:    [id number]     Weight:       255.7 lb Date of Birth:  05-Feb-1961      BSA:          2.413 m Patient Age:    63 years       BP:           136/77 mmHg Patient Gender: M              HR:           90 bpm. Exam Location:  Inpatient Procedure: 2D Echo, Cardiac Doppler and Color Doppler (Both Spectral and Color            Flow Doppler were utilized during procedure). Indications:    R55 Syncope  History:  Patient has prior history of Echocardiogram examinations, most                 recent 04/16/2024. Risk Factors:Hypertension, Diabetes and                 Dyslipidemia.  Sonographer:    Reagann Love  Referring Phys: [id number] RAVI PAHWANI IMPRESSIONS  1. Left ventricular ejection fraction, by estimation, is 60 to 65%. Left ventricular ejection fraction by PLAX is 60 %. The left ventricle has normal function. The left ventricle has no regional wall motion abnormalities. There is mild left ventricular hypertrophy. Left ventricular diastolic parameters are consistent with Grade I diastolic dysfunction (impaired relaxation).  2. Right ventricular systolic function is normal. The right ventricular size is normal.  3. The mitral valve is grossly normal. Trivial mitral valve regurgitation.  4. The aortic valve is tricuspid. Aortic valve regurgitation is not visualized. No aortic stenosis is present.  5. Aortic dilatation noted. There is mild dilatation of the aortic root, measuring 44 mm. There is borderline dilatation of the ascending aorta, measuring 37 mm.  6. The inferior vena cava is normal in size with greater than 50% respiratory variability, suggesting right atrial pressure of 3 mmHg. Comparison(s): Changes from prior study are noted. 04/16/2024: LVEF 55-60%. FINDINGS  Left Ventricle: Left ventricular ejection fraction, by estimation, is 60 to 65%. Left ventricular ejection fraction by PLAX is 60 %. The left ventricle has normal function. The left ventricle has no regional wall motion abnormalities. The left ventricular internal cavity size was normal in size. There is mild left ventricular hypertrophy. Left ventricular diastolic parameters are consistent with Grade I diastolic dysfunction (impaired relaxation). Indeterminate filling pressures. Right Ventricle: The right ventricular size is normal. No increase in right ventricular wall thickness. Right ventricular systolic function is normal. Left Atrium: Left atrial size was normal in size. Right Atrium: Right atrial size was normal in size. Pericardium: There is no evidence of pericardial effusion. Mitral Valve: The mitral valve is grossly normal. Trivial mitral  valve regurgitation. Tricuspid Valve: The tricuspid valve is normal in structure. Tricuspid valve regurgitation is not demonstrated. Aortic Valve: The aortic valve is tricuspid. Aortic valve regurgitation is not visualized. No aortic stenosis is present. Pulmonic Valve: The pulmonic valve was normal in structure. Pulmonic valve regurgitation is not visualized. Aorta: Aortic dilatation noted. There is mild dilatation of the aortic root, measuring 44 mm. There is borderline dilatation of the ascending aorta, measuring 37 mm. Venous: The inferior vena cava is normal in size with greater than 50% respiratory variability, suggesting right atrial pressure of 3 mmHg. IAS/Shunts: No atrial level shunt detected by color flow Doppler.  LEFT VENTRICLE PLAX 2D LV EF:         Left            Diastology                ventricular     LV e' medial:    9.89 cm/s                ejection        LV E/e' medial:  10.0                fraction by     LV e' lateral:   8.11 cm/s                PLAX is 60      LV E/e' lateral: 12.2                %.  LVIDd:         5.00 cm LVIDs:         3.40 cm LV PW:         1.00 cm LV IVS:        1.00 cm LVOT diam:     2.30 cm LV SV:         90 LV SV Index:   37 LVOT Area:     4.15 cm  RIGHT VENTRICLE RV Basal diam:  3.30 cm RV Mid diam:    2.30 cm RV S prime:     14.80 cm/s TAPSE (M-mode): 2.5 cm LEFT ATRIUM             Index        RIGHT ATRIUM           Index LA diam:        4.00 cm 1.66 cm/m   RA Area:     10.80 cm LA Vol (A2C):   31.5 ml 13.06 ml/m  RA Volume:   19.00 ml  7.87 ml/m LA Vol (A4C):   61.2 ml 25.36 ml/m LA Biplane Vol: 48.4 ml 20.06 ml/m  AORTIC VALVE LVOT Vmax:   112.00 cm/s LVOT Vmean:  72.700 cm/s LVOT VTI:    0.217 m  AORTA Ao Root diam: 4.40 cm Ao Asc diam:  3.70 cm MITRAL VALVE MV Area (PHT): 3.75 cm     SHUNTS MV Decel Time: 203 msec     Systemic VTI:  0.22 m MV E velocity: 99.15 cm/s   Systemic Diam: 2.30 cm MV A velocity: 121.50 cm/s MV E/A ratio:  0.82 Vinie Maxcy MD  Electronically signed by Vinie Maxcy MD Signature Date/Time: 11/05/2024/2:12:31 PM    Final    CT L-SPINE NO CHARGE Result Date: 11/04/2024 EXAM: CT OF THE LUMBAR SPINE WITHOUT CONTRAST 11/04/2024 09:15:00 PM TECHNIQUE: CT of the lumbar spine was performed without the administration of intravenous contrast. Multiplanar reformatted images are provided for review. Automated exposure control, iterative reconstruction, and/or weight based adjustment of the mA/kV was utilized to reduce the radiation dose to as low as reasonably achievable. COMPARISON: None available. CLINICAL HISTORY: FALL WITH BACK PAIN FINDINGS: BONES AND ALIGNMENT: Interbody spacers are present at L3 - L4 and L4 - L5. Normal vertebral body heights. No acute fracture or traumatic malalignment. Normal alignment. DEGENERATIVE CHANGES: Mild multilevel spondylosis. Moderate multilevel facet arthropathy. No severe spinal canal narrowing. SOFT TISSUES: Findings in the abdomen and pelvis reported separately on same day CT abdomen and pelvis. IMPRESSION: 1. No acute fracture or traumatic malalignment. Electronically signed by: Norman Gatlin MD 11/04/2024 09:55 PM EST RP Workstation: [id number]   CT CHEST ABDOMEN PELVIS W CONTRAST Result Date: 11/04/2024 CLINICAL DATA:  Syncope, back pain EXAM: CT CHEST, ABDOMEN, AND PELVIS WITH CONTRAST TECHNIQUE: Multidetector CT imaging of the chest, abdomen and pelvis was performed following the standard protocol during bolus administration of intravenous contrast. RADIATION DOSE REDUCTION: This exam was performed according to the departmental dose-optimization program which includes automated exposure control, adjustment of the mA and/or kV according to patient size and/or use of iterative reconstruction technique. CONTRAST:  75mL OMNIPAQUE  IOHEXOL  350 MG/ML SOLN COMPARISON:  09/05/2024 FINDINGS: CT CHEST FINDINGS Cardiovascular: The heart is unremarkable without pericardial effusion. No evidence of acute vascular  injury. No evidence of thoracic aortic aneurysm or dissection. Atherosclerosis of the aorta and coronary vasculature. Mediastinum/Nodes: No enlarged mediastinal, hilar, or axillary lymph nodes. Thyroid gland, trachea, and esophagus demonstrate no significant findings.  Lungs/Pleura: Scattered areas of subsegmental atelectasis are seen at the lung bases. No acute airspace disease, effusion, or pneumothorax. The central airways are patent. Musculoskeletal: There are no acute displaced fractures. Reconstructed images demonstrate no additional findings. CT ABDOMEN PELVIS FINDINGS Hepatobiliary: No hepatic injury or perihepatic hematoma. Gallbladder is unremarkable. Pancreas: Unremarkable. No pancreatic ductal dilatation or surrounding inflammatory changes. Spleen: No splenic injury or perisplenic hematoma. Adrenals/Urinary Tract: No evidence of acute renal injury. Multiple nonobstructing right renal calculi are again noted, largest measuring 11 mm. Stable indeterminate 12 mm hypodense lesion off the lower pole right kidney reference image 87/3. The adrenals and bladder are unremarkable. Stomach/Bowel: No bowel obstruction or ileus. No bowel wall thickening or inflammatory change. Vascular/Lymphatic: Aortic atherosclerosis. No enlarged abdominal or pelvic lymph nodes. Reproductive: Prostate is unremarkable. Other: No free fluid or free intraperitoneal gas. Small fat containing umbilical hernia. No bowel herniation. Musculoskeletal: No acute or destructive bony abnormalities. Stable postsurgical changes within the lower lumbar spine. Reconstructed images demonstrate no additional findings. IMPRESSION: 1. No acute intrathoracic, intra-abdominal, or intrapelvic trauma. 2. Stable indeterminate 12 mm right renal lesion. Nonemergent outpatient dedicated renal CT or MRI with and without contrast is again recommended. 3. Stable nonobstructing right renal calculi. 4.  Aortic Atherosclerosis (ICD10-I70.0). Electronically Signed    By: Ozell Daring M.D.   On: 11/04/2024 20:01   CT Cervical Spine Wo Contrast Result Date: 11/04/2024 CLINICAL DATA:  Clemens, loss of consciousness EXAM: CT CERVICAL SPINE WITHOUT CONTRAST TECHNIQUE: Multidetector CT imaging of the cervical spine was performed without intravenous contrast. Multiplanar CT image reconstructions were also generated. RADIATION DOSE REDUCTION: This exam was performed according to the departmental dose-optimization program which includes automated exposure control, adjustment of the mA and/or kV according to patient size and/or use of iterative reconstruction technique. COMPARISON:  09/05/2024 FINDINGS: Alignment: Stable straightening of the cervical spine due to multilevel degenerative changes. Skull base and vertebrae: No acute fracture. No primary bone lesion or focal pathologic process. Soft tissues and spinal canal: No prevertebral fluid or swelling. No visible canal hematoma. Asymmetric soft tissue prominence at the left base of tongue is again noted, incompletely characterized on this unenhanced exam. Disc levels: Stable multilevel spondylosis and facet hypertrophy, with spondylosis greatest at C3-4 and C5-6. Bony fusion across the C6-7 level. Upper chest: Airway is patent.  Lung apices are clear. Other: Reconstructed images demonstrate no additional findings. IMPRESSION: 1. No acute cervical spine fracture. 2. Stable multilevel cervical degenerative changes. 3. Stable asymmetric soft tissue prominence at the left base of tongue. Underlying infiltrative mass cannot be excluded on this unenhanced exam, and correlation with direct visual inspection is recommended. Electronically Signed   By: Ozell Daring M.D.   On: 11/04/2024 19:55   CT Head Wo Contrast Result Date: 11/04/2024 CLINICAL DATA:  Clemens, loss of consciousness EXAM: CT HEAD WITHOUT CONTRAST TECHNIQUE: Contiguous axial images were obtained from the base of the skull through the vertex without intravenous contrast.  RADIATION DOSE REDUCTION: This exam was performed according to the departmental dose-optimization program which includes automated exposure control, adjustment of the mA and/or kV according to patient size and/or use of iterative reconstruction technique. COMPARISON:  09/05/2024 FINDINGS: Brain: Stable chronic small-vessel ischemic changes within the basal ganglia. No evidence of acute infarct or hemorrhage. The lateral ventricles and remaining midline structures are unremarkable. No acute extra-axial fluid collections. No mass effect. Vascular: No hyperdense vessel or unexpected calcification. Skull: Normal. Negative for fracture or focal lesion. Sinuses/Orbits: Stable postsurgical changes within the left  globe. No acute abnormality. Other: None. IMPRESSION: 1. No acute intracranial process. Electronically Signed   By: Ozell Daring M.D.   On: 11/04/2024 19:51       The results of significant diagnostics from this hospitalization (including imaging, microbiology, ancillary and laboratory) are listed below for reference.     Microbiology: No results found for this or any previous visit (from the past 240 hours).   Labs:  CBC: Recent Labs  Lab 11/04/24 1754 11/05/24 0331 11/06/24 0433  WBC 10.1 7.2 6.3  NEUTROABS 8.4*  --   --   HGB 13.2 11.8* 12.2*  HCT 42.3 37.6* 39.2  MCV 79.2* 79.8* 80.3  PLT 278 234 249   BMP &GFR Recent Labs  Lab 11/04/24 1754 11/05/24 0331 11/06/24 0433  NA 136 139 138  K 4.3 4.1 4.1  CL 96* 104 101  CO2 23 26 24   GLUCOSE 169* 196* 141*  BUN 26* 23 15  CREATININE 1.94* 1.51* 1.16  CALCIUM  9.6 8.2* 8.8*  MG 2.0  --  1.9  PHOS  --   --  2.6   Estimated Creatinine Clearance: 90.5 mL/min (by C-G formula based on SCr of 1.16 mg/dL). Liver & Pancreas: Recent Labs  Lab 11/04/24 1754 11/06/24 0433  AST 44* 35  ALT 33 20  ALKPHOS 146* 128*  BILITOT 0.5 0.4  PROT 7.9 6.9  ALBUMIN  4.4 3.9   No results for input(s): LIPASE, AMYLASE in the last  168 hours. No results for input(s): AMMONIA in the last 168 hours. Diabetic: Recent Labs    11/05/24 0331  HGBA1C 8.4*   Recent Labs  Lab 11/05/24 0812 11/05/24 1255 11/05/24 1738 11/05/24 2029 11/06/24 0817  GLUCAP 201* 176* 163* 124* 117*   Cardiac Enzymes: Recent Labs  Lab 11/05/24 0331 11/06/24 0433  CKTOTAL 578* 593*   No results for input(s): PROBNP in the last 8760 hours. Coagulation Profile: No results for input(s): INR, PROTIME in the last 168 hours. Thyroid Function Tests: Recent Labs    11/04/24 1755  TSH 0.901   Lipid Profile: No results for input(s): CHOL, HDL, LDLCALC, TRIG, CHOLHDL, LDLDIRECT in the last 72 hours. Anemia Panel: No results for input(s): VITAMINB12, FOLATE, FERRITIN, TIBC, IRON, RETICCTPCT in the last 72 hours. Urine analysis:    Component Value Date/Time   COLORURINE STRAW (A) 09/07/2024 1017   APPEARANCEUR CLEAR 09/07/2024 1017   LABSPEC 1.013 09/07/2024 1017   PHURINE 5.0 09/07/2024 1017   GLUCOSEU >=500 (A) 09/07/2024 1017   HGBUR NEGATIVE 09/07/2024 1017   BILIRUBINUR NEGATIVE 09/07/2024 1017   KETONESUR NEGATIVE 09/07/2024 1017   PROTEINUR NEGATIVE 09/07/2024 1017   NITRITE NEGATIVE 09/07/2024 1017   LEUKOCYTESUR NEGATIVE 09/07/2024 1017   Sepsis Labs: Invalid input(s): PROCALCITONIN, LACTICIDVEN   SIGNED:  Darianna Amy T Kanin Lia, MD  [facility] 11/06/2024, 11:38 AM   "
